# Patient Record
Sex: Male | Born: 1955
Health system: Southern US, Community
[De-identification: ages and names within clinical notes are randomized; demographics above are authoritative.]

## PROBLEM LIST (undated history)

## (undated) DIAGNOSIS — I219 Acute myocardial infarction, unspecified: Secondary | ICD-10-CM

## (undated) DIAGNOSIS — K766 Portal hypertension: Secondary | ICD-10-CM

## (undated) DIAGNOSIS — Z955 Presence of coronary angioplasty implant and graft: Secondary | ICD-10-CM

## (undated) DIAGNOSIS — E119 Type 2 diabetes mellitus without complications: Secondary | ICD-10-CM

## (undated) DIAGNOSIS — E785 Hyperlipidemia, unspecified: Secondary | ICD-10-CM

## (undated) DIAGNOSIS — Q211 Atrial septal defect: Secondary | ICD-10-CM

## (undated) DIAGNOSIS — M1990 Unspecified osteoarthritis, unspecified site: Secondary | ICD-10-CM

## (undated) DIAGNOSIS — K649 Unspecified hemorrhoids: Secondary | ICD-10-CM

## (undated) DIAGNOSIS — K7290 Hepatic failure, unspecified without coma: Secondary | ICD-10-CM

## (undated) DIAGNOSIS — K7460 Unspecified cirrhosis of liver: Secondary | ICD-10-CM

## (undated) DIAGNOSIS — K8020 Calculus of gallbladder without cholecystitis without obstruction: Secondary | ICD-10-CM

## (undated) DIAGNOSIS — I8500 Esophageal varices without bleeding: Secondary | ICD-10-CM

## (undated) DIAGNOSIS — K7682 Hepatic encephalopathy: Secondary | ICD-10-CM

## (undated) DIAGNOSIS — I5031 Acute diastolic (congestive) heart failure: Secondary | ICD-10-CM

## (undated) DIAGNOSIS — I471 Supraventricular tachycardia, unspecified: Secondary | ICD-10-CM

## (undated) DIAGNOSIS — Z973 Presence of spectacles and contact lenses: Secondary | ICD-10-CM

## (undated) DIAGNOSIS — I2510 Atherosclerotic heart disease of native coronary artery without angina pectoris: Secondary | ICD-10-CM

## (undated) DIAGNOSIS — S52502A Unspecified fracture of the lower end of left radius, initial encounter for closed fracture: Secondary | ICD-10-CM

## (undated) HISTORY — PX: FRACTURE SURGERY: SHX138

## (undated) HISTORY — DX: Acute diastolic (congestive) heart failure: I50.31

## (undated) HISTORY — DX: Hyperlipidemia, unspecified: E78.5

## (undated) HISTORY — PX: OTHER SURGICAL HISTORY: SHX169

## (undated) HISTORY — DX: Esophageal varices without bleeding: I85.00

## (undated) HISTORY — DX: Calculus of gallbladder without cholecystitis without obstruction: K80.20

## (undated) HISTORY — DX: Atherosclerotic heart disease of native coronary artery without angina pectoris: I25.10

## (undated) HISTORY — DX: Supraventricular tachycardia, unspecified: I47.10

## (undated) HISTORY — DX: Type 2 diabetes mellitus without complications: E11.9

## (undated) HISTORY — DX: Supraventricular tachycardia: I47.1

## (undated) HISTORY — PX: CORONARY ANGIOPLASTY: SHX604

## (undated) HISTORY — PX: APPENDECTOMY: SHX54

## (undated) HISTORY — DX: Unspecified cirrhosis of liver: K74.60

## (undated) HISTORY — DX: Portal hypertension: K76.6

## (undated) HISTORY — DX: Unspecified hemorrhoids: K64.9

---

## 2000-01-21 ENCOUNTER — Encounter: Admission: RE | Admit: 2000-01-21 | Discharge: 2000-03-17 | Payer: Self-pay | Admitting: Specialist

## 2004-12-25 ENCOUNTER — Emergency Department (HOSPITAL_COMMUNITY): Admission: EM | Admit: 2004-12-25 | Discharge: 2004-12-25 | Payer: Self-pay | Admitting: Emergency Medicine

## 2009-07-11 DIAGNOSIS — E119 Type 2 diabetes mellitus without complications: Secondary | ICD-10-CM

## 2009-07-11 DIAGNOSIS — K7460 Unspecified cirrhosis of liver: Secondary | ICD-10-CM

## 2009-07-11 HISTORY — DX: Type 2 diabetes mellitus without complications: E11.9

## 2009-07-11 HISTORY — DX: Unspecified cirrhosis of liver: K74.60

## 2010-06-10 DIAGNOSIS — Z955 Presence of coronary angioplasty implant and graft: Secondary | ICD-10-CM

## 2010-06-10 HISTORY — DX: Presence of coronary angioplasty implant and graft: Z95.5

## 2010-06-30 ENCOUNTER — Inpatient Hospital Stay (HOSPITAL_COMMUNITY)
Admission: EM | Admit: 2010-06-30 | Discharge: 2010-07-19 | Payer: Self-pay | Source: Home / Self Care | Attending: Cardiovascular Disease | Admitting: Cardiovascular Disease

## 2010-06-30 ENCOUNTER — Encounter: Payer: Self-pay | Admitting: Cardiology

## 2010-06-30 HISTORY — PX: CORONARY STENT PLACEMENT: SHX1402

## 2010-07-01 ENCOUNTER — Encounter: Payer: Self-pay | Admitting: Internal Medicine

## 2010-07-01 HISTORY — PX: CARDIAC CATHETERIZATION: SHX172

## 2010-07-03 ENCOUNTER — Encounter: Payer: Self-pay | Admitting: Internal Medicine

## 2010-07-04 ENCOUNTER — Encounter: Payer: Self-pay | Admitting: Internal Medicine

## 2010-07-05 ENCOUNTER — Encounter: Payer: Self-pay | Admitting: Internal Medicine

## 2010-07-06 ENCOUNTER — Encounter: Payer: Self-pay | Admitting: Internal Medicine

## 2010-07-06 ENCOUNTER — Encounter: Payer: Self-pay | Admitting: Gastroenterology

## 2010-07-07 ENCOUNTER — Encounter: Payer: Self-pay | Admitting: Internal Medicine

## 2010-07-09 ENCOUNTER — Encounter: Payer: Self-pay | Admitting: Pulmonary Disease

## 2010-07-10 ENCOUNTER — Encounter: Payer: Self-pay | Admitting: Internal Medicine

## 2010-07-11 ENCOUNTER — Encounter: Payer: Self-pay | Admitting: Internal Medicine

## 2010-07-12 ENCOUNTER — Encounter: Payer: Self-pay | Admitting: Internal Medicine

## 2010-07-12 ENCOUNTER — Encounter (INDEPENDENT_AMBULATORY_CARE_PROVIDER_SITE_OTHER): Payer: Self-pay | Admitting: *Deleted

## 2010-07-13 ENCOUNTER — Encounter: Payer: Self-pay | Admitting: Internal Medicine

## 2010-07-14 LAB — CBC
HCT: 34 % — ABNORMAL LOW (ref 39.0–52.0)
Hemoglobin: 10.4 g/dL — ABNORMAL LOW (ref 13.0–17.0)
MCH: 30.3 pg (ref 26.0–34.0)
MCHC: 30.6 g/dL (ref 30.0–36.0)
MCV: 99.1 fL (ref 78.0–100.0)
Platelets: 143 10*3/uL — ABNORMAL LOW (ref 150–400)
RBC: 3.43 MIL/uL — ABNORMAL LOW (ref 4.22–5.81)
RDW: 16.3 % — ABNORMAL HIGH (ref 11.5–15.5)
WBC: 12.6 10*3/uL — ABNORMAL HIGH (ref 4.0–10.5)

## 2010-07-14 LAB — GLUCOSE, CAPILLARY
Glucose-Capillary: 174 mg/dL — ABNORMAL HIGH (ref 70–99)
Glucose-Capillary: 223 mg/dL — ABNORMAL HIGH (ref 70–99)
Glucose-Capillary: 185 mg/dL — ABNORMAL HIGH (ref 70–99)
Glucose-Capillary: 171 mg/dL — ABNORMAL HIGH (ref 70–99)

## 2010-07-15 LAB — CBC
HCT: 29.2 % — ABNORMAL LOW (ref 39.0–52.0)
Hemoglobin: 9.2 g/dL — ABNORMAL LOW (ref 13.0–17.0)
MCH: 30.9 pg (ref 26.0–34.0)
MCHC: 31.5 g/dL (ref 30.0–36.0)
MCV: 98 fL (ref 78.0–100.0)
Platelets: 95 10*3/uL — ABNORMAL LOW (ref 150–400)
RBC: 2.98 MIL/uL — ABNORMAL LOW (ref 4.22–5.81)
RDW: 16.3 % — ABNORMAL HIGH (ref 11.5–15.5)
WBC: 7 10*3/uL (ref 4.0–10.5)

## 2010-07-15 LAB — BASIC METABOLIC PANEL
BUN: 12 mg/dL (ref 6–23)
BUN: 10 mg/dL (ref 6–23)
CO2: 24 mEq/L (ref 19–32)
CO2: 25 mEq/L (ref 19–32)
Calcium: 8.1 mg/dL — ABNORMAL LOW (ref 8.4–10.5)
Calcium: 7.8 mg/dL — ABNORMAL LOW (ref 8.4–10.5)
Chloride: 117 mEq/L — ABNORMAL HIGH (ref 96–112)
Chloride: 119 mEq/L — ABNORMAL HIGH (ref 96–112)
Creatinine, Ser: 0.69 mg/dL (ref 0.4–1.5)
Creatinine, Ser: 0.75 mg/dL (ref 0.4–1.5)
GFR calc Af Amer: >60 mL/min (ref >60)
GFR calc Af Amer: >60 mL/min (ref >60)
GFR calc non Af Amer: >60 mL/min (ref >60)
GFR calc non Af Amer: >60 mL/min (ref >60)
Glucose, Bld: 161 mg/dL — ABNORMAL HIGH (ref 70–99)
Glucose, Bld: 164 mg/dL — ABNORMAL HIGH (ref 70–99)
Potassium: 3.9 mEq/L (ref 3.5–5.1)
Potassium: 3.3 mEq/L — ABNORMAL LOW (ref 3.5–5.1)
Sodium: 147 mEq/L — ABNORMAL HIGH (ref 135–145)
Sodium: 147 mEq/L — ABNORMAL HIGH (ref 135–145)

## 2010-07-15 LAB — DIFFERENTIAL
Basophils Absolute: 0.1 10*3/uL (ref 0.0–0.1)
Basophils Relative: 1 % (ref 0–1)
Eosinophils Absolute: 0.2 10*3/uL (ref 0.0–0.7)
Eosinophils Relative: 3 % (ref 0–5)
Lymphocytes Relative: 22 % (ref 12–46)
Lymphs Abs: 1.6 10*3/uL (ref 0.7–4.0)
Monocytes Absolute: 0.8 10*3/uL (ref 0.1–1.0)
Monocytes Relative: 11 % (ref 3–12)
Neutro Abs: 4.4 10*3/uL (ref 1.7–7.7)
Neutrophils Relative %: 63 % (ref 43–77)

## 2010-07-15 LAB — GLUCOSE, CAPILLARY
Glucose-Capillary: 155 mg/dL — ABNORMAL HIGH (ref 70–99)
Glucose-Capillary: 147 mg/dL — ABNORMAL HIGH (ref 70–99)
Glucose-Capillary: 152 mg/dL — ABNORMAL HIGH (ref 70–99)

## 2010-07-16 LAB — BASIC METABOLIC PANEL
BUN: 9 mg/dL (ref 6–23)
CO2: 24 mEq/L (ref 19–32)
Calcium: 8 mg/dL — ABNORMAL LOW (ref 8.4–10.5)
Chloride: 112 mEq/L (ref 96–112)
Creatinine, Ser: 0.84 mg/dL (ref 0.4–1.5)
GFR calc Af Amer: >60 mL/min (ref >60)
GFR calc non Af Amer: >60 mL/min (ref >60)
Glucose, Bld: 153 mg/dL — ABNORMAL HIGH (ref 70–99)
Potassium: 3.9 mEq/L (ref 3.5–5.1)
Sodium: 143 mEq/L (ref 135–145)

## 2010-07-16 LAB — GLUCOSE, CAPILLARY
Glucose-Capillary: 147 mg/dL — ABNORMAL HIGH (ref 70–99)
Glucose-Capillary: 150 mg/dL — ABNORMAL HIGH (ref 70–99)

## 2010-07-16 LAB — CBC
HCT: 31.3 % — ABNORMAL LOW (ref 39.0–52.0)
Hemoglobin: 9.7 g/dL — ABNORMAL LOW (ref 13.0–17.0)
MCH: 30.5 pg (ref 26.0–34.0)
MCHC: 31 g/dL (ref 30.0–36.0)
MCV: 98.4 fL (ref 78.0–100.0)
Platelets: 104 10*3/uL — ABNORMAL LOW (ref 150–400)
RBC: 3.18 MIL/uL — ABNORMAL LOW (ref 4.22–5.81)
RDW: 16.2 % — ABNORMAL HIGH (ref 11.5–15.5)
WBC: 8.1 10*3/uL (ref 4.0–10.5)

## 2010-07-16 LAB — AMMONIA: Ammonia: 31 umol/L (ref 11–35)

## 2010-07-19 ENCOUNTER — Encounter (INDEPENDENT_AMBULATORY_CARE_PROVIDER_SITE_OTHER): Payer: Self-pay

## 2010-07-19 ENCOUNTER — Encounter (INDEPENDENT_AMBULATORY_CARE_PROVIDER_SITE_OTHER): Payer: Self-pay | Admitting: *Deleted

## 2010-07-26 LAB — GLUCOSE, CAPILLARY
Glucose-Capillary: 169 mg/dL — ABNORMAL HIGH (ref 70–99)
Glucose-Capillary: 133 mg/dL — ABNORMAL HIGH (ref 70–99)
Glucose-Capillary: 124 mg/dL — ABNORMAL HIGH (ref 70–99)
Glucose-Capillary: 125 mg/dL — ABNORMAL HIGH (ref 70–99)
Glucose-Capillary: 178 mg/dL — ABNORMAL HIGH (ref 70–99)
Glucose-Capillary: 204 mg/dL — ABNORMAL HIGH (ref 70–99)
Glucose-Capillary: 165 mg/dL — ABNORMAL HIGH (ref 70–99)
Glucose-Capillary: 149 mg/dL — ABNORMAL HIGH (ref 70–99)
Glucose-Capillary: 160 mg/dL — ABNORMAL HIGH (ref 70–99)
Glucose-Capillary: 184 mg/dL — ABNORMAL HIGH (ref 70–99)
Glucose-Capillary: 152 mg/dL — ABNORMAL HIGH (ref 70–99)
Glucose-Capillary: 162 mg/dL — ABNORMAL HIGH (ref 70–99)
Glucose-Capillary: 152 mg/dL — ABNORMAL HIGH (ref 70–99)

## 2010-07-26 LAB — CBC
HCT: 30.6 % — ABNORMAL LOW (ref 39.0–52.0)
HCT: 28.1 % — ABNORMAL LOW (ref 39.0–52.0)
Hemoglobin: 9.5 g/dL — ABNORMAL LOW (ref 13.0–17.0)
Hemoglobin: 9 g/dL — ABNORMAL LOW (ref 13.0–17.0)
MCH: 30.2 pg (ref 26.0–34.0)
MCH: 30.6 pg (ref 26.0–34.0)
MCHC: 31 g/dL (ref 30.0–36.0)
MCHC: 32 g/dL (ref 30.0–36.0)
MCV: 97.1 fL (ref 78.0–100.0)
MCV: 95.6 fL (ref 78.0–100.0)
Platelets: 111 10*3/uL — ABNORMAL LOW (ref 150–400)
Platelets: 100 10*3/uL — ABNORMAL LOW (ref 150–400)
RBC: 3.15 MIL/uL — ABNORMAL LOW (ref 4.22–5.81)
RBC: 2.94 MIL/uL — ABNORMAL LOW (ref 4.22–5.81)
RDW: 15.9 % — ABNORMAL HIGH (ref 11.5–15.5)
RDW: 15.1 % (ref 11.5–15.5)
WBC: 7.7 10*3/uL (ref 4.0–10.5)
WBC: 4.2 10*3/uL (ref 4.0–10.5)

## 2010-07-26 LAB — BASIC METABOLIC PANEL
BUN: 9 mg/dL (ref 6–23)
BUN: 7 mg/dL (ref 6–23)
BUN: 4 mg/dL — ABNORMAL LOW (ref 6–23)
CO2: 23 mEq/L (ref 19–32)
CO2: 22 mEq/L (ref 19–32)
CO2: 25 mEq/L (ref 19–32)
Calcium: 7.7 mg/dL — ABNORMAL LOW (ref 8.4–10.5)
Calcium: 8 mg/dL — ABNORMAL LOW (ref 8.4–10.5)
Calcium: 7.9 mg/dL — ABNORMAL LOW (ref 8.4–10.5)
Chloride: 107 mEq/L (ref 96–112)
Chloride: 111 mEq/L (ref 96–112)
Chloride: 107 mEq/L (ref 96–112)
Creatinine, Ser: 0.8 mg/dL (ref 0.4–1.5)
Creatinine, Ser: 0.69 mg/dL (ref 0.4–1.5)
Creatinine, Ser: 0.75 mg/dL (ref 0.4–1.5)
GFR calc Af Amer: >60 mL/min (ref >60)
GFR calc Af Amer: >60 mL/min (ref >60)
GFR calc Af Amer: >60 mL/min (ref >60)
GFR calc non Af Amer: >60 mL/min (ref >60)
GFR calc non Af Amer: >60 mL/min (ref >60)
GFR calc non Af Amer: >60 mL/min (ref >60)
Glucose, Bld: 135 mg/dL — ABNORMAL HIGH (ref 70–99)
Glucose, Bld: 169 mg/dL — ABNORMAL HIGH (ref 70–99)
Glucose, Bld: 153 mg/dL — ABNORMAL HIGH (ref 70–99)
Potassium: 3.9 mEq/L (ref 3.5–5.1)
Potassium: 3.8 mEq/L (ref 3.5–5.1)
Potassium: 3.9 mEq/L (ref 3.5–5.1)
Sodium: 136 mEq/L (ref 135–145)
Sodium: 141 mEq/L (ref 135–145)
Sodium: 138 mEq/L (ref 135–145)

## 2010-08-02 ENCOUNTER — Ambulatory Visit
Admission: RE | Admit: 2010-08-02 | Discharge: 2010-08-02 | Payer: Self-pay | Source: Home / Self Care | Attending: Cardiology | Admitting: Cardiology

## 2010-08-02 ENCOUNTER — Encounter: Payer: Self-pay | Admitting: Cardiology

## 2010-08-02 DIAGNOSIS — E785 Hyperlipidemia, unspecified: Secondary | ICD-10-CM | POA: Insufficient documentation

## 2010-08-02 DIAGNOSIS — I2510 Atherosclerotic heart disease of native coronary artery without angina pectoris: Secondary | ICD-10-CM | POA: Insufficient documentation

## 2010-08-02 DIAGNOSIS — I5032 Chronic diastolic (congestive) heart failure: Secondary | ICD-10-CM | POA: Insufficient documentation

## 2010-08-03 ENCOUNTER — Encounter: Payer: Self-pay | Admitting: Cardiology

## 2010-08-04 ENCOUNTER — Encounter: Admission: RE | Admit: 2010-08-04 | Payer: Self-pay | Source: Home / Self Care | Admitting: Dermatopathology

## 2010-08-04 LAB — CONVERTED CEMR LAB
AST: 33 units/L (ref 0–37)
Albumin: 3.5 g/dL (ref 3.5–5.2)
Alkaline Phosphatase: 129 units/L — ABNORMAL HIGH (ref 39–117)
Bilirubin, Direct: 0.1 mg/dL (ref 0.0–0.3)
CO2: 22 meq/L (ref 19–32)
Calcium: 9.3 mg/dL (ref 8.4–10.5)
Chloride: 97 meq/L (ref 96–112)
Creatinine, Ser: 0.83 mg/dL (ref 0.40–1.50)
Indirect Bilirubin: 0.5 mg/dL (ref 0.0–0.9)
Sodium: 130 meq/L — ABNORMAL LOW (ref 135–145)
Total Bilirubin: 0.6 mg/dL (ref 0.3–1.2)

## 2010-08-06 NOTE — H&P (Signed)
Adam Butler, Adam Butler             ACCOUNT NO.:  [id number]  MEDICAL RECORD NO.:  [id number]          PATIENT TYPE:  INP  LOCATION:  2912                         FACILITY:  [facility]  PHYSICIAN:  Kathlyn Sacramento, MD     DATE OF BIRTH:  1956/03/13  DATE OF ADMISSION:  06/30/2010 DATE OF DISCHARGE:                             HISTORY & PHYSICAL   PRIMARY CARDIOLOGIST:  New patient to [facility].  PRIMARY CARE PHYSICIAN:  None.  CHIEF COMPLAINT:  Chest pain.  HISTORY OF PRESENT ILLNESS:  This is a 55 year old Caucasian gentleman with no known medical history or no primary care physician who states he was in his usual state of health until last evening when he began to have fatigue, chills, and cough.  He also felt weakness.  This morning around 8:00 a.m., the patient had an additional symptom of left-sided chest pain.  He presented to the emergency department with his wife where he was found to be tachycardic with heart rates in the 150s.  An EKG was obtained that showed inferior ST elevations.  A code STEMI was initiated and the patient was brought emergently to the cath lab.  PAST MEDICAL HISTORY:  None.  PAST SURGICAL HISTORY: 1. Right leg with titanium rod after a fall. 2. Status post appendectomy.  SOCIAL HISTORY:  The patient lives with his wife.  He is currently unemployed.  He has greater than 45-pack-year smoking history and continues to smoke 1 pack per day.  He has an occasional alcohol use. He denies any illicit drug use.  He has no regular exercise program.  FAMILY HISTORY:  Pertinent for early coronary artery disease in his two brothers, both in their 2s.  His father also has coronary artery disease and status post coronary artery bypass grafting but this is later in life.  ALLERGIES:  No known drug allergies.  HOME MEDICATIONS: 1. Aspirin. 2. Multivitamin. 3. Potassium for leg cramps.  REVIEW OF SYSTEMS:  All pertinent positives as stated in HPI.  The patient  denies any nausea or vomiting.  All other systems have been reviewed and are negative.  CODE STATUS:  Full.  PHYSICAL EXAMINATION:  VITAL SIGNS:  Temperature 97.3, pulse 92, respirations 20, and blood pressure 132/121. GENERAL:  This is an anxious-appearing gentleman.  He does appear older than his stated age.  He is mildly tachypneic with conversation. HEENT:  Normal. NECK:  Supple. HEART:  Regular rate and rhythm with S1-S2. LUNGS:  Clear to auscultation anteriorly. ABDOMEN:  Soft, nontender, positive bowel sounds x4. EXTREMITIES:  No clubbing, cyanosis, or edema. MUSCULOSKELETAL:  No joint deformities. NEURO:  Alert and oriented x3, cranial nerves II through XII grossly intact.  IMAGING:  Chest x-ray is pending.  EKG showing normal sinus rhythm at a rate of 88 beats per minute.  There are inferior ST elevations as well as V4-V6.  AVL, V1 and V2 with ST depressions.  WBC 25.5, hemoglobin 13.3, hematocrit 39, and platelet 216.  Sodium 135, potassium 5.1, chloride 103, bicarb 71, BUN 20, creatinine 0.85, and glucose 401.  Total bilirubin 1.6, alkaline phosphatase 105, AST 46, ALT 229, total protein 6.5,  and albumin 2.8. Point-of-care markers negative x1.  ASSESSMENT AND PLAN:  This is a 55 year old gentleman with no known medical history who presents with chest pain and was found to have ST elevations on his EKG.  A code STEMI has been initiated and the patient has been taken emergently to the cath lab by Dr. Fletcher Anon.  Informed consent was obtained.  The patient has also been involved in the [facility] [place] trial.  Upon catheterization, the patient became hypotensive and he has received fluids.  Therefore, antihypertensive medicines will be held until this resolves.  The patient has been found to have elevated glucose, greater than 400.  Therefore, the patient will be placed on an insulin drip after catheterization.  He is to be taken to the ICU for routine monitoring.  With  the patient's elevated white blood cell count, we will also obtain urine cultures and a chest x-ray.  The patient will be placed on a statin and fasting lipids will be drawn in the morning.     Adam Amsterdam, PA-C   ______________________________ Kathlyn Sacramento, MD    NB/MEDQ  D:  06/30/2010  T:  07/01/2010  Job:  [id number]  Electronically Signed by Pennie Rushing P.A. on 07/23/2010 10:19:09 AM Electronically Signed by Kathlyn Sacramento MD on 08/04/2010 10:41:33 AM

## 2010-08-09 ENCOUNTER — Telehealth: Payer: Self-pay | Admitting: Cardiology

## 2010-08-12 NOTE — Miscellaneous (Signed)
Summary: Appointment  Clinical Lists Changes Appointment made for patient for hospital f/u with Sarah PA for 08/16/10@2 :15pm.  Appended Document: Appointment Appt with Dr. Deatra Ina, appointment scheduled with Judson Roch PA.

## 2010-08-12 NOTE — Assessment & Plan Note (Signed)
Summary: F/U FROM HOSPITAL/SAB   Visit Type:  Initial Consult Primary Provider:  Sami Hassan,M.D.  [facility]  CC:  s/p stent at [facility].  "Doing well since the discharge"..  History of Present Illness: 55 yo with recent complicated admission to [facility].  He entered the hospital with an inferior MI and had a bare metal stent to an occluded circumflex.  His hospitalization was then complicated by GI bleeding requiring transfusion and altered mental status thought to be due to hepatic encephalopathy.  He was intubated due to altered mental status to protect his airway.  He developed ventilator-associated PNA and CHF, requiring antibiotics and diuresis.  He was noted to have findings consistent with cirrhosis on imaging, but family/patient denied ETOH and viral hepatitis workup was unrevealing.  He was finally discharged home after a prolonged course.    At home, patient seems to be doing quite well.  He has had no recurrent chest pain. No exertional dyspnea.  He is able to climb a flight of steps with no problem.  No melena or bright red blood per rectum.  His lower extremity edema has resolved and he has no abdominal distention.  He has quit smoking.  He is going to see Dr. Deatra Ina for GI followup in early February.  Per his wife, mental status has been quite clear since discharge.  He is still taking lactulose.   ECG: NSR, LAD, old inferior MI.   Labs (12/11): HBsAg negative, HAV ab negative, HCV ab negative, HBcAb negative, HVC RNA negative, ceruloplasmin level normal, ANA negative, anti-mitochondrial ab level elevated.  Labs (1/12): K 3.9, creatinine 0.75, HCT 28.1, AST 40, ALT 29 B  Preventive Screening-Counseling & Management  Alcohol-Tobacco     Smoking Status: quit  Caffeine-Diet-Exercise     Does Patient Exercise: yes  Current Medications (verified): 1)  Aspir-Low 81 Mg Tbec (Aspirin) .Marland Kitchen.. 1 Tablet Daily 2)  Plavix 75 Mg Tabs (Clopidogrel Bisulfate) .Marland Kitchen.. 1  Tablet Daily 3)  Furosemide 40 Mg Tabs (Furosemide) .Marland Kitchen.. 1 Tablet Daily 4)  Insulin .... As Directed 5)  Ipratropium-Albuterol 0.5-2.5 (3) Mg/72m Soln (Ipratropium-Albuterol) ..Marland Kitchen. 1 Puff Inhaled Every 2 Hours As Needed 6)  Lactulose 10 Gm/167mSoln (Lactulose) .... By Mouth Daily 7)  Nitrostat 0.4 Mg Subl (Nitroglycerin) .... As Directed 8)  Nadolol 20 Mg Tabs (Nadolol) .... 1/2 Tablet By Mouth Daily 9)  Protonix 40 Mg Tbec (Pantoprazole Sodium) ...Marland Kitchen 1 Tablet By Mouth Daily 10)  Spironolactone 50 Mg Tabs (Spironolactone) ...Marland Kitchen 1 Tablet By Mouth Two Times A Day 11)  Multivitamins  Tabs (Multiple Vitamin) ...Marland Kitchen 1 Tablet Daily 12)  Metformin Hcl 500 Mg Tabs (Metformin Hcl) .... One Tablet  Two Times A Day  Allergies (verified): No Known Drug Allergies  Past History:  Past Surgical History: Last updated: 01Feb 08, 2012eart Catheterization-07/01/2010  Family History: Last updated: 01Feb 08, 2012ather:Deceased age 6374CABG age 2536Mother:Living. Siblings: 3 brothers & 1 sister living. 1 brother MI age 55 1rother MI age 3326Social History: Last updated: 012012-02-08nemployed, worked in maintenance prior.  Married  Tobacco Use - Former. Quit Dec. 21, 2011. Smoked x 36 years. Alcohol Use - no Regular Exercise - yes--walk  Risk Factors: Exercise: yes (101-02-19 Risk Factors: Smoking Status: quit (101/02/19 Past Medical History: 1. CAD: Inferior MI 12/11.  LHC with occluded mid CFX and 80% proximal RCA.  EF 55%.  He had 3.0 x 28 VIsion BMS to CFX.  2. Diastolic CHF: Echo (  12/11) with EF 50-55% and mild LVH.  EF 55% by LV-gram in 12/11.  3. Hypertension 4. Type II diabetes 5. Cirrhosis: Cryptogenic, ? NASH.  Family and patient deny ETOH.  HCV, HBV, and HAV workup negative.  ANA negative.  AMA positive.  He had grade II esophageal varices on EGD with no evidence for bleeding in 12/11.  He had hepatic encephalopaty and ascites in 12/11.  6. SVT (1/12): appeared to be an ectopic  atrial tachycardia.  Required DCCV with hemodynamic instability.  7. GI bleed (12/11): Etiology not clearly defined.  EGD showed nonbleeding esophageal varices.   Family History: Reviewed history and no changes required. Father:Deceased age 20. CABG age 48. Mother:Living. Siblings: 3 brothers & 1 sister living. 1 brother MI age 7 1 brother MI age 24  Social History: Unemployed, worked in Theatre manager prior.  Married  Tobacco Use - Former. Quit Dec. 21, 2011. Smoked x 36 years. Alcohol Use - no Regular Exercise - yes--walk Smoking Status:  quit Does Patient Exercise:  yes  Review of Systems       All systems reviewed and negative except as per HPI.   Vital Signs:  Patient profile:   55 year old male Height:      67 inches Weight:      211 pounds BMI:     33.17 Pulse rate:   69 / minute BP sitting:   128 / 80  (left arm) Cuff size:   regular  Vitals Entered By: Dolores Lory, CMA (August 02, 2010 2:55 PM)  Physical Exam  General:  Well developed, well nourished, in no acute distress. Head:  normocephalic and atraumatic Nose:  no deformity, discharge, inflammation, or lesions Mouth:  Teeth, gums and palate normal. Oral mucosa normal. Neck:  Neck supple, no JVD. No masses, thyromegaly or abnormal cervical nodes. Lungs:  Breath sounds are slightly distant.  Heart:  Non-displaced PMI, chest non-tender; regular rate and rhythm, S1, S2 without murmurs, rubs or gallops. Carotid upstroke normal, no bruit.  Pedals normal pulses. No edema, no varicosities. Abdomen:  Bowel sounds positive; abdomen soft and non-tender without masses, organomegaly, or hernias noted. No hepatosplenomegaly. No distention.  Extremities:  No clubbing or cyanosis. Neurologic:  Alert and oriented x 3. Skin:  Intact without lesions or rashes. Psych:  Normal affect.   Impression & Recommendations:  Problem # 1:  CAD, NATIVE VESSEL (ICD-414.01) Stable with no exertional chest pain or dyspnea.  EF was  preserved on echo.  No overt bleeding since hospital discharge.  He needs to remain on Plavix for at least a month post-BMS.  Ideally, he would continue on it for a year.  I will have to see how he does on it.  He had a residual 80% RCA stenosis that was untreated.  I will arrange for an ETT-myoview in about 2 weeks to assess for RCA-territory ischemia.  He is uninsured, so I will attempt to get him coverage for this through the [facility] care provision.  He will also continue on ASA 81 mg daily, nadolol (nonspecific beta blocker given varices).  I am going to start him on a low dose of statin (pravastatin 20 mg daily) and follow LFTs closely. I am also going to start him on a low dose of ACEI for secondary prevention. I am going to set him up for cardiac rehab, he wishes to do this at [facility].   Problem # 2:  DIASTOLIC HEART FAILURE, CHRONIC (ICD-428.32) Preserved LV systolic  function post-MI.  He had significant volume overload with some ascites while in the hospital.  He is now on Lasix and spironolactone.  He does not have any pedal edema or significant abdominal distention.  Neck veins are not elevated.  Continue current Lasix and spironolactone for now.  Will get BMET.    Problem # 3:  HYPERLIPIDEMIA-MIXED (ICD-272.4) I will start a low dose of statin for secondary prevention of MI (pravastatin 20 mg daily).  I will follow LFTs closely. Check LFTs today and in 1 month.  Check lipids/LFTs in 2 months.  If stable with this dose of pravastatin, will increase dose.    Problem # 4:  CIRRHOSIS Cryptogenic.  ? significance of positive anti-mitochondrial antibody.  He is to followup with GI soon.   Will need to follow him closely, will see in 1 month.   Other Orders: T-Basic Metabolic Panel ([id number]) T-Hepatic Function [phone])  Patient Instructions: 1)  Your physician recommends that you schedule a follow-up appointment in: 1 month  2)  TO BE SCHEDULED IN 2 MONTHS:  Your  physician recommends that you return for a FASTING lipid profile: (LIVER, LIPID) 3)  Your physician has recommended you make the following change in your medication: START Lisinopril 2.53m once daily. START Pravachol 247monce daily. 4)  Your physician has requested that you have an exercise stress myoview.  For further information please visit [email or web address] Please follow instruction sheet, as given. We will call you to schedule this. 5)  TO BE SCHEDULED IN 1 MONTH:  Your physician recommends that you return for lab work in: (LFT only) Prescriptions: PRAVACHOL 20 MG TABS (PRAVASTATIN SODIUM) Take one tablet by mouth once daily.  #30 x 6   Entered by:   MeDarlyne RussianN   Authorized by:   DaLoralie ChampagneMD   Signed by:   MeDarlyne RussianN on 08/02/2010   Method used:   [facility]        [phone]       [place]  [id number]     Ph: [phone]     Fax: [phone] RxID: :   [id number] 2.5 MG TABS (LISINOPRIL) Take one tablet by mouth daily  #30 x 6   Entered by:   MeDarlyne RussianN   Authorized by:   DaLoralie ChampagneMD   Signed by:   MeDarlyne RussianN on 08/02/2010   Method used:   [facility]        [phone]([facility]ail)       [address]     [place]  [id number]     Ph: [phone]     Fax: [phone] RxID:   [id number]

## 2010-08-12 NOTE — Letter (Signed)
Summary: Cardiac Rehab  Program  Cardiac Rehab  Program   Imported By: Marilynne Drivers 08/03/2010 09:17:03  _____________________________________________________________________  External Attachment:    Type:   Image     Comment:   External Document

## 2010-08-16 ENCOUNTER — Other Ambulatory Visit: Payer: Self-pay

## 2010-08-16 ENCOUNTER — Encounter (INDEPENDENT_AMBULATORY_CARE_PROVIDER_SITE_OTHER): Payer: Self-pay | Admitting: *Deleted

## 2010-08-16 ENCOUNTER — Ambulatory Visit (INDEPENDENT_AMBULATORY_CARE_PROVIDER_SITE_OTHER): Payer: Self-pay | Admitting: Gastroenterology

## 2010-08-16 ENCOUNTER — Other Ambulatory Visit: Payer: Self-pay | Admitting: Gastroenterology

## 2010-08-16 ENCOUNTER — Encounter: Payer: Self-pay | Admitting: Gastroenterology

## 2010-08-16 DIAGNOSIS — I8500 Esophageal varices without bleeding: Secondary | ICD-10-CM | POA: Insufficient documentation

## 2010-08-16 DIAGNOSIS — K7460 Unspecified cirrhosis of liver: Secondary | ICD-10-CM

## 2010-08-16 LAB — COMPREHENSIVE METABOLIC PANEL
ALT: 34 U/L (ref 0–53)
AST: 41 U/L — ABNORMAL HIGH (ref 0–37)
Albumin: 3.4 g/dL — ABNORMAL LOW (ref 3.5–5.2)
Creatinine, Ser: 0.9 mg/dL (ref 0.4–1.5)
Potassium: 4.6 mEq/L (ref 3.5–5.1)

## 2010-08-16 LAB — CBC WITH DIFFERENTIAL/PLATELET
Basophils Absolute: 0 10*3/uL (ref 0.0–0.1)
Basophils Relative: 0.8 % (ref 0.0–3.0)
Eosinophils Absolute: 0.2 10*3/uL (ref 0.0–0.7)
Eosinophils Relative: 2.7 % (ref 0.0–5.0)
Lymphocytes Relative: 30.1 % (ref 12.0–46.0)
Lymphs Abs: 1.7 10*3/uL (ref 0.7–4.0)
Monocytes Absolute: 0.6 10*3/uL (ref 0.1–1.0)
Monocytes Relative: 10.7 % (ref 3.0–12.0)
Neutro Abs: 3.2 10*3/uL (ref 1.4–7.7)
Neutrophils Relative %: 55.7 % (ref 43.0–77.0)
RDW: 16.5 % — ABNORMAL HIGH (ref 11.5–14.6)

## 2010-08-16 LAB — FOLATE: Folate: >24.8 ng/mL (ref >5.9)

## 2010-08-16 LAB — IRON: Iron: 74 ug/dL (ref 42–165)

## 2010-08-16 LAB — IBC PANEL
Saturation Ratios: 16.8 % — ABNORMAL LOW (ref 20.0–50.0)
Transferrin: 314.5 mg/dL (ref 212.0–360.0)

## 2010-08-16 LAB — PROTIME-INR
INR: 1.1 ratio — ABNORMAL HIGH (ref 0.8–1.0)
Prothrombin Time: 12.2 s (ref 10.2–12.4)

## 2010-08-16 LAB — FERRITIN: Ferritin: 18.7 ng/mL — ABNORMAL LOW (ref 22.0–322.0)

## 2010-08-18 ENCOUNTER — Telehealth: Payer: Self-pay | Admitting: Cardiology

## 2010-08-18 NOTE — Progress Notes (Signed)
Summary: Plavix  Phone Note Call from Patient Call back at [phone]   Caller: Patient Call For: Nurse/Plavix Summary of Call: Pt called stating he has 1 tablet left of Plavix from our samples. Pt has sent in pt assistance form for Plavix and they will be shipped to our office in 10 - 14 business days. Called rep to see if she can bring in samples of Plavix and she will be in tomorrow at 10. Initial call taken by: Rodman Comp CMA,  August 09, 2010 8:40 AM  Follow-up for Phone Call        pt's wife notified per [facility]. Follow-up by: Clarise Cruz Burress CMA,  August 09, 2010 8:40 AM

## 2010-08-26 ENCOUNTER — Encounter: Payer: Self-pay | Admitting: Gastroenterology

## 2010-08-26 LAB — CONVERTED CEMR LAB
Hep B S Ab: NEGATIVE
Hepatitis B Surface Ag: NEGATIVE

## 2010-08-26 NOTE — Progress Notes (Signed)
Summary: Pt assistance   ---- Converted from flag ---- ---- 08/03/2010 10:01 AM, Darlyne Russian RN wrote: Mailed pt assistance forms to pt 08/03/10. Pt will fill out and mail to [facility], when I hear results will schedule Treadmill Myoview ordered by Dr. Aundra Dubin. Follow up on status. ------------------------------  Spoke to pt, they have received pt assistance forms for [facility] and will notify us when they have been sent in and will f/u on status. Once approved, will schedule pt for exercise myoview per Dr. Aundra Dubin. Pt has Plavix assistance forms and will send in.

## 2010-08-26 NOTE — Op Note (Signed)
Summary: EGD (Dr Olevia Perches)  [address] [facility] [place], [place] [address] ENDOSCOPY PROCEDURE REPORT PATIENT: Adam Butler, Adam Butler MR#: [id number] BIRTHDATE: 17-Jan-1956 GENDER: male ENDOSCOPIST: Lowella Bandy. Olevia Perches, MD Referred by: Dola Argyle, M.D. PROCEDURE DATE: 07/06/2010 PROCEDURE: EGD, diagnostic 43235 ASA CLASS: Class III INDICATIONS: anemia, melenic bleeding dark liquid stools today, Hgb 8.3, pt on Plavix, no prior GI history, s/p small bowl resection as a child MEDICATIONS: None mg IV TOPICAL ANESTHETIC: none DESCRIPTION OF PROCEDURE: After the risks benefits and alternatives of the procedure were thoroughly explained, informed consent was obtained. The Pentax Gastroscope [id number] endoscope was introduced through the mouth and advanced to the second portion of the duodenum, without limitations. The instrument was slowly withdrawn as the mucosa was fully examined. [place] [address] [place], [place] [address] Grade II varices were found in the distal esophagus (see image1, image2, image8, image7, image9, image10, image11, and image12). large varices, 3 strands, no stigmata of recent bleeding Gastropathy was found (see image6 and image3). portal hypertensive gastropathy, no bleeding no gastric varices Otherwise the examination was normal (see image4 and image5). normal duodenum, no varices, no blood in the UGI tract Retroflexed views revealed no abnormalities. The scope was then withdrawn from the patient and the procedure completed. COMPLICATIONS: None Page 2 of 3 [place], [place], [phone] [address] [facility] [place], [place] [address] ENDOSCOPIC IMPRESSION: 1) Grade II varices in the distal esophagus 2) Gastropathy 3) Otherwise normal examination no active or recent bleeding from UGI tract RECOMMENDATIONS: assume upper GI bleed, consider tagged RBC pool scan REPEAT EXAM: In 0 year(s) for. may need varuiceal  banding in case of an UGI bleed _______________________________ Lowella Bandy. Olevia Perches, MD CC: n. eSIGNED: Lowella Bandy. Brodie at 07/06/2010 05:44 PM Page 3 of [place], [phone]

## 2010-08-26 NOTE — Assessment & Plan Note (Addendum)
Summary: HOSPITAL F/U CIRRHOSIS/LRH   NO SHOW/CPAY    SCHED W SARAH-PA   History of Present Illness Primary GI MD: Erskine Emery MD [facility] Primary Provider: Sami Hassan,M.D.  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 Pt denies any abd pain but has intermittant nausea and acid reflux. History of Present Illness:   [name] is a 55 year old white male with cirrhosis here for followup after hospital discharge in December, 2011 for delirium, acute MI and respiratory failure.  In this setting he developed delirium felt perhaps   due to hepatic encephalopathy.  It was later determined that this was more likely a metabolic encephalopathy.  He developed ascites and ultrasound demonstrated changes suggestive of cirrhosis.  Upper endoscopy demonstrated grade 2 varices.  He was placed on nadolol.  There is no history of alcohol abuse, blood transfusions or hepatitis.  Since discharge he has done fairly well.  He has no specific GI complaints.   GI Review of Systems    Reports nausea.      Denies abdominal pain, acid reflux, belching, bloating, chest pain, dysphagia with liquids, dysphagia with solids, heartburn, loss of appetite, vomiting, vomiting blood, weight loss, and  weight gain.        Denies anal fissure, black tarry stools, change in bowel habit, constipation, diarrhea, diverticulosis, fecal incontinence, heme positive stool, hemorrhoids, irritable bowel syndrome, jaundice, light color stool, liver problems, rectal bleeding, and  rectal pain.    Current Medications (verified): 1)  Aspir-Low 81 Mg Tbec (Aspirin) .Marland Kitchen.. 1 Tablet Daily 2)  Plavix 75 Mg Tabs (Clopidogrel Bisulfate) .Marland Kitchen.. 1 Tablet Daily 3)  Furosemide 40 Mg Tabs (Furosemide) .Marland Kitchen.. 1 Tablet Daily 4)  Insulin .... As Directed 5)  Ipratropium-Albuterol 0.5-2.5 (3) Mg/102m Soln (Ipratropium-Albuterol) ..Marland Kitchen. 1 Puff Inhaled Every 2 Hours As Needed 6)  Lactulose 10 Gm/118mSoln (Lactulose) .... By Mouth  Daily 7)  Nitrostat 0.4 Mg Subl (Nitroglycerin) .... As Directed 8)  Nadolol 20 Mg Tabs (Nadolol) .... 1/2 Tablet By Mouth Daily 9)  Protonix 40 Mg Tbec (Pantoprazole Sodium) ...Marland Kitchen 1 Tablet By Mouth Daily 10)  Spironolactone 50 Mg Tabs (Spironolactone) ...Marland Kitchen 1 Tablet By Mouth Two Times A Day 11)  Multivitamins  Tabs (Multiple Vitamin) ...Marland Kitchen 1 Tablet Daily 12)  Metformin Hcl 500 Mg Tabs (Metformin Hcl) .... One Tablet  Two Times A Day 13)  Lisinopril 2.5 Mg Tabs (Lisinopril) .... Take One Tablet By Mouth Daily 14)  Pravachol 20 Mg Tabs (Pravastatin Sodium) .... Take One Tablet By Mouth Once Daily.  Allergies (verified): No Known Drug Allergies  Past History:  Past Medical History: Reviewed history from 08/02/2010 and no changes required. 1. CAD: Inferior MI 12/11.  LHC with occluded mid CFX and 80% proximal RCA.  EF 55%.  He had 3.0 x 28 VIsion BMS to CFX.  2. Diastolic CHF: Echo (1232/44with EF 50-55% and mild LVH.  EF 55% by LV-gram in 12/11.  3. Hypertension 4. Type II diabetes 5. Cirrhosis: Cryptogenic, ? NASH.  Family and patient deny ETOH.  HCV, HBV, and HAV workup negative.  ANA negative.  AMA positive.  He had grade II esophageal varices on EGD with no evidence for bleeding in 12/11.  He had hepatic encephalopaty and ascites in 12/11.  6. SVT (1/12): appeared to be an ectopic atrial tachycardia.  Required DCCV with hemodynamic instability.  7. GI bleed (12/11): Etiology not clearly defined.  EGD showed nonbleeding esophageal varices.   Past Surgical History: Reviewed history from  08/02/2010 and no changes required. Heart Catheterization-07/01/2010  Family History: Reviewed history from 08/02/2010 and no changes required. Father:Deceased age 50. CABG age 76. Mother:Living. Siblings: 3 brothers & 1 sister living. 1 brother MI age 26 1 brother MI age 82  Social History: Reviewed history from 08/02/2010 and no changes required. Unemployed, worked in maintenance prior.  Married   Tobacco Use - Former. Quit Dec. 21, 2011. Smoked x 36 years. Alcohol Use - no Regular Exercise - yes--walk  Review of Systems       All  systems were reviewed and were negative   Vital Signs:  Patient profile:   55 year old male Height:      67 inches Weight:      211 pounds BMI:     33.17 Pulse rate:   78 / minute Pulse rhythm:   regular BP sitting:   104 / 52  (left arm) Cuff size:   regular  Vitals Entered By: Marlon Pel CMA Deborra Medina) (August 16, 2010 2:24 PM)  Physical Exam  Additional Exam:  On physical exam he is a slightly chronically ill-appearing male  skin: anicteric HEENT: normocephalic; PEERLA; no nasal or pharyngeal abnormalities neck: supple nodes: no cervical lymphadenopathy chest: clear to ausculatation and percussion heart: no murmurs, gallops, or rubs abd: soft, nontender; BS normoactive; no abdominal masses, tenderness, organomegaly; abdomen is protuberant.  There may be a small amount of ascites though this is difficult to determine.  It is no obvious fluid wave rectal: deferred ext: no cynanosis, clubbing, edema skeletal: no deformities neuro: oriented x 3; no focal abnormalities    Impression & Recommendations:  Problem # 1:  CIRRHOSIS OF LIVER WITHOUT MENTION OF ALCOHOL (ICD-571.5) The patient appears to have cryptogenic cirrhosis.  This could be on the basis of [place].  Chronic causes of hepatitis including viral hepatitis need to be ruled out.  Recommendations #1 check serologies for hepatitis B and C., iron studies, ceruloplasmin level, a and a an anti-nuclear antibodies #2 continue Aldactone #3 DC lactulose Orders: TLB-CBC Platelet - w/Differential (85025-CBCD) TLB-CMP (Comprehensive Metabolic Pnl) (99833-ASNK) TLB-Ferritin (53976-BHA) TLB-Folic Acid (Folate) (19379-KWI) TLB-Iron, (Fe) Total (83540-FE) TLB-IBC Pnl (Iron/FE;Transferrin) (83550-IBC) T-Alpha-1-Antitrypsin Tot [phone]) T-AMA (42683) T-ANA [phone]) T-Anti  SMA (89211-94174) T-Hepatitis B Surface Antibody (08144-81856) T-Hepatitis B Surface Antigen (31497-02637) T-Hepatitis C Anti HCV (85885) TLB-PT (Protime) (85610-PTP) TLB-PTT (85730-PTTL)  Problem # 2:  ESOPHAGEAL VARICES WITHOUT MENTION OF BLEEDING (ICD-456.1) Plan to continue nadolol.   Followup endoscopy in one year  Problem # 3:  CAD, NATIVE VESSEL (ICD-414.01) Assessment: Comment Only  Patient Instructions: 1)  Copy sent to : Sami Hassan,M.D.  [place] 2)  You will go to thye basement today for labs 3)  The medication list was reviewed and reconciled.  All changed / newly prescribed medications were explained.  A complete medication list was provided to the patient / caregiver.

## 2010-09-01 NOTE — Miscellaneous (Addendum)
  Clinical Lists Changes AMA is positive - pt probably has PBC  Appended Document:  Dr Deatra Ina, do we need to schedule for a f/u with pos. PBC? Thanks, please respond to [place].  Appended Document:  f/u OV 2 months  Appended Document: Follow-up appointment Appointment will be made when April schedule is out.  Appended Document:  Appt made with Dr. Deatra Ina for 10/28/10 @8 :45am

## 2010-09-03 ENCOUNTER — Ambulatory Visit (INDEPENDENT_AMBULATORY_CARE_PROVIDER_SITE_OTHER): Payer: Self-pay | Admitting: Cardiology

## 2010-09-03 ENCOUNTER — Encounter: Payer: Self-pay | Admitting: Cardiology

## 2010-09-03 DIAGNOSIS — E7800 Pure hypercholesterolemia, unspecified: Secondary | ICD-10-CM

## 2010-09-03 DIAGNOSIS — I2510 Atherosclerotic heart disease of native coronary artery without angina pectoris: Secondary | ICD-10-CM

## 2010-09-06 ENCOUNTER — Encounter: Payer: Self-pay | Admitting: Gastroenterology

## 2010-09-08 ENCOUNTER — Encounter: Payer: Self-pay | Admitting: Cardiology

## 2010-09-13 ENCOUNTER — Telehealth: Payer: Self-pay | Admitting: Cardiology

## 2010-09-16 NOTE — Letter (Signed)
Summary: Appt Reminder [facility]  [address]   [place], [place] [address]   Phone: [phone]  Fax: [phone]        September 06, 2010 MRN: [id number]    PRYNCE JACOBER [address] [place], [place]  [address]    Dear Mr. Christopherson,   You have a return appointment with Dr. Deatra Ina on 10/28/10 at 8:45am.  Please remember to bring a complete list of the medicines you are taking, your insurance card and your co-pay.  If you have to cancel or reschedule this appointment, please call before 5:00 pm the evening before to avoid a cancellation fee.  If you have any questions or concerns, please call [phone].    Sincerely,    Rosanne Sack RN  Appended Document: Appt Reminder 2 Letter is mailed to the patient's home address

## 2010-09-16 NOTE — Assessment & Plan Note (Signed)
Summary: F1M/AMD   Visit Type:  Follow-up Primary Provider:  Sami Hassan,M.D.  [place]  CC:  c/o chest pain with anxiety. Has a pain that shoots down the back of right leg.Adam Butler  History of Present Illness: 55 yo with recent complicated admission to [facility] returns for followup.  He entered the hospital in 12/11 with an inferior MI and had a bare metal stent to an occluded circumflex.  His hospitalization was then complicated by GI bleeding requiring transfusion and altered mental status thought to be due to hepatic encephalopathy.  He was intubated due to altered mental status to protect his airway.  He developed ventilator-associated PNA and CHF, requiring antibiotics and diuresis.  He was noted to have findings consistent with cirrhosis on imaging, but family/patient denied ETOH and viral hepatitis workup was unrevealing.  He was finally discharged home after a prolonged course.    At home, patient seems to be doing quite well.  He has had no recurrent exertional chest pain.  He has had some chest tightness with stressful events.  No exertional dyspnea.  He is able to climb a flight of steps with no problem.  No melena or bright red blood per rectum.  His lower extremity edema has resolved and he has no abdominal distention.  He has quit smoking.  He saw Dr. Deatra Ina for workup of cirrhosis.  His AMA titer was elevated, raising the question of primary biliary cirrhosis.   ECG: NSR, old inferoposterior MI.   Labs (12/11): HBsAg negative, HAV ab negative, HCV ab negative, HBcAb negative, HVC RNA negative, ceruloplasmin level normal, ANA negative, anti-mitochondrial ab level elevated.  Labs (1/12): K 3.9, creatinine 0.75, HCT 28.1, AST 40, ALT 29 Labs (2/12): K 4.6, creatinine 0.9, ALT 23, AST 33  Current Medications (verified): 1)  Aspir-Low 81 Mg Tbec (Aspirin) .Adam Butler.. 1 Tablet Daily 2)  Plavix 75 Mg Tabs (Clopidogrel Bisulfate) .Adam Butler.. 1 Tablet Daily 3)  Furosemide 40 Mg Tabs  (Furosemide) .Adam Butler.. 1 Tablet Daily 4)  Insulin .... As Directed 5)  Ipratropium-Albuterol 0.5-2.5 (3) Mg/77m Soln (Ipratropium-Albuterol) ..Adam Butler. 1 Puff Inhaled Every 2 Hours As Needed 6)  Nitrostat 0.4 Mg Subl (Nitroglycerin) .... As Directed 7)  Nadolol 20 Mg Tabs (Nadolol) .... 1/2 Tablet By Mouth Daily 8)  Protonix 40 Mg Tbec (Pantoprazole Sodium) ..Adam Butler. 1 Tablet By Mouth Daily 9)  Spironolactone 50 Mg Tabs (Spironolactone) ..Adam Butler. 1 Tablet By Mouth Two Times A Day 10)  Multivitamins  Tabs (Multiple Vitamin) ..Adam Butler. 1 Tablet Daily 11)  Metformin Hcl 500 Mg Tabs (Metformin Hcl) .... One Tablet  Two Times A Day 12)  Lisinopril 2.5 Mg Tabs (Lisinopril) .... Take One Tablet By Mouth Daily 13)  Pravachol 20 Mg Tabs (Pravastatin Sodium) .... Take One Tablet By Mouth Once Daily.  Allergies (verified): No Known Drug Allergies  Past History:  Past Surgical History: Last updated: 002-12-2012Heart Catheterization-07/01/2010  Family History: Last updated: 02012/02/12Father:Deceased age 85146 CABG age 85935 Mother:Living. Siblings: 3 brothers & 1 sister living. 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  Married  Tobacco Use - Former. Quit Dec. 21, 2011. Smoked x 36 years. Alcohol Use - no Regular Exercise - yes--walk  Risk Factors: Exercise: yes (002-06-2011  Risk Factors: Smoking Status: quit (02012-02-12  Past Medical History: Reviewed history from 0Feb 12, 2012and no changes required. 1. CAD: Inferior MI 12/11.  LHC with occluded mid CFX and 80%  proximal RCA.  EF 55%.  He had 3.0 x 28 VIsion BMS to CFX.  2. Diastolic CHF: Echo (48/18) with EF 50-55% and mild LVH.  EF 55% by LV-gram in 12/11.  3. Hypertension 4. Type II diabetes 5. Cirrhosis: Cryptogenic, ? NASH.  Family and patient deny ETOH.  HCV, HBV, and HAV workup negative.  ANA negative.  AMA positive.  He had grade II esophageal varices on EGD with no  evidence for bleeding in 12/11.  He had hepatic encephalopaty and ascites in 12/11.  6. SVT (1/12): appeared to be an ectopic atrial tachycardia.  Required DCCV with hemodynamic instability.  7. GI bleed (12/11): Etiology not clearly defined.  EGD showed nonbleeding esophageal varices.   Family History: Reviewed history from 08/02/2010 and no changes required. Father:Deceased age 85. CABG age 30. Mother:Living. Siblings: 3 brothers & 1 sister living. 1 brother MI age 25 1 brother MI age 71  Social History: Reviewed history from 08/02/2010 and no changes required. Unemployed, worked in maintenance prior.  Married  Tobacco Use - Former. Quit Dec. 21, 2011. Smoked x 36 years. Alcohol Use - no Regular Exercise - yes--walk  Review of Systems       All systems reviewed and negative except as per HPI.   Vital Signs:  Patient profile:   55 year old male Height:      67 inches Weight:      213 pounds BMI:     33.48 Pulse rate:   67 / minute BP sitting:   101 / 63  (left arm) Cuff size:   regular  Vitals Entered By: Dolores Lory, CMA (September 03, 2010 3:24 PM)  Physical Exam  General:  Well developed, well nourished, in no acute distress.  Obese.  Neck:  Neck supple, no JVD. No masses, thyromegaly or abnormal cervical nodes. Lungs:  Clear bilaterally to auscultation and percussion. Heart:  Non-displaced PMI, chest non-tender; regular rate and rhythm, S1, S2 without murmurs, rubs or gallops. Carotid upstroke normal, no bruit.  Pedals normal pulses. No edema, no varicosities. Abdomen:  Bowel sounds positive; abdomen soft and non-tender without masses, organomegaly, or hernias noted. No hepatosplenomegaly. Extremities:  No clubbing or cyanosis. Neurologic:  Alert and oriented x 3. Psych:  Normal affect.   Impression & Recommendations:  Problem # 1:  CAD, NATIVE VESSEL (ICD-414.01) Stable with no exertional chest pain or dyspnea.  EF was preserved on echo.  No overt bleeding  since hospital discharge. Given lack of bleeding, I am going to leave him on Plavix (ideally, will stay on it for a year).  I will have to see how he does on it.  He had a residual 80% RCA stenosis that was untreated.  I will try to get him an ETT-myoview to assess for RCA-territory ischemia.  He is uninsured, so I will attempt to get him coverage for this through the [facility] care provision.  He will also continue on ASA 81 mg daily, nadolol (nonspecific beta blocker given varices), ACEI, and statin.  I am going to increase his pravastatin to 40 mg daily as his LFTs have been stable.  He needs to start cardiac rehab.   Problem # 2:  DIASTOLIC HEART FAILURE, CHRONIC (ICD-428.32) Preserved LV systolic function post-MI.  He had significant volume overload with some ascites while in the hospital.  He is now on Lasix and spironolactone.  He does not have any pedal edema or significant abdominal distention.  Neck veins are not elevated.  Continue current Lasix and spironolactone for now.    Problem # 3:  HYPERLIPIDEMIA-MIXED (PUL-249.4) As above, increasing pravastatin given normal transaminases.  Will get lipids/LFTs in 2 months.   Problem # 4:  CIRRHOSIS OF LIVER WITHOUT MENTION OF ALCOHOL (ICD-571.5) Following with Dr. Deatra Ina.  ? primary biliary cirrhosis based on elevated AMA titer.   Patient Instructions: 1)  Your physician recommends that you schedule a follow-up appointment in: 4 months 2)  TO BE SCHEDULED 09/2010:  Your physician recommends that you return for a FASTING lipid profile: (LIPID/LFT) 3)  Your physician has recommended you make the following change in your medication: INCREASE Pravastatin to 26m once daily. 4)  Your physician has requested that you have an exercise stress myoview.  For further information please visit [email or web address]  5)  We will call you with this information for an appt. Prescriptions: PRAVACHOL 40 MG TABS (PRAVASTATIN SODIUM) Take one tablet once  daily  #30 x 6   Entered by:   MDarlyne RussianRN   Authorized by:   DLoralie Champagne MD   Signed by:   MDarlyne RussianRN on 09/03/2010   Method used:   Electronically to        [phone] (retail)       [address]      [place]  [address]      Ph: [phone]      Fax: [phone]  RxID:   [id number]

## 2010-09-20 ENCOUNTER — Telehealth: Payer: Self-pay | Admitting: Cardiology

## 2010-09-20 ENCOUNTER — Telehealth: Payer: Self-pay | Admitting: Cardiovascular Disease

## 2010-09-20 LAB — CBC
HCT: 23.8 % — ABNORMAL LOW (ref 39.0–52.0)
HCT: 24.1 % — ABNORMAL LOW (ref 39.0–52.0)
HCT: 26.9 % — ABNORMAL LOW (ref 39.0–52.0)
HCT: 27 % — ABNORMAL LOW (ref 39.0–52.0)
HCT: 29 % — ABNORMAL LOW (ref 39.0–52.0)
HCT: 36.9 % — ABNORMAL LOW (ref 39.0–52.0)
Hemoglobin: 12.4 g/dL — ABNORMAL LOW (ref 13.0–17.0)
Hemoglobin: 7.7 g/dL — ABNORMAL LOW (ref 13.0–17.0)
Hemoglobin: 8 g/dL — ABNORMAL LOW (ref 13.0–17.0)
Hemoglobin: 8.1 g/dL — ABNORMAL LOW (ref 13.0–17.0)
Hemoglobin: 8.1 g/dL — ABNORMAL LOW (ref 13.0–17.0)
Hemoglobin: 8.3 g/dL — ABNORMAL LOW (ref 13.0–17.0)
Hemoglobin: 9 g/dL — ABNORMAL LOW (ref 13.0–17.0)
MCH: 30.8 pg (ref 26.0–34.0)
MCH: 30.9 pg (ref 26.0–34.0)
MCH: 31 pg (ref 26.0–34.0)
MCH: 31.2 pg (ref 26.0–34.0)
MCH: 31.2 pg (ref 26.0–34.0)
MCH: 31.4 pg (ref 26.0–34.0)
MCH: 31.5 pg (ref 26.0–34.0)
MCH: 32 pg (ref 26.0–34.0)
MCH: 32.1 pg (ref 26.0–34.0)
MCHC: 30.9 g/dL (ref 30.0–36.0)
MCHC: 31 g/dL (ref 30.0–36.0)
MCHC: 31.4 g/dL (ref 30.0–36.0)
MCHC: 32.4 g/dL (ref 30.0–36.0)
MCHC: 33 g/dL (ref 30.0–36.0)
MCHC: 33.6 g/dL (ref 30.0–36.0)
MCHC: 33.6 g/dL (ref 30.0–36.0)
MCHC: 34 g/dL (ref 30.0–36.0)
MCHC: 34.2 g/dL (ref 30.0–36.0)
MCV: 100.4 fL — ABNORMAL HIGH (ref 78.0–100.0)
MCV: 101.5 fL — ABNORMAL HIGH (ref 78.0–100.0)
MCV: 94.9 fL (ref 78.0–100.0)
MCV: 96.3 fL (ref 78.0–100.0)
MCV: 96.9 fL (ref 78.0–100.0)
Platelets: 101 10*3/uL — ABNORMAL LOW (ref 150–400)
Platelets: 107 10*3/uL — ABNORMAL LOW (ref 150–400)
Platelets: 117 10*3/uL — ABNORMAL LOW (ref 150–400)
Platelets: 129 10*3/uL — ABNORMAL LOW (ref 150–400)
Platelets: 175 10*3/uL (ref 150–400)
Platelets: 80 10*3/uL — ABNORMAL LOW (ref 150–400)
Platelets: 82 10*3/uL — ABNORMAL LOW (ref 150–400)
Platelets: 91 10*3/uL — ABNORMAL LOW (ref 150–400)
Platelets: 94 10*3/uL — ABNORMAL LOW (ref 150–400)
RBC: 2.28 MIL/uL — ABNORMAL LOW (ref 4.22–5.81)
RBC: 2.46 MIL/uL — ABNORMAL LOW (ref 4.22–5.81)
RBC: 2.52 MIL/uL — ABNORMAL LOW (ref 4.22–5.81)
RBC: 2.65 MIL/uL — ABNORMAL LOW (ref 4.22–5.81)
RBC: 2.71 MIL/uL — ABNORMAL LOW (ref 4.22–5.81)
RBC: 2.77 MIL/uL — ABNORMAL LOW (ref 4.22–5.81)
RBC: 2.88 MIL/uL — ABNORMAL LOW (ref 4.22–5.81)
RBC: 3.03 MIL/uL — ABNORMAL LOW (ref 4.22–5.81)
RDW: 13.6 % (ref 11.5–15.5)
RDW: 13.8 % (ref 11.5–15.5)
RDW: 16.2 % — ABNORMAL HIGH (ref 11.5–15.5)
RDW: 16.4 % — ABNORMAL HIGH (ref 11.5–15.5)
RDW: 16.5 % — ABNORMAL HIGH (ref 11.5–15.5)
RDW: 16.7 % — ABNORMAL HIGH (ref 11.5–15.5)
RDW: 16.9 % — ABNORMAL HIGH (ref 11.5–15.5)
RDW: 17.2 % — ABNORMAL HIGH (ref 11.5–15.5)
WBC: 10 10*3/uL (ref 4.0–10.5)
WBC: 11.6 10*3/uL — ABNORMAL HIGH (ref 4.0–10.5)
WBC: 12.4 10*3/uL — ABNORMAL HIGH (ref 4.0–10.5)
WBC: 17.5 10*3/uL — ABNORMAL HIGH (ref 4.0–10.5)
WBC: 25.5 10*3/uL — ABNORMAL HIGH (ref 4.0–10.5)
WBC: 26.4 10*3/uL — ABNORMAL HIGH (ref 4.0–10.5)
WBC: 8 10*3/uL (ref 4.0–10.5)
WBC: 8.9 10*3/uL (ref 4.0–10.5)

## 2010-09-20 LAB — BLOOD GAS, ARTERIAL
Acid-Base Excess: 1.2 mmol/L (ref 0.0–2.0)
Acid-base deficit: 1.4 mmol/L (ref 0.0–2.0)
Acid-base deficit: 2.6 mmol/L — ABNORMAL HIGH (ref 0.0–2.0)
Bicarbonate: 20 mEq/L (ref 20.0–24.0)
Bicarbonate: 20.1 mEq/L (ref 20.0–24.0)
Bicarbonate: 21.4 mEq/L (ref 20.0–24.0)
Bicarbonate: 22.9 mEq/L (ref 20.0–24.0)
Bicarbonate: 24.1 mEq/L — ABNORMAL HIGH (ref 20.0–24.0)
Bicarbonate: 24.5 mEq/L — ABNORMAL HIGH (ref 20.0–24.0)
Drawn by: 29757
Drawn by: 311371
Drawn by: 32526
Drawn by: 33247
FIO2: 0.3 %
FIO2: 0.35 %
FIO2: 0.4 %
FIO2: 0.4 %
Mode: POSITIVE
Mode: POSITIVE
O2 Content: 3 L/min
O2 Saturation: 95.8 %
O2 Saturation: 98.4 %
O2 Saturation: 99.6 %
O2 Saturation: 99.8 %
O2 Saturation: 99.9 %
PEEP: 5 cmH2O
PEEP: 5 cmH2O
PEEP: 5 cmH2O
PEEP: 5 cmH2O
PEEP: 5 cmH2O
Patient temperature: 101.2
Patient temperature: 101.5
Patient temperature: 98.6
Patient temperature: 98.6
Patient temperature: 98.6
Patient temperature: 99.2
Pressure control: 10 cmH2O
Pressure control: 14 cmH2O
Pressure support: 10 cmH2O
RATE: 15 resp/min
TCO2: 21 mmol/L (ref 0–100)
TCO2: 21.8 mmol/L (ref 0–100)
TCO2: 24 mmol/L (ref 0–100)
TCO2: 25.7 mmol/L (ref 0–100)
pCO2 arterial: 27.7 mmHg — ABNORMAL LOW (ref 35.0–45.0)
pCO2 arterial: 29.3 mmHg — ABNORMAL LOW (ref 35.0–45.0)
pCO2 arterial: 31.4 mmHg — ABNORMAL LOW (ref 35.0–45.0)
pCO2 arterial: 37 mmHg (ref 35.0–45.0)
pH, Arterial: 7.393 (ref 7.350–7.450)
pH, Arterial: 7.404 (ref 7.350–7.450)
pH, Arterial: 7.414 (ref 7.350–7.450)
pH, Arterial: 7.415 (ref 7.350–7.450)
pH, Arterial: 7.489 — ABNORMAL HIGH (ref 7.350–7.450)
pO2, Arterial: 105 mmHg — ABNORMAL HIGH (ref 80.0–100.0)
pO2, Arterial: 124 mmHg — ABNORMAL HIGH (ref 80.0–100.0)
pO2, Arterial: 173 mmHg — ABNORMAL HIGH (ref 80.0–100.0)
pO2, Arterial: 75.1 mmHg — ABNORMAL LOW (ref 80.0–100.0)
pO2, Arterial: 81.2 mmHg (ref 80.0–100.0)
pO2, Arterial: 98 mmHg (ref 80.0–100.0)

## 2010-09-20 LAB — URINE CULTURE
Colony Count: NO GROWTH
Culture  Setup Time: 201112222153

## 2010-09-20 LAB — COMPREHENSIVE METABOLIC PANEL
ALT: 29 U/L (ref 0–53)
AST: 272 U/L — ABNORMAL HIGH (ref 0–37)
AST: 277 U/L — ABNORMAL HIGH (ref 0–37)
AST: 55 U/L — ABNORMAL HIGH (ref 0–37)
AST: 99 U/L — ABNORMAL HIGH (ref 0–37)
Albumin: 2 g/dL — ABNORMAL LOW (ref 3.5–5.2)
Albumin: 2.1 g/dL — ABNORMAL LOW (ref 3.5–5.2)
Albumin: 2.2 g/dL — ABNORMAL LOW (ref 3.5–5.2)
Albumin: 2.4 g/dL — ABNORMAL LOW (ref 3.5–5.2)
Alkaline Phosphatase: 105 U/L (ref 39–117)
Alkaline Phosphatase: 65 U/L (ref 39–117)
Alkaline Phosphatase: 93 U/L (ref 39–117)
BUN: 18 mg/dL (ref 6–23)
BUN: 20 mg/dL (ref 6–23)
BUN: 22 mg/dL (ref 6–23)
BUN: 24 mg/dL — ABNORMAL HIGH (ref 6–23)
BUN: 37 mg/dL — ABNORMAL HIGH (ref 6–23)
CO2: 17 mEq/L — ABNORMAL LOW (ref 19–32)
CO2: 19 mEq/L (ref 19–32)
CO2: 22 mEq/L (ref 19–32)
Calcium: 7.4 mg/dL — ABNORMAL LOW (ref 8.4–10.5)
Calcium: 7.5 mg/dL — ABNORMAL LOW (ref 8.4–10.5)
Chloride: 111 mEq/L (ref 96–112)
Chloride: 117 mEq/L — ABNORMAL HIGH (ref 96–112)
Chloride: 117 mEq/L — ABNORMAL HIGH (ref 96–112)
Chloride: 119 mEq/L — ABNORMAL HIGH (ref 96–112)
Creatinine, Ser: 0.79 mg/dL (ref 0.4–1.5)
Creatinine, Ser: 0.79 mg/dL (ref 0.4–1.5)
Creatinine, Ser: 0.82 mg/dL (ref 0.4–1.5)
Creatinine, Ser: 0.91 mg/dL (ref 0.4–1.5)
Creatinine, Ser: 1.07 mg/dL (ref 0.4–1.5)
GFR calc Af Amer: >60 mL/min (ref >60)
GFR calc Af Amer: >60 mL/min (ref >60)
GFR calc Af Amer: >60 mL/min (ref >60)
GFR calc Af Amer: >60 mL/min (ref >60)
GFR calc non Af Amer: >60 mL/min (ref >60)
GFR calc non Af Amer: >60 mL/min (ref >60)
GFR calc non Af Amer: >60 mL/min (ref >60)
GFR calc non Af Amer: >60 mL/min (ref >60)
Glucose, Bld: 401 mg/dL — ABNORMAL HIGH (ref 70–99)
Potassium: 3.5 mEq/L (ref 3.5–5.1)
Potassium: 3.6 mEq/L (ref 3.5–5.1)
Potassium: 5.1 mEq/L (ref 3.5–5.1)
Total Bilirubin: 0.8 mg/dL (ref 0.3–1.2)
Total Bilirubin: 0.9 mg/dL (ref 0.3–1.2)
Total Bilirubin: 1 mg/dL (ref 0.3–1.2)
Total Bilirubin: 1 mg/dL (ref 0.3–1.2)
Total Bilirubin: 1.1 mg/dL (ref 0.3–1.2)
Total Protein: 5.5 g/dL — ABNORMAL LOW (ref 6.0–8.3)
Total Protein: 6.5 g/dL (ref 6.0–8.3)

## 2010-09-20 LAB — CARDIAC PANEL(CRET KIN+CKTOT+MB+TROPI)
CK, MB: 3.8 ng/mL (ref 0.3–4.0)
CK, MB: 588.1 ng/mL (ref 0.3–4.0)
Relative Index: 13.2 — ABNORMAL HIGH (ref 0.0–2.5)
Relative Index: INVALID (ref 0.0–2.5)
Total CK: 87 U/L (ref 7–232)
Total CK: 91 U/L (ref 7–232)
Troponin I: >100 ng/mL (ref 0.00–0.06)

## 2010-09-20 LAB — HEMOCCULT GUIAC POC 1CARD (OFFICE)
Fecal Occult Bld: POSITIVE
Fecal Occult Bld: POSITIVE

## 2010-09-20 LAB — URINALYSIS, MICROSCOPIC ONLY
Bilirubin Urine: NEGATIVE
Glucose, UA: 250 mg/dL — AB
Glucose, UA: >1000 mg/dL — AB
Hgb urine dipstick: NEGATIVE
Ketones, ur: 15 mg/dL — AB
Nitrite: NEGATIVE
Specific Gravity, Urine: 1.026 (ref 1.005–1.030)
Specific Gravity, Urine: 1.04 — ABNORMAL HIGH (ref 1.005–1.030)
pH: 6 (ref 5.0–8.0)
pH: 6.5 (ref 5.0–8.0)

## 2010-09-20 LAB — HEPATIC FUNCTION PANEL
ALT: 42 U/L (ref 0–53)
ALT: 84 U/L — ABNORMAL HIGH (ref 0–53)
Albumin: 1.8 g/dL — ABNORMAL LOW (ref 3.5–5.2)
Alkaline Phosphatase: 65 U/L (ref 39–117)
Alkaline Phosphatase: 72 U/L (ref 39–117)
Alkaline Phosphatase: 75 U/L (ref 39–117)
Alkaline Phosphatase: 85 U/L (ref 39–117)
Indirect Bilirubin: 0.5 mg/dL (ref 0.3–0.9)
Indirect Bilirubin: 0.5 mg/dL (ref 0.3–0.9)
Indirect Bilirubin: 0.6 mg/dL (ref 0.3–0.9)
Total Bilirubin: 0.7 mg/dL (ref 0.3–1.2)
Total Bilirubin: 0.8 mg/dL (ref 0.3–1.2)
Total Bilirubin: 1 mg/dL (ref 0.3–1.2)
Total Protein: 4.9 g/dL — ABNORMAL LOW (ref 6.0–8.3)
Total Protein: 5.1 g/dL — ABNORMAL LOW (ref 6.0–8.3)
Total Protein: 5.3 g/dL — ABNORMAL LOW (ref 6.0–8.3)
Total Protein: 5.3 g/dL — ABNORMAL LOW (ref 6.0–8.3)

## 2010-09-20 LAB — GLUCOSE, CAPILLARY
Glucose-Capillary: 112 mg/dL — ABNORMAL HIGH (ref 70–99)
Glucose-Capillary: 115 mg/dL — ABNORMAL HIGH (ref 70–99)
Glucose-Capillary: 117 mg/dL — ABNORMAL HIGH (ref 70–99)
Glucose-Capillary: 120 mg/dL — ABNORMAL HIGH (ref 70–99)
Glucose-Capillary: 124 mg/dL — ABNORMAL HIGH (ref 70–99)
Glucose-Capillary: 128 mg/dL — ABNORMAL HIGH (ref 70–99)
Glucose-Capillary: 128 mg/dL — ABNORMAL HIGH (ref 70–99)
Glucose-Capillary: 130 mg/dL — ABNORMAL HIGH (ref 70–99)
Glucose-Capillary: 131 mg/dL — ABNORMAL HIGH (ref 70–99)
Glucose-Capillary: 132 mg/dL — ABNORMAL HIGH (ref 70–99)
Glucose-Capillary: 133 mg/dL — ABNORMAL HIGH (ref 70–99)
Glucose-Capillary: 137 mg/dL — ABNORMAL HIGH (ref 70–99)
Glucose-Capillary: 138 mg/dL — ABNORMAL HIGH (ref 70–99)
Glucose-Capillary: 138 mg/dL — ABNORMAL HIGH (ref 70–99)
Glucose-Capillary: 139 mg/dL — ABNORMAL HIGH (ref 70–99)
Glucose-Capillary: 141 mg/dL — ABNORMAL HIGH (ref 70–99)
Glucose-Capillary: 141 mg/dL — ABNORMAL HIGH (ref 70–99)
Glucose-Capillary: 142 mg/dL — ABNORMAL HIGH (ref 70–99)
Glucose-Capillary: 143 mg/dL — ABNORMAL HIGH (ref 70–99)
Glucose-Capillary: 145 mg/dL — ABNORMAL HIGH (ref 70–99)
Glucose-Capillary: 145 mg/dL — ABNORMAL HIGH (ref 70–99)
Glucose-Capillary: 146 mg/dL — ABNORMAL HIGH (ref 70–99)
Glucose-Capillary: 147 mg/dL — ABNORMAL HIGH (ref 70–99)
Glucose-Capillary: 147 mg/dL — ABNORMAL HIGH (ref 70–99)
Glucose-Capillary: 147 mg/dL — ABNORMAL HIGH (ref 70–99)
Glucose-Capillary: 148 mg/dL — ABNORMAL HIGH (ref 70–99)
Glucose-Capillary: 149 mg/dL — ABNORMAL HIGH (ref 70–99)
Glucose-Capillary: 151 mg/dL — ABNORMAL HIGH (ref 70–99)
Glucose-Capillary: 151 mg/dL — ABNORMAL HIGH (ref 70–99)
Glucose-Capillary: 151 mg/dL — ABNORMAL HIGH (ref 70–99)
Glucose-Capillary: 152 mg/dL — ABNORMAL HIGH (ref 70–99)
Glucose-Capillary: 152 mg/dL — ABNORMAL HIGH (ref 70–99)
Glucose-Capillary: 152 mg/dL — ABNORMAL HIGH (ref 70–99)
Glucose-Capillary: 156 mg/dL — ABNORMAL HIGH (ref 70–99)
Glucose-Capillary: 156 mg/dL — ABNORMAL HIGH (ref 70–99)
Glucose-Capillary: 157 mg/dL — ABNORMAL HIGH (ref 70–99)
Glucose-Capillary: 157 mg/dL — ABNORMAL HIGH (ref 70–99)
Glucose-Capillary: 158 mg/dL — ABNORMAL HIGH (ref 70–99)
Glucose-Capillary: 159 mg/dL — ABNORMAL HIGH (ref 70–99)
Glucose-Capillary: 159 mg/dL — ABNORMAL HIGH (ref 70–99)
Glucose-Capillary: 160 mg/dL — ABNORMAL HIGH (ref 70–99)
Glucose-Capillary: 160 mg/dL — ABNORMAL HIGH (ref 70–99)
Glucose-Capillary: 160 mg/dL — ABNORMAL HIGH (ref 70–99)
Glucose-Capillary: 161 mg/dL — ABNORMAL HIGH (ref 70–99)
Glucose-Capillary: 162 mg/dL — ABNORMAL HIGH (ref 70–99)
Glucose-Capillary: 163 mg/dL — ABNORMAL HIGH (ref 70–99)
Glucose-Capillary: 163 mg/dL — ABNORMAL HIGH (ref 70–99)
Glucose-Capillary: 163 mg/dL — ABNORMAL HIGH (ref 70–99)
Glucose-Capillary: 164 mg/dL — ABNORMAL HIGH (ref 70–99)
Glucose-Capillary: 164 mg/dL — ABNORMAL HIGH (ref 70–99)
Glucose-Capillary: 164 mg/dL — ABNORMAL HIGH (ref 70–99)
Glucose-Capillary: 165 mg/dL — ABNORMAL HIGH (ref 70–99)
Glucose-Capillary: 166 mg/dL — ABNORMAL HIGH (ref 70–99)
Glucose-Capillary: 166 mg/dL — ABNORMAL HIGH (ref 70–99)
Glucose-Capillary: 166 mg/dL — ABNORMAL HIGH (ref 70–99)
Glucose-Capillary: 166 mg/dL — ABNORMAL HIGH (ref 70–99)
Glucose-Capillary: 167 mg/dL — ABNORMAL HIGH (ref 70–99)
Glucose-Capillary: 167 mg/dL — ABNORMAL HIGH (ref 70–99)
Glucose-Capillary: 168 mg/dL — ABNORMAL HIGH (ref 70–99)
Glucose-Capillary: 168 mg/dL — ABNORMAL HIGH (ref 70–99)
Glucose-Capillary: 169 mg/dL — ABNORMAL HIGH (ref 70–99)
Glucose-Capillary: 169 mg/dL — ABNORMAL HIGH (ref 70–99)
Glucose-Capillary: 169 mg/dL — ABNORMAL HIGH (ref 70–99)
Glucose-Capillary: 170 mg/dL — ABNORMAL HIGH (ref 70–99)
Glucose-Capillary: 172 mg/dL — ABNORMAL HIGH (ref 70–99)
Glucose-Capillary: 172 mg/dL — ABNORMAL HIGH (ref 70–99)
Glucose-Capillary: 172 mg/dL — ABNORMAL HIGH (ref 70–99)
Glucose-Capillary: 172 mg/dL — ABNORMAL HIGH (ref 70–99)
Glucose-Capillary: 172 mg/dL — ABNORMAL HIGH (ref 70–99)
Glucose-Capillary: 173 mg/dL — ABNORMAL HIGH (ref 70–99)
Glucose-Capillary: 173 mg/dL — ABNORMAL HIGH (ref 70–99)
Glucose-Capillary: 173 mg/dL — ABNORMAL HIGH (ref 70–99)
Glucose-Capillary: 174 mg/dL — ABNORMAL HIGH (ref 70–99)
Glucose-Capillary: 180 mg/dL — ABNORMAL HIGH (ref 70–99)
Glucose-Capillary: 180 mg/dL — ABNORMAL HIGH (ref 70–99)
Glucose-Capillary: 181 mg/dL — ABNORMAL HIGH (ref 70–99)
Glucose-Capillary: 182 mg/dL — ABNORMAL HIGH (ref 70–99)
Glucose-Capillary: 185 mg/dL — ABNORMAL HIGH (ref 70–99)
Glucose-Capillary: 194 mg/dL — ABNORMAL HIGH (ref 70–99)
Glucose-Capillary: 196 mg/dL — ABNORMAL HIGH (ref 70–99)
Glucose-Capillary: 201 mg/dL — ABNORMAL HIGH (ref 70–99)
Glucose-Capillary: 203 mg/dL — ABNORMAL HIGH (ref 70–99)
Glucose-Capillary: 204 mg/dL — ABNORMAL HIGH (ref 70–99)
Glucose-Capillary: 206 mg/dL — ABNORMAL HIGH (ref 70–99)
Glucose-Capillary: 211 mg/dL — ABNORMAL HIGH (ref 70–99)
Glucose-Capillary: 224 mg/dL — ABNORMAL HIGH (ref 70–99)
Glucose-Capillary: 260 mg/dL — ABNORMAL HIGH (ref 70–99)
Glucose-Capillary: 279 mg/dL — ABNORMAL HIGH (ref 70–99)
Glucose-Capillary: 280 mg/dL — ABNORMAL HIGH (ref 70–99)
Glucose-Capillary: 299 mg/dL — ABNORMAL HIGH (ref 70–99)
Glucose-Capillary: 310 mg/dL — ABNORMAL HIGH (ref 70–99)
Glucose-Capillary: 328 mg/dL — ABNORMAL HIGH (ref 70–99)
Glucose-Capillary: 340 mg/dL — ABNORMAL HIGH (ref 70–99)
Glucose-Capillary: 351 mg/dL — ABNORMAL HIGH (ref 70–99)
Glucose-Capillary: 361 mg/dL — ABNORMAL HIGH (ref 70–99)
Glucose-Capillary: 372 mg/dL — ABNORMAL HIGH (ref 70–99)

## 2010-09-20 LAB — PROTIME-INR
INR: 1.12 (ref 0.00–1.49)
INR: 1.17 (ref 0.00–1.49)
INR: 1.2 (ref 0.00–1.49)
INR: 1.26 (ref 0.00–1.49)
Prothrombin Time: 14.6 seconds (ref 11.6–15.2)
Prothrombin Time: 15.1 seconds (ref 11.6–15.2)
Prothrombin Time: 16.4 seconds — ABNORMAL HIGH (ref 11.6–15.2)
Prothrombin Time: 17.1 seconds — ABNORMAL HIGH (ref 11.6–15.2)

## 2010-09-20 LAB — HCV RNA QUANT

## 2010-09-20 LAB — CROSSMATCH
Antibody Screen: NEGATIVE
Unit division: 0
Unit division: 0
Unit division: 0

## 2010-09-20 LAB — BASIC METABOLIC PANEL
BUN: 15 mg/dL (ref 6–23)
BUN: 16 mg/dL (ref 6–23)
BUN: 17 mg/dL (ref 6–23)
BUN: 17 mg/dL (ref 6–23)
BUN: 19 mg/dL (ref 6–23)
BUN: 25 mg/dL — ABNORMAL HIGH (ref 6–23)
CO2: 24 mEq/L (ref 19–32)
CO2: 25 mEq/L (ref 19–32)
CO2: 26 mEq/L (ref 19–32)
CO2: 26 mEq/L (ref 19–32)
Calcium: 7.4 mg/dL — ABNORMAL LOW (ref 8.4–10.5)
Calcium: 7.6 mg/dL — ABNORMAL LOW (ref 8.4–10.5)
Calcium: 7.6 mg/dL — ABNORMAL LOW (ref 8.4–10.5)
Calcium: 7.7 mg/dL — ABNORMAL LOW (ref 8.4–10.5)
Calcium: 7.8 mg/dL — ABNORMAL LOW (ref 8.4–10.5)
Calcium: 7.9 mg/dL — ABNORMAL LOW (ref 8.4–10.5)
Calcium: 8 mg/dL — ABNORMAL LOW (ref 8.4–10.5)
Chloride: 123 mEq/L — ABNORMAL HIGH (ref 96–112)
Chloride: 123 mEq/L — ABNORMAL HIGH (ref 96–112)
Chloride: 125 mEq/L — ABNORMAL HIGH (ref 96–112)
Creatinine, Ser: 0.72 mg/dL (ref 0.4–1.5)
Creatinine, Ser: 0.84 mg/dL (ref 0.4–1.5)
Creatinine, Ser: 0.86 mg/dL (ref 0.4–1.5)
Creatinine, Ser: 0.88 mg/dL (ref 0.4–1.5)
Creatinine, Ser: 0.92 mg/dL (ref 0.4–1.5)
GFR calc Af Amer: >60 mL/min (ref >60)
GFR calc Af Amer: >60 mL/min (ref >60)
GFR calc Af Amer: >60 mL/min (ref >60)
GFR calc Af Amer: >60 mL/min (ref >60)
GFR calc Af Amer: >60 mL/min (ref >60)
GFR calc Af Amer: >60 mL/min (ref >60)
GFR calc non Af Amer: >60 mL/min (ref >60)
GFR calc non Af Amer: >60 mL/min (ref >60)
GFR calc non Af Amer: >60 mL/min (ref >60)
GFR calc non Af Amer: >60 mL/min (ref >60)
GFR calc non Af Amer: >60 mL/min (ref >60)
GFR calc non Af Amer: >60 mL/min (ref >60)
GFR calc non Af Amer: >60 mL/min (ref >60)
GFR calc non Af Amer: >60 mL/min (ref >60)
Glucose, Bld: 119 mg/dL — ABNORMAL HIGH (ref 70–99)
Glucose, Bld: 120 mg/dL — ABNORMAL HIGH (ref 70–99)
Glucose, Bld: 176 mg/dL — ABNORMAL HIGH (ref 70–99)
Potassium: 3.6 mEq/L (ref 3.5–5.1)
Potassium: 3.6 mEq/L (ref 3.5–5.1)
Potassium: 3.6 mEq/L (ref 3.5–5.1)
Potassium: 3.9 mEq/L (ref 3.5–5.1)
Sodium: 146 mEq/L — ABNORMAL HIGH (ref 135–145)
Sodium: 151 mEq/L — ABNORMAL HIGH (ref 135–145)
Sodium: 151 mEq/L — ABNORMAL HIGH (ref 135–145)
Sodium: 152 mEq/L — ABNORMAL HIGH (ref 135–145)
Sodium: 153 mEq/L — ABNORMAL HIGH (ref 135–145)
Sodium: 153 mEq/L — ABNORMAL HIGH (ref 135–145)
Sodium: 154 mEq/L — ABNORMAL HIGH (ref 135–145)

## 2010-09-20 LAB — AMMONIA
Ammonia: 193 umol/L — ABNORMAL HIGH (ref 11–35)
Ammonia: 28 umol/L (ref 11–35)
Ammonia: 71 umol/L — ABNORMAL HIGH (ref 11–35)
Ammonia: 77 umol/L — ABNORMAL HIGH (ref 11–35)

## 2010-09-20 LAB — URINE MICROSCOPIC-ADD ON

## 2010-09-20 LAB — DIFFERENTIAL
Basophils Absolute: 0.1 10*3/uL (ref 0.0–0.1)
Basophils Relative: 0 % (ref 0–1)
Eosinophils Relative: 1 % (ref 0–5)
Lymphs Abs: 1.7 10*3/uL (ref 0.7–4.0)
Monocytes Absolute: 1.7 10*3/uL — ABNORMAL HIGH (ref 0.1–1.0)
Neutro Abs: 8 10*3/uL — ABNORMAL HIGH (ref 1.7–7.7)
Neutrophils Relative %: 69 % (ref 43–77)

## 2010-09-20 LAB — HEMOGLOBIN A1C: Hgb A1c MFr Bld: 10.9 % — ABNORMAL HIGH (ref <5.7)

## 2010-09-20 LAB — CULTURE, BLOOD (ROUTINE X 2)
Culture  Setup Time: 201112220540
Culture  Setup Time: 201112220541
Culture  Setup Time: 201112230217
Culture  Setup Time: 201112311801
Culture  Setup Time: 201112311801
Culture: NO GROWTH
Culture: NO GROWTH
Culture: NO GROWTH
Culture: NO GROWTH

## 2010-09-20 LAB — URINALYSIS, ROUTINE W REFLEX MICROSCOPIC
Glucose, UA: NEGATIVE mg/dL
Specific Gravity, Urine: 1.032 — ABNORMAL HIGH (ref 1.005–1.030)
pH: 6 (ref 5.0–8.0)

## 2010-09-20 LAB — PREPARE RBC (CROSSMATCH)

## 2010-09-20 LAB — MRSA PCR SCREENING: MRSA by PCR: NEGATIVE

## 2010-09-20 LAB — CULTURE, RESPIRATORY W GRAM STAIN

## 2010-09-20 LAB — POCT I-STAT 3, ART BLOOD GAS (G3+)
Acid-base deficit: 8 mmol/L — ABNORMAL HIGH (ref 0.0–2.0)
Bicarbonate: 16.7 mEq/L — ABNORMAL LOW (ref 20.0–24.0)
O2 Saturation: 97 %
TCO2: 18 mmol/L (ref 0–100)
pCO2 arterial: 32.3 mmHg — ABNORMAL LOW (ref 35.0–45.0)
pO2, Arterial: 91 mmHg (ref 80.0–100.0)

## 2010-09-20 LAB — POCT I-STAT, CHEM 8
Calcium, Ion: 1.11 mmol/L — ABNORMAL LOW (ref 1.12–1.32)
Chloride: 106 mEq/L (ref 96–112)
Creatinine, Ser: <0.2 mg/dL — ABNORMAL LOW (ref 0.4–1.5)
Glucose, Bld: 417 mg/dL — ABNORMAL HIGH (ref 70–99)
HCT: 39 % (ref 39.0–52.0)

## 2010-09-20 LAB — HEMOGLOBIN AND HEMATOCRIT, BLOOD
HCT: 26.9 % — ABNORMAL LOW (ref 39.0–52.0)
HCT: 26.9 % — ABNORMAL LOW (ref 39.0–52.0)
HCT: 27.7 % — ABNORMAL LOW (ref 39.0–52.0)
Hemoglobin: 8.3 g/dL — ABNORMAL LOW (ref 13.0–17.0)
Hemoglobin: 8.4 g/dL — ABNORMAL LOW (ref 13.0–17.0)
Hemoglobin: 8.7 g/dL — ABNORMAL LOW (ref 13.0–17.0)

## 2010-09-20 LAB — IRON: Iron: 10 ug/dL — ABNORMAL LOW (ref 42–135)

## 2010-09-20 LAB — ABO/RH: ABO/RH(D): O POS

## 2010-09-20 LAB — PHOSPHORUS
Phosphorus: 2.4 mg/dL (ref 2.3–4.6)
Phosphorus: 2.8 mg/dL (ref 2.3–4.6)
Phosphorus: 3.3 mg/dL (ref 2.3–4.6)

## 2010-09-20 LAB — HEPATITIS PANEL, ACUTE
HCV Ab: NEGATIVE
Hep B C IgM: NEGATIVE
Hepatitis B Surface Ag: NEGATIVE

## 2010-09-20 LAB — FERRITIN: Ferritin: 52 ng/mL (ref 22–322)

## 2010-09-20 LAB — MITOCHONDRIAL ANTIBODIES: Mitochondrial M2 Ab, IgG: 1.15 — ABNORMAL HIGH (ref <0.91)

## 2010-09-20 LAB — POCT CARDIAC MARKERS
CKMB, poc: 2.2 ng/mL (ref 1.0–8.0)
Troponin i, poc: <0.05 ng/mL (ref 0.00–0.09)

## 2010-09-20 LAB — ANTI-SMOOTH MUSCLE ANTIBODY, IGG: F-Actin IgG: <20

## 2010-09-20 LAB — CULTURE, BAL-QUANTITATIVE W GRAM STAIN

## 2010-09-20 LAB — CK: Total CK: 662 U/L — ABNORMAL HIGH (ref 7–232)

## 2010-09-20 LAB — CORTISOL: Cortisol, Plasma: 26 ug/dL

## 2010-09-20 LAB — CARBOXYHEMOGLOBIN
Methemoglobin: 0.7 % (ref 0.0–1.5)
O2 Saturation: 76 %
Total hemoglobin: 8.1 g/dL — ABNORMAL LOW (ref 13.5–18.0)

## 2010-09-20 LAB — MAGNESIUM
Magnesium: 1.9 mg/dL (ref 1.5–2.5)
Magnesium: 2.4 mg/dL (ref 1.5–2.5)

## 2010-09-20 LAB — LACTIC ACID, PLASMA: Lactic Acid, Venous: 10.2 mmol/L — ABNORMAL HIGH (ref 0.5–2.2)

## 2010-09-21 NOTE — Progress Notes (Signed)
Summary: Myoview  ---- Converted from flag ---- ---- 09/08/2010 8:39 PM, Loralie Champagne, MD wrote: Adam Butler.  If he will not receive coverage, will not be able to do it. Would have him continue to work on [employer].  ---- 09/08/2010 4:00 PM, Darlyne Russian RN wrote: Pt was denied of financial assistance with [facility], verified today @ [phone]. Pt aware. Pt does not want Myoview or rehab since denied. Please advise. ------------------------------

## 2010-09-27 ENCOUNTER — Encounter: Payer: Self-pay | Admitting: Cardiology

## 2010-09-28 NOTE — Progress Notes (Signed)
Summary: samples of plavix  Phone Note Call from Patient Call back at Home Phone [phone]   Caller: Spouse Reason for Call: Talk to Nurse Summary of Call: samples of plavix 75 mg.  Initial call taken by: Neil Crouch,  September 20, 2010 10:44 AM  Follow-up for Phone Call        gave pt 12 days of Plavix 48m Samples, pt stated he waiting on Rx to be mailed here for him to pick up, will ask ALelon Frohlichif she know about Rx. NHansel FeinsteinCMA  September 20, 2010 2:34 PM  Follow-up by: NHansel FeinsteinCMA,  September 20, 2010 2:34 PM    Prescriptions: PLAVIX 75 MG TABS (CLOPIDOGREL BISULFATE) 1 tablet daily  #12 x 0   Entered by:   NHansel FeinsteinCMA   Authorized by:   DLoralie Champagne MD   Signed by:   NHansel FeinsteinCMA on 09/20/2010   Method used:   Samples Given   RxID:   [id number]

## 2010-09-28 NOTE — Progress Notes (Addendum)
Summary: Plavix  Phone Note Call from Patient Call back at Home Phone [phone]   Caller: Patient Call For: Plavix Reason for Call: Refill Medication Summary of Call: left samples of Plavix up at front desk for pt, he stated he has received pt assist. for this medication and that it was going to be mailed to [place]. Hansel Feinstein CMA  September 20, 2010 3:04 PM  Initial call taken by: Hansel Feinstein CMA,  September 20, 2010 3:04 PM  Follow-up for Phone Call        Lelon Frohlich stated that pt was a [employer] pt. called and spoke with Darlyne Russian RN at [facility] she is to look into Assisitance for pt. Hansel Feinstein CMA  September 20, 2010 3:22 PM  Follow-up by: Hansel Feinstein CMA,  September 20, 2010 3:22 PM  Additional Follow-up for Phone Call Additional follow up Details #1::        Spoke to [facility], pt was approved for Plavix and these have been sent out since January 2012. The representative states the name on the application was Kerby Nora, so I'm assuming this was sent from [facility] office and the address on application did not have a suite number (#[id number]) but was addressed to [facility] office [employer]. So this has been shipped but has not been received by [facility]. She stated that someone signed the package by name of Jani Gravel. We have asked other offices in our building but cannot find anyone by this name.   We will send letter to [facility] (per their request) that this occurred. The correct address has been updated, and they will send [place] pt's Plavix. We will make sure pt has samples to cover him until this arrives. Pt notified of this. Additional Follow-up by: Darlyne Russian RN,  September 21, 2010 4:54 PM

## 2010-10-01 ENCOUNTER — Other Ambulatory Visit (INDEPENDENT_AMBULATORY_CARE_PROVIDER_SITE_OTHER): Payer: Self-pay | Admitting: *Deleted

## 2010-10-01 DIAGNOSIS — E785 Hyperlipidemia, unspecified: Secondary | ICD-10-CM

## 2010-10-01 NOTE — Progress Notes (Signed)
Addended by: Dolores Lory on: 10/01/2010 09:21 AM   Modules accepted: Orders

## 2010-10-01 NOTE — Progress Notes (Signed)
Addended by: Dolores Lory on: 10/01/2010 08:53 AM   Modules accepted: Orders

## 2010-10-01 NOTE — Progress Notes (Signed)
Addended by: Dolores Lory on: 10/01/2010 09:47 AM   Modules accepted: Orders

## 2010-10-07 NOTE — Miscellaneous (Signed)
Summary: Rehab Report  Rehab Report   Imported By: Zenovia Jarred 09/20/2010 14:56:43  _____________________________________________________________________  External Attachment:    Type:   Image     Comment:   External Document

## 2010-10-07 NOTE — Letter (Signed)
Summary: Generic Doctor, general practice at [place], [place] [address]   Phone: [phone]  Fax: [phone]        September 27, 2010 MRN: [id number]    Adam Butler [name], [name]  [address] DOB 09-02-2055 ID # [id number] Plavix 36m    To whom it may concern,  Mr. BGonyerwas set up with patient assistance for Plavix in January of 2012. We had never received medication and patient states he has not received either. I spoke with [employer] to verify correct information was given and I found out that the address was incorrect. The road name was misspelled and the suite number was left off. [place]. I verified that [facility]. I confirmed that no other office in our building had recieved the Plavix. We will need this to be re-sent, I have updated correct information with representative from [employer]. Please call with any questions or concerns [phone]         Sincerely,        MDarlyne RussianRN

## 2010-10-12 ENCOUNTER — Encounter: Payer: Self-pay | Admitting: *Deleted

## 2010-10-28 ENCOUNTER — Ambulatory Visit: Payer: Self-pay | Admitting: Gastroenterology

## 2010-12-23 ENCOUNTER — Encounter: Payer: Self-pay | Admitting: Internal Medicine

## 2011-01-04 ENCOUNTER — Ambulatory Visit (INDEPENDENT_AMBULATORY_CARE_PROVIDER_SITE_OTHER): Payer: Medicaid Other | Admitting: Cardiology

## 2011-01-04 ENCOUNTER — Encounter: Payer: Self-pay | Admitting: *Deleted

## 2011-01-04 ENCOUNTER — Telehealth: Payer: Self-pay | Admitting: *Deleted

## 2011-01-04 ENCOUNTER — Encounter: Payer: Self-pay | Admitting: Cardiology

## 2011-01-04 DIAGNOSIS — I2510 Atherosclerotic heart disease of native coronary artery without angina pectoris: Secondary | ICD-10-CM

## 2011-01-04 DIAGNOSIS — R079 Chest pain, unspecified: Secondary | ICD-10-CM

## 2011-01-04 DIAGNOSIS — I5032 Chronic diastolic (congestive) heart failure: Secondary | ICD-10-CM

## 2011-01-04 DIAGNOSIS — K7460 Unspecified cirrhosis of liver: Secondary | ICD-10-CM

## 2011-01-04 DIAGNOSIS — E785 Hyperlipidemia, unspecified: Secondary | ICD-10-CM

## 2011-01-04 DIAGNOSIS — I509 Heart failure, unspecified: Secondary | ICD-10-CM

## 2011-01-04 DIAGNOSIS — I219 Acute myocardial infarction, unspecified: Secondary | ICD-10-CM

## 2011-01-04 DIAGNOSIS — I240 Acute coronary thrombosis not resulting in myocardial infarction: Secondary | ICD-10-CM

## 2011-01-04 DIAGNOSIS — R0602 Shortness of breath: Secondary | ICD-10-CM

## 2011-01-04 MED ORDER — PRAVASTATIN SODIUM 40 MG PO TABS: 40.0000 mg | ORAL_TABLET | Freq: Every day | ORAL | Status: DC

## 2011-01-04 MED ORDER — LISINOPRIL 2.5 MG PO TABS: 2.5000 mg | ORAL_TABLET | Freq: Every day | ORAL | Status: DC

## 2011-01-04 NOTE — Telephone Encounter (Signed)
Called pt to also notify him to hold Natolol prior to his Myoview scheduled 01/13/11. Attempted to contact pt, LMOM TCB.

## 2011-01-04 NOTE — Patient Instructions (Signed)
Continue current medications.   Your physician recommends that you return for a FASTING lipid profile: (Lipid/LFT/BMP/BNP)  You have been scheduled for Treadmill Myoview Stress Test in [place]. Please refer to instructions given today.  Your physician recommends that you schedule a follow-up appointment in: 2 months

## 2011-01-05 NOTE — Telephone Encounter (Signed)
Attempted to contact pt, LMOM TCB.  

## 2011-01-05 NOTE — Progress Notes (Signed)
PCP: Dr. Sheryle Hail, [place]   55 yo with complicated admission to [facility] in 12/11 returns for followup.  He entered the hospital in 12/11 with an inferior MI and had a bare metal stent to an occluded circumflex.  His hospitalization was then complicated by GI bleeding requiring transfusion and altered mental status thought to be due to hepatic encephalopathy.  He was intubated due to altered mental status to protect his airway.  He developed ventilator-associated PNA and CHF, requiring antibiotics and diuresis.  He was noted to have findings consistent with cirrhosis on imaging, but family/patient denied ETOH and viral hepatitis workup was unrevealing.  He was finally discharged home after a prolonged course.  He has had trouble getting medical care due to lack of insurance but has recently gotten Medicaid and disability.  He has seen Dr. Deatra Ina for workup of cirrhosis.  His AMA titer was elevated, raising the question of primary biliary cirrhosis.   Since I last saw him, patient has unfortunately restarted smoking (5 cigs/day).  He has had significant dietary indiscretion and weight is up about 20 lbs.  He has been trying to do some walking.  He is short of breath after walking about 3/4 mile or with walking up a flight of steps.  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  He is now on disability.  He does report chest pain with heavy exertion such as mowing his grass or working with a floor sander that resolves with rest.  I had wanted him to get an ETT-myoview given residual RCA stenosis that was not intervened upon but he was not able to do this due to finances.  ECG: NSR, old inferoposterior MI, old anterolateral MI.   Labs (12/11): HBsAg negative, HAV ab negative, HCV ab negative, HBcAb negative, HVC RNA negative, ceruloplasmin level normal, ANA negative, anti-mitochondrial ab level elevated.  Labs (1/12): K 3.9, creatinine 0.75, HCT 28.1, AST  40, ALT 29 Labs (2/12): K 4.6, creatinine 0.9, ALT 23, AST 33  Allergies (verified):  No Known Drug Allergies  Past Medical History: 1. CAD: Inferior MI 12/11.  LHC with occluded mid CFX and 80% proximal RCA.  EF 55%.  He had 3.0 x 28 VIsion BMS to CFX.  2. Diastolic CHF: Echo (56/31) with EF 50-55% and mild LVH.  EF 55% by LV-gram in 12/11.  3. Hypertension 4. Type II diabetes 5. Cirrhosis: Cryptogenic, ? NASH.  Family and patient deny ETOH.  HCV, HBV, and HAV workup negative.  ANA negative.  AMA positive.  He had grade II esophageal varices on EGD with no evidence for bleeding in 12/11.  He had hepatic encephalopaty and ascites in 12/11.  6. SVT (1/12): appeared to be an ectopic atrial tachycardia.  Required DCCV with hemodynamic instability.  7. GI bleed (12/11): Etiology not clearly defined.  EGD showed nonbleeding esophageal varices.   Family History: Father:Deceased age 99. CABG age 58. Mother:Living. Siblings: 3 brothers & 1 sister living. 1 brother MI age 42 1 brother MI age 29  Social History: Unemployed, worked in Theatre manager prior.  Married  Tobacco Use - Former. Quit Dec. 21, 2011. Smoked x 36 years.  Has restarted about 5 cigs/day recently. Alcohol Use - no Regular Exercise - yes--walk  Review of Systems        All systems reviewed and negative except as per HPI.   Current Outpatient Prescriptions  Medication Sig Dispense Refill  . aspirin 81  MG tablet Take 81 mg by mouth daily.        . clopidogrel (PLAVIX) 75 MG tablet Take 75 mg by mouth daily.        . furosemide (LASIX) 40 MG tablet Take 40 mg by mouth daily.        Marland Kitchen ipratropium-albuterol (DUONEB) 0.5-2.5 (3) MG/3ML SOLN Take 3 mLs by nebulization every 2 (two) hours as needed.        Marland Kitchen lisinopril (PRINIVIL,ZESTRIL) 2.5 MG tablet Take 1 tablet (2.5 mg total) by mouth daily.  30 tablet  6  . metFORMIN (GLUCOPHAGE) 500 MG tablet Take 500 mg by mouth 3 (three) times daily with meals.       . Multiple Vitamin  (MULTIVITAMIN) capsule Take 1 capsule by mouth daily.        . nadolol (CORGARD) 20 MG tablet Take 20 mg by mouth daily.       . nitroGLYCERIN (NITROSTAT) 0.4 MG SL tablet Place 0.4 mg under the tongue every 5 (five) minutes as needed.        . pantoprazole (PROTONIX) 40 MG tablet Take 40 mg by mouth daily.        . pravastatin (PRAVACHOL) 40 MG tablet Take 1 tablet (40 mg total) by mouth daily.  30 tablet  6  . spironolactone (ALDACTONE) 50 MG tablet Take 50 mg by mouth daily.          BP 124/78  Pulse 79  Ht 5' 7"  (1.702 m)  Wt 233 lb (105.688 kg)  BMI 36.49 kg/m2 General:  Well developed, well nourished, in no acute distress.  Obese.  Neck:  Neck supple, no JVD. No masses, thyromegaly or abnormal cervical nodes. Lungs:  Distant breath sounds bilaterally.  Heart:  Non-displaced PMI, chest non-tender; regular rate and rhythm, S1, S2 without murmurs, rubs or gallops. Carotid upstroke normal, no bruit.  Pedals normal pulses. Trace ankle edema. Abdomen:  Bowel sounds positive; abdomen soft and non-tender without masses, organomegaly, or hernias noted. No hepatosplenomegaly. Extremities:  No clubbing or cyanosis. Neurologic:  Alert and oriented x 3. Psych:  Normal affect.

## 2011-01-05 NOTE — Assessment & Plan Note (Signed)
Needs to followup with Dr. Deatra Ina.  ? primary biliary cirrhosis based on elevated AMA titer.

## 2011-01-05 NOTE — Assessment & Plan Note (Signed)
Check lipids/LFTs with goal LDL < 70.

## 2011-01-05 NOTE — Telephone Encounter (Signed)
Spoke to pt, notified him of msg below.

## 2011-01-05 NOTE — Assessment & Plan Note (Signed)
Preserved LV systolic function post-MI.  He had significant volume overload with some ascites while in the hospital.  He is now on Lasix and spironolactone.  He does not appear volume overloaded today though he has NYHA class II-III symptoms.  Continue current Lasix and spironolactone for now.  Will get BNP.

## 2011-01-05 NOTE — Assessment & Plan Note (Signed)
Status post BMS to CFX with inferoposterior MI in 12/11.  He had an undefined GI bleed while in the hospital but has had no overt bleeding since getting home.  He had an 80% RCA stenosis that was not intervened upon.   He does get chest pain with heavy exertion and short of breath with moderate exertion.  - ETT-myoview (now has insurance coverage) to look for residual ischemia.  - Continue Plavix, ASA, lisinopril, and nadolol.

## 2011-01-10 ENCOUNTER — Other Ambulatory Visit (INDEPENDENT_AMBULATORY_CARE_PROVIDER_SITE_OTHER): Payer: Medicaid Other | Admitting: *Deleted

## 2011-01-10 DIAGNOSIS — Z79899 Other long term (current) drug therapy: Secondary | ICD-10-CM

## 2011-01-10 DIAGNOSIS — I2510 Atherosclerotic heart disease of native coronary artery without angina pectoris: Secondary | ICD-10-CM

## 2011-01-10 DIAGNOSIS — R0602 Shortness of breath: Secondary | ICD-10-CM

## 2011-01-10 DIAGNOSIS — E785 Hyperlipidemia, unspecified: Secondary | ICD-10-CM

## 2011-01-10 MED ORDER — NADOLOL 20 MG PO TABS: 20.0000 mg | ORAL_TABLET | Freq: Every day | ORAL | Status: DC

## 2011-01-11 LAB — LIPID PANEL
LDL Cholesterol: 89 mg/dL (ref 0–99)
VLDL: 14 mg/dL (ref 0–40)

## 2011-01-11 LAB — BASIC METABOLIC PANEL
BUN: 24 mg/dL — ABNORMAL HIGH (ref 6–23)
Chloride: 102 mEq/L (ref 96–112)
Glucose, Bld: 157 mg/dL — ABNORMAL HIGH (ref 70–99)
Potassium: 4.1 mEq/L (ref 3.5–5.3)

## 2011-01-11 LAB — HEPATIC FUNCTION PANEL
Bilirubin, Direct: 0.2 mg/dL (ref 0.0–0.3)
Indirect Bilirubin: 0.4 mg/dL (ref 0.0–0.9)
Total Bilirubin: 0.6 mg/dL (ref 0.3–1.2)
Total Protein: 7.8 g/dL (ref 6.0–8.3)

## 2011-01-13 ENCOUNTER — Ambulatory Visit (HOSPITAL_COMMUNITY): Payer: Medicaid Other | Attending: Cardiology | Admitting: Radiology

## 2011-01-13 ENCOUNTER — Other Ambulatory Visit: Payer: Self-pay | Admitting: Cardiology

## 2011-01-13 DIAGNOSIS — R61 Generalized hyperhidrosis: Secondary | ICD-10-CM | POA: Insufficient documentation

## 2011-01-13 DIAGNOSIS — F17200 Nicotine dependence, unspecified, uncomplicated: Secondary | ICD-10-CM | POA: Insufficient documentation

## 2011-01-13 DIAGNOSIS — I2510 Atherosclerotic heart disease of native coronary artery without angina pectoris: Secondary | ICD-10-CM

## 2011-01-13 DIAGNOSIS — I4949 Other premature depolarization: Secondary | ICD-10-CM

## 2011-01-13 DIAGNOSIS — E119 Type 2 diabetes mellitus without complications: Secondary | ICD-10-CM | POA: Insufficient documentation

## 2011-01-13 DIAGNOSIS — I240 Acute coronary thrombosis not resulting in myocardial infarction: Secondary | ICD-10-CM

## 2011-01-13 DIAGNOSIS — R0789 Other chest pain: Secondary | ICD-10-CM | POA: Insufficient documentation

## 2011-01-13 DIAGNOSIS — I10 Essential (primary) hypertension: Secondary | ICD-10-CM | POA: Insufficient documentation

## 2011-01-13 DIAGNOSIS — E669 Obesity, unspecified: Secondary | ICD-10-CM | POA: Insufficient documentation

## 2011-01-13 DIAGNOSIS — R000 Tachycardia, unspecified: Secondary | ICD-10-CM | POA: Insufficient documentation

## 2011-01-13 DIAGNOSIS — Z8249 Family history of ischemic heart disease and other diseases of the circulatory system: Secondary | ICD-10-CM | POA: Insufficient documentation

## 2011-01-13 DIAGNOSIS — R0609 Other forms of dyspnea: Secondary | ICD-10-CM | POA: Insufficient documentation

## 2011-01-13 DIAGNOSIS — R0989 Other specified symptoms and signs involving the circulatory and respiratory systems: Secondary | ICD-10-CM | POA: Insufficient documentation

## 2011-01-13 MED ORDER — TECHNETIUM TC 99M TETROFOSMIN IV KIT
11.0000 | PACK | Freq: Once | INTRAVENOUS | Status: AC | PRN
  Administered 2011-01-13: 11 via INTRAVENOUS

## 2011-01-13 MED ORDER — TECHNETIUM TC 99M TETROFOSMIN IV KIT
33.0000 | PACK | Freq: Once | INTRAVENOUS | Status: AC | PRN
  Administered 2011-01-13: 33 via INTRAVENOUS

## 2011-01-13 MED ORDER — REGADENOSON 0.4 MG/5ML IV SOLN
0.4000 mg | Freq: Once | INTRAVENOUS | Status: AC
  Administered 2011-01-13: 0.4 mg via INTRAVENOUS

## 2011-01-13 MED ORDER — PRAVASTATIN SODIUM 80 MG PO TABS: 80.0000 mg | ORAL_TABLET | Freq: Every day | ORAL | Status: DC

## 2011-01-13 NOTE — Progress Notes (Addendum)
[place] [address] [phone]  Cardiology Nuclear Med Study  RASHAN ROUNSAVILLE is a 55 y.o. male [id number] 07-Jan-1956   Nuclear Med Background Indication for Stress Test:  Evaluation for Ischemia and Stent Patency History:  12/11 Inferior STEMI>Stent-Cfx, EF=55%; 12/11 Echo:EF=50-55%, mild LVH, TR, H/O SVT. Cardiac Risk Factors: Family History - CAD, Hypertension, Lipids, NIDDM, Obesity and Smoker  Symptoms:  Chest Tightness with and without Exertion (last episode of chest discomfort was in the lobby this morning and while under the camera today), Diaphoresis, DOE and Rapid HR   Nuclear Pre-Procedure Caffeine/Decaff Intake:  None NPO After: 7:30pm   Lungs:  Clear.  O2 sat 96% on RA. IV 0.9% NS with Angio Cath:  20g  IV Site: R Hand  IV Started by:  Eliezer Lofts, EMT-P  Chest Size (in):  50 Cup Size: n/a  Height: 5' 7"  (1.702 m)  Weight:  235 lb (106.595 kg)  BMI:  Body mass index is 36.81 kg/(m^2). Tech Comments:  Patient advised meds held/taken as advised. (Unable to confirm by memory)    Nuclear Med Study 1 or 2 day study: 1 day  Stress Test Type:  Treadmill/Lexiscan  Reading MD: Dola Argyle, MD  Order Authorizing Provider:  Loralie Champagne, MD  Resting Radionuclide: Technetium 41mTetrofosmin  Resting Radionuclide Dose: 10.9 mCi   Stress Radionuclide:  Technetium 962metrofosmin  Stress Radionuclide Dose: 33 mCi           Stress Protocol Rest HR: 65 Stress HR: 105  Rest BP: 105/57 Stress BP: 159/58  Exercise Time (min): 6:31 METS: 5.3   Predicted Max HR: 166 bpm % Max HR: 63.25 bpm Rate Pressure Product: 16695   Dose of Adenosine (mg):  n/a Dose of Lexiscan: 0.4 mg  Dose of Atropine (mg): n/a Dose of Dobutamine: n/a mcg/kg/min (at max HR)  Stress Test Technologist: ShLetta MoynahanCMA-N  Nuclear Technologist:  StCharlton AmorCNMT     Rest Procedure:  Myocardial perfusion imaging was performed  at rest 45 minutes following the intravenous administration of Technetium 9919mtrofosmin.   Rest ECG: Prior ILWMI and occasional PVC's.  Stress Procedure:  The patient attempted to walk the treadmill utilizing the Bruce protocol for 4:30, but was unable to obtain his target heart rate.  He was then given IV Lexiscan 0.4 mg over 15-seconds with concurrent low level exercise and then Technetium 74m19mrofosmin was injected at 30-seconds while the patient continued walking one more minute.  There were no diagnostic ST-T wave changes with Lexiscan, only occasional PVC's.  He did c/o chest tightness, 9/10, with infusion; relieved with rest.  Quantitative spect images were obtained after a 45-minute delay.  Stress ECG: No significant change from baseline ECG  QPS Raw Data Images:  Patient motion noted; appropriate software correction applied. Stress Images:  Moderately severe decrease in uptake in the inferolateral wall (base,mid, apical) . Rest Images:  Same as stress. Subtraction (SDS):  No evidence of ischemia. Transient Ischemic Dilatation (Normal <1.22):  1.09 Lung/Heart Ratio (Normal <0.45):  .21  Quantitative Gated Spect Images QGS EDV:  145 ml QGS ESV:  67 ml QGS cine images:  Mild hypokinesis of the inferior wall. QGS EF: 54%  Impression Exercise Capacity:  Lexiscan with low level exercise. BP Response:  Normal blood pressure response. Clinical Symptoms:  Chest pain. ECG Impression:  No significant ST segment change suggestive of ischemia. Comparison with Prior Nuclear Study: No previous  nuclear study performed  Overall Impression:  There was chest pain with low level exercise, but no EKG change. There is old moderately large inferolateral scar. There is no significant ischemia.  Dola Argyle   Inferolateral scar with no ischemia.  Continue medical management.  [place] [employer]

## 2011-01-14 NOTE — Progress Notes (Signed)
ROUTED TO DR. Aundra Dubin.Baxter Flattery

## 2011-01-18 NOTE — Progress Notes (Signed)
Pt notified of results

## 2011-02-21 ENCOUNTER — Other Ambulatory Visit: Payer: Self-pay | Admitting: Cardiology

## 2011-02-21 MED ORDER — FUROSEMIDE 40 MG PO TABS: 40.0000 mg | ORAL_TABLET | Freq: Every day | ORAL | Status: DC

## 2011-02-21 NOTE — Telephone Encounter (Signed)
Requested a refill for fuorsemide.

## 2011-03-08 ENCOUNTER — Encounter: Payer: Self-pay | Admitting: Cardiology

## 2011-03-10 ENCOUNTER — Telehealth: Payer: Self-pay | Admitting: *Deleted

## 2011-03-10 ENCOUNTER — Encounter: Payer: Self-pay | Admitting: Cardiology

## 2011-03-10 ENCOUNTER — Ambulatory Visit (INDEPENDENT_AMBULATORY_CARE_PROVIDER_SITE_OTHER): Payer: Medicaid Other | Admitting: Cardiology

## 2011-03-10 DIAGNOSIS — I2510 Atherosclerotic heart disease of native coronary artery without angina pectoris: Secondary | ICD-10-CM

## 2011-03-10 DIAGNOSIS — I509 Heart failure, unspecified: Secondary | ICD-10-CM

## 2011-03-10 DIAGNOSIS — I5032 Chronic diastolic (congestive) heart failure: Secondary | ICD-10-CM

## 2011-03-10 DIAGNOSIS — E785 Hyperlipidemia, unspecified: Secondary | ICD-10-CM

## 2011-03-10 DIAGNOSIS — K7460 Unspecified cirrhosis of liver: Secondary | ICD-10-CM

## 2011-03-10 MED ORDER — LISINOPRIL 5 MG PO TABS: 5.0000 mg | ORAL_TABLET | Freq: Every day | ORAL | Status: DC

## 2011-03-10 MED ORDER — PANTOPRAZOLE SODIUM 40 MG PO TBEC: 40.0000 mg | DELAYED_RELEASE_TABLET | Freq: Every day | ORAL | Status: DC

## 2011-03-10 NOTE — Assessment & Plan Note (Signed)
Repeat lipids/LFTs now that he is on higher dose of pravastatin.  Goal LDL < 70.

## 2011-03-10 NOTE — Progress Notes (Signed)
PCP: Dr. Sheryle Hail, [place]   55 yo with complicated admission to [facility] in 12/11 returns for followup.  He entered the hospital in 12/11 with an inferior MI and had a bare metal stent to an occluded circumflex.  His hospitalization was then complicated by GI bleeding requiring transfusion and altered mental status thought to be due to hepatic encephalopathy.  He was intubated due to altered mental status to protect his airway.  He developed ventilator-associated PNA and CHF, requiring antibiotics and diuresis.  He was noted to have findings consistent with cirrhosis on imaging, but family/patient denied ETOH and viral hepatitis workup was unrevealing.  He was finally discharged home after a prolonged course.  He has had trouble getting medical care due to lack of insurance but has recently gotten Medicaid and disability.  He has seen Dr. Deatra Ina for workup of cirrhosis.  His AMA titer was elevated, raising the question of primary biliary cirrhosis.   Patient continues to smoke around 5 cigarettes/day.  Weight is up by another 11 lbs.  He is not very active and does not really watch what he eats.  He is short of breath after walking 1/4 - 3/4 mile depending on the day or with walking up a flight of steps (though he is able to make it to the top).  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  He is now on disability.  He is having chest pain only with emotional stress (when he gets angry with someone).  Given the residual RCA stenosis on catheterization last winter, I had him do a Lexiscan myoview to look for ischemia.  This showed moderate inferolateral scar consistent with prior MI and no ischemia.   He has not had followup with GI for cirrhosis evaluation (needs referral he says).  No melena, no BRBPR.   ECG: NSR, old inferoposterior MI, old anterolateral MI.   Labs (12/11): HBsAg negative, HAV ab negative, HCV ab negative, HBcAb negative, HVC RNA  negative, ceruloplasmin level normal, ANA negative, anti-mitochondrial ab level elevated.  Labs (1/12): K 3.9, creatinine 0.75, HCT 28.1, AST 40, ALT 29 Labs (2/12): K 4.6, creatinine 0.9, ALT 23, AST 33 Labs (7/12): K 4.1, creatinine 0.8, BNP 28, AST/ALT normal, LDL 89, HDL 62  Allergies (verified):  No Known Drug Allergies  Past Medical History: 1. CAD: Inferior MI 12/11.  LHC with occluded mid CFX and 80% proximal RCA.  EF 55%.  He had 3.0 x 28 VIsion BMS to CFX.  Lexiscan myoview (7/12): EF 54%, moderate inferolateral scar with no ischemia.  2. Diastolic CHF: Echo (81/27) with EF 50-55% and mild LVH.  EF 55% by LV-gram in 12/11.  3. Hypertension 4. Type II diabetes 5. Cirrhosis: Cryptogenic, ? NASH.  Family and patient deny ETOH.  HCV, HBV, and HAV workup negative.  ANA negative.  AMA positive.  He had grade II esophageal varices on EGD with no evidence for bleeding in 12/11.  He had hepatic encephalopaty and ascites in 12/11.  6. SVT (1/12): appeared to be an ectopic atrial tachycardia.  Required DCCV with hemodynamic instability.  7. GI bleed (12/11): Etiology not clearly defined.  EGD showed nonbleeding esophageal varices.   Family History: Father:Deceased age 85. CABG age 4. Mother:Living. Siblings: 3 brothers & 1 sister living. 1 brother MI age 25 1 brother MI age 66  Social History: Unemployed, worked in Theatre manager prior.  Married  Tobacco Use - Former. Quit Dec. 21,  2011. Smoked x 36 years.  Has restarted about 5 cigs/day recently. Alcohol Use - no Regular Exercise - yes--walk  Review of Systems        All systems reviewed and negative except as per HPI.   Current Outpatient Prescriptions  Medication Sig Dispense Refill  . aspirin 81 MG tablet Take 81 mg by mouth daily.        . clopidogrel (PLAVIX) 75 MG tablet Take 75 mg by mouth daily.        . furosemide (LASIX) 40 MG tablet Take 1 tablet (40 mg total) by mouth daily.  30 tablet  6  . glipiZIDE (GLUCOTROL XL)  10 MG 24 hr tablet Take 10 mg by mouth 2 (two) times daily.        Marland Kitchen lisinopril (PRINIVIL,ZESTRIL) 5 MG tablet Take 1 tablet (5 mg total) by mouth daily.  30 tablet  6  . metFORMIN (GLUCOPHAGE) 500 MG tablet Take 500 mg by mouth 3 (three) times daily with meals.       . Multiple Vitamin (MULTIVITAMIN) capsule Take 1 capsule by mouth daily.        . nadolol (CORGARD) 20 MG tablet Take by mouth daily. Take 1/2 tablet daily.      . nitroGLYCERIN (NITROSTAT) 0.4 MG SL tablet Place 0.4 mg under the tongue every 5 (five) minutes as needed.        . pantoprazole (PROTONIX) 40 MG tablet Take 1 tablet (40 mg total) by mouth daily.  30 tablet  6  . pravastatin (PRAVACHOL) 80 MG tablet Take 1 tablet (80 mg total) by mouth daily.  30 tablet  6  . spironolactone (ALDACTONE) 50 MG tablet Take 25 mg by mouth 2 (two) times daily.         BP 119/74  Pulse 72  Ht 5' 7"  (1.702 m)  Wt 244 lb (110.678 kg)  BMI 38.22 kg/m2 General:  Well developed, well nourished, in no acute distress.  Obese.  Neck:  Neck supple, no JVD. No masses, thyromegaly or abnormal cervical nodes. Lungs:  Distant breath sounds bilaterally.  Heart:  Non-displaced PMI, chest non-tender; regular rate and rhythm, S1, S2 without murmurs, rubs or gallops. Carotid upstroke normal, no bruit.  Pedals normal pulses. Trace ankle edema. Abdomen:  Bowel sounds positive; abdomen soft and non-tender without masses, organomegaly, or hernias noted. No hepatosplenomegaly. Extremities:  No clubbing or cyanosis. Neurologic:  Alert and oriented x 3. Psych:  Normal affect.

## 2011-03-10 NOTE — Assessment & Plan Note (Addendum)
Status post PCI to CFX in setting of MI.  Myoview was done to assess for residual ischemia given 80% RCA stenosis on cath that was not treated.  No ischemia on myoview in 7/12 (inferolateral infarction), so will continue medical management.  Continue ASA 81 mg daily.  Will keep patient on Plavix until at least the end of 12/12.  He has had no further evidence of GI bleeding. He is also on ACEI, beta blocker, and statin.  He will increase the lisinopril from 2.5 mg daily to 5 mg daily.  Now that he has insurance, I am going to have him start cardiac rehab at [facility].

## 2011-03-10 NOTE — Telephone Encounter (Signed)
Notified pt's wife of his referral to Dr. Deatra Ina, appt is for 04/14/11 @ 9:45 AM. Pt instructed to call back if he needs to reschedule.

## 2011-03-10 NOTE — Patient Instructions (Signed)
You have been referred to Dr. Deatra Ina with [facility]. We will call you with an appt.  We will refer you to cardiac rehab at [facility], you will be contacted with this information.  Your physician has recommended you make the following change in your medication: INCREASE Lisinopril to 40m daily.  Please return for FASTING labs 03/15/11: (BMP/CBC/Lipid/Lft)  Your physician recommends that you schedule a follow-up appointment in: 2 months with Dr. [place]undra DubinIN [place]:

## 2011-03-10 NOTE — Assessment & Plan Note (Signed)
Needs to followup with Dr. Deatra Ina.  ? primary biliary cirrhosis based on elevated AMA titer.  I am going to make a referral to GI so he can get followup.

## 2011-03-10 NOTE — Assessment & Plan Note (Signed)
Preserved LV systolic function post-MI.  He had significant volume overload with some ascites while in the hospital.  He is now on Lasix and spironolactone.  He does not appear volume overloaded today though he has NYHA class II-III symptoms.  Continue current Lasix and spironolactone for now.

## 2011-03-15 ENCOUNTER — Ambulatory Visit (INDEPENDENT_AMBULATORY_CARE_PROVIDER_SITE_OTHER): Payer: Self-pay | Admitting: *Deleted

## 2011-03-15 DIAGNOSIS — I2510 Atherosclerotic heart disease of native coronary artery without angina pectoris: Secondary | ICD-10-CM

## 2011-03-15 DIAGNOSIS — E785 Hyperlipidemia, unspecified: Secondary | ICD-10-CM

## 2011-03-15 DIAGNOSIS — I5032 Chronic diastolic (congestive) heart failure: Secondary | ICD-10-CM

## 2011-03-15 LAB — LIPID PANEL
Cholesterol: 132 mg/dL (ref 0–200)
HDL: 54 mg/dL (ref >39)
Total CHOL/HDL Ratio: 2.4 Ratio
Triglycerides: 41 mg/dL (ref <150)

## 2011-03-15 LAB — HEPATIC FUNCTION PANEL
ALT: 32 U/L (ref 0–53)
AST: 35 U/L (ref 0–37)
Bilirubin, Direct: 0.2 mg/dL (ref 0.0–0.3)

## 2011-03-15 LAB — BASIC METABOLIC PANEL
Potassium: 4.6 mEq/L (ref 3.5–5.3)
Sodium: 139 mEq/L (ref 135–145)

## 2011-03-16 LAB — CBC
MCH: 32.5 pg (ref 26.0–34.0)
Platelets: 93 10*3/uL — ABNORMAL LOW (ref 150–400)
RBC: 4.28 MIL/uL (ref 4.22–5.81)
WBC: 5.5 10*3/uL (ref 4.0–10.5)

## 2011-03-25 ENCOUNTER — Other Ambulatory Visit: Payer: Self-pay

## 2011-03-25 DIAGNOSIS — K7460 Unspecified cirrhosis of liver: Secondary | ICD-10-CM

## 2011-03-25 DIAGNOSIS — I2510 Atherosclerotic heart disease of native coronary artery without angina pectoris: Secondary | ICD-10-CM

## 2011-03-25 DIAGNOSIS — E785 Hyperlipidemia, unspecified: Secondary | ICD-10-CM

## 2011-03-25 DIAGNOSIS — I5032 Chronic diastolic (congestive) heart failure: Secondary | ICD-10-CM

## 2011-04-14 ENCOUNTER — Ambulatory Visit: Payer: Medicaid Other | Admitting: Gastroenterology

## 2011-05-10 ENCOUNTER — Ambulatory Visit: Payer: Medicaid Other | Admitting: Cardiology

## 2011-05-10 ENCOUNTER — Telehealth: Payer: Self-pay | Admitting: *Deleted

## 2011-05-10 MED ORDER — FUROSEMIDE 40 MG PO TABS: 40.0000 mg | ORAL_TABLET | Freq: Every day | ORAL | Status: DC

## 2011-05-10 MED ORDER — CLOPIDOGREL BISULFATE 75 MG PO TABS: 75.0000 mg | ORAL_TABLET | Freq: Every day | ORAL | Status: DC

## 2011-05-10 NOTE — Telephone Encounter (Signed)
Pt's wife requesting refill for Plavix and Furosemide. Pt ran out today of Plavix. Samples left at front of office for pick up so he does not miss any doses.

## 2011-05-11 ENCOUNTER — Telehealth: Payer: Self-pay | Admitting: *Deleted

## 2011-05-11 NOTE — Telephone Encounter (Signed)
Spoke to pt, he is scheduled first of January with Dr. Sheryle Hail at [facility] and they will check CMet, CBC, Lipid. I will get these results at that time per pt request. Pt will call for f/u at later time.

## 2011-05-11 NOTE — Telephone Encounter (Signed)
Message copied by Solmon Ice on Wed May 11, 2011 11:29 AM ------      Message from: Larey Dresser      Created: Wed May 11, 2011 12:06 AM       Needs BMET in December with use of spironolactone.  Followup when he can.             ----- Message -----         From: Elio Forget, RN         Sent: 05/10/2011  11:09 AM           To: Loralie Champagne, MD            FYI, pt's wife called to let me know he cancelled appt today with you. (Does not have medicaid anymore and cannot afford appt right now). I just wanted to see if there's anything you wanted me to do since he's not coming in, labs...etc? Thanks.

## 2011-12-19 ENCOUNTER — Telehealth (HOSPITAL_COMMUNITY): Payer: Self-pay

## 2011-12-19 ENCOUNTER — Telehealth: Payer: Self-pay | Admitting: Cardiology

## 2011-12-19 ENCOUNTER — Other Ambulatory Visit (HOSPITAL_COMMUNITY): Payer: Self-pay

## 2011-12-19 MED ORDER — CLOPIDOGREL BISULFATE 75 MG PO TABS: 75.0000 mg | ORAL_TABLET | Freq: Every day | ORAL | Status: DC

## 2011-12-19 NOTE — Telephone Encounter (Signed)
New Problem:    Patient called in needing a refill of clopidogrel (PLAVIX) 75 MG tablet filled at his new permanent pharmacy [facility] (phone [phone]).  Please call once the order has been placed.

## 2011-12-19 NOTE — Telephone Encounter (Signed)
..   Requested Prescriptions   Signed Prescriptions Disp Refills  . clopidogrel (PLAVIX) 75 MG tablet 30 tablet 0    Sig: Take 1 tablet (75 mg total) by mouth daily.    Authorizing Provider: Larey Dresser    Ordering User: Ardis Hughs, Kendra Grissett M  Patient has appointment 01/09/2012

## 2011-12-19 NOTE — Telephone Encounter (Signed)
  let patient know that his refill for plavix 75 mg was filled for 30 day supply no refills , patient has appointment on 01/09/2072

## 2012-01-09 ENCOUNTER — Ambulatory Visit (INDEPENDENT_AMBULATORY_CARE_PROVIDER_SITE_OTHER): Payer: Self-pay | Admitting: Cardiology

## 2012-01-09 ENCOUNTER — Encounter: Payer: Self-pay | Admitting: Cardiology

## 2012-01-09 VITALS — BP 136/80 | HR 84 | Ht 67.0 in | Wt 275.0 lb

## 2012-01-09 DIAGNOSIS — I2510 Atherosclerotic heart disease of native coronary artery without angina pectoris: Secondary | ICD-10-CM

## 2012-01-09 DIAGNOSIS — F17200 Nicotine dependence, unspecified, uncomplicated: Secondary | ICD-10-CM

## 2012-01-09 DIAGNOSIS — E785 Hyperlipidemia, unspecified: Secondary | ICD-10-CM

## 2012-01-09 DIAGNOSIS — I5032 Chronic diastolic (congestive) heart failure: Secondary | ICD-10-CM

## 2012-01-09 DIAGNOSIS — E669 Obesity, unspecified: Secondary | ICD-10-CM

## 2012-01-09 MED ORDER — LISINOPRIL 5 MG PO TABS: 5.0000 mg | ORAL_TABLET | Freq: Every day | ORAL | Status: DC

## 2012-01-09 MED ORDER — FUROSEMIDE 40 MG PO TABS: 40.0000 mg | ORAL_TABLET | Freq: Every day | ORAL | Status: DC

## 2012-01-09 MED ORDER — NADOLOL 20 MG PO TABS: 20.0000 mg | ORAL_TABLET | Freq: Every day | ORAL | Status: DC

## 2012-01-09 MED ORDER — NITROGLYCERIN 0.4 MG SL SUBL: 0.4000 mg | SUBLINGUAL_TABLET | SUBLINGUAL | Status: DC | PRN

## 2012-01-09 NOTE — Patient Instructions (Addendum)
Stop plavix.  Stop spironolactone.  Ask Dr Sheryle Hail to fax copies of your lab to Dr Aundra Dubin when you have them done in August.  Your physician wants you to follow-up in: 6 months with Dr Aundra Dubin. (January 2014).You will receive a reminder letter in the mail two months in advance. If you don't receive a letter, please call our office to schedule the follow-up appointment.

## 2012-01-10 DIAGNOSIS — E669 Obesity, unspecified: Secondary | ICD-10-CM | POA: Insufficient documentation

## 2012-01-10 DIAGNOSIS — F17200 Nicotine dependence, unspecified, uncomplicated: Secondary | ICD-10-CM | POA: Insufficient documentation

## 2012-01-10 NOTE — Assessment & Plan Note (Signed)
Goal LDL < 70 with CAD.  He will have lipids at the [facility] in 8/13.  He will have them fax the results over to this office.

## 2012-01-10 NOTE — Assessment & Plan Note (Signed)
I again encouraged him to stop.  I don't think he is ready.

## 2012-01-10 NOTE — Assessment & Plan Note (Signed)
Stable with no ischemic symptoms.  He will continue ASA 81, lisinopril 5, nadolol 10, and pravastatin.  At this point, he can stop Plavix.

## 2012-01-10 NOTE — Progress Notes (Signed)
Patient ID: Adam Butler, male   DOB: Oct 11, 1955, 56 y.o.   MRN: [id number] PCP: Dr. Pattricia Boss, [place]   56 yo with complicated admission to [facility] in 12/11 returns for followup.  He entered the hospital in 12/11 with an inferior MI and had a bare metal stent to an occluded circumflex.  His hospitalization was then complicated by GI bleeding requiring transfusion and altered mental status thought to be due to hepatic encephalopathy.  He was intubated due to altered mental status to protect his airway.  He developed ventilator-associated PNA and CHF, requiring antibiotics and diuresis.  He was noted to have findings consistent with cirrhosis on imaging, but family/patient denied ETOH and viral hepatitis workup was unrevealing.  He was finally discharged home after a prolonged course.  He has had trouble getting medical care due to lack of insurance.  His AMA titer was elevated, raising the question of primary biliary cirrhosis. Given the residual RCA stenosis on catheterization in 12/11, I had him do a Lexiscan myoview in 7/12 to look for ischemia.  This showed moderate inferolateral scar consistent with prior MI and no ischemia.     Patient continues to smoke around 5 cigarettes/day.  Weight is up by 31 lbs since I last saw him around a year ago.  He is not very active and does not really watch what he eats.  He is short of breath after walking 1/4 - 3/4 mile depending on the day or with walking up a flight of steps (though he is able to make it to the top).  He is now on disability but does not have Medicaid.  No chest pain or NTG use.  He has not had followup with GI for cirrhosis evaluation.  No melena, no BRBPR.    Labs (12/11): HBsAg negative, HAV ab negative, HCV ab negative, HBcAb negative, HVC RNA negative, ceruloplasmin level normal, ANA negative, anti-mitochondrial ab level elevated.  Labs (1/12): K 3.9, creatinine 0.75, HCT 28.1, AST 40, ALT 29 Labs (2/12): K 4.6, creatinine 0.9, ALT  23, AST 33 Labs (7/12): K 4.1, creatinine 0.8, BNP 28, AST/ALT normal, LDL 89, HDL 62 Labs (9/12): K 4.6, creatinine 0.74  Allergies (verified):  No Known Drug Allergies  Past Medical History: 1. CAD: Inferior MI 12/11.  LHC with occluded mid CFX and 80% proximal RCA.  EF 55%.  He had 3.0 x 28 VIsion BMS to CFX.  Lexiscan myoview (7/12): EF 54%, moderate inferolateral scar with no ischemia.  2. Diastolic CHF: Echo (50/09) with EF 50-55% and mild LVH.  EF 55% by LV-gram in 12/11.  3. Hypertension 4. Type II diabetes 5. Cirrhosis: Cryptogenic, ? NASH.  Family and patient deny ETOH.  HCV, HBV, and HAV workup negative.  ANA negative.  AMA positive.  He had grade II esophageal varices on EGD with no evidence for bleeding in 12/11.  He had hepatic encephalopaty and ascites in 12/11.  6. SVT (1/12): appeared to be an ectopic atrial tachycardia.  Required DCCV with hemodynamic instability.  7. GI bleed (12/11): Etiology not clearly defined.  EGD showed nonbleeding esophageal varices.  8. Obesity  Family History: Father:Deceased age 27. CABG age 30. Mother:Living. Siblings: 3 brothers & 1 sister living. 1 brother MI age 90 1 brother MI age 37  Social History: Unemployed, worked in Theatre manager prior.  Married  Tobacco Use - Former. Quit Dec. 21, 2011. Smoked x 36 years.  Has restarted about 5 cigs/day recently. Alcohol Use - no Regular Exercise -  yes--walk   Current Outpatient Prescriptions  Medication Sig Dispense Refill  . aspirin 81 MG tablet Take 81 mg by mouth daily.        . furosemide (LASIX) 40 MG tablet Take 1 tablet (40 mg total) by mouth daily.  30 tablet  6  . glipiZIDE (GLUCOTROL XL) 10 MG 24 hr tablet Take 10 mg by mouth 2 (two) times daily.        Marland Kitchen lisinopril (PRINIVIL,ZESTRIL) 5 MG tablet Take 1 tablet (5 mg total) by mouth daily.  30 tablet  6  . metFORMIN (GLUCOPHAGE) 500 MG tablet Take 500 mg by mouth 2 (two) times daily.       . Multiple Vitamin (MULTIVITAMIN)  capsule Take 1 capsule by mouth daily.        . nadolol (CORGARD) 20 MG tablet Take 1 tablet (20 mg total) by mouth daily. Take 1/2 tablet daily.  30 tablet  6  . nitroGLYCERIN (NITROSTAT) 0.4 MG SL tablet Place 1 tablet (0.4 mg total) under the tongue every 5 (five) minutes as needed.  30 tablet  6  . pravastatin (PRAVACHOL) 80 MG tablet Take 1 tablet (80 mg total) by mouth daily.  30 tablet  6    BP 136/80  Pulse 84  Ht 5' 7"  (1.702 m)  Wt 124.739 kg (275 lb)  BMI 43.07 kg/m2 General:  Well developed, well nourished, in no acute distress.  Obese.  Neck:  Neck supple, no JVD. No masses, thyromegaly or abnormal cervical nodes. Lungs:  Distant breath sounds bilaterally.  Heart:  Non-displaced PMI, chest non-tender; regular rate and rhythm, S1, S2 without murmurs, rubs or gallops. Carotid upstroke normal, no bruit.  Pedals normal pulses. Trace ankle edema. Abdomen:  Bowel sounds positive; abdomen soft and non-tender without masses, organomegaly, or hernias noted. No hepatosplenomegaly. Extremities:  No clubbing or cyanosis. Neurologic:  Alert and oriented x 3. Psych:  Normal affect.

## 2012-01-10 NOTE — Assessment & Plan Note (Signed)
Weight continues to go up.  He continues to be inactive with a very poor diet.  I talked to him extensively today about doing some walking and changing up his diet.

## 2012-04-10 ENCOUNTER — Ambulatory Visit (INDEPENDENT_AMBULATORY_CARE_PROVIDER_SITE_OTHER): Payer: Self-pay | Admitting: Gastroenterology

## 2012-04-10 ENCOUNTER — Encounter: Payer: Self-pay | Admitting: Gastroenterology

## 2012-04-10 VITALS — BP 112/66 | HR 76 | Ht 66.5 in | Wt 272.5 lb

## 2012-04-10 DIAGNOSIS — R195 Other fecal abnormalities: Secondary | ICD-10-CM

## 2012-04-10 DIAGNOSIS — K7460 Unspecified cirrhosis of liver: Secondary | ICD-10-CM

## 2012-04-10 DIAGNOSIS — I8500 Esophageal varices without bleeding: Secondary | ICD-10-CM

## 2012-04-10 MED ORDER — NA SULFATE-K SULFATE-MG SULF 17.5-3.13-1.6 GM/177ML PO SOLN: ORAL | Status: DC

## 2012-04-10 NOTE — Assessment & Plan Note (Signed)
Patient has stable cirrhosis, presumably due to  [place].  Recommendation #1 vaccinations for hepatitis A and B. #2 surveillance of esophageal varices

## 2012-04-10 NOTE — Assessment & Plan Note (Signed)
Plan followup endoscopy

## 2012-04-10 NOTE — Patient Instructions (Addendum)
You have been given a separate informational sheet regarding your tobacco use, the importance of quitting and local resources to help you quit.  You have been scheduled for an endoscopy and colonoscopy with propofol. Please follow the written instructions given to you at your visit today. Please pick up your prep at the pharmacy within the next 1-3 days. If you use inhalers (even only as needed), please bring them with you on the day of your procedure.  We have sent the following medications to your pharmacy for you to pick up at your convenience:Suprep   You have been given the Hepatitis A&B vaccination today. You will need to come back in seven days from today to get another Hepatitis A&B injection. Then 21-30 days from the first date to get another Hepatitis A&B injection. Then one year from the first injection to get another Hepatitis A&B injection.

## 2012-04-10 NOTE — Assessment & Plan Note (Signed)
Occult GI bleeding could be do to upper GI sources including portal hypertensive gastropathy, peptic ulcer disease, AVMs, or colonic sources including polyps, AVMs, neoplasm and hemorrhoids.  Recommendations #1 upper and lower endoscopy-to be done at the same setting

## 2012-04-10 NOTE — Progress Notes (Signed)
History of Present Illness: 56 year old white male with history of cirrhosis, nonbleeding esophageal varices, diabetes, coronary artery disease, referred for hemoccult-positive stools. This was noted on routine testing. There is no history of melena, hematochezia, change of bowel habits or abdominal pain.    Past Medical History  Diagnosis Date  . Esophageal varices   . Cirrhosis     Cryptogenic, ? NASH. Family and patient deny EtOH. HCV, HBV, and HAV workup negative. ANA negative. AMA positive. Grade II esophageal varices on EGD with no evidence for bleedig in 12/11. He had hepatic encephalopaty and ascites in 12/11  . Hyperlipemia   . Chronic heart failure   . Coronary artery disease     Inferior MI 12/11; LHC with occluded mid CFX and 80% proximal RCA. EF 55%. He had 3.0 x 28 vision BMS to CFX  . Diastolic CHF, acute     Echo 12/11 with ef 50-55% and mild LVH. EF 55% by LV0gram in 12/11  . Hypertension   . Type II diabetes mellitus   . SVT (supraventricular tachycardia)     1/12: appeared to be an ectopic atrial tachycardia. Required DCCV with hemodynamic instability  . GI bleed     12/11: Etiology not clearly defined. EGD showed nonbleeding esophageal varices.   Past Surgical History  Procedure Date  . Cardiac catheterization 07/01/2010    with stent placement  . Appendectomy    family history includes COPD in his mother; Diabetes in his brother and father; Heart attack in his father; Heart attack (age of onset:36) in his brother; and Heart attack (age of onset:50) in his brother. Current Outpatient Prescriptions  Medication Sig Dispense Refill  . aspirin 81 MG tablet Take 81 mg by mouth daily.        . furosemide (LASIX) 40 MG tablet Take 1 tablet (40 mg total) by mouth daily.  30 tablet  6  . glipiZIDE (GLUCOTROL XL) 10 MG 24 hr tablet Take 10 mg by mouth 2 (two) times daily.        Marland Kitchen lisinopril (PRINIVIL,ZESTRIL) 5 MG tablet Take 1 tablet (5 mg total) by mouth daily.  30  tablet  6  . metFORMIN (GLUCOPHAGE) 500 MG tablet Take 500 mg by mouth 2 (two) times daily.       . Multiple Vitamin (MULTIVITAMIN) capsule Take 1 capsule by mouth daily.        . nadolol (CORGARD) 20 MG tablet Take 1 tablet (20 mg total) by mouth daily. Take 1/2 tablet daily.  30 tablet  6  . nitroGLYCERIN (NITROSTAT) 0.4 MG SL tablet Place 1 tablet (0.4 mg total) under the tongue every 5 (five) minutes as needed.  30 tablet  6  . pravastatin (PRAVACHOL) 80 MG tablet Take 1 tablet (80 mg total) by mouth daily.  30 tablet  6   Allergies as of 04/10/2012  . (No Known Allergies)    reports that he has been smoking Cigarettes.  He has a 36 pack-year smoking history. He has never used smokeless tobacco. He reports that he does not drink alcohol or use illicit drugs.     Review of Systems: He complains of chronic fatigue Pertinent positive and negative review of systems were noted in the above HPI section. All other review of systems were otherwise negative.  Vital signs were reviewed in today's medical record Physical Exam: General: He is an obese male no acute distress Head: Normocephalic and atraumatic Eyes:  sclerae anicteric, EOMI Ears: Normal auditory acuity Mouth:  No deformity or lesions Neck: Supple, no masses or thyromegaly Lungs: Clear throughout to auscultation Heart: Regular rate and rhythm; no murmurs, rubs or bruits Abdomen: Soft, non tender and non distended. No masses, hepatosplenomegaly or hernias noted. Normal Bowel sounds Rectal:deferred Musculoskeletal: Symmetrical with no gross deformities  Skin: No lesions on visible extremities Pulses:  Normal pulses noted Extremities: No clubbing, cyanosis,  or deformities noted. There is trace ankle edema Neurological: Alert oriented x 4, grossly nonfocal Cervical Nodes:  No significant cervical adenopathy Inguinal Nodes: No significant inguinal adenopathy Psychological:  Alert and cooperative. Normal mood and  affect

## 2012-04-13 ENCOUNTER — Ambulatory Visit (HOSPITAL_COMMUNITY)
Admission: RE | Admit: 2012-04-13 | Discharge: 2012-04-13 | Disposition: A | Discharge status: Home / Self Care | Payer: Self-pay | Source: Ambulatory Visit | Attending: Gastroenterology | Admitting: Gastroenterology

## 2012-04-13 ENCOUNTER — Encounter (HOSPITAL_COMMUNITY): Payer: Self-pay

## 2012-04-13 ENCOUNTER — Encounter (HOSPITAL_COMMUNITY)
Admission: RE | Disposition: A | Discharge status: Home / Self Care | Payer: Self-pay | Source: Ambulatory Visit | Attending: Gastroenterology

## 2012-04-13 DIAGNOSIS — R195 Other fecal abnormalities: Secondary | ICD-10-CM

## 2012-04-13 DIAGNOSIS — F17200 Nicotine dependence, unspecified, uncomplicated: Secondary | ICD-10-CM | POA: Insufficient documentation

## 2012-04-13 DIAGNOSIS — I8510 Secondary esophageal varices without bleeding: Secondary | ICD-10-CM | POA: Insufficient documentation

## 2012-04-13 DIAGNOSIS — K7460 Unspecified cirrhosis of liver: Secondary | ICD-10-CM

## 2012-04-13 DIAGNOSIS — I2510 Atherosclerotic heart disease of native coronary artery without angina pectoris: Secondary | ICD-10-CM

## 2012-04-13 DIAGNOSIS — K269 Duodenal ulcer, unspecified as acute or chronic, without hemorrhage or perforation: Secondary | ICD-10-CM

## 2012-04-13 DIAGNOSIS — Z7982 Long term (current) use of aspirin: Secondary | ICD-10-CM | POA: Insufficient documentation

## 2012-04-13 DIAGNOSIS — K319 Disease of stomach and duodenum, unspecified: Secondary | ICD-10-CM | POA: Insufficient documentation

## 2012-04-13 DIAGNOSIS — K766 Portal hypertension: Secondary | ICD-10-CM | POA: Insufficient documentation

## 2012-04-13 DIAGNOSIS — K649 Unspecified hemorrhoids: Secondary | ICD-10-CM | POA: Insufficient documentation

## 2012-04-13 DIAGNOSIS — K2981 Duodenitis with bleeding: Secondary | ICD-10-CM

## 2012-04-13 DIAGNOSIS — I5032 Chronic diastolic (congestive) heart failure: Secondary | ICD-10-CM

## 2012-04-13 DIAGNOSIS — Z79899 Other long term (current) drug therapy: Secondary | ICD-10-CM | POA: Insufficient documentation

## 2012-04-13 DIAGNOSIS — I8500 Esophageal varices without bleeding: Secondary | ICD-10-CM

## 2012-04-13 DIAGNOSIS — K639 Disease of intestine, unspecified: Secondary | ICD-10-CM | POA: Insufficient documentation

## 2012-04-13 DIAGNOSIS — I10 Essential (primary) hypertension: Secondary | ICD-10-CM | POA: Insufficient documentation

## 2012-04-13 DIAGNOSIS — E119 Type 2 diabetes mellitus without complications: Secondary | ICD-10-CM | POA: Insufficient documentation

## 2012-04-13 DIAGNOSIS — E785 Hyperlipidemia, unspecified: Secondary | ICD-10-CM | POA: Insufficient documentation

## 2012-04-13 HISTORY — PX: COLONOSCOPY: SHX5424

## 2012-04-13 HISTORY — PX: ESOPHAGOGASTRODUODENOSCOPY: SHX5428

## 2012-04-13 SURGERY — EGD (ESOPHAGOGASTRODUODENOSCOPY)
Anesthesia: Moderate Sedation

## 2012-04-13 MED ORDER — ACETAMINOPHEN-CODEINE #3 300-30 MG PO TABS
2.0000 | ORAL_TABLET | Freq: Once | ORAL | Status: AC
  Administered 2012-04-13: 2 via ORAL

## 2012-04-13 MED ORDER — NADOLOL 20 MG PO TABS: ORAL_TABLET | ORAL | Status: DC

## 2012-04-13 MED ORDER — BUTAMBEN-TETRACAINE-BENZOCAINE 2-2-14 % EX AERO
INHALATION_SPRAY | CUTANEOUS | Status: DC | PRN
  Administered 2012-04-13: 2 via TOPICAL

## 2012-04-13 MED ORDER — MIDAZOLAM HCL 10 MG/2ML IJ SOLN
INTRAMUSCULAR | Status: DC | PRN
  Administered 2012-04-13 (×3): 2 mg via INTRAVENOUS
  Administered 2012-04-13: 1 mg via INTRAVENOUS

## 2012-04-13 MED ORDER — FENTANYL CITRATE 0.05 MG/ML IJ SOLN
INTRAMUSCULAR | Status: DC | PRN
  Administered 2012-04-13 (×3): 25 ug via INTRAVENOUS

## 2012-04-13 MED ORDER — DIPHENHYDRAMINE HCL 50 MG/ML IJ SOLN
INTRAMUSCULAR | Status: AC
  Filled 2012-04-13: qty 1

## 2012-04-13 MED ORDER — SODIUM CHLORIDE 0.9 % IV SOLN: INTRAVENOUS | Status: DC

## 2012-04-13 MED ORDER — ACETAMINOPHEN-CODEINE #3 300-30 MG PO TABS
ORAL_TABLET | ORAL | Status: AC
  Filled 2012-04-13: qty 2

## 2012-04-13 MED ORDER — FENTANYL CITRATE 0.05 MG/ML IJ SOLN
INTRAMUSCULAR | Status: AC
  Filled 2012-04-13: qty 2

## 2012-04-13 MED ORDER — MIDAZOLAM HCL 10 MG/2ML IJ SOLN
INTRAMUSCULAR | Status: AC
  Filled 2012-04-13: qty 2

## 2012-04-13 MED ORDER — DIPHENHYDRAMINE HCL 50 MG/ML IJ SOLN
INTRAMUSCULAR | Status: DC | PRN
  Administered 2012-04-13: 25 mg via INTRAVENOUS

## 2012-04-13 NOTE — Op Note (Signed)
[facility] [place], [address]   ENDOSCOPY PROCEDURE REPORT  PATIENT: Adam Butler, Adam Butler  MR#: [id number] BIRTHDATE: Sep 07, 1955 , 11  yrs. old GENDER: Male ENDOSCOPIST: Inda Castle, MD REFERRED BY:  Antonietta Jewel, M.D. PROCEDURE DATE:  04/13/2012 PROCEDURE:  EGD w/ biopsy and EGD w/ band ligation of varices ASA CLASS:     Class III INDICATIONS:  surveillance of esophageal varices ; Hemoccult-positive stool. MEDICATIONS: There was residual sedation effect present from prior procedure, Versed 64m IV, and Fentanyl 25 mcg IV TOPICAL ANESTHETIC: Lidocaine Spray  DESCRIPTION OF PROCEDURE: After the risks benefits and alternatives of the procedure were thoroughly explained, informed consent was obtained.  The    endoscope was introduced through the mouth and advanced to the third portion of the duodenum. Without limitations. The instrument was slowly withdrawn as the mucosa was fully examined.    Grade 4 varices , some with red wheal signs, were present throughout the entire length of the esophagus.  7 bands were placed in the distal and middle esophagus.   Grade 4 varices , some with red wheal signs, were present throughout the entire length of the esophagus.  7 bands were placed in the distal and middle esophagus. There were multiple superficial ulcers and erosions in the second portion of the duodenum.  Biopsies were taken.   There were multiple superficial ulcers and erosions in the second portion of the duodenum.  Biopsies were taken.   Changes consistent with portal hypertensive gastropathy were seen throughout the stomach. Changes consistent with portal hypertensive gastropathy were seen throughout the stomach.  Retroflexed views revealed no abnormalities.     The scope was then withdrawn from the patient and the procedure completed.  COMPLICATIONS: There were no complications. ENDOSCOPIC IMPRESSION: 1.   Grade 4 varices -  status post  band ligation 2.   duodenal ulcers and erosions 3.  portal hypertensive gastropathy  RECOMMENDATIONS: increase nadolol to 40 mg daily followup endoscopy in 2-3 weeks  REPEAT EXAM:  eSigned:  RInda Castle MD 04/13/2012 9:13 AM   CC:

## 2012-04-13 NOTE — Op Note (Signed)
[facility] [place], [address]   COLONOSCOPY PROCEDURE REPORT  PATIENT: Adam Butler, Adam Butler  MR#: [id number] BIRTHDATE: 1956/06/13 , 35  yrs. old GENDER: Male ENDOSCOPIST: Inda Castle, MD REFERRED [id number] Sheryle Hail, M.D. PROCEDURE DATE:  04/13/2012 PROCEDURE:   Colonoscopy, diagnostic ASA CLASS:   Class III INDICATIONS:heme-positive stool. MEDICATIONS: These medications were titrated to patient response per physician's verbal order, Versed 7 mg IV, Fentanyl 75 mcg IV, and Benadryl 25 mg IV  DESCRIPTION OF PROCEDURE:   After the risks benefits and alternatives of the procedure were thoroughly explained, informed consent was obtained.  A digital rectal exam revealed no abnormalities of the rectum.   The     endoscope was introduced through the anus and advanced to the cecum, which was identified by the ileocecal valve. No adverse events experienced.   The quality of the prep was Suprep good  The instrument was then slowly withdrawn as the colon was fully examined.      COLON FINDINGS: There were several areas of deeply injected hemorrhagic appearing submucosa in the right colon and cecum. There is no spontaneous bleeding.  These areas measured up to 1 cm in diameter.  Also noted was friable mucosa with mild suctioning. There were several areas of deeply injected hemorrhagic appearing submucosa in the right colon and cecum.  There is no spontaneous bleeding.  These areas measured up to 1 cm in diameter.  Also noted was friable mucosa with mild suctioning.   Odetta Pink prominent panic appearing vessels in the rectal vault consistent with rectal varices.   Odetta Pink prominent panic appearing vessels in the rectal vault consistent with rectal varices.   The colon mucosa was otherwise normal.  Retroflexed views revealed no abnormalities. The time to cecum=8 minutes 25 seconds.  Withdrawal time=8 minutes 27 seconds.  The scope was withdrawn and the  procedure completed. COMPLICATIONS: There were no complications.  ENDOSCOPIC IMPRESSION: 1.    portal hypertensive colopathy 2.   rectal varices 3.   friable colonic mucosa  Findings may explain Hemoccult-positive stool.  RECOMMENDATIONS: continue nadolol   eSigned:  Inda Castle, MD 04/13/2012 9:04 AM   cc:

## 2012-04-13 NOTE — Interval H&P Note (Signed)
History and Physical Interval Note:  04/13/2012 8:04 AM  Adam Butler  has presented today for surgery, with the diagnosis of blood in stool  The various methods of treatment have been discussed with the patient and family. After consideration of risks, benefits and other options for treatment, the patient has consented to  Procedure(s) (LRB) with comments: ESOPHAGOGASTRODUODENOSCOPY (EGD) (N/A) COLONOSCOPY (N/A) as a surgical intervention .  The patient's history has been reviewed, patient examined, no change in status, stable for surgery.  I have reviewed the patient's chart and labs.  Questions were answered to the patient's satisfaction.     The recent H&P (dated *04/10/12**) was reviewed, the patient was examined and there is no change in the patients condition since that H&P was completed.   Erskine Emery  04/13/2012, 8:04 AM   Erskine Emery

## 2012-04-13 NOTE — H&P (View-Only) (Signed)
History of Present Illness: 56 year old white male with history of cirrhosis, nonbleeding esophageal varices, diabetes, coronary artery disease, referred for hemoccult-positive stools. This was noted on routine testing. There is no history of melena, hematochezia, change of bowel habits or abdominal pain.    Past Medical History  Diagnosis Date  . Esophageal varices   . Cirrhosis     Cryptogenic, ? NASH. Family and patient deny EtOH. HCV, HBV, and HAV workup negative. ANA negative. AMA positive. Grade II esophageal varices on EGD with no evidence for bleedig in 12/11. He had hepatic encephalopaty and ascites in 12/11  . Hyperlipemia   . Chronic heart failure   . Coronary artery disease     Inferior MI 12/11; LHC with occluded mid CFX and 80% proximal RCA. EF 55%. He had 3.0 x 28 vision BMS to CFX  . Diastolic CHF, acute     Echo 12/11 with ef 50-55% and mild LVH. EF 55% by LV0gram in 12/11  . Hypertension   . Type II diabetes mellitus   . SVT (supraventricular tachycardia)     1/12: appeared to be an ectopic atrial tachycardia. Required DCCV with hemodynamic instability  . GI bleed     12/11: Etiology not clearly defined. EGD showed nonbleeding esophageal varices.   Past Surgical History  Procedure Date  . Cardiac catheterization 07/01/2010    with stent placement  . Appendectomy    family history includes COPD in his mother; Diabetes in his brother and father; Heart attack in his father; Heart attack (age of onset:36) in his brother; and Heart attack (age of onset:50) in his brother. Current Outpatient Prescriptions  Medication Sig Dispense Refill  . aspirin 81 MG tablet Take 81 mg by mouth daily.        . furosemide (LASIX) 40 MG tablet Take 1 tablet (40 mg total) by mouth daily.  30 tablet  6  . glipiZIDE (GLUCOTROL XL) 10 MG 24 hr tablet Take 10 mg by mouth 2 (two) times daily.        Marland Kitchen lisinopril (PRINIVIL,ZESTRIL) 5 MG tablet Take 1 tablet (5 mg total) by mouth daily.  30  tablet  6  . metFORMIN (GLUCOPHAGE) 500 MG tablet Take 500 mg by mouth 2 (two) times daily.       . Multiple Vitamin (MULTIVITAMIN) capsule Take 1 capsule by mouth daily.        . nadolol (CORGARD) 20 MG tablet Take 1 tablet (20 mg total) by mouth daily. Take 1/2 tablet daily.  30 tablet  6  . nitroGLYCERIN (NITROSTAT) 0.4 MG SL tablet Place 1 tablet (0.4 mg total) under the tongue every 5 (five) minutes as needed.  30 tablet  6  . pravastatin (PRAVACHOL) 80 MG tablet Take 1 tablet (80 mg total) by mouth daily.  30 tablet  6   Allergies as of 04/10/2012  . (No Known Allergies)    reports that he has been smoking Cigarettes.  He has a 36 pack-year smoking history. He has never used smokeless tobacco. He reports that he does not drink alcohol or use illicit drugs.     Review of Systems: He complains of chronic fatigue Pertinent positive and negative review of systems were noted in the above HPI section. All other review of systems were otherwise negative.  Vital signs were reviewed in today's medical record Physical Exam: General: He is an obese male no acute distress Head: Normocephalic and atraumatic Eyes:  sclerae anicteric, EOMI Ears: Normal auditory acuity Mouth:  No deformity or lesions Neck: Supple, no masses or thyromegaly Lungs: Clear throughout to auscultation Heart: Regular rate and rhythm; no murmurs, rubs or bruits Abdomen: Soft, non tender and non distended. No masses, hepatosplenomegaly or hernias noted. Normal Bowel sounds Rectal:deferred Musculoskeletal: Symmetrical with no gross deformities  Skin: No lesions on visible extremities Pulses:  Normal pulses noted Extremities: No clubbing, cyanosis,  or deformities noted. There is trace ankle edema Neurological: Alert oriented x 4, grossly nonfocal Cervical Nodes:  No significant cervical adenopathy Inguinal Nodes: No significant inguinal adenopathy Psychological:  Alert and cooperative. Normal mood and  affect

## 2012-04-14 ENCOUNTER — Telehealth: Payer: Self-pay | Admitting: Internal Medicine

## 2012-04-14 NOTE — Telephone Encounter (Signed)
The patient is having some mild chest pain that occurs with eating and after and also worse when upright. Not severe and not like angina.  I advised this is seen after variceal ligation in esophagus and expect it to continue to improve - ok to use acetaminophen up to 2000 mg total/day.

## 2012-04-16 ENCOUNTER — Encounter (HOSPITAL_COMMUNITY): Payer: Self-pay

## 2012-04-16 ENCOUNTER — Encounter (HOSPITAL_COMMUNITY): Payer: Self-pay | Admitting: Gastroenterology

## 2012-04-17 ENCOUNTER — Other Ambulatory Visit: Payer: Self-pay | Admitting: Gastroenterology

## 2012-04-17 ENCOUNTER — Ambulatory Visit (INDEPENDENT_AMBULATORY_CARE_PROVIDER_SITE_OTHER): Payer: Self-pay | Admitting: Gastroenterology

## 2012-04-17 DIAGNOSIS — I8500 Esophageal varices without bleeding: Secondary | ICD-10-CM

## 2012-04-17 DIAGNOSIS — Z23 Encounter for immunization: Secondary | ICD-10-CM

## 2012-05-01 ENCOUNTER — Telehealth: Payer: Self-pay | Admitting: Gastroenterology

## 2012-05-01 NOTE — Telephone Encounter (Signed)
Pt states he wants to cancel his procedure at the hospital, pt states he cannot afford it. Appt cancelled with Sharee Pimple in Endo.

## 2012-05-04 ENCOUNTER — Ambulatory Visit (HOSPITAL_COMMUNITY): Admission: RE | Admit: 2012-05-04 | Payer: Self-pay | Source: Ambulatory Visit | Admitting: Gastroenterology

## 2012-05-04 ENCOUNTER — Encounter (HOSPITAL_COMMUNITY): Admission: RE | Payer: Self-pay | Source: Ambulatory Visit

## 2012-05-04 SURGERY — EGD (ESOPHAGOGASTRODUODENOSCOPY)
Anesthesia: Moderate Sedation

## 2012-05-08 ENCOUNTER — Ambulatory Visit (INDEPENDENT_AMBULATORY_CARE_PROVIDER_SITE_OTHER): Payer: Self-pay | Admitting: Gastroenterology

## 2012-05-08 DIAGNOSIS — Z23 Encounter for immunization: Secondary | ICD-10-CM

## 2012-05-08 DIAGNOSIS — K7460 Unspecified cirrhosis of liver: Secondary | ICD-10-CM

## 2013-04-19 ENCOUNTER — Other Ambulatory Visit (HOSPITAL_COMMUNITY): Payer: Self-pay | Admitting: Internal Medicine

## 2013-04-19 ENCOUNTER — Ambulatory Visit (HOSPITAL_COMMUNITY)
Admission: RE | Admit: 2013-04-19 | Discharge: 2013-04-19 | Disposition: A | Discharge status: Home / Self Care | Payer: Medicare Other | Source: Ambulatory Visit | Attending: Internal Medicine | Admitting: Internal Medicine

## 2013-04-19 DIAGNOSIS — R52 Pain, unspecified: Secondary | ICD-10-CM

## 2013-04-19 DIAGNOSIS — R9431 Abnormal electrocardiogram [ECG] [EKG]: Secondary | ICD-10-CM | POA: Insufficient documentation

## 2013-04-19 DIAGNOSIS — R0602 Shortness of breath: Secondary | ICD-10-CM | POA: Insufficient documentation

## 2013-04-19 DIAGNOSIS — R079 Chest pain, unspecified: Secondary | ICD-10-CM | POA: Insufficient documentation

## 2013-05-24 ENCOUNTER — Ambulatory Visit (INDEPENDENT_AMBULATORY_CARE_PROVIDER_SITE_OTHER): Payer: Medicare Other | Admitting: Gastroenterology

## 2013-05-24 DIAGNOSIS — K7460 Unspecified cirrhosis of liver: Secondary | ICD-10-CM

## 2013-05-24 DIAGNOSIS — Z23 Encounter for immunization: Secondary | ICD-10-CM

## 2013-05-27 ENCOUNTER — Ambulatory Visit: Payer: Medicare Other | Admitting: Cardiology

## 2013-12-28 ENCOUNTER — Encounter (HOSPITAL_COMMUNITY): Payer: Self-pay | Admitting: Emergency Medicine

## 2013-12-28 ENCOUNTER — Inpatient Hospital Stay (HOSPITAL_COMMUNITY)
Admission: EM | Admit: 2013-12-28 | Discharge: 2014-01-01 | DRG: 871 | Disposition: A | Discharge status: Home / Self Care | Payer: Medicare Other | Attending: Internal Medicine | Admitting: Internal Medicine

## 2013-12-28 ENCOUNTER — Emergency Department (HOSPITAL_COMMUNITY): Payer: Medicare Other

## 2013-12-28 DIAGNOSIS — IMO0002 Reserved for concepts with insufficient information to code with codable children: Secondary | ICD-10-CM | POA: Diagnosis present

## 2013-12-28 DIAGNOSIS — K2981 Duodenitis with bleeding: Secondary | ICD-10-CM

## 2013-12-28 DIAGNOSIS — R195 Other fecal abnormalities: Secondary | ICD-10-CM

## 2013-12-28 DIAGNOSIS — I8510 Secondary esophageal varices without bleeding: Secondary | ICD-10-CM | POA: Diagnosis present

## 2013-12-28 DIAGNOSIS — I509 Heart failure, unspecified: Secondary | ICD-10-CM | POA: Diagnosis present

## 2013-12-28 DIAGNOSIS — Z66 Do not resuscitate: Secondary | ICD-10-CM | POA: Diagnosis present

## 2013-12-28 DIAGNOSIS — Z6841 Body Mass Index (BMI) 40.0 and over, adult: Secondary | ICD-10-CM

## 2013-12-28 DIAGNOSIS — IMO0001 Reserved for inherently not codable concepts without codable children: Secondary | ICD-10-CM

## 2013-12-28 DIAGNOSIS — I5032 Chronic diastolic (congestive) heart failure: Secondary | ICD-10-CM

## 2013-12-28 DIAGNOSIS — I10 Essential (primary) hypertension: Secondary | ICD-10-CM | POA: Diagnosis present

## 2013-12-28 DIAGNOSIS — E119 Type 2 diabetes mellitus without complications: Secondary | ICD-10-CM | POA: Diagnosis present

## 2013-12-28 DIAGNOSIS — E1165 Type 2 diabetes mellitus with hyperglycemia: Secondary | ICD-10-CM | POA: Diagnosis present

## 2013-12-28 DIAGNOSIS — Z794 Long term (current) use of insulin: Secondary | ICD-10-CM

## 2013-12-28 DIAGNOSIS — K7460 Unspecified cirrhosis of liver: Secondary | ICD-10-CM

## 2013-12-28 DIAGNOSIS — E669 Obesity, unspecified: Secondary | ICD-10-CM

## 2013-12-28 DIAGNOSIS — Z7982 Long term (current) use of aspirin: Secondary | ICD-10-CM

## 2013-12-28 DIAGNOSIS — D6959 Other secondary thrombocytopenia: Secondary | ICD-10-CM | POA: Diagnosis present

## 2013-12-28 DIAGNOSIS — F17200 Nicotine dependence, unspecified, uncomplicated: Secondary | ICD-10-CM | POA: Diagnosis present

## 2013-12-28 DIAGNOSIS — Z9861 Coronary angioplasty status: Secondary | ICD-10-CM

## 2013-12-28 DIAGNOSIS — K269 Duodenal ulcer, unspecified as acute or chronic, without hemorrhage or perforation: Secondary | ICD-10-CM

## 2013-12-28 DIAGNOSIS — A419 Sepsis, unspecified organism: Secondary | ICD-10-CM

## 2013-12-28 DIAGNOSIS — Z79899 Other long term (current) drug therapy: Secondary | ICD-10-CM

## 2013-12-28 DIAGNOSIS — K766 Portal hypertension: Secondary | ICD-10-CM | POA: Diagnosis present

## 2013-12-28 DIAGNOSIS — K7689 Other specified diseases of liver: Secondary | ICD-10-CM | POA: Diagnosis present

## 2013-12-28 DIAGNOSIS — A09 Infectious gastroenteritis and colitis, unspecified: Secondary | ICD-10-CM | POA: Diagnosis present

## 2013-12-28 DIAGNOSIS — I2510 Atherosclerotic heart disease of native coronary artery without angina pectoris: Secondary | ICD-10-CM | POA: Diagnosis present

## 2013-12-28 DIAGNOSIS — I8500 Esophageal varices without bleeding: Secondary | ICD-10-CM

## 2013-12-28 DIAGNOSIS — E785 Hyperlipidemia, unspecified: Secondary | ICD-10-CM

## 2013-12-28 DIAGNOSIS — K7290 Hepatic failure, unspecified without coma: Secondary | ICD-10-CM | POA: Diagnosis present

## 2013-12-28 DIAGNOSIS — I252 Old myocardial infarction: Secondary | ICD-10-CM

## 2013-12-28 DIAGNOSIS — R6520 Severe sepsis without septic shock: Secondary | ICD-10-CM

## 2013-12-28 DIAGNOSIS — K529 Noninfective gastroenteritis and colitis, unspecified: Secondary | ICD-10-CM

## 2013-12-28 DIAGNOSIS — K7682 Hepatic encephalopathy: Secondary | ICD-10-CM

## 2013-12-28 HISTORY — DX: Hepatic failure, unspecified without coma: K72.90

## 2013-12-28 HISTORY — DX: Presence of coronary angioplasty implant and graft: Z95.5

## 2013-12-28 HISTORY — DX: Hepatic encephalopathy: K76.82

## 2013-12-28 LAB — BASIC METABOLIC PANEL
BUN: 13 mg/dL (ref 6–23)
CALCIUM: 9.2 mg/dL (ref 8.4–10.5)
CO2: 20 meq/L (ref 19–32)
CREATININE: 0.72 mg/dL (ref 0.50–1.35)
Chloride: 100 mEq/L (ref 96–112)
GFR calc Af Amer: >90 mL/min (ref >90)
Glucose, Bld: 174 mg/dL — ABNORMAL HIGH (ref 70–99)
Potassium: 4 mEq/L (ref 3.7–5.3)
SODIUM: 137 meq/L (ref 137–147)

## 2013-12-28 LAB — CBC
HCT: 39.8 % (ref 39.0–52.0)
Hemoglobin: 14.1 g/dL (ref 13.0–17.0)
MCH: 34.5 pg — AB (ref 26.0–34.0)
MCHC: 35.4 g/dL (ref 30.0–36.0)
MCV: 97.3 fL (ref 78.0–100.0)
Platelets: 69 10*3/uL — ABNORMAL LOW (ref 150–400)
RBC: 4.09 MIL/uL — AB (ref 4.22–5.81)
RDW: 13.4 % (ref 11.5–15.5)
WBC: 9.7 10*3/uL (ref 4.0–10.5)

## 2013-12-28 LAB — I-STAT CG4 LACTIC ACID, ED: Lactic Acid, Venous: 3.27 mmol/L — ABNORMAL HIGH (ref 0.5–2.2)

## 2013-12-28 MED ORDER — SODIUM CHLORIDE 0.9 % IV BOLUS (SEPSIS)
1000.0000 mL | Freq: Once | INTRAVENOUS | Status: AC
  Administered 2013-12-29: 1000 mL via INTRAVENOUS

## 2013-12-28 MED ORDER — PIPERACILLIN-TAZOBACTAM 3.375 G IVPB 30 MIN
3.3750 g | Freq: Once | INTRAVENOUS | Status: AC
  Administered 2013-12-29: 3.375 g via INTRAVENOUS
  Filled 2013-12-28: qty 50

## 2013-12-28 NOTE — ED Provider Notes (Signed)
CSN: [id number]     Arrival date & time 12/28/13  2211 History   First MD Initiated Contact with Patient 12/28/13 2256     Chief Complaint  Patient presents with  . Altered Mental Status     (Consider location/radiation/quality/duration/timing/severity/associated sxs/prior Treatment) HPI Comments: 58 year old male with history of obesity, smoking, cirrhosis without alcohol, diastolic heart failure, CAD, lipids presents with altered mental status. Wife provides majority of history discusses that patient was okay this morning in the afternoon started complaining of nonspecific backache and abdominal discomfort. Patient was tired went for a nap as he usually does and then wife checked on him later and he was standing with minimal verbal response and generally weak. No history of similar. Patient cannot give details except he just feels generally unwell. He'll not describe abdominal discomfort of the back pain. Fever just noticed this evening. No current antibiotics. Patient has been on lactulose in the past is currently not on. Patient denies significant cough or urinary symptoms. No neck stiffness or sick contacts.  Patient is a 58 y.o. male presenting with altered mental status. The history is provided by the patient and the spouse.  Altered Mental Status Associated symptoms: abdominal pain, fever, headaches (mild gradual onset frontal) and weakness (Gen.)   Associated symptoms: no light-headedness, no rash and no vomiting     Past Medical History  Diagnosis Date  . Esophageal varices   . Cirrhosis     Cryptogenic, ? NASH. Family and patient deny EtOH. HCV, HBV, and HAV workup negative. ANA negative. AMA positive. Grade II esophageal varices on EGD with no evidence for bleedig in 12/11. He had hepatic encephalopaty and ascites in 12/11  . Hyperlipemia   . Chronic heart failure   . Coronary artery disease     Inferior MI 12/11; LHC with occluded mid CFX and 80% proximal RCA. EF 55%. He had  3.0 x 28 vision BMS to CFX  . Diastolic CHF, acute     Echo 12/11 with ef 50-55% and mild LVH. EF 55% by LV0gram in 12/11  . Hypertension   . Type II diabetes mellitus   . SVT (supraventricular tachycardia)     1/12: appeared to be an ectopic atrial tachycardia. Required DCCV with hemodynamic instability  . GI bleed     12/11: Etiology not clearly defined. EGD showed nonbleeding esophageal varices.  . Shortness of breath   . S/P coronary artery stent placement    Past Surgical History  Procedure Laterality Date  . Cardiac catheterization  07/01/2010    with stent placement  . Appendectomy    . Orif r leg    . Esophagogastroduodenoscopy  04/13/2012    Procedure: ESOPHAGOGASTRODUODENOSCOPY (EGD);  Surgeon: Inda Castle, MD;  Location: [facility];  Service: Endoscopy;  Laterality: N/A;  . Colonoscopy  04/13/2012    Procedure: COLONOSCOPY;  Surgeon: Inda Castle, MD;  Location: [facility];  Service: Endoscopy;  Laterality: N/A;   Family History  Problem Relation Age of Onset  . Heart attack Brother 39    MI  . Heart attack Brother 14    MI  . Heart attack Father   . Diabetes Father   . Diabetes Brother   . COPD Mother    History  Substance Use Topics  . Smoking status: Current Some Day Smoker -- 1.00 packs/day for 36 years    Types: Cigarettes  . Smokeless tobacco: Never Used  . Alcohol Use: No    Review of Systems  Constitutional: Positive for fever and appetite change. Negative for chills.  HENT: Negative for congestion.   Eyes: Negative for visual disturbance.  Respiratory: Negative for shortness of breath.   Cardiovascular: Negative for chest pain.  Gastrointestinal: Positive for abdominal pain. Negative for vomiting.  Genitourinary: Negative for dysuria and flank pain.  Musculoskeletal: Positive for back pain. Negative for neck pain and neck stiffness.  Skin: Negative for rash.  Neurological: Positive for weakness (Gen.) and headaches (mild gradual onset  frontal). Negative for light-headedness.      Allergies  Sulfa antibiotics  Home Medications   Prior to Admission medications   Medication Sig Start Date End Date Taking? Authorizing Perel Hauschild  aspirin 81 MG tablet Take 81 mg by mouth daily.    Yes Historical Mirren Gest, MD  furosemide (LASIX) 40 MG tablet Take 1 tablet (40 mg total) by mouth daily. 01/09/12  Yes Larey Dresser, MD  glipiZIDE (GLUCOTROL XL) 10 MG 24 hr tablet Take 10 mg by mouth 2 (two) times daily.     Yes Historical Jaleen Grupp, MD  insulin regular 1 Units/mL in sodium chloride 0.9 % 100 mL Inject into the vein 2 (two) times daily.   Yes Historical Allan Bacigalupi, MD  lisinopril (PRINIVIL,ZESTRIL) 2.5 MG tablet Take 2.5 mg by mouth daily.   Yes Historical Shavonn Convey, MD  metFORMIN (GLUCOPHAGE) 500 MG tablet Take 500 mg by mouth 2 (two) times daily.    Yes Historical Kowen Kluth, MD  Multiple Vitamin (MULTIVITAMIN) capsule Take 1 capsule by mouth daily.     Yes Historical Nickisha Hum, MD  nadolol (CORGARD) 20 MG tablet Take 10 mg by mouth daily.   Yes Historical Khari Lett, MD  nitroGLYCERIN (NITROSTAT) 0.4 MG SL tablet Place 1 tablet (0.4 mg total) under the tongue every 5 (five) minutes as needed. 01/09/12  Yes Larey Dresser, MD  pravastatin (PRAVACHOL) 80 MG tablet Take 1 tablet (80 mg total) by mouth daily. 01/13/11  Yes Larey Dresser, MD   BP 133/60  Pulse 96  Temp(Src) 100.3 F (37.9 C) (Oral)  Resp 28  Ht 5' 3"  (1.6 m)  Wt 280 lb (127.007 kg)  BMI 49.61 kg/m2  SpO2 95% Physical Exam  Nursing note and vitals reviewed. Constitutional: He is oriented to person, place, and time. He appears well-developed and well-nourished.  HENT:  Head: Normocephalic and atraumatic.  Dry mucous membranes  Eyes: Conjunctivae are normal. Right eye exhibits no discharge. Left eye exhibits no discharge.  Neck: Normal range of motion. Neck supple. No tracheal deviation present.  Cardiovascular: Regular rhythm.  Tachycardia present.    Pulmonary/Chest: Effort normal. He has rales.  Abdominal: Soft. He exhibits no distension. There is tenderness (obese nonfocal upper especially right upper quadrant discomfort mild). There is no guarding.  Musculoskeletal: He exhibits no edema.  Neurological: He is alert and oriented to person, place, and time.  With loud verbal patient alert and oriented. General mild lethargy/fatigue appearance. Finger nose intact bilateral, pupils equal bilateral, eczema the muscle function intact, no obvious facial droop or drift. Patient is mild general weakness.  Skin: Skin is warm. No rash noted.    ED Course  Procedures (including critical care time) Labs Review Labs Reviewed  CBC - Abnormal; Notable for the following:    RBC 4.09 (*)    MCH 34.5 (*)    Platelets 69 (*)    All other components within normal limits  BASIC METABOLIC PANEL - Abnormal; Notable for the following:    Glucose, Bld 174 (*)  All other components within normal limits  URINALYSIS, ROUTINE W REFLEX MICROSCOPIC - Abnormal; Notable for the following:    Color, Urine AMBER (*)    Bilirubin Urine SMALL (*)    Ketones, ur 15 (*)    All other components within normal limits  AMMONIA - Abnormal; Notable for the following:    Ammonia 139 (*)    All other components within normal limits  HEPATIC FUNCTION PANEL - Abnormal; Notable for the following:    Albumin 2.9 (*)    AST 69 (*)    Alkaline Phosphatase 142 (*)    Total Bilirubin 1.4 (*)    Bilirubin, Direct 0.4 (*)    Indirect Bilirubin 1.0 (*)    All other components within normal limits  LACTIC ACID, PLASMA - Abnormal; Notable for the following:    Lactic Acid, Venous 3.2 (*)    All other components within normal limits  I-STAT CG4 LACTIC ACID, ED - Abnormal; Notable for the following:    Lactic Acid, Venous 3.27 (*)    All other components within normal limits  CULTURE, BLOOD (ROUTINE X 2)  CULTURE, BLOOD (ROUTINE X 2)  CBC WITH DIFFERENTIAL    Imaging  Review Dg Chest 2 View  12/28/2013   CLINICAL DATA:  Altered mental status. Shortness of breath and back pain.  EXAM: CHEST  2 VIEW  COMPARISON:  Chest radiograph performed 04/19/2013  FINDINGS: The lungs are hypoexpanded. Vascular crowding is noted. Minimal bilateral atelectasis is seen. There is no evidence of pleural effusion or pneumothorax.  The heart is borderline enlarged. No acute osseous abnormalities are seen.  IMPRESSION: Lungs hypoexpanded and difficult to fully assess. Minimal bilateral atelectasis noted. Borderline cardiomegaly.   Electronically Signed   By: Garald Balding M.D.   On: 12/28/2013 23:28   Ct Head Wo Contrast  12/29/2013   CLINICAL DATA:  Altered level of consciousness.  Frontal headache.  EXAM: CT HEAD WITHOUT CONTRAST  TECHNIQUE: Contiguous axial images were obtained from the base of the skull through the vertex without intravenous contrast.  COMPARISON:  CT of the head performed 07/03/2010  FINDINGS: There is no evidence of acute infarction, mass lesion, or intra- or extra-axial hemorrhage on CT.  Scattered subcortical white matter change likely reflects small vessel ischemic microangiopathy. A small chronic infarct is noted at the right occipital lobe.  The posterior fossa, including the cerebellum, brainstem and fourth ventricle, is within normal limits. The third and lateral ventricles, and basal ganglia are unremarkable in appearance. No mass effect or midline shift is seen.  There is no evidence of fracture; visualized osseous structures are unremarkable in appearance. The orbits are within normal limits. Mild mucosal thickening is noted at the right side of the sphenoid sinus. The remaining paranasal sinuses and mastoid air cells are well-aerated. No significant soft tissue abnormalities are seen. Apparent focal soft tissue thickening along the right posterior side of the neck is thought to reflect volume averaging.  IMPRESSION: 1. No acute intracranial pathology seen on CT.  2. Scattered small vessel ischemic microangiopathy. 3. Small chronic infarct at the right occipital lobe. 4. Mild mucosal thickening at the right side of the sphenoid sinus.   Electronically Signed   By: Garald Balding M.D.   On: 12/29/2013 01:36   Ct Abdomen Pelvis W Contrast  12/29/2013   CLINICAL DATA:  Frontal headache, left lower quadrant abdominal pain  EXAM: CT ABDOMEN AND PELVIS WITH CONTRAST  TECHNIQUE: Multidetector CT imaging of the abdomen and pelvis  was performed using the standard protocol following bolus administration of intravenous contrast.  COMPARISON:  none  CONTRAST:  140m OMNIPAQUE IOHEXOL 300 MG/ML  SOLN  FINDINGS: Lung bases are clear.  No pericardial fluid.  The liver has a lobular contour consistent with cirrhosis. There are gallstones layering dependently in the gallbladder. No evidence of pancreatic inflammation. The spleen is enlarged. This extensive venous collaterals extending along the splenic hilum and along the esophagus. The left portal vein is patent. Right portal vein is diminutive. There is haziness the central mesentery.  Adrenal glands and kidneys are normal.  The stomach, small bowel cecum are normal.  There is thickening of the ascending colon with pericolonic inflammation. This extends into the transverse colon. The rectosigmoid colon appears normal.  Abdominal or is normal caliber. intimal calcification of the aorta.  There is a partially occlusive thrombus within the superior mesenteric vein. There partially occlusive thrombus within the splenic vein which is partially occlusive. There is recanalization of the umbilical vein.  No free fluid the pelvis. The prostate gland and bladder normal. No pelvic lymphadenopathy. No aggressive osseous lesion.  IMPRESSION: 1. Haziness within the central mesentery which could relate to portal hypertension. 2. There is bowel wall thickening involving the ascending colon and mid transverse colon. This segmental bowel wall  thickening/edema could represent ischemic change. Secondary differentials would include inflammatory bowel disease and infectious colitis or drug-induced colitis. The edema could represent passive congestion from the partial venous occlusion. 3. Cirrhosis and portal hypertension with multiple venous collaterals. 4. Partial thrombosis of the splenic vein and superior mesenteric vein.   Electronically Signed   By: SSuzy BouchardM.D.   On: 12/29/2013 01:45     EKG Interpretation None      MDM   Final diagnoses:  Encephalopathy, hepatic  Cirrhosis of liver without mention of alcohol  Colitis  Sepsis, due to unspecified organism   Patient presents with confusion, fever with cirrhosis history. Clinical differential concerning for infectious etiology likely from abdominal source either urine infection/colitis/biliary/SBP. Fluid bolus, antipyretics given. On recheck no acute change. Ammonia level significantly elevated and patient has not been taking lactulose recently. Lactose ordered in ER. On recheck patient improved significantly and more alert. CT scan showed inflammation of the colon with partial SMV clot, results reviewed.  Patient received Zosyn for sepsis workup on arrival. I have updated the patient and family with plan for admission. Discussed with [facility] for admission who evaluated the patient in ER.  The patients results and plan were reviewed and discussed.   Any x-rays performed were personally reviewed by myself.   Differential diagnosis were considered with the presenting HPI.  Medications  acetaminophen (TYLENOL) tablet 650 mg (650 mg Oral Not Given 12/29/13 0329)  0.9 %  sodium chloride infusion (not administered)  sodium chloride 0.9 % bolus 1,000 mL (0 mLs Intravenous Stopped 12/29/13 0130)  piperacillin-tazobactam (ZOSYN) IVPB 3.375 g (0 g Intravenous Stopped 12/29/13 0115)  iohexol (OMNIPAQUE) 300 MG/ML solution 100 mL (100 mLs Intravenous Contrast Given  12/29/13 0051)  lactulose (CHRONULAC) 10 GM/15ML solution 20 g (20 g Oral Given 12/29/13 0126)     Filed Vitals:   12/29/13 0200 12/29/13 0245 12/29/13 0300 12/29/13 0315  BP: 111/57 107/60 99/65   Pulse: 96 91 88   Temp:    98.3 F (36.8 C)  TempSrc:    Oral  Resp: 26 29 24    Height:      Weight:      SpO2: 94%  95% 95%     Admission/ observation were discussed with the admitting physician, patient and/or family and they are comfortable with the plan.      Mariea Clonts, MD 12/29/13 [phone]

## 2013-12-28 NOTE — ED Notes (Signed)
Patient at x-ray. Dr. Mingo Amber made aware of patients lactic acid.

## 2013-12-28 NOTE — ED Notes (Signed)
Cg-4 reported to Dr. Mingo Amber

## 2013-12-28 NOTE — ED Notes (Signed)
Pt wife called EMS when she noticed he was not acting like himself.  MAE.  EMS reports stroke scale negative.  Pt does not know why he is here. Appears to be out of breath.

## 2013-12-28 NOTE — ED Notes (Signed)
Wife reports family was to go out to dinner but pt did not feel like driving.  After riding in car, he did not feel well and they went home.  Pt ate a frosty and laid down.  Pt is a very poor historian, states "I don't know" when asked questions.  Wife states that is usual for him and that he does not tell her anything either.  Pt finally admitted to abdominal pain and low back pain.

## 2013-12-29 ENCOUNTER — Emergency Department (HOSPITAL_COMMUNITY): Payer: Medicare Other

## 2013-12-29 ENCOUNTER — Encounter (HOSPITAL_COMMUNITY): Payer: Self-pay | Admitting: Radiology

## 2013-12-29 DIAGNOSIS — IMO0002 Reserved for concepts with insufficient information to code with codable children: Secondary | ICD-10-CM | POA: Diagnosis present

## 2013-12-29 DIAGNOSIS — K7682 Hepatic encephalopathy: Secondary | ICD-10-CM

## 2013-12-29 DIAGNOSIS — A419 Sepsis, unspecified organism: Principal | ICD-10-CM

## 2013-12-29 DIAGNOSIS — E1165 Type 2 diabetes mellitus with hyperglycemia: Secondary | ICD-10-CM | POA: Diagnosis present

## 2013-12-29 DIAGNOSIS — K529 Noninfective gastroenteritis and colitis, unspecified: Secondary | ICD-10-CM | POA: Diagnosis present

## 2013-12-29 DIAGNOSIS — I517 Cardiomegaly: Secondary | ICD-10-CM

## 2013-12-29 DIAGNOSIS — K7290 Hepatic failure, unspecified without coma: Secondary | ICD-10-CM | POA: Diagnosis present

## 2013-12-29 DIAGNOSIS — K5289 Other specified noninfective gastroenteritis and colitis: Secondary | ICD-10-CM

## 2013-12-29 DIAGNOSIS — R6520 Severe sepsis without septic shock: Secondary | ICD-10-CM

## 2013-12-29 DIAGNOSIS — K7460 Unspecified cirrhosis of liver: Secondary | ICD-10-CM

## 2013-12-29 LAB — PROTIME-INR
INR: 1.2 (ref 0.00–1.49)
Prothrombin Time: 14.9 seconds (ref 11.6–15.2)

## 2013-12-29 LAB — GLUCOSE, CAPILLARY
GLUCOSE-CAPILLARY: 105 mg/dL — AB (ref 70–99)
GLUCOSE-CAPILLARY: 192 mg/dL — AB (ref 70–99)
GLUCOSE-CAPILLARY: 280 mg/dL — AB (ref 70–99)
Glucose-Capillary: 118 mg/dL — ABNORMAL HIGH (ref 70–99)
Glucose-Capillary: 229 mg/dL — ABNORMAL HIGH (ref 70–99)

## 2013-12-29 LAB — CBC WITH DIFFERENTIAL/PLATELET
BASOS PCT: 0 % (ref 0–1)
Basophils Absolute: 0 10*3/uL (ref 0.0–0.1)
EOS PCT: 0 % (ref 0–5)
Eosinophils Absolute: 0 10*3/uL (ref 0.0–0.7)
HEMATOCRIT: 38.7 % — AB (ref 39.0–52.0)
Hemoglobin: 13.1 g/dL (ref 13.0–17.0)
LYMPHS ABS: 1.3 10*3/uL (ref 0.7–4.0)
Lymphocytes Relative: 10 % — ABNORMAL LOW (ref 12–46)
MCH: 33.5 pg (ref 26.0–34.0)
MCHC: 33.9 g/dL (ref 30.0–36.0)
MCV: 99 fL (ref 78.0–100.0)
MONO ABS: 1.4 10*3/uL — AB (ref 0.1–1.0)
MONOS PCT: 11 % (ref 3–12)
NEUTROS ABS: 10.2 10*3/uL — AB (ref 1.7–7.7)
Neutrophils Relative %: 79 % — ABNORMAL HIGH (ref 43–77)
PLATELETS: 59 10*3/uL — AB (ref 150–400)
RBC: 3.91 MIL/uL — AB (ref 4.22–5.81)
RDW: 13.8 % (ref 11.5–15.5)
WBC: 12.9 10*3/uL — ABNORMAL HIGH (ref 4.0–10.5)

## 2013-12-29 LAB — URINALYSIS, ROUTINE W REFLEX MICROSCOPIC
GLUCOSE, UA: NEGATIVE mg/dL
HGB URINE DIPSTICK: NEGATIVE
Ketones, ur: 15 mg/dL — AB
Leukocytes, UA: NEGATIVE
Nitrite: NEGATIVE
PH: 6 (ref 5.0–8.0)
Protein, ur: NEGATIVE mg/dL
Specific Gravity, Urine: 1.027 (ref 1.005–1.030)
Urobilinogen, UA: 1 mg/dL (ref 0.0–1.0)

## 2013-12-29 LAB — COMPREHENSIVE METABOLIC PANEL
ALBUMIN: 2.7 g/dL — AB (ref 3.5–5.2)
ALT: 50 U/L (ref 0–53)
AST: 63 U/L — AB (ref 0–37)
Alkaline Phosphatase: 95 U/L (ref 39–117)
BILIRUBIN TOTAL: 2.4 mg/dL — AB (ref 0.3–1.2)
BUN: 14 mg/dL (ref 6–23)
CALCIUM: 8.8 mg/dL (ref 8.4–10.5)
CHLORIDE: 105 meq/L (ref 96–112)
CO2: 21 meq/L (ref 19–32)
Creatinine, Ser: 0.85 mg/dL (ref 0.50–1.35)
GFR calc Af Amer: >90 mL/min (ref >90)
GFR calc non Af Amer: >90 mL/min (ref >90)
Glucose, Bld: 108 mg/dL — ABNORMAL HIGH (ref 70–99)
Potassium: 4.9 mEq/L (ref 3.7–5.3)
SODIUM: 141 meq/L (ref 137–147)
Total Protein: 6.1 g/dL (ref 6.0–8.3)

## 2013-12-29 LAB — HEPATIC FUNCTION PANEL
ALBUMIN: 2.9 g/dL — AB (ref 3.5–5.2)
ALK PHOS: 142 U/L — AB (ref 39–117)
ALT: 51 U/L (ref 0–53)
AST: 69 U/L — ABNORMAL HIGH (ref 0–37)
Bilirubin, Direct: 0.4 mg/dL — ABNORMAL HIGH (ref 0.0–0.3)
Indirect Bilirubin: 1 mg/dL — ABNORMAL HIGH (ref 0.3–0.9)
TOTAL PROTEIN: 6.6 g/dL (ref 6.0–8.3)
Total Bilirubin: 1.4 mg/dL — ABNORMAL HIGH (ref 0.3–1.2)

## 2013-12-29 LAB — LACTIC ACID, PLASMA
Lactic Acid, Venous: 3.2 mmol/L — ABNORMAL HIGH (ref 0.5–2.2)
Lactic Acid, Venous: 2.9 mmol/L — ABNORMAL HIGH (ref 0.5–2.2)

## 2013-12-29 LAB — TSH: TSH: 1.63 u[IU]/mL (ref 0.350–4.500)

## 2013-12-29 LAB — AMMONIA
Ammonia: 125 umol/L — ABNORMAL HIGH (ref 11–60)
Ammonia: 139 umol/L — ABNORMAL HIGH (ref 11–60)

## 2013-12-29 LAB — CLOSTRIDIUM DIFFICILE BY PCR: CDIFFPCR: NEGATIVE

## 2013-12-29 LAB — HEMOGLOBIN A1C
Hgb A1c MFr Bld: 8.4 % — ABNORMAL HIGH (ref <5.7)
Mean Plasma Glucose: 194 mg/dL — ABNORMAL HIGH (ref <117)

## 2013-12-29 LAB — PHOSPHORUS: PHOSPHORUS: 4.3 mg/dL (ref 2.3–4.6)

## 2013-12-29 LAB — MAGNESIUM: Magnesium: 1.3 mg/dL — ABNORMAL LOW (ref 1.5–2.5)

## 2013-12-29 LAB — PROCALCITONIN: Procalcitonin: 7.26 ng/mL

## 2013-12-29 LAB — TROPONIN I

## 2013-12-29 MED ORDER — ACETAMINOPHEN 325 MG PO TABS
650.0000 mg | ORAL_TABLET | Freq: Once | ORAL | Status: DC
  Filled 2013-12-29: qty 2

## 2013-12-29 MED ORDER — ALBUTEROL SULFATE (2.5 MG/3ML) 0.083% IN NEBU: 2.5000 mg | INHALATION_SOLUTION | RESPIRATORY_TRACT | Status: DC | PRN

## 2013-12-29 MED ORDER — IOHEXOL 300 MG/ML  SOLN
100.0000 mL | Freq: Once | INTRAMUSCULAR | Status: AC | PRN
  Administered 2013-12-29: 100 mL via INTRAVENOUS

## 2013-12-29 MED ORDER — ONDANSETRON HCL 4 MG PO TABS: 4.0000 mg | ORAL_TABLET | Freq: Four times a day (QID) | ORAL | Status: DC | PRN

## 2013-12-29 MED ORDER — INSULIN ASPART 100 UNIT/ML ~~LOC~~ SOLN
0.0000 [IU] | SUBCUTANEOUS | Status: DC
  Administered 2013-12-29: 5 [IU] via SUBCUTANEOUS
  Administered 2013-12-29: 3 [IU] via SUBCUTANEOUS
  Administered 2013-12-29: 2 [IU] via SUBCUTANEOUS
  Administered 2013-12-30: 3 [IU] via SUBCUTANEOUS
  Administered 2013-12-30: 1 [IU] via SUBCUTANEOUS

## 2013-12-29 MED ORDER — LACTULOSE 10 GM/15ML PO SOLN
30.0000 g | Freq: Three times a day (TID) | ORAL | Status: DC
  Administered 2013-12-29: 30 g via ORAL
  Filled 2013-12-29 (×6): qty 45

## 2013-12-29 MED ORDER — PIPERACILLIN-TAZOBACTAM 3.375 G IVPB 30 MIN: 3.3750 g | Freq: Three times a day (TID) | INTRAVENOUS | Status: DC

## 2013-12-29 MED ORDER — ACETAMINOPHEN 325 MG PO TABS: 650.0000 mg | ORAL_TABLET | Freq: Four times a day (QID) | ORAL | Status: DC | PRN

## 2013-12-29 MED ORDER — HYDROMORPHONE HCL PF 1 MG/ML IJ SOLN: 0.5000 mg | INTRAMUSCULAR | Status: DC | PRN

## 2013-12-29 MED ORDER — SODIUM CHLORIDE 0.9 % IJ SOLN
3.0000 mL | Freq: Two times a day (BID) | INTRAMUSCULAR | Status: DC
  Administered 2013-12-29 – 2013-12-31 (×5): 3 mL via INTRAVENOUS

## 2013-12-29 MED ORDER — ACETAMINOPHEN 650 MG RE SUPP: 650.0000 mg | Freq: Four times a day (QID) | RECTAL | Status: DC | PRN

## 2013-12-29 MED ORDER — ONDANSETRON HCL 4 MG/2ML IJ SOLN: 4.0000 mg | Freq: Four times a day (QID) | INTRAMUSCULAR | Status: DC | PRN

## 2013-12-29 MED ORDER — SODIUM CHLORIDE 0.9 % IV SOLN: Freq: Once | INTRAVENOUS | Status: DC

## 2013-12-29 MED ORDER — LACTULOSE 10 GM/15ML PO SOLN
20.0000 g | Freq: Once | ORAL | Status: AC
  Administered 2013-12-29: 20 g via ORAL
  Filled 2013-12-29: qty 30

## 2013-12-29 MED ORDER — SODIUM CHLORIDE 0.9 % IV SOLN
Freq: Once | INTRAVENOUS | Status: AC
  Administered 2013-12-29: 06:00:00 via INTRAVENOUS

## 2013-12-29 MED ORDER — INSULIN GLARGINE 100 UNIT/ML ~~LOC~~ SOLN
25.0000 [IU] | Freq: Every day | SUBCUTANEOUS | Status: DC
Start: 2013-12-29 — End: 2014-01-01
  Administered 2013-12-29 – 2013-12-31 (×3): 25 [IU] via SUBCUTANEOUS
  Filled 2013-12-29 (×4): qty 0.25

## 2013-12-29 MED ORDER — PIPERACILLIN-TAZOBACTAM 3.375 G IVPB
3.3750 g | Freq: Three times a day (TID) | INTRAVENOUS | Status: DC
  Administered 2013-12-29 – 2013-12-30 (×4): 3.375 g via INTRAVENOUS
  Filled 2013-12-29 (×7): qty 50

## 2013-12-29 MED ORDER — PANTOPRAZOLE SODIUM 40 MG PO TBEC
40.0000 mg | DELAYED_RELEASE_TABLET | Freq: Every day | ORAL | Status: DC
  Administered 2013-12-29 – 2014-01-01 (×4): 40 mg via ORAL
  Filled 2013-12-29 (×4): qty 1

## 2013-12-29 MED ORDER — IPRATROPIUM BROMIDE 0.02 % IN SOLN
0.5000 mg | Freq: Four times a day (QID) | RESPIRATORY_TRACT | Status: DC
  Administered 2013-12-29 – 2013-12-30 (×4): 0.5 mg via RESPIRATORY_TRACT
  Filled 2013-12-29 (×4): qty 2.5

## 2013-12-29 NOTE — ED Notes (Signed)
Patient transported to CT 

## 2013-12-29 NOTE — Progress Notes (Signed)
[facility] PROGRESS NOTE  Adam Butler CHE:[id number] DOB: 12-07-1955 DOA: 12/28/2013 PCP: Antonietta Jewel, MD  Assessment/Plan: 58 yo M with hx of cryptogenic cirrhosis with poor follow up here with colitis and possible sepsis in the setting of liver failure and Hepatic encephalopathy.   . Sepsis  -due to colitis, continue zosyn  -blood cultures pending,  procalcitonin up -lactic acid improving -colitis-ischemic vs inflammatory with partial thrombosis of Splenic and SMV -will ask GI to eval  . Cirrhosis of liver  -likely due to NASH -workup for other causes of cirrhosis negative in 2012  . Diastolic CHF, chronic compensated, hold lasix   . Encephalopathy, hepatic  - lactulose PO since able to tolerate and titrate as needed.  -mentation improving  Thrombocytopenia -due to cirrhosis  DM -stable,  -continue SSI and lantus 25 units qhs   Prophylaxis: SCD, Protonix   CODE STATUS: DNR/DNI  Family Communication: d/w wife at bedside Disposition Plan: keep in SDU   Consultants:   HPI/Subjective: Still with some abd pain,  But improved  Objective: Filed Vitals:   12/29/13 0751  BP: 133/50  Pulse: 81  Temp: 98.2 F (36.8 C)  Resp: 25   No intake or output data in the 24 hours ending 12/29/13 1044 Filed Weights   12/28/13 2223 12/29/13 0700  Weight: 127.007 kg (280 lb) 132.6 kg (292 lb 5.3 oz)    Exam:   General:  AAOx to self, place, no distress  Cardiovascular: S1S2/RRR  Respiratory: diminished BS at bases  Abdomen: soft, Tender in RLQ and LLQ, BS present but diminished  Musculoskeletal: trace edema c/c  Neuro: no asterixes, non focal  Data Reviewed: Basic Metabolic Panel:  Recent Labs Lab 12/28/13 2236 12/29/13 0600  NA 137 141  K 4.0 4.9  CL 100 105  CO2 20 21  GLUCOSE 174* 108*  BUN 13 14  CREATININE 0.72 0.85  CALCIUM 9.2 8.8  MG  --  1.3*  PHOS  --  4.3   Liver Function Tests:  Recent Labs Lab 12/28/13 2236  12/29/13 0600  AST 69* 63*  ALT 51 50  ALKPHOS 142* 95  BILITOT 1.4* 2.4*  PROT 6.6 6.1  ALBUMIN 2.9* 2.7*   No results found for this basename: LIPASE, AMYLASE,  in the last 168 hours  Recent Labs Lab 12/29/13 0015 12/29/13 0600  AMMONIA 139* 125*   CBC:  Recent Labs Lab 12/28/13 2236 12/29/13 0600  WBC 9.7 12.9*  NEUTROABS  --  10.2*  HGB 14.1 13.1  HCT 39.8 38.7*  MCV 97.3 99.0  PLT 69* 59*   Cardiac Enzymes:  Recent Labs Lab 12/29/13 0538  TROPONINI <0.30   BNP (last 3 results) No results found for this basename: PROBNP,  in the last 8760 hours CBG:  Recent Labs Lab 12/29/13 0537 12/29/13 0737  GLUCAP 105* 118*    No results found for this or any previous visit (from the past 240 hour(s)).   Studies: Dg Chest 2 View  12/28/2013   CLINICAL DATA:  Altered mental status. Shortness of breath and back pain.  EXAM: CHEST  2 VIEW  COMPARISON:  Chest radiograph performed 04/19/2013  FINDINGS: The lungs are hypoexpanded. Vascular crowding is noted. Minimal bilateral atelectasis is seen. There is no evidence of pleural effusion or pneumothorax.  The heart is borderline enlarged. No acute osseous abnormalities are seen.  IMPRESSION: Lungs hypoexpanded and difficult to fully assess. Minimal bilateral atelectasis noted. Borderline cardiomegaly.   Electronically Signed   By: [name]  Chang M.D.   On: 12/28/2013 23:28   Ct Head Wo Contrast  12/29/2013   CLINICAL DATA:  Altered level of consciousness.  Frontal headache.  EXAM: CT HEAD WITHOUT CONTRAST  TECHNIQUE: Contiguous axial images were obtained from the base of the skull through the vertex without intravenous contrast.  COMPARISON:  CT of the head performed 07/03/2010  FINDINGS: There is no evidence of acute infarction, mass lesion, or intra- or extra-axial hemorrhage on CT.  Scattered subcortical white matter change likely reflects small vessel ischemic microangiopathy. A small chronic infarct is noted at the right  occipital lobe.  The posterior fossa, including the cerebellum, brainstem and fourth ventricle, is within normal limits. The third and lateral ventricles, and basal ganglia are unremarkable in appearance. No mass effect or midline shift is seen.  There is no evidence of fracture; visualized osseous structures are unremarkable in appearance. The orbits are within normal limits. Mild mucosal thickening is noted at the right side of the sphenoid sinus. The remaining paranasal sinuses and mastoid air cells are well-aerated. No significant soft tissue abnormalities are seen. Apparent focal soft tissue thickening along the right posterior side of the neck is thought to reflect volume averaging.  IMPRESSION: 1. No acute intracranial pathology seen on CT. 2. Scattered small vessel ischemic microangiopathy. 3. Small chronic infarct at the right occipital lobe. 4. Mild mucosal thickening at the right side of the sphenoid sinus.   Electronically Signed   By: Garald Balding M.D.   On: 12/29/2013 01:36   Ct Abdomen Pelvis W Contrast  12/29/2013   CLINICAL DATA:  Frontal headache, left lower quadrant abdominal pain  EXAM: CT ABDOMEN AND PELVIS WITH CONTRAST  TECHNIQUE: Multidetector CT imaging of the abdomen and pelvis was performed using the standard protocol following bolus administration of intravenous contrast.  COMPARISON:  none  CONTRAST:  123m OMNIPAQUE IOHEXOL 300 MG/ML  SOLN  FINDINGS: Lung bases are clear.  No pericardial fluid.  The liver has a lobular contour consistent with cirrhosis. There are gallstones layering dependently in the gallbladder. No evidence of pancreatic inflammation. The spleen is enlarged. This extensive venous collaterals extending along the splenic hilum and along the esophagus. The left portal vein is patent. Right portal vein is diminutive. There is haziness the central mesentery.  Adrenal glands and kidneys are normal.  The stomach, small bowel cecum are normal.  There is thickening of the  ascending colon with pericolonic inflammation. This extends into the transverse colon. The rectosigmoid colon appears normal.  Abdominal or is normal caliber. intimal calcification of the aorta.  There is a partially occlusive thrombus within the superior mesenteric vein. There partially occlusive thrombus within the splenic vein which is partially occlusive. There is recanalization of the umbilical vein.  No free fluid the pelvis. The prostate gland and bladder normal. No pelvic lymphadenopathy. No aggressive osseous lesion.  IMPRESSION: 1. Haziness within the central mesentery which could relate to portal hypertension. 2. There is bowel wall thickening involving the ascending colon and mid transverse colon. This segmental bowel wall thickening/edema could represent ischemic change. Secondary differentials would include inflammatory bowel disease and infectious colitis or drug-induced colitis. The edema could represent passive congestion from the partial venous occlusion. 3. Cirrhosis and portal hypertension with multiple venous collaterals. 4. Partial thrombosis of the splenic vein and superior mesenteric vein.   Electronically Signed   By: SSuzy BouchardM.D.   On: 12/29/2013 01:45    Scheduled Meds: . insulin aspart  0-9 Units Subcutaneous  6 times per day  . insulin glargine  25 Units Subcutaneous QHS  . ipratropium  0.5 mg Nebulization Q6H  . lactulose  30 g Oral TID  . pantoprazole  40 mg Oral Q1200  . piperacillin-tazobactam  3.375 g Intravenous Q8H  . sodium chloride  3 mL Intravenous Q12H   Continuous Infusions:  Antibiotics Given (last 72 hours)   Date/Time Action Medication Dose Rate   12/29/13 0633 Given   piperacillin-tazobactam (ZOSYN) IVPB 3.375 g 3.375 g 12.5 mL/hr      Active Problems:   DIASTOLIC HEART FAILURE, CHRONIC   Cirrhosis of liver without mention of alcohol   Encephalopathy, hepatic   Colitis   Hepatic encephalopathy   Sepsis   Diabetes mellitus    Time  spent: 27mn    Annelie Boak  [facility] Pager [phone] If 7PM-7AM, please contact night-coverage at [email or web address], password [facility]6/21/2015, 10:44 AM  LOS: 1 day

## 2013-12-29 NOTE — ED Notes (Signed)
Report to [facility] on 3S.  Pt to floor with RN accompanying.

## 2013-12-29 NOTE — H&P (Signed)
PCP:  Antonietta Jewel, MD    Chief Complaint:  confusion  HPI: Adam Butler is a 58 y.o. male   has a past medical history of Esophageal varices; Cirrhosis; Hyperlipemia; Chronic heart failure; Coronary artery disease; Diastolic CHF, acute; Hypertension; Type II diabetes mellitus; SVT (supraventricular tachycardia); GI bleed; Shortness of breath; and S/P coronary artery stent placement.   Presented with  Patient went on a camping trip last week. When patient returned he had increased work of breathing and increased somnolence. He have had back pain and abdominal pain. Some diarrhea for the past few days and some nausea. Patient has trouble remembering what happened. He is not taking lactulose. He has hx of cryptogenic cirrhosis. Anomia level was 139. CT scan showed partial portal and splenic vein thrombosis likely chronic and evidence of colitis. Lactic acid noted elevated to  3.2. Wife states he stopped taking his medications and stopped seeing liver doctor. Patient is confused and unsure about his care at this point.   Hospitalist was called for admission for colitis  Review of Systems:    Pertinent positives include:   Fevers, chills, somnolence confusion,  abdominal pain, nausea, shortness of breath at rest.   dyspnea on exertion, Bilateral lower extremity swelling  diarrhea,  Constitutional:  No weight loss, night sweats,fatigue, weight loss  HEENT:  No headaches, Difficulty swallowing,Tooth/dental problems,Sore throat,  No sneezing, itching, ear ache, nasal congestion, post nasal drip,  Cardio-vascular:  No chest pain, Orthopnea, PND, anasarca, dizziness, palpitations.no  GI:  No heartburn, indigestion,vomiting, change in bowel habits, loss of appetite, melena, blood in stool, hematemesis Resp:  no  No excess mucus, no productive cough, No non-productive cough, No coughing up of blood.No change in color of mucus.No wheezing. Skin:  no rash or lesions. No jaundice GU:  no  dysuria, change in color of urine, no urgency or frequency. No straining to urinate.  No flank pain.  Musculoskeletal:  No joint pain or no joint swelling. No decreased range of motion. No back pain.  Psych:  No change in mood or affect. No depression or anxiety. No memory loss.  Neuro: no localizing neurological complaints, no tingling, no weakness, no double vision, no gait abnormality, no slurred speech, no confusion  Otherwise ROS are negative except for above, 10 systems were reviewed  Past Medical History: Past Medical History  Diagnosis Date  . Esophageal varices   . Cirrhosis     Cryptogenic, ? NASH. Family and patient deny EtOH. HCV, HBV, and HAV workup negative. ANA negative. AMA positive. Grade II esophageal varices on EGD with no evidence for bleedig in 12/11. He had hepatic encephalopaty and ascites in 12/11  . Hyperlipemia   . Chronic heart failure   . Coronary artery disease     Inferior MI 12/11; LHC with occluded mid CFX and 80% proximal RCA. EF 55%. He had 3.0 x 28 vision BMS to CFX  . Diastolic CHF, acute     Echo 12/11 with ef 50-55% and mild LVH. EF 55% by LV0gram in 12/11  . Hypertension   . Type II diabetes mellitus   . SVT (supraventricular tachycardia)     1/12: appeared to be an ectopic atrial tachycardia. Required DCCV with hemodynamic instability  . GI bleed     12/11: Etiology not clearly defined. EGD showed nonbleeding esophageal varices.  . Shortness of breath   . S/P coronary artery stent placement    Past Surgical History  Procedure Laterality Date  . Cardiac catheterization  07/01/2010    with stent placement  . Appendectomy    . Orif r leg    . Esophagogastroduodenoscopy  04/13/2012    Procedure: ESOPHAGOGASTRODUODENOSCOPY (EGD);  Surgeon: Inda Castle, MD;  Location: [facility];  Service: Endoscopy;  Laterality: N/A;  . Colonoscopy  04/13/2012    Procedure: COLONOSCOPY;  Surgeon: Inda Castle, MD;  Location: [facility];  Service:  Endoscopy;  Laterality: N/A;     Medications: Prior to Admission medications   Medication Sig Start Date End Date Taking? Authorizing Provider  aspirin 81 MG tablet Take 81 mg by mouth daily.    Yes Historical Provider, MD  furosemide (LASIX) 40 MG tablet Take 1 tablet (40 mg total) by mouth daily. 01/09/12  Yes Larey Dresser, MD  insulin NPH Human (HUMULIN N,NOVOLIN N) 100 UNIT/ML injection Inject 20-80 Units into the skin See admin instructions. Take 80 units in the morning and 50 units in the evening.   Yes Historical Provider, MD  lisinopril (PRINIVIL,ZESTRIL) 2.5 MG tablet Take 2.5 mg by mouth daily.   Yes Historical Provider, MD  metFORMIN (GLUCOPHAGE) 500 MG tablet Take 1,000 mg by mouth 2 (two) times daily.    Yes Historical Provider, MD  Multiple Vitamin (MULTIVITAMIN) capsule Take 1 capsule by mouth daily.     Yes Historical Provider, MD  nitroGLYCERIN (NITROSTAT) 0.4 MG SL tablet Place 1 tablet (0.4 mg total) under the tongue every 5 (five) minutes as needed. 01/09/12  Yes Larey Dresser, MD    Allergies:   Allergies  Allergen Reactions  . Sulfa Antibiotics     Social History:  Ambulatory  independently   Lives at home With family    reports that he has been smoking Cigarettes.  He has a 36 pack-year smoking history. He has never used smokeless tobacco. He reports that he does not drink alcohol or use illicit drugs.    Family History: family history includes COPD in his mother; Diabetes in his brother and father; Heart attack in his father; Heart attack (age of onset: 32) in his brother; Heart attack (age of onset: 45) in his brother.    Physical Exam: Patient Vitals for the past 24 hrs:  BP Temp Temp src Pulse Resp SpO2 Height Weight  12/29/13 0115 109/50 mmHg - - 97 20 97 % - -  12/29/13 0030 125/62 mmHg - - 95 30 94 % - -  12/29/13 0015 117/66 mmHg - - 94 25 96 % - -  12/29/13 0000 - 101.4 F (38.6 C) Rectal - - - - -  12/29/13 0000 119/64 mmHg - - 95 17 95 %  - -  12/28/13 2345 121/54 mmHg - - 103 28 95 % - -  12/28/13 2330 135/67 mmHg - - 95 30 94 % - -  12/28/13 2315 138/59 mmHg - - 100 27 94 % - -  12/28/13 2304 133/60 mmHg - - 96 28 95 % - -  12/28/13 2230 124/56 mmHg - - 97 34 96 % - -  12/28/13 2226 - - - - - 96 % - -  12/28/13 2223 126/64 mmHg 100.3 F (37.9 C) Oral 98 30 96 % 5' 3"  (1.6 m) 127.007 kg (280 lb)    1. General:  in No Acute distress 2. Psychological: Alert but not Oriented 3. Head/ENT:    Dry Mucous Membranes  Head Non traumatic, neck supple                          Normal   Dentition 4. SKIN: normal   Skin turgor,  Skin clean Dry and intact no rash 5. Heart: Regular rate and rhythm no Murmur, Rub or gallop 6. Lungs: occasional wheezes no crackles, distant breath sounds   7. Abdomen: Soft, generalized tenderness,  distended 8. Lower extremities: no clubbing, cyanosis, trace edema 9. Neurologically Grossly intact, moving all 4 extremities equally, asterixis presetn 10. MSK: Normal range of motion  body mass index is 49.61 kg/(m^2).   Labs on Admission:   Recent Labs  12/28/13 2236  NA 137  K 4.0  CL 100  CO2 20  GLUCOSE 174*  BUN 13  CREATININE 0.72  CALCIUM 9.2    Recent Labs  12/28/13 2236  AST 69*  ALT 51  ALKPHOS 142*  BILITOT 1.4*  PROT 6.6  ALBUMIN 2.9*   No results found for this basename: LIPASE, AMYLASE,  in the last 72 hours  Recent Labs  12/28/13 2236  WBC 9.7  HGB 14.1  HCT 39.8  MCV 97.3  PLT 69*   No results found for this basename: CKTOTAL, CKMB, CKMBINDEX, TROPONINI,  in the last 72 hours No results found for this basename: TSH, T4TOTAL, FREET3, T3FREE, THYROIDAB,  in the last 72 hours No results found for this basename: VITAMINB12, FOLATE, FERRITIN, TIBC, IRON, RETICCTPCT,  in the last 72 hours Lab Results  Component Value Date   HGBA1C  Value: 10.9 (NOTE)                                                                       According to the  ADA Clinical Practice Recommendations for 2011, when HbA1c is used as a screening test:   >=6.5%   Diagnostic of Diabetes Mellitus           (if abnormal result  is confirmed)  5.7-6.4%   Increased risk of developing Diabetes Mellitus  References:Diagnosis and Classification of Diabetes Mellitus,Diabetes OJJK,0938,18(EXHBZ 1):S62-S69 and Standards of Medical Care in         Diabetes - 2011,Diabetes Care,2011,34  (Suppl 1):S11-S61.* 07/01/2010    Estimated Creatinine Clearance: 122.3 ml/min (by C-G formula based on Cr of 0.72). ABG    Component Value Date/Time   PHART 7.429 07/12/2010 0352   HCO3 25.1* 07/12/2010 0352   TCO2 26.3 07/12/2010 0352   ACIDBASEDEF 0.9 07/05/2010 0446   O2SAT 98.4 07/12/2010 0352     No results found for this basename: DDIMER     Other results:  I have pearsonaly reviewed this: ECG REPORT  Rate: 102  Rhythm: sinus tachycardia ST&T Change: no acute ischemia  BNP (last 3 results) No results found for this basename: PROBNP,  in the last 8760 hours  Filed Weights   12/28/13 2223  Weight: 127.007 kg (280 lb)  Cultures:    Component Value Date/Time   SDES BLOOD LEFT HAND 07/10/2010 1130   SDES TRACHEAL ASPIRATE 07/10/2010 1130   SPECREQUEST BOTTLES DRAWN AEROBIC ONLY 10CC 07/10/2010 1130   SPECREQUEST NONE 07/10/2010 1130   CULT NO GROWTH 5 DAYS 07/10/2010 1130  CULT FEW YEAST CONSISTENT WITH CANDIDA SPECIES 07/10/2010 1130   REPTSTATUS 07/16/2010 FINAL 07/10/2010 1130   REPTSTATUS 07/12/2010 FINAL 07/10/2010 1130   Radiological Exams on Admission: Dg Chest 2 View  12/28/2013   CLINICAL DATA:  Altered mental status. Shortness of breath and back pain.  EXAM: CHEST  2 VIEW  COMPARISON:  Chest radiograph performed 04/19/2013  FINDINGS: The lungs are hypoexpanded. Vascular crowding is noted. Minimal bilateral atelectasis is seen. There is no evidence of pleural effusion or pneumothorax.  The heart is borderline enlarged. No acute osseous abnormalities are seen.   IMPRESSION: Lungs hypoexpanded and difficult to fully assess. Minimal bilateral atelectasis noted. Borderline cardiomegaly.   Electronically Signed   By: Garald Balding M.D.   On: 12/28/2013 23:28   Ct Head Wo Contrast  12/29/2013   CLINICAL DATA:  Altered level of consciousness.  Frontal headache.  EXAM: CT HEAD WITHOUT CONTRAST  TECHNIQUE: Contiguous axial images were obtained from the base of the skull through the vertex without intravenous contrast.  COMPARISON:  CT of the head performed 07/03/2010  FINDINGS: There is no evidence of acute infarction, mass lesion, or intra- or extra-axial hemorrhage on CT.  Scattered subcortical white matter change likely reflects small vessel ischemic microangiopathy. A small chronic infarct is noted at the right occipital lobe.  The posterior fossa, including the cerebellum, brainstem and fourth ventricle, is within normal limits. The third and lateral ventricles, and basal ganglia are unremarkable in appearance. No mass effect or midline shift is seen.  There is no evidence of fracture; visualized osseous structures are unremarkable in appearance. The orbits are within normal limits. Mild mucosal thickening is noted at the right side of the sphenoid sinus. The remaining paranasal sinuses and mastoid air cells are well-aerated. No significant soft tissue abnormalities are seen. Apparent focal soft tissue thickening along the right posterior side of the neck is thought to reflect volume averaging.  IMPRESSION: 1. No acute intracranial pathology seen on CT. 2. Scattered small vessel ischemic microangiopathy. 3. Small chronic infarct at the right occipital lobe. 4. Mild mucosal thickening at the right side of the sphenoid sinus.   Electronically Signed   By: Garald Balding M.D.   On: 12/29/2013 01:36   Ct Abdomen Pelvis W Contrast  12/29/2013   CLINICAL DATA:  Frontal headache, left lower quadrant abdominal pain  EXAM: CT ABDOMEN AND PELVIS WITH CONTRAST  TECHNIQUE:  Multidetector CT imaging of the abdomen and pelvis was performed using the standard protocol following bolus administration of intravenous contrast.  COMPARISON:  none  CONTRAST:  131m OMNIPAQUE IOHEXOL 300 MG/ML  SOLN  FINDINGS: Lung bases are clear.  No pericardial fluid.  The liver has a lobular contour consistent with cirrhosis. There are gallstones layering dependently in the gallbladder. No evidence of pancreatic inflammation. The spleen is enlarged. This extensive venous collaterals extending along the splenic hilum and along the esophagus. The left portal vein is patent. Right portal vein is diminutive. There is haziness the central mesentery.  Adrenal glands and kidneys are normal.  The stomach, small bowel cecum are normal.  There is thickening of the ascending colon with pericolonic inflammation. This extends into the transverse colon. The rectosigmoid colon appears normal.  Abdominal or is normal caliber. intimal calcification of the aorta.  There is a partially occlusive thrombus within the superior mesenteric vein. There partially occlusive thrombus within the splenic vein which is partially occlusive. There is recanalization of the umbilical vein.  No free fluid the pelvis.  The prostate gland and bladder normal. No pelvic lymphadenopathy. No aggressive osseous lesion.  IMPRESSION: 1. Haziness within the central mesentery which could relate to portal hypertension. 2. There is bowel wall thickening involving the ascending colon and mid transverse colon. This segmental bowel wall thickening/edema could represent ischemic change. Secondary differentials would include inflammatory bowel disease and infectious colitis or drug-induced colitis. The edema could represent passive congestion from the partial venous occlusion. 3. Cirrhosis and portal hypertension with multiple venous collaterals. 4. Partial thrombosis of the splenic vein and superior mesenteric vein.   Electronically Signed   By: Suzy Bouchard M.D.   On: 12/29/2013 01:45    Chart has been reviewed  Assessment/Plan  58 yo M with hx of cryptogenic cirrhosis with poor follow up here with colitis and possible sepsis in the setting of liver failure and  Hepatic encephalopathy.   Present on Admission:  . Sepsis - likely due to colitis, zosyn blood cultures admit to stepdown, check procalcitonin . Cirrhosis of liver without mention of alcohol - check INR, evidence of liver failure . DIASTOLIC HEART FAILURE, CHRONIC - while possible sepsis and hypotension hold lasix. Will restart once stable . Encephalopathy, hepatic - lactulose PO since able to tolerate and titrate as needed.  . Colitis - cover with zosyn if no improvement will benefit from GI consult,  Thrombocytopenia in the setting of cirrhosis will obtain blood smear Dm hold PO meds will order SSI and lantus 25 units qhs   Prophylaxis: SCD, Protonix  CODE STATUS:   DNR/DNI  Other plan as per orders.  I have spent a total of 65 min on this admission case was discussed with PCCM who will monitor patient through e-link  Ryllie Nieland 12/29/2013, 3:10 AM  [facility]  Pager [phone]   If 7AM-7PM, please contact the day team taking care of the patient  [email or web address]  Password [email or web address]

## 2013-12-29 NOTE — Progress Notes (Signed)
  Echocardiogram 2D Echocardiogram has been performed.  Darlina Sicilian M 12/29/2013, 12:54 PM

## 2013-12-30 ENCOUNTER — Encounter (HOSPITAL_COMMUNITY): Payer: Self-pay | Admitting: Physician Assistant

## 2013-12-30 DIAGNOSIS — K766 Portal hypertension: Secondary | ICD-10-CM

## 2013-12-30 DIAGNOSIS — I8510 Secondary esophageal varices without bleeding: Secondary | ICD-10-CM

## 2013-12-30 DIAGNOSIS — I5032 Chronic diastolic (congestive) heart failure: Secondary | ICD-10-CM

## 2013-12-30 DIAGNOSIS — I8500 Esophageal varices without bleeding: Secondary | ICD-10-CM

## 2013-12-30 LAB — COMPREHENSIVE METABOLIC PANEL
ALBUMIN: 2.5 g/dL — AB (ref 3.5–5.2)
ALK PHOS: 63 U/L (ref 39–117)
ALT: 45 U/L (ref 0–53)
AST: 54 U/L — ABNORMAL HIGH (ref 0–37)
BUN: 13 mg/dL (ref 6–23)
CALCIUM: 8.7 mg/dL (ref 8.4–10.5)
CO2: 23 mEq/L (ref 19–32)
CREATININE: 0.78 mg/dL (ref 0.50–1.35)
Chloride: 102 mEq/L (ref 96–112)
GFR calc non Af Amer: >90 mL/min (ref >90)
GLUCOSE: 126 mg/dL — AB (ref 70–99)
Potassium: 3.6 mEq/L — ABNORMAL LOW (ref 3.7–5.3)
Sodium: 135 mEq/L — ABNORMAL LOW (ref 137–147)
TOTAL PROTEIN: 5.8 g/dL — AB (ref 6.0–8.3)
Total Bilirubin: 1.8 mg/dL — ABNORMAL HIGH (ref 0.3–1.2)

## 2013-12-30 LAB — CBC
HEMATOCRIT: 35.3 % — AB (ref 39.0–52.0)
HEMOGLOBIN: 12 g/dL — AB (ref 13.0–17.0)
MCH: 33.5 pg (ref 26.0–34.0)
MCHC: 34 g/dL (ref 30.0–36.0)
MCV: 98.6 fL (ref 78.0–100.0)
Platelets: 51 10*3/uL — ABNORMAL LOW (ref 150–400)
RBC: 3.58 MIL/uL — ABNORMAL LOW (ref 4.22–5.81)
RDW: 13.8 % (ref 11.5–15.5)
WBC: 7.2 10*3/uL (ref 4.0–10.5)

## 2013-12-30 LAB — GLUCOSE, CAPILLARY
GLUCOSE-CAPILLARY: 120 mg/dL — AB (ref 70–99)
GLUCOSE-CAPILLARY: 141 mg/dL — AB (ref 70–99)
Glucose-Capillary: 126 mg/dL — ABNORMAL HIGH (ref 70–99)
Glucose-Capillary: 225 mg/dL — ABNORMAL HIGH (ref 70–99)
Glucose-Capillary: 131 mg/dL — ABNORMAL HIGH (ref 70–99)
Glucose-Capillary: 192 mg/dL — ABNORMAL HIGH (ref 70–99)

## 2013-12-30 LAB — PATHOLOGIST SMEAR REVIEW

## 2013-12-30 MED ORDER — INSULIN ASPART 100 UNIT/ML ~~LOC~~ SOLN
0.0000 [IU] | Freq: Three times a day (TID) | SUBCUTANEOUS | Status: DC
  Administered 2013-12-30: 1 [IU] via SUBCUTANEOUS
  Administered 2013-12-31 (×3): 3 [IU] via SUBCUTANEOUS
  Administered 2014-01-01: 1 [IU] via SUBCUTANEOUS

## 2013-12-30 MED ORDER — LACTULOSE 10 GM/15ML PO SOLN
20.0000 g | Freq: Every day | ORAL | Status: DC
  Administered 2013-12-30 – 2013-12-31 (×2): 20 g via ORAL
  Filled 2013-12-30 (×2): qty 30

## 2013-12-30 MED ORDER — CIPROFLOXACIN HCL 500 MG PO TABS
500.0000 mg | ORAL_TABLET | Freq: Two times a day (BID) | ORAL | Status: DC
  Administered 2013-12-30 – 2013-12-31 (×4): 500 mg via ORAL
  Filled 2013-12-30 (×7): qty 1

## 2013-12-30 MED ORDER — RIFAXIMIN 550 MG PO TABS
550.0000 mg | ORAL_TABLET | Freq: Two times a day (BID) | ORAL | Status: DC
  Administered 2013-12-30 – 2014-01-01 (×4): 550 mg via ORAL
  Filled 2013-12-30 (×6): qty 1

## 2013-12-30 MED ORDER — METRONIDAZOLE 500 MG PO TABS
500.0000 mg | ORAL_TABLET | Freq: Three times a day (TID) | ORAL | Status: DC
  Administered 2013-12-30 – 2014-01-01 (×7): 500 mg via ORAL
  Filled 2013-12-30 (×9): qty 1

## 2013-12-30 NOTE — Progress Notes (Signed)
[facility] PROGRESS NOTE  Adam Butler XNT:[id number] DOB: Jul 13, 1955 DOA: 12/28/2013 PCP: Antonietta Jewel, MD  Assessment/Plan: 58 yo M with hx of cryptogenic cirrhosis with poor follow up here with colitis and possible sepsis in the setting of liver failure and Hepatic encephalopathy.   . Sepsis  -due to colitis,  -improving, change to PO Cipro/Flagyl -advance diet to full liq -blood cultures negative so far, Cdiff negative, Stool Cx pending -lactic acid improving -colitis-ischemic vs inflammatory with partial thrombosis of Splenic and SMV -will ask GI to eval  . Cirrhosis of liver  -likely due to NASH -workup for other causes of cirrhosis negative in 2012  . Diastolic CHF, chronic compensated, hold lasix   . Encephalopathy, hepatic  -improved, had 6-7BMs yesterday will cut down lactulose dose  Thrombocytopenia -due to cirrhosis  DM -stable,  -continue SSI and lantus 25 units qhs   Prophylaxis: SCD, Protonix   CODE STATUS: DNR/DNI  Family Communication: d/w wife at bedside 6/21 Disposition Plan: Tx to floor Ambulate   Consultants:   HPI/Subjective: Feels better, 6-7Bms with lactulose yest, abd pain much better  Objective: Filed Vitals:   12/30/13 0724  BP: 105/44  Pulse: 74  Temp: 98.5 F (36.9 C)  Resp: 25    Intake/Output Summary (Last 24 hours) at 12/30/13 0913 Last data filed at 12/30/13 0736  Gross per 24 hour  Intake    150 ml  Output   1175 ml  Net  -1025 ml   Filed Weights   12/28/13 2223 12/29/13 0700  Weight: 127.007 kg (280 lb) 132.6 kg (292 lb 5.3 oz)    Exam:   General:  AAOx to self, place, no distress, mentation much imporved  Cardiovascular: S1S2/RRR  Respiratory: diminished BS at bases  Abdomen: soft, much less Tender in RLQ and LLQ, BS present but diminished  Musculoskeletal: trace edema c/c  Neuro: no asterixes, non focal  Data Reviewed: Basic Metabolic Panel:  Recent Labs Lab 12/28/13 2236  12/29/13 0600 12/30/13 0248  NA 137 141 135*  K 4.0 4.9 3.6*  CL 100 105 102  CO2 20 21 23   GLUCOSE 174* 108* 126*  BUN 13 14 13   CREATININE 0.72 0.85 0.78  CALCIUM 9.2 8.8 8.7  MG  --  1.3*  --   PHOS  --  4.3  --    Liver Function Tests:  Recent Labs Lab 12/28/13 2236 12/29/13 0600 12/30/13 0248  AST 69* 63* 54*  ALT 51 50 45  ALKPHOS 142* 95 63  BILITOT 1.4* 2.4* 1.8*  PROT 6.6 6.1 5.8*  ALBUMIN 2.9* 2.7* 2.5*   No results found for this basename: LIPASE, AMYLASE,  in the last 168 hours  Recent Labs Lab 12/29/13 0015 12/29/13 0600  AMMONIA 139* 125*   CBC:  Recent Labs Lab 12/28/13 2236 12/29/13 0600 12/30/13 0248  WBC 9.7 12.9* 7.2  NEUTROABS  --  10.2*  --   HGB 14.1 13.1 12.0*  HCT 39.8 38.7* 35.3*  MCV 97.3 99.0 98.6  PLT 69* 59* 51*   Cardiac Enzymes:  Recent Labs Lab 12/29/13 0538  TROPONINI <0.30   BNP (last 3 results) No results found for this basename: PROBNP,  in the last 8760 hours CBG:  Recent Labs Lab 12/29/13 1531 12/29/13 1933 12/29/13 2335 12/30/13 0406 12/30/13 0748  GLUCAP 229* 280* 141* 120* 126*    Recent Results (from the past 240 hour(s))  CULTURE, BLOOD (ROUTINE X 2)     Status: None   Collection  Time    12/29/13  4:33 AM      Result Value Ref Range Status   Specimen Description BLOOD RIGHT HAND   Final   Special Requests BOTTLES DRAWN AEROBIC AND ANAEROBIC [facility] EACH   Final   Culture  Setup Time     Final   Value: 12/29/2013 14:37     Performed at [facility]   Culture     Final   Value:        BLOOD CULTURE RECEIVED NO GROWTH TO DATE CULTURE WILL BE HELD FOR 5 DAYS BEFORE ISSUING A FINAL NEGATIVE REPORT     Performed at [facility]   Report Status PENDING   Incomplete  CULTURE, BLOOD (ROUTINE X 2)     Status: None   Collection Time    12/29/13  4:41 AM      Result Value Ref Range Status   Specimen Description BLOOD RIGHT WRIST   Final   Special Requests BOTTLES DRAWN AEROBIC AND  ANAEROBIC [facility] EACH   Final   Culture  Setup Time     Final   Value: 12/29/2013 14:37     Performed at [facility]   Culture     Final   Value:        BLOOD CULTURE RECEIVED NO GROWTH TO DATE CULTURE WILL BE HELD FOR 5 DAYS BEFORE ISSUING A FINAL NEGATIVE REPORT     Performed at [facility]   Report Status PENDING   Incomplete  CLOSTRIDIUM DIFFICILE BY PCR     Status: None   Collection Time    12/29/13 10:40 AM      Result Value Ref Range Status   C difficile by pcr NEGATIVE  NEGATIVE Final     Studies: Dg Chest 2 View  12/28/2013   CLINICAL DATA:  Altered mental status. Shortness of breath and back pain.  EXAM: CHEST  2 VIEW  COMPARISON:  Chest radiograph performed 04/19/2013  FINDINGS: The lungs are hypoexpanded. Vascular crowding is noted. Minimal bilateral atelectasis is seen. There is no evidence of pleural effusion or pneumothorax.  The heart is borderline enlarged. No acute osseous abnormalities are seen.  IMPRESSION: Lungs hypoexpanded and difficult to fully assess. Minimal bilateral atelectasis noted. Borderline cardiomegaly.   Electronically Signed   By: Garald Balding M.D.   On: 12/28/2013 23:28   Ct Head Wo Contrast  12/29/2013   CLINICAL DATA:  Altered level of consciousness.  Frontal headache.  EXAM: CT HEAD WITHOUT CONTRAST  TECHNIQUE: Contiguous axial images were obtained from the base of the skull through the vertex without intravenous contrast.  COMPARISON:  CT of the head performed 07/03/2010  FINDINGS: There is no evidence of acute infarction, mass lesion, or intra- or extra-axial hemorrhage on CT.  Scattered subcortical white matter change likely reflects small vessel ischemic microangiopathy. A small chronic infarct is noted at the right occipital lobe.  The posterior fossa, including the cerebellum, brainstem and fourth ventricle, is within normal limits. The third and lateral ventricles, and basal ganglia are unremarkable in appearance. No mass effect or  midline shift is seen.  There is no evidence of fracture; visualized osseous structures are unremarkable in appearance. The orbits are within normal limits. Mild mucosal thickening is noted at the right side of the sphenoid sinus. The remaining paranasal sinuses and mastoid air cells are well-aerated. No significant soft tissue abnormalities are seen. Apparent focal soft tissue thickening along the right posterior side of the neck  is thought to reflect volume averaging.  IMPRESSION: 1. No acute intracranial pathology seen on CT. 2. Scattered small vessel ischemic microangiopathy. 3. Small chronic infarct at the right occipital lobe. 4. Mild mucosal thickening at the right side of the sphenoid sinus.   Electronically Signed   By: Garald Balding M.D.   On: 12/29/2013 01:36   Ct Abdomen Pelvis W Contrast  12/29/2013   CLINICAL DATA:  Frontal headache, left lower quadrant abdominal pain  EXAM: CT ABDOMEN AND PELVIS WITH CONTRAST  TECHNIQUE: Multidetector CT imaging of the abdomen and pelvis was performed using the standard protocol following bolus administration of intravenous contrast.  COMPARISON:  none  CONTRAST:  175m OMNIPAQUE IOHEXOL 300 MG/ML  SOLN  FINDINGS: Lung bases are clear.  No pericardial fluid.  The liver has a lobular contour consistent with cirrhosis. There are gallstones layering dependently in the gallbladder. No evidence of pancreatic inflammation. The spleen is enlarged. This extensive venous collaterals extending along the splenic hilum and along the esophagus. The left portal vein is patent. Right portal vein is diminutive. There is haziness the central mesentery.  Adrenal glands and kidneys are normal.  The stomach, small bowel cecum are normal.  There is thickening of the ascending colon with pericolonic inflammation. This extends into the transverse colon. The rectosigmoid colon appears normal.  Abdominal or is normal caliber. intimal calcification of the aorta.  There is a partially  occlusive thrombus within the superior mesenteric vein. There partially occlusive thrombus within the splenic vein which is partially occlusive. There is recanalization of the umbilical vein.  No free fluid the pelvis. The prostate gland and bladder normal. No pelvic lymphadenopathy. No aggressive osseous lesion.  IMPRESSION: 1. Haziness within the central mesentery which could relate to portal hypertension. 2. There is bowel wall thickening involving the ascending colon and mid transverse colon. This segmental bowel wall thickening/edema could represent ischemic change. Secondary differentials would include inflammatory bowel disease and infectious colitis or drug-induced colitis. The edema could represent passive congestion from the partial venous occlusion. 3. Cirrhosis and portal hypertension with multiple venous collaterals. 4. Partial thrombosis of the splenic vein and superior mesenteric vein.   Electronically Signed   By: SSuzy BouchardM.D.   On: 12/29/2013 01:45    Scheduled Meds: . ciprofloxacin  500 mg Oral BID  . insulin aspart  0-9 Units Subcutaneous 6 times per day  . insulin glargine  25 Units Subcutaneous QHS  . ipratropium  0.5 mg Nebulization Q6H  . lactulose  30 g Oral TID  . metroNIDAZOLE  500 mg Oral 3 times per day  . pantoprazole  40 mg Oral Q1200  . sodium chloride  3 mL Intravenous Q12H   Continuous Infusions:  Antibiotics Given (last 72 hours)   Date/Time Action Medication Dose Rate   12/29/13 0633 Given   piperacillin-tazobactam (ZOSYN) IVPB 3.375 g 3.375 g 12.5 mL/hr   12/29/13 1506 Given   piperacillin-tazobactam (ZOSYN) IVPB 3.375 g 3.375 g 12.5 mL/hr   12/29/13 2159 Given   piperacillin-tazobactam (ZOSYN) IVPB 3.375 g 3.375 g 12.5 mL/hr   12/30/13 0736 Given   piperacillin-tazobactam (ZOSYN) IVPB 3.375 g 3.375 g 12.5 mL/hr      Active Problems:   DIASTOLIC HEART FAILURE, CHRONIC   Cirrhosis of liver without mention of alcohol   Encephalopathy, hepatic    Colitis   Hepatic encephalopathy   Sepsis   Diabetes mellitus    Time spent: 332m    [facility] Pager  [phone]. If 7PM-7AM, please contact night-coverage at [email or web address], password [facility] 12/30/2013, 9:13 AM  LOS: 2 days

## 2013-12-30 NOTE — Consult Note (Signed)
[facility] Consult: 9:19 AM 12/30/2013  LOS: 2 days    Referring Provider: Dr Domenic Polite Primary Care Physician:  Antonietta Jewel, MD Primary Gastroenterologist:  Dr. Thomasenia Sales    Reason for Consultation:  Assist mgt of pt with cirrhosis.   HPI: Adam Butler is a 58 y.o. male.  Diastolic CHF (NYHA class II-III symptoms and volume overload a/w poor diet in 02/2011), type 2 DM, CAD with 2011 MI and BMS placement to CFX.  Plavix discontinued by cardiologist 01/2012.  Transfusion requiring anemia 06/2010.  Hx of cryptogennic (likely NASH) cirrhosis, non-bleeding esophageal varices and colonic portal hypertensive changes.  Hx hepatic vs metabollic encephalopathy and ascites in 2011.   EGD/colonoscopy in 04/2012 showed grade 4 esoph varices, duodenal ulcers, portal gastropathy; right sided portal hypertensive colopathy and rectal varices.  Has not returned for intended one month GI follow up.  Labetolol dose of 40 mg Rx'd at the time of procedures.  Pt has not been taking Lactulose, nor BB or PPI.  He never completed his Hep B/A vaccination series.   Admitted 6/20 with progressing somnolence, confusion, dyspnea, back and abdominal pain, diarrhea, nausea. Ammonia 139, down to 125 with Lactulose. T bili 1.4. Transaminases minimally elevated.  Initial Alk phos of 142 has normalized.  NA 135. No BUN/creat derangement.  CT abdomen with partial portal and splenic vein thrombosis (likely chronic), portal htn with collateralization, ascending/transverse colitis (?ischemic).  No mention of ascites.  CT head with microangiopathy, stable right infarct, sphenoid sinus thickening.  No evidence of lung infiltrates or volume overload.    Currently on Zosyn, Lactulose 30 ml TID,  Having 8 to 10 loose stools daily with this.   Norm is 2 formed BMs daily. Had right abdominal pain initially at presentation, this has improved.  No fever. No blood per rectum.    No dysphagia.  Weight generally in upward trend.  Does not follow heart or diabetic diet.  Sugars at home from 100 to 300.   Past nose bleeds have resolved.  No free bleeding.  No tatoos.  No blood in urine, no frequency.      Past Medical History  Diagnosis Date  . Esophageal varices 04/2012  . Cirrhosis 2011    Cryptogenic, Likely NASH. Family/pt deny EtOH. HCV, HBV, HAV negative. ANA negative. AMA positive. Ascites 12/11  . Hyperlipemia   . Coronary artery disease     Inferior MI 12/11; LHC with occluded mid CFX and 80% proximal RCA. EF 55%. He had 3.0 x 28 vision BMS to CFX  . Diastolic CHF, acute     Echo 12/11 with ef 50-55% and mild LVH. EF 55% by LV0gram in 12/11  . Hypertension   . Type II diabetes mellitus 2011  . SVT (supraventricular tachycardia)     1/12: appeared to be an ectopic atrial tachycardia. Required DCCV with hemodynamic instability  . GI bleed     12/11: Etiology not clearly defined. EGD: nonbleeding esophageal varices.  . S/P coronary artery stent placement 06/2010  . Esophageal varices 2011, 2013  no hx acute variceal bleed  . Hepatic encephalopathy 2011, 12/2013    Past Surgical History  Procedure Laterality Date  . Cardiac catheterization  07/01/2010    BMS to CFX.  Marland Kitchen Appendectomy    . Orif r leg    . Esophagogastroduodenoscopy  04/13/2012    Procedure: ESOPHAGOGASTRODUODENOSCOPY (EGD);  Surgeon: Inda Castle, MD;  Location: [facility];  Service: Endoscopy;  Laterality: N/A;  . Colonoscopy  04/13/2012    Procedure: COLONOSCOPY;  Surgeon: Inda Castle, MD;  Location: [facility];  Service: Endoscopy;  Laterality: N/A;    Prior to Admission medications   Medication Sig Start Date End Date Taking? Authorizing Provider  aspirin 81 MG tablet Take 81 mg by mouth daily.    Yes Historical Provider, MD  atenolol  (TENORMIN) 50 MG tablet Take 50 mg by mouth daily.   Yes Historical Provider, MD  enalapril (VASOTEC) 20 MG tablet Take 20 mg by mouth daily.   Yes Historical Provider, MD  furosemide (LASIX) 40 MG tablet Take 20 mg by mouth daily. 01/09/12  Yes Larey Dresser, MD  insulin NPH Human (HUMULIN N,NOVOLIN N) 100 UNIT/ML injection Inject 20-80 Units into the skin See admin instructions. Take 80 units in the morning and 50 units in the evening.   Yes Historical Provider, MD  metFORMIN (GLUCOPHAGE) 500 MG tablet Take 1,000 mg by mouth 2 (two) times daily.    Yes Historical Provider, MD  Multiple Vitamin (MULTIVITAMIN) capsule Take 1 capsule by mouth daily.     Yes Historical Provider, MD  nitroGLYCERIN (NITROSTAT) 0.4 MG SL tablet Place 1 tablet (0.4 mg total) under the tongue every 5 (five) minutes as needed. 01/09/12  Yes Larey Dresser, MD    Scheduled Meds: . ciprofloxacin  500 mg Oral BID  . insulin aspart  0-9 Units Subcutaneous 6 times per day  . insulin glargine  25 Units Subcutaneous QHS  . ipratropium  0.5 mg Nebulization Q6H  . lactulose  20 g Oral Daily  . metroNIDAZOLE  500 mg Oral 3 times per day  . pantoprazole  40 mg Oral Q1200  . sodium chloride  3 mL Intravenous Q12H   Infusions:   PRN Meds: acetaminophen, acetaminophen, albuterol, albuterol, HYDROmorphone (DILAUDID) injection, ondansetron (ZOFRAN) IV, ondansetron   Allergies as of 12/28/2013 - Review Complete 12/28/2013  Allergen Reaction Noted  . Sulfa antibiotics  04/13/2012    Family History  Problem Relation Age of Onset  . Heart attack Brother 75    MI  . Heart attack Brother 39    MI  . Heart attack Father   . Diabetes Father   . Diabetes Brother   . COPD Mother     History   Social History  . Marital Status: Married    Spouse Name: N/A    Number of Children: 3  . Years of Education: N/A   Occupational History  . Unemployed Other    Worked in maintenance prior   Social History Main Topics  .  Smoking status: Current Some Day Smoker -- 1.00 packs/day for 36 years    Types: Cigarettes  . Smokeless tobacco: Never Used  . Alcohol Use: No  . Drug Use: No  . Sexual Activity: Not on file   Other Topics Concern  . Not on file   Social History Narrative   Married   Gets regular exercise: walking    REVIEW OF SYSTEMS: See HPI for results of 12 system review.  PHYSICAL EXAM: Vital signs in last 24 hours: Filed Vitals:   12/30/13 0724  BP: 105/44  Pulse: 74  Temp: 98.5 F (36.9 C)  Resp: 25   Wt Readings from Last 3 Encounters:  12/29/13 132.6 kg (292 lb 5.3 oz)  04/10/12 123.605 kg (272 lb 8 oz)  01/09/12 124.739 kg (275 lb)    General: morbidly obese wm, appears sluggish/tired but comfortable. Looks generally unwell Head:  No asymmetry  Eyes:  No icterus or pallor Ears:  Not HOH  Nose:  No congestion Mouth:  Clear,  Moist oral MM.  Poor dentition. Neck:  No mass, no TMG, no bruit Lungs:  Clear bil.  No cough or dyspnea Heart: RRR.  No MRG Abdomen:  Soft, obese, minor right side tenderness, active BS.  No orgnaomegaly appreciated, no bruits.   Rectal: deferred   Musc/Skeltl: no joint deformity or erythema Extremities:  + pedal, LE edema: not pitting  Neurologic:  Oriented x 3.  No tremor.  No limb weakness Endocrine:  Gynecomastia.  Skin:  Some  Telangectasia on upper trunk.  Tattoos:  none Nodes:  No cervical adenopathy.    Psych:  Cooperative, oriented x 3.  Maintains arousal easily.  Fluid speech.   Intake/Output from previous day: 06/21 0701 - 06/22 0700 In: 100 [IV Piggyback:100] Out: 1025 [Urine:1025] Intake/Output this shift: Total I/O In: 50 [IV Piggyback:50] Out: 150 [Urine:150]  LAB RESULTS:  Recent Labs  12/28/13 2236 12/29/13 0600 12/30/13 0248  WBC 9.7 12.9* 7.2  HGB 14.1 13.1 12.0*  HCT 39.8 38.7* 35.3*  PLT 69* 59* 51*   BMET Lab Results  Component Value Date   NA 135* 12/30/2013   NA 141 12/29/2013   NA 137 12/28/2013   K  3.6* 12/30/2013   K 4.9 12/29/2013   K 4.0 12/28/2013   CL 102 12/30/2013   CL 105 12/29/2013   CL 100 12/28/2013   CO2 23 12/30/2013   CO2 21 12/29/2013   CO2 20 12/28/2013   GLUCOSE 126* 12/30/2013   GLUCOSE 108* 12/29/2013   GLUCOSE 174* 12/28/2013   BUN 13 12/30/2013   BUN 14 12/29/2013   BUN 13 12/28/2013   CREATININE 0.78 12/30/2013   CREATININE 0.85 12/29/2013   CREATININE 0.72 12/28/2013   CALCIUM 8.7 12/30/2013   CALCIUM 8.8 12/29/2013   CALCIUM 9.2 12/28/2013   LFT  Recent Labs  12/28/13 2236 12/29/13 0600 12/30/13 0248  PROT 6.6 6.1 5.8*  ALBUMIN 2.9* 2.7* 2.5*  AST 69* 63* 54*  ALT 51 50 45  ALKPHOS 142* 95 63  BILITOT 1.4* 2.4* 1.8*  BILIDIR 0.4*  --   --   IBILI 1.0*  --   --    PT/INR Lab Results  Component Value Date   INR 1.20 12/29/2013        PT                         14.9 Lipase     Component Value Date/Time   LIPASE 29 07/01/2010 1000      RADIOLOGY STUDIES: Dg Chest 2 View  12/28/2013   CLINICAL DATA:  Altered mental status. Shortness of breath and back pain.  EXAM: CHEST  2 VIEW  COMPARISON:  Chest radiograph performed 04/19/2013  FINDINGS: The lungs are hypoexpanded. Vascular crowding is noted. Minimal bilateral atelectasis is seen. There is no evidence of pleural effusion or pneumothorax.  The heart is borderline enlarged. No acute osseous abnormalities  are seen.  IMPRESSION: Lungs hypoexpanded and difficult to fully assess. Minimal bilateral atelectasis noted. Borderline cardiomegaly.   Electronically Signed   By: Garald Balding M.D.   On: 12/28/2013 23:28   Ct Head Wo Contrast 12/29/2013     COMPARISON:  CT of the head performed 07/03/2010  FINDINGS: There is no evidence of acute infarction, mass lesion, or intra- or extra-axial hemorrhage on CT.  Scattered subcortical white matter change likely reflects small vessel ischemic microangiopathy. A small chronic infarct is noted at the right occipital lobe.  The posterior fossa, including the cerebellum,  brainstem and fourth ventricle, is within normal limits. The third and lateral ventricles, and basal ganglia are unremarkable in appearance. No mass effect or midline shift is seen.  There is no evidence of fracture; visualized osseous structures are unremarkable in appearance. The orbits are within normal limits. Mild mucosal thickening is noted at the right side of the sphenoid sinus. The remaining paranasal sinuses and mastoid air cells are well-aerated. No significant soft tissue abnormalities are seen. Apparent focal soft tissue thickening along the right posterior side of the neck is thought to reflect volume averaging.  IMPRESSION: 1. No acute intracranial pathology seen on CT. 2. Scattered small vessel ischemic microangiopathy. 3. Small chronic infarct at the right occipital lobe. 4. Mild mucosal thickening at the right side of the sphenoid sinus.   Electronically Signed   By: Garald Balding M.D.   On: 12/29/2013 01:36   Ct Abdomen Pelvis W Contrast 12/29/2013   COMPARISON:  none  CONTRAST:  111m OMNIPAQUE IOHEXOL 300 MG/ML  SOLN  FINDINGS: Lung bases are clear.  No pericardial fluid.  The liver has a lobular contour consistent with cirrhosis. There are gallstones layering dependently in the gallbladder. No evidence of pancreatic inflammation. The spleen is enlarged. This extensive venous collaterals extending along the splenic hilum and along the esophagus. The left portal vein is patent. Right portal vein is diminutive. There is haziness the central mesentery.  Adrenal glands and kidneys are normal.  The stomach, small bowel cecum are normal.  There is thickening of the ascending colon with pericolonic inflammation. This extends into the transverse colon. The rectosigmoid colon appears normal.  Abdominal or is normal caliber. intimal calcification of the aorta.  There is a partially occlusive thrombus within the superior mesenteric vein. There partially occlusive thrombus within the splenic vein which  is partially occlusive. There is recanalization of the umbilical vein.  No free fluid the pelvis. The prostate gland and bladder normal. No pelvic lymphadenopathy. No aggressive osseous lesion.  IMPRESSION: 1. Haziness within the central mesentery which could relate to portal hypertension. 2. There is bowel wall thickening involving the ascending colon and mid transverse colon. This segmental bowel wall thickening/edema could represent ischemic change. Secondary differentials would include inflammatory bowel disease and infectious colitis or drug-induced colitis. The edema could represent passive congestion from the partial venous occlusion. 3. Cirrhosis and portal hypertension with multiple venous collaterals. 4. Partial thrombosis of the splenic vein and superior mesenteric vein.   Electronically Signed   By: SSuzy BouchardM.D.   On: 12/29/2013 01:45    ENDOSCOPIC STUDIES: 04/2012  Colonoscopy For FOBT + stool.  1. portal hypertensive colopathy  2. rectal varices  3. friable colonic mucosa  04/2012  EGD For variceal surveillance.  1. Grade 4 varices - status post band ligation  2. duodenal ulcers and erosions  3. portal hypertensive gastropathy Plan: increase nadolol to 40 mg daily followup endoscopy  in 2-3 weeks  06/2010  EGD  Dr Olevia Perches.  For anemia, melenic stools 1) Grade II varices in the distal esophagus  2) Gastropathy  3) Otherwise normal examination  no active or recent bleeding from UGI tract     IMPRESSION:   *  Cryptogennic cirrhosis. Hx portal htn affecting stomach and colon.  Grade 4 esoph varices in 2013. Multiple venous collaterals and recannalized umbilical vein noted.   *  Partial splenic vein and SMV thrombosis.  Chronic.   *  Ascending/transverse colonic thickening.  Portal hypertensive colitis noted in 2013. On empiric  Cipro and Flagyl.   *  Hepatic encephalopathy, acute.  Hx same in 2011.  Ammonia and AMS improved with Lactulose.   *   Thrombocytopenia. Dates to at least 12/2011but now worse than in past.   *  2013 duodenal ulcers.  PPI discontinued by PMD.  Suspect he needs this chronically and it has been initiated.   *  CAD, MI with BMS placement 2011.   *  Type 2 DM, oral agent and Insulin in use at home. Not compliant with diet, requiring larger doses of insulin at home.   *  Morbid obesity.     PLAN:     *  Titrate lactulose to 3 or 4 BMs daily.  *  Not sure he needs anticoagulation for the thrombosis as these are chronic.   *  ? Need for colonoscopy.  Overdue for variceal surveillance. Dr Hilarie Fredrickson to assess.   *  Not convinced he needs the abx as suspect colon changes are not infectious in nature.      Azucena Freed  12/30/2013, 9:19 AM Pager: [phone]

## 2013-12-30 NOTE — Consult Note (Signed)
Patient seen, examined, and I agree with the above documentation, including the assessment and plan. Patient with history of cryptogenic, probably NASH, cirrhosis with portal hypertension manifested by history of esophageal varices, portal hypertensive gastropathy, rectal varices, thrombocytopenia admitted with concern for sepsis, imaging revealing right-sided colitis with hepatic encephalopathy Previously lost to GI/liver follow-up Encephalopathy has improved significantly, he is having diarrhea now in the setting of lactulose. Rifaximin twice daily would likely be a better option, question if he can afford?  If not, then would titrate lactulose to 3-4 bowel movements daily Right-sided colitis, unclear possibly infectious. Dr. Deatra Ina felt that he had portal hypertensive colopathy in the past, but one would not expect this to cause colitis that would be seen by CT. Certainly bowel thickening can be seen in the setting of portal hypertension. 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

## 2013-12-30 NOTE — Evaluation (Signed)
Physical Therapy Evaluation and Discharge Patient Details Name: RADWAN COWLEY MRN: [id number] DOB: 1956-03-18 Today's Date: 12/30/2013   History of Present Illness  Pt is a 58 y/o male with a hx of cryptogenic cirrhosis with poor follow up, admitted with colitis and possible sepsis in the setting of liver failure and Hepatic encephalopathy  Clinical Impression  Patient evaluated by Physical Therapy with no further acute PT needs identified. All education has been completed and the patient has no further questions. At the time of PT eval pt was able to ambulate 500 feet independently. See below for any follow-up Physial Therapy or equipment needs. PT is signing off. Thank you for this referral.     Follow Up Recommendations No PT follow up    Equipment Recommendations  None recommended by PT    Recommendations for Other Services       Precautions / Restrictions Precautions Precautions: Fall Restrictions Weight Bearing Restrictions: No      Mobility  Bed Mobility               General bed mobility comments: Pt received OOB ambulating around room  Transfers Overall transfer level: Independent Equipment used: None             General transfer comment: No physical assist required.   Ambulation/Gait Ambulation/Gait assistance: Independent Ambulation Distance (Feet): 500 Feet Assistive device: None Gait Pattern/deviations: WFL(Within Functional Limits)   Gait velocity interpretation: at or above normal speed for age/gender    Stairs            Wheelchair Mobility    Modified Rankin (Stroke Patients Only)       Balance Overall balance assessment: Modified Independent                                           Pertinent Vitals/Pain After 500' ambulation pt somewhat SOB. O2 sats at 92% on RA.     Home Living Family/patient expects to be discharged to:: Private residence Living Arrangements: Spouse/significant  other Available Help at Discharge: Family;Available PRN/intermittently Type of Home: House Home Access: Stairs to enter Entrance Stairs-Rails: Can reach both;Right;Left Entrance Stairs-Number of Steps: 3 Home Layout: One level Home Equipment: Walker - 2 wheels;Cane - single point      Prior Function Level of Independence: Independent with assistive device(s)         Comments: Used cane for long distances or going to the store     Hand Dominance   Dominant Hand: Right    Extremity/Trunk Assessment   Upper Extremity Assessment: Overall WFL for tasks assessed           Lower Extremity Assessment: Overall WFL for tasks assessed      Cervical / Trunk Assessment: Normal  Communication   Communication: No difficulties  Cognition Arousal/Alertness: Awake/alert Behavior During Therapy: WFL for tasks assessed/performed Overall Cognitive Status: Within Functional Limits for tasks assessed                      General Comments      Exercises        Assessment/Plan    PT Assessment Patent does not need any further PT services  PT Diagnosis     PT Problem List    PT Treatment Interventions     PT Goals (Current goals can be found in the Care Plan section)  Acute Rehab PT Goals PT Goal Formulation: No goals set, d/c therapy    Frequency     Barriers to discharge        Co-evaluation               End of Session Equipment Utilized During Treatment: Gait belt Activity Tolerance: Patient tolerated treatment well Patient left: in chair;with call bell/phone within reach Nurse Communication: Mobility status         Time: 3299-2426 PT Time Calculation (min): 11 min   Charges:   PT Evaluation $Initial PT Evaluation Tier I: 1 Procedure     PT G CodesJolyn Lent 12/30/2013, 5:09 PM  Jolyn Lent, PT, DPT Acute Rehabilitation Services Pager: [phone]

## 2013-12-31 LAB — CBC
HEMATOCRIT: 34.9 % — AB (ref 39.0–52.0)
HEMOGLOBIN: 11.7 g/dL — AB (ref 13.0–17.0)
MCH: 33.1 pg (ref 26.0–34.0)
MCHC: 33.5 g/dL (ref 30.0–36.0)
MCV: 98.6 fL (ref 78.0–100.0)
Platelets: 53 10*3/uL — ABNORMAL LOW (ref 150–400)
RBC: 3.54 MIL/uL — AB (ref 4.22–5.81)
RDW: 13.5 % (ref 11.5–15.5)
WBC: 4.3 10*3/uL (ref 4.0–10.5)

## 2013-12-31 LAB — GLUCOSE, CAPILLARY
GLUCOSE-CAPILLARY: 207 mg/dL — AB (ref 70–99)
GLUCOSE-CAPILLARY: 203 mg/dL — AB (ref 70–99)
GLUCOSE-CAPILLARY: 236 mg/dL — AB (ref 70–99)
Glucose-Capillary: 214 mg/dL — ABNORMAL HIGH (ref 70–99)

## 2013-12-31 LAB — COMPREHENSIVE METABOLIC PANEL
ALK PHOS: 66 U/L (ref 39–117)
ALT: 43 U/L (ref 0–53)
AST: 58 U/L — ABNORMAL HIGH (ref 0–37)
Albumin: 2.4 g/dL — ABNORMAL LOW (ref 3.5–5.2)
BILIRUBIN TOTAL: 1.1 mg/dL (ref 0.3–1.2)
BUN: 9 mg/dL (ref 6–23)
CHLORIDE: 102 meq/L (ref 96–112)
CO2: 22 mEq/L (ref 19–32)
Calcium: 8.7 mg/dL (ref 8.4–10.5)
Creatinine, Ser: 0.77 mg/dL (ref 0.50–1.35)
GFR calc non Af Amer: >90 mL/min (ref >90)
Glucose, Bld: 156 mg/dL — ABNORMAL HIGH (ref 70–99)
POTASSIUM: 3.9 meq/L (ref 3.7–5.3)
Sodium: 138 mEq/L (ref 137–147)
Total Protein: 6 g/dL (ref 6.0–8.3)

## 2013-12-31 LAB — AMMONIA: AMMONIA: 53 umol/L (ref 11–60)

## 2013-12-31 LAB — PROCALCITONIN: Procalcitonin: 4.01 ng/mL

## 2013-12-31 MED ORDER — ASPIRIN 81 MG PO CHEW
81.0000 mg | CHEWABLE_TABLET | Freq: Every day | ORAL | Status: DC
  Administered 2013-12-31 – 2014-01-01 (×2): 81 mg via ORAL
  Filled 2013-12-31 (×3): qty 1

## 2013-12-31 NOTE — Care Management Note (Signed)
    Page 1 of 1   01/01/2014     6:19:49 PM CARE MANAGEMENT NOTE 01/01/2014  Patient:  Adam Butler, Adam Butler   Account Number:  [id number]  Date Initiated:  12/31/2013  Documentation initiated by:  Tomi Bamberger  Subjective/Objective Assessment:   dx hepatic encephalopathy  admit- lives with spouse.     Action/Plan:   EGD 6/24  pt eval- no pt f/u.   Anticipated DC Date:  01/01/2014   Anticipated DC Plan:  [place]  CM consult      Choice offered to / List presented to:             Status of service:  Completed, signed off Medicare Important Message given?  YES (If response is "NO", the following Medicare IM given date fields will be blank) Date Medicare IM given:  12/31/2013 Date Additional Medicare IM given:    Discharge Disposition:  HOME/SELF CARE  Per UR Regulation:  Reviewed for med. necessity/level of care/duration of stay  If discussed at [place] of Stay Meetings, dates discussed:    Comments:  12/31/13 [place], BSN [phone] patient lives with spouse, per physical therapy no pt f/u needed. NO needs anticipated.

## 2013-12-31 NOTE — Progress Notes (Signed)
Patient seen, examined, and I agree with the above documentation, including the assessment and plan. Plan for EGD tomorrow with banding if necessary based on size of esophageal varices Agree with 7 days of oral abx for infecious colitis MS has cleared, continue rifaximin EGD - The nature of the procedure, as well as the risks, benefits, and alternatives were carefully and thoroughly reviewed with the patient. Ample time for discussion and questions allowed. The patient understood, was satisfied, and agreed to proceed.

## 2013-12-31 NOTE — Progress Notes (Addendum)
Pt express desire to talk to someone about his DNR code status either tonight or in the morning. Pt states his wife is concerned about the DNR, but the patient does not want to be "kept alive by machines." On call physician notified. Changed to full code per pt request - attending physician will speak with pt in depth about code status in the AM.

## 2013-12-31 NOTE — Progress Notes (Addendum)
[facility] PROGRESS NOTE  Adam Butler TSV:[id number] DOB: [birth year] DOA: 12/28/2013 PCP: Antonietta Jewel, MD 58 yo M with hx of CAD, Cirrhosis likely from NASH was lost to GI follow up,  Admitted with Abd pain/ colitis and possible sepsis in the setting of liver failure and Hepatic encephalopathy.  Improving on IVF, Abx, lactulose. GI consulting, plan for EGD 6/24  Assessment/Plan: . 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 Cirrhosis of liver  -likely due to NASH -workup for other causes of cirrhosis negative in 2012 -h/o Varices too, plan for EGD in am  . Diastolic CHF, chronic compensated, hold lasix   . Encephalopathy, hepatic  -improved,had 10-12BMs yesterday, -will hold lactulose today and keep on Rifaximin  Thrombocytopenia -due to cirrhosis  DM -stable,  -continue SSI and lantus 25 units qhs   CAD -h/o BMS to circumflex in 2011 -resume ASA, off statins per PCP due to liver ds  Prophylaxis: SCD, Protonix   CODE STATUS:Full Code now, apparently convinced by his wife Family Communication: d/w wife at bedside 6/21 Disposition Plan: home pending GI workup Ambulate   Consultants:   HPI/Subjective: Feels better, 10-12 Bms with lactulose yest, abd pain much better, eating well  Objective: Filed Vitals:   12/31/13 0533  BP: 120/69  Pulse: 73  Temp: 97.7 F (36.5 C)  Resp: 18    Intake/Output Summary (Last 24 hours) at 12/31/13 1017 Last data filed at 12/31/13 0527  Gross per 24 hour  Intake    444 ml  Output    200 ml  Net    244 ml   Filed Weights   12/28/13 2223 12/29/13 0700  Weight: 127.007 kg (280 lb) 132.6 kg (292 lb 5.3 oz)     Exam:   General:  AAOx to self, place, no distress, mentation much imporved  Cardiovascular: S1S2/RRR  Respiratory: diminished BS at bases  Abdomen: soft, much less Tender in RLQ and LLQ, BS present but diminished  Musculoskeletal: trace edema c/c  Neuro: no asterixes, non focal  Data Reviewed: Basic Metabolic Panel:  Recent Labs Lab 12/28/13 2236 12/29/13 0600 12/30/13 0248 12/31/13 0343  NA 137 141 135* 138  K 4.0 4.9 3.6* 3.9  CL 100 105 102 102  CO2 20 21 23 22   GLUCOSE 174* 108* 126* 156*  BUN 13 14 13 9   CREATININE 0.72 0.85 0.78 0.77  CALCIUM 9.2 8.8 8.7 8.7  MG  --  1.3*  --   --   PHOS  --  4.3  --   --    Liver Function Tests:  Recent Labs Lab 12/28/13 2236 12/29/13 0600 12/30/13 0248 12/31/13 0343  AST 69* 63* 54* 58*  ALT 51 50 45 43  ALKPHOS 142* 95 63 66  BILITOT 1.4* 2.4* 1.8* 1.1  PROT 6.6 6.1 5.8* 6.0  ALBUMIN 2.9* 2.7* 2.5* 2.4*   No results found for this basename: LIPASE, AMYLASE,  in the last 168 hours  Recent Labs Lab 12/29/13 0015 12/29/13 0600 12/31/13 0343  AMMONIA 139* 125* 53   CBC:  Recent Labs Lab 12/28/13 2236 12/29/13 0600 12/30/13 0248 12/31/13 0343  WBC 9.7 12.9* 7.2 4.3  NEUTROABS  --  10.2*  --   --  HGB 14.1 13.1 12.0* 11.7*  HCT 39.8 38.7* 35.3* 34.9*  MCV 97.3 99.0 98.6 98.6  PLT 69* 59* 51* 53*   Cardiac Enzymes:  Recent Labs Lab 12/29/13 0538  TROPONINI <0.30   BNP (last 3 results) No results found for this basename: PROBNP,  in the last 8760 hours CBG:  Recent Labs Lab 12/30/13 0748 12/30/13 1241 12/30/13 1754 12/30/13 2156 12/31/13 0802  GLUCAP 126* 225* 131* 192* 207*    Recent Results (from the past 240 hour(s))  CULTURE, BLOOD (ROUTINE X 2)     Status: None   Collection Time    12/29/13  4:33 AM      Result Value Ref Range Status   Specimen Description BLOOD RIGHT HAND   Final   Special Requests BOTTLES DRAWN AEROBIC AND ANAEROBIC [facility] EACH   Final   Culture  Setup  Time     Final   Value: 12/29/2013 14:37     Performed at [facility]   Culture     Final   Value:        BLOOD CULTURE RECEIVED NO GROWTH TO DATE CULTURE WILL BE HELD FOR 5 DAYS BEFORE ISSUING A FINAL NEGATIVE REPORT     Performed at [facility]   Report Status PENDING   Incomplete  CULTURE, BLOOD (ROUTINE X 2)     Status: None   Collection Time    12/29/13  4:41 AM      Result Value Ref Range Status   Specimen Description BLOOD RIGHT WRIST   Final   Special Requests BOTTLES DRAWN AEROBIC AND ANAEROBIC [facility] EACH   Final   Culture  Setup Time     Final   Value: 12/29/2013 14:37     Performed at [facility]   Culture     Final   Value:        BLOOD CULTURE RECEIVED NO GROWTH TO DATE CULTURE WILL BE HELD FOR 5 DAYS BEFORE ISSUING A FINAL NEGATIVE REPORT     Performed at [facility]   Report Status PENDING   Incomplete  CLOSTRIDIUM DIFFICILE BY PCR     Status: None   Collection Time    12/29/13 10:40 AM      Result Value Ref Range Status   C difficile by pcr NEGATIVE  NEGATIVE Final     Studies: No results found.  Scheduled Meds: . ciprofloxacin  500 mg Oral BID  . insulin aspart  0-9 Units Subcutaneous TID WC  . insulin glargine  25 Units Subcutaneous QHS  . lactulose  20 g Oral Daily  . metroNIDAZOLE  500 mg Oral 3 times per day  . pantoprazole  40 mg Oral Q1200  . rifaximin  550 mg Oral BID  . sodium chloride  3 mL Intravenous Q12H   Continuous Infusions:  Antibiotics Given (last 72 hours)   Date/Time Action Medication Dose Rate   12/29/13 0633 Given   piperacillin-tazobactam (ZOSYN) IVPB 3.375 g 3.375 g 12.5 mL/hr   12/29/13 1506 Given   piperacillin-tazobactam (ZOSYN) IVPB 3.375 g 3.375 g 12.5 mL/hr   12/29/13 2159 Given   piperacillin-tazobactam (ZOSYN) IVPB 3.375 g 3.375 g 12.5 mL/hr   12/30/13 0736 Given   piperacillin-tazobactam (ZOSYN) IVPB 3.375 g 3.375 g 12.5 mL/hr   12/30/13 1036 Given   ciprofloxacin (CIPRO) tablet 500  mg 500 mg    12/30/13 1036 Given   metroNIDAZOLE (FLAGYL) tablet 500 mg 500 mg    12/30/13 2019  Given   ciprofloxacin (CIPRO) tablet 500 mg 500 mg    12/30/13 2157 Given   metroNIDAZOLE (FLAGYL) tablet 500 mg 500 mg    12/30/13 2300 Given   rifaximin (XIFAXAN) tablet 550 mg 550 mg    12/31/13 0526 Given   metroNIDAZOLE (FLAGYL) tablet 500 mg 500 mg    12/31/13 1638 Given   ciprofloxacin (CIPRO) tablet 500 mg 500 mg    12/31/13 4665 Given   rifaximin (XIFAXAN) tablet 550 mg 550 mg       Active Problems:   DIASTOLIC HEART FAILURE, CHRONIC   Cirrhosis of liver without mention of alcohol   Encephalopathy, hepatic   Colitis   Hepatic encephalopathy   Sepsis   Diabetes mellitus    Time spent: 36mn    JOSEPH,PREETHA  [facility] Pager [phone] If 7PM-7AM, please contact night-coverage at [email or web address], password [email or web address]6/23/2015, 10:17 AM  LOS: 3 days

## 2013-12-31 NOTE — Progress Notes (Signed)
Inpatient Diabetes Program Recommendations  AACE/ADA: New Consensus Statement on Inpatient Glycemic Control (2013)  Target Ranges:  Prepandial:   less than 140 mg/dL      Peak postprandial:   less than 180 mg/dL (1-2 hours)      Critically ill patients:  140 - 180 mg/dL     Results for [name], [name] (MRN [id number]) as of 12/31/2013 13:25  Ref. Range 12/31/2013 08:02 12/31/2013 11:47  Glucose-Capillary Latest Range: 70-99 mg/dL 207 (H) 214 (H)     Home DM Meds:  NPH insulin 80 units AM/ 50 units PM Metformin 1000 mg bid   **Patient currently getting Lantus 25 units QHS + Novolog Sensitive SSI    MD- Please increase Lantus to 30 units QHS    Will follow Wyn Quaker RN, MSN, CDE Diabetes Coordinator Inpatient Diabetes Program Team Pager: [phone] (8a-10p)

## 2013-12-31 NOTE — Progress Notes (Signed)
Daily Rounding Note  12/31/2013, 9:15 AM  LOS: 3 days   SUBJECTIVE:       Small, loose stools. Significantly improved abdominal pain. Up walking.  Not confused.  Feels well.  Asking about target discharge date.   OBJECTIVE:         Vital signs in last 24 hours:    Temp:  [97.7 F (36.5 C)-97.8 F (36.6 C)] 97.7 F (36.5 C) (06/23 0533) Pulse Rate:  [73-87] 73 (06/23 0533) Resp:  [14-20] 18 (06/23 0533) BP: (120-144)/(67-73) 120/69 mmHg (06/23 0533) SpO2:  [95 %-96 %] 95 % (06/23 0533) Last BM Date: 12/30/13 General: obese, looks unwell but not acutely ill   Heart: RRR.  No MRG Chest: clear bil.  No dyspnea or cough Abdomen: protuberant, slightly tense, active BS.  Minimal right sided tenderness  Extremities: non-pitting pedal/LE edema Neuro/Psych:  Pleasant,  Relaxed.  Oriented x 3.  Fluid speech, no asterixis.   Intake/Output from previous day: 06/22 0701 - 06/23 0700 In: 494 [P.O.:444; IV Piggyback:50] Out: 350 [Urine:350]  Intake/Output this shift:    Lab Results:  Recent Labs  12/29/13 0600 12/30/13 0248 12/31/13 0343  WBC 12.9* 7.2 4.3  HGB 13.1 12.0* 11.7*  HCT 38.7* 35.3* 34.9*  PLT 59* 51* 53*   BMET  Recent Labs  12/29/13 0600 12/30/13 0248 12/31/13 0343  NA 141 135* 138  K 4.9 3.6* 3.9  CL 105 102 102  CO2 21 23 22   GLUCOSE 108* 126* 156*  BUN 14 13 9   CREATININE 0.85 0.78 0.77  CALCIUM 8.8 8.7 8.7   LFT  Recent Labs  12/28/13 2236 12/29/13 0600 12/30/13 0248 12/31/13 0343  PROT 6.6 6.1 5.8* 6.0  ALBUMIN 2.9* 2.7* 2.5* 2.4*  AST 69* 63* 54* 58*  ALT 51 50 45 43  ALKPHOS 142* 95 63 66  BILITOT 1.4* 2.4* 1.8* 1.1  BILIDIR 0.4*  --   --   --   IBILI 1.0*  --   --   --      ASSESMENT:   * Cryptogennic cirrhosis.  Hx portal htn affecting stomach and colon. Grade 4 esoph varices in 2013. On current CT multiple venous collaterals and recannalized umbilical vein noted.    * Partial splenic vein and SMV thrombosis. Discussed features with Dr Leonia Reeves (radiologist) , as the thrombus is wall adherent and shows calcification, this is likely chronic rather than acute issue.  * Ascending/transverse colonic thickening. Portal hypertensive colitis noted in 2013. ? Infectious: oral Cipro and Flagyl day 2 * Hepatic encephalopathy, acute. Now resolved. Hx same in 2011.  Ammonia and AMS improved with Lactulose; Rifaximin added 6/22.  * 04/2012 banding of grade 4 esoph varices, never returned for planned follow up.  * Thrombocytopenia. Dates to at least 12/2011but now worse than in past. Splenomegaly noted on CT scan.  PT/INR normal.  * 2013 duodenal ulcers. PPI discontinued by PMD. Suspect he needs this chronically and it has been initiated.  * CAD, MI with BMS placement 2011.  * Type 2 DM, oral agent and Insulin in use at home. Not compliant with diet, requiring larger doses of insulin at home.  * Morbid obesity.    PLAN   *  EGD +/- variceal banding tomorrow 11 AM with MAC sedation.  . Do not think he needs concurrent colonoscopy.  *  Finish 7 days abx.     Azucena Freed  12/31/2013, 9:15 AM Pager: [phone]

## 2014-01-01 ENCOUNTER — Encounter (HOSPITAL_COMMUNITY): Payer: Medicare Other | Admitting: Anesthesiology

## 2014-01-01 ENCOUNTER — Encounter (HOSPITAL_COMMUNITY)
Admission: EM | Disposition: A | Discharge status: Home / Self Care | Payer: Self-pay | Source: Home / Self Care | Attending: Internal Medicine

## 2014-01-01 ENCOUNTER — Encounter (HOSPITAL_COMMUNITY): Payer: Self-pay | Admitting: Anesthesiology

## 2014-01-01 ENCOUNTER — Inpatient Hospital Stay (HOSPITAL_COMMUNITY): Payer: Medicare Other | Admitting: Anesthesiology

## 2014-01-01 HISTORY — PX: ESOPHAGEAL BANDING: SHX5518

## 2014-01-01 HISTORY — PX: ESOPHAGOGASTRODUODENOSCOPY: SHX5428

## 2014-01-01 LAB — GLUCOSE, CAPILLARY
GLUCOSE-CAPILLARY: 131 mg/dL — AB (ref 70–99)
Glucose-Capillary: 135 mg/dL — ABNORMAL HIGH (ref 70–99)

## 2014-01-01 SURGERY — EGD (ESOPHAGOGASTRODUODENOSCOPY)
Anesthesia: Monitor Anesthesia Care

## 2014-01-01 MED ORDER — OMEPRAZOLE 40 MG PO CPDR: 40.0000 mg | DELAYED_RELEASE_CAPSULE | Freq: Every day | ORAL | Status: DC

## 2014-01-01 MED ORDER — RIFAXIMIN 550 MG PO TABS: 550.0000 mg | ORAL_TABLET | Freq: Two times a day (BID) | ORAL | Status: DC

## 2014-01-01 MED ORDER — BUTAMBEN-TETRACAINE-BENZOCAINE 2-2-14 % EX AERO
INHALATION_SPRAY | CUTANEOUS | Status: DC | PRN
  Administered 2014-01-01: 1 via TOPICAL

## 2014-01-01 MED ORDER — LACTATED RINGERS IV SOLN
INTRAVENOUS | Status: DC | PRN
  Administered 2014-01-01: 10:00:00 via INTRAVENOUS

## 2014-01-01 MED ORDER — METRONIDAZOLE 500 MG PO TABS: 500.0000 mg | ORAL_TABLET | Freq: Three times a day (TID) | ORAL | Status: DC

## 2014-01-01 MED ORDER — CIPROFLOXACIN HCL 500 MG PO TABS: 500.0000 mg | ORAL_TABLET | Freq: Two times a day (BID) | ORAL | Status: DC

## 2014-01-01 MED ORDER — DEXMEDETOMIDINE HCL IN NACL 200 MCG/50ML IV SOLN
INTRAVENOUS | Status: DC | PRN
  Administered 2014-01-01: 0.4 ug/kg/h via INTRAVENOUS

## 2014-01-01 MED ORDER — FENTANYL CITRATE 0.05 MG/ML IJ SOLN
INTRAMUSCULAR | Status: DC | PRN
  Administered 2014-01-01 (×2): 50 ug via INTRAVENOUS

## 2014-01-01 MED ORDER — MIDAZOLAM HCL 5 MG/5ML IJ SOLN
INTRAMUSCULAR | Status: DC | PRN
  Administered 2014-01-01: 1 mg via INTRAVENOUS

## 2014-01-01 MED ORDER — NADOLOL 20 MG PO TABS: 20.0000 mg | ORAL_TABLET | Freq: Every day | ORAL | Status: DC

## 2014-01-01 MED ORDER — SODIUM CHLORIDE 0.9 % IV SOLN
INTRAVENOUS | Status: DC
  Administered 2014-01-01: 20 mL/h via INTRAVENOUS

## 2014-01-01 NOTE — Progress Notes (Signed)
Inpatient Diabetes Program Recommendations  AACE/ADA: New Consensus Statement on Inpatient Glycemic Control (2013)  Target Ranges:  Prepandial:   less than 140 mg/dL      Peak postprandial:   less than 180 mg/dL (1-2 hours)      Critically ill patients:  140 - 180 mg/dL   Results for TAMOTSU, WIEDERHOLT (MRN [id number]) as of 01/01/2014 09:08  Ref. Range 12/31/2013 08:02 12/31/2013 11:47 12/31/2013 16:33 12/31/2013 21:51 01/01/2014 08:52  Glucose-Capillary Latest Range: 70-99 mg/dL 207 (H) 214 (H) 203 (H) 236 (H) 135 (H)   Diabetes history: DM2 Outpatient Diabetes medications: NPH 80 units QAM, NPH 50 units QPM, Metformin 1000 mg BID Current orders for Inpatient glycemic control: Lantus 25 units QHS, Novolog 0-9 units AC  Inpatient Diabetes Program Recommendations Insulin - Basal: Noted Lantus 25 untis QHS is ordered and fasting glucose 135 mg/dl this morning.  Therefore, do not recommend any changes in basal insulin at this time. Correction (SSI): Please consider increasing Novolog correction to moderate scale and add bedtime correction scale. Insulin - Meal Coverage: If patient is eating at least 50% of meals, please consider ordering Novolog 3 units TID with meals.  Thanks, Barnie Alderman, RN, MSN, CCRN Diabetes Coordinator Inpatient Diabetes Program [phone] (Team Pager) [phone] (AP office) [phone] [facility] office)

## 2014-01-01 NOTE — Transfer of Care (Signed)
Immediate Anesthesia Transfer of Care Note  Patient: AKAI DOLLARD  Procedure(s) Performed: Procedure(s): ESOPHAGOGASTRODUODENOSCOPY (EGD) (N/A) ESOPHAGEAL BANDING (N/A)  Patient Location: PACU and Endoscopy Unit  Anesthesia Type:MAC  Level of Consciousness: awake, alert , oriented and patient cooperative  Airway & Oxygen Therapy: Patient Spontanous Breathing and Patient connected to nasal cannula oxygen  Post-op Assessment: Report given to PACU RN, Post -op Vital signs reviewed and stable and Patient moving all extremities  Post vital signs: Reviewed and stable  Complications: No apparent anesthesia complications

## 2014-01-01 NOTE — Anesthesia Preprocedure Evaluation (Addendum)
Anesthesia Evaluation  Patient identified by MRN, date of birth, ID band Patient awake    Reviewed: Allergy & Precautions, H&P , NPO status , Patient's Chart, lab work & pertinent test results, reviewed documented beta blocker date and time   Airway Mallampati: II TM Distance: >3 FB Neck ROM: Full    Dental  (+) Poor Dentition, Dental Advisory Given, Missing   Pulmonary former smoker,  Quit times 8 mos   + decreased breath sounds      Cardiovascular hypertension, Pt. on medications + CAD, + Cardiac Stents and +CHF Rhythm:Regular Rate:Normal     Neuro/Psych    GI/Hepatic   Endo/Other  diabetes, Well Controlled, Type 2, Insulin DependentMorbid obesity  Renal/GU      Musculoskeletal   Abdominal (+) + obese,   Peds  Hematology   Anesthesia Other Findings Missing several teeth upper right and jagged tooth lower left  Reproductive/Obstetrics                          Anesthesia Physical Anesthesia Plan  ASA: III  Anesthesia Plan: MAC   Post-op Pain Management:    Induction: Intravenous  Airway Management Planned: Natural Airway and Simple Face Mask  Additional Equipment:   Intra-op Plan:   Post-operative Plan:   Informed Consent: I have reviewed the patients History and Physical, chart, labs and discussed the procedure including the risks, benefits and alternatives for the proposed anesthesia with the patient or authorized representative who has indicated his/her understanding and acceptance.   Dental advisory given  Plan Discussed with:   Anesthesia Plan Comments: (Cryptogenic cirrhosis with esophageal varices and h/o bleeding Chronic thrombocytopenia Platelet count 53,000 CAD S/P PTCA to L. Cx with BMS 06/2010, No ishemia on myoview 7/12 no angina, EF 55% by ECHO 12/20/13 Htn Type 2 DM glucose 156 htn  Plan MAC  Roberts Gaudy)      Anesthesia Quick Evaluation

## 2014-01-01 NOTE — Anesthesia Postprocedure Evaluation (Signed)
  Anesthesia Post-op Note  Patient: Adam Butler  Procedure(s) Performed: Procedure(s): ESOPHAGOGASTRODUODENOSCOPY (EGD) (N/A) ESOPHAGEAL BANDING (N/A)  Patient Location: PACU  Anesthesia Type: MAC  Level of Consciousness: awake and alert   Airway and Oxygen Therapy: Patient Spontanous Breathing  Post-op Pain: none  Post-op Assessment: Post-op Vital signs reviewed, Patient's Cardiovascular Status Stable and Respiratory Function Stable  Post-op Vital Signs: Reviewed  Filed Vitals:   01/01/14 1220  BP: 108/62  Pulse: 71  Temp:   Resp: 15    Complications: No apparent anesthesia complications

## 2014-01-01 NOTE — Progress Notes (Signed)
Verbally understood DC instructions with no questions asked. Home with wife, pt refused wc, ambulated out-gait steady.

## 2014-01-01 NOTE — Op Note (Signed)
[facility] [place], [address]   ENDOSCOPY PROCEDURE REPORT  PATIENT: Adam, Butler  MR#: [id number] BIRTHDATE: 22-Mar-1956 , 57  yrs. old GENDER: Male ENDOSCOPIST: Jerene Bears, MD REFERRED BY:  [facility] PROCEDURE DATE:  01/01/2014 PROCEDURE:  EGD, diagnostic ASA CLASS:     Class III INDICATIONS:  history of cirrhosis with portal hypertension, history of esophageal varices, repeat variceal screening/surveillance. MEDICATIONS: MAC sedation, administered by CRNA and See Anesthesia Report. TOPICAL ANESTHETIC: Cetacaine Spray  DESCRIPTION OF PROCEDURE: After the risks benefits and alternatives of the procedure were thoroughly explained, informed consent was obtained.  The standard Pentax adult upper endoscope was introduced through the mouth and advanced to the second portion of the duodenum. Without limitations.  The instrument was slowly withdrawn as the mucosa was fully examined.     ESOPHAGUS: There were 3 columns of small varices in the distal and middle third of the esophagus.  The varices flatten completely with insufflation and had no signs or stigmata of bleeding.  STOMACH: The mucosa of the stomach appeared normal.  There is no evidence of portal hypertensive gastropathy or gastric varices.  DUODENUM: The duodenal mucosa showed no abnormalities in the bulb and second portion of the duodenum.  Retroflexed views revealed no abnormalities.     The scope was then withdrawn from the patient and the procedure completed.  COMPLICATIONS: There were no complications.  ENDOSCOPIC IMPRESSION: 1.   There were 3 columns of small varices in the distal and middle third of the esophagus; no bleeding stigmata 2.   The mucosa of the stomach appeared normal; no gastric varices seen 3.   The duodenal mucosa showed no abnormalities in the bulb and second portion of the duodenum  RECOMMENDATIONS: 1.  Consider changing  atenolol to nadolol as a nonselective beta blocker given history of portal hypertension and small varices 2.  Office followup with GI 3.  Okay for hospital discharge from GI perspective (to complete 7 days of oral antibiotics for infectious colitis)  eSigned:  Jerene Bears, MD 01/01/2014 12:00 PM   CC:The Patient  PATIENT NAME:  Adam, Butler MR#: [id number]

## 2014-01-01 NOTE — Discharge Summary (Signed)
Physician Discharge Summary  CHAMAR BROUGHTON GNF:[id number] DOB: Nov 13, 1955 DOA: 12/28/2013  PCP: Antonietta Jewel, MD  Admit date: 12/28/2013 Discharge date: 01/01/2014  Time spent: >30 minutes  Recommendations for Outpatient Follow-up:  Follow-up Information   Follow up with [facility], MD. (in 1-2weeks, call for appt upon discharge)    Specialty:  Internal Medicine   Contact information:   [address] [place] [address] [phone]       Follow up with PY[facility], Lajuan Lines, MD. (in 1week, call for appt upon discharge)    Specialty:  Gastroenterology   Contact information:   [address] [place] [address] [phone]        Discharge Diagnoses:  Active Problems:   DIASTOLIC HEART FAILURE, CHRONIC   Cirrhosis of liver without mention of alcohol   Encephalopathy, hepatic   Colitis   Hepatic encephalopathy   Sepsis   Diabetes mellitus   Discharge Condition: improved/stable  Diet recommendation: Modified carbohydrate  Filed Weights   12/28/13 2223 12/29/13 0700  Weight: 127.007 kg (280 lb) 132.6 kg (292 lb 5.3 oz)    History of present illness:  Adam Butler is a 58 y.o. male with past medical history of Esophageal varices; Cirrhosis; Hyperlipemia; Chronic heart failure; Coronary artery disease; Diastolic CHF, acute; Hypertension; Type II diabetes mellitus; SVT (supraventricular tachycardia); GI bleed; Shortness of breath; and S/P coronary artery stent placement who presented with shortness of breath and increased somnolence following a camping trip last week. He also reported back pain and abdominal pain. Some diarrhea for the past few days and some nausea. Patient has trouble remembering what happened. He is not taking lactulose. He has hx of cryptogenic cirrhosis. Anomia level was 139. CT scan showed partial portal and splenic vein thrombosis likely chronic and evidence of colitis.  Lactic acid noted elevated to 3.2. Wife states he stopped taking his  medications and stopped seeing liver doctor. Patient is confused and unsure about his care at this point. He was admitted for further evaluation and management.   Hospital Course:  . Sepsis syndrome secondary to probable infectious colitis  As discussed above on admission patient had a CT scan of the abdomen and pelvis which showed bowel wall thickening involving the ascending and mid transverse colon>> infectious vs ischemic vs inflammatory  -Blood and stool cultures were ordered and he was started on empiric antibiotics with Zosyn -C. difficile was done and came back negative -He improved on empiric Abx, changed Zosyn to PO Cipro/Flagyl  -blood cultures negative so far -lactic acid was elevated on admission and trended down with treatment -also noted to have partial thrombosis of Splenic and SMV-likely chronic per GI and therefore anticoagulation was not initiated>> than EGD was are recommended>> done today 6/24 and the results as discussed below. -Patient is much clinically improved >> lucid, tolerating by mouth as well and GI recommended to complete 7 daycourse of antibiotics for probable infectious colitis. He is to follow up outpatient. . Cirrhosis of liver with  varices  -likely due to NASH  -workup for other causes of cirrhosis negative in 2012  -GI was consulted and Dr. Hilarie Fredrickson saw patient and EGD was done and it showed 3 columns of small varices in the distal and middle third of the esophagus, no bleeding stigmata. He recommended changing him to Nadolol (from atenolol). Patient is to followup in office for further monitoring and adjusting/titrating his Nadolol dose as clinically appropriate.  . Diastolic CHF, chronic  compensated, he is to  resume his Lasix on discharge and follow up outpatient   . Encephalopathy, hepatic  -upon admission his ammonia level was elevated at 125. He was treated with lactulose and he improved clinically>> His altered mental status resolved and also his  ammonia level on recheck normalized-53. The lactulose was then discontinued and patient was placed on rifaximin wishes to continue upon discharge. He is to followup with her GI upon discharge.       Thrombocytopenia  -due to cirrhosis   DM  -stable,  -continue SSI and lantus 25 units qhs  CAD  -h/o BMS to circumflex in 2011  -resume ASA, off statins per PCP due to liver ds   Procedures:  EGD  ENDOSCOPIC IMPRESSION:  1. There were 3 columns of small varices in the distal and middle  third of the esophagus; no bleeding stigmata  2. The mucosa of the stomach appeared normal; no gastric varices  seen  3. The duodenal mucosa showed no abnormalities in the bulb and  second portion of the duodenum  RECOMMENDATIONS:  1. Consider changing atenolol to nadolol as a nonselective beta  blocker given history of portal hypertension and small varices  2. Office followup with GI  3. Okay for hospital discharge from GI perspective (to complete 7  days of oral antibiotics for infectious colitis)   Consultations:  GI  Discharge Exam: Filed Vitals:   01/01/14 1251  BP: 124/65  Pulse: 71  Temp: 97.5 F (36.4 C)  Resp: 16   Exam:  General: AAOx 3, no distress, mentation much imporved  Cardiovascular: S1S2/RRR  Respiratory: diminished BS at bases  Abdomen: soft, much decreased RLQ and LLQ tenderness, BS present Musculoskeletal: trace edema c/c  Neuro: no asterixis, non focal   Discharge Instructions You were cared for by a hospitalist during your hospital stay. If you have any questions about your discharge medications or the care you received while you were in the hospital after you are discharged, you can call the unit and asked to speak with the hospitalist on call if the hospitalist that took care of you is not available. Once you are discharged, your primary care physician will handle any further medical issues. Please note that NO REFILLS for any discharge medications will be  authorized once you are discharged, as it is imperative that you return to your primary care physician (or establish a relationship with a primary care physician if you do not have one) for your aftercare needs so that they can reassess your need for medications and monitor your lab values.  Discharge Instructions   Diet Carb Modified    Complete by:  As directed      Increase activity slowly    Complete by:  As directed             Medication List    STOP taking these medications       atenolol 50 MG tablet  Commonly known as:  TENORMIN      TAKE these medications       aspirin 81 MG tablet  Take 81 mg by mouth daily.     ciprofloxacin 500 MG tablet  Commonly known as:  CIPRO  Take 1 tablet (500 mg total) by mouth 2 (two) times daily.     enalapril 20 MG tablet  Commonly known as:  VASOTEC  Take 20 mg by mouth daily.     furosemide 40 MG tablet  Commonly known as:  LASIX  Take 20 mg by mouth  daily.     insulin NPH Human 100 UNIT/ML injection  Commonly known as:  HUMULIN N,NOVOLIN N  Inject 20-80 Units into the skin See admin instructions. Take 80 units in the morning and 50 units in the evening.     metFORMIN 500 MG tablet  Commonly known as:  GLUCOPHAGE  Take 1,000 mg by mouth 2 (two) times daily.     metroNIDAZOLE 500 MG tablet  Commonly known as:  FLAGYL  Take 1 tablet (500 mg total) by mouth every 8 (eight) hours.     multivitamin capsule  Take 1 capsule by mouth daily.     nadolol 20 MG tablet  Commonly known as:  CORGARD  Take 1 tablet (20 mg total) by mouth daily.     nitroGLYCERIN 0.4 MG SL tablet  Commonly known as:  NITROSTAT  Place 1 tablet (0.4 mg total) under the tongue every 5 (five) minutes as needed.     omeprazole 40 MG capsule  Commonly known as:  PRILOSEC  Take 1 capsule (40 mg total) by mouth daily.     rifaximin 550 MG Tabs tablet  Commonly known as:  XIFAXAN  Take 1 tablet (550 mg total) by mouth 2 (two) times daily.        Allergies  Allergen Reactions  . Sulfa Antibiotics        Follow-up Information   Follow up with [facility], MD. (in 1-2weeks, call for appt upon discharge)    Specialty:  Internal Medicine   Contact information:   [address] [place] [address] [phone]       Follow up with [facility], Lajuan Lines, MD. (in 1week, call for appt upon discharge)    Specialty:  Gastroenterology   Contact information:   [address] [place] [address] [phone]        The results of significant diagnostics from this hospitalization (including imaging, microbiology, ancillary and laboratory) are listed below for reference.    Significant Diagnostic Studies: Dg Chest 2 View  12/28/2013   CLINICAL DATA:  Altered mental status. Shortness of breath and back pain.  EXAM: CHEST  2 VIEW  COMPARISON:  Chest radiograph performed 04/19/2013  FINDINGS: The lungs are hypoexpanded. Vascular crowding is noted. Minimal bilateral atelectasis is seen. There is no evidence of pleural effusion or pneumothorax.  The heart is borderline enlarged. No acute osseous abnormalities are seen.  IMPRESSION: Lungs hypoexpanded and difficult to fully assess. Minimal bilateral atelectasis noted. Borderline cardiomegaly.   Electronically Signed   By: Garald Balding M.D.   On: 12/28/2013 23:28   Ct Head Wo Contrast  12/29/2013   CLINICAL DATA:  Altered level of consciousness.  Frontal headache.  EXAM: CT HEAD WITHOUT CONTRAST  TECHNIQUE: Contiguous axial images were obtained from the base of the skull through the vertex without intravenous contrast.  COMPARISON:  CT of the head performed 07/03/2010  FINDINGS: There is no evidence of acute infarction, mass lesion, or intra- or extra-axial hemorrhage on CT.  Scattered subcortical white matter change likely reflects small vessel ischemic microangiopathy. A small chronic infarct is noted at the right occipital lobe.  The posterior fossa, including the cerebellum,  brainstem and fourth ventricle, is within normal limits. The third and lateral ventricles, and basal ganglia are unremarkable in appearance. No mass effect or midline shift is seen.  There is no evidence of fracture; visualized osseous structures are unremarkable in appearance. The orbits are within normal limits. Mild mucosal thickening is noted  at the right side of the sphenoid sinus. The remaining paranasal sinuses and mastoid air cells are well-aerated. No significant soft tissue abnormalities are seen. Apparent focal soft tissue thickening along the right posterior side of the neck is thought to reflect volume averaging.  IMPRESSION: 1. No acute intracranial pathology seen on CT. 2. Scattered small vessel ischemic microangiopathy. 3. Small chronic infarct at the right occipital lobe. 4. Mild mucosal thickening at the right side of the sphenoid sinus.   Electronically Signed   By: Garald Balding M.D.   On: 12/29/2013 01:36   Ct Abdomen Pelvis W Contrast  12/29/2013   CLINICAL DATA:  Frontal headache, left lower quadrant abdominal pain  EXAM: CT ABDOMEN AND PELVIS WITH CONTRAST  TECHNIQUE: Multidetector CT imaging of the abdomen and pelvis was performed using the standard protocol following bolus administration of intravenous contrast.  COMPARISON:  none  CONTRAST:  179m OMNIPAQUE IOHEXOL 300 MG/ML  SOLN  FINDINGS: Lung bases are clear.  No pericardial fluid.  The liver has a lobular contour consistent with cirrhosis. There are gallstones layering dependently in the gallbladder. No evidence of pancreatic inflammation. The spleen is enlarged. This extensive venous collaterals extending along the splenic hilum and along the esophagus. The left portal vein is patent. Right portal vein is diminutive. There is haziness the central mesentery.  Adrenal glands and kidneys are normal.  The stomach, small bowel cecum are normal.  There is thickening of the ascending colon with pericolonic inflammation. This extends  into the transverse colon. The rectosigmoid colon appears normal.  Abdominal or is normal caliber. intimal calcification of the aorta.  There is a partially occlusive thrombus within the superior mesenteric vein. There partially occlusive thrombus within the splenic vein which is partially occlusive. There is recanalization of the umbilical vein.  No free fluid the pelvis. The prostate gland and bladder normal. No pelvic lymphadenopathy. No aggressive osseous lesion.  IMPRESSION: 1. Haziness within the central mesentery which could relate to portal hypertension. 2. There is bowel wall thickening involving the ascending colon and mid transverse colon. This segmental bowel wall thickening/edema could represent ischemic change. Secondary differentials would include inflammatory bowel disease and infectious colitis or drug-induced colitis. The edema could represent passive congestion from the partial venous occlusion. 3. Cirrhosis and portal hypertension with multiple venous collaterals. 4. Partial thrombosis of the splenic vein and superior mesenteric vein.   Electronically Signed   By: SSuzy BouchardM.D.   On: 12/29/2013 01:45    Microbiology: Recent Results (from the past 240 hour(s))  CULTURE, BLOOD (ROUTINE X 2)     Status: None   Collection Time    12/29/13  4:33 AM      Result Value Ref Range Status   Specimen Description BLOOD RIGHT HAND   Final   Special Requests BOTTLES DRAWN AEROBIC [facility]OBIC [facility]   Final   Culture  Setup Time     Final   Value: 12/29/2013 14:37     Performed at [facility]  Culture     Final   Value:        BLOOD CULTURE RECEIVED NO GROWTH TO DATE CULTURE WILL BE HELD FOR 5 DAYS BEFORE ISSUING A FINAL NEGATIVE REPORT     Performed at [facility]  Report Status PENDING   Incomplete  CULTURE, BLOOD (ROUTINE X 2)     Status: None   Collection Time    12/29/13  4:41 AM  Result Value Ref Range Status   Specimen Description BLOOD RIGHT WRIST    Final   Special Requests BOTTLES DRAWN AEROBIC AND ANAEROBIC [facility] EACH   Final   Culture  Setup Time     Final   Value: 12/29/2013 14:37     Performed at [facility]   Culture     Final   Value:        BLOOD CULTURE RECEIVED NO GROWTH TO DATE CULTURE WILL BE HELD FOR 5 DAYS BEFORE ISSUING A FINAL NEGATIVE REPORT     Performed at [facility]   Report Status PENDING   Incomplete  STOOL CULTURE     Status: None   Collection Time    12/29/13 10:40 AM      Result Value Ref Range Status   Specimen Description PERIRECTAL   Final   Special Requests Normal   Final   Culture     Final   Value: NO SUSPICIOUS COLONIES, CONTINUING TO HOLD     Performed at [facility]   Report Status PENDING   Incomplete  CLOSTRIDIUM DIFFICILE BY PCR     Status: None   Collection Time    12/29/13 10:40 AM      Result Value Ref Range Status   C difficile by pcr NEGATIVE  NEGATIVE Final     Labs: Basic Metabolic Panel:  Recent Labs Lab 12/28/13 2236 12/29/13 0600 12/30/13 0248 12/31/13 0343  NA 137 141 135* 138  K 4.0 4.9 3.6* 3.9  CL 100 105 102 102  CO2 20 21 23 22   GLUCOSE 174* 108* 126* 156*  BUN 13 14 13 9   CREATININE 0.72 0.85 0.78 0.77  CALCIUM 9.2 8.8 8.7 8.7  MG  --  1.3*  --   --   PHOS  --  4.3  --   --    Liver Function Tests:  Recent Labs Lab 12/28/13 2236 12/29/13 0600 12/30/13 0248 12/31/13 0343  AST 69* 63* 54* 58*  ALT 51 50 45 43  ALKPHOS 142* 95 63 66  BILITOT 1.4* 2.4* 1.8* 1.1  PROT 6.6 6.1 5.8* 6.0  ALBUMIN 2.9* 2.7* 2.5* 2.4*   No results found for this basename: LIPASE, AMYLASE,  in the last 168 hours  Recent Labs Lab 12/29/13 0015 12/29/13 0600 12/31/13 0343  AMMONIA 139* 125* 53   CBC:  Recent Labs Lab 12/28/13 2236 12/29/13 0600 12/30/13 0248 12/31/13 0343  WBC 9.7 12.9* 7.2 4.3  NEUTROABS  --  10.2*  --   --   HGB 14.1 13.1 12.0* 11.7*  HCT 39.8 38.7* 35.3* 34.9*  MCV 97.3 99.0 98.6 98.6  PLT 69* 59* 51* 53*    Cardiac Enzymes:  Recent Labs Lab 12/29/13 0538  TROPONINI <0.30   BNP: BNP (last 3 results) No results found for this basename: PROBNP,  in the last 8760 hours CBG:  Recent Labs Lab 12/31/13 1147 12/31/13 1633 12/31/13 2151 01/01/14 0852 01/01/14 1248  GLUCAP 214* 203* 236* 135* 131*       Signed:  Arial Galligan C  [facility] 01/01/2014, 3:49 PM

## 2014-01-01 NOTE — Anesthesia Postprocedure Evaluation (Signed)
  Anesthesia Post-op Note  Patient: Adam Butler  Procedure(s) Performed: Procedure(s): ESOPHAGOGASTRODUODENOSCOPY (EGD) (N/A) ESOPHAGEAL BANDING (N/A)  Patient Location: PACU and Endoscopy Unit  Anesthesia Type:MAC  Level of Consciousness: awake, alert , oriented and patient cooperative  Airway and Oxygen Therapy: Patient Spontanous Breathing and Patient connected to nasal cannula oxygen  Post-op Pain: none  Post-op Assessment: Post-op Vital signs reviewed, Patient's Cardiovascular Status Stable, Respiratory Function Stable and Patent Airway  Post-op Vital Signs: Reviewed and stable  Last Vitals:  Filed Vitals:   01/01/14 1017  BP: 152/69  Pulse: 79  Temp: 36.4 C  Resp: 21    Complications: No apparent anesthesia complications

## 2014-01-02 ENCOUNTER — Encounter (HOSPITAL_COMMUNITY): Payer: Self-pay | Admitting: Internal Medicine

## 2014-01-02 LAB — STOOL CULTURE: Special Requests: NORMAL

## 2014-01-04 LAB — CULTURE, BLOOD (ROUTINE X 2)
Culture: NO GROWTH
Culture: NO GROWTH

## 2014-01-12 ENCOUNTER — Inpatient Hospital Stay (HOSPITAL_COMMUNITY)
Admission: EM | Admit: 2014-01-12 | Discharge: 2014-01-13 | DRG: 442 | Disposition: A | Discharge status: Home / Self Care | Payer: Medicare Other | Attending: Internal Medicine | Admitting: Internal Medicine

## 2014-01-12 ENCOUNTER — Emergency Department (HOSPITAL_COMMUNITY): Payer: Medicare Other

## 2014-01-12 ENCOUNTER — Encounter (HOSPITAL_COMMUNITY): Payer: Self-pay | Admitting: *Deleted

## 2014-01-12 DIAGNOSIS — Z8249 Family history of ischemic heart disease and other diseases of the circulatory system: Secondary | ICD-10-CM

## 2014-01-12 DIAGNOSIS — Z7982 Long term (current) use of aspirin: Secondary | ICD-10-CM

## 2014-01-12 DIAGNOSIS — Z794 Long term (current) use of insulin: Secondary | ICD-10-CM

## 2014-01-12 DIAGNOSIS — E119 Type 2 diabetes mellitus without complications: Secondary | ICD-10-CM | POA: Diagnosis present

## 2014-01-12 DIAGNOSIS — I8510 Secondary esophageal varices without bleeding: Secondary | ICD-10-CM | POA: Diagnosis present

## 2014-01-12 DIAGNOSIS — Z833 Family history of diabetes mellitus: Secondary | ICD-10-CM

## 2014-01-12 DIAGNOSIS — Z9089 Acquired absence of other organs: Secondary | ICD-10-CM

## 2014-01-12 DIAGNOSIS — K7290 Hepatic failure, unspecified without coma: Principal | ICD-10-CM | POA: Diagnosis present

## 2014-01-12 DIAGNOSIS — I10 Essential (primary) hypertension: Secondary | ICD-10-CM | POA: Diagnosis present

## 2014-01-12 DIAGNOSIS — K7460 Unspecified cirrhosis of liver: Secondary | ICD-10-CM | POA: Diagnosis present

## 2014-01-12 DIAGNOSIS — Z6841 Body Mass Index (BMI) 40.0 and over, adult: Secondary | ICD-10-CM

## 2014-01-12 DIAGNOSIS — Z9861 Coronary angioplasty status: Secondary | ICD-10-CM

## 2014-01-12 DIAGNOSIS — I8500 Esophageal varices without bleeding: Secondary | ICD-10-CM

## 2014-01-12 DIAGNOSIS — IMO0002 Reserved for concepts with insufficient information to code with codable children: Secondary | ICD-10-CM | POA: Diagnosis present

## 2014-01-12 DIAGNOSIS — Z882 Allergy status to sulfonamides status: Secondary | ICD-10-CM

## 2014-01-12 DIAGNOSIS — I509 Heart failure, unspecified: Secondary | ICD-10-CM | POA: Diagnosis present

## 2014-01-12 DIAGNOSIS — K529 Noninfective gastroenteritis and colitis, unspecified: Secondary | ICD-10-CM | POA: Diagnosis present

## 2014-01-12 DIAGNOSIS — Z836 Family history of other diseases of the respiratory system: Secondary | ICD-10-CM

## 2014-01-12 DIAGNOSIS — E1165 Type 2 diabetes mellitus with hyperglycemia: Secondary | ICD-10-CM | POA: Diagnosis present

## 2014-01-12 DIAGNOSIS — K5289 Other specified noninfective gastroenteritis and colitis: Secondary | ICD-10-CM | POA: Diagnosis present

## 2014-01-12 DIAGNOSIS — K7682 Hepatic encephalopathy: Principal | ICD-10-CM | POA: Diagnosis present

## 2014-01-12 DIAGNOSIS — Z87891 Personal history of nicotine dependence: Secondary | ICD-10-CM

## 2014-01-12 DIAGNOSIS — D6959 Other secondary thrombocytopenia: Secondary | ICD-10-CM | POA: Diagnosis present

## 2014-01-12 DIAGNOSIS — I5032 Chronic diastolic (congestive) heart failure: Secondary | ICD-10-CM

## 2014-01-12 DIAGNOSIS — E118 Type 2 diabetes mellitus with unspecified complications: Secondary | ICD-10-CM

## 2014-01-12 DIAGNOSIS — Z79899 Other long term (current) drug therapy: Secondary | ICD-10-CM

## 2014-01-12 DIAGNOSIS — E785 Hyperlipidemia, unspecified: Secondary | ICD-10-CM | POA: Diagnosis present

## 2014-01-12 DIAGNOSIS — E669 Obesity, unspecified: Secondary | ICD-10-CM | POA: Diagnosis present

## 2014-01-12 LAB — COMPREHENSIVE METABOLIC PANEL
ALK PHOS: 138 U/L — AB (ref 39–117)
ALT: 75 U/L — ABNORMAL HIGH (ref 0–53)
AST: 75 U/L — ABNORMAL HIGH (ref 0–37)
Albumin: 2.8 g/dL — ABNORMAL LOW (ref 3.5–5.2)
Anion gap: 12 (ref 5–15)
BILIRUBIN TOTAL: 1.6 mg/dL — AB (ref 0.3–1.2)
BUN: 13 mg/dL (ref 6–23)
CALCIUM: 8.8 mg/dL (ref 8.4–10.5)
CHLORIDE: 104 meq/L (ref 96–112)
CO2: 25 mEq/L (ref 19–32)
Creatinine, Ser: 0.93 mg/dL (ref 0.50–1.35)
GLUCOSE: 188 mg/dL — AB (ref 70–99)
POTASSIUM: 4.1 meq/L (ref 3.7–5.3)
SODIUM: 141 meq/L (ref 137–147)
Total Protein: 6.8 g/dL (ref 6.0–8.3)

## 2014-01-12 LAB — CBC WITH DIFFERENTIAL/PLATELET
BASOS ABS: 0 10*3/uL (ref 0.0–0.1)
Basophils Relative: 0 % (ref 0–1)
EOS PCT: 1 % (ref 0–5)
Eosinophils Absolute: 0.1 10*3/uL (ref 0.0–0.7)
HCT: 42 % (ref 39.0–52.0)
Hemoglobin: 14.2 g/dL (ref 13.0–17.0)
LYMPHS ABS: 1.1 10*3/uL (ref 0.7–4.0)
LYMPHS PCT: 13 % (ref 12–46)
MCH: 33.8 pg (ref 26.0–34.0)
MCHC: 33.8 g/dL (ref 30.0–36.0)
MCV: 100 fL (ref 78.0–100.0)
Monocytes Absolute: 1.1 10*3/uL — ABNORMAL HIGH (ref 0.1–1.0)
Monocytes Relative: 13 % — ABNORMAL HIGH (ref 3–12)
NEUTROS PCT: 73 % (ref 43–77)
Neutro Abs: 6.3 10*3/uL (ref 1.7–7.7)
Platelets: 75 10*3/uL — ABNORMAL LOW (ref 150–400)
RBC: 4.2 MIL/uL — AB (ref 4.22–5.81)
RDW: 13.7 % (ref 11.5–15.5)
WBC: 8.6 10*3/uL (ref 4.0–10.5)

## 2014-01-12 LAB — AMMONIA: AMMONIA: 146 umol/L — AB (ref 11–60)

## 2014-01-12 LAB — URINALYSIS, ROUTINE W REFLEX MICROSCOPIC
BILIRUBIN URINE: NEGATIVE
GLUCOSE, UA: 100 mg/dL — AB
Hgb urine dipstick: NEGATIVE
KETONES UR: NEGATIVE mg/dL
LEUKOCYTES UA: NEGATIVE
Nitrite: NEGATIVE
PH: 6.5 (ref 5.0–8.0)
Protein, ur: NEGATIVE mg/dL
SPECIFIC GRAVITY, URINE: 1.025 (ref 1.005–1.030)
Urobilinogen, UA: 1 mg/dL (ref 0.0–1.0)

## 2014-01-12 LAB — I-STAT CG4 LACTIC ACID, ED: Lactic Acid, Venous: 2.11 mmol/L (ref 0.5–2.2)

## 2014-01-12 LAB — GLUCOSE, CAPILLARY: Glucose-Capillary: 109 mg/dL — ABNORMAL HIGH (ref 70–99)

## 2014-01-12 MED ORDER — DEXTROSE 5 % IV SOLN
1.0000 g | INTRAVENOUS | Status: DC
  Administered 2014-01-12: 1 g via INTRAVENOUS
  Filled 2014-01-12 (×2): qty 10

## 2014-01-12 MED ORDER — ONDANSETRON HCL 4 MG/2ML IJ SOLN
4.0000 mg | Freq: Four times a day (QID) | INTRAMUSCULAR | Status: DC | PRN
Start: 2014-01-12 — End: 2014-01-13

## 2014-01-12 MED ORDER — INSULIN ASPART 100 UNIT/ML ~~LOC~~ SOLN: 0.0000 [IU] | Freq: Every day | SUBCUTANEOUS | Status: DC

## 2014-01-12 MED ORDER — ENALAPRIL MALEATE 20 MG PO TABS
20.0000 mg | ORAL_TABLET | Freq: Every day | ORAL | Status: DC
  Administered 2014-01-12 – 2014-01-13 (×2): 20 mg via ORAL
  Filled 2014-01-12 (×2): qty 1

## 2014-01-12 MED ORDER — NADOLOL 20 MG PO TABS
20.0000 mg | ORAL_TABLET | Freq: Every day | ORAL | Status: DC
  Administered 2014-01-12 – 2014-01-13 (×2): 20 mg via ORAL
  Filled 2014-01-12 (×3): qty 1

## 2014-01-12 MED ORDER — LACTULOSE ENEMA
300.0000 mL | Freq: Once | ORAL | Status: DC
  Filled 2014-01-12: qty 300

## 2014-01-12 MED ORDER — ASPIRIN 81 MG PO CHEW
81.0000 mg | CHEWABLE_TABLET | Freq: Every day | ORAL | Status: DC
  Administered 2014-01-12 – 2014-01-13 (×2): 81 mg via ORAL
  Filled 2014-01-12 (×2): qty 1

## 2014-01-12 MED ORDER — ENOXAPARIN SODIUM 40 MG/0.4ML ~~LOC~~ SOLN
40.0000 mg | SUBCUTANEOUS | Status: DC
  Filled 2014-01-12: qty 0.4

## 2014-01-12 MED ORDER — FUROSEMIDE 20 MG PO TABS
20.0000 mg | ORAL_TABLET | Freq: Every day | ORAL | Status: DC
  Administered 2014-01-12 – 2014-01-13 (×2): 20 mg via ORAL
  Filled 2014-01-12: qty 1

## 2014-01-12 MED ORDER — ONDANSETRON HCL 4 MG PO TABS: 4.0000 mg | ORAL_TABLET | Freq: Four times a day (QID) | ORAL | Status: DC | PRN

## 2014-01-12 MED ORDER — ASPIRIN 81 MG PO TABS: 81.0000 mg | ORAL_TABLET | Freq: Every day | ORAL | Status: DC

## 2014-01-12 MED ORDER — PANTOPRAZOLE SODIUM 40 MG PO TBEC
40.0000 mg | DELAYED_RELEASE_TABLET | Freq: Every day | ORAL | Status: DC
  Administered 2014-01-12 – 2014-01-13 (×2): 40 mg via ORAL
  Filled 2014-01-12 (×2): qty 1

## 2014-01-12 MED ORDER — NITROGLYCERIN 0.4 MG SL SUBL: 0.4000 mg | SUBLINGUAL_TABLET | SUBLINGUAL | Status: DC | PRN

## 2014-01-12 MED ORDER — INSULIN ASPART 100 UNIT/ML ~~LOC~~ SOLN
0.0000 [IU] | Freq: Three times a day (TID) | SUBCUTANEOUS | Status: DC
  Administered 2014-01-13: 3 [IU] via SUBCUTANEOUS

## 2014-01-12 MED ORDER — LACTULOSE 10 GM/15ML PO SOLN
30.0000 g | ORAL | Status: DC | PRN
Start: 2014-01-12 — End: 2014-01-13
  Administered 2014-01-12: 30 g via ORAL
  Filled 2014-01-12 (×2): qty 45

## 2014-01-12 MED ORDER — RIFAXIMIN 550 MG PO TABS
550.0000 mg | ORAL_TABLET | Freq: Two times a day (BID) | ORAL | Status: DC
  Administered 2014-01-12 – 2014-01-13 (×2): 550 mg via ORAL
  Filled 2014-01-12 (×3): qty 1

## 2014-01-12 NOTE — ED Notes (Signed)
Report given to Manuela Schwartz

## 2014-01-12 NOTE — ED Provider Notes (Signed)
CSN: [id number]     Arrival date & time 01/12/14  1731 History   First MD Initiated Contact with Patient 01/12/14 1731     Chief Complaint  Patient presents with  . Weakness     (Consider location/radiation/quality/duration/timing/severity/associated sxs/prior Treatment) HPI 58 year old male presents with weakness and fatigue. This is from EMS and nursing staff. When I asked the patient why is here he states "I don't know". He does notice in the hospital and knows his 1. Is unsure of the month and day. EMS notes that the patient was recently discharged for hepatic encephalopathy and was supposed to be starting on rifaximin. However this was very expensive and the patient never got it filled but has not taken it. Patient denies any fevers. He does endorse a cough. The history is difficult as the patient appears altered and there is no family at the bedside.  Past Medical History  Diagnosis Date  . Esophageal varices 04/2012  . Cirrhosis 2011    Cryptogenic, Likely NASH. Family/pt deny EtOH. HCV, HBV, HAV negative. ANA negative. AMA positive. Ascites 12/11  . Hyperlipemia   . Coronary artery disease     Inferior MI 12/11; LHC with occluded mid CFX and 80% proximal RCA. EF 55%. He had 3.0 x 28 vision BMS to CFX  . Diastolic CHF, acute     Echo 12/11 with ef 50-55% and mild LVH. EF 55% by LV0gram in 12/11  . Hypertension   . Type II diabetes mellitus 2011  . SVT (supraventricular tachycardia)     1/12: appeared to be an ectopic atrial tachycardia. Required DCCV with hemodynamic instability  . GI bleed     12/11: Etiology not clearly defined. EGD: nonbleeding esophageal varices.  . S/P coronary artery stent placement 06/2010  . Esophageal varices 2011, 2013    no hx acute variceal bleed  . Hepatic encephalopathy 2011, 12/2013   Past Surgical History  Procedure Laterality Date  . Cardiac catheterization  07/01/2010    BMS to CFX.  Marland Kitchen Appendectomy    . Orif r leg    .  Esophagogastroduodenoscopy  04/13/2012    Procedure: ESOPHAGOGASTRODUODENOSCOPY (EGD);  Surgeon: Inda Castle, MD;  Location: [facility];  Service: Endoscopy;  Laterality: N/A;  . Colonoscopy  04/13/2012    Procedure: COLONOSCOPY;  Surgeon: Inda Castle, MD;  Location: [facility]OSCOPY;  Service: Endoscopy;  Laterality: N/A;  . Esophagogastroduodenoscopy N/A 01/01/2014    Procedure: ESOPHAGOGASTRODUODENOSCOPY (EGD);  Surgeon: Jerene Bears, MD;  Location: [facility]COPY;  Service: Endoscopy;  Laterality: N/A;  . Esophageal banding N/A 01/01/2014    Procedure: ESOPHAGEAL BANDING;  Surgeon: Jerene Bears, MD;  Location: [facility] ENDOSCOPY;  Service: Endoscopy;  Laterality: N/A;   Family History  Problem Relation Age of Onset  . Heart attack Brother 35    MI  . Heart attack Brother 82    MI  . Heart attack Father   . Diabetes Father   . Diabetes Brother   . COPD Mother    History  Substance Use Topics  . Smoking status: Former Smoker -- 1.00 packs/day for 36 years    Types: Cigarettes    Quit date: 06/03/2013  . Smokeless tobacco: Former Systems developer  . Alcohol Use: No    Review of Systems  Unable to perform ROS: Mental status change      Allergies  Sulfa antibiotics  Home Medications   Prior to Admission medications   Medication Sig Start Date End Date Taking? Authorizing  Provider  aspirin 81 MG tablet Take 81 mg by mouth daily.     Historical Provider, MD  ciprofloxacin (CIPRO) 500 MG tablet Take 1 tablet (500 mg total) by mouth 2 (two) times daily. 01/01/14   Sheila Oats, MD  enalapril (VASOTEC) 20 MG tablet Take 20 mg by mouth daily.    Historical Provider, MD  furosemide (LASIX) 40 MG tablet Take 20 mg by mouth daily. 01/09/12   Larey Dresser, MD  insulin NPH Human (HUMULIN N,NOVOLIN N) 100 UNIT/ML injection Inject 20-80 Units into the skin See admin instructions. Take 80 units in the morning and 50 units in the evening.    Historical Provider, MD  metFORMIN (GLUCOPHAGE) 500 MG  tablet Take 1,000 mg by mouth 2 (two) times daily.     Historical Provider, MD  metroNIDAZOLE (FLAGYL) 500 MG tablet Take 1 tablet (500 mg total) by mouth every 8 (eight) hours. 01/01/14   Sheila Oats, MD  Multiple Vitamin (MULTIVITAMIN) capsule Take 1 capsule by mouth daily.      Historical Provider, MD  nadolol (CORGARD) 20 MG tablet Take 1 tablet (20 mg total) by mouth daily. 01/01/14   Sheila Oats, MD  nitroGLYCERIN (NITROSTAT) 0.4 MG SL tablet Place 1 tablet (0.4 mg total) under the tongue every 5 (five) minutes as needed. 01/09/12   Larey Dresser, MD  omeprazole (PRILOSEC) 40 MG capsule Take 1 capsule (40 mg total) by mouth daily. 01/01/14   Sheila Oats, MD  rifaximin (XIFAXAN) 550 MG TABS tablet Take 1 tablet (550 mg total) by mouth 2 (two) times daily. 01/01/14   Sheila Oats, MD   BP 131/65  Pulse 75  Temp(Src) 99.8 F (37.7 C) (Oral)  Resp 28  Ht 5' 8"  (1.727 m)  Wt 277 lb (125.646 kg)  BMI 42.13 kg/m2  SpO2 98% Physical Exam  Nursing note and vitals reviewed. Constitutional: He appears well-developed and well-nourished. He appears lethargic. No distress.  obese  HENT:  Head: Normocephalic and atraumatic.  Right Ear: External ear normal.  Left Ear: External ear normal.  Nose: Nose normal.  Eyes: Right eye exhibits no discharge. Left eye exhibits no discharge.  Neck: Neck supple.  Cardiovascular: Normal rate, regular rhythm, normal heart sounds and intact distal pulses.   Pulmonary/Chest: Effort normal. He has rales in the right lower field and the left lower field.  Abdominal: Soft. There is no tenderness.  Musculoskeletal: He exhibits no edema.  Neurological: He appears lethargic. He is disoriented. GCS eye subscore is 3. GCS verbal subscore is 4. GCS motor subscore is 6.  Skin: Skin is warm and dry.    ED Course  Procedures (including critical care time) Labs Review Labs Reviewed  CBC WITH DIFFERENTIAL - Abnormal; Notable for the following:    RBC  4.20 (*)    Platelets 75 (*)    Monocytes Relative 13 (*)    Monocytes Absolute 1.1 (*)    All other components within normal limits  COMPREHENSIVE METABOLIC PANEL - Abnormal; Notable for the following:    Glucose, Bld 188 (*)    Albumin 2.8 (*)    AST 75 (*)    ALT 75 (*)    Alkaline Phosphatase 138 (*)    Total Bilirubin 1.6 (*)    All other components within normal limits  AMMONIA - Abnormal; Notable for the following:    Ammonia 146 (*)    All other components within normal limits  URINALYSIS, ROUTINE W  REFLEX MICROSCOPIC - Abnormal; Notable for the following:    Color, Urine AMBER (*)    Glucose, UA 100 (*)    All other components within normal limits  GLUCOSE, CAPILLARY - Abnormal; Notable for the following:    Glucose-Capillary 109 (*)    All other components within normal limits  CBC  COMPREHENSIVE METABOLIC PANEL  PROTIME-INR  I-STAT CG4 LACTIC ACID, ED    Imaging Review Dg Chest Portable 1 View  01/12/2014   CLINICAL DATA:  WEAKNESS  EXAM: PORTABLE CHEST - 1 VIEW  COMPARISON:  Two-view chest 12/28/2013  FINDINGS: Stable mild cardiomegaly. Low lung volumes. With technique taken into consideration the lungs are clear. No acute osseus abnormalities. Mild degenerative changes within the shoulders.  IMPRESSION: Stable mild cardiomegaly.  No acute cardiopulmonary disease.   Electronically Signed   By: Margaree Mackintosh M.D.   On: 01/12/2014 18:33     EKG Interpretation None      MDM   Final diagnoses:  Encephalopathy, hepatic  Chronic diastolic heart failure  Cirrhosis of liver without mention of alcohol  Colitis  Type 2 diabetes mellitus with complication  Esophageal varices without mention of bleeding    Patient's lethargy and overall weakness. Be coming from hepatic encephalopathy. His likely because he did not take his rifaximin as prescribed. He was not placed on lactulose either. His abdominal exam is un-concerning there is no obvious source of any possible  infection. I feel is altered mental status is from the ammonia level. Due to this we'll give lactulose admit to the hospitalist. At this point he is lethargic but is arousable and communicates when awake and is protecting his airway.    Ephraim Hamburger, MD 01/13/14 [phone]

## 2014-01-12 NOTE — H&P (Signed)
[facility] History and Physical  Patient: Adam Butler  CBS:[id number]  DOB: January 17, 1956  DOS: the patient was seen and examined on 01/12/2014 PCP: Antonietta Jewel, MD  Chief Complaint: confusion and weakness  HPI: Adam Butler is a 58 y.o. male with Past medical history of cryptogenic cirrhosis, esophageal varices, urinary artery disease, diastolic dysfunction, diabetes mellitus type 2, recent infectious colitis. Patient presented with complaints of confusion. He was brought in by his wife. As per the wife the patient was recently discharged from the hospital but since last 2 days has not been acting himself. He also has been having generalized weakness and has been having difficulty holding himself up and doing his ADL. Patient was discharged on Cipro, Flagyl for colitis and rifaximin for hepatic encephalopathy, he was also asked to take nadolol instead of atenolol. Due to financial issue patient has not refilled rifaximin and is not taking any medication for encephalopathy at present. He is also not taking nadolol for unknown Adam. He had an allergic reaction with hives and has seen his PCP for the same and was prescribed azithromycin. Before that hives he has completed his course of antibiotics. He also has been taking Tylenol as and when needed.  The patient is coming from home. And at his baseline independent for most of his ADL.  Review of Systems: as mentioned in the history of present illness.  A Comprehensive review of the other systems is negative.  Past Medical History  Diagnosis Date  . Esophageal varices 04/2012  . Cirrhosis 2011    Cryptogenic, Likely NASH. Family/pt deny EtOH. HCV, HBV, HAV negative. ANA negative. AMA positive. Ascites 12/11  . Hyperlipemia   . Coronary artery disease     Inferior MI 12/11; LHC with occluded mid CFX and 80% proximal RCA. EF 55%. He had 3.0 x 28 vision BMS to CFX  . Diastolic CHF, acute     Echo 12/11 with ef 50-55% and mild  LVH. EF 55% by LV0gram in 12/11  . Hypertension   . Type II diabetes mellitus 2011  . SVT (supraventricular tachycardia)     1/12: appeared to be an ectopic atrial tachycardia. Required DCCV with hemodynamic instability  . GI bleed     12/11: Etiology not clearly defined. EGD: nonbleeding esophageal varices.  . S/P coronary artery stent placement 06/2010  . Esophageal varices 2011, 2013    no hx acute variceal bleed  . Hepatic encephalopathy 2011, 12/2013   Past Surgical History  Procedure Laterality Date  . Cardiac catheterization  07/01/2010    BMS to CFX.  Marland Kitchen Appendectomy    . Orif r leg    . Esophagogastroduodenoscopy  04/13/2012    Procedure: ESOPHAGOGASTRODUODENOSCOPY (EGD);  Surgeon: Inda Castle, MD;  Location: [facility];  Service: Endoscopy;  Laterality: N/A;  . Colonoscopy  04/13/2012    Procedure: COLONOSCOPY;  Surgeon: Inda Castle, MD;  Location: [facility]COPY;  Service: Endoscopy;  Laterality: N/A;  . Esophagogastroduodenoscopy N/A 01/01/2014    Procedure: ESOPHAGOGASTRODUODENOSCOPY (EGD);  Surgeon: Jerene Bears, MD;  Location[facility] END[facility]COPY;  Service: Endoscopy;  Laterality: N/A;  . Esophageal banding N/A 01/01/2014    Procedure: ESOPHAGEAL BANDING;  Surgeon: Jerene Bears, MD;  Location: [facility] ENDOSCOPY;  Service: Endoscopy;  Laterality: N/A;   Social History:  reports that he quit smoking about 7 months ago. His smoking use included Cigarettes. He has a 36 pack-year smoking history. He has quit using smokeless tobacco. He reports that he does not  drink alcohol or use illicit drugs.  Allergies  Allergen Reactions  . Sulfa Antibiotics Other (See Comments)    Family History  Problem Relation Age of Onset  . Heart attack Brother 28    MI  . Heart attack Brother 54    MI  . Heart attack Father   . Diabetes Father   . Diabetes Brother   . COPD Mother     Prior to Admission medications   Medication Sig Start Date End Date Taking? Authorizing Provider  aspirin 81  MG tablet Take 81 mg by mouth daily.     Historical Provider, MD  enalapril (VASOTEC) 20 MG tablet Take 20 mg by mouth daily.    Historical Provider, MD  furosemide (LASIX) 40 MG tablet Take 20 mg by mouth daily. 01/09/12   Larey Dresser, MD  insulin NPH Human (HUMULIN N,NOVOLIN N) 100 UNIT/ML injection Inject 20-80 Units into the skin See admin instructions. Take 80 units in the morning and 50 units in the evening.    Historical Provider, MD  metFORMIN (GLUCOPHAGE) 500 MG tablet Take 1,000 mg by mouth 2 (two) times daily.     Historical Provider, MD  Multiple Vitamin (MULTIVITAMIN) capsule Take 1 capsule by mouth daily.      Historical Provider, MD  nadolol (CORGARD) 20 MG tablet Take 1 tablet (20 mg total) by mouth daily. 01/01/14   Sheila Oats, MD  nitroGLYCERIN (NITROSTAT) 0.4 MG SL tablet Place 1 tablet (0.4 mg total) under the tongue every 5 (five) minutes as needed. 01/09/12   Larey Dresser, MD  omeprazole (PRILOSEC) 40 MG capsule Take 1 capsule (40 mg total) by mouth daily. 01/01/14   Sheila Oats, MD  rifaximin (XIFAXAN) 550 MG TABS tablet Take 1 tablet (550 mg total) by mouth 2 (two) times daily. 01/01/14   Sheila Oats, MD    Physical Exam: Filed Vitals:   01/12/14 1930 01/12/14 1945 01/12/14 2000 01/12/14 2015  BP: 128/64 129/60 122/70 117/97  Pulse: 70 70 70 70  Temp:      TempSrc:      Resp: 25 23 22 25   Height:      Weight:      SpO2: 93% 94% 94% 95%    General: Alert, Awake and Oriented to Time, Place and Person. Appear in mild distress Eyes: PERRL ENT: Oral Mucosa clear moist. Neck: no JVD Cardiovascular: S1 and S2 Present, no Murmur, Peripheral Pulses Present Respiratory: Bilateral Air entry equal and Decreased, Clear to Auscultation,  no Crackles,no wheezes Abdomen: Bowel Sound Present, Soft and distended Non tender Skin: no Rash Extremities: trace Pedal edema, no calf tenderness Neurologic: Grossly no focal neuro deficit.asterixis present  Labs on  Admission:  CBC:  Recent Labs Lab 01/12/14 1756  WBC 8.6  NEUTROABS 6.3  HGB 14.2  HCT 42.0  MCV 100.0  PLT 75*    CMP     Component Value Date/Time   NA 141 01/12/2014 1756   K 4.1 01/12/2014 1756   CL 104 01/12/2014 1756   CO2 25 01/12/2014 1756   GLUCOSE 188* 01/12/2014 1756   BUN 13 01/12/2014 1756   CREATININE 0.93 01/12/2014 1756   CREATININE 0.74 03/15/2011 0827   CALCIUM 8.8 01/12/2014 1756   PROT 6.8 01/12/2014 1756   ALBUMIN 2.8* 01/12/2014 1756   AST 75* 01/12/2014 1756   ALT 75* 01/12/2014 1756   ALKPHOS 138* 01/12/2014 1756   BILITOT 1.6* 01/12/2014 1756   GFRNONAA >90 01/12/2014  [place] >90 01/12/2014 1756    No results found for this basename: LIPASE, AMYLASE,  in the last 168 hours  Recent Labs Lab 01/12/14 1756  AMMONIA 146*    No results found for this basename: CKTOTAL, CKMB, CKMBINDEX, TROPONINI,  in the last 168 hours BNP (last 3 results) No results found for this basename: PROBNP,  in the last 8760 hours  Radiological Exams on Admission: Dg Chest Portable 1 View  01/12/2014   CLINICAL DATA:  WEAKNESS  EXAM: PORTABLE CHEST - 1 VIEW  COMPARISON:  Two-view chest 12/28/2013  FINDINGS: Stable mild cardiomegaly. Low lung volumes. With technique taken into consideration the lungs are clear. No acute osseus abnormalities. Mild degenerative changes within the shoulders.  IMPRESSION: Stable mild cardiomegaly.  No acute cardiopulmonary disease.   Electronically Signed   By: Margaree Mackintosh M.D.   On: 01/12/2014 18:33    Assessment/Plan Principal Problem:   Hepatic encephalopathy Active Problems:   DIASTOLIC HEART FAILURE, CHRONIC   Esophageal varices without mention of bleeding   Cirrhosis of liver without mention of alcohol   Colitis   Diabetes mellitus   1. Hepatic encephalopathy  cirrhosis of liver Esophageal varices   patient is presenting with confusion increased drowsiness and sleepiness. Is alert awake it appears oriented but drowsy He also has asterixis.  Ammonia level is more than 140. At present patient will be admitted to the hospital for hepatic encephalopathy we will give him lactulose orally as he can tolerate oral medications. At present and continue rifaximin and he will need a case manager consultation for financial assistance. SBP prophylaxis with ceftriaxone. Continue Protonix twice a day. Continue nadolol.  2. Thrombocytopenia Appears chronic, due to cirrhosis. Holding aspirin, no pharmacological DVT prophylaxis.  3. diabetes mellitus Holding oral hypoglycemic medications and placing him on sliding scale  4. diastolic heart failure  Continue Lasix  DVT Prophylaxis: mechanical comression Nutrition: low salt and diabetic diet  Code Status: full  Family Communication: wife was present at bedside, opportunity was given to ask question and all questions were answered satisfactorily at the time of interview. Disposition: Admitted to inpatient in med-surge unit.  Author: Berle Mull, MD [facility] Pager: [phone] 01/12/2014, 8:45 PM    If 7PM-7AM, please contact night-coverage [email or web address] Password [email or web address]  **Disclaimer: This note may have been dictated with voice recognition software. Similar sounding words can inadvertently be transcribed and this note may contain transcription errors which may not have been corrected upon publication of note.**

## 2014-01-12 NOTE — ED Notes (Signed)
21st-24 admitted here at [facility] for "Liver problems"  Pt did not get RX filled because med would have been 2000 dollars. Pt c/o weakness ongoing. No pain on arrival

## 2014-01-12 NOTE — ED Notes (Signed)
Report to [employer] on 3E.  Pt to go to floor on monitor with ED tech accompanying.

## 2014-01-12 NOTE — ED Notes (Signed)
Pt is alert, slow to answer, does not know the month, does know the year.  Pt states that he doesn't know why he is here

## 2014-01-13 LAB — GLUCOSE, CAPILLARY
Glucose-Capillary: 94 mg/dL (ref 70–99)
Glucose-Capillary: 191 mg/dL — ABNORMAL HIGH (ref 70–99)

## 2014-01-13 LAB — COMPREHENSIVE METABOLIC PANEL
ALBUMIN: 2.9 g/dL — AB (ref 3.5–5.2)
ALT: 72 U/L — AB (ref 0–53)
AST: 69 U/L — AB (ref 0–37)
Alkaline Phosphatase: 109 U/L (ref 39–117)
Anion gap: 13 (ref 5–15)
BUN: 14 mg/dL (ref 6–23)
CALCIUM: 8.9 mg/dL (ref 8.4–10.5)
CO2: 24 meq/L (ref 19–32)
Chloride: 104 mEq/L (ref 96–112)
Creatinine, Ser: 0.75 mg/dL (ref 0.50–1.35)
GFR calc Af Amer: >90 mL/min (ref >90)
Glucose, Bld: 113 mg/dL — ABNORMAL HIGH (ref 70–99)
Potassium: 3.7 mEq/L (ref 3.7–5.3)
SODIUM: 141 meq/L (ref 137–147)
Total Bilirubin: 2 mg/dL — ABNORMAL HIGH (ref 0.3–1.2)
Total Protein: 6.9 g/dL (ref 6.0–8.3)

## 2014-01-13 LAB — CBC
HCT: 40.6 % (ref 39.0–52.0)
Hemoglobin: 13.7 g/dL (ref 13.0–17.0)
MCH: 33.3 pg (ref 26.0–34.0)
MCHC: 33.7 g/dL (ref 30.0–36.0)
MCV: 98.8 fL (ref 78.0–100.0)
PLATELETS: 75 10*3/uL — AB (ref 150–400)
RBC: 4.11 MIL/uL — AB (ref 4.22–5.81)
RDW: 13.6 % (ref 11.5–15.5)
WBC: 8.9 10*3/uL (ref 4.0–10.5)

## 2014-01-13 LAB — PROTIME-INR
INR: 1.27 (ref 0.00–1.49)
Prothrombin Time: 15.9 seconds — ABNORMAL HIGH (ref 11.6–15.2)

## 2014-01-13 LAB — AMMONIA: Ammonia: 101 umol/L — ABNORMAL HIGH (ref 11–60)

## 2014-01-13 MED ORDER — LACTULOSE 10 GM/15ML PO SOLN: 20.0000 g | Freq: Three times a day (TID) | ORAL | Status: DC

## 2014-01-13 MED ORDER — LACTULOSE POWD: Status: DC

## 2014-01-13 NOTE — Discharge Summary (Signed)
Physician Discharge Summary  Adam Butler VEH:[id number] DOB: 25-Mar-1956 DOA: 01/12/2014  PCP: Antonietta Jewel, MD  Admit date: 01/12/2014 Discharge date: 01/13/2014  Time spent: 25 minutes  Recommendations for Outpatient Follow-up:  1. Medication change: Rifaximin being discontinued 2. Lactulose 30 cc by mouth 3 times a day, titrate up as needed up to 45 cc 4 times a day 3. Patient will followup with his primary care physician in the next 2 weeks  Discharge Diagnoses:  Principal Problem:   Hepatic encephalopathy Active Problems:   DIASTOLIC HEART FAILURE, CHRONIC   Esophageal varices without mention of bleeding   Cirrhosis of liver without mention of alcohol   Colitis   Diabetes mellitus   Discharge Condition: improved, being discharged home  Diet recommendation: low sodium, carb modified  Filed Weights   01/12/14 1739 01/13/14 0201  Weight: 125.646 kg (277 lb) 127.1 kg (280 lb 3.3 oz)    History of present illness:  58 year old white male past medical history of cryptogenic cirrhosis, diastolic dysfunction and diabetes mellitus who was discharged from the hospitalist service 2 weeks prior after being treated for hepatic encephalopathy. At that time he be discharged on rifaximin, but was unable to get it because of cost.  He did not call back to the hospital or speech was primary care physician about it. His wife had the paramedics bring him to the emergency room after several days of worsening confusion. An ammonia level was found to be elevated at 150.he was started on lactulose and admitted to the hospitalist service.  Hospital Course:  Principal Problem:   Hepatic encephalopathy: Patient admitted and started on lactulose. By the following day, he was fully alert and oriented and completely at baseline. Repeat ammonia level was found to be still elevated at 100, but given patient's orientation and lack of other symptoms or issues, he was felt to be stable for discharge.  Recommendation would be to discontinue his rifaximin and he was given a prescription for lactulose 30 cc 3 times a day which can be titrated up to 45 cc 4 times a day accordingly  Active Problems:   DIASTOLIC HEART FAILURE, CHRONIC: Stable medical issue. No signs of Lyme overload   Esophageal varices without mention of bleeding: No signs of bleeding   Cirrhosis of liver without mention of alcohol: Chronic issue   Colitis: Patient was treated for this last admission. Completed antibiotic course   Diabetes mellitus: Blood sugars remained stable  Procedures:  none  Consultations:  none  Discharge Exam: Filed Vitals:   01/13/14 1012  BP: 133/62  Pulse: 68  Temp:   Resp:     General: Alert and oriented x3, no signs of confusion Cardiovascular: regular rate and rhythm, E3-M6, soft 2/6 systolic ejection murmur Respiratory: clear auscultation bilaterally  Discharge Instructions You were cared for by a hospitalist during your hospital stay. If you have any questions about your discharge medications or the care you received while you were in the hospital after you are discharged, you can call the unit and asked to speak with the hospitalist on call if the hospitalist that took care of you is not available. Once you are discharged, your primary care physician will handle any further medical issues. Please note that NO REFILLS for any discharge medications will be authorized once you are discharged, as it is imperative that you return to your primary care physician (or establish a relationship with a primary care physician if you do not have one) for your aftercare needs so  that they can reassess your need for medications and monitor your lab values.  Discharge Instructions   Diet - low sodium heart healthy    Complete by:  As directed      Increase activity slowly    Complete by:  As directed             Medication List    STOP taking these medications       ciprofloxacin 500 MG  tablet  Commonly known as:  CIPRO     metroNIDAZOLE 500 MG tablet  Commonly known as:  FLAGYL      TAKE these medications       aspirin 81 MG tablet  Take 81 mg by mouth daily.     enalapril 20 MG tablet  Commonly known as:  VASOTEC  Take 20 mg by mouth daily.     furosemide 40 MG tablet  Commonly known as:  LASIX  Take 20 mg by mouth daily.     insulin NPH Human 100 UNIT/ML injection  Commonly known as:  HUMULIN N,NOVOLIN N  Inject 20-80 Units into the skin See admin instructions. Take 80 units in the morning and 50 units in the evening.     lactulose 10 GM/15ML solution  Commonly known as:  CHRONULAC  Take 30 mLs (20 g total) by mouth 3 (three) times daily.     Lactulose Powd  30 ml po TID.  Increase as needed if confusion persists.  (Up to 45 four times a day)     metFORMIN 500 MG tablet  Commonly known as:  GLUCOPHAGE  Take 1,000 mg by mouth 2 (two) times daily.     multivitamin capsule  Take 1 capsule by mouth daily.     nadolol 20 MG tablet  Commonly known as:  CORGARD  Take 1 tablet (20 mg total) by mouth daily.     nitroGLYCERIN 0.4 MG SL tablet  Commonly known as:  NITROSTAT  Place 1 tablet (0.4 mg total) under the tongue every 5 (five) minutes as needed.     omeprazole 40 MG capsule  Commonly known as:  PRILOSEC  Take 1 capsule (40 mg total) by mouth daily.     rifaximin 550 MG Tabs tablet  Commonly known as:  XIFAXAN  Take 1 tablet (550 mg total) by mouth 2 (two) times daily.       Allergies  Allergen Reactions  . Sulfa Antibiotics Other (See Comments)       Follow-up Information   Follow up with [facility], MD In 1 week. (Pt. will come on July 7 in am MD aware)    Specialty:  Internal Medicine   Contact information:   [address] [place] [address] [phone]        The results of significant diagnostics from this hospitalization (including imaging, microbiology, ancillary and laboratory) are listed below for  reference.    Significant Diagnostic Studies: Dg Chest 2 View  12/28/2013   IMPRESSION: Lungs hypoexpanded and difficult to fully assess. Minimal bilateral atelectasis noted. Borderline cardiomegaly.   Electronically Signed   By: Garald Balding M.D.   On: 12/28/2013 23:28   Ct Head Wo Contrast  12/29/2013  IMPRESSION: 1. No acute intracranial pathology seen on CT. 2. Scattered small vessel ischemic microangiopathy. 3. Small chronic infarct at the right occipital lobe. 4. Mild mucosal thickening at the right side of the sphenoid sinus.   Electronically Signed   By: Francoise Schaumann.D.  On: 12/29/2013 01:36   Ct Abdomen Pelvis W Contrast  12/29/2013     IMPRESSION: 1. Haziness within the central mesentery which could relate to portal hypertension. 2. There is bowel wall thickening involving the ascending colon and mid transverse colon. This segmental bowel wall thickening/edema could represent ischemic change. Secondary differentials would include inflammatory bowel disease and infectious colitis or drug-induced colitis. The edema could represent passive congestion from the partial venous occlusion. 3. Cirrhosis and portal hypertension with multiple venous collaterals. 4. Partial thrombosis of the splenic vein and superior mesenteric vein.   Electronically Signed   By: Suzy Bouchard M.D.   On: 12/29/2013 01:45   Dg Chest Portable 1 View  01/12/2014     IMPRESSION: Stable mild cardiomegaly.  No acute cardiopulmonary disease.   Electronically Signed   By: Margaree Mackintosh M.D.   On: 01/12/2014 18:33    Microbiology: No results found for this or any previous visit (from the past 240 hour(s)).   Labs: Basic Metabolic Panel:  Recent Labs Lab 01/12/14 1756 01/13/14 0240  NA 141 141  K 4.1 3.7  CL 104 104  CO2 25 24  GLUCOSE 188* 113*  BUN 13 14  CREATININE 0.93 0.75  CALCIUM 8.8 8.9   Liver Function Tests:  Recent Labs Lab 01/12/14 1756 01/13/14 0240  AST 75* 69*  ALT 75* 72*   ALKPHOS 138* 109  BILITOT 1.6* 2.0*  PROT 6.8 6.9  ALBUMIN 2.8* 2.9*   No results found for this basename: LIPASE, AMYLASE,  in the last 168 hours  Recent Labs Lab 01/12/14 1756 01/13/14 1100  AMMONIA 146* 101*   CBC:  Recent Labs Lab 01/12/14 1756 01/13/14 0240  WBC 8.6 8.9  NEUTROABS 6.3  --   HGB 14.2 13.7  HCT 42.0 40.6  MCV 100.0 98.8  PLT 75* 75*   Cardiac Enzymes: No results found for this basename: CKTOTAL, CKMB, CKMBINDEX, TROPONINI,  in the last 168 hours BNP: BNP (last 3 results) No results found for this basename: PROBNP,  in the last 8760 hours CBG:  Recent Labs Lab 01/12/14 2311 01/13/14 0646 01/13/14 1102  GLUCAP 109* 94 191*       Signed:  Corine Solorio K  [facility] 01/13/2014, 7:39 PM

## 2014-01-13 NOTE — Progress Notes (Signed)
Patient and family given discharge instructions and all questions answered.  Patient discharged via wheelchair with all belongings.

## 2014-01-13 NOTE — Progress Notes (Signed)
UR completed Ayden Apodaca K. Divine Imber, RN, BSN, [place], CCM  01/13/2014 4:08 PM

## 2014-01-13 NOTE — Discharge Instructions (Signed)
Hepatic Encephalopathy Hepatic encephalopathy is a syndrome. This is a set of symptoms that occur together. It is seen mostly in patients with damage to the liver known as cirrhosis. This is where normal liver tissue has been replaced by scar tissue.  Symptoms of the syndrome include:  Changes in personality.  Mental impairment.  A depressed level of consciousness. These changes occur because toxins build up in the bloodstream. The build up occurs because the scarred liver cannot rid toxins from the body. The most important of these toxins is ammonia. Toxins can cause abnormal behavior and confusion. Toxins in the blood stream can impair your ability to take care of yourself or others. Some people become very sleepy and cannot be woken easily. In severe cases, the patient lapses into a coma.  CAUSES  There are many things that can cause liver damage that can lead to buildup of toxins. These include:  Diseases that cause cirrhosis of the liver.  Long-term alcohol use with progressive liver damage.  Hepatitis B or C with ongoing infection and liver damage.  Patients without cirrhosis who have undergone shunt surgery.  Kidney failure.  Bleeding in the stomach or intestines.  Infection.  Constipation.  Medications that act upon the central nervous system.  Diuretic therapy.  Excessive dietary protein. SYMPTOMS  Symptoms of this syndrome are categorized or "staged" based on severity.   Stage 0. Minimal hepatic encephalopathy. No detectable changes in personality or behavior. Minimal changes in memory, concentration, mental function, and physical ability.  Stage 1. Some lack of awareness. Shortened attention span. Problems with addition or subtraction. Possible problems with sleeping or a reversal of the normal sleep pattern. Euphoria, depression, or irritability may be present. Mild confusion. Slowing of mental ability. Tremors may be detected.  Stage 2. Lethargy or apathy.  Disoriented. Strange behavior. Slurred speech. Obvious tremors. Drowsiness, unable to perform mental tasks. Personality changes, and confusion about time.  Stage 3. Very sleepy but can be aroused. Unable to perform mental tasks, cannot keep track of time and place, marked confusion, amnesia, occasional fits of rage, speech cannot be understood.  Stage 4. Coma with or without response to painful stimuli. DIAGNOSIS  In mild cases, a careful history and physical exam may lead your caregiver to consider possible mild hepatic encephalopathy as the cause of symptoms. The diagnosis is clearer in more severe cases. An elevated blood ammonia level is the classic blood test abnormality in patients with this syndrome. Other tests can be helpful to rule out other diseases.  TREATMENT   Medications are often used to lower the ammonia level in the blood. This usually leads to improvement.  Diets containing vegetable proteins are better than diets rich in animal protein, especially proteins derived from red meats. Eating well-cooked chicken and fish in addition to vegetable protein should be discussed with your caregiver. Malnourished patients are encouraged to add liquid nutritional supplements to their diet.  Antibiotics are sometimes used to try to lessen the volume of bacteria in the intestines that produce ammonia.  Moderate to severe cases of this syndrome usually require a hospital stay and medicine that is given directly into a vein (intravenously). HOME CARE INSTRUCTIONS  The goal at home is to avoid things that can make the condition worse and lead to a buildup of ammonia in the blood.  Eat a well balanced diet. Your caregiver can help you with suggestions on this.  Talk to your caregiver before taking vitamin supplements. Large doses of vitamins and minerals,  especially vitamin A, iron, or copper, can worsen liver damage.  A low salt diet, water restriction, or diuretic medicine may be needed to  reduce fluid retention.  Avoid alcohol and acetaminophen as well as any over-the-counter medications that contain acetaminophen (check labels). Only take over-the-counter or prescription medicines for pain, discomfort, or fever as directed by your caregiver.  Avoid drugs that are toxic to the liver. Review your medications (both prescription and non-prescription) with your caregiver to make sure those you are taking will not be harmful.  Blood tests may be needed. Follow your caregiver's advice regarding the timing of these.  With this condition you play a critical role in maintaining your own good health. The failure to follow your caregiver's advice and these instructions may result in permanent disability or death. SEEK MEDICAL CARE IF:   You have increasing fatigue or weakness.  You develop increasing swelling of the abdomen, hands, feet, legs or face.  You develop loss of appetite.  You are feeling sick to your stomach (nausea) and vomiting.  You develop jaundice. This is a yellow discoloration of the skin.  You develop worsening problems with concentration, confusion, and/or problems with sleep. SEEK IMMEDIATE MEDICAL CARE IF:   You vomit bright red blood or a coffee ground-looking material.  You have blood in your stools. Or the stools turn black and tarry.  You have a fever.  You develop easy bruising or bleeding.  You have a return of slurred speech, change in behavior, or confusion. MAKE SURE YOU:   Understand these instructions.  Will watch your condition.  Will get help right away if you are not doing well or get worse. Document Released: 09/06/2006 Document Revised: 09/19/2011 Document Reviewed: 06/13/2007 [facility] Patient Information 2015 [place], [place]. This information is not intended to replace advice given to you by your health care provider. Make sure you discuss any questions you have with your health care provider.

## 2014-01-13 NOTE — Care Management Note (Addendum)
  Page 1 of 1   01/14/2014     2:36:48 PM CARE MANAGEMENT NOTE 01/13/2014  Patient:  Adam Butler, Adam Butler   Account Number:  [id number]  Date Initiated:  01/13/2014  Documentation initiated by:  Mariann Laster  Subjective/Objective Assessment:   hepatic encephalopathy     Action/Plan:   CM to follow for dispositon needs   Anticipated DC Date:  01/14/2014   Anticipated DC Plan:  HOME/SELF CARE         Choice offered to / List presented to:             Status of service:  Completed, signed off Medicare Important Message given?   (If response is "NO", the following Medicare IM given date fields will be blank) Date Medicare IM given:   Medicare IM given by:   Date Additional Medicare IM given:   Additional Medicare IM given by:    Discharge Disposition:  HOME/SELF CARE  Per UR Regulation:  Reviewed for med. necessity/level of care/duration of stay  If discussed at [place] of Stay Meetings, dates discussed:    Comments:  Adam Hahne RN, BSN, MSHL, CCM  Nurse - Case Manager,  (Unit [place])  [phone]  01/13/2014 Initial IM by admissions on 01/12/2014 From home with wife Readmission hx d/t medication non-compliance, not taking meds, not getting refills or keeping MD appts. Dispositon Plan:  pending.

## 2014-02-13 ENCOUNTER — Telehealth: Payer: Self-pay | Admitting: Gastroenterology

## 2014-02-13 NOTE — Telephone Encounter (Signed)
Pt would like to switch care from Dr. Deatra Ina to Dr. Hilarie Fredrickson. Dr. Deatra Ina will you approve the switch? Please advise.

## 2014-02-13 NOTE — Telephone Encounter (Signed)
Pt is requesting to transfer care from Dr. Deatra Ina to Dr. Hilarie Fredrickson. He states he isn't satisfied with Dr. Kelby Fam care, and he recently saw Dr. Hilarie Fredrickson in the hospital, and would like to continue care with him.

## 2014-02-14 NOTE — Telephone Encounter (Signed)
OK 

## 2014-02-14 NOTE — Telephone Encounter (Signed)
Dr. Hilarie Fredrickson will you accept this pt? Please advise.

## 2014-02-14 NOTE — Telephone Encounter (Signed)
Yes, needs OV in 30 min slot

## 2014-02-14 NOTE — Telephone Encounter (Signed)
Please schedule this pt to see Dr. Hilarie Fredrickson, he states pt needs a 105mn slot.

## 2014-02-19 NOTE — Telephone Encounter (Signed)
Spoke to pt and scheduled with Dr. Hilarie Fredrickson.

## 2014-04-24 ENCOUNTER — Ambulatory Visit: Payer: Medicare Other | Admitting: Internal Medicine

## 2014-10-14 ENCOUNTER — Emergency Department (HOSPITAL_COMMUNITY): Payer: PPO

## 2014-10-14 ENCOUNTER — Encounter (HOSPITAL_COMMUNITY): Payer: Self-pay | Admitting: Emergency Medicine

## 2014-10-14 ENCOUNTER — Observation Stay (HOSPITAL_COMMUNITY)
Admission: EM | Admit: 2014-10-14 | Discharge: 2014-10-16 | Disposition: A | Discharge status: Home / Self Care | Payer: PPO | Attending: Internal Medicine | Admitting: Internal Medicine

## 2014-10-14 DIAGNOSIS — R1013 Epigastric pain: Secondary | ICD-10-CM

## 2014-10-14 DIAGNOSIS — I10 Essential (primary) hypertension: Secondary | ICD-10-CM | POA: Insufficient documentation

## 2014-10-14 DIAGNOSIS — D696 Thrombocytopenia, unspecified: Secondary | ICD-10-CM | POA: Diagnosis not present

## 2014-10-14 DIAGNOSIS — I509 Heart failure, unspecified: Secondary | ICD-10-CM | POA: Diagnosis not present

## 2014-10-14 DIAGNOSIS — K8020 Calculus of gallbladder without cholecystitis without obstruction: Secondary | ICD-10-CM | POA: Diagnosis not present

## 2014-10-14 DIAGNOSIS — I2582 Chronic total occlusion of coronary artery: Secondary | ICD-10-CM | POA: Diagnosis not present

## 2014-10-14 DIAGNOSIS — E785 Hyperlipidemia, unspecified: Secondary | ICD-10-CM | POA: Insufficient documentation

## 2014-10-14 DIAGNOSIS — E119 Type 2 diabetes mellitus without complications: Secondary | ICD-10-CM

## 2014-10-14 DIAGNOSIS — Z87891 Personal history of nicotine dependence: Secondary | ICD-10-CM | POA: Insufficient documentation

## 2014-10-14 DIAGNOSIS — E876 Hypokalemia: Secondary | ICD-10-CM | POA: Insufficient documentation

## 2014-10-14 DIAGNOSIS — Z8719 Personal history of other diseases of the digestive system: Secondary | ICD-10-CM | POA: Diagnosis not present

## 2014-10-14 DIAGNOSIS — Z794 Long term (current) use of insulin: Secondary | ICD-10-CM | POA: Insufficient documentation

## 2014-10-14 DIAGNOSIS — Z6841 Body Mass Index (BMI) 40.0 and over, adult: Secondary | ICD-10-CM | POA: Diagnosis not present

## 2014-10-14 DIAGNOSIS — K7460 Unspecified cirrhosis of liver: Secondary | ICD-10-CM | POA: Diagnosis not present

## 2014-10-14 DIAGNOSIS — Z7982 Long term (current) use of aspirin: Secondary | ICD-10-CM | POA: Insufficient documentation

## 2014-10-14 DIAGNOSIS — Z9861 Coronary angioplasty status: Secondary | ICD-10-CM | POA: Insufficient documentation

## 2014-10-14 DIAGNOSIS — R079 Chest pain, unspecified: Secondary | ICD-10-CM | POA: Diagnosis present

## 2014-10-14 DIAGNOSIS — J449 Chronic obstructive pulmonary disease, unspecified: Secondary | ICD-10-CM | POA: Diagnosis not present

## 2014-10-14 DIAGNOSIS — I2510 Atherosclerotic heart disease of native coronary artery without angina pectoris: Principal | ICD-10-CM | POA: Insufficient documentation

## 2014-10-14 DIAGNOSIS — I252 Old myocardial infarction: Secondary | ICD-10-CM | POA: Insufficient documentation

## 2014-10-14 HISTORY — DX: Unspecified osteoarthritis, unspecified site: M19.90

## 2014-10-14 HISTORY — DX: Acute myocardial infarction, unspecified: I21.9

## 2014-10-14 LAB — CBC WITH DIFFERENTIAL/PLATELET
BASOS PCT: 0 % (ref 0–1)
Basophils Absolute: 0 10*3/uL (ref 0.0–0.1)
EOS ABS: 0.1 10*3/uL (ref 0.0–0.7)
EOS PCT: 2 % (ref 0–5)
HCT: 40.6 % (ref 39.0–52.0)
HEMOGLOBIN: 13.7 g/dL (ref 13.0–17.0)
LYMPHS PCT: 15 % (ref 12–46)
Lymphs Abs: 1 10*3/uL (ref 0.7–4.0)
MCH: 33.8 pg (ref 26.0–34.0)
MCHC: 33.7 g/dL (ref 30.0–36.0)
MCV: 100.2 fL — ABNORMAL HIGH (ref 78.0–100.0)
MONO ABS: 0.6 10*3/uL (ref 0.1–1.0)
Monocytes Relative: 9 % (ref 3–12)
NEUTROS PCT: 74 % (ref 43–77)
Neutro Abs: 4.7 10*3/uL (ref 1.7–7.7)
Platelets: 54 10*3/uL — ABNORMAL LOW (ref 150–400)
RBC: 4.05 MIL/uL — AB (ref 4.22–5.81)
RDW: 13.8 % (ref 11.5–15.5)
WBC: 6.4 10*3/uL (ref 4.0–10.5)

## 2014-10-14 LAB — COMPREHENSIVE METABOLIC PANEL
ALT: 55 U/L — ABNORMAL HIGH (ref 0–53)
AST: 77 U/L — ABNORMAL HIGH (ref 0–37)
Albumin: 3 g/dL — ABNORMAL LOW (ref 3.5–5.2)
Alkaline Phosphatase: 133 U/L — ABNORMAL HIGH (ref 39–117)
Anion gap: 10 (ref 5–15)
BUN: 8 mg/dL (ref 6–23)
CALCIUM: 8.6 mg/dL (ref 8.4–10.5)
CO2: 21 mmol/L (ref 19–32)
CREATININE: 0.98 mg/dL (ref 0.50–1.35)
Chloride: 106 mmol/L (ref 96–112)
GFR calc non Af Amer: 89 mL/min — ABNORMAL LOW (ref >90)
GLUCOSE: 224 mg/dL — AB (ref 70–99)
Potassium: 3.9 mmol/L (ref 3.5–5.1)
Sodium: 137 mmol/L (ref 135–145)
Total Bilirubin: 1.9 mg/dL — ABNORMAL HIGH (ref 0.3–1.2)
Total Protein: 6.8 g/dL (ref 6.0–8.3)

## 2014-10-14 LAB — BRAIN NATRIURETIC PEPTIDE: B NATRIURETIC PEPTIDE 5: 56.1 pg/mL (ref 0.0–100.0)

## 2014-10-14 LAB — LIPASE, BLOOD: LIPASE: 49 U/L (ref 11–59)

## 2014-10-14 LAB — I-STAT TROPONIN, ED: Troponin i, poc: 0.01 ng/mL (ref 0.00–0.08)

## 2014-10-14 MED ORDER — MORPHINE SULFATE 4 MG/ML IJ SOLN
4.0000 mg | Freq: Once | INTRAMUSCULAR | Status: AC
  Administered 2014-10-14: 4 mg via INTRAVENOUS
  Filled 2014-10-14: qty 1

## 2014-10-14 MED ORDER — ONDANSETRON HCL 4 MG/2ML IJ SOLN
4.0000 mg | Freq: Once | INTRAMUSCULAR | Status: AC
  Administered 2014-10-14: 4 mg via INTRAVENOUS
  Filled 2014-10-14: qty 2

## 2014-10-14 NOTE — ED Provider Notes (Signed)
CSN: [id number]     Arrival date & time 10/14/14  2129 History   First MD Initiated Contact with Patient 10/14/14 2130     Chief Complaint  Patient presents with  . Chest Pain     (Consider location/radiation/quality/duration/timing/severity/associated sxs/prior Treatment) HPI Adam Butler is a 59 y.o. male with hx of liver cirossis, cad, CHF, DM, HTN, presents to ED with complaint of lower chest/upper abdominal pain. States pain started this evening just prior to calling EMS. Pt states pain associated with nausea, shortness of breath. He took SL nitro at home, which did not help. EMS gave him 372m asprin and another nitro which didn't help again. Pt states pain radiating into the back. Pt with tachypnea, VS normal otherwise per EMS. Pt states hx of prior MI, but multiple years ago. NO other complaints.   Past Medical History  Diagnosis Date  . Esophageal varices 04/2012  . Cirrhosis 2011    Cryptogenic, Likely NASH. Family/pt deny EtOH. HCV, HBV, HAV negative. ANA negative. AMA positive. Ascites 12/11  . Hyperlipemia   . Coronary artery disease     Inferior MI 12/11; LHC with occluded mid CFX and 80% proximal RCA. EF 55%. He had 3.0 x 28 vision BMS to CFX  . Diastolic CHF, acute     Echo 12/11 with ef 50-55% and mild LVH. EF 55% by LV0gram in 12/11  . Hypertension   . Type II diabetes mellitus 2011  . SVT (supraventricular tachycardia)     1/12: appeared to be an ectopic atrial tachycardia. Required DCCV with hemodynamic instability  . GI bleed     12/11: Etiology not clearly defined. EGD: nonbleeding esophageal varices.  . S/P coronary artery stent placement 06/2010  . Esophageal varices 2011, 2013    no hx acute variceal bleed  . Hepatic encephalopathy 2011, 12/2013   Past Surgical History  Procedure Laterality Date  . Cardiac catheterization  07/01/2010    BMS to CFX.  .Marland KitchenAppendectomy    . Orif r leg    . Esophagogastroduodenoscopy  04/13/2012    Procedure:  ESOPHAGOGASTRODUODENOSCOPY (EGD);  Surgeon: RInda Castle MD;  [facility];  Service: Endoscopy;  Laterality: N/A;  . Colonoscopy  04/13/2012    Procedure: COLONOSCOPY;  Surgeon: RInda Castle MD;  Location: [facility];  Service: Endoscopy;  Laterality: N/A;  . Esophagogastroduodenoscopy N/A 01/01/2014    Procedure: ESOPHAGOGASTRODUODENOSCOPY (EGD);  Surgeon: JJerene Bears MD;  [facility];  Service: Endoscopy;  Laterality: N/A;  . Esophageal banding N/A 01/01/2014    Procedure: ESOPHAGEAL BANDING;  Surgeon: JJerene Bears MD;  [facility];  Service: Endoscopy;  Laterality: N/A;   Family History  Problem Relation Age of Onset  . Heart attack Brother 570   MI  . Heart attack Brother 340   MI  . Heart attack Father   . Diabetes Father   . Diabetes Brother   . COPD Mother    History  Substance Use Topics  . Smoking status: Former Smoker -- 1.00 packs/day for 36 years    Types: Cigarettes    Quit date: 06/03/2013  . Smokeless tobacco: Former [employer] developer . Alcohol Use: No    Review of Systems  Constitutional: Negative for fever and chills.  Respiratory: Negative for cough, chest tightness and shortness of breath.   Cardiovascular: Positive for chest pain. Negative for palpitations and leg swelling.  Gastrointestinal: Positive for nausea and abdominal pain. Negative for vomiting, diarrhea and  abdominal distention.  Genitourinary: Negative for dysuria, urgency, frequency and hematuria.  Musculoskeletal: Negative for myalgias, arthralgias, neck pain and neck stiffness.  Skin: Negative for rash.  Allergic/Immunologic: Negative for immunocompromised state.  Neurological: Positive for dizziness and light-headedness. Negative for weakness, numbness and headaches.  All other systems reviewed and are negative.     Allergies  Sulfa antibiotics  Home Medications   Prior to Admission medications   Medication Sig Start Date End Date Taking? Authorizing  Provider  aspirin 81 MG tablet Take 81 mg by mouth daily.     Historical Provider, MD  enalapril (VASOTEC) 20 MG tablet Take 20 mg by mouth daily.    Historical Provider, MD  furosemide (LASIX) 40 MG tablet Take 20 mg by mouth daily. 01/09/12   Larey Dresser, MD  insulin NPH Human (HUMULIN N,NOVOLIN N) 100 UNIT/ML injection Inject 20-80 Units into the skin See admin instructions. Take 80 units in the morning and 50 units in the evening.    Historical Provider, MD  lactulose (CHRONULAC) 10 GM/15ML solution Take 30 mLs (20 g total) by mouth 3 (three) times daily. 01/13/14   Annita Brod, MD  Lactulose POWD 30 ml po TID.  Increase as needed if confusion persists.  (Up to 45 four times a day) 01/13/14   Annita Brod, MD  metFORMIN (GLUCOPHAGE) 500 MG tablet Take 1,000 mg by mouth 2 (two) times daily.     Historical Provider, MD  Multiple Vitamin (MULTIVITAMIN) capsule Take 1 capsule by mouth daily.      Historical Provider, MD  nadolol (CORGARD) 20 MG tablet Take 1 tablet (20 mg total) by mouth daily. 01/01/14   Sheila Oats, MD  nitroGLYCERIN (NITROSTAT) 0.4 MG SL tablet Place 1 tablet (0.4 mg total) under the tongue every 5 (five) minutes as needed. 01/09/12   Larey Dresser, MD  omeprazole (PRILOSEC) 40 MG capsule Take 1 capsule (40 mg total) by mouth daily. 01/01/14   Sheila Oats, MD  rifaximin (XIFAXAN) 550 MG TABS tablet Take 1 tablet (550 mg total) by mouth 2 (two) times daily. 01/01/14   Sheila Oats, MD   BP 119/63 mmHg  Pulse 75  Temp(Src) 97.8 F (36.6 C) (Oral)  Resp 24  Ht 5' 7"  (1.702 m)  Wt 289 lb (131.09 kg)  BMI 45.25 kg/m2  SpO2 99% Physical Exam  Constitutional: He appears well-developed and well-nourished. No distress.  Obese  HENT:  Head: Normocephalic and atraumatic.  Eyes: Conjunctivae are normal.  Neck: Neck supple.  Cardiovascular: Normal rate, regular rhythm and normal heart sounds.   Pulmonary/Chest: Effort normal and breath sounds normal. No  respiratory distress. He has no wheezes. He has no rales. He exhibits no tenderness.  Abdominal: Soft. Bowel sounds are normal. He exhibits no distension. There is tenderness. There is no rebound.  Left upper quadrant tenderness, epigastric tenderness  Musculoskeletal: He exhibits edema.  Neurological: He is alert.  Skin: Skin is warm and dry.  Nursing note and vitals reviewed.   ED Course  Procedures (including critical care time) Labs Review Labs Reviewed  CBC WITH DIFFERENTIAL/PLATELET - Abnormal; Notable for the following:    RBC 4.05 (*)    MCV 100.2 (*)    Platelets 54 (*)    All other components within normal limits  COMPREHENSIVE METABOLIC PANEL - Abnormal; Notable for the following:    Glucose, Bld 224 (*)    Albumin 3.0 (*)    AST 77 (*)  ALT 55 (*)    Alkaline Phosphatase 133 (*)    Total Bilirubin 1.9 (*)    GFR calc non Af Amer 89 (*)    All other components within normal limits  LIPASE, BLOOD  BRAIN NATRIURETIC PEPTIDE  I-STAT TROPOININ, ED    Imaging Review Dg Chest 2 View  10/14/2014   CLINICAL DATA:  Pain across the chest and shortness of breath tonight.  EXAM: CHEST  2 VIEW  COMPARISON:  01/12/2014  FINDINGS: The heart size and mediastinal contours are within normal limits. Both lungs are clear. The visualized skeletal structures are unremarkable.  IMPRESSION: No active cardiopulmonary disease.   Electronically Signed   By: Lucienne Capers M.D.   On: 10/14/2014 22:47   US Abdomen Complete  10/15/2014   CLINICAL DATA:  Chest pain. Diffuse abdominal pain in the right upper quadrant and left upper quadrant and midline. History of nonalcoholic cirrhosis. Diabetes and CHF.  EXAM: ULTRASOUND ABDOMEN COMPLETE  COMPARISON:  CT abdomen and pelvis 12/29/2013  FINDINGS: Gallbladder: Gallbladder is mildly distended with mild diffuse wall thickening. Small stones layering in the gallbladder neck. Murphy's sign is negative.  Common bile duct: Diameter: 5.9 mm, normal  Liver:  Heterogeneous parenchymal echotexture pattern with nodular contour consistent with history of cirrhosis. Limited color flow Doppler imaging of the main portal vein shows flow in the appropriate direction. Recanalized periumbilical vein is noted.  IVC: Not visualized.  Pancreas: Not visualized.  Spleen: Diffusely enlarged spleen. Volume calculates to 828 mL. Prominent vascular structures in the splenic hilum likely representing varices.  Right Kidney: Length: 11.9 cm. Echogenicity within normal limits. No mass or hydronephrosis visualized.  Left Kidney: Length: 12.5 cm. Limited visualization. Echogenicity within normal limits. No mass or hydronephrosis visualized.  Abdominal aorta: No aneurysm visualized.  Other findings: None.  IMPRESSION: Cholelithiasis with mild gallbladder distention and wall thickening. Murphy's sign is negative. This is nonspecific for acute cholecystitis. Changes of hepatic cirrhosis with portal venous hypertension including splenic enlargement, splenic vein varices, and recanalized periumbilical vein.   Electronically Signed   By: Lucienne Capers M.D.   On: 10/15/2014 00:47     EKG Interpretation   Date/Time:  Tuesday October 14 2014 21:42:00 EDT Ventricular Rate:  76 PR Interval:  148 QRS Duration: 99 QT Interval:  429 QTC Calculation: 482 R Axis:   -8 Text Interpretation:  Sinus rhythm Probable left atrial enlargement  Inferior infarct, old Confirmed by POLLINA  MD, CHRISTOPHER [phone]) on  10/14/2014 11:04:24 PM      MDM   Final diagnoses:  Chest pain, unspecified chest pain type  Epigastric pain    Patient with prior history of MI with stenting, prior history of liver cirrhosis and portal hypertension presents to emergency department with left-sided chest pain and left upper abdominal pain. He reports some nausea and shortness of breath. Symptoms occurred few hours prior to coming in. Will get labs including lipase and LFTs, chest x-ray, ultrasound abdomen for  evaluation of patient's aorta, gallbladder, ascites.  1:12 AM Patient's lab work is remarkable for elevated LFTs, history of the same. Glucose of 224. Chest x-ray unremarkable. Troponin negative. Ultrasound showing gallstones with possible gallbladder wall thickening and an distention. There was negative Murphy sign. At this time patient has no right upper quadrant tenderness, I do not think this is acute cholecystitis but will need reassessment. I discussed patient with tried hospitalist they will admit.  Filed Vitals:   10/14/14 2145 10/14/14 2153 10/14/14 2200 10/14/14 2230  BP: 119/63 119/63 127/60 140/52  Pulse: 78 75 73 73  Temp:  97.8 F (36.6 C)    TempSrc:  Oral    Resp: 16 24 17 17   Height:  5' 7"  (1.702 m)    Weight:  289 lb (131.09 kg)    SpO2: 99% 99% 99% 100%     Jeannett Senior, PA-C 10/15/14 0114  Orpah Greek, MD 10/17/14 1254

## 2014-10-14 NOTE — ED Notes (Signed)
Per EMS patient called EMS to home due to chest pain that nitro did not help. Complains of shortness of breath with nausea and pain from lower chest to back. Denies vomiting and diaphoresis.  BP 125/48 HR 78 97%  Given 324 mg asa 4 mg nitro

## 2014-10-14 NOTE — ED Notes (Signed)
Per PA, took pt BP in both left and right arms. Pt BP in right arm 129/64. Pt BP in left arm 135/57.

## 2014-10-15 ENCOUNTER — Observation Stay (HOSPITAL_COMMUNITY): Payer: PPO

## 2014-10-15 ENCOUNTER — Encounter (HOSPITAL_COMMUNITY): Payer: Self-pay | Admitting: Internal Medicine

## 2014-10-15 ENCOUNTER — Encounter (HOSPITAL_COMMUNITY)
Admission: EM | Disposition: A | Discharge status: Home / Self Care | Payer: Self-pay | Source: Home / Self Care | Attending: Internal Medicine

## 2014-10-15 DIAGNOSIS — R079 Chest pain, unspecified: Secondary | ICD-10-CM | POA: Diagnosis present

## 2014-10-15 DIAGNOSIS — K8020 Calculus of gallbladder without cholecystitis without obstruction: Secondary | ICD-10-CM

## 2014-10-15 DIAGNOSIS — K7460 Unspecified cirrhosis of liver: Secondary | ICD-10-CM | POA: Diagnosis not present

## 2014-10-15 DIAGNOSIS — D696 Thrombocytopenia, unspecified: Secondary | ICD-10-CM | POA: Diagnosis present

## 2014-10-15 DIAGNOSIS — I2510 Atherosclerotic heart disease of native coronary artery without angina pectoris: Principal | ICD-10-CM

## 2014-10-15 DIAGNOSIS — E119 Type 2 diabetes mellitus without complications: Secondary | ICD-10-CM

## 2014-10-15 HISTORY — PX: LEFT HEART CATHETERIZATION WITH CORONARY ANGIOGRAM: SHX5451

## 2014-10-15 LAB — HEPATIC FUNCTION PANEL
ALT: 52 U/L (ref 0–53)
AST: 69 U/L — ABNORMAL HIGH (ref 0–37)
Albumin: 2.9 g/dL — ABNORMAL LOW (ref 3.5–5.2)
Alkaline Phosphatase: 104 U/L (ref 39–117)
Bilirubin, Direct: 0.4 mg/dL (ref 0.0–0.5)
Indirect Bilirubin: 1.3 mg/dL — ABNORMAL HIGH (ref 0.3–0.9)
Total Bilirubin: 1.7 mg/dL — ABNORMAL HIGH (ref 0.3–1.2)
Total Protein: 6.3 g/dL (ref 6.0–8.3)

## 2014-10-15 LAB — PROTIME-INR
INR: 1.2 (ref 0.00–1.49)
Prothrombin Time: 15.3 seconds — ABNORMAL HIGH (ref 11.6–15.2)

## 2014-10-15 LAB — BASIC METABOLIC PANEL WITH GFR
Anion gap: 8 (ref 5–15)
BUN: 8 mg/dL (ref 6–23)
CO2: 23 mmol/L (ref 19–32)
Calcium: 8.6 mg/dL (ref 8.4–10.5)
Chloride: 106 mmol/L (ref 96–112)
Creatinine, Ser: 0.78 mg/dL (ref 0.50–1.35)
GFR calc Af Amer: >90 mL/min
GFR calc non Af Amer: >90 mL/min
Glucose, Bld: 78 mg/dL (ref 70–99)
Potassium: 3.3 mmol/L — ABNORMAL LOW (ref 3.5–5.1)
Sodium: 137 mmol/L (ref 135–145)

## 2014-10-15 LAB — CBC WITH DIFFERENTIAL/PLATELET
Basophils Absolute: 0 10*3/uL (ref 0.0–0.1)
Basophils Relative: 0 % (ref 0–1)
Eosinophils Absolute: 0.3 10*3/uL (ref 0.0–0.7)
Eosinophils Relative: 4 % (ref 0–5)
HEMATOCRIT: 38.9 % — AB (ref 39.0–52.0)
Hemoglobin: 13.3 g/dL (ref 13.0–17.0)
LYMPHS PCT: 28 % (ref 12–46)
Lymphs Abs: 1.6 10*3/uL (ref 0.7–4.0)
MCH: 33.8 pg (ref 26.0–34.0)
MCHC: 34.2 g/dL (ref 30.0–36.0)
MCV: 99 fL (ref 78.0–100.0)
MONO ABS: 0.7 10*3/uL (ref 0.1–1.0)
Monocytes Relative: 12 % (ref 3–12)
Neutro Abs: 3.2 10*3/uL (ref 1.7–7.7)
Neutrophils Relative %: 56 % (ref 43–77)
PLATELETS: 55 10*3/uL — AB (ref 150–400)
RBC: 3.93 MIL/uL — ABNORMAL LOW (ref 4.22–5.81)
RDW: 13.8 % (ref 11.5–15.5)
WBC: 5.7 10*3/uL (ref 4.0–10.5)

## 2014-10-15 LAB — GLUCOSE, CAPILLARY
GLUCOSE-CAPILLARY: 120 mg/dL — AB (ref 70–99)
GLUCOSE-CAPILLARY: 113 mg/dL — AB (ref 70–99)
GLUCOSE-CAPILLARY: 104 mg/dL — AB (ref 70–99)
GLUCOSE-CAPILLARY: 209 mg/dL — AB (ref 70–99)
Glucose-Capillary: 77 mg/dL (ref 70–99)
Glucose-Capillary: 102 mg/dL — ABNORMAL HIGH (ref 70–99)

## 2014-10-15 LAB — TROPONIN I
TROPONIN I: 0.04 ng/mL — AB (ref <0.031)
TROPONIN I: 0.04 ng/mL — AB (ref <0.031)

## 2014-10-15 LAB — LIPASE, BLOOD: Lipase: 254 U/L — ABNORMAL HIGH (ref 11–59)

## 2014-10-15 SURGERY — LEFT HEART CATHETERIZATION WITH CORONARY ANGIOGRAM

## 2014-10-15 MED ORDER — INSULIN NPH (HUMAN) (ISOPHANE) 100 UNIT/ML ~~LOC~~ SUSP
20.0000 [IU] | Freq: Two times a day (BID) | SUBCUTANEOUS | Status: DC
  Administered 2014-10-15: 20 [IU] via SUBCUTANEOUS
  Filled 2014-10-15: qty 10

## 2014-10-15 MED ORDER — ASPIRIN EC 81 MG PO TBEC
81.0000 mg | DELAYED_RELEASE_TABLET | Freq: Every day | ORAL | Status: DC
  Administered 2014-10-16: 81 mg via ORAL
  Filled 2014-10-15 (×2): qty 1

## 2014-10-15 MED ORDER — MORPHINE SULFATE 2 MG/ML IJ SOLN: 2.0000 mg | INTRAMUSCULAR | Status: DC | PRN

## 2014-10-15 MED ORDER — SODIUM CHLORIDE 0.9 % IJ SOLN
3.0000 mL | Freq: Two times a day (BID) | INTRAMUSCULAR | Status: DC
  Administered 2014-10-15: 3 mL via INTRAVENOUS

## 2014-10-15 MED ORDER — ATENOLOL 50 MG PO TABS
50.0000 mg | ORAL_TABLET | Freq: Every day | ORAL | Status: DC
  Administered 2014-10-15 – 2014-10-16 (×2): 50 mg via ORAL
  Filled 2014-10-15 (×2): qty 1

## 2014-10-15 MED ORDER — ADULT MULTIVITAMIN W/MINERALS CH
1.0000 | ORAL_TABLET | Freq: Every day | ORAL | Status: DC
  Administered 2014-10-15 – 2014-10-16 (×2): 1 via ORAL
  Filled 2014-10-15 (×2): qty 1

## 2014-10-15 MED ORDER — HEPARIN SODIUM (PORCINE) 5000 UNIT/ML IJ SOLN
5000.0000 [IU] | Freq: Three times a day (TID) | INTRAMUSCULAR | Status: DC
  Administered 2014-10-16 (×2): 5000 [IU] via SUBCUTANEOUS
  Filled 2014-10-15 (×3): qty 1

## 2014-10-15 MED ORDER — LACTULOSE 10 GM/15ML PO SOLN
20.0000 g | Freq: Two times a day (BID) | ORAL | Status: DC
  Administered 2014-10-16: 20 g via ORAL
  Filled 2014-10-15 (×4): qty 30

## 2014-10-15 MED ORDER — VERAPAMIL HCL 2.5 MG/ML IV SOLN
INTRAVENOUS | Status: AC
  Filled 2014-10-15: qty 2

## 2014-10-15 MED ORDER — INSULIN ASPART 100 UNIT/ML ~~LOC~~ SOLN: 0.0000 [IU] | Freq: Three times a day (TID) | SUBCUTANEOUS | Status: DC

## 2014-10-15 MED ORDER — MIDAZOLAM HCL 2 MG/2ML IJ SOLN
INTRAMUSCULAR | Status: AC
Start: 2014-10-15 — End: 2014-10-15
  Filled 2014-10-15: qty 2

## 2014-10-15 MED ORDER — HEPARIN SODIUM (PORCINE) 1000 UNIT/ML IJ SOLN
INTRAMUSCULAR | Status: AC
Start: 2014-10-15 — End: 2014-10-15
  Filled 2014-10-15: qty 1

## 2014-10-15 MED ORDER — SODIUM CHLORIDE 0.9 % IV SOLN: 250.0000 mL | INTRAVENOUS | Status: DC | PRN

## 2014-10-15 MED ORDER — NITROGLYCERIN 1 MG/10 ML FOR IR/CATH LAB
INTRA_ARTERIAL | Status: AC
Start: 2014-10-15 — End: 2014-10-15
  Filled 2014-10-15: qty 10

## 2014-10-15 MED ORDER — INSULIN NPH (HUMAN) (ISOPHANE) 100 UNIT/ML ~~LOC~~ SUSP
40.0000 [IU] | Freq: Two times a day (BID) | SUBCUTANEOUS | Status: DC
  Filled 2014-10-15: qty 10

## 2014-10-15 MED ORDER — SODIUM CHLORIDE 0.9 % IV SOLN
1.0000 mL/kg/h | INTRAVENOUS | Status: AC
  Administered 2014-10-15 (×2): 1 mL/kg/h via INTRAVENOUS

## 2014-10-15 MED ORDER — DEXTROSE 50 % IV SOLN
1.0000 | Freq: Once | INTRAVENOUS | Status: AC
  Administered 2014-10-15: 50 mL via INTRAVENOUS
  Filled 2014-10-15: qty 50

## 2014-10-15 MED ORDER — FENTANYL CITRATE 0.05 MG/ML IJ SOLN
INTRAMUSCULAR | Status: AC
  Filled 2014-10-15: qty 2

## 2014-10-15 MED ORDER — NITROGLYCERIN 0.4 MG SL SUBL: 0.4000 mg | SUBLINGUAL_TABLET | SUBLINGUAL | Status: DC | PRN

## 2014-10-15 MED ORDER — ACETAMINOPHEN 325 MG PO TABS: 650.0000 mg | ORAL_TABLET | ORAL | Status: DC | PRN

## 2014-10-15 MED ORDER — ENALAPRIL MALEATE 20 MG PO TABS
20.0000 mg | ORAL_TABLET | Freq: Every day | ORAL | Status: DC
  Administered 2014-10-15 – 2014-10-16 (×2): 20 mg via ORAL
  Filled 2014-10-15 (×2): qty 1

## 2014-10-15 MED ORDER — SODIUM CHLORIDE 0.9 % IJ SOLN: 3.0000 mL | INTRAMUSCULAR | Status: DC | PRN

## 2014-10-15 MED ORDER — ONDANSETRON HCL 4 MG/2ML IJ SOLN: 4.0000 mg | Freq: Four times a day (QID) | INTRAMUSCULAR | Status: DC | PRN

## 2014-10-15 MED ORDER — HEPARIN (PORCINE) IN NACL 2-0.9 UNIT/ML-% IJ SOLN
INTRAMUSCULAR | Status: AC
  Filled 2014-10-15: qty 1000

## 2014-10-15 MED ORDER — LIDOCAINE HCL (PF) 1 % IJ SOLN
INTRAMUSCULAR | Status: AC
  Filled 2014-10-15: qty 30

## 2014-10-15 MED ORDER — ASPIRIN 81 MG PO CHEW
81.0000 mg | CHEWABLE_TABLET | ORAL | Status: AC
  Administered 2014-10-15: 81 mg via ORAL
  Filled 2014-10-15: qty 1

## 2014-10-15 MED ORDER — DEXTROSE 50 % IV SOLN
INTRAVENOUS | Status: AC
  Administered 2014-10-15: 50 mL
  Filled 2014-10-15: qty 50

## 2014-10-15 MED ORDER — SODIUM CHLORIDE 0.9 % IV SOLN
INTRAVENOUS | Status: DC
  Administered 2014-10-15: 11:00:00 via INTRAVENOUS

## 2014-10-15 MED ORDER — FUROSEMIDE 40 MG PO TABS
40.0000 mg | ORAL_TABLET | Freq: Every day | ORAL | Status: DC
  Administered 2014-10-15 – 2014-10-16 (×2): 40 mg via ORAL
  Filled 2014-10-15 (×2): qty 1

## 2014-10-15 NOTE — Interval H&P Note (Signed)
History and Physical Interval Note:  10/15/2014 2:52 PM  Adam Butler  has presented today for surgery, with the diagnosis of cp  The various methods of treatment have been discussed with the patient and family. After consideration of risks, benefits and other options for treatment, the patient has consented to  Procedure(s): LEFT HEART CATHETERIZATION WITH CORONARY ANGIOGRAM (N/A) as a surgical intervention .  The patient's history has been reviewed, patient examined, no change in status, stable for surgery.  I have reviewed the patient's chart and labs.  Questions were answered to the patient's satisfaction.   Cath Lab Visit (complete for each Cath Lab visit)  Clinical Evaluation Leading to the Procedure:   ACS: Yes.    Non-ACS:    Anginal Classification: CCS IV  Anti-ischemic medical therapy: Minimal Therapy (1 class of medications)  Non-Invasive Test Results: No non-invasive testing performed  Prior CABG: No previous CABG        Adam Butler [facility] 10/15/2014 2:52 PM

## 2014-10-15 NOTE — Progress Notes (Addendum)
Post cath, after all 13cc air had been deflated from TR Band, pt's right radial began to bleed at Level 1.  Per protocol, I have re-inflated the TR Band with 3cc air.  Paged Dr. Neita Garnet to notify.  Am waiting for response.  Pt's VSS, and pt is resting. Also, paged Dr. Mare Ferrari, as he responded and I informed him of the same; therefore, MD is aware.  No further orders at this time.  I will report to PM nurse and ask to monitor per protocol.

## 2014-10-15 NOTE — Progress Notes (Signed)
Patient ID: Adam Butler, male   DOB: March 29, 1956, 59 y.o.   MRN: [id number]  [facility] PROGRESS NOTE  KAYSAN PEIXOTO WFU:[id number] DOB: [birth year] DOA: 10/14/2014 PCP: Antonietta Jewel, MD   Brief narrative:    Pt admitted after midnight, this is the addendum to the admission note, please see full H&P by Dr. Delford Field.   Pt is 59 yo male with known history of CAD status post stenting, cryptogenic cirrhosis of liver, diabetes mellitus type 2, tobacco abuse, HTN, who presented to [facility] ED with chest pain in the mid sternal area and radiating to the shoulders, back and upper abd area. This has been associated with exertional dyspnea and even some dyspnea at rest over the past 24 hours.   In ED, troponin mildly elevated 0.04, abd [place] notable for cholelithiasis with no sings of cholecystitis. Cariology team has been consulted and [facility] asked to admit for further evaluation.   Assessment/Plan:    Principal Problem:   Chest pain in the setting of ischemic hear disease and s/p inferior MI in 06/2010 - appreciate cariology team following, cath planned for today    Cholelithiasis - hepatobiliary scan to be done after cath and possibly in the AM    Transaminitis with elevated lipase level  - ? Acute pancreatitis - unclear etiology - HIDA scan to be done after cath - repeat lipase and CMET in AM   Nonalcoholic cirrhosis with portal venous hypertension - with splenomegaly and splenic vein varices, a/p esophageal banding by Dr. Hilarie Fredrickson in 2015. Active Problems:   Thrombocytopenia - this is chronic problem, plt at baseline 50 - 60 K - no sings of active bleeding - repeat CBC in AM   Diabetes mellitus type 2, with complications of CAD - last A1C 12/2013 was > 8 - will repeat A1c with next blood work - place on home regimen at 50% dose as pt is NPO for now and add SSI if needed - pt had hypoglycemic event earlier but was on higher dose of insulin    HTN - SBP in 10's - currently on  atenolol, Lisinopril, Lasix - close monitoring of BP as may need ot hold some antihypertensive regimen    Hypokalemia - mild, will supplement and repeat BMP in AM   Morbid obesity  - Body mass index is 43.74 kg/(m^2).  DVT prophylaxis - Heparin SQ  Code Status: Full.  Family Communication:  plan of care discussed with the patient Disposition Plan: Home when stable.   IV access:  Peripheral IV  Procedures and diagnostic studies:    Dg Chest 2 View  10/14/2014  No active cardiopulmonary disease.   US Abdomen Complete  10/15/2014 Cholelithiasis with mild gallbladder distention and wall thickening. Murphy's sign is negative. This is nonspecific for acute cholecystitis. Changes of hepatic cirrhosis with portal venous hypertension including splenic enlargement, splenic vein varices, and recanalized periumbilical vein.     Medical Consultants:  Cardiology   Other Consultants:  None   IAnti-Infectives:   None   Faye Ramsay, MD  [facility] Pager [phone]  If 7PM-7AM, please contact night-coverage [email or web address] Password [email or web address] 10/15/2014, 4:11 PM   LOS: 0 days   HPI/Subjective: No events overnight.   Objective: Filed Vitals:   10/15/14 0757 10/15/14 1145 10/15/14 1430 10/15/14 1452  BP: 122/68 109/59 119/49   Pulse: 76 72 61 63  Temp: 97.8 F (36.6 C)  98 F (36.7 C)   TempSrc: Oral  Oral   Resp: 20  20   Height:  5' 7.5" (1.715 m)     Weight: 128.64 kg (283 lb 9.6 oz)     SpO2: 97%  100%     Intake/Output Summary (Last 24 hours) at 10/15/14 1611 Last data filed at 10/15/14 1142  Gross per 24 hour  Intake     30 ml  Output      0 ml  Net     30 ml    Exam:   General:  Pt is alert, follows commands appropriately, not in acute distress  Cardiovascular: Regular rate and rhythm, S1/S2, no murmurs, no rubs, no gallops  Respiratory: Clear to auscultation bilaterally, no wheezing, no crackles, no rhonchi  Abdomen: Soft, non tender, non distended, bowel sounds  present, no guarding  Extremities: pulses DP and PT palpable bilaterally  Data Reviewed: Basic Metabolic Panel:  Recent Labs Lab 10/14/14 2248 10/15/14 0735  NA 137 137  K 3.9 3.3*  CL 106 106  CO2 21 23  GLUCOSE 224* 78  BUN 8 8  CREATININE 0.98 0.78  CALCIUM 8.6 8.6   Liver Function Tests:  Recent Labs Lab 10/14/14 2248 10/15/14 0735  AST 77* 69*  ALT 55* 52  ALKPHOS 133* 104  BILITOT 1.9* 1.7*  PROT 6.8 6.3  ALBUMIN 3.0* 2.9*    Recent Labs Lab 10/14/14 2248 10/15/14 0735  LIPASE 49 254*   CBC:  Recent Labs Lab 10/14/14 2248 10/15/14 0735  WBC 6.4 5.7  NEUTROABS 4.7 3.2  HGB 13.7 13.3  HCT 40.6 38.9*  MCV 100.2* 99.0  PLT 54* 55*   Cardiac Enzymes:  Recent Labs Lab 10/15/14 0630 10/15/14 0755  TROPONINI 0.04* 0.04*   BNP: Invalid input(s): POCBNP CBG:  Recent Labs Lab 10/15/14 0811 10/15/14 1009 10/15/14 1204 10/15/14 1433  GLUCAP 77 120* 113* 104*     Scheduled Meds: . aspirin EC  81 mg Oral Daily  . atenolol  50 mg Oral Daily  . enalapril  20 mg Oral Daily  . furosemide  40 mg Oral Daily  . [START ON 10/16/2014] heparin  5,000 Units Subcutaneous 3 times per day  . insulin aspart  0-9 Units Subcutaneous TID WC  . insulin NPH Human  40 Units Subcutaneous BID AC & HS  . lactulose  20 g Oral BID  . multivitamin with minerals  1 tablet Oral Daily   Continuous Infusions: . sodium chloride 1 mL/kg/hr (10/15/14 1603)

## 2014-10-15 NOTE — H&P (View-Only) (Signed)
CARDIOLOGY CONSULT NOTE   Patient ID: Adam Butler MRN: [id number], DOB/AGE: 59/18/1957   Admit date: 10/14/2014 Date of Consult: 10/15/2014   Primary Physician: Antonietta Jewel, MD Primary Cardiologist: Dr. Aundra Dubin  Pt. Profile  59 year old gentleman admitted with chest pain.  History of prior inferior wall myocardial infarction in 2011.  Problem List  Past Medical History  Diagnosis Date  . Esophageal varices 04/2012  . Cirrhosis 2011    Cryptogenic, Likely NASH. Family/pt deny EtOH. HCV, HBV, HAV negative. ANA negative. AMA positive. Ascites 12/11  . Hyperlipemia   . Coronary artery disease     Inferior MI 12/11; LHC with occluded mid CFX and 80% proximal RCA. EF 55%. He had 3.0 x 28 vision BMS to CFX  . Diastolic CHF, acute     Echo 12/11 with ef 50-55% and mild LVH. EF 55% by LV0gram in 12/11  . Hypertension   . Type II diabetes mellitus 2011  . SVT (supraventricular tachycardia)     1/12: appeared to be an ectopic atrial tachycardia. Required DCCV with hemodynamic instability  . GI bleed     12/11: Etiology not clearly defined. EGD: nonbleeding esophageal varices.  . S/P coronary artery stent placement 06/2010  . Esophageal varices 2011, 2013    no hx acute variceal bleed  . Hepatic encephalopathy 2011, 12/2013  . Myocardial infarction ? 2012  . Shortness of breath dyspnea   . Arthritis     Past Surgical History  Procedure Laterality Date  . Cardiac catheterization  07/01/2010    BMS to CFX.  Marland Kitchen Appendectomy    . Orif r leg    . Esophagogastroduodenoscopy  04/13/2012    Procedure: ESOPHAGOGASTRODUODENOSCOPY (EGD);  Surgeon: Inda Castle, MD;  Location: [facility];  Service: Endoscopy;  Laterality: N/A;  . Colonoscopy  04/13/2012    Procedure: COLONOSCOPY;  Surgeon: Inda Castle, MD;  Location: [facility] ENDOSCOPY;  Service: Endoscopy;  Laterality: N/A;  . Esophagogastroduodenoscopy N/A 01/01/2014    Procedure: ESOPHAGOGASTRODUODENOSCOPY (EGD);  Surgeon: Jerene Bears, MD;  Location: [facility] ENDOSCOPY;  Service: Endoscopy;  Laterality: N/A;  . Esophageal banding N/A 01/01/2014    Procedure: ESOPHAGEAL BANDING;  Surgeon: Jerene Bears, MD;  Location: [facility] ENDOSCOPY;  Service: Endoscopy;  Laterality: N/A;  . Coronary stent placement  06/30/2010    CFX   Distal           Allergies  No Active Allergies  HPI   This 59 year old gentleman with a known history of coronary artery disease was admitted yesterday evening for chest discomfort.  The patient has a history of diabetes mellitus type 2, hypertension, and cryptogenic nonalcoholic cirrhosis of liver.  In 2011 he had an inferior wall myocardial infarction treated emergently with a bare-metal stent to the mid circumflex coronary artery.  The patient was noted to have an 80% lesion in his right coronary artery which was not intervened upon.  He had a subsequent Myoview stress test on 01/13/11 which did not show any significant reversible ischemia and demonstrated his old stable inferolateral scar. For the past several months the patient has noted decreased exercise tolerance associated with very minimal left-sided chest pressure, relieved by rest.  He has not had to take any sublingual nitroglycerin for this.  Last evening he developed chest discomfort at rest which was a burning sensation across the lower portion of his chest bilaterally with radiation to his back.  His chest discomfort improved after sublingual nitroglycerin given by EMS. His EKG shows  a pattern of an old inferior wall myocardial infarction no acute changes.  Troponins are slightly elevated at 0.04 twice. Patient has a history of morbid obesity.  He is diabetic.  He has a history of nonalcoholic cirrhosis and a remote history of GI bleed in 2011 with endoscopy showing nonbleeding esophageal varices related to his cirrhosis.  Abdominal ultrasound this admission shows cholelithiasis without signs of definite cholecystitis and he also has evidence of portal  hypertension, splenic enlargement, and splenic vein varices.    Inpatient Medications  . aspirin EC  81 mg Oral Daily  . atenolol  50 mg Oral Daily  . dextrose      . enalapril  20 mg Oral Daily  . furosemide  40 mg Oral Daily  . insulin aspart  0-9 Units Subcutaneous TID WC  . insulin NPH Human  40 Units Subcutaneous BID AC & HS  . lactulose  20 g Oral BID  . multivitamin with minerals  1 tablet Oral Daily    Family History Family History  Problem Relation Age of Onset  . Heart attack Brother 47    MI  . Heart attack Brother 83    MI  . Heart attack Father   . Diabetes Father   . Diabetes Brother   . COPD Mother      Social History History   Social History  . Marital Status: Married    Spouse Name: N/A  . Number of Children: 3  . Years of Education: N/A   Occupational History  . Unemployed Other    Worked in maintenance prior   Social History Main Topics  . Smoking status: Former Smoker -- 1.00 packs/day for 36 years    Types: Cigarettes    Quit date: 06/03/2013  . Smokeless tobacco: Current User     Comment: uses vapor cigarettes (2016 )  . Alcohol Use: No  . Drug Use: No  . Sexual Activity: Not on file   Other Topics Concern  . Not on file   Social History Narrative   Married   Gets regular exercise: walking     Review of Systems  General:  No chills, fever, night sweats or weight changes.  Cardiovascular:  No chest pain, dyspnea on exertion, edema, orthopnea, palpitations, paroxysmal nocturnal dyspnea. Dermatological: No rash, lesions/masses Respiratory: No cough, dyspnea Urologic: No hematuria, dysuria Abdominal:   Denies any postprandial right upper quadrant discomfort to suggest cholecystitis. Neurologic:  No visual changes, wkns, changes in mental status. All other systems reviewed and are otherwise negative except as noted above.  Physical Exam  Blood pressure 122/68, pulse 76, temperature 97.8 F (36.6 C), temperature source Oral,  resp. rate 20, height 5' 7.5" (1.715 m), weight 283 lb 9.6 oz (128.64 kg), SpO2 97 %.  General: Pleasant, NAD Psych: Normal affect. Neuro: Alert and oriented X 3. Moves all extremities spontaneously. HEENT: Normal  Neck: Supple without bruits or JVD. Lungs:  Resp regular and unlabored, CTA. Heart: RRR no s3, s4, or murmurs. Abdomen: Soft, non-tender, non-distended, BS + x 4.  Extremities: No clubbing, cyanosis or edema. DP/PT/Radials 2+ and equal bilaterally.  Labs   Recent Labs  10/15/14 0630 10/15/14 0755  TROPONINI 0.04* 0.04*   Lab Results  Component Value Date   WBC 5.7 10/15/2014   HGB 13.3 10/15/2014   HCT 38.9* 10/15/2014   MCV 99.0 10/15/2014   PLT 55* 10/15/2014     Recent Labs Lab 10/15/14 0735  NA 137  K 3.3*  CL 106  CO2 23  BUN 8  CREATININE 0.78  CALCIUM 8.6  PROT 6.3  BILITOT 1.7*  ALKPHOS 104  ALT 52  AST 69*  GLUCOSE 78   Lab Results  Component Value Date   CHOL 132 03/15/2011   HDL 54 03/15/2011   LDLCALC 70 03/15/2011   TRIG 41 03/15/2011   No results found for: DDIMER  Radiology/Studies  Dg Chest 2 View  10/14/2014   CLINICAL DATA:  Pain across the chest and shortness of breath tonight.  EXAM: CHEST  2 VIEW  COMPARISON:  01/12/2014  FINDINGS: The heart size and mediastinal contours are within normal limits. Both lungs are clear. The visualized skeletal structures are unremarkable.  IMPRESSION: No active cardiopulmonary disease.   Electronically Signed   By: Lucienne Capers M.D.   On: 10/14/2014 22:47   US Abdomen Complete  10/15/2014   CLINICAL DATA:  Chest pain. Diffuse abdominal pain in the right upper quadrant and left upper quadrant and midline. History of nonalcoholic cirrhosis. Diabetes and CHF.  EXAM: ULTRASOUND ABDOMEN COMPLETE  COMPARISON:  CT abdomen and pelvis 12/29/2013  FINDINGS: Gallbladder: Gallbladder is mildly distended with mild diffuse wall thickening. Small stones layering in the gallbladder neck. Murphy's sign is  negative.  Common bile duct: Diameter: 5.9 mm, normal  Liver: Heterogeneous parenchymal echotexture pattern with nodular contour consistent with history of cirrhosis. Limited color flow Doppler imaging of the main portal vein shows flow in the appropriate direction. Recanalized periumbilical vein is noted.  IVC: Not visualized.  Pancreas: Not visualized.  Spleen: Diffusely enlarged spleen. Volume calculates to 828 mL. Prominent vascular structures in the splenic hilum likely representing varices.  Right Kidney: Length: 11.9 cm. Echogenicity within normal limits. No mass or hydronephrosis visualized.  Left Kidney: Length: 12.5 cm. Limited visualization. Echogenicity within normal limits. No mass or hydronephrosis visualized.  Abdominal aorta: No aneurysm visualized.  Other findings: None.  IMPRESSION: Cholelithiasis with mild gallbladder distention and wall thickening. Murphy's sign is negative. This is nonspecific for acute cholecystitis. Changes of hepatic cirrhosis with portal venous hypertension including splenic enlargement, splenic vein varices, and recanalized periumbilical vein.   Electronically Signed   By: Lucienne Capers M.D.   On: 10/15/2014 00:47    ECG  14-Oct-2014 21:42:00 [facility]/ED ROUTINE RECORD Sinus rhythm Probable left atrial enlargement Inferior infarct, old Confirmed by [facility] MD, CHRISTOPHER [phone]) on 10/14/2014 11:04:24 PM Personally reviewed. ASSESSMENT AND PLAN  1.  Ischemic heart disease status post inferior wall myocardial infarction secondary to occluded mid circumflex coronary artery in December 2011 treated with bare-metal stent.  Known 80% stenosis of right coronary artery noted at that time, not intervened upon.  Subsequent Myoview July 2012 showed no reversible ischemia.  Patient presents now with history of increasing exertional dyspnea and questionable left-sided mild chest tightness for the past several months, relieved by rest.  Since last night  with more severe chest discomfort and slight elevation of troponins. 2.  Cholelithiasis 3.  Nonalcoholic cirrhosis with portal venous hypertension, splenic enlargement, and splenic vein varices.  Remote history of GI bleed 2011.  Esophageal banding by Dr. Hilarie Fredrickson in 2015. 4.  Diabetes mellitus 5.  Hyperlipidemia  Recommendation: We will proceed with left heart catheterization today.  In view of his GI issues, consider bare metal stent if he requires stenting. Signed, Darlin Coco, MD  10/15/2014, 10:04 AM

## 2014-10-15 NOTE — Consult Note (Signed)
CARDIOLOGY CONSULT NOTE   Patient ID: Adam Butler MRN: [id number], DOB/AGE: [birth year]   Admit date: 10/14/2014 Date of Consult: 10/15/2014   Primary Physician: Antonietta Jewel, MD Primary Cardiologist: Dr. Aundra Dubin  Pt. Profile  59 year old gentleman admitted with chest pain.  History of prior inferior wall myocardial infarction in 2011.  Problem List  Past Medical History  Diagnosis Date  . Esophageal varices 04/2012  . Cirrhosis 2011    Cryptogenic, Likely NASH. Family/pt deny EtOH. HCV, HBV, HAV negative. ANA negative. AMA positive. Ascites 12/11  . Hyperlipemia   . Coronary artery disease     Inferior MI 12/11; LHC with occluded mid CFX and 80% proximal RCA. EF 55%. He had 3.0 x 28 vision BMS to CFX  . Diastolic CHF, acute     Echo 12/11 with ef 50-55% and mild LVH. EF 55% by LV0gram in 12/11  . Hypertension   . Type II diabetes mellitus 2011  . SVT (supraventricular tachycardia)     1/12: appeared to be an ectopic atrial tachycardia. Required DCCV with hemodynamic instability  . GI bleed     12/11: Etiology not clearly defined. EGD: nonbleeding esophageal varices.  . S/P coronary artery stent placement 06/2010  . Esophageal varices 2011, 2013    no hx acute variceal bleed  . Hepatic encephalopathy 2011, 12/2013  . Myocardial infarction ? 2012  . Shortness of breath dyspnea   . Arthritis     Past Surgical History  Procedure Laterality Date  . Cardiac catheterization  07/01/2010    BMS to CFX.  Marland Kitchen Appendectomy    . Orif r leg    . Esophagogastroduodenoscopy  04/13/2012    Procedure: ESOPHAGOGASTRODUODENOSCOPY (EGD);  Surgeon: Inda Castle, MD;  Location: [facility];  Service: Endoscopy;  Laterality: N/A;  . Colonoscopy  04/13/2012    Procedure: COLONOSCOPY;  Surgeon: Inda Castle, MD;  Location: [facility] ENDOSCOPY;  Service: Endoscopy;  Laterality: N/A;  . Esophagogastroduodenoscopy N/A 01/01/2014    Procedure: ESOPHAGOGASTRODUODENOSCOPY (EGD);  Surgeon: Jerene Bears, MD;  Location: [facility] ENDOSCOPY;  Service: Endoscopy;  Laterality: N/A;  . Esophageal banding N/A 01/01/2014    Procedure: ESOPHAGEAL BANDING;  Surgeon: Jerene Bears, MD;  Location: [facility] ENDOSCOPY;  Service: Endoscopy;  Laterality: N/A;  . Coronary stent placement  06/30/2010    CFX   Distal           Allergies  No Active Allergies  HPI   This 59 year old gentleman with a known history of coronary artery disease was admitted yesterday evening for chest discomfort.  The patient has a history of diabetes mellitus type 2, hypertension, and cryptogenic nonalcoholic cirrhosis of liver.  In 2011 he had an inferior wall myocardial infarction treated emergently with a bare-metal stent to the mid circumflex coronary artery.  The patient was noted to have an 80% lesion in his right coronary artery which was not intervened upon.  He had a subsequent Myoview stress test on 01/13/11 which did not show any significant reversible ischemia and demonstrated his old stable inferolateral scar. For the past several months the patient has noted decreased exercise tolerance associated with very minimal left-sided chest pressure, relieved by rest.  He has not had to take any sublingual nitroglycerin for this.  Last evening he developed chest discomfort at rest which was a burning sensation across the lower portion of his chest bilaterally with radiation to his back.  His chest discomfort improved after sublingual nitroglycerin given by EMS. His EKG shows  a pattern of an old inferior wall myocardial infarction no acute changes.  Troponins are slightly elevated at 0.04 twice. Patient has a history of morbid obesity.  He is diabetic.  He has a history of nonalcoholic cirrhosis and a remote history of GI bleed in 2011 with endoscopy showing nonbleeding esophageal varices related to his cirrhosis.  Abdominal ultrasound this admission shows cholelithiasis without signs of definite cholecystitis and he also has evidence of portal  hypertension, splenic enlargement, and splenic vein varices.    Inpatient Medications  . aspirin EC  81 mg Oral Daily  . atenolol  50 mg Oral Daily  . dextrose      . enalapril  20 mg Oral Daily  . furosemide  40 mg Oral Daily  . insulin aspart  0-9 Units Subcutaneous TID WC  . insulin NPH Human  40 Units Subcutaneous BID AC & HS  . lactulose  20 g Oral BID  . multivitamin with minerals  1 tablet Oral Daily    Family History Family History  Problem Relation Age of Onset  . Heart attack Brother 21    MI  . Heart attack Brother 44    MI  . Heart attack Father   . Diabetes Father   . Diabetes Brother   . COPD Mother      Social History History   Social History  . Marital Status: Married    Spouse Name: N/A  . Number of Children: 3  . Years of Education: N/A   Occupational History  . Unemployed Other    Worked in maintenance prior   Social History Main Topics  . Smoking status: Former Smoker -- 1.00 packs/day for 36 years    Types: Cigarettes    Quit date: 06/03/2013  . Smokeless tobacco: Current User     Comment: uses vapor cigarettes (2016 )  . Alcohol Use: No  . Drug Use: No  . Sexual Activity: Not on file   Other Topics Concern  . Not on file   Social History Narrative   Married   Gets regular exercise: walking     Review of Systems  General:  No chills, fever, night sweats or weight changes.  Cardiovascular:  No chest pain, dyspnea on exertion, edema, orthopnea, palpitations, paroxysmal nocturnal dyspnea. Dermatological: No rash, lesions/masses Respiratory: No cough, dyspnea Urologic: No hematuria, dysuria Abdominal:   Denies any postprandial right upper quadrant discomfort to suggest cholecystitis. Neurologic:  No visual changes, wkns, changes in mental status. All other systems reviewed and are otherwise negative except as noted above.  Physical Exam  Blood pressure 122/68, pulse 76, temperature 97.8 F (36.6 C), temperature source Oral,  resp. rate 20, height 5' 7.5" (1.715 m), weight 283 lb 9.6 oz (128.64 kg), SpO2 97 %.  General: Pleasant, NAD Psych: Normal affect. Neuro: Alert and oriented X 3. Moves all extremities spontaneously. HEENT: Normal  Neck: Supple without bruits or JVD. Lungs:  Resp regular and unlabored, CTA. Heart: RRR no s3, s4, or murmurs. Abdomen: Soft, non-tender, non-distended, BS + x 4.  Extremities: No clubbing, cyanosis or edema. DP/PT/Radials 2+ and equal bilaterally.  Labs   Recent Labs  10/15/14 0630 10/15/14 0755  TROPONINI 0.04* 0.04*   Lab Results  Component Value Date   WBC 5.7 10/15/2014   HGB 13.3 10/15/2014   HCT 38.9* 10/15/2014   MCV 99.0 10/15/2014   PLT 55* 10/15/2014     Recent Labs Lab 10/15/14 0735  NA 137  K 3.3*  CL 106  CO2 23  BUN 8  CREATININE 0.78  CALCIUM 8.6  PROT 6.3  BILITOT 1.7*  ALKPHOS 104  ALT 52  AST 69*  GLUCOSE 78   Lab Results  Component Value Date   CHOL 132 03/15/2011   HDL 54 03/15/2011   LDLCALC 70 03/15/2011   TRIG 41 03/15/2011   No results found for: DDIMER  Radiology/Studies  Dg Chest 2 View  10/14/2014   CLINICAL DATA:  Pain across the chest and shortness of breath tonight.  EXAM: CHEST  2 VIEW  COMPARISON:  01/12/2014  FINDINGS: The heart size and mediastinal contours are within normal limits. Both lungs are clear. The visualized skeletal structures are unremarkable.  IMPRESSION: No active cardiopulmonary disease.   Electronically Signed   By: Lucienne Capers M.D.   On: 10/14/2014 22:47   US Abdomen Complete  10/15/2014   CLINICAL DATA:  Chest pain. Diffuse abdominal pain in the right upper quadrant and left upper quadrant and midline. History of nonalcoholic cirrhosis. Diabetes and CHF.  EXAM: ULTRASOUND ABDOMEN COMPLETE  COMPARISON:  CT abdomen and pelvis 12/29/2013  FINDINGS: Gallbladder: Gallbladder is mildly distended with mild diffuse wall thickening. Small stones layering in the gallbladder neck. Murphy's sign is  negative.  Common bile duct: Diameter: 5.9 mm, normal  Liver: Heterogeneous parenchymal echotexture pattern with nodular contour consistent with history of cirrhosis. Limited color flow Doppler imaging of the main portal vein shows flow in the appropriate direction. Recanalized periumbilical vein is noted.  IVC: Not visualized.  Pancreas: Not visualized.  Spleen: Diffusely enlarged spleen. Volume calculates to 828 mL. Prominent vascular structures in the splenic hilum likely representing varices.  Right Kidney: Length: 11.9 cm. Echogenicity within normal limits. No mass or hydronephrosis visualized.  Left Kidney: Length: 12.5 cm. Limited visualization. Echogenicity within normal limits. No mass or hydronephrosis visualized.  Abdominal aorta: No aneurysm visualized.  Other findings: None.  IMPRESSION: Cholelithiasis with mild gallbladder distention and wall thickening. Murphy's sign is negative. This is nonspecific for acute cholecystitis. Changes of hepatic cirrhosis with portal venous hypertension including splenic enlargement, splenic vein varices, and recanalized periumbilical vein.   Electronically Signed   By: Lucienne Capers M.D.   On: 10/15/2014 00:47    ECG  14-Oct-2014 21:42:00 [facility]/ED ROUTINE RECORD Sinus rhythm Probable left atrial enlargement Inferior infarct, old Confirmed by [facility] MD, CHRISTOPHER [phone]) on 10/14/2014 11:04:24 PM Personally reviewed. ASSESSMENT AND PLAN  1.  Ischemic heart disease status post inferior wall myocardial infarction secondary to occluded mid circumflex coronary artery in December 2011 treated with bare-metal stent.  Known 80% stenosis of right coronary artery noted at that time, not intervened upon.  Subsequent Myoview July 2012 showed no reversible ischemia.  Patient presents now with history of increasing exertional dyspnea and questionable left-sided mild chest tightness for the past several months, relieved by rest.  Since last night  with more severe chest discomfort and slight elevation of troponins. 2.  Cholelithiasis 3.  Nonalcoholic cirrhosis with portal venous hypertension, splenic enlargement, and splenic vein varices.  Remote history of GI bleed 2011.  Esophageal banding by Dr. Hilarie Fredrickson in 2015. 4.  Diabetes mellitus 5.  Hyperlipidemia  Recommendation: We will proceed with left heart catheterization today.  In view of his GI issues, consider bare metal stent if he requires stenting. Signed, Darlin Coco, MD  10/15/2014, 10:04 AM

## 2014-10-15 NOTE — H&P (Signed)
[facility] History and Physical  Adam Butler YBO:[id number] DOB: [birth year] DOA: 10/14/2014  Referring physician: ER physician. PCP: Antonietta Jewel, MD   Chief Complaint: Chest pain.  HPI: Adam Butler is a 59 y.o. male with known history of CAD status post stenting, cryptogenic cirrhosis of liver, diabetes mellitus type 2, tobacco abuse, hypertension presents to the ER because of chest pain. Patient states he was having mild chest pain fairly poor of the evening and later on 7:30 PM it became more intense. Pain was mostly in the lower part of his chest burning across his both upper quadrants of his abdomen and radiating to the back. Had mild nausea denies any vomiting. Denies any abdominal pain. Denies any productive cough fever or chills. Has shortness of breath which is been ongoing for some as few weeks. In the ER cardiac markers were negative. Sonogram of abdomen shows cholelithiasis with no definite signs of cholecystitis. Patient's chest pain improved after patient got sublingual nitroglycerin by the EMS and taking aspirin. [name] chest pain-free. Patient has been admitted for further management.   Review of Systems: As presented in the history of presenting illness, rest negative.  Past Medical History  Diagnosis Date  . Esophageal varices 04/2012  . Cirrhosis 2011    Cryptogenic, Likely NASH. Family/pt deny EtOH. HCV, HBV, HAV negative. ANA negative. AMA positive. Ascites 12/11  . Hyperlipemia   . Coronary artery disease     Inferior MI 12/11; LHC with occluded mid CFX and 80% proximal RCA. EF 55%. He had 3.0 x 28 vision BMS to CFX  . Diastolic CHF, acute     Echo 12/11 with ef 50-55% and mild LVH. EF 55% by LV0gram in 12/11  . Hypertension   . Type II diabetes mellitus 2011  . SVT (supraventricular tachycardia)     1/12: appeared to be an ectopic atrial tachycardia. Required DCCV with hemodynamic instability  . GI bleed     12/11: Etiology not clearly defined.  EGD: nonbleeding esophageal varices.  . S/P coronary artery stent placement 06/2010  . Esophageal varices 2011, 2013    no hx acute variceal bleed  . Hepatic encephalopathy 2011, 12/2013   Past Surgical History  Procedure Laterality Date  . Cardiac catheterization  07/01/2010    BMS to CFX.  Marland Kitchen Appendectomy    . Orif r leg    . Esophagogastroduodenoscopy  04/13/2012    Procedure: ESOPHAGOGASTRODUODENOSCOPY (EGD);  Surgeon: Inda Castle, MD;  Location: [facility];  Service: Endoscopy;  Laterality: N/A;  . Colonoscopy  04/13/2012    Procedure: COLONOSCOPY;  Surgeon: Inda Castle, MD;  Location: [facility] ENDOSCOPY;  Service: Endoscopy;  Laterality: N/A;  . Esophagogastroduodenoscopy N/A 01/01/2014    Procedure: ESOPHAGOGASTRODUODENOSCOPY (EGD);  Surgeon: Jerene Bears, MD;  Location: [facility] ENDOSCOPY;  Service: Endoscopy;  Laterality: N/A;  . Esophageal banding N/A 01/01/2014    Procedure: ESOPHAGEAL BANDING;  Surgeon: Jerene Bears, MD;  Location: [facility] ENDOSCOPY;  Service: Endoscopy;  Laterality: N/A;   Social History:  reports that he quit smoking about 16 months ago. His smoking use included Cigarettes. He has a 36 pack-year smoking history. He has quit using smokeless tobacco. He reports that he does not drink alcohol or use illicit drugs. Where does patient live at home. Can patient participate in ADLs? Yes.  No Active Allergies  Family History:  Family History  Problem Relation Age of Onset  . Heart attack Brother 65    MI  . Heart attack Brother  36    MI  . Heart attack Father   . Diabetes Father   . Diabetes Brother   . COPD Mother       Prior to Admission medications   Medication Sig Start Date End Date Taking? Authorizing Provider  aspirin 81 MG tablet Take 81 mg by mouth daily.    Yes Historical Provider, MD  atenolol (TENORMIN) 50 MG tablet Take 50 mg by mouth daily.   Yes Historical Provider, MD  enalapril (VASOTEC) 20 MG tablet Take 20 mg by mouth daily.   Yes Historical  Provider, MD  furosemide (LASIX) 40 MG tablet Take 40 mg by mouth daily.  01/09/12  Yes Larey Dresser, MD  insulin NPH Human (HUMULIN N,NOVOLIN N) 100 UNIT/ML injection Inject 50-80 Units into the skin See admin instructions. Take 80 units in the morning and 50 units in the evening.   Yes Historical Provider, MD  lactulose (CHRONULAC) 10 GM/15ML solution Take 30 mLs (20 g total) by mouth 3 (three) times daily. Patient taking differently: Take 20 g by mouth 2 (two) times daily.  01/13/14  Yes Annita Brod, MD  metFORMIN (GLUCOPHAGE) 500 MG tablet Take 1,000 mg by mouth 2 (two) times daily.    Yes Historical Provider, MD  Multiple Vitamin (MULTIVITAMIN) capsule Take 1 capsule by mouth daily.     Yes Historical Provider, MD  nitroGLYCERIN (NITROSTAT) 0.4 MG SL tablet Place 1 tablet (0.4 mg total) under the tongue every 5 (five) minutes as needed. 01/09/12  Yes Larey Dresser, MD  sulfamethoxazole-trimethoprim (BACTRIM DS,SEPTRA DS) 800-160 MG per tablet Take 1 tablet by mouth daily.   Yes Historical Provider, MD  nadolol (CORGARD) 20 MG tablet Take 1 tablet (20 mg total) by mouth daily. 01/01/14   Sheila Oats, MD  omeprazole (PRILOSEC) 40 MG capsule Take 1 capsule (40 mg total) by mouth daily. 01/01/14   Sheila Oats, MD  rifaximin (XIFAXAN) 550 MG TABS tablet Take 1 tablet (550 mg total) by mouth 2 (two) times daily. 01/01/14   Sheila Oats, MD    Physical Exam: Filed Vitals:   10/14/14 2145 10/14/14 2153 10/14/14 2200 10/14/14 2230  BP: 119/63 119/63 127/60 140/52  Pulse: 78 75 73 73  Temp:  97.8 F (36.6 C)    TempSrc:  Oral    Resp: 16 24 17 17   Height:  5' 7"  (1.702 m)    Weight:  131.09 kg (289 lb)    SpO2: 99% 99% 99% 100%     General:  Obese not in distress.  Eyes: Anicteric no pallor.  ENT: No discharge from the ears eyes nose and mouth.  Neck: No mass felt.  Cardiovascular: S1 and S2 heard.  Respiratory: No rhonchi or crepitations.  Abdomen: Soft nontender  bowel sounds present.  Skin: No rash.  Musculoskeletal: No edema.  Psychiatric: Appears normal.  Neurologic: Alert awake oriented to time place and person. Moves all extremities.  Labs on Admission:  Basic Metabolic Panel:  Recent Labs Lab 10/14/14 2248  NA 137  K 3.9  CL 106  CO2 21  GLUCOSE 224*  BUN 8  CREATININE 0.98  CALCIUM 8.6   Liver Function Tests:  Recent Labs Lab 10/14/14 2248  AST 77*  ALT 55*  ALKPHOS 133*  BILITOT 1.9*  PROT 6.8  ALBUMIN 3.0*    Recent Labs Lab 10/14/14 2248  LIPASE 49   No results for input(s): AMMONIA in the last 168 hours. CBC:  Recent  Labs Lab 10/14/14 2248  WBC 6.4  NEUTROABS 4.7  HGB 13.7  HCT 40.6  MCV 100.2*  PLT 54*   Cardiac Enzymes: No results for input(s): CKTOTAL, CKMB, CKMBINDEX, TROPONINI in the last 168 hours.  BNP (last 3 results)  Recent Labs  10/14/14 2248  BNP 56.1    ProBNP (last 3 results) No results for input(s): PROBNP in the last 8760 hours.  CBG: No results for input(s): GLUCAP in the last 168 hours.  Radiological Exams on Admission: Dg Chest 2 View  10/14/2014   CLINICAL DATA:  Pain across the chest and shortness of breath tonight.  EXAM: CHEST  2 VIEW  COMPARISON:  01/12/2014  FINDINGS: The heart size and mediastinal contours are within normal limits. Both lungs are clear. The visualized skeletal structures are unremarkable.  IMPRESSION: No active cardiopulmonary disease.   Electronically Signed   By: Lucienne Capers M.D.   On: 10/14/2014 22:47   US Abdomen Complete  10/15/2014   CLINICAL DATA:  Chest pain. Diffuse abdominal pain in the right upper quadrant and left upper quadrant and midline. History of nonalcoholic cirrhosis. Diabetes and CHF.  EXAM: ULTRASOUND ABDOMEN COMPLETE  COMPARISON:  CT abdomen and pelvis 12/29/2013  FINDINGS: Gallbladder: Gallbladder is mildly distended with mild diffuse wall thickening. Small stones layering in the gallbladder neck. Murphy's sign is  negative.  Common bile duct: Diameter: 5.9 mm, normal  Liver: Heterogeneous parenchymal echotexture pattern with nodular contour consistent with history of cirrhosis. Limited color flow Doppler imaging of the main portal vein shows flow in the appropriate direction. Recanalized periumbilical vein is noted.  IVC: Not visualized.  Pancreas: Not visualized.  Spleen: Diffusely enlarged spleen. Volume calculates to 828 mL. Prominent vascular structures in the splenic hilum likely representing varices.  Right Kidney: Length: 11.9 cm. Echogenicity within normal limits. No mass or hydronephrosis visualized.  Left Kidney: Length: 12.5 cm. Limited visualization. Echogenicity within normal limits. No mass or hydronephrosis visualized.  Abdominal aorta: No aneurysm visualized.  Other findings: None.  IMPRESSION: Cholelithiasis with mild gallbladder distention and wall thickening. Murphy's sign is negative. This is nonspecific for acute cholecystitis. Changes of hepatic cirrhosis with portal venous hypertension including splenic enlargement, splenic vein varices, and recanalized periumbilical vein.   Electronically Signed   By: Lucienne Capers M.D.   On: 10/15/2014 00:47    EKG: Independently reviewed. Normal sinus rhythm with old inferior wall infarct.  Assessment/Plan Principal Problem:   Chest pain Active Problems:   Hepatic cirrhosis   Thrombocytopenia   Diabetes mellitus type 2, controlled   Cholelithiasis   1. Chest pain - given the history of CAD status post a day at this time we will cycle cardiac markers. Check 2-D echo and patient is on aspirin. When necessary nitroglycerin. Patient will be kept nothing by mouth past 4 AM in anticipation of possible procedures. 2. Gallstones - check HIDA scan to rule out cholecystitis. 3. Cirrhosis of the liver - continue Lasix and lactulose. 4. Chronic thrombocytopenia - probably from cirrhosis follow CBC. 5. Diabetes mellitus type 2 - since patient will be nothing  by mouth past 4 AM I have decrease patient's NPH dose by half in the morning. Closely follow CBGs. 6. Hypertension - continue home medications.  DVT PROHYLAXIS - SCDs as patient has severe thrombocytopenia.  Code Status:  Full code.  Family Communication:  Family at the bedside.  Disposition Plan:  Admit for observation.    Adeoluwa Silvers N. [facility] Pager [phone].  If 7PM-7AM, please  contact night-coverage [email or web address] Password [email or web address] 10/15/2014, 3:03 AM

## 2014-10-15 NOTE — ED Notes (Signed)
Dr. Hal Hope advised patient can have clear liquids, needs to be NPO after 0400

## 2014-10-15 NOTE — ED Notes (Signed)
Patient's spouse wanted to leave her cell number in case she was needed, cell number is [phone]

## 2014-10-15 NOTE — CV Procedure (Signed)
    Cardiac Catheterization Procedure Note  Name: Adam Butler MRN: [id number] DOB: 05/05/1956  Procedure: Left Heart Cath, Selective Coronary Angiography, LV angiography  Indication: 59 yo WM with history of inferolateral MI in 2011 s/p stenting of the LCx presents with chest pain.    Procedural Details: The right wrist was prepped, draped, and anesthetized with 1% lidocaine. Using the modified Seldinger technique, a 6 French slender sheath was introduced into the right radial artery. 3 mg of verapamil was administered through the sheath, weight-based unfractionated heparin was administered intravenously. Standard Judkins catheters were used for selective coronary angiography and left ventriculography. The coronaries were difficult to engage from the radial approach due to his morbid obesity and leftward takeoff of the innominate artery. The left coronary was viewed with a left Judkins 3.0. Although images were not ideal they were adequate.  Catheter exchanges were performed over an exchange length guidewire. There were no immediate procedural complications. A TR band was used for radial hemostasis at the completion of the procedure.  The patient was transferred to the post catheterization recovery area for further monitoring.  Procedural Findings: Hemodynamics: AO 88/53 mean 70 mm Hg LV 96/16 mm Hg  Coronary angiography: Coronary dominance: right  Left mainstem: Normal.   Left anterior descending (LAD): Normal  Left circumflex (LCx): The stent in the mid LCx is widely patent. There is no significant disease in this vessel.   Right coronary artery (RCA): The RCA is occluded 100% proximally. There are good left to right collaterals to the entire vessel.   Left ventriculography: Left ventricular systolic function is abnormal. There is inferior hypokinesis.  LVEF is estimated at 45-50%, there is no significant mitral regurgitation   Final Conclusions:   1. Single vessel occlusive  CAD with chronic total occlusion of the RCA. Good left to right collaterals. Patent stent in the mid LCx. 2. Mild LV dysfunction with inferior wall motion abnormality.  Recommendations: Medical management. He would be a poor candidate for CTO PCI due to his morbid obesity and bleeding risk.   Kennedy Brines [place], [place]  10/15/2014, 3:44 PM

## 2014-10-15 NOTE — Progress Notes (Signed)
UR completed 

## 2014-10-16 ENCOUNTER — Observation Stay (HOSPITAL_COMMUNITY): Payer: PPO

## 2014-10-16 ENCOUNTER — Encounter (HOSPITAL_COMMUNITY): Payer: Self-pay | Admitting: Cardiology

## 2014-10-16 DIAGNOSIS — K8020 Calculus of gallbladder without cholecystitis without obstruction: Secondary | ICD-10-CM | POA: Diagnosis not present

## 2014-10-16 DIAGNOSIS — R079 Chest pain, unspecified: Secondary | ICD-10-CM | POA: Diagnosis not present

## 2014-10-16 LAB — COMPREHENSIVE METABOLIC PANEL
ALBUMIN: 2.7 g/dL — AB (ref 3.5–5.2)
ALK PHOS: 86 U/L (ref 39–117)
ALT: 47 U/L (ref 0–53)
ANION GAP: 7 (ref 5–15)
AST: 66 U/L — ABNORMAL HIGH (ref 0–37)
BUN: 8 mg/dL (ref 6–23)
CHLORIDE: 107 mmol/L (ref 96–112)
CO2: 23 mmol/L (ref 19–32)
Calcium: 8.2 mg/dL — ABNORMAL LOW (ref 8.4–10.5)
Creatinine, Ser: 0.8 mg/dL (ref 0.50–1.35)
GFR calc non Af Amer: >90 mL/min (ref >90)
Glucose, Bld: 160 mg/dL — ABNORMAL HIGH (ref 70–99)
POTASSIUM: 3.8 mmol/L (ref 3.5–5.1)
SODIUM: 137 mmol/L (ref 135–145)
TOTAL PROTEIN: 6.1 g/dL (ref 6.0–8.3)
Total Bilirubin: 1.8 mg/dL — ABNORMAL HIGH (ref 0.3–1.2)

## 2014-10-16 LAB — CBC
HEMATOCRIT: 38.4 % — AB (ref 39.0–52.0)
HEMOGLOBIN: 12.9 g/dL — AB (ref 13.0–17.0)
MCH: 33.7 pg (ref 26.0–34.0)
MCHC: 33.6 g/dL (ref 30.0–36.0)
MCV: 100.3 fL — ABNORMAL HIGH (ref 78.0–100.0)
Platelets: 49 10*3/uL — ABNORMAL LOW (ref 150–400)
RBC: 3.83 MIL/uL — ABNORMAL LOW (ref 4.22–5.81)
RDW: 14.1 % (ref 11.5–15.5)
WBC: 3.9 10*3/uL — ABNORMAL LOW (ref 4.0–10.5)

## 2014-10-16 LAB — GLUCOSE, CAPILLARY
GLUCOSE-CAPILLARY: 128 mg/dL — AB (ref 70–99)
Glucose-Capillary: 155 mg/dL — ABNORMAL HIGH (ref 70–99)

## 2014-10-16 LAB — LIPASE, BLOOD: LIPASE: 51 U/L (ref 11–59)

## 2014-10-16 MED ORDER — SINCALIDE 5 MCG IJ SOLR
INTRAMUSCULAR | Status: AC
  Administered 2014-10-16: 2.6 ug
  Filled 2014-10-16: qty 5

## 2014-10-16 MED ORDER — TECHNETIUM TC 99M MEBROFENIN IV KIT
5.0000 | PACK | Freq: Once | INTRAVENOUS | Status: AC | PRN
  Administered 2014-10-16: 5 via INTRAVENOUS

## 2014-10-16 MED ORDER — SINCALIDE 5 MCG IJ SOLR: 0.0200 ug/kg | Freq: Once | INTRAMUSCULAR | Status: DC

## 2014-10-16 MED ORDER — STERILE WATER FOR INJECTION IJ SOLN
INTRAMUSCULAR | Status: AC
  Administered 2014-10-16: 10 mL
  Filled 2014-10-16: qty 10

## 2014-10-16 MED ORDER — SINCALIDE 5 MCG IJ SOLR
INTRAMUSCULAR | Status: AC
  Administered 2014-10-16: 2.6 ug via INTRAVENOUS
  Filled 2014-10-16: qty 5

## 2014-10-16 MED ORDER — SINCALIDE 5 MCG IJ SOLR
0.0200 ug/kg | Freq: Once | INTRAMUSCULAR | Status: AC
  Administered 2014-10-16: 2.6 ug via INTRAVENOUS

## 2014-10-16 NOTE — Progress Notes (Signed)
Patient Name: Adam Butler Date of Encounter: 10/16/2014  Principal Problem:   Chest pain Active Problems:   Hepatic cirrhosis   Thrombocytopenia   Diabetes mellitus type 2, controlled   Cholelithiasis   Pain in the chest   Length of Stay: 1  SUBJECTIVE  No targets for revascularization or culprits for chest pain identified at cardiac cath. 100% chronic RCA occlusion with downstream hypokinesis and mild LV dysfunction. LVEDP was not significantly elevated (doubt his dyspnea is cardiac related, more likely COPD and obesity).  CURRENT MEDS . aspirin EC  81 mg Oral Daily  . atenolol  50 mg Oral Daily  . enalapril  20 mg Oral Daily  . furosemide  40 mg Oral Daily  . heparin  5,000 Units Subcutaneous 3 times per day  . insulin aspart  0-9 Units Subcutaneous TID WC  . insulin NPH Human  20 Units Subcutaneous BID AC & HS  . lactulose  20 g Oral BID  . multivitamin with minerals  1 tablet Oral Daily    OBJECTIVE   Intake/Output Summary (Last 24 hours) at 10/16/14 1218 Last data filed at 10/16/14 0160  Gross per 24 hour  Intake 1421.18 ml  Output    851 ml  Net 570.18 ml   Filed Weights   10/14/14 2153 10/15/14 0757 10/16/14 0530  Weight: 289 lb (131.09 kg) 283 lb 9.6 oz (128.64 kg) 284 lb 6.4 oz (129.003 kg)    PHYSICAL EXAM Filed Vitals:   10/15/14 2122 10/16/14 0158 10/16/14 0530 10/16/14 0900  BP: 106/58 104/48 112/51 112/54  Pulse: 67 67 68 64  Temp: 97.7 F (36.5 C) 98.3 F (36.8 C) 98 F (36.7 C) 97.6 F (36.4 C)  TempSrc: Oral Oral Oral Oral  Resp: 20 18 18 18   Height:      Weight:   284 lb 6.4 oz (129.003 kg)   SpO2: 97% 95% 95% 96%   General: Alert, oriented x3, no distress Head: no evidence of trauma, PERRL, EOMI, no exophtalmos or lid lag, no myxedema, no xanthelasma; normal ears, nose and oropharynx Neck: normal jugular venous pulsations and no hepatojugular reflux; brisk carotid pulses without delay and no carotid bruits Chest: clear to  auscultation, no signs of consolidation by percussion or palpation, normal fremitus, symmetrical and full respiratory excursions Cardiovascular: normal position and quality of the apical impulse, regular rhythm, normal first and second heart sounds, no rubs or gallops, no murmur Abdomen: no tenderness or distention, no masses by palpation, no abnormal pulsatility or arterial bruits, normal bowel sounds, no hepatosplenomegaly Extremities: healthy right radial access site; no clubbing, cyanosis or edema; 2+ radial, ulnar and brachial pulses bilaterally; 2+ right femoral, posterior tibial and dorsalis pedis pulses; 2+ left femoral, posterior tibial and dorsalis pedis pulses; no subclavian or femoral bruits Neurological: grossly nonfocal  LABS  CBC  Recent Labs  10/14/14 2248 10/15/14 0735 10/16/14 0523  WBC 6.4 5.7 3.9*  NEUTROABS 4.7 3.2  --   HGB 13.7 13.3 12.9*  HCT 40.6 38.9* 38.4*  MCV 100.2* 99.0 100.3*  PLT 54* 55* 49*   Basic Metabolic Panel  Recent Labs  10/15/14 0735 10/16/14 0523  NA 137 137  K 3.3* 3.8  CL 106 107  CO2 23 23  GLUCOSE 78 160*  BUN 8 8  CREATININE 0.78 0.80  CALCIUM 8.6 8.2*   Liver Function Tests  Recent Labs  10/15/14 0735 10/16/14 0523  AST 69* 66*  ALT 52 47  ALKPHOS 104 86  BILITOT 1.7* 1.8*  PROT 6.3 6.1  ALBUMIN 2.9* 2.7*    Recent Labs  10/15/14 0735 10/16/14 0523  LIPASE 254* 51   Cardiac Enzymes  Recent Labs  10/15/14 0630 10/15/14 0755  TROPONINI 0.04* 0.04*   Radiology Studies Imaging results have been reviewed and Dg Chest 2 View  10/14/2014   CLINICAL DATA:  Pain across the chest and shortness of breath tonight.  EXAM: CHEST  2 VIEW  COMPARISON:  01/12/2014  FINDINGS: The heart size and mediastinal contours are within normal limits. Both lungs are clear. The visualized skeletal structures are unremarkable.  IMPRESSION: No active cardiopulmonary disease.   Electronically Signed   By: Lucienne Capers M.D.   On:  10/14/2014 22:47   Nm Hepatobiliary Liver Func  10/16/2014   CLINICAL DATA:  Pain.  Cirrhosis.  EXAM: NUCLEAR MEDICINE HEPATOBILIARY IMAGING WITH GALLBLADDER EF  TECHNIQUE: Sequential images of the abdomen were obtained out to 60 minutes following intravenous administration of radiopharmaceutical. After slow intravenous infusion of 2.6 micrograms Cholecystokinin, gallbladder ejection fraction was determined.  RADIOPHARMACEUTICALS:  5.0 [employer] CE-83D Choletec  COMPARISON:  Ultrasound 10/15/2014.  FINDINGS: Liver, gallbladder, biliary system, bowel visualize normally. Gallbladder ejection fraction 45 minutes is 45.2%. This is normal. At 45 min, normal ejection fraction is greater than 40%. No reported symptoms following CCK administration  IMPRESSION: Normal exam.   Electronically Signed   By: Marcello Moores  Register   On: 10/16/2014 12:08   US Abdomen Complete  10/15/2014   CLINICAL DATA:  Chest pain. Diffuse abdominal pain in the right upper quadrant and left upper quadrant and midline. History of nonalcoholic cirrhosis. Diabetes and CHF.  EXAM: ULTRASOUND ABDOMEN COMPLETE  COMPARISON:  CT abdomen and pelvis 12/29/2013  FINDINGS: Gallbladder: Gallbladder is mildly distended with mild diffuse wall thickening. Small stones layering in the gallbladder neck. Murphy's sign is negative.  Common bile duct: Diameter: 5.9 mm, normal  Liver: Heterogeneous parenchymal echotexture pattern with nodular contour consistent with history of cirrhosis. Limited color flow Doppler imaging of the main portal vein shows flow in the appropriate direction. Recanalized periumbilical vein is noted.  IVC: Not visualized.  Pancreas: Not visualized.  Spleen: Diffusely enlarged spleen. Volume calculates to 828 mL. Prominent vascular structures in the splenic hilum likely representing varices.  Right Kidney: Length: 11.9 cm. Echogenicity within normal limits. No mass or hydronephrosis visualized.  Left Kidney: Length: 12.5 cm. Limited  visualization. Echogenicity within normal limits. No mass or hydronephrosis visualized.  Abdominal aorta: No aneurysm visualized.  Other findings: None.  IMPRESSION: Cholelithiasis with mild gallbladder distention and wall thickening. Murphy's sign is negative. This is nonspecific for acute cholecystitis. Changes of hepatic cirrhosis with portal venous hypertension including splenic enlargement, splenic vein varices, and recanalized periumbilical vein.   Electronically Signed   By: Lucienne Capers M.D.   On: 10/15/2014 00:47    TELE NSR   ASSESSMENT AND PLAN  OK for DC from Cardiology standpoint. Needs an updated lipid profile and f/u with Dr. Aundra Dubin. Chest pain may be gallstone related (happened after scrambled egg meal), but no sign of cholecystitis clinically or by nuclear scan.   Sanda Klein, MD, [facility] [phone] office [phone] pager 10/16/2014 12:18 PM

## 2014-10-16 NOTE — Progress Notes (Signed)
Nutrition Brief Note  RD consulted for assessment of nutrition status/requirements  Wt Readings from Last 15 Encounters:  10/16/14 284 lb 6.4 oz (129.003 kg)  01/13/14 280 lb 3.3 oz (127.1 kg)  12/29/13 292 lb 5.3 oz (132.6 kg)  04/10/12 272 lb 8 oz (123.605 kg)  01/09/12 275 lb (124.739 kg)  03/10/11 244 lb (110.678 kg)  01/13/11 235 lb (106.595 kg)  01/04/11 233 lb (105.688 kg)  09/03/10 213 lb (96.616 kg)  08/16/10 211 lb (95.709 kg)  08/02/10 211 lb (95.709 kg)    Body mass index is 43.86 kg/(m^2). Patient meets criteria for Morbid Obesity based on current BMI.   Current diet order is Heart healthy, patient is consuming approximately 75-100% of meals at this time. Labs and medications reviewed. Pt reports eating mostly toast with butter, eggs/bacon, sandwiches, crackers, potatoes, and meat PTA. RD emphasized the importance of nutrition. RD encouraged a general healthful diet with a restriction of fat and salt. Provided "Fat-Restricted Nutrition Therapy" and "5 Fat-Restricted sample menus" from the Academy of Nutrition and Dietetics. Discussed healthful snack ideas. Encouraged low fat dairy and leans meats; encouraged avoiding fried foods; encouraged intake of fruits and vegetables. Discouraged intake of salt and processed foods. Wife reports using Mrs. Dash at home to season food. RD contact information provided  No additional nutrition interventions warranted at this time. If nutrition issues arise, please consult RD.   Pryor Ochoa RD, LDN Inpatient Clinical Dietitian Pager: [phone] After Hours Pager: [phone]

## 2014-10-16 NOTE — Progress Notes (Signed)
PT Cancellation Note  Patient Details Name: MONTAVIOUS WIERZBA MRN: [id number] DOB: 04-Aug-1955   Cancelled Treatment:    Reason Eval/Treat Not Completed: Patient at procedure or test/unavailable Patient off unit at this time. Will follow up for PT evaluation as time/schedule allows.  Ellouise Newer 10/16/2014, 9:35 AM Elayne Snare, [place]

## 2014-10-16 NOTE — Progress Notes (Signed)
D/C'd IV; D/c'd telemetry; D/C'd pt.  Explained discharge instructions to patient and family, and they had no further questions at this time.  Pt in no acute distress.

## 2014-10-17 LAB — HEMOGLOBIN A1C
Hgb A1c MFr Bld: 8.3 % — ABNORMAL HIGH (ref 4.8–5.6)
Mean Plasma Glucose: 192 mg/dL

## 2014-10-20 NOTE — Discharge Summary (Signed)
Physician Discharge Summary  JANE BROUGHTON BMW:[id number] DOB: Feb 10, 1956 DOA: 10/14/2014  PCP: Antonietta Jewel, MD  Admit date: 10/14/2014 Discharge date: 10/16/2014  Time spent: 30 minutes  Recommendations for Outpatient Follow-up:  1. Follow up with GI, and surgery as needed 2. Follow up with cardiology as recommended. )  Discharge Diagnoses:  Principal Problem:   Chest pain Active Problems:   Hepatic cirrhosis   Thrombocytopenia   Diabetes mellitus type 2, controlled   Cholelithiasis   Pain in the chest   Discharge Condition: improved  Diet recommendation: low sodium carb modified diet  Filed Weights   10/14/14 2153 10/15/14 0757 10/16/14 0530  Weight: 131.09 kg (289 lb) 128.64 kg (283 lb 9.6 oz) 129.003 kg (284 lb 6.4 oz)    History of present illness:  Pt is 59 yo male with known history of CAD status post stenting, cryptogenic cirrhosis of liver, diabetes mellitus type 2, tobacco abuse, HTN, who presented to [facility] ED with chest pain in the mid sternal area . He was evaluate for ACS and cardiology consulted.   Hospital Course:   Chest pain in the setting of ischemic hear disease and s/p inferior MI in 06/2010 Resolved. Cardiac cath on 4/6 showed single vessel occlusive CAD with chronic total occlusion of the RCA with good collaterals.   Cholelithiasis - hepatobiliary scan is negative.   Transaminitis with elevated lipase level  Repeat levels improved. Recheck levels in 4 to 6 weeks.   Nonalcoholic cirrhosis with portal venous hypertension - with splenomegaly and splenic vein varices, a/p esophageal banding by Dr. Hilarie Fredrickson in 2015.follow up with GI in 4 weeks.    Thrombocytopenia - this is chronic problem, plt at baseline 50 - 60 K - no sings of active bleeding. Outpatient follow up.    Diabetes mellitus type 2, with complications of CAD - last A1C 12/2013 was > 8 - continue with long acting and SSI.  -  HTN CONTROLLED.  Hypokalemia - repleted   Morbid  obesity  - Body mass index is 43.74 kg/(m^2).  Procedures:  Cardiac cath.  Consultations:  cardiology  Discharge Exam: Filed Vitals:   10/16/14 1430  BP: 101/46  Pulse: 60  Temp: 97.7 F (36.5 C)  Resp: 18    General: alert afebrile comfortable Cardiovascular: s1s2 Respiratory: ctab  Discharge Instructions   Discharge Instructions    Diet - low sodium heart healthy    Complete by:  As directed      Discharge instructions    Complete by:  As directed   Follow up with PCP and cardiology as recommended.          Discharge Medication List as of 10/16/2014  3:36 PM    CONTINUE these medications which have NOT CHANGED   Details  aspirin 81 MG tablet Take 81 mg by mouth daily. , Until Discontinued, Historical Med    atenolol (TENORMIN) 50 MG tablet Take 50 mg by mouth daily., Until Discontinued, Historical Med    enalapril (VASOTEC) 20 MG tablet Take 20 mg by mouth daily., Until Discontinued, Historical Med    furosemide (LASIX) 40 MG tablet Take 40 mg by mouth daily. , Starting 01/09/2012, Until Discontinued, Historical Med    insulin NPH Human (HUMULIN N,NOVOLIN N) 100 UNIT/ML injection Inject 50-80 Units into the skin See admin instructions. Take 80 units in the morning and 50 units in the evening., Until Discontinued, Historical Med    lactulose (CHRONULAC) 10 GM/15ML solution Take 30 mLs (20 g total) by mouth  3 (three) times daily., Starting 01/13/2014, Until Discontinued, Normal    metFORMIN (GLUCOPHAGE) 500 MG tablet Take 1,000 mg by mouth 2 (two) times daily. , Until Discontinued, Historical Med    Multiple Vitamin (MULTIVITAMIN) capsule Take 1 capsule by mouth daily.  , Until Discontinued, Historical Med    nitroGLYCERIN (NITROSTAT) 0.4 MG SL tablet Place 1 tablet (0.4 mg total) under the tongue every 5 (five) minutes as needed., Starting 01/09/2012, Until Discontinued, Normal    sulfamethoxazole-trimethoprim (BACTRIM DS,SEPTRA DS) 800-160 MG per tablet Take 1  tablet by mouth daily., Until Discontinued, Historical Med    omeprazole (PRILOSEC) 40 MG capsule Take 1 capsule (40 mg total) by mouth daily., Starting 01/01/2014, Until Discontinued, Print    rifaximin (XIFAXAN) 550 MG TABS tablet Take 1 tablet (550 mg total) by mouth 2 (two) times daily., Starting 01/01/2014, Until Discontinued, Print      STOP taking these medications     nadolol (CORGARD) 20 MG tablet        No Known Allergies Follow-up Information    Follow up with [facility], MD. Schedule an appointment as soon as possible for a visit in 1 week.   Specialty:  Internal Medicine   Contact information:   [address] [place] [address] [phone]       Follow up with [facility].   Specialty:  General Surgery   Why:  As needed   Contact information:   [place] [address] [phone]       Follow up with Loralie Champagne, MD. Schedule an appointment as soon as possible for a visit in 4 weeks.   Specialty:  Cardiology   Contact information:   [address] [place] [address] [phone]        The results of significant diagnostics from this hospitalization (including imaging, microbiology, ancillary and laboratory) are listed below for reference.    Significant Diagnostic Studies: Dg Chest 2 View  10/14/2014   CLINICAL DATA:  Pain across the chest and shortness of breath tonight.  EXAM: CHEST  2 VIEW  COMPARISON:  01/12/2014  FINDINGS: The heart size and mediastinal contours are within normal limits. Both lungs are clear. The visualized skeletal structures are unremarkable.  IMPRESSION: No active cardiopulmonary disease.   Electronically Signed   By: Lucienne Capers M.D.   On: 10/14/2014 22:47   Nm Hepatobiliary Liver Func  10/16/2014   CLINICAL DATA:  Pain.  Cirrhosis.  EXAM: NUCLEAR MEDICINE HEPATOBILIARY IMAGING WITH GALLBLADDER EF  TECHNIQUE: Sequential images of the abdomen were obtained out  to 60 minutes following intravenous administration of radiopharmaceutical. After slow intravenous infusion of 2.6 micrograms Cholecystokinin, gallbladder ejection fraction was determined.  RADIOPHARMACEUTICALS:  5.0 Millicurie AC-16S Choletec  COMPARISON:  Ultrasound 10/15/2014.  FINDINGS: Liver, gallbladder, biliary system, bowel visualize normally. Gallbladder ejection fraction 45 minutes is 45.2%. This is normal. At 45 min, normal ejection fraction is greater than 40%. No reported symptoms following CCK administration  IMPRESSION: Normal exam.   Electronically Signed   By: Marcello Moores  Register   On: 10/16/2014 12:08   US Abdomen Complete  10/15/2014   CLINICAL DATA:  Chest pain. Diffuse abdominal pain in the right upper quadrant and left upper quadrant and midline. History of nonalcoholic cirrhosis. Diabetes and CHF.  EXAM: ULTRASOUND ABDOMEN COMPLETE  COMPARISON:  CT abdomen and pelvis 12/29/2013  FINDINGS: Gallbladder: Gallbladder is mildly distended with mild diffuse wall thickening. Small stones layering in  the gallbladder neck. Murphy's sign is negative.  Common bile duct: Diameter: 5.9 mm, normal  Liver: Heterogeneous parenchymal echotexture pattern with nodular contour consistent with history of cirrhosis. Limited color flow Doppler imaging of the main portal vein shows flow in the appropriate direction. Recanalized periumbilical vein is noted.  IVC: Not visualized.  Pancreas: Not visualized.  Spleen: Diffusely enlarged spleen. Volume calculates to 828 mL. Prominent vascular structures in the splenic hilum likely representing varices.  Right Kidney: Length: 11.9 cm. Echogenicity within normal limits. No mass or hydronephrosis visualized.  Left Kidney: Length: 12.5 cm. Limited visualization. Echogenicity within normal limits. No mass or hydronephrosis visualized.  Abdominal aorta: No aneurysm visualized.  Other findings: None.  IMPRESSION: Cholelithiasis with mild gallbladder distention and wall thickening.  Murphy's sign is negative. This is nonspecific for acute cholecystitis. Changes of hepatic cirrhosis with portal venous hypertension including splenic enlargement, splenic vein varices, and recanalized periumbilical vein.   Electronically Signed   By: Lucienne Capers M.D.   On: 10/15/2014 00:47    Microbiology: No results found for this or any previous visit (from the past 240 hour(s)).   Labs: Basic Metabolic Panel:  Recent Labs Lab 10/14/14 2248 10/15/14 0735 10/16/14 0523  NA 137 137 137  K 3.9 3.3* 3.8  CL 106 106 107  CO2 21 23 23   GLUCOSE 224* 78 160*  BUN 8 8 8   CREATININE 0.98 0.78 0.80  CALCIUM 8.6 8.6 8.2*   Liver Function Tests:  Recent Labs Lab 10/14/14 2248 10/15/14 0735 10/16/14 0523  AST 77* 69* 66*  ALT 55* 52 47  ALKPHOS 133* 104 86  BILITOT 1.9* 1.7* 1.8*  PROT 6.8 6.3 6.1  ALBUMIN 3.0* 2.9* 2.7*    Recent Labs Lab 10/14/14 2248 10/15/14 0735 10/16/14 0523  LIPASE 49 254* 51   No results for input(s): AMMONIA in the last 168 hours. CBC:  Recent Labs Lab 10/14/14 2248 10/15/14 0735 10/16/14 0523  WBC 6.4 5.7 3.9*  NEUTROABS 4.7 3.2  --   HGB 13.7 13.3 12.9*  HCT 40.6 38.9* 38.4*  MCV 100.2* 99.0 100.3*  PLT 54* 55* 49*   Cardiac Enzymes:  Recent Labs Lab 10/15/14 0630 10/15/14 0755  TROPONINI 0.04* 0.04*   BNP: BNP (last 3 results)  Recent Labs  10/14/14 2248  BNP 56.1    ProBNP (last 3 results) No results for input(s): PROBNP in the last 8760 hours.  CBG:  Recent Labs Lab 10/15/14 1433 10/15/14 1612 10/15/14 2105 10/16/14 0610 10/16/14 1243  GLUCAP 104* 102* 209* 155* 128*       Signed:  Parveen Freehling  [facility] 10/20/2014, 8:09 AM

## 2014-11-12 NOTE — Progress Notes (Signed)
Cardiology Office Note   Date:  11/13/2014   ID:  IKENNA OHMS, DOB 06-Aug-1955, MRN [id number]  PCP:  Antonietta Jewel, MD  Cardiologist:  Dr. Loralie Champagne     Chief Complaint  Patient presents with  . Hospitalization Follow-up    admitted 4/5-4/7 with chest pain  . Coronary Artery Disease     History of Present Illness: BRECKEN DEWOODY is a 59 y.o. male with a hx of history of diabetes mellitus type 2, hypertension, tobacco abuse, cyptogenic nonalcoholic cirrhosis of liver and hx of GI bleed. In 2011 he had an inferior wall myocardial infarction treated emergently with a bare-metal stent to the mid circumflex coronary artery. The patient was noted to have an 80% lesion in his right coronary artery which was not intervened upon. Hospitalization was c/b GI bleeding, hepatic encephalopathy, ventilator associated pneumonia and CHF.  He had a subsequent Myoview stress test on 01/13/11 which did not show any significant reversible ischemia and demonstrated an old stable inferolateral scar.  Last seen by Dr. Loralie Champagne 01/2012.  Admitted 4/5-4/7 with exertional chest pain.  Troponins were minimally elevated (0.04).  He was seen by cardiology and underwent cardiac cath.  This demonstrated a patent stent in the LCx and a chronically occluded proximal RCA, EF 45-50% with inferior HK.  Medical Rx was recommended.  LFTs were elevated.  US demonstrated cholelithiasis.  HIDA scan was negative.    He returns for FU.  Since discharge, he has been doing well. He still gets chest discomfort and shortness of breath with moderate activity. He denies syncope. He denies orthopnea or PND. He denies edema.   Studies/Reports Reviewed Today:  LHC 10/15/14 Coronary angiography: Coronary dominance: right Left mainstem: Normal.   Left anterior descending (LAD): Normal Left circumflex (LCx): The stent in the mid LCx is widely patent. There is no significant disease in this vessel.   Right coronary artery  (RCA): The RCA is occluded 100% proximally. There are good left to right collaterals to the entire vessel.   Left ventriculography: Left ventricular systolic function is abnormal. There is inferior hypokinesis.  LVEF is estimated at 45-50%, there is no significant mitral regurgitation   Echo 12/29/13 - Left ventricle: The cavity size was normal. Wall thickness was   increased in a pattern of mild LVH. There was mild focal basal   hypertrophy of the septum. Systolic function was normal. EF 50% to 55%. There   is hypokinesis of the inferolateral and inferior myocardium.   Doppler parameters are consistent with abnormal left ventricular   relaxation (grade 1 diastolic dysfunction). - Mitral valve: Calcified annulus. - Left atrium: The atrium was mildly dilated.  Impressions: Extremely limited due to poor sound wave transmission; there appears to be inferior, inferior lateral hypokinesis with overall preserved LV function but endocardium not well visualized; suggest cardiac MRI or MUGA if clinically indicated.   Past Medical History  Diagnosis Date  . Esophageal varices 04/2012  . Cirrhosis 2011    Cryptogenic, Likely NASH. Family/pt deny EtOH. HCV, HBV, HAV negative. ANA negative. AMA positive. Ascites 12/11  . Hyperlipemia   . Coronary artery disease     Inferior MI 12/11; LHC with occluded mid CFX and 80% proximal RCA. EF 55%. He had 3.0 x 28 vision BMS to CFX  . Diastolic CHF, acute     Echo 12/11 with ef 50-55% and mild LVH. EF 55% by LV0gram in 12/11  . Hypertension   . Type II diabetes mellitus 2011  .  SVT (supraventricular tachycardia)     1/12: appeared to be an ectopic atrial tachycardia. Required DCCV with hemodynamic instability  . GI bleed     12/11: Etiology not clearly defined. EGD: nonbleeding esophageal varices.  . S/P coronary artery stent placement 06/2010  . Esophageal varices 2011, 2013    no hx acute variceal bleed  . Hepatic encephalopathy 2011, 12/2013  .  Myocardial infarction ? 2012  . Shortness of breath dyspnea   . Arthritis     Past Surgical History  Procedure Laterality Date  . Cardiac catheterization  07/01/2010    BMS to CFX.  Marland Kitchen Appendectomy    . Orif r leg    . Esophagogastroduodenoscopy  04/13/2012    Procedure: ESOPHAGOGASTRODUODENOSCOPY (EGD);  Surgeon: Inda Castle, MD;  Location: [facility] ENDOSCOPY;  Service: Endoscopy;  Laterality: N/A;  . Colonoscopy  04/13/2012    Procedure: COLONOSCOPY;  Surgeon: Inda Castle, MD;  Location: [facility] ENDOSCOPY;  Service: Endoscopy;  Laterality: N/A;  . Esophagogastroduodenoscopy N/A 01/01/2014    Procedure: ESOPHAGOGASTRODUODENOSCOPY (EGD);  Surgeon: Jerene Bears, MD;  Location: [facility] ENDOSCOPY;  Service: Endoscopy;  Laterality: N/A;  . Esophageal banding N/A 01/01/2014    Procedure: ESOPHAGEAL BANDING;  Surgeon: Jerene Bears, MD;  Location: [facility] ENDOSCOPY;  Service: Endoscopy;  Laterality: N/A;  . Coronary stent placement  06/30/2010    CFX   Distal        . Left heart catheterization with coronary angiogram N/A 10/15/2014    Procedure: LEFT HEART CATHETERIZATION WITH CORONARY ANGIOGRAM;  Surgeon: Peter M Martinique, MD;  Location: [facility] CATH LAB;  Service: Cardiovascular;  Laterality: N/A;     Current Outpatient Prescriptions  Medication Sig Dispense Refill  . aspirin 81 MG tablet Take 81 mg by mouth daily.     Marland Kitchen atenolol (TENORMIN) 50 MG tablet Take 50 mg by mouth daily.    . enalapril (VASOTEC) 20 MG tablet Take 20 mg by mouth daily.    . furosemide (LASIX) 40 MG tablet Take 40 mg by mouth daily.     . insulin NPH Human (HUMULIN N,NOVOLIN N) 100 UNIT/ML injection Inject 50-80 Units into the skin See admin instructions. Take 80 units in the morning and 50 units in the evening.    . lactulose (CHRONULAC) 10 GM/15ML solution Take 30 mLs (20 g total) by mouth 3 (three) times daily. (Patient taking differently: Take 20 g by mouth 2 (two) times daily. ) 240 mL 1  . metFORMIN (GLUCOPHAGE) 500 MG tablet Take  1,000 mg by mouth 2 (two) times daily.     . Multiple Vitamin (MULTIVITAMIN) capsule Take 1 capsule by mouth daily.      . nitroGLYCERIN (NITROSTAT) 0.4 MG SL tablet Place 1 tablet (0.4 mg total) under the tongue every 5 (five) minutes as needed. 30 tablet 6  . omeprazole (PRILOSEC) 40 MG capsule Take 1 capsule (40 mg total) by mouth daily. 60 capsule All0  . sulfamethoxazole-trimethoprim (BACTRIM DS,SEPTRA DS) 800-160 MG per tablet Take 1 tablet by mouth daily.     No current facility-administered medications for this visit.    Allergies:   Review of patient's allergies indicates no known allergies.    Social History:  The patient  reports that he quit smoking about 17 months ago. His smoking use included Cigarettes. He has a 36 pack-year smoking history. He uses smokeless tobacco. He reports that he does not drink alcohol or use illicit drugs.   Family History:  The patient's family  history includes COPD in his mother; Diabetes in his brother and father; Heart attack in his father; Heart attack (age of onset: 66) in his brother; Heart attack (age of onset: 65) in his brother. There is no history of Stroke.    ROS:   Please see the history of present illness.   Review of Systems  Constitution: Positive for malaise/fatigue.  HENT: Positive for headaches.   Cardiovascular: Positive for chest pain and dyspnea on exertion.  Musculoskeletal: Positive for back pain.  Neurological: Positive for loss of balance.  Psychiatric/Behavioral: The patient is nervous/anxious.   All other systems reviewed and are negative.    PHYSICAL EXAM: VS:  BP 110/70 mmHg  Pulse 67  Ht 5' 7.5" (1.715 m)  Wt 288 lb (130.636 kg)  BMI 44.42 kg/m2    Wt Readings from Last 3 Encounters:  11/13/14 288 lb (130.636 kg)  10/16/14 284 lb 6.4 oz (129.003 kg)  01/13/14 280 lb 3.3 oz (127.1 kg)     GEN: Well nourished, well developed, in no acute distress HEENT: normal Neck: I cannot assess JVD,  no  masses Cardiac:  Normal S1/S2, RRR; no murmur ,  no rubs or gallops, no edema ; right wrist without hematoma or mass  Respiratory:  clear to auscultation bilaterally, no wheezing, rhonchi or rales. GI: soft, nontender, nondistended, + BS MS: no deformity or atrophy Skin: warm and dry  Neuro:  CNs II-XII intact, Strength and sensation are intact Psych: Normal affect   EKG:  EKG is ordered today.  It demonstrates:   NSR, HR 67, inferior Q waves, no change from prior tracing   Recent Labs: 12/29/2013: Magnesium 1.3*; TSH 1.630 10/14/2014: B Natriuretic Peptide 56.1 10/16/2014: ALT 47; BUN 8; Creatinine 0.80; Hemoglobin 12.9*; Platelets 49*; Potassium 3.8; Sodium 137    Lipid Panel    Component Value Date/Time   CHOL 132 03/15/2011 0827   TRIG 41 03/15/2011 0827   HDL 54 03/15/2011 0827   CHOLHDL 2.4 03/15/2011 0827   VLDL 8 03/15/2011 0827   LDLCALC 70 03/15/2011 0827      ASSESSMENT AND PLAN:  Coronary artery disease involving native coronary artery of native heart with angina pectoris He continues to have some exertional chest discomfort and shortness of breath. I suspect that this is related to the chronically occluded RCA. Dr. Martinique did not think he was an ideal candidate for CTO PCI given his elevated bleeding risk. Continue aspirin, beta blocker. I will place him on isosorbide 15 mg daily. He does not take PDE-5 inhibitors. We discussed the dangers of taking these medications with long-acting nitrates.  Essential hypertension Controlled.  Chronic diastolic CHF (congestive heart failure) Volume appears to be controlled. Continue current therapy.  HLD (hyperlipidemia) He is not on statin therapy. I presume that this is due to his cirrhosis. I have encouraged him to follow-up with gastroenterology. If he is felt to be an ideal candidate from gastroenterology's standpoint for statin therapy, I would try low-dose pravastatin.  Hepatic cirrhosis, unspecified hepatic cirrhosis  type Refer back to gastroenterology.  Ischemic cardiomyopathy EF 45-50% by left ventriculogram. I will obtain a 2-D echocardiogram to confirm his ejection fraction. Continue beta blocker, ACE inhibitor.  Current medicines are reviewed at length with the patient today.  Concerns regarding medicines are as outlined above.  The following changes have been made:    Imdur 15 mg daily   Labs/ tests ordered today include:  Orders Placed This Encounter  Procedures  . EKG 12-Lead  .  Echocardiogram    Disposition:   FU with me 3-4 weeks.    Signed, Versie Starks, MHS 11/13/2014 9:22 AM    [facility] [place], [place], [place]  [address] Phone: [phone]; Fax: [phone]

## 2014-11-13 ENCOUNTER — Encounter: Payer: Self-pay | Admitting: Physician Assistant

## 2014-11-13 ENCOUNTER — Ambulatory Visit (INDEPENDENT_AMBULATORY_CARE_PROVIDER_SITE_OTHER): Payer: PPO | Admitting: Physician Assistant

## 2014-11-13 VITALS — BP 110/70 | HR 67 | Ht 67.5 in | Wt 288.0 lb

## 2014-11-13 DIAGNOSIS — E785 Hyperlipidemia, unspecified: Secondary | ICD-10-CM | POA: Diagnosis not present

## 2014-11-13 DIAGNOSIS — I2510 Atherosclerotic heart disease of native coronary artery without angina pectoris: Secondary | ICD-10-CM

## 2014-11-13 DIAGNOSIS — I5032 Chronic diastolic (congestive) heart failure: Secondary | ICD-10-CM | POA: Diagnosis not present

## 2014-11-13 DIAGNOSIS — I25119 Atherosclerotic heart disease of native coronary artery with unspecified angina pectoris: Secondary | ICD-10-CM | POA: Diagnosis not present

## 2014-11-13 DIAGNOSIS — I255 Ischemic cardiomyopathy: Secondary | ICD-10-CM

## 2014-11-13 DIAGNOSIS — I10 Essential (primary) hypertension: Secondary | ICD-10-CM | POA: Diagnosis not present

## 2014-11-13 DIAGNOSIS — K7460 Unspecified cirrhosis of liver: Secondary | ICD-10-CM

## 2014-11-13 MED ORDER — ISOSORBIDE MONONITRATE ER 30 MG PO TB24: 15.0000 mg | ORAL_TABLET | Freq: Every day | ORAL | Status: DC

## 2014-11-13 NOTE — Patient Instructions (Signed)
Medication Instructions:  1. START IMDUR 30 MG TABLET WITH THE DIRECTIONS TO TAKE 1/2 TAB DAILY = 15 MG DAILY; RX SENT IN  Labwork: NONE  Testing/Procedures: Your physician has requested that you have an echocardiogram WITH CONTRAST. Echocardiography is a painless test that uses sound waves to create images of your heart. It provides your doctor with information about the size and shape of your heart and how well your heart's chambers and valves are working. This procedure takes approximately one hour. There are no restrictions for this procedure.  Follow-Up: 1. YOU HAVE A FOLLOW UP WITH SCOTT WEAVER, [facility] 6/2 @ 8:30 AM   2. YOU HAVE A FOLLOW UP WITH DR. PYRTLE 01/01/15 @ 2 PM  Any Other Special Instructions Will Be Listed Below (If Applicable).

## 2014-11-18 ENCOUNTER — Ambulatory Visit (HOSPITAL_COMMUNITY): Payer: PPO | Attending: Cardiovascular Disease

## 2014-11-18 ENCOUNTER — Encounter: Payer: Self-pay | Admitting: Physician Assistant

## 2014-11-18 ENCOUNTER — Other Ambulatory Visit: Payer: Self-pay

## 2014-11-18 DIAGNOSIS — E785 Hyperlipidemia, unspecified: Secondary | ICD-10-CM | POA: Insufficient documentation

## 2014-11-18 DIAGNOSIS — Z87891 Personal history of nicotine dependence: Secondary | ICD-10-CM | POA: Insufficient documentation

## 2014-11-18 DIAGNOSIS — I25119 Atherosclerotic heart disease of native coronary artery with unspecified angina pectoris: Secondary | ICD-10-CM

## 2014-11-18 DIAGNOSIS — E119 Type 2 diabetes mellitus without complications: Secondary | ICD-10-CM | POA: Diagnosis not present

## 2014-11-18 DIAGNOSIS — I2510 Atherosclerotic heart disease of native coronary artery without angina pectoris: Secondary | ICD-10-CM | POA: Insufficient documentation

## 2014-11-18 DIAGNOSIS — I255 Ischemic cardiomyopathy: Secondary | ICD-10-CM | POA: Diagnosis not present

## 2014-11-19 ENCOUNTER — Telehealth: Payer: Self-pay | Admitting: *Deleted

## 2014-11-19 NOTE — Telephone Encounter (Signed)
Pt notified of echo results with verbal understanding by phone today.

## 2014-12-02 ENCOUNTER — Encounter: Payer: Self-pay | Admitting: *Deleted

## 2014-12-10 NOTE — Progress Notes (Signed)
Cardiology Office Note   Date:  12/11/2014   ID:  Adam Butler, DOB [birth year], MRN [id number]  Patient Care Team: Antonietta Jewel, MD as PCP - General (Internal Medicine) Larey Dresser, MD as Consulting Physician (Cardiology) Jerene Bears, MD as Consulting Physician (Gastroenterology)     Chief Complaint  Patient presents with  . Coronary Artery Disease  . Follow-up    chest pain     History of Present Illness: Adam Butler is a 59 y.o. male with a hx of history of diabetes mellitus type 2, hypertension, tobacco abuse, cyptogenic nonalcoholic cirrhosis of liver and hx of GI bleed. In 2011 he had an inferior wall myocardial infarction treated emergently with a bare-metal stent to the mid circumflex coronary artery. The patient was noted to have an 80% lesion in his right coronary artery which was not intervened upon. Hospitalization was c/b GI bleeding, hepatic encephalopathy, ventilator associated pneumonia and CHF.  He had a subsequent Myoview stress test on 01/13/11 which did not show any significant reversible ischemia and demonstrated an old stable inferolateral scar.    Admitted 10/2014 with exertional chest pain with minimally elevated Troponins (0.04).  LHC demonstrated a patent stent in the LCx and a chronically occluded proximal RCA, EF 45-50% with inferior HK.  Medical Rx was recommended.  LFTs were elevated.  US demonstrated cholelithiasis.  HIDA scan was negative.    I saw him 11/13/14 in FU.  He continued to have exertional angina and I placed him on low dose Imdur 15 QD.  He underwent echo which demonstrated normal LVF.  He returns for FU.  He had significant headaches with the isosorbide and stopped the medication. He also had significant epistaxis. Interestingly, the epistaxis stopped after stopping the isosorbide.  He continues to have chest discomfort and shortness of breath with minimal activities. He is NYHA 2b-3. He describes CCS class 2-3 symptoms. He denies  orthopnea, PND or significant LE edema. He denies increasing abdominal girth. He denies syncope.    Studies/Reports Reviewed Today:  Echo 11/18/14 - EF 55%to 60%. Wall motion was normal;  - Left atrium: The atrium was moderately dilated. - Atrial septum: No defect or patent foramen ovale was identified.  LHC 10/15/14 Coronary angiography: Coronary dominance: right Left mainstem: Normal.   Left anterior descending (LAD): Normal Left circumflex (LCx): The stent in the mid LCx is widely patent. There is no significant disease in this vessel.   Right coronary artery (RCA): The RCA is occluded 100% proximally. There are good left to right collaterals to the entire vessel.   Left ventriculography: Left ventricular systolic function is abnormal. There is inferior hypokinesis.  LVEF is estimated at 45-50%, there is no significant mitral regurgitation   Echo 12/29/13 - Left ventricle: The cavity size was normal. Wall thickness was   increased in a pattern of mild LVH. There was mild focal basal   hypertrophy of the septum. Systolic function was normal. EF 50% to 55%. There   is hypokinesis of the inferolateral and inferior myocardium.   Doppler parameters are consistent with abnormal left ventricular   relaxation (grade 1 diastolic dysfunction). - Mitral valve: Calcified annulus. - Left atrium: The atrium was mildly dilated. Impressions: Extremely limited due to poor sound wave transmission; there appears to be inferior, inferior lateral hypokinesis with overall preserved LV function but endocardium not well visualized; suggest cardiac MRI or MUGA if clinically indicated.   Past Medical History  Diagnosis Date  . Cirrhosis 2011  Cryptogenic, Likely NASH. Family/pt deny EtOH. HCV, HBV, HAV negative. ANA negative. AMA positive. Ascites 12/11  . Hyperlipemia   . Coronary artery disease     Inferior MI 12/11; LHC with occluded mid CFX and 80% proximal RCA. EF 55%. He had 3.0 x 28 vision BMS to  CFX  . Diastolic CHF, acute     Echo 12/11 with ef 50-55% and mild LVH. EF 55% by LV0gram in 12/11  . Hypertension   . Type II diabetes mellitus 2011  . SVT (supraventricular tachycardia)     1/12: appeared to be an ectopic atrial tachycardia. Required DCCV with hemodynamic instability  . GI bleed     12/11: Etiology not clearly defined. EGD: nonbleeding esophageal varices.  . S/P coronary artery stent placement 06/2010  . Esophageal varices 2011, 2013    no hx acute variceal bleed  . Hepatic encephalopathy 2011, 12/2013  . Myocardial infarction ? 2012  . Shortness of breath dyspnea   . Arthritis   . Hx of echocardiogram     Echo 5/16:  EF 55-60%, no RWMA, mod LAE  . Thrombocytopenia   . Cholelithiasis   . Rectal varices     Past Surgical History  Procedure Laterality Date  . Cardiac catheterization  07/01/2010    BMS to CFX.  Marland Kitchen Appendectomy    . Orif r leg    . Esophagogastroduodenoscopy  04/13/2012    Procedure: ESOPHAGOGASTRODUODENOSCOPY (EGD);  Surgeon: Inda Castle, MD;  Location: [facility] END[facility];  Service: Endoscopy;  Laterality: N/A;  . Colonoscopy  04/13/2012    Procedure: COLONOSCOPY;  Surgeon: Inda Castle, MD;  Location: [facility] ENDOSCOPY;  Service: Endoscopy;  Laterality: N/A;  . Esophagogastroduodenoscopy N/A 01/01/2014    Procedure: ESOPHAGOGASTRODUODENOSCOPY (EGD);  Surgeon: Jerene Bears, MD;  Location: [facility] ENDOSCOPY;  Service: Endoscopy;  Laterality: N/A;  . Esophageal banding N/A 01/01/2014    Procedure: ESOPHAGEAL BANDING;  Surgeon: Jerene Bears, MD;  Location: [facility] ENDOSCOPY;  Service: Endoscopy;  Laterality: N/A;  . Coronary stent placement  06/30/2010    CFX   Distal        . Left heart catheterization with coronary angiogram N/A 10/15/2014    Procedure: LEFT HEART CATHETERIZATION WITH CORONARY ANGIOGRAM;  Surgeon: Peter M Martinique, MD;  Location: [facility] CATH LAB;  Service: Cardiovascular;  Laterality: N/A;     Current Outpatient Prescriptions  Medication Sig Dispense  Refill  . aspirin 81 MG tablet Take 81 mg by mouth daily.     Marland Kitchen atenolol (TENORMIN) 50 MG tablet Take 50 mg by mouth daily.    . enalapril (VASOTEC) 20 MG tablet Take 20 mg by mouth daily.    . furosemide (LASIX) 40 MG tablet Take 40 mg by mouth daily.     . insulin NPH Human (HUMULIN N,NOVOLIN N) 100 UNIT/ML injection Inject 50-80 Units into the skin See admin instructions. Take 80 units in the morning and 50 units in the evening.    . lactulose (CHRONULAC) 10 GM/15ML solution Take 30 mLs (20 g total) by mouth 3 (three) times daily. (Patient taking differently: Take 20 g by mouth 2 (two) times daily. ) 240 mL 1  . metFORMIN (GLUCOPHAGE) 1000 MG tablet Take 1,000 mg by mouth 2 (two) times daily.  11  . Multiple Vitamin (MULTIVITAMIN) capsule Take 1 capsule by mouth daily.      . nitroGLYCERIN (NITROSTAT) 0.4 MG SL tablet Place 1 tablet (0.4 mg total) under the tongue every 5 (five) minutes as  needed. 30 tablet 6  . omeprazole (PRILOSEC) 40 MG capsule Take 1 capsule (40 mg total) by mouth daily. 60 capsule All0  . sulfamethoxazole-trimethoprim (BACTRIM DS,SEPTRA DS) 800-160 MG per tablet Take 1 tablet by mouth daily.    . ranolazine (RANEXA) 500 MG 12 hr tablet Take 1 tablet (500 mg total) by mouth 2 (two) times daily. 60 tablet 11   No current facility-administered medications for this visit.    Allergies:   Review of patient's allergies indicates no known allergies.    Social History:  The patient  reports that he quit smoking about 18 months ago. His smoking use included Cigarettes. He has a 36 pack-year smoking history. He uses smokeless tobacco. He reports that he does not drink alcohol or use illicit drugs.   Family History:  The patient's family history includes COPD in his mother; Diabetes in his brother and father; Heart attack in his father; Heart attack (age of onset: 89) in his brother; Heart attack (age of onset: 69) in his brother. There is no history of Stroke.    ROS:     Please see the history of present illness.   Review of Systems  HENT: Positive for headaches and nosebleeds.   Cardiovascular: Positive for chest pain, dyspnea on exertion and orthopnea.  Respiratory: Positive for cough and wheezing.   Hematologic/Lymphatic: Positive for bleeding problem. Bruises/bleeds easily.  All other systems reviewed and are negative.    PHYSICAL EXAM: VS:  BP 100/58 mmHg  Pulse 70  Ht 5' 7.5" (1.715 m)  Wt 288 lb (130.636 kg)  BMI 44.42 kg/m2    Wt Readings from Last 3 Encounters:  12/11/14 288 lb (130.636 kg)  11/13/14 288 lb (130.636 kg)  10/16/14 284 lb 6.4 oz (129.003 kg)     GEN: Well nourished, well developed, in no acute distress HEENT: normal Neck: I cannot assess JVD,  no masses Cardiac:  Normal S1/S2, RRR; no murmur ,  no rubs or gallops, no edema   Respiratory:  clear to auscultation bilaterally, no wheezing, rhonchi or rales. GI: soft, nontender, nondistended, + BS MS: no deformity or atrophy Skin: warm and dry  Neuro:  CNs II-XII intact, Strength and sensation are intact Psych: Normal affect   EKG:  EKG is ordered today.  It demonstrates:   NSR, HR 70, inferior Q waves, no change from prior tracing   Recent Labs: 12/29/2013: Magnesium 1.3*; TSH 1.630 10/14/2014: B Natriuretic Peptide 56.1 10/16/2014: ALT 47; BUN 8; Creatinine 0.80; Hemoglobin 12.9*; Platelets 49*; Potassium 3.8; Sodium 137    Lipid Panel    Component Value Date/Time   CHOL 132 03/15/2011 0827   TRIG 41 03/15/2011 0827   HDL 54 03/15/2011 0827   CHOLHDL 2.4 03/15/2011 0827   VLDL 8 03/15/2011 0827   LDLCALC 70 03/15/2011 0827      ASSESSMENT AND PLAN:  Coronary artery disease involving native coronary artery of native heart with angina pectoris:  He continues to have exertional chest discomfort and shortness of breath. I suspect that this is related to the chronically occluded RCA. Dr. Martinique did not think he was an ideal candidate for CTO PCI given his elevated  bleeding risk. Continue aspirin, beta blocker. I will place him on Ranexa 500 mg twice a day. If he does not have symptomatic improvement with this, he may need to be seen in follow-up by Dr. Martinique or Dr. Irish Lack to see if CTO PCI could be considered.   Essential hypertension:  Controlled.  Chronic diastolic CHF (congestive heart failure):  He did have a 4 pound weight gain recently. I will give him 1 extra dose of Lasix today. Check a BMET, BNP, CBC.  HLD (hyperlipidemia):  He is not on statin therapy. I presume that this is due to his cirrhosis. I have encouraged him to follow-up with gastroenterology. If he is felt to be an ideal candidate from gastroenterology's standpoint for statin therapy, I would try low-dose pravastatin. He sees Dr.Pyrtle later thi Epistaxiss month.  Hepatic cirrhosis, unspecified hepatic cirrhosis type:  FU with GI as noted.    Ischemic cardiomyopathy:  EF 45-50% by left ventriculogram. Echo demonstrated normal LV function with normal wall motion.  Continue beta blocker, ACE inhibitor.  Epistaxis:  Resolved.  Check CBC.  I advised him to seek earlier FU if this should happen again.  It sounds like he had a significant bleed.   Tobacco Abuse:  I advised him to quit.   Current medicines are reviewed at length with the patient today.  Concerns regarding medicines are as outlined above.  The following changes have been made:    Stop Imdur  Start Ranexa 500 mg Twice daily    Labs/ tests ordered today include:  Orders Placed This Encounter  Procedures  . Basic Metabolic Panel (BMET)  . CBC w/Diff  . B Nat Peptide  . EKG 12-Lead    Disposition:   FU Dr. Loralie Champagne 4-5 weeks.    Signed, Versie Starks, MHS 12/11/2014 9:08 AM    [facility] [address], [place], [place]  [address] Phone: [phone]; Fax: [phone]

## 2014-12-11 ENCOUNTER — Ambulatory Visit (INDEPENDENT_AMBULATORY_CARE_PROVIDER_SITE_OTHER): Payer: PPO | Admitting: Physician Assistant

## 2014-12-11 ENCOUNTER — Encounter: Payer: Self-pay | Admitting: Physician Assistant

## 2014-12-11 VITALS — BP 100/58 | HR 70 | Ht 67.5 in | Wt 288.0 lb

## 2014-12-11 DIAGNOSIS — I25119 Atherosclerotic heart disease of native coronary artery with unspecified angina pectoris: Secondary | ICD-10-CM

## 2014-12-11 DIAGNOSIS — K7460 Unspecified cirrhosis of liver: Secondary | ICD-10-CM | POA: Diagnosis not present

## 2014-12-11 DIAGNOSIS — R040 Epistaxis: Secondary | ICD-10-CM

## 2014-12-11 DIAGNOSIS — Z720 Tobacco use: Secondary | ICD-10-CM

## 2014-12-11 DIAGNOSIS — E785 Hyperlipidemia, unspecified: Secondary | ICD-10-CM

## 2014-12-11 DIAGNOSIS — I5032 Chronic diastolic (congestive) heart failure: Secondary | ICD-10-CM | POA: Diagnosis not present

## 2014-12-11 DIAGNOSIS — I10 Essential (primary) hypertension: Secondary | ICD-10-CM

## 2014-12-11 LAB — CBC WITH DIFFERENTIAL/PLATELET
BASOS PCT: 0.4 % (ref 0.0–3.0)
Basophils Absolute: 0 10*3/uL (ref 0.0–0.1)
EOS ABS: 0.3 10*3/uL (ref 0.0–0.7)
Eosinophils Relative: 5.3 % — ABNORMAL HIGH (ref 0.0–5.0)
HCT: 38.8 % — ABNORMAL LOW (ref 39.0–52.0)
Hemoglobin: 13.3 g/dL (ref 13.0–17.0)
LYMPHS PCT: 19.2 % (ref 12.0–46.0)
Lymphs Abs: 1.1 10*3/uL (ref 0.7–4.0)
MCHC: 34.2 g/dL (ref 30.0–36.0)
MCV: 99.7 fl (ref 78.0–100.0)
MONO ABS: 0.5 10*3/uL (ref 0.1–1.0)
MONOS PCT: 9.3 % (ref 3.0–12.0)
NEUTROS PCT: 65.8 % (ref 43.0–77.0)
Neutro Abs: 3.8 10*3/uL (ref 1.4–7.7)
PLATELETS: 65 10*3/uL — AB (ref 150.0–400.0)
RBC: 3.89 Mil/uL — ABNORMAL LOW (ref 4.22–5.81)
RDW: 14.6 % (ref 11.5–15.5)
WBC: 5.7 10*3/uL (ref 4.0–10.5)

## 2014-12-11 LAB — BASIC METABOLIC PANEL
BUN: 10 mg/dL (ref 6–23)
CALCIUM: 8.8 mg/dL (ref 8.4–10.5)
CHLORIDE: 106 meq/L (ref 96–112)
CO2: 22 mEq/L (ref 19–32)
CREATININE: 0.72 mg/dL (ref 0.40–1.50)
GFR: 118.86 mL/min (ref >60.00)
GLUCOSE: 200 mg/dL — AB (ref 70–99)
POTASSIUM: 4.1 meq/L (ref 3.5–5.1)
SODIUM: 133 meq/L — AB (ref 135–145)

## 2014-12-11 LAB — BRAIN NATRIURETIC PEPTIDE: Pro B Natriuretic peptide (BNP): 38 pg/mL (ref 0.0–100.0)

## 2014-12-11 MED ORDER — RANOLAZINE ER 500 MG PO TB12: 500.0000 mg | ORAL_TABLET | Freq: Two times a day (BID) | ORAL | Status: DC

## 2014-12-11 NOTE — Patient Instructions (Signed)
Medication Instructions:  1. STOP IMDUR  2. START RANEXA 500 MG 1 TABLET EVERY 12 HOURS ; RX SENT IN TODAY  3. TODAY ONLY TAKE EXTRA LASIX 40 MG  Labwork: TODAY BMET, CBC W/DIFF, BNP  Testing/Procedures: NONE  Follow-Up: YOU WILL NEED TO FOLLOW UP WITH DR. Aundra Dubin IN 4-5 WEEKS; I WILL HAVE DR. MCLEAN'S NURSE CALL YOU WITH AN APPT  Any Other Special Instructions Will Be Listed Below (If Applicable).

## 2014-12-12 ENCOUNTER — Telehealth: Payer: Self-pay | Admitting: *Deleted

## 2014-12-12 NOTE — Telephone Encounter (Signed)
Pt notified of lab results with verbal understanding by phone to results given. Pt advised to keep his f/u w/Dr. Hilarie Fredrickson as planned.

## 2015-01-01 ENCOUNTER — Ambulatory Visit (INDEPENDENT_AMBULATORY_CARE_PROVIDER_SITE_OTHER): Payer: PPO | Admitting: Internal Medicine

## 2015-01-01 ENCOUNTER — Encounter: Payer: Self-pay | Admitting: Internal Medicine

## 2015-01-01 VITALS — BP 100/60 | HR 64 | Ht 66.5 in | Wt 284.4 lb

## 2015-01-01 DIAGNOSIS — K7290 Hepatic failure, unspecified without coma: Secondary | ICD-10-CM

## 2015-01-01 DIAGNOSIS — I8500 Esophageal varices without bleeding: Secondary | ICD-10-CM

## 2015-01-01 DIAGNOSIS — K7581 Nonalcoholic steatohepatitis (NASH): Secondary | ICD-10-CM | POA: Diagnosis not present

## 2015-01-01 DIAGNOSIS — K766 Portal hypertension: Secondary | ICD-10-CM

## 2015-01-01 DIAGNOSIS — K7682 Hepatic encephalopathy: Secondary | ICD-10-CM

## 2015-01-01 DIAGNOSIS — K7460 Unspecified cirrhosis of liver: Secondary | ICD-10-CM

## 2015-01-01 DIAGNOSIS — K8020 Calculus of gallbladder without cholecystitis without obstruction: Secondary | ICD-10-CM

## 2015-01-01 MED ORDER — RIFAXIMIN 550 MG PO TABS: 550.0000 mg | ORAL_TABLET | Freq: Two times a day (BID) | ORAL | Status: DC

## 2015-01-01 MED ORDER — NADOLOL 40 MG PO TABS: 40.0000 mg | ORAL_TABLET | Freq: Every day | ORAL | Status: DC

## 2015-01-01 NOTE — Patient Instructions (Addendum)
We have sent the following medications to your pharmacy for you to pick up at your convenience: Stop Atenalol and start Nadolol 19m daily. Xiafaxin 5563mTwice daily.  If this is to expensive or insurance does not cover this stay on Lactulose.  Do not drink Alcohol.  Follow up in 6 months.  Low-Sodium Eating Plan Sodium raises blood pressure and causes water to be held in the body. Getting less sodium from food will help lower your blood pressure, reduce any swelling, and protect your heart, liver, and kidneys. We get sodium by adding salt (sodium chloride) to food. Most of our sodium comes from canned, boxed, and frozen foods. Restaurant foods, fast foods, and pizza are also very high in sodium. Even if you take medicine to lower your blood pressure or to reduce fluid in your body, getting less sodium from your food is important. WHAT IS MY PLAN? Most people should limit their sodium intake to 2,300 mg a day.  WHAT DO I NEED TO KNOW ABOUT THIS EATING PLAN? For the low-sodium eating plan, you will follow these general guidelines:  Choose foods with a % Daily Value for sodium of less than 5% (as listed on the food label).   Use salt-free seasonings or herbs instead of table salt or sea salt.   Check with your health care provider or pharmacist before using salt substitutes.   Eat fresh foods.  Eat more vegetables and fruits.  Limit canned vegetables. If you do use them, rinse them well to decrease the sodium.   Limit cheese to 1 oz (28 g) per day.   Eat lower-sodium products, often labeled as "lower sodium" or "no salt added."  Avoid foods that contain monosodium glutamate (MSG). MSG is sometimes added to [place] and some canned foods.  Check food labels (Nutrition Facts labels) on foods to learn how much sodium is in one serving.  Eat more home-cooked food and less restaurant, buffet, and fast food.  When eating at a restaurant, ask that your food be prepared with  less salt or none, if possible.  HOW DO I READ FOOD LABELS FOR SODIUM INFORMATION? The Nutrition Facts label lists the amount of sodium in one serving of the food. If you eat more than one serving, you must multiply the listed amount of sodium by the number of servings. Food labels may also identify foods as:  Sodium free--Less than 5 mg in a serving.  Very low sodium--35 mg or less in a serving.  Low sodium--140 mg or less in a serving.  Light in sodium--50% less sodium in a serving. For example, if a food that usually has 300 mg of sodium is changed to become light in sodium, it will have 150 mg of sodium.  Reduced sodium--25% less sodium in a serving. For example, if a food that usually has 400 mg of sodium is changed to reduced sodium, it will have 300 mg of sodium. WHAT FOODS CAN I EAT? Grains Low-sodium cereals, including oats, puffed wheat and rice, and shredded wheat cereals. Low-sodium crackers. Unsalted rice and pasta. Lower-sodium bread.  Vegetables Frozen or fresh vegetables. Low-sodium or reduced-sodium canned vegetables. Low-sodium or reduced-sodium tomato sauce and paste. Low-sodium or reduced-sodium tomato and vegetable juices.  Fruits Fresh, frozen, and canned fruit. Fruit juice.  Meat and Other Protein Products Low-sodium canned tuna and salmon. Fresh or frozen meat, poultry, seafood, and fish. Lamb. Unsalted nuts. Dried beans, peas, and lentils without added salt. Unsalted canned beans. Homemade soups without  salt. Eggs.  Dairy Milk. Soy milk. Ricotta cheese. Low-sodium or reduced-sodium cheeses. Yogurt.  Condiments Fresh and dried herbs and spices. Salt-free seasonings. Onion and garlic powders. Low-sodium varieties of mustard and ketchup. Lemon juice.  Fats and Oils Reduced-sodium salad dressings. Unsalted butter.  Other Unsalted popcorn and pretzels.  The items listed above may not be a complete list of recommended foods or beverages. Contact your  dietitian for more options. WHAT FOODS ARE NOT RECOMMENDED? Grains Instant hot cereals. Bread stuffing, pancake, and biscuit mixes. Croutons. Seasoned rice or pasta mixes. Noodle soup cups. Boxed or frozen macaroni and cheese. Self-rising flour. Regular salted crackers. Vegetables Regular canned vegetables. Regular canned tomato sauce and paste. Regular tomato and vegetable juices. Frozen vegetables in sauces. Salted french fries. Olives. Angie Fava. Relishes. Sauerkraut. Salsa. Meat and Other Protein Products Salted, canned, smoked, spiced, or pickled meats, seafood, or fish. Bacon, ham, sausage, hot dogs, corned beef, chipped beef, and packaged luncheon meats. Salt pork. Jerky. Pickled herring. Anchovies, regular canned tuna, and sardines. Salted nuts. Dairy Processed cheese and cheese spreads. Cheese curds. Blue cheese and cottage cheese. Buttermilk.  Condiments Onion and garlic salt, seasoned salt, table salt, and sea salt. Canned and packaged gravies. Worcestershire sauce. Tartar sauce. Barbecue sauce. Teriyaki sauce. Soy sauce, including reduced sodium. Steak sauce. Fish sauce. Oyster sauce. Cocktail sauce. Horseradish. Regular ketchup and mustard. Meat flavorings and tenderizers. Bouillon cubes. Hot sauce. Tabasco sauce. Marinades. Taco seasonings. Relishes. Fats and Oils Regular salad dressings. Salted butter. Margarine. Ghee. Bacon fat.  Other Potato and tortilla chips. Corn chips and puffs. Salted popcorn and pretzels. Canned or dried soups. Pizza. Frozen entrees and pot pies.  The items listed above may not be a complete list of foods and beverages to avoid. Contact your dietitian for more information. Document Released: 12/17/2001 Document Revised: 07/02/2013 Document Reviewed: 05/01/2013 [facility] Patient Information 2015 [place], [place]. This information is not intended to replace advice given to you by your health care provider. Make sure you discuss any questions you have with  your health care provider.

## 2015-01-01 NOTE — Progress Notes (Signed)
Subjective:    Patient ID: Adam Butler, male    DOB: 04-11-1956, 59 y.o.   MRN: [id number]  HPI Adam Butler is a 59 yo male with a past medical history of cirrhosis felt secondary to NASH, complicated by portal hypertension with history of small esophageal varices, rectal varices, ascites, thrombocytopenia, history of encephalopathy who is seen in follow-up. I initially met him during hospitalization one year ago but he was previous followed by Dr. Deatra Ina. He also has a history of coronary artery disease, hyperlipidemia, diastolic heart failure, hypertension, diabetes, and gallstones. He is here alone today. He is also followed by Dr. Aundra Dubin with cardiology and sees Dr. Sheryle Hail for primary care.    He reports overall he is feeling well today. He denies abdominal pain. He denies increasing abdominal distention or lower extremity edema. He uses Lasix 40 mg daily. He denies jaundice. No rectal bleeding or melena. No chest pain or dyspnea today. He does continue lactulose 30 mL's twice daily which causes diarrhea. Some days he has more diarrhea than others. He reports a good appetite with no nausea or vomiting. Denies dysphagia  He was seen in the hospital in April 2016 with upper abdominal or lower chest pain. Cardiac cause was ruled out. Of note lipase was acutely elevated to 250. Ultrasound showed gallstones. HIDA scan was negative.  EGD -- 01/01/2014 -- small esophageal varices in the middle and distal third of the esophagus which completely flatten with insufflation. Normal stomach. Normal duodenum. Colonoscopy 04/13/2012 -- portable hypertensive colopathy, rectal varices and some friable mucosa. Most noticeable in the right colon. No polyps were seen.    Review of Systems As per HPI, otherwise negative  Current Medications, Allergies, Past Medical History, Past Surgical History, Family History and Social History were reviewed in [employer] record.       Objective:   Physical Exam BP 100/60 mmHg  Pulse 64  Ht 5' 6.5" (1.689 m)  Wt 284 lb 6 oz (128.992 kg)  BMI 45.22 kg/m2 Constitutional: Well-developed and well-nourished. No distress. HEENT: Normocephalic and atraumatic. Oropharynx is clear and moist. No oropharyngeal exudate. Conjunctivae are normal.  No scleral icterus. Neck: Neck supple. Trachea midline. Cardiovascular: Normal rate, regular rhythm and intact distal pulses. No M/R/G Pulmonary/chest: Effort normal and breath sounds normal. No wheezing, rales or rhonchi. Abdominal: Soft, obese, nontender, nondistended. Bowel sounds active throughout.  Extremities: no clubbing, cyanosis, very slight/trace pretibial edema Lymphadenopathy: No cervical adenopathy noted. Neurological: Alert and oriented to person place and time. No asterixis Skin: Skin is warm and dry. No rashes noted. Psychiatric: Normal mood and affect. Behavior is normal.  CBC    Component Value Date/Time   WBC 5.7 12/11/2014 0913   RBC 3.89* 12/11/2014 0913   HGB 13.3 12/11/2014 0913   HCT 38.8* 12/11/2014 0913   PLT 65.0* 12/11/2014 0913   MCV 99.7 12/11/2014 0913   MCH 33.7 10/16/2014 0523   MCHC 34.2 12/11/2014 0913   RDW 14.6 12/11/2014 0913   LYMPHSABS 1.1 12/11/2014 0913   MONOABS 0.5 12/11/2014 0913   EOSABS 0.3 12/11/2014 0913   BASOSABS 0.0 12/11/2014 0913    CMP     Component Value Date/Time   NA 133* 12/11/2014 0913   K 4.1 12/11/2014 0913   CL 106 12/11/2014 0913   CO2 22 12/11/2014 0913   GLUCOSE 200* 12/11/2014 0913   BUN 10 12/11/2014 0913   CREATININE 0.72 12/11/2014 0913   CREATININE 0.74 03/15/2011 0827   CALCIUM  8.8 12/11/2014 0913   PROT 6.1 10/16/2014 0523   ALBUMIN 2.7* 10/16/2014 0523   AST 66* 10/16/2014 0523   ALT 47 10/16/2014 0523   ALKPHOS 86 10/16/2014 0523   BILITOT 1.8* 10/16/2014 0523   GFRNONAA >90 10/16/2014 0523   GFRAA >90 10/16/2014 0523    Lab Results  Component Value Date   INR 1.20 10/15/2014   INR  1.27 01/13/2014   INR 1.20 12/29/2013   NUCLEAR MEDICINE HEPATOBILIARY IMAGING WITH GALLBLADDER EF   TECHNIQUE: Sequential images of the abdomen were obtained out to 60 minutes following intravenous administration of radiopharmaceutical. After slow intravenous infusion of 2.6 micrograms Cholecystokinin, gallbladder ejection fraction was determined.   RADIOPHARMACEUTICALS:  5.0 Millicurie [id number] Choletec   COMPARISON:  Ultrasound 10/15/2014.   FINDINGS: Liver, gallbladder, biliary system, bowel visualize normally. Gallbladder ejection fraction 45 minutes is 45.2%. This is normal. At 45 min, normal ejection fraction is greater than 40%. No reported symptoms following CCK administration   IMPRESSION: Normal exam.     Electronically Signed   By: Marcello Moores  Register   On: 10/16/2014 12:08   ____________________________________________________________________________ EXAM: ULTRASOUND ABDOMEN COMPLETE   COMPARISON:  CT abdomen and pelvis 12/29/2013   FINDINGS: Gallbladder: Gallbladder is mildly distended with mild diffuse wall thickening. Small stones layering in the gallbladder neck. Murphy's sign is negative.   Common bile duct: Diameter: 5.9 mm, normal   Liver: Heterogeneous parenchymal echotexture pattern with nodular contour consistent with history of cirrhosis. Limited color flow Doppler imaging of the main portal vein shows flow in the appropriate direction. Recanalized periumbilical vein is noted.   IVC: Not visualized.   Pancreas: Not visualized.   Spleen: Diffusely enlarged spleen. Volume calculates to 828 mL. Prominent vascular structures in the splenic hilum likely representing varices.   Right Kidney: Length: 11.9 cm. Echogenicity within normal limits. No mass or hydronephrosis visualized.   Left Kidney: Length: 12.5 cm. Limited visualization. Echogenicity within normal limits. No mass or hydronephrosis visualized.   Abdominal aorta: No aneurysm  visualized.   Other findings: None.   IMPRESSION: Cholelithiasis with mild gallbladder distention and wall thickening. Murphy's sign is negative. This is nonspecific for acute cholecystitis. Changes of hepatic cirrhosis with portal venous hypertension including splenic enlargement, splenic vein varices, and recanalized periumbilical vein.     Electronically Signed   By: Lucienne Capers M.D.   On: 10/15/2014 00:47      Assessment & Plan:   59 yo male with a past medical history of cirrhosis felt secondary to NASH, complicated by portal hypertension with history of small esophageal varices, rectal varices, ascites, thrombocytopenia, history of encephalopathy who is seen in follow-up.   1. NASH cirrhosis with portal HTN -- fairly well compensated at this time. No evidence for ascites or lower extremity edema. --HCC screening -- ultrasound from April 2016 no liver lesions, repeat October 2016 --Variceal screening -- up-to-date, last endoscopy 2015 -- small varices. At that time I recommend nadolol rather than atenolol for prophylaxis. He states he was on both for some time and so nadolol was discontinued. Am switching him back to nadolol 40 mg daily. Discontinue atenolol. --Hepatic encephalopathy -- rifaximin 550 mg twice a day in place of lactulose given diarrhea. If he cannot afford rifaximin, switch back to lactulose. No evidence for encephalopathy today --Low sodium diet --No alcohol --Follow-up in 6 months  2. Cholelithiasis -- pain from April 2016 was likely related to CBD stone which subsequently passed as evidenced by elevated lipase and symptoms. No  evidence for ongoing issues. I asked that he notify me if he develops recurrent upper abdominal pain.  3. CRC screening -- no polyps as of October 2013, repeat 2023  Follow-up in 6 months, sooner if necessary

## 2015-01-05 ENCOUNTER — Other Ambulatory Visit: Payer: Self-pay

## 2015-01-05 ENCOUNTER — Telehealth: Payer: Self-pay | Admitting: Internal Medicine

## 2015-01-05 NOTE — Telephone Encounter (Signed)
Per [facility] the pt will be contacted about financial assistance.

## 2015-01-06 ENCOUNTER — Telehealth: Payer: Self-pay | Admitting: Internal Medicine

## 2015-01-07 NOTE — Telephone Encounter (Signed)
[facility] working on pt assistance on Xifaxan for pt.

## 2015-01-15 ENCOUNTER — Ambulatory Visit (INDEPENDENT_AMBULATORY_CARE_PROVIDER_SITE_OTHER): Payer: PPO | Admitting: Cardiology

## 2015-01-15 ENCOUNTER — Encounter: Payer: Self-pay | Admitting: Cardiology

## 2015-01-15 VITALS — BP 100/50 | HR 60 | Ht 66.5 in | Wt 282.0 lb

## 2015-01-15 DIAGNOSIS — E785 Hyperlipidemia, unspecified: Secondary | ICD-10-CM

## 2015-01-15 DIAGNOSIS — I25119 Atherosclerotic heart disease of native coronary artery with unspecified angina pectoris: Secondary | ICD-10-CM

## 2015-01-15 DIAGNOSIS — I2510 Atherosclerotic heart disease of native coronary artery without angina pectoris: Secondary | ICD-10-CM | POA: Diagnosis not present

## 2015-01-15 DIAGNOSIS — K7460 Unspecified cirrhosis of liver: Secondary | ICD-10-CM | POA: Diagnosis not present

## 2015-01-15 DIAGNOSIS — I209 Angina pectoris, unspecified: Secondary | ICD-10-CM

## 2015-01-15 MED ORDER — ATORVASTATIN CALCIUM 10 MG PO TABS: 10.0000 mg | ORAL_TABLET | Freq: Every day | ORAL | Status: DC

## 2015-01-15 MED ORDER — RANOLAZINE ER 500 MG PO TB12: 1000.0000 mg | ORAL_TABLET | Freq: Two times a day (BID) | ORAL | Status: DC

## 2015-01-15 MED ORDER — OMEPRAZOLE 20 MG PO CPDR: 20.0000 mg | DELAYED_RELEASE_CAPSULE | Freq: Every day | ORAL | Status: DC

## 2015-01-15 NOTE — Patient Instructions (Addendum)
Medication Instructions:   START TAKING RENEXA 1000 MG TWICE A DAY   START TAKING OMEPRAZOLE 20 MG ONCE A DAY   START TAKING ATORVASTATIN 10 MG ONCE A DAY   Labwork:  LFT IN ONE MONTH   LFT AND  LIPIDS IN 2 MONTHS    Testing/Procedures:    Follow-Up:  WITH DR [facility] IN 3 MONTHS PER DR [facility]     Any Other Special Instructions Will Be Listed Below (If Applicable).

## 2015-01-15 NOTE — Progress Notes (Signed)
Patient ID: Adam Butler, male   DOB: 1955-09-04, 59 y.o.   MRN: [id number] PCP: Dr. Pattricia Butler, Adam Butler   59 yo with CAD and cirrhosis from NASH returns for followup.  He entered the hospital in 12/11 with an inferior MI and had a bare metal stent to an occluded circumflex.  His hospitalization was then complicated by GI bleeding requiring transfusion and altered mental status thought to be due to hepatic encephalopathy.  He was intubated due to altered mental status to protect his airway.  He developed ventilator-associated PNA and CHF, requiring antibiotics and diuresis.  He was noted to have findings consistent with cirrhosis on imaging, but family/patient denied ETOH and viral hepatitis workup was unrevealing.  He was finally discharged home after a prolonged course.  He has had trouble getting medical care due to lack of insurance.  His AMA titer was elevated, raising the question of primary biliary cirrhosis. Given the residual RCA stenosis on catheterization in 12/11, I had him do a Lexiscan myoview in 7/12 to look for ischemia.  This showed moderate inferolateral scar consistent with prior MI and no ischemia.   He had unstable angina in 4/16.  LHC showed total occlusion of the RCA.  He was thought not to be a good candidate for CTO opening due to bleeding risk.    Adam Butler has quit smoking.  He had been having some exertional chest pain ever since 4/16.  However, this has been improved with use of ranolazine.  He was unable to tolerate Imdur.  He will get mild tightness in his central chest walking up a steep hill.  He is short of breath after walking about 200 feet on flat ground.  He has some chest burning on occasion when he lies down in bed at night.    Labs (12/11): HBsAg negative, HAV ab negative, HCV ab negative, HBcAb negative, HVC RNA negative, ceruloplasmin level normal, ANA negative, anti-mitochondrial ab level elevated.  Labs (1/12): K 3.9, creatinine 0.75, HCT 28.1, AST 40, ALT  29 Labs (2/12): K 4.6, creatinine 0.9, ALT 23, AST 33 Labs (7/12): K 4.1, creatinine 0.8, BNP 28, AST/ALT normal, LDL 89, HDL 62 Labs (9/12): K 4.6, creatinine 0.74 Labs (4/16): AST 66, ALT 47 Labs (6/16): K 4.1, creatinine 0.72, BNP 38, HCT 38.8  Allergies (verified):  No Known Drug Allergies  Past Medical History: 1. CAD: Inferior MI 12/11.  LHC with occluded mid CFX and 80% proximal RCA.  EF 55%.  He had 3.0 x 28 VIsion BMS to CFX.  Lexiscan myoview (7/12): EF 54%, moderate inferolateral scar with no ischemia. Unstable angina 4/16 with chronic total occlusion of the RCA, EF 45-50% by LV-gram.  Chronic angina, does not tolerate Imdur.  2. Diastolic CHF: Echo (25/95) with EF 50-55% and mild LVH.  EF 55% by LV-gram in 12/11. EF 45-50% by LV-gram in 4/16.  Echo (5/16) with EF 55-60%.   3. Hypertension 4. Type II diabetes 5. Cirrhosis: Suspect NASH.  Family and patient deny ETOH.  HCV, HBV, and HAV workup negative.  ANA negative.  AMA positive.  He had grade II esophageal varices on EGD with no evidence for bleeding in 12/11. Small varices seen again on EGD in 2015.  He had hepatic encephalopaty and ascites in 12/11.  He is now on rifaximin.  6. SVT (1/12): appeared to be an ectopic atrial tachycardia.  Required DCCV with hemodynamic instability.  7. GI bleed (12/11): Etiology not clearly defined.  EGD showed nonbleeding esophageal  varices.  8. Obesity  Family History: Adam Butler:Deceased age 42. Adam Butler age 78. Mother:Living. Adam Butler: 3 brothers & 1 sister living. 1 brother MI age 72 1 brother MI age 21  Social History: Unemployed, worked in [employer] prior.  Married  Tobacco Use - Former. Quit 2016.  Alcohol Use - no Regular Exercise - yes--walk  ROS: All systems reviewed and negative except as per HPI.    Current Outpatient Prescriptions  Medication Sig Dispense Refill  . aspirin 81 MG tablet Take 81 mg by mouth daily.     . enalapril (VASOTEC) 20 MG tablet Take 20 mg by mouth  daily.    . furosemide (LASIX) 40 MG tablet Take 40 mg by mouth daily.     . insulin NPH Human (HUMULIN N,NOVOLIN N) 100 UNIT/ML injection Inject 50-80 Units into the skin See admin instructions. Take 80 units in the morning and 50 units in the evening.    . lactulose (CHRONULAC) 10 GM/15ML solution Take 30 g by mouth 2 (two) times daily.    . metFORMIN (GLUCOPHAGE) 1000 MG tablet Take 1,000 mg by mouth 2 (two) times daily.  11  . Multiple Vitamin (MULTIVITAMIN) capsule Take 1 capsule by mouth daily.      . nadolol (CORGARD) 40 MG tablet Take 1 tablet (40 mg total) by mouth daily. 30 tablet 5  . nitroGLYCERIN (NITROSTAT) 0.4 MG SL tablet Place 1 tablet (0.4 mg total) under the tongue every 5 (five) minutes as needed. 30 tablet 6  . ranolazine (RANEXA) 500 MG 12 hr tablet Take 2 tablets (1,000 mg total) by mouth 2 (two) times daily. 60 tablet 11  . rifaximin (XIFAXAN) 550 MG TABS tablet Take 1 tablet (550 mg total) by mouth 2 (two) times daily. 60 tablet 0  . sulfamethoxazole-trimethoprim (BACTRIM DS,SEPTRA DS) 800-160 MG per tablet Take 1 tablet by mouth daily.    Marland Kitchen atorvastatin (LIPITOR) 10 MG tablet Take 1 tablet (10 mg total) by mouth daily. 30 tablet 6  . omeprazole (PRILOSEC) 20 MG capsule Take 1 capsule (20 mg total) by mouth daily. 30 capsule 6   No current facility-administered medications for this visit.    BP 100/50 mmHg  Pulse 60  Ht 5' 6.5" (1.689 m)  Wt 282 lb (127.914 kg)  BMI 44.84 kg/m2  SpO2 95% General:  Well developed, well nourished, in no acute distress.  Obese.  Neck:  Neck supple, no JVD. No masses, thyromegaly or abnormal cervical nodes. Lungs:  Distant breath sounds bilaterally.  Heart:  Non-displaced PMI, chest non-tender; regular rate and rhythm, S1, S2 without murmurs, rubs or gallops. Carotid upstroke normal, no bruit.  Pedals normal pulses. Trace ankle edema. Abdomen:  Bowel sounds positive; abdomen soft and non-tender without masses, organomegaly, or hernias  noted. No hepatosplenomegaly. Extremities:  No clubbing or cyanosis. Neurologic:  Alert and oriented x 3. Psych:  Normal affect.  Assessment/Plan: 1. CAD: Known CTO of RCA.  He does have chronic stable angina, improved since starting ranolazine.  No worrisome episodes.  He would not be a good candidate for CTO opening given higher bleeding risk in setting of cirrhosis with portal hypertension.  - Continue ASA 81 - Increase ranolazine to 1000 mg bid.  2. NASH: Mild elevation in transaminases.  Small varices by EGD in 2015.  He is on nadolol.  3. Hyperlipidemia: Given CAD, would like to have him on statin.  Think we can safely start low dose as long as LFTs are followed carefully.  - Atorvastatin  10 mg daily with LFT check in 1 month then lipids/LFTs in 2 months.  4. Chronic diastolic CHF: EF 84-16% on 5/16 echo.  He appears euvolemic.  Continue Lasix 40 mg daily.  5. ?GERD: Chest pain in bed at night may be GERD.  Will have him try over the counter omeprazole.  Loralie Champagne 01/16/2015

## 2015-01-19 ENCOUNTER — Encounter (HOSPITAL_COMMUNITY): Payer: Self-pay

## 2015-01-19 ENCOUNTER — Emergency Department (HOSPITAL_COMMUNITY): Payer: PPO

## 2015-01-19 ENCOUNTER — Observation Stay (HOSPITAL_COMMUNITY)
Admission: EM | Admit: 2015-01-19 | Discharge: 2015-01-20 | Disposition: A | Discharge status: Home / Self Care | Payer: PPO | Attending: Internal Medicine | Admitting: Internal Medicine

## 2015-01-19 ENCOUNTER — Telehealth: Payer: Self-pay | Admitting: Cardiology

## 2015-01-19 DIAGNOSIS — Z794 Long term (current) use of insulin: Secondary | ICD-10-CM | POA: Insufficient documentation

## 2015-01-19 DIAGNOSIS — R4182 Altered mental status, unspecified: Principal | ICD-10-CM | POA: Diagnosis present

## 2015-01-19 DIAGNOSIS — I2510 Atherosclerotic heart disease of native coronary artery without angina pectoris: Secondary | ICD-10-CM | POA: Insufficient documentation

## 2015-01-19 DIAGNOSIS — F1020 Alcohol dependence, uncomplicated: Secondary | ICD-10-CM | POA: Diagnosis not present

## 2015-01-19 DIAGNOSIS — Z7982 Long term (current) use of aspirin: Secondary | ICD-10-CM | POA: Insufficient documentation

## 2015-01-19 DIAGNOSIS — I10 Essential (primary) hypertension: Secondary | ICD-10-CM | POA: Diagnosis not present

## 2015-01-19 DIAGNOSIS — I5032 Chronic diastolic (congestive) heart failure: Secondary | ICD-10-CM | POA: Diagnosis not present

## 2015-01-19 DIAGNOSIS — G9340 Encephalopathy, unspecified: Secondary | ICD-10-CM | POA: Diagnosis not present

## 2015-01-19 DIAGNOSIS — Z955 Presence of coronary angioplasty implant and graft: Secondary | ICD-10-CM | POA: Insufficient documentation

## 2015-01-19 DIAGNOSIS — K7290 Hepatic failure, unspecified without coma: Secondary | ICD-10-CM | POA: Insufficient documentation

## 2015-01-19 DIAGNOSIS — K7469 Other cirrhosis of liver: Secondary | ICD-10-CM | POA: Insufficient documentation

## 2015-01-19 DIAGNOSIS — F17210 Nicotine dependence, cigarettes, uncomplicated: Secondary | ICD-10-CM | POA: Diagnosis not present

## 2015-01-19 DIAGNOSIS — I252 Old myocardial infarction: Secondary | ICD-10-CM | POA: Insufficient documentation

## 2015-01-19 DIAGNOSIS — IMO0002 Reserved for concepts with insufficient information to code with codable children: Secondary | ICD-10-CM | POA: Diagnosis present

## 2015-01-19 DIAGNOSIS — Z9114 Patient's other noncompliance with medication regimen: Secondary | ICD-10-CM | POA: Insufficient documentation

## 2015-01-19 DIAGNOSIS — E785 Hyperlipidemia, unspecified: Secondary | ICD-10-CM | POA: Diagnosis not present

## 2015-01-19 DIAGNOSIS — Z79899 Other long term (current) drug therapy: Secondary | ICD-10-CM | POA: Insufficient documentation

## 2015-01-19 DIAGNOSIS — Z86718 Personal history of other venous thrombosis and embolism: Secondary | ICD-10-CM | POA: Diagnosis not present

## 2015-01-19 DIAGNOSIS — E119 Type 2 diabetes mellitus without complications: Secondary | ICD-10-CM | POA: Diagnosis not present

## 2015-01-19 DIAGNOSIS — K7682 Hepatic encephalopathy: Secondary | ICD-10-CM | POA: Diagnosis present

## 2015-01-19 DIAGNOSIS — D696 Thrombocytopenia, unspecified: Secondary | ICD-10-CM | POA: Diagnosis present

## 2015-01-19 DIAGNOSIS — E1165 Type 2 diabetes mellitus with hyperglycemia: Secondary | ICD-10-CM

## 2015-01-19 LAB — PROTIME-INR
INR: 1.31 (ref 0.00–1.49)
PROTHROMBIN TIME: 16.4 s — AB (ref 11.6–15.2)

## 2015-01-19 LAB — COMPREHENSIVE METABOLIC PANEL
ALT: 50 U/L (ref 17–63)
AST: 68 U/L — ABNORMAL HIGH (ref 15–41)
Albumin: 2.9 g/dL — ABNORMAL LOW (ref 3.5–5.0)
Alkaline Phosphatase: 117 U/L (ref 38–126)
Anion gap: 7 (ref 5–15)
BILIRUBIN TOTAL: 1.7 mg/dL — AB (ref 0.3–1.2)
BUN: 9 mg/dL (ref 6–20)
CALCIUM: 9.7 mg/dL (ref 8.9–10.3)
CO2: 24 mmol/L (ref 22–32)
Chloride: 108 mmol/L (ref 101–111)
Creatinine, Ser: 0.89 mg/dL (ref 0.61–1.24)
GFR calc Af Amer: >60 mL/min (ref >60)
GFR calc non Af Amer: >60 mL/min (ref >60)
Glucose, Bld: 195 mg/dL — ABNORMAL HIGH (ref 65–99)
Potassium: 4 mmol/L (ref 3.5–5.1)
SODIUM: 139 mmol/L (ref 135–145)
Total Protein: 6.9 g/dL (ref 6.5–8.1)

## 2015-01-19 LAB — URINALYSIS, ROUTINE W REFLEX MICROSCOPIC
BILIRUBIN URINE: NEGATIVE
Glucose, UA: 250 mg/dL — AB
Hgb urine dipstick: NEGATIVE
KETONES UR: NEGATIVE mg/dL
Leukocytes, UA: NEGATIVE
Nitrite: NEGATIVE
Protein, ur: NEGATIVE mg/dL
Specific Gravity, Urine: 1.013 (ref 1.005–1.030)
Urobilinogen, UA: 2 mg/dL — ABNORMAL HIGH (ref 0.0–1.0)
pH: 6.5 (ref 5.0–8.0)

## 2015-01-19 LAB — GLUCOSE, CAPILLARY
GLUCOSE-CAPILLARY: 162 mg/dL — AB (ref 65–99)
Glucose-Capillary: 176 mg/dL — ABNORMAL HIGH (ref 65–99)

## 2015-01-19 LAB — CBC
HCT: 42 % (ref 39.0–52.0)
Hemoglobin: 14.6 g/dL (ref 13.0–17.0)
MCH: 34.4 pg — ABNORMAL HIGH (ref 26.0–34.0)
MCHC: 34.8 g/dL (ref 30.0–36.0)
MCV: 99.1 fL (ref 78.0–100.0)
PLATELETS: 57 10*3/uL — AB (ref 150–400)
RBC: 4.24 MIL/uL (ref 4.22–5.81)
RDW: 14.2 % (ref 11.5–15.5)
WBC: 5.3 10*3/uL (ref 4.0–10.5)

## 2015-01-19 LAB — AMMONIA: Ammonia: 98 umol/L — ABNORMAL HIGH (ref 9–35)

## 2015-01-19 LAB — APTT: aPTT: 33 seconds (ref 24–37)

## 2015-01-19 LAB — RAPID URINE DRUG SCREEN, HOSP PERFORMED
AMPHETAMINES: NOT DETECTED
BENZODIAZEPINES: NOT DETECTED
Barbiturates: NOT DETECTED
Cocaine: NOT DETECTED
OPIATES: NOT DETECTED
TETRAHYDROCANNABINOL: NOT DETECTED

## 2015-01-19 LAB — ETHANOL: Alcohol, Ethyl (B): <5 mg/dL (ref <5)

## 2015-01-19 LAB — I-STAT TROPONIN, ED: TROPONIN I, POC: 0 ng/mL (ref 0.00–0.08)

## 2015-01-19 LAB — I-STAT CG4 LACTIC ACID, ED: Lactic Acid, Venous: 1.87 mmol/L (ref 0.5–2.0)

## 2015-01-19 MED ORDER — ASPIRIN 81 MG PO TABS: 81.0000 mg | ORAL_TABLET | Freq: Every day | ORAL | Status: DC

## 2015-01-19 MED ORDER — SODIUM CHLORIDE 0.9 % IV SOLN
1000.0000 mL | INTRAVENOUS | Status: DC
  Administered 2015-01-19: 1000 mL via INTRAVENOUS

## 2015-01-19 MED ORDER — NADOLOL 40 MG PO TABS
40.0000 mg | ORAL_TABLET | Freq: Every day | ORAL | Status: DC
  Filled 2015-01-19 (×3): qty 1

## 2015-01-19 MED ORDER — RANOLAZINE ER 500 MG PO TB12
1000.0000 mg | ORAL_TABLET | Freq: Two times a day (BID) | ORAL | Status: DC
  Administered 2015-01-19 – 2015-01-20 (×3): 1000 mg via ORAL
  Filled 2015-01-19 (×5): qty 2

## 2015-01-19 MED ORDER — INSULIN ASPART 100 UNIT/ML ~~LOC~~ SOLN: 0.0000 [IU] | Freq: Every day | SUBCUTANEOUS | Status: DC

## 2015-01-19 MED ORDER — LACTULOSE 10 GM/15ML PO SOLN
30.0000 g | Freq: Two times a day (BID) | ORAL | Status: DC
  Administered 2015-01-19 – 2015-01-20 (×2): 30 g via ORAL
  Filled 2015-01-19 (×3): qty 45

## 2015-01-19 MED ORDER — SODIUM CHLORIDE 0.9 % IV SOLN: 250.0000 mL | INTRAVENOUS | Status: DC | PRN

## 2015-01-19 MED ORDER — ATORVASTATIN CALCIUM 10 MG PO TABS
10.0000 mg | ORAL_TABLET | Freq: Every day | ORAL | Status: DC
  Administered 2015-01-19 – 2015-01-20 (×2): 10 mg via ORAL
  Filled 2015-01-19 (×3): qty 1

## 2015-01-19 MED ORDER — ADULT MULTIVITAMIN W/MINERALS CH
1.0000 | ORAL_TABLET | Freq: Every day | ORAL | Status: DC
  Administered 2015-01-19 – 2015-01-20 (×2): 1 via ORAL
  Filled 2015-01-19 (×3): qty 1

## 2015-01-19 MED ORDER — SODIUM CHLORIDE 0.9 % IV SOLN
1000.0000 mL | Freq: Once | INTRAVENOUS | Status: AC
  Administered 2015-01-19: 1000 mL via INTRAVENOUS

## 2015-01-19 MED ORDER — INSULIN ASPART 100 UNIT/ML ~~LOC~~ SOLN
0.0000 [IU] | Freq: Three times a day (TID) | SUBCUTANEOUS | Status: DC
  Administered 2015-01-19: 3 [IU] via SUBCUTANEOUS
  Administered 2015-01-20: 2 [IU] via SUBCUTANEOUS
  Administered 2015-01-20: 5 [IU] via SUBCUTANEOUS

## 2015-01-19 MED ORDER — SODIUM CHLORIDE 0.9 % IJ SOLN
3.0000 mL | Freq: Two times a day (BID) | INTRAMUSCULAR | Status: DC
  Administered 2015-01-19 – 2015-01-20 (×2): 3 mL via INTRAVENOUS

## 2015-01-19 MED ORDER — LACTULOSE 10 GM/15ML PO SOLN
20.0000 g | Freq: Once | ORAL | Status: AC
  Administered 2015-01-19: 20 g via ORAL
  Filled 2015-01-19: qty 30

## 2015-01-19 MED ORDER — INSULIN NPH (HUMAN) (ISOPHANE) 100 UNIT/ML ~~LOC~~ SUSP
55.0000 [IU] | Freq: Every day | SUBCUTANEOUS | Status: DC
  Administered 2015-01-20: 55 [IU] via SUBCUTANEOUS

## 2015-01-19 MED ORDER — SODIUM CHLORIDE 0.9 % IJ SOLN: 3.0000 mL | INTRAMUSCULAR | Status: DC | PRN

## 2015-01-19 MED ORDER — MULTIVITAMINS PO CAPS: 1.0000 | ORAL_CAPSULE | Freq: Every day | ORAL | Status: DC

## 2015-01-19 MED ORDER — INSULIN NPH (HUMAN) (ISOPHANE) 100 UNIT/ML ~~LOC~~ SUSP
35.0000 [IU] | Freq: Every day | SUBCUTANEOUS | Status: DC
  Administered 2015-01-19: 35 [IU] via SUBCUTANEOUS
  Filled 2015-01-19: qty 10

## 2015-01-19 MED ORDER — ASPIRIN EC 81 MG PO TBEC
81.0000 mg | DELAYED_RELEASE_TABLET | Freq: Every day | ORAL | Status: DC
  Administered 2015-01-19 – 2015-01-20 (×2): 81 mg via ORAL
  Filled 2015-01-19 (×3): qty 1

## 2015-01-19 MED ORDER — PANTOPRAZOLE SODIUM 40 MG PO TBEC
40.0000 mg | DELAYED_RELEASE_TABLET | Freq: Every day | ORAL | Status: DC
  Administered 2015-01-19 – 2015-01-20 (×2): 40 mg via ORAL
  Filled 2015-01-19 (×2): qty 1

## 2015-01-19 MED ORDER — OXYCODONE HCL 5 MG PO TABS: 5.0000 mg | ORAL_TABLET | Freq: Four times a day (QID) | ORAL | Status: DC | PRN

## 2015-01-19 NOTE — H&P (Signed)
Date: 01/19/2015               Patient Name:  Adam Butler MRN: [id number]  DOB: [birth year] Age / Sex: 59 y.o., male   PCP: Antonietta Jewel, MD         Medical Service: Internal Medicine Teaching Service         Attending Physician: Dr. Aldine Contes, MD    First Contact: Dr. Marlowe Sax Pager: [phone]  Second Contact: Dr. Ronnald Ramp Pager: [phone]       After Hours (After 5p/  First Contact Pager: [phone]  weekends / holidays): Second Contact Pager: [phone]   Chief Complaint:confusion  History of Present Illness: Patient is a 59 yo M with a PMHx of alcoholism, cryptogenic cirrhosis, HLD, CAD s/p stent, CHF, HTN, DM2, DVT, GI bleed from esophageal varices, MI, hepatic encephalopathy, thrombocytopenia, cholelithiasis, and rectal varices presenting with a 2 day history of confusion which worsened today. Pt wife reports he has hepatic encephalopathy for which he is supposed to take lactulose twice a day but she has noticed him taking the medication only once a day, did not take it last night. Reports the pt does not take Rifaximin because it is too expensive. Wife states the patient has been eating well and had a pizza for dinner last night. However, he did take Zantac yesterday for heartburn. She states the pt has been confused - unable to distinguish between forks and knifes in the kitchen drawer. In addition, he urinated all over the toilet this morning. Pt denies using any new medications. Denies fever, chills, CP, SOB, abdominal pain, nausea, vomiting, diarrhea, or constipation. Denies any dysuria, urinary frequency or urgency.   Note from gastroenterologist Dr. Hilarie Fredrickson from 01/01/15 states patient is supposed to take Rifaximin 577m BID in place of lactulose given diarrhea. If he cannot afford Rifaximin, switch back to lactulose.   Meds: Current Facility-Administered Medications  Medication Dose Route Frequency Provider Last Rate Last Dose  . 0.9 %  sodium chloride infusion  250 mL  Intravenous PRN ELucious Groves DO      . aspirin EC tablet 81 mg  81 mg Oral Daily JPat Patrick RPH   81 mg at 01/19/15 1611  . atorvastatin (LIPITOR) tablet 10 mg  10 mg Oral Daily ELucious Groves DO   10 mg at 01/19/15 1612  . insulin aspart (novoLOG) injection 0-15 Units  0-15 Units Subcutaneous TID WC ELucious Groves DO      . insulin aspart (novoLOG) injection 0-5 Units  0-5 Units Subcutaneous QHS ELucious Groves DO      . insulin NPH Human (HUMULIN N,NOVOLIN N) injection 35 Units  35 Units Subcutaneous QHS ELucious Groves DO      . [START ON 01/20/2015] insulin NPH Human (HUMULIN N,NOVOLIN N) injection 55 Units  55 Units Subcutaneous QAC breakfast ELucious Groves DO      . lactulose (CHRONULAC) 10 GM/15ML solution 30 g  30 g Oral BID ELucious Groves DO      . multivitamin with minerals tablet 1 tablet  1 tablet Oral Daily Nischal Narendra, MD   1 tablet at 01/19/15 1612  . nadolol (CORGARD) tablet 40 mg  40 mg Oral Daily ELucious Groves DO   Stopped at 01/19/15 1700  . oxyCODONE (Oxy IR/ROXICODONE) immediate release tablet 5 mg  5 mg Oral Q6H PRN ELucious Groves DO      . pantoprazole (PROTONIX) EC tablet 40 mg  40  mg Oral Daily Lucious Groves, DO   40 mg at 01/19/15 1611  . ranolazine (RANEXA) 12 hr tablet 1,000 mg  1,000 mg Oral BID Lucious Groves, DO   1,000 mg at 01/19/15 1611  . sodium chloride 0.9 % injection 3 mL  3 mL Intravenous Q12H Lucious Groves, DO   3 mL at 01/19/15 1545  . sodium chloride 0.9 % injection 3 mL  3 mL Intravenous PRN Lucious Groves, DO        Allergies: Allergies as of 01/19/2015  . (No Known Allergies)   Past Medical History  Diagnosis Date  . Cirrhosis 2011    Cryptogenic, Likely NASH. Family/pt deny EtOH. HCV, HBV, HAV negative. ANA negative. AMA positive. Ascites 12/11  . Hyperlipemia   . Coronary artery disease     Inferior MI 12/11; LHC with occluded mid CFX and 80% proximal RCA. EF 55%. He had 3.0 x 28 vision BMS to CFX  . Diastolic CHF,  acute     Echo 12/11 with ef 50-55% and mild LVH. EF 55% by LV0gram in 12/11  . Hypertension   . Type II diabetes mellitus 2011  . SVT (supraventricular tachycardia)     1/12: appeared to be an ectopic atrial tachycardia. Required DCCV with hemodynamic instability  . GI bleed     12/11: Etiology not clearly defined. EGD: nonbleeding esophageal varices.  . S/P coronary artery stent placement 06/2010  . Esophageal varices 2011, 2013    no hx acute variceal bleed  . Hepatic encephalopathy 2011, 12/2013  . Myocardial infarction ? 2012  . Shortness of breath dyspnea   . Arthritis   . Hx of echocardiogram     Echo 5/16:  EF 55-60%, no RWMA, mod LAE  . Thrombocytopenia   . Cholelithiasis   . Rectal varices    Past Surgical History  Procedure Laterality Date  . Cardiac catheterization  07/01/2010    BMS to CFX.  Marland Kitchen Appendectomy    . Orif r leg    . Esophagogastroduodenoscopy  04/13/2012    Procedure: ESOPHAGOGASTRODUODENOSCOPY (EGD);  Surgeon: Inda Castle, MD;  Location: [facility];  Service: Endoscopy;  Laterality: N/A;  . Colonoscopy  04/13/2012    Procedure: COLONOSCOPY;  Surgeon: Inda Castle, MD;  Location: [facility] ENDOSCOPY;  Service: Endoscopy;  Laterality: N/A;  . Esophagogastroduodenoscopy N/A 01/01/2014    Procedure: ESOPHAGOGASTRODUODENOSCOPY (EGD);  Surgeon: Jerene Bears, MD;  Location: [facility] ENDOSCOPY;  Service: Endoscopy;  Laterality: N/A;  . Esophageal banding N/A 01/01/2014    Procedure: ESOPHAGEAL BANDING;  Surgeon: Jerene Bears, MD;  Location: [facility] ENDOSCOPY;  Service: Endoscopy;  Laterality: N/A;  . Coronary stent placement  06/30/2010    CFX   Distal        . Left heart catheterization with coronary angiogram N/A 10/15/2014    Procedure: LEFT HEART CATHETERIZATION WITH CORONARY ANGIOGRAM;  Surgeon: Peter M Martinique, MD;  Location: [facility] CATH LAB;  Service: Cardiovascular;  Laterality: N/A;   Family History  Problem Relation Age of Onset  . Heart attack Brother 100    MI  .  Heart attack Brother 56    MI  . Heart attack Father   . Diabetes Father   . Diabetes Brother   . COPD Mother   . Stroke Neg Hx    History   Social History  . Marital Status: Married    Spouse Name: N/A  . Number of Children: 3  . Years of  Education: N/A   Occupational History  . Unemployed Other    Worked in maintenance prior   Social History Main Topics  . Smoking status: Current Every Day Smoker -- 1.00 packs/day for 36 years    Types: Cigarettes  . Smokeless tobacco: Current User     Comment: uses vapor cigarettes (2016 )  . Alcohol Use: No  . Drug Use: No  . Sexual Activity: Not on file   Other Topics Concern  . Not on file   Social History Narrative   Married   Gets regular exercise: walking    Review of Systems: Negative except above. ROS difficult to obtain because patient was confused.   Physical Exam: Blood pressure 121/49, pulse 49, temperature 97.9 F (36.6 C), temperature source Oral, resp. rate 18, height 5' 6.5" (1.689 m), weight 283 lb 4.7 oz (128.5 kg), SpO2 98 %.  Constitutional: Vital signs reviewed.  Patient is a well-developed and well-nourished. Pt is in no acute distress and cooperative with exam.  Head: Normocephalic and atraumatic Nose: No erythema or drainage noted Mouth: moist mucous membranes  Eyes: PERRL, EOMI, conjunctivae normal  Neck: Supple, Trachea midline normal ROM Cardiovascular: RRR, S1 normal, S2 normal, no MRG, pulses symmetric and intact bilaterally Pulmonary/Chest: normal respiratory effort, CTAB, no wheezes, rales, or rhonchi Abdominal: Soft. Non-tender, non-distended, bowel sounds are normal, no masses, organomegaly, or guarding present.  GU: no CVA tenderness Musculoskeletal: No joint deformities, erythema, or stiffness, ROM full and no nontender Neurological: A&O x2 (does not know today's date or what year this is), Strength is normal and symmetric bilaterally, cranial nerve II-XII are grossly intact, no focal motor  deficit, sensory intact to light touch bilaterally.  Skin: Warm, dry and intact   Lab results: Basic Metabolic Panel:  Recent Labs  01/19/15 1323  NA 139  K 4.0  CL 108  CO2 24  GLUCOSE 195*  BUN 9  CREATININE 0.89  CALCIUM 9.7   Liver Function Tests:  Recent Labs  01/19/15 1323  AST 68*  ALT 50  ALKPHOS 117  BILITOT 1.7*  PROT 6.9  ALBUMIN 2.9*    Recent Labs  01/19/15 1323  AMMONIA 98*   CBC:  Recent Labs  01/19/15 1323  WBC 5.3  HGB 14.6  HCT 42.0  MCV 99.1  PLT 57*   Coagulation:  Recent Labs  01/19/15 1323  LABPROT 16.4*  INR 1.31   Urine Drug Screen: Drugs of Abuse     Component Value Date/Time   LABOPIA NONE DETECTED 01/19/2015 1318   COCAINSCRNUR NONE DETECTED 01/19/2015 1318   LABBENZ NONE DETECTED 01/19/2015 1318   AMPHETMU NONE DETECTED 01/19/2015 1318   THCU NONE DETECTED 01/19/2015 1318   LABBARB NONE DETECTED 01/19/2015 1318    Alcohol Level:  Recent Labs  01/19/15 1323  ETH <5   Urinalysis:  Recent Labs  01/19/15 1318  COLORURINE YELLOW  LABSPEC 1.013  PHURINE 6.5  GLUCOSEU 250*  HGBUR NEGATIVE  BILIRUBINUR NEGATIVE  KETONESUR NEGATIVE  PROTEINUR NEGATIVE  UROBILINOGEN 2.0*  NITRITE NEGATIVE  LEUKOCYTESUR NEGATIVE   Imaging results:  Dg Chest 2 View  01/19/2015   CLINICAL DATA:  Altered mental status.  EXAM: CHEST  2 VIEW  COMPARISON:  10/14/2014  FINDINGS: The heart size and pulmonary vascularity are normal. No infiltrates or effusions. Markings are slightly accentuated due to a shallow inspiration. No acute osseous abnormality.  IMPRESSION: No acute abnormalities.   Electronically Signed   By: Lorriane Shire M.D.  On: 01/19/2015 13:40   Ct Head Wo Contrast  01/19/2015   CLINICAL DATA:  Confused  EXAM: CT HEAD WITHOUT CONTRAST  TECHNIQUE: Contiguous axial images were obtained from the base of the skull through the vertex without intravenous contrast.  COMPARISON:  12/29/2013  FINDINGS: There is no evidence  of mass effect, midline shift, or extra-axial fluid collections. There is no evidence of a space-occupying lesion or intracranial hemorrhage. There is no evidence of a cortical-based area of acute infarction. There is generalized cerebral atrophy. There is periventricular white matter low attenuation likely secondary to microangiopathy.  The ventricles and sulci are appropriate for the patient's age. The basal cisterns are patent.  Visualized portions of the orbits are unremarkable. The visualized portions of the paranasal sinuses and mastoid air cells are unremarkable. Cerebrovascular atherosclerotic calcifications are noted.  The osseous structures are unremarkable.  IMPRESSION: 1. No acute intracranial pathology. 2. Chronic microvascular disease and cerebral atrophy.   Electronically Signed   By: Kathreen Devoid   On: 01/19/2015 13:30    Other results: EKG: T wave inversions in leads II, III, aVF suggesting an old inferior wall infarct. No significant change from previous EKG (10/15/14).   Assessment & Plan by Problem: Principal Problem:   Encephalopathy, hepatic Active Problems:   HLD (hyperlipidemia)   DIASTOLIC HEART FAILURE, CHRONIC   Hepatic cirrhosis   Hepatic encephalopathy   Type 2 diabetes mellitus, uncontrolled   Altered mental state  Confusion likely 2/2 hepatic encephalopathy  Pt is AAOx3, oriented to person and place but not to time. He has cryptogenic hepatic encephalopathy for which he should be taking lactulose BID, however, pts wife confirmed that he is only taking the medication once a day. In addition, he did not take the medicine last night. Confusion most likely 2/2 buildup of ammonia, 98 upon admission. Head CT does not show any acute findings. It suggests chronic microvascular disease and cerebral atrophy.  -Given Lactulose 20g initially. Continue giving lactulose 30g BID.  -Neuro checks q4 -PT eval  Thrombocytopenia Platelets 57. Most likely 2/2 liver cirrhosis. Pt does  not have any signs of abnormal or excessive bleeding.  -F/u AM CBC  HLD -cont home med Lipitor   dCHF -cont home med nadolol  CAD -cont ASA and Ranolazine  HTN -Held home meds Enalapril and Lasix at the time because pts BP was soft, 104/43.  DMII -reduced home regiment to Novolin 55 units qam and 35 units qhs -SSI  GI ppx: Protonix  DVT ppx: SCDs  Diet: carb modified  Dispo: Disposition is deferred at this time, awaiting improvement of current medical problems. Anticipated discharge in approximately 1-2 day(s).   The patient does have a current PCP (Sami Sheryle Hail, MD) and does not need an [facility] hospital follow-up appointment after discharge.  The patient does not have transportation limitations that hinder transportation to clinic appointments.  Signed: Shela Leff, MD 01/19/2015, 5:08 PM

## 2015-01-19 NOTE — Progress Notes (Signed)
Report called to ED

## 2015-01-19 NOTE — ED Notes (Signed)
PA Camprubi-Soms at bedside.

## 2015-01-19 NOTE — ED Notes (Signed)
Pt. Presents with complaint of altered mental status. Pt. Alert to person, place, situation. Unable to state what year it is correctly, but does know month. Pt. Wife stating he has been slightly altered x 2 days. Pt. With hx of cirrhosis and encephalopathy. Pt. Taking lactulose.

## 2015-01-19 NOTE — Telephone Encounter (Signed)
LMTCB--I do not find DPR on file for this pt.

## 2015-01-19 NOTE — ED Notes (Signed)
Wife at bedside states that patient has been nauseated since Saturday and woke up confused this AM.  Wife states that patient woke up this am around 09:00 this morning.

## 2015-01-19 NOTE — ED Provider Notes (Signed)
CSN: [id number]     Arrival date & time 01/19/15  1151 History   First MD Initiated Contact with Patient 01/19/15 1218     Chief Complaint  Patient presents with  . Altered Mental Status     (Consider location/radiation/quality/duration/timing/severity/associated sxs/prior Treatment) HPI Comments: Adam Butler is a 59 y.o. male with a PMHx of alcoholism, cirrhosis, HLD, CAD s/p stent, CHF, HTN, DM2, DVT, GI bleed from esophageal varices, MI, hepatic encephalopathy, thrombocytopenia, cholelithiasis, and rectal varices, who presents to the ED via EMS with his wife accompanying him, who reports 2 days of confusion which worsened today. LEVEL 5 CAVEAT DUE TO ALTERED MENTAL STATUS. She states that the pt complained of nausea yesterday and took Zantac, and that today he seemed more altered than his normal self. She reports that he may not have taken his medicines this morning, and that he stopped taking rifaximin due to financial difficulties with affording this medication. The patient is able to [facility] where he has although he is unable to tell me what city and state he is in, he is unsure of the month or the year, but he is able to deny any fevers, chills, chest pain, shortness of breath, abdominal pain, nausea, vomiting, diarrhea, constipation, dysuria, hematuria, cough, numbness, tingling, or weakness.  Patient is a 59 y.o. male presenting with altered mental status. The history is provided by the patient, the spouse and the EMS personnel. The history is limited by the condition of the patient. No language interpreter was used.  Altered Mental Status Presenting symptoms: confusion and disorientation   Severity:  Moderate Most recent episode:  2 days ago Episode history:  Continuous Duration:  2 days Timing:  Constant Progression:  Worsening Chronicity:  Recurrent Context: not taking medications as prescribed     Past Medical History  Diagnosis Date  . Cirrhosis 2011    Cryptogenic,  Likely NASH. Family/pt deny EtOH. HCV, HBV, HAV negative. ANA negative. AMA positive. Ascites 12/11  . Hyperlipemia   . Coronary artery disease     Inferior MI 12/11; LHC with occluded mid CFX and 80% proximal RCA. EF 55%. He had 3.0 x 28 vision BMS to CFX  . Diastolic CHF, acute     Echo 12/11 with ef 50-55% and mild LVH. EF 55% by LV0gram in 12/11  . Hypertension   . Type II diabetes mellitus 2011  . SVT (supraventricular tachycardia)     1/12: appeared to be an ectopic atrial tachycardia. Required DCCV with hemodynamic instability  . GI bleed     12/11: Etiology not clearly defined. EGD: nonbleeding esophageal varices.  . S/P coronary artery stent placement 06/2010  . Esophageal varices 2011, 2013    no hx acute variceal bleed  . Hepatic encephalopathy 2011, 12/2013  . Myocardial infarction ? 2012  . Shortness of breath dyspnea   . Arthritis   . Hx of echocardiogram     Echo 5/16:  EF 55-60%, no RWMA, mod LAE  . Thrombocytopenia   . Cholelithiasis   . Rectal varices    Past Surgical History  Procedure Laterality Date  . Cardiac catheterization  07/01/2010    BMS to CFX.  Marland Kitchen Appendectomy    . Orif r leg    . Esophagogastroduodenoscopy  04/13/2012    Procedure: ESOPHAGOGASTRODUODENOSCOPY (EGD);  Surgeon: Inda Castle, MD;  Location: [facility];  Service: Endoscopy;  Laterality: N/A;  . Colonoscopy  04/13/2012    Procedure: COLONOSCOPY;  Surgeon: Inda Castle, MD;  Location: [facility] ENDOSCOPY;  Service: Endoscopy;  Laterality: N/A;  . Esophagogastroduodenoscopy N/A 01/01/2014    Procedure: ESOPHAGOGASTRODUODENOSCOPY (EGD);  Surgeon: Jerene Bears, MD;  Location: [facility] ENDOSCOPY;  Service: Endoscopy;  Laterality: N/A;  . Esophageal banding N/A 01/01/2014    Procedure: ESOPHAGEAL BANDING;  Surgeon: Jerene Bears, MD;  Location: [facility] ENDOSCOPY;  Service: Endoscopy;  Laterality: N/A;  . Coronary stent placement  06/30/2010    CFX   Distal        . Left heart catheterization with coronary  angiogram N/A 10/15/2014    Procedure: LEFT HEART CATHETERIZATION WITH CORONARY ANGIOGRAM;  Surgeon: Peter M Martinique, MD;  Location: [facility] CATH LAB;  Service: Cardiovascular;  Laterality: N/A;   Family History  Problem Relation Age of Onset  . Heart attack Brother 6    MI  . Heart attack Brother 34    MI  . Heart attack Father   . Diabetes Father   . Diabetes Brother   . COPD Mother   . Stroke Neg Hx    History  Substance Use Topics  . Smoking status: Current Every Day Smoker -- 1.00 packs/day for 36 years    Types: Cigarettes  . Smokeless tobacco: Current User     Comment: uses vapor cigarettes (2016 )  . Alcohol Use: No    Review of Systems  Unable to perform ROS: Mental status change  Psychiatric/Behavioral: Positive for confusion.     Allergies  Review of patient's allergies indicates no known allergies.  Home Medications   Prior to Admission medications   Medication Sig Start Date End Date Taking? Authorizing Provider  aspirin 81 MG tablet Take 81 mg by mouth daily.     Historical Provider, MD  atorvastatin (LIPITOR) 10 MG tablet Take 1 tablet (10 mg total) by mouth daily. 01/15/15   Larey Dresser, MD  enalapril (VASOTEC) 20 MG tablet Take 20 mg by mouth daily.    Historical Provider, MD  furosemide (LASIX) 40 MG tablet Take 40 mg by mouth daily.  01/09/12   Larey Dresser, MD  insulin NPH Human (HUMULIN N,NOVOLIN N) 100 UNIT/ML injection Inject 50-80 Units into the skin See admin instructions. Take 80 units in the morning and 50 units in the evening.    Historical Provider, MD  lactulose (CHRONULAC) 10 GM/15ML solution Take 30 g by mouth 2 (two) times daily.    Historical Provider, MD  metFORMIN (GLUCOPHAGE) 1000 MG tablet Take 1,000 mg by mouth 2 (two) times daily. 11/27/14   Historical Provider, MD  Multiple Vitamin (MULTIVITAMIN) capsule Take 1 capsule by mouth daily.      Historical Provider, MD  nadolol (CORGARD) 40 MG tablet Take 1 tablet (40 mg total) by mouth  daily. 01/01/15   Jerene Bears, MD  nitroGLYCERIN (NITROSTAT) 0.4 MG SL tablet Place 1 tablet (0.4 mg total) under the tongue every 5 (five) minutes as needed. 01/09/12   Larey Dresser, MD  omeprazole (PRILOSEC) 20 MG capsule Take 1 capsule (20 mg total) by mouth daily. 01/15/15   Larey Dresser, MD  ranolazine (RANEXA) 500 MG 12 hr tablet Take 2 tablets (1,000 mg total) by mouth 2 (two) times daily. 01/15/15   Larey Dresser, MD  rifaximin (XIFAXAN) 550 MG TABS tablet Take 1 tablet (550 mg total) by mouth 2 (two) times daily. 01/01/15   Jerene Bears, MD  sulfamethoxazole-trimethoprim (BACTRIM DS,SEPTRA DS) 800-160 MG per tablet Take 1 tablet by mouth daily.  Historical Provider, MD   BP 110/45 mmHg  Pulse 56  Temp(Src) 97.5 F (36.4 C) (Oral)  Resp 22  SpO2 97% Physical Exam  Constitutional: Vital signs are normal. He appears well-developed and well-nourished.  Non-toxic appearance. No distress.  Afebrile, nontoxic, NAD  HENT:  Head: Normocephalic and atraumatic.  Mouth/Throat: Oropharynx is clear and moist and mucous membranes are normal.  Eyes: Conjunctivae and EOM are normal. Pupils are equal, round, and reactive to light. Right eye exhibits no discharge. Left eye exhibits no discharge.  PERRL, EOMI, no nystagmus, no visual field deficits   Neck: Normal range of motion. Neck supple.  Cardiovascular: Regular rhythm, normal heart sounds and intact distal pulses.  Bradycardia present.  Exam reveals no gallop and no friction rub.   No murmur heard. Sinus brady with reg rhythm, nl s1/s2, no m/r/g, distal pulses intact, no pedal edema   Pulmonary/Chest: Effort normal and breath sounds normal. No respiratory distress. He has no decreased breath sounds. He has no wheezes. He has no rhonchi. He has no rales.  CTAB in all lung fields, no w/r/r, no hypoxia or increased WOB, speaking in full sentences, SpO2 97% on RA   Abdominal: Soft. Normal appearance and bowel sounds are normal. He exhibits no  distension. There is no tenderness. There is no rigidity, no rebound, no guarding, no CVA tenderness, no tenderness at McBurney's point and negative Murphy's sign.  Musculoskeletal: Normal range of motion.  MAE x4 Strength and sensation grossly intact Distal pulses intact  Neurological: He is alert. He has normal strength. No cranial nerve deficit or sensory deficit. Coordination normal. GCS eye subscore is 4. GCS verbal subscore is 4. GCS motor subscore is 6.  CN 2-12 grossly intact A&O x3 to person, place (hospital, but not city/state), and situation but disoriented to time GCS 14 due to disoriented response to time Sensation and strength intact Coordination WNL + L arm pronator drift  Skin: Skin is warm, dry and intact. No rash noted.  Psychiatric: He has a normal mood and affect. Cognition and memory are impaired.  Nursing note and vitals reviewed.   ED Course  Procedures (including critical care time) Labs Review Labs Reviewed  COMPREHENSIVE METABOLIC PANEL - Abnormal; Notable for the following:    Glucose, Bld 195 (*)    Albumin 2.9 (*)    AST 68 (*)    Total Bilirubin 1.7 (*)    All other components within normal limits  CBC - Abnormal; Notable for the following:    MCH 34.4 (*)    Platelets 57 (*)    All other components within normal limits  AMMONIA - Abnormal; Notable for the following:    Ammonia 98 (*)    All other components within normal limits  PROTIME-INR - Abnormal; Notable for the following:    Prothrombin Time 16.4 (*)    All other components within normal limits  URINALYSIS, ROUTINE W REFLEX MICROSCOPIC (NOT AT [facility]) - Abnormal; Notable for the following:    Glucose, UA 250 (*)    Urobilinogen, UA 2.0 (*)    All other components within normal limits  APTT  ETHANOL  URINE RAPID DRUG SCREEN, HOSP PERFORMED  I-STAT TROPOININ, ED  I-STAT CG4 LACTIC ACID, ED    Imaging Review Dg Chest 2 View  01/19/2015   CLINICAL DATA:  Altered mental status.  EXAM:  CHEST  2 VIEW  COMPARISON:  10/14/2014  FINDINGS: The heart size and pulmonary vascularity are normal. No infiltrates or effusions.  Markings are slightly accentuated due to a shallow inspiration. No acute osseous abnormality.  IMPRESSION: No acute abnormalities.   Electronically Signed   By: Lorriane Shire M.D.   On: 01/19/2015 13:40   Ct Head Wo Contrast  01/19/2015   CLINICAL DATA:  Confused  EXAM: CT HEAD WITHOUT CONTRAST  TECHNIQUE: Contiguous axial images were obtained from the base of the skull through the vertex without intravenous contrast.  COMPARISON:  12/29/2013  FINDINGS: There is no evidence of mass effect, midline shift, or extra-axial fluid collections. There is no evidence of a space-occupying lesion or intracranial hemorrhage. There is no evidence of a cortical-based area of acute infarction. There is generalized cerebral atrophy. There is periventricular white matter low attenuation likely secondary to microangiopathy.  The ventricles and sulci are appropriate for the patient's age. The basal cisterns are patent.  Visualized portions of the orbits are unremarkable. The visualized portions of the paranasal sinuses and mastoid air cells are unremarkable. Cerebrovascular atherosclerotic calcifications are noted.  The osseous structures are unremarkable.  IMPRESSION: 1. No acute intracranial pathology. 2. Chronic microvascular disease and cerebral atrophy.   Electronically Signed   By: Kathreen Devoid   On: 01/19/2015 13:30     EKG Interpretation   Date/Time:  Monday January 19 2015 14:05:34 EDT Ventricular Rate:  50 PR Interval:  149 QRS Duration: 98 QT Interval:  487 QTC Calculation: 444 R Axis:   31 Text Interpretation:  Sinus rhythm Inferior infarct, old since last  tracing no significant change Confirmed by BELFI  MD, MELANIE ([id number]) on  01/19/2015 2:14:04 PM      MDM   Final diagnoses:  Altered mental status, unspecified altered mental status type  Hepatic encephalopathy   Thrombocytopenia  Hyperglycemia due to type 2 diabetes mellitus    59 y.o. male here with altered mental status. Wife states that he's been more confused the last two days but worse today. Isn't taking xifaxan, but taking lactulose although not sure if he took it today. Oriented to person and place, although can't say what city/state it is, but disoriented to time/month. On neuro exam, slight pronator drift in L arm. Will get labs, ammonia, trop, PTT/INR, U/A, EKG, CXR, UDS, Ethanol level, lactic acid, and U/A. Will get CXR and CT head. Will reassess shortly.   2:12 PM Lactic WNL. Trop neg. CMP with gluc 195, AST 68 which is baseline, Bili 1.7 which is baseline. CBC WNL (Plt pending). APTT WNL. Ammonia level 98, will give lactulose. EtOH WNL. UDS neg. INR WNL. U/A with glucose but otherwise WNL. CXR and CT head unremarkable. EKG unchanged. Will admit for AMS likely secondary to hepatic encephalopathy.  2:18 PM Dr. Heber Hollenberg of IM teaching service calling back, will admit pt. Please see his notes for further documentation of care.  BP 114/63 mmHg  Pulse 51  Temp(Src) 97.5 F (36.4 C) (Oral)  Resp 14  SpO2 96%  Meds ordered this encounter  Medications  . 0.9 %  sodium chloride infusion    Sig:    Followed by  . 0.9 %  sodium chloride infusion    Sig:   . lactulose (CHRONULAC) 10 GM/15ML solution 20 g    Sig:      Lucien Budney Camprubi-Soms, PA-C 01/19/15 1419  Malvin Johns, MD 01/19/15 1530

## 2015-01-19 NOTE — Telephone Encounter (Signed)
New message     Pt family called stating pt is being taken to the hospital.  Family is concerned about medication that pt is on, did not have exact name of medication.  EMT stated that this medication that pt is on is for swelling of the brain and family is very concerned about this medication.  Family did not have name of medication, because EMT took medication with the patient.   Please call to advise

## 2015-01-20 DIAGNOSIS — K7290 Hepatic failure, unspecified without coma: Secondary | ICD-10-CM

## 2015-01-20 DIAGNOSIS — R4182 Altered mental status, unspecified: Secondary | ICD-10-CM | POA: Diagnosis present

## 2015-01-20 LAB — BASIC METABOLIC PANEL
ANION GAP: 9 (ref 5–15)
BUN: 8 mg/dL (ref 6–20)
CHLORIDE: 105 mmol/L (ref 101–111)
CO2: 22 mmol/L (ref 22–32)
CREATININE: 0.85 mg/dL (ref 0.61–1.24)
Calcium: 9.1 mg/dL (ref 8.9–10.3)
GFR calc Af Amer: >60 mL/min (ref >60)
Glucose, Bld: 117 mg/dL — ABNORMAL HIGH (ref 65–99)
Potassium: 3.7 mmol/L (ref 3.5–5.1)
Sodium: 136 mmol/L (ref 135–145)

## 2015-01-20 LAB — GLUCOSE, CAPILLARY
GLUCOSE-CAPILLARY: 202 mg/dL — AB (ref 65–99)
Glucose-Capillary: 136 mg/dL — ABNORMAL HIGH (ref 65–99)

## 2015-01-20 MED ORDER — LACTULOSE 10 GM/15ML PO SOLN: 30.0000 g | Freq: Two times a day (BID) | ORAL | Status: DC

## 2015-01-20 NOTE — Telephone Encounter (Signed)
Pt currently an inpatient

## 2015-01-20 NOTE — Care Management Note (Signed)
Case Management Note  Patient Details  Name: MATHIUS BIRKELAND MRN: [id number] Date of Birth: December 31, 1955  Subjective/Objective:   Patient for dc today, per physical therapy no pt f/u needed.                   Action/Plan:   Expected Discharge Date:                  Expected Discharge Plan:  Home/Self Care  In-House Referral:     Discharge planning Services  CM Consult  Post Acute Care Choice:    Choice offered to:     DME Arranged:    DME Agency:     HH Arranged:    [place] Agency:     Status of Service:  Completed, signed off  Medicare Important Message Given:    Date Medicare IM Given:    Medicare IM give by:    Date Additional Medicare IM Given:    Additional Medicare Important Message give by:     If discussed at [place] of Stay Meetings, dates discussed:    Additional Comments:  Zenon Mayo, RN 01/20/2015, 1:59 PM

## 2015-01-20 NOTE — Discharge Instructions (Signed)
1. You have a follow up appointment as follows:  Adam Butler, Adam Butler  On 01/27/2015 9-11 AM  [address] [place] [address] [phone]  2. Please take all medications as previously prescribed with the following changes: Please take Lactulose 30 g twice daily; Increase to three or four times daily to assure at least 2-3 bowel movements daily. Can decrease the frequency if having more than 4 bowel movements daily.    3. If you have worsening of your symptoms or new symptoms arise, please call the clinic ([phone]), or go to the ER immediately if symptoms are severe.   Hepatic Encephalopathy Hepatic encephalopathy is a syndrome. This is a set of symptoms that occur together. It is seen mostly in patients with damage to the liver known as cirrhosis. This is where normal liver tissue has been replaced by scar tissue.  Symptoms of the syndrome include:  Changes in personality.  Mental impairment.  A depressed level of consciousness. These changes occur because toxins build up in the bloodstream. The build up occurs because the scarred liver cannot rid toxins from the body. The most important of these toxins is ammonia. Toxins can cause abnormal behavior and confusion. Toxins in the blood stream can impair your ability to take care of yourself or others. Some people become very sleepy and cannot be woken easily. In severe cases, the patient lapses into a coma.  CAUSES  There are many things that can cause liver damage that can lead to buildup of toxins. These include:  Diseases that cause cirrhosis of the liver.  Long-term alcohol use with progressive liver damage.  Hepatitis B or C with ongoing infection and liver damage.  Patients without cirrhosis who have undergone shunt surgery.  Kidney failure.  Bleeding in the stomach or intestines.  Infection.  Constipation.  Medications that act upon the central nervous system.  Diuretic therapy.  Excessive dietary  protein. SYMPTOMS  Symptoms of this syndrome are categorized or "staged" based on severity.   Stage 0. Minimal hepatic encephalopathy. No detectable changes in personality or behavior. Minimal changes in memory, concentration, mental function, and physical ability.  Stage 1. Some lack of awareness. Shortened attention span. Problems with addition or subtraction. Possible problems with sleeping or a reversal of the normal sleep pattern. Euphoria, depression, or irritability may be present. Mild confusion. Slowing of mental ability. Tremors may be detected.  Stage 2. Lethargy or apathy. Disoriented. Strange behavior. Slurred speech. Obvious tremors. Drowsiness, unable to perform mental tasks. Personality changes, and confusion about time.  Stage 3. Very sleepy but can be aroused. Unable to perform mental tasks, cannot keep track of time and place, marked confusion, amnesia, occasional fits of rage, speech cannot be understood.  Stage 4. Coma with or without response to painful stimuli. DIAGNOSIS  In mild cases, a careful history and physical exam may lead your caregiver to consider possible mild hepatic encephalopathy as the cause of symptoms. The diagnosis is clearer in more severe cases. An elevated blood ammonia level is the classic blood test abnormality in patients with this syndrome. Other tests can be helpful to rule out other diseases.  TREATMENT   Medications are often used to lower the ammonia level in the blood. This usually leads to improvement.  Diets containing vegetable proteins are better than diets rich in animal protein, especially proteins derived from red meats. Eating well-cooked chicken and fish in addition to vegetable protein should be discussed with your caregiver. Malnourished patients are encouraged to add  liquid nutritional supplements to their diet.  Antibiotics are sometimes used to try to lessen the volume of bacteria in the intestines that produce  ammonia.  Moderate to severe cases of this syndrome usually require a hospital stay and medicine that is given directly into a vein (intravenously). HOME CARE INSTRUCTIONS  The goal at home is to avoid things that can make the condition worse and lead to a buildup of ammonia in the blood.  Eat a well balanced diet. Your caregiver can help you with suggestions on this.  Talk to your caregiver before taking vitamin supplements. Large doses of vitamins and minerals, especially vitamin A, iron, or copper, can worsen liver damage.  A low salt diet, water restriction, or diuretic medicine may be needed to reduce fluid retention.  Avoid alcohol and acetaminophen as well as any over-the-counter medications that contain acetaminophen (check labels). Only take over-the-counter or prescription medicines for pain, discomfort, or fever as directed by your caregiver.  Avoid drugs that are toxic to the liver. Review your medications (both prescription and non-prescription) with your caregiver to make sure those you are taking will not be harmful.  Blood tests may be needed. Follow your caregiver's advice regarding the timing of these.  With this condition you play a critical role in maintaining your own good health. The failure to follow your caregiver's advice and these instructions may result in permanent disability or death. SEEK MEDICAL CARE IF:   You have increasing fatigue or weakness.  You develop increasing swelling of the abdomen, hands, feet, legs or face.  You develop loss of appetite.  You are feeling sick to your stomach (nausea) and vomiting.  You develop jaundice. This is a yellow discoloration of the skin.  You develop worsening problems with concentration, confusion, and/or problems with sleep. SEEK IMMEDIATE MEDICAL CARE IF:   You vomit bright red blood or a coffee ground-looking material.  You have blood in your stools. Or the stools turn black and tarry.  You have a  fever.  You develop easy bruising or bleeding.  You have a return of slurred speech, change in behavior, or confusion. MAKE SURE YOU:   Understand these instructions.  Will watch your condition.  Will get help right away if you are not doing well or get worse. Document Released: 09/06/2006 Document Revised: 09/19/2011 Document Reviewed: 06/13/2007 [facility] Patient Information 2015 [place], [place]. This information is not intended to replace advice given to you by your health care provider. Make sure you discuss any questions you have with your health care provider.

## 2015-01-20 NOTE — Discharge Summary (Signed)
Name: Adam Butler MRN: [id number] DOB: 02-12-1956 59 y.o. PCP: Antonietta Jewel, MD  Date of Admission: 01/19/2015 11:51 AM Date of Discharge: 01/21/2015 Attending Physician: No att. providers found  Discharge Diagnosis:  Principal Problem:   Encephalopathy, hepatic Active Problems:   HLD (hyperlipidemia)   DIASTOLIC HEART FAILURE, CHRONIC   Hepatic cirrhosis   Hepatic encephalopathy   Type 2 diabetes mellitus, uncontrolled   Altered mental state   Altered mental status  Discharge Medications:   Medication List    STOP taking these medications        lactulose (encephalopathy) 10 GM/15ML Soln  Commonly known as:  CHRONULAC     rifaximin 550 MG Tabs tablet  Commonly known as:  XIFAXAN      TAKE these medications        aspirin 81 MG tablet  Take 81 mg by mouth daily.     atorvastatin 10 MG tablet  Commonly known as:  LIPITOR  Take 1 tablet (10 mg total) by mouth daily.     enalapril 20 MG tablet  Commonly known as:  VASOTEC  Take 20 mg by mouth daily.     furosemide 40 MG tablet  Commonly known as:  LASIX  Take 40 mg by mouth daily.     insulin NPH Human 100 UNIT/ML injection  Commonly known as:  HUMULIN N,NOVOLIN N  Inject 50-80 Units into the skin See admin instructions. Take 80 units in the morning and 50 units in the evening.     isosorbide mononitrate 30 MG 24 hr tablet  Commonly known as:  IMDUR  Take 15 mg by mouth daily.     lactulose 10 GM/15ML solution  Commonly known as:  CHRONULAC  Take 45 mLs (30 g total) by mouth 2 (two) times daily. Titrate to 2-4 bowel movements daily     metFORMIN 1000 MG tablet  Commonly known as:  GLUCOPHAGE  Take 1,000 mg by mouth 2 (two) times daily.     multivitamin capsule  Take 1 capsule by mouth daily.     nadolol 40 MG tablet  Commonly known as:  CORGARD  Take 1 tablet (40 mg total) by mouth daily.     nitroGLYCERIN 0.4 MG SL tablet  Commonly known as:  NITROSTAT  Place 1 tablet (0.4 mg total)  under the tongue every 5 (five) minutes as needed.     omeprazole 20 MG capsule  Commonly known as:  PRILOSEC  Take 1 capsule (20 mg total) by mouth daily.     ranolazine 500 MG 12 hr tablet  Commonly known as:  RANEXA  Take 2 tablets (1,000 mg total) by mouth 2 (two) times daily.     sulfamethoxazole-trimethoprim 800-160 MG per tablet  Commonly known as:  BACTRIM DS,SEPTRA DS  Take 1 tablet by mouth daily.        Disposition and follow-up:   [name] was discharged from [facility] in Stable condition.  At the hospital follow up visit please address:  1.  Confusion 2/2 hepatic encephalopathy Has the patient had any more episodes of confusion? Is he titrating Lactulose based on the number of BMs per day?   2.  Labs / imaging needed at time of follow-up: CBC (f/u low platelets)  3.  Pending labs/ test needing follow-up: None   Follow-up Appointments: Follow-up Information    Follow up with [facility], MD On 01/27/2015.   Specialty:  Internal Medicine   Why:  9-11 AM  Contact information:   [address] [place] [address] [phone]       Follow up with Antonietta Jewel, MD.   Specialty:  Internal Medicine   Contact information:   [address], [address] [place] [address] [phone]       Consultations:    Physical Therapy Evaluation   Procedures Performed:  Dg Chest 2 View  01/19/2015   CLINICAL DATA:  Altered mental status.  EXAM: CHEST  2 VIEW  COMPARISON:  10/14/2014  FINDINGS: The heart size and pulmonary vascularity are normal. No infiltrates or effusions. Markings are slightly accentuated due to a shallow inspiration. No acute osseous abnormality.  IMPRESSION: No acute abnormalities.   Electronically Signed   By: Lorriane Shire M.D.   On: 01/19/2015 13:40   Ct Head Wo Contrast  01/19/2015   CLINICAL DATA:  Confused  EXAM: CT HEAD WITHOUT CONTRAST  TECHNIQUE: Contiguous axial images were obtained from the base of the  skull through the vertex without intravenous contrast.  COMPARISON:  12/29/2013  FINDINGS: There is no evidence of mass effect, midline shift, or extra-axial fluid collections. There is no evidence of a space-occupying lesion or intracranial hemorrhage. There is no evidence of a cortical-based area of acute infarction. There is generalized cerebral atrophy. There is periventricular white matter low attenuation likely secondary to microangiopathy.  The ventricles and sulci are appropriate for the patient's age. The basal cisterns are patent.  Visualized portions of the orbits are unremarkable. The visualized portions of the paranasal sinuses and mastoid air cells are unremarkable. Cerebrovascular atherosclerotic calcifications are noted.  The osseous structures are unremarkable.  IMPRESSION: 1. No acute intracranial pathology. 2. Chronic microvascular disease and cerebral atrophy.   Electronically Signed   By: Kathreen Devoid   On: 01/19/2015 13:30    Admission HPI: Patient is a 59 yo M with a PMHx of alcoholism, cryptogenic cirrhosis, HLD, CAD s/p stent, CHF, HTN, DM2, DVT, GI bleed from esophageal varices, MI, hepatic encephalopathy, thrombocytopenia, cholelithiasis, and rectal varices presenting with a 2 day history of confusion which worsened today. Pt wife reports he has hepatic encephalopathy for which he is supposed to take lactulose twice a day but she has noticed him taking the medication only once a day, did not take it last night. Reports the pt does not take Rifaximin because it is too expensive. Wife states the patient has been eating well and had a pizza for dinner last night. However, he did take Zantac yesterday for heartburn. She states the pt has been confused - unable to distinguish between forks and knifes in the kitchen drawer. In addition, he urinated all over the toilet this morning. Pt denies using any new medications. Denies fever, chills, CP, SOB, abdominal pain, nausea, vomiting, diarrhea,  or constipation. Denies any dysuria, urinary frequency or urgency.   Note from gastroenterologist Dr. Hilarie Fredrickson from 01/01/15 states patient is supposed to take Rifaximin 57m BID in place of lactulose given diarrhea. If he cannot afford Rifaximin, switch back to lactulose.   Hospital Course by problem list: Principal Problem:   Encephalopathy, hepatic Active Problems:   HLD (hyperlipidemia)   DIASTOLIC HEART FAILURE, CHRONIC   Hepatic cirrhosis   Hepatic encephalopathy   Type 2 diabetes mellitus, uncontrolled   Altered mental state   Altered mental status   1. Confusion  Confusion likely 2/2 hepatic encephalopathy resulting from non-compliance to his Lactulose and stopping his Rifaximin. Pt AAOx2 and ammonia 98 upon admission. Head CT did not  show any acute findings. Glucose was not low upon admission. Pt was supposed to take Lactulose BID but only took it QD. In addition, he did not take lactulose the day prior to his admission. He was given Lactulose 20g initially and then continued on Lactulose 30g BID. Pt was AAOx3 on 7/12 and did not appear to be confused anymore. Discussed with the pt how he can titrate the dose based on the number of bowel movements he is having per day. Pt to f/u with PCP and Gastroenterologist as outpatient.   2. Thrombocytopenia - stable Platelets 57. Most likely 2/2 liver cirrhosis. Pt did not have any signs of abnormal or excessive bleeding.   3. HLD - stable  -cont home med Lipitor   4. dCHF - stable -cont home med nadolol  5. CAD - stable -cont ASA and Ranolazine  6. HTN - stable Held home meds Enalapril and Lasix at the time because pts BP was soft, 104/43 upon admission. BP was 136/61 on 7/12 and meds restarted upon discharge.   7. DMII - stable Reduced home regiment to Novolin 55 units qam and 35 units qhs +ssi. Pt to resume his home regimen upon discharge.   Discharge Vitals:   BP 136/61 mmHg  Pulse 61  Temp(Src) 98 F (36.7 C) (Oral)  Resp  18  Ht 5' 6.5" (1.689 m)  Wt 283 lb 4.7 oz (128.5 kg)  BMI 45.04 kg/m2  SpO2 99%  Discharge Labs:  Results for orders placed or performed during the hospital encounter of 01/19/15 (from the past 24 hour(s))  Glucose, capillary     Status: Abnormal   Collection Time: 01/20/15 11:47 AM  Result Value Ref Range   Glucose-Capillary 202 (H) 65 - 99 mg/dL    Signed: Shela Leff, MD 01/21/2015, 10:59 AM    Services Ordered on Discharge: None Equipment Ordered on Discharge: None

## 2015-01-20 NOTE — Progress Notes (Signed)
Utilization review completed.  

## 2015-01-20 NOTE — Progress Notes (Signed)
[name] to be D/C'd Home per MD order.  Discussed with the patient and all questions fully answered.  VSS, Skin clean, dry and intact without evidence of skin break down, no evidence of skin tears noted. IV catheter discontinued intact. Site without signs and symptoms of complications. Dressing and pressure applied.  An After Visit Summary was printed and given to the patient. Patient received prescription.  D/c education completed with patient/family including follow up instructions, medication list, d/c activities limitations if indicated, with other d/c instructions as indicated by MD - patient able to verbalize understanding, all questions fully answered.   Patient instructed to return to ED, call 911, or call MD for any changes in condition.   Patient escorted via [place], and D/C home via private auto.  Adam Butler F 01/20/2015 4:00 PM

## 2015-01-20 NOTE — Evaluation (Addendum)
Physical Therapy Evaluation/Discharge Patient Details Name: ACESON LABELL MRN: [id number] DOB: 01-Jun-1956 Today's Date: 01/20/2015   History of Present Illness  Pt adm with hepatic encephalopathy. PMHx of alcoholism, cryptogenic cirrhosis, HLD, CAD s/p stent, CHF, HTN, DM2, DVT, GI bleed, hepatic encephalopathy  Clinical Impression  Pt doing well with mobility and no further PT needed.  Ready for dc from PT standpoint.      Follow Up Recommendations No PT follow up    Equipment Recommendations  None recommended by PT    Recommendations for Other Services       Precautions / Restrictions Precautions Precautions: None Restrictions Weight Bearing Restrictions: No      Mobility  Bed Mobility Overal bed mobility: Independent                Transfers Overall transfer level: Independent                  Ambulation/Gait Ambulation/Gait assistance: Independent Ambulation Distance (Feet): 500 Feet Assistive device: None Gait Pattern/deviations: WFL(Within Functional Limits)   Gait velocity interpretation: at or above normal speed for age/gender General Gait Details: Pt with steady gait. Performs head turns, speed changes, quick stops, and quick turns without difficulty.  Stairs            Wheelchair Mobility    Modified Rankin (Stroke Patients Only)       Balance Overall balance assessment: Independent                                           Pertinent Vitals/Pain Pain Assessment: No/denies pain    Home Living Family/patient expects to be discharged to:: Private residence Living Arrangements: Spouse/significant other;Other relatives Available Help at Discharge: Family;Available PRN/intermittently Type of Home: House Home Access: Stairs to enter Entrance Stairs-Rails: Right;Left;Can reach both Entrance Stairs-Number of Steps: 3 Home Layout: One level Home Equipment: Walker - 2 wheels;Cane - single point       Prior Function Level of Independence: Independent with assistive device(s)         Comments: Uses cane at times if walking long distance.     Hand Dominance   Dominant Hand: Right    Extremity/Trunk Assessment   Upper Extremity Assessment: Overall WFL for tasks assessed           Lower Extremity Assessment: Overall WFL for tasks assessed         Communication   Communication: No difficulties  Cognition Arousal/Alertness: Awake/alert Behavior During Therapy: WFL for tasks assessed/performed Overall Cognitive Status: Within Functional Limits for tasks assessed                      General Comments      Exercises        Assessment/Plan    PT Assessment Patent does not need any further PT services  PT Diagnosis Difficulty walking   PT Problem List    PT Treatment Interventions     PT Goals (Current goals can be found in the Care Plan section) Acute Rehab PT Goals PT Goal Formulation: All assessment and education complete, DC therapy    Frequency     Barriers to discharge        Co-evaluation               End of Session   Activity Tolerance: Patient tolerated treatment well Patient left:  Other (comment) (in bathroom) Nurse Communication: Mobility status         Time: 3200-3794 PT Time Calculation (min) (ACUTE ONLY): 18 min   Charges:   PT Evaluation $Initial PT Evaluation Tier I: 1 Procedure     PT G Codes:        Forever Arechiga 01/31/15, 10:50 AM  Jael Kostick PT [phone]

## 2015-01-20 NOTE — Progress Notes (Signed)
Subjective: Patient was seen and examined at bedside today. He was AAOx3 and appeared to be in no acute distress. Denied any headaches, seizures, weakness, numbness, or tingling. Denied fever, chills, CP, or SOB. No other complaints.   Objective: Vital signs in last 24 hours: Filed Vitals:   01/19/15 2126 01/20/15 0505 01/20/15 0800 01/20/15 1344  BP: 118/57 117/56 121/67 136/61  Pulse: 51 53 57 61  Temp: 98.1 F (36.7 C) 98 F (36.7 C) 97.9 F (36.6 C) 98 F (36.7 C)  TempSrc: Oral  Oral Oral  Resp: 18 18 18    Height:      Weight:      SpO2: 100% 97% 100% 99%   Weight change:   Intake/Output Summary (Last 24 hours) at 01/20/15 1636 Last data filed at 01/20/15 1300  Gross per 24 hour  Intake    583 ml  Output      0 ml  Net    583 ml   Physical Exam  Constitutional: Vital signs reviewed. Patient is a well-developed and well-nourished. Pt is in no acute distress and cooperative with exam.  Head: Normocephalic and atraumatic Nose: No erythema or drainage noted Mouth: moist mucous membranes  Eyes: PERRL, EOMI, conjunctivae normal  Neck: Supple, Trachea midline normal ROM Cardiovascular: RRR, S1 normal, S2 normal, no MRG, pulses symmetric and intact bilaterally Pulmonary/Chest: normal respiratory effort, CTAB, no wheezes, rales, or rhonchi Abdominal: obese, soft. Non-tender, non-distended, bowel sounds are normal, no masses, organomegaly, or guarding present.  Musculoskeletal: No joint deformities, erythema, or stiffness, ROM full and no nontender Neurological: AAOx3. Strength is normal and symmetric bilaterally, cranial nerve II-XII are grossly intact, no focal motor deficit, sensory intact to light touch bilaterally.  Skin: Warm, dry and intact   Lab Results: Basic Metabolic Panel:  Recent Labs Lab 01/19/15 1323 01/20/15 0535  NA 139 136  K 4.0 3.7  CL 108 105  CO2 24 22  GLUCOSE 195* 117*  BUN 9 8  CREATININE 0.89 0.85  CALCIUM 9.7 9.1   Liver Function  Tests:  Recent Labs Lab 01/19/15 1323  AST 68*  ALT 50  ALKPHOS 117  BILITOT 1.7*  PROT 6.9  ALBUMIN 2.9*    Recent Labs Lab 01/19/15 1323  AMMONIA 98*   CBC:  Recent Labs Lab 01/19/15 1323  WBC 5.3  HGB 14.6  HCT 42.0  MCV 99.1  PLT 57*   CBG:  Recent Labs Lab 01/19/15 1704 01/19/15 2124 01/20/15 0804 01/20/15 1147  GLUCAP 176* 162* 136* 202*   Coagulation:  Recent Labs Lab 01/19/15 1323  LABPROT 16.4*  INR 1.31   Urine Drug Screen: Drugs of Abuse     Component Value Date/Time   LABOPIA NONE DETECTED 01/19/2015 1318   COCAINSCRNUR NONE DETECTED 01/19/2015 1318   LABBENZ NONE DETECTED 01/19/2015 1318   AMPHETMU NONE DETECTED 01/19/2015 1318   THCU NONE DETECTED 01/19/2015 1318   LABBARB NONE DETECTED 01/19/2015 1318    Alcohol Level:  Recent Labs Lab 01/19/15 1323  ETH <5   Urinalysis:  Recent Labs Lab 01/19/15 1318  COLORURINE YELLOW  LABSPEC 1.013  PHURINE 6.5  GLUCOSEU 250*  HGBUR NEGATIVE  BILIRUBINUR NEGATIVE  KETONESUR NEGATIVE  PROTEINUR NEGATIVE  UROBILINOGEN 2.0*  NITRITE NEGATIVE  LEUKOCYTESUR NEGATIVE    Micro Results: No results found for this or any previous visit (from the past 240 hour(s)). Studies/Results: Dg Chest 2 View  01/19/2015   CLINICAL DATA:  Altered mental status.  EXAM: CHEST  2 VIEW  COMPARISON:  10/14/2014  FINDINGS: The heart size and pulmonary vascularity are normal. No infiltrates or effusions. Markings are slightly accentuated due to a shallow inspiration. No acute osseous abnormality.  IMPRESSION: No acute abnormalities.   Electronically Signed   By: Lorriane Shire M.D.   On: 01/19/2015 13:40   Ct Head Wo Contrast  01/19/2015   CLINICAL DATA:  Confused  EXAM: CT HEAD WITHOUT CONTRAST  TECHNIQUE: Contiguous axial images were obtained from the base of the skull through the vertex without intravenous contrast.  COMPARISON:  12/29/2013  FINDINGS: There is no evidence of mass effect, midline shift,  or extra-axial fluid collections. There is no evidence of a space-occupying lesion or intracranial hemorrhage. There is no evidence of a cortical-based area of acute infarction. There is generalized cerebral atrophy. There is periventricular white matter low attenuation likely secondary to microangiopathy.  The ventricles and sulci are appropriate for the patient's age. The basal cisterns are patent.  Visualized portions of the orbits are unremarkable. The visualized portions of the paranasal sinuses and mastoid air cells are unremarkable. Cerebrovascular atherosclerotic calcifications are noted.  The osseous structures are unremarkable.  IMPRESSION: 1. No acute intracranial pathology. 2. Chronic microvascular disease and cerebral atrophy.   Electronically Signed   By: Kathreen Devoid   On: 01/19/2015 13:30   Medications: I have reviewed the patient's current medications. Scheduled Meds: . aspirin EC  81 mg Oral Daily  . atorvastatin  10 mg Oral Daily  . insulin aspart  0-15 Units Subcutaneous TID WC  . insulin aspart  0-5 Units Subcutaneous QHS  . insulin NPH Human  35 Units Subcutaneous QHS  . insulin NPH Human  55 Units Subcutaneous QAC breakfast  . lactulose  30 g Oral BID  . multivitamin with minerals  1 tablet Oral Daily  . nadolol  40 mg Oral Daily  . pantoprazole  40 mg Oral Daily  . ranolazine  1,000 mg Oral BID  . sodium chloride  3 mL Intravenous Q12H   Continuous Infusions:  PRN Meds:.sodium chloride, oxyCODONE, sodium chloride Assessment/Plan: Principal Problem:   Encephalopathy, hepatic Active Problems:   HLD (hyperlipidemia)   DIASTOLIC HEART FAILURE, CHRONIC   Hepatic cirrhosis   Hepatic encephalopathy   Type 2 diabetes mellitus, uncontrolled   Altered mental state   Altered mental status  59 yo M with a PMHx of alcoholism, cryptogenic cirrhosis, HLD, CAD s/p stent, CHF, HTN, DM2, DVT, GI bleed from esophageal varices, MI, hepatic encephalopathy, thrombocytopenia,  cholelithiasis, and rectal varices admitted for confusion which resulted from patient not taking Lactulose as prescribed.   Confusion likely 2/2 hepatic encephalopathy  Confusion most likely 2/2 buildup of ammonia, 98 upon admission. Head CT does not show any acute findings. Glucose was not low upon admission. Pt was supposed to take Lactulose BID but only took it QD. In addition, he did not take lactulose the day prior to his admission. He was given Lactulose 20g initially. Pt is AAOx3 today and does not appear to be confused anymore. He will be discharged to home. - Continue giving Lactulose 30g BID. Discussed with the pt how he can titrate the dose based on the number of bowel movements he is having per day. -Neuro checks q4 -Evaluated by PT today  Thrombocytopenia - stable Platelets 57. Most likely 2/2 liver cirrhosis. Pt does not have any signs of abnormal or excessive bleeding.   HLD -cont home med Lipitor   dCHF  -cont home med nadolol  CAD -cont ASA and Ranolazine  HTN -Held home meds Enalapril and Lasix at the time because pts BP was soft, 104/43.  DMII -reduced home regiment to Novolin 55 units qam and 35 units qhs +ssi -pt will resume his home regimen upon discharge   GI ppx: Protonix  Diet: carb modified   DVT ppx: SCDs  Diet: carb modified  Dispo: Disposition is deferred at this time, awaiting improvement of current medical problems.  Anticipated discharge in approximately 0 day(s).   The patient does have a current PCP (Sami Sheryle Hail, MD) and does need an [facility] hospital follow-up appointment after discharge.  The patient does not have transportation limitations that hinder transportation to clinic appointments.  .Services Needed at time of discharge: Y = Yes, Blank = No PT:   OT:   RN:   Equipment:   Other:     LOS: 1 day   Shela Leff, MD 01/20/2015, 4:36 PM

## 2015-01-22 NOTE — Progress Notes (Signed)
PT Note Late G Code Entry    01/20/15 1054  PT G-Codes **NOT FOR INPATIENT CLASS**  Functional Assessment Tool Used clinical judgement  Functional Limitation Mobility: Walking and moving around  Mobility: Walking and Moving Around Current Status [phone]) [facility]  Mobility: Walking and Moving Around Goal Status [phone]) CH  Mobility: Walking and Moving Around Discharge Status [phone]) [place] PT [phone]

## 2015-01-31 ENCOUNTER — Observation Stay (HOSPITAL_COMMUNITY)
Admission: EM | Admit: 2015-01-31 | Discharge: 2015-02-01 | Disposition: A | Discharge status: Home / Self Care | Payer: PPO | Attending: Oncology | Admitting: Oncology

## 2015-01-31 ENCOUNTER — Observation Stay (HOSPITAL_COMMUNITY): Payer: PPO

## 2015-01-31 ENCOUNTER — Encounter (HOSPITAL_COMMUNITY): Payer: Self-pay | Admitting: General Practice

## 2015-01-31 ENCOUNTER — Emergency Department (HOSPITAL_COMMUNITY): Payer: PPO

## 2015-01-31 DIAGNOSIS — K766 Portal hypertension: Secondary | ICD-10-CM

## 2015-01-31 DIAGNOSIS — E119 Type 2 diabetes mellitus without complications: Secondary | ICD-10-CM | POA: Insufficient documentation

## 2015-01-31 DIAGNOSIS — Z7982 Long term (current) use of aspirin: Secondary | ICD-10-CM | POA: Insufficient documentation

## 2015-01-31 DIAGNOSIS — Z9119 Patient's noncompliance with other medical treatment and regimen: Secondary | ICD-10-CM | POA: Insufficient documentation

## 2015-01-31 DIAGNOSIS — E785 Hyperlipidemia, unspecified: Secondary | ICD-10-CM | POA: Diagnosis not present

## 2015-01-31 DIAGNOSIS — K7290 Hepatic failure, unspecified without coma: Principal | ICD-10-CM | POA: Insufficient documentation

## 2015-01-31 DIAGNOSIS — D732 Chronic congestive splenomegaly: Secondary | ICD-10-CM | POA: Insufficient documentation

## 2015-01-31 DIAGNOSIS — I10 Essential (primary) hypertension: Secondary | ICD-10-CM | POA: Insufficient documentation

## 2015-01-31 DIAGNOSIS — F17210 Nicotine dependence, cigarettes, uncomplicated: Secondary | ICD-10-CM | POA: Diagnosis not present

## 2015-01-31 DIAGNOSIS — K7460 Unspecified cirrhosis of liver: Secondary | ICD-10-CM | POA: Insufficient documentation

## 2015-01-31 DIAGNOSIS — I8500 Esophageal varices without bleeding: Secondary | ICD-10-CM | POA: Insufficient documentation

## 2015-01-31 DIAGNOSIS — I2510 Atherosclerotic heart disease of native coronary artery without angina pectoris: Secondary | ICD-10-CM | POA: Insufficient documentation

## 2015-01-31 DIAGNOSIS — D61818 Other pancytopenia: Secondary | ICD-10-CM | POA: Insufficient documentation

## 2015-01-31 DIAGNOSIS — K7682 Hepatic encephalopathy: Secondary | ICD-10-CM

## 2015-01-31 DIAGNOSIS — R1011 Right upper quadrant pain: Secondary | ICD-10-CM

## 2015-01-31 DIAGNOSIS — I252 Old myocardial infarction: Secondary | ICD-10-CM | POA: Diagnosis not present

## 2015-01-31 DIAGNOSIS — D696 Thrombocytopenia, unspecified: Secondary | ICD-10-CM | POA: Diagnosis not present

## 2015-01-31 DIAGNOSIS — K7469 Other cirrhosis of liver: Secondary | ICD-10-CM | POA: Insufficient documentation

## 2015-01-31 LAB — URINALYSIS, ROUTINE W REFLEX MICROSCOPIC
Bilirubin Urine: NEGATIVE
Glucose, UA: 100 mg/dL — AB
Hgb urine dipstick: NEGATIVE
KETONES UR: NEGATIVE mg/dL
Leukocytes, UA: NEGATIVE
NITRITE: NEGATIVE
Protein, ur: NEGATIVE mg/dL
SPECIFIC GRAVITY, URINE: 1.013 (ref 1.005–1.030)
UROBILINOGEN UA: 1 mg/dL (ref 0.0–1.0)
pH: 7 (ref 5.0–8.0)

## 2015-01-31 LAB — DIFFERENTIAL
BASOS ABS: 0 10*3/uL (ref 0.0–0.1)
Basophils Relative: 1 % (ref 0–1)
EOS ABS: 0.2 10*3/uL (ref 0.0–0.7)
Eosinophils Relative: 5 % (ref 0–5)
Lymphocytes Relative: 22 % (ref 12–46)
Lymphs Abs: 1 10*3/uL (ref 0.7–4.0)
MONO ABS: 0.5 10*3/uL (ref 0.1–1.0)
Monocytes Relative: 10 % (ref 3–12)
NEUTROS PCT: 62 % (ref 43–77)
Neutro Abs: 2.9 10*3/uL (ref 1.7–7.7)

## 2015-01-31 LAB — COMPREHENSIVE METABOLIC PANEL
ALT: 60 U/L (ref 17–63)
AST: 82 U/L — AB (ref 15–41)
Albumin: 2.9 g/dL — ABNORMAL LOW (ref 3.5–5.0)
Alkaline Phosphatase: 156 U/L — ABNORMAL HIGH (ref 38–126)
Anion gap: 8 (ref 5–15)
BUN: 6 mg/dL (ref 6–20)
CALCIUM: 9.1 mg/dL (ref 8.9–10.3)
CO2: 23 mmol/L (ref 22–32)
CREATININE: 0.86 mg/dL (ref 0.61–1.24)
Chloride: 106 mmol/L (ref 101–111)
GFR calc Af Amer: >60 mL/min (ref >60)
GFR calc non Af Amer: >60 mL/min (ref >60)
Glucose, Bld: 167 mg/dL — ABNORMAL HIGH (ref 65–99)
Potassium: 3.8 mmol/L (ref 3.5–5.1)
Sodium: 137 mmol/L (ref 135–145)
Total Bilirubin: 2 mg/dL — ABNORMAL HIGH (ref 0.3–1.2)
Total Protein: 6.8 g/dL (ref 6.5–8.1)

## 2015-01-31 LAB — CBC
HEMATOCRIT: 40.4 % (ref 39.0–52.0)
HEMOGLOBIN: 14.5 g/dL (ref 13.0–17.0)
MCH: 34.5 pg — AB (ref 26.0–34.0)
MCHC: 35.9 g/dL (ref 30.0–36.0)
MCV: 96.2 fL (ref 78.0–100.0)
Platelets: 51 10*3/uL — ABNORMAL LOW (ref 150–400)
RBC: 4.2 MIL/uL — AB (ref 4.22–5.81)
RDW: 13.7 % (ref 11.5–15.5)
WBC: 4.7 10*3/uL (ref 4.0–10.5)

## 2015-01-31 LAB — I-STAT TROPONIN, ED: Troponin i, poc: 0.01 ng/mL (ref 0.00–0.08)

## 2015-01-31 LAB — I-STAT CG4 LACTIC ACID, ED: LACTIC ACID, VENOUS: 2.26 mmol/L — AB (ref 0.5–2.0)

## 2015-01-31 LAB — CBG MONITORING, ED: GLUCOSE-CAPILLARY: 149 mg/dL — AB (ref 65–99)

## 2015-01-31 LAB — GLUCOSE, CAPILLARY
Glucose-Capillary: 198 mg/dL — ABNORMAL HIGH (ref 65–99)
Glucose-Capillary: 276 mg/dL — ABNORMAL HIGH (ref 65–99)

## 2015-01-31 LAB — AMMONIA: AMMONIA: 99 umol/L — AB (ref 9–35)

## 2015-01-31 MED ORDER — SODIUM CHLORIDE 0.9 % IV SOLN
1000.0000 mL | Freq: Once | INTRAVENOUS | Status: AC
  Administered 2015-01-31: 1000 mL via INTRAVENOUS

## 2015-01-31 MED ORDER — LACTULOSE 10 GM/15ML PO SOLN
30.0000 g | Freq: Once | ORAL | Status: AC
  Administered 2015-01-31: 30 g via ORAL
  Filled 2015-01-31 (×2): qty 45

## 2015-01-31 MED ORDER — INSULIN ASPART 100 UNIT/ML ~~LOC~~ SOLN
0.0000 [IU] | Freq: Every day | SUBCUTANEOUS | Status: DC
Start: 2015-01-31 — End: 2015-02-01
  Administered 2015-01-31: 3 [IU] via SUBCUTANEOUS

## 2015-01-31 MED ORDER — FUROSEMIDE 40 MG PO TABS
40.0000 mg | ORAL_TABLET | Freq: Every day | ORAL | Status: DC
  Administered 2015-02-01: 40 mg via ORAL
  Filled 2015-01-31: qty 1

## 2015-01-31 MED ORDER — RANOLAZINE ER 500 MG PO TB12
1000.0000 mg | ORAL_TABLET | Freq: Two times a day (BID) | ORAL | Status: DC
  Administered 2015-01-31 – 2015-02-01 (×2): 1000 mg via ORAL
  Filled 2015-01-31 (×3): qty 2

## 2015-01-31 MED ORDER — ISOSORBIDE MONONITRATE 15 MG HALF TABLET
15.0000 mg | ORAL_TABLET | Freq: Every day | ORAL | Status: DC
  Administered 2015-01-31 – 2015-02-01 (×2): 15 mg via ORAL
  Filled 2015-01-31 (×2): qty 1

## 2015-01-31 MED ORDER — SODIUM CHLORIDE 0.9 % IV BOLUS (SEPSIS)
1000.0000 mL | Freq: Once | INTRAVENOUS | Status: AC
  Administered 2015-01-31: 1000 mL via INTRAVENOUS

## 2015-01-31 MED ORDER — ASPIRIN EC 81 MG PO TBEC
81.0000 mg | DELAYED_RELEASE_TABLET | Freq: Every day | ORAL | Status: DC
  Administered 2015-01-31 – 2015-02-01 (×2): 81 mg via ORAL
  Filled 2015-01-31 (×2): qty 1

## 2015-01-31 MED ORDER — LACTULOSE 10 GM/15ML PO SOLN
30.0000 g | Freq: Three times a day (TID) | ORAL | Status: DC
  Administered 2015-01-31 – 2015-02-01 (×2): 30 g via ORAL
  Filled 2015-01-31 (×5): qty 45

## 2015-01-31 MED ORDER — PANTOPRAZOLE SODIUM 40 MG PO TBEC: 40.0000 mg | DELAYED_RELEASE_TABLET | Freq: Every day | ORAL | Status: DC

## 2015-01-31 MED ORDER — INSULIN ASPART 100 UNIT/ML ~~LOC~~ SOLN
0.0000 [IU] | Freq: Three times a day (TID) | SUBCUTANEOUS | Status: DC
  Administered 2015-01-31: 2 [IU] via SUBCUTANEOUS
  Administered 2015-02-01: 3 [IU] via SUBCUTANEOUS

## 2015-01-31 MED ORDER — ENALAPRIL MALEATE 20 MG PO TABS
20.0000 mg | ORAL_TABLET | Freq: Every day | ORAL | Status: DC
  Administered 2015-02-01: 20 mg via ORAL
  Filled 2015-01-31: qty 1

## 2015-01-31 MED ORDER — ATORVASTATIN CALCIUM 10 MG PO TABS
10.0000 mg | ORAL_TABLET | Freq: Every day | ORAL | Status: DC
  Administered 2015-01-31: 10 mg via ORAL
  Filled 2015-01-31 (×2): qty 1

## 2015-01-31 MED ORDER — NADOLOL 40 MG PO TABS
40.0000 mg | ORAL_TABLET | Freq: Every day | ORAL | Status: DC
Start: 2015-02-01 — End: 2015-02-01
  Filled 2015-01-31: qty 1

## 2015-01-31 NOTE — ED Notes (Signed)
Pt brought in via [facility]. Pt presents from home with family, pt was last seen normal last night at 2200 right before bed. When pt woke up this morning he was confused. Pt is A/O to self, location, and situation. Pt has no neurological deficits. Pt presented with the same symptoms 2 weeks ago, and had an elevated ammonia level secondary to his cirrhosis history.

## 2015-01-31 NOTE — ED Provider Notes (Signed)
CSN: [id number]     Arrival date & time 01/31/15  6270 History   First MD Initiated Contact with Patient 01/31/15 1001     Chief Complaint  Patient presents with  . Altered Mental Status     (Consider location/radiation/quality/duration/timing/severity/associated sxs/prior Treatment) HPI Patient is a 59 year old male with past medical history of liver cirrhosis, coronary artery disease with stent placement, diastolic CHF, last EF 3500%, hypertension, diabetes, esophageal varices who presents the ER by EMS for altered mental status. Patient's wife called 911 this morning due to noticing patient "not acting right". Patient's wife in the room with him states that she noticed patient acting identical to when he was using altered mental status several weeks ago when his ammonia level was high secondary to his liver cirrhosis. She states patient was confused, and could not answer questions appropriately. During my interview, patient is fully alert, can respond to verbal commands well. Patient can answer questions appropriately to person and place, however not time and event. Patient voices no complaints to me.  Past Medical History  Diagnosis Date  . Cirrhosis 2011    Cryptogenic, Likely NASH. Family/pt deny EtOH. HCV, HBV, HAV negative. ANA negative. AMA positive. Ascites 12/11  . Hyperlipemia   . Coronary artery disease     Inferior MI 12/11; LHC with occluded mid CFX and 80% proximal RCA. EF 55%. He had 3.0 x 28 vision BMS to CFX  . Diastolic CHF, acute     Echo 12/11 with ef 50-55% and mild LVH. EF 55% by LV0gram in 12/11  . Hypertension   . Type II diabetes mellitus 2011  . SVT (supraventricular tachycardia)     1/12: appeared to be an ectopic atrial tachycardia. Required DCCV with hemodynamic instability  . GI bleed     12/11: Etiology not clearly defined. EGD: nonbleeding esophageal varices.  . S/P coronary artery stent placement 06/2010  . Esophageal varices 2011, 2013    no hx acute  variceal bleed  . Hepatic encephalopathy 2011, 12/2013  . Myocardial infarction ? 2012  . Shortness of breath dyspnea   . Arthritis   . Hx of echocardiogram     Echo 5/16:  EF 55-60%, no RWMA, mod LAE  . Thrombocytopenia   . Cholelithiasis   . Rectal varices    Past Surgical History  Procedure Laterality Date  . Cardiac catheterization  07/01/2010    BMS to CFX.  Marland Kitchen Appendectomy    . Orif r leg    . Esophagogastroduodenoscopy  04/13/2012    Procedure: ESOPHAGOGASTRODUODENOSCOPY (EGD);  Surgeon: Inda Castle, MD;  Location: [facility];  Service: Endoscopy;  Laterality: N/A;  . Colonoscopy  04/13/2012    Procedure: COLONOSCOPY;  Surgeon: Inda Castle, MD;  Location: [facility] ENDOSCOPY;  Service: Endoscopy;  Laterality: N/A;  . Esophagogastroduodenoscopy N/A 01/01/2014    Procedure: ESOPHAGOGASTRODUODENOSCOPY (EGD);  Surgeon: Jerene Bears, MD;  Location: [facility] ENDOSCOPY;  Service: Endoscopy;  Laterality: N/A;  . Esophageal banding N/A 01/01/2014    Procedure: ESOPHAGEAL BANDING;  Surgeon: Jerene Bears, MD;  Location: [facility] ENDOSCOPY;  Service: Endoscopy;  Laterality: N/A;  . Coronary stent placement  06/30/2010    CFX   Distal        . Left heart catheterization with coronary angiogram N/A 10/15/2014    Procedure: LEFT HEART CATHETERIZATION WITH CORONARY ANGIOGRAM;  Surgeon: Peter M Martinique, MD;  Location: [facility] CATH LAB;  Service: Cardiovascular;  Laterality: N/A;   Family History  Problem Relation Age  of Onset  . Heart attack Brother 26    MI  . Heart attack Brother 64    MI  . Heart attack Father   . Diabetes Father   . Diabetes Brother   . COPD Mother   . Stroke Neg Hx    History  Substance Use Topics  . Smoking status: Current Every Day Smoker -- 1.00 packs/day for 36 years    Types: Cigarettes, E-cigarettes  . Smokeless tobacco: Current User     Comment: uses vapor cigarettes (2016 )  . Alcohol Use: No    Review of Systems  Unable to perform ROS: Mental status change       Allergies  Review of patient's allergies indicates no known allergies.  Home Medications   Prior to Admission medications   Medication Sig Start Date End Date Taking? Authorizing Provider  aspirin 81 MG tablet Take 81 mg by mouth daily.    Yes Historical Provider, MD  atorvastatin (LIPITOR) 10 MG tablet Take 1 tablet (10 mg total) by mouth daily. 01/15/15  Yes Larey Dresser, MD  enalapril (VASOTEC) 20 MG tablet Take 20 mg by mouth daily.   Yes Historical Provider, MD  furosemide (LASIX) 40 MG tablet Take 40 mg by mouth daily.  01/09/12  Yes Larey Dresser, MD  insulin NPH Human (HUMULIN N,NOVOLIN N) 100 UNIT/ML injection Inject 50-80 Units into the skin See admin instructions. Take 80 units in the morning and 50 units in the evening.   Yes Historical Provider, MD  isosorbide mononitrate (IMDUR) 30 MG 24 hr tablet Take 15 mg by mouth daily. 11/14/14  Yes Historical Provider, MD  lactulose (CHRONULAC) 10 GM/15ML solution Take 45 mLs (30 g total) by mouth 2 (two) times daily. Titrate to 2-4 bowel movements daily 01/20/15  Yes Corky Sox, MD  metFORMIN (GLUCOPHAGE) 1000 MG tablet Take 1,000 mg by mouth 2 (two) times daily. 11/27/14  Yes Historical Provider, MD  Multiple Vitamin (MULTIVITAMIN) capsule Take 1 capsule by mouth daily.     Yes Historical Provider, MD  nadolol (CORGARD) 40 MG tablet Take 1 tablet (40 mg total) by mouth daily. 01/01/15  Yes Jerene Bears, MD  omeprazole (PRILOSEC) 20 MG capsule Take 1 capsule (20 mg total) by mouth daily. 01/15/15  Yes Larey Dresser, MD  ranolazine (RANEXA) 500 MG 12 hr tablet Take 2 tablets (1,000 mg total) by mouth 2 (two) times daily. 01/15/15  Yes Larey Dresser, MD  sulfamethoxazole-trimethoprim (BACTRIM DS,SEPTRA DS) 800-160 MG per tablet Take 1 tablet by mouth daily.   Yes Historical Provider, MD  nitroGLYCERIN (NITROSTAT) 0.4 MG SL tablet Place 1 tablet (0.4 mg total) under the tongue every 5 (five) minutes as needed. 01/09/12   Larey Dresser,  MD   BP 118/60 mmHg  Pulse 57  Temp(Src) 97.8 F (36.6 C) (Oral)  Resp 17  Ht 5' 6"  (1.676 m)  Wt 276 lb 8 oz (125.42 kg)  BMI 44.65 kg/m2  SpO2 99% Physical Exam  Constitutional: He is oriented to person, place, and time. He appears well-developed and well-nourished. No distress.  HENT:  Head: Normocephalic and atraumatic.  Mouth/Throat: Oropharynx is clear and moist. No oropharyngeal exudate.  Eyes: Right eye exhibits no discharge. Left eye exhibits no discharge. No scleral icterus.  Neck: Normal range of motion.  Cardiovascular: Normal rate, regular rhythm and normal heart sounds.   No murmur heard. Pulmonary/Chest: Effort normal and breath sounds normal. No respiratory distress.  Abdominal: Soft.  There is no tenderness.  Musculoskeletal: Normal range of motion. He exhibits no edema or tenderness.  Neurological: He is alert and oriented to person, place, and time. He has normal strength. No cranial nerve deficit or sensory deficit. Coordination normal. GCS eye subscore is 4. GCS verbal subscore is 4. GCS motor subscore is 6.  Patient fully alert, answering questions appropriately to person and place only in full, clear sentences. Cranial nerves II through XII grossly intact. Motor strength 5 out of 5 in all major muscle groups of upper and lower extremities. Distal sensation intact.   Skin: Skin is warm and dry. No rash noted. He is not diaphoretic.  Psychiatric: He has a normal mood and affect.  Nursing note and vitals reviewed.   ED Course  Procedures (including critical care time) Labs Review Labs Reviewed  COMPREHENSIVE METABOLIC PANEL - Abnormal; Notable for the following:    Glucose, Bld 167 (*)    Albumin 2.9 (*)    AST 82 (*)    Alkaline Phosphatase 156 (*)    Total Bilirubin 2.0 (*)    All other components within normal limits  CBC - Abnormal; Notable for the following:    RBC 4.20 (*)    MCH 34.5 (*)    Platelets 51 (*)    All other components within normal  limits  AMMONIA - Abnormal; Notable for the following:    Ammonia 99 (*)    All other components within normal limits  URINALYSIS, ROUTINE W REFLEX MICROSCOPIC (NOT AT [facility]) - Abnormal; Notable for the following:    Glucose, UA 100 (*)    All other components within normal limits  CBG MONITORING, ED - Abnormal; Notable for the following:    Glucose-Capillary 149 (*)    All other components within normal limits  I-STAT CG4 LACTIC ACID, ED - Abnormal; Notable for the following:    Lactic Acid, Venous 2.26 (*)    All other components within normal limits  DIFFERENTIAL  CBG MONITORING, ED  Randolm Idol, ED    Imaging Review Dg Chest 2 View  01/31/2015   CLINICAL DATA:  Altered mental status this morning. Initial encounter.  EXAM: CHEST  2 VIEW  COMPARISON:  PA and lateral chest 01/19/2015.  FINDINGS: Lung volumes are low but the lungs are clear. Heart size is upper normal. No pneumothorax or pleural effusion.  IMPRESSION: No acute disease.   Electronically Signed   By: Inge Rise M.D.   On: 01/31/2015 12:48     EKG Interpretation   Date/Time:  Saturday January 31 2015 10:09:49 EDT Ventricular Rate:  61 PR Interval:  143 QRS Duration: 103 QT Interval:  463 QTC Calculation: 466 R Axis:   26 Text Interpretation:  Sinus rhythm Ventricular premature complex  Inferolateral infarct, old Baseline wander in lead(s) V4 No significant  change since last tracing Confirmed by [facility]  MD, Loree Fee ([id number]) on  01/31/2015 10:15:53 AM      MDM   Final diagnoses:  Hepatic encephalopathy    Patient here with signs and symptoms consistent with hepatic encephalopathy secondary to liver cirrhosis. This is thought to be metabolic versus neurologic due to the fact the patient had identical signs and symptoms several weeks ago, was recently admitted and discharged for these same issues. Patient's exam is benign overall, there is some confusion, however this appears to be encephalopathic  versus CVA. Neuro exam is benign otherwise. Ammonia level is 99, lactic acid is mildly elevated 2.26. Patient is without abdominal  pain or discomfort. No concern for acute abdomen. Patient admitted to medicine for further evaluation and treatment. The patient appears reasonably stabilized for admission considering the current resources, flow, and capabilities available in the ED at this time, and I doubt any other [facility] requiring further screening and/or treatment in the ED prior to admission.  BP 118/60 mmHg  Pulse 57  Temp(Src) 97.8 F (36.6 C) (Oral)  Resp 17  Ht 5' 6"  (1.676 m)  Wt 276 lb 8 oz (125.42 kg)  BMI 44.65 kg/m2  SpO2 99%  Signed,  Dahlia Bailiff, PA-C 4:47 PM  Patient seen and discussed with Dr. Blanchie Dessert, MD   Dahlia Bailiff, PA-C 01/31/15 1647  Blanchie Dessert, MD 02/01/15 1428

## 2015-01-31 NOTE — H&P (Signed)
Date: 01/31/2015               Patient Name:  Adam Butler MRN: [id number]  DOB: [birth year] Age / Sex: 59 y.o., male   PCP: Antonietta Jewel, MD         Medical Service: Internal Medicine Teaching Service         Attending Physician: Dr. Annia Belt, MD    First Contact: Dr. Marlowe Sax Pager: [phone]  Second Contact: Dr. Ronnald Ramp Pager: [phone]       After Hours (After 5p/  First Contact Pager: [phone]  weekends / holidays): Second Contact Pager: [phone]   Chief Complaint: confusion  History of Present Illness: 59 y/o male with PMH of cryptogenic cirrhosis. HLD, CAD s/p stent, HTN, DM, DVT, GI bleed secondary to esophageal varices, hepatic encephalopathy, thrombocytopenia who p/w confusion since this morning. Pts wife states he is taking his lactulose only once a day and pt himself admitted to being non-compliant with the medication. He is only taking it once a day despite having 2-3 BMs per day. Wife states he went to the bathroom this morning and made a "big mess" and looked confused. She evaluated him by asking questions and according to her he was not oriented to person, place, or time. Denies fever or chills. States that he vomited about a week ago for just one day after eating pizza. Denies any CP or SOB. Denies having hematemesis, hematochezia, or melena. Patient was AAOx3 when evaluated by [place].   Of note: Patient was recently admitted to our service 7/11 for worsening confusion 2/2 hepatic encephalopathy and non-compliance with lactulose. He was supposed to f/u with PCP on 7/29.     Meds: Current Facility-Administered Medications  Medication Dose Route Frequency Provider Last Rate Last Dose  . aspirin EC tablet 81 mg  81 mg Oral Daily Corky Sox, MD   81 mg at 01/31/15 1801  . atorvastatin (LIPITOR) tablet 10 mg  10 mg Oral q1800 Corky Sox, MD   10 mg at 01/31/15 1802  . [START ON 02/01/2015] enalapril (VASOTEC) tablet 20 mg  20 mg Oral Daily Corky Sox, MD      .  Derrill Memo ON 02/01/2015] furosemide (LASIX) tablet 40 mg  40 mg Oral Daily Corky Sox, MD      . insulin aspart (novoLOG) injection 0-15 Units  0-15 Units Subcutaneous TID WC Corky Sox, MD   2 Units at 01/31/15 1801  . insulin aspart (novoLOG) injection 0-5 Units  0-5 Units Subcutaneous QHS Corky Sox, MD      . isosorbide mononitrate (IMDUR) 24 hr tablet 15 mg  15 mg Oral Daily Corky Sox, MD   15 mg at 01/31/15 1801  . lactulose (CHRONULAC) 10 GM/15ML solution 30 g  30 g Oral TID Corky Sox, MD      . Derrill Memo ON 02/01/2015] nadolol (CORGARD) tablet 40 mg  40 mg Oral Daily Corky Sox, MD      . Derrill Memo ON 02/01/2015] pantoprazole (PROTONIX) EC tablet 40 mg  40 mg Oral Q1200 Corky Sox, MD      . ranolazine (RANEXA) 12 hr tablet 1,000 mg  1,000 mg Oral BID Corky Sox, MD        Allergies: Allergies as of 01/31/2015  . (No Known Allergies)   Past Medical History  Diagnosis Date  . Cirrhosis 2011    Cryptogenic, Likely NASH. Family/pt deny EtOH. HCV, HBV, HAV negative.  ANA negative. AMA positive. Ascites 12/11  . Hyperlipemia   . Coronary artery disease     Inferior MI 12/11; LHC with occluded mid CFX and 80% proximal RCA. EF 55%. He had 3.0 x 28 vision BMS to CFX  . Diastolic CHF, acute     Echo 12/11 with ef 50-55% and mild LVH. EF 55% by LV0gram in 12/11  . Hypertension   . Type II diabetes mellitus 2011  . SVT (supraventricular tachycardia)     1/12: appeared to be an ectopic atrial tachycardia. Required DCCV with hemodynamic instability  . GI bleed     12/11: Etiology not clearly defined. EGD: nonbleeding esophageal varices.  . S/P coronary artery stent placement 06/2010  . Esophageal varices 2011, 2013    no hx acute variceal bleed  . Hepatic encephalopathy 2011, 12/2013  . Myocardial infarction ? 2012  . Shortness of breath dyspnea   . Arthritis   . Hx of echocardiogram     Echo 5/16:  EF 55-60%, no RWMA, mod LAE  . Thrombocytopenia   . Cholelithiasis   . Rectal  varices    Past Surgical History  Procedure Laterality Date  . Cardiac catheterization  07/01/2010    BMS to CFX.  Marland Kitchen Appendectomy    . Orif r leg    . Esophagogastroduodenoscopy  04/13/2012    Procedure: ESOPHAGOGASTRODUODENOSCOPY (EGD);  Surgeon: Inda Castle, MD;  Location: [facility];  Service: Endoscopy;  Laterality: N/A;  . Colonoscopy  04/13/2012    Procedure: COLONOSCOPY;  Surgeon: Inda Castle, MD;  Location: [facility] ENDOSCOPY;  Service: Endoscopy;  Laterality: N/A;  . Esophagogastroduodenoscopy N/A 01/01/2014    Procedure: ESOPHAGOGASTRODUODENOSCOPY (EGD);  Surgeon: Jerene Bears, MD;  Location: [facility] ENDOSCOPY;  Service: Endoscopy;  Laterality: N/A;  . Esophageal banding N/A 01/01/2014    Procedure: ESOPHAGEAL BANDING;  Surgeon: Jerene Bears, MD;  Location: [facility] ENDOSCOPY;  Service: Endoscopy;  Laterality: N/A;  . Coronary stent placement  06/30/2010    CFX   Distal        . Left heart catheterization with coronary angiogram N/A 10/15/2014    Procedure: LEFT HEART CATHETERIZATION WITH CORONARY ANGIOGRAM;  Surgeon: Peter M Martinique, MD;  Location: [facility] CATH LAB;  Service: Cardiovascular;  Laterality: N/A;   Family History  Problem Relation Age of Onset  . Heart attack Brother 8    MI  . Heart attack Brother 20    MI  . Heart attack Father   . Diabetes Father   . Diabetes Brother   . COPD Mother   . Stroke Neg Hx    History   Social History  . Marital Status: Married    Spouse Name: N/A  . Number of Children: 3  . Years of Education: N/A   Occupational History  . Unemployed Other    Worked in maintenance prior   Social History Main Topics  . Smoking status: Current Every Day Smoker -- 1.00 packs/day for 36 years    Types: Cigarettes, E-cigarettes  . Smokeless tobacco: Current User     Comment: uses vapor cigarettes (2016 )  . Alcohol Use: No  . Drug Use: No  . Sexual Activity: Not on file   Other Topics Concern  . Not on file   Social History Narrative   Married     Gets regular exercise: walking    Review of Systems: Review of Systems  Constitutional: Negative for fever and chills.  HENT: Negative for ear pain.  Eyes: Negative for double vision and pain.  Respiratory: Negative for cough, sputum production and shortness of breath.   Cardiovascular: Negative for chest pain, palpitations and leg swelling.  Gastrointestinal: Positive for abdominal pain. Negative for blood in stool and melena.  Genitourinary: Negative.   Musculoskeletal: Negative.   Skin: Negative.   Neurological: Negative.  Negative for headaches.    Physical Exam: Blood pressure 118/60, pulse 57, temperature 97.8 F (36.6 C), temperature source Oral, resp. rate 17, height 5' 6"  (1.676 m), weight 276 lb 8 oz (125.42 kg), SpO2 99 %. Physical Exam  Constitutional: He is oriented to person, place, and time and well-developed, well-nourished, and in no distress. No distress.  obese  HENT:  Head: Normocephalic and atraumatic.  Eyes: EOM are normal. Pupils are equal, round, and reactive to light.  Neck: Normal range of motion. Neck supple. No tracheal deviation present.  Cardiovascular: Normal rate, regular rhythm, normal heart sounds and intact distal pulses.   Pulmonary/Chest: Effort normal and breath sounds normal. No respiratory distress. He has no wheezes. He has no rales.  Abdominal: Soft. Bowel sounds are normal. He exhibits distension. There is tenderness. There is no rebound and no guarding.  Tenderness on palpation of RUQ  Musculoskeletal: Normal range of motion.  Neurological: He is alert and oriented to person, place, and time. No cranial nerve deficit.  Skin: Skin is warm and dry. He is not diaphoretic.  Psychiatric: Mood and affect normal.    Lab results: Basic Metabolic Panel:  Recent Labs  01/31/15 1017  NA 137  K 3.8  CL 106  CO2 23  GLUCOSE 167*  BUN 6  CREATININE 0.86  CALCIUM 9.1   Liver Function Tests:  Recent Labs  01/31/15 1017  AST 82*   ALT 60  ALKPHOS 156*  BILITOT 2.0*  PROT 6.8  ALBUMIN 2.9*   No results for input(s): LIPASE, AMYLASE in the last 72 hours.  Recent Labs  01/31/15 1055  AMMONIA 99*   CBC:  Recent Labs  01/31/15 1017 01/31/15 1055  WBC 4.7  --   NEUTROABS  --  2.9  HGB 14.5  --   HCT 40.4  --   MCV 96.2  --   PLT 51*  --    CBG:  Recent Labs  01/31/15 1013 01/31/15 1708  GLUCAP 149* 198*   Urine Drug Screen: Drugs of Abuse     Component Value Date/Time   LABOPIA NONE DETECTED 01/19/2015 1318   COCAINSCRNUR NONE DETECTED 01/19/2015 1318   LABBENZ NONE DETECTED 01/19/2015 1318   AMPHETMU NONE DETECTED 01/19/2015 1318   THCU NONE DETECTED 01/19/2015 1318   LABBARB NONE DETECTED 01/19/2015 1318    Alcohol Level: No results for input(s): ETH in the last 72 hours. Urinalysis:  Recent Labs  01/31/15 1200  COLORURINE YELLOW  LABSPEC 1.013  PHURINE 7.0  GLUCOSEU 100*  HGBUR NEGATIVE  BILIRUBINUR NEGATIVE  KETONESUR NEGATIVE  PROTEINUR NEGATIVE  UROBILINOGEN 1.0  NITRITE NEGATIVE  LEUKOCYTESUR NEGATIVE    Imaging results:  Dg Chest 2 View  01/31/2015   CLINICAL DATA:  Altered mental status this morning. Initial encounter.  EXAM: CHEST  2 VIEW  COMPARISON:  PA and lateral chest 01/19/2015.  FINDINGS: Lung volumes are low but the lungs are clear. Heart size is upper normal. No pneumothorax or pleural effusion.  IMPRESSION: No acute disease.   Electronically Signed   By: Inge Rise M.D.   On: 01/31/2015 12:48    Other results: EKG:  Bradycardic HR 61 Sinus rhythm Ventricular premature complex Inferolateral infarct, old Baseline wander in lead(s) V4 No significant change since last tracing  Assessment & Plan by Problem: Active Problems:   Hepatic encephalopathy  Confusion likely 2/2 hepatic encephalopathy  Pt is AAOx3, oriented to person, place, and time. He has cryptogenic hepatic encephalopathy for which he should be taking lactulose BID, however, pts wife  confirmed that he is only taking the medication once a day. He did not take the medication this morning. Confusion most likely 2/2 buildup of ammonia, ammonia 99 today.  -lactulose 30g TID -Neuro checks q4 -F/u CBC and CMP  Abdominal Pain He has RUQ abdominal pain upon palpation. Likely 2/2 to liver cirrhosis. -F/u RUQ [place] r/o rule out SBP  Thrombocytopenia Platelets 51. Most likely 2/2 liver cirrhosis. Pt does not have any signs of abnormal or excessive bleeding.  -F/u AM CBC  Cryptogenic Liver cirrhosis -cont nadolol 55m QD   HLD -cont home med Lipitor 10 mg QD  dCHF -cont home med Enalapril 229mQD and Lasix 40 mg QD  CAD -cont ASA and Ranolazine 1000 mg BID -cont Imdur 15 mg QD  HTN BP 118/60 today.  - Cont home med Enalapril 20 mg QD  DMII Blood glucose 149 upon admission -SSI  GI ppx: Protonix  DVT ppx: SCDs  Diet: carb modified  Dispo: Disposition is deferred at this time, awaiting improvement of current medical problems. Anticipated discharge in approximately 1-2 day(s).   The patient does have a current PCP (Sami HaSheryle HailMD) and [facility].  The patient does not have transportation limitations that hinder transportation to clinic appointments.  Signed: VaShela LeffMD 01/31/2015, 6:18 PM

## 2015-02-01 ENCOUNTER — Observation Stay (HOSPITAL_COMMUNITY): Payer: PPO

## 2015-02-01 DIAGNOSIS — Z7982 Long term (current) use of aspirin: Secondary | ICD-10-CM

## 2015-02-01 DIAGNOSIS — R410 Disorientation, unspecified: Secondary | ICD-10-CM

## 2015-02-01 DIAGNOSIS — K7469 Other cirrhosis of liver: Secondary | ICD-10-CM | POA: Diagnosis not present

## 2015-02-01 DIAGNOSIS — I10 Essential (primary) hypertension: Secondary | ICD-10-CM

## 2015-02-01 DIAGNOSIS — R1011 Right upper quadrant pain: Secondary | ICD-10-CM | POA: Diagnosis not present

## 2015-02-01 DIAGNOSIS — I5032 Chronic diastolic (congestive) heart failure: Secondary | ICD-10-CM

## 2015-02-01 DIAGNOSIS — I8500 Esophageal varices without bleeding: Secondary | ICD-10-CM | POA: Insufficient documentation

## 2015-02-01 DIAGNOSIS — I252 Old myocardial infarction: Secondary | ICD-10-CM

## 2015-02-01 DIAGNOSIS — E119 Type 2 diabetes mellitus without complications: Secondary | ICD-10-CM

## 2015-02-01 DIAGNOSIS — Z9119 Patient's noncompliance with other medical treatment and regimen: Secondary | ICD-10-CM

## 2015-02-01 DIAGNOSIS — D61818 Other pancytopenia: Secondary | ICD-10-CM | POA: Insufficient documentation

## 2015-02-01 DIAGNOSIS — K766 Portal hypertension: Secondary | ICD-10-CM

## 2015-02-01 DIAGNOSIS — K7290 Hepatic failure, unspecified without coma: Principal | ICD-10-CM

## 2015-02-01 DIAGNOSIS — I2510 Atherosclerotic heart disease of native coronary artery without angina pectoris: Secondary | ICD-10-CM

## 2015-02-01 DIAGNOSIS — D732 Chronic congestive splenomegaly: Secondary | ICD-10-CM | POA: Insufficient documentation

## 2015-02-01 DIAGNOSIS — D696 Thrombocytopenia, unspecified: Secondary | ICD-10-CM

## 2015-02-01 DIAGNOSIS — E785 Hyperlipidemia, unspecified: Secondary | ICD-10-CM

## 2015-02-01 LAB — COMPREHENSIVE METABOLIC PANEL
ALBUMIN: 2.6 g/dL — AB (ref 3.5–5.0)
ALK PHOS: 128 U/L — AB (ref 38–126)
ALT: 57 U/L (ref 17–63)
ANION GAP: 8 (ref 5–15)
AST: 72 U/L — ABNORMAL HIGH (ref 15–41)
BUN: 6 mg/dL (ref 6–20)
CALCIUM: 8.4 mg/dL — AB (ref 8.9–10.3)
CO2: 22 mmol/L (ref 22–32)
CREATININE: 0.73 mg/dL (ref 0.61–1.24)
Chloride: 106 mmol/L (ref 101–111)
GFR calc Af Amer: >60 mL/min (ref >60)
GFR calc non Af Amer: >60 mL/min (ref >60)
Glucose, Bld: 213 mg/dL — ABNORMAL HIGH (ref 65–99)
Potassium: 3.7 mmol/L (ref 3.5–5.1)
SODIUM: 136 mmol/L (ref 135–145)
TOTAL PROTEIN: 6.2 g/dL — AB (ref 6.5–8.1)
Total Bilirubin: 1.4 mg/dL — ABNORMAL HIGH (ref 0.3–1.2)

## 2015-02-01 LAB — CBC
HEMATOCRIT: 37.5 % — AB (ref 39.0–52.0)
Hemoglobin: 13.1 g/dL (ref 13.0–17.0)
MCH: 34.2 pg — AB (ref 26.0–34.0)
MCHC: 34.9 g/dL (ref 30.0–36.0)
MCV: 97.9 fL (ref 78.0–100.0)
Platelets: 51 10*3/uL — ABNORMAL LOW (ref 150–400)
RBC: 3.83 MIL/uL — ABNORMAL LOW (ref 4.22–5.81)
RDW: 13.9 % (ref 11.5–15.5)
WBC: 3.9 10*3/uL — ABNORMAL LOW (ref 4.0–10.5)

## 2015-02-01 LAB — GLUCOSE, CAPILLARY: Glucose-Capillary: 153 mg/dL — ABNORMAL HIGH (ref 65–99)

## 2015-02-01 MED ORDER — LACTULOSE 10 GM/15ML PO SOLN: 30.0000 g | Freq: Two times a day (BID) | ORAL | Status: DC

## 2015-02-01 NOTE — Discharge Summary (Signed)
Name: Adam Butler MRN: [id number] DOB: Nov 09, 1955 59 y.o. PCP: Adam Jewel, MD  Date of Admission: 01/31/2015  9:56 AM Date of Discharge: 02/05/2015 Attending Physician: No att. providers found  Discharge Diagnosis:  Active Problems:   Hepatic encephalopathy   Cryptogenic cirrhosis   Portal hypertension with esophageal varices   Splenomegaly, congestive, chronic   Other pancytopenia  Discharge Medications:   Medication List    TAKE these medications        aspirin 81 MG tablet  Take 81 mg by mouth daily.     atorvastatin 10 MG tablet  Commonly known as:  LIPITOR  Take 1 tablet (10 mg total) by mouth daily.     enalapril 20 MG tablet  Commonly known as:  VASOTEC  Take 20 mg by mouth daily.     furosemide 40 MG tablet  Commonly known as:  LASIX  Take 40 mg by mouth daily.     insulin NPH Human 100 UNIT/ML injection  Commonly known as:  HUMULIN N,NOVOLIN N  Inject 50-80 Units into the skin See admin instructions. Take 80 units in the morning and 50 units in the evening.     isosorbide mononitrate 30 MG 24 hr tablet  Commonly known as:  IMDUR  Take 15 mg by mouth daily.     lactulose 10 GM/15ML solution  Commonly known as:  CHRONULAC  Take 45 mLs (30 g total) by mouth 2 (two) times daily. Titrate to 3-4 bowel movements daily     metFORMIN 1000 MG tablet  Commonly known as:  GLUCOPHAGE  Take 1,000 mg by mouth 2 (two) times daily.     multivitamin capsule  Take 1 capsule by mouth daily.     nadolol 40 MG tablet  Commonly known as:  CORGARD  Take 1 tablet (40 mg total) by mouth daily.     nitroGLYCERIN 0.4 MG SL tablet  Commonly known as:  NITROSTAT  Place 1 tablet (0.4 mg total) under the tongue every 5 (five) minutes as needed.     omeprazole 20 MG capsule  Commonly known as:  PRILOSEC  Take 1 capsule (20 mg total) by mouth daily.     ranolazine 500 MG 12 hr tablet  Commonly known as:  RANEXA  Take 2 tablets (1,000 mg total) by mouth 2 (two)  times daily.     sulfamethoxazole-trimethoprim 800-160 MG per tablet  Commonly known as:  BACTRIM DS,SEPTRA DS  Take 1 tablet by mouth daily.        Disposition and follow-up:   Adam Butler was discharged from [facility] in Stable condition.  At the hospital follow up visit please address:  1.  Confusion 2/2 hepatic encephalopathy Is he compliant with Lactulose and taking it as directed? Any episodes of confusion since discharge?  RUQ abdominal pain and ultrasound showing gallstones  Has pain gotten worse over time? Any fevers, chills, nausea, or vomiting? If so, consider referral to surgery.   2.  Labs / imaging needed at time of follow-up: CMP  3.  Pending labs/ test needing follow-up: None  Follow-up Appointments: Follow-up Information    Follow up with [facility], [place]. Schedule an appointment as soon as possible for a visit in 1 week.   Specialty:  Internal Medicine   Why:  Follow up   Contact information:   [address] [place]  [address] [phone]       Consultations:   None  Procedures Performed:  Dg Chest 2 View  01/31/2015   CLINICAL DATA:  Altered mental status this morning. Initial encounter.  EXAM: CHEST  2 VIEW  COMPARISON:  PA and lateral chest 01/19/2015.  FINDINGS: Lung volumes are low but the lungs are clear. Heart size is upper normal. No pneumothorax or pleural effusion.  IMPRESSION: No acute disease.   Electronically Signed   By: Inge Rise M.D.   On: 01/31/2015 12:48   Dg Chest 2 View  01/19/2015   CLINICAL DATA:  Altered mental status.  EXAM: CHEST  2 VIEW  COMPARISON:  10/14/2014  FINDINGS: The heart size and pulmonary vascularity are normal. No infiltrates or effusions. Markings are slightly accentuated due to a shallow inspiration. No acute osseous abnormality.  IMPRESSION: No acute abnormalities.   Electronically Signed   By: Lorriane Shire M.D.   On: 01/19/2015 13:40   Ct Head Wo  Contrast  01/19/2015   CLINICAL DATA:  Confused  EXAM: CT HEAD WITHOUT CONTRAST  TECHNIQUE: Contiguous axial images were obtained from the base of the skull through the vertex without intravenous contrast.  COMPARISON:  12/29/2013  FINDINGS: There is no evidence of mass effect, midline shift, or extra-axial fluid collections. There is no evidence of a space-occupying lesion or intracranial hemorrhage. There is no evidence of a cortical-based area of acute infarction. There is generalized cerebral atrophy. There is periventricular white matter low attenuation likely secondary to microangiopathy.  The ventricles and sulci are appropriate for the patient's age. The basal cisterns are patent.  Visualized portions of the orbits are unremarkable. The visualized portions of the paranasal sinuses and mastoid air cells are unremarkable. Cerebrovascular atherosclerotic calcifications are noted.  The osseous structures are unremarkable.  IMPRESSION: 1. No acute intracranial pathology. 2. Chronic microvascular disease and cerebral atrophy.   Electronically Signed   By: Kathreen Devoid   On: 01/19/2015 13:30   US Abdomen Limited Ruq  02/01/2015   CLINICAL DATA:  Right upper quadrant abdominal pain  EXAM: US ABDOMEN LIMITED - RIGHT UPPER QUADRANT  COMPARISON:  CT 12/29/2013  FINDINGS: Gallbladder:  Tiny dependent gallstones are noted, largest 6 mm. No gallbladder wall thickening, pericholecystic fluid, or sonographic Murphy sign identified.  Common bile duct:  Diameter: 5 mm  Liver:  Nodular hepatic contour with internal inhomogeneous echogenicity but no measurable mass or intrahepatic ductal dilatation. Trace perihepatic ascites.  IMPRESSION: Nodular hepatic contour suggesting cirrhosis, although other infiltrative disorders could appear similar.  Trace perihepatic ascites.  Gallstones without other sonographic evidence for acute cholecystitis.   Electronically Signed   By: Conchita Paris M.D.   On: 02/01/2015 10:13    Admission HPI: 59 y/o male with PMH of cryptogenic cirrhosis. HLD, CAD s/p stent, HTN, DM, DVT, GI bleed secondary to esophageal varices, hepatic encephalopathy, thrombocytopenia who p/w confusion since this morning. Pts wife states he is taking his lactulose only once a day and pt himself admitted to being non-compliant with the medication. He is only taking it once a day despite having 2-3 BMs per day. Wife states he went to the bathroom this morning and made a "big mess" and looked confused. She evaluated him by asking questions and according to her he was not oriented to person, place, or time. Denies fever or chills. States that he vomited about a week ago for just one day after eating pizza. Denies any CP or SOB. Denies having hematemesis, hematochezia, or melena. Patient was AAOx3 when evaluated by [place].   Of note: Patient was recently  admitted to our service 7/11 for worsening confusion 2/2 hepatic encephalopathy and non-compliance with lactulose. He was supposed to f/u with PCP on 7/29.   Hospital Course by problem list: Active Problems:   Hepatic encephalopathy   Cryptogenic cirrhosis   Portal hypertension with esophageal varices   Splenomegaly, congestive, chronic   Other pancytopenia   1. Confusion likely 2/2 hepatic encephalopathy Pt has cryptogenic hepatic encephalopathy for which he should be taking lactulose BID, however, pts wife confirmed that he was only taking the medication once a day. He did not take the medication the morning of admisssion, became confused, and then presented to the ED. Confusion most likely 2/2 buildup of ammonia, ammonia. He was given  Lactulose 30 g TID. On 7/24, pt was doing well today and did not appear to be confused. AAOx3, oriented to person, place, and time. He was discharged home with Lactulose 30g BID titrated to 3-4 BMs daily. He is to f/u with PCP Dr. Sheryle Hail within 1 week.   Abdominal Pain He had RUQ abdominal pain upon palpation. No  leukocytosis. No fevers, chills, nausea, vomiting, or diarrhea. RUQ US showed liver cirrhosis, trace perihepatic ascites, and gallstones. Findings do not support the diagnosis of SBP. Pain likely 2/2 to gallstones. He is to f/u with PCP within 1 week.   Thrombocytopenia Platelets 51. Most likely 2/2 liver cirrhosis. Pt did not have any signs of abnormal or excessive bleeding.   Cryptogenic Liver cirrhosis -cont nadolol 46m QD   HLD -cont home med Lipitor 10 mg QD  dCHF -cont home med Enalapril 242mQD and Lasix 40 mg QD  CAD -cont ASA and Ranolazine 1000 mg BID -cont Imdur 15 mg QD  HTN BP stable.  - Cont home med Enalapril 20 mg QD  DMII Blood glucose 149 upon admission -SSI  Discharge Vitals:   BP 122/48 mmHg  Pulse 65  Temp(Src) 98.4 F (36.9 C) (Oral)  Resp 18  Ht 5' 6"  (1.676 m)  Wt 276 lb 8 oz (125.42 kg)  BMI 44.65 kg/m2  SpO2 95%  Discharge Labs:  No results found for this or any previous visit (from the past 24 hour(s)).  Signed: VaShela LeffMD 02/05/2015, 8:56 PM    Services Ordered on Discharge: None Equipment Ordered on Discharge: None

## 2015-02-01 NOTE — Discharge Instructions (Signed)
Please make appointment with Dr. Sheryle Hail within 1 week for a follow up.   Lactulose 10 GM/15ML solution Please take 45 mLs (30 g total) by mouth 2 (two) times daily. Titrate to 3-4 bowel movements daily.   Hepatic Encephalopathy Hepatic encephalopathy is a syndrome. This is a set of symptoms that occur together. It is seen mostly in patients with damage to the liver known as cirrhosis. This is where normal liver tissue has been replaced by scar tissue.  Symptoms of the syndrome include:  Changes in personality.  Mental impairment.  A depressed level of consciousness. These changes occur because toxins build up in the bloodstream. The build up occurs because the scarred liver cannot rid toxins from the body. The most important of these toxins is ammonia. Toxins can cause abnormal behavior and confusion. Toxins in the blood stream can impair your ability to take care of yourself or others. Some people become very sleepy and cannot be woken easily. In severe cases, the patient lapses into a coma.  CAUSES  There are many things that can cause liver damage that can lead to buildup of toxins. These include:  Diseases that cause cirrhosis of the liver.  Long-term alcohol use with progressive liver damage.  Hepatitis B or C with ongoing infection and liver damage.  Patients without cirrhosis who have undergone shunt surgery.  Kidney failure.  Bleeding in the stomach or intestines.  Infection.  Constipation.  Medications that act upon the central nervous system.  Diuretic therapy.  Excessive dietary protein. SYMPTOMS  Symptoms of this syndrome are categorized or "staged" based on severity.   Stage 0. Minimal hepatic encephalopathy. No detectable changes in personality or behavior. Minimal changes in memory, concentration, mental function, and physical ability.  Stage 1. Some lack of awareness. Shortened attention span. Problems with addition or subtraction. Possible problems with  sleeping or a reversal of the normal sleep pattern. Euphoria, depression, or irritability may be present. Mild confusion. Slowing of mental ability. Tremors may be detected.  Stage 2. Lethargy or apathy. Disoriented. Strange behavior. Slurred speech. Obvious tremors. Drowsiness, unable to perform mental tasks. Personality changes, and confusion about time.  Stage 3. Very sleepy but can be aroused. Unable to perform mental tasks, cannot keep track of time and place, marked confusion, amnesia, occasional fits of rage, speech cannot be understood.  Stage 4. Coma with or without response to painful stimuli. DIAGNOSIS  In mild cases, a careful history and physical exam may lead your caregiver to consider possible mild hepatic encephalopathy as the cause of symptoms. The diagnosis is clearer in more severe cases. An elevated blood ammonia level is the classic blood test abnormality in patients with this syndrome. Other tests can be helpful to rule out other diseases.  TREATMENT   Medications are often used to lower the ammonia level in the blood. This usually leads to improvement.  Diets containing vegetable proteins are better than diets rich in animal protein, especially proteins derived from red meats. Eating well-cooked chicken and fish in addition to vegetable protein should be discussed with your caregiver. Malnourished patients are encouraged to add liquid nutritional supplements to their diet.  Antibiotics are sometimes used to try to lessen the volume of bacteria in the intestines that produce ammonia.  Moderate to severe cases of this syndrome usually require a hospital stay and medicine that is given directly into a vein (intravenously). HOME CARE INSTRUCTIONS  The goal at home is to avoid things that can make the condition worse  and lead to a buildup of ammonia in the blood.  Eat a well balanced diet. Your caregiver can help you with suggestions on this.  Talk to your caregiver before  taking vitamin supplements. Large doses of vitamins and minerals, especially vitamin A, iron, or copper, can worsen liver damage.  A low salt diet, water restriction, or diuretic medicine may be needed to reduce fluid retention.  Avoid alcohol and acetaminophen as well as any over-the-counter medications that contain acetaminophen (check labels). Only take over-the-counter or prescription medicines for pain, discomfort, or fever as directed by your caregiver.  Avoid drugs that are toxic to the liver. Review your medications (both prescription and non-prescription) with your caregiver to make sure those you are taking will not be harmful.  Blood tests may be needed. Follow your caregiver's advice regarding the timing of these.  With this condition you play a critical role in maintaining your own good health. The failure to follow your caregiver's advice and these instructions may result in permanent disability or death. SEEK MEDICAL CARE IF:   You have increasing fatigue or weakness.  You develop increasing swelling of the abdomen, hands, feet, legs or face.  You develop loss of appetite.  You are feeling sick to your stomach (nausea) and vomiting.  You develop jaundice. This is a yellow discoloration of the skin.  You develop worsening problems with concentration, confusion, and/or problems with sleep. SEEK IMMEDIATE MEDICAL CARE IF:   You vomit bright red blood or a coffee ground-looking material.  You have blood in your stools. Or the stools turn black and tarry.  You have a fever.  You develop easy bruising or bleeding.  You have a return of slurred speech, change in behavior, or confusion. MAKE SURE YOU:   Understand these instructions.  Will watch your condition.  Will get help right away if you are not doing well or get worse. Document Released: 09/06/2006 Document Revised: 09/19/2011 Document Reviewed: 06/13/2007 [facility] Patient Information 2015 [place], [place].  This information is not intended to replace advice given to you by your health care provider. Make sure you discuss any questions you have with your health care provider.

## 2015-02-01 NOTE — Progress Notes (Signed)
Subjective: Patient seen and examined at bedside today. He is AAOx3 and not confused. Denies having any fevers, chills, HA, CP, SOB, cough, nausea, vomiting, diarrhea, or abdominal pain. No other complaints.   Objective: Vital signs in last 24 hours: Filed Vitals:   01/31/15 1500 01/31/15 1531 01/31/15 2124 02/01/15 0607  BP: 118/57 118/60 121/52 122/48  Pulse: 54 57 58 65  Temp:  97.8 F (36.6 C) 98.4 F (36.9 C) 98.4 F (36.9 C)  TempSrc:  Oral Oral Oral  Resp:  17 17 18   Height:  5' 6"  (1.676 m)    Weight:  276 lb 8 oz (125.42 kg)    SpO2: 98% 99% 100% 95%   Weight change:   Intake/Output Summary (Last 24 hours) at 02/01/15 1443 Last data filed at 02/01/15 1040  Gross per 24 hour  Intake    200 ml  Output    200 ml  Net      0 ml   Physical Exam  Constitutional: He is oriented to person, place, and time and well-developed, well-nourished, and in no distress. obese  HENT:  Head: Normocephalic and atraumatic.  Eyes: EOM are normal. Pupils are equal, round, and reactive to light.  Neck: Normal range of motion. Neck supple. No tracheal deviation present.  Cardiovascular: Normal rate, regular rhythm, normal heart sounds and intact distal pulses.  Pulmonary/Chest: Effort normal and breath sounds normal. No respiratory distress. He has no wheezes. He has no rales.  Abdominal: Soft. Bowel sounds are normal. He exhibits distension. No tenderness. There is no rebound and no guarding.  Musculoskeletal: Normal range of motion.  Neurological: He is alert and oriented to person, place, and time. No cranial nerve deficit.  Skin: Skin is warm and dry. He is not diaphoretic.  Psychiatric: Mood and affect normal.   Lab Results: Basic Metabolic Panel:  Recent Labs Lab 01/31/15 1017 02/01/15 0450  NA 137 136  K 3.8 3.7  CL 106 106  CO2 23 22  GLUCOSE 167* 213*  BUN 6 6  CREATININE 0.86 0.73  CALCIUM 9.1 8.4*   Liver Function Tests:  Recent Labs Lab 01/31/15 1017  02/01/15 0450  AST 82* 72*  ALT 60 57  ALKPHOS 156* 128*  BILITOT 2.0* 1.4*  PROT 6.8 6.2*  ALBUMIN 2.9* 2.6*   No results for input(s): LIPASE, AMYLASE in the last 168 hours.  Recent Labs Lab 01/31/15 1055  AMMONIA 99*   CBC:  Recent Labs Lab 01/31/15 1017 01/31/15 1055 02/01/15 0450  WBC 4.7  --  3.9*  NEUTROABS  --  2.9  --   HGB 14.5  --  13.1  HCT 40.4  --  37.5*  MCV 96.2  --  97.9  PLT 51*  --  51*   CBG:  Recent Labs Lab 01/31/15 1013 01/31/15 1708 01/31/15 2121 02/01/15 0749  GLUCAP 149* 198* 276* 153*    Drugs of Abuse     Component Value Date/Time   LABOPIA NONE DETECTED 01/19/2015 1318   COCAINSCRNUR NONE DETECTED 01/19/2015 1318   LABBENZ NONE DETECTED 01/19/2015 1318   AMPHETMU NONE DETECTED 01/19/2015 1318   THCU NONE DETECTED 01/19/2015 1318   LABBARB NONE DETECTED 01/19/2015 1318    Urinalysis:  Recent Labs Lab 01/31/15 1200  COLORURINE YELLOW  LABSPEC 1.013  PHURINE 7.0  GLUCOSEU 100*  HGBUR NEGATIVE  BILIRUBINUR NEGATIVE  KETONESUR NEGATIVE  PROTEINUR NEGATIVE  UROBILINOGEN 1.0  NITRITE NEGATIVE  LEUKOCYTESUR NEGATIVE    Micro Results: No  results found for this or any previous visit (from the past 240 hour(s)). Studies/Results: Dg Chest 2 View  01/31/2015   CLINICAL DATA:  Altered mental status this morning. Initial encounter.  EXAM: CHEST  2 VIEW  COMPARISON:  PA and lateral chest 01/19/2015.  FINDINGS: Lung volumes are low but the lungs are clear. Heart size is upper normal. No pneumothorax or pleural effusion.  IMPRESSION: No acute disease.   Electronically Signed   By: Inge Rise M.D.   On: 01/31/2015 12:48   US Abdomen Limited Ruq  02/01/2015   CLINICAL DATA:  Right upper quadrant abdominal pain  EXAM: US ABDOMEN LIMITED - RIGHT UPPER QUADRANT  COMPARISON:  CT 12/29/2013  FINDINGS: Gallbladder:  Tiny dependent gallstones are noted, largest 6 mm. No gallbladder wall thickening, pericholecystic fluid, or  sonographic Murphy sign identified.  Common bile duct:  Diameter: 5 mm  Liver:  Nodular hepatic contour with internal inhomogeneous echogenicity but no measurable mass or intrahepatic ductal dilatation. Trace perihepatic ascites.  IMPRESSION: Nodular hepatic contour suggesting cirrhosis, although other infiltrative disorders could appear similar.  Trace perihepatic ascites.  Gallstones without other sonographic evidence for acute cholecystitis.   Electronically Signed   By: Conchita Paris M.D.   On: 02/01/2015 10:13   Medications: I have reviewed the patient's current medications. Scheduled Meds: . aspirin EC  81 mg Oral Daily  . atorvastatin  10 mg Oral q1800  . enalapril  20 mg Oral Daily  . furosemide  40 mg Oral Daily  . insulin aspart  0-15 Units Subcutaneous TID WC  . insulin aspart  0-5 Units Subcutaneous QHS  . isosorbide mononitrate  15 mg Oral Daily  . lactulose  30 g Oral TID  . nadolol  40 mg Oral Daily  . pantoprazole  40 mg Oral Q1200  . ranolazine  1,000 mg Oral BID   Continuous Infusions:  PRN Meds:. Assessment/Plan: Active Problems:   Hepatic encephalopathy  59 y/o male with PMH of cryptogenic cirrhosis. HLD, CAD s/p stent, HTN, DM, DVT, GI bleed secondary to esophageal varices, hepatic encephalopathy, thrombocytopenia who p/w confusion since this morning  Confusion likely 2/2 hepatic encephalopathy  He has cryptogenic hepatic encephalopathy for which he should be taking lactulose BID, however, pts wife confirmed that he is only taking the medication once a day. He did not take the medication yesterday morning, became confused, and then presented to the ED. Confusion most likely 2/2 buildup of ammonia, ammonia 99 yesterday. Pt doing well today and does not appear to be confused. AAOx3, oriented to person, place, and time. Likely discharge to home today. -given lactulose 30g TID here -pt to continued Lactulose 30g BID at home. Titrated to 3-4 BMs daily.  -he is to f/u  with PCP Dr. Sheryle Hail within 1 week.   Abdominal Pain Pt not complaining of abdominal pain anymore. No leukocytosis. No fevers, chills, nausea, vomiting, or diarrhea. RUQ US shows liver cirrhosis, trace perihepatic ascites, and gallstones. Findings do not support the diagnosis of SBP. Pain likely 2/2 to gallstones.   Thrombocytopenia Platelets 51. Most likely 2/2 liver cirrhosis. Pt does not have any signs of abnormal or excessive bleeding.  -F/u AM CBC  Cryptogenic Liver cirrhosis -cont nadolol 14m QD   HLD -cont home med Lipitor 10 mg QD  dCHF -cont home med Enalapril 216mQD and Lasix 40 mg QD  CAD -cont ASA and Ranolazine 1000 mg BID -cont Imdur 15 mg QD  HTN BP 118/60 today.  - Cont  home med Enalapril 20 mg QD  DMII Blood glucose 149 upon admission -SSI  GI ppx: Protonix  DVT ppx: SCDs  Diet: carb modified   Dispo: Disposition is deferred at this time, awaiting improvement of current medical problems.  Anticipated discharge in approximately 0 day(s).   The patient does have a current PCP (Sami Sheryle Hail, MD) and does need an [facility] hospital follow-up appointment after discharge.  The patient does not have transportation limitations that hinder transportation to clinic appointments.  .Services Needed at time of discharge: Y = Yes, Blank = No PT:   OT:   RN:   Equipment:   Other:     LOS: 1 day   Shela Leff, MD 02/01/2015, 2:43 PM

## 2015-02-01 NOTE — Progress Notes (Signed)
Pt verbalized understanding of discharge instruction. Pt IV dc with no complications. Pt education given Pt instructed when to follow up with physician and when their follow up appointments are. Pt dc with spouse and all belongings. Franchot Erichsen, RN

## 2015-02-10 ENCOUNTER — Telehealth: Payer: Self-pay | Admitting: Cardiology

## 2015-02-10 NOTE — Telephone Encounter (Signed)
New message      Pt c/o medication issue:  1. Name of Medication: ranexa 2. How are you currently taking this medication (dosage and times per day)?553m--2 tabs daily  3. Are you having a reaction (difficulty breathing--STAT)?no  4. What is your medication issue?pt is having frequent nosebleeds.  Could it be the medication?

## 2015-02-10 NOTE — Telephone Encounter (Signed)
Ranexa is not causing the nose bleeds.

## 2015-02-10 NOTE — Telephone Encounter (Signed)
Called patient back. Informed him of Dr. Claris Gladden message. Patient verbalized understanding.

## 2015-02-10 NOTE — Telephone Encounter (Signed)
Patient complaining of several nose bleeds a day for 3 days. Patient is going to see his PCP on 8/9. Patient thinks Ranexa is part of the nose bleed problem. Patient states that the bleeding stops but takes a while. Patient stated that once a day there is always one nose bleed that is larger then others, with a gush of blood coming out his nose. Informed patient that he has other factors that might be causing nose bleeds. Patient had some emesis night before last, but no signs or symptoms of bleeding at that time. Advised patient to call PCP. Informed patient that message would be sent to Dr. Aundra Dubin for further instructions about his ranexa. Marland Kitchen

## 2015-02-11 ENCOUNTER — Telehealth: Payer: Self-pay | Admitting: Internal Medicine

## 2015-02-11 NOTE — Telephone Encounter (Signed)
Pt and pts wife states that if he does not get up early in the morning and start taking his lactulose he is quite confused and does not remember to take his lactulose. Wife states that his ammonia has been going up when he forgets his meds. Pt has also thrown up after taking the med. Wife states they cannot afford xifaxan. Wife also states the pt has had several nose bleeds the past several days, states he can just be sitting there and the bleeding starts. Pt has notified Dr. Aundra Dubin regarding the nosebleeds. Pt reports he is having 4-5 loose stools/day. Pt wants to know what they can do to help with this. Please advise.

## 2015-02-11 NOTE — Telephone Encounter (Signed)
Attempt to obtain from specialty pharmacy which may help with insurance coverage, if unhelpful please contact the drug rep to see if there is assistance available Indication: HE Rifaximin 550 mg BID  I can't increase lactulose if he is already having 4-5 BMs per day

## 2015-02-12 NOTE — Telephone Encounter (Signed)
Paperwork sent to [employer]. Pt aware that office is trying to get xifaxan for him.

## 2015-02-13 ENCOUNTER — Other Ambulatory Visit: Payer: Self-pay

## 2015-02-13 MED ORDER — RIFAXIMIN 550 MG PO TABS: 550.0000 mg | ORAL_TABLET | Freq: Two times a day (BID) | ORAL | Status: DC

## 2015-02-13 NOTE — Telephone Encounter (Signed)
Noted  

## 2015-02-13 NOTE — Telephone Encounter (Signed)
Received call from [employer] that the pt has a copay of almost 600.00 for the xifaxan. Will contact drug rep for possible assistance.

## 2015-02-13 NOTE — Telephone Encounter (Signed)
Todd from [employer] in again on this. States that we can call him anytime if we have any additional questions regarding this patient.

## 2015-02-16 ENCOUNTER — Other Ambulatory Visit (INDEPENDENT_AMBULATORY_CARE_PROVIDER_SITE_OTHER): Payer: PPO | Admitting: *Deleted

## 2015-02-16 DIAGNOSIS — E785 Hyperlipidemia, unspecified: Secondary | ICD-10-CM | POA: Diagnosis not present

## 2015-02-16 LAB — LIPID PANEL
Cholesterol: 160 mg/dL (ref 0–200)
HDL: 47 mg/dL (ref >39.00)
LDL Cholesterol: 94 mg/dL (ref 0–99)
NonHDL: 112.98
Total CHOL/HDL Ratio: 3
Triglycerides: 95 mg/dL (ref 0.0–149.0)
VLDL: 19 mg/dL (ref 0.0–40.0)

## 2015-02-16 LAB — HEPATIC FUNCTION PANEL
ALBUMIN: 3 g/dL — AB (ref 3.5–5.2)
ALT: 46 U/L (ref 0–53)
AST: 53 U/L — AB (ref 0–37)
Alkaline Phosphatase: 121 U/L — ABNORMAL HIGH (ref 39–117)
BILIRUBIN TOTAL: 1.6 mg/dL — AB (ref 0.2–1.2)
Bilirubin, Direct: 0.4 mg/dL — ABNORMAL HIGH (ref 0.0–0.3)
Total Protein: 6.6 g/dL (ref 6.0–8.3)

## 2015-02-16 NOTE — Addendum Note (Signed)
Addended by: Eulis Foster on: 02/16/2015 09:02 AM   Modules accepted: Orders

## 2015-02-21 ENCOUNTER — Observation Stay (HOSPITAL_COMMUNITY)
Admission: EM | Admit: 2015-02-21 | Discharge: 2015-02-22 | Disposition: A | Discharge status: Home / Self Care | Payer: PPO | Attending: Internal Medicine | Admitting: Internal Medicine

## 2015-02-21 ENCOUNTER — Encounter (HOSPITAL_COMMUNITY): Payer: Self-pay | Admitting: Emergency Medicine

## 2015-02-21 DIAGNOSIS — D696 Thrombocytopenia, unspecified: Secondary | ICD-10-CM | POA: Diagnosis present

## 2015-02-21 DIAGNOSIS — F17210 Nicotine dependence, cigarettes, uncomplicated: Secondary | ICD-10-CM | POA: Insufficient documentation

## 2015-02-21 DIAGNOSIS — K7460 Unspecified cirrhosis of liver: Secondary | ICD-10-CM | POA: Insufficient documentation

## 2015-02-21 DIAGNOSIS — E7220 Disorder of urea cycle metabolism, unspecified: Secondary | ICD-10-CM

## 2015-02-21 DIAGNOSIS — K7200 Acute and subacute hepatic failure without coma: Secondary | ICD-10-CM

## 2015-02-21 DIAGNOSIS — Z79899 Other long term (current) drug therapy: Secondary | ICD-10-CM | POA: Diagnosis not present

## 2015-02-21 DIAGNOSIS — E785 Hyperlipidemia, unspecified: Secondary | ICD-10-CM | POA: Diagnosis not present

## 2015-02-21 DIAGNOSIS — Z23 Encounter for immunization: Secondary | ICD-10-CM | POA: Insufficient documentation

## 2015-02-21 DIAGNOSIS — I252 Old myocardial infarction: Secondary | ICD-10-CM | POA: Insufficient documentation

## 2015-02-21 DIAGNOSIS — K7581 Nonalcoholic steatohepatitis (NASH): Secondary | ICD-10-CM | POA: Insufficient documentation

## 2015-02-21 DIAGNOSIS — E119 Type 2 diabetes mellitus without complications: Secondary | ICD-10-CM | POA: Diagnosis not present

## 2015-02-21 DIAGNOSIS — I5032 Chronic diastolic (congestive) heart failure: Secondary | ICD-10-CM | POA: Diagnosis not present

## 2015-02-21 DIAGNOSIS — Z7982 Long term (current) use of aspirin: Secondary | ICD-10-CM | POA: Insufficient documentation

## 2015-02-21 DIAGNOSIS — IMO0002 Reserved for concepts with insufficient information to code with codable children: Secondary | ICD-10-CM | POA: Diagnosis present

## 2015-02-21 DIAGNOSIS — I8510 Secondary esophageal varices without bleeding: Secondary | ICD-10-CM | POA: Insufficient documentation

## 2015-02-21 DIAGNOSIS — I10 Essential (primary) hypertension: Secondary | ICD-10-CM | POA: Diagnosis not present

## 2015-02-21 DIAGNOSIS — Z794 Long term (current) use of insulin: Secondary | ICD-10-CM | POA: Diagnosis not present

## 2015-02-21 DIAGNOSIS — I2510 Atherosclerotic heart disease of native coronary artery without angina pectoris: Secondary | ICD-10-CM | POA: Insufficient documentation

## 2015-02-21 DIAGNOSIS — K7682 Hepatic encephalopathy: Secondary | ICD-10-CM

## 2015-02-21 DIAGNOSIS — Z955 Presence of coronary angioplasty implant and graft: Secondary | ICD-10-CM | POA: Insufficient documentation

## 2015-02-21 DIAGNOSIS — E1165 Type 2 diabetes mellitus with hyperglycemia: Secondary | ICD-10-CM | POA: Diagnosis present

## 2015-02-21 DIAGNOSIS — R4182 Altered mental status, unspecified: Secondary | ICD-10-CM | POA: Diagnosis present

## 2015-02-21 DIAGNOSIS — K7290 Hepatic failure, unspecified without coma: Principal | ICD-10-CM | POA: Insufficient documentation

## 2015-02-21 DIAGNOSIS — K7469 Other cirrhosis of liver: Secondary | ICD-10-CM | POA: Diagnosis not present

## 2015-02-21 LAB — URINALYSIS, ROUTINE W REFLEX MICROSCOPIC
BILIRUBIN URINE: NEGATIVE
Glucose, UA: 500 mg/dL — AB
Hgb urine dipstick: NEGATIVE
KETONES UR: NEGATIVE mg/dL
LEUKOCYTES UA: NEGATIVE
NITRITE: NEGATIVE
PH: 7.5 (ref 5.0–8.0)
PROTEIN: NEGATIVE mg/dL
Specific Gravity, Urine: 1.02 (ref 1.005–1.030)
UROBILINOGEN UA: 4 mg/dL — AB (ref 0.0–1.0)

## 2015-02-21 LAB — PROTIME-INR
INR: 1.25 (ref 0.00–1.49)
PROTHROMBIN TIME: 15.8 s — AB (ref 11.6–15.2)

## 2015-02-21 LAB — CBC
HCT: 40.6 % (ref 39.0–52.0)
Hemoglobin: 14 g/dL (ref 13.0–17.0)
MCH: 34.3 pg — AB (ref 26.0–34.0)
MCHC: 34.5 g/dL (ref 30.0–36.0)
MCV: 99.5 fL (ref 78.0–100.0)
Platelets: 53 10*3/uL — ABNORMAL LOW (ref 150–400)
RBC: 4.08 MIL/uL — ABNORMAL LOW (ref 4.22–5.81)
RDW: 13.9 % (ref 11.5–15.5)
WBC: 4.1 10*3/uL (ref 4.0–10.5)

## 2015-02-21 LAB — COMPREHENSIVE METABOLIC PANEL
ALK PHOS: 180 U/L — AB (ref 38–126)
ALT: 46 U/L (ref 17–63)
ANION GAP: 9 (ref 5–15)
AST: 64 U/L — ABNORMAL HIGH (ref 15–41)
Albumin: 2.8 g/dL — ABNORMAL LOW (ref 3.5–5.0)
BUN: 9 mg/dL (ref 6–20)
CALCIUM: 8.6 mg/dL — AB (ref 8.9–10.3)
CHLORIDE: 107 mmol/L (ref 101–111)
CO2: 25 mmol/L (ref 22–32)
CREATININE: 0.85 mg/dL (ref 0.61–1.24)
GLUCOSE: 142 mg/dL — AB (ref 65–99)
Potassium: 4.9 mmol/L (ref 3.5–5.1)
Sodium: 141 mmol/L (ref 135–145)
TOTAL PROTEIN: 6 g/dL — AB (ref 6.5–8.1)
Total Bilirubin: 1.7 mg/dL — ABNORMAL HIGH (ref 0.3–1.2)

## 2015-02-21 LAB — RAPID URINE DRUG SCREEN, HOSP PERFORMED
Amphetamines: NOT DETECTED
Barbiturates: NOT DETECTED
Benzodiazepines: NOT DETECTED
COCAINE: NOT DETECTED
Opiates: NOT DETECTED
TETRAHYDROCANNABINOL: NOT DETECTED

## 2015-02-21 LAB — AMMONIA: AMMONIA: 187 umol/L — AB (ref 9–35)

## 2015-02-21 LAB — GLUCOSE, CAPILLARY: Glucose-Capillary: 101 mg/dL — ABNORMAL HIGH (ref 65–99)

## 2015-02-21 LAB — ETHANOL: Alcohol, Ethyl (B): <5 mg/dL (ref <5)

## 2015-02-21 MED ORDER — ASPIRIN 81 MG PO TABS: 81.0000 mg | ORAL_TABLET | Freq: Every day | ORAL | Status: DC

## 2015-02-21 MED ORDER — SODIUM CHLORIDE 0.9 % IV SOLN
INTRAVENOUS | Status: DC
  Administered 2015-02-21: 20 mL/h via INTRAVENOUS

## 2015-02-21 MED ORDER — SODIUM CHLORIDE 0.9 % IJ SOLN
3.0000 mL | Freq: Two times a day (BID) | INTRAMUSCULAR | Status: DC
  Administered 2015-02-22: 3 mL via INTRAVENOUS

## 2015-02-21 MED ORDER — LACTULOSE 10 GM/15ML PO SOLN
30.0000 g | Freq: Two times a day (BID) | ORAL | Status: DC
  Administered 2015-02-21 – 2015-02-22 (×2): 30 g via ORAL
  Filled 2015-02-21 (×3): qty 45

## 2015-02-21 MED ORDER — NITROGLYCERIN 0.4 MG SL SUBL: 0.4000 mg | SUBLINGUAL_TABLET | SUBLINGUAL | Status: DC | PRN

## 2015-02-21 MED ORDER — LACTULOSE 10 GM/15ML PO SOLN
30.0000 g | Freq: Once | ORAL | Status: AC
  Administered 2015-02-21: 30 g via ORAL
  Filled 2015-02-21: qty 45

## 2015-02-21 MED ORDER — SODIUM CHLORIDE 0.9 % IV SOLN
INTRAVENOUS | Status: AC
  Administered 2015-02-21: 22:00:00 via INTRAVENOUS

## 2015-02-21 MED ORDER — ASPIRIN 81 MG PO CHEW
81.0000 mg | CHEWABLE_TABLET | Freq: Every day | ORAL | Status: DC
  Administered 2015-02-22: 81 mg via ORAL
  Filled 2015-02-21: qty 1

## 2015-02-21 MED ORDER — RIFAXIMIN 550 MG PO TABS
550.0000 mg | ORAL_TABLET | Freq: Two times a day (BID) | ORAL | Status: DC
  Administered 2015-02-21 – 2015-02-22 (×2): 550 mg via ORAL
  Filled 2015-02-21 (×3): qty 1

## 2015-02-21 MED ORDER — PNEUMOCOCCAL VAC POLYVALENT 25 MCG/0.5ML IJ INJ
0.5000 mL | INJECTION | INTRAMUSCULAR | Status: AC
  Administered 2015-02-22: 0.5 mL via INTRAMUSCULAR
  Filled 2015-02-21: qty 0.5

## 2015-02-21 MED ORDER — SULFAMETHOXAZOLE-TRIMETHOPRIM 800-160 MG PO TABS
1.0000 | ORAL_TABLET | Freq: Every day | ORAL | Status: DC
  Administered 2015-02-22: 1 via ORAL
  Filled 2015-02-21: qty 1

## 2015-02-21 MED ORDER — INSULIN NPH (HUMAN) (ISOPHANE) 100 UNIT/ML ~~LOC~~ SUSP
25.0000 [IU] | Freq: Every day | SUBCUTANEOUS | Status: DC
  Filled 2015-02-21: qty 10

## 2015-02-21 MED ORDER — INSULIN NPH (HUMAN) (ISOPHANE) 100 UNIT/ML ~~LOC~~ SUSP
40.0000 [IU] | Freq: Every day | SUBCUTANEOUS | Status: DC
  Filled 2015-02-21: qty 10

## 2015-02-21 MED ORDER — ENALAPRIL MALEATE 10 MG PO TABS
10.0000 mg | ORAL_TABLET | Freq: Every day | ORAL | Status: DC
  Administered 2015-02-22: 10 mg via ORAL
  Filled 2015-02-21: qty 1

## 2015-02-21 MED ORDER — FUROSEMIDE 40 MG PO TABS
40.0000 mg | ORAL_TABLET | Freq: Every day | ORAL | Status: DC
  Administered 2015-02-22: 40 mg via ORAL
  Filled 2015-02-21: qty 1

## 2015-02-21 MED ORDER — NADOLOL 40 MG PO TABS
40.0000 mg | ORAL_TABLET | Freq: Every day | ORAL | Status: DC
  Administered 2015-02-22: 40 mg via ORAL
  Filled 2015-02-21: qty 1

## 2015-02-21 MED ORDER — RANOLAZINE ER 500 MG PO TB12
1000.0000 mg | ORAL_TABLET | Freq: Two times a day (BID) | ORAL | Status: DC
  Administered 2015-02-22: 1000 mg via ORAL
  Filled 2015-02-21 (×2): qty 2

## 2015-02-21 NOTE — ED Notes (Signed)
Attempted to call report

## 2015-02-21 NOTE — ED Notes (Signed)
Pt waking up, confused, asking to "go please" states "If you move I will move" -- drinking lactulose with encouragement

## 2015-02-21 NOTE — H&P (Signed)
Date: 02/21/2015               Patient Name:  Adam Butler MRN: [id number]  DOB: [birth year] Age / Sex: 59 y.o., male   PCP: Adam Jewel, MD         Medical Service: Internal Medicine Teaching Service         Attending Physician: Dr. Oval Linsey, MD    First Contact: Dr. Liberty Handy Pager: [phone]  Second Contact: Dr. Duwaine Maxin Pager: [phone]       After Hours (After 5p/  First Contact Pager: [phone]  weekends / holidays): Second Contact Pager: [phone]   Chief Complaint: Altered Mental Status  History of Present Illness: Adam Butler is a 59 year old with a PMH of nonalcoholic cirrhosis with recurrent hepatic encephalopathy, HLD, CAD s/p stent (MI 2011), HTN, T2DM, DVT, GI bleed secondary to esophageal varices who presents with altered mental status since this morning. His wife Adam Butler, was present to relay this history. When speaking to the patient, he only says his name and "[phone]," regardless of the question. Prior to this morning, Adam Butler had gone out to eat with his family and appeared to be his normal self, oriented and interactive. However, his wife Adam Butler noticed that he had been sleeping longer than usual this morning (8/13). She noticed his mental status was acutely changed, which she had seen many times before, and knew it was time to go to the hospital. Notably, the was a medication change last week with the addition of rifaximin by his gastroenterologist, Adam Butler. This was intended to be an additional therapy to his regimen of lactulose 30 g twice a day, but Adam Butler interpreted the rifaximin as being a replacement for lactulose. Therefore, his last dose of lactulose was last Friday (8/5), and his BMs went from 4-5 per day to one. This summer, he has had multiple admissions for similar symptoms that were followed by dose escalations for lactulose. Otherwise, she says he has been vomiting meals every other day for about two weeks. She says he has not been  vomiting any blood, and she has noticed any worsening abdominal distension. She also says he has had some dyspnea on exertion without chest pain. She reports that he has been smoking, has not been using street drugs, and does not drink alcohol.   Meds: Current Facility-Administered Medications  Medication Dose Route Frequency Provider Last Rate Last Dose  . [START ON 02/22/2015] aspirin tablet 81 mg  81 mg Oral Daily Adam Oman, DO      . [START ON 02/22/2015] enalapril (VASOTEC) tablet 10 mg  10 mg Oral Daily Adam Oman, DO      . [START ON 02/22/2015] furosemide (LASIX) tablet 40 mg  40 mg Oral Daily Adam Oman, DO      . insulin NPH Human (HUMULIN N,NOVOLIN N) injection 25 Units  25 Units Subcutaneous QPM Adam Oman, DO      . [START ON 02/22/2015] insulin NPH Human (HUMULIN N,NOVOLIN N) injection 40 Units  40 Units Subcutaneous QAC breakfast Adam Oman, DO      . lactulose (CHRONULAC) 10 GM/15ML solution 30 g  30 g Oral BID Adam Oman, DO      . rifaximin Doreene Nest) tablet 550 mg  550 mg Oral BID Adam Oman, DO      . sodium chloride 0.9 % injection 3 mL  3 mL Intravenous Q12H Adam Oman, DO  Current Outpatient Prescriptions  Medication Sig Dispense Refill  . aspirin 81 MG tablet Take 81 mg by mouth daily.     Marland Kitchen atorvastatin (LIPITOR) 10 MG tablet Take 1 tablet (10 mg total) by mouth daily. 30 tablet 6  . enalapril (VASOTEC) 20 MG tablet Take 10 mg by mouth daily.     . furosemide (LASIX) 40 MG tablet Take 40 mg by mouth daily.     . insulin NPH Human (HUMULIN N,NOVOLIN N) 100 UNIT/ML injection Inject 50-80 Units into the skin See admin instructions. Take 80 units in the morning and 50 units in the evening.    . metFORMIN (GLUCOPHAGE) 1000 MG tablet Take 1,000 mg by mouth 2 (two) times daily.  11  . Multiple Vitamin (MULTIVITAMIN) capsule Take 1 capsule by mouth daily.      . nadolol (CORGARD) 40 MG tablet Take 1 tablet (40 mg total) by mouth daily. 30 tablet 5  .  nitroGLYCERIN (NITROSTAT) 0.4 MG SL tablet Place 1 tablet (0.4 mg total) under the tongue every 5 (five) minutes as needed. 30 tablet 6  . omeprazole (PRILOSEC) 20 MG capsule Take 1 capsule (20 mg total) by mouth daily. 30 capsule 6  . ranolazine (RANEXA) 500 MG 12 hr tablet Take 2 tablets (1,000 mg total) by mouth 2 (two) times daily. 60 tablet 11  . rifaximin (XIFAXAN) 550 MG TABS tablet Take 1 tablet (550 mg total) by mouth 2 (two) times daily. 24 tablet 0  . sulfamethoxazole-trimethoprim (BACTRIM DS,SEPTRA DS) 800-160 MG per tablet Take 1 tablet by mouth daily.    Marland Kitchen lactulose (CHRONULAC) 10 GM/15ML solution Take 45 mLs (30 g total) by mouth 2 (two) times daily. Titrate to 3-4 bowel movements daily (Patient not taking: Reported on 02/21/2015) 1892 mL 5    Allergies: Allergies as of 02/21/2015 - Review Complete 02/21/2015  Allergen Reaction Noted  . Isosorbide  02/21/2015   Past Medical History  Diagnosis Date  . Cirrhosis 2011    Cryptogenic, Likely NASH. Family/pt deny EtOH. HCV, HBV, HAV negative. ANA negative. AMA positive. Ascites 12/11  . Hyperlipemia   . Coronary artery disease     Inferior MI 12/11; LHC with occluded mid CFX and 80% proximal RCA. EF 55%. He had 3.0 x 28 vision BMS to CFX  . Diastolic CHF, acute     Echo 12/11 with ef 50-55% and mild LVH. EF 55% by LV0gram in 12/11  . Hypertension   . Type II diabetes mellitus 2011  . SVT (supraventricular tachycardia)     1/12: appeared to be an ectopic atrial tachycardia. Required DCCV with hemodynamic instability  . GI bleed     12/11: Etiology not clearly defined. EGD: nonbleeding esophageal varices.  . S/P coronary artery stent placement 06/2010  . Esophageal varices 2011, 2013    no hx acute variceal bleed  . Hepatic encephalopathy 2011, 12/2013  . Myocardial infarction ? 2012  . Shortness of breath dyspnea   . Arthritis   . Hx of echocardiogram     Echo 5/16:  EF 55-60%, no RWMA, mod LAE  . Thrombocytopenia   .  Cholelithiasis   . Rectal varices    Past Surgical History  Procedure Laterality Date  . Cardiac catheterization  07/01/2010    BMS to CFX.  Marland Kitchen Appendectomy    . Orif r leg    . Esophagogastroduodenoscopy  04/13/2012    Procedure: ESOPHAGOGASTRODUODENOSCOPY (EGD);  Surgeon: Inda Castle, MD;  Location: [facility];  Service:  Endoscopy;  Laterality: N/A;  . Colonoscopy  04/13/2012    Procedure: COLONOSCOPY;  Surgeon: Inda Castle, MD;  Location: [facility] ENDOSCOPY;  Service: Endoscopy;  Laterality: N/A;  . Esophagogastroduodenoscopy N/A 01/01/2014    Procedure: ESOPHAGOGASTRODUODENOSCOPY (EGD);  Surgeon: Jerene Bears, MD;  Location: [facility] ENDOSCOPY;  Service: Endoscopy;  Laterality: N/A;  . Esophageal banding N/A 01/01/2014    Procedure: ESOPHAGEAL BANDING;  Surgeon: Jerene Bears, MD;  Location: [facility] ENDOSCOPY;  Service: Endoscopy;  Laterality: N/A;  . Coronary stent placement  06/30/2010    CFX   Distal        . Left heart catheterization with coronary angiogram N/A 10/15/2014    Procedure: LEFT HEART CATHETERIZATION WITH CORONARY ANGIOGRAM;  Surgeon: Peter M Martinique, MD;  Location: [facility] CATH LAB;  Service: Cardiovascular;  Laterality: N/A;   Family History  Problem Relation Age of Onset  . Heart attack Brother 40    MI  . Heart attack Brother 17    MI  . Heart attack Father   . Diabetes Father   . Diabetes Brother   . COPD Mother   . Stroke Neg Hx    Social History   Social History  . Marital Status: Married    Spouse Name: N/A  . Number of Children: 3  . Years of Education: N/A   Occupational History  . Unemployed Other    Worked in maintenance prior   Social History Main Topics  . Smoking status: Current Every Day Smoker -- 1.00 packs/day for 36 years    Types: Cigarettes, E-cigarettes  . Smokeless tobacco: Current User     Comment: uses vapor cigarettes (2016 )  . Alcohol Use: No  . Drug Use: No  . Sexual Activity: Not on file   Other Topics Concern  . Not on file    Social History Narrative   Married   Gets regular exercise: walking    Review of Systems: Negative except per HPI  Physical Exam: Blood pressure 122/62, pulse 61, temperature 97.4 F (36.3 C), temperature source Oral, resp. rate 17, SpO2 99 %.  General: Morbidly obese, lying on back in no acute distress HEENT: No scleral icterus, not following request to open mouth Cardiovascular: Normal rate, regular rhythm, normal heart sounds and intact distal pulses.  Pulmonary/Chest: Effort normal and breath sounds normal. No respiratory distress. He has no wheezes. Cannot respond to request to breathe deeply. Abdominal: Soft. Bowel sounds are normal. Negative fluid wave. Grimaces to palpation of RUQ without guarding.  Neurological: Somnolent, opens eyes and tracks. Only says his name, and to every question says "[phone]." Unable to test for asterixis, but twitching noted in right hand  Lab results: Basic Metabolic Panel:  Recent Labs  02/21/15 1230  NA 141  K 4.9  CL 107  CO2 25  GLUCOSE 142*  BUN 9  CREATININE 0.85  CALCIUM 8.6*   Liver Function Tests:  Recent Labs  02/21/15 1230  AST 64*  ALT 46  ALKPHOS 180*  BILITOT 1.7*  PROT 6.0*  ALBUMIN 2.8*   No results for input(s): LIPASE, AMYLASE in the last 72 hours.  Recent Labs  02/21/15 1230  AMMONIA 187*   CBC:  Recent Labs  02/21/15 1230  WBC 4.1  HGB 14.0  HCT 40.6  MCV 99.5  PLT 53*    Recent Labs  02/21/15 1230  LABPROT 15.8*  INR 1.25   Urine Drug Screen: Drugs of Abuse     Component Value Date/Time  LABOPIA NONE DETECTED 02/21/2015 1602   COCAINSCRNUR NONE DETECTED 02/21/2015 1602   LABBENZ NONE DETECTED 02/21/2015 1602   AMPHETMU NONE DETECTED 02/21/2015 1602   THCU NONE DETECTED 02/21/2015 1602   LABBARB NONE DETECTED 02/21/2015 1602    Alcohol Level:  Recent Labs  02/21/15 1230  ETH <5   Urinalysis:  Recent Labs  02/21/15 1603  COLORURINE AMBER*  LABSPEC 1.020  PHURINE  7.5  GLUCOSEU 500*  HGBUR NEGATIVE  BILIRUBINUR NEGATIVE  KETONESUR NEGATIVE  PROTEINUR NEGATIVE  UROBILINOGEN 4.0*  NITRITE NEGATIVE  LEUKOCYTESUR NEGATIVE   Ammonia: 187 (12:30 pm 8/13)  Assessment & Plan by Problem: Active Problems:   Hepatic cirrhosis   Hepatic encephalopathy   Type 2 diabetes mellitus, uncontrolled   Thrombocytopenia  Hepatic Encephalopathy 2/2 Non-Alcoholic Cirrhosis:  Likely due to week long abstinence from lactulose. We explained to Mr. Luppino wife that the rifaximin was intended to be an add-on therapy to the lactulose, not a replacement. No hematemesis, worsening ascites, or signs of SBP at this time. - Restart lactulose 30 g twice a day and assess for bowel movements - Continue Rifaximin 550 mg BID - Restart Nadolol 40 mg daily tomorrow - Discuss with pharmacy options for covering rifaximin as an outpatient - Patient education - NPO  T2DM:  Will use 50% home dose of Humulin - 40U in morning, 25U in evening  Thrombocytopenia: Likely secondary to liver cirrhosis. Near baseline of in the 67s. - CBC in AM  HLD -Hold home med Lipitor 10 mg QD, in setting of AMS. Restart when improving  dCHF -cont home med Enalapril 27m QD and Lasix 40 mg po QD  CAD - ASA - Hold Ranolazine 1000 mg BID in setting of AMS, restart when improving - Nitroglycerin 0.4 mg prn chest pain  HTN - Cont home med Enalapril 10 mg QD  DVT Prophylaxis:  SCDs only in setting thrombocytopenia  Dispo: Disposition is deferred at this time, awaiting improvement of current medical problems. Anticipated discharge in approximately 3-4 day(s).   The patient does have a current PCP (Sami HSheryle Hail MD) and [facility].  The patient does not have transportation limitations that hinder transportation to clinic appointments.  Signed: JLiberty Handy MD 02/21/2015, 6:59 PM

## 2015-02-21 NOTE — ED Notes (Signed)
Pt from home via [facility] with c/o altered mental status starting yesterday.  Pt's wife reports this happens every Saturday and is related in increase ammonia levels.  Pt not following commands, combative, and yelling.  Pt in NAD.  Pt wife denies ETOH.

## 2015-02-21 NOTE — ED Provider Notes (Signed)
CSN: [id number]     Arrival date & time 02/21/15  1019 History   First MD Initiated Contact with Patient 02/21/15 1029     Chief Complaint  Patient presents with  . Altered Mental Status     (Consider location/radiation/quality/duration/timing/severity/associated sxs/prior Treatment) Patient is a 59 y.o. male presenting with altered mental status. The history is provided by the patient, a relative and the EMS personnel. The history is limited by the condition of the patient.  Altered Mental Status Patient w hx NASH cirrhosis, noted to have increased confusion onset yesterday.  Pt confused, very limited historian - level 5 caveat. Spouse indicates pt typically does get more confused w elevated ammonia levels 'on Saturdays'.  Per report, compliant w normal meds. No reported fevers.       Past Medical History  Diagnosis Date  . Cirrhosis 2011    Cryptogenic, Likely NASH. Family/pt deny EtOH. HCV, HBV, HAV negative. ANA negative. AMA positive. Ascites 12/11  . Hyperlipemia   . Coronary artery disease     Inferior MI 12/11; LHC with occluded mid CFX and 80% proximal RCA. EF 55%. He had 3.0 x 28 vision BMS to CFX  . Diastolic CHF, acute     Echo 12/11 with ef 50-55% and mild LVH. EF 55% by LV0gram in 12/11  . Hypertension   . Type II diabetes mellitus 2011  . SVT (supraventricular tachycardia)     1/12: appeared to be an ectopic atrial tachycardia. Required DCCV with hemodynamic instability  . GI bleed     12/11: Etiology not clearly defined. EGD: nonbleeding esophageal varices.  . S/P coronary artery stent placement 06/2010  . Esophageal varices 2011, 2013    no hx acute variceal bleed  . Hepatic encephalopathy 2011, 12/2013  . Myocardial infarction ? 2012  . Shortness of breath dyspnea   . Arthritis   . Hx of echocardiogram     Echo 5/16:  EF 55-60%, no RWMA, mod LAE  . Thrombocytopenia   . Cholelithiasis   . Rectal varices    Past Surgical History  Procedure Laterality Date   . Cardiac catheterization  07/01/2010    BMS to CFX.  Marland Kitchen Appendectomy    . Orif r leg    . Esophagogastroduodenoscopy  04/13/2012    Procedure: ESOPHAGOGASTRODUODENOSCOPY (EGD);  Surgeon: Inda Castle, MD;  Location: [facility] ENDOS[facility];  Service: Endoscopy;  Laterality: N/A;  . Colonoscopy  04/13/2012    Procedure: COLONOSCOPY;  Surgeon: Inda Castle, MD;  Location: [facility] ENDOSCOPY;  Service: Endoscopy;  Laterality: N/A;  . Esophagogastroduodenoscopy N/A 01/01/2014    Procedure: ESOPHAGOGASTRODUODENOSCOPY (EGD);  Surgeon: Jerene Bears, MD;  Location: [facility] ENDOSCOPY;  Service: Endoscopy;  Laterality: N/A;  . Esophageal banding N/A 01/01/2014    Procedure: ESOPHAGEAL BANDING;  Surgeon: Jerene Bears, MD;  Location: [facility] ENDOSCOPY;  Service: Endoscopy;  Laterality: N/A;  . Coronary stent placement  06/30/2010    CFX   Distal        . Left heart catheterization with coronary angiogram N/A 10/15/2014    Procedure: LEFT HEART CATHETERIZATION WITH CORONARY ANGIOGRAM;  Surgeon: Peter M Martinique, MD;  Location: [facility] CATH LAB;  Service: Cardiovascular;  Laterality: N/A;   Family History  Problem Relation Age of Onset  . Heart attack Brother 59    MI  . Heart attack Brother 71    MI  . Heart attack Father   . Diabetes Father   . Diabetes Brother   . COPD Mother   .  Stroke Neg Hx    Social History  Substance Use Topics  . Smoking status: Current Every Day Smoker -- 1.00 packs/day for 36 years    Types: Cigarettes, E-cigarettes  . Smokeless tobacco: Current User     Comment: uses vapor cigarettes (2016 )  . Alcohol Use: No      Review of Systems  Unable to perform ROS: Mental status change  pt confused, poorly responsive - level 5 caveat.    Allergies  Review of patient's allergies indicates no known allergies.  Home Medications   Prior to Admission medications   Medication Sig Start Date End Date Taking? Authorizing Provider  aspirin 81 MG tablet Take 81 mg by mouth daily.     Historical  Provider, MD  atorvastatin (LIPITOR) 10 MG tablet Take 1 tablet (10 mg total) by mouth daily. 01/15/15   Larey Dresser, MD  enalapril (VASOTEC) 20 MG tablet Take 20 mg by mouth daily.    Historical Provider, MD  furosemide (LASIX) 40 MG tablet Take 40 mg by mouth daily.  01/09/12   Larey Dresser, MD  insulin NPH Human (HUMULIN N,NOVOLIN N) 100 UNIT/ML injection Inject 50-80 Units into the skin See admin instructions. Take 80 units in the morning and 50 units in the evening.    Historical Provider, MD  isosorbide mononitrate (IMDUR) 30 MG 24 hr tablet Take 15 mg by mouth daily. 11/14/14   Historical Provider, MD  lactulose (CHRONULAC) 10 GM/15ML solution Take 45 mLs (30 g total) by mouth 2 (two) times daily. Titrate to 3-4 bowel movements daily 02/01/15   Corky Sox, MD  metFORMIN (GLUCOPHAGE) 1000 MG tablet Take 1,000 mg by mouth 2 (two) times daily. 11/27/14   Historical Provider, MD  Multiple Vitamin (MULTIVITAMIN) capsule Take 1 capsule by mouth daily.      Historical Provider, MD  nadolol (CORGARD) 40 MG tablet Take 1 tablet (40 mg total) by mouth daily. 01/01/15   Jerene Bears, MD  nitroGLYCERIN (NITROSTAT) 0.4 MG SL tablet Place 1 tablet (0.4 mg total) under the tongue every 5 (five) minutes as needed. 01/09/12   Larey Dresser, MD  omeprazole (PRILOSEC) 20 MG capsule Take 1 capsule (20 mg total) by mouth daily. 01/15/15   Larey Dresser, MD  ranolazine (RANEXA) 500 MG 12 hr tablet Take 2 tablets (1,000 mg total) by mouth 2 (two) times daily. 01/15/15   Larey Dresser, MD  rifaximin (XIFAXAN) 550 MG TABS tablet Take 1 tablet (550 mg total) by mouth 2 (two) times daily. 02/13/15   Jerene Bears, MD  sulfamethoxazole-trimethoprim (BACTRIM DS,SEPTRA DS) 800-160 MG per tablet Take 1 tablet by mouth daily.    Historical Provider, MD   BP 166/95 mmHg  Pulse 63  Temp(Src) 97.4 F (36.3 C) (Oral)  Resp 17  SpO2 97% Physical Exam  Constitutional: He appears well-developed and well-nourished. No distress.   HENT:  Head: Atraumatic.  Mouth/Throat: Oropharynx is clear and moist.  Eyes: Conjunctivae are normal. Pupils are equal, round, and reactive to light. No scleral icterus.  Neck: Neck supple. No tracheal deviation present. No thyromegaly present.  No stiffness or rigidity  Cardiovascular: Normal rate, regular rhythm, normal heart sounds and intact distal pulses.   Pulmonary/Chest: Effort normal and breath sounds normal. No accessory muscle usage. No respiratory distress.  Abdominal: Soft. Bowel sounds are normal. He exhibits no distension and no mass. There is no tenderness. There is no rebound and no guarding.  obese  Genitourinary:  No cva tenderness  Musculoskeletal: Normal range of motion. He exhibits no tenderness.  Neurological: He is alert.  Pt drowsy, arousable, very confused. Moves bil ext purposefully w good strength.    Skin: Skin is warm and dry. He is not diaphoretic.  Nursing note and vitals reviewed.   ED Course  Procedures (including critical care time) Labs Review  Results for orders placed or performed during the hospital encounter of 02/21/15  CBC  Result Value Ref Range   WBC 4.1 4.0 - 10.5 K/uL   RBC 4.08 (L) 4.22 - 5.81 MIL/uL   Hemoglobin 14.0 13.0 - 17.0 g/dL   HCT 40.6 39.0 - 52.0 %   MCV 99.5 78.0 - 100.0 fL   MCH 34.3 (H) 26.0 - 34.0 pg   MCHC 34.5 30.0 - 36.0 g/dL   RDW 13.9 11.5 - 15.5 %   Platelets 53 (L) 150 - 400 K/uL  Comprehensive metabolic panel  Result Value Ref Range   Sodium 141 135 - 145 mmol/L   Potassium 4.9 3.5 - 5.1 mmol/L   Chloride 107 101 - 111 mmol/L   CO2 25 22 - 32 mmol/L   Glucose, Bld 142 (H) 65 - 99 mg/dL   BUN 9 6 - 20 mg/dL   Creatinine, Ser 0.85 0.61 - 1.24 mg/dL   Calcium 8.6 (L) 8.9 - 10.3 mg/dL   Total Protein 6.0 (L) 6.5 - 8.1 g/dL   Albumin 2.8 (L) 3.5 - 5.0 g/dL   AST 64 (H) 15 - 41 U/L   ALT 46 17 - 63 U/L   Alkaline Phosphatase 180 (H) 38 - 126 U/L   Total Bilirubin 1.7 (H) 0.3 - 1.2 mg/dL   GFR calc  non Af Amer >60 >60 mL/min   GFR calc Af Amer >60 >60 mL/min   Anion gap 9 5 - 15  Ethanol  Result Value Ref Range   Alcohol, Ethyl (B) <5 <5 mg/dL  Ammonia  Result Value Ref Range   Ammonia 187 (H) 9 - 35 umol/L  Protime-INR  Result Value Ref Range   Prothrombin Time 15.8 (H) 11.6 - 15.2 seconds   INR 1.25 0.00 - 1.49   Dg Chest 2 View  01/31/2015   CLINICAL DATA:  Altered mental status this morning. Initial encounter.  EXAM: CHEST  2 VIEW  COMPARISON:  PA and lateral chest 01/19/2015.  FINDINGS: Lung volumes are low but the lungs are clear. Heart size is upper normal. No pneumothorax or pleural effusion.  IMPRESSION: No acute disease.   Electronically Signed   By: Inge Rise M.D.   On: 01/31/2015 12:48   US Abdomen Limited Ruq  02/01/2015   CLINICAL DATA:  Right upper quadrant abdominal pain  EXAM: US ABDOMEN LIMITED - RIGHT UPPER QUADRANT  COMPARISON:  CT 12/29/2013  FINDINGS: Gallbladder:  Tiny dependent gallstones are noted, largest 6 mm. No gallbladder wall thickening, pericholecystic fluid, or sonographic Murphy sign identified.  Common bile duct:  Diameter: 5 mm  Liver:  Nodular hepatic contour with internal inhomogeneous echogenicity but no measurable mass or intrahepatic ductal dilatation. Trace perihepatic ascites.  IMPRESSION: Nodular hepatic contour suggesting cirrhosis, although other infiltrative disorders could appear similar.  Trace perihepatic ascites.  Gallstones without other sonographic evidence for acute cholecystitis.   Electronically Signed   By: Conchita Paris M.D.   On: 02/01/2015 10:13       MDM   Iv ns. Labs.   Reviewed nursing notes and prior charts for additional  history.   Ammonia level v high. Lactulose po.  Given marked alteration mental status, and very high ammonia level, will admit to med service.  Pt admitted tsb service within the past 3 weeks - will contact for admission.   Recheck, no change in mental state from initial. Drowsy,  arousable, confused.     Lajean Saver, MD 02/21/15 [id number]

## 2015-02-21 NOTE — ED Notes (Signed)
Stopped taking Lactulose 1 week ago-- started on another liver medicine., wife at bedside. States has been to the dr this past week and was told that the ammonia level was rising.

## 2015-02-22 DIAGNOSIS — K7200 Acute and subacute hepatic failure without coma: Secondary | ICD-10-CM | POA: Diagnosis not present

## 2015-02-22 DIAGNOSIS — K7469 Other cirrhosis of liver: Secondary | ICD-10-CM | POA: Diagnosis not present

## 2015-02-22 LAB — COMPREHENSIVE METABOLIC PANEL
ALT: 51 U/L (ref 17–63)
AST: 68 U/L — ABNORMAL HIGH (ref 15–41)
Albumin: 2.7 g/dL — ABNORMAL LOW (ref 3.5–5.0)
Alkaline Phosphatase: 98 U/L (ref 38–126)
Anion gap: 9 (ref 5–15)
BILIRUBIN TOTAL: 2.9 mg/dL — AB (ref 0.3–1.2)
BUN: 7 mg/dL (ref 6–20)
CO2: 24 mmol/L (ref 22–32)
CREATININE: 0.79 mg/dL (ref 0.61–1.24)
Calcium: 9 mg/dL (ref 8.9–10.3)
Chloride: 107 mmol/L (ref 101–111)
GFR calc Af Amer: >60 mL/min (ref >60)
GFR calc non Af Amer: >60 mL/min (ref >60)
Glucose, Bld: 125 mg/dL — ABNORMAL HIGH (ref 65–99)
Potassium: 3.8 mmol/L (ref 3.5–5.1)
SODIUM: 140 mmol/L (ref 135–145)
TOTAL PROTEIN: 6.5 g/dL (ref 6.5–8.1)

## 2015-02-22 LAB — GLUCOSE, CAPILLARY
GLUCOSE-CAPILLARY: 115 mg/dL — AB (ref 65–99)
Glucose-Capillary: 204 mg/dL — ABNORMAL HIGH (ref 65–99)

## 2015-02-22 LAB — CBC
HCT: 40.2 % (ref 39.0–52.0)
HEMOGLOBIN: 13.7 g/dL (ref 13.0–17.0)
MCH: 34.1 pg — AB (ref 26.0–34.0)
MCHC: 34.1 g/dL (ref 30.0–36.0)
MCV: 100 fL (ref 78.0–100.0)
Platelets: 53 10*3/uL — ABNORMAL LOW (ref 150–400)
RBC: 4.02 MIL/uL — AB (ref 4.22–5.81)
RDW: 14.3 % (ref 11.5–15.5)
WBC: 4.6 10*3/uL (ref 4.0–10.5)

## 2015-02-22 NOTE — Progress Notes (Signed)
Subjective:  Mental status was markedly improved today. He was cooperative, interactive, and appropriate with the team. He believe is was not taking the lactulose that led him to the hospital, and the team agreed. He did say that the rifaximin is expensive, but he said he would definitely take his lactulose as an outpatient. He had no complaints today.   Objective: Vital signs in last 24 hours: Filed Vitals:   02/21/15 2222 02/22/15 0042 02/22/15 0045 02/22/15 0559  BP: 136/86 103/52 121/60 99/47  Pulse: 60 60  63  Temp: 97.6 F (36.4 C) 99.1 F (37.3 C)  98.1 F (36.7 C)  TempSrc: Oral Oral  Oral  Resp: 20 20  20   Height: 5' 7"  (1.702 m)     Weight: 276 lb 1.6 oz (125.238 kg)   274 lb 1.6 oz (124.331 kg)  SpO2: 98% 98%  99%   Weight change:   Intake/Output Summary (Last 24 hours) at 02/22/15 1554 Last data filed at 02/22/15 0900  Gross per 24 hour  Intake 273.75 ml  Output   1300 ml  Net -1026.25 ml   Physical Exam:  General: Obese, able to sit up in bed, in no acute distress  HEENT: No scleral icterus Cardiovascular: Normal rate, regular rhythm, normal heart sounds and intact distal pulses.  Pulmonary/Chest: Effort normal and breath sounds normal. No respiratory distress. He has no wheezes. Abdominal: Soft. Bowel sounds are normal. Negative fluid wave. No abdominal tenderness Neurological: AAOx4, interactive and able to discuss medical plan. Minimal to no asterixis  Lab Results: Basic Metabolic Panel:  Recent Labs Lab 02/21/15 1230 02/22/15 0535  NA 141 140  K 4.9 3.8  CL 107 107  CO2 25 24  GLUCOSE 142* 125*  BUN 9 7  CREATININE 0.85 0.79  CALCIUM 8.6* 9.0   Liver Function Tests:  Recent Labs Lab 02/21/15 1230 02/22/15 0535  AST 64* 68*  ALT 46 51  ALKPHOS 180* 98  BILITOT 1.7* 2.9*  PROT 6.0* 6.5  ALBUMIN 2.8* 2.7*    CBC:  Recent Labs Lab 02/21/15 1230 02/22/15 0535  WBC 4.1 4.6  HGB 14.0 13.7  HCT 40.6 40.2  MCV 99.5 100.0    PLT 53* 53*   CBG:  Recent Labs Lab 02/21/15 2219 02/22/15 0556 02/22/15 1152  GLUCAP 101* 115* 204*   Medications: I have reviewed the patient's current medications. Scheduled Meds: . aspirin  81 mg Oral Daily  . enalapril  10 mg Oral Daily  . furosemide  40 mg Oral Daily  . insulin NPH Human  25 Units Subcutaneous QHS  . insulin NPH Human  40 Units Subcutaneous QAC breakfast  . lactulose  30 g Oral BID  . nadolol  40 mg Oral Daily  . ranolazine  1,000 mg Oral BID  . rifaximin  550 mg Oral BID  . sodium chloride  3 mL Intravenous Q12H  . sulfamethoxazole-trimethoprim  1 tablet Oral Daily   Continuous Infusions:  PRN Meds:.nitroGLYCERIN Assessment/Plan:  Hepatic Encephalopathy Secondary to Non-Alcoholic Cirrhosis: Mental status is dramatically improved today and he appears to be at his baseline. He had two large bowel movements overnight. This problem appears resolved for now. - Lactulose 30 g twice a day and assess for bowel movements - Rifaximin 550 mg BID - Nadolol 40 mg daily  - Restart normal diet    T2DM: CBGs remained in the 100s-low 200s. Used 40U in morning, 25U in evening of humulin as an inpatient, 50% his home dose.  Metformin was held as an inpatient.  - He will be discharged on his home regimen.  Thrombocytopenia: Due to cirrhosis. Platelets stable in the 50s  HLD: Home Lipitor 10 mg QD, in setting of AMS. - Discharge on home dose  dCHF: Discharge on home med Enalapril 75m QD and Lasix 40 mg po QD  CAD - Discharge on home ASA, Ranolazine, Nitroglycerin prn  HTN: Discharge on home Enalapril 10 mg QD  DVT Prophylaxis: SCDs only. The patient has thrombocytopenia  Dispo: Disposition is deferred at this time, awaiting improvement of current medical problems.  Anticipated discharge today.  The patient does have a current PCP (Sami HSheryle Hail MD) and [facility].  The patient does not have  transportation limitations that hinder transportation to clinic appointments.  .Services Needed at time of discharge: Y = Yes, Blank = No PT:   OT:   RN:   Equipment:   Other:       JLiberty Handy MD 02/22/2015, 3:54 PM

## 2015-02-22 NOTE — Discharge Summary (Signed)
Name: Adam Butler MRN: [id number] DOB: 1956-05-24 59 y.o. PCP: Antonietta Jewel, MD  Date of Admission: 02/21/2015 10:19 AM Date of Discharge: 02/22/2015 Attending Physician: Oval Linsey, MD  Discharge Diagnosis: Hepatic encephalopathy secondary to non-alcoholic liver cirrhosis  Discharge Medications:   Medication List    TAKE these medications        aspirin 81 MG tablet  Take 81 mg by mouth daily.     atorvastatin 10 MG tablet  Commonly known as:  LIPITOR  Take 1 tablet (10 mg total) by mouth daily.     enalapril 20 MG tablet  Commonly known as:  VASOTEC  Take 10 mg by mouth daily.     furosemide 40 MG tablet  Commonly known as:  LASIX  Take 40 mg by mouth daily.     insulin NPH Human 100 UNIT/ML injection  Commonly known as:  HUMULIN N,NOVOLIN N  Inject 50-80 Units into the skin See admin instructions. Take 80 units in the morning and 50 units in the evening.     lactulose 10 GM/15ML solution  Commonly known as:  CHRONULAC  Take 45 mLs (30 g total) by mouth 2 (two) times daily. Titrate to 3-4 bowel movements daily     metFORMIN 1000 MG tablet  Commonly known as:  GLUCOPHAGE  Take 1,000 mg by mouth 2 (two) times daily.     multivitamin capsule  Take 1 capsule by mouth daily.     nadolol 40 MG tablet  Commonly known as:  CORGARD  Take 1 tablet (40 mg total) by mouth daily.     nitroGLYCERIN 0.4 MG SL tablet  Commonly known as:  NITROSTAT  Place 1 tablet (0.4 mg total) under the tongue every 5 (five) minutes as needed.     omeprazole 20 MG capsule  Commonly known as:  PRILOSEC  Take 1 capsule (20 mg total) by mouth daily.     ranolazine 500 MG 12 hr tablet  Commonly known as:  RANEXA  Take 2 tablets (1,000 mg total) by mouth 2 (two) times daily.     rifaximin 550 MG Tabs tablet  Commonly known as:  XIFAXAN  Take 1 tablet (550 mg total) by mouth 2 (two) times daily.     sulfamethoxazole-trimethoprim 800-160 MG per tablet  Commonly known as:   BACTRIM DS,SEPTRA DS  Take 1 tablet by mouth daily.        Disposition and follow-up:   Mr.Davonta K Arteaga was discharged from [facility] in Good condition.  At the hospital follow up visit please address:  1.  Reiterate that lactulose is essential for management of hepatic encephalopathy.  2.  Address cost concerns for rifaximin.  3.  Labs / imaging needed at time of follow-up: None  4.  Pending labs/ test needing follow-up: None  Follow-up Appointments:     Follow-up Information    Follow up with [facility], MD. Schedule an appointment as soon as possible for a visit in 2 weeks.   Specialty:  Internal Medicine   Contact information:   [address] [place] [address] [phone]       Follow up with Jerene Bears, MD. Schedule an appointment as soon as possible for a visit in 1 week.   Specialty:  Gastroenterology   Contact information:   [address] [place] [address] [phone]       Discharge Instructions: Discharge Instructions    (HEART FAILURE PATIENTS) Call MD:  Anytime you have any of the following symptoms: 1) 3 pound weight gain in 24 hours or 5 pounds in 1 week 2) shortness of breath, with or without a dry hacking cough 3) swelling in the hands, feet or stomach 4) if you have to sleep on extra pillows at night in order to breathe.    Complete by:  As directed      Call MD for:  difficulty breathing, headache or visual disturbances    Complete by:  As directed      Call MD for:  extreme fatigue    Complete by:  As directed      Call MD for:  hives    Complete by:  As directed      Call MD for:  persistant dizziness or light-headedness    Complete by:  As directed      Call MD for:  persistant nausea and vomiting    Complete by:  As directed      Call MD for:  redness, tenderness, or signs of infection (pain, swelling, redness, odor or green/yellow discharge around incision site)    Complete by:  As directed       Call MD for:  severe uncontrolled pain    Complete by:  As directed      Call MD for:  temperature >100.4    Complete by:  As directed      Diet - low sodium heart healthy    Complete by:  As directed      Increase activity slowly    Complete by:  As directed            Consultations: None  Procedures Performed:  Dg Chest 2 View  01/31/2015   CLINICAL DATA:  Altered mental status this morning. Initial encounter.  EXAM: CHEST  2 VIEW  COMPARISON:  PA and lateral chest 01/19/2015.  FINDINGS: Lung volumes are low but the lungs are clear. Heart size is upper normal. No pneumothorax or pleural effusion.  IMPRESSION: No acute disease.   Electronically Signed   By: Inge Rise M.D.   On: 01/31/2015 12:48   US Abdomen Limited Ruq  02/01/2015   CLINICAL DATA:  Right upper quadrant abdominal pain  EXAM: US ABDOMEN LIMITED - RIGHT UPPER QUADRANT  COMPARISON:  CT 12/29/2013  FINDINGS: Gallbladder:  Tiny dependent gallstones are noted, largest 6 mm. No gallbladder wall thickening, pericholecystic fluid, or sonographic Murphy sign identified.  Common bile duct:  Diameter: 5 mm  Liver:  Nodular hepatic contour with internal inhomogeneous echogenicity but no measurable mass or intrahepatic ductal dilatation. Trace perihepatic ascites.  IMPRESSION: Nodular hepatic contour suggesting cirrhosis, although other infiltrative disorders could appear similar.  Trace perihepatic ascites.  Gallstones without other sonographic evidence for acute cholecystitis.   Electronically Signed   By: Conchita Paris M.D.   On: 02/01/2015 10:13    Admission HPI: Mr. Calixto is a 59 year old with a PMH of nonalcoholic cirrhosis with recurrent hepatic encephalopathy, HLD, CAD s/p stent (MI 2011), HTN, T2DM, DVT, GI bleed secondary to esophageal varices who presents with altered mental status since this morning. His wife Ladanian Kelter, was present to relay this history. When speaking to the patient, he only says his name and  "[phone]," regardless of the question. Prior to this morning, Mr. Lubitz had gone out to eat with his family and appeared to be his normal self, oriented and interactive. However, his wife Starla Link noticed that he had been sleeping longer  than usual this morning (8/13). She noticed his mental status was acutely changed, which she had seen many times before, and knew it was time to go to the hospital. Notably, the was a medication change last week with the addition of rifaximin by his gastroenterologist, Zenovia Jarred. This was intended to be an additional therapy to his regimen of lactulose 30 g twice a day, but Jeri interpreted the rifaximin as being a replacement for lactulose. Therefore, his last dose of lactulose was last Friday (8/5), and his BMs went from 4-5 per day to one. This summer, he has had multiple admissions for similar symptoms that were followed by dose escalations for lactulose. Otherwise, she says he has been vomiting meals every other day for about two weeks. She says he has not been vomiting any blood, and she has noticed any worsening abdominal distension. She also says he has had some dyspnea on exertion without chest pain. She reports that he has been smoking, has not been using street drugs, and does not drink alcohol.  Hospital Course by problem list:  Hepatic Encephalopathy Secondary to Non-Alcoholic Cirrhosis: On 6/41, the morning after his admission, there was a dramatic improvement in Mr. Molner mental status. His responses were appropriate, he was appropriately humorous, and he was interactive with the team. This was in stark contrast to the night prior, when he was only responding to questions with his name and birthday without following commands. He also reported two large bowel movements the night prior. He said he felt find to go home the day after admission. Mr. Tulloch appeared to fully understand it was was the cessation of lactulose that led to his current  hospitalization, offering the assessment himself without prompting. He said he planned on taking lactulose on discharge, but was unsure about taking the rifaximin due to the cost. He was continued on his regimen on lactulose, rifaximin, nadolol, and bactrim. No was no hematemesis, new or worsening ascites, or signs of SBP during his hospitalization  T2DM: CBGs remained in the 100s. Used 40U in morning, 25U in evening of humulin as an inpatient, 50% his home dose. Metformin was held as an inpatient. He was discharged on his home regimen.  Thrombocytopenia: Due to cirrhosis. Remained near baseline in the 50s during hospitalization  HLD:  Home Lipitor 10 mg QD, in setting of AMS. Discharged on home dose  dCHF:  Continued home med Enalapril 10m QD and Lasix 40 mg po QD  CAD - ASA maintained. Initially heldRanolazine 1000 mg BID in setting of AMS, restarted on discharge. Maintained Nitroglycerin 0.4 mg prn chest pain  HTN: Continued med Enalapril 10 mg QD  DVT Prophylaxis: SCDs only in setting thrombocytopenia  Discharge Vitals:   BP 99/47 mmHg  Pulse 63  Temp(Src) 98.1 F (36.7 C) (Oral)  Resp 20  Ht 5' 7"  (1.702 m)  Wt 274 lb 1.6 oz (124.331 kg)  BMI 42.92 kg/m2  SpO2 99%  Discharge Labs:  Results for orders placed or performed during the hospital encounter of 02/21/15 (from the past 24 hour(s))  CBC     Status: Abnormal   Collection Time: 02/21/15 12:30 PM  Result Value Ref Range   WBC 4.1 4.0 - 10.5 K/uL   RBC 4.08 (L) 4.22 - 5.81 MIL/uL   Hemoglobin 14.0 13.0 - 17.0 g/dL   HCT 40.6 39.0 - 52.0 %   MCV 99.5 78.0 - 100.0 fL   MCH 34.3 (H) 26.0 - 34.0 pg   MCHC 34.5 30.0 -  36.0 g/dL   RDW 13.9 11.5 - 15.5 %   Platelets 53 (L) 150 - 400 K/uL  Comprehensive metabolic panel     Status: Abnormal   Collection Time: 02/21/15 12:30 PM  Result Value Ref Range   Sodium 141 135 - 145 mmol/L   Potassium 4.9 3.5 - 5.1 mmol/L   Chloride 107 101 - 111 mmol/L   CO2 25 22 - 32 mmol/L     Glucose, Bld 142 (H) 65 - 99 mg/dL   BUN 9 6 - 20 mg/dL   Creatinine, Ser 0.85 0.61 - 1.24 mg/dL   Calcium 8.6 (L) 8.9 - 10.3 mg/dL   Total Protein 6.0 (L) 6.5 - 8.1 g/dL   Albumin 2.8 (L) 3.5 - 5.0 g/dL   AST 64 (H) 15 - 41 U/L   ALT 46 17 - 63 U/L   Alkaline Phosphatase 180 (H) 38 - 126 U/L   Total Bilirubin 1.7 (H) 0.3 - 1.2 mg/dL   GFR calc non Af Amer >60 >60 mL/min   GFR calc Af Amer >60 >60 mL/min   Anion gap 9 5 - 15  Ethanol     Status: None   Collection Time: 02/21/15 12:30 PM  Result Value Ref Range   Alcohol, Ethyl (B) <5 <5 mg/dL  Ammonia     Status: Abnormal   Collection Time: 02/21/15 12:30 PM  Result Value Ref Range   Ammonia 187 (H) 9 - 35 umol/L  Protime-INR     Status: Abnormal   Collection Time: 02/21/15 12:30 PM  Result Value Ref Range   Prothrombin Time 15.8 (H) 11.6 - 15.2 seconds   INR 1.25 0.00 - 1.49  Urine rapid drug screen (hosp performed)     Status: None   Collection Time: 02/21/15  4:02 PM  Result Value Ref Range   Opiates NONE DETECTED NONE DETECTED   Cocaine NONE DETECTED NONE DETECTED   Benzodiazepines NONE DETECTED NONE DETECTED   Amphetamines NONE DETECTED NONE DETECTED   Tetrahydrocannabinol NONE DETECTED NONE DETECTED   Barbiturates NONE DETECTED NONE DETECTED  Urinalysis, Routine w reflex microscopic (not at [facility])     Status: Abnormal   Collection Time: 02/21/15  4:03 PM  Result Value Ref Range   Color, Urine AMBER (A) YELLOW   APPearance CLEAR CLEAR   Specific Gravity, Urine 1.020 1.005 - 1.030   pH 7.5 5.0 - 8.0   Glucose, UA 500 (A) NEGATIVE mg/dL   Hgb urine dipstick NEGATIVE NEGATIVE   Bilirubin Urine NEGATIVE NEGATIVE   Ketones, ur NEGATIVE NEGATIVE mg/dL   Protein, ur NEGATIVE NEGATIVE mg/dL   Urobilinogen, UA 4.0 (H) 0.0 - 1.0 mg/dL   Nitrite NEGATIVE NEGATIVE   Leukocytes, UA NEGATIVE NEGATIVE  Glucose, capillary     Status: Abnormal   Collection Time: 02/21/15 10:19 PM  Result Value Ref Range   Glucose-Capillary  101 (H) 65 - 99 mg/dL  Comprehensive metabolic panel     Status: Abnormal   Collection Time: 02/22/15  5:35 AM  Result Value Ref Range   Sodium 140 135 - 145 mmol/L   Potassium 3.8 3.5 - 5.1 mmol/L   Chloride 107 101 - 111 mmol/L   CO2 24 22 - 32 mmol/L   Glucose, Bld 125 (H) 65 - 99 mg/dL   BUN 7 6 - 20 mg/dL   Creatinine, Ser 0.79 0.61 - 1.24 mg/dL   Calcium 9.0 8.9 - 10.3 mg/dL   Total Protein 6.5 6.5 - 8.1 g/dL  Albumin 2.7 (L) 3.5 - 5.0 g/dL   AST 68 (H) 15 - 41 U/L   ALT 51 17 - 63 U/L   Alkaline Phosphatase 98 38 - 126 U/L   Total Bilirubin 2.9 (H) 0.3 - 1.2 mg/dL   GFR calc non Af Amer >60 >60 mL/min   GFR calc Af Amer >60 >60 mL/min   Anion gap 9 5 - 15  CBC     Status: Abnormal   Collection Time: 02/22/15  5:35 AM  Result Value Ref Range   WBC 4.6 4.0 - 10.5 K/uL   RBC 4.02 (L) 4.22 - 5.81 MIL/uL   Hemoglobin 13.7 13.0 - 17.0 g/dL   HCT 40.2 39.0 - 52.0 %   MCV 100.0 78.0 - 100.0 fL   MCH 34.1 (H) 26.0 - 34.0 pg   MCHC 34.1 30.0 - 36.0 g/dL   RDW 14.3 11.5 - 15.5 %   Platelets 53 (L) 150 - 400 K/uL  Glucose, capillary     Status: Abnormal   Collection Time: 02/22/15  5:56 AM  Result Value Ref Range   Glucose-Capillary 115 (H) 65 - 99 mg/dL    Signed: Liberty Handy, MD 02/22/2015, 11:51 AM

## 2015-02-22 NOTE — Progress Notes (Signed)
Pt. discharge home, instructions given to wife and husband. Questions answered. Taken out via wheelchair.

## 2015-02-22 NOTE — H&P (Signed)
Internal Medicine Attending Admission Note Date: 02/22/2015  Patient name: Adam Butler Medical record number: [id number] Date of birth: 1955/08/29 Age: 60 y.o. Gender: male  I saw and evaluated the patient. I reviewed the resident's note and I agree with the resident's findings and plan as documented in the resident's note.  Chief Complaint(s): Increased confusion 1 day.  History - key components related to admission:  Adam Butler is a 59 year old man with history of cirrhosis secondary to nonalcoholic steatohepatitis complicated by hepatic encephalopathy and portal hypertension, coronary artery disease, diabetes, hypertension, and hyperlipidemia who presents with the acute onset of increased confusion. He was in his usual state of health on the day prior to admission when he went out with his wife for dinner. He subsequently developed increased confusion. As she is well aware of the signs and symptoms of hepatic encephalopathy in her husband and brought him to the emergency room for evaluation. It should be noted that last week rifaximin was started and intended to be additional therapy to his lactulose. Unfortunately, this was misinterpreted as being replacement therapy for the lactulose and he has not been on any lactulose for the last week. He has had some nausea and vomiting over this time but his stool output had dropped from 4-5 stools a day to just 1 stool a day. There have been no fevers, shakes, chills, chest pain, or shortness of breath. He was admitted to the internal medicine teaching service for further evaluation and care.  When seen on rounds this morning his mental status was back to baseline and he was able to tell us the exact reason why he developed the episode of hepatic encephalopathy.  Physical Exam - key components related to admission:  Filed Vitals:   02/21/15 2222 02/22/15 0042 02/22/15 0045 02/22/15 0559  BP: 136/86 103/52 121/60 99/47  Pulse: 60 60  63  Temp:  97.6 F (36.4 C) 99.1 F (37.3 C)  98.1 F (36.7 C)  TempSrc: Oral Oral  Oral  Resp: 20 20  20   Height: 5' 7"  (1.702 m)     Weight: 276 lb 1.6 oz (125.238 kg)   274 lb 1.6 oz (124.331 kg)  SpO2: 98% 98%  99%   Gen.: Well-developed, well nourished, obese man lying comfortably in bed in no acute distress. He is alert and oriented 3 and able to carry on detailed conversation. Abdomen: Soft, nontender, active bowel sounds. Neuro: No asterixis.  Lab results:  Basic Metabolic Panel:  Recent Labs  02/21/15 1230 02/22/15 0535  NA 141 140  K 4.9 3.8  CL 107 107  CO2 25 24  GLUCOSE 142* 125*  BUN 9 7  CREATININE 0.85 0.79  CALCIUM 8.6* 9.0   Liver Function Tests:  Recent Labs  02/21/15 1230 02/22/15 0535  AST 64* 68*  ALT 46 51  ALKPHOS 180* 98  BILITOT 1.7* 2.9*  PROT 6.0* 6.5  ALBUMIN 2.8* 2.7*    Recent Labs  02/21/15 1230  AMMONIA 187*   CBC:  Recent Labs  02/21/15 1230 02/22/15 0535  WBC 4.1 4.6  HGB 14.0 13.7  HCT 40.6 40.2  MCV 99.5 100.0  PLT 53* 53*   CBG:  Recent Labs  02/21/15 2219 02/22/15 0556 02/22/15 1152  GLUCAP 101* 115* 204*   Coagulation:  Recent Labs  02/21/15 1230  INR 1.25   Urine Drug Screen:  Unremarkable  Alcohol Level:  Recent Labs  02/21/15 [place] <5   Urinalysis:  Clear, specific gravity  1.020, pH 7.5, glucose 500, urobilinogen 4, negative nitrite, negative leukocytes.  Assessment & Plan by Problem:  Adam Butler is a 59 year old man with a history of cirrhosis secondary to nonalcoholic steatohepatitis, coronary artery disease, type 2 diabetes, hypertension, and hyperlipidemia who presented with acute hepatic encephalopathy. The cause of this was confusion over his medical regimen. He and his wife were under the impression that the rifaximin was to replace the lactulose. Therefore he has not received lactulose for just over one week resulting in a stool output from 4-5 stools a day to once a day.  This resulted in his acute hepatic encephalopathy. With the reinstitution of lactulose therapy and 2 large bowel movements his mental status has returned to baseline and he has no asterixis on exam this morning.  1) Hepatic encephalopathy: Adam Butler was educated on the need to continue the lactulose and take the rifaximin if it can be afforded.  2) Disposition: Now that he is at baseline from a mental status standpoint, and has a better understanding of the need to take both the lactulose and rifaximin, he is stable for discharge home with follow-up in his primary care provider's office as well as his gastroenterologist's office.

## 2015-02-22 NOTE — Discharge Instructions (Signed)
Adam Butler, it was a pleasure to meet you although I am sorry about the circumstances. You were admitted for hepatic encephalopathy due to liver cirrhosis. As we discussed, this was likely caused by not taking the lactulose for the past week. The lactulose is very, very important for preventing these episodes We encourage you to take both the lactulose and rifaximin, but we understand that cost is a concern for rifaximin. You should be having 3-5 bowel movements a day to help clear the toxins that your liver cannot break down (see below).  If you or your family notices these episodes again, where you become confused and tired, please seek medication attention.   Hepatic Encephalopathy Hepatic encephalopathy is a syndrome. This is a set of symptoms that occur together. It is seen mostly in patients with damage to the liver known as cirrhosis. This is where normal liver tissue has been replaced by scar tissue.  Symptoms of the syndrome include:  Changes in personality.  Mental impairment.  A depressed level of consciousness. These changes occur because toxins build up in the bloodstream. The build up occurs because the scarred liver cannot rid toxins from the body. The most important of these toxins is ammonia. Toxins can cause abnormal behavior and confusion. Toxins in the blood stream can impair your ability to take care of yourself or others. Some people become very sleepy and cannot be woken easily. In severe cases, the patient lapses into a coma.  CAUSES  There are many things that can cause liver damage that can lead to buildup of toxins. These include:  Diseases that cause cirrhosis of the liver.  Long-term alcohol use with progressive liver damage.  Hepatitis B or C with ongoing infection and liver damage.  Patients without cirrhosis who have undergone shunt surgery.  Kidney failure.  Bleeding in the stomach or intestines.  Infection.  Constipation.  Medications that act  upon the central nervous system.  Diuretic therapy.  Excessive dietary protein. SYMPTOMS  Symptoms of this syndrome are categorized or "staged" based on severity.   Stage 0. Minimal hepatic encephalopathy. No detectable changes in personality or behavior. Minimal changes in memory, concentration, mental function, and physical ability.  Stage 1. Some lack of awareness. Shortened attention span. Problems with addition or subtraction. Possible problems with sleeping or a reversal of the normal sleep pattern. Euphoria, depression, or irritability may be present. Mild confusion. Slowing of mental ability. Tremors may be detected.  Stage 2. Lethargy or apathy. Disoriented. Strange behavior. Slurred speech. Obvious tremors. Drowsiness, unable to perform mental tasks. Personality changes, and confusion about time.  Stage 3. Very sleepy but can be aroused. Unable to perform mental tasks, cannot keep track of time and place, marked confusion, amnesia, occasional fits of rage, speech cannot be understood.  Stage 4. Coma with or without response to painful stimuli. DIAGNOSIS  In mild cases, a careful history and physical exam may lead your caregiver to consider possible mild hepatic encephalopathy as the cause of symptoms. The diagnosis is clearer in more severe cases. An elevated blood ammonia level is the classic blood test abnormality in patients with this syndrome. Other tests can be helpful to rule out other diseases.  TREATMENT   Medications are often used to lower the ammonia level in the blood. This usually leads to improvement.  Diets containing vegetable proteins are better than diets rich in animal protein, especially proteins derived from red meats. Eating well-cooked chicken and fish in addition to vegetable protein should be  discussed with your caregiver. Malnourished patients are encouraged to add liquid nutritional supplements to their diet.  Antibiotics are sometimes used to try to  lessen the volume of bacteria in the intestines that produce ammonia.  Moderate to severe cases of this syndrome usually require a hospital stay and medicine that is given directly into a vein (intravenously). HOME CARE INSTRUCTIONS  The goal at home is to avoid things that can make the condition worse and lead to a buildup of ammonia in the blood.  Eat a well balanced diet. Your caregiver can help you with suggestions on this.  Talk to your caregiver before taking vitamin supplements. Large doses of vitamins and minerals, especially vitamin A, iron, or copper, can worsen liver damage.  A low salt diet, water restriction, or diuretic medicine may be needed to reduce fluid retention.  Avoid alcohol and acetaminophen as well as any over-the-counter medications that contain acetaminophen (check labels). Only take over-the-counter or prescription medicines for pain, discomfort, or fever as directed by your caregiver.  Avoid drugs that are toxic to the liver. Review your medications (both prescription and non-prescription) with your caregiver to make sure those you are taking will not be harmful.  Blood tests may be needed. Follow your caregiver's advice regarding the timing of these.  With this condition you play a critical role in maintaining your own good health. The failure to follow your caregiver's advice and these instructions may result in permanent disability or death. SEEK MEDICAL CARE IF:   You have increasing fatigue or weakness.  You develop increasing swelling of the abdomen, hands, feet, legs or face.  You develop loss of appetite.  You are feeling sick to your stomach (nausea) and vomiting.  You develop jaundice. This is a yellow discoloration of the skin.  You develop worsening problems with concentration, confusion, and/or problems with sleep. SEEK IMMEDIATE MEDICAL CARE IF:   You vomit bright red blood or a coffee ground-looking material.  You have blood in your  stools. Or the stools turn black and tarry.  You have a fever.  You develop easy bruising or bleeding.  You have a return of slurred speech, change in behavior, or confusion. MAKE SURE YOU:   Understand these instructions.  Will watch your condition.  Will get help right away if you are not doing well or get worse. Document Released: 09/06/2006 Document Revised: 09/19/2011 Document Reviewed: 06/13/2007 [facility] Patient Information 2015 [place], [place]. This information is not intended to replace advice given to you by your health care provider. Make sure you discuss any questions you have with your health care provider.

## 2015-03-02 ENCOUNTER — Telehealth: Payer: Self-pay | Admitting: Internal Medicine

## 2015-03-02 MED ORDER — RIFAXIMIN 550 MG PO TABS: 550.0000 mg | ORAL_TABLET | Freq: Two times a day (BID) | ORAL | Status: DC

## 2015-03-02 NOTE — Telephone Encounter (Signed)
Wife states pt is out of xifaxan samples. More samples left up front for pt to pick up. Pts wife also given information regarding pt assistance information on xifaxan she states she will fill this out.

## 2015-03-12 ENCOUNTER — Ambulatory Visit (INDEPENDENT_AMBULATORY_CARE_PROVIDER_SITE_OTHER): Payer: PPO | Admitting: Gastroenterology

## 2015-03-12 ENCOUNTER — Encounter: Payer: Self-pay | Admitting: Gastroenterology

## 2015-03-12 VITALS — BP 100/66 | HR 84 | Ht 67.0 in | Wt 282.0 lb

## 2015-03-12 DIAGNOSIS — K7469 Other cirrhosis of liver: Secondary | ICD-10-CM | POA: Diagnosis not present

## 2015-03-12 DIAGNOSIS — K7290 Hepatic failure, unspecified without coma: Secondary | ICD-10-CM | POA: Diagnosis not present

## 2015-03-12 DIAGNOSIS — K7682 Hepatic encephalopathy: Secondary | ICD-10-CM

## 2015-03-12 DIAGNOSIS — K7030 Alcoholic cirrhosis of liver without ascites: Secondary | ICD-10-CM | POA: Insufficient documentation

## 2015-03-12 NOTE — Progress Notes (Addendum)
03/12/2015 SAVON BORDONARO [id number] March 09, 1956   History of Present Illness:  This is a 59 year old male known to Dr. Hilarie Fredrickson with a past medical history of cirrhosis felt secondary to NASH, complicated by portal hypertension with history of small esophageal varices, rectal varices, ascites, thrombocytopenia, encephalopathy.  He also has a history of coronary artery disease, hyperlipidemia, diastolic heart failure, hypertension, diabetes, and gallstones.  He is being seen here today for hospital follow-up after a one day stay in mid-August for confusion and elevated ammonia level obviously related to hepatic encephalopathy. At his last visit Dr. Hilarie Fredrickson had placed him on Xifaxan 550 mg twice daily and he was told that he could discontinue his lactulose. They have been having issues trying to get Xifaxan approved and are not able to afford it due to cost, but we have been supplying him with samples. While he was on the Xifaxan twice daily and no lactulose he developed acute lethargy and confusion. Was taken by EMS to the hospital and was found have elevated ammonia level at 187. He was discharged the next day after receiving lactulose and had recovery of his baseline mental status. He is here today for follow-up of that. Currently he is taking the Xifaxan 550 mg twice daily and taking lactulose once daily as well. They're working on trying to get assistance for [employer].  He has been doing well since that hospital discharge.  CBC, CMP, and PT/INR were stable at the time of his hospital stay.  [place] screening--Ultrasound from April 2016 no liver lesions, repeat was due October 2016, but ultrasound was actually ordered by different MD in 01/2015, also with no sign of [place].   EGD -- 01/01/2014 -- small esophageal varices in the middle and distal third of the esophagus which completely flatten with insufflation. Normal stomach. Normal duodenum. Colonoscopy 04/13/2012 -- portable hypertensive colopathy,  rectal varices and some friable mucosa. Most noticeable in the right colon. No polyps were seen.   Current Medications, Allergies, Past Medical History, Past Surgical History, Family History and Social History were reviewed in [employer] record.   Physical Exam: BP 100/66 mmHg  Pulse 84  Ht 5' 7"  (1.702 m)  Wt 282 lb (127.914 kg)  BMI 44.16 kg/m2 General: Well developed white male in no acute distress Head: Normocephalic and atraumatic Eyes:  sclerae anicteric, conjunctiva pink  Ears: Normal auditory acuity Lungs: Clear throughout to auscultation Heart: Regular rate and rhythm Abdomen: Soft, obese, non-distended.  BS present.  Non-tender. Musculoskeletal: Symmetrical with no gross deformities  Extremities: Trace B/L LE edema noted. Neurological: Alert oriented x 4, grossly non-focal.  No asterixis. Psychological:  Alert and cooperative. Normal mood and affect  Assessment and Recommendations: 59 yo male with a past medical history of cirrhosis felt secondary to NASH, complicated by portal hypertension with history of small esophageal varices, rectal varices, ascites, thrombocytopenia, history of encephalopathy who is seen for hospital follow-up.   1. NASH cirrhosis with portal HTN -- recently hospitalized for HE and elevated ammonia while on Xifaxan alone.  Lactulose has been added back to his regimen and he will continue that titrated to 3-4 soft stools per day.  They are unsure if he will be able to continue Xifaxan anyway due to cost (waiting to hear if they qualify for assistance). --[place] screening -- ultrasound from April 2016 no liver lesions, repeat was due October 2016, but ultrasound was actually ordered by different MD in 01/2015, also with no  sign of [place].  Will be due again in 07/2015. --Variceal screening -- up-to-date, last endoscopy 2015 -- small varices. Continue Nadolol 40 mg daily.  2. CRC screening -- no polyps as of October 2013, repeat  2023  *Follow-up with Dr. Hilarie Fredrickson in approximately 8 weeks.   Addendum: Reviewed and agree with management. Jerene Bears, MD

## 2015-03-12 NOTE — Patient Instructions (Signed)
Your follow up appointment with Dr Hilarie Fredrickson is on 05/19/2015 at 11am We have cancelled your Ultrasound for October

## 2015-03-18 ENCOUNTER — Other Ambulatory Visit: Payer: PPO

## 2015-03-19 ENCOUNTER — Other Ambulatory Visit (INDEPENDENT_AMBULATORY_CARE_PROVIDER_SITE_OTHER): Payer: PPO

## 2015-03-19 DIAGNOSIS — E785 Hyperlipidemia, unspecified: Secondary | ICD-10-CM | POA: Diagnosis not present

## 2015-03-19 LAB — HEPATIC FUNCTION PANEL
ALT: 42 U/L (ref 0–53)
AST: 58 U/L — ABNORMAL HIGH (ref 0–37)
Albumin: 2.9 g/dL — ABNORMAL LOW (ref 3.5–5.2)
Alkaline Phosphatase: 148 U/L — ABNORMAL HIGH (ref 39–117)
BILIRUBIN DIRECT: 0.3 mg/dL (ref 0.0–0.3)
BILIRUBIN TOTAL: 1.5 mg/dL — AB (ref 0.2–1.2)
Total Protein: 6.4 g/dL (ref 6.0–8.3)

## 2015-03-20 ENCOUNTER — Telehealth: Payer: Self-pay

## 2015-03-20 ENCOUNTER — Telehealth: Payer: Self-pay | Admitting: Cardiology

## 2015-03-20 NOTE — Telephone Encounter (Signed)
New message ° ° ° ° °Returning a nurses call to get lab results °

## 2015-03-20 NOTE — Telephone Encounter (Signed)
-----   Message from Larey Dresser, MD sent at 03/19/2015  9:50 PM EDT ----- LFTs lower

## 2015-03-20 NOTE — Telephone Encounter (Signed)
Pt given lab results done 03/19/15

## 2015-03-20 NOTE — Telephone Encounter (Signed)
SPOKE WITH PT ABOUT RECENT LAB RESULTS. PT VERBALIZED UNDERSTANDING.

## 2015-04-13 ENCOUNTER — Ambulatory Visit (HOSPITAL_COMMUNITY): Payer: PPO

## 2015-04-13 ENCOUNTER — Encounter (HOSPITAL_COMMUNITY): Payer: Self-pay

## 2015-04-13 ENCOUNTER — Inpatient Hospital Stay (HOSPITAL_COMMUNITY)
Admission: EM | Admit: 2015-04-13 | Discharge: 2015-04-20 | DRG: 871 | Disposition: A | Discharge status: Home Health | Payer: PPO | Attending: Internal Medicine | Admitting: Internal Medicine

## 2015-04-13 DIAGNOSIS — E872 Acidosis: Secondary | ICD-10-CM | POA: Diagnosis present

## 2015-04-13 DIAGNOSIS — N200 Calculus of kidney: Secondary | ICD-10-CM | POA: Diagnosis present

## 2015-04-13 DIAGNOSIS — D72819 Decreased white blood cell count, unspecified: Secondary | ICD-10-CM | POA: Diagnosis present

## 2015-04-13 DIAGNOSIS — R0602 Shortness of breath: Secondary | ICD-10-CM

## 2015-04-13 DIAGNOSIS — Z888 Allergy status to other drugs, medicaments and biological substances status: Secondary | ICD-10-CM

## 2015-04-13 DIAGNOSIS — E1165 Type 2 diabetes mellitus with hyperglycemia: Secondary | ICD-10-CM | POA: Diagnosis present

## 2015-04-13 DIAGNOSIS — A409 Streptococcal sepsis, unspecified: Secondary | ICD-10-CM | POA: Diagnosis not present

## 2015-04-13 DIAGNOSIS — R109 Unspecified abdominal pain: Secondary | ICD-10-CM

## 2015-04-13 DIAGNOSIS — K766 Portal hypertension: Secondary | ICD-10-CM | POA: Diagnosis present

## 2015-04-13 DIAGNOSIS — R7881 Bacteremia: Secondary | ICD-10-CM

## 2015-04-13 DIAGNOSIS — Q2112 Patent foramen ovale: Secondary | ICD-10-CM

## 2015-04-13 DIAGNOSIS — N179 Acute kidney failure, unspecified: Secondary | ICD-10-CM | POA: Diagnosis present

## 2015-04-13 DIAGNOSIS — A419 Sepsis, unspecified organism: Secondary | ICD-10-CM | POA: Diagnosis present

## 2015-04-13 DIAGNOSIS — Q211 Atrial septal defect: Secondary | ICD-10-CM

## 2015-04-13 DIAGNOSIS — Z79899 Other long term (current) drug therapy: Secondary | ICD-10-CM

## 2015-04-13 DIAGNOSIS — K089 Disorder of teeth and supporting structures, unspecified: Secondary | ICD-10-CM | POA: Insufficient documentation

## 2015-04-13 DIAGNOSIS — R6520 Severe sepsis without septic shock: Secondary | ICD-10-CM

## 2015-04-13 DIAGNOSIS — Z792 Long term (current) use of antibiotics: Secondary | ICD-10-CM

## 2015-04-13 DIAGNOSIS — K7200 Acute and subacute hepatic failure without coma: Secondary | ICD-10-CM

## 2015-04-13 DIAGNOSIS — G9340 Encephalopathy, unspecified: Secondary | ICD-10-CM | POA: Diagnosis present

## 2015-04-13 DIAGNOSIS — E871 Hypo-osmolality and hyponatremia: Secondary | ICD-10-CM | POA: Diagnosis present

## 2015-04-13 DIAGNOSIS — Z7984 Long term (current) use of oral hypoglycemic drugs: Secondary | ICD-10-CM

## 2015-04-13 DIAGNOSIS — I25119 Atherosclerotic heart disease of native coronary artery with unspecified angina pectoris: Secondary | ICD-10-CM | POA: Diagnosis present

## 2015-04-13 DIAGNOSIS — I5032 Chronic diastolic (congestive) heart failure: Secondary | ICD-10-CM | POA: Diagnosis present

## 2015-04-13 DIAGNOSIS — Z7982 Long term (current) use of aspirin: Secondary | ICD-10-CM

## 2015-04-13 DIAGNOSIS — Z794 Long term (current) use of insulin: Secondary | ICD-10-CM

## 2015-04-13 DIAGNOSIS — J189 Pneumonia, unspecified organism: Secondary | ICD-10-CM | POA: Diagnosis present

## 2015-04-13 DIAGNOSIS — N390 Urinary tract infection, site not specified: Secondary | ICD-10-CM | POA: Diagnosis present

## 2015-04-13 DIAGNOSIS — D696 Thrombocytopenia, unspecified: Secondary | ICD-10-CM | POA: Diagnosis present

## 2015-04-13 DIAGNOSIS — K7290 Hepatic failure, unspecified without coma: Secondary | ICD-10-CM | POA: Diagnosis present

## 2015-04-13 DIAGNOSIS — F17210 Nicotine dependence, cigarettes, uncomplicated: Secondary | ICD-10-CM | POA: Diagnosis present

## 2015-04-13 DIAGNOSIS — I8500 Esophageal varices without bleeding: Secondary | ICD-10-CM | POA: Diagnosis present

## 2015-04-13 DIAGNOSIS — Z8249 Family history of ischemic heart disease and other diseases of the circulatory system: Secondary | ICD-10-CM

## 2015-04-13 DIAGNOSIS — I110 Hypertensive heart disease with heart failure: Secondary | ICD-10-CM | POA: Diagnosis present

## 2015-04-13 DIAGNOSIS — R188 Other ascites: Secondary | ICD-10-CM

## 2015-04-13 DIAGNOSIS — D649 Anemia, unspecified: Secondary | ICD-10-CM | POA: Diagnosis present

## 2015-04-13 DIAGNOSIS — E119 Type 2 diabetes mellitus without complications: Secondary | ICD-10-CM | POA: Insufficient documentation

## 2015-04-13 DIAGNOSIS — IMO0002 Reserved for concepts with insufficient information to code with codable children: Secondary | ICD-10-CM | POA: Diagnosis present

## 2015-04-13 DIAGNOSIS — E785 Hyperlipidemia, unspecified: Secondary | ICD-10-CM | POA: Diagnosis present

## 2015-04-13 DIAGNOSIS — K7682 Hepatic encephalopathy: Secondary | ICD-10-CM

## 2015-04-13 DIAGNOSIS — K7581 Nonalcoholic steatohepatitis (NASH): Secondary | ICD-10-CM | POA: Diagnosis present

## 2015-04-13 DIAGNOSIS — Z955 Presence of coronary angioplasty implant and graft: Secondary | ICD-10-CM

## 2015-04-13 DIAGNOSIS — K029 Dental caries, unspecified: Secondary | ICD-10-CM | POA: Diagnosis present

## 2015-04-13 DIAGNOSIS — Y95 Nosocomial condition: Secondary | ICD-10-CM | POA: Diagnosis present

## 2015-04-13 DIAGNOSIS — K7460 Unspecified cirrhosis of liver: Secondary | ICD-10-CM | POA: Diagnosis present

## 2015-04-13 DIAGNOSIS — I252 Old myocardial infarction: Secondary | ICD-10-CM

## 2015-04-13 DIAGNOSIS — R6521 Severe sepsis with septic shock: Secondary | ICD-10-CM | POA: Diagnosis present

## 2015-04-13 HISTORY — DX: Atrial septal defect: Q21.1

## 2015-04-13 NOTE — ED Notes (Signed)
Pt has AMS, started around dinner time per family, which was around 80, pt has hx of high ammonia levels and diabetes, CBG with EMS was 361, last time pt was altered ammonia levels were high. Pt has fruity odor to breath.

## 2015-04-14 ENCOUNTER — Encounter (HOSPITAL_COMMUNITY): Payer: Self-pay | Admitting: Radiology

## 2015-04-14 ENCOUNTER — Emergency Department (HOSPITAL_COMMUNITY): Payer: PPO

## 2015-04-14 DIAGNOSIS — K7682 Hepatic encephalopathy: Secondary | ICD-10-CM | POA: Insufficient documentation

## 2015-04-14 DIAGNOSIS — K7200 Acute and subacute hepatic failure without coma: Secondary | ICD-10-CM

## 2015-04-14 DIAGNOSIS — K7469 Other cirrhosis of liver: Secondary | ICD-10-CM | POA: Diagnosis not present

## 2015-04-14 DIAGNOSIS — I8500 Esophageal varices without bleeding: Secondary | ICD-10-CM | POA: Diagnosis present

## 2015-04-14 DIAGNOSIS — K766 Portal hypertension: Secondary | ICD-10-CM | POA: Diagnosis present

## 2015-04-14 DIAGNOSIS — Z7984 Long term (current) use of oral hypoglycemic drugs: Secondary | ICD-10-CM | POA: Diagnosis not present

## 2015-04-14 DIAGNOSIS — J189 Pneumonia, unspecified organism: Secondary | ICD-10-CM

## 2015-04-14 DIAGNOSIS — I5032 Chronic diastolic (congestive) heart failure: Secondary | ICD-10-CM | POA: Diagnosis present

## 2015-04-14 DIAGNOSIS — E872 Acidosis: Secondary | ICD-10-CM | POA: Diagnosis present

## 2015-04-14 DIAGNOSIS — K0889 Other specified disorders of teeth and supporting structures: Secondary | ICD-10-CM | POA: Diagnosis not present

## 2015-04-14 DIAGNOSIS — K7290 Hepatic failure, unspecified without coma: Secondary | ICD-10-CM | POA: Diagnosis not present

## 2015-04-14 DIAGNOSIS — R188 Other ascites: Secondary | ICD-10-CM | POA: Insufficient documentation

## 2015-04-14 DIAGNOSIS — Z792 Long term (current) use of antibiotics: Secondary | ICD-10-CM | POA: Diagnosis not present

## 2015-04-14 DIAGNOSIS — R6521 Severe sepsis with septic shock: Secondary | ICD-10-CM

## 2015-04-14 DIAGNOSIS — E785 Hyperlipidemia, unspecified: Secondary | ICD-10-CM | POA: Diagnosis present

## 2015-04-14 DIAGNOSIS — Z955 Presence of coronary angioplasty implant and graft: Secondary | ICD-10-CM | POA: Diagnosis not present

## 2015-04-14 DIAGNOSIS — N179 Acute kidney failure, unspecified: Secondary | ICD-10-CM | POA: Diagnosis present

## 2015-04-14 DIAGNOSIS — D696 Thrombocytopenia, unspecified: Secondary | ICD-10-CM | POA: Diagnosis present

## 2015-04-14 DIAGNOSIS — I110 Hypertensive heart disease with heart failure: Secondary | ICD-10-CM | POA: Diagnosis present

## 2015-04-14 DIAGNOSIS — Q211 Atrial septal defect: Secondary | ICD-10-CM | POA: Diagnosis not present

## 2015-04-14 DIAGNOSIS — Y95 Nosocomial condition: Secondary | ICD-10-CM | POA: Diagnosis present

## 2015-04-14 DIAGNOSIS — Z888 Allergy status to other drugs, medicaments and biological substances status: Secondary | ICD-10-CM | POA: Diagnosis not present

## 2015-04-14 DIAGNOSIS — E1165 Type 2 diabetes mellitus with hyperglycemia: Secondary | ICD-10-CM | POA: Diagnosis present

## 2015-04-14 DIAGNOSIS — A419 Sepsis, unspecified organism: Secondary | ICD-10-CM | POA: Diagnosis not present

## 2015-04-14 DIAGNOSIS — Z8249 Family history of ischemic heart disease and other diseases of the circulatory system: Secondary | ICD-10-CM | POA: Diagnosis not present

## 2015-04-14 DIAGNOSIS — K7460 Unspecified cirrhosis of liver: Secondary | ICD-10-CM | POA: Diagnosis present

## 2015-04-14 DIAGNOSIS — D72819 Decreased white blood cell count, unspecified: Secondary | ICD-10-CM | POA: Diagnosis present

## 2015-04-14 DIAGNOSIS — N200 Calculus of kidney: Secondary | ICD-10-CM | POA: Diagnosis present

## 2015-04-14 DIAGNOSIS — K029 Dental caries, unspecified: Secondary | ICD-10-CM | POA: Diagnosis present

## 2015-04-14 DIAGNOSIS — R7881 Bacteremia: Secondary | ICD-10-CM | POA: Diagnosis not present

## 2015-04-14 DIAGNOSIS — B954 Other streptococcus as the cause of diseases classified elsewhere: Secondary | ICD-10-CM | POA: Diagnosis not present

## 2015-04-14 DIAGNOSIS — Z794 Long term (current) use of insulin: Secondary | ICD-10-CM | POA: Diagnosis not present

## 2015-04-14 DIAGNOSIS — A409 Streptococcal sepsis, unspecified: Secondary | ICD-10-CM | POA: Diagnosis present

## 2015-04-14 DIAGNOSIS — N390 Urinary tract infection, site not specified: Secondary | ICD-10-CM | POA: Diagnosis present

## 2015-04-14 DIAGNOSIS — I25119 Atherosclerotic heart disease of native coronary artery with unspecified angina pectoris: Secondary | ICD-10-CM | POA: Diagnosis present

## 2015-04-14 DIAGNOSIS — D649 Anemia, unspecified: Secondary | ICD-10-CM | POA: Diagnosis present

## 2015-04-14 DIAGNOSIS — I252 Old myocardial infarction: Secondary | ICD-10-CM | POA: Diagnosis not present

## 2015-04-14 DIAGNOSIS — F17210 Nicotine dependence, cigarettes, uncomplicated: Secondary | ICD-10-CM | POA: Diagnosis present

## 2015-04-14 DIAGNOSIS — R0602 Shortness of breath: Secondary | ICD-10-CM | POA: Diagnosis present

## 2015-04-14 DIAGNOSIS — I288 Other diseases of pulmonary vessels: Secondary | ICD-10-CM | POA: Diagnosis not present

## 2015-04-14 DIAGNOSIS — Z7982 Long term (current) use of aspirin: Secondary | ICD-10-CM | POA: Diagnosis not present

## 2015-04-14 DIAGNOSIS — Z79899 Other long term (current) drug therapy: Secondary | ICD-10-CM | POA: Diagnosis not present

## 2015-04-14 DIAGNOSIS — G9340 Encephalopathy, unspecified: Secondary | ICD-10-CM | POA: Diagnosis present

## 2015-04-14 DIAGNOSIS — I339 Acute and subacute endocarditis, unspecified: Secondary | ICD-10-CM | POA: Diagnosis not present

## 2015-04-14 DIAGNOSIS — E871 Hypo-osmolality and hyponatremia: Secondary | ICD-10-CM | POA: Diagnosis present

## 2015-04-14 DIAGNOSIS — K7581 Nonalcoholic steatohepatitis (NASH): Secondary | ICD-10-CM | POA: Diagnosis present

## 2015-04-14 LAB — COMPREHENSIVE METABOLIC PANEL
ALT: 44 U/L (ref 17–63)
AST: 67 U/L — ABNORMAL HIGH (ref 15–41)
Albumin: 3 g/dL — ABNORMAL LOW (ref 3.5–5.0)
Alkaline Phosphatase: 178 U/L — ABNORMAL HIGH (ref 38–126)
Anion gap: 10 (ref 5–15)
BUN: 9 mg/dL (ref 6–20)
CHLORIDE: 100 mmol/L — AB (ref 101–111)
CO2: 22 mmol/L (ref 22–32)
CREATININE: 1.05 mg/dL (ref 0.61–1.24)
Calcium: 9.2 mg/dL (ref 8.9–10.3)
GFR calc non Af Amer: >60 mL/min (ref >60)
Glucose, Bld: 295 mg/dL — ABNORMAL HIGH (ref 65–99)
Potassium: 4.2 mmol/L (ref 3.5–5.1)
SODIUM: 132 mmol/L — AB (ref 135–145)
Total Bilirubin: 2.3 mg/dL — ABNORMAL HIGH (ref 0.3–1.2)
Total Protein: 6.8 g/dL (ref 6.5–8.1)

## 2015-04-14 LAB — CBC
HCT: 40.1 % (ref 39.0–52.0)
HEMOGLOBIN: 13.8 g/dL (ref 13.0–17.0)
MCH: 34.4 pg — AB (ref 26.0–34.0)
MCHC: 34.4 g/dL (ref 30.0–36.0)
MCV: 100 fL (ref 78.0–100.0)
PLATELETS: 61 10*3/uL — AB (ref 150–400)
RBC: 4.01 MIL/uL — AB (ref 4.22–5.81)
RDW: 13.6 % (ref 11.5–15.5)
WBC: 9.6 10*3/uL (ref 4.0–10.5)

## 2015-04-14 LAB — URINALYSIS, ROUTINE W REFLEX MICROSCOPIC
BILIRUBIN URINE: NEGATIVE
Glucose, UA: >1000 mg/dL — AB
Ketones, ur: 15 mg/dL — AB
Leukocytes, UA: NEGATIVE
NITRITE: NEGATIVE
PH: 6 (ref 5.0–8.0)
Protein, ur: NEGATIVE mg/dL
SPECIFIC GRAVITY, URINE: 1.044 — AB (ref 1.005–1.030)
Urobilinogen, UA: 1 mg/dL (ref 0.0–1.0)

## 2015-04-14 LAB — URINE MICROSCOPIC-ADD ON

## 2015-04-14 LAB — LACTIC ACID, PLASMA
LACTIC ACID, VENOUS: 1.5 mmol/L (ref 0.5–2.0)
Lactic Acid, Venous: 2.3 mmol/L (ref 0.5–2.0)
Lactic Acid, Venous: 2.5 mmol/L (ref 0.5–2.0)

## 2015-04-14 LAB — I-STAT CG4 LACTIC ACID, ED
Lactic Acid, Venous: 4.12 mmol/L (ref 0.5–2.0)
Lactic Acid, Venous: 3.7 mmol/L (ref 0.5–2.0)

## 2015-04-14 LAB — PROTIME-INR
INR: 1.39 (ref 0.00–1.49)
PROTHROMBIN TIME: 17.2 s — AB (ref 11.6–15.2)

## 2015-04-14 LAB — PROCALCITONIN: PROCALCITONIN: 0.14 ng/mL

## 2015-04-14 LAB — GLUCOSE, CAPILLARY
GLUCOSE-CAPILLARY: 197 mg/dL — AB (ref 65–99)
GLUCOSE-CAPILLARY: 232 mg/dL — AB (ref 65–99)
GLUCOSE-CAPILLARY: 230 mg/dL — AB (ref 65–99)
Glucose-Capillary: 227 mg/dL — ABNORMAL HIGH (ref 65–99)

## 2015-04-14 LAB — TROPONIN I
TROPONIN I: 0.03 ng/mL (ref <0.031)
Troponin I: 0.06 ng/mL — ABNORMAL HIGH (ref <0.031)
Troponin I: 0.04 ng/mL — ABNORMAL HIGH (ref <0.031)

## 2015-04-14 LAB — AMMONIA: Ammonia: 180 umol/L — ABNORMAL HIGH (ref 9–35)

## 2015-04-14 LAB — CBG MONITORING, ED: Glucose-Capillary: 278 mg/dL — ABNORMAL HIGH (ref 65–99)

## 2015-04-14 LAB — APTT: APTT: 33 s (ref 24–37)

## 2015-04-14 LAB — MRSA PCR SCREENING: MRSA by PCR: NEGATIVE

## 2015-04-14 MED ORDER — RANOLAZINE ER 500 MG PO TB12
1000.0000 mg | ORAL_TABLET | Freq: Two times a day (BID) | ORAL | Status: DC
  Administered 2015-04-14 – 2015-04-20 (×13): 1000 mg via ORAL
  Filled 2015-04-14 (×18): qty 2

## 2015-04-14 MED ORDER — VANCOMYCIN HCL IN DEXTROSE 1-5 GM/200ML-% IV SOLN: 1000.0000 mg | Freq: Once | INTRAVENOUS | Status: DC

## 2015-04-14 MED ORDER — ONDANSETRON HCL 4 MG/2ML IJ SOLN: 4.0000 mg | Freq: Four times a day (QID) | INTRAMUSCULAR | Status: DC | PRN

## 2015-04-14 MED ORDER — ACETAMINOPHEN 650 MG RE SUPP
650.0000 mg | Freq: Once | RECTAL | Status: AC
  Administered 2015-04-14: 650 mg via RECTAL
  Filled 2015-04-14: qty 1

## 2015-04-14 MED ORDER — INSULIN NPH (HUMAN) (ISOPHANE) 100 UNIT/ML ~~LOC~~ SUSP
80.0000 [IU] | Freq: Every day | SUBCUTANEOUS | Status: DC
  Administered 2015-04-14 – 2015-04-17 (×4): 80 [IU] via SUBCUTANEOUS
  Filled 2015-04-14 (×2): qty 10

## 2015-04-14 MED ORDER — DEXTROSE 5 % IV SOLN
500.0000 mg | INTRAVENOUS | Status: DC
  Administered 2015-04-15 – 2015-04-16 (×2): 500 mg via INTRAVENOUS
  Filled 2015-04-14 (×5): qty 500

## 2015-04-14 MED ORDER — DEXTROSE 5 % IV SOLN
2.0000 g | Freq: Three times a day (TID) | INTRAVENOUS | Status: DC
  Administered 2015-04-14 – 2015-04-16 (×6): 2 g via INTRAVENOUS
  Filled 2015-04-14 (×11): qty 2

## 2015-04-14 MED ORDER — RIFAXIMIN 550 MG PO TABS
550.0000 mg | ORAL_TABLET | Freq: Two times a day (BID) | ORAL | Status: DC
  Administered 2015-04-14 – 2015-04-20 (×13): 550 mg via ORAL
  Filled 2015-04-14 (×14): qty 1

## 2015-04-14 MED ORDER — LACTULOSE 10 GM/15ML PO SOLN
20.0000 g | Freq: Once | ORAL | Status: AC
  Administered 2015-04-14: 20 g via ORAL
  Filled 2015-04-14: qty 30

## 2015-04-14 MED ORDER — INSULIN ASPART 100 UNIT/ML ~~LOC~~ SOLN
0.0000 [IU] | SUBCUTANEOUS | Status: DC
  Administered 2015-04-14 (×2): 3 [IU] via SUBCUTANEOUS
  Administered 2015-04-14: 2 [IU] via SUBCUTANEOUS
  Administered 2015-04-14 – 2015-04-15 (×2): 3 [IU] via SUBCUTANEOUS
  Administered 2015-04-15: 1 [IU] via SUBCUTANEOUS
  Administered 2015-04-15: 5 [IU] via SUBCUTANEOUS

## 2015-04-14 MED ORDER — SODIUM CHLORIDE 0.9 % IV SOLN
INTRAVENOUS | Status: DC
  Administered 2015-04-14: 100 mL/h via INTRAVENOUS

## 2015-04-14 MED ORDER — INFLUENZA VAC SPLIT QUAD 0.5 ML IM SUSY
0.5000 mL | PREFILLED_SYRINGE | INTRAMUSCULAR | Status: AC
  Administered 2015-04-15: 0.5 mL via INTRAMUSCULAR
  Filled 2015-04-14: qty 0.5

## 2015-04-14 MED ORDER — VANCOMYCIN HCL 10 G IV SOLR
2000.0000 mg | Freq: Once | INTRAVENOUS | Status: AC
  Administered 2015-04-14: 2000 mg via INTRAVENOUS
  Filled 2015-04-14: qty 2000

## 2015-04-14 MED ORDER — ACETAMINOPHEN 325 MG PO TABS: 650.0000 mg | ORAL_TABLET | Freq: Four times a day (QID) | ORAL | Status: DC | PRN

## 2015-04-14 MED ORDER — INSULIN NPH (HUMAN) (ISOPHANE) 100 UNIT/ML ~~LOC~~ SUSP: 50.0000 [IU] | SUBCUTANEOUS | Status: DC

## 2015-04-14 MED ORDER — HEPARIN SODIUM (PORCINE) 5000 UNIT/ML IJ SOLN
5000.0000 [IU] | Freq: Three times a day (TID) | INTRAMUSCULAR | Status: DC
  Administered 2015-04-14 – 2015-04-15 (×3): 5000 [IU] via SUBCUTANEOUS
  Filled 2015-04-14 (×3): qty 1

## 2015-04-14 MED ORDER — INSULIN NPH (HUMAN) (ISOPHANE) 100 UNIT/ML ~~LOC~~ SUSP
50.0000 [IU] | Freq: Every day | SUBCUTANEOUS | Status: DC
  Administered 2015-04-14 – 2015-04-16 (×3): 50 [IU] via SUBCUTANEOUS

## 2015-04-14 MED ORDER — VANCOMYCIN HCL 10 G IV SOLR
1250.0000 mg | Freq: Two times a day (BID) | INTRAVENOUS | Status: DC
  Administered 2015-04-14 – 2015-04-16 (×5): 1250 mg via INTRAVENOUS
  Filled 2015-04-14 (×6): qty 1250

## 2015-04-14 MED ORDER — LACTULOSE 10 GM/15ML PO SOLN
20.0000 g | Freq: Three times a day (TID) | ORAL | Status: DC
  Administered 2015-04-14 – 2015-04-20 (×15): 20 g via ORAL
  Filled 2015-04-14 (×18): qty 30

## 2015-04-14 MED ORDER — PIPERACILLIN-TAZOBACTAM 3.375 G IVPB 30 MIN
3.3750 g | Freq: Once | INTRAVENOUS | Status: AC
  Administered 2015-04-14: 3.375 g via INTRAVENOUS
  Filled 2015-04-14: qty 50

## 2015-04-14 MED ORDER — ATORVASTATIN CALCIUM 10 MG PO TABS
10.0000 mg | ORAL_TABLET | Freq: Every day | ORAL | Status: DC
  Administered 2015-04-14 – 2015-04-15 (×2): 10 mg via ORAL
  Filled 2015-04-14 (×2): qty 1

## 2015-04-14 NOTE — Progress Notes (Signed)
ANTIBIOTIC CONSULT NOTE - INITIAL  Pharmacy Consult for Vancomycin and Fortaz Indication: rule out sepsis  Allergies  Allergen Reactions  . Isosorbide     Nose bleeds    Patient Measurements: Height: 5' 10"  (177.8 cm) Weight: 282 lb (127.914 kg) IBW/kg (Calculated) : 73 Adjusted Body Weight: 95 kg  Vital Signs: Temp: 98.8 F (37.1 C) (10/04 0430) Temp Source: Oral (10/04 0430) BP: 87/51 mmHg (10/04 0615) Pulse Rate: 71 (10/04 0615) Intake/Output from previous day: 10/03 0701 - 10/04 0700 In: 3050 [I.V.:3000; IV Piggyback:50] Out: -  Intake/Output from this shift: Total I/O In: 3050 [I.V.:3000; IV Piggyback:50] Out: -   Labs:  Recent Labs  04/14/15 2347  WBC 9.6  HGB 13.8  PLT 61*  CREATININE 1.05   Estimated Creatinine Clearance: 101.8 mL/min (by C-G formula based on Cr of 1.05). No results for input(s): VANCOTROUGH, VANCOPEAK, VANCORANDOM, GENTTROUGH, GENTPEAK, GENTRANDOM, TOBRATROUGH, TOBRAPEAK, TOBRARND, AMIKACINPEAK, AMIKACINTROU, AMIKACIN in the last 72 hours.   Microbiology: No results found for this or any previous visit (from the past 720 hour(s)).  Medical History: Past Medical History  Diagnosis Date  . Cirrhosis ([place]) 2011    Cryptogenic, Likely NASH. Family/pt deny EtOH. HCV, HBV, HAV negative. ANA negative. AMA positive. Ascites 12/11  . Hyperlipemia   . Coronary artery disease     Inferior MI 12/11; LHC with occluded mid CFX and 80% proximal RCA. EF 55%. He had 3.0 x 28 vision BMS to CFX  . Diastolic CHF, acute ([place])     Echo 12/11 with ef 50-55% and mild LVH. EF 55% by LV0gram in 12/11  . Hypertension   . Type II diabetes mellitus ([place]) 2011  . SVT (supraventricular tachycardia) ([place])     1/12: appeared to be an ectopic atrial tachycardia. Required DCCV with hemodynamic instability  . GI bleed     12/11: Etiology not clearly defined. EGD: nonbleeding esophageal varices.  . S/P coronary artery stent placement 06/2010  . Esophageal varices  ([place]) 2011, 2013    no hx acute variceal bleed  . Hepatic encephalopathy ([place]) 2011, 12/2013  . Myocardial infarction [facility]) ? 2012  . Shortness of breath dyspnea   . Arthritis   . Hx of echocardiogram     Echo 5/16:  EF 55-60%, no RWMA, mod LAE  . Thrombocytopenia ([place])   . Cholelithiasis   . Rectal varices     Medications:  Prescriptions prior to admission  Medication Sig Dispense Refill Last Dose  . aspirin 81 MG tablet Take 81 mg by mouth daily.    Taking  . atorvastatin (LIPITOR) 10 MG tablet Take 1 tablet (10 mg total) by mouth daily. 30 tablet 6 Taking  . enalapril (VASOTEC) 20 MG tablet Take 10 mg by mouth daily.    Taking  . furosemide (LASIX) 40 MG tablet Take 40 mg by mouth daily.    Taking  . insulin NPH Human (HUMULIN N,NOVOLIN N) 100 UNIT/ML injection Inject 50-80 Units into the skin See admin instructions. Take 80 units in the morning and 50 units in the evening.   Taking  . lactulose (CHRONULAC) 10 GM/15ML solution Take 45 mLs (30 g total) by mouth 2 (two) times daily. Titrate to 3-4 bowel movements daily 1892 mL 5 Taking  . metFORMIN (GLUCOPHAGE) 1000 MG tablet Take 1,000 mg by mouth 2 (two) times daily.  11 Taking  . Multiple Vitamin (MULTIVITAMIN) capsule Take 1 capsule by mouth daily.     Taking  . nadolol (CORGARD) 40  MG tablet Take 1 tablet (40 mg total) by mouth daily. 30 tablet 5 Taking  . nitroGLYCERIN (NITROSTAT) 0.4 MG SL tablet Place 1 tablet (0.4 mg total) under the tongue every 5 (five) minutes as needed. 30 tablet 6 Taking  . omeprazole (PRILOSEC) 20 MG capsule Take 1 capsule (20 mg total) by mouth daily. 30 capsule 6 Taking  . ranolazine (RANEXA) 500 MG 12 hr tablet Take 2 tablets (1,000 mg total) by mouth 2 (two) times daily. 60 tablet 11 Taking  . rifaximin (XIFAXAN) 550 MG TABS tablet Take 1 tablet (550 mg total) by mouth 2 (two) times daily. 24 tablet 0 Taking  . sulfamethoxazole-trimethoprim (BACTRIM DS,SEPTRA DS) 800-160 MG per tablet Take 1 tablet by  mouth daily.   Taking   Assessment: 59 y.o. male with AMS/fever, possible PNA/sepsis, for empiric antibiotics.  Vancomycin 2000 mg IV given in ED at 0115  Goal of Therapy:  Vancomycin trough level 15-20 mcg/ml  Plan:  Vancomycin 1250 mg IV q12h Fortaz 2 g IV q8h F/U renal function  Sims Laday, Bronson Curb 04/14/2015,6:45 AM

## 2015-04-14 NOTE — ED Provider Notes (Signed)
CSN: [id number]   Arrival date & time 04/13/15 2313  History  By signing my name below, I, Altamease Oiler, attest that this documentation has been prepared under the direction and in the presence of Varney Biles, MD. Electronically Signed: Altamease Oiler, ED Scribe. 04/14/2015. 2:08 AM.  Chief Complaint  Patient presents with  . Altered Mental Status    HPI The history is provided by the spouse. No language interpreter was used.   Adam Butler is a 59 y.o. male with PMHx of cirrhosis, CAD s/p stent placement, MI, CHF, HLD, HTN, DM who presents to the Emergency Department complaining of altered mental status with sudden onset tonight around 8:30 PM. Per wife, the pt has liver cirrhosis with no known cause. Over the last several days the pt has not been feeling well and sleeping more but he was eating normally and able to take care of himself. Tonight he became increasingly confused after eating dinner and going to the grocery store. He has history of hepatic encephalopathy and last had this kind of confusion on 02/21/15 when his ammonia levels were elevated. Associated symptoms include complaint of intermittent headache, cough, and gait difficulty (pt shuffling per wife). His wife was not aware of any fever until they brought the pt here. She is also not aware of any history of fluid removal from the abdomen. No recent travels or sick contacts.   Pt typically makes his own medical decisions and has discussed with his wife that he does not want to be on a ventilator.   Past Medical History  Diagnosis Date  . Cirrhosis ([place]) 2011    Cryptogenic, Likely NASH. Family/pt deny EtOH. HCV, HBV, HAV negative. ANA negative. AMA positive. Ascites 12/11  . Hyperlipemia   . Coronary artery disease     Inferior MI 12/11; LHC with occluded mid CFX and 80% proximal RCA. EF 55%. He had 3.0 x 28 vision BMS to CFX  . Diastolic CHF, acute ([place])     Echo 12/11 with ef 50-55% and mild LVH. EF 55% by  LV0gram in 12/11  . Hypertension   . Type II diabetes mellitus ([place]) 2011  . SVT (supraventricular tachycardia) ([place])     1/12: appeared to be an ectopic atrial tachycardia. Required DCCV with hemodynamic instability  . GI bleed     12/11: Etiology not clearly defined. EGD: nonbleeding esophageal varices.  . S/P coronary artery stent placement 06/2010  . Esophageal varices ([place]) 2011, 2013    no hx acute variceal bleed  . Hepatic encephalopathy ([place]) 2011, 12/2013  . Myocardial infarction [facility]) ? 2012  . Shortness of breath dyspnea   . Arthritis   . Hx of echocardiogram     Echo 5/16:  EF 55-60%, no RWMA, mod LAE  . Thrombocytopenia ([place])   . Cholelithiasis   . Rectal varices     Past Surgical History  Procedure Laterality Date  . Cardiac catheterization  07/01/2010    BMS to CFX.  Marland Kitchen Appendectomy    . Orif r leg    . Esophagogastroduodenoscopy  04/13/2012    Procedure: ESOPHAGOGASTRODUODENOSCOPY (EGD);  Surgeon: Inda Castle, MD;  Location: [facility];  Service: Endoscopy;  Laterality: N/A;  . Colonoscopy  04/13/2012    Procedure: COLONOSCOPY;  Surgeon: Inda Castle, MD;  Location: [facility] ENDOSCOPY;  Service: Endoscopy;  Laterality: N/A;  . Esophagogastroduodenoscopy N/A 01/01/2014    Procedure: ESOPHAGOGASTRODUODENOSCOPY (EGD);  Surgeon: Jerene Bears, MD;  Location: [facility] ENDOSCOPY;  Service: Endoscopy;  Laterality: N/A;  . Esophageal banding N/A 01/01/2014    Procedure: ESOPHAGEAL BANDING;  Surgeon: Jerene Bears, MD;  Location: [facility] ENDOSCOPY;  Service: Endoscopy;  Laterality: N/A;  . Coronary stent placement  06/30/2010    CFX   Distal        . Left heart catheterization with coronary angiogram N/A 10/15/2014    Procedure: LEFT HEART CATHETERIZATION WITH CORONARY ANGIOGRAM;  Surgeon: Peter M Martinique, MD;  Location: [facility] CATH LAB;  Service: Cardiovascular;  Laterality: N/A;    Family History  Problem Relation Age of Onset  . Heart attack Brother 45    MI  . Heart attack Brother 31     MI  . Heart attack Father   . Diabetes Father   . Diabetes Brother   . COPD Mother   . Stroke Neg Hx     Social History  Substance Use Topics  . Smoking status: Current Every Day Smoker -- 1.00 packs/day for 36 years    Types: Cigarettes, E-cigarettes  . Smokeless tobacco: Current User     Comment: uses vapor cigarettes (2016 )  . Alcohol Use: No     Review of Systems  Unable to perform ROS: Mental status change   Home Medications   Prior to Admission medications   Medication Sig Start Date End Date Taking? Authorizing Provider  aspirin 81 MG tablet Take 81 mg by mouth daily.     Historical Provider, MD  atorvastatin (LIPITOR) 10 MG tablet Take 1 tablet (10 mg total) by mouth daily. 01/15/15   Larey Dresser, MD  enalapril (VASOTEC) 20 MG tablet Take 10 mg by mouth daily.     Historical Provider, MD  furosemide (LASIX) 40 MG tablet Take 40 mg by mouth daily.  01/09/12   Larey Dresser, MD  insulin NPH Human (HUMULIN N,NOVOLIN N) 100 UNIT/ML injection Inject 50-80 Units into the skin See admin instructions. Take 80 units in the morning and 50 units in the evening.    Historical Provider, MD  lactulose (CHRONULAC) 10 GM/15ML solution Take 45 mLs (30 g total) by mouth 2 (two) times daily. Titrate to 3-4 bowel movements daily 02/01/15   Corky Sox, MD  metFORMIN (GLUCOPHAGE) 1000 MG tablet Take 1,000 mg by mouth 2 (two) times daily. 11/27/14   Historical Provider, MD  Multiple Vitamin (MULTIVITAMIN) capsule Take 1 capsule by mouth daily.      Historical Provider, MD  nadolol (CORGARD) 40 MG tablet Take 1 tablet (40 mg total) by mouth daily. 01/01/15   Jerene Bears, MD  nitroGLYCERIN (NITROSTAT) 0.4 MG SL tablet Place 1 tablet (0.4 mg total) under the tongue every 5 (five) minutes as needed. 01/09/12   Larey Dresser, MD  omeprazole (PRILOSEC) 20 MG capsule Take 1 capsule (20 mg total) by mouth daily. 01/15/15   Larey Dresser, MD  ranolazine (RANEXA) 500 MG 12 hr tablet Take 2 tablets  (1,000 mg total) by mouth 2 (two) times daily. 01/15/15   Larey Dresser, MD  rifaximin (XIFAXAN) 550 MG TABS tablet Take 1 tablet (550 mg total) by mouth 2 (two) times daily. 02/13/15   Jerene Bears, MD  sulfamethoxazole-trimethoprim (BACTRIM DS,SEPTRA DS) 800-160 MG per tablet Take 1 tablet by mouth daily.    Historical Provider, MD    Allergies  Isosorbide  Triage Vitals: BP 131/57 mmHg  Pulse 94  Temp(Src) 104.5 F (40.3 C) (Rectal)  Resp 20  Ht 5' 10"  (1.778 m)  Wt  282 lb (127.914 kg)  BMI 40.46 kg/m2  SpO2 95%  Physical Exam  Constitutional: He appears well-developed and well-nourished. No distress.  HENT:  Head: Normocephalic and atraumatic.  Dry mucous membranes  Eyes: Conjunctivae and EOM are normal.  Pupils 3 mm and equal Sclera clear  Neck: Neck supple. No tracheal deviation present.  Cardiovascular: Normal rate and regular rhythm.   2+ and equal radial pulse  Pulmonary/Chest: Effort normal. No respiratory distress.  Abdominal: Soft. Bowel sounds are normal. There is tenderness (diffuse).  No peritoneal signs  Musculoskeletal: Normal range of motion.  1+ pitting edema BLE  Neurological: He is alert.  Oriented to self and place Following simple commands Appears to be confused  Skin: Skin is warm and dry.  Warm to touch  Psychiatric: He has a normal mood and affect. His behavior is normal.  Nursing note and vitals reviewed.   ED Course  Procedures   DIAGNOSTIC STUDIES: Oxygen Saturation is 95% on RA, normal by my interpretation.    COORDINATION OF CARE: 12:43 AM Discussed treatment plan which includes lab work, CXR, EKG, Tylenol, and abx with patient's wife at bedside and she agreed to plan.  Labs Reviewed  COMPREHENSIVE METABOLIC PANEL - Abnormal; Notable for the following:    Sodium 132 (*)    Chloride 100 (*)    Glucose, Bld 295 (*)    Albumin 3.0 (*)    AST 67 (*)    Alkaline Phosphatase 178 (*)    Total Bilirubin 2.3 (*)    All other  components within normal limits  CBC - Abnormal; Notable for the following:    RBC 4.01 (*)    MCH 34.4 (*)    Platelets 61 (*)    All other components within normal limits  AMMONIA - Abnormal; Notable for the following:    Ammonia 180 (*)    All other components within normal limits  URINALYSIS, ROUTINE W REFLEX MICROSCOPIC (NOT AT [facility]) - Abnormal; Notable for the following:    Color, Urine AMBER (*)    APPearance CLOUDY (*)    Specific Gravity, Urine 1.044 (*)    Glucose, UA >1000 (*)    Hgb urine dipstick SMALL (*)    Ketones, ur 15 (*)    All other components within normal limits  PROTIME-INR - Abnormal; Notable for the following:    Prothrombin Time 17.2 (*)    All other components within normal limits  URINE MICROSCOPIC-ADD ON - Abnormal; Notable for the following:    Squamous Epithelial / LPF FEW (*)    Bacteria, UA FEW (*)    All other components within normal limits  CBG MONITORING, ED - Abnormal; Notable for the following:    Glucose-Capillary 278 (*)    All other components within normal limits  I-STAT CG4 LACTIC ACID, ED - Abnormal; Notable for the following:    Lactic Acid, Venous 4.12 (*)    All other components within normal limits  CULTURE, BLOOD (ROUTINE X 2)  CULTURE, BLOOD (ROUTINE X 2)  URINE CULTURE  APTT  I-STAT CG4 LACTIC ACID, ED  I-STAT CG4 LACTIC ACID, ED    Imaging Review Ct Head Wo Contrast  04/14/2015   CLINICAL DATA:  Acute onset of altered mental status. Fruity odor to breath. Personal history of elevated ammonia. Initial encounter.  EXAM: CT HEAD WITHOUT CONTRAST  TECHNIQUE: Contiguous axial images were obtained from the base of the skull through the vertex without intravenous contrast.  COMPARISON:  CT of  the head performed 01/19/2015  FINDINGS: There is no evidence of acute infarction, mass lesion, or intra- or extra-axial hemorrhage on CT.  Scattered periventricular and subcortical white matter change likely reflects small vessel ischemic  microangiopathy. A small chronic infarct is noted at the right occipital lobe.  The posterior fossa, including the cerebellum, brainstem and fourth ventricle, is within normal limits. The third and lateral ventricles, and basal ganglia are unremarkable in appearance. No mass effect or midline shift is seen.  There is no evidence of fracture; visualized osseous structures are unremarkable in appearance. The orbits are within normal limits. The paranasal sinuses and mastoid air cells are well-aerated. No significant soft tissue abnormalities are seen.  IMPRESSION: 1. No acute intracranial pathology seen on CT. 2. Scattered small vessel ischemic microangiopathy. Small chronic infarct at the right occipital lobe.   Electronically Signed   By: Garald Balding M.D.   On: 04/14/2015 04:09   Dg Chest Port 1 View  04/14/2015   CLINICAL DATA:  Acute onset of shortness of breath. Initial encounter.  EXAM: PORTABLE CHEST 1 VIEW  COMPARISON:  Chest radiograph performed 01/31/2015  FINDINGS: The lungs are hypoexpanded. Vascular congestion is noted, with increased interstitial markings, concerning for mild pulmonary edema. There is no evidence of pleural effusion or pneumothorax.  The cardiomediastinal silhouette is borderline normal in size. No acute osseous abnormalities are seen.  IMPRESSION: Lungs hypoexpanded. Vascular congestion, with increased interstitial markings, concerning for mild pulmonary edema.   Electronically Signed   By: Garald Balding M.D.   On: 04/14/2015 01:26   Ct Renal Stone Study  04/14/2015   CLINICAL DATA:  Altered mental status, first noticed around 20:30.  EXAM: CT ABDOMEN AND PELVIS WITHOUT CONTRAST  TECHNIQUE: Multidetector CT imaging of the abdomen and pelvis was performed following the standard protocol without IV contrast.  COMPARISON:  12/29/2013  FINDINGS: No acute finding is evident in the lower chest.  The liver is cirrhotic. No focal liver lesion is evident on this unenhanced scan. There  is no bile duct dilatation. There are numerous calculi within the gallbladder lumen.  There are numerous very large varices, including very prominent varices around the distal esophagus and stomach. There is recanalization of the umbilical vein with caput medusa. There are numerous varices elsewhere in the abdomen and pelvis. The portal vein is enlarged and partially calcified. The calcification corresponds to the area of partial thrombosis observed on 12/29/2013. Patency of the portal vein cannot be established on this study. There is mild splenomegaly, probably slightly worsened. There is a very small volume of ascites collected dependently in the pelvis.  There is a 2 x 3 mm lower pole left collecting system calculus. There is no hydronephrosis or ureteral dilatation. There is no bowel obstruction. There is no extraluminal air. There is hazy high attenuation in the mesentery which is worsened from 12/29/2013. This mesenteric edema could be related to portal hypertension.  No significant skeletal lesion is evident.  IMPRESSION: 1. Cirrhosis with portal hypertension. Very large varices. Patency of the portal vein is indeterminate. 2. Very small volume of ascites collected dependently in the pelvis, new. Worsened mesenteric edema. 3. Mild splenomegaly, probably mildly worsened. 4. Left nephrolithiasis. 5. No evidence of bowel obstruction or perforation. No focal inflammatory changes are evident. 6. Cholelithiasis   Electronically Signed   By: Andreas Newport M.D.   On: 04/14/2015 04:19    EKG Interpretation  Date/Time:    Ventricular Rate:    PR Interval:  QRS Duration:   QT Interval:    QTC Calculation:   R Axis:     Text Interpretation:      MDM   Final diagnoses:  Abdominal pain  Septic shock (HCC)  Cirrhosis of liver with ascites, unspecified hepatic cirrhosis type ([place])  Acute hepatic encephalopathy ([place])      I personally performed the services described in this documentation,  which was scribed in my presence. The recorded information has been reviewed and is accurate.    Ultrasound limited abdominal  Indication: EVALUATE FOR ASCITES 2 views were obtained using the low frequency transducer: Hepatorenal and right lower quadrant Interpretation: No free fluid visualized surrounding the kidneys. There is a small collection in the pelvic region on the right side. Images archived electronically Dr. Kathrynn Humble personally performed and interpreted the images   CRITICAL CARE Performed by: Varney Biles   Total critical care time: 40 min  Critical care time was exclusive of separately billable procedures and treating other patients.  Critical care was necessary to treat or prevent imminent or life-threatening deterioration.  Critical care was time spent personally by me on the following activities: development of treatment plan with patient and/or surrogate as well as nursing, discussions with consultants, evaluation of patient's response to treatment, examination of patient, obtaining history from patient or surrogate, ordering and performing treatments and interventions, ordering and review of laboratory studies, ordering and review of radiographic studies, pulse oximetry and re-evaluation of patient's condition.   Pt comes in with cc of AMS. PT has hx of liver cirrhosis, CAD. He has fevers and borderline low BP. Concerns for sepsis as the underlying etiology, driving hepatic encephalopathy. Wife adamant that pt was doing well until after dinner - in fact she states that they all went for dinner together. Based on that information, and no meningismus- i think meningitis is unlikely. CT head ordered - and there is no space occupying lesion appreciated. Additionally - CXR shows no infiltrates, lungs are clear and the urine is not showing infection. Pt has some diffuse upper quadrant tenderness. No peritoneal signs. Since the source of infection is not clear - CT non  contrast ordered.  Finally- no rash, and neg CXR noted and bedside US reveals no decent window for diagnostic tap. Antibiotics and fluid hydration ordered. Lactate > 4. Not in DKA.   Sepsis - Repeat Assessment  Performed at:    4:15 am - more alert and oriented x 2 from oriented x 1.  Vitals     Blood pressure 100/36, pulse 73, temperature 99.1 F (37.3 C), temperature source Oral, resp. rate 22, height 5' 10"  (1.778 m), weight 282 lb (127.914 kg), SpO2 96 %.  Heart:     Regular rate and rhythm  Lungs:    CTA  Capillary Refill:   <2 sec  Peripheral Pulse:   Radial pulse palpable  Skin:     Normal Color     Varney Biles, MD 04/14/15 [phone]

## 2015-04-14 NOTE — Progress Notes (Signed)
Called [facility] regarding patient's low bp.  Spoke with [place] and asked to convey message to MD.  Will wait for response/orders if needed. [place], [place] 04/14/2015

## 2015-04-14 NOTE — Progress Notes (Signed)
CRITICAL VALUE ALERT  Critical value received:  2.5 lactic acid  Date of notification:  04/14/2015  Time of notification:  0180  Critical value read back:Yes.    Nurse who received alert:  Caro Hight, RN  MD notified (1st page):  Dr Nadara Mode  Time of first page:  1720  MD notified (2nd page):  Time of second page:  Responding MD:  Dr Nadara Mode  Time MD responded:  [phone]

## 2015-04-14 NOTE — Evaluation (Signed)
Physical Therapy Evaluation Patient Details Name: Adam Butler MRN: [id number] DOB: [birth year] Today's Date: 04/14/2015   History of Present Illness  59 year old male with PMH of cirrhosis secondary to NASH presented [facility] ED 10/4 with c/o altered mental status. In ED he was hypotensive with elevated lactic acid. Also with fever. Concern for sepsis, cholelithiasis, CT head (-), (+) blood cultures  Clinical Impression  Pt pleasant with decreased STM. Pt asking 2x during session for lunch and if he is transferring. Overall good mobility with pt with noted wheezing with gait and feeling SOB 2/4 with end of gait but sats maintained 98% on RA throughout. Pt with balance deficits noted and report of several falls this year who would benefit from OPPT to maximize balance and endurance. Pt will benefit from acute therapy to increase gait and balance to decrease fall risk.     Follow Up Recommendations Outpatient PT    Equipment Recommendations  None recommended by PT    Recommendations for Other Services       Precautions / Restrictions Precautions Precautions: Fall      Mobility  Bed Mobility               General bed mobility comments: in chair on arrival  Transfers Overall transfer level: Modified independent                  Ambulation/Gait Ambulation/Gait assistance: Min guard Ambulation Distance (Feet): 300 Feet Assistive device: None Gait Pattern/deviations: Step-through pattern;Decreased stride length   Gait velocity interpretation: Below normal speed for age/gender General Gait Details: Pt with steady gait with hall ambulation with forward head but with head turns pt with LOB or need to stop to turn to prevent fall. Pt with cues for LOB as he was not fully aware of LOB. Encouraged to use cane at all times for gait  Stairs            Wheelchair Mobility    Modified Rankin (Stroke Patients Only)       Balance Overall balance assessment: Needs  assistance   Sitting balance-Leahy Scale: Good       Standing balance-Leahy Scale: Good                 High Level Balance Comments: Pt reports at least 3 falls in the last year             Pertinent Vitals/Pain Pain Assessment: No/denies pain  Sat 98% on RA HR 81    Home Living Family/patient expects to be discharged to:: Private residence Living Arrangements: Spouse/significant other Available Help at Discharge: Family;Available PRN/intermittently Type of Home: House Home Access: Stairs to enter Entrance Stairs-Rails: Right;Left;Can reach both Entrance Stairs-Number of Steps: 2 Home Layout: One level Home Equipment: Walker - 2 wheels;Cane - single point      Prior Function Level of Independence: Independent         Comments: Uses cane at times if walking long distance.     Hand Dominance        Extremity/Trunk Assessment   Upper Extremity Assessment: Overall WFL for tasks assessed           Lower Extremity Assessment: Overall WFL for tasks assessed      Cervical / Trunk Assessment: Normal  Communication   Communication: No difficulties  Cognition Arousal/Alertness: Awake/alert Behavior During Therapy: WFL for tasks assessed/performed Overall Cognitive Status: Within Functional Limits for tasks assessed       Memory: Decreased  short-term memory              General Comments      Exercises        Assessment/Plan    PT Assessment Patient needs continued PT services  PT Diagnosis Difficulty walking   PT Problem List Decreased activity tolerance;Decreased balance;Decreased knowledge of use of DME  PT Treatment Interventions Gait training;Stair training;DME instruction;Functional mobility training;Balance training;Patient/family education   PT Goals (Current goals can be found in the Care Plan section) Acute Rehab PT Goals Patient Stated Goal: be able to walk to the mailbox and go camping PT Goal Formulation: With  patient Time For Goal Achievement: 04/28/15 Potential to Achieve Goals: Good    Frequency Min 3X/week   Barriers to discharge        Co-evaluation               End of Session   Activity Tolerance: Patient tolerated treatment well Patient left: in chair;with call bell/phone within reach;with nursing/sitter in room Nurse Communication: Mobility status         Time: 7902-4097 PT Time Calculation (min) (ACUTE ONLY): 15 min   Charges:   PT Evaluation $Initial PT Evaluation Tier I: 1 Procedure     PT G CodesMelford Aase 04/14/2015, 2:05 PM Elwyn Reach, [place]

## 2015-04-14 NOTE — Progress Notes (Signed)
Blood culture #1 and # 2 = gram positive ,+ cocci pairs and chains

## 2015-04-14 NOTE — Progress Notes (Signed)
PULMONARY / CRITICAL CARE MEDICINE   Name: Adam Butler MRN: [id number] DOB: 09/21/1955    ADMISSION DATE:  04/13/2015 CONSULTATION DATE:  04/14/2015  REFERRING MD :  EDP  CHIEF COMPLAINT:  Altered mental status  INITIAL PRESENTATION: 59 year old male with PMH of cirrhosis secondary to NASH presented to [facility] ED 10/4 with c/o altered mental status. In ED he was hypotensive with elevated lactic acid. Also with fever. Concern for sepsis. PCCM to admit.   STUDIES:  10/4 CT abd > Cirrhosis with portal hypertension. Very large varices. Patency of the portal vein is indeterminate. Very small volume of ascites collected dependently in the pelvis, new. Worsened mesenteric edema. Left nephrolithiasis. Cholelithiasis. 10/4 CT head > No acute intracranial pathology seen on CT. Scattered small vessel ischemic microangiopathy. Small chronic infarct at the right occipital lobe.  SIGNIFICANT EVENTS: 10/4 admit  HISTORY OF PRESENT ILLNESS:  59 year old male with PMH as below, which is significant for Cirrhosis secondary to NASH with several admissions already this year for recurrent hepatic encephalopathy. He takes lactulose and rifaximin at home for this. Also significant for DM, GI bleed, CAD s/p PCI 7867, and diastolic CHF.He was most recently admitted in August 2016 with hepatic encephalopathy which responded well to dose escalation of lactulose. He had been doing well since that time, up until about a week PTA when he developed some fatigue and sleeping more heavily during the day time. He takes his lactulose daily and has not missed any doses. He has 4 bowel movements per day. He started rifaximin just about a week ago. 10/3 PM He went out do dinner with his family, and seemed to act normal to his wife. After dinner they went grocery shopping and he states "he couldn't do this" because he was feeling badly. He then went home and showered, but was markedly confused. She thought his ammonia was probably  high, because this has happened several times in the recent past. In ED he was noted to be febrile with fever 104F and was hypotensive. Lactic acid was also noted to be elevated. CXR was obtained that demonstrated possible pneumonia and there was also concern for UTI. PCCM asked to see for admission.   SUBJECTIVE: No events since admission, never started on pressors.  VITAL SIGNS: Temp:  [98.1 F (36.7 C)-104.5 F (40.3 C)] 98.1 F (36.7 C) (10/04 0803) Pulse Rate:  [65-95] 65 (10/04 0803) Resp:  [12-33] 25 (10/04 0803) BP: (87-131)/(25-57) 103/49 mmHg (10/04 0803) SpO2:  [93 %-100 %] 99 % (10/04 0803) Weight:  [126.1 kg (278 lb)-127.914 kg (282 lb)] 126.1 kg (278 lb) (10/04 0644) HEMODYNAMICS:   VENTILATOR SETTINGS:   INTAKE / OUTPUT:  Intake/Output Summary (Last 24 hours) at 04/14/15 0856 Last data filed at 04/14/15 0800  Gross per 24 hour  Intake   3100 ml  Output    225 ml  Net   2875 ml    PHYSICAL EXAMINATION: General:  Morbidly obese male in mild distress Neuro:  Alert, oriented, non-focal HEENT:  [place]/AT, PERR, JVD difficult to discern due to neck girth Cardiovascular:  RRR, no MRG Lungs:  Clear bilateral breath sounds Abdomen:  Soft, non-tender, non-distended Musculoskeletal:  No acute deformity or ROM limitations Skin:  Grossly intact  LABS:  CBC  Recent Labs Lab 04/14/15 2347  WBC 9.6  HGB 13.8  HCT 40.1  PLT 61*   Coag's  Recent Labs Lab 04/14/15 0105  APTT 33  INR 1.39   BMET  Recent Labs Lab 04/14/15 2347  NA 132*  K 4.2  CL 100*  CO2 22  BUN 9  CREATININE 1.05  GLUCOSE 295*   Electrolytes  Recent Labs Lab 04/14/15 2347  CALCIUM 9.2   Sepsis Markers  Recent Labs Lab 04/14/15 04/14/15 0432 04/14/15 0543  LATICACIDVEN 4.12* 3.70*  --   PROCALCITON  --   --  0.14   ABG No results for input(s): PHART, PCO2ART, PO2ART in the last 168 hours. Liver Enzymes  Recent Labs Lab 04/14/15 2347  AST 67*  ALT 44  ALKPHOS 178*   BILITOT 2.3*  ALBUMIN 3.0*   Cardiac Enzymes  Recent Labs Lab 04/14/15 0543  TROPONINI 0.06*   Glucose  Recent Labs Lab 04/13/15 2335 04/14/15 0738  GLUCAP 278* 227*   Imaging Ct Head Wo Contrast  04/14/2015   CLINICAL DATA:  Acute onset of altered mental status. Fruity odor to breath. Personal history of elevated ammonia. Initial encounter.  EXAM: CT HEAD WITHOUT CONTRAST  TECHNIQUE: Contiguous axial images were obtained from the base of the skull through the vertex without intravenous contrast.  COMPARISON:  CT of the head performed 01/19/2015  FINDINGS: There is no evidence of acute infarction, mass lesion, or intra- or extra-axial hemorrhage on CT.  Scattered periventricular and subcortical white matter change likely reflects small vessel ischemic microangiopathy. A small chronic infarct is noted at the right occipital lobe.  The posterior fossa, including the cerebellum, brainstem and fourth ventricle, is within normal limits. The third and lateral ventricles, and basal ganglia are unremarkable in appearance. No mass effect or midline shift is seen.  There is no evidence of fracture; visualized osseous structures are unremarkable in appearance. The orbits are within normal limits. The paranasal sinuses and mastoid air cells are well-aerated. No significant soft tissue abnormalities are seen.  IMPRESSION: 1. No acute intracranial pathology seen on CT. 2. Scattered small vessel ischemic microangiopathy. Small chronic infarct at the right occipital lobe.   Electronically Signed   By: Garald Balding M.D.   On: 04/14/2015 04:09   Dg Chest Port 1 View  04/14/2015   CLINICAL DATA:  Acute onset of shortness of breath. Initial encounter.  EXAM: PORTABLE CHEST 1 VIEW  COMPARISON:  Chest radiograph performed 01/31/2015  FINDINGS: The lungs are hypoexpanded. Vascular congestion is noted, with increased interstitial markings, concerning for mild pulmonary edema. There is no evidence of pleural  effusion or pneumothorax.  The cardiomediastinal silhouette is borderline normal in size. No acute osseous abnormalities are seen.  IMPRESSION: Lungs hypoexpanded. Vascular congestion, with increased interstitial markings, concerning for mild pulmonary edema.   Electronically Signed   By: Garald Balding M.D.   On: 04/14/2015 01:26   Ct Renal Stone Study  04/14/2015   CLINICAL DATA:  Altered mental status, first noticed around 20:30.  EXAM: CT ABDOMEN AND PELVIS WITHOUT CONTRAST  TECHNIQUE: Multidetector CT imaging of the abdomen and pelvis was performed following the standard protocol without IV contrast.  COMPARISON:  12/29/2013  FINDINGS: No acute finding is evident in the lower chest.  The liver is cirrhotic. No focal liver lesion is evident on this unenhanced scan. There is no bile duct dilatation. There are numerous calculi within the gallbladder lumen.  There are numerous very large varices, including very prominent varices around the distal esophagus and stomach. There is recanalization of the umbilical vein with caput medusa. There are numerous varices elsewhere in the abdomen and pelvis. The portal vein is enlarged and partially calcified. The  calcification corresponds to the area of partial thrombosis observed on 12/29/2013. Patency of the portal vein cannot be established on this study. There is mild splenomegaly, probably slightly worsened. There is a very small volume of ascites collected dependently in the pelvis.  There is a 2 x 3 mm lower pole left collecting system calculus. There is no hydronephrosis or ureteral dilatation. There is no bowel obstruction. There is no extraluminal air. There is hazy high attenuation in the mesentery which is worsened from 12/29/2013. This mesenteric edema could be related to portal hypertension.  No significant skeletal lesion is evident.  IMPRESSION: 1. Cirrhosis with portal hypertension. Very large varices. Patency of the portal vein is indeterminate. 2. Very  small volume of ascites collected dependently in the pelvis, new. Worsened mesenteric edema. 3. Mild splenomegaly, probably mildly worsened. 4. Left nephrolithiasis. 5. No evidence of bowel obstruction or perforation. No focal inflammatory changes are evident. 6. Cholelithiasis   Electronically Signed   By: Andreas Newport M.D.   On: 04/14/2015 04:19   I reviewed CXR myself, mild pulmonary edema.  Discussed with TR[facility]-MD.  ASSESSMENT / PLAN:  PULMONARY A: Healthcare Associated Pneumonia - Chronically on Bactrim.  P:   Supplemental O2 as needed to keep SpO2 > 92% See ID section Incentive spirometry Mobilize as able.  CARDIOVASCULAR A:  Hypotensive, suspect severe sepsis H/O CAD H/O HLD H/O HTN  P:  Telemetry Holding home lasix, asa, enalapril, and nadolol while hypotensive Continue home atorvastatin  MAP goal > 65 mm/Hg Trend troponin KVO IVF  RENAL A:   Acute Renal Failure Mild hyponatremia Lactic Acidosis - improving Nephrolithiasis without obstruction  P:   Trending renal function with daily BUN/Creatinine Monitoring UOP KVO IVF Replace electrolytes as indicated Trending lactic acid  GASTROINTESTINAL A:   Cirrhosis of liver secondary to NASH Ascites H/O hepatic encepahlopathy Varices  P:   Lactulose and rifaximin as below Protonix for SUP Carb modified diet  HEMATOLOGIC A:  Thrombocytopenia - baseline  P:  Monitoring platelet count with daily CBC SCDs Heparing [place] q8hr  INFECTIOUS A:   Severe sepsis - possible sources include HCAP, SBP, UTI  P:   BCx2 10/4 >>> UC 10/4 >>> Sputum 10/4 >>> RVP 10/4 >>> Urine Strep 10/4 >>> Urine legionella 10/4 >>>  Abx: Zosyn, start date 10/4 >>> Abx: Vancomycin, start date 10/4 >>> Abx: Azithromycin, start date 10/4 >>>  ENDOCRINE A:   Hyperglycemia with history of DM Early DKA vs Early HHNK  P:   CBG monitoring q4hr SSI low dose algorithm NPH per home regimen since taking PO  now.  NEUROLOGIC A:   Acute encephalopathy - Improving. likely multifactorial. Hepatic, metabolic  P:   RASS goal: 0 Monitor Continue lactulose, rifaximin  Limiting sedating medications  Will transfer to SDU and to [facility] service with PCCM off 10/5.  Rush Farmer, M.D. [facility] Pulmonary/Critical Care Medicine. Pager: [phone]. After hours pager: [phone].

## 2015-04-14 NOTE — Plan of Care (Signed)
Problem: Phase I Progression Outcomes Goal: OOB as tolerated unless otherwise ordered Outcome: Completed/Met Date Met:  04/14/15 Ambulated in hall 300 ft (2 times around unit) with no complications.

## 2015-04-14 NOTE — Care Management Note (Signed)
Case Management Note  Patient Details  Name: Adam Butler MRN: [id number] Date of Birth: 1956-05-31  Subjective/Objective:    Pt admitted on 04/13/15 with HCAP, sepsis.  PTA, pt independent, lives with spouse.              Action/Plan: Will follow for discharge planning as pt progresses.  PT recommending no OP follow up.    Expected Discharge Date:                  Expected Discharge Plan:  Home/Self Care  In-House Referral:     Discharge planning Services  CM Consult  Post Acute Care Choice:    Choice offered to:     DME Arranged:    DME Agency:     HH Arranged:    HH Agency:     Status of Service:  In process, will continue to follow  Medicare Important Message Given:    Date Medicare IM Given:    Medicare IM give by:    Date Additional Medicare IM Given:    Additional Medicare Important Message give by:     If discussed at [place] of Stay Meetings, dates discussed:    Additional Comments:  Reinaldo Raddle, RN, BSN  Trauma/Neuro ICU Case Manager [phone]

## 2015-04-14 NOTE — H&P (Signed)
PULMONARY / CRITICAL CARE MEDICINE   Name: Adam Butler MRN: [id number] DOB: 1956-07-06    ADMISSION DATE:  04/13/2015 CONSULTATION DATE:  04/14/2015  REFERRING MD :  EDP  CHIEF COMPLAINT:  Altered mental status  INITIAL PRESENTATION: 59 year old male with PMH of cirrhosis secondary to NASH presented to [facility] ED 10/4 with c/o altered mental status. In ED he was hypotensive with elevated lactic acid. Also with fever. Concern for sepsis. PCCM to admit.   STUDIES:  10/4 CT abd > Cirrhosis with portal hypertension. Very large varices. Patency of the portal vein is indeterminate. Very small volume of ascites collected dependently in the pelvis, new. Worsened mesenteric edema. Left nephrolithiasis. Cholelithiasis. 10/4 CT head > No acute intracranial pathology seen on CT. Scattered small vessel ischemic microangiopathy. Small chronic infarct at the right occipital lobe.  SIGNIFICANT EVENTS: 10/4 admit  HISTORY OF PRESENT ILLNESS:  59 year old male with PMH as below, which is significant for Cirrhosis secondary to NASH with several admissions already this year for recurrent hepatic encephalopathy. He takes lactulose and rifaximin at home for this. Also significant for DM, GI bleed, CAD s/p PCI 1638, and diastolic CHF.He was most recently admitted in August 2016 with hepatic encephalopathy which responded well to dose escalation of lactulose. He had been doing well since that time, up until about a week PTA when he developed some fatigue and sleeping more heavily during the day time. He takes his lactulose daily and has not missed any doses. He has 4 bowel movements per day. He started rifaximin just about a week ago. 10/3 PM He went out do dinner with his family, and seemed to act normal to his wife. After dinner they went grocery shopping and he states "he couldn't do this" because he was feeling badly. He then went home and showered, but was markedly confused. She thought his ammonia was probably  high, because this has happened several times in the recent past. In ED he was noted to be febrile with fever 104F and was hypotensive. Lactic acid was also noted to be elevated. CXR was obtained that demonstrated possible pneumonia and there was also concern for UTI. PCCM asked to see for admission.   PAST MEDICAL HISTORY :   has a past medical history of Cirrhosis ([place]) (2011); Hyperlipemia; Coronary artery disease; Diastolic CHF, acute ([place]); Hypertension; Type II diabetes mellitus ([place]) (2011); SVT (supraventricular tachycardia) ([place]); GI bleed; S/P coronary artery stent placement (06/2010); Esophageal varices ([place]) (2011, 2013); Hepatic encephalopathy ([place]) (2011, 12/2013); Myocardial infarction ([place]) (? 2012); Shortness of breath dyspnea; Arthritis; echocardiogram; Thrombocytopenia ([place]); Cholelithiasis; and Rectal varices.  has past surgical history that includes Cardiac catheterization (07/01/2010); Appendectomy; orif r leg; Esophagogastroduodenoscopy (04/13/2012); Colonoscopy (04/13/2012); Esophagogastroduodenoscopy (N/A, 01/01/2014); esophageal banding (N/A, 01/01/2014); Coronary stent placement (06/30/2010); and left heart catheterization with coronary angiogram (N/A, 10/15/2014). Prior to Admission medications   Medication Sig Start Date End Date Taking? Authorizing Provider  aspirin 81 MG tablet Take 81 mg by mouth daily.     Historical Provider, MD  atorvastatin (LIPITOR) 10 MG tablet Take 1 tablet (10 mg total) by mouth daily. 01/15/15   Larey Dresser, MD  enalapril (VASOTEC) 20 MG tablet Take 10 mg by mouth daily.     Historical Provider, MD  furosemide (LASIX) 40 MG tablet Take 40 mg by mouth daily.  01/09/12   Larey Dresser, MD  insulin NPH Human (HUMULIN N,NOVOLIN N) 100 UNIT/ML injection Inject 50-80 Units into the skin See  admin instructions. Take 80 units in the morning and 50 units in the evening.    Historical Provider, MD  lactulose (CHRONULAC) 10 GM/15ML solution Take 45 mLs (30 g  total) by mouth 2 (two) times daily. Titrate to 3-4 bowel movements daily 02/01/15   Corky Sox, MD  metFORMIN (GLUCOPHAGE) 1000 MG tablet Take 1,000 mg by mouth 2 (two) times daily. 11/27/14   Historical Provider, MD  Multiple Vitamin (MULTIVITAMIN) capsule Take 1 capsule by mouth daily.      Historical Provider, MD  nadolol (CORGARD) 40 MG tablet Take 1 tablet (40 mg total) by mouth daily. 01/01/15   Jerene Bears, MD  nitroGLYCERIN (NITROSTAT) 0.4 MG SL tablet Place 1 tablet (0.4 mg total) under the tongue every 5 (five) minutes as needed. 01/09/12   Larey Dresser, MD  omeprazole (PRILOSEC) 20 MG capsule Take 1 capsule (20 mg total) by mouth daily. 01/15/15   Larey Dresser, MD  ranolazine (RANEXA) 500 MG 12 hr tablet Take 2 tablets (1,000 mg total) by mouth 2 (two) times daily. 01/15/15   Larey Dresser, MD  rifaximin (XIFAXAN) 550 MG TABS tablet Take 1 tablet (550 mg total) by mouth 2 (two) times daily. 02/13/15   Jerene Bears, MD  sulfamethoxazole-trimethoprim (BACTRIM DS,SEPTRA DS) 800-160 MG per tablet Take 1 tablet by mouth daily.    Historical Provider, MD   Allergies  Allergen Reactions  . Isosorbide     Nose bleeds    FAMILY HISTORY:  indicated that his mother is alive. He indicated that his father is deceased. He indicated that his sister is alive. He indicated that all of his three brothers are alive.  SOCIAL HISTORY:  reports that he has been smoking Cigarettes and E-cigarettes.  He has a 36 pack-year smoking history. He uses smokeless tobacco. He reports that he does not drink alcohol or use illicit drugs.  REVIEW OF SYSTEMS:   Bolds are positive  Constitutional: weight loss, gain, night sweats, Fevers, chills, fatigue .  HEENT: headaches, Sore throat, sneezing, nasal congestion, post nasal drip, Difficulty swallowing, Tooth/dental problems, visual complaints visual changes, ear ache CV:  chest pain, radiates: ,Orthopnea, PND, swelling in lower extremities, dizziness, palpitations,  syncope.  GI  heartburn, indigestion, abdominal pain, nausea, vomiting, diarrhea, change in bowel habits, loss of appetite, bloody stools.  Resp: cough, productive: , hemoptysis, dyspnea, chest pain, pleuritic.  Skin: rash or itching or icterus GU: dysuria, change in color of urine, urgency or frequency. flank pain, hematuria  MS: joint pain or swelling. decreased range of motion  Psych: change in mood or affect. depression or anxiety.  Neuro: difficulty with speech, weakness, numbness, ataxia    SUBJECTIVE:   VITAL SIGNS: Temp:  [99.1 F (37.3 C)-104.5 F (40.3 C)] 99.1 F (37.3 C) (10/04 0356) Pulse Rate:  [73-95] 73 (10/04 0430) Resp:  [18-33] 22 (10/04 0230) BP: (92-131)/(25-57) 100/36 mmHg (10/04 0430) SpO2:  [94 %-100 %] 96 % (10/04 0430) Weight:  [127.914 kg (282 lb)] 127.914 kg (282 lb) (10/03 2325) HEMODYNAMICS:   VENTILATOR SETTINGS:   INTAKE / OUTPUT:  Intake/Output Summary (Last 24 hours) at 04/14/15 0511 Last data filed at 04/14/15 0334  Gross per 24 hour  Intake   3050 ml  Output      0 ml  Net   3050 ml    PHYSICAL EXAMINATION: General:  Morbidly obese male in mild distress Neuro:  Alert, oriented, non-focal HEENT:  [place]/AT, PERR, JVD difficult to discern  due to neck girth Cardiovascular:  RRR, no MRG Lungs:  Clear bilateral breath sounds Abdomen:  Soft, non-tender, non-distended Musculoskeletal:  No acute deformity or ROM limitations Skin:  Grossly intact  LABS:  CBC  Recent Labs Lab 04/14/15 2347  WBC 9.6  HGB 13.8  HCT 40.1  PLT 61*   Coag's  Recent Labs Lab 04/14/15 0105  APTT 33  INR 1.39   BMET  Recent Labs Lab 04/14/15 2347  NA 132*  K 4.2  CL 100*  CO2 22  BUN 9  CREATININE 1.05  GLUCOSE 295*   Electrolytes  Recent Labs Lab 04/14/15 2347  CALCIUM 9.2   Sepsis Markers  Recent Labs Lab 04/14/15 04/14/15 0432  LATICACIDVEN 4.12* 3.70*   ABG No results for input(s): PHART, PCO2ART, PO2ART in the last 168  hours. Liver Enzymes  Recent Labs Lab 04/14/15 2347  AST 67*  ALT 44  ALKPHOS 178*  BILITOT 2.3*  ALBUMIN 3.0*   Cardiac Enzymes No results for input(s): TROPONINI, PROBNP in the last 168 hours. Glucose  Recent Labs Lab 04/13/15 2335  GLUCAP 278*    Imaging Ct Head Wo Contrast  04/14/2015   CLINICAL DATA:  Acute onset of altered mental status. Fruity odor to breath. Personal history of elevated ammonia. Initial encounter.  EXAM: CT HEAD WITHOUT CONTRAST  TECHNIQUE: Contiguous axial images were obtained from the base of the skull through the vertex without intravenous contrast.  COMPARISON:  CT of the head performed 01/19/2015  FINDINGS: There is no evidence of acute infarction, mass lesion, or intra- or extra-axial hemorrhage on CT.  Scattered periventricular and subcortical white matter change likely reflects small vessel ischemic microangiopathy. A small chronic infarct is noted at the right occipital lobe.  The posterior fossa, including the cerebellum, brainstem and fourth ventricle, is within normal limits. The third and lateral ventricles, and basal ganglia are unremarkable in appearance. No mass effect or midline shift is seen.  There is no evidence of fracture; visualized osseous structures are unremarkable in appearance. The orbits are within normal limits. The paranasal sinuses and mastoid air cells are well-aerated. No significant soft tissue abnormalities are seen.  IMPRESSION: 1. No acute intracranial pathology seen on CT. 2. Scattered small vessel ischemic microangiopathy. Small chronic infarct at the right occipital lobe.   Electronically Signed   By: Garald Balding M.D.   On: 04/14/2015 04:09   Dg Chest Port 1 View  04/14/2015   CLINICAL DATA:  Acute onset of shortness of breath. Initial encounter.  EXAM: PORTABLE CHEST 1 VIEW  COMPARISON:  Chest radiograph performed 01/31/2015  FINDINGS: The lungs are hypoexpanded. Vascular congestion is noted, with increased interstitial  markings, concerning for mild pulmonary edema. There is no evidence of pleural effusion or pneumothorax.  The cardiomediastinal silhouette is borderline normal in size. No acute osseous abnormalities are seen.  IMPRESSION: Lungs hypoexpanded. Vascular congestion, with increased interstitial markings, concerning for mild pulmonary edema.   Electronically Signed   By: Garald Balding M.D.   On: 04/14/2015 01:26   Ct Renal Stone Study  04/14/2015   CLINICAL DATA:  Altered mental status, first noticed around 20:30.  EXAM: CT ABDOMEN AND PELVIS WITHOUT CONTRAST  TECHNIQUE: Multidetector CT imaging of the abdomen and pelvis was performed following the standard protocol without IV contrast.  COMPARISON:  12/29/2013  FINDINGS: No acute finding is evident in the lower chest.  The liver is cirrhotic. No focal liver lesion is evident on this unenhanced scan. There is no  bile duct dilatation. There are numerous calculi within the gallbladder lumen.  There are numerous very large varices, including very prominent varices around the distal esophagus and stomach. There is recanalization of the umbilical vein with caput medusa. There are numerous varices elsewhere in the abdomen and pelvis. The portal vein is enlarged and partially calcified. The calcification corresponds to the area of partial thrombosis observed on 12/29/2013. Patency of the portal vein cannot be established on this study. There is mild splenomegaly, probably slightly worsened. There is a very small volume of ascites collected dependently in the pelvis.  There is a 2 x 3 mm lower pole left collecting system calculus. There is no hydronephrosis or ureteral dilatation. There is no bowel obstruction. There is no extraluminal air. There is hazy high attenuation in the mesentery which is worsened from 12/29/2013. This mesenteric edema could be related to portal hypertension.  No significant skeletal lesion is evident.  IMPRESSION: 1. Cirrhosis with portal  hypertension. Very large varices. Patency of the portal vein is indeterminate. 2. Very small volume of ascites collected dependently in the pelvis, new. Worsened mesenteric edema. 3. Mild splenomegaly, probably mildly worsened. 4. Left nephrolithiasis. 5. No evidence of bowel obstruction or perforation. No focal inflammatory changes are evident. 6. Cholelithiasis   Electronically Signed   By: Andreas Newport M.D.   On: 04/14/2015 04:19     ASSESSMENT / PLAN:  PULMONARY A: Healthcare Associated Pneumonia - Chronically on Bactrim.  P:   Supplemental O2 as needed to keep SpO2 > 92% See ID section Incentive spirometry  CARDIOVASCULAR A:  Hypotensive, suspect severe sepsis H/O CAD H/O HLD H/O HTN  P:  Telemetry Holding home lasix, asa, enalapril, and nadolol while hypotensive Continue home atorvastatin  MAP goal > 65 mm/Hg Trend troponin Gentle hydration  RENAL A:   Acute Renal Failure Mild hyponatremia Lactic Acidosis - improving Nephrolithiasis without obstruction  P:   Trending renal function with daily BUN/Creatinine Monitoring UOP Gentle hydration with NS for a total of 500cc Trending lactic acid  GASTROINTESTINAL A:   Cirrhosis of liver secondary to NASH Ascites H/O hepatic encepahlopathy Varices  P:   Lactulose and rifaximin as below Protonix for SUP NPO except for meds  HEMATOLOGIC A:  Thrombocytopenia - baseline  P:  Monitoring platelet count with daily CBC SCDs Hep[place] q8hr  INFECTIOUS A:   Severe sepsis - possible sources include HCAP, SBP, UTI  P:   BCx2 10/4 >>> UC 10/4 >>> Sputum 10/4 >>> RVP 10/4 >>> Urine Strep 10/4 >>> Urine legionella 10/4 >>>  Abx: Zosyn, start date 10/4 >>> Abx: Vancomycin, start date 10/4 >>> Abx: Azithromycin, start date 10/4 >>>  ENDOCRINE A:   Hyperglycemia with history of DM Early DKA vs Early HHNK  P:   CBG monitoring q4hr SSI low dose algorithm NPH per home regimen  NEUROLOGIC A:    Acute encephalopathy - Improving. likely multifactorial. Hepatic, metabolic  P:   RASS goal: 0 Monitor Continue lactulose, rifaximin  Limiting sedating medications   FAMILY  - Updates: patient and wife at bedside in ED  - Inter-disciplinary family meet or Palliative Care meeting due by:  10/11   Georgann Housekeeper, AGACNP-BC [facility] Pager [phone] or [phone] 04/14/2015 6:04 AM  PCCM Attending Note: Patient seen and examined with nurse practitioner. Please refer to his admission H&P above. Patient admitted with sepsis and mild acute renal failure likely secondary to prerenal hypovolemia in the setting of hyperglycemia with early DKA versus  HHNK. Limiting sedating medications. Continuing broad-spectrum antibiotics with vancomycin, ceftaz edema, & azithromycin given chronic use of Bactrim for SBP. Checking urine and serum studies as well as cultures to determine infectious etiology. Patient is full CODE STATUS per my discussion with he and his wife this morning. Currently has minimal asterixis from hepatic encephalopathy. Oriented to person, place, time, & situation.  I have spent a total of 33 minutes of critical care time today caring for the patient, discussing the plan of care with his wife at bedside, & reviewing the patient's electronic medical record.  Sonia Baller Ashok Cordia, M.D. [facility] Pager:  [phone] After 3pm or if no response, call [phone]

## 2015-04-14 NOTE — Progress Notes (Signed)
eLink Physician-Brief Progress Note Patient Name: Adam Butler DOB: 1955-08-04 MRN: [id number]   Date of Service  04/14/2015  HPI/Events of Note  Pt walking icu Appears good sys 100-105 La unchanged 2.5 in setting cirhsosis  eICU Interventions       Intervention Category Intermediate Interventions: Diagnostic test evaluation  Raylene Miyamoto. 04/14/2015, 5:22 PM

## 2015-04-15 DIAGNOSIS — K7469 Other cirrhosis of liver: Secondary | ICD-10-CM

## 2015-04-15 DIAGNOSIS — A409 Streptococcal sepsis, unspecified: Secondary | ICD-10-CM | POA: Diagnosis not present

## 2015-04-15 LAB — BASIC METABOLIC PANEL
Anion gap: 7 (ref 5–15)
BUN: 6 mg/dL (ref 6–20)
CALCIUM: 7.9 mg/dL — AB (ref 8.9–10.3)
CO2: 24 mmol/L (ref 22–32)
Chloride: 103 mmol/L (ref 101–111)
Creatinine, Ser: 0.72 mg/dL (ref 0.61–1.24)
GFR calc Af Amer: >60 mL/min (ref >60)
GLUCOSE: 231 mg/dL — AB (ref 65–99)
POTASSIUM: 3.5 mmol/L (ref 3.5–5.1)
Sodium: 134 mmol/L — ABNORMAL LOW (ref 135–145)

## 2015-04-15 LAB — URINE CULTURE

## 2015-04-15 LAB — CBC WITH DIFFERENTIAL/PLATELET
Basophils Absolute: 0 10*3/uL (ref 0.0–0.1)
Basophils Relative: 0 %
EOS PCT: 3 %
Eosinophils Absolute: 0.1 10*3/uL (ref 0.0–0.7)
HEMATOCRIT: 34.2 % — AB (ref 39.0–52.0)
Hemoglobin: 11.6 g/dL — ABNORMAL LOW (ref 13.0–17.0)
LYMPHS ABS: 0.8 10*3/uL (ref 0.7–4.0)
LYMPHS PCT: 27 %
MCH: 34.2 pg — AB (ref 26.0–34.0)
MCHC: 33.9 g/dL (ref 30.0–36.0)
MCV: 100.9 fL — AB (ref 78.0–100.0)
MONO ABS: 0.6 10*3/uL (ref 0.1–1.0)
Monocytes Relative: 20 %
Neutro Abs: 1.5 10*3/uL — ABNORMAL LOW (ref 1.7–7.7)
Neutrophils Relative %: 49 %
PLATELETS: 32 10*3/uL — AB (ref 150–400)
RBC: 3.39 MIL/uL — ABNORMAL LOW (ref 4.22–5.81)
RDW: 14.1 % (ref 11.5–15.5)
WBC: 3 10*3/uL — ABNORMAL LOW (ref 4.0–10.5)

## 2015-04-15 LAB — GLUCOSE, CAPILLARY
GLUCOSE-CAPILLARY: 208 mg/dL — AB (ref 65–99)
Glucose-Capillary: 122 mg/dL — ABNORMAL HIGH (ref 65–99)
Glucose-Capillary: 183 mg/dL — ABNORMAL HIGH (ref 65–99)
Glucose-Capillary: 186 mg/dL — ABNORMAL HIGH (ref 65–99)
Glucose-Capillary: 240 mg/dL — ABNORMAL HIGH (ref 65–99)
Glucose-Capillary: 275 mg/dL — ABNORMAL HIGH (ref 65–99)

## 2015-04-15 LAB — PROCALCITONIN: Procalcitonin: 0.15 ng/mL

## 2015-04-15 LAB — ALBUMIN: Albumin: 2.3 g/dL — ABNORMAL LOW (ref 3.5–5.0)

## 2015-04-15 LAB — PHOSPHORUS: Phosphorus: 2.8 mg/dL (ref 2.5–4.6)

## 2015-04-15 LAB — HEMOGLOBIN A1C
HEMOGLOBIN A1C: 8.2 % — AB (ref 4.8–5.6)
MEAN PLASMA GLUCOSE: 189 mg/dL

## 2015-04-15 LAB — STREP PNEUMONIAE URINARY ANTIGEN: STREP PNEUMO URINARY ANTIGEN: NEGATIVE

## 2015-04-15 LAB — MAGNESIUM: Magnesium: 1.4 mg/dL — ABNORMAL LOW (ref 1.7–2.4)

## 2015-04-15 MED ORDER — MAGNESIUM SULFATE 2 GM/50ML IV SOLN
2.0000 g | Freq: Once | INTRAVENOUS | Status: AC
  Administered 2015-04-15: 2 g via INTRAVENOUS
  Filled 2015-04-15: qty 50

## 2015-04-15 MED ORDER — INSULIN ASPART 100 UNIT/ML ~~LOC~~ SOLN
0.0000 [IU] | Freq: Three times a day (TID) | SUBCUTANEOUS | Status: DC
  Administered 2015-04-15: 3 [IU] via SUBCUTANEOUS
  Administered 2015-04-16: 1 [IU] via SUBCUTANEOUS
  Administered 2015-04-16 (×2): 2 [IU] via SUBCUTANEOUS
  Administered 2015-04-17 – 2015-04-19 (×3): 1 [IU] via SUBCUTANEOUS

## 2015-04-15 MED ORDER — ACETAMINOPHEN 325 MG PO TABS: 325.0000 mg | ORAL_TABLET | Freq: Four times a day (QID) | ORAL | Status: DC | PRN

## 2015-04-15 NOTE — Progress Notes (Signed)
[facility] TEAM 1 - Stepdown/ICU TEAM PROGRESS NOTE  Adam Butler OVZ:[id number] DOB: Dec 04, 1955 DOA: 04/13/2015 PCP: Antonietta Jewel, MD  Admit HPI / Brief Narrative: 59 year old male with PMH of cirrhosis secondary to NASH presented to [facility] ED 10/4 with c/o altered mental status. In ED he was hypotensive with elevated lactic acid. Also with fever. Concern for sepsis.  Significant Events: 10/4 admit  HPI/Subjective: Pt is awake and alert, sitting up in a bedside chair.  He denies cp, sob, n/v, or abdom pain.    Assessment/Plan:  Severe sepsis due to Healthcare Associated Pneumonia  Has defervesced - remains on empiric abx - no increased O2 requirement   Gram+ cocci bacteremia  Awaiting speciation and sensitivities - on Vanc presently - may require more extensive investigation for source depending upon species   Cirrhosis secondary to NASH - recurrent hepatic encephalopathy Ammonia 180 yesterday - recheck in AM - mental status much improved at this time    Acute Renal Failure crt presently normal   Mild hyponatremia Due to cirrhosis - stable   Hypomagnesemia  Replace and follow  Thrombocytopenia Declining - felt to be due to cirrhosis and splenic sequestration (splenomegally on CT abdom) - no evidence of gross blood loss - follow trend - avoid heparin products or ASA  Lactic Acidosis Resolved as of last check 10/4  DM - Early DKA  CBG now much better controlled - follow   Nephrolithiasis without obstruction CT renal stone study 10/4 unremarkable   CAD Quiescent   HTN Not an active issue at this time  Code Status: FULL Family Communication: no family present at time of exam Disposition Plan: transfer to med bed   Consultants: PCCM  Antibiotics: Zosyn 10/4 Tressie Ellis 10/4 > Azithro 10/4 > Vanc 10/4 >  DVT prophylaxis: SCDs  Objective: Blood pressure 96/45, pulse 66, temperature 97.7 F (36.5 C), temperature source Oral, resp. rate 18, height 5' 7"  (1.702  m), weight 126.1 kg (278 lb), SpO2 96 %.  Intake/Output Summary (Last 24 hours) at 04/15/15 4128 Last data filed at 04/15/15 0500  Gross per 24 hour  Intake   1830 ml  Output      0 ml  Net   1830 ml   Exam: General: No acute respiratory distress Lungs: Clear to auscultation bilaterally without wheezes or crackles Cardiovascular: Regular rate and rhythm without murmur gallop or rub normal S1 and S2 Abdomen: Nontender, protuberent, soft, bowel sounds positive, no rebound, no ascites, no appreciable mass Extremities: No significant cyanosis, or clubbing;  1+ edema bilateral lower extremities  Data Reviewed: Basic Metabolic Panel:  Recent Labs Lab 04/14/15 2347 04/15/15 0300  NA 132* 134*  K 4.2 3.5  CL 100* 103  CO2 22 24  GLUCOSE 295* 231*  BUN 9 6  CREATININE 1.05 0.72  CALCIUM 9.2 7.9*  MG  --  1.4*  PHOS  --  2.8    CBC:  Recent Labs Lab 04/14/15 2347 04/15/15 0300  WBC 9.6 3.0*  NEUTROABS  --  1.5*  HGB 13.8 11.6*  HCT 40.1 34.2*  MCV 100.0 100.9*  PLT 61* 32*    Liver Function Tests:  Recent Labs Lab 04/14/15 2347 04/15/15 0300  AST 67*  --   ALT 44  --   ALKPHOS 178*  --   BILITOT 2.3*  --   PROT 6.8  --   ALBUMIN 3.0* 2.3*    Recent Labs Lab 04/14/15 2347  AMMONIA 180*    Coags:  Recent Labs Lab 04/14/15 0105  INR 1.39    Recent Labs Lab 04/14/15 0105  APTT 33    Cardiac Enzymes:  Recent Labs Lab 04/14/15 0543 04/14/15 1112 04/14/15 1630  TROPONINI 0.06* 0.04* 0.03    CBG:  Recent Labs Lab 04/14/15 1541 04/14/15 1944 04/15/15 0016 04/15/15 0345 04/15/15 0802  GLUCAP 232* 230* 275* 208* 122*    Recent Results (from the past 240 hour(s))  Blood Culture (routine x 2)     Status: None (Preliminary result)   Collection Time: 04/13/15 11:45 PM  Result Value Ref Range Status   Specimen Description BLOOD RIGHT WRIST  Final   Special Requests BOTTLES DRAWN AEROBIC AND ANAEROBIC 10CC EA  Final   Culture  Setup  Time   Final    GRAM POSITIVE COCCI IN PAIRS IN BOTH AEROBIC AND ANAEROBIC BOTTLES CRITICAL RESULT CALLED TO, READ BACK BY AND VERIFIED WITH: D GREGORY,RN AT 1127 04/14/15 BY L BENFIELD    Culture GRAM POSITIVE COCCI  Final   Report Status PENDING  Incomplete  Blood Culture (routine x 2)     Status: None (Preliminary result)   Collection Time: 04/13/15 11:50 PM  Result Value Ref Range Status   Specimen Description BLOOD RIGHT HAND  Final   Special Requests BOTTLES DRAWN AEROBIC AND ANAEROBIC 5CC  Final   Culture  Setup Time   Final    GRAM POSITIVE COCCI IN PAIRS IN CHAINS IN BOTH AEROBIC AND ANAEROBIC BOTTLES CRITICAL RESULT CALLED TO, READ BACK BY AND VERIFIED WITH: Belenda Cruise RN 11:00 04/14/15 ([email or web address])    Culture GRAM POSITIVE COCCI  Final   Report Status PENDING  Incomplete  MRSA PCR Screening     Status: None   Collection Time: 04/14/15  6:47 AM  Result Value Ref Range Status   MRSA by PCR NEGATIVE NEGATIVE Final    Comment:        The GeneXpert MRSA Assay (FDA approved for NASAL specimens only), is one component of a comprehensive MRSA colonization surveillance program. It is not intended to diagnose MRSA infection nor to guide or monitor treatment for MRSA infections.      Studies:   Recent x-ray studies have been reviewed in detail by the Attending Physician  Scheduled Meds:  Scheduled Meds: . atorvastatin  10 mg Oral Daily  . azithromycin  500 mg Intravenous Q24H  . cefTAZidime (FORTAZ)  IV  2 g Intravenous 3 times per day  . heparin  5,000 Units Subcutaneous 3 times per day  . Influenza vac split quadrivalent PF  0.5 mL Intramuscular Tomorrow-1000  . insulin aspart  0-9 Units Subcutaneous 6 times per day  . insulin NPH Human  80 Units Subcutaneous QAC breakfast   And  . insulin NPH Human  50 Units Subcutaneous QHS  . lactulose  20 g Oral TID  . ranolazine  1,000 mg Oral BID  . rifaximin  550 mg Oral BID  . vancomycin  1,250 mg Intravenous Q12H    Time  spent on care of this patient: 35 mins   Marielis Samara T , MD   [facility] Office  [phone] Pager - Text Page per Shea Evans as per below:  On-Call/Text Page:      [email or web address]      password [email or web address]  If 7PM-7AM, please contact night-coverage [email or web address] Password [email or web address] 04/15/2015, 8:22 AM   LOS: 1 day

## 2015-04-16 ENCOUNTER — Inpatient Hospital Stay (HOSPITAL_COMMUNITY): Payer: PPO

## 2015-04-16 DIAGNOSIS — K7290 Hepatic failure, unspecified without coma: Secondary | ICD-10-CM

## 2015-04-16 DIAGNOSIS — R7881 Bacteremia: Secondary | ICD-10-CM

## 2015-04-16 DIAGNOSIS — N179 Acute kidney failure, unspecified: Secondary | ICD-10-CM

## 2015-04-16 DIAGNOSIS — B954 Other streptococcus as the cause of diseases classified elsewhere: Secondary | ICD-10-CM

## 2015-04-16 DIAGNOSIS — K0889 Other specified disorders of teeth and supporting structures: Secondary | ICD-10-CM

## 2015-04-16 DIAGNOSIS — K7581 Nonalcoholic steatohepatitis (NASH): Secondary | ICD-10-CM

## 2015-04-16 LAB — CULTURE, BLOOD (ROUTINE X 2)

## 2015-04-16 LAB — COMPREHENSIVE METABOLIC PANEL
ALBUMIN: 2.4 g/dL — AB (ref 3.5–5.0)
ALT: 45 U/L (ref 17–63)
AST: 65 U/L — AB (ref 15–41)
Alkaline Phosphatase: 126 U/L (ref 38–126)
Anion gap: 6 (ref 5–15)
BUN: 5 mg/dL — AB (ref 6–20)
CHLORIDE: 105 mmol/L (ref 101–111)
CO2: 23 mmol/L (ref 22–32)
CREATININE: 0.68 mg/dL (ref 0.61–1.24)
Calcium: 8.1 mg/dL — ABNORMAL LOW (ref 8.9–10.3)
GFR calc non Af Amer: >60 mL/min (ref >60)
Glucose, Bld: 136 mg/dL — ABNORMAL HIGH (ref 65–99)
Potassium: 3.7 mmol/L (ref 3.5–5.1)
SODIUM: 134 mmol/L — AB (ref 135–145)
Total Bilirubin: 1.4 mg/dL — ABNORMAL HIGH (ref 0.3–1.2)
Total Protein: 5.8 g/dL — ABNORMAL LOW (ref 6.5–8.1)

## 2015-04-16 LAB — GLUCOSE, CAPILLARY
GLUCOSE-CAPILLARY: 140 mg/dL — AB (ref 65–99)
GLUCOSE-CAPILLARY: 166 mg/dL — AB (ref 65–99)
Glucose-Capillary: 185 mg/dL — ABNORMAL HIGH (ref 65–99)
Glucose-Capillary: 159 mg/dL — ABNORMAL HIGH (ref 65–99)

## 2015-04-16 LAB — PROCALCITONIN: PROCALCITONIN: 0.11 ng/mL

## 2015-04-16 LAB — LEGIONELLA PNEUMOPHILA SEROGP 1 UR AG: L. pneumophila Serogp 1 Ur Ag: NEGATIVE

## 2015-04-16 LAB — MAGNESIUM: Magnesium: 1.7 mg/dL (ref 1.7–2.4)

## 2015-04-16 LAB — RESPIRATORY VIRUS PANEL
Adenovirus: NEGATIVE
INFLUENZA A: NEGATIVE
INFLUENZA B 1: NEGATIVE
Metapneumovirus: NEGATIVE
PARAINFLUENZA 1 A: NEGATIVE
PARAINFLUENZA 2 A: NEGATIVE
PARAINFLUENZA 3 A: NEGATIVE
Respiratory Syncytial Virus A: NEGATIVE
Respiratory Syncytial Virus B: NEGATIVE
Rhinovirus: NEGATIVE

## 2015-04-16 MED ORDER — POTASSIUM CHLORIDE CRYS ER 20 MEQ PO TBCR
40.0000 meq | EXTENDED_RELEASE_TABLET | Freq: Once | ORAL | Status: AC
  Administered 2015-04-16: 40 meq via ORAL
  Filled 2015-04-16: qty 2

## 2015-04-16 MED ORDER — MAGNESIUM SULFATE 4 GM/100ML IV SOLN
4.0000 g | Freq: Once | INTRAVENOUS | Status: AC
  Administered 2015-04-16: 4 g via INTRAVENOUS
  Filled 2015-04-16: qty 100

## 2015-04-16 MED ORDER — CEFAZOLIN SODIUM-DEXTROSE 2-3 GM-% IV SOLR
2.0000 g | Freq: Three times a day (TID) | INTRAVENOUS | Status: DC
  Administered 2015-04-16 – 2015-04-20 (×12): 2 g via INTRAVENOUS
  Filled 2015-04-16 (×13): qty 50

## 2015-04-16 NOTE — Progress Notes (Signed)
Physical Therapy Treatment Patient Details Name: Adam Butler MRN: [id number] DOB: [birth year] Today's Date: 04/16/2015    History of Present Illness 59 year old male with PMH of cirrhosis secondary to NASH presented to [facility] ED 10/4 with c/o altered mental status. In ED he was hypotensive with elevated lactic acid. Also with fever. Concern for sepsis, cholelithiasis, CT head (-), (+) blood cultures    PT Comments    Patient moving better today. Does very well pushing his IV pole. Recommend nursing assist if he ambulates without a device (although no loss of balance today, pt reports h/o several falls this year). Walks quickly and becomes SOB. Could benefit from OT consult (see below)  Follow Up Recommendations  Outpatient PT     Equipment Recommendations  None recommended by PT    Recommendations for Other Services OT consult (for energy conservation techniques with ADLs)     Precautions / Restrictions Precautions Precautions: Fall Precaution Comments: h/o several falls this year    Mobility  Bed Mobility               General bed mobility comments: in chair on arrival  Transfers Overall transfer level: Modified independent Equipment used: None                Ambulation/Gait Ambulation/Gait assistance: Min guard;Supervision;Modified independent (Device/Increase time) Ambulation Distance (Feet): 400 Feet Assistive device: None (holding IV pole vs none) Gait Pattern/deviations: WFL(Within Functional Limits) Gait velocity: less than normal, however limited by respiratory status   General Gait Details: progressed from minguard pt holding IV pole to modified independent; also supervision for safety with no device; no loss of balance with head turns this date   Stairs            Wheelchair Mobility    Modified Rankin (Stroke Patients Only)       Balance Overall balance assessment: Needs assistance;History of Falls         Standing balance  support: No upper extremity supported;During functional activity Standing balance-Leahy Scale: Good                      Cognition Arousal/Alertness: Awake/alert Behavior During Therapy: WFL for tasks assessed/performed Overall Cognitive Status: Within Functional Limits for tasks assessed                      Exercises      General Comments        Pertinent Vitals/Pain Pain Assessment: No/denies pain    Home Living                      Prior Function            PT Goals (current goals can now be found in the care plan section) Acute Rehab PT Goals Patient Stated Goal: be able to walk to the mailbox and go camping Time For Goal Achievement: 04/28/15 Progress towards PT goals: Progressing toward goals    Frequency  Min 3X/week    PT Plan Current plan remains appropriate    Co-evaluation             End of Session Equipment Utilized During Treatment: Gait belt Activity Tolerance: Patient tolerated treatment well Patient left: in chair;with call bell/phone within reach (up in chair on arrival; no chair alarm )     Time: 7793-9030 PT Time Calculation (min) (ACUTE ONLY): 14 min  Charges:  $Gait Training: 8-22 mins  G Codes:      Tuana Hoheisel 04/16/2015, 2:06 PM Pager [phone]

## 2015-04-16 NOTE — Consult Note (Signed)
[facility] for Infectious Disease  Total days of antibiotics 3        Day 10/4-10/6 Vancomycin, azithromycin, ceftazidime       Reason for Consult: GPC bacteremia    Referring Physician: Irine Seal  Principal Problem:   Sepsis [facility]) Active Problems:   Hepatic cirrhosis ([place])   Hepatic encephalopathy ([place])   Type 2 diabetes mellitus, uncontrolled ([place])   Thrombocytopenia ([place])   HCAP (healthcare-associated pneumonia)   Bacteremia   ARF (acute renal failure) ([place])    HPI: Adam Butler is a 59 y.o. male with NASH cirrhosis admitted 10/4 with AMS due to hepatic encephalopathy and sepsis, with fever and lactic acidosis on admission. He was on maintenance Bactrim for SBP ppx. Initial infectious source concerned for HCAP, UTI, SBP, large varices. Accompanied with rapid drop in WBCs, platelets. Vital signs and mental status rapidly improving with antibiotic therapy and supportive care. He is now on floor with alert and oriented mental status, vital signs stabilized, no additional fever. Blood cultures returned positive for gram positive cocci.  Past Medical History  Diagnosis Date  . Cirrhosis ([place]) 2011    Cryptogenic, Likely NASH. Family/pt deny EtOH. HCV, HBV, HAV negative. ANA negative. AMA positive. Ascites 12/11  . Hyperlipemia   . Coronary artery disease     Inferior MI 12/11; LHC with occluded mid CFX and 80% proximal RCA. EF 55%. He had 3.0 x 28 vision BMS to CFX  . Diastolic CHF, acute ([place])     Echo 12/11 with ef 50-55% and mild LVH. EF 55% by LV0gram in 12/11  . Hypertension   . Type II diabetes mellitus ([place]) 2011  . SVT (supraventricular tachycardia) ([place])     1/12: appeared to be an ectopic atrial tachycardia. Required DCCV with hemodynamic instability  . GI bleed     12/11: Etiology not clearly defined. EGD: nonbleeding esophageal varices.  . S/P coronary artery stent placement 06/2010  . Esophageal varices ([place]) 2011, 2013    no hx acute variceal  bleed  . Hepatic encephalopathy ([place]) 2011, 12/2013  . Myocardial infarction [facility]) ? 2012  . Shortness of breath dyspnea   . Arthritis   . Hx of echocardiogram     Echo 5/16:  EF 55-60%, no RWMA, mod LAE  . Thrombocytopenia ([place])   . Cholelithiasis   . Rectal varices     Allergies:  Allergies  Allergen Reactions  . Isosorbide     Nose bleeds    Current antibiotics:   MEDICATIONS: .  ceFAZolin (ANCEF) IV  2 g Intravenous 3 times per day  . insulin aspart  0-9 Units Subcutaneous TID WC  . insulin NPH Human  80 Units Subcutaneous QAC breakfast   And  . insulin NPH Human  50 Units Subcutaneous QHS  . lactulose  20 g Oral TID  . ranolazine  1,000 mg Oral BID  . rifaximin  550 mg Oral BID    Social History  Substance Use Topics  . Smoking status: Current Every Day Smoker -- 1.00 packs/day for 36 years    Types: Cigarettes, E-cigarettes  . Smokeless tobacco: Current User     Comment: uses vapor cigarettes (2016 )  . Alcohol Use: No    Family History  Problem Relation Age of Onset  . Heart attack Brother 68    MI  . Heart attack Brother 51    MI  . Heart attack Father   . Diabetes Father   . Diabetes  Brother   . COPD Mother   . Stroke Neg Hx     Review of Systems Review of Systems  Constitutional: Negative for fever.  Eyes: Negative for blurred vision.  Respiratory: Positive for cough and shortness of breath.   Cardiovascular: Negative for chest pain.  Gastrointestinal: Negative for nausea.  Genitourinary: Negative for dysuria.  Musculoskeletal: Negative for falls.  Skin: Negative for rash.  Neurological: Negative for dizziness and headaches.  Endo/Heme/Allergies: Negative for polydipsia.  Psychiatric/Behavioral: The patient does not have insomnia.     OBJECTIVE: Temp:  [97.8 F (36.6 C)-98.2 F (36.8 C)] 98.2 F (36.8 C) (10/06 1500) Pulse Rate:  [67-114] 69 (10/06 1500) Resp:  [13-18] 18 (10/06 1500) BP: (116-141)/(49-68) 126/64 mmHg (10/06  1500) SpO2:  [90 %-100 %] 98 % (10/06 1500) Weight:  [134.3 kg (296 lb 1.2 oz)] 134.3 kg (296 lb 1.2 oz) (10/06 0136)   GENERAL- obese, alert, co-operative, NAD HEENT- Atraumatic, PERRL, oral mucosa appears moist, extremely poor dentition with multiple partially eroded teeth CARDIAC- RRR, no murmurs, rubs or gallops. RESP- CTAB, no wheezes or crackles. ABDOMEN- Soft, nontender, no guarding or rebound, ecchymoses present around left lower edge of abdomen BACK- Normal curvature, no paraspinal tenderness, no CVA tenderness. EXTREMITIES- symmetric, 2+ pedal edema. SKIN- Warm, dry   LABS: Results for orders placed or performed during the hospital encounter of 04/13/15 (from the past 48 hour(s))  Glucose, capillary     Status: Abnormal   Collection Time: 04/14/15  7:44 PM  Result Value Ref Range   Glucose-Capillary 230 (H) 65 - 99 mg/dL  Lactic acid, plasma     Status: None   Collection Time: 04/14/15 10:15 PM  Result Value Ref Range   Lactic Acid, Venous 1.5 0.5 - 2.0 mmol/L  Comprehensive metabolic panel     Status: Abnormal   Collection Time: 04/14/15 11:47 PM  Result Value Ref Range   Sodium 132 (L) 135 - 145 mmol/L   Potassium 4.2 3.5 - 5.1 mmol/L   Chloride 100 (L) 101 - 111 mmol/L   CO2 22 22 - 32 mmol/L   Glucose, Bld 295 (H) 65 - 99 mg/dL   BUN 9 6 - 20 mg/dL   Creatinine, Ser 1.05 0.61 - 1.24 mg/dL   Calcium 9.2 8.9 - 10.3 mg/dL   Total Protein 6.8 6.5 - 8.1 g/dL   Albumin 3.0 (L) 3.5 - 5.0 g/dL   AST 67 (H) 15 - 41 U/L   ALT 44 17 - 63 U/L   Alkaline Phosphatase 178 (H) 38 - 126 U/L   Total Bilirubin 2.3 (H) 0.3 - 1.2 mg/dL   GFR calc non Af Amer >60 >60 mL/min   GFR calc Af Amer >60 >60 mL/min    Comment: (NOTE) The eGFR has been calculated using the CKD EPI equation. This calculation has not been validated in all clinical situations. eGFR's persistently <60 mL/min signify possible Chronic Kidney Disease.    Anion gap 10 5 - 15  CBC     Status: Abnormal    Collection Time: 04/14/15 11:47 PM  Result Value Ref Range   WBC 9.6 4.0 - 10.5 K/uL   RBC 4.01 (L) 4.22 - 5.81 MIL/uL   Hemoglobin 13.8 13.0 - 17.0 g/dL   HCT 40.1 39.0 - 52.0 %   MCV 100.0 78.0 - 100.0 fL   MCH 34.4 (H) 26.0 - 34.0 pg   MCHC 34.4 30.0 - 36.0 g/dL   RDW 13.6 11.5 - 15.5 %  Platelets 61 (L) 150 - 400 K/uL    Comment: CONSISTENT WITH PREVIOUS RESULT  Ammonia     Status: Abnormal   Collection Time: 04/14/15 11:47 PM  Result Value Ref Range   Ammonia 180 (H) 9 - 35 umol/L  Glucose, capillary     Status: Abnormal   Collection Time: 04/15/15 12:16 AM  Result Value Ref Range   Glucose-Capillary 275 (H) 65 - 99 mg/dL  Procalcitonin     Status: None   Collection Time: 04/15/15  3:00 AM  Result Value Ref Range   Procalcitonin 0.15 ng/mL    Comment:        Interpretation: PCT (Procalcitonin) <= 0.5 ng/mL: Systemic infection (sepsis) is not likely. Local bacterial infection is possible. (NOTE)         ICU PCT Algorithm               Non ICU PCT Algorithm    ----------------------------     ------------------------------         PCT < 0.25 ng/mL                 PCT < 0.1 ng/mL     Stopping of antibiotics            Stopping of antibiotics       strongly encouraged.               strongly encouraged.    ----------------------------     ------------------------------       PCT level decrease by               PCT < 0.25 ng/mL       >= 80% from peak PCT       OR PCT 0.25 - 0.5 ng/mL          Stopping of antibiotics                                             encouraged.     Stopping of antibiotics           encouraged.    ----------------------------     ------------------------------       PCT level decrease by              PCT >= 0.25 ng/mL       < 80% from peak PCT        AND PCT >= 0.5 ng/mL            Continuin g antibiotics                                              encouraged.       Continuing antibiotics            encouraged.     ----------------------------     ------------------------------     PCT level increase compared          PCT > 0.5 ng/mL         with peak PCT AND          PCT >= 0.5 ng/mL             Escalation of antibiotics  strongly encouraged.      Escalation of antibiotics        strongly encouraged.   Magnesium     Status: Abnormal   Collection Time: 04/15/15  3:00 AM  Result Value Ref Range   Magnesium 1.4 (L) 1.7 - 2.4 mg/dL  CBC with Differential/Platelet     Status: Abnormal   Collection Time: 04/15/15  3:00 AM  Result Value Ref Range   WBC 3.0 (L) 4.0 - 10.5 K/uL   RBC 3.39 (L) 4.22 - 5.81 MIL/uL   Hemoglobin 11.6 (L) 13.0 - 17.0 g/dL   HCT 34.2 (L) 39.0 - 52.0 %   MCV 100.9 (H) 78.0 - 100.0 fL   MCH 34.2 (H) 26.0 - 34.0 pg   MCHC 33.9 30.0 - 36.0 g/dL   RDW 14.1 11.5 - 15.5 %   Platelets 32 (L) 150 - 400 K/uL    Comment: REPEATED TO VERIFY CONSISTENT WITH PREVIOUS RESULT    Neutrophils Relative % 49 %   Neutro Abs 1.5 (L) 1.7 - 7.7 K/uL   Lymphocytes Relative 27 %   Lymphs Abs 0.8 0.7 - 4.0 K/uL   Monocytes Relative 20 %   Monocytes Absolute 0.6 0.1 - 1.0 K/uL   Eosinophils Relative 3 %   Eosinophils Absolute 0.1 0.0 - 0.7 K/uL   Basophils Relative 0 %   Basophils Absolute 0.0 0.0 - 0.1 K/uL  Basic metabolic panel     Status: Abnormal   Collection Time: 04/15/15  3:00 AM  Result Value Ref Range   Sodium 134 (L) 135 - 145 mmol/L   Potassium 3.5 3.5 - 5.1 mmol/L   Chloride 103 101 - 111 mmol/L   CO2 24 22 - 32 mmol/L   Glucose, Bld 231 (H) 65 - 99 mg/dL   BUN 6 6 - 20 mg/dL   Creatinine, Ser 0.72 0.61 - 1.24 mg/dL   Calcium 7.9 (L) 8.9 - 10.3 mg/dL   GFR calc non Af Amer >60 >60 mL/min   GFR calc Af Amer >60 >60 mL/min    Comment: (NOTE) The eGFR has been calculated using the CKD EPI equation. This calculation has not been validated in all clinical situations. eGFR's persistently <60 mL/min signify possible Chronic  Kidney Disease.    Anion gap 7 5 - 15  Phosphorus     Status: None   Collection Time: 04/15/15  3:00 AM  Result Value Ref Range   Phosphorus 2.8 2.5 - 4.6 mg/dL  Albumin     Status: Abnormal   Collection Time: 04/15/15  3:00 AM  Result Value Ref Range   Albumin 2.3 (L) 3.5 - 5.0 g/dL  Glucose, capillary     Status: Abnormal   Collection Time: 04/15/15  3:45 AM  Result Value Ref Range   Glucose-Capillary 208 (H) 65 - 99 mg/dL  Glucose, capillary     Status: Abnormal   Collection Time: 04/15/15  8:02 AM  Result Value Ref Range   Glucose-Capillary 122 (H) 65 - 99 mg/dL  Glucose, capillary     Status: Abnormal   Collection Time: 04/15/15 12:10 PM  Result Value Ref Range   Glucose-Capillary 186 (H) 65 - 99 mg/dL  Strep pneumoniae urinary antigen     Status: None   Collection Time: 04/15/15  2:49 PM  Result Value Ref Range   Strep Pneumo Urinary Antigen NEGATIVE NEGATIVE    Comment:        Infection due to S. pneumoniae cannot be absolutely ruled out  since the antigen present may be below the detection limit of the test.   Legionella Pneumophila Serogp 1 Ur Ag     Status: None   Collection Time: 04/15/15  2:50 PM  Result Value Ref Range   L. pneumophila Serogp 1 Ur Ag Negative Negative    Comment: (NOTE) Performed At: [facility] [address] [place], [place] [id number] Lindon Romp MD QV:[id number]    Source of Sample URINE, RANDOM   Glucose, capillary     Status: Abnormal   Collection Time: 04/15/15  4:48 PM  Result Value Ref Range   Glucose-Capillary 240 (H) 65 - 99 mg/dL  Glucose, capillary     Status: Abnormal   Collection Time: 04/15/15  9:28 PM  Result Value Ref Range   Glucose-Capillary 183 (H) 65 - 99 mg/dL  Glucose, capillary     Status: Abnormal   Collection Time: 04/16/15  6:37 AM  Result Value Ref Range   Glucose-Capillary 140 (H) 65 - 99 mg/dL  Procalcitonin     Status: None   Collection Time: 04/16/15  7:10 AM  Result Value Ref Range    Procalcitonin 0.11 ng/mL    Comment:        Interpretation: PCT (Procalcitonin) <= 0.5 ng/mL: Systemic infection (sepsis) is not likely. Local bacterial infection is possible. (NOTE)         ICU PCT Algorithm               Non ICU PCT Algorithm    ----------------------------     ------------------------------         PCT < 0.25 ng/mL                 PCT < 0.1 ng/mL     Stopping of antibiotics            Stopping of antibiotics       strongly encouraged.               strongly encouraged.    ----------------------------     ------------------------------       PCT level decrease by               PCT < 0.25 ng/mL       >= 80% from peak PCT       OR PCT 0.25 - 0.5 ng/mL          Stopping of antibiotics                                             encouraged.     Stopping of antibiotics           encouraged.    ----------------------------     ------------------------------       PCT level decrease by              PCT >= 0.25 ng/mL       < 80% from peak PCT        AND PCT >= 0.5 ng/mL            Continuin g antibiotics                                              encouraged.  Continuing antibiotics            encouraged.    ----------------------------     ------------------------------     PCT level increase compared          PCT > 0.5 ng/mL         with peak PCT AND          PCT >= 0.5 ng/mL             Escalation of antibiotics                                          strongly encouraged.      Escalation of antibiotics        strongly encouraged.   Comprehensive metabolic panel     Status: Abnormal   Collection Time: 04/16/15  7:10 AM  Result Value Ref Range   Sodium 134 (L) 135 - 145 mmol/L   Potassium 3.7 3.5 - 5.1 mmol/L   Chloride 105 101 - 111 mmol/L   CO2 23 22 - 32 mmol/L   Glucose, Bld 136 (H) 65 - 99 mg/dL   BUN 5 (L) 6 - 20 mg/dL   Creatinine, Ser 0.68 0.61 - 1.24 mg/dL   Calcium 8.1 (L) 8.9 - 10.3 mg/dL   Total Protein 5.8 (L) 6.5 - 8.1 g/dL   Albumin  2.4 (L) 3.5 - 5.0 g/dL   AST 65 (H) 15 - 41 U/L   ALT 45 17 - 63 U/L   Alkaline Phosphatase 126 38 - 126 U/L   Total Bilirubin 1.4 (H) 0.3 - 1.2 mg/dL   GFR calc non Af Amer >60 >60 mL/min   GFR calc Af Amer >60 >60 mL/min    Comment: (NOTE) The eGFR has been calculated using the CKD EPI equation. This calculation has not been validated in all clinical situations. eGFR's persistently <60 mL/min signify possible Chronic Kidney Disease.    Anion gap 6 5 - 15  Magnesium     Status: None   Collection Time: 04/16/15  7:10 AM  Result Value Ref Range   Magnesium 1.7 1.7 - 2.4 mg/dL  Glucose, capillary     Status: Abnormal   Collection Time: 04/16/15 11:51 AM  Result Value Ref Range   Glucose-Capillary 185 (H) 65 - 99 mg/dL  Glucose, capillary     Status: Abnormal   Collection Time: 04/16/15  4:56 PM  Result Value Ref Range   Glucose-Capillary 159 (H) 65 - 99 mg/dL    MICRO:  IMAGING: Dg Orthopantogram  04/16/2015   CLINICAL DATA:  Poor dentition.  Bacteremia.  EXAM: ORTHOPANTOGRAM/PANORAMIC  COMPARISON:  None.  FINDINGS: Patient is missing multiple teeth including the upper central incisors. There is concern for large caries involving the right upper lateral incisor. There may also be large caries involving the left upper bicuspid. The lower central incisors may be broken but the roots appear to be present. Concern for caries involving the left lower lateral incisor. The molars have been removed. There may be a small molar remnant on the upper left. Mandible is intact. There may be mild periapical lucency involving the remnants of the lower central incisors.  IMPRESSION: Concern for multiple caries.  Subtle periapical lucency involving the lower central incisors.   Electronically Signed   By: Markus Daft M.D.   On: 04/16/2015 17:21    HISTORICAL MICRO/IMAGING  Assessment/Plan:  GPC Bacteremia Now speciated to Strep Viridans. Most likely source for this is through large dental caries.  On discussion with the patient, these have progressed recently with loose teeth and portions of enamel broken away during regular eating and brushing. He does not appear to have any localized cellulitis around the mouth and no cervical lymphadenopathy. Panorax obtained consistent with initial exam impression. Entire other workup fairly unimpressive as infectious source with not the most clear portable CXR, head CT grossly normal for acute change. Left nephrolithiasis and cholelithiasis noted but minimal abdominal exam tenderness.  -Change abtx to cefazolin -Droplet precautions not strongly indicated by current findings -Needs rule out endocarditis, TEE maybe inappropriate due to current dental status but certainly TTE appropriate    Hinton Lovely Internal Medicine Resident PGY-I Pager: [phone]

## 2015-04-16 NOTE — Progress Notes (Signed)
[facility] PROGRESS NOTE  Adam Butler YJE:[id number] DOB: 03/14/1956 DOA: 04/13/2015 PCP: Antonietta Jewel, MD  Admit HPI / Brief Narrative: 59 year old male with PMH of cirrhosis secondary to NASH presented to [facility] ED 10/4 with c/o altered mental status. In ED he was hypotensive with elevated lactic acid. Also with fever. Concern for sepsis.  Assessment/Plan: Severe sepsis due to Healthcare Associated Pneumonia  Has defervesced - remains on empiric abx - no increased O2 requirement. WBC at 3.0. IV antibiotics have been narrowed down to IV Ancef per ID. ID to be consulted.  Gram+ cocci bacteremia  Awaiting speciation and sensitivities - on Vanc presently - may require more extensive investigation for source depending upon species.  Will consult with ID for further evaluation and management. IV antibiotics have been narrowed down to IV Ancef per ID.  Cirrhosis secondary to NASH - recurrent hepatic encephalopathy Ammonia 180 - recheck in AM - mental status much improved at this time   Acute Renal Failure crt presently normal   Mild hyponatremia Due to cirrhosis - stable   Hypomagnesemia  Replace and follow  Thrombocytopenia Declining - felt to be due to cirrhosis and splenic sequestration (splenomegally on CT abdom) - no evidence of gross blood loss - follow trend - avoid heparin products or ASA  Lactic Acidosis Resolved as of last check 10/4  DM - Early DKA  Hemoglobin A1c is 8.2. CBGs have improved. CBG ranging from 140-185. Continue NPH 80 units every morning 50 units daily at bedtime. Sliding scale insulin.  Nephrolithiasis without obstruction CT renal stone study 10/4 unremarkable   CAD Stable  HTN Stable.  Code Status: Full Family Communication: Updated patient. No family at bedside. Disposition Plan:Once medically stable over a 1-2 days.   Consultants:  PCCM admitted  Procedures:  CT head 04/14/2015   Chest x-ray  04/14/2015    Antibiotics:  IV Fortaz 04/14/2015>>>> 04/16/2015  IV vancomycin 04/14/2015>>>> 04/16/2015  IV Ancef 04/16/2015  Azithromycin 04/14/2015  HPI/Subjective: Patient states he's feeling better. Patient denies any shortness of breath. No chest pain. Patient asking when he can go home. Patient denies any active bleeding.  Objective: Filed Vitals:   04/16/15 0456  BP: 141/55  Pulse: 73  Temp: 98 F (36.7 C)  Resp: 18    Intake/Output Summary (Last 24 hours) at 04/16/15 1617 Last data filed at 04/16/15 0641  Gross per 24 hour  Intake    540 ml  Output    325 ml  Net    215 ml   Filed Weights   04/13/15 2325 04/14/15 0644 04/16/15 0136  Weight: 127.914 kg (282 lb) 126.1 kg (278 lb) 134.3 kg (296 lb 1.2 oz)    Exam:   General:  NAD  Cardiovascular: RRR  Respiratory: CTAB  Abdomen: Soft/NT/ND/+BS  Musculoskeletal: No c/c/e  Data Reviewed: Basic Metabolic Panel:  Recent Labs Lab 04/14/15 2347 04/15/15 0300 04/16/15 0710  NA 132* 134* 134*  K 4.2 3.5 3.7  CL 100* 103 105  CO2 22 24 23   GLUCOSE 295* 231* 136*  BUN 9 6 5*  CREATININE 1.05 0.72 0.68  CALCIUM 9.2 7.9* 8.1*  MG  --  1.4* 1.7  PHOS  --  2.8  --    Liver Function Tests:  Recent Labs Lab 04/14/15 2347 04/15/15 0300 04/16/15 0710  AST 67*  --  65*  ALT 44  --  45  ALKPHOS 178*  --  126  BILITOT 2.3*  --  1.4*  PROT  6.8  --  5.8*  ALBUMIN 3.0* 2.3* 2.4*   No results for input(s): LIPASE, AMYLASE in the last 168 hours.  Recent Labs Lab 04/14/15 2347  AMMONIA 180*   CBC:  Recent Labs Lab 04/14/15 2347 04/15/15 0300  WBC 9.6 3.0*  NEUTROABS  --  1.5*  HGB 13.8 11.6*  HCT 40.1 34.2*  MCV 100.0 100.9*  PLT 61* 32*   Cardiac Enzymes:  Recent Labs Lab 04/14/15 0543 04/14/15 1112 04/14/15 1630  TROPONINI 0.06* 0.04* 0.03   BNP (last 3 results)  Recent Labs  10/14/14 2248  BNP 56.1    ProBNP (last 3 results)  Recent Labs  12/11/14 0913  PROBNP  38.0    CBG:  Recent Labs Lab 04/15/15 1210 04/15/15 1648 04/15/15 2128 04/16/15 0637 04/16/15 1151  GLUCAP 186* 240* 183* 140* 185*    Recent Results (from the past 240 hour(s))  Blood Culture (routine x 2)     Status: None (Preliminary result)   Collection Time: 04/13/15 11:45 PM  Result Value Ref Range Status   Specimen Description BLOOD RIGHT WRIST  Final   Special Requests BOTTLES DRAWN AEROBIC AND ANAEROBIC 10CC EA  Final   Culture  Setup Time   Final    GRAM POSITIVE COCCI IN PAIRS IN BOTH AEROBIC AND ANAEROBIC BOTTLES CRITICAL RESULT CALLED TO, READ BACK BY AND VERIFIED WITH: D GREGORY,RN AT 1127 04/14/15 BY L BENFIELD    Culture GRAM POSITIVE COCCI  Final   Report Status PENDING  Incomplete  Blood Culture (routine x 2)     Status: None   Collection Time: 04/13/15 11:50 PM  Result Value Ref Range Status   Specimen Description BLOOD RIGHT HAND  Final   Special Requests BOTTLES DRAWN AEROBIC AND ANAEROBIC 5CC  Final   Culture  Setup Time   Final    GRAM POSITIVE COCCI IN PAIRS IN CHAINS IN BOTH AEROBIC AND ANAEROBIC BOTTLES CRITICAL RESULT CALLED TO, READ BACK BY AND VERIFIED WITH: Belenda Cruise RN 11:00 04/14/15 ([email or web address])    Culture   Final    VIRIDANS STREPTOCOCCUS SUSCEPTIBILITIES PERFORMED ON PREVIOUS CULTURE WITHIN THE LAST 5 DAYS.    Report Status 04/16/2015 FINAL  Final  Urine culture     Status: None   Collection Time: 04/14/15 12:30 AM  Result Value Ref Range Status   Specimen Description URINE, RANDOM  Final   Special Requests NONE  Final   Culture MULTIPLE SPECIES PRESENT, SUGGEST RECOLLECTION  Final   Report Status 04/15/2015 FINAL  Final  MRSA PCR Screening     Status: None   Collection Time: 04/14/15  6:47 AM  Result Value Ref Range Status   MRSA by PCR NEGATIVE NEGATIVE Final    Comment:        The GeneXpert MRSA Assay (FDA approved for NASAL specimens only), is one component of a comprehensive MRSA colonization surveillance program. It is  not intended to diagnose MRSA infection nor to guide or monitor treatment for MRSA infections.      Studies: No results found.  Scheduled Meds: .  ceFAZolin (ANCEF) IV  2 g Intravenous 3 times per day  . insulin aspart  0-9 Units Subcutaneous TID WC  . insulin NPH Human  80 Units Subcutaneous QAC breakfast   And  . insulin NPH Human  50 Units Subcutaneous QHS  . lactulose  20 g Oral TID  . ranolazine  1,000 mg Oral BID  . rifaximin  550 mg Oral BID   Continuous  Infusions:   Principal Problem:   Sepsis ([place]) Active Problems:   Bacteremia   Hepatic cirrhosis (HCC)   Hepatic encephalopathy (HCC)   Type 2 diabetes mellitus, uncontrolled ([place])   Thrombocytopenia ([place])   HCAP (healthcare-associated pneumonia)   ARF (acute renal failure) ([place])    Time spent: 58 minutes    Nachman Sundt M.D. [facility] Pager [phone]. If 7PM-7AM, please contact night-coverage at [email or web address], password [facility] 04/16/2015, 4:17 PM  LOS: 2 days

## 2015-04-17 ENCOUNTER — Inpatient Hospital Stay (HOSPITAL_COMMUNITY): Payer: PPO

## 2015-04-17 DIAGNOSIS — I5032 Chronic diastolic (congestive) heart failure: Secondary | ICD-10-CM

## 2015-04-17 DIAGNOSIS — I339 Acute and subacute endocarditis, unspecified: Secondary | ICD-10-CM

## 2015-04-17 LAB — GLUCOSE, CAPILLARY
GLUCOSE-CAPILLARY: 63 mg/dL — AB (ref 65–99)
GLUCOSE-CAPILLARY: 105 mg/dL — AB (ref 65–99)
GLUCOSE-CAPILLARY: 129 mg/dL — AB (ref 65–99)
Glucose-Capillary: 118 mg/dL — ABNORMAL HIGH (ref 65–99)
Glucose-Capillary: 129 mg/dL — ABNORMAL HIGH (ref 65–99)

## 2015-04-17 LAB — CBC WITH DIFFERENTIAL/PLATELET
Basophils Absolute: 0 10*3/uL (ref 0.0–0.1)
Basophils Relative: 0 %
EOS PCT: 6 %
Eosinophils Absolute: 0.2 10*3/uL (ref 0.0–0.7)
HEMATOCRIT: 34.9 % — AB (ref 39.0–52.0)
HEMOGLOBIN: 12.1 g/dL — AB (ref 13.0–17.0)
LYMPHS ABS: 1 10*3/uL (ref 0.7–4.0)
LYMPHS PCT: 27 %
MCH: 35 pg — AB (ref 26.0–34.0)
MCHC: 34.7 g/dL (ref 30.0–36.0)
MCV: 100.9 fL — AB (ref 78.0–100.0)
Monocytes Absolute: 0.6 10*3/uL (ref 0.1–1.0)
Monocytes Relative: 16 %
NEUTROS ABS: 1.8 10*3/uL (ref 1.7–7.7)
Neutrophils Relative %: 51 %
PLATELETS: 43 10*3/uL — AB (ref 150–400)
RBC: 3.46 MIL/uL — AB (ref 4.22–5.81)
RDW: 14 % (ref 11.5–15.5)
WBC: 3.6 10*3/uL — AB (ref 4.0–10.5)

## 2015-04-17 LAB — BASIC METABOLIC PANEL
Anion gap: 10 (ref 5–15)
CHLORIDE: 104 mmol/L (ref 101–111)
CO2: 24 mmol/L (ref 22–32)
Calcium: 8.3 mg/dL — ABNORMAL LOW (ref 8.9–10.3)
Creatinine, Ser: 0.71 mg/dL (ref 0.61–1.24)
GFR calc Af Amer: >60 mL/min (ref >60)
GFR calc non Af Amer: >60 mL/min (ref >60)
GLUCOSE: 82 mg/dL (ref 65–99)
POTASSIUM: 3.6 mmol/L (ref 3.5–5.1)
Sodium: 138 mmol/L (ref 135–145)

## 2015-04-17 LAB — MAGNESIUM: Magnesium: 1.9 mg/dL (ref 1.7–2.4)

## 2015-04-17 MED ORDER — INSULIN NPH (HUMAN) (ISOPHANE) 100 UNIT/ML ~~LOC~~ SUSP
40.0000 [IU] | Freq: Every day | SUBCUTANEOUS | Status: DC
  Administered 2015-04-18: 40 [IU] via SUBCUTANEOUS
  Filled 2015-04-17: qty 10

## 2015-04-17 MED ORDER — INSULIN NPH (HUMAN) (ISOPHANE) 100 UNIT/ML ~~LOC~~ SUSP
80.0000 [IU] | Freq: Every day | SUBCUTANEOUS | Status: DC
  Administered 2015-04-18: 80 [IU] via SUBCUTANEOUS
  Filled 2015-04-17: qty 10

## 2015-04-17 MED ORDER — FUROSEMIDE 40 MG PO TABS
40.0000 mg | ORAL_TABLET | Freq: Every day | ORAL | Status: DC
  Administered 2015-04-17 – 2015-04-20 (×4): 40 mg via ORAL
  Filled 2015-04-17 (×4): qty 1

## 2015-04-17 NOTE — Care Management Note (Signed)
Case Management Note CM note started by Ellan Lambert RNCM  Patient Details  Name: Adam Butler MRN: [id number] Date of Birth: May 05, 1956  Subjective/Objective:    Pt admitted on 04/13/15 with HCAP, sepsis.  PTA, pt independent, lives with spouse.              Action/Plan: Will follow for discharge planning as pt progresses.  PT recommending no OP follow up.    Expected Discharge Date:                  Expected Discharge Plan:  [place]  In-House Referral:     Discharge planning Services  CM Consult  Post Acute Care Choice:    Choice offered to:     DME Arranged:    DME Agency:     HH Arranged:    [place] Agency:     Status of Service:  In process, will continue to follow  Medicare Important Message Given:  Yes-second notification given Date Medicare IM Given:    Medicare IM give by:    Date Additional Medicare IM Given:    Additional Medicare Important Message give by:     If discussed at [place] of Stay Meetings, dates discussed:    Additional Comments:  04/17/15- pt with positive blood cultures- will need PICC line placed and course of IV abx- NCM to follow for [facility] needs and IV abx arrangements once orders placed

## 2015-04-17 NOTE — Progress Notes (Signed)
[place] for Infectious Disease    Date of Admission:  04/13/2015   Total days of antibiotics 5        Day 5 rifaximin        Day 2 cefazolin           ID: Adam Butler is a 59 y.o. male with cirrhosis admitted for sepsis found to have viridans strep bacteremia Principal Problem:   Sepsis ([place]) Active Problems:   Hepatic cirrhosis ([place])   Hepatic encephalopathy ([place])   Type 2 diabetes mellitus, uncontrolled ([place])   Thrombocytopenia ([place])   HCAP (healthcare-associated pneumonia)   Bacteremia   ARF (acute renal failure) (HCC)    Subjective: Afebrile, feeling better today. Ambulating without difficulty. Patient had TTE but poor visualization of his valve  Medications:  .  ceFAZolin (ANCEF) IV  2 g Intravenous 3 times per day  . furosemide  40 mg Oral Daily  . insulin aspart  0-9 Units Subcutaneous TID WC  . insulin NPH Human  40 Units Subcutaneous QHS   And  . [START ON 04/18/2015] insulin NPH Human  80 Units Subcutaneous QAC breakfast  . lactulose  20 g Oral TID  . ranolazine  1,000 mg Oral BID  . rifaximin  550 mg Oral BID    Objective: Vital signs in last 24 hours: Temp:  [97.4 F (36.3 C)-98.7 F (37.1 C)] 98.7 F (37.1 C) (10/07 1500) Pulse Rate:  [68-86] 86 (10/07 1500) Resp:  [16-18] 18 (10/07 1500) BP: (135-153)/(51-70) 153/70 mmHg (10/07 1500) SpO2:  [99 %-100 %] 99 % (10/07 1500) Weight:  [295 lb 3.1 oz (133.9 kg)] 295 lb 3.1 oz (133.9 kg) (10/07 0353) Physical Exam  Constitutional: He is oriented to person, place, and time. He appears well-developed and well-nourished. No distress.  HENT:  Mouth/Throat: Oropharynx is clear and moist. No oropharyngeal exudate. Poor dentition Cardiovascular: Normal rate, regular rhythm and normal heart sounds. Exam reveals no gallop and no friction rub.  No murmur heard.  Pulmonary/Chest: Effort normal and breath sounds normal. No respiratory distress. He has no wheezes.  Abdominal: Soft. Bowel sounds are  normal. He exhibits no distension. There is no tenderness. Protuberant. No fluid wave Lymphadenopathy:  He has no cervical adenopathy.  Neurological: He is alert and oriented to person, place, and time.  Skin: Skin is warm and dry. No rash noted. No erythema.  Psychiatric: He has a normal mood and affect. His behavior is normal.     Lab Results  Recent Labs  04/15/15 0300 04/16/15 0710 04/17/15 0410  WBC 3.0*  --  3.6*  HGB 11.6*  --  12.1*  HCT 34.2*  --  34.9*  NA 134* 134* 138  K 3.5 3.7 3.6  CL 103 105 104  CO2 24 23 24   BUN 6 5* <5*  CREATININE 0.72 0.68 0.71   Liver Panel  Recent Labs  04/14/15 2347 04/15/15 0300 04/16/15 0710  PROT 6.8  --  5.8*  ALBUMIN 3.0* 2.3* 2.4*  AST 67*  --  65*  ALT 44  --  45  ALKPHOS 178*  --  126  BILITOT 2.3*  --  1.4*    Microbiology: 10/7 blood cx ngtd 10/3 blood cx viridans strep group 2 of 2 + Studies/Results: Dg Orthopantogram  04/16/2015   CLINICAL DATA:  Poor dentition.  Bacteremia.  EXAM: ORTHOPANTOGRAM/PANORAMIC  COMPARISON:  None.  FINDINGS: Patient is missing multiple teeth including the upper central incisors. There is concern for  large caries involving the right upper lateral incisor. There may also be large caries involving the left upper bicuspid. The lower central incisors may be broken but the roots appear to be present. Concern for caries involving the left lower lateral incisor. The molars have been removed. There may be a small molar remnant on the upper left. Mandible is intact. There may be mild periapical lucency involving the remnants of the lower central incisors.  IMPRESSION: Concern for multiple caries.  Subtle periapical lucency involving the lower central incisors.   Electronically Signed   By: Markus Daft M.D.   On: 04/16/2015 17:21     Assessment/Plan:  viridans strep bacteremia = likely due to poor dentition. Will treat as complicated bacteremia due to sepsis on admit. Currently on cefazolin while  as inpatient. Can switch to ceftriaxone 2gm iv daily as outpatient to finish out course. Use 10/7 as day 1 due to documentation of clearance of bacteremia. Can likely place picc line tomorrow or Sunday.TTE did not show vegetations, but limited study. Not sure if he can do TEE. If repeat blood cx are positive, then extend tx for 6 wk.  Will follow up with patient as outpatient  Dr Linus Salmons available for questions over the weekend. Will see patient back on monday  Pama Roskos, [facility] for Infectious Diseases Cell: [phone] Pager: [phone]  04/17/2015, 6:49 PM

## 2015-04-17 NOTE — Progress Notes (Signed)
PT Discharge Note  Patient Details Name: Adam Butler MRN: [id number] DOB: October 06, 1955   Cancelled Treatment:    Reason Treat Not Completed: PT screened, no needs identified, will sign off  Noted patient ambulating independently on nursing unit throughout the day. No device, no imbalance even with head turns, pivots, etc.    Patient is being discharged from PT services secondary to:  Goals met and no further therapy needs identified.    Please see latest Therapy Progress Note for current level of functioning and progress toward goals.  Progress and discharge plan and discussed with patient/caregiver and they  Agree  04/17/2015    Schneider Warchol 04/17/2015, 4:14 PM  Pager [phone]

## 2015-04-17 NOTE — Consult Note (Addendum)
   [facility] CM Inpatient Consult   04/17/2015  ALPHA MYSLIWIEC 07/20/1955 [id number]   [facility] Care Management referral received. Went to speak with patient to confirm his Primary Care MD. He states his current Primary is Dr. Sheryle Hail who is not a [facility] Provider. Mr. Giovanetti states he is looking to change to [facility] as it is closer to his home. Assisted Mr. Huebert with making an appointment with Virgel Manifold to establish Primary Care and for hospital follow up. Appointment was made for Oct. 13th at 315pm with [name], NP. Mr. Cruces appreciative of assistance. Appointment time and date written down and given to patient. Also placed follow up appointment in [facility] discharge section.   Discussed [facility] Care Management services. Mr. Geno agreeable and consents signed. Explained that he will receive post hospital discharge calls and will be evaluated for monthly home visits. Made him aware that [facility] will not interfere or replace services that may be provided by home health if he has home health arranged at discharge. Patient lives with his wife. Mr. Montefusco denies having issues with transportation, or affording medications. However, he states, "I am tired of coming in and out of the hospital'. He has multiple co-morbidities such as CHF, DM, and the like. Will request for [facility] RNCM to be assigned for disease and symptom management and education for the above.   Confirmed best contact number as [phone]. Inpatient RNCM aware that [facility] can follow patient.  Marthenia Rolling, MSN-Ed, RN,BSN [facility] Liaison [phone]

## 2015-04-17 NOTE — Progress Notes (Signed)
  Echocardiogram 2D Echocardiogram has been performed.  Jennette Dubin 04/17/2015, 3:09 PM

## 2015-04-17 NOTE — Progress Notes (Signed)
[facility] PROGRESS NOTE  ARYAV WIMBERLY XLK:[id number] DOB: Jul 24, 1955 DOA: 04/13/2015 PCP: Antonietta Jewel, MD  Admit HPI / Brief Narrative: 59 year old male with PMH of cirrhosis secondary to NASH presented to [facility] ED 10/4 with c/o altered mental status. In ED he was hypotensive with elevated lactic acid. Also with fever. Concern for sepsis.  Assessment/Plan: Severe sepsis due to Healthcare Associated Pneumonia  Has defervesced - remains on empiric abx - no increased O2 requirement. WBC at 3.6. IV antibiotics have been narrowed down to IV Ancef per ID. ID ff.  Gram+ cocci bacteremia/viridans  strep group bacteremia Blood cultures with viridans strep group bacteremia. Patient has been seen in consultation by ID and antibiotics have been narrowed to IV Ancef. 2-D echo has been ordered to rule out endocarditis. Abdominal ultrasound also pending to rule out any evidence of ascites. Ascites is present will likely need a diagnostic paracentesis. Repeat blood cultures today. If repeat blood cultures as still negative after 2-3 days will likely need placement of a PICC line for home IV antibiotics. ID following and appreciate input and recommendations.   Cirrhosis secondary to NASH - recurrent hepatic encephalopathy Ammonia 180 - recheck in AM - mental status much improved at this time and at baseline. We'll resume home dose Lasix. Check an abdominal ultrasound to rule out ascites.   Acute Renal Failure crt presently normal   Mild hyponatremia Due to cirrhosis - stable   Hypomagnesemia  Replace and follow  Thrombocytopenia Declining - felt to be due to cirrhosis and splenic sequestration (splenomegally on CT abdom) - no evidence of gross blood loss - follow trend - avoid heparin products or ASA  Lactic Acidosis Resolved as of last check 10/4  DM - Early DKA  Hemoglobin A1c is 8.2. CBGs have improved. CBG ranging from 63-159. Continue NPH 80 units every morning, decrease nighttime  dose NPH to 40 units daily at bedtime. Sliding scale insulin.  Nephrolithiasis without obstruction CT renal stone study 10/4 unremarkable   CAD Stable  HTN Stable.  Code Status: Full Family Communication: Updated patient. No family at bedside. Disposition Plan: Hopefully in the next 2-3 days.   Consultants:  PCCM admitted  Infectious diseases: Dr. Baxter Flattery 04/16/2015  Procedures:  CT head 04/14/2015   Chest x-ray 04/14/2015    Antibiotics:  IV Fortaz 04/14/2015>>>> 04/16/2015  IV vancomycin 04/14/2015>>>> 04/16/2015  IV Ancef 04/16/2015  Azithromycin 04/14/2015>>>>> 04/16/2015  HPI/Subjective: Patient states he's feeling better. Patient denies any shortness of breath. No chest pain. Patient denies any active bleeding.  Objective: Filed Vitals:   04/17/15 0353  BP: 137/51  Pulse: 73  Temp: 97.8 F (36.6 C)  Resp: 18    Intake/Output Summary (Last 24 hours) at 04/17/15 1237 Last data filed at 04/17/15 0900  Gross per 24 hour  Intake    780 ml  Output      0 ml  Net    780 ml   Filed Weights   04/14/15 0644 04/16/15 0136 04/17/15 0353  Weight: 126.1 kg (278 lb) 134.3 kg (296 lb 1.2 oz) 133.9 kg (295 lb 3.1 oz)    Exam:   General:  NAD  Cardiovascular: RRR  Respiratory: CTAB  Abdomen: Soft/NT/ND/+BS  Musculoskeletal: No c/c/e  Data Reviewed: Basic Metabolic Panel:  Recent Labs Lab 04/14/15 2347 04/15/15 0300 04/16/15 0710 04/17/15 0410  NA 132* 134* 134* 138  K 4.2 3.5 3.7 3.6  CL 100* 103 105 104  CO2 22 24 23 24   GLUCOSE 295*  231* 136* 82  BUN 9 6 5* <5*  CREATININE 1.05 0.72 0.68 0.71  CALCIUM 9.2 7.9* 8.1* 8.3*  MG  --  1.4* 1.7 1.9  PHOS  --  2.8  --   --    Liver Function Tests:  Recent Labs Lab 04/14/15 2347 04/15/15 0300 04/16/15 0710  AST 67*  --  65*  ALT 44  --  45  ALKPHOS 178*  --  126  BILITOT 2.3*  --  1.4*  PROT 6.8  --  5.8*  ALBUMIN 3.0* 2.3* 2.4*   No results for input(s): LIPASE, AMYLASE in  the last 168 hours.  Recent Labs Lab 04/14/15 2347  AMMONIA 180*   CBC:  Recent Labs Lab 04/14/15 2347 04/15/15 0300 04/17/15 0410  WBC 9.6 3.0* 3.6*  NEUTROABS  --  1.5* 1.8  HGB 13.8 11.6* 12.1*  HCT 40.1 34.2* 34.9*  MCV 100.0 100.9* 100.9*  PLT 61* 32* 43*   Cardiac Enzymes:  Recent Labs Lab 04/14/15 0543 04/14/15 1112 04/14/15 1630  TROPONINI 0.06* 0.04* 0.03   BNP (last 3 results)  Recent Labs  10/14/14 2248  BNP 56.1    ProBNP (last 3 results)  Recent Labs  12/11/14 0913  PROBNP 38.0    CBG:  Recent Labs Lab 04/16/15 1656 04/16/15 2132 04/17/15 0626 04/17/15 0725 04/17/15 1134  GLUCAP 159* 166* 63* 105* 118*    Recent Results (from the past 240 hour(s))  Blood Culture (routine x 2)     Status: None   Collection Time: 04/13/15 11:45 PM  Result Value Ref Range Status   Specimen Description BLOOD RIGHT WRIST  Final   Special Requests BOTTLES DRAWN AEROBIC AND ANAEROBIC 10CC   Final   Culture  Setup Time   Final    GRAM POSITIVE COCCI IN PAIRS IN BOTH AEROBIC AND ANAEROBIC BOTTLES CRITICAL RESULT CALLED TO, READ BACK BY AND VERIFIED WITH: D GREGORY,RN AT 1127 04/14/15 BY L BENFIELD    Culture VIRIDANS STREPTOCOCCUS  Final   Report Status 04/16/2015 FINAL  Final   Organism ID, Bacteria VIRIDANS STREPTOCOCCUS  Final      Susceptibility   Viridans streptococcus - MIC*    ERYTHROMYCIN >=8 RESISTANT Resistant     TETRACYCLINE >=16 RESISTANT Resistant     VANCOMYCIN 0.5 SENSITIVE Sensitive     CLINDAMYCIN 0.5 INTERMEDIATE Intermediate     * VIRIDANS STREPTOCOCCUS  Blood Culture (routine x 2)     Status: None   Collection Time: 04/13/15 11:50 PM  Result Value Ref Range Status   Specimen Description BLOOD RIGHT HAND  Final   Special Requests BOTTLES DRAWN AEROBIC AND ANAEROBIC 5CC  Final   Culture  Setup Time   Final    GRAM POSITIVE COCCI IN PAIRS IN CHAINS IN BOTH AEROBIC AND ANAEROBIC BOTTLES CRITICAL RESULT CALLED TO, READ BACK BY  AND VERIFIED WITH: Belenda Cruise RN 11:00 04/14/15 ([email or web address])    Culture   Final    VIRIDANS STREPTOCOCCUS SUSCEPTIBILITIES PERFORMED ON PREVIOUS CULTURE WITHIN THE LAST 5 DAYS.    Report Status 04/16/2015 FINAL  Final  Urine culture     Status: None   Collection Time: 04/14/15 12:30 AM  Result Value Ref Range Status   Specimen Description URINE, RANDOM  Final   Special Requests NONE  Final   Culture MULTIPLE SPECIES PRESENT, SUGGEST RECOLLECTION  Final   Report Status 04/15/2015 FINAL  Final  MRSA PCR Screening     Status: None   Collection Time: 04/14/15  6:47 AM  Result Value Ref Range Status   MRSA by PCR NEGATIVE NEGATIVE Final    Comment:        The GeneXpert MRSA Assay (FDA approved for NASAL specimens only), is one component of a comprehensive MRSA colonization surveillance program. It is not intended to diagnose MRSA infection nor to guide or monitor treatment for MRSA infections.   Respiratory virus panel     Status: None   Collection Time: 04/15/15 12:54 PM  Result Value Ref Range Status   Respiratory Syncytial Virus A Negative Negative Final   Respiratory Syncytial Virus B Negative Negative Final   Influenza A Negative Negative Final   Influenza B Negative Negative Final   Parainfluenza 1 Negative Negative Final   Parainfluenza 2 Negative Negative Final   Parainfluenza 3 Negative Negative Final   Metapneumovirus Negative Negative Final   Rhinovirus Negative Negative Final   Adenovirus Negative Negative Final    Comment: (NOTE) Performed At: [facility] [address] [place], [place] [id number] Lindon Romp MD DT:[id number]      Studies: Dg Orthopantogram  04/16/2015   CLINICAL DATA:  Poor dentition.  Bacteremia.  EXAM: ORTHOPANTOGRAM/PANORAMIC  COMPARISON:  None.  FINDINGS: Patient is missing multiple teeth including the upper central incisors. There is concern for large caries involving the right upper lateral incisor. There may also be large  caries involving the left upper bicuspid. The lower central incisors may be broken but the roots appear to be present. Concern for caries involving the left lower lateral incisor. The molars have been removed. There may be a small molar remnant on the upper left. Mandible is intact. There may be mild periapical lucency involving the remnants of the lower central incisors.  IMPRESSION: Concern for multiple caries.  Subtle periapical lucency involving the lower central incisors.   Electronically Signed   By: Markus Daft M.D.   On: 04/16/2015 17:21    Scheduled Meds: .  ceFAZolin (ANCEF) IV  2 g Intravenous 3 times per day  . furosemide  40 mg Oral Daily  . insulin aspart  0-9 Units Subcutaneous TID WC  . insulin NPH Human  80 Units Subcutaneous QAC breakfast   And  . insulin NPH Human  50 Units Subcutaneous QHS  . lactulose  20 g Oral TID  . ranolazine  1,000 mg Oral BID  . rifaximin  550 mg Oral BID   Continuous Infusions:   Principal Problem:   Sepsis ([place]) Active Problems:   Bacteremia   Hepatic cirrhosis (HCC)   Hepatic encephalopathy (HCC)   Type 2 diabetes mellitus, uncontrolled ([place])   Thrombocytopenia ([place])   HCAP (healthcare-associated pneumonia)   ARF (acute renal failure) ([place])    Time spent: 33 minutes    THOMPSON,DANIEL M.D. [facility] Pager [phone]. If 7PM-7AM, please contact night-coverage at [email or web address], password [facility] 04/17/2015, 12:37 PM  LOS: 3 days

## 2015-04-17 NOTE — Care Management Important Message (Signed)
Important Message  Patient Details  Name: Adam Butler MRN: [id number] Date of Birth: Apr 18, 1956   Medicare Important Message Given:  Yes-second notification given    Nathen May 04/17/2015, 12:04 PM

## 2015-04-17 NOTE — Progress Notes (Signed)
Utilization review completed.  

## 2015-04-17 NOTE — Patient Outreach (Signed)
[facility]) Care Management  04/17/2015  DRAVEN NATTER May 16, 1956 [id number]   Referral from Marthenia Rolling, RN to assign Community RN, assigned [name], NP (for Kathie Rhodes, RN)  Thanks, Ronnell Freshwater. [place], [place] Assistant Phone: [phone] Fax: [phone]

## 2015-04-17 NOTE — Progress Notes (Signed)
Inpatient Diabetes Program Recommendations  AACE/ADA: New Consensus Statement on Inpatient Glycemic Control (2015)  Target Ranges:  Prepandial:   less than 140 mg/dL      Peak postprandial:   less than 180 mg/dL (1-2 hours)      Critically ill patients:  140 - 180 mg/dL   Review of Glycemic Control:  Results for CLERANCE, UMLAND (MRN [id number]) as of 04/17/2015 12:08  Ref. Range 04/16/2015 16:56 04/16/2015 21:32 04/17/2015 06:26 04/17/2015 07:25 04/17/2015 11:34  Glucose-Capillary Latest Ref Range: 65-99 mg/dL 159 (H) 166 (H) 63 (L) 105 (H) 118 (H)    Inpatient Diabetes Program Recommendations:     Please consider reducing bedtime NPH to 40 units q HS.  Thanks, Adah Perl, RN, BC-ADM Inpatient Diabetes Coordinator Pager [phone] (8a-5p)

## 2015-04-18 DIAGNOSIS — E119 Type 2 diabetes mellitus without complications: Secondary | ICD-10-CM

## 2015-04-18 DIAGNOSIS — D696 Thrombocytopenia, unspecified: Secondary | ICD-10-CM

## 2015-04-18 DIAGNOSIS — K089 Disorder of teeth and supporting structures, unspecified: Secondary | ICD-10-CM | POA: Insufficient documentation

## 2015-04-18 LAB — GLUCOSE, CAPILLARY
GLUCOSE-CAPILLARY: 146 mg/dL — AB (ref 65–99)
Glucose-Capillary: 67 mg/dL (ref 65–99)
Glucose-Capillary: 114 mg/dL — ABNORMAL HIGH (ref 65–99)
Glucose-Capillary: 129 mg/dL — ABNORMAL HIGH (ref 65–99)

## 2015-04-18 LAB — CBC WITH DIFFERENTIAL/PLATELET
BASOS ABS: 0 10*3/uL (ref 0.0–0.1)
Basophils Relative: 1 %
Eosinophils Absolute: 0.1 10*3/uL (ref 0.0–0.7)
Eosinophils Relative: 4 %
HEMATOCRIT: 34.7 % — AB (ref 39.0–52.0)
Hemoglobin: 11.9 g/dL — ABNORMAL LOW (ref 13.0–17.0)
LYMPHS PCT: 33 %
Lymphs Abs: 0.9 10*3/uL (ref 0.7–4.0)
MCH: 34.8 pg — ABNORMAL HIGH (ref 26.0–34.0)
MCHC: 34.3 g/dL (ref 30.0–36.0)
MCV: 101.5 fL — AB (ref 78.0–100.0)
Monocytes Absolute: 0.4 10*3/uL (ref 0.1–1.0)
Monocytes Relative: 15 %
NEUTROS ABS: 1.2 10*3/uL — AB (ref 1.7–7.7)
NEUTROS PCT: 47 %
Platelets: 38 10*3/uL — ABNORMAL LOW (ref 150–400)
RBC: 3.42 MIL/uL — AB (ref 4.22–5.81)
RDW: 14.1 % (ref 11.5–15.5)
WBC: 2.6 10*3/uL — AB (ref 4.0–10.5)

## 2015-04-18 LAB — BASIC METABOLIC PANEL
ANION GAP: 7 (ref 5–15)
BUN: <5 mg/dL — ABNORMAL LOW (ref 6–20)
CO2: 25 mmol/L (ref 22–32)
Calcium: 8.3 mg/dL — ABNORMAL LOW (ref 8.9–10.3)
Chloride: 104 mmol/L (ref 101–111)
Creatinine, Ser: 0.67 mg/dL (ref 0.61–1.24)
GFR calc Af Amer: >60 mL/min (ref >60)
GLUCOSE: 124 mg/dL — AB (ref 65–99)
POTASSIUM: 3.9 mmol/L (ref 3.5–5.1)
Sodium: 136 mmol/L (ref 135–145)

## 2015-04-18 MED ORDER — INSULIN NPH (HUMAN) (ISOPHANE) 100 UNIT/ML ~~LOC~~ SUSP
80.0000 [IU] | Freq: Every day | SUBCUTANEOUS | Status: DC
  Administered 2015-04-19: 80 [IU] via SUBCUTANEOUS
  Filled 2015-04-18: qty 10

## 2015-04-18 MED ORDER — SPIRONOLACTONE 25 MG PO TABS
25.0000 mg | ORAL_TABLET | Freq: Every day | ORAL | Status: DC
  Administered 2015-04-18 – 2015-04-20 (×3): 25 mg via ORAL
  Filled 2015-04-18 (×3): qty 1

## 2015-04-18 MED ORDER — INSULIN NPH (HUMAN) (ISOPHANE) 100 UNIT/ML ~~LOC~~ SUSP
35.0000 [IU] | Freq: Every day | SUBCUTANEOUS | Status: DC
  Administered 2015-04-18 – 2015-04-19 (×2): 35 [IU] via SUBCUTANEOUS

## 2015-04-18 NOTE — Progress Notes (Signed)
[facility] PROGRESS NOTE  Adam Butler [id number]:[id number] DOB: 1955/10/10 DOA: 04/13/2015 PCP: [name], NP  Admit HPI / Brief Narrative: 59 year old male with PMH of cirrhosis secondary to NASH presented to [facility] ED 10/4 with c/o altered mental status. In ED he was hypotensive with elevated lactic acid. Also with fever. Concern for sepsis.  Assessment/Plan: Severe sepsis due to Healthcare Associated Pneumonia  Has defervesced - remains on empiric abx - no increased O2 requirement. WBC at 2.6. IV antibiotics have been narrowed down to IV Ancef per ID. ID ff.  Gram+ cocci bacteremia/viridans  strep group bacteremia Blood cultures with viridans strep group bacteremia. Patient has been seen in consultation by ID and antibiotics have been narrowed to IV Ancef. 2-D echo with EF of 55-60%, trivial aortic valvular regurgitation, mildly dilated left atrium, mildly dilated right atrium, poor acoustic limits unable to adequately evaluate for endocarditis. Abdominal ultrasound negative for any acute abnormalities including ascites. Blood cultures have been repeated and are pending. A blood cultures are still negative tomorrow will place PICC line. Cardiology has been consulted for possible TEE to rule out endocarditis. If TEE is unable to be done will place patient on Rocephin 2 g IV daily 4 weeks. If repeat blood cultures are positive that will treat for 6 weeks. ID following and appreciate input and recommendations.   Cirrhosis secondary to NASH - recurrent hepatic encephalopathy Ammonia 180 - mental status much improved at this time and at baseline. Abdominal ultrasound negative for ascites. Continue home dose Lasix. Will add spironolactone.  Acute Renal Failure crt presently normal   Mild hyponatremia Due to cirrhosis - improving.   Hypomagnesemia  Replace and follow  Thrombocytopenia Declining - felt to be due to cirrhosis and splenic sequestration (splenomegally on CT abdom) - no  evidence of gross blood loss - follow trend - avoid heparin products or ASA  Lactic Acidosis Resolved as of last check 10/4  DM - Early DKA  Hemoglobin A1c is 8.2. CBGs have improved. CBG ranging from 67-159. Continue NPH 80 units every morning, decrease nighttime dose NPH to 35 units daily at bedtime. Sliding scale insulin.  Nephrolithiasis without obstruction CT renal stone study 10/4 unremarkable   CAD Stable  HTN Stable.  Leukopenia/thrombocytopenia/anemia Likely secondary to cirrhosis and alcohol abuse. Patient on empiric IV antibiotics. Follow.  Code Status: Full Family Communication: Updated patient. No family at bedside. Disposition Plan: Hopefully in the next 2-3 days.   Consultants:  PCCM admitted  Infectious diseases: Dr. Baxter Flattery 04/16/2015  Procedures:  CT head 04/14/2015   Chest x-ray 04/14/2015  2-D echo 04/17/2015  Abdominal ultrasound 04/17/2015  Antibiotics:  IV Fortaz 04/14/2015>>>> 04/16/2015  IV vancomycin 04/14/2015>>>> 04/16/2015  IV Ancef 04/16/2015  Azithromycin 04/14/2015>>>>> 04/16/2015  HPI/Subjective: Patient states he's feeling better. Patient denies any shortness of breath. No chest pain. Patient denies any active bleeding.  Objective: Filed Vitals:   04/18/15 0407  BP: 116/48  Pulse: 67  Temp: 97.9 F (36.6 C)  Resp: 18    Intake/Output Summary (Last 24 hours) at 04/18/15 1239 Last data filed at 04/17/15 1700  Gross per 24 hour  Intake    840 ml  Output      0 ml  Net    840 ml   Filed Weights   04/16/15 0136 04/17/15 0353 04/18/15 0407  Weight: 134.3 kg (296 lb 1.2 oz) 133.9 kg (295 lb 3.1 oz) 133.6 kg (294 lb 8.6 oz)    Exam:   General:  NAD  Cardiovascular: RRR  Respiratory: CTAB  Abdomen: Soft/NT/ND/+BS  Musculoskeletal: No c/c/ 1-2+ BLE edema  Data Reviewed: Basic Metabolic Panel:  Recent Labs Lab 04/14/15 2347 04/15/15 0300 04/16/15 0710 04/17/15 0410 04/18/15 0351  NA 132* 134* 134*  138 136  K 4.2 3.5 3.7 3.6 3.9  CL 100* 103 105 104 104  CO2 22 24 23 24 25   GLUCOSE 295* 231* 136* 82 124*  BUN 9 6 5* <5* <5*  CREATININE 1.05 0.72 0.68 0.71 0.67  CALCIUM 9.2 7.9* 8.1* 8.3* 8.3*  MG  --  1.4* 1.7 1.9  --   PHOS  --  2.8  --   --   --    Liver Function Tests:  Recent Labs Lab 04/14/15 2347 04/15/15 0300 04/16/15 0710  AST 67*  --  65*  ALT 44  --  45  ALKPHOS 178*  --  126  BILITOT 2.3*  --  1.4*  PROT 6.8  --  5.8*  ALBUMIN 3.0* 2.3* 2.4*   No results for input(s): LIPASE, AMYLASE in the last 168 hours.  Recent Labs Lab 04/14/15 2347  AMMONIA 180*   CBC:  Recent Labs Lab 04/14/15 2347 04/15/15 0300 04/17/15 0410 04/18/15 0351  WBC 9.6 3.0* 3.6* 2.6*  NEUTROABS  --  1.5* 1.8 1.2*  HGB 13.8 11.6* 12.1* 11.9*  HCT 40.1 34.2* 34.9* 34.7*  MCV 100.0 100.9* 100.9* 101.5*  PLT 61* 32* 43* 38*   Cardiac Enzymes:  Recent Labs Lab 04/14/15 0543 04/14/15 1112 04/14/15 1630  TROPONINI 0.06* 0.04* 0.03   BNP (last 3 results)  Recent Labs  10/14/14 2248  BNP 56.1    ProBNP (last 3 results)  Recent Labs  12/11/14 0913  PROBNP 38.0    CBG:  Recent Labs Lab 04/17/15 1134 04/17/15 1624 04/17/15 2100 04/18/15 0625 04/18/15 1152  GLUCAP 118* 129* 129* 67 114*    Recent Results (from the past 240 hour(s))  Blood Culture (routine x 2)     Status: None   Collection Time: 04/13/15 11:45 PM  Result Value Ref Range Status   Specimen Description BLOOD RIGHT WRIST  Final   Special Requests BOTTLES DRAWN AEROBIC AND ANAEROBIC 10CC   Final   Culture  Setup Time   Final    GRAM POSITIVE COCCI IN PAIRS IN BOTH AEROBIC AND ANAEROBIC BOTTLES CRITICAL RESULT CALLED TO, READ BACK BY AND VERIFIED WITH: D GREGORY,RN AT 1127 04/14/15 BY L BENFIELD    Culture VIRIDANS STREPTOCOCCUS  Final   Report Status 04/16/2015 FINAL  Final   Organism ID, Bacteria VIRIDANS STREPTOCOCCUS  Final      Susceptibility   Viridans streptococcus - MIC*     ERYTHROMYCIN >=8 RESISTANT Resistant     TETRACYCLINE >=16 RESISTANT Resistant     VANCOMYCIN 0.5 SENSITIVE Sensitive     CLINDAMYCIN 0.5 INTERMEDIATE Intermediate     * VIRIDANS STREPTOCOCCUS  Blood Culture (routine x 2)     Status: None   Collection Time: 04/13/15 11:50 PM  Result Value Ref Range Status   Specimen Description BLOOD RIGHT HAND  Final   Special Requests BOTTLES DRAWN AEROBIC AND ANAEROBIC 5CC  Final   Culture  Setup Time   Final    GRAM POSITIVE COCCI IN PAIRS IN CHAINS IN BOTH AEROBIC AND ANAEROBIC BOTTLES CRITICAL RESULT CALLED TO, READ BACK BY AND VERIFIED WITH: Belenda Cruise RN 11:00 04/14/15 ([email or web address])    Culture   Final    VIRIDANS STREPTOCOCCUS SUSCEPTIBILITIES PERFORMED ON  PREVIOUS CULTURE WITHIN THE LAST 5 DAYS.    Report Status 04/16/2015 FINAL  Final  Urine culture     Status: None   Collection Time: 04/14/15 12:30 AM  Result Value Ref Range Status   Specimen Description URINE, RANDOM  Final   Special Requests NONE  Final   Culture MULTIPLE SPECIES PRESENT, SUGGEST RECOLLECTION  Final   Report Status 04/15/2015 FINAL  Final  MRSA PCR Screening     Status: None   Collection Time: 04/14/15  6:47 AM  Result Value Ref Range Status   MRSA by PCR NEGATIVE NEGATIVE Final    Comment:        The GeneXpert MRSA Assay (FDA approved for NASAL specimens only), is one component of a comprehensive MRSA colonization surveillance program. It is not intended to diagnose MRSA infection nor to guide or monitor treatment for MRSA infections.   Respiratory virus panel     Status: None   Collection Time: 04/15/15 12:54 PM  Result Value Ref Range Status   Respiratory Syncytial Virus A Negative Negative Final   Respiratory Syncytial Virus B Negative Negative Final   Influenza A Negative Negative Final   Influenza B Negative Negative Final   Parainfluenza 1 Negative Negative Final   Parainfluenza 2 Negative Negative Final   Parainfluenza 3 Negative Negative Final    Metapneumovirus Negative Negative Final   Rhinovirus Negative Negative Final   Adenovirus Negative Negative Final    Comment: (NOTE) Performed At: [facility] [address] [place], [place] [id number] Lindon Romp MD [id number]   Culture, blood (routine x 2)     Status: None (Preliminary result)   Collection Time: 04/17/15 12:25 PM  Result Value Ref Range Status   Specimen Description BLOOD BLOOD LEFT HAND  Final   Special Requests BOTTLES DRAWN AEROBIC AND ANAEROBIC 5CC  Final   Culture NO GROWTH < 24 HOURS  Final   Report Status PENDING  Incomplete  Culture, blood (routine x 2)     Status: None (Preliminary result)   Collection Time: 04/17/15 12:30 PM  Result Value Ref Range Status   Specimen Description BLOOD BLOOD RIGHT HAND  Final   Special Requests IN PEDIATRIC BOTTLE 4CC  Final   Culture NO GROWTH < 24 HOURS  Final   Report Status PENDING  Incomplete     Studies: Dg Orthopantogram  04/16/2015   CLINICAL DATA:  Poor dentition.  Bacteremia.  EXAM: ORTHOPANTOGRAM/PANORAMIC  COMPARISON:  None.  FINDINGS: Patient is missing multiple teeth including the upper central incisors. There is concern for large caries involving the right upper lateral incisor. There may also be large caries involving the left upper bicuspid. The lower central incisors may be broken but the roots appear to be present. Concern for caries involving the left lower lateral incisor. The molars have been removed. There may be a small molar remnant on the upper left. Mandible is intact. There may be mild periapical lucency involving the remnants of the lower central incisors.  IMPRESSION: Concern for multiple caries.  Subtle periapical lucency involving the lower central incisors.   Electronically Signed   By: Markus Daft M.D.   On: 04/16/2015 17:21   US Abdomen Complete  04/18/2015   CLINICAL DATA:  Acute onset of bacteremia. Ascites. Initial encounter.  EXAM: ULTRASOUND ABDOMEN COMPLETE  COMPARISON:   CT of the abdomen and pelvis performed 04/14/2015  FINDINGS: Gallbladder: A few stones are seen within the gallbladder. The gallbladder is otherwise unremarkable. No significant  gallbladder wall thickening or pericholecystic fluid is seen. No ultrasonographic Murphy's sign is elicited.  Common bile duct: Diameter: 0.7 cm, borderline prominent.  Liver: No focal lesion identified. Within normal limits in parenchymal echogenicity. Difficult to fully characterize due to the patient's habitus.  IVC: No abnormality visualized.  Pancreas: Not visualized.  Spleen: Size and appearance within normal limits.  Right Kidney: Length: 12.2 cm. Echogenicity within normal limits. No mass or hydronephrosis visualized.  Left Kidney: Length: 11.6 cm. Echogenicity within normal limits. No mass or hydronephrosis visualized.  Abdominal aorta: No aneurysm visualized.  Other findings: None.  IMPRESSION: 1. No acute abnormality seen within the abdomen. Evaluation suboptimal due to the patient's habitus. 2. Cholelithiasis. Gallbladder otherwise unremarkable. Slight prominence of the common bile duct likely remains within normal limits.   Electronically Signed   By: Garald Balding M.D.   On: 04/18/2015 00:44    Scheduled Meds: .  ceFAZolin (ANCEF) IV  2 g Intravenous 3 times per day  . furosemide  40 mg Oral Daily  . insulin aspart  0-9 Units Subcutaneous TID WC  . insulin NPH Human  40 Units Subcutaneous QHS   And  . insulin NPH Human  80 Units Subcutaneous QAC breakfast  . lactulose  20 g Oral TID  . ranolazine  1,000 mg Oral BID  . rifaximin  550 mg Oral BID   Continuous Infusions:   Principal Problem:   Sepsis ([place]) Active Problems:   Bacteremia   Hepatic cirrhosis (HCC)   Hepatic encephalopathy (HCC)   Type 2 diabetes mellitus, uncontrolled ([employer])   Thrombocytopenia ([place])   HCAP (healthcare-associated pneumonia)   ARF (acute renal failure) ([place])    Time spent: 72 minutes    THOMPSON,DANIEL M.D. [facility] Pager [phone]. If 7PM-7AM, please contact night-coverage at [email or web address], password [facility] 04/18/2015, 12:39 PM  LOS: 4 days

## 2015-04-19 LAB — CBC WITH DIFFERENTIAL/PLATELET
BASOS PCT: 1 %
Basophils Absolute: 0 10*3/uL (ref 0.0–0.1)
EOS ABS: 0.2 10*3/uL (ref 0.0–0.7)
Eosinophils Relative: 5 %
HEMATOCRIT: 34.5 % — AB (ref 39.0–52.0)
HEMOGLOBIN: 11.9 g/dL — AB (ref 13.0–17.0)
Lymphocytes Relative: 29 %
Lymphs Abs: 0.9 10*3/uL (ref 0.7–4.0)
MCH: 35.1 pg — ABNORMAL HIGH (ref 26.0–34.0)
MCHC: 34.5 g/dL (ref 30.0–36.0)
MCV: 101.8 fL — ABNORMAL HIGH (ref 78.0–100.0)
Monocytes Absolute: 0.4 10*3/uL (ref 0.1–1.0)
Monocytes Relative: 14 %
NEUTROS ABS: 1.5 10*3/uL — AB (ref 1.7–7.7)
NEUTROS PCT: 51 %
Platelets: 37 10*3/uL — ABNORMAL LOW (ref 150–400)
RBC: 3.39 MIL/uL — AB (ref 4.22–5.81)
RDW: 13.9 % (ref 11.5–15.5)
WBC: 3 10*3/uL — AB (ref 4.0–10.5)

## 2015-04-19 LAB — BASIC METABOLIC PANEL
ANION GAP: 8 (ref 5–15)
BUN: <5 mg/dL — ABNORMAL LOW (ref 6–20)
CHLORIDE: 102 mmol/L (ref 101–111)
CO2: 26 mmol/L (ref 22–32)
Calcium: 8.5 mg/dL — ABNORMAL LOW (ref 8.9–10.3)
Creatinine, Ser: 0.68 mg/dL (ref 0.61–1.24)
GFR calc non Af Amer: >60 mL/min (ref >60)
Glucose, Bld: 99 mg/dL (ref 65–99)
POTASSIUM: 3.6 mmol/L (ref 3.5–5.1)
SODIUM: 136 mmol/L (ref 135–145)

## 2015-04-19 LAB — GLUCOSE, CAPILLARY
GLUCOSE-CAPILLARY: 65 mg/dL (ref 65–99)
Glucose-Capillary: 143 mg/dL — ABNORMAL HIGH (ref 65–99)
Glucose-Capillary: 111 mg/dL — ABNORMAL HIGH (ref 65–99)
Glucose-Capillary: 157 mg/dL — ABNORMAL HIGH (ref 65–99)

## 2015-04-19 MED ORDER — SODIUM CHLORIDE 0.9 % IJ SOLN
10.0000 mL | INTRAMUSCULAR | Status: DC | PRN
  Administered 2015-04-20 (×2): 10 mL
  Filled 2015-04-19 (×2): qty 40

## 2015-04-19 MED ORDER — SODIUM CHLORIDE 0.9 % IJ SOLN
10.0000 mL | Freq: Two times a day (BID) | INTRAMUSCULAR | Status: DC
  Administered 2015-04-19: 10 mL

## 2015-04-19 MED ORDER — NICOTINE 14 MG/24HR TD PT24
14.0000 mg | MEDICATED_PATCH | Freq: Every day | TRANSDERMAL | Status: DC
  Administered 2015-04-19 – 2015-04-20 (×2): 14 mg via TRANSDERMAL
  Filled 2015-04-19 (×2): qty 1

## 2015-04-19 NOTE — Progress Notes (Signed)
Peripherally Inserted Central Catheter/Midline Placement  The IV Nurse has discussed with the patient and/or persons authorized to consent for the patient, the purpose of this procedure and the potential benefits and risks involved with this procedure.  The benefits include less needle sticks, lab draws from the catheter and patient may be discharged home with the catheter.  Risks include, but not limited to, infection, bleeding, blood clot (thrombus formation), and puncture of an artery; nerve damage and irregular heat beat.  Alternatives to this procedure were also discussed.  PICC/Midline Placement Documentation  PICC / Midline Single Lumen 47/42/59 PICC Right Basilic 39 cm 0 cm (Active)  Indication for Insertion or Continuance of Line Home intravenous therapies (PICC only) 04/19/2015  2:54 PM  Exposed Catheter (cm) 0 cm 04/19/2015  2:54 PM  Site Assessment Clean;Dry;Intact 04/19/2015  2:54 PM  Line Status Flushed;Saline locked;Blood return noted 04/19/2015  2:54 PM  Dressing Type Transparent 04/19/2015  2:54 PM  Dressing Change Due 04/26/15 04/19/2015  2:54 PM       [place]ordan Payment 04/19/2015, 2:55 PM

## 2015-04-19 NOTE — Progress Notes (Signed)
[facility] PROGRESS NOTE  Adam Butler OAC:[id number] DOB: 1956/05/24 DOA: 04/13/2015 PCP: [name], NP  Admit HPI / Brief Narrative: 59 year old male with PMH of cirrhosis secondary to NASH presented to [facility] ED 10/4 with c/o altered mental status. In ED he was hypotensive with elevated lactic acid. Also with fever. Concern for sepsis.  Assessment/Plan: Severe sepsis due to Healthcare Associated Pneumonia  Has defervesced - remains on empiric abx - no increased O2 requirement. WBC at 3.0. IV antibiotics have been narrowed down to IV Ancef per ID. ID ff.  Gram+ cocci bacteremia/viridans  strep group bacteremia Blood cultures with viridans strep group bacteremia. Patient has been seen in consultation by ID and antibiotics have been narrowed to IV Ancef. 2-D echo with EF of 55-60%, trivial aortic valvular regurgitation, mildly dilated left atrium, mildly dilated right atrium, poor acoustic limits unable to adequately evaluate for endocarditis. Abdominal ultrasound negative for any acute abnormalities including ascites. Blood cultures have been repeated and are negative to date. Will place PICC line. Cardiology has been consulted for possible TEE to rule out endocarditis. If TEE is unable to be done will place patient on Rocephin 2 g IV daily 4 weeks. If repeat blood cultures are positive that will treat for 6 weeks. ID following and appreciate input and recommendations.   Cirrhosis secondary to NASH - recurrent hepatic encephalopathy Ammonia 180 - mental status much improved at this time and at baseline. Abdominal ultrasound negative for ascites. Continue home dose Lasix, spironolactone, xifaxin  Acute Renal Failure crt presently normal   Mild hyponatremia Due to cirrhosis - improving.   Hypomagnesemia  Replace and follow  Thrombocytopenia Declining - felt to be due to cirrhosis and splenic sequestration (splenomegally on CT abdom) - no evidence of gross blood loss - follow  trend - avoid heparin products or ASA  Lactic Acidosis Resolved as of last check 10/4  DM - Early DKA  Hemoglobin A1c is 8.2. CBGs have improved. CBG ranging from 65-159. Continue NPH 80 units every morning, decrease nighttime dose NPH to 30 units daily at bedtime. Sliding scale insulin.  Nephrolithiasis without obstruction CT renal stone study 10/4 unremarkable   CAD Stable  HTN Stable.  Leukopenia/thrombocytopenia/anemia Likely secondary to cirrhosis and alcohol abuse. Patient on empiric IV antibiotics. Follow.  Code Status: Full Family Communication: Updated patient. No family at bedside. Disposition Plan: Hopefully in the next 1-2 days.   Consultants:  PCCM admitted  Infectious diseases: Dr. Baxter Flattery 04/16/2015  Procedures:  CT head 04/14/2015   Chest x-ray 04/14/2015  2-D echo 04/17/2015  Abdominal ultrasound 04/17/2015  Antibiotics:  IV Fortaz 04/14/2015>>>> 04/16/2015  IV vancomycin 04/14/2015>>>> 04/16/2015  IV Ancef 04/16/2015  Azithromycin 04/14/2015>>>>> 04/16/2015  HPI/Subjective: Patient states he's feeling better. Patient denies any shortness of breath. No chest pain. Patient denies any active bleeding.  Objective: Filed Vitals:   04/19/15 1314  BP: 119/53  Pulse: 73  Temp: 97.8 F (36.6 C)  Resp: 18    Intake/Output Summary (Last 24 hours) at 04/19/15 1429 Last data filed at 04/19/15 1400  Gross per 24 hour  Intake   1440 ml  Output    200 ml  Net   1240 ml   Filed Weights   04/17/15 0353 04/18/15 0407 04/19/15 0355  Weight: 133.9 kg (295 lb 3.1 oz) 133.6 kg (294 lb 8.6 oz) 132.1 kg (291 lb 3.6 oz)    Exam:   General:  NAD  Cardiovascular: RRR  Respiratory: CTAB  Abdomen: Soft/NT/ND/+BS  Musculoskeletal: No c/c/ 1+ BLE edema  Data Reviewed: Basic Metabolic Panel:  Recent Labs Lab 04/15/15 0300 04/16/15 0710 04/17/15 0410 04/18/15 0351 04/19/15 0351  NA 134* 134* 138 136 136  K 3.5 3.7 3.6 3.9 3.6  CL 103  105 104 104 102  CO2 24 23 24 25 26   GLUCOSE 231* 136* 82 124* 99  BUN 6 5* <5* <5* <5*  CREATININE 0.72 0.68 0.71 0.67 0.68  CALCIUM 7.9* 8.1* 8.3* 8.3* 8.5*  MG 1.4* 1.7 1.9  --   --   PHOS 2.8  --   --   --   --    Liver Function Tests:  Recent Labs Lab 04/14/15 2347 04/15/15 0300 04/16/15 0710  AST 67*  --  65*  ALT 44  --  45  ALKPHOS 178*  --  126  BILITOT 2.3*  --  1.4*  PROT 6.8  --  5.8*  ALBUMIN 3.0* 2.3* 2.4*   No results for input(s): LIPASE, AMYLASE in the last 168 hours.  Recent Labs Lab 04/14/15 2347  AMMONIA 180*   CBC:  Recent Labs Lab 04/14/15 2347 04/15/15 0300 04/17/15 0410 04/18/15 0351 04/19/15 0351  WBC 9.6 3.0* 3.6* 2.6* 3.0*  NEUTROABS  --  1.5* 1.8 1.2* 1.5*  HGB 13.8 11.6* 12.1* 11.9* 11.9*  HCT 40.1 34.2* 34.9* 34.7* 34.5*  MCV 100.0 100.9* 100.9* 101.5* 101.8*  PLT 61* 32* 43* 38* 37*   Cardiac Enzymes:  Recent Labs Lab 04/14/15 0543 04/14/15 1112 04/14/15 1630  TROPONINI 0.06* 0.04* 0.03   BNP (last 3 results)  Recent Labs  10/14/14 2248  BNP 56.1    ProBNP (last 3 results)  Recent Labs  12/11/14 0913  PROBNP 38.0    CBG:  Recent Labs Lab 04/18/15 1152 04/18/15 1610 04/18/15 2114 04/19/15 0615 04/19/15 1128  GLUCAP 114* 146* 129* 65 143*    Recent Results (from the past 240 hour(s))  Blood Culture (routine x 2)     Status: None   Collection Time: 04/13/15 11:45 PM  Result Value Ref Range Status   Specimen Description BLOOD RIGHT WRIST  Final   Special Requests BOTTLES DRAWN AEROBIC AND ANAEROBIC 10CC   Final   Culture  Setup Time   Final    GRAM POSITIVE COCCI IN PAIRS IN BOTH AEROBIC AND ANAEROBIC BOTTLES CRITICAL RESULT CALLED TO, READ BACK BY AND VERIFIED WITH: D GREGORY,RN AT 1127 04/14/15 BY L BENFIELD    Culture VIRIDANS STREPTOCOCCUS  Final   Report Status 04/16/2015 FINAL  Final   Organism ID, Bacteria VIRIDANS STREPTOCOCCUS  Final      Susceptibility   Viridans streptococcus - MIC*     ERYTHROMYCIN >=8 RESISTANT Resistant     TETRACYCLINE >=16 RESISTANT Resistant     VANCOMYCIN 0.5 SENSITIVE Sensitive     CLINDAMYCIN 0.5 INTERMEDIATE Intermediate     * VIRIDANS STREPTOCOCCUS  Blood Culture (routine x 2)     Status: None   Collection Time: 04/13/15 11:50 PM  Result Value Ref Range Status   Specimen Description BLOOD RIGHT HAND  Final   Special Requests BOTTLES DRAWN AEROBIC AND ANAEROBIC 5CC  Final   Culture  Setup Time   Final    GRAM POSITIVE COCCI IN PAIRS IN CHAINS IN BOTH AEROBIC AND ANAEROBIC BOTTLES CRITICAL RESULT CALLED TO, READ BACK BY AND VERIFIED WITH: Belenda Cruise RN 11:00 04/14/15 ([email or web address])    Culture   Final    VIRIDANS STREPTOCOCCUS SUSCEPTIBILITIES PERFORMED ON PREVIOUS CULTURE WITHIN  THE LAST 5 DAYS.    Report Status 04/16/2015 FINAL  Final  Urine culture     Status: None   Collection Time: 04/14/15 12:30 AM  Result Value Ref Range Status   Specimen Description URINE, RANDOM  Final   Special Requests NONE  Final   Culture MULTIPLE SPECIES PRESENT, SUGGEST RECOLLECTION  Final   Report Status 04/15/2015 FINAL  Final  MRSA PCR Screening     Status: None   Collection Time: 04/14/15  6:47 AM  Result Value Ref Range Status   MRSA by PCR NEGATIVE NEGATIVE Final    Comment:        The GeneXpert MRSA Assay (FDA approved for NASAL specimens only), is one component of a comprehensive MRSA colonization surveillance program. It is not intended to diagnose MRSA infection nor to guide or monitor treatment for MRSA infections.   Respiratory virus panel     Status: None   Collection Time: 04/15/15 12:54 PM  Result Value Ref Range Status   Respiratory Syncytial Virus A Negative Negative Final   Respiratory Syncytial Virus B Negative Negative Final   Influenza A Negative Negative Final   Influenza B Negative Negative Final   Parainfluenza 1 Negative Negative Final   Parainfluenza 2 Negative Negative Final   Parainfluenza 3 Negative Negative Final    Metapneumovirus Negative Negative Final   Rhinovirus Negative Negative Final   Adenovirus Negative Negative Final    Comment: (NOTE) Performed At: [facility] [address] [place], [place] [id number] Lindon Romp MD AJ:[id number]   Culture, blood (routine x 2)     Status: None (Preliminary result)   Collection Time: 04/17/15 12:25 PM  Result Value Ref Range Status   Specimen Description BLOOD BLOOD LEFT HAND  Final   Special Requests BOTTLES DRAWN AEROBIC AND ANAEROBIC 5CC  Final   Culture NO GROWTH 2 DAYS  Final   Report Status PENDING  Incomplete  Culture, blood (routine x 2)     Status: None (Preliminary result)   Collection Time: 04/17/15 12:30 PM  Result Value Ref Range Status   Specimen Description BLOOD BLOOD RIGHT HAND  Final   Special Requests IN PEDIATRIC BOTTLE 4CC  Final   Culture NO GROWTH 2 DAYS  Final   Report Status PENDING  Incomplete     Studies: US Abdomen Complete  04/18/2015   CLINICAL DATA:  Acute onset of bacteremia. Ascites. Initial encounter.  EXAM: ULTRASOUND ABDOMEN COMPLETE  COMPARISON:  CT of the abdomen and pelvis performed 04/14/2015  FINDINGS: Gallbladder: A few stones are seen within the gallbladder. The gallbladder is otherwise unremarkable. No significant gallbladder wall thickening or pericholecystic fluid is seen. No ultrasonographic Murphy's sign is elicited.  Common bile duct: Diameter: 0.7 cm, borderline prominent.  Liver: No focal lesion identified. Within normal limits in parenchymal echogenicity. Difficult to fully characterize due to the patient's habitus.  IVC: No abnormality visualized.  Pancreas: Not visualized.  Spleen: Size and appearance within normal limits.  Right Kidney: Length: 12.2 cm. Echogenicity within normal limits. No mass or hydronephrosis visualized.  Left Kidney: Length: 11.6 cm. Echogenicity within normal limits. No mass or hydronephrosis visualized.  Abdominal aorta: No aneurysm visualized.  Other findings:  None.  IMPRESSION: 1. No acute abnormality seen within the abdomen. Evaluation suboptimal due to the patient's habitus. 2. Cholelithiasis. Gallbladder otherwise unremarkable. Slight prominence of the common bile duct likely remains within normal limits.   Electronically Signed   By: Garald Balding M.D.   On:  04/18/2015 00:44    Scheduled Meds: .  ceFAZolin (ANCEF) IV  2 g Intravenous 3 times per day  . furosemide  40 mg Oral Daily  . insulin aspart  0-9 Units Subcutaneous TID WC  . insulin NPH Human  35 Units Subcutaneous QHS   And  . insulin NPH Human  80 Units Subcutaneous QAC breakfast  . lactulose  20 g Oral TID  . ranolazine  1,000 mg Oral BID  . rifaximin  550 mg Oral BID  . spironolactone  25 mg Oral Daily   Continuous Infusions:   Principal Problem:   Sepsis ([place]) Active Problems:   Bacteremia   Hepatic cirrhosis (HCC)   Hepatic encephalopathy (HCC)   Type 2 diabetes mellitus, uncontrolled ([place])   Thrombocytopenia ([place])   HCAP (healthcare-associated pneumonia)   ARF (acute renal failure) (HCC)   Poor dentition    Time spent: 53 minutes    THOMPSON,DANIEL M.D. [facility] Pager [phone]. If 7PM-7AM, please contact night-coverage at [email or web address], password [facility] 04/19/2015, 2:29 PM  LOS: 5 days

## 2015-04-20 ENCOUNTER — Encounter (HOSPITAL_COMMUNITY)
Admission: EM | Disposition: A | Discharge status: Home Health | Payer: Self-pay | Source: Home / Self Care | Attending: Internal Medicine

## 2015-04-20 ENCOUNTER — Encounter (HOSPITAL_COMMUNITY): Payer: Self-pay | Admitting: *Deleted

## 2015-04-20 ENCOUNTER — Inpatient Hospital Stay (HOSPITAL_COMMUNITY): Payer: PPO

## 2015-04-20 DIAGNOSIS — K089 Disorder of teeth and supporting structures, unspecified: Secondary | ICD-10-CM

## 2015-04-20 DIAGNOSIS — E119 Type 2 diabetes mellitus without complications: Secondary | ICD-10-CM | POA: Insufficient documentation

## 2015-04-20 DIAGNOSIS — Q2112 Patent foramen ovale: Secondary | ICD-10-CM

## 2015-04-20 DIAGNOSIS — Q211 Atrial septal defect: Secondary | ICD-10-CM

## 2015-04-20 DIAGNOSIS — I288 Other diseases of pulmonary vessels: Secondary | ICD-10-CM

## 2015-04-20 HISTORY — PX: TEE WITHOUT CARDIOVERSION: SHX5443

## 2015-04-20 HISTORY — DX: Atrial septal defect: Q21.1

## 2015-04-20 HISTORY — DX: Patent foramen ovale: Q21.12

## 2015-04-20 LAB — GLUCOSE, CAPILLARY
GLUCOSE-CAPILLARY: 55 mg/dL — AB (ref 65–99)
GLUCOSE-CAPILLARY: 69 mg/dL (ref 65–99)
GLUCOSE-CAPILLARY: 114 mg/dL — AB (ref 65–99)
Glucose-Capillary: 74 mg/dL (ref 65–99)

## 2015-04-20 LAB — CBC
HCT: 33.5 % — ABNORMAL LOW (ref 39.0–52.0)
Hemoglobin: 11.3 g/dL — ABNORMAL LOW (ref 13.0–17.0)
MCH: 34.1 pg — AB (ref 26.0–34.0)
MCHC: 33.7 g/dL (ref 30.0–36.0)
MCV: 101.2 fL — ABNORMAL HIGH (ref 78.0–100.0)
PLATELETS: 45 10*3/uL — AB (ref 150–400)
RBC: 3.31 MIL/uL — AB (ref 4.22–5.81)
RDW: 13.9 % (ref 11.5–15.5)
WBC: 3.4 10*3/uL — ABNORMAL LOW (ref 4.0–10.5)

## 2015-04-20 LAB — BASIC METABOLIC PANEL
Anion gap: 7 (ref 5–15)
BUN: 8 mg/dL (ref 6–20)
CO2: 27 mmol/L (ref 22–32)
CREATININE: 0.79 mg/dL (ref 0.61–1.24)
Calcium: 8.5 mg/dL — ABNORMAL LOW (ref 8.9–10.3)
Chloride: 104 mmol/L (ref 101–111)
Glucose, Bld: 81 mg/dL (ref 65–99)
Potassium: 4 mmol/L (ref 3.5–5.1)
SODIUM: 138 mmol/L (ref 135–145)

## 2015-04-20 SURGERY — ECHOCARDIOGRAM, TRANSESOPHAGEAL
Anesthesia: Moderate Sedation

## 2015-04-20 MED ORDER — SODIUM CHLORIDE 0.9 % IV SOLN: INTRAVENOUS | Status: DC

## 2015-04-20 MED ORDER — LIDOCAINE VISCOUS 2 % MT SOLN
OROMUCOSAL | Status: DC | PRN
  Administered 2015-04-20: 1 via OROMUCOSAL

## 2015-04-20 MED ORDER — FENTANYL CITRATE (PF) 100 MCG/2ML IJ SOLN
INTRAMUSCULAR | Status: AC
  Filled 2015-04-20: qty 2

## 2015-04-20 MED ORDER — NICOTINE 14 MG/24HR TD PT24: 14.0000 mg | MEDICATED_PATCH | Freq: Every day | TRANSDERMAL | Status: DC

## 2015-04-20 MED ORDER — MIDAZOLAM HCL 5 MG/ML IJ SOLN
INTRAMUSCULAR | Status: AC
  Filled 2015-04-20: qty 2

## 2015-04-20 MED ORDER — DIPHENHYDRAMINE HCL 50 MG/ML IJ SOLN
INTRAMUSCULAR | Status: AC
  Filled 2015-04-20: qty 1

## 2015-04-20 MED ORDER — DEXTROSE 50 % IV SOLN
INTRAVENOUS | Status: AC
  Filled 2015-04-20: qty 50

## 2015-04-20 MED ORDER — MIDAZOLAM HCL 10 MG/2ML IJ SOLN
INTRAMUSCULAR | Status: DC | PRN
  Administered 2015-04-20 (×2): 2 mg via INTRAVENOUS

## 2015-04-20 MED ORDER — DEXTROSE 5 % IV SOLN: 2.0000 g | INTRAVENOUS | Status: AC

## 2015-04-20 MED ORDER — SPIRONOLACTONE 25 MG PO TABS: 25.0000 mg | ORAL_TABLET | Freq: Every day | ORAL | Status: DC

## 2015-04-20 MED ORDER — FENTANYL CITRATE (PF) 100 MCG/2ML IJ SOLN
INTRAMUSCULAR | Status: DC | PRN
  Administered 2015-04-20: 25 ug via INTRAVENOUS

## 2015-04-20 MED ORDER — INSULIN NPH (HUMAN) (ISOPHANE) 100 UNIT/ML ~~LOC~~ SUSP: 35.0000 [IU] | SUBCUTANEOUS | Status: DC

## 2015-04-20 MED ORDER — LIDOCAINE VISCOUS 2 % MT SOLN
OROMUCOSAL | Status: AC
  Filled 2015-04-20: qty 15

## 2015-04-20 MED ORDER — INSULIN NPH (HUMAN) (ISOPHANE) 100 UNIT/ML ~~LOC~~ SUSP: 35.0000 [IU] | Freq: Every day | SUBCUTANEOUS | Status: DC

## 2015-04-20 MED ORDER — HEPARIN SOD (PORK) LOCK FLUSH 100 UNIT/ML IV SOLN
250.0000 [IU] | INTRAVENOUS | Status: AC | PRN
Start: 2015-04-20 — End: 2015-04-20
  Administered 2015-04-20: 250 [IU]

## 2015-04-20 MED ORDER — INSULIN NPH (HUMAN) (ISOPHANE) 100 UNIT/ML ~~LOC~~ SUSP
20.0000 [IU] | Freq: Every day | SUBCUTANEOUS | Status: DC
  Filled 2015-04-20: qty 10

## 2015-04-20 MED ORDER — SULFAMETHOXAZOLE-TRIMETHOPRIM 800-160 MG PO TABS: 1.0000 | ORAL_TABLET | Freq: Every day | ORAL | Status: DC

## 2015-04-20 NOTE — Discharge Summary (Signed)
Physician Discharge Summary  Adam Butler WNU:[id number] DOB: 1955/08/22 DOA: 04/13/2015  PCP: [name], NP  Admit date: 04/13/2015 Discharge date: 04/20/2015  Time spent: 65 minutes  Recommendations for Outpatient Follow-up:  1. Follow-up with [name], NP it 1-2 weeks. On follow-up patient needs a comprehensive metabolic profile done to follow-up on electrolytes renal function and LFTs. Patient's bacteremia also need to be followed up upon. Patient is being discharged home on 2 weeks of IV Rocephin. 2. Follow-up with Dr. Aundra Dubin in 3 weeks for follow-up on large PFO. 3. Follow up with dentist as outpatient for poor dentition.  Discharge Diagnoses:  Principal Problem:   Sepsis ([place]) Active Problems:   Bacteremia   Hepatic cirrhosis (HCC)   Hepatic encephalopathy (HCC)   Type 2 diabetes mellitus, uncontrolled (HCC)   Thrombocytopenia (HCC)   HCAP (healthcare-associated pneumonia)   ARF (acute renal failure) (HCC)   Poor dentition   PFO (patent foramen ovale): Per TEE 04/20/2015   Type 2 diabetes, HbA1c goal < 7% ([place])   Discharge Condition: Stable and improved  Diet recommendation: Heart healthy  Filed Weights   04/18/15 0407 04/19/15 0355 04/20/15 0425  Weight: 133.6 kg (294 lb 8.6 oz) 132.1 kg (291 lb 3.6 oz) 131.2 kg (289 lb 3.9 oz)    History of present illness:  Per Dr Ashok Cordia  59 year old male with PMH as below, which is significant for Cirrhosis secondary to NASH with several admissions already this year for recurrent hepatic encephalopathy. He takes lactulose and rifaximin at home for this. Also significant for DM, GI bleed, CAD s/p PCI 0347, and diastolic CHF.He was most recently admitted in August 2016 with hepatic encephalopathy which responded well to dose escalation of lactulose. He had been doing well since that time, up until about a week PTA when he developed some fatigue and sleeping more heavily during the day time. He takes his lactulose daily  and has not missed any doses. He has 4 bowel movements per day. He started rifaximin just about a week ago. 10/3 PM He went out do dinner with his family, and seemed to act normal to his wife. After dinner they went grocery shopping and he states "he couldn't do this" because he was feeling badly. He then went home and showered, but was markedly confused. She thought his ammonia was probably high, because this has happened several times in the recent past. In ED he was noted to be febrile with fever 104F and was hypotensive. Lactic acid was also noted to be elevated. CXR was obtained that demonstrated possible pneumonia and there was also concern for UTI. PCCM asked to see for admission  Hospital Course:  Severe sepsis due to Healthcare Associated Pneumonia  Patient was initially admitted to the critical care service secondary to sepsis for UTI and possible pneumonia. Patient was placed empirically on IV antibiotics of IV Fortaz, IV vancomycin, IV azithromycin. Patient improved clinically had improved O2 requirements and was subsequently transitioned to the step down unit. Patient continued to improve remained afebrile blood pressure improved and he was transferred to the regular floor. Blood cultures which were drawn were positive for viridans strep group. ID was consulted and IV antibiotics were narrowed down to IV Ancef. TEE which was done was negative for any endocarditis. Was recommended per ID the patient be discharged home on 2 weeks of IV Rocephin. Patient off follow-up with PCP and cardiology as outpatient.  Gram+ cocci bacteremia/viridans strep group bacteremia Blood cultures with viridans strep  group bacteremia. Patient has been seen in consultation by ID and antibiotics have been narrowed to IV Ancef. 2-D echo with EF of 55-60%, trivial aortic valvular regurgitation, mildly dilated left atrium, mildly dilated right atrium, poor acoustic limits unable to adequately evaluate for endocarditis.  Abdominal ultrasound negative for any acute abnormalities including ascites. Blood cultures have been repeated and were negative to date. PICC line was placed. Patient underwent a TEE by cardiology on 04/20/2015 which was negative for vegetations however did show a large PFO. Patient will be discharged home on 2 weeks of IV Rocephin per ID recommendations don't need to follow-up with PCP as outpatient. Patient also need to follow-up with cardiology as outpatient.   Cirrhosis secondary to NASH - recurrent hepatic encephalopathy Ammonia 180 - mental status much improved at this time and at baseline. Abdominal ultrasound negative for ascites. Patient was placed back on his home dose Lasix and Xifaxan. Spironolactone was added to his regimen. Outpatient follow-up.   Acute Renal Failure Resolved by day of discharge.  Mild hyponatremia Due to cirrhosis - improved and resolved by day of discharge.   Hypomagnesemia  Repleted.  Large PFO Noted on TEE. Patient will need to follow-up with his cardiologist as outpatient.  Thrombocytopenia Declining - felt to be due to cirrhosis and splenic sequestration (splenomegally on CT abdom) - no evidence of gross blood loss - follow trend - avoid heparin products or ASA  Lactic Acidosis Resolved as of last check 10/4  DM - Early DKA  Hemoglobin A1c is 8.2. Patient had few hypoglycemic episodes in the mornings and a such patient's NPH was adjusted. Patient need to follow-up with PCP as outpatient. Patient was also maintained on sliding scale insulin.  Nephrolithiasis without obstruction CT renal stone study 10/4 unremarkable   CAD Stable  HTN Stable.  Leukopenia/thrombocytopenia/anemia Likely secondary to cirrhosis and alcohol abuse on possible infection. Patient was placed on empiric IV antibiotics and followed. Patient's leukopenia thrombocytopenia improved during the hospitalization. Patient did not have any overt signs of bleeding. Outpatient  follow-up.   Poor dentition Outpatient follow-up.   Procedures:  CT head 04/14/2015   Chest x-ray 04/14/2015  2-D echo 04/17/2015  Abdominal ultrasound 04/17/2015  TEE 04/20/2015 per cardiology, Dr. Harrington Challenger   Consultations:  PCCM admitted  Infectious diseases: Dr. Baxter Flattery 04/16/2015    Discharge Exam: Filed Vitals:   04/20/15 1500  BP: 119/60  Pulse: 66  Temp: 97.9 F (36.6 C)  Resp: 16    General: NAD Cardiovascular: RRR Respiratory: CTAB  Discharge Instructions   Discharge Instructions    AMB Referral to [facility]    Complete by:  As directed   Please assign to [facility] for symptom and disease management for DM. Has multiple co-morbidities. Has had x5 admits in past 6 months. To have new Primary Care MD on 04/23/15 at 315pm, [name], NP. Consents signed. Currently in hospital. May have home health at discharge. Please call with questions. Thanks. Marthenia Rolling, [place], RN,BSN-THN [facility] [phone]  Reason for consult:  Please assign to [facility]  Expected date of contact:  1-3 days (reserved for hospital discharges)     Diet - low sodium heart healthy    Complete by:  As directed      Diet Carb Modified    Complete by:  As directed      Discharge instructions    Complete by:  As directed   Follow up with BAITY, [name], NP in 2 weeks. Follow  up with cardiology, Dr Aundra Dubin in 2-3 weeks. Follow up with a dentist.     Increase activity slowly    Complete by:  As directed           Current Discharge Medication List    START taking these medications   Details  cefTRIAXone 2 g in dextrose 5 % 50 mL Inject 2 g into the vein daily. Take for 12 days then stop. Qty: 24 g, Refills: 0    nicotine (NICODERM CQ - DOSED IN MG/24 HOURS) 14 mg/24hr patch Place 1 patch (14 mg total) onto the skin daily. Qty: 28 patch, Refills: 0    spironolactone (ALDACTONE) 25 MG tablet Take 1 tablet (25 mg total) by mouth daily. Qty: 30  tablet, Refills: 0      CONTINUE these medications which have CHANGED   Details  insulin NPH Human (HUMULIN N,NOVOLIN N) 100 UNIT/ML injection Inject 0.35-0.8 mLs (35-80 Units total) into the skin See admin instructions. Take 80 units in the morning and 35 units in the evening. Qty: 10 mL, Refills: 0    sulfamethoxazole-trimethoprim (BACTRIM DS,SEPTRA DS) 800-160 MG tablet Take 1 tablet by mouth daily. Resume in 2 weeks after IV antibiotics have been completed.      CONTINUE these medications which have NOT CHANGED   Details  aspirin 81 MG tablet Take 81 mg by mouth daily.     atorvastatin (LIPITOR) 10 MG tablet Take 1 tablet (10 mg total) by mouth daily. Qty: 30 tablet, Refills: 6    furosemide (LASIX) 40 MG tablet Take 40 mg by mouth daily.     lactulose (CHRONULAC) 10 GM/15ML solution Take 45 mLs (30 g total) by mouth 2 (two) times daily. Titrate to 3-4 bowel movements daily Qty: 1892 mL, Refills: 5    metFORMIN (GLUCOPHAGE) 1000 MG tablet Take 1,000 mg by mouth 2 (two) times daily. Refills: 11    Multiple Vitamin (MULTIVITAMIN) capsule Take 1 capsule by mouth daily.      nadolol (CORGARD) 40 MG tablet Take 1 tablet (40 mg total) by mouth daily. Qty: 30 tablet, Refills: 5   Associated Diagnoses: Encephalopathy, hepatic ([place]); Liver cirrhosis secondary to nonalcoholic steatohepatitis ([facility])    nitroGLYCERIN (NITROSTAT) 0.4 MG SL tablet Place 1 tablet (0.4 mg total) under the tongue every 5 (five) minutes as needed. Qty: 30 tablet, Refills: 6   Associated Diagnoses: Chronic diastolic heart failure ([place]); Coronary atherosclerosis of native coronary artery    omeprazole (PRILOSEC) 20 MG capsule Take 1 capsule (20 mg total) by mouth daily. Qty: 30 capsule, Refills: 6    ranolazine (RANEXA) 500 MG 12 hr tablet Take 2 tablets (1,000 mg total) by mouth 2 (two) times daily. Qty: 60 tablet, Refills: 11   Associated Diagnoses: Coronary artery disease involving native coronary artery  of native heart with angina pectoris (HCC)    rifaximin (XIFAXAN) 550 MG TABS tablet Take 1 tablet (550 mg total) by mouth 2 (two) times daily. Qty: 24 tablet, Refills: 0      STOP taking these medications     enalapril (VASOTEC) 20 MG tablet        Allergies  Allergen Reactions  . Isosorbide     Nose bleeds   Follow-up Information    Follow up with [name], NP On 04/23/2015.   Specialty:  Internal Medicine   Why:  at  3:15 pm   Contact information:   [address] [place] [address] [phone]  Follow up with [facility].   Why:  HH-RN for IV abx   Contact information:   [address] [place] [address] [phone]       Follow up with Loralie Champagne, MD. Schedule an appointment as soon as possible for a visit in 3 weeks.   Specialty:  Cardiology   Contact information:   [address] [place] [address] [phone]       Please follow up.   Why:  F/U WITH DENTIST       The results of significant diagnostics from this hospitalization (including imaging, microbiology, ancillary and laboratory) are listed below for reference.    Significant Diagnostic Studies: Dg Orthopantogram  04/16/2015   CLINICAL DATA:  Poor dentition.  Bacteremia.  EXAM: ORTHOPANTOGRAM/PANORAMIC  COMPARISON:  None.  FINDINGS: Patient is missing multiple teeth including the upper central incisors. There is concern for large caries involving the right upper lateral incisor. There may also be large caries involving the left upper bicuspid. The lower central incisors may be broken but the roots appear to be present. Concern for caries involving the left lower lateral incisor. The molars have been removed. There may be a small molar remnant on the upper left. Mandible is intact. There may be mild periapical lucency involving the remnants of the lower central incisors.  IMPRESSION: Concern for multiple caries.  Subtle periapical  lucency involving the lower central incisors.   Electronically Signed   By: Markus Daft M.D.   On: 04/16/2015 17:21   Ct Head Wo Contrast  04/14/2015   CLINICAL DATA:  Acute onset of altered mental status. Fruity odor to breath. Personal history of elevated ammonia. Initial encounter.  EXAM: CT HEAD WITHOUT CONTRAST  TECHNIQUE: Contiguous axial images were obtained from the base of the skull through the vertex without intravenous contrast.  COMPARISON:  CT of the head performed 01/19/2015  FINDINGS: There is no evidence of acute infarction, mass lesion, or intra- or extra-axial hemorrhage on CT.  Scattered periventricular and subcortical white matter change likely reflects small vessel ischemic microangiopathy. A small chronic infarct is noted at the right occipital lobe.  The posterior fossa, including the cerebellum, brainstem and fourth ventricle, is within normal limits. The third and lateral ventricles, and basal ganglia are unremarkable in appearance. No mass effect or midline shift is seen.  There is no evidence of fracture; visualized osseous structures are unremarkable in appearance. The orbits are within normal limits. The paranasal sinuses and mastoid air cells are well-aerated. No significant soft tissue abnormalities are seen.  IMPRESSION: 1. No acute intracranial pathology seen on CT. 2. Scattered small vessel ischemic microangiopathy. Small chronic infarct at the right occipital lobe.   Electronically Signed   By: Garald Balding M.D.   On: 04/14/2015 04:09   US Abdomen Complete  04/18/2015   CLINICAL DATA:  Acute onset of bacteremia. Ascites. Initial encounter.  EXAM: ULTRASOUND ABDOMEN COMPLETE  COMPARISON:  CT of the abdomen and pelvis performed 04/14/2015  FINDINGS: Gallbladder: A few stones are seen within the gallbladder. The gallbladder is otherwise unremarkable. No significant gallbladder wall thickening or pericholecystic fluid is seen. No ultrasonographic Murphy's sign is elicited.   Common bile duct: Diameter: 0.7 cm, borderline prominent.  Liver: No focal lesion identified. Within normal limits in parenchymal echogenicity. Difficult to fully characterize due to the patient's habitus.  IVC: No abnormality visualized.  Pancreas: Not visualized.  Spleen: Size and appearance within normal limits.  Right  Kidney: Length: 12.2 cm. Echogenicity within normal limits. No mass or hydronephrosis visualized.  Left Kidney: Length: 11.6 cm. Echogenicity within normal limits. No mass or hydronephrosis visualized.  Abdominal aorta: No aneurysm visualized.  Other findings: None.  IMPRESSION: 1. No acute abnormality seen within the abdomen. Evaluation suboptimal due to the patient's habitus. 2. Cholelithiasis. Gallbladder otherwise unremarkable. Slight prominence of the common bile duct likely remains within normal limits.   Electronically Signed   By: Garald Balding M.D.   On: 04/18/2015 00:44   Dg Chest Port 1 View  04/14/2015   CLINICAL DATA:  Acute onset of shortness of breath. Initial encounter.  EXAM: PORTABLE CHEST 1 VIEW  COMPARISON:  Chest radiograph performed 01/31/2015  FINDINGS: The lungs are hypoexpanded. Vascular congestion is noted, with increased interstitial markings, concerning for mild pulmonary edema. There is no evidence of pleural effusion or pneumothorax.  The cardiomediastinal silhouette is borderline normal in size. No acute osseous abnormalities are seen.  IMPRESSION: Lungs hypoexpanded. Vascular congestion, with increased interstitial markings, concerning for mild pulmonary edema.   Electronically Signed   By: Garald Balding M.D.   On: 04/14/2015 01:26   Ct Renal Stone Study  04/14/2015   CLINICAL DATA:  Altered mental status, first noticed around 20:30.  EXAM: CT ABDOMEN AND PELVIS WITHOUT CONTRAST  TECHNIQUE: Multidetector CT imaging of the abdomen and pelvis was performed following the standard protocol without IV contrast.  COMPARISON:  12/29/2013  FINDINGS: No acute finding  is evident in the lower chest.  The liver is cirrhotic. No focal liver lesion is evident on this unenhanced scan. There is no bile duct dilatation. There are numerous calculi within the gallbladder lumen.  There are numerous very large varices, including very prominent varices around the distal esophagus and stomach. There is recanalization of the umbilical vein with caput medusa. There are numerous varices elsewhere in the abdomen and pelvis. The portal vein is enlarged and partially calcified. The calcification corresponds to the area of partial thrombosis observed on 12/29/2013. Patency of the portal vein cannot be established on this study. There is mild splenomegaly, probably slightly worsened. There is a very small volume of ascites collected dependently in the pelvis.  There is a 2 x 3 mm lower pole left collecting system calculus. There is no hydronephrosis or ureteral dilatation. There is no bowel obstruction. There is no extraluminal air. There is hazy high attenuation in the mesentery which is worsened from 12/29/2013. This mesenteric edema could be related to portal hypertension.  No significant skeletal lesion is evident.  IMPRESSION: 1. Cirrhosis with portal hypertension. Very large varices. Patency of the portal vein is indeterminate. 2. Very small volume of ascites collected dependently in the pelvis, new. Worsened mesenteric edema. 3. Mild splenomegaly, probably mildly worsened. 4. Left nephrolithiasis. 5. No evidence of bowel obstruction or perforation. No focal inflammatory changes are evident. 6. Cholelithiasis   Electronically Signed   By: Andreas Newport M.D.   On: 04/14/2015 04:19    Microbiology: Recent Results (from the past 240 hour(s))  Blood Culture (routine x 2)     Status: None   Collection Time: 04/13/15 11:45 PM  Result Value Ref Range Status   Specimen Description BLOOD RIGHT WRIST  Final   Special Requests BOTTLES DRAWN AEROBIC AND ANAEROBIC 10CC   Final   Culture  Setup  Time   Final    GRAM POSITIVE COCCI IN PAIRS IN BOTH AEROBIC AND ANAEROBIC BOTTLES CRITICAL RESULT CALLED TO, READ BACK BY AND  VERIFIED WITH: D GREGORY,RN AT 1127 04/14/15 BY L BENFIELD    Culture VIRIDANS STREPTOCOCCUS  Final   Report Status 04/16/2015 FINAL  Final   Organism ID, Bacteria VIRIDANS STREPTOCOCCUS  Final      Susceptibility   Viridans streptococcus - MIC*    ERYTHROMYCIN >=8 RESISTANT Resistant     TETRACYCLINE >=16 RESISTANT Resistant     VANCOMYCIN 0.5 SENSITIVE Sensitive     CLINDAMYCIN 0.5 INTERMEDIATE Intermediate     * VIRIDANS STREPTOCOCCUS  Blood Culture (routine x 2)     Status: None   Collection Time: 04/13/15 11:50 PM  Result Value Ref Range Status   Specimen Description BLOOD RIGHT HAND  Final   Special Requests BOTTLES DRAWN AEROBIC AND ANAEROBIC 5CC  Final   Culture  Setup Time   Final    GRAM POSITIVE COCCI IN PAIRS IN CHAINS IN BOTH AEROBIC AND ANAEROBIC BOTTLES CRITICAL RESULT CALLED TO, READ BACK BY AND VERIFIED WITH: Belenda Cruise RN 11:00 04/14/15 ([email or web address])    Culture   Final    VIRIDANS STREPTOCOCCUS SUSCEPTIBILITIES PERFORMED ON PREVIOUS CULTURE WITHIN THE LAST 5 DAYS.    Report Status 04/16/2015 FINAL  Final  Urine culture     Status: None   Collection Time: 04/14/15 12:30 AM  Result Value Ref Range Status   Specimen Description URINE, RANDOM  Final   Special Requests NONE  Final   Culture MULTIPLE SPECIES PRESENT, SUGGEST RECOLLECTION  Final   Report Status 04/15/2015 FINAL  Final  MRSA PCR Screening     Status: None   Collection Time: 04/14/15  6:47 AM  Result Value Ref Range Status   MRSA by PCR NEGATIVE NEGATIVE Final    Comment:        The GeneXpert MRSA Assay (FDA approved for NASAL specimens only), is one component of a comprehensive MRSA colonization surveillance program. It is not intended to diagnose MRSA infection nor to guide or monitor treatment for MRSA infections.   Respiratory virus panel     Status: None   Collection  Time: 04/15/15 12:54 PM  Result Value Ref Range Status   Respiratory Syncytial Virus A Negative Negative Final   Respiratory Syncytial Virus B Negative Negative Final   Influenza A Negative Negative Final   Influenza B Negative Negative Final   Parainfluenza 1 Negative Negative Final   Parainfluenza 2 Negative Negative Final   Parainfluenza 3 Negative Negative Final   Metapneumovirus Negative Negative Final   Rhinovirus Negative Negative Final   Adenovirus Negative Negative Final    Comment: (NOTE) Performed At: [facility] [address] [place], [place] [id number] Lindon Romp MD KC:[id number]   Culture, blood (routine x 2)     Status: None (Preliminary result)   Collection Time: 04/17/15 12:25 PM  Result Value Ref Range Status   Specimen Description BLOOD BLOOD LEFT HAND  Final   Special Requests BOTTLES DRAWN AEROBIC AND ANAEROBIC 5CC  Final   Culture NO GROWTH 3 DAYS  Final   Report Status PENDING  Incomplete  Culture, blood (routine x 2)     Status: None (Preliminary result)   Collection Time: 04/17/15 12:30 PM  Result Value Ref Range Status   Specimen Description BLOOD BLOOD RIGHT HAND  Final   Special Requests IN PEDIATRIC BOTTLE 4CC  Final   Culture NO GROWTH 3 DAYS  Final   Report Status PENDING  Incomplete     Labs: Basic Metabolic Panel:  Recent Labs Lab 04/15/15 0300 04/16/15 0710 04/17/15  0410 04/18/15 0351 04/19/15 0351 04/20/15 0413  NA 134* 134* 138 136 136 138  K 3.5 3.7 3.6 3.9 3.6 4.0  CL 103 105 104 104 102 104  CO2 24 23 24 25 26 27   GLUCOSE 231* 136* 82 124* 99 81  BUN 6 5* <5* <5* <5* 8  CREATININE 0.72 0.68 0.71 0.67 0.68 0.79  CALCIUM 7.9* 8.1* 8.3* 8.3* 8.5* 8.5*  MG 1.4* 1.7 1.9  --   --   --   PHOS 2.8  --   --   --   --   --    Liver Function Tests:  Recent Labs Lab 04/14/15 2347 04/15/15 0300 04/16/15 0710  AST 67*  --  65*  ALT 44  --  45  ALKPHOS 178*  --  126  BILITOT 2.3*  --  1.4*  PROT 6.8  --  5.8*   ALBUMIN 3.0* 2.3* 2.4*   No results for input(s): LIPASE, AMYLASE in the last 168 hours.  Recent Labs Lab 04/14/15 2347  AMMONIA 180*   CBC:  Recent Labs Lab 04/15/15 0300 04/17/15 0410 04/18/15 0351 04/19/15 0351 04/20/15 0413  WBC 3.0* 3.6* 2.6* 3.0* 3.4*  NEUTROABS 1.5* 1.8 1.2* 1.5*  --   HGB 11.6* 12.1* 11.9* 11.9* 11.3*  HCT 34.2* 34.9* 34.7* 34.5* 33.5*  MCV 100.9* 100.9* 101.5* 101.8* 101.2*  PLT 32* 43* 38* 37* 45*   Cardiac Enzymes:  Recent Labs Lab 04/14/15 0543 04/14/15 1112 04/14/15 1630  TROPONINI 0.06* 0.04* 0.03   BNP: BNP (last 3 results)  Recent Labs  10/14/14 2248  BNP 56.1    ProBNP (last 3 results)  Recent Labs  12/11/14 0913  PROBNP 38.0    CBG:  Recent Labs Lab 04/19/15 1128 04/19/15 1605 04/19/15 2106 04/20/15 0604 04/20/15 0709  GLUCAP 143* 111* 157* 55* 74       Signed:  Etty Isaac  MD [facility] 04/20/2015, 3:37 PM

## 2015-04-20 NOTE — Care Management Note (Addendum)
Case Management Note CM note started by Ellan Lambert RNCM  Patient Details  Name: Adam Butler MRN: [id number] Date of Birth: 12-Dec-1955  Subjective/Objective:    Pt admitted on 04/13/15 with HCAP, sepsis.  PTA, pt independent, lives with spouse.              Action/Plan: Will follow for discharge planning as pt progresses.  PT recommending no OP follow up.    Expected Discharge Date:       04/21/15           Expected Discharge Plan:  [place]  In-House Referral:     Discharge planning Services  CM Consult  Post Acute Care Choice:  Home Health Choice offered to:  Patient  DME Arranged:    DME Agency:     HH Arranged:  RN, IV Antibiotics HH Agency:  [facility]  Status of Service:  Completed, signed off  Medicare Important Message Given:  Yes-second notification given Date Medicare IM Given:    Medicare IM give by:    Date Additional Medicare IM Given:    Additional Medicare Important Message give by:     If discussed at [place] of Stay Meetings, dates discussed:    Additional Comments:  04/20/15- updated- pt for d/c home today have updated Pam with [facility] regarding d/c- orders have been placed for [facility]- and Pam will pick up paper prescription for IV Rocephin.   04/20/15- Marvetta Gibbons RN, BSN - pt has PICC line for home IV abx, per MD for TEE today and then ID to give plan for IV abx for home- spoke with pt and brother at bedside- per pt he is ready to go home- understands that [facility] will teach him how to infuse iv abx and care for PICC- choice for [facility] services in [facility] offered- pt to use [facility] per choice for HH iv abx- referral for HH-RN for IV abx called to [facility] with [facility]- awaiting orders following TEE today. NCM to follow for further d/c needs.   04/17/15- pt with positive blood cultures- will need PICC line placed and course of IV abx- NCM to follow for [facility] needs and IV abx arrangements once orders placed     Dawayne Patricia, RN 04/20/2015, 9:53 AM

## 2015-04-20 NOTE — Progress Notes (Signed)
    [facility] has been requested to perform a transesophageal echocardiogram on Adam Butler for gram + bacteremia and endocarditis evaluation.  After careful review of history and examination, the risks and benefits of transesophageal echocardiogram have been explained including risks of esophageal damage, perforation (1:10,000 risk), bleeding, pharyngeal hematoma as well as other potential complications associated with conscious sedation including aspiration, arrhythmia, respiratory failure and death. Alternatives to treatment were discussed, questions were answered. Patient is willing to proceed.   Tarri Fuller, [facility] 04/20/2015 9:36 AM

## 2015-04-20 NOTE — Progress Notes (Signed)
Utilization review completed.  

## 2015-04-20 NOTE — H&P (View-Only) (Signed)
[facility] PROGRESS NOTE  Adam Butler CBU:[id number] DOB: 1955-07-22 DOA: 04/13/2015 PCP: [name], NP  Admit HPI / Brief Narrative: 59 year old male with PMH of cirrhosis secondary to NASH presented to [facility] ED 10/4 with c/o altered mental status. In ED he was hypotensive with elevated lactic acid. Also with fever. Concern for sepsis.  Assessment/Plan: Severe sepsis due to Healthcare Associated Pneumonia  Has defervesced - remains on empiric abx - no increased O2 requirement. WBC at 3.0. IV antibiotics have been narrowed down to IV Ancef per ID. ID ff.  Gram+ cocci bacteremia/viridans  strep group bacteremia Blood cultures with viridans strep group bacteremia. Patient has been seen in consultation by ID and antibiotics have been narrowed to IV Ancef. 2-D echo with EF of 55-60%, trivial aortic valvular regurgitation, mildly dilated left atrium, mildly dilated right atrium, poor acoustic limits unable to adequately evaluate for endocarditis. Abdominal ultrasound negative for any acute abnormalities including ascites. Blood cultures have been repeated and are negative to date. Will place PICC line. Cardiology has been consulted for possible TEE to rule out endocarditis. If TEE is unable to be done will place patient on Rocephin 2 g IV daily 4 weeks. If repeat blood cultures are positive that will treat for 6 weeks. ID following and appreciate input and recommendations.   Cirrhosis secondary to NASH - recurrent hepatic encephalopathy Ammonia 180 - mental status much improved at this time and at baseline. Abdominal ultrasound negative for ascites. Continue home dose Lasix, spironolactone, xifaxin  Acute Renal Failure crt presently normal   Mild hyponatremia Due to cirrhosis - improving.   Hypomagnesemia  Replace and follow  Thrombocytopenia Declining - felt to be due to cirrhosis and splenic sequestration (splenomegally on CT abdom) - no evidence of gross blood loss - follow  trend - avoid heparin products or ASA  Lactic Acidosis Resolved as of last check 10/4  DM - Early DKA  Hemoglobin A1c is 8.2. CBGs have improved. CBG ranging from 65-159. Continue NPH 80 units every morning, decrease nighttime dose NPH to 30 units daily at bedtime. Sliding scale insulin.  Nephrolithiasis without obstruction CT renal stone study 10/4 unremarkable   CAD Stable  HTN Stable.  Leukopenia/thrombocytopenia/anemia Likely secondary to cirrhosis and alcohol abuse. Patient on empiric IV antibiotics. Follow.  Code Status: Full Family Communication: Updated patient. No family at bedside. Disposition Plan: Hopefully in the next 1-2 days.   Consultants:  PCCM admitted  Infectious diseases: Dr. Baxter Flattery 04/16/2015  Procedures:  CT head 04/14/2015   Chest x-ray 04/14/2015  2-D echo 04/17/2015  Abdominal ultrasound 04/17/2015  Antibiotics:  IV Fortaz 04/14/2015>>>> 04/16/2015  IV vancomycin 04/14/2015>>>> 04/16/2015  IV Ancef 04/16/2015  Azithromycin 04/14/2015>>>>> 04/16/2015  HPI/Subjective: Patient states he's feeling better. Patient denies any shortness of breath. No chest pain. Patient denies any active bleeding.  Objective: Filed Vitals:   04/19/15 1314  BP: 119/53  Pulse: 73  Temp: 97.8 F (36.6 C)  Resp: 18    Intake/Output Summary (Last 24 hours) at 04/19/15 1429 Last data filed at 04/19/15 1400  Gross per 24 hour  Intake   1440 ml  Output    200 ml  Net   1240 ml   Filed Weights   04/17/15 0353 04/18/15 0407 04/19/15 0355  Weight: 133.9 kg (295 lb 3.1 oz) 133.6 kg (294 lb 8.6 oz) 132.1 kg (291 lb 3.6 oz)    Exam:   General:  NAD  Cardiovascular: RRR  Respiratory: CTAB  Abdomen: Soft/NT/ND/+BS  Musculoskeletal: No c/c/ 1+ BLE edema  Data Reviewed: Basic Metabolic Panel:  Recent Labs Lab 04/15/15 0300 04/16/15 0710 04/17/15 0410 04/18/15 0351 04/19/15 0351  NA 134* 134* 138 136 136  K 3.5 3.7 3.6 3.9 3.6  CL 103  105 104 104 102  CO2 24 23 24 25 26   GLUCOSE 231* 136* 82 124* 99  BUN 6 5* <5* <5* <5*  CREATININE 0.72 0.68 0.71 0.67 0.68  CALCIUM 7.9* 8.1* 8.3* 8.3* 8.5*  MG 1.4* 1.7 1.9  --   --   PHOS 2.8  --   --   --   --    Liver Function Tests:  Recent Labs Lab 04/14/15 2347 04/15/15 0300 04/16/15 0710  AST 67*  --  65*  ALT 44  --  45  ALKPHOS 178*  --  126  BILITOT 2.3*  --  1.4*  PROT 6.8  --  5.8*  ALBUMIN 3.0* 2.3* 2.4*   No results for input(s): LIPASE, AMYLASE in the last 168 hours.  Recent Labs Lab 04/14/15 2347  AMMONIA 180*   CBC:  Recent Labs Lab 04/14/15 2347 04/15/15 0300 04/17/15 0410 04/18/15 0351 04/19/15 0351  WBC 9.6 3.0* 3.6* 2.6* 3.0*  NEUTROABS  --  1.5* 1.8 1.2* 1.5*  HGB 13.8 11.6* 12.1* 11.9* 11.9*  HCT 40.1 34.2* 34.9* 34.7* 34.5*  MCV 100.0 100.9* 100.9* 101.5* 101.8*  PLT 61* 32* 43* 38* 37*   Cardiac Enzymes:  Recent Labs Lab 04/14/15 0543 04/14/15 1112 04/14/15 1630  TROPONINI 0.06* 0.04* 0.03   BNP (last 3 results)  Recent Labs  10/14/14 2248  BNP 56.1    ProBNP (last 3 results)  Recent Labs  12/11/14 0913  PROBNP 38.0    CBG:  Recent Labs Lab 04/18/15 1152 04/18/15 1610 04/18/15 2114 04/19/15 0615 04/19/15 1128  GLUCAP 114* 146* 129* 65 143*    Recent Results (from the past 240 hour(s))  Blood Culture (routine x 2)     Status: None   Collection Time: 04/13/15 11:45 PM  Result Value Ref Range Status   Specimen Description BLOOD RIGHT WRIST  Final   Special Requests BOTTLES DRAWN AEROBIC AND ANAEROBIC 10CC   Final   Culture  Setup Time   Final    GRAM POSITIVE COCCI IN PAIRS IN BOTH AEROBIC AND ANAEROBIC BOTTLES CRITICAL RESULT CALLED TO, READ BACK BY AND VERIFIED WITH: D GREGORY,RN AT 1127 04/14/15 BY L BENFIELD    Culture VIRIDANS STREPTOCOCCUS  Final   Report Status 04/16/2015 FINAL  Final   Organism ID, Bacteria VIRIDANS STREPTOCOCCUS  Final      Susceptibility   Viridans streptococcus - MIC*     ERYTHROMYCIN >=8 RESISTANT Resistant     TETRACYCLINE >=16 RESISTANT Resistant     VANCOMYCIN 0.5 SENSITIVE Sensitive     CLINDAMYCIN 0.5 INTERMEDIATE Intermediate     * VIRIDANS STREPTOCOCCUS  Blood Culture (routine x 2)     Status: None   Collection Time: 04/13/15 11:50 PM  Result Value Ref Range Status   Specimen Description BLOOD RIGHT HAND  Final   Special Requests BOTTLES DRAWN AEROBIC AND ANAEROBIC 5CC  Final   Culture  Setup Time   Final    GRAM POSITIVE COCCI IN PAIRS IN CHAINS IN BOTH AEROBIC AND ANAEROBIC BOTTLES CRITICAL RESULT CALLED TO, READ BACK BY AND VERIFIED WITH: Belenda Cruise RN 11:00 04/14/15 ([email or web address])    Culture   Final    VIRIDANS STREPTOCOCCUS SUSCEPTIBILITIES PERFORMED ON PREVIOUS CULTURE WITHIN  THE LAST 5 DAYS.    Report Status 04/16/2015 FINAL  Final  Urine culture     Status: None   Collection Time: 04/14/15 12:30 AM  Result Value Ref Range Status   Specimen Description URINE, RANDOM  Final   Special Requests NONE  Final   Culture MULTIPLE SPECIES PRESENT, SUGGEST RECOLLECTION  Final   Report Status 04/15/2015 FINAL  Final  MRSA PCR Screening     Status: None   Collection Time: 04/14/15  6:47 AM  Result Value Ref Range Status   MRSA by PCR NEGATIVE NEGATIVE Final    Comment:        The GeneXpert MRSA Assay (FDA approved for NASAL specimens only), is one component of a comprehensive MRSA colonization surveillance program. It is not intended to diagnose MRSA infection nor to guide or monitor treatment for MRSA infections.   Respiratory virus panel     Status: None   Collection Time: 04/15/15 12:54 PM  Result Value Ref Range Status   Respiratory Syncytial Virus A Negative Negative Final   Respiratory Syncytial Virus B Negative Negative Final   Influenza A Negative Negative Final   Influenza B Negative Negative Final   Parainfluenza 1 Negative Negative Final   Parainfluenza 2 Negative Negative Final   Parainfluenza 3 Negative Negative Final    Metapneumovirus Negative Negative Final   Rhinovirus Negative Negative Final   Adenovirus Negative Negative Final    Comment: (NOTE) Performed At: [facility] [address] [place], [place] [id number] Lindon Romp MD EB:[id number]   Culture, blood (routine x 2)     Status: None (Preliminary result)   Collection Time: 04/17/15 12:25 PM  Result Value Ref Range Status   Specimen Description BLOOD BLOOD LEFT HAND  Final   Special Requests BOTTLES DRAWN AEROBIC AND ANAEROBIC 5CC  Final   Culture NO GROWTH 2 DAYS  Final   Report Status PENDING  Incomplete  Culture, blood (routine x 2)     Status: None (Preliminary result)   Collection Time: 04/17/15 12:30 PM  Result Value Ref Range Status   Specimen Description BLOOD BLOOD RIGHT HAND  Final   Special Requests IN PEDIATRIC BOTTLE 4CC  Final   Culture NO GROWTH 2 DAYS  Final   Report Status PENDING  Incomplete     Studies: US Abdomen Complete  04/18/2015   CLINICAL DATA:  Acute onset of bacteremia. Ascites. Initial encounter.  EXAM: ULTRASOUND ABDOMEN COMPLETE  COMPARISON:  CT of the abdomen and pelvis performed 04/14/2015  FINDINGS: Gallbladder: A few stones are seen within the gallbladder. The gallbladder is otherwise unremarkable. No significant gallbladder wall thickening or pericholecystic fluid is seen. No ultrasonographic Murphy's sign is elicited.  Common bile duct: Diameter: 0.7 cm, borderline prominent.  Liver: No focal lesion identified. Within normal limits in parenchymal echogenicity. Difficult to fully characterize due to the patient's habitus.  IVC: No abnormality visualized.  Pancreas: Not visualized.  Spleen: Size and appearance within normal limits.  Right Kidney: Length: 12.2 cm. Echogenicity within normal limits. No mass or hydronephrosis visualized.  Left Kidney: Length: 11.6 cm. Echogenicity within normal limits. No mass or hydronephrosis visualized.  Abdominal aorta: No aneurysm visualized.  Other findings:  None.  IMPRESSION: 1. No acute abnormality seen within the abdomen. Evaluation suboptimal due to the patient's habitus. 2. Cholelithiasis. Gallbladder otherwise unremarkable. Slight prominence of the common bile duct likely remains within normal limits.   Electronically Signed   By: Garald Balding M.D.   On:  04/18/2015 00:44    Scheduled Meds: .  ceFAZolin (ANCEF) IV  2 g Intravenous 3 times per day  . furosemide  40 mg Oral Daily  . insulin aspart  0-9 Units Subcutaneous TID WC  . insulin NPH Human  35 Units Subcutaneous QHS   And  . insulin NPH Human  80 Units Subcutaneous QAC breakfast  . lactulose  20 g Oral TID  . ranolazine  1,000 mg Oral BID  . rifaximin  550 mg Oral BID  . spironolactone  25 mg Oral Daily   Continuous Infusions:   Principal Problem:   Sepsis ([place]) Active Problems:   Bacteremia   Hepatic cirrhosis (HCC)   Hepatic encephalopathy (HCC)   Type 2 diabetes mellitus, uncontrolled ([place])   Thrombocytopenia ([place])   HCAP (healthcare-associated pneumonia)   ARF (acute renal failure) (HCC)   Poor dentition    Time spent: 59 minutes    THOMPSON,DANIEL M.D. [facility] Pager [phone]. If 7PM-7AM, please contact night-coverage at [email or web address], password [facility] 04/19/2015, 2:29 PM  LOS: 5 days

## 2015-04-20 NOTE — Progress Notes (Addendum)
NURSING PROGRESS NOTE  Adam Butler [id number] Discharge Data: 04/20/2015 5:42 PM Attending Provider: Eugenie Filler, MD DPT:ELMRA, Rollene Fare, NP   [name] to be D/C'd Home per MD order with PICC line for IV ABX, with home health set up to administer them. [place] did patient education teaching and confirmed she will be seeing the patient tomorrow at his home between 10-12pm. Patient was given hard copies of all prescriptions, Pam RN was personally given hard copy of IV abx prescription.    All IV's will be discontinued and monitored for bleeding.  All belongings will be returned to patient for patient to take home.  Last Documented Vital Signs:  Blood pressure 119/60, pulse 66, temperature 97.9 F (36.6 C), temperature source Oral, resp. rate 16, height 5' 7"  (1.702 m), weight 131.2 kg (289 lb 3.9 oz), SpO2 99 %.  Hendricks Limes RN, BS, BSN

## 2015-04-20 NOTE — Op Note (Signed)
TV normal  Mild to mod TR MV normal  Trace MR AV normal  No AI PV normal Large PFO present as tested with injection of agitated salline  Mild fixed plaquing of the thoracic aorta

## 2015-04-20 NOTE — Progress Notes (Signed)
Spoke with Colletta [place] from Endoscopy, made her aware there were no orders for patient TEE procedure, therefore unable to obtain consent. Orders to be placed for procedure including obtaining consent in Endoscopy per [facility].

## 2015-04-20 NOTE — Progress Notes (Signed)
Spoke with CM Kristi, patient ready for discharge pending completion of patient education from home health nurse, whom will do patient teaching at bedside.

## 2015-04-20 NOTE — Interval H&P Note (Signed)
History and Physical Interval Note:  04/20/2015 12:19 PM  Adam Butler  has presented today for surgery, with the diagnosis of positive blood cultures  The various methods of treatment have been discussed with the patient and family. After consideration of risks, benefits and other options for treatment, the patient has consented to  Procedure(s): TRANSESOPHAGEAL ECHOCARDIOGRAM (TEE) (N/A) as a surgical intervention .  The patient's history has been reviewed, patient examined, no change in status, stable for surgery.  I have reviewed the patient's chart and labs.  Questions were answered to the patient's satisfaction.     I have reviewed with GI Pt with very mild varices on EGD in June 2015  GI feels low risk  Will proceed with caution.  Dorris Carnes

## 2015-04-20 NOTE — Progress Notes (Signed)
*  PRELIMINARY RESULTS* Echocardiogram Echocardiogram Transesophageal has been performed.  Adam Butler 04/20/2015, 1:17 PM

## 2015-04-21 ENCOUNTER — Encounter (HOSPITAL_COMMUNITY): Payer: Self-pay | Admitting: Internal Medicine

## 2015-04-22 ENCOUNTER — Other Ambulatory Visit: Payer: Self-pay | Admitting: *Deleted

## 2015-04-22 LAB — CULTURE, BLOOD (ROUTINE X 2)
CULTURE: NO GROWTH
Culture: NO GROWTH

## 2015-04-22 NOTE — Patient Outreach (Signed)
Pt agrees to a home visit tomorrow am. We need to get a dentist for him to have his teeth pulled.  [name] [facility] [phone]

## 2015-04-23 ENCOUNTER — Encounter: Payer: Self-pay | Admitting: *Deleted

## 2015-04-23 ENCOUNTER — Ambulatory Visit (INDEPENDENT_AMBULATORY_CARE_PROVIDER_SITE_OTHER): Payer: PPO | Admitting: Internal Medicine

## 2015-04-23 ENCOUNTER — Other Ambulatory Visit: Payer: Self-pay | Admitting: *Deleted

## 2015-04-23 ENCOUNTER — Encounter: Payer: Self-pay | Admitting: Internal Medicine

## 2015-04-23 VITALS — BP 108/66 | HR 76 | Temp 97.7°F | Ht 67.0 in | Wt 284.0 lb

## 2015-04-23 DIAGNOSIS — Q211 Atrial septal defect: Secondary | ICD-10-CM | POA: Diagnosis not present

## 2015-04-23 DIAGNOSIS — I5032 Chronic diastolic (congestive) heart failure: Secondary | ICD-10-CM | POA: Diagnosis not present

## 2015-04-23 DIAGNOSIS — Q2112 Patent foramen ovale: Secondary | ICD-10-CM

## 2015-04-23 DIAGNOSIS — I2510 Atherosclerotic heart disease of native coronary artery without angina pectoris: Secondary | ICD-10-CM

## 2015-04-23 DIAGNOSIS — Z794 Long term (current) use of insulin: Secondary | ICD-10-CM

## 2015-04-23 DIAGNOSIS — E1165 Type 2 diabetes mellitus with hyperglycemia: Secondary | ICD-10-CM

## 2015-04-23 DIAGNOSIS — R7881 Bacteremia: Secondary | ICD-10-CM

## 2015-04-23 DIAGNOSIS — E785 Hyperlipidemia, unspecified: Secondary | ICD-10-CM

## 2015-04-23 DIAGNOSIS — I8500 Esophageal varices without bleeding: Secondary | ICD-10-CM

## 2015-04-23 DIAGNOSIS — K7460 Unspecified cirrhosis of liver: Secondary | ICD-10-CM

## 2015-04-23 NOTE — Progress Notes (Signed)
HPI  Pt presents to the clinic today to establish care and for management of the conditions listed below.  Flu:04/15/2015 Tetanus: unsure Prevnar: never Pneumovax: 02/2015 PSA screening: unsure Colon Screening: 04/2012 Vision Screening: as needed Dentist: as needed  Cirrhosis secondary to NAFLD: Hx of esophageal varices, s/p banding. He gets encephaloptahy frequently (4 admissions in the last 6 months) and takes Lactulose daily. He has been having 8-10 BM's per day. He is on Xifaxan. He follows with Dr. Hilarie Fredrickson.  HLD: He denies myalgias on Lipitor. He does try to consume a low fat diet.  CAD s/p MI: He has had stents. He takes ASA daily. He takes Nadalol and Ranexa as prescribed. He has an appt with Dr. Aundra Dubin on 10/24.  CHF: BP well controlled on Lasix. He does have some lower extremity edema.  DM 2: His last A1C was 8.2 %. His sugars range 65- 155. He takes NPH, 80 units in the morning and 50 units at night. He takes Metformin as prescribed. He has already had a flu and pneumovax this year. He has never had Prevnar. He does not get yearly eye exams.  Arthritis: He denies any joint issues at this time.  GERD: He takes Prilosec daily. He denies breakthrough symtoms.  PFO: Recently found during his recent hospital admission for sepsis. Noted on TEE. He has a follow up appt with Dr. Aundra Dubin 10/24.  Past Medical History  Diagnosis Date  . Cirrhosis ([place]) 2011    Cryptogenic, Likely NASH. Family/pt deny EtOH. HCV, HBV, HAV negative. ANA negative. AMA positive. Ascites 12/11  . Hyperlipemia   . Coronary artery disease     Inferior MI 12/11; LHC with occluded mid CFX and 80% proximal RCA. EF 55%. He had 3.0 x 28 vision BMS to CFX  . Diastolic CHF, acute ([place])     Echo 12/11 with ef 50-55% and mild LVH. EF 55% by LV0gram in 12/11  . Hypertension   . Type II diabetes mellitus ([place]) 2011  . SVT (supraventricular tachycardia) ([place])     1/12: appeared to be an ectopic atrial tachycardia.  Required DCCV with hemodynamic instability  . GI bleed     12/11: Etiology not clearly defined. EGD: nonbleeding esophageal varices.  . S/P coronary artery stent placement 06/2010  . Esophageal varices ([place]) 2011, 2013    no hx acute variceal bleed  . Hepatic encephalopathy ([place]) 2011, 12/2013  . Myocardial infarction [facility]) ? 2012  . Shortness of breath dyspnea   . Arthritis   . Hx of echocardiogram     Echo 5/16:  EF 55-60%, no RWMA, mod LAE  . Thrombocytopenia ([place])   . Cholelithiasis   . Rectal varices   . PFO (patent foramen ovale): Per TEE 04/20/2015 04/20/2015    Current Outpatient Prescriptions  Medication Sig Dispense Refill  . aspirin 81 MG tablet Take 81 mg by mouth daily.     Marland Kitchen atorvastatin (LIPITOR) 10 MG tablet Take 1 tablet (10 mg total) by mouth daily. 30 tablet 6  . cefTRIAXone 2 g in dextrose 5 % 50 mL Inject 2 g into the vein daily. Take for 12 days then stop. 24 g 0  . furosemide (LASIX) 40 MG tablet Take 40 mg by mouth daily.     . insulin NPH Human (HUMULIN N,NOVOLIN N) 100 UNIT/ML injection Inject 0.35-0.8 mLs (35-80 Units total) into the skin See admin instructions. Take 80 units in the morning and 35 units in the evening. 10 mL 0  .  lactulose (CHRONULAC) 10 GM/15ML solution Take 45 mLs (30 g total) by mouth 2 (two) times daily. Titrate to 3-4 bowel movements daily 1892 mL 5  . metFORMIN (GLUCOPHAGE) 1000 MG tablet Take 1,000 mg by mouth 2 (two) times daily.  11  . Multiple Vitamin (MULTIVITAMIN) capsule Take 1 capsule by mouth daily.      . nadolol (CORGARD) 40 MG tablet Take 1 tablet (40 mg total) by mouth daily. 30 tablet 5  . nicotine (NICODERM CQ - DOSED IN MG/24 HOURS) 14 mg/24hr patch Place 1 patch (14 mg total) onto the skin daily. 28 patch 0  . nitroGLYCERIN (NITROSTAT) 0.4 MG SL tablet Place 1 tablet (0.4 mg total) under the tongue every 5 (five) minutes as needed. 30 tablet 6  . omeprazole (PRILOSEC) 20 MG capsule Take 1 capsule (20 mg total) by mouth  daily. 30 capsule 6  . ranolazine (RANEXA) 500 MG 12 hr tablet Take 2 tablets (1,000 mg total) by mouth 2 (two) times daily. 60 tablet 11  . rifaximin (XIFAXAN) 550 MG TABS tablet Take 1 tablet (550 mg total) by mouth 2 (two) times daily. 24 tablet 0  . spironolactone (ALDACTONE) 25 MG tablet Take 1 tablet (25 mg total) by mouth daily. 30 tablet 0  . [START ON 05/04/2015] sulfamethoxazole-trimethoprim (BACTRIM DS,SEPTRA DS) 800-160 MG tablet Take 1 tablet by mouth daily. Resume in 2 weeks after IV antibiotics have been completed.     No current facility-administered medications for this visit.    Allergies  Allergen Reactions  . Isosorbide     Nose bleeds    Family History  Problem Relation Age of Onset  . Heart attack Brother 9    MI  . Diabetes Brother   . Heart attack Father   . Diabetes Father   . COPD Father   . COPD Mother   . Stroke Neg Hx   . Heart attack Brother     Social History   Social History  . Marital Status: Married    Spouse Name: N/A  . Number of Children: 3  . Years of Education: N/A   Occupational History  . Unemployed Other    Worked in maintenance prior   Social History Main Topics  . Smoking status: Current Every Day Smoker -- 1.00 packs/day for 36 years    Types: Cigarettes, E-cigarettes  . Smokeless tobacco: Current User     Comment: uses vapor cigarettes (2016 )  . Alcohol Use: Not on file  . Drug Use: Not on file  . Sexual Activity: No   Other Topics Concern  . Not on file   Social History Narrative   Married   Gets regular exercise: walking    ROS:  Constitutional: Pt reports fatigue. Denies fever, malaise, headache or abrupt weight changes.  HEENT: Denies eye pain, eye redness, ear pain, ringing in the ears, wax buildup, runny nose, nasal congestion, bloody nose, or sore throat. Respiratory: Denies difficulty breathing, shortness of breath, cough or sputum production.   Cardiovascular: Denies chest pain, chest tightness,  palpitations or swelling in the hands or feet.  Gastrointestinal: Pt reports loose stools. Denies abdominal pain, bloating, constipation, or blood in the stool.  GU: Denies frequency, urgency, pain with urination, blood in urine, odor or discharge. Musculoskeletal: Denies decrease in range of motion, difficulty with gait, muscle pain or joint pain and swelling.  Skin: Denies redness, rashes, lesions or ulcercations.  Neurological: Denies dizziness, difficulty with memory, difficulty with speech or problems with  balance and coordination.  Psych: Denies anxiety, depression, SI/HI.  No other specific complaints in a complete review of systems (except as listed in HPI above).  PE:  BP 108/66 mmHg  Pulse 76  Temp(Src) 97.7 F (36.5 C) (Oral)  Ht 5' 7"  (1.702 m)  Wt 284 lb (128.822 kg)  BMI 44.47 kg/m2  SpO2 98% Wt Readings from Last 3 Encounters:  04/23/15 284 lb (128.822 kg)  04/23/15 279 lb (126.554 kg)  04/20/15 289 lb 3.9 oz (131.2 kg)    General: Appears his stated age, obese, chronically ill appearing in NAD. Skin: Warm, dry and intact. PICC line site CDI. Cardiovascular: Normal rate and rhythm. S1,S2 noted.  No murmur, rubs or gallops noted. 2+ BLE edema noted. Pulmonary/Chest: Normal effort and diminished vesicular breath sounds. No respiratory distress. No wheezes, rales or ronchi noted.  Abdomen: Soft, nontender, active bowel sounds. MSK: No difficulty with gait. Neurological: Alert and oriented.  Psychiatric: He does engage. He does make sexually explicit comments.  BMET    Component Value Date/Time   NA 138 04/20/2015 0413   K 4.0 04/20/2015 0413   CL 104 04/20/2015 0413   CO2 27 04/20/2015 0413   GLUCOSE 81 04/20/2015 0413   BUN 8 04/20/2015 0413   CREATININE 0.79 04/20/2015 0413   CREATININE 0.74 03/15/2011 0827   CALCIUM 8.5* 04/20/2015 0413   GFRNONAA >60 04/20/2015 0413   GFRAA >60 04/20/2015 0413    Lipid Panel     Component Value Date/Time   CHOL  160 02/16/2015 0902   TRIG 95.0 02/16/2015 0902   HDL 47.00 02/16/2015 0902   CHOLHDL 3 02/16/2015 0902   VLDL 19.0 02/16/2015 0902   LDLCALC 94 02/16/2015 0902    CBC    Component Value Date/Time   WBC 3.4* 04/20/2015 0413   RBC 3.31* 04/20/2015 0413   HGB 11.3* 04/20/2015 0413   HCT 33.5* 04/20/2015 0413   PLT 45* 04/20/2015 0413   MCV 101.2* 04/20/2015 0413   MCH 34.1* 04/20/2015 0413   MCHC 33.7 04/20/2015 0413   RDW 13.9 04/20/2015 0413   LYMPHSABS 0.9 04/19/2015 0351   MONOABS 0.4 04/19/2015 0351   EOSABS 0.2 04/19/2015 0351   BASOSABS 0.0 04/19/2015 0351    Hgb A1C Lab Results  Component Value Date   HGBA1C 8.2* 04/14/2015     Assessment and Plan:

## 2015-04-23 NOTE — Assessment & Plan Note (Signed)
S/p MI with stents He will continue Ranexa and Nadaolol Continue ASA

## 2015-04-23 NOTE — Assessment & Plan Note (Signed)
Mild fluid overload on exam Encouraged him to wear TED hose Continue Lasix Will check CBC and CMET today

## 2015-04-23 NOTE — Patient Outreach (Signed)
[facility]) Care Management   04/23/2015  ESSA MALACHI 09-26-1955 [id number]  BRADFORD CAZIER is an 59 y.o. male  Subjective: Pt s/p hospitalization for septicemia. Also has hx of HF, CAD, HTN, DM. Pt states the infection came from his teeth. He states he feels better now than he has in 6 months. Pt has PICC line in place.  Objective:   Review of Systems  Constitutional: Negative.   HENT: Negative.   Eyes: Positive for blurred vision.  Respiratory: Positive for cough and sputum production.   Cardiovascular: Positive for leg swelling.  Gastrointestinal: Positive for diarrhea.  Genitourinary: Negative.   Musculoskeletal: Positive for myalgias.  Skin: Negative.   Neurological: Negative.   Endo/Heme/Allergies: Bruises/bleeds easily.  Psychiatric/Behavioral: Negative.    BP 90/60 mmHg  Pulse 62  Resp 16  Ht 1.702 m (5' 7" )  Wt 279 lb (126.554 kg)  BMI 43.69 kg/m2  SpO2 98% FBS 84  Physical Exam  Constitutional: He is oriented to person, place, and time. He appears well-developed and well-nourished.  HENT:  Head: Normocephalic.  Neck: Normal range of motion.  Cardiovascular: Normal rate and regular rhythm.   Respiratory: Effort normal and breath sounds normal.  GI: Soft. Bowel sounds are normal.  Musculoskeletal: Normal range of motion.  Neurological: He is alert and oriented to person, place, and time.  Skin: Skin is warm and dry.  Psychiatric: He has a normal mood and affect.    Current Medications:   Current Outpatient Prescriptions  Medication Sig Dispense Refill  . aspirin 81 MG tablet Take 81 mg by mouth daily.     Marland Kitchen atorvastatin (LIPITOR) 10 MG tablet Take 1 tablet (10 mg total) by mouth daily. 30 tablet 6  . cefTRIAXone 2 g in dextrose 5 % 50 mL Inject 2 g into the vein daily. Take for 12 days then stop. 24 g 0  . furosemide (LASIX) 40 MG tablet Take 40 mg by mouth daily.     . insulin NPH Human (HUMULIN N,NOVOLIN N) 100 UNIT/ML  injection Inject 0.35-0.8 mLs (35-80 Units total) into the skin See admin instructions. Take 80 units in the morning and 35 units in the evening. 10 mL 0  . lactulose (CHRONULAC) 10 GM/15ML solution Take 45 mLs (30 g total) by mouth 2 (two) times daily. Titrate to 3-4 bowel movements daily 1892 mL 5  . metFORMIN (GLUCOPHAGE) 1000 MG tablet Take 1,000 mg by mouth 2 (two) times daily.  11  . Multiple Vitamin (MULTIVITAMIN) capsule Take 1 capsule by mouth daily.      . nadolol (CORGARD) 40 MG tablet Take 1 tablet (40 mg total) by mouth daily. 30 tablet 5  . nitroGLYCERIN (NITROSTAT) 0.4 MG SL tablet Place 1 tablet (0.4 mg total) under the tongue every 5 (five) minutes as needed. 30 tablet 6  . omeprazole (PRILOSEC) 20 MG capsule Take 1 capsule (20 mg total) by mouth daily. 30 capsule 6  . ranolazine (RANEXA) 500 MG 12 hr tablet Take 2 tablets (1,000 mg total) by mouth 2 (two) times daily. 60 tablet 11  . rifaximin (XIFAXAN) 550 MG TABS tablet Take 1 tablet (550 mg total) by mouth 2 (two) times daily. 24 tablet 0  . spironolactone (ALDACTONE) 25 MG tablet Take 1 tablet (25 mg total) by mouth daily. 30 tablet 0  . nicotine (NICODERM CQ - DOSED IN MG/24 HOURS) 14 mg/24hr patch Place 1 patch (14 mg total) onto the skin daily. (Patient not taking: Reported on  04/23/2015) 28 patch 0  . [START ON 05/04/2015] sulfamethoxazole-trimethoprim (BACTRIM DS,SEPTRA DS) 800-160 MG tablet Take 1 tablet by mouth daily. Resume in 2 weeks after IV antibiotics have been completed. (Patient not taking: Reported on 04/23/2015)     No current facility-administered medications for this visit.    Functional Status:   In your present state of health, do you have any difficulty performing the following activities: 04/14/2015 02/21/2015  Hearing? N N  Vision? N N  Difficulty concentrating or making decisions? N N  Walking or climbing stairs? Y N  Dressing or bathing? N N  Doing errands, shopping? N N    Fall/Depression  Screening:    PHQ 2/9 Scores 04/23/2015  PHQ - 2 Score 4  PHQ- 9 Score 8   Fall Risk  04/23/2015  Risk for fall due to : Impaired balance/gait;Other (Comment)    Assessment:  Resolving septicemia   HF   DM  Plan: Provided Advanced Directives and MOST form.  Provided HF and Diabetes education.  I will call pt weekly over the next month and visit when home health closes his case                   unless pt has a problem, he can call me. [facility] CM Care Plan Problem One        Most Recent Value   Care Plan Problem One  Pt has poor dentition and needs teeth extracted.   Role Documenting the Problem One  Care Management [place] for Problem One  Active   [facility] CM Short Term Goal #1 (0-30 days)  Pt to set up appt with dentist in the next 30 days.   [facility] CM Short Term Goal #1 Start Date  04/23/15   Interventions for Short Term Goal #1  Gave phone # for [facility]    [facility] CM Care Plan Problem Two        Most Recent Value   Care Plan Problem Two  Heart Failure   Role Documenting the Problem Two  Care Management Coordinator   Care Plan for Problem Two  Active   Interventions for Problem Two Long Term Goal   Gave heart failure pt education, and discussed HF zones.    [facility] Long Term Goal (31-90) days  Pt will not be hospitalized in the next 90 days.   [facility] Long Term Goal Start Date  04/23/15    [facility] CM Care Plan Problem Three        Most Recent Value   Care Plan Problem Three  Diabetes   Role Documenting the Problem Three  Care Management Coordinator   Care Plan for Problem Three  Active   [facility] CM Short Term Goal #1 (0-30 days)  Pt and wife to review "All about Carbs." in the next 30 days.   [facility] CM Short Term Goal #1 Start Date  04/23/15     [name] [facility] [place] [phone]

## 2015-04-23 NOTE — Assessment & Plan Note (Signed)
LDL at goal on Lipitor Encouraged him to consume a low fat diet

## 2015-04-23 NOTE — Assessment & Plan Note (Signed)
Secondary to NAFLD With hepatic encephalopathy Continue Lactulose and Xifaxin Continue to follow with GI

## 2015-04-23 NOTE — Assessment & Plan Note (Signed)
Reinforced low carb diet Encouraged him to see an eye doctor annually Will get Prevnar in 1 year No change in treatment regimen at this time

## 2015-04-23 NOTE — Progress Notes (Signed)
Pre visit review using our clinic review tool, if applicable. No additional management support is needed unless otherwise documented below in the visit note. 

## 2015-04-23 NOTE — Assessment & Plan Note (Signed)
Hospital notes reviewed He will continue Rocephin via picc line CBC today

## 2015-04-23 NOTE — Assessment & Plan Note (Signed)
Asymptomatic He will follow up with Cardiology 10/24

## 2015-04-23 NOTE — Assessment & Plan Note (Signed)
No reoccurrence Will continue to monitor

## 2015-04-23 NOTE — Patient Instructions (Signed)
Bacteremia °Bacteremia is the presence of bacteria in the blood. A small amount of bacteria may not cause any symptoms. °Sometimes, the bacteria spread and cause infection in other parts of the body, such as the heart, joints, bones, or brain. Having a great amount of bacteria can cause a serious, sometimes life-threatening infection called sepsis. °CAUSES °This condition is caused by bacteria that get into the blood. Bacteria can enter the blood: °· During a dental or medical procedure. °· After you brush your teeth so hard that the gums bleed. °· Through a scrape or cut on your skin. °More severe types of bacteremia can be caused by: °· A bacterial infection, such as pneumonia, that spreads to the blood. °· Using a dirty needle. °RISK FACTORS °This condition is more likely to develop in: °· Children and elderly adults. °· People who have a long-lasting (chronic) disease or medical condition. °· People who have an artificial joint or heart valve. °· People who have heart valve disease. °· People who have a tube, such as a catheter or IV tube, that has been inserted for a medical treatment. °· People who have a weak body defense system (immune system). °· People who use IV drugs. °SYMPTOMS °Usually, this condition does not cause symptoms when it is mild. When it is more serious, it may cause: °· Fever. °· Chills. °· Racing heart. °· Shortness of breath. °· Dizziness. °· Weakness. °· Confusion. °· Nausea or vomiting. °· Diarrhea. °Bacteremia that has spread to other parts of the body may cause symptoms in those areas. °DIAGNOSIS °This condition may be diagnosed with a physical exam and tests, such as: °· A complete blood count (CBC). This test looks for signs of infection. °· Blood cultures. These look for bacteria in your blood. °· Tests of any IV tubes. These look for a source of infection. °· Urine tests. °· Imaging tests, such as an X-ray, CT scan, MRI, or heart ultrasound. °TREATMENT °If the condition is mild,  treatment is usually not needed. Usually, the body's immune system will remove the bacteria. If the condition is more serious, it may be treated with: °· Antibiotic medicines through an IV tube. These may be given for about 2 weeks. At first, the antibiotic that is given may kill most types of blood bacteria. If your test results show that a certain kind of bacteria is causing problems, the antibiotic may be changed to kill only the bacteria that are causing problems. °· Antibiotics taken by mouth. °· Removing any catheter or IV tube that is a source of infection. °· Blood pressure and breathing support, if needed. °· Surgery to control the source or spread of infection, if needed. °HOME CARE INSTRUCTIONS °· Take over-the-counter and prescription medicines only as told by your health care provider. °· If you were prescribed an antibiotic, take it as told by your health care provider. Do not stop taking the antibiotic even if you start to feel better. °· Rest at home until your condition is under control. °· Drink enough fluid to keep your urine clear or pale yellow. °· Keep all follow-up visits as told by your health care provider. This is important. °PREVENTION °Take these actions to help prevent future episodes of bacteremia: °· Get all vaccinations as recommended by your health care provider. °· Clean and cover scrapes or cuts. °· Bathe regularly. °· Wash your hands often. °· Before any dental or surgical procedure, ask your health care provider if you should take an antibiotic. °SEEK MEDICAL   CARE IF: °· Your symptoms get worse. °· You continue to have symptoms after treatment. °· You develop new symptoms after treatment. °SEEK IMMEDIATE MEDICAL CARE IF: °· You have chest pain or trouble breathing. °· You develop confusion, dizziness, or weakness. °· You develop pale skin. °  °This information is not intended to replace advice given to you by your health care provider. Make sure you discuss any questions you have  with your health care provider. °  °Document Released: 04/10/2006 Document Revised: 03/18/2015 Document Reviewed: 08/30/2014 °[employer]vier Interactive Patient Education ©2016 [employer] ° °

## 2015-04-24 LAB — COMPREHENSIVE METABOLIC PANEL
ALK PHOS: 141 U/L — AB (ref 39–117)
ALT: 28 U/L (ref 0–53)
AST: 67 U/L — ABNORMAL HIGH (ref 0–37)
Albumin: 3 g/dL — ABNORMAL LOW (ref 3.5–5.2)
BILIRUBIN TOTAL: 1.1 mg/dL (ref 0.2–1.2)
BUN: 12 mg/dL (ref 6–23)
CALCIUM: 8.5 mg/dL (ref 8.4–10.5)
CO2: 26 meq/L (ref 19–32)
Chloride: 104 mEq/L (ref 96–112)
Creatinine, Ser: 0.85 mg/dL (ref 0.40–1.50)
GFR: 98.01 mL/min (ref >60.00)
Glucose, Bld: 153 mg/dL — ABNORMAL HIGH (ref 70–99)
POTASSIUM: 4 meq/L (ref 3.5–5.1)
SODIUM: 137 meq/L (ref 135–145)
TOTAL PROTEIN: 6.6 g/dL (ref 6.0–8.3)

## 2015-04-24 LAB — CBC
HCT: 38 % — ABNORMAL LOW (ref 39.0–52.0)
Hemoglobin: 12.9 g/dL — ABNORMAL LOW (ref 13.0–17.0)
MCHC: 34 g/dL (ref 30.0–36.0)
MCV: 103 fl — ABNORMAL HIGH (ref 78.0–100.0)
Platelets: 62 10*3/uL — ABNORMAL LOW (ref 150.0–400.0)
RBC: 3.69 Mil/uL — ABNORMAL LOW (ref 4.22–5.81)
RDW: 15.3 % (ref 11.5–15.5)
WBC: 4.1 10*3/uL (ref 4.0–10.5)

## 2015-04-28 ENCOUNTER — Encounter: Payer: Self-pay | Admitting: *Deleted

## 2015-04-30 ENCOUNTER — Ambulatory Visit: Payer: PPO | Admitting: *Deleted

## 2015-05-02 ENCOUNTER — Encounter (HOSPITAL_COMMUNITY): Payer: Self-pay | Admitting: *Deleted

## 2015-05-02 ENCOUNTER — Emergency Department (HOSPITAL_COMMUNITY)
Admission: EM | Admit: 2015-05-02 | Discharge: 2015-05-03 | Disposition: A | Discharge status: Home / Self Care | Payer: PPO | Attending: Emergency Medicine | Admitting: Emergency Medicine

## 2015-05-02 ENCOUNTER — Emergency Department (HOSPITAL_COMMUNITY): Payer: PPO

## 2015-05-02 DIAGNOSIS — M1990 Unspecified osteoarthritis, unspecified site: Secondary | ICD-10-CM | POA: Insufficient documentation

## 2015-05-02 DIAGNOSIS — I5031 Acute diastolic (congestive) heart failure: Secondary | ICD-10-CM | POA: Insufficient documentation

## 2015-05-02 DIAGNOSIS — Z792 Long term (current) use of antibiotics: Secondary | ICD-10-CM | POA: Diagnosis not present

## 2015-05-02 DIAGNOSIS — Z720 Tobacco use: Secondary | ICD-10-CM | POA: Insufficient documentation

## 2015-05-02 DIAGNOSIS — Z9861 Coronary angioplasty status: Secondary | ICD-10-CM | POA: Insufficient documentation

## 2015-05-02 DIAGNOSIS — Z862 Personal history of diseases of the blood and blood-forming organs and certain disorders involving the immune mechanism: Secondary | ICD-10-CM | POA: Diagnosis not present

## 2015-05-02 DIAGNOSIS — E119 Type 2 diabetes mellitus without complications: Secondary | ICD-10-CM | POA: Insufficient documentation

## 2015-05-02 DIAGNOSIS — Z7982 Long term (current) use of aspirin: Secondary | ICD-10-CM | POA: Insufficient documentation

## 2015-05-02 DIAGNOSIS — G4751 Confusional arousals: Secondary | ICD-10-CM | POA: Diagnosis present

## 2015-05-02 DIAGNOSIS — I10 Essential (primary) hypertension: Secondary | ICD-10-CM | POA: Diagnosis not present

## 2015-05-02 DIAGNOSIS — Q211 Atrial septal defect: Secondary | ICD-10-CM | POA: Insufficient documentation

## 2015-05-02 DIAGNOSIS — K7290 Hepatic failure, unspecified without coma: Secondary | ICD-10-CM | POA: Diagnosis not present

## 2015-05-02 DIAGNOSIS — Z79899 Other long term (current) drug therapy: Secondary | ICD-10-CM | POA: Insufficient documentation

## 2015-05-02 DIAGNOSIS — E785 Hyperlipidemia, unspecified: Secondary | ICD-10-CM | POA: Insufficient documentation

## 2015-05-02 DIAGNOSIS — I2510 Atherosclerotic heart disease of native coronary artery without angina pectoris: Secondary | ICD-10-CM | POA: Insufficient documentation

## 2015-05-02 DIAGNOSIS — I471 Supraventricular tachycardia: Secondary | ICD-10-CM | POA: Insufficient documentation

## 2015-05-02 DIAGNOSIS — Z794 Long term (current) use of insulin: Secondary | ICD-10-CM | POA: Insufficient documentation

## 2015-05-02 DIAGNOSIS — K7682 Hepatic encephalopathy: Secondary | ICD-10-CM

## 2015-05-02 DIAGNOSIS — K7460 Unspecified cirrhosis of liver: Secondary | ICD-10-CM | POA: Insufficient documentation

## 2015-05-02 DIAGNOSIS — I252 Old myocardial infarction: Secondary | ICD-10-CM | POA: Diagnosis not present

## 2015-05-02 DIAGNOSIS — Z9889 Other specified postprocedural states: Secondary | ICD-10-CM | POA: Diagnosis not present

## 2015-05-02 LAB — URINALYSIS, ROUTINE W REFLEX MICROSCOPIC
Bilirubin Urine: NEGATIVE
GLUCOSE, UA: 100 mg/dL — AB
Hgb urine dipstick: NEGATIVE
KETONES UR: 15 mg/dL — AB
Leukocytes, UA: NEGATIVE
NITRITE: NEGATIVE
PH: 7 (ref 5.0–8.0)
Protein, ur: NEGATIVE mg/dL
Specific Gravity, Urine: 1.026 (ref 1.005–1.030)
Urobilinogen, UA: 0.2 mg/dL (ref 0.0–1.0)

## 2015-05-02 LAB — COMPREHENSIVE METABOLIC PANEL
ALBUMIN: 2.6 g/dL — AB (ref 3.5–5.0)
ALK PHOS: 111 U/L (ref 38–126)
ALT: 35 U/L (ref 17–63)
AST: 55 U/L — AB (ref 15–41)
Anion gap: 8 (ref 5–15)
BILIRUBIN TOTAL: 1.6 mg/dL — AB (ref 0.3–1.2)
BUN: 9 mg/dL (ref 6–20)
CALCIUM: 9.1 mg/dL (ref 8.9–10.3)
CO2: 25 mmol/L (ref 22–32)
CREATININE: 0.7 mg/dL (ref 0.61–1.24)
Chloride: 106 mmol/L (ref 101–111)
GFR calc Af Amer: >60 mL/min (ref >60)
GFR calc non Af Amer: >60 mL/min (ref >60)
GLUCOSE: 150 mg/dL — AB (ref 65–99)
Potassium: 4 mmol/L (ref 3.5–5.1)
Sodium: 139 mmol/L (ref 135–145)
TOTAL PROTEIN: 6.5 g/dL (ref 6.5–8.1)

## 2015-05-02 LAB — CBC WITH DIFFERENTIAL/PLATELET
BASOS ABS: 0 10*3/uL (ref 0.0–0.1)
BASOS PCT: 1 %
EOS PCT: 4 %
Eosinophils Absolute: 0.2 10*3/uL (ref 0.0–0.7)
HCT: 35.7 % — ABNORMAL LOW (ref 39.0–52.0)
Hemoglobin: 12.1 g/dL — ABNORMAL LOW (ref 13.0–17.0)
LYMPHS PCT: 28 %
Lymphs Abs: 1.3 10*3/uL (ref 0.7–4.0)
MCH: 34.2 pg — ABNORMAL HIGH (ref 26.0–34.0)
MCHC: 33.9 g/dL (ref 30.0–36.0)
MCV: 100.8 fL — AB (ref 78.0–100.0)
MONO ABS: 0.5 10*3/uL (ref 0.1–1.0)
Monocytes Relative: 10 %
Neutro Abs: 2.6 10*3/uL (ref 1.7–7.7)
Neutrophils Relative %: 57 %
PLATELETS: 66 10*3/uL — AB (ref 150–400)
RBC: 3.54 MIL/uL — ABNORMAL LOW (ref 4.22–5.81)
RDW: 13.7 % (ref 11.5–15.5)
WBC: 4.6 10*3/uL (ref 4.0–10.5)

## 2015-05-02 LAB — AMMONIA: Ammonia: 99 umol/L — ABNORMAL HIGH (ref 9–35)

## 2015-05-02 LAB — I-STAT CG4 LACTIC ACID, ED
Lactic Acid, Venous: 1.1 mmol/L (ref 0.5–2.0)
Lactic Acid, Venous: 1.61 mmol/L (ref 0.5–2.0)

## 2015-05-02 MED ORDER — LACTULOSE 10 GM/15ML PO SOLN
10.0000 g | Freq: Once | ORAL | Status: AC
  Administered 2015-05-02: 10 g via ORAL
  Filled 2015-05-02: qty 15

## 2015-05-02 MED ORDER — SODIUM CHLORIDE 0.9 % IV BOLUS (SEPSIS)
1000.0000 mL | Freq: Once | INTRAVENOUS | Status: AC
  Administered 2015-05-02: 1000 mL via INTRAVENOUS

## 2015-05-02 NOTE — ED Provider Notes (Signed)
CSN: [id number]     Arrival date & time 05/02/15  1750 History   First MD Initiated Contact with Patient 05/02/15 1811     No chief complaint on file.    (Consider location/radiation/quality/duration/timing/severity/associated sxs/prior Treatment) HPI Comments: 59 yo M with medical history significant for cirrhosis 2/2 NASH with multiple admissions for hepatic encephalopathy, DM, CAD s/p PCI 2992 and diastolic CHF who presents to the ED with confusion.  Of note, patient was recently admitted 04/13/2015 for hepatic encephalopathy and sepsis 2/2 strep vidrians bacteremia and was discharged on 04/20/2015.  Per patient's wife, when Adam Butler woke up this morning, he was somewhat confused and lethargic. She mentioned that he was behaving in a manner similar to when he had hepatic encephalopathy in the past.  The patient slept for most of the morning and when his wife attempted to wake him this afternoon his confusion has worsened and he was not oriented to place or time.  His wife then called EMS and gave him an additional dose of Lactulose.  She noted that his mentation began to improve and he was far more alert when EMS arrived.  I  n the ED, patient was alert and oriented x3.  He denies any infectious symptoms including fever, chills, cough, SOB, HA, neck pain, dysuria, N/V and only reports recent dark, tarry loose bowel movements. Today, Adam Butler completed his last day of IV Rocephin and still has a PICC line in his right arm.    ROS 10 Systems reviewed and are negative for acute change except as noted in the HPI.     The history is provided by the patient.    Past Medical History  Diagnosis Date  . Cirrhosis ([place]) 2011    Cryptogenic, Likely NASH. Family/pt deny EtOH. HCV, HBV, HAV negative. ANA negative. AMA positive. Ascites 12/11  . Hyperlipemia   . Coronary artery disease     Inferior MI 12/11; LHC with occluded mid CFX and 80% proximal RCA. EF 55%. He had 3.0 x 28 vision BMS  to CFX  . Diastolic CHF, acute ([place])     Echo 12/11 with ef 50-55% and mild LVH. EF 55% by LV0gram in 12/11  . Hypertension   . Type II diabetes mellitus ([place]) 2011  . SVT (supraventricular tachycardia) ([place])     1/12: appeared to be an ectopic atrial tachycardia. Required DCCV with hemodynamic instability  . GI bleed     12/11: Etiology not clearly defined. EGD: nonbleeding esophageal varices.  . S/P coronary artery stent placement 06/2010  . Esophageal varices ([place]) 2011, 2013    no hx acute variceal bleed  . Hepatic encephalopathy ([place]) 2011, 12/2013  . Myocardial infarction [facility]) ? 2012  . Shortness of breath dyspnea   . Arthritis   . Hx of echocardiogram     Echo 5/16:  EF 55-60%, no RWMA, mod LAE  . Thrombocytopenia ([place])   . Cholelithiasis   . Rectal varices   . PFO (patent foramen ovale): Per TEE 04/20/2015 04/20/2015  . Bacteremia    Past Surgical History  Procedure Laterality Date  . Cardiac catheterization  07/01/2010    BMS to CFX.  Marland Kitchen Appendectomy    . Orif r leg    . Esophagogastroduodenoscopy  04/13/2012    Procedure: ESOPHAGOGASTRODUODENOSCOPY (EGD);  Surgeon: Inda Castle, MD;  Location: [facility];  Service: Endoscopy;  Laterality: N/A;  . Colonoscopy  04/13/2012    Procedure: COLONOSCOPY;  Surgeon: Inda Castle, MD;  Location: [facility] ENDOSCOPY;  Service: Endoscopy;  Laterality: N/A;  . Esophagogastroduodenoscopy N/A 01/01/2014    Procedure: ESOPHAGOGASTRODUODENOSCOPY (EGD);  Surgeon: Jerene Bears, MD;  Location: [facility] ENDOSCOPY;  Service: Endoscopy;  Laterality: N/A;  . Esophageal banding N/A 01/01/2014    Procedure: ESOPHAGEAL BANDING;  Surgeon: Jerene Bears, MD;  Location: [facility] ENDOSCOPY;  Service: Endoscopy;  Laterality: N/A;  . Coronary stent placement  06/30/2010    CFX   Distal        . Left heart catheterization with coronary angiogram N/A 10/15/2014    Procedure: LEFT HEART CATHETERIZATION WITH CORONARY ANGIOGRAM;  Surgeon: Peter M Martinique, MD;  Location[facility]  CATH LAB;  Service: Cardiovascular;  Laterality: N/A;  . Tee without cardioversion N/A 04/20/2015    Procedure: TRANSESOPHAGEAL ECHOCARDIOGRAM (TEE);  Surgeon: Fay Records, MD;  Location: [facility] ENDOSCOPY;  Service: Cardiovascular;  Laterality: N/A;   Family History  Problem Relation Age of Onset  . Heart attack Brother 55    MI  . Diabetes Brother   . Heart attack Father   . Diabetes Father   . COPD Father   . COPD Mother   . Stroke Neg Hx   . Heart attack Brother    Social History  Substance Use Topics  . Smoking status: Current Every Day Smoker -- 1.00 packs/day for 36 years    Types: E-cigarettes  . Smokeless tobacco: Current User     Comment: uses vapor cigarettes (2016 )  . Alcohol Use: No    Review of Systems    Allergies  Isosorbide and Lactose intolerance (gi)  Home Medications   Prior to Admission medications   Medication Sig Start Date End Date Taking? Authorizing Provider  aspirin 81 MG tablet Take 81 mg by mouth daily.    Yes Historical Provider, MD  atorvastatin (LIPITOR) 10 MG tablet Take 1 tablet (10 mg total) by mouth daily. Patient taking differently: Take 10 mg by mouth daily at 6 PM.  01/15/15  Yes Larey Dresser, MD  cefTRIAXone 2 g in dextrose 5 % 50 mL Inject 2 g into the vein daily. Take for 12 days then stop. 04/20/15 05/02/15 Yes Eugenie Filler, MD  furosemide (LASIX) 40 MG tablet Take 40 mg by mouth daily.  01/09/12  Yes Larey Dresser, MD  insulin NPH Human (HUMULIN N,NOVOLIN N) 100 UNIT/ML injection Inject 0.35-0.8 mLs (35-80 Units total) into the skin See admin instructions. Take 80 units in the morning and 35 units in the evening. 04/20/15  Yes Eugenie Filler, MD  lactulose (CHRONULAC) 10 GM/15ML solution Take 45 mLs (30 g total) by mouth 2 (two) times daily. Titrate to 3-4 bowel movements daily Patient taking differently: Take 30 g by mouth daily. Titrate to 3-4 bowel movements daily 02/01/15  Yes Corky Sox, MD  metFORMIN (GLUCOPHAGE)  1000 MG tablet Take 1,000 mg by mouth 2 (two) times daily. 11/27/14  Yes Historical Provider, MD  Multiple Vitamin (MULTIVITAMIN) capsule Take 1 capsule by mouth daily.     Yes Historical Provider, MD  nadolol (CORGARD) 40 MG tablet Take 1 tablet (40 mg total) by mouth daily. 01/01/15  Yes Jerene Bears, MD  nitroGLYCERIN (NITROSTAT) 0.4 MG SL tablet Place 1 tablet (0.4 mg total) under the tongue every 5 (five) minutes as needed. 01/09/12  Yes Larey Dresser, MD  omeprazole (PRILOSEC) 20 MG capsule Take 1 capsule (20 mg total) by mouth daily. 01/15/15  Yes Larey Dresser, MD  ranolazine (  RANEXA) 500 MG 12 hr tablet Take 2 tablets (1,000 mg total) by mouth 2 (two) times daily. 01/15/15  Yes Larey Dresser, MD  rifaximin (XIFAXAN) 550 MG TABS tablet Take 1 tablet (550 mg total) by mouth 2 (two) times daily. 02/13/15  Yes Jerene Bears, MD  spironolactone (ALDACTONE) 25 MG tablet Take 1 tablet (25 mg total) by mouth daily. 04/20/15  Yes Eugenie Filler, MD  nicotine (NICODERM CQ - DOSED IN MG/24 HOURS) 14 mg/24hr patch Place 1 patch (14 mg total) onto the skin daily. 04/20/15   Eugenie Filler, MD  sulfamethoxazole-trimethoprim (BACTRIM DS,SEPTRA DS) 800-160 MG tablet Take 1 tablet by mouth daily. Resume in 2 weeks after IV antibiotics have been completed. 05/04/15   Eugenie Filler, MD   BP 125/57 mmHg  Pulse 55  Temp(Src) 98.8 F (37.1 C) (Oral)  Resp 16  Ht 5' 7"  (1.702 m)  Wt 284 lb (128.822 kg)  BMI 44.47 kg/m2  SpO2 100% Physical Exam  Constitutional: He is oriented to person, place, and time. He appears well-developed.  HENT:  Head: Normocephalic and atraumatic.  Eyes: Conjunctivae and EOM are normal. Pupils are equal, round, and reactive to light.  Neck: Normal range of motion. Neck supple.  Cardiovascular: Normal rate, regular rhythm and normal heart sounds.   Pulmonary/Chest: Effort normal and breath sounds normal. No respiratory distress. He has no wheezes.  Abdominal: Soft. Bowel  sounds are normal. He exhibits no distension. There is no tenderness. There is no rebound and no guarding.  Neurological: He is alert and oriented to person, place, and time. No cranial nerve deficit. Coordination normal.  Skin: Skin is warm.  Nursing note and vitals reviewed.   ED Course  Procedures (including critical care time) Labs Review Labs Reviewed  COMPREHENSIVE METABOLIC PANEL - Abnormal; Notable for the following:    Glucose, Bld 150 (*)    Albumin 2.6 (*)    AST 55 (*)    Total Bilirubin 1.6 (*)    All other components within normal limits  CBC WITH DIFFERENTIAL/PLATELET - Abnormal; Notable for the following:    RBC 3.54 (*)    Hemoglobin 12.1 (*)    HCT 35.7 (*)    MCV 100.8 (*)    MCH 34.2 (*)    Platelets 66 (*)    All other components within normal limits  URINALYSIS, ROUTINE W REFLEX MICROSCOPIC (NOT AT [facility]) - Abnormal; Notable for the following:    Color, Urine AMBER (*)    Glucose, UA 100 (*)    Ketones, ur 15 (*)    All other components within normal limits  AMMONIA - Abnormal; Notable for the following:    Ammonia 99 (*)    All other components within normal limits  CULTURE, BLOOD (ROUTINE X 2)  CULTURE, BLOOD (ROUTINE X 2)  URINE CULTURE  I-STAT CG4 LACTIC ACID, ED  I-STAT CG4 LACTIC ACID, ED    Imaging Review Dg Chest 2 View  05/02/2015  CLINICAL DATA:  Altered mental status. History of healthcare acquired pneumonia. EXAM: CHEST  2 VIEW COMPARISON:  04/14/2015 FINDINGS: Tip of the right upper extremity PICC in the mid proximal SVC cardiomediastinal contours are unchanged, heart at the upper limits of normal in size. Improving pulmonary edema with persistent vascular congestion. No confluent airspace disease, pleural effusion or pneumothorax. Degenerative change in the thoracic spine. IMPRESSION: 1. No acute process. 2. Tip of the right upper extremity PICC in the SVC. Electronically Signed  By: Jeb Levering M.D.   On: 05/02/2015 20:08   I have  personally reviewed and evaluated these images and lab results as part of my medical decision-making.   EKG Interpretation None      MDM   Final diagnoses:  Cirrhosis of liver without ascites, unspecified hepatic cirrhosis type ([place])  Hepatic encephalopathy ([facility])    Pt comes in with cc of confusion. He is however alert and oriented now, and very sharp. Clinically, he has no hepatic encephalopathy anymore, he was given lactulose this evening. PT has no signs of dehydration, and he has no signs of infection. Hx not suggestive of infections either.  Will monitor. Anticipate d/c.     Varney Biles, MD 05/02/15 2350

## 2015-05-02 NOTE — Discharge Instructions (Signed)
Hepatic Encephalopathy Hepatic encephalopathy is a loss of brain function from advanced liver disease. The effects of the condition depend on the type of liver damage and how severe it is. In some cases, hepatic encephalopathy can be reversed. CAUSES The exact cause of hepatic encephalopathy is not known. RISK FACTORS You have a higher risk of getting this condition if your liver is damaged. When the liver is damaged harmful substances called toxins can build up in the body. Certain toxins, such as ammonia, can harm your brain. Conditions that can cause liver damage include:  An infection.  Dehydration.  Intestinal bleeding.  Drinking too much alcohol.  Taking certain medicines, including tranquilizers, water pills (diuretics), antidepressants, or sleeping pills. SIGNS AND SYMPTOMS Signs and symptoms may develop suddenly. Or, they may develop slowly and get worse gradually. Symptoms can range from mild to severe. Mild Hepatic Encephalopathy  Mild confusion.  Personality and mood changes.  Anxiety and agitation.  Drowsiness.  Loss of mental abilities.  Musty or sweet-smelling breath. Worsening or Severe Hepatic Encephalopathy  Slowed movement.  Slurred speech.  Extreme personality changes.  Disorientation.  Abnormal shaking or flapping of the hands.  Coma. DIAGNOSIS To make a diagnosis, your health care provider will do a physical exam. To rule out other causes of your signs and symptoms, he or she may order tests. You may have:  Blood tests. These may be done to check your ammonia level, measure how long it takes your blood to clot, and check for infection.  Liver function tests. These may be done to check how well your liver is working.  MRI and CT scans. These may be done to check for a brain disorder.  Electroencephalogram (EEG). This may be done to measure the electrical activity in your brain. TREATMENT The first step in treatment is identifying and  treating possible triggers. The next step is involves taking medicine to lower the level of toxins in the body and to prevent ammonia from building up. You may need to take:  Antibiotics to reduce the ammonia-producing bacteria in your gut.  Lactulose to help flush ammonia from the gut. HOME CARE INSTRUCTIONS Eating and Drinking  Follow a low-protein diet that includes plenty of fruits, vegetables, and whole grains, as directed by your health care provider. Ammonia is produced when you digest high-protein foods.  Work with a Microbiologist or with your health care provider to make sure you are getting the right balance of protein and minerals.  Drink enough fluids to keep your urine clear or pale yellow. Drinking plenty of water helps prevent constipation.  Do not drink alcohol or use illegal drugs. Medicines  Only take medicine as directed by your health care provider.  If you were prescribed an antibiotic medicine, finish it all even if you start to feel better.  Do not start any new medicines, including over-the-counter medicines, without first checking with your health care provider. SEEK MEDICAL CARE IF:  You have new symptoms.  Your symptoms change.  Your symptoms get worse.  You have a fever.  You are constipated.  You have persistent nausea, vomiting, or diarrhea. SEEK IMMEDIATE MEDICAL CARE IF:  You become very confused or drowsy.  You vomit blood or material that looks like coffee grounds.  Your stool is bloody or black or looks like tar.   This information is not intended to replace advice given to you by your health care provider. Make sure you discuss any questions you have with your health care provider.  Document Released: 09/06/2006 Document Revised: 07/18/2014 Document Reviewed: 02/12/2014 Elsevier Interactive Patient Education [employer].

## 2015-05-02 NOTE — ED Notes (Signed)
Pt presents via [facility] for confusion beginning today, pt states he gets combative when his ammonia levels are high, Hx: liver cirrhosis.  Pt answers questions appropriately and a x 4.  Pt has PICC line and just finished abx, pt unsure what type of abx of what for.  BP-120/64 P-74 R-16 CHG-209.

## 2015-05-02 NOTE — ED Notes (Signed)
Pt ambulated successfully to restroom and back without assistance.

## 2015-05-02 NOTE — ED Notes (Signed)
MD at bedside. 

## 2015-05-02 NOTE — ED Notes (Signed)
Pt AxO X 4, with periods of mild confusion per wife. Pt joking with staff denies fevers, denies pain, denies dysuria or cough.

## 2015-05-04 ENCOUNTER — Ambulatory Visit (INDEPENDENT_AMBULATORY_CARE_PROVIDER_SITE_OTHER): Payer: PPO | Admitting: Physician Assistant

## 2015-05-04 ENCOUNTER — Encounter: Payer: Self-pay | Admitting: Physician Assistant

## 2015-05-04 VITALS — BP 128/74 | HR 63 | Ht 67.0 in | Wt 279.6 lb

## 2015-05-04 DIAGNOSIS — R7881 Bacteremia: Secondary | ICD-10-CM | POA: Diagnosis not present

## 2015-05-04 DIAGNOSIS — I2510 Atherosclerotic heart disease of native coronary artery without angina pectoris: Secondary | ICD-10-CM | POA: Diagnosis not present

## 2015-05-04 DIAGNOSIS — E785 Hyperlipidemia, unspecified: Secondary | ICD-10-CM

## 2015-05-04 DIAGNOSIS — I5032 Chronic diastolic (congestive) heart failure: Secondary | ICD-10-CM | POA: Diagnosis not present

## 2015-05-04 DIAGNOSIS — Q211 Atrial septal defect: Secondary | ICD-10-CM | POA: Diagnosis not present

## 2015-05-04 DIAGNOSIS — Q2112 Patent foramen ovale: Secondary | ICD-10-CM

## 2015-05-04 DIAGNOSIS — Z720 Tobacco use: Secondary | ICD-10-CM

## 2015-05-04 LAB — URINE CULTURE: Culture: 5000

## 2015-05-04 NOTE — Assessment & Plan Note (Signed)
Tolerating Lipitor

## 2015-05-04 NOTE — Addendum Note (Signed)
Addended by: Imogene Burn on: 05/04/2015 02:23 PM   Modules accepted: Level of Service

## 2015-05-04 NOTE — Assessment & Plan Note (Signed)
Patient has large PFO found on TEE 04/20/15. He is asymptomatic with this. It can be increased risk of stroke that he has had GI bleeds in the past requiring transfusions. I will send this visit as well as TEE for Dr. Aundra Dubin to review and give any further recommendations.

## 2015-05-04 NOTE — Patient Instructions (Signed)
Medication Instructions:  Your physician recommends that you continue on your current medications as directed. Please refer to the Current Medication list given to you today.   Labwork: None ordered  Testing/Procedures: None ordered  Follow-Up: Your physician wants you to follow-up in: 2 MONTHS WITH DR. Aundra Dubin.  You will receive a reminder letter in the mail two months in advance. If you don't receive a letter, please call our office to schedule the follow-up appointment.   Any Other Special Instructions Will Be Listed Below (If Applicable).     If you need a refill on your cardiac medications before your next appointment, please call your pharmacy.

## 2015-05-04 NOTE — Assessment & Plan Note (Signed)
Just finished IV Rocephin

## 2015-05-04 NOTE — Assessment & Plan Note (Signed)
Stable without angina

## 2015-05-04 NOTE — Assessment & Plan Note (Signed)
Patient continues to smoke. Smoking cessation discussed.

## 2015-05-04 NOTE — Assessment & Plan Note (Addendum)
Heart Failure currently compensated

## 2015-05-04 NOTE — Progress Notes (Signed)
Cardiology Office Note   Date:  05/04/2015   ID:  Adam Butler Dec 23, 1955, MRN [id number]  PCP:  Adam Silversmith, NP  Cardiologist: Dr. Aundra Dubin  Chief Complaint: Post hospital follow-up    History of Present Illness: Adam Butler is a 59 y.o. male who presents for post hospital follow-up. He was just discharged  04/20/15 after admission with severe sepsis due to healthcare associated pneumonia, gram-positive cocci bacteremia sent home on IV Rocephin. 2-D echo showed EF of 55-60% with trivial aortic regurgitation mildly dilated LA and RA. He had a TEE which was negative for vegetations however it did show a large PFO. Patient returned to the emergency room 05/02/15 with confusion. It cleared by the time he got there and all labs were stable.  He has a history of CAD status post inferior MI in 06/2010. L HC showed an occluded mid circumflex and 80% proximal RCA EF 55% had a bare-metal stent to the circumflex. Lexi scan Myoview 01/2011 EF 54% with moderate inferolateral scar and no ischemia. Unstable angina in 10/2014 with chronic total occlusion of the RCA EF 45-50%. He has chronic angina and doesn't tolerate them door. He also has diastolic heart failure EF 55-60% on echo on 11/2014. He has hypertension, diabetes mellitus, cirrhosis secondary to NASH. History of SVT in 2012 felt to be ectopic atrial tachycardia requiring DC CV, history of GI bleed etiology not clearly defined EGD showed nonbleeding esophageal varices.   Patient has poor dentition says he needs several teeth pulled. He denies any chest pain, palpitations, dyspnea, dyspnea on exertion, dizziness or presyncope. He is asking me to sign a paper for him to be relieved of jury duty. He says he takes lactulose and is running to the bathroom constantly. Unfortunately he is still smoking about 2 cigarettes daily.   Past Medical History  Diagnosis Date  . Cirrhosis ([place]) 2011    Cryptogenic, Likely NASH. Family/pt deny EtOH. HCV,  HBV, HAV negative. ANA negative. AMA positive. Ascites 12/11  . Hyperlipemia   . Coronary artery disease     Inferior MI 12/11; LHC with occluded mid CFX and 80% proximal RCA. EF 55%. He had 3.0 x 28 vision BMS to CFX  . Diastolic CHF, acute ([place])     Echo 12/11 with ef 50-55% and mild LVH. EF 55% by LV0gram in 12/11  . Hypertension   . Type II diabetes mellitus ([place]) 2011  . SVT (supraventricular tachycardia) ([place])     1/12: appeared to be an ectopic atrial tachycardia. Required DCCV with hemodynamic instability  . GI bleed     12/11: Etiology not clearly defined. EGD: nonbleeding esophageal varices.  . S/P coronary artery stent placement 06/2010  . Esophageal varices ([place]) 2011, 2013    no hx acute variceal bleed  . Hepatic encephalopathy ([place]) 2011, 12/2013  . Myocardial infarction [facility]) ? 2012  . Shortness of breath dyspnea   . Arthritis   . Hx of echocardiogram     Echo 5/16:  EF 55-60%, no RWMA, mod LAE  . Thrombocytopenia ([place])   . Cholelithiasis   . Rectal varices   . PFO (patent foramen ovale): Per TEE 04/20/2015 04/20/2015  . Bacteremia     Past Surgical History  Procedure Laterality Date  . Cardiac catheterization  07/01/2010    BMS to CFX.  Marland Kitchen Appendectomy    . Orif r leg    . Esophagogastroduodenoscopy  04/13/2012    Procedure: ESOPHAGOGASTRODUODENOSCOPY (EGD);  Surgeon: Inda Castle,  MD;  Location: [facility] ENDOSCOPY;  Service: Endoscopy;  Laterality: N/A;  . Colonoscopy  04/13/2012    Procedure: COLONOSCOPY;  Surgeon: Inda Castle, MD;  Location: [facility] ENDOSCOPY;  Service: Endoscopy;  Laterality: N/A;  . Esophagogastroduodenoscopy N/A 01/01/2014    Procedure: ESOPHAGOGASTRODUODENOSCOPY (EGD);  Surgeon: Jerene Bears, MD;  Location: [facility] ENDOSCOPY;  Service: Endoscopy;  Laterality: N/A;  . Esophageal banding N/A 01/01/2014    Procedure: ESOPHAGEAL BANDING;  Surgeon: Jerene Bears, MD;  Location: [facility] ENDOSCOPY;  Service: Endoscopy;  Laterality: N/A;  . Coronary stent  placement  06/30/2010    CFX   Distal        . Left heart catheterization with coronary angiogram N/A 10/15/2014    Procedure: LEFT HEART CATHETERIZATION WITH CORONARY ANGIOGRAM;  Surgeon: Peter M Martinique, MD;  Location: [facility] CATH LAB;  Service: Cardiovascular;  Laterality: N/A;  . Tee without cardioversion N/A 04/20/2015    Procedure: TRANSESOPHAGEAL ECHOCARDIOGRAM (TEE);  Surgeon: Fay Records, MD;  Location: [facility] ENDOSCOPY;  Service: Cardiovascular;  Laterality: N/A;     Current Outpatient Prescriptions  Medication Sig Dispense Refill  . aspirin 81 MG tablet Take 81 mg by mouth daily.     Marland Kitchen atorvastatin (LIPITOR) 10 MG tablet Take 10 mg by mouth daily at 6 PM.    . furosemide (LASIX) 40 MG tablet Take 40 mg by mouth daily.     . insulin NPH Human (HUMULIN N,NOVOLIN N) 100 UNIT/ML injection Inject 0.35-0.8 mLs (35-80 Units total) into the skin See admin instructions. Take 80 units in the morning and 35 units in the evening. 10 mL 0  . lactulose (CHRONULAC) 10 GM/15ML solution Take 30 g by mouth 3 (three) times daily. May take an additional dose as needed for bowel movement    . metFORMIN (GLUCOPHAGE) 1000 MG tablet Take 1,000 mg by mouth 2 (two) times daily.  11  . Multiple Vitamin (MULTIVITAMIN) capsule Take 1 capsule by mouth daily.      . nadolol (CORGARD) 40 MG tablet Take 1 tablet (40 mg total) by mouth daily. 30 tablet 5  . nicotine (NICODERM CQ - DOSED IN MG/24 HOURS) 14 mg/24hr patch Place 1 patch (14 mg total) onto the skin daily. 28 patch 0  . nitroGLYCERIN (NITROSTAT) 0.4 MG SL tablet Place 0.4 mg under the tongue every 5 (five) minutes as needed for chest pain (chest pain).    Marland Kitchen omeprazole (PRILOSEC) 20 MG capsule Take 1 capsule (20 mg total) by mouth daily. 30 capsule 6  . ranolazine (RANEXA) 500 MG 12 hr tablet Take 2 tablets (1,000 mg total) by mouth 2 (two) times daily. 60 tablet 11  . rifaximin (XIFAXAN) 550 MG TABS tablet Take 1 tablet (550 mg total) by mouth 2 (two) times daily.  24 tablet 0  . spironolactone (ALDACTONE) 25 MG tablet Take 1 tablet (25 mg total) by mouth daily. 30 tablet 0  . sulfamethoxazole-trimethoprim (BACTRIM DS,SEPTRA DS) 800-160 MG tablet Take 1 tablet by mouth daily. Resume in 2 weeks after IV antibiotics have been completed.     No current facility-administered medications for this visit.    Allergies:   Isosorbide and Lactose intolerance (gi)    Social History:  The patient  reports that he has been smoking E-cigarettes.  He has a 36 pack-year smoking history. He uses smokeless tobacco. He reports that he does not drink alcohol or use illicit drugs.   Family History:  The patient's    family history includes COPD  in his father and mother; Diabetes in his brother and father; Heart attack in his brother and father; Heart attack (age of onset: 40) in his brother. There is no history of Stroke.    ROS:  Please see the history of present illness.   Otherwise, review of systems are positive for occasional leg swelling, chronic cough dyspnea on exertion chronic back pain All other systems are reviewed and negative.    PHYSICAL EXAM: VS:  BP 128/74 mmHg  Pulse 63  Ht 5' 7"  (1.702 m)  Wt 279 lb 9.6 oz (126.826 kg)  BMI 43.78 kg/m2 , BMI Body mass index is 43.78 kg/(m^2). GEObese,in no acute distress Neck: no JVD, HJR, carotid bruits, or masses Cardiac: RRR;  distant heart sounds,no murmurs,gallop, rubs, thrill or heave,  Respiratory:  clear to auscultation bilaterally, normal work of breathing GI: soft, nontender, nondistended, + BS MS: no deformity or atrophy Extremities: without cyanosis, clubbing, edema, good distal pulses bilaterally.  Skin: warm and dry, no rash Neuro:  Strength and sensation are intact    EKG:  EKG is ordered today. The ekg ordered today denormal sinus rhythm inferior Q waves, no acute change   Recent Labs: 10/14/2014: B Natriuretic Peptide 56.1 12/11/2014: Pro B Natriuretic peptide (BNP) 38.0 04/17/2015: Magnesium  1.9 05/02/2015: ALT 35; BUN 9; Creatinine, Ser 0.70; Hemoglobin 12.1*; Platelets 66*; Potassium 4.0; Sodium 139    Lipid Panel    Component Value Date/Time   CHOL 160 02/16/2015 0902   TRIG 95.0 02/16/2015 0902   HDL 47.00 02/16/2015 0902   CHOLHDL 3 02/16/2015 0902   VLDL 19.0 02/16/2015 0902   LDLCALC 94 02/16/2015 0902      Wt Readings from Last 3 Encounters:  05/04/15 279 lb 9.6 oz (126.826 kg)  05/02/15 284 lb (128.822 kg)  04/23/15 284 lb (128.822 kg)      Other studies Reviewed: Additional studies/ records that were reviewed today include and review of the records demonstrates:  Study Conclusions  - Left ventricle: The cavity size was normal. Wall thickness was   increased in a pattern of mild LVH. Systolic function was normal.   The estimated ejection fraction was in the range of 55% to 60%. - Aortic valve: There was trivial regurgitation. - Mitral valve: Calcified annulus. Mildly thickened leaflets . - Left atrium: The atrium was mildly dilated. - Right atrium: The atrium was mildly dilated.  TEE 04/20/15  Impressions:  - Poor acoustiic limit study Not adequate to evalaute for   endocarditis. REcomm TEE if clinically indicated. TV normal  Mild to mod TR MV normal  Trace MR AV normal  No AI PV normal Large PFO present as tested with injection of agitated salline   Mild fixed plaquing of the thoracic aorta               ASSESSMENT AND PLAN: PFO (patent foramen ovale): Per TEE 04/20/2015 Patient has large PFO found on TEE 04/20/15. He is asymptomatic with this. It can be increased risk of stroke that he has had GI bleeds in the past requiring transfusions. I will send this visit as well as TEE for Dr. Aundra Dubin to review and give any further recommendations.  DIASTOLIC HEART FAILURE, CHRONIC Heart Failure currently compensated  CAD, NATIVE VESSEL Stable without angina  Bacteremia Just finished IV Rocephin  HLD (hyperlipidemia) Tolerating  Lipitor  Tobacco abuse Patient continues to smoke. Smoking cessation discussed.     Signed, Ermalinda Barrios, PA-C  05/04/2015 1:08 PM  [facility] [address], [place], [place]  [address] Phone: [phone]; Fax: [phone]

## 2015-05-05 ENCOUNTER — Telehealth: Payer: Self-pay | Admitting: *Deleted

## 2015-05-05 NOTE — Telephone Encounter (Signed)
He has hx of cirrhosis. His past labs suggests that the 70 is his baseline. i saw him in the hosp and recommended iv abtx. He should have appt

## 2015-05-05 NOTE — Telephone Encounter (Signed)
[place] regarding an alert platelet of 70 (no one had called here). Advised them that the patient has not established at [facility] and does not have a follow up. Asked them to notify the PCP regarding his low platelet. Does patient need a follow up at [facility].

## 2015-05-06 ENCOUNTER — Encounter: Payer: Self-pay | Admitting: Internal Medicine

## 2015-05-06 ENCOUNTER — Ambulatory Visit (INDEPENDENT_AMBULATORY_CARE_PROVIDER_SITE_OTHER): Payer: PPO | Admitting: Internal Medicine

## 2015-05-06 VITALS — BP 102/54 | HR 64 | Temp 97.5°F | Wt 279.0 lb

## 2015-05-06 DIAGNOSIS — K7290 Hepatic failure, unspecified without coma: Secondary | ICD-10-CM

## 2015-05-06 DIAGNOSIS — K7682 Hepatic encephalopathy: Secondary | ICD-10-CM

## 2015-05-06 DIAGNOSIS — K7581 Nonalcoholic steatohepatitis (NASH): Secondary | ICD-10-CM

## 2015-05-06 NOTE — Patient Instructions (Signed)
Hepatic Encephalopathy Hepatic encephalopathy is a loss of brain function from advanced liver disease. The effects of the condition depend on the type of liver damage and how severe it is. In some cases, hepatic encephalopathy can be reversed. CAUSES The exact cause of hepatic encephalopathy is not known. RISK FACTORS You have a higher risk of getting this condition if your liver is damaged. When the liver is damaged harmful substances called toxins can build up in the body. Certain toxins, such as ammonia, can harm your brain. Conditions that can cause liver damage include:  An infection.  Dehydration.  Intestinal bleeding.  Drinking too much alcohol.  Taking certain medicines, including tranquilizers, water pills (diuretics), antidepressants, or sleeping pills. SIGNS AND SYMPTOMS Signs and symptoms may develop suddenly. Or, they may develop slowly and get worse gradually. Symptoms can range from mild to severe. Mild Hepatic Encephalopathy  Mild confusion.  Personality and mood changes.  Anxiety and agitation.  Drowsiness.  Loss of mental abilities.  Musty or sweet-smelling breath. Worsening or Severe Hepatic Encephalopathy  Slowed movement.  Slurred speech.  Extreme personality changes.  Disorientation.  Abnormal shaking or flapping of the hands.  Coma. DIAGNOSIS To make a diagnosis, your health care provider will do a physical exam. To rule out other causes of your signs and symptoms, he or she may order tests. You may have:  Blood tests. These may be done to check your ammonia level, measure how long it takes your blood to clot, and check for infection.  Liver function tests. These may be done to check how well your liver is working.  MRI and CT scans. These may be done to check for a brain disorder.  Electroencephalogram (EEG). This may be done to measure the electrical activity in your brain. TREATMENT The first step in treatment is identifying and  treating possible triggers. The next step is involves taking medicine to lower the level of toxins in the body and to prevent ammonia from building up. You may need to take:  Antibiotics to reduce the ammonia-producing bacteria in your gut.  Lactulose to help flush ammonia from the gut. HOME CARE INSTRUCTIONS Eating and Drinking  Follow a low-protein diet that includes plenty of fruits, vegetables, and whole grains, as directed by your health care provider. Ammonia is produced when you digest high-protein foods.  Work with a Microbiologist or with your health care provider to make sure you are getting the right balance of protein and minerals.  Drink enough fluids to keep your urine clear or pale yellow. Drinking plenty of water helps prevent constipation.  Do not drink alcohol or use illegal drugs. Medicines  Only take medicine as directed by your health care provider.  If you were prescribed an antibiotic medicine, finish it all even if you start to feel better.  Do not start any new medicines, including over-the-counter medicines, without first checking with your health care provider. SEEK MEDICAL CARE IF:  You have new symptoms.  Your symptoms change.  Your symptoms get worse.  You have a fever.  You are constipated.  You have persistent nausea, vomiting, or diarrhea. SEEK IMMEDIATE MEDICAL CARE IF:  You become very confused or drowsy.  You vomit blood or material that looks like coffee grounds.  Your stool is bloody or black or looks like tar.   This information is not intended to replace advice given to you by your health care provider. Make sure you discuss any questions you have with your health care provider.  Document Released: 09/06/2006 Document Revised: 07/18/2014 Document Reviewed: 02/12/2014 Elsevier Interactive Patient Education [employer].

## 2015-05-06 NOTE — Progress Notes (Signed)
Subjective:    Patient ID: Adam Butler, male    DOB: 03-10-1956, 59 y.o.   MRN: [id number]  HPI  Pt presents to the clinic today for ER followup. He went to the ER 10/22 for AMS. His wife was concerned that he had hepatic encephalopathy (for which he has been hospitalized numerous times for). He does have NASH. His ammonia level was 100. All of his other labs were normal. His wife had given him an extra dose of Lactulose just prior to his ER visit. By the time to Dr evaluated him, his mental status had improved.  He was d/c'd and advised to follow up with his PCP. He is on Xifaxin in addition to his Lactulose. Since his ER visit, he reports he has been feeling well. His is having 4-5 BM's per day. They are now normal in color. He denies blood in his stool. He has not noticed any confusion.  He does follow with Dr. Hilarie Fredrickson. He has a follow up with Dr. Hilarie Fredrickson next month. He has also been called for jury duty. He request a letter be sent approving his exclusion from jury duty  Review of Systems      Past Medical History  Diagnosis Date  . Cirrhosis ([place]) 2011    Cryptogenic, Likely NASH. Family/pt deny EtOH. HCV, HBV, HAV negative. ANA negative. AMA positive. Ascites 12/11  . Hyperlipemia   . Coronary artery disease     Inferior MI 12/11; LHC with occluded mid CFX and 80% proximal RCA. EF 55%. He had 3.0 x 28 vision BMS to CFX  . Diastolic CHF, acute ([place])     Echo 12/11 with ef 50-55% and mild LVH. EF 55% by LV0gram in 12/11  . Hypertension   . Type II diabetes mellitus ([place]) 2011  . SVT (supraventricular tachycardia) ([place])     1/12: appeared to be an ectopic atrial tachycardia. Required DCCV with hemodynamic instability  . GI bleed     12/11: Etiology not clearly defined. EGD: nonbleeding esophageal varices.  . S/P coronary artery stent placement 06/2010  . Esophageal varices ([place]) 2011, 2013    no hx acute variceal bleed  . Hepatic encephalopathy ([place]) 2011, 12/2013  .  Myocardial infarction [facility] [place]) ? 2012  . Shortness of breath dyspnea   . Arthritis   . Hx of echocardiogram     Echo 5/16:  EF 55-60%, no RWMA, mod LAE  . Thrombocytopenia ([place])   . Cholelithiasis   . Rectal varices   . PFO (patent foramen ovale): Per TEE 04/20/2015 04/20/2015  . Bacteremia     Current Outpatient Prescriptions  Medication Sig Dispense Refill  . aspirin 81 MG tablet Take 81 mg by mouth daily.     Marland Kitchen atorvastatin (LIPITOR) 10 MG tablet Take 10 mg by mouth daily at 6 PM.    . furosemide (LASIX) 40 MG tablet Take 40 mg by mouth daily.     . insulin NPH Human (HUMULIN N,NOVOLIN N) 100 UNIT/ML injection Inject 0.35-0.8 mLs (35-80 Units total) into the skin See admin instructions. Take 80 units in the morning and 35 units in the evening. 10 mL 0  . lactulose (CHRONULAC) 10 GM/15ML solution Take 30 g by mouth 3 (three) times daily. May take an additional dose as needed for bowel movement    . metFORMIN (GLUCOPHAGE) 1000 MG tablet Take 1,000 mg by mouth 2 (two) times daily.  11  . Multiple Vitamin (MULTIVITAMIN) capsule Take 1 capsule by mouth daily.      Marland Kitchen  nadolol (CORGARD) 40 MG tablet Take 1 tablet (40 mg total) by mouth daily. 30 tablet 5  . nicotine (NICODERM CQ - DOSED IN MG/24 HOURS) 14 mg/24hr patch Place 1 patch (14 mg total) onto the skin daily. 28 patch 0  . nitroGLYCERIN (NITROSTAT) 0.4 MG SL tablet Place 0.4 mg under the tongue every 5 (five) minutes as needed for chest pain (chest pain).    Marland Kitchen omeprazole (PRILOSEC) 20 MG capsule Take 1 capsule (20 mg total) by mouth daily. 30 capsule 6  . ranolazine (RANEXA) 500 MG 12 hr tablet Take 2 tablets (1,000 mg total) by mouth 2 (two) times daily. 60 tablet 11  . rifaximin (XIFAXAN) 550 MG TABS tablet Take 1 tablet (550 mg total) by mouth 2 (two) times daily. 24 tablet 0  . spironolactone (ALDACTONE) 25 MG tablet Take 1 tablet (25 mg total) by mouth daily. 30 tablet 0  . sulfamethoxazole-trimethoprim (BACTRIM DS,SEPTRA DS) 800-160  MG tablet Take 1 tablet by mouth daily. Resume in 2 weeks after IV antibiotics have been completed.     No current facility-administered medications for this visit.    Allergies  Allergen Reactions  . Isosorbide Other (See Comments)    Nose bleeds  . Lactose Intolerance (Gi) Other (See Comments)    Family History  Problem Relation Age of Onset  . Heart attack Brother 44    MI  . Diabetes Brother   . Heart attack Father   . Diabetes Father   . COPD Father   . COPD Mother   . Stroke Neg Hx   . Heart attack Brother     Social History   Social History  . Marital Status: Married    Spouse Name: N/A  . Number of Children: 3  . Years of Education: N/A   Occupational History  . Unemployed Other    Worked in maintenance prior   Social History Main Topics  . Smoking status: Current Every Day Smoker -- 1.00 packs/day for 36 years    Types: E-cigarettes  . Smokeless tobacco: Current User     Comment: uses vapor cigarettes (2016 )  . Alcohol Use: No  . Drug Use: No  . Sexual Activity: No   Other Topics Concern  . Not on file   Social History Narrative   Married   Gets regular exercise: walking     Constitutional: Pt reports fatigue. Denies fever, malaise,, headache or abrupt weight changes.  Respiratory: Denies difficulty breathing, shortness of breath, cough or sputum production.   Cardiovascular: Denies chest pain, chest tightness, palpitations or swelling in the hands or feet.  Gastrointestinal: Pt reports loose stool. Denies abdominal pain, bloating, constipation, diarrhea or blood in the stool.  Neurological: Denies dizziness, difficulty with memory, difficulty with speech or problems with balance and coordination.    No other specific complaints in a complete review of systems (except as listed in HPI above).  Objective:   Physical Exam   BP 102/54 mmHg  Pulse 64  Temp(Src) 97.5 F (36.4 C) (Oral)  Wt 279 lb (126.554 kg)  SpO2 98% Wt Readings from  Last 3 Encounters:  05/06/15 279 lb (126.554 kg)  05/04/15 279 lb 9.6 oz (126.826 kg)  05/02/15 284 lb (128.822 kg)    General: Appears his stated age, chronically ill appearing in NAD. Cardiovascular: Normal rate and rhythm. S1,S2 noted.   Pulmonary/Chest: Normal effort and positive vesicular breath sounds. No respiratory distress. No wheezes, rales or ronchi noted.  Abdomen: Soft and  nontender. Normal bowel sounds. Neurological: Alert and oriented.   BMET    Component Value Date/Time   NA 139 05/02/2015 1945   K 4.0 05/02/2015 1945   CL 106 05/02/2015 1945   CO2 25 05/02/2015 1945   GLUCOSE 150* 05/02/2015 1945   BUN 9 05/02/2015 1945   CREATININE 0.70 05/02/2015 1945   CREATININE 0.74 03/15/2011 0827   CALCIUM 9.1 05/02/2015 1945   GFRNONAA >60 05/02/2015 1945   GFRAA >60 05/02/2015 1945    Lipid Panel     Component Value Date/Time   CHOL 160 02/16/2015 0902   TRIG 95.0 02/16/2015 0902   HDL 47.00 02/16/2015 0902   CHOLHDL 3 02/16/2015 0902   VLDL 19.0 02/16/2015 0902   LDLCALC 94 02/16/2015 0902    CBC    Component Value Date/Time   WBC 4.6 05/02/2015 1945   RBC 3.54* 05/02/2015 1945   HGB 12.1* 05/02/2015 1945   HCT 35.7* 05/02/2015 1945   PLT 66* 05/02/2015 1945   MCV 100.8* 05/02/2015 1945   MCH 34.2* 05/02/2015 1945   MCHC 33.9 05/02/2015 1945   RDW 13.7 05/02/2015 1945   LYMPHSABS 1.3 05/02/2015 1945   MONOABS 0.5 05/02/2015 1945   EOSABS 0.2 05/02/2015 1945   BASOSABS 0.0 05/02/2015 1945    Hgb A1C Lab Results  Component Value Date   HGBA1C 8.2* 04/14/2015        Assessment & Plan:   ER followup for hepatic encephalopathy:  ER notes and labs reviewed He appears to be back to baseline We will continue Xifaxin and Lactulose- he will adjust Lactulose so that he is having 4-5 stools per day He will follow up with Dr. Hilarie Fredrickson as scheduled  Jury duty letter will be written, we will call you when it is ready  RTC as needed or if symptoms  persist or worsen

## 2015-05-06 NOTE — Telephone Encounter (Signed)
Talked to Lyda Jester and she said she did not have him down for an appt. I will let her know.

## 2015-05-06 NOTE — Progress Notes (Signed)
Pre visit review using our clinic review tool, if applicable. No additional management support is needed unless otherwise documented below in the visit note. 

## 2015-05-07 ENCOUNTER — Emergency Department (HOSPITAL_COMMUNITY): Payer: PPO

## 2015-05-07 ENCOUNTER — Encounter (HOSPITAL_COMMUNITY): Payer: Self-pay | Admitting: *Deleted

## 2015-05-07 ENCOUNTER — Inpatient Hospital Stay: Payer: PPO | Admitting: Internal Medicine

## 2015-05-07 ENCOUNTER — Inpatient Hospital Stay (HOSPITAL_COMMUNITY)
Admission: EM | Admit: 2015-05-07 | Discharge: 2015-05-08 | DRG: 442 | Disposition: A | Discharge status: Home / Self Care | Payer: PPO | Attending: Family Medicine | Admitting: Family Medicine

## 2015-05-07 DIAGNOSIS — Z79899 Other long term (current) drug therapy: Secondary | ICD-10-CM

## 2015-05-07 DIAGNOSIS — K7682 Hepatic encephalopathy: Secondary | ICD-10-CM | POA: Diagnosis present

## 2015-05-07 DIAGNOSIS — Z7984 Long term (current) use of oral hypoglycemic drugs: Secondary | ICD-10-CM | POA: Diagnosis not present

## 2015-05-07 DIAGNOSIS — Z794 Long term (current) use of insulin: Secondary | ICD-10-CM | POA: Diagnosis not present

## 2015-05-07 DIAGNOSIS — Q211 Atrial septal defect: Secondary | ICD-10-CM | POA: Diagnosis not present

## 2015-05-07 DIAGNOSIS — R338 Other retention of urine: Secondary | ICD-10-CM | POA: Diagnosis not present

## 2015-05-07 DIAGNOSIS — R7881 Bacteremia: Secondary | ICD-10-CM | POA: Diagnosis present

## 2015-05-07 DIAGNOSIS — F17200 Nicotine dependence, unspecified, uncomplicated: Secondary | ICD-10-CM | POA: Diagnosis present

## 2015-05-07 DIAGNOSIS — I5032 Chronic diastolic (congestive) heart failure: Secondary | ICD-10-CM

## 2015-05-07 DIAGNOSIS — I252 Old myocardial infarction: Secondary | ICD-10-CM

## 2015-05-07 DIAGNOSIS — I2510 Atherosclerotic heart disease of native coronary artery without angina pectoris: Secondary | ICD-10-CM | POA: Diagnosis present

## 2015-05-07 DIAGNOSIS — K7469 Other cirrhosis of liver: Secondary | ICD-10-CM

## 2015-05-07 DIAGNOSIS — R339 Retention of urine, unspecified: Secondary | ICD-10-CM | POA: Diagnosis present

## 2015-05-07 DIAGNOSIS — M1990 Unspecified osteoarthritis, unspecified site: Secondary | ICD-10-CM | POA: Diagnosis present

## 2015-05-07 DIAGNOSIS — I110 Hypertensive heart disease with heart failure: Secondary | ICD-10-CM | POA: Diagnosis present

## 2015-05-07 DIAGNOSIS — Z959 Presence of cardiac and vascular implant and graft, unspecified: Secondary | ICD-10-CM | POA: Diagnosis not present

## 2015-05-07 DIAGNOSIS — B954 Other streptococcus as the cause of diseases classified elsewhere: Secondary | ICD-10-CM

## 2015-05-07 DIAGNOSIS — E785 Hyperlipidemia, unspecified: Secondary | ICD-10-CM | POA: Diagnosis present

## 2015-05-07 DIAGNOSIS — I8500 Esophageal varices without bleeding: Secondary | ICD-10-CM

## 2015-05-07 DIAGNOSIS — Z7982 Long term (current) use of aspirin: Secondary | ICD-10-CM | POA: Diagnosis not present

## 2015-05-07 DIAGNOSIS — K7290 Hepatic failure, unspecified without coma: Secondary | ICD-10-CM | POA: Diagnosis present

## 2015-05-07 DIAGNOSIS — K8020 Calculus of gallbladder without cholecystitis without obstruction: Secondary | ICD-10-CM | POA: Diagnosis present

## 2015-05-07 DIAGNOSIS — IMO0002 Reserved for concepts with insufficient information to code with codable children: Secondary | ICD-10-CM | POA: Diagnosis present

## 2015-05-07 DIAGNOSIS — Q2112 Patent foramen ovale: Secondary | ICD-10-CM

## 2015-05-07 DIAGNOSIS — R4182 Altered mental status, unspecified: Secondary | ICD-10-CM | POA: Diagnosis present

## 2015-05-07 DIAGNOSIS — K7200 Acute and subacute hepatic failure without coma: Secondary | ICD-10-CM | POA: Diagnosis present

## 2015-05-07 DIAGNOSIS — Z9889 Other specified postprocedural states: Secondary | ICD-10-CM | POA: Diagnosis not present

## 2015-05-07 DIAGNOSIS — E1165 Type 2 diabetes mellitus with hyperglycemia: Secondary | ICD-10-CM

## 2015-05-07 LAB — CBC WITH DIFFERENTIAL/PLATELET
BASOS ABS: 0 10*3/uL (ref 0.0–0.1)
BASOS PCT: 1 %
EOS ABS: 0.2 10*3/uL (ref 0.0–0.7)
EOS PCT: 6 %
HCT: 39.9 % (ref 39.0–52.0)
Hemoglobin: 13.8 g/dL (ref 13.0–17.0)
LYMPHS PCT: 24 %
Lymphs Abs: 0.9 10*3/uL (ref 0.7–4.0)
MCH: 34.6 pg — ABNORMAL HIGH (ref 26.0–34.0)
MCHC: 34.6 g/dL (ref 30.0–36.0)
MCV: 100 fL (ref 78.0–100.0)
Monocytes Absolute: 0.5 10*3/uL (ref 0.1–1.0)
Monocytes Relative: 13 %
Neutro Abs: 2.1 10*3/uL (ref 1.7–7.7)
Neutrophils Relative %: 57 %
PLATELETS: 52 10*3/uL — AB (ref 150–400)
RBC: 3.99 MIL/uL — AB (ref 4.22–5.81)
RDW: 13.7 % (ref 11.5–15.5)
WBC: 3.6 10*3/uL — AB (ref 4.0–10.5)

## 2015-05-07 LAB — RAPID URINE DRUG SCREEN, HOSP PERFORMED
AMPHETAMINES: NOT DETECTED
BENZODIAZEPINES: NOT DETECTED
Barbiturates: NOT DETECTED
Cocaine: NOT DETECTED
OPIATES: NOT DETECTED
TETRAHYDROCANNABINOL: NOT DETECTED

## 2015-05-07 LAB — CBG MONITORING, ED: GLUCOSE-CAPILLARY: 133 mg/dL — AB (ref 65–99)

## 2015-05-07 LAB — PROTIME-INR
INR: 1.34 (ref 0.00–1.49)
PROTHROMBIN TIME: 16.7 s — AB (ref 11.6–15.2)

## 2015-05-07 LAB — URINALYSIS, ROUTINE W REFLEX MICROSCOPIC
Bilirubin Urine: NEGATIVE
GLUCOSE, UA: NEGATIVE mg/dL
Hgb urine dipstick: NEGATIVE
Ketones, ur: NEGATIVE mg/dL
LEUKOCYTES UA: NEGATIVE
NITRITE: NEGATIVE
PROTEIN: NEGATIVE mg/dL
SPECIFIC GRAVITY, URINE: 1.012 (ref 1.005–1.030)
UROBILINOGEN UA: 0.2 mg/dL (ref 0.0–1.0)
pH: 7 (ref 5.0–8.0)

## 2015-05-07 LAB — COMPREHENSIVE METABOLIC PANEL
ALT: 40 U/L (ref 17–63)
AST: 77 U/L — AB (ref 15–41)
Albumin: 2.9 g/dL — ABNORMAL LOW (ref 3.5–5.0)
Alkaline Phosphatase: 139 U/L — ABNORMAL HIGH (ref 38–126)
Anion gap: 9 (ref 5–15)
BILIRUBIN TOTAL: 1.4 mg/dL — AB (ref 0.3–1.2)
BUN: 7 mg/dL (ref 6–20)
CALCIUM: 9.3 mg/dL (ref 8.9–10.3)
CO2: 25 mmol/L (ref 22–32)
Chloride: 105 mmol/L (ref 101–111)
Creatinine, Ser: 0.84 mg/dL (ref 0.61–1.24)
GFR calc Af Amer: >60 mL/min (ref >60)
Glucose, Bld: 152 mg/dL — ABNORMAL HIGH (ref 65–99)
POTASSIUM: 4.3 mmol/L (ref 3.5–5.1)
Sodium: 139 mmol/L (ref 135–145)
TOTAL PROTEIN: 6.3 g/dL — AB (ref 6.5–8.1)

## 2015-05-07 LAB — CULTURE, BLOOD (ROUTINE X 2)
Culture: NO GROWTH
Culture: NO GROWTH

## 2015-05-07 LAB — LIPASE, BLOOD: LIPASE: 57 U/L — AB (ref 11–51)

## 2015-05-07 LAB — AMMONIA: Ammonia: 229 umol/L — ABNORMAL HIGH (ref 9–35)

## 2015-05-07 LAB — GLUCOSE, CAPILLARY
GLUCOSE-CAPILLARY: 129 mg/dL — AB (ref 65–99)
GLUCOSE-CAPILLARY: 94 mg/dL (ref 65–99)

## 2015-05-07 LAB — MRSA PCR SCREENING: MRSA by PCR: NEGATIVE

## 2015-05-07 LAB — POC OCCULT BLOOD, ED: Fecal Occult Bld: NEGATIVE

## 2015-05-07 MED ORDER — INSULIN ASPART 100 UNIT/ML ~~LOC~~ SOLN
0.0000 [IU] | SUBCUTANEOUS | Status: DC
  Administered 2015-05-08: 3 [IU] via SUBCUTANEOUS
  Administered 2015-05-08: 2 [IU] via SUBCUTANEOUS

## 2015-05-07 MED ORDER — THIAMINE HCL 100 MG/ML IJ SOLN
200.0000 mg | Freq: Once | INTRAMUSCULAR | Status: AC
  Administered 2015-05-07: 200 mg via INTRAVENOUS
  Filled 2015-05-07: qty 2

## 2015-05-07 MED ORDER — SODIUM CHLORIDE 0.9 % IJ SOLN
3.0000 mL | Freq: Two times a day (BID) | INTRAMUSCULAR | Status: DC
  Administered 2015-05-07 – 2015-05-08 (×2): 3 mL via INTRAVENOUS

## 2015-05-07 MED ORDER — LACTULOSE 10 GM/15ML PO SOLN
30.0000 g | Freq: Once | ORAL | Status: AC
  Administered 2015-05-07: 30 g via ORAL
  Filled 2015-05-07: qty 45

## 2015-05-07 MED ORDER — SODIUM CHLORIDE 0.9 % IV SOLN
INTRAVENOUS | Status: AC
  Administered 2015-05-07: 15:00:00 via INTRAVENOUS

## 2015-05-07 MED ORDER — LACTULOSE 10 GM/15ML PO SOLN
30.0000 g | Freq: Once | ORAL | Status: DC
  Filled 2015-05-07: qty 45

## 2015-05-07 MED ORDER — SULFAMETHOXAZOLE-TRIMETHOPRIM 400-80 MG/5ML IV SOLN: 160.0000 mg | Freq: Three times a day (TID) | INTRAVENOUS | Status: DC

## 2015-05-07 MED ORDER — LACTULOSE ENEMA
300.0000 mL | Freq: Two times a day (BID) | ORAL | Status: DC
  Administered 2015-05-07: 300 mL via RECTAL
  Filled 2015-05-07 (×5): qty 300

## 2015-05-07 NOTE — ED Notes (Signed)
Lab will add-on lipase

## 2015-05-07 NOTE — Progress Notes (Signed)
[facility]  Patient Status:   Active pt with [facility] prior to this readmission  [facility] is providing the following services: HHRN and [facility] for home IV ABX. [facility] hospital team will follow pt while here to support transition home when ordered.  If patient discharges after hours, please call [phone].   Adam Butler 05/07/2015, 5:55 PM

## 2015-05-07 NOTE — ED Provider Notes (Addendum)
CSN: [id number]     Arrival date & time 05/07/15  0912 History   First MD Initiated Contact with Patient 05/07/15 0913     Chief Complaint  Patient presents with  . Altered Mental Status     (Consider location/radiation/quality/duration/timing/severity/associated sxs/prior Treatment) HPI Level V caveat altered mental status history is obtained from patient's wife Charlcie Cradle via telephone. Her cell number is [phone] Patient was well until possibly 7 PM yesterday when his wife noticed that he was acting abnormally at the supermarket. He did not recognize fruit. He went to bed last night and at 8 AM today family members had difficulty arousing him. He was also noted to be intermittently combative. EMS was called perform CBG was 160. Wife states he is had similar instances in the past when "his ammonia was elevated" no treatment prior to coming here Past Medical History  Diagnosis Date  . Cirrhosis ([place]) 2011    Cryptogenic, Likely NASH. Family/pt deny EtOH. HCV, HBV, HAV negative. ANA negative. AMA positive. Ascites 12/11  . Hyperlipemia   . Coronary artery disease     Inferior MI 12/11; LHC with occluded mid CFX and 80% proximal RCA. EF 55%. He had 3.0 x 28 vision BMS to CFX  . Diastolic CHF, acute ([place])     Echo 12/11 with ef 50-55% and mild LVH. EF 55% by LV0gram in 12/11  . Hypertension   . Type II diabetes mellitus ([place]) 2011  . SVT (supraventricular tachycardia) ([place])     1/12: appeared to be an ectopic atrial tachycardia. Required DCCV with hemodynamic instability  . GI bleed     12/11: Etiology not clearly defined. EGD: nonbleeding esophageal varices.  . S/P coronary artery stent placement 06/2010  . Esophageal varices ([place]) 2011, 2013    no hx acute variceal bleed  . Hepatic encephalopathy ([place]) 2011, 12/2013  . Myocardial infarction [facility]) ? 2012  . Shortness of breath dyspnea   . Arthritis   . Hx of echocardiogram     Echo 5/16:  EF 55-60%, no RWMA, mod LAE  .  Thrombocytopenia ([place])   . Cholelithiasis   . Rectal varices   . PFO (patent foramen ovale): Per TEE 04/20/2015 04/20/2015  . Bacteremia    Past Surgical History  Procedure Laterality Date  . Cardiac catheterization  07/01/2010    BMS to CFX.  Marland Kitchen Appendectomy    . Orif r leg    . Esophagogastroduodenoscopy  04/13/2012    Procedure: ESOPHAGOGASTRODUODENOSCOPY (EGD);  Surgeon: Inda Castle, MD;  Location: [facility] ENDOSCOPY;  Service: Endoscopy;  Laterality: N/A;  . Colonoscopy  04/13/2012    Procedure: COLONOSCOPY;  Surgeon: Inda Castle, MD;  Location: [facility] ENDOSCOPY;  Service: Endoscopy;  Laterality: N/A;  . Esophagogastroduodenoscopy N/A 01/01/2014    Procedure: ESOPHAGOGASTRODUODENOSCOPY (EGD);  Surgeon: Jerene Bears, MD;  Location: [facility] ENDOSCOPY;  Service: Endoscopy;  Laterality: N/A;  . Esophageal banding N/A 01/01/2014    Procedure: ESOPHAGEAL BANDING;  Surgeon: Jerene Bears, MD;  Location: [facility] ENDOSCOPY;  Service: Endoscopy;  Laterality: N/A;  . Coronary stent placement  06/30/2010    CFX   Distal        . Left heart catheterization with coronary angiogram N/A 10/15/2014    Procedure: LEFT HEART CATHETERIZATION WITH CORONARY ANGIOGRAM;  Surgeon: Peter M Martinique, MD;  Location: [facility] CATH LAB;  Service: Cardiovascular;  Laterality: N/A;  . Tee without cardioversion N/A 04/20/2015    Procedure: TRANSESOPHAGEAL ECHOCARDIOGRAM (TEE);  Surgeon: Dorris Carnes  V, MD;  Location: [facility] ENDOSCOPY;  Service: Cardiovascular;  Laterality: N/A;   Family History  Problem Relation Age of Onset  . Heart attack Brother 24    MI  . Diabetes Brother   . Heart attack Father   . Diabetes Father   . COPD Father   . COPD Mother   . Stroke Neg Hx   . Heart attack Brother    Social History  Substance Use Topics  . Smoking status: Current Every Day Smoker -- 1.00 packs/day for 36 years    Types: E-cigarettes  . Smokeless tobacco: Current User     Comment: uses vapor cigarettes (2016 )  . Alcohol Use: No     Review of Systems  Unable to perform ROS: Mental status change      Allergies  Isosorbide and Lactose intolerance (gi)  Home Medications   Prior to Admission medications   Medication Sig Start Date End Date Taking? Authorizing Provider  aspirin 81 MG tablet Take 81 mg by mouth daily.     Historical Provider, MD  atorvastatin (LIPITOR) 10 MG tablet Take 10 mg by mouth daily at 6 PM.    Historical Provider, MD  furosemide (LASIX) 40 MG tablet Take 40 mg by mouth daily.  01/09/12   Larey Dresser, MD  insulin NPH Human (HUMULIN N,NOVOLIN N) 100 UNIT/ML injection Inject 0.35-0.8 mLs (35-80 Units total) into the skin See admin instructions. Take 80 units in the morning and 35 units in the evening. 04/20/15   Eugenie Filler, MD  lactulose (CHRONULAC) 10 GM/15ML solution Take 30 g by mouth 3 (three) times daily. May take an additional dose as needed for bowel movement    Historical Provider, MD  metFORMIN (GLUCOPHAGE) 1000 MG tablet Take 1,000 mg by mouth 2 (two) times daily. 11/27/14   Historical Provider, MD  Multiple Vitamin (MULTIVITAMIN) capsule Take 1 capsule by mouth daily.      Historical Provider, MD  nadolol (CORGARD) 40 MG tablet Take 1 tablet (40 mg total) by mouth daily. 01/01/15   Jerene Bears, MD  nitroGLYCERIN (NITROSTAT) 0.4 MG SL tablet Place 0.4 mg under the tongue every 5 (five) minutes as needed for chest pain (chest pain).    Historical Provider, MD  omeprazole (PRILOSEC) 20 MG capsule Take 1 capsule (20 mg total) by mouth daily. 01/15/15   Larey Dresser, MD  ranolazine (RANEXA) 500 MG 12 hr tablet Take 2 tablets (1,000 mg total) by mouth 2 (two) times daily. 01/15/15   Larey Dresser, MD  rifaximin (XIFAXAN) 550 MG TABS tablet Take 1 tablet (550 mg total) by mouth 2 (two) times daily. 02/13/15   Jerene Bears, MD  spironolactone (ALDACTONE) 25 MG tablet Take 1 tablet (25 mg total) by mouth daily. 04/20/15   Eugenie Filler, MD  sulfamethoxazole-trimethoprim (BACTRIM  DS,SEPTRA DS) 800-160 MG tablet Take 1 tablet by mouth daily. Resume in 2 weeks after IV antibiotics have been completed. 05/04/15   Eugenie Filler, MD   BP 161/66 mmHg  Pulse 64  Temp(Src) 98.3 F (36.8 C) (Rectal)  Resp 16  SpO2 96% Physical Exam  Constitutional:  Acutely and chronically Ill-appearing. Does not answer questions. Opens eyes to noxious stimulus. Purposeful movement to noxious stimulus. Yells inappropriately. Glasgow Coma Score 13  HENT:  Head: Normocephalic and atraumatic.  Eyes: Conjunctivae are normal. Pupils are equal, round, and reactive to light.  Pupils 3 mm reactive to light bilaterally  Neck: Neck supple. No  tracheal deviation present. No thyromegaly present.  Cardiovascular: Normal rate and regular rhythm.   No murmur heard. Pulmonary/Chest: Effort normal and breath sounds normal.  Abdominal: Soft. Bowel sounds are normal. He exhibits no distension. There is no tenderness.  Ecchymotic anteriorly, obese  Genitourinary: Rectum normal and penis normal.  Brown stool no gross blood  Musculoskeletal: Normal range of motion. He exhibits no edema or tenderness.  Neurological: He is alert. Coordination normal.  Moves all extremities motor strength 5 over 5 overall does not follow simple commands. DTRs symmetric bilaterally knee jerk and ankle jerk and biceps toes downgoing bilaterally. Gag reflex intact  Skin: Skin is warm and dry. No rash noted.  Psychiatric: He has a normal mood and affect.  Nursing note and vitals reviewed.   ED Course  Procedures (including critical care time) Labs Review Labs Reviewed  COMPREHENSIVE METABOLIC PANEL  CBC WITH DIFFERENTIAL/PLATELET  URINALYSIS, ROUTINE W REFLEX MICROSCOPIC (NOT AT [facility])  URINE RAPID DRUG SCREEN, HOSP PERFORMED  PROTIME-INR  AMMONIA  POC OCCULT BLOOD, ED    Imaging Review No results found. I have personally reviewed and evaluated these images and lab results as part of my medical  decision-making.   EKG Interpretation   Date/Time:  Thursday May 07 2015 09:34:07 EDT Ventricular Rate:  68 PR Interval:  168 QRS Duration: 104 QT Interval:  537 QTC Calculation: 571 R Axis:   91 Text Interpretation:  Sinus rhythm Inferolateral infarct, old Prolonged QT  interval No significant change since last tracing Confirmed by Deliah Strehlow   MD, Clydell Alberts [phone]) on 05/07/2015 10:09:09 AM     12:20 PM patient is arousable to tactile stimulus with purposeful movement. Exam essentially unchanged Results for orders placed or performed during the hospital encounter of 05/07/15  Comprehensive metabolic panel  Result Value Ref Range   Sodium 139 135 - 145 mmol/L   Potassium 4.3 3.5 - 5.1 mmol/L   Chloride 105 101 - 111 mmol/L   CO2 25 22 - 32 mmol/L   Glucose, Bld 152 (H) 65 - 99 mg/dL   BUN 7 6 - 20 mg/dL   Creatinine, Ser 0.84 0.61 - 1.24 mg/dL   Calcium 9.3 8.9 - 10.3 mg/dL   Total Protein 6.3 (L) 6.5 - 8.1 g/dL   Albumin 2.9 (L) 3.5 - 5.0 g/dL   AST 77 (H) 15 - 41 U/L   ALT 40 17 - 63 U/L   Alkaline Phosphatase 139 (H) 38 - 126 U/L   Total Bilirubin 1.4 (H) 0.3 - 1.2 mg/dL   GFR calc non Af Amer >60 >60 mL/min   GFR calc Af Amer >60 >60 mL/min   Anion gap 9 5 - 15  CBC with Differential/Platelet  Result Value Ref Range   WBC 3.6 (L) 4.0 - 10.5 K/uL   RBC 3.99 (L) 4.22 - 5.81 MIL/uL   Hemoglobin 13.8 13.0 - 17.0 g/dL   HCT 39.9 39.0 - 52.0 %   MCV 100.0 78.0 - 100.0 fL   MCH 34.6 (H) 26.0 - 34.0 pg   MCHC 34.6 30.0 - 36.0 g/dL   RDW 13.7 11.5 - 15.5 %   Platelets 52 (L) 150 - 400 K/uL   Neutrophils Relative % 57 %   Neutro Abs 2.1 1.7 - 7.7 K/uL   Lymphocytes Relative 24 %   Lymphs Abs 0.9 0.7 - 4.0 K/uL   Monocytes Relative 13 %   Monocytes Absolute 0.5 0.1 - 1.0 K/uL   Eosinophils Relative 6 %  Eosinophils Absolute 0.2 0.0 - 0.7 K/uL   Basophils Relative 1 %   Basophils Absolute 0.0 0.0 - 0.1 K/uL  Urinalysis, Routine w reflex microscopic (not at [facility])   Result Value Ref Range   Color, Urine YELLOW YELLOW   APPearance CLEAR CLEAR   Specific Gravity, Urine 1.012 1.005 - 1.030   pH 7.0 5.0 - 8.0   Glucose, UA NEGATIVE NEGATIVE mg/dL   Hgb urine dipstick NEGATIVE NEGATIVE   Bilirubin Urine NEGATIVE NEGATIVE   Ketones, ur NEGATIVE NEGATIVE mg/dL   Protein, ur NEGATIVE NEGATIVE mg/dL   Urobilinogen, UA 0.2 0.0 - 1.0 mg/dL   Nitrite NEGATIVE NEGATIVE   Leukocytes, UA NEGATIVE NEGATIVE  Urine rapid drug screen (hosp performed)  Result Value Ref Range   Opiates NONE DETECTED NONE DETECTED   Cocaine NONE DETECTED NONE DETECTED   Benzodiazepines NONE DETECTED NONE DETECTED   Amphetamines NONE DETECTED NONE DETECTED   Tetrahydrocannabinol NONE DETECTED NONE DETECTED   Barbiturates NONE DETECTED NONE DETECTED  Protime-INR  Result Value Ref Range   Prothrombin Time 16.7 (H) 11.6 - 15.2 seconds   INR 1.34 0.00 - 1.49  Ammonia  Result Value Ref Range   Ammonia 229 (H) 9 - 35 umol/L  POC occult blood, ED Provider will collect  Result Value Ref Range   Fecal Occult Bld NEGATIVE NEGATIVE  CBG monitoring, ED  Result Value Ref Range   Glucose-Capillary 133 (H) 65 - 99 mg/dL   Dg Orthopantogram  04/16/2015  CLINICAL DATA:  Poor dentition.  Bacteremia. EXAM: ORTHOPANTOGRAM/PANORAMIC COMPARISON:  None. FINDINGS: Patient is missing multiple teeth including the upper central incisors. There is concern for large caries involving the right upper lateral incisor. There may also be large caries involving the left upper bicuspid. The lower central incisors may be broken but the roots appear to be present. Concern for caries involving the left lower lateral incisor. The molars have been removed. There may be a small molar remnant on the upper left. Mandible is intact. There may be mild periapical lucency involving the remnants of the lower central incisors. IMPRESSION: Concern for multiple caries. Subtle periapical lucency involving the lower central  incisors. Electronically Signed   By: Markus Daft M.D.   On: 04/16/2015 17:21   Dg Chest 2 View  05/02/2015  CLINICAL DATA:  Altered mental status. History of healthcare acquired pneumonia. EXAM: CHEST  2 VIEW COMPARISON:  04/14/2015 FINDINGS: Tip of the right upper extremity PICC in the mid proximal SVC cardiomediastinal contours are unchanged, heart at the upper limits of normal in size. Improving pulmonary edema with persistent vascular congestion. No confluent airspace disease, pleural effusion or pneumothorax. Degenerative change in the thoracic spine. IMPRESSION: 1. No acute process. 2. Tip of the right upper extremity PICC in the SVC. Electronically Signed   By: Jeb Levering M.D.   On: 05/02/2015 20:08   Ct Head Wo Contrast  05/07/2015  CLINICAL DATA:  Altered mental status. Found unresponsive this morning. History of nonalcoholic cirrhosis and hepatic encephalopathy. EXAM: CT HEAD WITHOUT CONTRAST TECHNIQUE: Contiguous axial images were obtained from the base of the skull through the vertex without intravenous contrast. COMPARISON:  04/14/2015 FINDINGS: There is no evidence of acute cortical infarct, intracranial hemorrhage, mass, midline shift, or extra-axial fluid collection. Ventricles and sulci are normal. Patchy hypodensities in the subcortical deep cerebral white matter bilaterally are similar to the prior study and nonspecific but compatible with mild chronic small vessel ischemic disease. Small, chronic right occipital lobe cortical  infarct is unchanged. Prior right cataract extraction is noted. Paranasal sinuses and mastoid air cells are clear. Right maxillary sinus is small. Calcified atherosclerosis is noted at the skullbase. No skull fracture is identified. IMPRESSION: 1. No evidence of acute intracranial abnormality. 2. Chronic ischemic change. Electronically Signed   By: Logan Bores M.D.   On: 05/07/2015 12:04   Ct Head Wo Contrast  04/14/2015  CLINICAL DATA:  Acute onset of  altered mental status. Fruity odor to breath. Personal history of elevated ammonia. Initial encounter. EXAM: CT HEAD WITHOUT CONTRAST TECHNIQUE: Contiguous axial images were obtained from the base of the skull through the vertex without intravenous contrast. COMPARISON:  CT of the head performed 01/19/2015 FINDINGS: There is no evidence of acute infarction, mass lesion, or intra- or extra-axial hemorrhage on CT. Scattered periventricular and subcortical white matter change likely reflects small vessel ischemic microangiopathy. A small chronic infarct is noted at the right occipital lobe. The posterior fossa, including the cerebellum, brainstem and fourth ventricle, is within normal limits. The third and lateral ventricles, and basal ganglia are unremarkable in appearance. No mass effect or midline shift is seen. There is no evidence of fracture; visualized osseous structures are unremarkable in appearance. The orbits are within normal limits. The paranasal sinuses and mastoid air cells are well-aerated. No significant soft tissue abnormalities are seen. IMPRESSION: 1. No acute intracranial pathology seen on CT. 2. Scattered small vessel ischemic microangiopathy. Small chronic infarct at the right occipital lobe. Electronically Signed   By: Garald Balding M.D.   On: 04/14/2015 04:09   US Abdomen Complete  04/18/2015  CLINICAL DATA:  Acute onset of bacteremia. Ascites. Initial encounter. EXAM: ULTRASOUND ABDOMEN COMPLETE COMPARISON:  CT of the abdomen and pelvis performed 04/14/2015 FINDINGS: Gallbladder: A few stones are seen within the gallbladder. The gallbladder is otherwise unremarkable. No significant gallbladder wall thickening or pericholecystic fluid is seen. No ultrasonographic Murphy's sign is elicited. Common bile duct: Diameter: 0.7 cm, borderline prominent. Liver: No focal lesion identified. Within normal limits in parenchymal echogenicity. Difficult to fully characterize due to the patient's habitus.  IVC: No abnormality visualized. Pancreas: Not visualized. Spleen: Size and appearance within normal limits. Right Kidney: Length: 12.2 cm. Echogenicity within normal limits. No mass or hydronephrosis visualized. Left Kidney: Length: 11.6 cm. Echogenicity within normal limits. No mass or hydronephrosis visualized. Abdominal aorta: No aneurysm visualized. Other findings: None. IMPRESSION: 1. No acute abnormality seen within the abdomen. Evaluation suboptimal due to the patient's habitus. 2. Cholelithiasis. Gallbladder otherwise unremarkable. Slight prominence of the common bile duct likely remains within normal limits. Electronically Signed   By: Garald Balding M.D.   On: 04/18/2015 00:44   Dg Chest Port 1 View  04/14/2015  CLINICAL DATA:  Acute onset of shortness of breath. Initial encounter. EXAM: PORTABLE CHEST 1 VIEW COMPARISON:  Chest radiograph performed 01/31/2015 FINDINGS: The lungs are hypoexpanded. Vascular congestion is noted, with increased interstitial markings, concerning for mild pulmonary edema. There is no evidence of pleural effusion or pneumothorax. The cardiomediastinal silhouette is borderline normal in size. No acute osseous abnormalities are seen. IMPRESSION: Lungs hypoexpanded. Vascular congestion, with increased interstitial markings, concerning for mild pulmonary edema. Electronically Signed   By: Garald Balding M.D.   On: 04/14/2015 01:26   Ct Renal Stone Study  04/14/2015  CLINICAL DATA:  Altered mental status, first noticed around 20:30. EXAM: CT ABDOMEN AND PELVIS WITHOUT CONTRAST TECHNIQUE: Multidetector CT imaging of the abdomen and pelvis was performed following the standard protocol without  IV contrast. COMPARISON:  12/29/2013 FINDINGS: No acute finding is evident in the lower chest. The liver is cirrhotic. No focal liver lesion is evident on this unenhanced scan. There is no bile duct dilatation. There are numerous calculi within the gallbladder lumen. There are numerous very  large varices, including very prominent varices around the distal esophagus and stomach. There is recanalization of the umbilical vein with caput medusa. There are numerous varices elsewhere in the abdomen and pelvis. The portal vein is enlarged and partially calcified. The calcification corresponds to the area of partial thrombosis observed on 12/29/2013. Patency of the portal vein cannot be established on this study. There is mild splenomegaly, probably slightly worsened. There is a very small volume of ascites collected dependently in the pelvis. There is a 2 x 3 mm lower pole left collecting system calculus. There is no hydronephrosis or ureteral dilatation. There is no bowel obstruction. There is no extraluminal air. There is hazy high attenuation in the mesentery which is worsened from 12/29/2013. This mesenteric edema could be related to portal hypertension. No significant skeletal lesion is evident. IMPRESSION: 1. Cirrhosis with portal hypertension. Very large varices. Patency of the portal vein is indeterminate. 2. Very small volume of ascites collected dependently in the pelvis, new. Worsened mesenteric edema. 3. Mild splenomegaly, probably mildly worsened. 4. Left nephrolithiasis. 5. No evidence of bowel obstruction or perforation. No focal inflammatory changes are evident. 6. Cholelithiasis Electronically Signed   By: Andreas Newport M.D.   On: 04/14/2015 04:19    MDM  I spoke with hospitalist service and he will be admitted to stepdown unit. Lactulose will be ordered rectally. I'm concern for aspiration. He is currently taking lactulose 3 times daily Final diagnoses:  None  Dx #1 hepatic encephalopathy #2 hyperglycermia CRITICAL CARE Performed by: Orlie Dakin Total critical care time: 30 minutes Critical care time was exclusive of separately billable procedures and treating other patients. Critical care was necessary to treat or prevent imminent or life-threatening  deterioration. Critical care was time spent personally by me on the following activities: development of treatment plan with patient and/or surrogate as well as nursing, discussions with consultants, evaluation of patient's response to treatment, examination of patient, obtaining history from patient or surrogate, ordering and performing treatments and interventions, ordering and review of laboratory studies, ordering and review of radiographic studies, pulse oximetry and re-evaluation of patient's condition.    Orlie Dakin, MD 05/07/15 [place], MD 05/07/15 [phone]

## 2015-05-07 NOTE — Care Management Note (Signed)
Case Management Note  Patient Details  Name: Adam Butler MRN: [id number] Date of Birth: June 24, 1956  Subjective/Objective:            Admitted with acute hepatic encephalopathy , history of cryptogenic/NASH cirrhosis esophageal varices and recurrent hepatic encephalopathy. Recently discharged on 04/20/15  for sepsis related to healthcare acquired pneumonia and streptococcal bacteremia.    Action/Plan: Return to home when medically stable.CM to f/u with d/c needs.  Expected Discharge Date:                  Expected Discharge Plan:  [place]  In-House Referral:     Discharge planning Services  CM Consult  Post Acute Care Choice:  Resumption of Svcs/PTA Provider Choice offered to:     DME Arranged:    DME Agency:     HH Arranged:  RN [place] Agency:  [facility]  Status of Service:  In process, will continue to follow  Medicare Important Message Given:    Date Medicare IM Given:    Medicare IM give by:    Date Additional Medicare IM Given:    Additional Medicare Important Message give by:     If discussed at [place] of Stay Meetings, dates discussed:    Additional Comments: Teigan Sahli (Spouse)  [phone]  [address] [place], [place] [phone] 05/07/2015, 8:11 PM

## 2015-05-07 NOTE — H&P (Signed)
[facility] History and Physical                                                                                    Adam Butler, is a 59 y.o. male  MRN: [id number]   DOB - 1955/08/08  Admit Date - 05/07/2015  Outpatient Primary MD for the patient is [name], NP  Referring MD: Winfred Leeds / ER  With History of -  Past Medical History  Diagnosis Date  . Cirrhosis ([place]) 2011    Cryptogenic, Likely NASH. Family/pt deny EtOH. HCV, HBV, HAV negative. ANA negative. AMA positive. Ascites 12/11  . Hyperlipemia   . Coronary artery disease     Inferior MI 12/11; LHC with occluded mid CFX and 80% proximal RCA. EF 55%. He had 3.0 x 28 vision BMS to CFX  . Diastolic CHF, acute ([place])     Echo 12/11 with ef 50-55% and mild LVH. EF 55% by LV0gram in 12/11  . Hypertension   . Type II diabetes mellitus ([place]) 2011  . SVT (supraventricular tachycardia) ([place])     1/12: appeared to be an ectopic atrial tachycardia. Required DCCV with hemodynamic instability  . GI bleed     12/11: Etiology not clearly defined. EGD: nonbleeding esophageal varices.  . S/P coronary artery stent placement 06/2010  . Esophageal varices ([place]) 2011, 2013    no hx acute variceal bleed  . Hepatic encephalopathy ([place]) 2011, 12/2013  . Myocardial infarction [facility]) ? 2012  . Shortness of breath dyspnea   . Arthritis   . Hx of echocardiogram     Echo 5/16:  EF 55-60%, no RWMA, mod LAE  . Thrombocytopenia ([place])   . Cholelithiasis   . Rectal varices   . PFO (patent foramen ovale): Per TEE 04/20/2015 04/20/2015  . Bacteremia       Past Surgical History  Procedure Laterality Date  . Cardiac catheterization  07/01/2010    BMS to CFX.  Marland Kitchen Appendectomy    . Orif r leg    . Esophagogastroduodenoscopy  04/13/2012    Procedure: ESOPHAGOGASTRODUODENOSCOPY (EGD);  Surgeon: Inda Castle, MD;  Location: [facility] END[facility];  Service: Endoscopy;  Laterality: N/A;  . Colonoscopy  04/13/2012    Procedure: COLONOSCOPY;   Surgeon: Inda Castle, MD;  Location: [facility] ENDOSCOPY;  Service: Endoscopy;  Laterality: N/A;  . Esophagogastroduodenoscopy N/A 01/01/2014    Procedure: ESOPHAGOGASTRODUODENOSCOPY (EGD);  Surgeon: Jerene Bears, MD;  Location: [facility] ENDOSCOPY;  Service: Endoscopy;  Laterality: N/A;  . Esophageal banding N/A 01/01/2014    Procedure: ESOPHAGEAL BANDING;  Surgeon: Jerene Bears, MD;  Location: [facility] ENDOSCOPY;  Service: Endoscopy;  Laterality: N/A;  . Coronary stent placement  06/30/2010    CFX   Distal        . Left heart catheterization with coronary angiogram N/A 10/15/2014    Procedure: LEFT HEART CATHETERIZATION WITH CORONARY ANGIOGRAM;  Surgeon: Peter M Martinique, MD;  Location: [facility] CATH LAB;  Service: Cardiovascular;  Laterality: N/A;  . Tee without cardioversion N/A 04/20/2015    Procedure: TRANSESOPHAGEAL ECHOCARDIOGRAM (TEE);  Surgeon: Fay Records, MD;  Location: [facility];  Service: Cardiovascular;  Laterality: N/A;  in for   Chief Complaint  Patient presents with  . Altered Mental Status     HPI This is a 59 year old male patient with known history of cryptogenic/NASH cirrhosis with known esophageal varices and recurrent hepatic encephalopathy. He also has underlying diabetes, dyslipidemia and cholelithiasis. He was most recently discharged on 04/20/15 after admission for sepsis related to healthcare acquired pneumonia and streptococcal bacteremia. Evaluated by the infectious disease service who felt the etiology to the bacteremia was secondary to poor dentition. He underwent a TEE that admission and was found to have a large PFO but was asymptomatic regarding neurological or pre-stroke symptoms. He's had GI bleeds in the past requiring transfusions and has a platelet count less than 100,000 (primarily in the 30-50,000 range) therefore anticoagulation was not initiated during last admission. He was discharged with a PICC line in place and completed a two-week course of IV Rocephin. He was sent to  the ER from home via EMS after family noticed altered mentation over the past 24 hours. Yesterday afternoon/evening patient was having difficulty recognizing familiar objects and by this morning his wife found him unresponsive. According to the wife this is a typical presentation for when his ammonia level gets too high. Wife was not at bedside while patient in ER; EDP did speak with wife (who was at work) who confirmed patient does take his lactulose and rifaximin as ordered.  After arrival to the ER CT of the head was obtained that showed no acute abnormal abnormalities and the patient's ammonia level was found to be markedly elevated for him at 229. In the past patient's ammonia level has been as high as 187 and on 10/22 ammonia level was 99. Patient's vital signs were stable noting he was afebrile and normotensive with normal O2 sat duration on room air. Electrolyte panel unremarkable with stable LFTs with total bilirubin 1.4, ALT 77 and alkaline phosphatase 139. Glucose stable at 152. White count slightly low at 3600 which is normal for this patient. Platelets were 52,000. Patient appears slightly hemoconcentrated when compared to baseline with current hemoglobin 13.8 with previous readings between 11.3 and 12.9. Urinalysis as well as urine drug screen was unremarkable. Fecal occult blood was negative.   Review of Systems   **Unable to obtain from patient secondary to altered mentation-history prior to arrival obtained from [place] based on his conversation with the patient's wife-the remainder of the past medical history obtained from the EMR  Social History Social History  Substance Use Topics  . Smoking status: Current Every Day Smoker -- 1.00 packs/day for 36 years    Types: E-cigarettes  . Smokeless tobacco: Current User     Comment: uses vapor cigarettes (2016 )  . Alcohol Use: No    Resides at: Private residence  Lives with: Spouse  Ambulatory status: Without assistive  devices   Family History Family History  Problem Relation Age of Onset  . Heart attack Brother 75    MI  . Diabetes Brother   . Heart attack Father   . Diabetes Father   . COPD Father   . COPD Mother   . Stroke Neg Hx   . Heart attack Brother      Prior to Admission medications   Medication Sig Start Date End Date Taking? Authorizing Provider  aspirin 81 MG tablet Take 81 mg by mouth daily.     Historical Provider, MD  atorvastatin (LIPITOR) 10 MG tablet Take 10 mg by mouth daily at 6 PM.  Historical Provider, MD  furosemide (LASIX) 40 MG tablet Take 40 mg by mouth daily.  01/09/12   Larey Dresser, MD  insulin NPH Human (HUMULIN N,NOVOLIN N) 100 UNIT/ML injection Inject 0.35-0.8 mLs (35-80 Units total) into the skin See admin instructions. Take 80 units in the morning and 35 units in the evening. 04/20/15   Eugenie Filler, MD  lactulose (CHRONULAC) 10 GM/15ML solution Take 30 g by mouth 3 (three) times daily. May take an additional dose as needed for bowel movement    Historical Provider, MD  metFORMIN (GLUCOPHAGE) 1000 MG tablet Take 1,000 mg by mouth 2 (two) times daily. 11/27/14   Historical Provider, MD  Multiple Vitamin (MULTIVITAMIN) capsule Take 1 capsule by mouth daily.      Historical Provider, MD  nadolol (CORGARD) 40 MG tablet Take 1 tablet (40 mg total) by mouth daily. 01/01/15   Jerene Bears, MD  nitroGLYCERIN (NITROSTAT) 0.4 MG SL tablet Place 0.4 mg under the tongue every 5 (five) minutes as needed for chest pain (chest pain).    Historical Provider, MD  omeprazole (PRILOSEC) 20 MG capsule Take 1 capsule (20 mg total) by mouth daily. 01/15/15   Larey Dresser, MD  ranolazine (RANEXA) 500 MG 12 hr tablet Take 2 tablets (1,000 mg total) by mouth 2 (two) times daily. 01/15/15   Larey Dresser, MD  rifaximin (XIFAXAN) 550 MG TABS tablet Take 1 tablet (550 mg total) by mouth 2 (two) times daily. 02/13/15   Jerene Bears, MD  spironolactone (ALDACTONE) 25 MG tablet Take 1  tablet (25 mg total) by mouth daily. 04/20/15   Eugenie Filler, MD  sulfamethoxazole-trimethoprim (BACTRIM DS,SEPTRA DS) 800-160 MG tablet Take 1 tablet by mouth daily. Resume in 2 weeks after IV antibiotics have been completed. 05/04/15   Eugenie Filler, MD    Allergies  Allergen Reactions  . Isosorbide Other (See Comments)    Nose bleeds  . Lactose Intolerance (Gi) Other (See Comments)    Physical Exam  Vitals  Blood pressure 124/59, pulse 55, temperature 98.3 F (36.8 C), temperature source Rectal, resp. rate 15, SpO2 97 %.   General:  In moderate distress as evidenced by ongoing encephalopathy with periods of lethargy alternating with agitation  Psych: Primarily lethargic although patient awakened quickly and got out of bed without assistance secondary to need to void; remains confused and at best responds to questions asked with one-word replies such as "yes" or "no"  Neuro:   No focal neurological deficits, CN II through XII grossly intact based on limited exam, Strength 5/5 all 4 extremities although patient noted to be unsteady on his feet, Sensation intact all 4 extremities.  ENT:  Ears and Eyes appear Normal, Conjunctivae clear, PER. Dry oral mucosa without erythema or exudates. Poor dentition  Neck:  Supple, No lymphadenopathy appreciated  Respiratory:  Symmetrical chest wall movement, Good air movement bilaterally, coarse to auscultation without definitive rhonchi wheezes or crackles. Room Air  Cardiac:  RRR, No Murmurs, trace bilateral LE edema noted, no JVD, No carotid bruits, peripheral pulses palpable at 2+  Abdomen:  Positive bowel sounds, Soft, may be somewhat tender upper quadrant but without guarding or rebounding, slightly distended without obvious ascites or fluid wave noted,  No masses appreciated  Skin:  No Cyanosis, Normal Skin Turgor, No Skin Rash or Bruise. Has a pale to yellowish cast to his skin tone  Extremities: Symmetrical without obvious  trauma or injury,  no effusions. PICC line  in place per right antecubital area-site unremarkable  Data Review  CBC  Recent Labs Lab 05/02/15 1945 05/07/15 0951  WBC 4.6 3.6*  HGB 12.1* 13.8  HCT 35.7* 39.9  PLT 66* 52*  MCV 100.8* 100.0  MCH 34.2* 34.6*  MCHC 33.9 34.6  RDW 13.7 13.7  LYMPHSABS 1.3 0.9  MONOABS 0.5 0.5  EOSABS 0.2 0.2  BASOSABS 0.0 0.0    Chemistries   Recent Labs Lab 05/02/15 1945 05/07/15 0951  NA 139 139  K 4.0 4.3  CL 106 105  CO2 25 25  GLUCOSE 150* 152*  BUN 9 7  CREATININE 0.70 0.84  CALCIUM 9.1 9.3  AST 55* 77*  ALT 35 40  ALKPHOS 111 139*  BILITOT 1.6* 1.4*    estimated creatinine clearance is 120.9 mL/min (by C-G formula based on Cr of 0.84).  No results for input(s): TSH, T4TOTAL, T3FREE, THYROIDAB in the last 72 hours.  Invalid input(s): FREET3  Coagulation profile  Recent Labs Lab 05/07/15 0951  INR 1.34    No results for input(s): DDIMER in the last 72 hours.  Cardiac Enzymes No results for input(s): CKMB, TROPONINI, MYOGLOBIN in the last 168 hours.  Invalid input(s): CK  Invalid input(s): POCBNP  Urinalysis    Component Value Date/Time   COLORURINE YELLOW 05/07/2015 1005   APPEARANCEUR CLEAR 05/07/2015 1005   LABSPEC 1.012 05/07/2015 1005   PHURINE 7.0 05/07/2015 1005   GLUCOSEU NEGATIVE 05/07/2015 1005   HGBUR NEGATIVE 05/07/2015 1005   BILIRUBINUR NEGATIVE 05/07/2015 1005   KETONESUR NEGATIVE 05/07/2015 1005   PROTEINUR NEGATIVE 05/07/2015 1005   UROBILINOGEN 0.2 05/07/2015 1005   NITRITE NEGATIVE 05/07/2015 1005   LEUKOCYTESUR NEGATIVE 05/07/2015 1005    Imaging results:   Dg Orthopantogram  04/16/2015  CLINICAL DATA:  Poor dentition.  Bacteremia. EXAM: ORTHOPANTOGRAM/PANORAMIC COMPARISON:  None. FINDINGS: Patient is missing multiple teeth including the upper central incisors. There is concern for large caries involving the right upper lateral incisor. There may also be large caries involving the  left upper bicuspid. The lower central incisors may be broken but the roots appear to be present. Concern for caries involving the left lower lateral incisor. The molars have been removed. There may be a small molar remnant on the upper left. Mandible is intact. There may be mild periapical lucency involving the remnants of the lower central incisors. IMPRESSION: Concern for multiple caries. Subtle periapical lucency involving the lower central incisors. Electronically Signed   By: Markus Daft M.D.   On: 04/16/2015 17:21   Dg Chest 2 View  05/02/2015  CLINICAL DATA:  Altered mental status. History of healthcare acquired pneumonia. EXAM: CHEST  2 VIEW COMPARISON:  04/14/2015 FINDINGS: Tip of the right upper extremity PICC in the mid proximal SVC cardiomediastinal contours are unchanged, heart at the upper limits of normal in size. Improving pulmonary edema with persistent vascular congestion. No confluent airspace disease, pleural effusion or pneumothorax. Degenerative change in the thoracic spine. IMPRESSION: 1. No acute process. 2. Tip of the right upper extremity PICC in the SVC. Electronically Signed   By: Jeb Levering M.D.   On: 05/02/2015 20:08   Ct Head Wo Contrast  05/07/2015  CLINICAL DATA:  Altered mental status. Found unresponsive this morning. History of nonalcoholic cirrhosis and hepatic encephalopathy. EXAM: CT HEAD WITHOUT CONTRAST TECHNIQUE: Contiguous axial images were obtained from the base of the skull through the vertex without intravenous contrast. COMPARISON:  04/14/2015 FINDINGS: There is no evidence of acute cortical infarct,  intracranial hemorrhage, mass, midline shift, or extra-axial fluid collection. Ventricles and sulci are normal. Patchy hypodensities in the subcortical deep cerebral white matter bilaterally are similar to the prior study and nonspecific but compatible with mild chronic small vessel ischemic disease. Small, chronic right occipital lobe cortical infarct is  unchanged. Prior right cataract extraction is noted. Paranasal sinuses and mastoid air cells are clear. Right maxillary sinus is small. Calcified atherosclerosis is noted at the skullbase. No skull fracture is identified. IMPRESSION: 1. No evidence of acute intracranial abnormality. 2. Chronic ischemic change. Electronically Signed   By: Logan Bores M.D.   On: 05/07/2015 12:04   Ct Head Wo Contrast  04/14/2015  CLINICAL DATA:  Acute onset of altered mental status. Fruity odor to breath. Personal history of elevated ammonia. Initial encounter. EXAM: CT HEAD WITHOUT CONTRAST TECHNIQUE: Contiguous axial images were obtained from the base of the skull through the vertex without intravenous contrast. COMPARISON:  CT of the head performed 01/19/2015 FINDINGS: There is no evidence of acute infarction, mass lesion, or intra- or extra-axial hemorrhage on CT. Scattered periventricular and subcortical white matter change likely reflects small vessel ischemic microangiopathy. A small chronic infarct is noted at the right occipital lobe. The posterior fossa, including the cerebellum, brainstem and fourth ventricle, is within normal limits. The third and lateral ventricles, and basal ganglia are unremarkable in appearance. No mass effect or midline shift is seen. There is no evidence of fracture; visualized osseous structures are unremarkable in appearance. The orbits are within normal limits. The paranasal sinuses and mastoid air cells are well-aerated. No significant soft tissue abnormalities are seen. IMPRESSION: 1. No acute intracranial pathology seen on CT. 2. Scattered small vessel ischemic microangiopathy. Small chronic infarct at the right occipital lobe. Electronically Signed   By: Garald Balding M.D.   On: 04/14/2015 04:09   US Abdomen Complete  04/18/2015  CLINICAL DATA:  Acute onset of bacteremia. Ascites. Initial encounter. EXAM: ULTRASOUND ABDOMEN COMPLETE COMPARISON:  CT of the abdomen and pelvis performed  04/14/2015 FINDINGS: Gallbladder: A few stones are seen within the gallbladder. The gallbladder is otherwise unremarkable. No significant gallbladder wall thickening or pericholecystic fluid is seen. No ultrasonographic Murphy's sign is elicited. Common bile duct: Diameter: 0.7 cm, borderline prominent. Liver: No focal lesion identified. Within normal limits in parenchymal echogenicity. Difficult to fully characterize due to the patient's habitus. IVC: No abnormality visualized. Pancreas: Not visualized. Spleen: Size and appearance within normal limits. Right Kidney: Length: 12.2 cm. Echogenicity within normal limits. No mass or hydronephrosis visualized. Left Kidney: Length: 11.6 cm. Echogenicity within normal limits. No mass or hydronephrosis visualized. Abdominal aorta: No aneurysm visualized. Other findings: None. IMPRESSION: 1. No acute abnormality seen within the abdomen. Evaluation suboptimal due to the patient's habitus. 2. Cholelithiasis. Gallbladder otherwise unremarkable. Slight prominence of the common bile duct likely remains within normal limits. Electronically Signed   By: Garald Balding M.D.   On: 04/18/2015 00:44   Dg Chest Port 1 View  04/14/2015  CLINICAL DATA:  Acute onset of shortness of breath. Initial encounter. EXAM: PORTABLE CHEST 1 VIEW COMPARISON:  Chest radiograph performed 01/31/2015 FINDINGS: The lungs are hypoexpanded. Vascular congestion is noted, with increased interstitial markings, concerning for mild pulmonary edema. There is no evidence of pleural effusion or pneumothorax. The cardiomediastinal silhouette is borderline normal in size. No acute osseous abnormalities are seen. IMPRESSION: Lungs hypoexpanded. Vascular congestion, with increased interstitial markings, concerning for mild pulmonary edema. Electronically Signed   By: Francoise Schaumann.D.  On: 04/14/2015 01:26   Ct Renal Stone Study  04/14/2015  CLINICAL DATA:  Altered mental status, first noticed around 20:30.  EXAM: CT ABDOMEN AND PELVIS WITHOUT CONTRAST TECHNIQUE: Multidetector CT imaging of the abdomen and pelvis was performed following the standard protocol without IV contrast. COMPARISON:  12/29/2013 FINDINGS: No acute finding is evident in the lower chest. The liver is cirrhotic. No focal liver lesion is evident on this unenhanced scan. There is no bile duct dilatation. There are numerous calculi within the gallbladder lumen. There are numerous very large varices, including very prominent varices around the distal esophagus and stomach. There is recanalization of the umbilical vein with caput medusa. There are numerous varices elsewhere in the abdomen and pelvis. The portal vein is enlarged and partially calcified. The calcification corresponds to the area of partial thrombosis observed on 12/29/2013. Patency of the portal vein cannot be established on this study. There is mild splenomegaly, probably slightly worsened. There is a very small volume of ascites collected dependently in the pelvis. There is a 2 x 3 mm lower pole left collecting system calculus. There is no hydronephrosis or ureteral dilatation. There is no bowel obstruction. There is no extraluminal air. There is hazy high attenuation in the mesentery which is worsened from 12/29/2013. This mesenteric edema could be related to portal hypertension. No significant skeletal lesion is evident. IMPRESSION: 1. Cirrhosis with portal hypertension. Very large varices. Patency of the portal vein is indeterminate. 2. Very small volume of ascites collected dependently in the pelvis, new. Worsened mesenteric edema. 3. Mild splenomegaly, probably mildly worsened. 4. Left nephrolithiasis. 5. No evidence of bowel obstruction or perforation. No focal inflammatory changes are evident. 6. Cholelithiasis Electronically Signed   By: Andreas Newport M.D.   On: 04/14/2015 04:19     Assessment & Plan  Principal Problem:   Acute hepatic encephalopathy (HCC) -Admit to  step down -Safety sitter -Mentation inappropriate for oral medications so we'll utilize lactulose enemas twice a day -Repeat ammonia level in a.m. -Based on the rest of his laboratory data, he does not appear to have acute decompensated cirrhosis and I am concerned that patient may not be taking his medications as prescribed -Resume oral lactulose and rifaximin once mentation improved -Check portable chest x-ray to rule out infectious etiology is exacerbating factor for current encephalopathy -Urinalysis unremarkable for infection  Active Problems:   Esophageal varices/Cryptogenic cirrhosis (HCC) -LFTs stable and at baseline for patient so this points against decompensated cirrhosis -On chronic Bactrim as an outpatient and suspect this is for SBP prophylaxis    Acute urinary retention -Foley catheter placed in the ER    Type 2 diabetes mellitus, uncontrolled ([place]) -Current CBGs in the 150 range -Hemoglobin A1c was 8.2 on 10/4 -Home metformin on hold -Provide SSI    Streptococcal bacteremia -Patient has completed 2 weeks of IV Rocephin -Follow-up blood cultures on 10/22 as an outpatient showed no growth -Consider discontinuation of right antecubital PICC line during this admission    PFO (patent foramen ovale): Per TEE 04/20/2015 -Followed by cardiology as an outpatient -Because of underlying history of GI bleed in setting of cirrhosis and thrombocytopenia currently not on anticoagulation   CAD -Preadmission medications on hold due to acute encephalopathy    HLD (hyperlipidemia) -On statin prior to admission    DIASTOLIC HEART FAILURE, CHRONIC -Currently compensated    Cholelithiasis -History of prior common bile duct stone -Patient seems to have some mild upper abdominal pain therefore we will check lipase  DVT Prophylaxis: SCDs  Family Communication:   No family at bedside-EDP spoke with wife by telephone  Code Status:  Full code  Condition:   Guarded  Discharge disposition: Anticipate discharge back to home environment hopefully in the next 48-72 hours pending resolution of encephalopathy in improvement in ammonia level  Time spent in minutes : 60      Brain Honeycutt L. ANP on 05/07/2015 at 1:03 PM  Between 7am to 7pm - Pager - [phone]  After 7pm go to [email or web address] - password [email or web address]  And look for the night coverage person covering me after hours  [facility]

## 2015-05-07 NOTE — ED Notes (Signed)
Pt arrives from home via [facility]. Pt has a hx of non-alcoholic cirrhosis and hepatic encephalopathy. Pt was found this morning by his wife to be unresponsive. Pt wife told EMS this is how the pt acts when his ammonia levels are high. Upon arrival pt is arousable to painful stimuli, but unable to follow commands and is combative.

## 2015-05-08 ENCOUNTER — Encounter: Payer: Self-pay | Admitting: *Deleted

## 2015-05-08 DIAGNOSIS — Z9889 Other specified postprocedural states: Secondary | ICD-10-CM

## 2015-05-08 DIAGNOSIS — Z959 Presence of cardiac and vascular implant and graft, unspecified: Secondary | ICD-10-CM

## 2015-05-08 DIAGNOSIS — K0889 Other specified disorders of teeth and supporting structures: Secondary | ICD-10-CM

## 2015-05-08 DIAGNOSIS — K7200 Acute and subacute hepatic failure without coma: Principal | ICD-10-CM

## 2015-05-08 LAB — GLUCOSE, CAPILLARY
GLUCOSE-CAPILLARY: 103 mg/dL — AB (ref 65–99)
GLUCOSE-CAPILLARY: 105 mg/dL — AB (ref 65–99)
GLUCOSE-CAPILLARY: 180 mg/dL — AB (ref 65–99)
Glucose-Capillary: 99 mg/dL (ref 65–99)
Glucose-Capillary: 148 mg/dL — ABNORMAL HIGH (ref 65–99)

## 2015-05-08 LAB — COMPREHENSIVE METABOLIC PANEL
ALBUMIN: 2.8 g/dL — AB (ref 3.5–5.0)
ALK PHOS: 85 U/L (ref 38–126)
ALT: 39 U/L (ref 17–63)
AST: 69 U/L — AB (ref 15–41)
Anion gap: 7 (ref 5–15)
BUN: 9 mg/dL (ref 6–20)
CALCIUM: 8.7 mg/dL — AB (ref 8.9–10.3)
CO2: 25 mmol/L (ref 22–32)
CREATININE: 0.86 mg/dL (ref 0.61–1.24)
Chloride: 103 mmol/L (ref 101–111)
GFR calc Af Amer: >60 mL/min (ref >60)
GFR calc non Af Amer: >60 mL/min (ref >60)
GLUCOSE: 112 mg/dL — AB (ref 65–99)
Potassium: 3.6 mmol/L (ref 3.5–5.1)
SODIUM: 135 mmol/L (ref 135–145)
TOTAL PROTEIN: 6.1 g/dL — AB (ref 6.5–8.1)
Total Bilirubin: 2.6 mg/dL — ABNORMAL HIGH (ref 0.3–1.2)

## 2015-05-08 LAB — CBC
HEMATOCRIT: 37.6 % — AB (ref 39.0–52.0)
HEMOGLOBIN: 12.9 g/dL — AB (ref 13.0–17.0)
MCH: 34.2 pg — AB (ref 26.0–34.0)
MCHC: 34.3 g/dL (ref 30.0–36.0)
MCV: 99.7 fL (ref 78.0–100.0)
Platelets: 60 10*3/uL — ABNORMAL LOW (ref 150–400)
RBC: 3.77 MIL/uL — AB (ref 4.22–5.81)
RDW: 14 % (ref 11.5–15.5)
WBC: 5.9 10*3/uL (ref 4.0–10.5)

## 2015-05-08 LAB — AMMONIA: AMMONIA: 65 umol/L — AB (ref 9–35)

## 2015-05-08 MED ORDER — LACTULOSE 10 GM/15ML PO SOLN
30.0000 g | Freq: Three times a day (TID) | ORAL | Status: DC
  Administered 2015-05-08 (×2): 30 g via ORAL
  Filled 2015-05-08 (×3): qty 45

## 2015-05-08 NOTE — Consult Note (Signed)
           [facility]    Date of Admission:  05/07/2015  Date of Consult:  05/08/2015  Reason for Consult: length of therapy for viridans group streptococcal bacteremia Referring Physician: Dr. Vega   HPI: Adam Butler is an 59 y.o. male with cryptogenic cirrhosis coronary artery disease who developed viridans group streptococcal bacteremia. He was seen by our group and plans were for 4 weeks of IV antibiotics. In the interim he actually underwent a transesophageal echocardiogram which failed to show any evidence of endocarditis. He has now been admitted due to encephalopathy related to his liver disease.  I was asked see the patient due to concerns about whether he needed further antimicrobial therapy at this point in time. Based on the fact that he does not have any endocarditis by TEE and that he has had now 3 weeks of IV antibiotics since clearance of his blood cultures I think is appropriate to stop his antibiotics and pull his PICC line.   Past Medical History  Diagnosis Date  . Cirrhosis (HCC) 2011    Cryptogenic, Likely NASH. Family/pt deny EtOH. HCV, HBV, HAV negative. ANA negative. AMA positive. Ascites 12/11  . Hyperlipemia   . Coronary artery disease     Inferior MI 12/11; LHC with occluded mid CFX and 80% proximal RCA. EF 55%. He had 3.0 x 28 vision BMS to CFX  . Diastolic CHF, acute (HCC)     Echo 12/11 with ef 50-55% and mild LVH. EF 55% by LV0gram in 12/11  . Hypertension   . Type II diabetes mellitus (HCC) 2011  . SVT (supraventricular tachycardia) (HCC)     1/12: appeared to be an ectopic atrial tachycardia. Required DCCV with hemodynamic instability  . GI bleed     12/11: Etiology not clearly defined. EGD: nonbleeding esophageal varices.  . S/P coronary artery stent placement 06/2010  . Esophageal varices (HCC) 2011, 2013    no hx acute variceal bleed  . Hepatic encephalopathy (HCC) 2011, 12/2013  . Myocardial infarction  (HCC) ? 2012  . Shortness of breath dyspnea   . Arthritis   . Hx of echocardiogram     Echo 5/16:  EF 55-60%, no RWMA, mod LAE  . Thrombocytopenia (HCC)   . Cholelithiasis   . Rectal varices   . PFO (patent foramen ovale): Per TEE 04/20/2015 04/20/2015  . Bacteremia     Past Surgical History  Procedure Laterality Date  . Cardiac catheterization  07/01/2010    BMS to CFX.  . Appendectomy    . Orif r leg    . Esophagogastroduodenoscopy  04/13/2012    Procedure: ESOPHAGOGASTRODUODENOSCOPY (EGD);  Surgeon: Robert D Kaplan, MD;  Location: [facility];  Service: Endoscopy;  Laterality: N/A;  . Colonoscopy  04/13/2012    Procedure: COLONOSCOPY;  Surgeon: Robert D Kaplan, MD;  Location: [facility];  Service: Endoscopy;  Laterality: N/A;  . Esophagogastroduodenoscopy N/A 01/01/2014    Procedure: ESOPHAGOGASTRODUODENOSCOPY (EGD);  Surgeon: Jay M Pyrtle, MD;  Location: [facility];  Service: Endoscopy;  Laterality: N/A;  . Esophageal banding N/A 01/01/2014    Procedure: ESOPHAGEAL BANDING;  Surgeon: Jay M Pyrtle, MD;  Location: [facility];  Service: Endoscopy;  Laterality: N/A;  . Coronary stent placement  06/30/2010    CFX   Distal        . Left heart catheterization with coronary angiogram N/A 10/15/2014    Procedure: LEFT HEART CATHETERIZATION WITH CORONARY ANGIOGRAM;    Surgeon: Peter M Jordan, MD;  Location: [facility];  Service: Cardiovascular;  Laterality: N/A;  . Tee without cardioversion N/A 04/20/2015    Procedure: TRANSESOPHAGEAL ECHOCARDIOGRAM (TEE);  Surgeon: Paula Ross V, MD;  Location: [facility];  Service: Cardiovascular;  Laterality: N/A;    Social History:  reports that he has been smoking E-cigarettes.  He has a 36 pack-year smoking history. He uses smokeless tobacco. He reports that he does not drink alcohol or use illicit drugs.   Family History  Problem Relation Age of Onset  . Heart attack Brother 50    MI  . Diabetes Brother   . Heart attack Father   .  Diabetes Father   . COPD Father   . COPD Mother   . Stroke Neg Hx   . Heart attack Brother     Allergies  Allergen Reactions  . Isosorbide Other (See Comments)    Nose bleeds  . Lactose Intolerance (Gi) Other (See Comments)     Medications: I have reviewed patients current medications as documented in Epic Anti-infectives    Start     Dose/Rate Route Frequency Ordered Stop   05/07/15 1430  sulfamethoxazole-trimethoprim (BACTRIM) 160 mg in dextrose 5 % 250 mL IVPB  Status:  Discontinued     160 mg 260 mL/hr over 60 Minutes Intravenous 3 times per day 05/07/15 1419 05/07/15 1428         ROS: As per history present was was 12 point review systems negative Blood pressure 117/47, pulse 69, temperature 97.9 F (36.6 C), temperature source Oral, resp. rate 17, height 6' (1.829 m), weight 275 lb 5.7 oz (124.9 kg), SpO2 98 %. General: Alert and awake, oriented x3, not in any acute distress. HEENT: anicteric sclera,  EOMI, oropharynx clear and poor dentition Cardiovascular: egular rate, normal r,  no murmur rubs or gallops Pulmonary: clear to auscultation bilaterally, no wheezing, rales or rhonchi Gastrointestinal: soft nontender, slightly distended, normal bowel sounds, Musculoskeletal: no  clubbing or edema noted bilaterally Skin, soft tissue: no rashes Neuro: nonfocal, strength and sensation intact   Results for orders placed or performed during the hospital encounter of 05/07/15 (from the past 48 hour(s))  Comprehensive metabolic panel     Status: Abnormal   Collection Time: 05/07/15  9:51 AM  Result Value Ref Range   Sodium 139 135 - 145 mmol/L   Potassium 4.3 3.5 - 5.1 mmol/L   Chloride 105 101 - 111 mmol/L   CO2 25 22 - 32 mmol/L   Glucose, Bld 152 (H) 65 - 99 mg/dL   BUN 7 6 - 20 mg/dL   Creatinine, Ser 0.84 0.61 - 1.24 mg/dL   Calcium 9.3 8.9 - 10.3 mg/dL   Total Protein 6.3 (L) 6.5 - 8.1 g/dL   Albumin 2.9 (L) 3.5 - 5.0 g/dL   AST 77 (H) 15 - 41 U/L   ALT 40 17  - 63 U/L   Alkaline Phosphatase 139 (H) 38 - 126 U/L   Total Bilirubin 1.4 (H) 0.3 - 1.2 mg/dL   GFR calc non Af Amer >60 >60 mL/min   GFR calc Af Amer >60 >60 mL/min    Comment: (NOTE) The eGFR has been calculated using the CKD EPI equation. This calculation has not been validated in all clinical situations. eGFR's persistently <60 mL/min signify possible Chronic Kidney Disease.    Anion gap 9 5 - 15  CBC with Differential/Platelet     Status: Abnormal   Collection Time: 05/07/15    9:51 AM  Result Value Ref Range   WBC 3.6 (L) 4.0 - 10.5 K/uL   RBC 3.99 (L) 4.22 - 5.81 MIL/uL   Hemoglobin 13.8 13.0 - 17.0 g/dL   HCT 39.9 39.0 - 52.0 %   MCV 100.0 78.0 - 100.0 fL   MCH 34.6 (H) 26.0 - 34.0 pg   MCHC 34.6 30.0 - 36.0 g/dL   RDW 13.7 11.5 - 15.5 %   Platelets 52 (L) 150 - 400 K/uL    Comment: SPECIMEN CHECKED FOR CLOTS REPEATED TO VERIFY PLATELET COUNT CONFIRMED BY SMEAR    Neutrophils Relative % 57 %   Neutro Abs 2.1 1.7 - 7.7 K/uL   Lymphocytes Relative 24 %   Lymphs Abs 0.9 0.7 - 4.0 K/uL   Monocytes Relative 13 %   Monocytes Absolute 0.5 0.1 - 1.0 K/uL   Eosinophils Relative 6 %   Eosinophils Absolute 0.2 0.0 - 0.7 K/uL   Basophils Relative 1 %   Basophils Absolute 0.0 0.0 - 0.1 K/uL  Protime-INR     Status: Abnormal   Collection Time: 05/07/15  9:51 AM  Result Value Ref Range   Prothrombin Time 16.7 (H) 11.6 - 15.2 seconds   INR 1.34 0.00 - 1.49  CBG monitoring, ED     Status: Abnormal   Collection Time: 05/07/15  9:58 AM  Result Value Ref Range   Glucose-Capillary 133 (H) 65 - 99 mg/dL  Urinalysis, Routine w reflex microscopic (not at [facility])     Status: None   Collection Time: 05/07/15 10:05 AM  Result Value Ref Range   Color, Urine YELLOW YELLOW   APPearance CLEAR CLEAR   Specific Gravity, Urine 1.012 1.005 - 1.030   pH 7.0 5.0 - 8.0   Glucose, UA NEGATIVE NEGATIVE mg/dL   Hgb urine dipstick NEGATIVE NEGATIVE   Bilirubin Urine NEGATIVE NEGATIVE   Ketones,  ur NEGATIVE NEGATIVE mg/dL   Protein, ur NEGATIVE NEGATIVE mg/dL   Urobilinogen, UA 0.2 0.0 - 1.0 mg/dL   Nitrite NEGATIVE NEGATIVE   Leukocytes, UA NEGATIVE NEGATIVE    Comment: MICROSCOPIC NOT DONE ON URINES WITH NEGATIVE PROTEIN, BLOOD, LEUKOCYTES, NITRITE, OR GLUCOSE <1000 mg/dL.  Urine rapid drug screen (hosp performed)     Status: None   Collection Time: 05/07/15 10:05 AM  Result Value Ref Range   Opiates NONE DETECTED NONE DETECTED   Cocaine NONE DETECTED NONE DETECTED   Benzodiazepines NONE DETECTED NONE DETECTED   Amphetamines NONE DETECTED NONE DETECTED   Tetrahydrocannabinol NONE DETECTED NONE DETECTED   Barbiturates NONE DETECTED NONE DETECTED    Comment:        DRUG SCREEN FOR MEDICAL PURPOSES ONLY.  IF CONFIRMATION IS NEEDED FOR ANY PURPOSE, NOTIFY LAB WITHIN 5 DAYS.        LOWEST DETECTABLE LIMITS FOR URINE DRUG SCREEN Drug Class       Cutoff (ng/mL) Amphetamine      1000 Barbiturate      200 Benzodiazepine   485 Tricyclics       462 Opiates          300 Cocaine          300 THC              50   POC occult blood, ED Provider will collect     Status: None   Collection Time: 05/07/15 10:13 AM  Result Value Ref Range   Fecal Occult Bld NEGATIVE NEGATIVE  Ammonia     Status: Abnormal  Collection Time: 05/07/15 10:23 AM  Result Value Ref Range   Ammonia 229 (H) 9 - 35 umol/L  Lipase, blood     Status: Abnormal   Collection Time: 05/07/15  1:33 PM  Result Value Ref Range   Lipase 57 (H) 11 - 51 U/L    Comment: Please note change in reference range.  MRSA PCR Screening     Status: None   Collection Time: 05/07/15  2:30 PM  Result Value Ref Range   MRSA by PCR NEGATIVE NEGATIVE    Comment:        The GeneXpert MRSA Assay (FDA approved for NASAL specimens only), is one component of a comprehensive MRSA colonization surveillance program. It is not intended to diagnose MRSA infection nor to guide or monitor treatment for MRSA infections.   Glucose,  capillary     Status: Abnormal   Collection Time: 05/07/15  3:19 PM  Result Value Ref Range   Glucose-Capillary 129 (H) 65 - 99 mg/dL   Comment 1 Notify RN    Comment 2 Document in Chart   Glucose, capillary     Status: None   Collection Time: 05/07/15  7:48 PM  Result Value Ref Range   Glucose-Capillary 94 65 - 99 mg/dL  Glucose, capillary     Status: Abnormal   Collection Time: 05/08/15 12:16 AM  Result Value Ref Range   Glucose-Capillary 105 (H) 65 - 99 mg/dL  Glucose, capillary     Status: None   Collection Time: 05/08/15  3:14 AM  Result Value Ref Range   Glucose-Capillary 99 65 - 99 mg/dL  Comprehensive metabolic panel     Status: Abnormal   Collection Time: 05/08/15  3:40 AM  Result Value Ref Range   Sodium 135 135 - 145 mmol/L   Potassium 3.6 3.5 - 5.1 mmol/L   Chloride 103 101 - 111 mmol/L   CO2 25 22 - 32 mmol/L   Glucose, Bld 112 (H) 65 - 99 mg/dL   BUN 9 6 - 20 mg/dL   Creatinine, Ser 0.86 0.61 - 1.24 mg/dL   Calcium 8.7 (L) 8.9 - 10.3 mg/dL   Total Protein 6.1 (L) 6.5 - 8.1 g/dL   Albumin 2.8 (L) 3.5 - 5.0 g/dL   AST 69 (H) 15 - 41 U/L   ALT 39 17 - 63 U/L   Alkaline Phosphatase 85 38 - 126 U/L   Total Bilirubin 2.6 (H) 0.3 - 1.2 mg/dL   GFR calc non Af Amer >60 >60 mL/min   GFR calc Af Amer >60 >60 mL/min    Comment: (NOTE) The eGFR has been calculated using the CKD EPI equation. This calculation has not been validated in all clinical situations. eGFR's persistently <60 mL/min signify possible Chronic Kidney Disease.    Anion gap 7 5 - 15  CBC     Status: Abnormal   Collection Time: 05/08/15  3:40 AM  Result Value Ref Range   WBC 5.9 4.0 - 10.5 K/uL   RBC 3.77 (L) 4.22 - 5.81 MIL/uL   Hemoglobin 12.9 (L) 13.0 - 17.0 g/dL   HCT 37.6 (L) 39.0 - 52.0 %   MCV 99.7 78.0 - 100.0 fL   MCH 34.2 (H) 26.0 - 34.0 pg   MCHC 34.3 30.0 - 36.0 g/dL   RDW 14.0 11.5 - 15.5 %   Platelets 60 (L) 150 - 400 K/uL    Comment: CONSISTENT WITH PREVIOUS RESULT  Ammonia      Status: Abnormal  Collection Time: 05/08/15  3:40 AM  Result Value Ref Range   Ammonia 65 (H) 9 - 35 umol/L  Glucose, capillary     Status: Abnormal   Collection Time: 05/08/15  7:34 AM  Result Value Ref Range   Glucose-Capillary 103 (H) 65 - 99 mg/dL   Comment 1 Notify RN    Comment 2 Document in Chart   Glucose, capillary     Status: Abnormal   Collection Time: 05/08/15 11:03 AM  Result Value Ref Range   Glucose-Capillary 180 (H) 65 - 99 mg/dL   Comment 1 Notify RN    Comment 2 Document in Chart    _0 (sdes,specrequest,cult,reptstatus)   ) Recent Results (from the past 720 hour(s))  Blood Culture (routine x 2)     Status: None   Collection Time: 04/13/15 11:45 PM  Result Value Ref Range Status   Specimen Description BLOOD RIGHT WRIST  Final   Special Requests BOTTLES DRAWN AEROBIC AND ANAEROBIC 10CC   Final   Culture  Setup Time   Final    GRAM POSITIVE COCCI IN PAIRS IN BOTH AEROBIC AND ANAEROBIC BOTTLES CRITICAL RESULT CALLED TO, READ BACK BY AND VERIFIED WITH: D GREGORY,RN AT 1127 04/14/15 BY L BENFIELD    Culture VIRIDANS STREPTOCOCCUS  Final   Report Status 04/16/2015 FINAL  Final   Organism ID, Bacteria VIRIDANS STREPTOCOCCUS  Final      Susceptibility   Viridans streptococcus - MIC*    ERYTHROMYCIN >=8 RESISTANT Resistant     TETRACYCLINE >=16 RESISTANT Resistant     VANCOMYCIN 0.5 SENSITIVE Sensitive     CLINDAMYCIN 0.5 INTERMEDIATE Intermediate     * VIRIDANS STREPTOCOCCUS  Blood Culture (routine x 2)     Status: None   Collection Time: 04/13/15 11:50 PM  Result Value Ref Range Status   Specimen Description BLOOD RIGHT HAND  Final   Special Requests BOTTLES DRAWN AEROBIC AND ANAEROBIC 5CC  Final   Culture  Setup Time   Final    GRAM POSITIVE COCCI IN PAIRS IN CHAINS IN BOTH AEROBIC AND ANAEROBIC BOTTLES CRITICAL RESULT CALLED TO, READ BACK BY AND VERIFIED WITH: Belenda Cruise RN 11:00 04/14/15 ([email or web address])    Culture   Final    VIRIDANS  STREPTOCOCCUS SUSCEPTIBILITIES PERFORMED ON PREVIOUS CULTURE WITHIN THE LAST 5 DAYS.    Report Status 04/16/2015 FINAL  Final  Urine culture     Status: None   Collection Time: 04/14/15 12:30 AM  Result Value Ref Range Status   Specimen Description URINE, RANDOM  Final   Special Requests NONE  Final   Culture MULTIPLE SPECIES PRESENT, SUGGEST RECOLLECTION  Final   Report Status 04/15/2015 FINAL  Final  MRSA PCR Screening     Status: None   Collection Time: 04/14/15  6:47 AM  Result Value Ref Range Status   MRSA by PCR NEGATIVE NEGATIVE Final    Comment:        The GeneXpert MRSA Assay (FDA approved for NASAL specimens only), is one component of a comprehensive MRSA colonization surveillance program. It is not intended to diagnose MRSA infection nor to guide or monitor treatment for MRSA infections.   Respiratory virus panel     Status: None   Collection Time: 04/15/15 12:54 PM  Result Value Ref Range Status   Respiratory Syncytial Virus A Negative Negative Final   Respiratory Syncytial Virus B Negative Negative Final   Influenza A Negative Negative Final   Influenza B Negative Negative Final   Parainfluenza 1  Negative Negative Final   Parainfluenza 2 Negative Negative Final   Parainfluenza 3 Negative Negative Final   Metapneumovirus Negative Negative Final   Rhinovirus Negative Negative Final   Adenovirus Negative Negative Final    Comment: (NOTE) Performed At: [facility] [address] [place],  [id number] [place] William F MD Ph:[phone]   Culture, blood (routine x 2)     Status: None   Collection Time: 04/17/15 12:25 PM  Result Value Ref Range Status   Specimen Description BLOOD BLOOD LEFT HAND  Final   Special Requests BOTTLES DRAWN AEROBIC AND ANAEROBIC 5CC  Final   Culture NO GROWTH 5 DAYS  Final   Report Status 04/22/2015 FINAL  Final  Culture, blood (routine x 2)     Status: None   Collection Time: 04/17/15 12:30 PM  Result Value Ref  Range Status   Specimen Description BLOOD BLOOD RIGHT HAND  Final   Special Requests IN PEDIATRIC BOTTLE 4CC  Final   Culture NO GROWTH 5 DAYS  Final   Report Status 04/22/2015 FINAL  Final  Blood Culture (routine x 2)     Status: None   Collection Time: 05/02/15  7:49 PM  Result Value Ref Range Status   Specimen Description BLOOD PICC LINE  Final   Special Requests BOTTLES DRAWN AEROBIC AND ANAEROBIC 4CC  Final   Culture NO GROWTH 5 DAYS  Final   Report Status 05/07/2015 FINAL  Final  Blood Culture (routine x 2)     Status: None   Collection Time: 05/02/15  7:49 PM  Result Value Ref Range Status   Specimen Description BLOOD LEFT HAND  Final   Special Requests BOTTLES DRAWN AEROBIC ONLY 5CC  Final   Culture NO GROWTH 5 DAYS  Final   Report Status 05/07/2015 FINAL  Final  Urine culture     Status: None   Collection Time: 05/02/15 11:01 PM  Result Value Ref Range Status   Specimen Description URINE, RANDOM  Final   Special Requests NONE  Final   Culture 5,000 COLONIES/mL INSIGNIFICANT GROWTH  Final   Report Status 05/04/2015 FINAL  Final  MRSA PCR Screening     Status: None   Collection Time: 05/07/15  2:30 PM  Result Value Ref Range Status   MRSA by PCR NEGATIVE NEGATIVE Final    Comment:        The GeneXpert MRSA Assay (FDA approved for NASAL specimens only), is one component of a comprehensive MRSA colonization surveillance program. It is not intended to diagnose MRSA infection nor to guide or monitor treatment for MRSA infections.      Impression/Recommendation  Principal Problem:   Acute hepatic encephalopathy (HCC) Active Problems:   HLD (hyperlipidemia)   CAD, NATIVE VESSEL   DIASTOLIC HEART FAILURE, CHRONIC   Esophageal varices (HCC)   Cryptogenic cirrhosis (HCC)   Type 2 diabetes mellitus, uncontrolled (HCC)   Streptococcal bacteremia   PFO (patent foramen ovale): Per TEE 04/20/2015   Cholelithiasis   Acute urinary retention   Hepatic encephalopathy  (HCC)   Adam Butler is a 59 y.o. male with  recent viridans group streptococcal bacteremia status post transesophageal echocardiogram that was negative and status post 3 weeks of IV antibiotics.  #1 Viridans group streptococcal bacteremia:  He has finished sufficient amount of antibiotics in my opinion we'll continue PICC line He should follow-up with Dr. Snyder as an outpatient  He does need to have his teeth worked on and several then extracted. He says   he has trouble paying for this I'll ask case management to look into getting him connected to appropriate resources for this.  #2 hepatic encephalopathy resolving   05/08/2015, 4:19 PM   Thank you so much for this interesting consult  [facility] [phone] (pager) [phone] (office) 05/08/2015, 4:19 PM  Trayce Caravello [place] 05/08/2015, 4:19 PM     

## 2015-05-08 NOTE — Discharge Summary (Signed)
Physician Discharge Summary  Adam Butler GXQ:[id number] DOB: 1956/06/15 DOA: 05/07/2015  PCP: [name], NP  Admit date: 05/07/2015 Discharge date: 05/08/2015  Time spent: > 35 minutes  Recommendations for Outpatient Follow-up:  1. Please ensure compliance with medication to prevent hepatic encephalopathy  Discharge Diagnoses:  Principal Problem:   Acute hepatic encephalopathy (HCC) Active Problems:   HLD (hyperlipidemia)   CAD, NATIVE VESSEL   DIASTOLIC HEART FAILURE, CHRONIC   Esophageal varices (HCC)   Cryptogenic cirrhosis (HCC)   Type 2 diabetes mellitus, uncontrolled (HCC)   Streptococcal bacteremia   PFO (patent foramen ovale): Per TEE 04/20/2015   Cholelithiasis   Acute urinary retention   Hepatic encephalopathy ([place])   Discharge Condition: stable  Diet recommendation: Carb modified diet  Filed Weights   05/07/15 1420  Weight: 124.9 kg (275 lb 5.7 oz)    History of present illness:  59 y.o. male with a Past Medical History of cryptogenic cirrhosis with known esophageal varices and recurrent hepatic encephalopathy, hyperlipidemia, hypertension, diastolic heart failure, and CAD; who presents with acutely altered mental status ammonia level markedly elevated at 229. Presented with hepatic encephalopathy   Hospital Course:  Hepatic encephalopathy  - resolved with administration of rifaximin and lactulose - patient taking lactulose orally. Requesting discharge  History of bacteremia - per office visit note on 05/04/15 patient has completed antibiotic regimen course. (2 weeks) therefore he may have picc line removed. Patient reports completing and he is currently, while in house, not receiving any antibiotics.  Procedures:  None  Consultations:  None  Discharge Exam: Filed Vitals:   05/08/15 1100  BP: 117/47  Pulse: 69  Temp:   Resp: 17    General: Pt in nad, alert and awake Cardiovascular: rrr, no mrg Respiratory: cta bl, no  wheezes  Discharge Instructions   Discharge Instructions    Call MD for:  redness, tenderness, or signs of infection (pain, swelling, redness, odor or green/yellow discharge around incision site)    Complete by:  As directed      Call MD for:  temperature >100.4    Complete by:  As directed      Diet - low sodium heart healthy    Complete by:  As directed      Discharge instructions    Complete by:  As directed   Please follow up with your primary care physician in 1-2 weeks or sooner should any new concerns arise.     Increase activity slowly    Complete by:  As directed           Current Discharge Medication List    CONTINUE these medications which have NOT CHANGED   Details  aspirin 81 MG tablet Take 81 mg by mouth daily.     atorvastatin (LIPITOR) 10 MG tablet Take 10 mg by mouth daily at 6 PM.    furosemide (LASIX) 40 MG tablet Take 40 mg by mouth daily.     insulin NPH Human (HUMULIN N,NOVOLIN N) 100 UNIT/ML injection Inject 0.35-0.8 mLs (35-80 Units total) into the skin See admin instructions. Take 80 units in the morning and 35 units in the evening. Qty: 10 mL, Refills: 0    lactulose (CHRONULAC) 10 GM/15ML solution Take 30 g by mouth 3 (three) times daily. May take an additional dose as needed for bowel movement    metFORMIN (GLUCOPHAGE) 1000 MG tablet Take 1,000 mg by mouth 2 (two) times daily. Refills: 11    Multiple Vitamin (MULTIVITAMIN) capsule Take  1 capsule by mouth daily.      nadolol (CORGARD) 40 MG tablet Take 1 tablet (40 mg total) by mouth daily. Qty: 30 tablet, Refills: 5   Associated Diagnoses: Encephalopathy, hepatic ([place]); Liver cirrhosis secondary to nonalcoholic steatohepatitis (NASH)    nitroGLYCERIN (NITROSTAT) 0.4 MG SL tablet Place 0.4 mg under the tongue every 5 (five) minutes as needed for chest pain (chest pain).    omeprazole (PRILOSEC) 20 MG capsule Take 1 capsule (20 mg total) by mouth daily. Qty: 30 capsule, Refills: 6     ranolazine (RANEXA) 500 MG 12 hr tablet Take 2 tablets (1,000 mg total) by mouth 2 (two) times daily. Qty: 60 tablet, Refills: 11   Associated Diagnoses: Coronary artery disease involving native coronary artery of native heart with angina pectoris (HCC)    rifaximin (XIFAXAN) 550 MG TABS tablet Take 1 tablet (550 mg total) by mouth 2 (two) times daily. Qty: 24 tablet, Refills: 0    spironolactone (ALDACTONE) 25 MG tablet Take 1 tablet (25 mg total) by mouth daily. Qty: 30 tablet, Refills: 0    sulfamethoxazole-trimethoprim (BACTRIM DS,SEPTRA DS) 800-160 MG tablet Take 1 tablet by mouth daily. Resume in 2 weeks after IV antibiotics have been completed.       Allergies  Allergen Reactions  . Isosorbide Other (See Comments)    Nose bleeds  . Lactose Intolerance (Gi) Other (See Comments)      The results of significant diagnostics from this hospitalization (including imaging, microbiology, ancillary and laboratory) are listed below for reference.    Significant Diagnostic Studies: Dg Orthopantogram  04/16/2015  CLINICAL DATA:  Poor dentition.  Bacteremia. EXAM: ORTHOPANTOGRAM/PANORAMIC COMPARISON:  None. FINDINGS: Patient is missing multiple teeth including the upper central incisors. There is concern for large caries involving the right upper lateral incisor. There may also be large caries involving the left upper bicuspid. The lower central incisors may be broken but the roots appear to be present. Concern for caries involving the left lower lateral incisor. The molars have been removed. There may be a small molar remnant on the upper left. Mandible is intact. There may be mild periapical lucency involving the remnants of the lower central incisors. IMPRESSION: Concern for multiple caries. Subtle periapical lucency involving the lower central incisors. Electronically Signed   By: Markus Daft M.D.   On: 04/16/2015 17:21   Dg Chest 2 View  05/02/2015  CLINICAL DATA:  Altered mental  status. History of healthcare acquired pneumonia. EXAM: CHEST  2 VIEW COMPARISON:  04/14/2015 FINDINGS: Tip of the right upper extremity PICC in the mid proximal SVC cardiomediastinal contours are unchanged, heart at the upper limits of normal in size. Improving pulmonary edema with persistent vascular congestion. No confluent airspace disease, pleural effusion or pneumothorax. Degenerative change in the thoracic spine. IMPRESSION: 1. No acute process. 2. Tip of the right upper extremity PICC in the SVC. Electronically Signed   By: Jeb Levering M.D.   On: 05/02/2015 20:08   Ct Head Wo Contrast  05/07/2015  CLINICAL DATA:  Altered mental status. Found unresponsive this morning. History of nonalcoholic cirrhosis and hepatic encephalopathy. EXAM: CT HEAD WITHOUT CONTRAST TECHNIQUE: Contiguous axial images were obtained from the base of the skull through the vertex without intravenous contrast. COMPARISON:  04/14/2015 FINDINGS: There is no evidence of acute cortical infarct, intracranial hemorrhage, mass, midline shift, or extra-axial fluid collection. Ventricles and sulci are normal. Patchy hypodensities in the subcortical deep cerebral white matter bilaterally are similar to the prior  study and nonspecific but compatible with mild chronic small vessel ischemic disease. Small, chronic right occipital lobe cortical infarct is unchanged. Prior right cataract extraction is noted. Paranasal sinuses and mastoid air cells are clear. Right maxillary sinus is small. Calcified atherosclerosis is noted at the skullbase. No skull fracture is identified. IMPRESSION: 1. No evidence of acute intracranial abnormality. 2. Chronic ischemic change. Electronically Signed   By: Logan Bores M.D.   On: 05/07/2015 12:04   Ct Head Wo Contrast  04/14/2015  CLINICAL DATA:  Acute onset of altered mental status. Fruity odor to breath. Personal history of elevated ammonia. Initial encounter. EXAM: CT HEAD WITHOUT CONTRAST TECHNIQUE:  Contiguous axial images were obtained from the base of the skull through the vertex without intravenous contrast. COMPARISON:  CT of the head performed 01/19/2015 FINDINGS: There is no evidence of acute infarction, mass lesion, or intra- or extra-axial hemorrhage on CT. Scattered periventricular and subcortical white matter change likely reflects small vessel ischemic microangiopathy. A small chronic infarct is noted at the right occipital lobe. The posterior fossa, including the cerebellum, brainstem and fourth ventricle, is within normal limits. The third and lateral ventricles, and basal ganglia are unremarkable in appearance. No mass effect or midline shift is seen. There is no evidence of fracture; visualized osseous structures are unremarkable in appearance. The orbits are within normal limits. The paranasal sinuses and mastoid air cells are well-aerated. No significant soft tissue abnormalities are seen. IMPRESSION: 1. No acute intracranial pathology seen on CT. 2. Scattered small vessel ischemic microangiopathy. Small chronic infarct at the right occipital lobe. Electronically Signed   By: Garald Balding M.D.   On: 04/14/2015 04:09   US Abdomen Complete  04/18/2015  CLINICAL DATA:  Acute onset of bacteremia. Ascites. Initial encounter. EXAM: ULTRASOUND ABDOMEN COMPLETE COMPARISON:  CT of the abdomen and pelvis performed 04/14/2015 FINDINGS: Gallbladder: A few stones are seen within the gallbladder. The gallbladder is otherwise unremarkable. No significant gallbladder wall thickening or pericholecystic fluid is seen. No ultrasonographic Murphy's sign is elicited. Common bile duct: Diameter: 0.7 cm, borderline prominent. Liver: No focal lesion identified. Within normal limits in parenchymal echogenicity. Difficult to fully characterize due to the patient's habitus. IVC: No abnormality visualized. Pancreas: Not visualized. Spleen: Size and appearance within normal limits. Right Kidney: Length: 12.2 cm.  Echogenicity within normal limits. No mass or hydronephrosis visualized. Left Kidney: Length: 11.6 cm. Echogenicity within normal limits. No mass or hydronephrosis visualized. Abdominal aorta: No aneurysm visualized. Other findings: None. IMPRESSION: 1. No acute abnormality seen within the abdomen. Evaluation suboptimal due to the patient's habitus. 2. Cholelithiasis. Gallbladder otherwise unremarkable. Slight prominence of the common bile duct likely remains within normal limits. Electronically Signed   By: Garald Balding M.D.   On: 04/18/2015 00:44   Dg Chest Port 1 View  05/07/2015  CLINICAL DATA:  Insert epic study EXAM: PORTABLE CHEST 1 VIEW COMPARISON:  05/02/2015 FINDINGS: Shallow lung inflation. Heart size is normal. There are no focal consolidations or pleural effusions. Right-sided PICC line tip overlies the level of the upper superior vena cava. IMPRESSION: Shallow lung inflation. No evidence for acute cardio pulmonary abnormality. Electronically Signed   By: Nolon Nations M.D.   On: 05/07/2015 13:24   Dg Chest Port 1 View  04/14/2015  CLINICAL DATA:  Acute onset of shortness of breath. Initial encounter. EXAM: PORTABLE CHEST 1 VIEW COMPARISON:  Chest radiograph performed 01/31/2015 FINDINGS: The lungs are hypoexpanded. Vascular congestion is noted, with increased interstitial markings, concerning for  mild pulmonary edema. There is no evidence of pleural effusion or pneumothorax. The cardiomediastinal silhouette is borderline normal in size. No acute osseous abnormalities are seen. IMPRESSION: Lungs hypoexpanded. Vascular congestion, with increased interstitial markings, concerning for mild pulmonary edema. Electronically Signed   By: Garald Balding M.D.   On: 04/14/2015 01:26   Ct Renal Stone Study  04/14/2015  CLINICAL DATA:  Altered mental status, first noticed around 20:30. EXAM: CT ABDOMEN AND PELVIS WITHOUT CONTRAST TECHNIQUE: Multidetector CT imaging of the abdomen and pelvis was  performed following the standard protocol without IV contrast. COMPARISON:  12/29/2013 FINDINGS: No acute finding is evident in the lower chest. The liver is cirrhotic. No focal liver lesion is evident on this unenhanced scan. There is no bile duct dilatation. There are numerous calculi within the gallbladder lumen. There are numerous very large varices, including very prominent varices around the distal esophagus and stomach. There is recanalization of the umbilical vein with caput medusa. There are numerous varices elsewhere in the abdomen and pelvis. The portal vein is enlarged and partially calcified. The calcification corresponds to the area of partial thrombosis observed on 12/29/2013. Patency of the portal vein cannot be established on this study. There is mild splenomegaly, probably slightly worsened. There is a very small volume of ascites collected dependently in the pelvis. There is a 2 x 3 mm lower pole left collecting system calculus. There is no hydronephrosis or ureteral dilatation. There is no bowel obstruction. There is no extraluminal air. There is hazy high attenuation in the mesentery which is worsened from 12/29/2013. This mesenteric edema could be related to portal hypertension. No significant skeletal lesion is evident. IMPRESSION: 1. Cirrhosis with portal hypertension. Very large varices. Patency of the portal vein is indeterminate. 2. Very small volume of ascites collected dependently in the pelvis, new. Worsened mesenteric edema. 3. Mild splenomegaly, probably mildly worsened. 4. Left nephrolithiasis. 5. No evidence of bowel obstruction or perforation. No focal inflammatory changes are evident. 6. Cholelithiasis Electronically Signed   By: Andreas Newport M.D.   On: 04/14/2015 04:19    Microbiology: Recent Results (from the past 240 hour(s))  Blood Culture (routine x 2)     Status: None   Collection Time: 05/02/15  7:49 PM  Result Value Ref Range Status   Specimen Description  BLOOD PICC LINE  Final   Special Requests BOTTLES DRAWN AEROBIC AND ANAEROBIC 4CC  Final   Culture NO GROWTH 5 DAYS  Final   Report Status 05/07/2015 FINAL  Final  Blood Culture (routine x 2)     Status: None   Collection Time: 05/02/15  7:49 PM  Result Value Ref Range Status   Specimen Description BLOOD LEFT HAND  Final   Special Requests BOTTLES DRAWN AEROBIC ONLY 5CC  Final   Culture NO GROWTH 5 DAYS  Final   Report Status 05/07/2015 FINAL  Final  Urine culture     Status: None   Collection Time: 05/02/15 11:01 PM  Result Value Ref Range Status   Specimen Description URINE, RANDOM  Final   Special Requests NONE  Final   Culture 5,000 COLONIES/mL INSIGNIFICANT GROWTH  Final   Report Status 05/04/2015 FINAL  Final  MRSA PCR Screening     Status: None   Collection Time: 05/07/15  2:30 PM  Result Value Ref Range Status   MRSA by PCR NEGATIVE NEGATIVE Final    Comment:        The GeneXpert MRSA Assay (FDA approved for NASAL specimens  only), is one component of a comprehensive MRSA colonization surveillance program. It is not intended to diagnose MRSA infection nor to guide or monitor treatment for MRSA infections.      Labs: Basic Metabolic Panel:  Recent Labs Lab 05/02/15 1945 05/07/15 0951 05/08/15 0340  NA 139 139 135  K 4.0 4.3 3.6  CL 106 105 103  CO2 25 25 25   GLUCOSE 150* 152* 112*  BUN 9 7 9   CREATININE 0.70 0.84 0.86  CALCIUM 9.1 9.3 8.7*   Liver Function Tests:  Recent Labs Lab 05/02/15 1945 05/07/15 0951 05/08/15 0340  AST 55* 77* 69*  ALT 35 40 39  ALKPHOS 111 139* 85  BILITOT 1.6* 1.4* 2.6*  PROT 6.5 6.3* 6.1*  ALBUMIN 2.6* 2.9* 2.8*    Recent Labs Lab 05/07/15 1333  LIPASE 57*    Recent Labs Lab 05/02/15 1945 05/07/15 1023 05/08/15 0340  AMMONIA 99* 229* 65*   CBC:  Recent Labs Lab 05/02/15 1945 05/07/15 0951 05/08/15 0340  WBC 4.6 3.6* 5.9  NEUTROABS 2.6 2.1  --   HGB 12.1* 13.8 12.9*  HCT 35.7* 39.9 37.6*  MCV  100.8* 100.0 99.7  PLT 66* 52* 60*   Cardiac Enzymes: No results for input(s): CKTOTAL, CKMB, CKMBINDEX, TROPONINI in the last 168 hours. BNP: BNP (last 3 results)  Recent Labs  10/14/14 2248  BNP 56.1    ProBNP (last 3 results)  Recent Labs  12/11/14 0913  PROBNP 38.0    CBG:  Recent Labs Lab 05/07/15 1948 05/08/15 0016 05/08/15 0314 05/08/15 0734 05/08/15 1103  GLUCAP 94 105* 99 103* 180*       Signed:  Velvet Bathe  [facility] 05/08/2015, 12:48 PM

## 2015-05-08 NOTE — Patient Outreach (Signed)
[facility]) Care Management  05/08/2015  Adam Butler 1955/11/18 [id number]   Late entry. Pt called me on Tuesday stating he had finished his IV antibiotic and wanted his PICC line discontinued. I asked him if he had his [place] number as that is who would be responsible for this action and he did not know or could not find the nurse's number. I also told him the prescribing MD would have to give the order to discontinue this. I told him I would reseach and find out the prescribing MD and send a message asking if the line can be discontinued since he is finished with his course of antibiotics. I did send that request to Dr. Irine Seal V and did note that the message was received.   [name] [facility] [place] [phone]

## 2015-05-08 NOTE — Evaluation (Signed)
Clinical/Bedside Swallow Evaluation Patient Details  Name: Adam Butler MRN: [id number] Date of Birth: Jan 28, 1956  Today's Date: 05/08/2015 Time: SLP Start Time (ACUTE ONLY): 0806 SLP Stop Time (ACUTE ONLY): 0815 SLP Time Calculation (min) (ACUTE ONLY): 9 min  Past Medical History:  Past Medical History  Diagnosis Date  . Cirrhosis ([place]) 2011    Cryptogenic, Likely NASH. Family/pt deny EtOH. HCV, HBV, HAV negative. ANA negative. AMA positive. Ascites 12/11  . Hyperlipemia   . Coronary artery disease     Inferior MI 12/11; LHC with occluded mid CFX and 80% proximal RCA. EF 55%. He had 3.0 x 28 vision BMS to CFX  . Diastolic CHF, acute ([place])     Echo 12/11 with ef 50-55% and mild LVH. EF 55% by LV0gram in 12/11  . Hypertension   . Type II diabetes mellitus ([place]) 2011  . SVT (supraventricular tachycardia) ([place])     1/12: appeared to be an ectopic atrial tachycardia. Required DCCV with hemodynamic instability  . GI bleed     12/11: Etiology not clearly defined. EGD: nonbleeding esophageal varices.  . S/P coronary artery stent placement 06/2010  . Esophageal varices ([place]) 2011, 2013    no hx acute variceal bleed  . Hepatic encephalopathy ([place]) 2011, 12/2013  . Myocardial infarction [facility]) ? 2012  . Shortness of breath dyspnea   . Arthritis   . Hx of echocardiogram     Echo 5/16:  EF 55-60%, no RWMA, mod LAE  . Thrombocytopenia ([place])   . Cholelithiasis   . Rectal varices   . PFO (patent foramen ovale): Per TEE 04/20/2015 04/20/2015  . Bacteremia    Past Surgical History:  Past Surgical History  Procedure Laterality Date  . Cardiac catheterization  07/01/2010    BMS to CFX.  Marland Kitchen Appendectomy    . Orif r leg    . Esophagogastroduodenoscopy  04/13/2012    Procedure: ESOPHAGOGASTRODUODENOSCOPY (EGD);  Surgeon: Inda Castle, MD;  Location: [facility] END[facility];  Service: Endoscopy;  Laterality: N/A;  . Colonoscopy  04/13/2012    Procedure: COLONOSCOPY;  Surgeon: Inda Castle, MD;   Location: [facility] ENDOSCOPY;  Service: Endoscopy;  Laterality: N/A;  . Esophagogastroduodenoscopy N/A 01/01/2014    Procedure: ESOPHAGOGASTRODUODENOSCOPY (EGD);  Surgeon: Jerene Bears, MD;  Location[facility] ENDOSCOPY;  Service: Endoscopy;  Laterality: N/A;  . Esophageal banding N/A 01/01/2014    Procedure: ESOPHAGEAL BANDING;  Surgeon: Jerene Bears, MD;  Location: [facility]COPY;  Service: Endoscopy;  Laterality: N/A;  . Coronary stent placement  06/30/2010    CFX   Distal        . Left heart catheterization with coronary angiogram N/A 10/15/2014    Procedure: LEFT HEART CATHETERIZATION WITH CORONARY ANGIOGRAM;  Surgeon: Peter M Martinique, MD;  Location: [facility] CATH LAB;  Service: Cardiovascular;  Laterality: N/A;  . Tee without cardioversion N/A 04/20/2015    Procedure: TRANSESOPHAGEAL ECHOCARDIOGRAM (TEE);  Surgeon: Fay Records, MD;  Location: [facility] ENDOSCOPY;  Service: Cardiovascular;  Laterality: N/A;   HPI:  Admitted with acute hepatic encephalopathy , history of cryptogenic/NASH cirrhosis esophageal varices and recurrent hepatic encephalopathy   Assessment / Plan / Recommendation Clinical Impression  Pt demonstrates normal swallow function. He is fully alert. Will initiate a regular consistency diet with thin liquids. No SLP f/u needed, will sign off.     Aspiration Risk  Mild    Diet Recommendation Age appropriate regular solids;Thin   Medication Administration: Whole meds with liquid    Other  Recommendations     Follow Up Recommendations       Frequency and Duration        Pertinent Vitals/Pain NA    SLP Swallow Goals     Swallow Study Prior Functional Status       General Other Pertinent Information: Admitted with acute hepatic encephalopathy , history of cryptogenic/NASH cirrhosis esophageal varices and recurrent hepatic encephalopathy Type of Study: Bedside swallow evaluation Previous Swallow Assessment: none Diet Prior to this Study: NPO Temperature Spikes Noted: No Respiratory  Status: Room air History of Recent Intubation: No Behavior/Cognition: Alert;Cooperative;Pleasant mood Oral Cavity - Dentition: Adequate natural dentition/normal for age Self-Feeding Abilities: Able to feed self Patient Positioning: Upright in chair/Tumbleform Baseline Vocal Quality: Normal Volitional Cough: Strong Volitional Swallow: Able to elicit    Oral/Motor/Sensory Function Overall Oral Motor/Sensory Function: Appears within functional limits for tasks assessed   Ice Chips     Thin Liquid Thin Liquid: Within functional limits    Nectar Thick     Honey Thick     Puree     Solid   GO    Solid: Within functional limits      Herbie [name], MA CCC-SLP [phone]  Megin Consalvo, Katherene Ponto 05/08/2015,8:22 AM

## 2015-05-08 NOTE — Progress Notes (Signed)
Patient discharge instructions given and explained to patient and patient's wife. All questions answered. Peggy, NT to discharge patient via wheelchair.

## 2015-05-09 NOTE — Care Management (Signed)
CM spoke to pt regarding resources for dental assistance however unable to provide pt with any immediate resources. Pt states doesn't have dental insurance and can't afford the expense of dental work. CM to f/u with pt. Whitman Hero RN,BSN,CM [phone]

## 2015-05-10 NOTE — Progress Notes (Signed)
Probably nothing further to do with that PFO for right now.

## 2015-05-11 ENCOUNTER — Ambulatory Visit (INDEPENDENT_AMBULATORY_CARE_PROVIDER_SITE_OTHER): Payer: PPO | Admitting: Primary Care

## 2015-05-11 ENCOUNTER — Telehealth: Payer: Self-pay | Admitting: Internal Medicine

## 2015-05-11 ENCOUNTER — Telehealth: Payer: Self-pay | Admitting: Primary Care

## 2015-05-11 ENCOUNTER — Telehealth: Payer: Self-pay | Admitting: *Deleted

## 2015-05-11 ENCOUNTER — Encounter: Payer: Self-pay | Admitting: Primary Care

## 2015-05-11 VITALS — BP 130/72 | HR 60 | Temp 97.4°F | Ht 67.0 in | Wt 276.8 lb

## 2015-05-11 DIAGNOSIS — K7290 Hepatic failure, unspecified without coma: Secondary | ICD-10-CM | POA: Diagnosis not present

## 2015-05-11 DIAGNOSIS — K7682 Hepatic encephalopathy: Secondary | ICD-10-CM

## 2015-05-11 NOTE — Telephone Encounter (Signed)
Pt is asking when his letter of dismissal for jury duty will be ready? Pt states he gave it to [place] at his last visit on 05/06/15.   [phone]

## 2015-05-11 NOTE — Progress Notes (Signed)
Subjective:    Patient ID: Adam Butler, male    DOB: 19-Feb-1956, 59 y.o.   MRN: [id number]  HPI  Adam Butler is a 59 year old male who presents today for hospital follow up. He was reecntly discharged on 04/20/15 with sepsis related to healthcare aquired pneumonia. He presented with his wife to the emergency department on 10/27 with altered mental status. He had difficulty recognizing familier objects earlier that day and was found by his wife unresponsive later. He has a history of altered mental status in the past when his amonia level becomes elevated. He underwent numerous testing, labs, and imaging during hospitalization. In the emergency department his head CT scan was normal and his amonia level was elevated at 229. He was diagnosed with hepatic encephalopathy, admitted, evaluated by infectious disease and cardiology, and treated with rifaximin and lactulose. He was discharged home on 10/28 with ammonia level at 65.  Since his discharge he's feeling well. He's scheduled to see his gasteroenterologist November 8th. He's continued to take his lactulose but not as prescribed. He's supposed to take 30 grams three times daily, but has been taking 30 grams twice daily. Denies abdominal pain, chest pain, constipation. He found out that he was lactose intolerant in the hospital and has been staying away from dairy. His normal ammonia levels typically run higher at baseline per patient. He has no complaints, questions, concerns.  Review of Systems  Constitutional: Negative for fever and chills.  Respiratory: Negative for shortness of breath.   Cardiovascular: Negative for chest pain.  Gastrointestinal: Negative for abdominal pain, constipation and blood in stool.  Neurological: Negative for dizziness and light-headedness.       Past Medical History  Diagnosis Date  . Cirrhosis ([place]) 2011    Cryptogenic, Likely NASH. Family/pt deny EtOH. HCV, HBV, HAV negative. ANA negative. AMA  positive. Ascites 12/11  . Hyperlipemia   . Coronary artery disease     Inferior MI 12/11; LHC with occluded mid CFX and 80% proximal RCA. EF 55%. He had 3.0 x 28 vision BMS to CFX  . Diastolic CHF, acute ([place])     Echo 12/11 with ef 50-55% and mild LVH. EF 55% by LV0gram in 12/11  . Hypertension   . Type II diabetes mellitus ([place]) 2011  . SVT (supraventricular tachycardia) ([place])     1/12: appeared to be an ectopic atrial tachycardia. Required DCCV with hemodynamic instability  . GI bleed     12/11: Etiology not clearly defined. EGD: nonbleeding esophageal varices.  . S/P coronary artery stent placement 06/2010  . Esophageal varices ([place]) 2011, 2013    no hx acute variceal bleed  . Hepatic encephalopathy ([place]) 2011, 12/2013  . Myocardial infarction [facility]) ? 2012  . Shortness of breath dyspnea   . Arthritis   . Hx of echocardiogram     Echo 5/16:  EF 55-60%, no RWMA, mod LAE  . Thrombocytopenia ([place])   . Cholelithiasis   . Rectal varices   . PFO (patent foramen ovale): Per TEE 04/20/2015 04/20/2015  . Bacteremia     Social History   Social History  . Marital Status: Married    Spouse Name: N/A  . Number of Children: 3  . Years of Education: N/A   Occupational History  . Unemployed Other    Worked in maintenance prior   Social History Main Topics  . Smoking status: Current Every Day Smoker -- 1.00 packs/day for 36 years    Types: E-cigarettes  .  Smokeless tobacco: Current User     Comment: uses vapor cigarettes (2016 )  . Alcohol Use: No  . Drug Use: No  . Sexual Activity: No   Other Topics Concern  . Not on file   Social History Narrative   Married   Gets regular exercise: walking    Past Surgical History  Procedure Laterality Date  . Cardiac catheterization  07/01/2010    BMS to CFX.  Marland Kitchen Appendectomy    . Orif r leg    . Esophagogastroduodenoscopy  04/13/2012    Procedure: ESOPHAGOGASTRODUODENOSCOPY (EGD);  Surgeon: Inda Castle, MD;  [facility];  Service: Endoscopy;  Laterality: N/A;  . Colonoscopy  04/13/2012    Procedure: COLONOSCOPY;  Surgeon: Inda Castle, MD;  Location: [facility] ENDOSCOPY;  Service: Endoscopy;  Laterality: N/A;  . Esophagogastroduodenoscopy N/A 01/01/2014    Procedure: ESOPHAGOGASTRODUODENOSCOPY (EGD);  Surgeon: Jerene Bears, MD;  Location: [facility] ENDOSCOPY;  Service: Endoscopy;  Laterality: N/A;  . Esophageal banding N/A 01/01/2014    Procedure: ESOPHAGEAL BANDING;  Surgeon: Jerene Bears, MD;  Location: [facility] ENDOSCOPY;  Service: Endoscopy;  Laterality: N/A;  . Coronary stent placement  06/30/2010    CFX   Distal        . Left heart catheterization with coronary angiogram N/A 10/15/2014    Procedure: LEFT HEART CATHETERIZATION WITH CORONARY ANGIOGRAM;  Surgeon: Peter M Martinique, MD;  Location: [facility] CATH LAB;  Service: Cardiovascular;  Laterality: N/A;  . Tee without cardioversion N/A 04/20/2015    Procedure: TRANSESOPHAGEAL ECHOCARDIOGRAM (TEE);  Surgeon: Fay Records, MD;  Location: [facility] ENDOSCOPY;  Service: Cardiovascular;  Laterality: N/A;    Family History  Problem Relation Age of Onset  . Heart attack Brother 13    MI  . Diabetes Brother   . Heart attack Father   . Diabetes Father   . COPD Father   . COPD Mother   . Stroke Neg Hx   . Heart attack Brother     Allergies  Allergen Reactions  . Isosorbide Other (See Comments)    Nose bleeds  . Lactose Intolerance (Gi) Other (See Comments)    Current Outpatient Prescriptions on File Prior to Visit  Medication Sig Dispense Refill  . aspirin 81 MG tablet Take 81 mg by mouth daily.     Marland Kitchen atorvastatin (LIPITOR) 10 MG tablet Take 10 mg by mouth daily at 6 PM.    . furosemide (LASIX) 40 MG tablet Take 40 mg by mouth daily.     . insulin NPH Human (HUMULIN N,NOVOLIN N) 100 UNIT/ML injection Inject 0.35-0.8 mLs (35-80 Units total) into the skin See admin instructions. Take 80 units in the morning and 35 units in the evening. 10 mL 0  . lactulose (CHRONULAC) 10  GM/15ML solution Take 30 g by mouth 3 (three) times daily. May take an additional dose as needed for bowel movement    . metFORMIN (GLUCOPHAGE) 1000 MG tablet Take 1,000 mg by mouth 2 (two) times daily.  11  . Multiple Vitamin (MULTIVITAMIN) capsule Take 1 capsule by mouth daily.      . nadolol (CORGARD) 40 MG tablet Take 1 tablet (40 mg total) by mouth daily. 30 tablet 5  . nitroGLYCERIN (NITROSTAT) 0.4 MG SL tablet Place 0.4 mg under the tongue every 5 (five) minutes as needed for chest pain (chest pain).    Marland Kitchen omeprazole (PRILOSEC) 20 MG capsule Take 1 capsule (20 mg total) by mouth daily. 30 capsule 6  .  ranolazine (RANEXA) 500 MG 12 hr tablet Take 2 tablets (1,000 mg total) by mouth 2 (two) times daily. 60 tablet 11  . rifaximin (XIFAXAN) 550 MG TABS tablet Take 1 tablet (550 mg total) by mouth 2 (two) times daily. 24 tablet 0  . spironolactone (ALDACTONE) 25 MG tablet Take 1 tablet (25 mg total) by mouth daily. 30 tablet 0  . sulfamethoxazole-trimethoprim (BACTRIM DS,SEPTRA DS) 800-160 MG tablet Take 1 tablet by mouth daily. Resume in 2 weeks after IV antibiotics have been completed.     No current facility-administered medications on file prior to visit.    BP 130/72 mmHg  Pulse 60  Temp(Src) 97.4 F (36.3 C) (Oral)  Ht 5' 7"  (1.702 m)  Wt 276 lb 12.8 oz (125.556 kg)  BMI 43.34 kg/m2  SpO2 98%    Objective:   Physical Exam  Constitutional: He is oriented to person, place, and time. He appears well-nourished.  Cardiovascular: Normal rate and regular rhythm.   Pulmonary/Chest: Effort normal and breath sounds normal.  Abdominal: Soft. Bowel sounds are normal. There is no tenderness.  Neurological: He is alert and oriented to person, place, and time.  Skin: Skin is warm and dry.  Psychiatric: He has a normal mood and affect.          Assessment & Plan:  Hospital Follow up:  Admitted 10/27 for altered mental status caused by elevation in Ammonia levels. Treated with  lactulose and rifaximin. Discharged home on 10/28 with ammonia level returning to baseline. Discussed the importance of taking his lactulose as prescribed. He has follow up with GI on 11/8. Exam unremarkable, alert and oriented x 4. Repeat ammonia level obtained in clinic and is pending. Discussed return precautions.

## 2015-05-11 NOTE — Patient Instructions (Signed)
Complete lab work prior to leaving today. I will notify you of your results.  Follow up with GI as scheduled on November 8th.  I recommend you take the Lactulose three times daily as directed.  Please call us if you develop abdominal pain, fevers, confusion.  It was a pleasure meeting you!

## 2015-05-11 NOTE — Telephone Encounter (Signed)
Transition Care Management Follow-up Telephone Call   Date discharged? 05/08/15   How have you been since you were released from the hospital? Doing very well   Do you understand why you were in the hospital? yes   Do you understand the discharge instructions? yes   Where were you discharged to? Home   Items Reviewed:  Medications reviewed: yes  Allergies reviewed: yes  Dietary changes reviewed: yes, lactose free  Referrals reviewed: yes   Functional Questionnaire:   Activities of Daily Living (ADLs):   He states they are independent in the following: ambulation, bathing and hygiene, feeding, continence, grooming, toileting and dressing States they require assistance with the following: None   Any transportation issues/concerns?: no   Any patient concerns? no   Confirmed importance and date/time of follow-up visits scheduled yes, 05/11/15 @ 1315  Provider Appointment booked with Allie Bossier, NP  Confirmed with patient if condition begins to worsen call PCP or go to the ER.  Patient was given the office number and encouraged to call back with question or concerns.  : yes

## 2015-05-11 NOTE — Progress Notes (Signed)
Pre visit review using our clinic review tool, if applicable. No additional management support is needed unless otherwise documented below in the visit note. 

## 2015-05-11 NOTE — Telephone Encounter (Signed)
Noted. Will discuss this afternoon.

## 2015-05-12 ENCOUNTER — Other Ambulatory Visit: Payer: Self-pay | Admitting: *Deleted

## 2015-05-12 ENCOUNTER — Other Ambulatory Visit: Payer: Self-pay | Admitting: Cardiology

## 2015-05-12 LAB — AMMONIA: AMMONIA: 157 umol/L — AB (ref 16–53)

## 2015-05-12 NOTE — Telephone Encounter (Signed)
Letter done and placed in front office for pt to pick up and he is aware

## 2015-05-12 NOTE — Patient Outreach (Signed)
Pt came home from the hospital on Friday. He saw his primary care MD yesterday. He is feeling pretty good. He is taking his lactulose 30 cc tid. He says it really makes his bowels loose. His PICC line was removed in the hosptial. He will not have home health now. He agrees to a home visit from me on Thursday.  [name] [facility] [place] [phone]

## 2015-05-14 ENCOUNTER — Inpatient Hospital Stay (HOSPITAL_COMMUNITY)
Admission: EM | Admit: 2015-05-14 | Discharge: 2015-05-17 | DRG: 442 | Disposition: A | Discharge status: Home / Self Care | Payer: PPO | Attending: Family Medicine | Admitting: Family Medicine

## 2015-05-14 ENCOUNTER — Encounter (HOSPITAL_COMMUNITY): Payer: Self-pay | Admitting: *Deleted

## 2015-05-14 ENCOUNTER — Other Ambulatory Visit: Payer: Self-pay | Admitting: *Deleted

## 2015-05-14 DIAGNOSIS — E876 Hypokalemia: Secondary | ICD-10-CM | POA: Diagnosis present

## 2015-05-14 DIAGNOSIS — K7682 Hepatic encephalopathy: Secondary | ICD-10-CM | POA: Diagnosis present

## 2015-05-14 DIAGNOSIS — Q2112 Patent foramen ovale: Secondary | ICD-10-CM

## 2015-05-14 DIAGNOSIS — Z888 Allergy status to other drugs, medicaments and biological substances status: Secondary | ICD-10-CM | POA: Diagnosis not present

## 2015-05-14 DIAGNOSIS — Z8249 Family history of ischemic heart disease and other diseases of the circulatory system: Secondary | ICD-10-CM

## 2015-05-14 DIAGNOSIS — Z7982 Long term (current) use of aspirin: Secondary | ICD-10-CM

## 2015-05-14 DIAGNOSIS — Z79899 Other long term (current) drug therapy: Secondary | ICD-10-CM | POA: Diagnosis not present

## 2015-05-14 DIAGNOSIS — Z955 Presence of coronary angioplasty implant and graft: Secondary | ICD-10-CM | POA: Diagnosis not present

## 2015-05-14 DIAGNOSIS — I252 Old myocardial infarction: Secondary | ICD-10-CM

## 2015-05-14 DIAGNOSIS — I110 Hypertensive heart disease with heart failure: Secondary | ICD-10-CM | POA: Diagnosis present

## 2015-05-14 DIAGNOSIS — D696 Thrombocytopenia, unspecified: Secondary | ICD-10-CM | POA: Diagnosis present

## 2015-05-14 DIAGNOSIS — I5032 Chronic diastolic (congestive) heart failure: Secondary | ICD-10-CM | POA: Diagnosis present

## 2015-05-14 DIAGNOSIS — E785 Hyperlipidemia, unspecified: Secondary | ICD-10-CM | POA: Diagnosis present

## 2015-05-14 DIAGNOSIS — F17210 Nicotine dependence, cigarettes, uncomplicated: Secondary | ICD-10-CM | POA: Diagnosis present

## 2015-05-14 DIAGNOSIS — Z7984 Long term (current) use of oral hypoglycemic drugs: Secondary | ICD-10-CM | POA: Diagnosis not present

## 2015-05-14 DIAGNOSIS — R5081 Fever presenting with conditions classified elsewhere: Secondary | ICD-10-CM | POA: Diagnosis not present

## 2015-05-14 DIAGNOSIS — E1165 Type 2 diabetes mellitus with hyperglycemia: Secondary | ICD-10-CM | POA: Diagnosis present

## 2015-05-14 DIAGNOSIS — Z825 Family history of asthma and other chronic lower respiratory diseases: Secondary | ICD-10-CM | POA: Diagnosis not present

## 2015-05-14 DIAGNOSIS — E739 Lactose intolerance, unspecified: Secondary | ICD-10-CM | POA: Diagnosis present

## 2015-05-14 DIAGNOSIS — Z833 Family history of diabetes mellitus: Secondary | ICD-10-CM | POA: Diagnosis not present

## 2015-05-14 DIAGNOSIS — Q211 Atrial septal defect: Secondary | ICD-10-CM | POA: Diagnosis not present

## 2015-05-14 DIAGNOSIS — M1990 Unspecified osteoarthritis, unspecified site: Secondary | ICD-10-CM | POA: Diagnosis present

## 2015-05-14 DIAGNOSIS — K7200 Acute and subacute hepatic failure without coma: Secondary | ICD-10-CM | POA: Diagnosis not present

## 2015-05-14 DIAGNOSIS — R4182 Altered mental status, unspecified: Secondary | ICD-10-CM

## 2015-05-14 DIAGNOSIS — R509 Fever, unspecified: Secondary | ICD-10-CM | POA: Insufficient documentation

## 2015-05-14 DIAGNOSIS — Z794 Long term (current) use of insulin: Secondary | ICD-10-CM | POA: Diagnosis not present

## 2015-05-14 DIAGNOSIS — I2510 Atherosclerotic heart disease of native coronary artery without angina pectoris: Secondary | ICD-10-CM | POA: Diagnosis present

## 2015-05-14 DIAGNOSIS — K7469 Other cirrhosis of liver: Secondary | ICD-10-CM | POA: Diagnosis present

## 2015-05-14 DIAGNOSIS — IMO0002 Reserved for concepts with insufficient information to code with codable children: Secondary | ICD-10-CM | POA: Diagnosis present

## 2015-05-14 DIAGNOSIS — K7290 Hepatic failure, unspecified without coma: Secondary | ICD-10-CM | POA: Diagnosis present

## 2015-05-14 DIAGNOSIS — K7581 Nonalcoholic steatohepatitis (NASH): Secondary | ICD-10-CM | POA: Diagnosis present

## 2015-05-14 LAB — CBC WITH DIFFERENTIAL/PLATELET
BASOS ABS: 0 10*3/uL (ref 0.0–0.1)
BASOS PCT: 1 %
EOS ABS: 0.1 10*3/uL (ref 0.0–0.7)
Eosinophils Relative: 1 %
HCT: 41 % (ref 39.0–52.0)
HEMOGLOBIN: 14.1 g/dL (ref 13.0–17.0)
LYMPHS ABS: 0.7 10*3/uL (ref 0.7–4.0)
Lymphocytes Relative: 12 %
MCH: 34.9 pg — AB (ref 26.0–34.0)
MCHC: 34.4 g/dL (ref 30.0–36.0)
MCV: 101.5 fL — ABNORMAL HIGH (ref 78.0–100.0)
Monocytes Absolute: 0.7 10*3/uL (ref 0.1–1.0)
Monocytes Relative: 12 %
NEUTROS PCT: 74 %
Neutro Abs: 4.2 10*3/uL (ref 1.7–7.7)
PLATELETS: 63 10*3/uL — AB (ref 150–400)
RBC: 4.04 MIL/uL — AB (ref 4.22–5.81)
RDW: 13.7 % (ref 11.5–15.5)
WBC: 5.6 10*3/uL (ref 4.0–10.5)

## 2015-05-14 LAB — COMPREHENSIVE METABOLIC PANEL
ALBUMIN: 3.2 g/dL — AB (ref 3.5–5.0)
ALK PHOS: 131 U/L — AB (ref 38–126)
ALT: 45 U/L (ref 17–63)
AST: 72 U/L — AB (ref 15–41)
Anion gap: 7 (ref 5–15)
BUN: 13 mg/dL (ref 6–20)
CALCIUM: 8.9 mg/dL (ref 8.9–10.3)
CHLORIDE: 107 mmol/L (ref 101–111)
CO2: 25 mmol/L (ref 22–32)
CREATININE: 0.89 mg/dL (ref 0.61–1.24)
GFR calc Af Amer: >60 mL/min (ref >60)
GFR calc non Af Amer: >60 mL/min (ref >60)
GLUCOSE: 134 mg/dL — AB (ref 65–99)
Potassium: 4.4 mmol/L (ref 3.5–5.1)
SODIUM: 139 mmol/L (ref 135–145)
Total Bilirubin: 1.8 mg/dL — ABNORMAL HIGH (ref 0.3–1.2)
Total Protein: 7 g/dL (ref 6.5–8.1)

## 2015-05-14 LAB — LACTIC ACID, PLASMA: Lactic Acid, Venous: 2.6 mmol/L (ref 0.5–2.0)

## 2015-05-14 LAB — URINALYSIS, ROUTINE W REFLEX MICROSCOPIC
Bilirubin Urine: NEGATIVE
GLUCOSE, UA: NEGATIVE mg/dL
HGB URINE DIPSTICK: NEGATIVE
Ketones, ur: NEGATIVE mg/dL
LEUKOCYTES UA: NEGATIVE
Nitrite: NEGATIVE
PROTEIN: NEGATIVE mg/dL
SPECIFIC GRAVITY, URINE: 1.027 (ref 1.005–1.030)
Urobilinogen, UA: 0.2 mg/dL (ref 0.0–1.0)
pH: 7.5 (ref 5.0–8.0)

## 2015-05-14 LAB — AMMONIA: AMMONIA: 122 umol/L — AB (ref 9–35)

## 2015-05-14 MED ORDER — LACTULOSE 10 GM/15ML PO SOLN
30.0000 g | Freq: Once | ORAL | Status: AC
  Administered 2015-05-14: 30 g via ORAL
  Filled 2015-05-14: qty 45

## 2015-05-14 MED ORDER — SODIUM CHLORIDE 0.9 % IV BOLUS (SEPSIS)
1000.0000 mL | Freq: Once | INTRAVENOUS | Status: AC
  Administered 2015-05-14: 1000 mL via INTRAVENOUS

## 2015-05-14 NOTE — H&P (Signed)
[facility] History and Physical  [name] IRS:[id number] DOB: [birth year] DOA: 05/14/2015  Referring physician: Merrily Pew, MD PCP: [name], NP   Chief Complaint: Altered Mental Status  HPI: Adam Butler is a 59 y.o. male with a past medical history of cryptogenic liver cirrhosis, hyperlipidemia, CAD, diastolic CHF, hypertension, type 2 diabetes, SVT, with multiple episodes of hepatic encephalopathy secondary to hyperammonemia who is brought to the emergency department after his wife has noticed a 1 day history of worsening mental status, decreased alertness and confusion, which she describes as usual symptoms of hepatic encephalopathy for this patient. She denies witnessing fever, dyspnea, nausea or emesis, diarrhea or urinary symptoms. She is states that recently his lactulose was increased to 3 times a day and the patient last bowel movement was yesterday. At this time, he responds to light heart and answers all questions when asked with the same response. He is otherwise in no acute distress.   Review of Systems:  Unable to evaluate due altered mental status.  Past Medical History  Diagnosis Date  . Cirrhosis ([place]) 2011    Cryptogenic, Likely NASH. Family/pt deny EtOH. HCV, HBV, HAV negative. ANA negative. AMA positive. Ascites 12/11  . Hyperlipemia   . Coronary artery disease     Inferior MI 12/11; LHC with occluded mid CFX and 80% proximal RCA. EF 55%. He had 3.0 x 28 vision BMS to CFX  . Diastolic CHF, acute ([place])     Echo 12/11 with ef 50-55% and mild LVH. EF 55% by LV0gram in 12/11  . Hypertension   . Type II diabetes mellitus ([place]) 2011  . SVT (supraventricular tachycardia) ([place])     1/12: appeared to be an ectopic atrial tachycardia. Required DCCV with hemodynamic instability  . GI bleed     12/11: Etiology not clearly defined. EGD: nonbleeding esophageal varices.  . S/P coronary artery stent placement 06/2010  . Esophageal varices ([place]) 2011,  2013    no hx acute variceal bleed  . Hepatic encephalopathy ([place]) 2011, 12/2013  . Myocardial infarction [facility]) ? 2012  . Shortness of breath dyspnea   . Arthritis   . Hx of echocardiogram     Echo 5/16:  EF 55-60%, no RWMA, mod LAE  . Thrombocytopenia ([place])   . Cholelithiasis   . Rectal varices   . PFO (patent foramen ovale): Per TEE 04/20/2015 04/20/2015  . Bacteremia    Past Surgical History  Procedure Laterality Date  . Cardiac catheterization  07/01/2010    BMS to CFX.  Marland Kitchen Appendectomy    . Orif r leg    . Esophagogastroduodenoscopy  04/13/2012    Procedure: ESOPHAGOGASTRODUODENOSCOPY (EGD);  Surgeon: Inda Castle, MD;  Location[facility];  Service: Endoscopy;  Laterality: N/A;  . Colonoscopy  04/13/2012    Procedure: COLONOSCOPY;  Surgeon: Inda Castle, MD;  Location: [facility] ENDOSCOPY;  Service: Endoscopy;  Laterality: N/A;  . Esophagogastroduodenoscopy N/A 01/01/2014    Procedure: ESOPHAGOGASTRODUODENOSCOPY (EGD);  Surgeon: Jerene Bears, MD;  Location: [facility] ENDOSCOPY;  Service: Endoscopy;  Laterality: N/A;  . Esophageal banding N/A 01/01/2014    Procedure: ESOPHAGEAL BANDING;  Surgeon: Jerene Bears, MD;  [facility] ENDOSCOPY;  Service: Endoscopy;  Laterality: N/A;  . Coronary stent placement  06/30/2010    CFX   Distal        . Left heart catheterization with coronary angiogram N/A 10/15/2014    Procedure: LEFT HEART CATHETERIZATION WITH CORONARY ANGIOGRAM;  Surgeon: Ander Slade  Martinique, MD;  Location: [facility] CATH LAB;  Service: Cardiovascular;  Laterality: N/A;  . Tee without cardioversion N/A 04/20/2015    Procedure: TRANSESOPHAGEAL ECHOCARDIOGRAM (TEE);  Surgeon: Fay Records, MD;  Location: [facility] ENDOSCOPY;  Service: Cardiovascular;  Laterality: N/A;   Social History:  reports that he has been smoking E-cigarettes.  He has a 36 pack-year smoking history. He uses smokeless tobacco. He reports that he does not drink alcohol or use illicit drugs.  Allergies  Allergen Reactions  .  Isosorbide Other (See Comments)    Nose bleeds  . Lactose Intolerance (Gi) Other (See Comments)    Family History  Problem Relation Age of Onset  . Heart attack Brother 47    MI  . Diabetes Brother   . Heart attack Father   . Diabetes Father   . COPD Father   . COPD Mother   . Stroke Neg Hx   . Heart attack Brother     Prior to Admission medications   Medication Sig Start Date End Date Taking? Authorizing Provider  aspirin 81 MG tablet Take 81 mg by mouth daily.    Yes Historical Provider, MD  atorvastatin (LIPITOR) 10 MG tablet Take 10 mg by mouth daily at 6 PM.   Yes Historical Provider, MD  furosemide (LASIX) 40 MG tablet Take 40 mg by mouth daily.  01/09/12  Yes Larey Dresser, MD  insulin NPH Human (HUMULIN N,NOVOLIN N) 100 UNIT/ML injection Inject 0.35-0.8 mLs (35-80 Units total) into the skin See admin instructions. Take 80 units in the morning and 35 units in the evening. 04/20/15  Yes Eugenie Filler, MD  lactulose (CHRONULAC) 10 GM/15ML solution Take 30 g by mouth 3 (three) times daily. May take an additional dose as needed for bowel movement   Yes Historical Provider, MD  metFORMIN (GLUCOPHAGE) 1000 MG tablet Take 1,000 mg by mouth 2 (two) times daily. 11/27/14  Yes Historical Provider, MD  Multiple Vitamin (MULTIVITAMIN) capsule Take 1 capsule by mouth daily.     Yes Historical Provider, MD  nadolol (CORGARD) 40 MG tablet Take 1 tablet (40 mg total) by mouth daily. 01/01/15  Yes Jerene Bears, MD  nitroGLYCERIN (NITROSTAT) 0.4 MG SL tablet Place 0.4 mg under the tongue every 5 (five) minutes as needed for chest pain (chest pain).   Yes Historical Provider, MD  omeprazole (PRILOSEC) 20 MG capsule Take 1 capsule (20 mg total) by mouth daily. 01/15/15  Yes Larey Dresser, MD  ranolazine (RANEXA) 500 MG 12 hr tablet Take 2 tablets (1,000 mg total) by mouth 2 (two) times daily. 01/15/15  Yes Larey Dresser, MD  rifaximin (XIFAXAN) 550 MG TABS tablet Take 1 tablet (550 mg total) by  mouth 2 (two) times daily. 02/13/15  Yes Jerene Bears, MD  spironolactone (ALDACTONE) 25 MG tablet TAKE 1 TABLET EVERY DAY 05/13/15   Larey Dresser, MD  sulfamethoxazole-trimethoprim (BACTRIM DS,SEPTRA DS) 800-160 MG tablet Take 1 tablet by mouth daily. Resume in 2 weeks after IV antibiotics have been completed. Patient taking differently: Take 1 tablet by mouth daily.  05/04/15   Eugenie Filler, MD   Physical Exam: Filed Vitals:   05/14/15 1930 05/14/15 2000 05/14/15 2030 05/14/15 2100  BP: 148/98 155/67 146/62 136/62  Pulse: 87 91 95 96  Temp:      TempSrc:      Resp: 26 27 26 26   SpO2: 97% 97% 96% 97%    Wt Readings from Last 3 Encounters:  05/11/15 125.556 kg (276 lb 12.8 oz)  05/07/15 124.9 kg (275 lb 5.7 oz)  05/06/15 126.554 kg (279 lb)    General:  Obtunded. Eyes: PERRL, normal lids, irises & conjunctiva ENT: grossly normal hearing, lips & oral mucosa are mildly dry Neck: no LAD, masses or thyromegaly Cardiovascular: RRR, no m/r/g. No LE edema. Telemetry: SR, no arrhythmias  Respiratory: CTA bilaterally anterior exam, no w/r/r. Normal respiratory effort. Abdomen: Mildly distended, bowel sounds positive, soft, ntnd Skin: Positive ecchymoses Musculoskeletal: grossly normal tone BUE/BLE Psychiatric: Obtunded Neurologic: Responds to light touch and answers simple questions with the same response each time.           Labs on Admission:  Basic Metabolic Panel:  Recent Labs Lab 05/08/15 0340 05/14/15 1603  NA 135 139  K 3.6 4.4  CL 103 107  CO2 25 25  GLUCOSE 112* 134*  BUN 9 13  CREATININE 0.86 0.89  CALCIUM 8.7* 8.9   Liver Function Tests:  Recent Labs Lab 05/08/15 0340 05/14/15 1603  AST 69* 72*  ALT 39 45  ALKPHOS 85 131*  BILITOT 2.6* 1.8*  PROT 6.1* 7.0  ALBUMIN 2.8* 3.2*    Recent Labs Lab 05/08/15 0340 05/14/15 1603  AMMONIA 65* 122*   CBC:  Recent Labs Lab 05/08/15 0340 05/14/15 1603  WBC 5.9 5.6  NEUTROABS  --  4.2  HGB  12.9* 14.1  HCT 37.6* 41.0  MCV 99.7 101.5*  PLT 60* 63*    BNP (last 3 results)  Recent Labs  10/14/14 2248  BNP 56.1    ProBNP (last 3 results)  Recent Labs  12/11/14 0913  PROBNP 38.0    CBG:  Recent Labs Lab 05/08/15 0016 05/08/15 0314 05/08/15 0734 05/08/15 1103 05/08/15 1701  GLUCAP 105* 99 103* 180* 148*     EKG: Independently reviewed. Vent. rate 81 BPM PR interval 147 ms QRS duration 94 ms QT/QTc 436/506 ms P-R-T axes 67 -23 10 Sinus rhythm Inferolateral infarct, old Prolonged QT interval  TEE 10/16 ------------------------------------------------------------------- Left ventricle: LVEF is normal.  ------------------------------------------------------------------- Aortic valve: AV is normal No AI.  ------------------------------------------------------------------- Aorta: Mild fixed plaquing of the thoracic aorta.  ------------------------------------------------------------------- Mitral valve: MV is normal Trace MR.  ------------------------------------------------------------------- Atrial septum: Large PFO present as tested by injection of agitated saline .  ------------------------------------------------------------------- Pulmonic valve:  PV is normal  ------------------------------------------------------------------- Tricuspid valve: TV is normal Mild to mod TR.  ------------------------------------------------------------------- Post procedure conclusions Ascending Aorta:  - Mild fixed plaquing of the thoracic aorta.  Assessment/Plan Principal Problem:   Hepatic encephalopathy (HCC)   Cryptogenic cirrhosis (HCC) Admit to a stepdown for closer monitoring. Continue lactulose and Xifaxan. Follow-up ammonia level in the morning.  Active Problems:   CAD, NATIVE VESSEL   DIASTOLIC HEART FAILURE, CHRONIC   PFO (patent foramen ovale): Per TEE 04/20/2015 The patient was recently treated for suspected  endocarditis, but had a negative for vegetations TEE and received 3 weeks of IV antibiotics per ID last consult note. As symptomatic at this time. Continue current management.    Type 2 diabetes mellitus, uncontrolled (HCC) Resume oral medications and insulin once the patient is back on carb modified diet. Monitor CBG.    Thrombocytopenia ([place]) No signs of bleeding at this time. Monitor platelets regularly.    Code Status: Full code. DVT Prophylaxis: Mechanical due to thrombocytopenia and risk of bleeding. Family Communication:  Disposition Plan: Admit to the stepdown for close monitoring. Continue lactulose and Xifaxan.  Time spent: Over 90 minutes  were spent during the process of this admission including, but not limited to chart review, direct patient contact, admission orders, documentation and coordination of care with medical and nursing staff.  Reubin Milan [facility] Pager [phone].

## 2015-05-14 NOTE — Progress Notes (Signed)
CRITICAL VALUE ALERT  Critical value received: lactic 2.6  Date of notification:  05/14/15  Time of notification:  2302  Critical value read back:Yes.    Nurse who received alert:  Darrin Nipper, RN  MD notified (1st page):  [facility] on call  Time of first page:  2304

## 2015-05-14 NOTE — Patient Outreach (Signed)
S: Here for a home visit and pt is very Designer, television/film set. He will arouse and say one word but doesn't seem to be really comprehending. It is as if he is in a deep sleep and cannoIt wake up.  BP 144/64 mmHg  Pulse 82  Resp 16  SpO2 97% NFBS 111  RRR, Lungs clear.  I have called pt's GI specialist Dr. Hilarie Fredrickson and got his nurse to give me his pager # as he is in the hospital. Dr. Hilarie Fredrickson took my report and he said he needs to be sent in to the hospital.  I have called 911 and am waiting for the Emergency Squad to arrive.  I gave the squad my report and advised that Dr. Hilarie Fredrickson will be awaiting his arrival at [facility].  I will advise our hospital liasion of this impending admission.  [name] [facility] [place] [phone]

## 2015-05-14 NOTE — ED Notes (Signed)
Bed: [facility] Expected date:  Expected time:  Means of arrival:  Comments: 

## 2015-05-14 NOTE — ED Notes (Signed)
Per EMS, patient is from home.  Patient is difficulty to arouse.  Responsive to verbal and painful stimulation.  Patient denies any falls or injuries.  No LOC.   BP: 130/70 R:16 P:72  CBG:111

## 2015-05-14 NOTE — ED Provider Notes (Signed)
CSN: [id number]     Arrival date & time 05/14/15  1510 History   First MD Initiated Contact with Patient 05/14/15 1543     Chief Complaint  Patient presents with  . Altered Mental Status     (Consider location/radiation/quality/duration/timing/severity/associated sxs/prior Treatment) HPI Comments: History obtained from wife and has had 24 hours of worsening confusion, decreased responsiveness. Similar to previous episodes of hepatic encephalopathy. She states he has been taking meds as prescribed and she actually gave it to him this morning. Patient not able to give any history and just answers 'yeah' to every question asked.   Patient is a 59 y.o. male presenting with altered mental status.  Altered Mental Status Presenting symptoms: partial responsiveness   Severity:  Moderate Most recent episode:  Today Episode history:  Single Duration:  24 hours Timing:  Constant Progression:  Worsening Chronicity:  Recurrent Context: not alcohol use, not drug use and not head injury   Associated symptoms: no abdominal pain, no agitation, no depression and no fever     Past Medical History  Diagnosis Date  . Cirrhosis ([place]) 2011    Cryptogenic, Likely NASH. Family/pt deny EtOH. HCV, HBV, HAV negative. ANA negative. AMA positive. Ascites 12/11  . Hyperlipemia   . Coronary artery disease     Inferior MI 12/11; LHC with occluded mid CFX and 80% proximal RCA. EF 55%. He had 3.0 x 28 vision BMS to CFX  . Diastolic CHF, acute ([place])     Echo 12/11 with ef 50-55% and mild LVH. EF 55% by LV0gram in 12/11  . Hypertension   . Type II diabetes mellitus ([place]) 2011  . SVT (supraventricular tachycardia) ([place])     1/12: appeared to be an ectopic atrial tachycardia. Required DCCV with hemodynamic instability  . GI bleed     12/11: Etiology not clearly defined. EGD: nonbleeding esophageal varices.  . S/P coronary artery stent placement 06/2010  . Esophageal varices ([place]) 2011, 2013    no hx acute  variceal bleed  . Hepatic encephalopathy ([place]) 2011, 12/2013  . Myocardial infarction [facility]) ? 2012  . Shortness of breath dyspnea   . Arthritis   . Hx of echocardiogram     Echo 5/16:  EF 55-60%, no RWMA, mod LAE  . Thrombocytopenia ([place])   . Cholelithiasis   . Rectal varices   . PFO (patent foramen ovale): Per TEE 04/20/2015 04/20/2015  . Bacteremia    Past Surgical History  Procedure Laterality Date  . Cardiac catheterization  07/01/2010    BMS to CFX.  Marland Kitchen Appendectomy    . Orif r leg    . Esophagogastroduodenoscopy  04/13/2012    Procedure: ESOPHAGOGASTRODUODENOSCOPY (EGD);  Surgeon: Inda Castle, MD;  Location: [facility] END[facility];  Service: Endoscopy;  Laterality: N/A;  . Colonoscopy  04/13/2012    Procedure: COLONOSCOPY;  Surgeon: Inda Castle, MD;  Location: [facility] ENDOSCOPY;  Service: Endoscopy;  Laterality: N/A;  . Esophagogastroduodenoscopy N/A 01/01/2014    Procedure: ESOPHAGOGASTRODUODENOSCOPY (EGD);  Surgeon: Jerene Bears, MD;  Location: [facility] ENDOSCOPY;  Service: Endoscopy;  Laterality: N/A;  . Esophageal banding N/A 01/01/2014    Procedure: ESOPHAGEAL BANDING;  Surgeon: Jerene Bears, MD;  Location: [facility] ENDOSCOPY;  Service: Endoscopy;  Laterality: N/A;  . Coronary stent placement  06/30/2010    CFX   Distal        . Left heart catheterization with coronary angiogram N/A 10/15/2014    Procedure: LEFT HEART CATHETERIZATION WITH CORONARY ANGIOGRAM;  Surgeon:  Peter M Martinique, MD;  Location: [facility] CATH LAB;  Service: Cardiovascular;  Laterality: N/A;  . Tee without cardioversion N/A 04/20/2015    Procedure: TRANSESOPHAGEAL ECHOCARDIOGRAM (TEE);  Surgeon: Fay Records, MD;  Location: [facility] ENDOSCOPY;  Service: Cardiovascular;  Laterality: N/A;   Family History  Problem Relation Age of Onset  . Heart attack Brother 37    MI  . Diabetes Brother   . Heart attack Father   . Diabetes Father   . COPD Father   . COPD Mother   . Stroke Neg Hx   . Heart attack Brother    Social History   Substance Use Topics  . Smoking status: Current Every Day Smoker -- 1.00 packs/day for 36 years    Types: E-cigarettes  . Smokeless tobacco: Current User     Comment: uses vapor cigarettes (2016 )  . Alcohol Use: No    Review of Systems  Unable to perform ROS: Mental status change  Constitutional: Positive for fatigue. Negative for fever and chills.  Gastrointestinal: Negative for abdominal pain.  Psychiatric/Behavioral: Negative for agitation.  All other systems reviewed and are negative.     Allergies  Isosorbide and Lactose intolerance (gi)  Home Medications   Prior to Admission medications   Medication Sig Start Date End Date Taking? Authorizing Provider  aspirin 81 MG tablet Take 81 mg by mouth daily.    Yes Historical Provider, MD  atorvastatin (LIPITOR) 10 MG tablet Take 10 mg by mouth daily at 6 PM.   Yes Historical Provider, MD  furosemide (LASIX) 40 MG tablet Take 40 mg by mouth daily.  01/09/12  Yes Larey Dresser, MD  insulin NPH Human (HUMULIN N,NOVOLIN N) 100 UNIT/ML injection Inject 0.35-0.8 mLs (35-80 Units total) into the skin See admin instructions. Take 80 units in the morning and 35 units in the evening. 04/20/15  Yes Eugenie Filler, MD  lactulose (CHRONULAC) 10 GM/15ML solution Take 30 g by mouth 3 (three) times daily. May take an additional dose as needed for bowel movement   Yes Historical Provider, MD  metFORMIN (GLUCOPHAGE) 1000 MG tablet Take 1,000 mg by mouth 2 (two) times daily. 11/27/14  Yes Historical Provider, MD  Multiple Vitamin (MULTIVITAMIN) capsule Take 1 capsule by mouth daily.     Yes Historical Provider, MD  nadolol (CORGARD) 40 MG tablet Take 1 tablet (40 mg total) by mouth daily. 01/01/15  Yes Jerene Bears, MD  nitroGLYCERIN (NITROSTAT) 0.4 MG SL tablet Place 0.4 mg under the tongue every 5 (five) minutes as needed for chest pain (chest pain).   Yes Historical Provider, MD  omeprazole (PRILOSEC) 20 MG capsule Take 1 capsule (20 mg total) by  mouth daily. 01/15/15  Yes Larey Dresser, MD  ranolazine (RANEXA) 500 MG 12 hr tablet Take 2 tablets (1,000 mg total) by mouth 2 (two) times daily. 01/15/15  Yes Larey Dresser, MD  rifaximin (XIFAXAN) 550 MG TABS tablet Take 1 tablet (550 mg total) by mouth 2 (two) times daily. 02/13/15  Yes Jerene Bears, MD  spironolactone (ALDACTONE) 25 MG tablet TAKE 1 TABLET EVERY DAY 05/13/15   Larey Dresser, MD  sulfamethoxazole-trimethoprim (BACTRIM DS,SEPTRA DS) 800-160 MG tablet Take 1 tablet by mouth daily. Resume in 2 weeks after IV antibiotics have been completed. Patient taking differently: Take 1 tablet by mouth daily.  05/04/15   Eugenie Filler, MD   BP 136/62 mmHg  Pulse 96  Temp(Src) 98.8 F (37.1 C) (Oral)  Resp 26  SpO2 97% Physical Exam  Constitutional: He appears well-developed and well-nourished.  HENT:  Head: Normocephalic and atraumatic.  Eyes: Conjunctivae are normal. Pupils are equal, round, and reactive to light.  Neck: Normal range of motion.  Cardiovascular: Normal rate.   Pulmonary/Chest: Effort normal. No respiratory distress.  Abdominal: He exhibits distension (slight). There is no tenderness. There is no rebound and no guarding.  Musculoskeletal: Normal range of motion.  Neurological: He is alert.  GCS:  8:  1,2,5 Localizes all extremities to pain PERRL  Skin: Skin is warm and dry.  Nursing note and vitals reviewed.   ED Course  Procedures (including critical care time) Labs Review Labs Reviewed  AMMONIA - Abnormal; Notable for the following:    Ammonia 122 (*)    All other components within normal limits  CBC WITH DIFFERENTIAL/PLATELET - Abnormal; Notable for the following:    RBC 4.04 (*)    MCV 101.5 (*)    MCH 34.9 (*)    Platelets 63 (*)    All other components within normal limits  COMPREHENSIVE METABOLIC PANEL - Abnormal; Notable for the following:    Glucose, Bld 134 (*)    Albumin 3.2 (*)    AST 72 (*)    Alkaline Phosphatase 131 (*)     Total Bilirubin 1.8 (*)    All other components within normal limits  LACTIC ACID, PLASMA - Abnormal; Notable for the following:    Lactic Acid, Venous 2.6 (*)    All other components within normal limits  URINALYSIS, ROUTINE W REFLEX MICROSCOPIC (NOT AT [facility]) - Abnormal; Notable for the following:    Color, Urine AMBER (*)    All other components within normal limits  MRSA PCR SCREENING    Imaging Review No results found. I have personally reviewed and evaluated these images and lab results as part of my medical decision-making.   EKG Interpretation   Date/Time:  Thursday May 14 2015 15:36:55 EDT Ventricular Rate:  81 PR Interval:  147 QRS Duration: 94 QT Interval:  436 QTC Calculation: 506 R Axis:   -23 Text Interpretation:  Sinus rhythm Inferolateral infarct, old Prolonged QT  interval Confirmed by [facility] MD, Corene Cornea [phone]) on 05/14/2015 5:52:29 PM      MDM   Final diagnoses:  Altered mental status, unspecified altered mental status type  Acute hepatic encephalopathy ([place])   Likely hepatic encephalopathy, will workup accordingly. Will also check UA, CXR. Fluid resusctiate.  Ammonia elevated, no other obvious causes, lactulose ordered, consulted hospitalist about stepdown admission.       Merrily Pew, MD 05/14/15 [phone]

## 2015-05-15 ENCOUNTER — Inpatient Hospital Stay (HOSPITAL_COMMUNITY): Payer: PPO

## 2015-05-15 DIAGNOSIS — R509 Fever, unspecified: Secondary | ICD-10-CM | POA: Insufficient documentation

## 2015-05-15 DIAGNOSIS — Z794 Long term (current) use of insulin: Secondary | ICD-10-CM

## 2015-05-15 DIAGNOSIS — E1165 Type 2 diabetes mellitus with hyperglycemia: Secondary | ICD-10-CM

## 2015-05-15 DIAGNOSIS — K7290 Hepatic failure, unspecified without coma: Secondary | ICD-10-CM

## 2015-05-15 DIAGNOSIS — D696 Thrombocytopenia, unspecified: Secondary | ICD-10-CM

## 2015-05-15 DIAGNOSIS — Q211 Atrial septal defect: Secondary | ICD-10-CM

## 2015-05-15 DIAGNOSIS — R5081 Fever presenting with conditions classified elsewhere: Secondary | ICD-10-CM

## 2015-05-15 LAB — BLOOD GAS, ARTERIAL
Acid-base deficit: 2 mmol/L (ref 0.0–2.0)
Bicarbonate: 19.4 mEq/L — ABNORMAL LOW (ref 20.0–24.0)
Drawn by: 331471
O2 SAT: 95.4 %
PCO2 ART: 24.9 mmHg — AB (ref 35.0–45.0)
PO2 ART: 73.8 mmHg — AB (ref 80.0–100.0)
Patient temperature: 98.6
TCO2: 16.9 mmol/L (ref 0–100)
pH, Arterial: 7.503 — ABNORMAL HIGH (ref 7.350–7.450)

## 2015-05-15 LAB — GLUCOSE, CAPILLARY
GLUCOSE-CAPILLARY: 163 mg/dL — AB (ref 65–99)
Glucose-Capillary: 156 mg/dL — ABNORMAL HIGH (ref 65–99)
Glucose-Capillary: 201 mg/dL — ABNORMAL HIGH (ref 65–99)
Glucose-Capillary: 158 mg/dL — ABNORMAL HIGH (ref 65–99)

## 2015-05-15 LAB — CBC
HEMATOCRIT: 40.2 % (ref 39.0–52.0)
HEMOGLOBIN: 13.8 g/dL (ref 13.0–17.0)
MCH: 34.3 pg — ABNORMAL HIGH (ref 26.0–34.0)
MCHC: 34.3 g/dL (ref 30.0–36.0)
MCV: 100 fL (ref 78.0–100.0)
Platelets: 62 10*3/uL — ABNORMAL LOW (ref 150–400)
RBC: 4.02 MIL/uL — ABNORMAL LOW (ref 4.22–5.81)
RDW: 13.6 % (ref 11.5–15.5)
WBC: 7 10*3/uL (ref 4.0–10.5)

## 2015-05-15 LAB — COMPREHENSIVE METABOLIC PANEL
ALK PHOS: 93 U/L (ref 38–126)
ALT: 44 U/L (ref 17–63)
AST: 85 U/L — AB (ref 15–41)
Albumin: 3.2 g/dL — ABNORMAL LOW (ref 3.5–5.0)
Anion gap: 9 (ref 5–15)
BILIRUBIN TOTAL: 2.4 mg/dL — AB (ref 0.3–1.2)
BUN: 16 mg/dL (ref 6–20)
CALCIUM: 8.8 mg/dL — AB (ref 8.9–10.3)
CO2: 21 mmol/L — ABNORMAL LOW (ref 22–32)
CREATININE: 0.83 mg/dL (ref 0.61–1.24)
Chloride: 109 mmol/L (ref 101–111)
GFR calc Af Amer: >60 mL/min (ref >60)
GLUCOSE: 175 mg/dL — AB (ref 65–99)
POTASSIUM: 4.8 mmol/L (ref 3.5–5.1)
Sodium: 139 mmol/L (ref 135–145)
TOTAL PROTEIN: 6.7 g/dL (ref 6.5–8.1)

## 2015-05-15 LAB — PROTIME-INR
INR: 1.42 (ref 0.00–1.49)
PROTHROMBIN TIME: 17.4 s — AB (ref 11.6–15.2)

## 2015-05-15 LAB — MAGNESIUM: Magnesium: 1.6 mg/dL — ABNORMAL LOW (ref 1.7–2.4)

## 2015-05-15 LAB — AMMONIA: Ammonia: 88 umol/L — ABNORMAL HIGH (ref 9–35)

## 2015-05-15 LAB — MRSA PCR SCREENING: MRSA by PCR: NEGATIVE

## 2015-05-15 LAB — LACTIC ACID, PLASMA: Lactic Acid, Venous: 2.5 mmol/L (ref 0.5–2.0)

## 2015-05-15 LAB — PHOSPHORUS: Phosphorus: 3.4 mg/dL (ref 2.5–4.6)

## 2015-05-15 MED ORDER — VITAMINS A & D EX OINT
TOPICAL_OINTMENT | CUTANEOUS | Status: AC
  Administered 2015-05-15: 1
  Filled 2015-05-15: qty 5

## 2015-05-15 MED ORDER — HYDRALAZINE HCL 20 MG/ML IJ SOLN: 10.0000 mg | Freq: Once | INTRAMUSCULAR | Status: DC

## 2015-05-15 MED ORDER — SODIUM CHLORIDE 0.45 % IV SOLN
INTRAVENOUS | Status: DC
  Administered 2015-05-15 – 2015-05-16 (×2): via INTRAVENOUS

## 2015-05-15 MED ORDER — RIFAXIMIN 550 MG PO TABS
550.0000 mg | ORAL_TABLET | Freq: Two times a day (BID) | ORAL | Status: DC
  Administered 2015-05-16 – 2015-05-17 (×3): 550 mg via ORAL
  Filled 2015-05-15 (×7): qty 1

## 2015-05-15 MED ORDER — HYDRALAZINE HCL 20 MG/ML IJ SOLN: 20.0000 mg | Freq: Four times a day (QID) | INTRAMUSCULAR | Status: DC | PRN

## 2015-05-15 MED ORDER — ONDANSETRON HCL 4 MG PO TABS: 4.0000 mg | ORAL_TABLET | Freq: Four times a day (QID) | ORAL | Status: DC | PRN

## 2015-05-15 MED ORDER — VANCOMYCIN HCL IN DEXTROSE 1-5 GM/200ML-% IV SOLN: 1000.0000 mg | INTRAVENOUS | Status: DC

## 2015-05-15 MED ORDER — LACTULOSE 10 GM/15ML PO SOLN
30.0000 g | Freq: Three times a day (TID) | ORAL | Status: DC
  Administered 2015-05-15 – 2015-05-17 (×7): 30 g via ORAL
  Filled 2015-05-15 (×7): qty 45

## 2015-05-15 MED ORDER — SODIUM CHLORIDE 0.9 % IV BOLUS (SEPSIS)
1000.0000 mL | Freq: Once | INTRAVENOUS | Status: AC
  Administered 2015-05-15: 1000 mL via INTRAVENOUS

## 2015-05-15 MED ORDER — CHLORHEXIDINE GLUCONATE 0.12 % MT SOLN
15.0000 mL | Freq: Two times a day (BID) | OROMUCOSAL | Status: DC
  Administered 2015-05-15 – 2015-05-17 (×5): 15 mL via OROMUCOSAL
  Filled 2015-05-15 (×3): qty 15

## 2015-05-15 MED ORDER — SODIUM CHLORIDE 0.9 % IJ SOLN
3.0000 mL | Freq: Two times a day (BID) | INTRAMUSCULAR | Status: DC
  Administered 2015-05-15 – 2015-05-17 (×5): 3 mL via INTRAVENOUS

## 2015-05-15 MED ORDER — MAGNESIUM SULFATE 2 GM/50ML IV SOLN
2.0000 g | Freq: Once | INTRAVENOUS | Status: AC
  Administered 2015-05-15: 2 g via INTRAVENOUS
  Filled 2015-05-15: qty 50

## 2015-05-15 MED ORDER — CETYLPYRIDINIUM CHLORIDE 0.05 % MT LIQD
7.0000 mL | Freq: Two times a day (BID) | OROMUCOSAL | Status: DC
  Administered 2015-05-15 – 2015-05-17 (×5): 7 mL via OROMUCOSAL

## 2015-05-15 MED ORDER — HYDRALAZINE HCL 20 MG/ML IJ SOLN
10.0000 mg | Freq: Four times a day (QID) | INTRAMUSCULAR | Status: DC | PRN
  Administered 2015-05-15: 10 mg via INTRAVENOUS
  Filled 2015-05-15: qty 1

## 2015-05-15 MED ORDER — ACETAMINOPHEN 650 MG RE SUPP
650.0000 mg | Freq: Four times a day (QID) | RECTAL | Status: DC | PRN
  Administered 2015-05-15 (×3): 650 mg via RECTAL
  Filled 2015-05-15 (×3): qty 1

## 2015-05-15 MED ORDER — ONDANSETRON HCL 4 MG/2ML IJ SOLN: 4.0000 mg | Freq: Four times a day (QID) | INTRAMUSCULAR | Status: DC | PRN

## 2015-05-15 MED ORDER — PIPERACILLIN-TAZOBACTAM 3.375 G IVPB: 3.3750 g | Freq: Three times a day (TID) | INTRAVENOUS | Status: DC

## 2015-05-15 MED ORDER — INSULIN ASPART 100 UNIT/ML ~~LOC~~ SOLN
0.0000 [IU] | Freq: Three times a day (TID) | SUBCUTANEOUS | Status: DC
  Administered 2015-05-15 – 2015-05-16 (×2): 2 [IU] via SUBCUTANEOUS
  Administered 2015-05-16: 1 [IU] via SUBCUTANEOUS
  Administered 2015-05-16: 3 [IU] via SUBCUTANEOUS
  Administered 2015-05-17: 1 [IU] via SUBCUTANEOUS

## 2015-05-15 NOTE — Progress Notes (Signed)
Patient ID: Adam Butler, male   DOB: 09-02-1955, 59 y.o.   MRN: [id number] Patient presented to ultrasound department today for paracentesis. On limited ultrasound of abdomen in all 4 quadrants there is no significant ascites present. Paracentesis was not performed. Patient's wife and nurse aware of above.

## 2015-05-15 NOTE — Progress Notes (Signed)
[facility] PROGRESS NOTE  HERALD VALLIN MVH:[id number] DOB: 1956/03/20 DOA: 05/14/2015 PCP: [name], NP  Assessment/Plan: 1. Encephalopathy- ammonia level has come down to 88, patient continues to have fever. Infectious disease consulted ultrasound-guided paracentesis ordered to rule out SBP. No antibiotics recommended at this time by ID. We'll follow the culture results. 2. Recent strep viridans bacteremia- patient was treated with IV antibiotics for 3 weeks per infectious disease.  3.  Diabetes mellitus- start sliding scale insulin with NovoLog. Continue to hold the oral hypoglycemic agents 4. Thrombocytopenia- likely from the cryptogenic liver cirrhosis 5. Hypomagnesemia- magnesium level I.6, replace magnesium and check  level in a.m.  Code 3  Status:  Full code Family Communication: no family at bedside Disposition Plan: Skilled nursing facility   Consultants:  Infectious disease  Procedures:  None  Antibiotics:  None  HPI/Subjective: 59 y.o. male with a past medical history of cryptogenic liver cirrhosis, hyperlipidemia, CAD, diastolic CHF, hypertension, type 2 diabetes, SVT, with multiple episodes of hepatic encephalopathy secondary to hyperammonemia who is brought to the emergency department after his wife has noticed a 1 day history of worsening mental status, decreased alertness and confusion, which she describes as usual symptoms of hepatic encephalopathy for this patient. She denies witnessing fever, dyspnea, nausea or emesis, diarrhea or urinary symptoms. She is states that recently his lactulose was increased to 3 times a day and the patient last bowel movement was yesterday.   This morning patient continues to be confused, Tmax 101.9  Objective: Filed Vitals:   05/15/15 1100  BP: 153/59  Pulse: 92  Temp: 100.9 F (38.3 C)  Resp: 27    Intake/Output Summary (Last 24 hours) at 05/15/15 1236 Last data filed at 05/15/15 1000  Gross per 24 hour   Intake   1550 ml  Output    300 ml  Net   1250 ml   Filed Weights   05/14/15 2200  Weight: 125.2 kg (276 lb 0.3 oz)    Exam:   General:  Confused   Cardiovascular: S1S2 RRR  Respiratory: Clear bilaterally  Abdomen: Soft, nontender, nondistended  Musculoskeletal: No cyanosis/clubbing/edema  Data Reviewed: Basic Metabolic Panel:  Recent Labs Lab 05/14/15 1603 05/15/15 0410 05/15/15 0415  NA 139  --  139  K 4.4  --  4.8  CL 107  --  109  CO2 25  --  21*  GLUCOSE 134*  --  175*  BUN 13  --  16  CREATININE 0.89  --  0.83  CALCIUM 8.9  --  8.8*  MG  --  1.6*  --   PHOS  --  3.4  --    Liver Function Tests:  Recent Labs Lab 05/14/15 1603 05/15/15 0415  AST 72* 85*  ALT 45 44  ALKPHOS 131* 93  BILITOT 1.8* 2.4*  PROT 7.0 6.7  ALBUMIN 3.2* 3.2*   No results for input(s): LIPASE, AMYLASE in the last 168 hours.  Recent Labs Lab 05/14/15 1603 05/15/15 0415  AMMONIA 122* 88*   CBC:  Recent Labs Lab 05/14/15 1603 05/15/15 0415  WBC 5.6 7.0  NEUTROABS 4.2  --   HGB 14.1 13.8  HCT 41.0 40.2  MCV 101.5* 100.0  PLT 63* 62*   Cardiac Enzymes: No results for input(s): CKTOTAL, CKMB, CKMBINDEX, TROPONINI in the last 168 hours. BNP (last 3 results)  Recent Labs  10/14/14 2248  BNP 56.1    ProBNP (last 3 results)  Recent Labs  12/11/14 0913  PROBNP  38.0    CBG:  Recent Labs Lab 05/08/15 1701 05/15/15 0800 05/15/15 1206  GLUCAP 148* 156* 201*    Recent Results (from the past 240 hour(s))  MRSA PCR Screening     Status: None   Collection Time: 05/07/15  2:30 PM  Result Value Ref Range Status   MRSA by PCR NEGATIVE NEGATIVE Final    Comment:        The GeneXpert MRSA Assay (FDA approved for NASAL specimens only), is one component of a comprehensive MRSA colonization surveillance program. It is not intended to diagnose MRSA infection nor to guide or monitor treatment for MRSA infections.   MRSA PCR Screening     Status:  None   Collection Time: 05/14/15 11:09 PM  Result Value Ref Range Status   MRSA by PCR NEGATIVE NEGATIVE Final    Comment:        The GeneXpert MRSA Assay (FDA approved for NASAL specimens only), is one component of a comprehensive MRSA colonization surveillance program. It is not intended to diagnose MRSA infection nor to guide or monitor treatment for MRSA infections.      Studies: Dg Chest Port 1 View  05/15/2015  CLINICAL DATA:  Hepatic encephalopathy.  Fever. EXAM: PORTABLE CHEST 1 VIEW COMPARISON:  05/07/2015 FINDINGS: A single AP portable view of the chest demonstrates no focal airspace consolidation or alveolar edema. The lungs are grossly clear. There is no large effusion or pneumothorax. Cardiac and mediastinal contours appear unremarkable. IMPRESSION: No active disease. Electronically Signed   By: Andreas Newport M.D.   On: 05/15/2015 06:34    Scheduled Meds: . antiseptic oral rinse  7 mL Mouth Rinse q12n4p  . chlorhexidine  15 mL Mouth Rinse BID  . hydrALAZINE  10 mg Intravenous Once  . lactulose  30 g Oral TID  . sodium chloride  3 mL Intravenous Q12H   Continuous Infusions: . sodium chloride 50 mL/hr at 05/15/15 0100    Principal Problem:   Hepatic encephalopathy (HCC) Active Problems:   CAD, NATIVE VESSEL   DIASTOLIC HEART FAILURE, CHRONIC   Cryptogenic cirrhosis (HCC)   Type 2 diabetes mellitus, uncontrolled (HCC)   PFO (patent foramen ovale): Per TEE 04/20/2015   Thrombocytopenia (HCC)   FUO (fever of unknown origin)    Time spent: 25 min    [facility] Pager [phone]*. If 7PM-7AM, please contact night-coverage at [email or web address], password [facility] 05/15/2015, 12:36 PM  LOS: 1 day

## 2015-05-15 NOTE — Progress Notes (Signed)
ANTIBIOTIC CONSULT NOTE - INITIAL  Pharmacy Consult for vancomyzin/zosyn Indication: rule out sepsis  Allergies  Allergen Reactions  . Isosorbide Other (See Comments)    Nose bleeds  . Lactose Intolerance (Gi) Other (See Comments)    Patient Measurements: Height: 5' 8"  (172.7 cm) Weight: 276 lb 0.3 oz (125.2 kg) IBW/kg (Calculated) : 68.4 Adjusted Body Weight:   Vital Signs: Temp: 101.8 F (38.8 C) (11/04 0700) Temp Source: Rectal (11/04 0600) BP: 166/74 mmHg (11/04 0700) Pulse Rate: 104 (11/04 0700) Intake/Output from previous day: 11/03 0701 - 11/04 0700 In: 350 [I.V.:300; IV Piggyback:50] Out: -  Intake/Output from this shift:    Labs:  Recent Labs  05/14/15 1603 05/15/15 0415  WBC 5.6 7.0  HGB 14.1 13.8  PLT 63* 62*  CREATININE 0.89 0.83   Estimated Creatinine Clearance: 123.5 mL/min (by C-G formula based on Cr of 0.83). No results for input(s): VANCOTROUGH, VANCOPEAK, VANCORANDOM, GENTTROUGH, GENTPEAK, GENTRANDOM, TOBRATROUGH, TOBRAPEAK, TOBRARND, AMIKACINPEAK, AMIKACINTROU, AMIKACIN in the last 72 hours.   Microbiology: Recent Results (from the past 720 hour(s))  Respiratory virus panel     Status: None   Collection Time: 04/15/15 12:54 PM  Result Value Ref Range Status   Respiratory Syncytial Virus A Negative Negative Final   Respiratory Syncytial Virus B Negative Negative Final   Influenza A Negative Negative Final   Influenza B Negative Negative Final   Parainfluenza 1 Negative Negative Final   Parainfluenza 2 Negative Negative Final   Parainfluenza 3 Negative Negative Final   Metapneumovirus Negative Negative Final   Rhinovirus Negative Negative Final   Adenovirus Negative Negative Final    Comment: (NOTE) Performed At: [facility] [address] [place], [place] [id number] Lindon Romp MD HY:[id number]   Culture, blood (routine x 2)     Status: None   Collection Time: 04/17/15 12:25 PM  Result Value Ref Range Status    Specimen Description BLOOD BLOOD LEFT HAND  Final   Special Requests BOTTLES DRAWN AEROBIC AND ANAEROBIC 5CC  Final   Culture NO GROWTH 5 DAYS  Final   Report Status 04/22/2015 FINAL  Final  Culture, blood (routine x 2)     Status: None   Collection Time: 04/17/15 12:30 PM  Result Value Ref Range Status   Specimen Description BLOOD BLOOD RIGHT HAND  Final   Special Requests IN PEDIATRIC BOTTLE 4CC  Final   Culture NO GROWTH 5 DAYS  Final   Report Status 04/22/2015 FINAL  Final  Blood Culture (routine x 2)     Status: None   Collection Time: 05/02/15  7:49 PM  Result Value Ref Range Status   Specimen Description BLOOD PICC LINE  Final   Special Requests BOTTLES DRAWN AEROBIC AND ANAEROBIC 4CC  Final   Culture NO GROWTH 5 DAYS  Final   Report Status 05/07/2015 FINAL  Final  Blood Culture (routine x 2)     Status: None   Collection Time: 05/02/15  7:49 PM  Result Value Ref Range Status   Specimen Description BLOOD LEFT HAND  Final   Special Requests BOTTLES DRAWN AEROBIC ONLY 5CC  Final   Culture NO GROWTH 5 DAYS  Final   Report Status 05/07/2015 FINAL  Final  Urine culture     Status: None   Collection Time: 05/02/15 11:01 PM  Result Value Ref Range Status   Specimen Description URINE, RANDOM  Final   Special Requests NONE  Final   Culture 5,000 COLONIES/mL INSIGNIFICANT GROWTH  Final  Report Status 05/04/2015 FINAL  Final  MRSA PCR Screening     Status: None   Collection Time: 05/07/15  2:30 PM  Result Value Ref Range Status   MRSA by PCR NEGATIVE NEGATIVE Final    Comment:        The GeneXpert MRSA Assay (FDA approved for NASAL specimens only), is one component of a comprehensive MRSA colonization surveillance program. It is not intended to diagnose MRSA infection nor to guide or monitor treatment for MRSA infections.   MRSA PCR Screening     Status: None   Collection Time: 05/14/15 11:09 PM  Result Value Ref Range Status   MRSA by PCR NEGATIVE NEGATIVE Final     Comment:        The GeneXpert MRSA Assay (FDA approved for NASAL specimens only), is one component of a comprehensive MRSA colonization surveillance program. It is not intended to diagnose MRSA infection nor to guide or monitor treatment for MRSA infections.     Medical History: Past Medical History  Diagnosis Date  . Cirrhosis ([place]) 2011    Cryptogenic, Likely NASH. Family/pt deny EtOH. HCV, HBV, HAV negative. ANA negative. AMA positive. Ascites 12/11  . Hyperlipemia   . Coronary artery disease     Inferior MI 12/11; LHC with occluded mid CFX and 80% proximal RCA. EF 55%. He had 3.0 x 28 vision BMS to CFX  . Diastolic CHF, acute ([place])     Echo 12/11 with ef 50-55% and mild LVH. EF 55% by LV0gram in 12/11  . Hypertension   . Type II diabetes mellitus ([place]) 2011  . SVT (supraventricular tachycardia) ([place])     1/12: appeared to be an ectopic atrial tachycardia. Required DCCV with hemodynamic instability  . GI bleed     12/11: Etiology not clearly defined. EGD: nonbleeding esophageal varices.  . S/P coronary artery stent placement 06/2010  . Esophageal varices ([place]) 2011, 2013    no hx acute variceal bleed  . Hepatic encephalopathy ([place]) 2011, 12/2013  . Myocardial infarction [facility]) ? 2012  . Shortness of breath dyspnea   . Arthritis   . Hx of echocardiogram     Echo 5/16:  EF 55-60%, no RWMA, mod LAE  . Thrombocytopenia ([place])   . Cholelithiasis   . Rectal varices   . PFO (patent foramen ovale): Per TEE 04/20/2015 04/20/2015  . Bacteremia    Assessment: 33 YOM presents with AMS.  PMH includes crytogenic liver cirrhosis and multiple episodes of hepatic encephalopathy.  No antibiotics ordered at admission, now orders for vancomycin/zosyn per pharmacy for possible sepsis.  Lactic acid = 2.6 at admission with repeat level = 2.5. Tachycardia and  Fever 101.9 noted 11/4 am.  11/4 >> vanco  >> 11/4 >> pip/tazo >>    11/4 blood: 11/4 urine:   Dose changes/levels:  WBC  WNL Renal: SCr WNL + fever  Goal of Therapy:  Vancomycin trough level 15-20 mcg/ml  Plan:   Vancomycin 1gm IV x 1 then 1234m IV q12h - give next dose < 12h from 1gm dose to complete staggered loading dose  Follow renal function and check levels as indicated  Zosyn 3.375gm IV q8h over 4h infusion  DDoreene Eland PharmD, BCPS.   Pager: [phone]/10/2014 8:43 AM

## 2015-05-15 NOTE — Care Management Note (Signed)
Case Management Note  Patient Details  Name: Adam Butler MRN: [id number] Date of Birth: 10-25-1955  Subjective/Objective:         Hepatic enceplaholpathy           Action/Plan:Date: May 15, 2015 Chart reviewed for concurrent status and case management needs. Will continue to follow patient for changes and needs: Velva Harman, RN, BSN, [place]   [phone]   Expected Discharge Date:   (unknown)               Expected Discharge Plan:  Skilled Nursing Facility  In-House Referral:  Clinical Social Work  Discharge planning Services  CM Consult  Post Acute Care Choice:  NA Choice offered to:  NA  DME Arranged:    DME Agency:     HH Arranged:    [place] Agency:     Status of Service:  In process, will continue to follow  Medicare Important Message Given:    Date Medicare IM Given:    Medicare IM give by:    Date Additional Medicare IM Given:    Additional Medicare Important Message give by:     If discussed at [place] of Stay Meetings, dates discussed:    Additional Comments:  Leeroy Cha, RN 05/15/2015, 11:38 AM

## 2015-05-15 NOTE — Progress Notes (Signed)
Blood cultures have been drawn by lab.  Spoke with lab tech, Tomasita Crumble, for verification.

## 2015-05-15 NOTE — Consult Note (Signed)
   [facility] CM Inpatient Consult   05/15/2015  EDSON DERIDDER 11/03/1955 [id number]   Notified by [facility] NP that patient of patient admission. Mr. Lafavor is active with [facility]. Please see chart review tab then notes in EPIC for further [facility] encounter notes. Patient could benefit from SNF placement if agreeable and if he qualifies per [facility] NP. Patient has had x 6 admits in the past 6 months. Will make inpatient RNCM and inpatient Licensed CSW aware that patient is active with [facility]. Will continue to follow.  Marthenia Rolling, MSN-Ed, RN,BSN [facility] Liaison [phone]

## 2015-05-15 NOTE — Consult Note (Signed)
[facility]    Date of Admission:  05/14/2015  Date of Consult:  05/15/2015  Reason for Consult: FUO Referring Physician: Dr. Darrick Meigs   HPI: Adam Butler is an 59 y.o. male. 71 with cryptogenic cirrhosis coronary artery disease who developed viridans group streptococcal bacteremia. He was seen by our group and plans were for 4 weeks of IV antibiotics. In the interim  underwent a transesophageal echocardiogram which failed to show any evidence of endocarditis  Subsequent to that he was admitted with hepatic encephalopathy related to his liver disease. I was asked see the patient  Then due to concerns about whether he needed further antimicrobial therapy at this point in time. Based on the fact that he did not have any endocarditis by TEE and that he  had 3 weeks of IV antibiotics since clearance of his blood cultures I stopped his antibiotics and had his PICC line pulled.   He is now admitted with what seems to be hepatic encephalopathy again with very him ammonia levels. He also has had a fever. Blood cultures have been drawn, CXR done which is clear, UA negative.   Past Medical History  Diagnosis Date  . Cirrhosis ([place]) 2011    Cryptogenic, Likely NASH. Family/pt deny EtOH. HCV, HBV, HAV negative. ANA negative. AMA positive. Ascites 12/11  . Hyperlipemia   . Coronary artery disease     Inferior MI 12/11; LHC with occluded mid CFX and 80% proximal RCA. EF 55%. He had 3.0 x 28 vision BMS to CFX  . Diastolic CHF, acute ([place])     Echo 12/11 with ef 50-55% and mild LVH. EF 55% by LV0gram in 12/11  . Hypertension   . Type II diabetes mellitus ([place]) 2011  . SVT (supraventricular tachycardia) ([place])     1/12: appeared to be an ectopic atrial tachycardia. Required DCCV with hemodynamic instability  . GI bleed     12/11: Etiology not clearly defined. EGD: nonbleeding esophageal varices.  . S/P coronary artery stent placement 06/2010  .  Esophageal varices ([place]) 2011, 2013    no hx acute variceal bleed  . Hepatic encephalopathy ([place]) 2011, 12/2013  . Myocardial infarction [facility]) ? 2012  . Shortness of breath dyspnea   . Arthritis   . Hx of echocardiogram     Echo 5/16:  EF 55-60%, no RWMA, mod LAE  . Thrombocytopenia ([place])   . Cholelithiasis   . Rectal varices   . PFO (patent foramen ovale): Per TEE 04/20/2015 04/20/2015  . Bacteremia     Past Surgical History  Procedure Laterality Date  . Cardiac catheterization  07/01/2010    BMS to CFX.  Marland Kitchen Appendectomy    . Orif r leg    . Esophagogastroduodenoscopy  04/13/2012    Procedure: ESOPHAGOGASTRODUODENOSCOPY (EGD);  Surgeon: Inda Castle, MD;  Location: [facility] ENDOSCOPY;  Service: Endoscopy;  Laterality: N/A;  . Colonoscopy  04/13/2012    Procedure: COLONOSCOPY;  Surgeon: Inda Castle, MD;  Location: [facility] ENDOSCOPY;  Service: Endoscopy;  Laterality: N/A;  . Esophagogastroduodenoscopy N/A 01/01/2014    Procedure: ESOPHAGOGASTRODUODENOSCOPY (EGD);  Surgeon: Jerene Bears, MD;  Location[facility] ENDOSCOPY;  Service: Endoscopy;  Laterality: N/A;  . Esophageal banding N/A 01/01/2014    Procedure: ESOPHAGEAL BANDING;  Surgeon: Jerene Bears, MD;  Location: [facility] ENDOSCOPY;  Service: Endoscopy;  Laterality: N/A;  . Coronary stent placement  06/30/2010    CFX  Distal        . Left heart catheterization with coronary angiogram N/A 10/15/2014    Procedure: LEFT HEART CATHETERIZATION WITH CORONARY ANGIOGRAM;  Surgeon: Peter M Martinique, MD;  Location: [facility] CATH LAB;  Service: Cardiovascular;  Laterality: N/A;  . Tee without cardioversion N/A 04/20/2015    Procedure: TRANSESOPHAGEAL ECHOCARDIOGRAM (TEE);  Surgeon: Fay Records, MD;  Location: [facility] ENDOSCOPY;  Service: Cardiovascular;  Laterality: N/A;    Social History:  reports that he has been smoking E-cigarettes.  He has a 36 pack-year smoking history. He uses smokeless tobacco. He reports that he does not drink alcohol or use illicit  drugs.   Family History  Problem Relation Age of Onset  . Heart attack Brother 26    MI  . Diabetes Brother   . Heart attack Father   . Diabetes Father   . COPD Father   . COPD Mother   . Stroke Neg Hx   . Heart attack Brother     Allergies  Allergen Reactions  . Isosorbide Other (See Comments)    Nose bleeds  . Lactose Intolerance (Gi) Other (See Comments)     Medications: I have reviewed patients current medications as documented in Epic Anti-infectives    Start     Dose/Rate Route Frequency Ordered Stop   05/15/15 0900  vancomycin (VANCOCIN) IVPB 1000 mg/200 mL premix  Status:  Discontinued     1,000 mg 200 mL/hr over 60 Minutes Intravenous NOW 05/15/15 0813 05/15/15 0854   05/15/15 0900  piperacillin-tazobactam (ZOSYN) IVPB 3.375 g  Status:  Discontinued     3.375 g 12.5 mL/hr over 240 Minutes Intravenous 3 times per day 05/15/15 0813 05/15/15 0854         ROS: not obtainable due to delirium   Blood pressure 117/52, pulse 98, temperature 100.8 F (38.2 C), temperature source Core (Comment), resp. rate 24, height _0  (1.727 m), weight 276 lb 0.3 oz (125.2 kg), SpO2 94 %. General:  Awake but confused, oriented to person but not situation HEENT: anicteric sclera,  EOMI, oropharynx with some exfoliation of dried lips Cardiovascular: regular rate, normal r,  no murmur rubs or gallops Pulmonary: clear to auscultation bilaterally, no wheezing, rales or rhonchi Gastrointestinal: soft nontender, not especially tender normal bowel sounds, I could not appreciate a clear cut fluid wave Musculoskeletal: no  clubbing or edema noted bilaterally Skin, soft tissue: no rashes Neuro: nonfocal, strength and sensation intact   Results for orders placed or performed during the hospital encounter of 05/14/15 (from the past 48 hour(s))  Ammonia     Status: Abnormal   Collection Time: 05/14/15  4:03 PM  Result Value Ref Range   Ammonia 122 (H) 9 - 35 umol/L  CBC with  Differential     Status: Abnormal   Collection Time: 05/14/15  4:03 PM  Result Value Ref Range   WBC 5.6 4.0 - 10.5 K/uL   RBC 4.04 (L) 4.22 - 5.81 MIL/uL   Hemoglobin 14.1 13.0 - 17.0 g/dL   HCT 41.0 39.0 - 52.0 %   MCV 101.5 (H) 78.0 - 100.0 fL   MCH 34.9 (H) 26.0 - 34.0 pg   MCHC 34.4 30.0 - 36.0 g/dL   RDW 13.7 11.5 - 15.5 %   Platelets 63 (L) 150 - 400 K/uL    Comment: REPEATED TO VERIFY SPECIMEN CHECKED FOR CLOTS PLATELET COUNT CONFIRMED BY SMEAR    Neutrophils Relative % 74 %   Neutro Abs 4.2 1.7 -  7.7 K/uL   Lymphocytes Relative 12 %   Lymphs Abs 0.7 0.7 - 4.0 K/uL   Monocytes Relative 12 %   Monocytes Absolute 0.7 0.1 - 1.0 K/uL   Eosinophils Relative 1 %   Eosinophils Absolute 0.1 0.0 - 0.7 K/uL   Basophils Relative 1 %   Basophils Absolute 0.0 0.0 - 0.1 K/uL  Comprehensive metabolic panel     Status: Abnormal   Collection Time: 05/14/15  4:03 PM  Result Value Ref Range   Sodium 139 135 - 145 mmol/L   Potassium 4.4 3.5 - 5.1 mmol/L   Chloride 107 101 - 111 mmol/L   CO2 25 22 - 32 mmol/L   Glucose, Bld 134 (H) 65 - 99 mg/dL   BUN 13 6 - 20 mg/dL   Creatinine, Ser 0.89 0.61 - 1.24 mg/dL   Calcium 8.9 8.9 - 10.3 mg/dL   Total Protein 7.0 6.5 - 8.1 g/dL   Albumin 3.2 (L) 3.5 - 5.0 g/dL   AST 72 (H) 15 - 41 U/L   ALT 45 17 - 63 U/L   Alkaline Phosphatase 131 (H) 38 - 126 U/L   Total Bilirubin 1.8 (H) 0.3 - 1.2 mg/dL   GFR calc non Af Amer >60 >60 mL/min   GFR calc Af Amer >60 >60 mL/min    Comment: (NOTE) The eGFR has been calculated using the CKD EPI equation. This calculation has not been validated in all clinical situations. eGFR's persistently <60 mL/min signify possible Chronic Kidney Disease.    Anion gap 7 5 - 15  Urinalysis, Routine w reflex microscopic (not at [facility])     Status: Abnormal   Collection Time: 05/14/15  6:13 PM  Result Value Ref Range   Color, Urine AMBER (A) YELLOW    Comment: BIOCHEMICALS MAY BE AFFECTED BY COLOR   APPearance CLEAR  CLEAR   Specific Gravity, Urine 1.027 1.005 - 1.030   pH 7.5 5.0 - 8.0   Glucose, UA NEGATIVE NEGATIVE mg/dL   Hgb urine dipstick NEGATIVE NEGATIVE   Bilirubin Urine NEGATIVE NEGATIVE   Ketones, ur NEGATIVE NEGATIVE mg/dL   Protein, ur NEGATIVE NEGATIVE mg/dL   Urobilinogen, UA 0.2 0.0 - 1.0 mg/dL   Nitrite NEGATIVE NEGATIVE   Leukocytes, UA NEGATIVE NEGATIVE    Comment: MICROSCOPIC NOT DONE ON URINES WITH NEGATIVE PROTEIN, BLOOD, LEUKOCYTES, NITRITE, OR GLUCOSE <1000 mg/dL.  Lactic acid, plasma     Status: Abnormal   Collection Time: 05/14/15 10:12 PM  Result Value Ref Range   Lactic Acid, Venous 2.6 (HH) 0.5 - 2.0 mmol/L    Comment: CRITICAL RESULT CALLED TO, READ BACK BY AND VERIFIED WITH: M.BARHAM,RN AT 2302 ON 05/14/15 BY W.SHEA.   MRSA PCR Screening     Status: None   Collection Time: 05/14/15 11:09 PM  Result Value Ref Range   MRSA by PCR NEGATIVE NEGATIVE    Comment:        The GeneXpert MRSA Assay (FDA approved for NASAL specimens only), is one component of a comprehensive MRSA colonization surveillance program. It is not intended to diagnose MRSA infection nor to guide or monitor treatment for MRSA infections.   Magnesium     Status: Abnormal   Collection Time: 05/15/15  4:10 AM  Result Value Ref Range   Magnesium 1.6 (L) 1.7 - 2.4 mg/dL  Phosphorus     Status: None   Collection Time: 05/15/15  4:10 AM  Result Value Ref Range   Phosphorus 3.4 2.5 - 4.6  mg/dL  CBC     Status: Abnormal   Collection Time: 05/15/15  4:15 AM  Result Value Ref Range   WBC 7.0 4.0 - 10.5 K/uL   RBC 4.02 (L) 4.22 - 5.81 MIL/uL   Hemoglobin 13.8 13.0 - 17.0 g/dL   HCT 40.2 39.0 - 52.0 %   MCV 100.0 78.0 - 100.0 fL   MCH 34.3 (H) 26.0 - 34.0 pg   MCHC 34.3 30.0 - 36.0 g/dL   RDW 13.6 11.5 - 15.5 %   Platelets 62 (L) 150 - 400 K/uL    Comment: CONSISTENT WITH PREVIOUS RESULT  Comprehensive metabolic panel     Status: Abnormal   Collection Time: 05/15/15  4:15 AM  Result Value  Ref Range   Sodium 139 135 - 145 mmol/L   Potassium 4.8 3.5 - 5.1 mmol/L   Chloride 109 101 - 111 mmol/L   CO2 21 (L) 22 - 32 mmol/L   Glucose, Bld 175 (H) 65 - 99 mg/dL   BUN 16 6 - 20 mg/dL   Creatinine, Ser 0.83 0.61 - 1.24 mg/dL   Calcium 8.8 (L) 8.9 - 10.3 mg/dL   Total Protein 6.7 6.5 - 8.1 g/dL   Albumin 3.2 (L) 3.5 - 5.0 g/dL   AST 85 (H) 15 - 41 U/L   ALT 44 17 - 63 U/L   Alkaline Phosphatase 93 38 - 126 U/L   Total Bilirubin 2.4 (H) 0.3 - 1.2 mg/dL   GFR calc non Af Amer >60 >60 mL/min   GFR calc Af Amer >60 >60 mL/min    Comment: (NOTE) The eGFR has been calculated using the CKD EPI equation. This calculation has not been validated in all clinical situations. eGFR's persistently <60 mL/min signify possible Chronic Kidney Disease.    Anion gap 9 5 - 15  Protime-INR     Status: Abnormal   Collection Time: 05/15/15  4:15 AM  Result Value Ref Range   Prothrombin Time 17.4 (H) 11.6 - 15.2 seconds   INR 1.42 0.00 - 1.49  Ammonia     Status: Abnormal   Collection Time: 05/15/15  4:15 AM  Result Value Ref Range   Ammonia 88 (H) 9 - 35 umol/L  Lactic acid, plasma     Status: Abnormal   Collection Time: 05/15/15  4:15 AM  Result Value Ref Range   Lactic Acid, Venous 2.5 (HH) 0.5 - 2.0 mmol/L    Comment: CRITICAL RESULT CALLED TO, READ BACK BY AND VERIFIED WITH: M.BARHAM,RN AT 0459 ON 05/15/15 BY W.SHEA   Glucose, capillary     Status: Abnormal   Collection Time: 05/15/15  8:00 AM  Result Value Ref Range   Glucose-Capillary 156 (H) 65 - 99 mg/dL   Comment 1 Notify RN    Comment 2 Document in Chart    _0 (sdes,specrequest,cult,reptstatus)   ) Recent Results (from the past 720 hour(s))  Respiratory virus panel     Status: None   Collection Time: 04/15/15 12:54 PM  Result Value Ref Range Status   Respiratory Syncytial Virus A Negative Negative Final   Respiratory Syncytial Virus B Negative Negative Final   Influenza A Negative Negative Final    Influenza B Negative Negative Final   Parainfluenza 1 Negative Negative Final   Parainfluenza 2 Negative Negative Final   Parainfluenza 3 Negative Negative Final   Metapneumovirus Negative Negative Final   Rhinovirus Negative Negative Final   Adenovirus Negative Negative Final    Comment: (NOTE) Performed At: [facility] [place], [place] [id number] Lindon Romp MD OQ:[id number]   Culture, blood (routine x 2)     Status: None   Collection Time: 04/17/15 12:25 PM  Result Value Ref Range Status   Specimen Description BLOOD BLOOD LEFT HAND  Final   Special Requests BOTTLES DRAWN AEROBIC AND ANAEROBIC 5CC  Final   Culture NO GROWTH 5 DAYS  Final   Report Status 04/22/2015 FINAL  Final  Culture, blood (routine x 2)     Status: None   Collection Time: 04/17/15 12:30 PM  Result Value Ref Range Status   Specimen Description BLOOD BLOOD RIGHT HAND  Final   Special Requests IN PEDIATRIC BOTTLE 4CC  Final   Culture NO GROWTH 5 DAYS  Final   Report Status 04/22/2015 FINAL  Final  Blood Culture (routine x 2)     Status: None   Collection Time: 05/02/15  7:49 PM  Result Value Ref Range Status   Specimen Description BLOOD PICC LINE  Final   Special Requests BOTTLES DRAWN AEROBIC AND ANAEROBIC 4CC  Final   Culture NO GROWTH 5 DAYS  Final   Report Status 05/07/2015 FINAL  Final  Blood Culture (routine x 2)     Status: None   Collection Time: 05/02/15  7:49 PM  Result Value Ref Range Status   Specimen Description BLOOD LEFT HAND  Final   Special Requests BOTTLES DRAWN AEROBIC ONLY 5CC  Final   Culture NO GROWTH 5 DAYS  Final   Report Status 05/07/2015 FINAL  Final  Urine culture     Status: None   Collection Time: 05/02/15 11:01 PM  Result Value Ref Range Status   Specimen Description URINE, RANDOM  Final   Special Requests NONE  Final   Culture 5,000 COLONIES/mL INSIGNIFICANT GROWTH  Final   Report Status 05/04/2015 FINAL  Final  MRSA PCR Screening     Status:  None   Collection Time: 05/07/15  2:30 PM  Result Value Ref Range Status   MRSA by PCR NEGATIVE NEGATIVE Final    Comment:        The GeneXpert MRSA Assay (FDA approved for NASAL specimens only), is one component of a comprehensive MRSA colonization surveillance program. It is not intended to diagnose MRSA infection nor to guide or monitor treatment for MRSA infections.   MRSA PCR Screening     Status: None   Collection Time: 05/14/15 11:09 PM  Result Value Ref Range Status   MRSA by PCR NEGATIVE NEGATIVE Final    Comment:        The GeneXpert MRSA Assay (FDA approved for NASAL specimens only), is one component of a comprehensive MRSA colonization surveillance program. It is not intended to diagnose MRSA infection nor to guide or monitor treatment for MRSA infections.      Impression/Recommendation  Principal Problem:   Hepatic encephalopathy (HCC) Active Problems:   CAD, NATIVE VESSEL   DIASTOLIC HEART FAILURE, CHRONIC   Cryptogenic cirrhosis (HCC)   Type 2 diabetes mellitus, uncontrolled (HCC)   PFO (patent foramen ovale): Per TEE 04/20/2015   Thrombocytopenia (HCC)   Adam Butler is a 59 y.o. male with Cryptogenic cirrhosis, type 2 MD uncontrolled, with recent Viridans streptococcal bacteremia with negative TEE sp 3 weeks of IV abx now with admission with encephalopathy and fever  #1 Fever with encephalopathy  --I put in order for US guided paracentesis to see if he is having episode of SBP and have fluid sent  for cell count and diff, protein, glucose, GS, cultures --followup blood cultures  --no need to start antibiotics at this point  #2 Encephalopathy: agree him made need placement as not able to control his encephalopathy at home, is he not being compliant?  Dr. Megan Salon is covering this weekend.   05/15/2015, 11:21 AM   Thank you so much for this interesting consult  [facility] [phone] (pager) [phone] (office) 05/15/2015, 11:21 AM  Rhina Brackett [place] 05/15/2015, 11:21 AM

## 2015-05-16 DIAGNOSIS — R509 Fever, unspecified: Secondary | ICD-10-CM

## 2015-05-16 DIAGNOSIS — K7200 Acute and subacute hepatic failure without coma: Principal | ICD-10-CM

## 2015-05-16 LAB — URINE CULTURE: Culture: NO GROWTH

## 2015-05-16 LAB — CBC
HCT: 38.6 % — ABNORMAL LOW (ref 39.0–52.0)
Hemoglobin: 13.2 g/dL (ref 13.0–17.0)
MCH: 34.5 pg — ABNORMAL HIGH (ref 26.0–34.0)
MCHC: 34.2 g/dL (ref 30.0–36.0)
MCV: 100.8 fL — AB (ref 78.0–100.0)
PLATELETS: 41 10*3/uL — AB (ref 150–400)
RBC: 3.83 MIL/uL — AB (ref 4.22–5.81)
RDW: 13.8 % (ref 11.5–15.5)
WBC: 5.2 10*3/uL (ref 4.0–10.5)

## 2015-05-16 LAB — COMPREHENSIVE METABOLIC PANEL
ALK PHOS: 75 U/L (ref 38–126)
ALT: 46 U/L (ref 17–63)
ANION GAP: 3 — AB (ref 5–15)
AST: 89 U/L — ABNORMAL HIGH (ref 15–41)
Albumin: 2.8 g/dL — ABNORMAL LOW (ref 3.5–5.0)
BUN: 15 mg/dL (ref 6–20)
CALCIUM: 8.3 mg/dL — AB (ref 8.9–10.3)
CO2: 24 mmol/L (ref 22–32)
Chloride: 110 mmol/L (ref 101–111)
Creatinine, Ser: 0.85 mg/dL (ref 0.61–1.24)
GFR calc non Af Amer: >60 mL/min (ref >60)
Glucose, Bld: 169 mg/dL — ABNORMAL HIGH (ref 65–99)
Potassium: 3.8 mmol/L (ref 3.5–5.1)
SODIUM: 137 mmol/L (ref 135–145)
Total Bilirubin: 1.6 mg/dL — ABNORMAL HIGH (ref 0.3–1.2)
Total Protein: 6.2 g/dL — ABNORMAL LOW (ref 6.5–8.1)

## 2015-05-16 LAB — MAGNESIUM: MAGNESIUM: 1.8 mg/dL (ref 1.7–2.4)

## 2015-05-16 LAB — GLUCOSE, CAPILLARY
GLUCOSE-CAPILLARY: 182 mg/dL — AB (ref 65–99)
Glucose-Capillary: 143 mg/dL — ABNORMAL HIGH (ref 65–99)
Glucose-Capillary: 205 mg/dL — ABNORMAL HIGH (ref 65–99)
Glucose-Capillary: 212 mg/dL — ABNORMAL HIGH (ref 65–99)

## 2015-05-16 LAB — HEMOGLOBIN A1C
Hgb A1c MFr Bld: 6.9 % — ABNORMAL HIGH (ref 4.8–5.6)
MEAN PLASMA GLUCOSE: 151 mg/dL

## 2015-05-16 NOTE — Progress Notes (Signed)
Patient ID: Adam Butler, male   DOB: 05-Feb-1956, 59 y.o.   MRN: [id number]         [facility] for Infectious Disease    Date of Admission:  05/14/2015     Principal Problem:   Hepatic encephalopathy (HCC) Active Problems:   CAD, NATIVE VESSEL   DIASTOLIC HEART FAILURE, CHRONIC   Cryptogenic cirrhosis (HCC)   Type 2 diabetes mellitus, uncontrolled (HCC)   PFO (patent foramen ovale): Per TEE 04/20/2015   Thrombocytopenia (HCC)   FUO (fever of unknown origin)   . antiseptic oral rinse  7 mL Mouth Rinse q12n4p  . chlorhexidine  15 mL Mouth Rinse BID  . insulin aspart  0-9 Units Subcutaneous TID WC  . lactulose  30 g Oral TID  . rifaximin  550 mg Oral BID  . sodium chloride  3 mL Intravenous Q12H    SUBJECTIVE: He states that he is feeling better. He denies having any pain. His wife notes dramatic improvement in his mental status overnight. She was not aware of him having any fever prior to admission. He was only having loose bowel movements after he took his lactulose. He finished his IV antibiotics for viridans streptococcal bacteremia around 05/04/2015. He did resume taking oral trimethoprim sulfamethoxazole as instructed on his previous discharge summary. His wife notes that he has been on that for about 6 months but does not know why it was started by his primary care physician. He has also been on rifaximin.  Review of Systems: Pertinent items are noted in HPI.  Past Medical History  Diagnosis Date  . Cirrhosis ([place]) 2011    Cryptogenic, Likely NASH. Family/pt deny EtOH. HCV, HBV, HAV negative. ANA negative. AMA positive. Ascites 12/11  . Hyperlipemia   . Coronary artery disease     Inferior MI 12/11; LHC with occluded mid CFX and 80% proximal RCA. EF 55%. He had 3.0 x 28 vision BMS to CFX  . Diastolic CHF, acute ([place])     Echo 12/11 with ef 50-55% and mild LVH. EF 55% by LV0gram in 12/11  . Hypertension   . Type II diabetes mellitus ([place]) 2011  . SVT  (supraventricular tachycardia) ([place])     1/12: appeared to be an ectopic atrial tachycardia. Required DCCV with hemodynamic instability  . GI bleed     12/11: Etiology not clearly defined. EGD: nonbleeding esophageal varices.  . S/P coronary artery stent placement 06/2010  . Esophageal varices ([place]) 2011, 2013    no hx acute variceal bleed  . Hepatic encephalopathy ([place]) 2011, 12/2013  . Myocardial infarction [facility]) ? 2012  . Shortness of breath dyspnea   . Arthritis   . Hx of echocardiogram     Echo 5/16:  EF 55-60%, no RWMA, mod LAE  . Thrombocytopenia ([place])   . Cholelithiasis   . Rectal varices   . PFO (patent foramen ovale): Per TEE 04/20/2015 04/20/2015  . Bacteremia     Social History  Substance Use Topics  . Smoking status: Current Every Day Smoker -- 1.00 packs/day for 36 years    Types: E-cigarettes  . Smokeless tobacco: Current User     Comment: uses vapor cigarettes (2016 )  . Alcohol Use: No    Family History  Problem Relation Age of Onset  . Heart attack Brother 46    MI  . Diabetes Brother   . Heart attack Father   . Diabetes Father   . COPD Father   . COPD Mother   .  Stroke Neg Hx   . Heart attack Brother    Allergies  Allergen Reactions  . Isosorbide Other (See Comments)    Nose bleeds  . Lactose Intolerance (Gi) Other (See Comments)    OBJECTIVE: Filed Vitals:   05/16/15 0600 05/16/15 0800 05/16/15 1000 05/16/15 1150  BP: 156/68 139/54 139/48   Pulse: 84 81 92   Temp: 100 F (37.8 C) 100.2 F (37.9 C) 100.2 F (37.9 C) 100.4 F (38 C)  TempSrc:  Core (Comment) Core (Comment) Core (Comment)  Resp: 25 20 16    Height:      Weight:      SpO2: 95% 95% 96%    Body mass index is 41.71 kg/(m^2).  General: Alert and pleasant but still mildly confused  Skin: No rash Lungs: Clear Cor: Regular S1 and S2 with a 2/6 systolic murmur heard best at the right upper sternal border Abdomen: Obese, soft and nontender Joints and extremities: No acute  abnormalities  Lab Results Lab Results  Component Value Date   WBC 5.2 05/16/2015   HGB 13.2 05/16/2015   HCT 38.6* 05/16/2015   MCV 100.8* 05/16/2015   PLT 41* 05/16/2015    Lab Results  Component Value Date   CREATININE 0.85 05/16/2015   BUN 15 05/16/2015   NA 137 05/16/2015   K 3.8 05/16/2015   CL 110 05/16/2015   CO2 24 05/16/2015    Lab Results  Component Value Date   ALT 46 05/16/2015   AST 89* 05/16/2015   ALKPHOS 75 05/16/2015   BILITOT 1.6* 05/16/2015     Microbiology: Recent Results (from the past 240 hour(s))  MRSA PCR Screening     Status: None   Collection Time: 05/07/15  2:30 PM  Result Value Ref Range Status   MRSA by PCR NEGATIVE NEGATIVE Final    Comment:        The GeneXpert MRSA Assay (FDA approved for NASAL specimens only), is one component of a comprehensive MRSA colonization surveillance program. It is not intended to diagnose MRSA infection nor to guide or monitor treatment for MRSA infections.   MRSA PCR Screening     Status: None   Collection Time: 05/14/15 11:09 PM  Result Value Ref Range Status   MRSA by PCR NEGATIVE NEGATIVE Final    Comment:        The GeneXpert MRSA Assay (FDA approved for NASAL specimens only), is one component of a comprehensive MRSA colonization surveillance program. It is not intended to diagnose MRSA infection nor to guide or monitor treatment for MRSA infections.   Culture, Urine     Status: None   Collection Time: 05/15/15  6:43 AM  Result Value Ref Range Status   Specimen Description URINE, CATHETERIZED  Final   Special Requests NONE  Final   Culture   Final    NO GROWTH 1 DAY Performed at [facility]    Report Status 05/16/2015 FINAL  Final  Culture, blood (routine x 2)     Status: None (Preliminary result)   Collection Time: 05/15/15  7:30 AM  Result Value Ref Range Status   Specimen Description BLOOD RIGHT HAND  Final   Special Requests BOTTLES DRAWN AEROBIC AND ANAEROBIC 6CC   Final   Culture   Final    NO GROWTH < 24 HOURS Performed at [facility]    Report Status PENDING  Incomplete  Culture, blood (routine x 2)     Status: None (Preliminary result)  Collection Time: 05/15/15  7:35 AM  Result Value Ref Range Status   Specimen Description BLOOD RIGHT HAND  Final   Special Requests IN PEDIATRIC BOTTLE 2CC  Final   Culture   Final    NO GROWTH < 24 HOURS Performed at [facility]    Report Status PENDING  Incomplete     ASSESSMENT: His fever is down so far today and he is much less confused than on admission. So far the source of his fever remains unclear. He has no pneumonia, urinary tract infection, new or worsening diarrhea, or ascites. I will continue observation off of antibiotics. It is possible that he could have had an allergic drug fever due to the trimethoprim sulfamethoxazole.  PLAN: 1. Observe off of antibiotics (other than his chronic rifaximin) 2. Await blood culture results  Michel Bickers, MD [facility] for [place] [phone] pager   [phone] cell 05/16/2015, 1:13 PM

## 2015-05-16 NOTE — Progress Notes (Signed)
[facility] PROGRESS NOTE  Adam Butler YDX:[id number] DOB: 1955-11-13 DOA: 05/14/2015 PCP: [name], NP  Assessment/Plan: 1. Encephalopathy- resolved , likely from hepatic encephalopathy. ammonia level has come down to 88, mentally patient is back to his baseline. He is alert and oriented 3. Infectious disease consulted ultrasound-guided paracentesis ordered to rule out SBP, no ascitic fluid could be obtained.. No antibiotics recommended at this time by ID. We'll follow the culture results. Continue lactulose and rifaximin. 2. Recent strep viridans bacteremia- patient was treated with IV antibiotics for 3 weeks per infectious disease. No further antibiotics as per ID. 3. Fever- patient continues to have fever last temp 100.4. No sign symptoms of infection, WBC is normal. Will check CBC in a.m. Follow the blood culture results. 4.  Diabetes mellitus- start sliding scale insulin with NovoLog. Continue to hold the oral hypoglycemic agents 5. Thrombocytopenia- likely from the cryptogenic liver cirrhosis, check CBC in a.m. 6. Hypomagnesemia- magnesium level was I.6, replaced magnesium and today the level is 1.8.  Code 3  Status:  Full code Family Communication: no family at bedside Disposition Plan: Skilled nursing facility   Consultants:  Infectious disease  Procedures:  None  Antibiotics:  None  HPI/Subjective: 59 y.o. male with a past medical history of cryptogenic liver cirrhosis, hyperlipidemia, CAD, diastolic CHF, hypertension, type 2 diabetes, SVT, with multiple episodes of hepatic encephalopathy secondary to hyperammonemia who is brought to the emergency department after his wife has noticed a 1 day history of worsening mental status, decreased alertness and confusion, which she describes as usual symptoms of hepatic encephalopathy for this patient. She denies witnessing fever, dyspnea, nausea or emesis, diarrhea or urinary symptoms. She is states that recently his  lactulose was increased to 3 times a day and the patient last bowel movement was yesterday.   This morning patient is back to his normal baseline. Alert and oriented 3.  Objective: Filed Vitals:   05/16/15 1150  BP:   Pulse:   Temp: 100.4 F (38 C)  Resp:     Intake/Output Summary (Last 24 hours) at 05/16/15 1337 Last data filed at 05/16/15 1225  Gross per 24 hour  Intake   2166 ml  Output    545 ml  Net   1621 ml   Filed Weights   05/14/15 2200 05/16/15 0514  Weight: 125.2 kg (276 lb 0.3 oz) 124.4 kg (274 lb 4 oz)    Exam:   General:  Appears in no acute distress  Cardiovascular: S1S2 RRR  Respiratory: Clear bilaterally  Abdomen: Soft, nontender, nondistended  Musculoskeletal: No cyanosis/clubbing/edema  Data Reviewed: Basic Metabolic Panel:  Recent Labs Lab 05/14/15 1603 05/15/15 0410 05/15/15 0415 05/16/15 0350  NA 139  --  139 137  K 4.4  --  4.8 3.8  CL 107  --  109 110  CO2 25  --  21* 24  GLUCOSE 134*  --  175* 169*  BUN 13  --  16 15  CREATININE 0.89  --  0.83 0.85  CALCIUM 8.9  --  8.8* 8.3*  MG  --  1.6*  --  1.8  PHOS  --  3.4  --   --    Liver Function Tests:  Recent Labs Lab 05/14/15 1603 05/15/15 0415 05/16/15 0350  AST 72* 85* 89*  ALT 45 44 46  ALKPHOS 131* 93 75  BILITOT 1.8* 2.4* 1.6*  PROT 7.0 6.7 6.2*  ALBUMIN 3.2* 3.2* 2.8*   No results for input(s): LIPASE, AMYLASE  in the last 168 hours.  Recent Labs Lab 05/14/15 1603 05/15/15 0415  AMMONIA 122* 88*   CBC:  Recent Labs Lab 05/14/15 1603 05/15/15 0415 05/16/15 0350  WBC 5.6 7.0 5.2  NEUTROABS 4.2  --   --   HGB 14.1 13.8 13.2  HCT 41.0 40.2 38.6*  MCV 101.5* 100.0 100.8*  PLT 63* 62* 41*   Cardiac Enzymes: No results for input(s): CKTOTAL, CKMB, CKMBINDEX, TROPONINI in the last 168 hours. BNP (last 3 results)  Recent Labs  10/14/14 2248  BNP 56.1    ProBNP (last 3 results)  Recent Labs  12/11/14 0913  PROBNP 38.0    CBG:  Recent  Labs Lab 05/15/15 1206 05/15/15 1647 05/15/15 2155 05/16/15 0701 05/16/15 1105  GLUCAP 201* 158* 163* 143* 205*    Recent Results (from the past 240 hour(s))  MRSA PCR Screening     Status: None   Collection Time: 05/07/15  2:30 PM  Result Value Ref Range Status   MRSA by PCR NEGATIVE NEGATIVE Final    Comment:        The GeneXpert MRSA Assay (FDA approved for NASAL specimens only), is one component of a comprehensive MRSA colonization surveillance program. It is not intended to diagnose MRSA infection nor to guide or monitor treatment for MRSA infections.   MRSA PCR Screening     Status: None   Collection Time: 05/14/15 11:09 PM  Result Value Ref Range Status   MRSA by PCR NEGATIVE NEGATIVE Final    Comment:        The GeneXpert MRSA Assay (FDA approved for NASAL specimens only), is one component of a comprehensive MRSA colonization surveillance program. It is not intended to diagnose MRSA infection nor to guide or monitor treatment for MRSA infections.   Culture, Urine     Status: None   Collection Time: 05/15/15  6:43 AM  Result Value Ref Range Status   Specimen Description URINE, CATHETERIZED  Final   Special Requests NONE  Final   Culture   Final    NO GROWTH 1 DAY Performed at [facility]    Report Status 05/16/2015 FINAL  Final  Culture, blood (routine x 2)     Status: None (Preliminary result)   Collection Time: 05/15/15  7:30 AM  Result Value Ref Range Status   Specimen Description BLOOD RIGHT HAND  Final   Special Requests BOTTLES DRAWN AEROBIC AND ANAEROBIC [place]  Final   Culture   Final    NO GROWTH < 24 HOURS Performed at [facility]    Report Status PENDING  Incomplete  Culture, blood (routine x 2)     Status: None (Preliminary result)   Collection Time: 05/15/15  7:35 AM  Result Value Ref Range Status   Specimen Description BLOOD RIGHT HAND  Final   Special Requests IN PEDIATRIC BOTTLE [place]  Final   Culture   Final    NO  GROWTH < 24 HOURS Performed at [facility]    Report Status PENDING  Incomplete     Studies: US Abdomen Limited  05/15/2015  CLINICAL DATA:  Cirrhosis, encephalopathy, fever. Request is made for paracentesis. EXAM: LIMITED ABDOMEN ULTRASOUND FOR ASCITES TECHNIQUE: Limited ultrasound survey for ascites was performed in all four abdominal quadrants.There is no significant ascites present on today's exam. COMPARISON:  None. FINDINGS: No significant ascites present on limited ultrasound of abdomen in all 4 quadrants IMPRESSION: No significant ascites.  Paracentesis not performed. Read by: Lennette Bihari  Allred, PA-C Electronically Signed   By: Markus Daft M.D.   On: 05/15/2015 15:04   Dg Chest Port 1 View  05/15/2015  CLINICAL DATA:  Hepatic encephalopathy.  Fever. EXAM: PORTABLE CHEST 1 VIEW COMPARISON:  05/07/2015 FINDINGS: A single AP portable view of the chest demonstrates no focal airspace consolidation or alveolar edema. The lungs are grossly clear. There is no large effusion or pneumothorax. Cardiac and mediastinal contours appear unremarkable. IMPRESSION: No active disease. Electronically Signed   By: Andreas Newport M.D.   On: 05/15/2015 06:34    Scheduled Meds: . antiseptic oral rinse  7 mL Mouth Rinse q12n4p  . chlorhexidine  15 mL Mouth Rinse BID  . insulin aspart  0-9 Units Subcutaneous TID WC  . lactulose  30 g Oral TID  . rifaximin  550 mg Oral BID  . sodium chloride  3 mL Intravenous Q12H   Continuous Infusions: . sodium chloride 50 mL/hr (05/15/15 1336)    Principal Problem:   Hepatic encephalopathy (HCC) Active Problems:   CAD, NATIVE VESSEL   DIASTOLIC HEART FAILURE, CHRONIC   Cryptogenic cirrhosis (HCC)   Type 2 diabetes mellitus, uncontrolled (HCC)   PFO (patent foramen ovale): Per [facility]EE 04/20/2015   Thrombocytopenia (HCC)   FUO (fever of unknown origin)    Time spent: 25 min    [facility] Pager [phone]*. If 7PM-7AM, please contact  night-coverage at [email or web address], password [facility] 05/16/2015, 1:37 PM  LOS: 2 days

## 2015-05-17 DIAGNOSIS — I5032 Chronic diastolic (congestive) heart failure: Secondary | ICD-10-CM

## 2015-05-17 DIAGNOSIS — K7469 Other cirrhosis of liver: Secondary | ICD-10-CM

## 2015-05-17 LAB — CBC
HEMATOCRIT: 37.3 % — AB (ref 39.0–52.0)
Hemoglobin: 12.5 g/dL — ABNORMAL LOW (ref 13.0–17.0)
MCH: 33.9 pg (ref 26.0–34.0)
MCHC: 33.5 g/dL (ref 30.0–36.0)
MCV: 101.1 fL — AB (ref 78.0–100.0)
PLATELETS: 40 10*3/uL — AB (ref 150–400)
RBC: 3.69 MIL/uL — ABNORMAL LOW (ref 4.22–5.81)
RDW: 13.7 % (ref 11.5–15.5)
WBC: 4.7 10*3/uL (ref 4.0–10.5)

## 2015-05-17 LAB — COMPREHENSIVE METABOLIC PANEL
ALT: 47 U/L (ref 17–63)
AST: 97 U/L — ABNORMAL HIGH (ref 15–41)
Albumin: 2.8 g/dL — ABNORMAL LOW (ref 3.5–5.0)
Alkaline Phosphatase: 91 U/L (ref 38–126)
Anion gap: 7 (ref 5–15)
BILIRUBIN TOTAL: 1.5 mg/dL — AB (ref 0.3–1.2)
BUN: 11 mg/dL (ref 6–20)
CHLORIDE: 106 mmol/L (ref 101–111)
CO2: 20 mmol/L — ABNORMAL LOW (ref 22–32)
CREATININE: 0.6 mg/dL — AB (ref 0.61–1.24)
Calcium: 8.3 mg/dL — ABNORMAL LOW (ref 8.9–10.3)
Glucose, Bld: 169 mg/dL — ABNORMAL HIGH (ref 65–99)
POTASSIUM: 3.4 mmol/L — AB (ref 3.5–5.1)
Sodium: 133 mmol/L — ABNORMAL LOW (ref 135–145)
TOTAL PROTEIN: 6.1 g/dL — AB (ref 6.5–8.1)

## 2015-05-17 LAB — GLUCOSE, CAPILLARY: GLUCOSE-CAPILLARY: 132 mg/dL — AB (ref 65–99)

## 2015-05-17 MED ORDER — POTASSIUM CHLORIDE CRYS ER 20 MEQ PO TBCR
40.0000 meq | EXTENDED_RELEASE_TABLET | Freq: Once | ORAL | Status: AC
  Administered 2015-05-17: 40 meq via ORAL
  Filled 2015-05-17: qty 2

## 2015-05-17 NOTE — Discharge Summary (Addendum)
Physician Discharge Summary  Adam Butler ZOX:[id number] DOB: Nov 19, 1955 DOA: 05/14/2015  PCP: [name], NP  Admit date: 05/14/2015 Discharge date: 05/17/2015  Time spent: 25* minutes  Recommendations for Outpatient Follow-up:  1. Follow up PCP in 2 weeks  Discharge Diagnoses:  Principal Problem:   Hepatic encephalopathy ([place]) Active Problems:   CAD, NATIVE VESSEL   DIASTOLIC HEART FAILURE, CHRONIC   Cryptogenic cirrhosis (HCC)   Type 2 diabetes mellitus, uncontrolled (HCC)   PFO (patent foramen ovale): Per [facility]EE 04/20/2015   Thrombocytopenia (HCC)   FUO (fever of unknown origin)   Discharge Condition: Stable  Diet recommendation: Low salt diet  Filed Weights   05/14/15 2200 05/16/15 0514 05/17/15 0000  Weight: 125.2 kg (276 lb 0.3 oz) 124.4 kg (274 lb 4 oz) 124.4 kg (274 lb 4 oz)    History of present illness:  59 y.o. male with a past medical history of cryptogenic liver cirrhosis, hyperlipidemia, CAD, diastolic CHF, hypertension, type 2 diabetes, SVT, with multiple episodes of hepatic encephalopathy secondary to hyperammonemia who is brought to the emergency department after his wife has noticed a 1 day history of worsening mental status, decreased alertness and confusion, which she describes as usual symptoms of hepatic encephalopathy for this patient. She denies witnessing fever, dyspnea, nausea or emesis, diarrhea or urinary symptoms. She is states that recently his lactulose was increased to 3 times a day and the patient last bowel movement was yesterday.   Hospital Course:  1. Encephalopathy- resolved , likely from hepatic encephalopathy. ammonia level has come down to 88, mentally patient is back to his baseline. He is alert and oriented 3. Infectious disease consulted ultrasound-guided paracentesis ordered to rule out SBP, no ascitic fluid could be obtained.. No antibiotics recommended at this time by ID. As the fever has resolved, ID recommends to discharge home,  no further evaluation. . Continue lactulose and rifaximin. 2. Recent strep viridans bacteremia- patient was treated with IV antibiotics for 3 weeks per infectious disease. No further antibiotics as per ID. 3. Fever- resolved, patient continued to have fever lin the hospital. No sign symptoms of infection, UA and CXR are clear, no antibiotics as per ID. Will stop Bactrim he was taking at home, which may have caused drug fever. 4. Diabetes mellitus-  Continue  the oral hypoglycemic agents 5. Thrombocytopenia- likely from the cryptogenic liver cirrhosis, 6. Hypomagnesemia- magnesium level was I.6, replaced magnesium and repeat level is  is 1.8. 7. Hypokalemia- replace potassium before discharge.  Procedures:  None   Consultations:  ID  Discharge Exam: Filed Vitals:   05/17/15 1000  BP: 140/83  Pulse: 104  Temp:   Resp: 19    General: Appears in no acute distress Cardiovascular: S1S2 RRR Respiratory: Clear bilaterally  Discharge Instructions   Discharge Instructions    Diet - low sodium heart healthy    Complete by:  As directed      Increase activity slowly    Complete by:  As directed           Current Discharge Medication List    CONTINUE these medications which have NOT CHANGED   Details  aspirin 81 MG tablet Take 81 mg by mouth daily.     atorvastatin (LIPITOR) 10 MG tablet Take 10 mg by mouth daily at 6 PM.    furosemide (LASIX) 40 MG tablet Take 40 mg by mouth daily.     insulin NPH Human (HUMULIN N,NOVOLIN N) 100 UNIT/ML injection Inject 0.35-0.8 mLs (35-80 Units  total) into the skin See admin instructions. Take 80 units in the morning and 35 units in the evening. Qty: 10 mL, Refills: 0    lactulose (CHRONULAC) 10 GM/15ML solution Take 30 g by mouth 3 (three) times daily. May take an additional dose as needed for bowel movement    metFORMIN (GLUCOPHAGE) 1000 MG tablet Take 1,000 mg by mouth 2 (two) times daily. Refills: 11    Multiple Vitamin  (MULTIVITAMIN) capsule Take 1 capsule by mouth daily.      nadolol (CORGARD) 40 MG tablet Take 1 tablet (40 mg total) by mouth daily. Qty: 30 tablet, Refills: 5   Associated Diagnoses: Encephalopathy, hepatic ([place]); Liver cirrhosis secondary to nonalcoholic steatohepatitis ([place])    nitroGLYCERIN (NITROSTAT) 0.4 MG SL tablet Place 0.4 mg under the tongue every 5 (five) minutes as needed for chest pain (chest pain).    omeprazole (PRILOSEC) 20 MG capsule Take 1 capsule (20 mg total) by mouth daily. Qty: 30 capsule, Refills: 6    ranolazine (RANEXA) 500 MG 12 hr tablet Take 2 tablets (1,000 mg total) by mouth 2 (two) times daily. Qty: 60 tablet, Refills: 11   Associated Diagnoses: Coronary artery disease involving native coronary artery of native heart with angina pectoris (HCC)    rifaximin (XIFAXAN) 550 MG TABS tablet Take 1 tablet (550 mg total) by mouth 2 (two) times daily. Qty: 24 tablet, Refills: 0    spironolactone (ALDACTONE) 25 MG tablet TAKE 1 TABLET EVERY DAY Qty: 30 tablet, Refills: 11      STOP taking these medications     sulfamethoxazole-trimethoprim (BACTRIM DS,SEPTRA DS) 800-160 MG tablet        Allergies  Allergen Reactions  . Isosorbide Other (See Comments)    Nose bleeds  . Lactose Intolerance (Gi) Other (See Comments)      The results of significant diagnostics from this hospitalization (including imaging, microbiology, ancillary and laboratory) are listed below for reference.    Significant Diagnostic Studies: Dg Chest 2 View  05/02/2015  CLINICAL DATA:  Altered mental status. History of healthcare acquired pneumonia. EXAM: CHEST  2 VIEW COMPARISON:  04/14/2015 FINDINGS: Tip of the right upper extremity PICC in the mid proximal SVC cardiomediastinal contours are unchanged, heart at the upper limits of normal in size. Improving pulmonary edema with persistent vascular congestion. No confluent airspace disease, pleural effusion or pneumothorax. Degenerative  change in the thoracic spine. IMPRESSION: 1. No acute process. 2. Tip of the right upper extremity PICC in the SVC. Electronically Signed   By: Jeb Levering M.D.   On: 05/02/2015 20:08   Ct Head Wo Contrast  05/07/2015  CLINICAL DATA:  Altered mental status. Found unresponsive this morning. History of nonalcoholic cirrhosis and hepatic encephalopathy. EXAM: CT HEAD WITHOUT CONTRAST TECHNIQUE: Contiguous axial images were obtained from the base of the skull through the vertex without intravenous contrast. COMPARISON:  04/14/2015 FINDINGS: There is no evidence of acute cortical infarct, intracranial hemorrhage, mass, midline shift, or extra-axial fluid collection. Ventricles and sulci are normal. Patchy hypodensities in the subcortical deep cerebral white matter bilaterally are similar to the prior study and nonspecific but compatible with mild chronic small vessel ischemic disease. Small, chronic right occipital lobe cortical infarct is unchanged. Prior right cataract extraction is noted. Paranasal sinuses and mastoid air cells are clear. Right maxillary sinus is small. Calcified atherosclerosis is noted at the skullbase. No skull fracture is identified. IMPRESSION: 1. No evidence of acute intracranial abnormality. 2. Chronic ischemic change. Electronically Signed  By: Logan Bores M.D.   On: 05/07/2015 12:04   US Abdomen Complete  04/18/2015  CLINICAL DATA:  Acute onset of bacteremia. Ascites. Initial encounter. EXAM: ULTRASOUND ABDOMEN COMPLETE COMPARISON:  CT of the abdomen and pelvis performed 04/14/2015 FINDINGS: Gallbladder: A few stones are seen within the gallbladder. The gallbladder is otherwise unremarkable. No significant gallbladder wall thickening or pericholecystic fluid is seen. No ultrasonographic Murphy's sign is elicited. Common bile duct: Diameter: 0.7 cm, borderline prominent. Liver: No focal lesion identified. Within normal limits in parenchymal echogenicity. Difficult to fully  characterize due to the patient's habitus. IVC: No abnormality visualized. Pancreas: Not visualized. Spleen: Size and appearance within normal limits. Right Kidney: Length: 12.2 cm. Echogenicity within normal limits. No mass or hydronephrosis visualized. Left Kidney: Length: 11.6 cm. Echogenicity within normal limits. No mass or hydronephrosis visualized. Abdominal aorta: No aneurysm visualized. Other findings: None. IMPRESSION: 1. No acute abnormality seen within the abdomen. Evaluation suboptimal due to the patient's habitus. 2. Cholelithiasis. Gallbladder otherwise unremarkable. Slight prominence of the common bile duct likely remains within normal limits. Electronically Signed   By: Garald Balding M.D.   On: 04/18/2015 00:44   US Abdomen Limited  05/15/2015  CLINICAL DATA:  Cirrhosis, encephalopathy, fever. Request is made for paracentesis. EXAM: LIMITED ABDOMEN ULTRASOUND FOR ASCITES TECHNIQUE: Limited ultrasound survey for ascites was performed in all four abdominal quadrants.There is no significant ascites present on today's exam. COMPARISON:  None. FINDINGS: No significant ascites present on limited ultrasound of abdomen in all 4 quadrants IMPRESSION: No significant ascites.  Paracentesis not performed. Read by: Rowe Robert, PA-C Electronically Signed   By: Markus Daft M.D.   On: 05/15/2015 15:04   Dg Chest Port 1 View  05/15/2015  CLINICAL DATA:  Hepatic encephalopathy.  Fever. EXAM: PORTABLE CHEST 1 VIEW COMPARISON:  05/07/2015 FINDINGS: A single AP portable view of the chest demonstrates no focal airspace consolidation or alveolar edema. The lungs are grossly clear. There is no large effusion or pneumothorax. Cardiac and mediastinal contours appear unremarkable. IMPRESSION: No active disease. Electronically Signed   By: Andreas Newport M.D.   On: 05/15/2015 06:34   Dg Chest Port 1 View  05/07/2015  CLINICAL DATA:  Insert epic study EXAM: PORTABLE CHEST 1 VIEW COMPARISON:  05/02/2015 FINDINGS:  Shallow lung inflation. Heart size is normal. There are no focal consolidations or pleural effusions. Right-sided PICC line tip overlies the level of the upper superior vena cava. IMPRESSION: Shallow lung inflation. No evidence for acute cardio pulmonary abnormality. Electronically Signed   By: Nolon Nations M.D.   On: 05/07/2015 13:24    Microbiology: Recent Results (from the past 240 hour(s))  MRSA PCR Screening     Status: None   Collection Time: 05/07/15  2:30 PM  Result Value Ref Range Status   MRSA by PCR NEGATIVE NEGATIVE Final    Comment:        The GeneXpert MRSA Assay (FDA approved for NASAL specimens only), is one component of a comprehensive MRSA colonization surveillance program. It is not intended to diagnose MRSA infection nor to guide or monitor treatment for MRSA infections.   MRSA PCR Screening     Status: None   Collection Time: 05/14/15 11:09 PM  Result Value Ref Range Status   MRSA by PCR NEGATIVE NEGATIVE Final    Comment:        The GeneXpert MRSA Assay (FDA approved for NASAL specimens only), is one component of a comprehensive MRSA colonization surveillance program.  It is not intended to diagnose MRSA infection nor to guide or monitor treatment for MRSA infections.   Culture, Urine     Status: None   Collection Time: 05/15/15  6:43 AM  Result Value Ref Range Status   Specimen Description URINE, CATHETERIZED  Final   Special Requests NONE  Final   Culture   Final    NO GROWTH 1 DAY Performed at [facility]    Report Status 05/16/2015 FINAL  Final  Culture, blood (routine x 2)     Status: None (Preliminary result)   Collection Time: 05/15/15  7:30 AM  Result Value Ref Range Status   Specimen Description BLOOD RIGHT HAND  Final   Special Requests BOTTLES DRAWN AEROBIC AND ANAEROBIC 6CC  Final   Culture   Final    NO GROWTH 2 DAYS Performed at [facility]    Report Status PENDING  Incomplete  Culture, blood (routine x 2)      Status: None (Preliminary result)   Collection Time: 05/15/15  7:35 AM  Result Value Ref Range Status   Specimen Description BLOOD RIGHT HAND  Final   Special Requests IN PEDIATRIC BOTTLE [place]  Final   Culture   Final    NO GROWTH 2 DAYS Performed at [facility]    Report Status PENDING  Incomplete     Labs: Basic Metabolic Panel:  Recent Labs Lab 05/14/15 1603 05/15/15 0410 05/15/15 0415 05/16/15 0350 05/17/15 0337  NA 139  --  139 137 133*  K 4.4  --  4.8 3.8 3.4*  CL 107  --  109 110 106  CO2 25  --  21* 24 20*  GLUCOSE 134*  --  175* 169* 169*  BUN 13  --  16 15 11   CREATININE 0.89  --  0.83 0.85 0.60*  CALCIUM 8.9  --  8.8* 8.3* 8.3*  MG  --  1.6*  --  1.8  --   PHOS  --  3.4  --   --   --    Liver Function Tests:  Recent Labs Lab 05/14/15 1603 05/15/15 0415 05/16/15 0350 05/17/15 0337  AST 72* 85* 89* 97*  ALT 45 44 46 47  ALKPHOS 131* 93 75 91  BILITOT 1.8* 2.4* 1.6* 1.5*  PROT 7.0 6.7 6.2* 6.1*  ALBUMIN 3.2* 3.2* 2.8* 2.8*   No results for input(s): LIPASE, AMYLASE in the last 168 hours.  Recent Labs Lab 05/14/15 1603 05/15/15 0415  AMMONIA 122* 88*   CBC:  Recent Labs Lab 05/14/15 1603 05/15/15 0415 05/16/15 0350 05/17/15 0337  WBC 5.6 7.0 5.2 4.7  NEUTROABS 4.2  --   --   --   HGB 14.1 13.8 13.2 12.5*  HCT 41.0 40.2 38.6* 37.3*  MCV 101.5* 100.0 100.8* 101.1*  PLT 63* 62* 41* 40*   Cardiac Enzymes: No results for input(s): CKTOTAL, CKMB, CKMBINDEX, TROPONINI in the last 168 hours. BNP: BNP (last 3 results)  Recent Labs  10/14/14 2248  BNP 56.1    ProBNP (last 3 results)  Recent Labs  12/11/14 0913  PROBNP 38.0    CBG:  Recent Labs Lab 05/16/15 0701 05/16/15 1105 05/16/15 1652 05/16/15 2125 05/17/15 0842  GLUCAP 143* 205* 182* 212* 132*       Signed:  Tynleigh Birt S  [facility] 05/17/2015, 10:58 AM

## 2015-05-17 NOTE — Progress Notes (Signed)
Patient ID: Adam Butler, male   DOB: 1955/08/19, 59 y.o.   MRN: [id number]         [facility]    Date of Admission:  05/14/2015     Principal Problem:   Hepatic encephalopathy (HCC) Active Problems:   CAD, NATIVE VESSEL   DIASTOLIC HEART FAILURE, CHRONIC   Cryptogenic cirrhosis (HCC)   Type 2 diabetes mellitus, uncontrolled (HCC)   PFO (patent foramen ovale): Per TEE 04/20/2015   Thrombocytopenia (HCC)   FUO (fever of unknown origin)   . antiseptic oral rinse  7 mL Mouth Rinse q12n4p  . chlorhexidine  15 mL Mouth Rinse BID  . insulin aspart  0-9 Units Subcutaneous TID WC  . lactulose  30 g Oral TID  . rifaximin  550 mg Oral BID  . sodium chloride  3 mL Intravenous Q12H    SUBJECTIVE: He is feeling much better and very eager to go home. He denies any current complaints.  Review of Systems: Pertinent items are noted in HPI.  Past Medical History  Diagnosis Date  . Cirrhosis ([place]) 2011    Cryptogenic, Likely NASH. Family/pt deny EtOH. HCV, HBV, HAV negative. ANA negative. AMA positive. Ascites 12/11  . Hyperlipemia   . Coronary artery disease     Inferior MI 12/11; LHC with occluded mid CFX and 80% proximal RCA. EF 55%. He had 3.0 x 28 vision BMS to CFX  . Diastolic CHF, acute ([place])     Echo 12/11 with ef 50-55% and mild LVH. EF 55% by LV0gram in 12/11  . Hypertension   . Type II diabetes mellitus ([place]) 2011  . SVT (supraventricular tachycardia) ([place])     1/12: appeared to be an ectopic atrial tachycardia. Required DCCV with hemodynamic instability  . GI bleed     12/11: Etiology not clearly defined. EGD: nonbleeding esophageal varices.  . S/P coronary artery stent placement 06/2010  . Esophageal varices ([place]) 2011, 2013    no hx acute variceal bleed  . Hepatic encephalopathy ([place]) 2011, 12/2013  . Myocardial infarction [facility]) ? 2012  . Shortness of breath dyspnea   . Arthritis   . Hx of echocardiogram     Echo 5/16:  EF 55-60%, no  RWMA, mod LAE  . Thrombocytopenia ([place])   . Cholelithiasis   . Rectal varices   . PFO (patent foramen ovale): Per TEE 04/20/2015 04/20/2015  . Bacteremia     Social History  Substance Use Topics  . Smoking status: Current Every Day Smoker -- 1.00 packs/day for 36 years    Types: E-cigarettes  . Smokeless tobacco: Current User     Comment: uses vapor cigarettes (2016 )  . Alcohol Use: No    Family History  Problem Relation Age of Onset  . Heart attack Brother 50    MI  . Diabetes Brother   . Heart attack Father   . Diabetes Father   . COPD Father   . COPD Mother   . Stroke Neg Hx   . Heart attack Brother    Allergies  Allergen Reactions  . Isosorbide Other (See Comments)    Nose bleeds  . Lactose Intolerance (Gi) Other (See Comments)    OBJECTIVE: Filed Vitals:   05/17/15 0200 05/17/15 0400 05/17/15 0600 05/17/15 0800  BP: 155/74 148/74 139/90 151/68  Pulse: 80 85 93 89  Temp:  97.6 F (36.4 C)  97.8 F (36.6 C)  TempSrc:  Oral  Oral  Resp: 22  22 21 20   Height:      Weight:      SpO2: 97% 98% 99% 100%   Body mass index is 41.71 kg/(m^2).  General: he is alert and in good spirits. He is sitting up in a chair. He does not appear confused Skin: no rash Lungs: clear Cor: regular S1 and S2 with no murmurs Abdomen: obese but soft and nontender  Lab Results Lab Results  Component Value Date   WBC 4.7 05/17/2015   HGB 12.5* 05/17/2015   HCT 37.3* 05/17/2015   MCV 101.1* 05/17/2015   PLT 40* 05/17/2015    Lab Results  Component Value Date   CREATININE 0.60* 05/17/2015   BUN 11 05/17/2015   NA 133* 05/17/2015   K 3.4* 05/17/2015   CL 106 05/17/2015   CO2 20* 05/17/2015    Lab Results  Component Value Date   ALT 47 05/17/2015   AST 97* 05/17/2015   ALKPHOS 91 05/17/2015   BILITOT 1.5* 05/17/2015     Microbiology: Recent Results (from the past 240 hour(s))  MRSA PCR Screening     Status: None   Collection Time: 05/07/15  2:30 PM  Result Value  Ref Range Status   MRSA by PCR NEGATIVE NEGATIVE Final    Comment:        The GeneXpert MRSA Assay (FDA approved for NASAL specimens only), is one component of a comprehensive MRSA colonization surveillance program. It is not intended to diagnose MRSA infection nor to guide or monitor treatment for MRSA infections.   MRSA PCR Screening     Status: None   Collection Time: 05/14/15 11:09 PM  Result Value Ref Range Status   MRSA by PCR NEGATIVE NEGATIVE Final    Comment:        The GeneXpert MRSA Assay (FDA approved for NASAL specimens only), is one component of a comprehensive MRSA colonization surveillance program. It is not intended to diagnose MRSA infection nor to guide or monitor treatment for MRSA infections.   Culture, Urine     Status: None   Collection Time: 05/15/15  6:43 AM  Result Value Ref Range Status   Specimen Description URINE, CATHETERIZED  Final   Special Requests NONE  Final   Culture   Final    NO GROWTH 1 DAY Performed at [facility]    Report Status 05/16/2015 FINAL  Final  Culture, blood (routine x 2)     Status: None (Preliminary result)   Collection Time: 05/15/15  7:30 AM  Result Value Ref Range Status   Specimen Description BLOOD RIGHT HAND  Final   Special Requests BOTTLES DRAWN AEROBIC AND ANAEROBIC 6CC  Final   Culture   Final    NO GROWTH < 24 HOURS Performed at [facility]    Report Status PENDING  Incomplete  Culture, blood (routine x 2)     Status: None (Preliminary result)   Collection Time: 05/15/15  7:35 AM  Result Value Ref Range Status   Specimen Description BLOOD RIGHT HAND  Final   Special Requests IN PEDIATRIC BOTTLE 2CC  Final   Culture   Final    NO GROWTH < 24 HOURS Performed at [facility]    Report Status PENDING  Incomplete     ASSESSMENT: His fever has resolved spontaneously. I do not see any obvious evidence of active infection.  PLAN: 1. Okay with me to discharge home  today 2. Recommend not restarting her trimethoprim-sulfamethoxazole  Michel Bickers,  [facility] [place] [phone] pager   [phone] cell 05/17/2015, 8:24 AM

## 2015-05-17 NOTE — Progress Notes (Signed)
Discharge instructions given to patient utilizing teach back method no questions at this time. Patient discharged to home

## 2015-05-17 NOTE — Care Management Important Message (Signed)
Important Message  Patient Details  Name: Adam Butler MRN: [id number] Date of Birth: [birth year]   Medicare Important Message Given:  Yes-second notification given    Apolonio Schneiders, RN 05/17/2015, 10:15 AM

## 2015-05-18 ENCOUNTER — Other Ambulatory Visit: Payer: Self-pay | Admitting: *Deleted

## 2015-05-18 ENCOUNTER — Telehealth: Payer: Self-pay | Admitting: *Deleted

## 2015-05-18 NOTE — Telephone Encounter (Signed)
Transition Care Management Follow-up Telephone Call   Date discharged? 05/16/16   How have you been since you were released from the hospital? Improving   Do you understand why you were in the hospital? yes   Do you understand the discharge instructions? yes   Where were you discharged to? Home   Items Reviewed:  Medications reviewed: yes  Allergies reviewed: yes  Dietary changes reviewed: yes  Referrals reviewed: yes, home health   Functional Questionnaire:   Activities of Daily Living (ADLs):   He states they are independent in the following: ambulation, bathing and hygiene, feeding, continence, grooming, toileting and dressing States they require assistance with the following: None   Any transportation issues/concerns?: no   Any patient concerns? yes, medication changes   Confirmed importance and date/time of follow-up visits scheduled yes, 05/25/15 @ 1300  Provider Appointment booked with [name], NP  Confirmed with patient if condition begins to worsen call PCP or go to the ER.  Patient was given the office number and encouraged to call back with question or concerns.  : yes

## 2015-05-18 NOTE — Patient Outreach (Signed)
Transition of care call completed and home visit scheduled for Thursday.  Adam Butler [facility] [place] [phone]

## 2015-05-19 ENCOUNTER — Ambulatory Visit: Payer: PPO | Admitting: Internal Medicine

## 2015-05-19 ENCOUNTER — Encounter: Payer: Self-pay | Admitting: Internal Medicine

## 2015-05-19 ENCOUNTER — Ambulatory Visit (INDEPENDENT_AMBULATORY_CARE_PROVIDER_SITE_OTHER): Payer: PPO | Admitting: Internal Medicine

## 2015-05-19 VITALS — BP 100/70 | HR 60 | Ht 67.0 in | Wt 276.8 lb

## 2015-05-19 DIAGNOSIS — K7290 Hepatic failure, unspecified without coma: Secondary | ICD-10-CM

## 2015-05-19 DIAGNOSIS — K766 Portal hypertension: Secondary | ICD-10-CM | POA: Diagnosis not present

## 2015-05-19 DIAGNOSIS — K7460 Unspecified cirrhosis of liver: Secondary | ICD-10-CM | POA: Diagnosis not present

## 2015-05-19 DIAGNOSIS — K7682 Hepatic encephalopathy: Secondary | ICD-10-CM

## 2015-05-19 MED ORDER — LACTULOSE 10 GM/15ML PO SOLN: 30.0000 g | Freq: Three times a day (TID) | ORAL | Status: DC

## 2015-05-19 NOTE — Patient Instructions (Signed)
We have sent the following medications to your pharmacy for you to pick up at your convenience: Lactulose 30 grams-Take 1-3 times daily (please titrate to where you have 3-5 bm daily)  Continue Nadolol.  Continue Xifaxan twice daily.  You will be due for ultrasound of the abdomen in April 2016.  Please follow up with Dr Hilarie Fredrickson on Friday, 08/06/14 @ 2:00 pm.

## 2015-05-19 NOTE — Progress Notes (Signed)
Subjective:    Patient ID: IDUS RATHKE, male    DOB: [birth year], 59 y.o.   MRN: [id number]  HPI Adam Butler is a 59 year old male with NASH cirrhosis and portal hypertension complicated by hepatic encephalopathy, small esophageal varices, rectal varices, history of ascites and thrombocytopenia who is here for follow-up. He was hospitalized last week for hepatic encephalopathy. Ultrasounds performed and he had no appreciable ascites and thus diagnostic paracentesis could not be performed. He had a low-grade fever without clear source. He had been taking lactulose 30 g 2-3 times daily and reported he had been having bowel movements but despite this had been mentally slow and lethargic. He was seen by primary care and sent to the ER. He is now taking rifaximin 550 twice a day. It is unclear whether he missed this medication for some days. It was approved recently for 1 year through his insurance. He reports that he is trying to follow a low-sodium diet. He feels that his lower extremity edema has improved. He denies abdominal pain. He is taking Lasix 40 mg daily and spironolactone 25 mg daily. He is on nadolol 40 mg daily. Omeprazole 20 mg once daily. He denies alcohol intake  Abdominal ultrasound was done in October which showed no evidence of liver lesions or [place]   Review of Systems  as per history of present illness, otherwise negative  Current Medications, Allergies, Past Medical History, Past Surgical History, Family History and Social History were reviewed in [employer] record.     Objective:   Physical Exam BP 100/70 mmHg  Pulse 60  Ht 5' 7"  (1.702 m)  Wt 276 lb 12.8 oz (125.556 kg)  BMI 43.34 kg/m2 Constitutional: Well-developed and well-nourished. No distress. HEENT: Normocephalic and atraumatic. Oropharynx is clear and moist. No oropharyngeal exudate. Poor dentition.  Conjunctivae are normal.  No scleral icterus. Neck: Neck supple. Trachea  midline. Cardiovascular: Normal rate, regular rhythm and intact distal pulses.  Pulmonary/chest: Effort normal and breath sounds normal. No wheezing, rales or rhonchi. Abdominal: Soft, obese, nontender, nondistended. Bowel sounds active throughout. Marland Kitchen Extremities: no clubbing, cyanosis, trace to 1+ pretibial edema Neurological: Alert and oriented to person place and time. No asterixis Skin: Skin is warm and dry. No rashes noted. Psychiatric: Normal mood and affect. Behavior is normal.  CBC    Component Value Date/Time   WBC 4.7 05/17/2015 0337   RBC 3.69* 05/17/2015 0337   HGB 12.5* 05/17/2015 0337   HCT 37.3* 05/17/2015 0337   PLT 40* 05/17/2015 0337   MCV 101.1* 05/17/2015 0337   MCH 33.9 05/17/2015 0337   MCHC 33.5 05/17/2015 0337   RDW 13.7 05/17/2015 0337   LYMPHSABS 0.7 05/14/2015 1603   MONOABS 0.7 05/14/2015 1603   EOSABS 0.1 05/14/2015 1603   BASOSABS 0.0 05/14/2015 1603    CMP     Component Value Date/Time   NA 133* 05/17/2015 0337   K 3.4* 05/17/2015 0337   CL 106 05/17/2015 0337   CO2 20* 05/17/2015 0337   GLUCOSE 169* 05/17/2015 0337   BUN 11 05/17/2015 0337   CREATININE 0.60* 05/17/2015 0337   CREATININE 0.74 03/15/2011 0827   CALCIUM 8.3* 05/17/2015 0337   PROT 6.1* 05/17/2015 0337   ALBUMIN 2.8* 05/17/2015 0337   AST 97* 05/17/2015 0337   ALT 47 05/17/2015 0337   ALKPHOS 91 05/17/2015 0337   BILITOT 1.5* 05/17/2015 0337   GFRNONAA >60 05/17/2015 0337   GFRAA >60 05/17/2015 7846   Lab Results  Component Value Date   INR 1.42 05/15/2015   INR 1.34 05/07/2015   INR 1.39 04/14/2015    EGD -- 01/01/2014 -- small esophageal varices in the middle and distal third of the esophagus which completely flatten with insufflation. Normal stomach. Normal duodenum. Colonoscopy 04/13/2012 -- portable hypertensive colopathy, rectal varices and some friable mucosa. Most noticeable in the right colon. No polyps were seen.     Assessment & Plan:  59 year old male  with NASH cirrhosis and portal hypertension complicated by hepatic encephalopathy, small esophageal varices, rectal varices, history of ascites and thrombocytopenia who is here for follow-up.  1. NASH cirrhosis with portal hypertension -- difficult time lately particularly with hepatic encephalopathy. I stressed the importance of uninterrupted rifaximin 550 mg twice a day. He will also continue lactulose 1-3 times daily titrated to ensure 3-5 soft but formed bowel movements daily. The importance of a low sodium diet is again stressed. --[place] screening -- up-to-date with ultrasound October 2016, repeat April 2017 --Varices -- history of small esophageal varices, no prior variceal bleeding. On nadolol continue 40 mg daily. Heart rate 60 bpm the day resting which is ideal --Low-salt diet --No alcohol --Flu shot he reports he has already had --Follow-up in 3 months  25 minutes spent with patient today

## 2015-05-20 LAB — CULTURE, BLOOD (ROUTINE X 2)
CULTURE: NO GROWTH
Culture: NO GROWTH

## 2015-05-21 ENCOUNTER — Other Ambulatory Visit: Payer: Self-pay | Admitting: *Deleted

## 2015-05-21 ENCOUNTER — Encounter: Payer: Self-pay | Admitting: *Deleted

## 2015-05-21 NOTE — Patient Outreach (Signed)
[facility]) Care Management  05/21/2015  AUGUSTO DECKMAN Feb 23, 1956 [id number]   S:  Pt admitted last week for 4 days with same problem: hepatic encephalopathy. He came home Sunday. He has all his meds. He has had his follow up GI visit and will see his primary care MD on Monday.   O:  BP 100/60 mmHg  Pulse 69  Resp 16  Wt 270 lb 1.6 oz (122.517 kg)  SpO2 98% FBS 92.           RRR, No edema        Lungs clear  A: Hepatic encephalopathy      HF       DM  P:  Completed MOST form and HCPOA and Living Will. Pt to get notorized.       Rechecked med box which is correct.       Reinforced importance of lactulose and rifaximin!        I will see him again in one week.  [facility] CM Care Plan Problem One        Most Recent Value   Interventions for Short Term Goal #1  Not met, pt has been readmited and must wait until stable for this procedure.    [facility] CM Care Plan Problem Two        Most Recent Value   Interventions for Problem Two Long Term Goal   Pt readmitted last Thursday, LOC change, reinforced med compliance and calling me or MD for any signs of problems.    [facility] CM Care Plan Problem Three        Most Recent Value   [facility] CM Short Term Goal #1 Met Date  05/21/15     [name] [facility] [place] [phone]

## 2015-05-25 ENCOUNTER — Ambulatory Visit (INDEPENDENT_AMBULATORY_CARE_PROVIDER_SITE_OTHER): Payer: PPO | Admitting: Internal Medicine

## 2015-05-25 ENCOUNTER — Encounter: Payer: Self-pay | Admitting: Internal Medicine

## 2015-05-25 VITALS — BP 106/58 | HR 60 | Temp 97.8°F | Wt 274.5 lb

## 2015-05-25 DIAGNOSIS — K7290 Hepatic failure, unspecified without coma: Secondary | ICD-10-CM | POA: Diagnosis not present

## 2015-05-25 DIAGNOSIS — K7581 Nonalcoholic steatohepatitis (NASH): Secondary | ICD-10-CM

## 2015-05-25 DIAGNOSIS — K7682 Hepatic encephalopathy: Secondary | ICD-10-CM

## 2015-05-25 NOTE — Progress Notes (Signed)
Pre visit review using our clinic review tool, if applicable. No additional management support is needed unless otherwise documented below in the visit note. 

## 2015-05-25 NOTE — Patient Instructions (Signed)
Hepatic Encephalopathy Hepatic encephalopathy is a loss of brain function from advanced liver disease. The effects of the condition depend on the type of liver damage and how severe it is. In some cases, hepatic encephalopathy can be reversed. CAUSES The exact cause of hepatic encephalopathy is not known. RISK FACTORS You have a higher risk of getting this condition if your liver is damaged. When the liver is damaged harmful substances called toxins can build up in the body. Certain toxins, such as ammonia, can harm your brain. Conditions that can cause liver damage include:  An infection.  Dehydration.  Intestinal bleeding.  Drinking too much alcohol.  Taking certain medicines, including tranquilizers, water pills (diuretics), antidepressants, or sleeping pills. SIGNS AND SYMPTOMS Signs and symptoms may develop suddenly. Or, they may develop slowly and get worse gradually. Symptoms can range from mild to severe. Mild Hepatic Encephalopathy  Mild confusion.  Personality and mood changes.  Anxiety and agitation.  Drowsiness.  Loss of mental abilities.  Musty or sweet-smelling breath. Worsening or Severe Hepatic Encephalopathy  Slowed movement.  Slurred speech.  Extreme personality changes.  Disorientation.  Abnormal shaking or flapping of the hands.  Coma. DIAGNOSIS To make a diagnosis, your health care provider will do a physical exam. To rule out other causes of your signs and symptoms, he or she may order tests. You may have:  Blood tests. These may be done to check your ammonia level, measure how long it takes your blood to clot, and check for infection.  Liver function tests. These may be done to check how well your liver is working.  MRI and CT scans. These may be done to check for a brain disorder.  Electroencephalogram (EEG). This may be done to measure the electrical activity in your brain. TREATMENT The first step in treatment is identifying and  treating possible triggers. The next step is involves taking medicine to lower the level of toxins in the body and to prevent ammonia from building up. You may need to take:  Antibiotics to reduce the ammonia-producing bacteria in your gut.  Lactulose to help flush ammonia from the gut. HOME CARE INSTRUCTIONS Eating and Drinking  Follow a low-protein diet that includes plenty of fruits, vegetables, and whole grains, as directed by your health care provider. Ammonia is produced when you digest high-protein foods.  Work with a Microbiologist or with your health care provider to make sure you are getting the right balance of protein and minerals.  Drink enough fluids to keep your urine clear or pale yellow. Drinking plenty of water helps prevent constipation.  Do not drink alcohol or use illegal drugs. Medicines  Only take medicine as directed by your health care provider.  If you were prescribed an antibiotic medicine, finish it all even if you start to feel better.  Do not start any new medicines, including over-the-counter medicines, without first checking with your health care provider. SEEK MEDICAL CARE IF:  You have new symptoms.  Your symptoms change.  Your symptoms get worse.  You have a fever.  You are constipated.  You have persistent nausea, vomiting, or diarrhea. SEEK IMMEDIATE MEDICAL CARE IF:  You become very confused or drowsy.  You vomit blood or material that looks like coffee grounds.  Your stool is bloody or black or looks like tar.   This information is not intended to replace advice given to you by your health care provider. Make sure you discuss any questions you have with your health care provider.  Document Released: 09/06/2006 Document Revised: 07/18/2014 Document Reviewed: 02/12/2014 Elsevier Interactive Patient Education [employer].

## 2015-05-25 NOTE — Progress Notes (Signed)
Subjective:    Patient ID: Adam Butler, male    DOB: Jan 06, 1956, 59 y.o.   MRN: [id number]  HPI  Pt presents to the clinic today for hospital followup. He went to the ER 11/3 for AMS. His wife was concerned that he had hepatic encephalopathy (for which he has been hospitalized numerous times for). He does have NASH. His ammonia level was 122. His magnesium was low but all of his other labs were normal. He is on Xifaxin in addition to his Lactulose. Since his ER visit, he reports he has been feeling well. His is having 6 BM's per day. They are now normal in color. He denies blood in his stool. He has not noticed any confusion.  He did follow up with Dr. Hilarie Fredrickson 11/8- no changes were made to his treatment regimen. He is also being followup by [facility] care management for multiple admissions.  Review of Systems      Past Medical History  Diagnosis Date  . Cirrhosis ([place]) 2011    Cryptogenic, Likely NASH. Family/pt deny EtOH. HCV, HBV, HAV negative. ANA negative. AMA positive. Ascites 12/11  . Hyperlipemia   . Coronary artery disease     Inferior MI 12/11; LHC with occluded mid CFX and 80% proximal RCA. EF 55%. He had 3.0 x 28 vision BMS to CFX  . Diastolic CHF, acute ([place])     Echo 12/11 with ef 50-55% and mild LVH. EF 55% by LV0gram in 12/11  . Hypertension   . Type II diabetes mellitus ([place]) 2011  . SVT (supraventricular tachycardia) ([place])     1/12: appeared to be an ectopic atrial tachycardia. Required DCCV with hemodynamic instability  . GI bleed     12/11: Etiology not clearly defined. EGD: nonbleeding esophageal varices.  . S/P coronary artery stent placement 06/2010  . Esophageal varices ([place]) 2011, 2013    no hx acute variceal bleed  . Hepatic encephalopathy ([place]) 2011, 12/2013  . Myocardial infarction [facility]) ? 2012  . Shortness of breath dyspnea   . Arthritis   . Hx of echocardiogram     Echo 5/16:  EF 55-60%, no RWMA, mod LAE  . Thrombocytopenia ([place])   . Cholelithiasis     . Rectal varices   . PFO (patent foramen ovale): Per TEE 04/20/2015 04/20/2015  . Bacteremia     Current Outpatient Prescriptions  Medication Sig Dispense Refill  . aspirin 81 MG tablet Take 81 mg by mouth daily.     Marland Kitchen atorvastatin (LIPITOR) 10 MG tablet Take 10 mg by mouth daily at 6 PM.    . insulin NPH Human (HUMULIN N,NOVOLIN N) 100 UNIT/ML injection Inject 0.35-0.8 mLs (35-80 Units total) into the skin See admin instructions. Take 80 units in the morning and 35 units in the evening. 10 mL 0  . lactulose (CHRONULAC) 10 GM/15ML solution Take 45 mLs (30 g total) by mouth 3 (three) times daily. May take an additional dose as needed for bowel movement 1350 mL 2  . metFORMIN (GLUCOPHAGE) 1000 MG tablet Take 1,000 mg by mouth 2 (two) times daily.  11  . Multiple Vitamin (MULTIVITAMIN) capsule Take 1 capsule by mouth daily.      . nadolol (CORGARD) 40 MG tablet Take 1 tablet (40 mg total) by mouth daily. 30 tablet 5  . nitroGLYCERIN (NITROSTAT) 0.4 MG SL tablet Place 0.4 mg under the tongue every 5 (five) minutes as needed for chest pain (chest pain).    Marland Kitchen omeprazole (PRILOSEC)  20 MG capsule Take 1 capsule (20 mg total) by mouth daily. 30 capsule 6  . ranolazine (RANEXA) 500 MG 12 hr tablet Take 2 tablets (1,000 mg total) by mouth 2 (two) times daily. 60 tablet 11  . rifaximin (XIFAXAN) 550 MG TABS tablet Take 1 tablet (550 mg total) by mouth 2 (two) times daily. 24 tablet 0  . spironolactone (ALDACTONE) 25 MG tablet TAKE 1 TABLET EVERY DAY 30 tablet 11  . furosemide (LASIX) 40 MG tablet Take 40 mg by mouth daily.      No current facility-administered medications for this visit.    Allergies  Allergen Reactions  . Isosorbide Other (See Comments)    Nose bleeds  . Lactose Intolerance (Gi) Other (See Comments)    Family History  Problem Relation Age of Onset  . Heart attack Brother 90    MI  . Diabetes Brother   . Heart attack Father   . Diabetes Father   . COPD Father   . COPD  Mother   . Stroke Neg Hx   . Heart attack Brother     Social History   Social History  . Marital Status: Married    Spouse Name: N/A  . Number of Children: 3  . Years of Education: N/A   Occupational History  . Unemployed Other    Worked in maintenance prior   Social History Main Topics  . Smoking status: Current Every Day Smoker -- 1.00 packs/day for 36 years    Types: E-cigarettes  . Smokeless tobacco: Never Used     Comment: uses vapor cigarettes (2016 )  . Alcohol Use: No  . Drug Use: No  . Sexual Activity: No   Other Topics Concern  . Not on file   Social History Narrative   Married   Gets regular exercise: walking     Constitutional: Pt reports fatigue. Denies fever, malaise,, headache or abrupt weight changes.  Respiratory: Denies difficulty breathing, shortness of breath, cough or sputum production.   Cardiovascular: Denies chest pain, chest tightness, palpitations or swelling in the hands or feet.  Gastrointestinal: Pt reports loose stool. Denies abdominal pain, bloating, constipation, diarrhea or blood in the stool.  Neurological: Pt reports difficulty with memory. Denies dizziness, difficulty with speech or problems with balance and coordination.    No other specific complaints in a complete review of systems (except as listed in HPI above).  Objective:   Physical Exam   BP 106/58 mmHg  Pulse 60  Temp(Src) 97.8 F (36.6 C) (Oral)  Wt 274 lb 8 oz (124.512 kg)  SpO2 98%  Wt Readings from Last 3 Encounters:  05/21/15 270 lb 1.6 oz (122.517 kg)  05/19/15 276 lb 12.8 oz (125.556 kg)  05/17/15 274 lb 4 oz (124.4 kg)    General: Appears his stated age, chronically ill appearing, obese in NAD. Cardiovascular: Normal rate and rhythm. S1,S2 noted.   Pulmonary/Chest: Normal effort and positive vesicular breath sounds. No respiratory distress. No wheezes, rales or ronchi noted.  Abdomen: Soft and nontender. Normal bowel sounds. Neurological: Alert and  oriented.   BMET    Component Value Date/Time   NA 133* 05/17/2015 0337   K 3.4* 05/17/2015 0337   CL 106 05/17/2015 0337   CO2 20* 05/17/2015 0337   GLUCOSE 169* 05/17/2015 0337   BUN 11 05/17/2015 0337   CREATININE 0.60* 05/17/2015 0337   CREATININE 0.74 03/15/2011 0827   CALCIUM 8.3* 05/17/2015 0337   GFRNONAA >60 05/17/2015 7035  GFRAA >60 05/17/2015 0337    Lipid Panel     Component Value Date/Time   CHOL 160 02/16/2015 0902   TRIG 95.0 02/16/2015 0902   HDL 47.00 02/16/2015 0902   CHOLHDL 3 02/16/2015 0902   VLDL 19.0 02/16/2015 0902   LDLCALC 94 02/16/2015 0902    CBC    Component Value Date/Time   WBC 4.7 05/17/2015 0337   RBC 3.69* 05/17/2015 0337   HGB 12.5* 05/17/2015 0337   HCT 37.3* 05/17/2015 0337   PLT 40* 05/17/2015 0337   MCV 101.1* 05/17/2015 0337   MCH 33.9 05/17/2015 0337   MCHC 33.5 05/17/2015 0337   RDW 13.7 05/17/2015 0337   LYMPHSABS 0.7 05/14/2015 1603   MONOABS 0.7 05/14/2015 1603   EOSABS 0.1 05/14/2015 1603   BASOSABS 0.0 05/14/2015 1603    Hgb A1C Lab Results  Component Value Date   HGBA1C 6.9* 05/15/2015        Assessment & Plan:   Hospital followup for hepatic encephalopathy:  Hospital  notes and labs reviewed He appears to be back to baseline We will continue Xifaxin and Lactulose- he will adjust Lactulose so that he is having 4-5 stools per day He will follow up with Dr. Hilarie Fredrickson as scheduled Discussed with him the importance of his wife given him an extra dose of Lactulose at any signs of confusion to avoid further hospitalizations.   RTC as needed or if symptoms persist or worsen

## 2015-05-28 ENCOUNTER — Other Ambulatory Visit: Payer: Self-pay | Admitting: *Deleted

## 2015-05-28 NOTE — Patient Outreach (Signed)
S: Had a health evaluation at [facility] in a mobil unit. They checked his ABIs, urine microalbumin, hgbA1C (5.?)      Pt has been able to take 1 - 2 doses of lactulose daily.  O: BP 108/70 mmHg  Pulse 63  Resp 16  Wt 269 lb (122.018 kg)  SpO2 98% FBS 98       RRR, lungs are clear, trace pedal edema.  A:  Stable NASH      Very good diabetes control      Stable HF  P:  I will call pt next Friday to check in on pt status.      Reinforced to call NP if he has any health problems.  [name] [facility] [place] [phone]

## 2015-06-05 ENCOUNTER — Observation Stay (HOSPITAL_COMMUNITY)
Admission: EM | Admit: 2015-06-05 | Discharge: 2015-06-07 | Disposition: A | Discharge status: Home / Self Care | Payer: PPO | Attending: Internal Medicine | Admitting: Internal Medicine

## 2015-06-05 ENCOUNTER — Ambulatory Visit: Payer: PPO | Admitting: *Deleted

## 2015-06-05 ENCOUNTER — Other Ambulatory Visit: Payer: Self-pay | Admitting: *Deleted

## 2015-06-05 ENCOUNTER — Emergency Department (HOSPITAL_COMMUNITY): Payer: PPO

## 2015-06-05 ENCOUNTER — Encounter (HOSPITAL_COMMUNITY): Payer: Self-pay | Admitting: *Deleted

## 2015-06-05 DIAGNOSIS — R4182 Altered mental status, unspecified: Secondary | ICD-10-CM | POA: Insufficient documentation

## 2015-06-05 DIAGNOSIS — I8510 Secondary esophageal varices without bleeding: Secondary | ICD-10-CM | POA: Insufficient documentation

## 2015-06-05 DIAGNOSIS — Z6841 Body Mass Index (BMI) 40.0 and over, adult: Secondary | ICD-10-CM | POA: Insufficient documentation

## 2015-06-05 DIAGNOSIS — K7469 Other cirrhosis of liver: Secondary | ICD-10-CM | POA: Diagnosis not present

## 2015-06-05 DIAGNOSIS — I252 Old myocardial infarction: Secondary | ICD-10-CM | POA: Insufficient documentation

## 2015-06-05 DIAGNOSIS — E875 Hyperkalemia: Secondary | ICD-10-CM | POA: Diagnosis not present

## 2015-06-05 DIAGNOSIS — R7989 Other specified abnormal findings of blood chemistry: Secondary | ICD-10-CM

## 2015-06-05 DIAGNOSIS — D696 Thrombocytopenia, unspecified: Secondary | ICD-10-CM | POA: Diagnosis not present

## 2015-06-05 DIAGNOSIS — E869 Volume depletion, unspecified: Secondary | ICD-10-CM | POA: Diagnosis not present

## 2015-06-05 DIAGNOSIS — Q211 Atrial septal defect: Secondary | ICD-10-CM | POA: Insufficient documentation

## 2015-06-05 DIAGNOSIS — E119 Type 2 diabetes mellitus without complications: Secondary | ICD-10-CM | POA: Insufficient documentation

## 2015-06-05 DIAGNOSIS — I2510 Atherosclerotic heart disease of native coronary artery without angina pectoris: Secondary | ICD-10-CM

## 2015-06-05 DIAGNOSIS — I5032 Chronic diastolic (congestive) heart failure: Secondary | ICD-10-CM | POA: Insufficient documentation

## 2015-06-05 DIAGNOSIS — Z955 Presence of coronary angioplasty implant and graft: Secondary | ICD-10-CM | POA: Diagnosis not present

## 2015-06-05 DIAGNOSIS — K7682 Hepatic encephalopathy: Secondary | ICD-10-CM | POA: Insufficient documentation

## 2015-06-05 DIAGNOSIS — Z7982 Long term (current) use of aspirin: Secondary | ICD-10-CM | POA: Diagnosis not present

## 2015-06-05 DIAGNOSIS — Z794 Long term (current) use of insulin: Secondary | ICD-10-CM

## 2015-06-05 DIAGNOSIS — Z79899 Other long term (current) drug therapy: Secondary | ICD-10-CM | POA: Insufficient documentation

## 2015-06-05 DIAGNOSIS — I8500 Esophageal varices without bleeding: Secondary | ICD-10-CM

## 2015-06-05 DIAGNOSIS — E785 Hyperlipidemia, unspecified: Secondary | ICD-10-CM | POA: Insufficient documentation

## 2015-06-05 DIAGNOSIS — K7581 Nonalcoholic steatohepatitis (NASH): Secondary | ICD-10-CM | POA: Diagnosis not present

## 2015-06-05 DIAGNOSIS — K7290 Hepatic failure, unspecified without coma: Principal | ICD-10-CM | POA: Insufficient documentation

## 2015-06-05 DIAGNOSIS — I959 Hypotension, unspecified: Secondary | ICD-10-CM | POA: Insufficient documentation

## 2015-06-05 DIAGNOSIS — E1165 Type 2 diabetes mellitus with hyperglycemia: Secondary | ICD-10-CM

## 2015-06-05 DIAGNOSIS — F17290 Nicotine dependence, other tobacco product, uncomplicated: Secondary | ICD-10-CM | POA: Diagnosis not present

## 2015-06-05 DIAGNOSIS — I110 Hypertensive heart disease with heart failure: Secondary | ICD-10-CM | POA: Diagnosis not present

## 2015-06-05 DIAGNOSIS — K7460 Unspecified cirrhosis of liver: Secondary | ICD-10-CM

## 2015-06-05 DIAGNOSIS — IMO0002 Reserved for concepts with insufficient information to code with codable children: Secondary | ICD-10-CM | POA: Diagnosis present

## 2015-06-05 DIAGNOSIS — G9340 Encephalopathy, unspecified: Secondary | ICD-10-CM

## 2015-06-05 LAB — GLUCOSE, CAPILLARY: GLUCOSE-CAPILLARY: 98 mg/dL (ref 65–99)

## 2015-06-05 LAB — LACTIC ACID, PLASMA: Lactic Acid, Venous: 4.1 mmol/L (ref 0.5–2.0)

## 2015-06-05 LAB — URINE MICROSCOPIC-ADD ON

## 2015-06-05 LAB — URINALYSIS, ROUTINE W REFLEX MICROSCOPIC
Glucose, UA: NEGATIVE mg/dL
Hgb urine dipstick: NEGATIVE
Ketones, ur: 15 mg/dL — AB
NITRITE: NEGATIVE
PH: 6 (ref 5.0–8.0)
Protein, ur: NEGATIVE mg/dL
SPECIFIC GRAVITY, URINE: 1.027 (ref 1.005–1.030)

## 2015-06-05 LAB — COMPREHENSIVE METABOLIC PANEL
ALT: 50 U/L (ref 17–63)
ANION GAP: 12 (ref 5–15)
AST: 79 U/L — AB (ref 15–41)
Albumin: 3.1 g/dL — ABNORMAL LOW (ref 3.5–5.0)
Alkaline Phosphatase: 126 U/L (ref 38–126)
BUN: 20 mg/dL (ref 6–20)
CHLORIDE: 102 mmol/L (ref 101–111)
CO2: 22 mmol/L (ref 22–32)
Calcium: 9.4 mg/dL (ref 8.9–10.3)
Creatinine, Ser: 1.04 mg/dL (ref 0.61–1.24)
Glucose, Bld: 123 mg/dL — ABNORMAL HIGH (ref 65–99)
POTASSIUM: 5.2 mmol/L — AB (ref 3.5–5.1)
Sodium: 136 mmol/L (ref 135–145)
TOTAL PROTEIN: 6.7 g/dL (ref 6.5–8.1)
Total Bilirubin: 2.6 mg/dL — ABNORMAL HIGH (ref 0.3–1.2)

## 2015-06-05 LAB — CBC
HEMATOCRIT: 39.2 % (ref 39.0–52.0)
HEMOGLOBIN: 13.2 g/dL (ref 13.0–17.0)
MCH: 33.9 pg (ref 26.0–34.0)
MCHC: 33.7 g/dL (ref 30.0–36.0)
MCV: 100.8 fL — AB (ref 78.0–100.0)
Platelets: 58 10*3/uL — ABNORMAL LOW (ref 150–400)
RBC: 3.89 MIL/uL — AB (ref 4.22–5.81)
RDW: 13.7 % (ref 11.5–15.5)
WBC: 5.1 10*3/uL (ref 4.0–10.5)

## 2015-06-05 LAB — I-STAT ARTERIAL BLOOD GAS, ED
Acid-base deficit: 1 mmol/L (ref 0.0–2.0)
BICARBONATE: 22.8 meq/L (ref 20.0–24.0)
O2 Saturation: 96 %
PH ART: 7.444 (ref 7.350–7.450)
PO2 ART: 80 mmHg (ref 80.0–100.0)
Patient temperature: 98.6
TCO2: 24 mmol/L (ref 0–100)
pCO2 arterial: 33.3 mmHg — ABNORMAL LOW (ref 35.0–45.0)

## 2015-06-05 LAB — CBG MONITORING, ED
GLUCOSE-CAPILLARY: 129 mg/dL — AB (ref 65–99)
Glucose-Capillary: 103 mg/dL — ABNORMAL HIGH (ref 65–99)

## 2015-06-05 LAB — AMMONIA: Ammonia: 49 umol/L — ABNORMAL HIGH (ref 9–35)

## 2015-06-05 MED ORDER — ASPIRIN EC 81 MG PO TBEC
81.0000 mg | DELAYED_RELEASE_TABLET | Freq: Every day | ORAL | Status: DC
  Administered 2015-06-06: 81 mg via ORAL
  Filled 2015-06-05 (×2): qty 1

## 2015-06-05 MED ORDER — PANTOPRAZOLE SODIUM 40 MG PO TBEC
40.0000 mg | DELAYED_RELEASE_TABLET | Freq: Every day | ORAL | Status: DC
  Administered 2015-06-06: 40 mg via ORAL
  Filled 2015-06-05 (×2): qty 1

## 2015-06-05 MED ORDER — RANOLAZINE ER 500 MG PO TB12
500.0000 mg | ORAL_TABLET | Freq: Two times a day (BID) | ORAL | Status: DC
  Administered 2015-06-06 (×3): 500 mg via ORAL
  Filled 2015-06-05 (×7): qty 1

## 2015-06-05 MED ORDER — METFORMIN HCL 500 MG PO TABS
1000.0000 mg | ORAL_TABLET | Freq: Two times a day (BID) | ORAL | Status: DC
  Administered 2015-06-06: 1000 mg via ORAL
  Filled 2015-06-05: qty 2

## 2015-06-05 MED ORDER — ATORVASTATIN CALCIUM 10 MG PO TABS
10.0000 mg | ORAL_TABLET | Freq: Every day | ORAL | Status: DC
  Administered 2015-06-06: 10 mg via ORAL
  Filled 2015-06-05 (×2): qty 1

## 2015-06-05 MED ORDER — LACTULOSE 10 GM/15ML PO SOLN
30.0000 g | Freq: Three times a day (TID) | ORAL | Status: DC
  Administered 2015-06-06 (×2): 30 g via ORAL
  Filled 2015-06-05 (×2): qty 45

## 2015-06-05 MED ORDER — SODIUM CHLORIDE 0.9 % IJ SOLN
3.0000 mL | Freq: Two times a day (BID) | INTRAMUSCULAR | Status: DC
  Administered 2015-06-06 (×2): 3 mL via INTRAVENOUS

## 2015-06-05 MED ORDER — RIFAXIMIN 550 MG PO TABS
550.0000 mg | ORAL_TABLET | Freq: Two times a day (BID) | ORAL | Status: DC
  Administered 2015-06-06 (×3): 550 mg via ORAL
  Filled 2015-06-05 (×5): qty 1

## 2015-06-05 MED ORDER — SODIUM CHLORIDE 0.9 % IV BOLUS (SEPSIS)
250.0000 mL | Freq: Once | INTRAVENOUS | Status: AC
  Administered 2015-06-05: 250 mL via INTRAVENOUS

## 2015-06-05 NOTE — ED Notes (Signed)
The pt has non-alcoholic cirrhosis  And takes laculose for it.  He has been confused in the am for the past 2 weeks.  His wife has been giving him extra laculose when he wakes up confused.   This am he woke up not confused.  So she is not sure what is going on now.  Pt alert no distress

## 2015-06-05 NOTE — ED Notes (Signed)
One unsuccessful attempt to start an  Iv  Veins limited

## 2015-06-05 NOTE — ED Provider Notes (Signed)
CSN: [id number]     Arrival date & time 06/05/15  1412 History   First MD Initiated Contact with Patient 06/05/15 1625     Chief Complaint  Patient presents with  . Altered Mental Status    Patient is a 59 y.o. male presenting with altered mental status. The history is provided by the patient and a relative.  Altered Mental Status Presenting symptoms: confusion   Severity:  Mild Most recent episode:  Today Timing:  Constant Chronicity:  New Associated symptoms: weakness   Associated symptoms: no abdominal pain, no fever, no headaches, no nausea, no rash and no vomiting     Past Medical History  Diagnosis Date  . Cirrhosis ([place]) 2011    Cryptogenic, Likely NASH. Family/pt deny EtOH. HCV, HBV, HAV negative. ANA negative. AMA positive. Ascites 12/11  . Hyperlipemia   . Coronary artery disease     Inferior MI 12/11; LHC with occluded mid CFX and 80% proximal RCA. EF 55%. He had 3.0 x 28 vision BMS to CFX  . Diastolic CHF, acute ([place])     Echo 12/11 with ef 50-55% and mild LVH. EF 55% by LV0gram in 12/11  . Hypertension   . Type II diabetes mellitus ([place]) 2011  . SVT (supraventricular tachycardia) ([place])     1/12: appeared to be an ectopic atrial tachycardia. Required DCCV with hemodynamic instability  . GI bleed     12/11: Etiology not clearly defined. EGD: nonbleeding esophageal varices.  . S/P coronary artery stent placement 06/2010  . Esophageal varices ([place]) 2011, 2013    no hx acute variceal bleed  . Hepatic encephalopathy ([place]) 2011, 12/2013  . Myocardial infarction [facility]) ? 2012  . Shortness of breath dyspnea   . Arthritis   . Hx of echocardiogram     Echo 5/16:  EF 55-60%, no RWMA, mod LAE  . Thrombocytopenia ([place])   . Cholelithiasis   . Rectal varices   . PFO (patent foramen ovale): Per TEE 04/20/2015 04/20/2015  . Bacteremia    Past Surgical History  Procedure Laterality Date  . Cardiac catheterization  07/01/2010    BMS to CFX.  Marland Kitchen Appendectomy    . Orif r leg     . Esophagogastroduodenoscopy  04/13/2012    Procedure: ESOPHAGOGASTRODUODENOSCOPY (EGD);  Surgeon: Inda Castle, MD;  Location: [facility] END[facility];  Service: Endoscopy;  Laterality: N/A;  . Colonoscopy  04/13/2012    Procedure: COLONOSCOPY;  Surgeon: Inda Castle, MD;  Location: [facility] ENDOSCOPY;  Service: Endoscopy;  Laterality: N/A;  . Esophagogastroduodenoscopy N/A 01/01/2014    Procedure: ESOPHAGOGASTRODUODENOSCOPY (EGD);  Surgeon: Jerene Bears, MD;  Location: [facility] ENDOSCOPY;  Service: Endoscopy;  Laterality: N/A;  . Esophageal banding N/A 01/01/2014    Procedure: ESOPHAGEAL BANDING;  Surgeon: Jerene Bears, MD;  Location: [facility] ENDOSCOPY;  Service: Endoscopy;  Laterality: N/A;  . Coronary stent placement  06/30/2010    CFX   Distal        . Left heart catheterization with coronary angiogram N/A 10/15/2014    Procedure: LEFT HEART CATHETERIZATION WITH CORONARY ANGIOGRAM;  Surgeon: Peter M Martinique, MD;  Location: [facility] CATH LAB;  Service: Cardiovascular;  Laterality: N/A;  . Tee without cardioversion N/A 04/20/2015    Procedure: TRANSESOPHAGEAL ECHOCARDIOGRAM (TEE);  Surgeon: Fay Records, MD;  Location: [facility] ENDOSCOPY;  Service: Cardiovascular;  Laterality: N/A;   Family History  Problem Relation Age of Onset  . Heart attack Brother 89    MI  . Diabetes Brother   .  Heart attack Father   . Diabetes Father   . COPD Father   . COPD Mother   . Stroke Neg Hx   . Heart attack Brother    Social History  Substance Use Topics  . Smoking status: Current Every Day Smoker -- 1.00 packs/day for 36 years    Types: E-cigarettes  . Smokeless tobacco: Never Used     Comment: uses vapor cigarettes (2016 )  . Alcohol Use: No    Review of Systems  Constitutional: Negative for fever and chills.  HENT: Negative for rhinorrhea and sore throat.   Eyes: Negative for visual disturbance.  Respiratory: Positive for cough. Negative for shortness of breath.   Cardiovascular: Negative for chest pain.  Gastrointestinal:  Negative for nausea, vomiting, abdominal pain, diarrhea and constipation.  Genitourinary: Negative for dysuria and hematuria.  Musculoskeletal: Negative for back pain and neck pain.  Skin: Negative for rash.  Neurological: Positive for weakness. Negative for syncope and headaches.  Psychiatric/Behavioral: Positive for confusion.  All other systems reviewed and are negative.  Allergies  Isosorbide and Lactose intolerance (gi)  Home Medications   Prior to Admission medications   Medication Sig Start Date End Date Taking? Authorizing Provider  aspirin 81 MG tablet Take 81 mg by mouth daily.    Yes Historical Provider, MD  atorvastatin (LIPITOR) 10 MG tablet Take 10 mg by mouth daily.    Yes Historical Provider, MD  furosemide (LASIX) 40 MG tablet Take 40 mg by mouth daily.  01/09/12  Yes Larey Dresser, MD  insulin NPH Human (HUMULIN N,NOVOLIN N) 100 UNIT/ML injection Inject 0.35-0.8 mLs (35-80 Units total) into the skin See admin instructions. Take 80 units in the morning and 35 units in the evening. 04/20/15  Yes Eugenie Filler, MD  lactulose (CHRONULAC) 10 GM/15ML solution Take 45 mLs (30 g total) by mouth 3 (three) times daily. May take an additional dose as needed for bowel movement 05/19/15  Yes Jerene Bears, MD  metFORMIN (GLUCOPHAGE) 1000 MG tablet Take 1,000 mg by mouth 2 (two) times daily. 11/27/14  Yes Historical Provider, MD  Multiple Vitamin (MULTIVITAMIN) capsule Take 1 capsule by mouth daily.     Yes Historical Provider, MD  nadolol (CORGARD) 40 MG tablet Take 1 tablet (40 mg total) by mouth daily. 01/01/15  Yes Jerene Bears, MD  nitroGLYCERIN (NITROSTAT) 0.4 MG SL tablet Place 0.4 mg under the tongue every 5 (five) minutes as needed for chest pain (chest pain).   Yes Historical Provider, MD  omeprazole (PRILOSEC) 20 MG capsule Take 1 capsule (20 mg total) by mouth daily. 01/15/15  Yes Larey Dresser, MD  ranolazine (RANEXA) 500 MG 12 hr tablet Take 2 tablets (1,000 mg total) by  mouth 2 (two) times daily. Patient taking differently: Take 500 mg by mouth 2 (two) times daily.  01/15/15  Yes Larey Dresser, MD  rifaximin (XIFAXAN) 550 MG TABS tablet Take 1 tablet (550 mg total) by mouth 2 (two) times daily. 02/13/15  Yes Jerene Bears, MD  spironolactone (ALDACTONE) 25 MG tablet TAKE 1 TABLET EVERY DAY 05/13/15  Yes Larey Dresser, MD   BP 124/57 mmHg  Pulse 54  Temp(Src) 97.9 F (36.6 C) (Oral)  Resp 18  Ht 5' 7"  (1.702 m)  Wt 121.1 kg  BMI 41.80 kg/m2  SpO2 100% Physical Exam  Constitutional: He is oriented to person, place, and time. He appears well-developed and well-nourished. No distress.  HENT:  Head: Normocephalic and  atraumatic.  Mouth/Throat: Oropharynx is clear and moist.  Eyes: EOM are normal. Scleral icterus is present.  Neck: Neck supple. No JVD present.  Cardiovascular: Regular rhythm, normal heart sounds and intact distal pulses.  Bradycardia present.   Pulmonary/Chest: Effort normal and breath sounds normal.  Abdominal: Soft. He exhibits no distension. There is no tenderness.  Musculoskeletal: Normal range of motion. He exhibits no edema.  Neurological: He is alert and oriented to person, place, and time. No cranial nerve deficit.  Skin: Skin is warm and dry.  jaundice  Psychiatric: His behavior is normal.    ED Course  Procedures  None   Labs Review Labs Reviewed  COMPREHENSIVE METABOLIC PANEL - Abnormal; Notable for the following:    Potassium 5.2 (*)    Glucose, Bld 123 (*)    Albumin 3.1 (*)    AST 79 (*)    Total Bilirubin 2.6 (*)    All other components within normal limits  LACTIC ACID, PLASMA - Abnormal; Notable for the following:    Lactic Acid, Venous 4.1 (*)    All other components within normal limits  CBC - Abnormal; Notable for the following:    RBC 3.89 (*)    MCV 100.8 (*)    Platelets 58 (*)    All other components within normal limits  AMMONIA - Abnormal; Notable for the following:    Ammonia 49 (*)    All  other components within normal limits  URINALYSIS, ROUTINE W REFLEX MICROSCOPIC (NOT AT [facility]) - Abnormal; Notable for the following:    Color, Urine ORANGE (*)    Bilirubin Urine SMALL (*)    Ketones, ur 15 (*)    Leukocytes, UA TRACE (*)    All other components within normal limits  URINE MICROSCOPIC-ADD ON - Abnormal; Notable for the following:    Squamous Epithelial / LPF 0-5 (*)    Bacteria, UA RARE (*)    All other components within normal limits  LACTIC ACID, PLASMA - Abnormal; Notable for the following:    Lactic Acid, Venous 3.3 (*)    All other components within normal limits  CBG MONITORING, ED - Abnormal; Notable for the following:    Glucose-Capillary 129 (*)    All other components within normal limits  CBG MONITORING, ED - Abnormal; Notable for the following:    Glucose-Capillary 103 (*)    All other components within normal limits  I-STAT ARTERIAL BLOOD GAS, ED - Abnormal; Notable for the following:    pCO2 arterial 33.3 (*)    All other components within normal limits  CULTURE, BLOOD (ROUTINE X 2)  CULTURE, BLOOD (ROUTINE X 2)  URINE CULTURE  BRAIN NATRIURETIC PEPTIDE  GLUCOSE, CAPILLARY  COMPREHENSIVE METABOLIC PANEL  CBC  LACTIC ACID, PLASMA    Imaging Review Dg Chest 2 View  06/05/2015  CLINICAL DATA:  Altered mental status and cough for 1 day. EXAM: CHEST  2 VIEW COMPARISON:  Radiograph 05/15/2015 FINDINGS: Normal cardiac silhouette. Small basilar pleural effusions. No pulmonary edema. No pneumothorax. No focal infiltrate. IMPRESSION: Small pleural effusions. Electronically Signed   By: Suzy Bouchard M.D.   On: 06/05/2015 17:24   I have personally reviewed and evaluated these images and lab results as part of my medical decision-making.   EKG Interpretation   Date/Time:  Friday June 05 2015 16:11:20 EST Ventricular Rate:  57 PR Interval:  148 QRS Duration: 92 QT Interval:  496 QTC Calculation: 482 R Axis:   2 Text Interpretation:  Sinus  bradycardia Lateral infarct , age undetermined  Inferior infarct , age undetermined Abnormal ECG Confirmed by [facility] MD,  Corene Cornea [phone]) on 06/05/2015 4:51:36 PM      MDM   Final diagnoses:  Encephalopathy  Elevated lactic acid level  Hypotension, unspecified hypotension type   Patient is a 59 year old male with a past medical history of cirrhosis, coronary artery disease, hyperlipidemia, CHF, hypertension, diabetes, hepatic encephalopathy on lactulose. Presents with altered mental status. The patient's wife believes that his ammonia levels are high. She has been giving him lactulose. Patient noted to be hypotensive with systolic pressures in the 80s to 90s. Patient is afebrile with a blood pressure. Diminished breath sounds at the bases with bilateral pleural effusions. Lactated noted to be greater than 4. Gentle fluid boluses were given in the setting of CHF. Neuro exam is nonfocal. She is alert oriented 3. He appears well clinically. He does appear a little jaundice. Annular level mildly elevated. Will admit for further hydration and trending of lactic acid.  Discussed with Dr. Dayna Barker.   Gustavus Bryant, MD 06/06/15 0130  Merrily Pew, MD 06/06/15 [phone]

## 2015-06-05 NOTE — ED Notes (Signed)
Report attempted to new bed assignment at 2210

## 2015-06-05 NOTE — ED Notes (Signed)
Needs CBC redrawn

## 2015-06-05 NOTE — ED Notes (Addendum)
PT is so sluggish, confusion, no energy.  Family concerned for high ammonia level or septic. First pressure in right arm at triage 82/55

## 2015-06-05 NOTE — Patient Outreach (Signed)
Pt's wife called me today and reported that Adam Butler is having some alteration in his LOC. She is worried that it could be either another infection from his poor dentition or due to his chronic hepatic encephalopathy. I have advised her to call and get an appt at the primary care office. They can draw blood to see if his WBC is elevated that would go along with an infection and they could check his ammonia level. She said that is what they would do. I advised I would be following his progress through the electronic medical record.  [name] [facility] [phone]

## 2015-06-05 NOTE — ED Provider Notes (Signed)
I saw and evaluated the patient, reviewed the resident's note and I agree with the findings and plan.  Has history of hepatic encephalopathy and also sepsis and deal with intermittent confusion for the last couple weeks however today it different. Did not respond to lactulose. On my examination patient is alert and oriented and in no acute complaints besides being thirsty. Signs are all normal. Rest of exam is also normal. Appropriate plan would be to check ammonia, urine, chest x-ray and screening labs to ensure no obvious abnormalities and likely discharge home.  Had elevated lactic acid of 4, persistent hypotension. Has a history of CHF so aggressive fluid resuscitation was not attempted just gentle hydration.  Secondary to his multiple abnormalities concerning for possible hypoperfusion causing his altered mental status so we'll admit to the hospital for further fluid resuscitation and management.   EKG Interpretation   Date/Time:  Friday June 05 2015 16:11:20 EST Ventricular Rate:  57 PR Interval:  148 QRS Duration: 92 QT Interval:  496 QTC Calculation: 482 R Axis:   2 Text Interpretation:  Sinus bradycardia Lateral infarct , age undetermined  Inferior infarct , age undetermined Abnormal ECG Confirmed by [facility] MD,  Cherika Jessie [phone]) on 06/05/2015 4:51:36 PM        Merrily Pew, MD 06/06/15 5247

## 2015-06-05 NOTE — Progress Notes (Addendum)
Pt. Admitted to room 522. Alert and orientiated x4 moex4 pt appears "tired'. vss no skin issues noted. No c/o pain wife at bedside. pts urine tee-colored.full code. Safety and fall risk went over with patient, he refused video at this time.

## 2015-06-05 NOTE — ED Notes (Signed)
The pt answers some question accurately but he does not know the day of the week

## 2015-06-05 NOTE — ED Notes (Signed)
Pt to xray

## 2015-06-06 DIAGNOSIS — K7460 Unspecified cirrhosis of liver: Secondary | ICD-10-CM

## 2015-06-06 DIAGNOSIS — E872 Acidosis: Secondary | ICD-10-CM

## 2015-06-06 DIAGNOSIS — K7290 Hepatic failure, unspecified without coma: Principal | ICD-10-CM

## 2015-06-06 DIAGNOSIS — I5032 Chronic diastolic (congestive) heart failure: Secondary | ICD-10-CM | POA: Diagnosis not present

## 2015-06-06 DIAGNOSIS — R7989 Other specified abnormal findings of blood chemistry: Secondary | ICD-10-CM | POA: Insufficient documentation

## 2015-06-06 DIAGNOSIS — K7581 Nonalcoholic steatohepatitis (NASH): Secondary | ICD-10-CM

## 2015-06-06 DIAGNOSIS — D696 Thrombocytopenia, unspecified: Secondary | ICD-10-CM | POA: Diagnosis not present

## 2015-06-06 LAB — COMPREHENSIVE METABOLIC PANEL
ALBUMIN: 2.7 g/dL — AB (ref 3.5–5.0)
ALT: 44 U/L (ref 17–63)
AST: 65 U/L — AB (ref 15–41)
Alkaline Phosphatase: 100 U/L (ref 38–126)
Anion gap: 8 (ref 5–15)
BUN: 18 mg/dL (ref 6–20)
CHLORIDE: 101 mmol/L (ref 101–111)
CO2: 23 mmol/L (ref 22–32)
CREATININE: 1 mg/dL (ref 0.61–1.24)
Calcium: 8.7 mg/dL — ABNORMAL LOW (ref 8.9–10.3)
GFR calc Af Amer: >60 mL/min (ref >60)
GLUCOSE: 258 mg/dL — AB (ref 65–99)
POTASSIUM: 3.7 mmol/L (ref 3.5–5.1)
Sodium: 132 mmol/L — ABNORMAL LOW (ref 135–145)
Total Bilirubin: 2.2 mg/dL — ABNORMAL HIGH (ref 0.3–1.2)
Total Protein: 6 g/dL — ABNORMAL LOW (ref 6.5–8.1)

## 2015-06-06 LAB — BRAIN NATRIURETIC PEPTIDE: B NATRIURETIC PEPTIDE 5: 37.5 pg/mL (ref 0.0–100.0)

## 2015-06-06 LAB — LACTIC ACID, PLASMA
LACTIC ACID, VENOUS: 2.1 mmol/L — AB (ref 0.5–2.0)
Lactic Acid, Venous: 3.3 mmol/L (ref 0.5–2.0)
Lactic Acid, Venous: 3 mmol/L (ref 0.5–2.0)
Lactic Acid, Venous: 3.7 mmol/L (ref 0.5–2.0)

## 2015-06-06 LAB — CBC
HEMATOCRIT: 35.7 % — AB (ref 39.0–52.0)
Hemoglobin: 12.4 g/dL — ABNORMAL LOW (ref 13.0–17.0)
MCH: 34.7 pg — AB (ref 26.0–34.0)
MCHC: 34.7 g/dL (ref 30.0–36.0)
MCV: 100 fL (ref 78.0–100.0)
PLATELETS: 75 10*3/uL — AB (ref 150–400)
RBC: 3.57 MIL/uL — ABNORMAL LOW (ref 4.22–5.81)
RDW: 13.6 % (ref 11.5–15.5)
WBC: 3.6 10*3/uL — ABNORMAL LOW (ref 4.0–10.5)

## 2015-06-06 LAB — GLUCOSE, CAPILLARY
GLUCOSE-CAPILLARY: 147 mg/dL — AB (ref 65–99)
GLUCOSE-CAPILLARY: 197 mg/dL — AB (ref 65–99)
GLUCOSE-CAPILLARY: 197 mg/dL — AB (ref 65–99)
Glucose-Capillary: 133 mg/dL — ABNORMAL HIGH (ref 65–99)

## 2015-06-06 MED ORDER — INSULIN ASPART 100 UNIT/ML ~~LOC~~ SOLN
0.0000 [IU] | Freq: Three times a day (TID) | SUBCUTANEOUS | Status: DC
  Administered 2015-06-06 (×2): 2 [IU] via SUBCUTANEOUS
  Administered 2015-06-06: 3 [IU] via SUBCUTANEOUS

## 2015-06-06 MED ORDER — INSULIN ASPART 100 UNIT/ML ~~LOC~~ SOLN: 0.0000 [IU] | Freq: Every day | SUBCUTANEOUS | Status: DC

## 2015-06-06 MED ORDER — SODIUM CHLORIDE 0.9 % IV SOLN
INTRAVENOUS | Status: DC
  Administered 2015-06-06 – 2015-06-07 (×2): via INTRAVENOUS
  Filled 2015-06-06 (×3): qty 1000

## 2015-06-06 MED ORDER — SODIUM CHLORIDE 0.9 % IV BOLUS (SEPSIS)
1000.0000 mL | Freq: Once | INTRAVENOUS | Status: AC
Start: 2015-06-06 — End: 2015-06-06
  Administered 2015-06-06: 1000 mL via INTRAVENOUS

## 2015-06-06 MED ORDER — SODIUM CHLORIDE 0.9 % IV BOLUS (SEPSIS)
500.0000 mL | Freq: Once | INTRAVENOUS | Status: AC
  Administered 2015-06-06: 500 mL via INTRAVENOUS

## 2015-06-06 MED ORDER — SODIUM CHLORIDE 0.9 % IV BOLUS (SEPSIS): 500.0000 mL | Freq: Once | INTRAVENOUS | Status: AC

## 2015-06-06 NOTE — Progress Notes (Signed)
CRITICAL VALUE ALERT  Critical value received:  Lactic acid 2.1  Date of notification:  06/06/2015  Time of notification:  7:49 AM  Critical value read back:Yes.    Nurse who received alert:  Merrily Pew  MD notified (1st page):  Dr. Carles Collet  Time of first page:  7:49 AM  MD notified (2nd page):  Time of second page:  Responding MD:  Dr. Carles Collet  Time MD responded:  7:52 AM  Lactic drawn at 0640 and 1000cc NS Bolus had started at [phone]

## 2015-06-06 NOTE — H&P (Signed)
History and Physical  Patient Name: Adam Butler     YHC:[id number]    DOB: 03/26/1956    DOA: 06/05/2015 Referring physician: Gustavus Bryant, MD PCP: [name], NP      Chief Complaint: Confusion sleepiness  HPI: Adam Butler is a 59 y.o. male with a past medical history significant for nonalcoholic cirrhosis, diastolic CHF, HTN, NIDDM, and morbid obesity who presents with 1 week drowsiness and confusion and elevated lactate.  The patient was in his usual state of health until the beginning of this week when his wife noted that he was becoming more somnolent during the day, shuffling his feet, being too tired to walk out to the mailbox, labile blood sugar, and waking up confused. She tried to titrate his lactulose, and they report about 5 BMs per day.  He didn't have worsening nausea, vomiting or asterixis.  They deny fever, chills, cough, dyspnea, dysuria, hematuria, or urinary urgency.  In the ED, the patient was intermittently hypotensive, which improved with fluid bolus.  Lactate was 4 mmol/L, but he had no fever, leukocytosis, and a chest x-ray was clear.  TRH were asked to admit for possible hepatic encephalopathy with hypotension/hypovolemia from diarrhea.     Review of Systems:  Pt complains of shuffling gait, fatigue, somnolence, intermittent confusion, mild jaundice. All other systems negative except as just noted or noted in the history of present illness.  Allergies  Allergen Reactions  . Isosorbide Other (See Comments)    Nose bleeds  . Lactose Intolerance (Gi) Other (See Comments)    Prior to Admission medications   Medication Sig Start Date End Date Taking? Authorizing Provider  aspirin 81 MG tablet Take 81 mg by mouth daily.    Yes Historical Provider, MD  atorvastatin (LIPITOR) 10 MG tablet Take 10 mg by mouth daily.    Yes Historical Provider, MD  furosemide (LASIX) 40 MG tablet Take 40 mg by mouth daily.  01/09/12  Yes Larey Dresser, MD  insulin  NPH Human (HUMULIN N,NOVOLIN N) 100 UNIT/ML injection Inject 0.35-0.8 mLs (35-80 Units total) into the skin See admin instructions. Take 80 units in the morning and 35 units in the evening. 04/20/15  Yes Eugenie Filler, MD  lactulose (CHRONULAC) 10 GM/15ML solution Take 45 mLs (30 g total) by mouth 3 (three) times daily. May take an additional dose as needed for bowel movement 05/19/15  Yes Jerene Bears, MD  metFORMIN (GLUCOPHAGE) 1000 MG tablet Take 1,000 mg by mouth 2 (two) times daily. 11/27/14  Yes Historical Provider, MD  Multiple Vitamin (MULTIVITAMIN) capsule Take 1 capsule by mouth daily.     Yes Historical Provider, MD  nadolol (CORGARD) 40 MG tablet Take 1 tablet (40 mg total) by mouth daily. 01/01/15  Yes Jerene Bears, MD  nitroGLYCERIN (NITROSTAT) 0.4 MG SL tablet Place 0.4 mg under the tongue every 5 (five) minutes as needed for chest pain (chest pain).   Yes Historical Provider, MD  omeprazole (PRILOSEC) 20 MG capsule Take 1 capsule (20 mg total) by mouth daily. 01/15/15  Yes Larey Dresser, MD  ranolazine (RANEXA) 500 MG 12 hr tablet Take 2 tablets (1,000 mg total) by mouth 2 (two) times daily. Patient taking differently: Take 500 mg by mouth 2 (two) times daily.  01/15/15  Yes Larey Dresser, MD  rifaximin (XIFAXAN) 550 MG TABS tablet Take 1 tablet (550 mg total) by mouth 2 (two) times daily. 02/13/15  Yes Jerene Bears, MD  spironolactone (  ALDACTONE) 25 MG tablet TAKE 1 TABLET EVERY DAY 05/13/15  Yes Larey Dresser, MD    Past Medical History  Diagnosis Date  . Cirrhosis ([place]) 2011    Cryptogenic, Likely NASH. Family/pt deny EtOH. HCV, HBV, HAV negative. ANA negative. AMA positive. Ascites 12/11  . Hyperlipemia   . Coronary artery disease     Inferior MI 12/11; LHC with occluded mid CFX and 80% proximal RCA. EF 55%. He had 3.0 x 28 vision BMS to CFX  . Diastolic CHF, acute ([place])     Echo 12/11 with ef 50-55% and mild LVH. EF 55% by LV0gram in 12/11  . Hypertension   . Type II  diabetes mellitus ([place]) 2011  . SVT (supraventricular tachycardia) ([place])     1/12: appeared to be an ectopic atrial tachycardia. Required DCCV with hemodynamic instability  . GI bleed     12/11: Etiology not clearly defined. EGD: nonbleeding esophageal varices.  . S/P coronary artery stent placement 06/2010  . Esophageal varices ([place]) 2011, 2013    no hx acute variceal bleed  . Hepatic encephalopathy ([place]) 2011, 12/2013  . Myocardial infarction [facility]) ? 2012  . Shortness of breath dyspnea   . Arthritis   . Hx of echocardiogram     Echo 5/16:  EF 55-60%, no RWMA, mod LAE  . Thrombocytopenia ([place])   . Cholelithiasis   . Rectal varices   . PFO (patent foramen ovale): Per TEE 04/20/2015 04/20/2015  . Bacteremia     Past Surgical History  Procedure Laterality Date  . Cardiac catheterization  07/01/2010    BMS to CFX.  Marland Kitchen Appendectomy    . Orif r leg    . Esophagogastroduodenoscopy  04/13/2012    Procedure: ESOPHAGOGASTRODUODENOSCOPY (EGD);  Surgeon: Inda Castle, MD;  Location: [facility] ENDOSCOPY;  Service: Endoscopy;  Laterality: N/A;  . Colonoscopy  04/13/2012    Procedure: COLONOSCOPY;  Surgeon: Inda Castle, MD;  Location: [facility] ENDOSCOPY;  Service: Endoscopy;  Laterality: N/A;  . Esophagogastroduodenoscopy N/A 01/01/2014    Procedure: ESOPHAGOGASTRODUODENOSCOPY (EGD);  Surgeon: Jerene Bears, MD;  Location: [facility] ENDOSCOPY;  Service: Endoscopy;  Laterality: N/A;  . Esophageal banding N/A 01/01/2014    Procedure: ESOPHAGEAL BANDING;  Surgeon: Jerene Bears, MD;  Location: [facility] ENDOSCOPY;  Service: Endoscopy;  Laterality: N/A;  . Coronary stent placement  06/30/2010    CFX   Distal        . Left heart catheterization with coronary angiogram N/A 10/15/2014    Procedure: LEFT HEART CATHETERIZATION WITH CORONARY ANGIOGRAM;  Surgeon: Peter M Martinique, MD;  Location: [facility] CATH LAB;  Service: Cardiovascular;  Laterality: N/A;  . Tee without cardioversion N/A 04/20/2015    Procedure: TRANSESOPHAGEAL  ECHOCARDIOGRAM (TEE);  Surgeon: Fay Records, MD;  Location: [facility] ENDOSCOPY;  Service: Cardiovascular;  Laterality: N/A;    Family history: family history includes COPD in his father and mother; Diabetes in his brother and father; Heart attack in his brother and father; Heart attack (age of onset: 73) in his brother. There is no history of Stroke.  Social History: Patient lives with his wife.  He does not need a cane or walker at baseline, and is able to get around and function well.  He smokes E-cigs.         Physical Exam: BP 124/57 mmHg  Pulse 54  Temp(Src) 97.9 F (36.6 C) (Oral)  Resp 18  Ht 5' 7"  (1.702 m)  Wt 121.1 kg (266 lb 15.6 oz)  BMI 41.80 kg/m2  SpO2 100% General appearance: Obese adult male, sleepy, but oriented and with normal attention and in no acute distress.   Eyes: Slight icterus, conjunctiva pink, lids and lashes normal.     ENT: No nasal deformity, discharge, or epistaxis.  OP moist without lesions.   Skin: Warm and dry.  No jaundice.  Many skin tags. Cardiac: Slow, regular, nl S1-S2, no murmurs appreciated.  Capillary refill is normal.  No LE edema.   Respiratory: Normal respiratory rate and rhythm.  CTAB without rales or wheezes. Abdomen: Abdomen large but soft without rigidity or tenseness.  No TTP. No ascites, distension.   Neuro: Sleepy, but attention normal.  Responding to questions. Pleasant, no distress. No asterixis. Speech is fluent.  Moves all extremities equally and with normal coordination.    Psych: Behavior appropriate.  Affect normal.  No evidence of aural or visual hallucinations or delusions.       Labs on Admission:  The metabolic panel shows normal sodium, high normal potassium, normal bicarbonate, normal renal function. The AST is stable at 79 and the total bilirubin is stable at 2.6 g/dL. The lactic acid level is 4.1 mL/L. The ammonia is 48 The complete blood count shows no leukocytosis, or anemia. There is macrocytosis, and a platelet  count of 60 K/ul.   Radiological Exams on Admission: Personally reviewed: Dg Chest 2 View  06/05/2015  CLINICAL DATA:  Altered mental status and cough for 1 day. EXAM: CHEST  2 VIEW COMPARISON:  Radiograph 05/15/2015 FINDINGS: Normal cardiac silhouette. Small basilar pleural effusions. No pulmonary edema. No pneumothorax. No focal infiltrate. IMPRESSION: Small pleural effusions. Electronically Signed   By: Suzy Bouchard M.D.   On: 06/05/2015 17:24    EKG: Independently reviewed. Sinus with a rate of 57. No ST changes or T-wave inversions.    Assessment/Plan 1. Encephalopathy:  The patient presents with sluggishness and sleepiness without asterixis or nausea and while on optimal therapy for hyperammonemia.  Other than hepatic encephalopathy, possible diagnostic considerations include infection, hyper or hypoglycemia (his symptoms wax and wane, per family), or hypovolemia and poor PO intake. There is no belly pain to suggest SBP, and the patient has no fever or focal findings of pneumonia or UTI.   -Continue lactulose and rifaximin, titrate to 3-5 BM per day -Follow culture data    2. Cirrhosis with varices: -Hold nadolol, spironolactone, and furosemide given hypotension for now -Continue PPI  2. Hypotension and elevated lactic acid:  Suspect this is from hypovolemia from diarrhea.  Lactic acid clearance slow due to liver disease. Rule out infection. -Follow blood culture, follow urine culture -Trend lactic acid after fluid resuscitation  3. IDDM:  -Continue home metformin -Hold home NPH -Sliding scale corrections  4. Coronary disease and diastolic heart failure:  Continue home aspirin, statin, and ranolazine Hold home furosemide and spironolactone for now  5. Thrombocytopenia:  Stable.   6. Hyperkalemia:  Fluid resuscitation and repeat K.       DVT PPx: SCDs Diet: Low sodium Consultants: None Code Status: Full Family Communication: Wife, present at bedside    Medical decision making: What exists of the patient's previous chart was reviewed in depth and the case was discussed with Dr. Henrene Pastor from GI and Dr. Harrietta Guardian. Patient seen 9:00 PM on 06/05/2015.  Disposition Plan:  Admit for observation and follow cultures.  Fluid resuscitation and trend lactic acid to normal.      Edwin Dada [facility] Pager [phone]

## 2015-06-06 NOTE — Progress Notes (Addendum)
PROGRESS NOTE  Adam Butler YDX:[id number] DOB: 1955/12/30 DOA: 06/05/2015 PCP: [name], NP  Brief History 59 year old male with a history of NASH cirrohsis, diastolic CHF, diabetes mellitus, hypertension, morbid obesity presented with one-week history of drowsiness, confusion, and overall lethargy. In addition, the patient's wife had noted that the patient had been too tired to walk to the mailbox and waking up with confusion. As a result, the patient's wife brought the patient to the emergency department.  In the emergency department, the patient was noted to be hypotensive with a lactic acid of 4.1. Notably, the patient was recently discharged from the hospital on 05/08/15 and11/01/2015 after treatment for hepatic encephalopathy. In addition, he was discharged from the hospital on 04/20/2015 after treatment for Viridans Streptococcus bacteremia and HCAP.   Assessment/Plan: Acute encephalopathy -Multifactorial including hepatic encephalopathy, volume depletion, and possible infectious process -Although his ammonia was only mildly elevated at 49, venous samples of ammonia very and may not always try to correlate directly with degree of encephalopathy -furthermore, hepatic encephalopathy is only directly related to ammonia levels up to about a twofold increase above normal -Patient endorses compliance with lactulose and rifaximin -Urinalysis is negative for pyuria -Chest x-ray without infiltrates -Improving with fluid hydration  NASH Cirrhosis -follows Dr. Hilarie Fredrickson -hold nadolol, lasix, aldactone for next 24 hours in setting of volume depletion and low BP Chronic diastolic CHF -Clinically compensated this time -Daily weights -Hold diuretics for the next 24 hours in the setting of low BP and volume depletion and reevaluate on 06/07/2015 Hypotension -Secondary to volume depletion -Improved with fluid resuscitation -follow blood cultures Diabetes mellitus type  2 -06/06/15-- hemoglobin A1c 6.9 -Discontinue metformin -NovoLog scale  -Hold NPH at this time in the setting of decreased oral intake Hyperkalemia -Partly due to the patient's spironolactone as well as renal insufficiency -Monitor BMP Coronary artery disease  -EKG without concerning ischemic changes  -No anginal symptoms  -Continue aspirin  -status post inferior MI in 06/2010. L HC showed an occluded mid circumflex and 80% proximal RCA EF 55% had a bare-metal stent to the circumflex. Lexi scan Myoview 01/2011 EF 54% with moderate inferolateral scar and no ischemia. Unstable angina in 10/2014 with chronic total occlusion of the RCA EF 45-50% -continue Ranexa, ASA, Statin Thrombocytopenia -secondary to cirrhosis -monitor for signs of bleeding   Family Communication:   Pt at beside Disposition Plan:   Home 06/07/2015 in stable Total time 35 min    Procedures/Studies: Dg Chest 2 View  06/05/2015  CLINICAL DATA:  Altered mental status and cough for 1 day. EXAM: CHEST  2 VIEW COMPARISON:  Radiograph 05/15/2015 FINDINGS: Normal cardiac silhouette. Small basilar pleural effusions. No pulmonary edema. No pneumothorax. No focal infiltrate. IMPRESSION: Small pleural effusions. Electronically Signed   By: Suzy Bouchard M.D.   On: 06/05/2015 17:24   Ct Head Wo Contrast  05/07/2015  CLINICAL DATA:  Altered mental status. Found unresponsive this morning. History of nonalcoholic cirrhosis and hepatic encephalopathy. EXAM: CT HEAD WITHOUT CONTRAST TECHNIQUE: Contiguous axial images were obtained from the base of the skull through the vertex without intravenous contrast. COMPARISON:  04/14/2015 FINDINGS: There is no evidence of acute cortical infarct, intracranial hemorrhage, mass, midline shift, or extra-axial fluid collection. Ventricles and sulci are normal. Patchy hypodensities in the subcortical deep cerebral white matter bilaterally are similar to the prior study and nonspecific but compatible  with mild chronic small vessel ischemic disease. Small, chronic right occipital lobe  cortical infarct is unchanged. Prior right cataract extraction is noted. Paranasal sinuses and mastoid air cells are clear. Right maxillary sinus is small. Calcified atherosclerosis is noted at the skullbase. No skull fracture is identified. IMPRESSION: 1. No evidence of acute intracranial abnormality. 2. Chronic ischemic change. Electronically Signed   By: Logan Bores M.D.   On: 05/07/2015 12:04   US Abdomen Limited  05/15/2015  CLINICAL DATA:  Cirrhosis, encephalopathy, fever. Request is made for paracentesis. EXAM: LIMITED ABDOMEN ULTRASOUND FOR ASCITES TECHNIQUE: Limited ultrasound survey for ascites was performed in all four abdominal quadrants.There is no significant ascites present on today's exam. COMPARISON:  None. FINDINGS: No significant ascites present on limited ultrasound of abdomen in all 4 quadrants IMPRESSION: No significant ascites.  Paracentesis not performed. Read by: Rowe Robert, PA-C Electronically Signed   By: Markus Daft M.D.   On: 05/15/2015 15:04   Dg Chest Port 1 View  05/15/2015  CLINICAL DATA:  Hepatic encephalopathy.  Fever. EXAM: PORTABLE CHEST 1 VIEW COMPARISON:  05/07/2015 FINDINGS: A single AP portable view of the chest demonstrates no focal airspace consolidation or alveolar edema. The lungs are grossly clear. There is no large effusion or pneumothorax. Cardiac and mediastinal contours appear unremarkable. IMPRESSION: No active disease. Electronically Signed   By: Andreas Newport M.D.   On: 05/15/2015 06:34   Dg Chest Port 1 View  05/07/2015  CLINICAL DATA:  Insert epic study EXAM: PORTABLE CHEST 1 VIEW COMPARISON:  05/02/2015 FINDINGS: Shallow lung inflation. Heart size is normal. There are no focal consolidations or pleural effusions. Right-sided PICC line tip overlies the level of the upper superior vena cava. IMPRESSION: Shallow lung inflation. No evidence for acute cardio  pulmonary abnormality. Electronically Signed   By: Nolon Nations M.D.   On: 05/07/2015 13:24        Subjective: Patient is more lucid today. Denies any fevers, chills, chest discomfort, short of breath, nausea, vomiting, diarrhea. There is no abdominal pain, dysuria. Denies headache or dizziness.  Objective: Filed Vitals:   06/05/15 2200 06/05/15 2245 06/06/15 0352 06/06/15 0530  BP: 103/47 124/57 114/73 135/84  Pulse: 55 54 61 61  Temp:  97.9 F (36.6 C) 98.1 F (36.7 C)   TempSrc:  Oral Oral   Resp: 17 18 18 18   Height: 5' 7"  (1.702 m)     Weight: 121.1 kg (266 lb 15.6 oz)     SpO2: 99% 100% 97% 95%    Intake/Output Summary (Last 24 hours) at 06/06/15 1103 Last data filed at 06/06/15 1000  Gross per 24 hour  Intake   2180 ml  Output    550 ml  Net   1630 ml   Weight change:  Exam:   General:  Pt is alert, follows commands appropriately, not in acute distress  HEENT: No icterus, No thrush, No neck mass, [place]/AT  Cardiovascular: RRR, S1/S2, no rubs, no gallops  Respiratory: Bibasilar crackles. No wheezing  Abdomen: Soft/+BS, non tender, non distended, no guarding  Extremities: 1+LE edema, No lymphangitis, No petechiae, No rashes, no synovitis  Data Reviewed: Basic Metabolic Panel:  Recent Labs Lab 06/05/15 1651 06/06/15 0153  NA 136 132*  K 5.2* 3.7  CL 102 101  CO2 22 23  GLUCOSE 123* 258*  BUN 20 18  CREATININE 1.04 1.00  CALCIUM 9.4 8.7*   Liver Function Tests:  Recent Labs Lab 06/05/15 1651 06/06/15 0153  AST 79* 65*  ALT 50 44  ALKPHOS 126 100  BILITOT 2.6* 2.2*  PROT 6.7 6.0*  ALBUMIN 3.1* 2.7*   No results for input(s): LIPASE, AMYLASE in the last 168 hours.  Recent Labs Lab 06/05/15 1903  AMMONIA 49*   CBC:  Recent Labs Lab 06/05/15 1724 06/06/15 0153  WBC 5.1 3.6*  HGB 13.2 12.4*  HCT 39.2 35.7*  MCV 100.8* 100.0  PLT 58* 75*   Cardiac Enzymes: No results for input(s): CKTOTAL, CKMB, CKMBINDEX, TROPONINI in the  last 168 hours. BNP: Invalid input(s): POCBNP CBG:  Recent Labs Lab 06/05/15 1615 06/05/15 1649 06/05/15 2312 06/06/15 0820  GLUCAP 129* 103* 98 133*    No results found for this or any previous visit (from the past 240 hour(s)).   Scheduled Meds: . aspirin EC  81 mg Oral Daily  . atorvastatin  10 mg Oral Daily  . insulin aspart  0-15 Units Subcutaneous TID WC  . insulin aspart  0-5 Units Subcutaneous QHS  . lactulose  30 g Oral TID  . pantoprazole  40 mg Oral Daily  . ranolazine  500 mg Oral BID  . rifaximin  550 mg Oral BID  . sodium chloride  3 mL Intravenous Q12H   Continuous Infusions:    Paizley Ramella, DO  [facility] Pager [phone]  If 7PM-7AM, please contact night-coverage [email or web address] Password [email or web address] 06/06/2015, 11:03 AM   LOS: 1 day

## 2015-06-06 NOTE — Progress Notes (Signed)
Lactic acid remains elevated normal saline bolus ordered and started .

## 2015-06-06 NOTE — Progress Notes (Signed)
CRITICAL VALUE ALERT  Critical value received:  Lactic acid 3.7  Date of notification: 06/06/2015   Time of notification:  1:25 PM   Critical value read back:Yes.    Nurse who received alert:  Girtha Rm  Dr. Carles Collet verbally made aware.

## 2015-06-07 DIAGNOSIS — D696 Thrombocytopenia, unspecified: Secondary | ICD-10-CM | POA: Diagnosis not present

## 2015-06-07 DIAGNOSIS — K7581 Nonalcoholic steatohepatitis (NASH): Secondary | ICD-10-CM | POA: Diagnosis not present

## 2015-06-07 DIAGNOSIS — I5032 Chronic diastolic (congestive) heart failure: Secondary | ICD-10-CM | POA: Diagnosis not present

## 2015-06-07 DIAGNOSIS — K7682 Hepatic encephalopathy: Secondary | ICD-10-CM | POA: Insufficient documentation

## 2015-06-07 DIAGNOSIS — K7290 Hepatic failure, unspecified without coma: Secondary | ICD-10-CM | POA: Diagnosis not present

## 2015-06-07 LAB — GLUCOSE, CAPILLARY: Glucose-Capillary: 131 mg/dL — ABNORMAL HIGH (ref 65–99)

## 2015-06-07 LAB — COMPREHENSIVE METABOLIC PANEL
ALT: 51 U/L (ref 17–63)
ANION GAP: 9 (ref 5–15)
AST: 84 U/L — ABNORMAL HIGH (ref 15–41)
Albumin: 2.7 g/dL — ABNORMAL LOW (ref 3.5–5.0)
Alkaline Phosphatase: 117 U/L (ref 38–126)
BUN: 8 mg/dL (ref 6–20)
CHLORIDE: 105 mmol/L (ref 101–111)
CO2: 22 mmol/L (ref 22–32)
Calcium: 8.7 mg/dL — ABNORMAL LOW (ref 8.9–10.3)
Creatinine, Ser: 0.78 mg/dL (ref 0.61–1.24)
GFR calc non Af Amer: >60 mL/min (ref >60)
GLUCOSE: 128 mg/dL — AB (ref 65–99)
POTASSIUM: 4.1 mmol/L (ref 3.5–5.1)
SODIUM: 136 mmol/L (ref 135–145)
TOTAL PROTEIN: 6.3 g/dL — AB (ref 6.5–8.1)
Total Bilirubin: 1.4 mg/dL — ABNORMAL HIGH (ref 0.3–1.2)

## 2015-06-07 NOTE — Evaluation (Signed)
Physical Therapy Evaluation Patient Details Name: Adam Butler MRN: [id number] DOB: 23-Sep-1955 Today's Date: 06/07/2015   History of Present Illness  Adam Butler is a 59 y.o. male with a past medical history significant for nonalcoholic cirrhosis, diastolic CHF, HTN, NIDDM, and morbid obesity who presents with 1 week drowsiness and confusion and elevated lactate.  Clinical Impression  Pt admitted with above. Pt functioning near baseline. Pt safe to ambulate short/household distances without AD but recommend RW for community or long distance ambulation to assist with energy conservation and minimize falls risk.    Follow Up Recommendations No PT follow up;Supervision - Intermittent    Equipment Recommendations  None recommended by PT (recommend RW for outdoor use, reports he has one)    Recommendations for Other Services       Precautions / Restrictions Precautions Precautions: None Restrictions Weight Bearing Restrictions: No      Mobility  Bed Mobility               General bed mobility comments: pt recieved sitting up in chair  Transfers Overall transfer level: Modified independent Equipment used: None             General transfer comment: used arm rests, steady  Ambulation/Gait Ambulation/Gait assistance: Supervision Ambulation Distance (Feet): 200 Feet Assistive device: Rolling walker (2 wheeled);None Gait Pattern/deviations: Step-through pattern Gait velocity: wfl Gait velocity interpretation: Below normal speed for age/gender General Gait Details: no episodes of LOB, ambulated 1/2 the distance with RW and 1/2 distance without. Pt with DOE during ambulation without AD.  Stairs Stairs: Yes Stairs assistance: Min guard Stair Management: One rail Right Number of Stairs: 3 General stair comments: step to pattern  Wheelchair Mobility    Modified Rankin (Stroke Patients Only)       Balance Overall balance assessment: History of  Falls                                           Pertinent Vitals/Pain Pain Assessment: No/denies pain    Home Living Family/patient expects to be discharged to:: Private residence Living Arrangements: Spouse/significant other Available Help at Discharge: Family;Available PRN/intermittently Type of Home: House Home Access: Stairs to enter Entrance Stairs-Rails: Right;Left;Can reach both Entrance Stairs-Number of Steps: 2 Home Layout: One level Home Equipment: Walker - 2 wheels;Cane - single point      Prior Function Level of Independence: Independent         Comments: Uses cane at times if walking long distance.     Hand Dominance   Dominant Hand: Right    Extremity/Trunk Assessment   Upper Extremity Assessment: Overall WFL for tasks assessed           Lower Extremity Assessment: Overall WFL for tasks assessed      Cervical / Trunk Assessment: Normal  Communication   Communication: No difficulties  Cognition Arousal/Alertness: Awake/alert Behavior During Therapy: WFL for tasks assessed/performed Overall Cognitive Status: Within Functional Limits for tasks assessed                      General Comments      Exercises        Assessment/Plan    PT Assessment Patient needs continued PT services  PT Diagnosis Generalized weakness   PT Problem List Decreased activity tolerance;Decreased balance;Decreased mobility  PT Treatment Interventions     PT Goals (  Current goals can be found in the Care Plan section) Acute Rehab PT Goals Patient Stated Goal: home today PT Goal Formulation: With patient Time For Goal Achievement: 06/14/15 Potential to Achieve Goals: Good Additional Goals Additional Goal #1: Pt to score >19 on DGI to indicate minimal falls risk.    Frequency Min 2X/week   Barriers to discharge        Co-evaluation               End of Session Equipment Utilized During Treatment: Gait belt Activity  Tolerance: Patient tolerated treatment well Patient left: in chair;with call bell/phone within reach Nurse Communication: Mobility status    Functional Assessment Tool Used: clinical judgement Functional Limitation: Mobility: Walking and moving around Mobility: Walking and Moving Around Current Status (D5520): At least 1 percent but less than 20 percent impaired, limited or restricted Mobility: Walking and Moving Around Goal Status [phone]): At least 1 percent but less than 20 percent impaired, limited or restricted    Time: 0829-0847 PT Time Calculation (min) (ACUTE ONLY): 18 min   Charges:   PT Evaluation $Initial PT Evaluation Tier I: 1 Procedure     PT G Codes:   PT G-Codes **NOT FOR INPATIENT CLASS** Functional Assessment Tool Used: clinical judgement Functional Limitation: Mobility: Walking and moving around Mobility: Walking and Moving Around Current Status (V6122): At least 1 percent but less than 20 percent impaired, limited or restricted Mobility: Walking and Moving Around Goal Status [phone]): At least 1 percent but less than 20 percent impaired, limited or restricted    Kingsley Callander 06/07/2015, 10:40 AM   Kittie Plater, PT, DPT Pager #: [phone] Office #: [phone]

## 2015-06-07 NOTE — Progress Notes (Signed)
NURSING PROGRESS NOTE  Adam Butler [id number] Discharge Data: 06/07/2015 11:04 AM Attending Provider: No att. providers found WUJ:[id number], Rollene Fare, NP   [name] to be D/C'd Home per MD order.    All IV's will be discontinued and monitored for bleeding.  All belongings will be returned to patient for patient to take home.  Last Documented Vital Signs:  Blood pressure 116/55, pulse 73, temperature 97.7 F (36.5 C), temperature source Oral, resp. rate 22, height 5' 7"  (1.702 m), weight 122.6 kg (270 lb 4.5 oz), SpO2 99 %.  Joslyn Hy, MSN, RN, [employer]

## 2015-06-07 NOTE — Discharge Summary (Signed)
Physician Discharge Summary  Adam Butler KKX:[id number] DOB: Nov 24, 1955 DOA: 06/05/2015  PCP: [name], NP  Admit date: 06/05/2015 Discharge date: 06/07/2015  Recommendations for Outpatient Follow-up:  1. Pt will need to follow up with PCP in 1-2 weeks post discharge 2. Please obtain BMP   Discharge Diagnoses:  Acute encephalopathy -Multifactorial including hepatic encephalopathy, volume depletion, and possible infectious process -Although his ammonia was only mildly elevated at 49, venous samples of ammonia very and may not always try to correlate directly with degree of encephalopathy -furthermore, hepatic encephalopathy is only directly related to ammonia levels up to about a twofold increase above normal -Patient endorses compliance with lactulose and rifaximin -Urinalysis is negative for pyuria -Chest x-ray without infiltrates -Improving with fluid hydration--> mental status returned back to baseline NASH Cirrhosis -follows Dr. Hilarie Fredrickson -hold nadolol, lasix, aldactone and low BP--discussed with patient and wife to hold off restarting his nadolol, furosemide, spironolactone 16/96/78 Chronic diastolic CHF -Clinically compensated this time -Daily weights -Hold diuretics for the next 24 hours in the setting of low BP and volume depletion and reevaluate on 06/07/2015 Hypotension -Secondary to volume depletion -Improved with fluid resuscitation--discussed with patient and wife to hold off restarting his nadolol, furosemide, spironolactone 06/08/15 -follow blood cultures--negative to date  Diabetes mellitus type 2 -06/06/15-- hemoglobin A1c 6.9 -Discontinue metformin--restart after discharge  -NovoLog scale  -Hold NPH at this time in the setting of decreased oral intake-Restart after discharge  Hyperkalemia -Partly due to the patient's spironolactone as well as renal insufficiency -Monitor BMP--resolved > Coronary artery disease  -EKG without concerning ischemic  changes  -No anginal symptoms  -Continue aspirin  -status post inferior MI in 06/2010. L HC showed an occluded mid circumflex and 80% proximal RCA EF 55% had a bare-metal stent to the circumflex. Lexi scan Myoview 01/2011 EF 54% with moderate inferolateral scar and no ischemia. Unstable angina in 10/2014 with chronic total occlusion of the RCA EF 45-50% -continue Ranexa, ASA, Statin Thrombocytopenia -secondary to cirrhosis -monitor for signs of bleeding    Discharge Condition: Stable   Disposition:  home   Diet:2 gram sodium Wt Readings from Last 3 Encounters:  06/07/15 122.6 kg (270 lb 4.5 oz)  05/28/15 122.018 kg (269 lb)  05/25/15 124.512 kg (274 lb 8 oz)    History of present illness:  59 year old male with a history of NASH cirrohsis, diastolic CHF, diabetes mellitus, hypertension, morbid obesity presented with one-week history of drowsiness, confusion, and overall lethargy. In addition, the patient's wife had noted that the patient had been too tired to walk to the mailbox and waking up with confusion. As a result, the patient's wife brought the patient to the emergency department. In the emergency department, the patient was noted to be hypotensive with a lactic acid of 4.1. Notably, the patient was recently discharged from the hospital on 05/08/15 and11/01/2015 after treatment for hepatic encephalopathy. In addition, he was discharged from the hospital on 04/20/2015 after treatment for Viridans Streptococcus bacteremia and HCAP. The patient's renal function did show a degree of fluid/volume depletion. The patient was fluid resuscitated. His mental status returned to baseline. Renal function improved back to baseline. Patient was instructed to hold off on his diuretics and nadolol until 06/08/2015. He was instructed follow up with his primary care provider within 1-2 weeks for BMP check.   Discharge Exam: Filed Vitals:   06/07/15 0444 06/07/15 0810  BP: 112/46 116/55  Pulse: 67  73  Temp: 97.7 F (36.5 C)   Resp: 18  [place]:   06/07/15 0015 06/07/15 0442 06/07/15 0444 06/07/15 0810  BP: 101/42  112/46 116/55  Pulse: 64  67 73  Temp: 98.3 F (36.8 C)  97.7 F (36.5 C)   TempSrc: Oral  Oral   Resp: 18  18 22   Height:      Weight:  122.6 kg (270 lb 4.5 oz)    SpO2: 100%  100% 99%   General: A&O x 3, NAD, pleasant, cooperative Cardiovascular: RRR, no rub, no gallop, no S3 Respiratory: CTAB, no wheeze, no rhonchi Abdomen:soft, nontender, nondistended, positive bowel sounds Extremities: 1+ LE edema, No lymphangitis, no petechiae  Discharge Instructions      Discharge Instructions    Diet - low sodium heart healthy    Complete by:  As directed      Increase activity slowly    Complete by:  As directed             Medication List    TAKE these medications        aspirin 81 MG tablet  Take 81 mg by mouth daily.     atorvastatin 10 MG tablet  Commonly known as:  LIPITOR  Take 10 mg by mouth daily.     furosemide 40 MG tablet  Commonly known as:  LASIX  Take 40 mg by mouth daily.     insulin NPH Human 100 UNIT/ML injection  Commonly known as:  HUMULIN N,NOVOLIN N  Inject 0.35-0.8 mLs (35-80 Units total) into the skin See admin instructions. Take 80 units in the morning and 35 units in the evening.     lactulose 10 GM/15ML solution  Commonly known as:  CHRONULAC  Take 45 mLs (30 g total) by mouth 3 (three) times daily. May take an additional dose as needed for bowel movement     metFORMIN 1000 MG tablet  Commonly known as:  GLUCOPHAGE  Take 1,000 mg by mouth 2 (two) times daily.     multivitamin capsule  Take 1 capsule by mouth daily.     nadolol 40 MG tablet  Commonly known as:  CORGARD  Take 1 tablet (40 mg total) by mouth daily.     nitroGLYCERIN 0.4 MG SL tablet  Commonly known as:  NITROSTAT  Place 0.4 mg under the tongue every 5 (five) minutes as needed for chest pain (chest pain).     omeprazole 20 MG capsule    Commonly known as:  PRILOSEC  Take 1 capsule (20 mg total) by mouth daily.     ranolazine 500 MG 12 hr tablet  Commonly known as:  RANEXA  Take 2 tablets (1,000 mg total) by mouth 2 (two) times daily.     rifaximin 550 MG Tabs tablet  Commonly known as:  XIFAXAN  Take 1 tablet (550 mg total) by mouth 2 (two) times daily.     spironolactone 25 MG tablet  Commonly known as:  ALDACTONE  TAKE 1 TABLET EVERY DAY         The results of significant diagnostics from this hospitalization (including imaging, microbiology, ancillary and laboratory) are listed below for reference.    Significant Diagnostic Studies: Dg Chest 2 View  06/05/2015  CLINICAL DATA:  Altered mental status and cough for 1 day. EXAM: CHEST  2 VIEW COMPARISON:  Radiograph 05/15/2015 FINDINGS: Normal cardiac silhouette. Small basilar pleural effusions. No pulmonary edema. No pneumothorax. No focal infiltrate. IMPRESSION: Small pleural effusions. Electronically Signed   By: Helane Gunther.D.  On: 06/05/2015 17:24   US Abdomen Limited  05/15/2015  CLINICAL DATA:  Cirrhosis, encephalopathy, fever. Request is made for paracentesis. EXAM: LIMITED ABDOMEN ULTRASOUND FOR ASCITES TECHNIQUE: Limited ultrasound survey for ascites was performed in all four abdominal quadrants.There is no significant ascites present on today's exam. COMPARISON:  None. FINDINGS: No significant ascites present on limited ultrasound of abdomen in all 4 quadrants IMPRESSION: No significant ascites.  Paracentesis not performed. Read by: Rowe Robert, PA-C Electronically Signed   By: Markus Daft M.D.   On: 05/15/2015 15:04   Dg Chest Port 1 View  05/15/2015  CLINICAL DATA:  Hepatic encephalopathy.  Fever. EXAM: PORTABLE CHEST 1 VIEW COMPARISON:  05/07/2015 FINDINGS: A single AP portable view of the chest demonstrates no focal airspace consolidation or alveolar edema. The lungs are grossly clear. There is no large effusion or pneumothorax. Cardiac and  mediastinal contours appear unremarkable. IMPRESSION: No active disease. Electronically Signed   By: Andreas Newport M.D.   On: 05/15/2015 06:34     Microbiology: Recent Results (from the past 240 hour(s))  Urine culture     Status: None (Preliminary result)   Collection Time: 06/05/15  8:18 PM  Result Value Ref Range Status   Specimen Description URINE, RANDOM  Final   Special Requests NONE  Final   Culture TOO YOUNG TO READ  Final   Report Status PENDING  Incomplete  Blood culture (routine x 2)     Status: None (Preliminary result)   Collection Time: 06/05/15  8:47 PM  Result Value Ref Range Status   Specimen Description BLOOD RIGHT ARM  Final   Special Requests IN PEDIATRIC BOTTLE 1CC  Final   Culture NO GROWTH < 24 HOURS  Final   Report Status PENDING  Incomplete  Blood culture (routine x 2)     Status: None (Preliminary result)   Collection Time: 06/05/15  8:54 PM  Result Value Ref Range Status   Specimen Description BLOOD RIGHT HAND  Final   Special Requests BOTTLES DRAWN AEROBIC AND ANAEROBIC 5CC  Final   Culture NO GROWTH < 24 HOURS  Final   Report Status PENDING  Incomplete     Labs: Basic Metabolic Panel:  Recent Labs Lab 06/05/15 1651 06/06/15 0153 06/07/15 0458  NA 136 132* 136  K 5.2* 3.7 4.1  CL 102 101 105  CO2 22 23 22   GLUCOSE 123* 258* 128*  BUN 20 18 8   CREATININE 1.04 1.00 0.78  CALCIUM 9.4 8.7* 8.7*   Liver Function Tests:  Recent Labs Lab 06/05/15 1651 06/06/15 0153 06/07/15 0458  AST 79* 65* 84*  ALT 50 44 51  ALKPHOS 126 100 117  BILITOT 2.6* 2.2* 1.4*  PROT 6.7 6.0* 6.3*  ALBUMIN 3.1* 2.7* 2.7*   No results for input(s): LIPASE, AMYLASE in the last 168 hours.  Recent Labs Lab 06/05/15 1903  AMMONIA 49*   CBC:  Recent Labs Lab 06/05/15 1724 06/06/15 0153  WBC 5.1 3.6*  HGB 13.2 12.4*  HCT 39.2 35.7*  MCV 100.8* 100.0  PLT 58* 75*   Cardiac Enzymes: No results for input(s): CKTOTAL, CKMB, CKMBINDEX, TROPONINI in  the last 168 hours. BNP: Invalid input(s): POCBNP CBG:  Recent Labs Lab 06/06/15 0820 06/06/15 1154 06/06/15 1656 06/06/15 2029 06/07/15 0809  GLUCAP 133* 147* 197* 197* 131*    Time coordinating discharge:  Greater than 30 minutes  Signed:  Zamariyah Furukawa, DO [facility] Pager: [phone] 06/07/2015, 8:42 AM

## 2015-06-08 ENCOUNTER — Ambulatory Visit (INDEPENDENT_AMBULATORY_CARE_PROVIDER_SITE_OTHER): Payer: PPO | Admitting: Internal Medicine

## 2015-06-08 ENCOUNTER — Encounter: Payer: Self-pay | Admitting: Internal Medicine

## 2015-06-08 ENCOUNTER — Telehealth: Payer: Self-pay | Admitting: Internal Medicine

## 2015-06-08 ENCOUNTER — Telehealth: Payer: Self-pay | Admitting: Cardiology

## 2015-06-08 VITALS — BP 112/60 | HR 65 | Temp 97.7°F | Wt 276.0 lb

## 2015-06-08 DIAGNOSIS — E875 Hyperkalemia: Secondary | ICD-10-CM | POA: Diagnosis not present

## 2015-06-08 DIAGNOSIS — I25119 Atherosclerotic heart disease of native coronary artery with unspecified angina pectoris: Secondary | ICD-10-CM

## 2015-06-08 DIAGNOSIS — K7581 Nonalcoholic steatohepatitis (NASH): Secondary | ICD-10-CM | POA: Diagnosis not present

## 2015-06-08 DIAGNOSIS — E860 Dehydration: Secondary | ICD-10-CM

## 2015-06-08 DIAGNOSIS — R4182 Altered mental status, unspecified: Secondary | ICD-10-CM | POA: Diagnosis not present

## 2015-06-08 LAB — URINE CULTURE

## 2015-06-08 NOTE — Progress Notes (Signed)
Subjective:    Patient ID: Adam Butler, male    DOB: 1955-12-18, 59 y.o.   MRN: [id number]  HPI  Pt presents to the clinic today for hospital follow up for confusion secondary to dehydration. His wife was concerned that his ammonia was elevated secondary to his NASH. At the ER, his labs revealed a slightly elevated creatinine, ammonia of 49, lactic acid of 4.1. He was hypotensive as well. He was admitted for resuscitation and further monitoring. He was given IV fluid, his BP meds were held. His mental status returned to baseline so he was discharged home. He was advised to follow up with his PCP in 1-2 weeks. His wife reports that since he has been home, he has been at his baseline. He did fall getting into the car this morning. He has been trying to make sure he drinks plenty of water.  He has had 5 BM's this morning.  Review of Systems      Past Medical History  Diagnosis Date  . Cirrhosis ([place]) 2011    Cryptogenic, Likely NASH. Family/pt deny EtOH. HCV, HBV, HAV negative. ANA negative. AMA positive. Ascites 12/11  . Hyperlipemia   . Coronary artery disease     Inferior MI 12/11; LHC with occluded mid CFX and 80% proximal RCA. EF 55%. He had 3.0 x 28 vision BMS to CFX  . Diastolic CHF, acute ([place])     Echo 12/11 with ef 50-55% and mild LVH. EF 55% by LV0gram in 12/11  . Hypertension   . Type II diabetes mellitus ([place]) 2011  . SVT (supraventricular tachycardia) ([place])     1/12: appeared to be an ectopic atrial tachycardia. Required DCCV with hemodynamic instability  . GI bleed     12/11: Etiology not clearly defined. EGD: nonbleeding esophageal varices.  . S/P coronary artery stent placement 06/2010  . Esophageal varices ([place]) 2011, 2013    no hx acute variceal bleed  . Hepatic encephalopathy ([place]) 2011, 12/2013  . Myocardial infarction [facility]) ? 2012  . Shortness of breath dyspnea   . Arthritis   . Hx of echocardiogram     Echo 5/16:  EF 55-60%, no RWMA, mod LAE  .  Thrombocytopenia ([place])   . Cholelithiasis   . Rectal varices   . PFO (patent foramen ovale): Per TEE 04/20/2015 04/20/2015  . Bacteremia     Current Outpatient Prescriptions  Medication Sig Dispense Refill  . aspirin 81 MG tablet Take 81 mg by mouth daily.     Marland Kitchen atorvastatin (LIPITOR) 10 MG tablet Take 10 mg by mouth daily.     . furosemide (LASIX) 40 MG tablet Take 40 mg by mouth daily.     . insulin NPH Human (HUMULIN N,NOVOLIN N) 100 UNIT/ML injection Inject 0.35-0.8 mLs (35-80 Units total) into the skin See admin instructions. Take 80 units in the morning and 35 units in the evening. 10 mL 0  . lactulose (CHRONULAC) 10 GM/15ML solution Take 45 mLs (30 g total) by mouth 3 (three) times daily. May take an additional dose as needed for bowel movement 1350 mL 2  . metFORMIN (GLUCOPHAGE) 1000 MG tablet Take 1,000 mg by mouth 2 (two) times daily.  11  . Multiple Vitamin (MULTIVITAMIN) capsule Take 1 capsule by mouth daily.      . nadolol (CORGARD) 40 MG tablet Take 1 tablet (40 mg total) by mouth daily. 30 tablet 5  . nitroGLYCERIN (NITROSTAT) 0.4 MG SL tablet Place 0.4 mg under the  tongue every 5 (five) minutes as needed for chest pain (chest pain).    Marland Kitchen omeprazole (PRILOSEC) 20 MG capsule Take 1 capsule (20 mg total) by mouth daily. 30 capsule 6  . ranolazine (RANEXA) 500 MG 12 hr tablet Take 2 tablets (1,000 mg total) by mouth 2 (two) times daily. (Patient taking differently: Take 500 mg by mouth 2 (two) times daily. ) 60 tablet 11  . rifaximin (XIFAXAN) 550 MG TABS tablet Take 1 tablet (550 mg total) by mouth 2 (two) times daily. 24 tablet 0  . spironolactone (ALDACTONE) 25 MG tablet TAKE 1 TABLET EVERY DAY 30 tablet 11   No current facility-administered medications for this visit.    Allergies  Allergen Reactions  . Isosorbide Other (See Comments)    Nose bleeds  . Lactose Intolerance (Gi) Other (See Comments)    Family History  Problem Relation Age of Onset  . Heart attack  Brother 35    MI  . Diabetes Brother   . Heart attack Father   . Diabetes Father   . COPD Father   . COPD Mother   . Stroke Neg Hx   . Heart attack Brother     Social History   Social History  . Marital Status: Married    Spouse Name: N/A  . Number of Children: 3  . Years of Education: N/A   Occupational History  . Unemployed Other    Worked in maintenance prior   Social History Main Topics  . Smoking status: Current Every Day Smoker -- 1.00 packs/day for 36 years    Types: E-cigarettes  . Smokeless tobacco: Never Used     Comment: uses vapor cigarettes (2016 )  . Alcohol Use: No  . Drug Use: No  . Sexual Activity: No   Other Topics Concern  . Not on file   Social History Narrative   Married   Gets regular exercise: walking     Constitutional: Pt reports fatigue. Denies fever, malaise, headache or abrupt weight changes.  HEENT: Denies eye pain, eye redness, ear pain, ringing in the ears, wax buildup, runny nose, nasal congestion, bloody nose, or sore throat. Respiratory: Denies difficulty breathing, shortness of breath, cough or sputum production.   Cardiovascular: Denies chest pain, chest tightness, palpitations or swelling in the hands or feet.  Gastrointestinal: Pt reports loose stool. Denies abdominal pain, bloating, constipation, or blood in the stool.  Skin: Denies redness, rashes, lesions or ulcercations.  Neurological: Denies dizziness, difficulty with memory, difficulty with speech or problems with balance and coordination.  Psych: Denies anxiety, depression, SI/HI.  No other specific complaints in a complete review of systems (except as listed in HPI above).  Objective:   Physical Exam  BP 112/60 mmHg  Pulse 65  Temp(Src) 97.7 F (36.5 C) (Oral)  Wt 276 lb (125.193 kg)  SpO2 99% Wt Readings from Last 3 Encounters:  06/08/15 276 lb (125.193 kg)  06/07/15 270 lb 4.5 oz (122.6 kg)  05/28/15 269 lb (122.018 kg)    General: Appears his stated  age, obese in NAD. Skin: Warm, dry and intact. No bruising or abrasions noted. Cardiovascular: Normal rate and rhythm. S1,S2 noted.  No murmur, rubs or gallops noted. 1+ pitting edema BLE. Pulmonary/Chest: Normal effort and positive vesicular breath sounds. No respiratory distress. No wheezes, rales or ronchi noted.  Abdomen: Soft and nontender.  Musculoskeletal: Gait slow but steady. He is walking with a cane. Neurological: Alert and oriented.     BMET  Component Value Date/Time   NA 136 06/07/2015 0458   K 4.1 06/07/2015 0458   CL 105 06/07/2015 0458   CO2 22 06/07/2015 0458   GLUCOSE 128* 06/07/2015 0458   BUN 8 06/07/2015 0458   CREATININE 0.78 06/07/2015 0458   CREATININE 0.74 03/15/2011 0827   CALCIUM 8.7* 06/07/2015 0458   GFRNONAA >60 06/07/2015 0458   GFRAA >60 06/07/2015 0458    Lipid Panel     Component Value Date/Time   CHOL 160 02/16/2015 0902   TRIG 95.0 02/16/2015 0902   HDL 47.00 02/16/2015 0902   CHOLHDL 3 02/16/2015 0902   VLDL 19.0 02/16/2015 0902   LDLCALC 94 02/16/2015 0902    CBC    Component Value Date/Time   WBC 3.6* 06/06/2015 0153   RBC 3.57* 06/06/2015 0153   HGB 12.4* 06/06/2015 0153   HCT 35.7* 06/06/2015 0153   PLT 75* 06/06/2015 0153   MCV 100.0 06/06/2015 0153   MCH 34.7* 06/06/2015 0153   MCHC 34.7 06/06/2015 0153   RDW 13.6 06/06/2015 0153   LYMPHSABS 0.7 05/14/2015 1603   MONOABS 0.7 05/14/2015 1603   EOSABS 0.1 05/14/2015 1603   BASOSABS 0.0 05/14/2015 1603    Hgb A1C Lab Results  Component Value Date   HGBA1C 6.9* 05/15/2015         Assessment & Plan:   Hospital follow up for dehydration, AMS, hyperkalemia:  Hospital notes, labs and imaging reviewed It is too soon to repeat BMET. He sees Dr. Aundra Dubin in 2 weeks, will see if he will draw it He has restarted all home meds Advised him to push fluids Advised wife to call me with any concerns before sending him to the ER  RTC as needed

## 2015-06-08 NOTE — Telephone Encounter (Signed)
Pt's wife stated they check BG and was normal per triage nurse--pt expressed understanding and will let us know if anything changes

## 2015-06-08 NOTE — Progress Notes (Signed)
Pre visit review using our clinic review tool, if applicable. No additional management support is needed unless otherwise documented below in the visit note. 

## 2015-06-08 NOTE — Telephone Encounter (Signed)
[facility]  Patient Name: Adam Butler  DOB: 1955/09/25    Initial Comment Spouse fell in parking lot coming from [place] office (did not hit his head). He is vomiting now.    Nurse Assessment  Nurse: Harlow Mares, RN, Suanne Marker Date/Time Eilene Ghazi Time): 06/08/2015 3:19:29 PM  Confirm and document reason for call. If symptomatic, describe symptoms. ---Spouse fell in parking lot coming from [place] office (did not hit his head). He is vomiting now. Caller reports that he fell on the dirt. Patient was in hospital on Friday for sepsis. Reports that she is worried about a change in his blood sugar. Caller reports that patient is alert but is not acting himself. Patient is more slower to respond than normal. Advised to check blood sugar at this time (pt is diabetic). Takes insulin. Reports that patient has cirrhosis of the liver and his ammonia levels were high last week, was hospitalized for this. Vomited x1 today.  Has the patient traveled out of the country within the last 30 days? ---No  Does the patient have any new or worsening symptoms? ---Yes  Will a triage be completed? ---Yes  Related visit to physician within the last 2 weeks? ---Yes  Does the PT have any chronic conditions? (i.e. diabetes, asthma, etc.) ---Yes  List chronic conditions. ---cirrhosis; CVD; diabetes; hypertension  Is this a behavioral health call? ---No     Guidelines    Guideline Title Affirmed Question Affirmed Notes  Vomiting MILD or MODERATE vomiting (e.g., 1 - 5 times / day) (all triage questions negative)    Final Disposition User   P[place], RN, Suanne Marker    Disagree/Comply: Comply

## 2015-06-08 NOTE — Telephone Encounter (Signed)
Pt's wife Starla Link advised Ranexa should not cause nosebleed. Pt's wife asking if it would be Ok with Dr Aundra Dubin to decrease Ranexa to 556m bid from 10082mbid. Pt trying to determine cause of nosebleed. Pt's wife states pt's primary care PCP suggested asking Dr McAundra Dubinf pt could decrease Ranexa dose.  Pt's wife advised I will forward to Dr McAundra Dubinor review.

## 2015-06-08 NOTE — Telephone Encounter (Signed)
He can decrease it to 500 mg bid but it did not cause the nosebleed.  If he has chest pain at 500 bid, he should increase it back to 1000 bid.

## 2015-06-08 NOTE — Telephone Encounter (Signed)
New message      Pt c/o medication issue:  1. Name of Medication:  ranexa 2. How are you currently taking this medication (dosage and times per day)? 575m 2tab in am and 2 tab in pm started yesterday 3. Are you having a reaction (difficulty breathing--STAT)? no 4. What is your medication issue? Prior to Sunday, pt was taking 1 tablet in am and 1 tablet in pm.  Pt had a bad nosebleed last night.  Could this have been from the double dosage of ranexa?

## 2015-06-08 NOTE — Telephone Encounter (Signed)
Call pt:  Check his sugar and make sure it is not low Have him drink 16 ounces of water If confusion continues, give him a dose of Lactulose

## 2015-06-08 NOTE — Telephone Encounter (Signed)
No Ranexa should not cause nosebleed.

## 2015-06-08 NOTE — Telephone Encounter (Signed)
I will forward to Dr McLean for review 

## 2015-06-08 NOTE — Telephone Encounter (Signed)
Pt was seen earlier today at [facility]; a fall from earlier this AM noted in office note.

## 2015-06-08 NOTE — Patient Instructions (Signed)

## 2015-06-09 ENCOUNTER — Other Ambulatory Visit: Payer: Self-pay | Admitting: *Deleted

## 2015-06-09 NOTE — Patient Outreach (Signed)
Transition of care call #1 after another ED visit and subsequent hospitalization for hepatic encephalopathy. Pt feels good today. He saw his primary care provider yesterday. He has everything he needs.   A: Hepatic encephalopahty  P:  I will visit on Thursday afternoon.      Called Dr. Hilarie Fredrickson and discussed pt plan of care. Asked if we can so some kind of treatment at home to avoid these mulitple ED visits, asked about disease trajectory, asked about palliative care. MD wants "Liver team" to assess from [facility] before we do anything else. Dr. Hilarie Fredrickson does not believe pt is a canditate for transplant but wants to bring in the team to make sure.      I am going to bring pt case to our difficult case discussion next week.  [name] [facility] [place] [phone]

## 2015-06-10 LAB — CULTURE, BLOOD (ROUTINE X 2)
CULTURE: NO GROWTH
CULTURE: NO GROWTH

## 2015-06-10 NOTE — Telephone Encounter (Signed)
Pt's wife, Starla Link notified.

## 2015-06-10 NOTE — Telephone Encounter (Signed)
LMTCB

## 2015-06-10 NOTE — Telephone Encounter (Signed)
Pt's wife Starla Link rtn your call -can only be reached at 4p since she is at work [phone]

## 2015-06-11 ENCOUNTER — Other Ambulatory Visit: Payer: Self-pay | Admitting: *Deleted

## 2015-06-11 NOTE — Patient Outreach (Signed)
[facility]) Care Management  06/11/2015  Adam Butler May 16, 1956 [id number]  S:  Pt states he is feeling better. He says he is eating better and drinking plenty of fluids. He walks to       the mailbox when he feel like it, usually daily. He is taking his meds appropriatetly. He has slept       good this week.        He thinks that what he ate before Thanksgiving sent him to the hospital: regular sodas, tacos and             Hamburger.        I let pt know I have talked to Dr. Hilarie Fredrickson about his condition and repeated admissions and what can             we do to stop this. He is going to refer pt to he consulting [facility] Liver[facility]Transplant team.            We also discussed palliative care to assist in treated the pt at home if he is determined to not             be a candidate for transplant.  O:  BP 100/54 mmHg  Pulse 68  Resp 16  Wt 269 lb (122.018 kg)  SpO2 98% FBS 89       RRR, Murmur, No edema       Lungs clear  A:  Hepatic Encephalopathy      HF      DM  P:  I will call pt next week and see him in 2 weeks.         THN CM Care Plan Problem One        Most Recent Value   Care Plan Problem One  Multiple ED visis and hospitalizations related to hepatic encephalopathy.   Role Documenting the Problem One  Care Management [place] for Problem One  Active   THN Long Term Goal (31-90 days)  Pt ED and hositalizations will be reduced.   THN Long Term Goal Start Date  06/11/15   Interventions for Problem One Long Term Goal  Pt reinforced to call me or MD with first sign of problems. I have discussed care with GI specialist and we are working on plan: refer to [facility] Liver Transplant team for consideration and consider palliative care if not candidate. Com up with plan to treat pt at home for sxs.   THN CM Short Term Goal #1 (0-30 days)  Pt will see [facility] team this month. [Pt will see [facility] team in the next month.]   THN CM Short Term Goal #1 Start  Date  06/11/15   Interventions for Short Term Goal #1  Confered with Dr. Hilarie Fredrickson, he is making arrangements for this to take place.   THN CM Short Term Goal #2 (0-30 days)  Pt to complete his HCPOA and Living will.    THN CM Short Term Goal #2 Start Date  06/11/15   Interventions for Short Term Goal #2  Encouraged pt to get his doucments notorized and make copies for his providers.     [name] [facility] [place] [phone]

## 2015-06-14 ENCOUNTER — Encounter (HOSPITAL_COMMUNITY): Payer: Self-pay

## 2015-06-14 ENCOUNTER — Emergency Department (HOSPITAL_COMMUNITY): Payer: PPO

## 2015-06-14 ENCOUNTER — Inpatient Hospital Stay (HOSPITAL_COMMUNITY)
Admission: EM | Admit: 2015-06-14 | Discharge: 2015-06-16 | DRG: 442 | Disposition: A | Discharge status: Home / Self Care | Payer: PPO | Attending: Internal Medicine | Admitting: Internal Medicine

## 2015-06-14 DIAGNOSIS — F17200 Nicotine dependence, unspecified, uncomplicated: Secondary | ICD-10-CM | POA: Diagnosis present

## 2015-06-14 DIAGNOSIS — Q211 Atrial septal defect: Secondary | ICD-10-CM | POA: Diagnosis not present

## 2015-06-14 DIAGNOSIS — Z794 Long term (current) use of insulin: Secondary | ICD-10-CM

## 2015-06-14 DIAGNOSIS — I2510 Atherosclerotic heart disease of native coronary artery without angina pectoris: Secondary | ICD-10-CM | POA: Diagnosis present

## 2015-06-14 DIAGNOSIS — I8500 Esophageal varices without bleeding: Secondary | ICD-10-CM | POA: Diagnosis present

## 2015-06-14 DIAGNOSIS — E785 Hyperlipidemia, unspecified: Secondary | ICD-10-CM | POA: Diagnosis present

## 2015-06-14 DIAGNOSIS — K7290 Hepatic failure, unspecified without coma: Secondary | ICD-10-CM | POA: Diagnosis not present

## 2015-06-14 DIAGNOSIS — I252 Old myocardial infarction: Secondary | ICD-10-CM

## 2015-06-14 DIAGNOSIS — M1990 Unspecified osteoarthritis, unspecified site: Secondary | ICD-10-CM | POA: Diagnosis present

## 2015-06-14 DIAGNOSIS — K7460 Unspecified cirrhosis of liver: Secondary | ICD-10-CM

## 2015-06-14 DIAGNOSIS — D696 Thrombocytopenia, unspecified: Secondary | ICD-10-CM | POA: Diagnosis present

## 2015-06-14 DIAGNOSIS — Z7982 Long term (current) use of aspirin: Secondary | ICD-10-CM

## 2015-06-14 DIAGNOSIS — K7291 Hepatic failure, unspecified with coma: Secondary | ICD-10-CM | POA: Diagnosis not present

## 2015-06-14 DIAGNOSIS — K766 Portal hypertension: Secondary | ICD-10-CM | POA: Diagnosis present

## 2015-06-14 DIAGNOSIS — E119 Type 2 diabetes mellitus without complications: Secondary | ICD-10-CM | POA: Diagnosis present

## 2015-06-14 DIAGNOSIS — K7682 Hepatic encephalopathy: Secondary | ICD-10-CM | POA: Diagnosis present

## 2015-06-14 DIAGNOSIS — R4182 Altered mental status, unspecified: Secondary | ICD-10-CM | POA: Diagnosis not present

## 2015-06-14 DIAGNOSIS — Z79899 Other long term (current) drug therapy: Secondary | ICD-10-CM | POA: Diagnosis not present

## 2015-06-14 DIAGNOSIS — I5032 Chronic diastolic (congestive) heart failure: Secondary | ICD-10-CM | POA: Diagnosis not present

## 2015-06-14 DIAGNOSIS — Z7984 Long term (current) use of oral hypoglycemic drugs: Secondary | ICD-10-CM

## 2015-06-14 DIAGNOSIS — Z955 Presence of coronary angioplasty implant and graft: Secondary | ICD-10-CM

## 2015-06-14 DIAGNOSIS — K7469 Other cirrhosis of liver: Secondary | ICD-10-CM | POA: Diagnosis present

## 2015-06-14 DIAGNOSIS — I110 Hypertensive heart disease with heart failure: Secondary | ICD-10-CM | POA: Diagnosis present

## 2015-06-14 LAB — COMPREHENSIVE METABOLIC PANEL
ALT: 52 U/L (ref 17–63)
ANION GAP: 8 (ref 5–15)
AST: 75 U/L — ABNORMAL HIGH (ref 15–41)
Albumin: 2.5 g/dL — ABNORMAL LOW (ref 3.5–5.0)
Alkaline Phosphatase: 162 U/L — ABNORMAL HIGH (ref 38–126)
BUN: 7 mg/dL (ref 6–20)
CHLORIDE: 102 mmol/L (ref 101–111)
CO2: 26 mmol/L (ref 22–32)
Calcium: 8.8 mg/dL — ABNORMAL LOW (ref 8.9–10.3)
Creatinine, Ser: 0.9 mg/dL (ref 0.61–1.24)
Glucose, Bld: 185 mg/dL — ABNORMAL HIGH (ref 65–99)
POTASSIUM: 4.5 mmol/L (ref 3.5–5.1)
SODIUM: 136 mmol/L (ref 135–145)
Total Bilirubin: 1.5 mg/dL — ABNORMAL HIGH (ref 0.3–1.2)
Total Protein: 6.4 g/dL — ABNORMAL LOW (ref 6.5–8.1)

## 2015-06-14 LAB — GLUCOSE, CAPILLARY
GLUCOSE-CAPILLARY: 253 mg/dL — AB (ref 65–99)
Glucose-Capillary: 116 mg/dL — ABNORMAL HIGH (ref 65–99)

## 2015-06-14 LAB — URINALYSIS, ROUTINE W REFLEX MICROSCOPIC
GLUCOSE, UA: 500 mg/dL — AB
Ketones, ur: 15 mg/dL — AB
Leukocytes, UA: NEGATIVE
Nitrite: NEGATIVE
PROTEIN: NEGATIVE mg/dL
Specific Gravity, Urine: 1.029 (ref 1.005–1.030)
pH: 6.5 (ref 5.0–8.0)

## 2015-06-14 LAB — I-STAT ARTERIAL BLOOD GAS, ED
ACID-BASE EXCESS: 2 mmol/L (ref 0.0–2.0)
BICARBONATE: 24.8 meq/L — AB (ref 20.0–24.0)
O2 Saturation: 95 %
PH ART: 7.482 — AB (ref 7.350–7.450)
PO2 ART: 67 mmHg — AB (ref 80.0–100.0)
TCO2: 26 mmol/L (ref 0–100)
pCO2 arterial: 33.2 mmHg — ABNORMAL LOW (ref 35.0–45.0)

## 2015-06-14 LAB — I-STAT CG4 LACTIC ACID, ED
LACTIC ACID, VENOUS: 2.1 mmol/L — AB (ref 0.5–2.0)
LACTIC ACID, VENOUS: 2.43 mmol/L — AB (ref 0.5–2.0)

## 2015-06-14 LAB — MRSA PCR SCREENING: MRSA by PCR: NEGATIVE

## 2015-06-14 LAB — CBC
HCT: 35.8 % — ABNORMAL LOW (ref 39.0–52.0)
HEMOGLOBIN: 12 g/dL — AB (ref 13.0–17.0)
MCH: 33.5 pg (ref 26.0–34.0)
MCHC: 33.5 g/dL (ref 30.0–36.0)
MCV: 100 fL (ref 78.0–100.0)
Platelets: 58 10*3/uL — ABNORMAL LOW (ref 150–400)
RBC: 3.58 MIL/uL — AB (ref 4.22–5.81)
RDW: 13.7 % (ref 11.5–15.5)
WBC: 5.1 10*3/uL (ref 4.0–10.5)

## 2015-06-14 LAB — URINE MICROSCOPIC-ADD ON
BACTERIA UA: NONE SEEN
SQUAMOUS EPITHELIAL / LPF: NONE SEEN
WBC, UA: NONE SEEN WBC/hpf (ref 0–5)

## 2015-06-14 LAB — PROTIME-INR
INR: 1.34 (ref 0.00–1.49)
PROTHROMBIN TIME: 16.7 s — AB (ref 11.6–15.2)

## 2015-06-14 LAB — AMMONIA: Ammonia: 176 umol/L — ABNORMAL HIGH (ref 9–35)

## 2015-06-14 LAB — CBG MONITORING, ED: Glucose-Capillary: 164 mg/dL — ABNORMAL HIGH (ref 65–99)

## 2015-06-14 MED ORDER — LACTULOSE 10 GM/15ML PO SOLN
30.0000 g | Freq: Three times a day (TID) | ORAL | Status: DC
  Administered 2015-06-14 – 2015-06-16 (×6): 30 g via ORAL
  Filled 2015-06-14 (×7): qty 45

## 2015-06-14 MED ORDER — SODIUM CHLORIDE 0.9 % IJ SOLN
3.0000 mL | Freq: Two times a day (BID) | INTRAMUSCULAR | Status: DC
  Administered 2015-06-14 – 2015-06-15 (×2): 3 mL via INTRAVENOUS

## 2015-06-14 MED ORDER — ONDANSETRON HCL 4 MG/2ML IJ SOLN: 4.0000 mg | Freq: Four times a day (QID) | INTRAMUSCULAR | Status: DC | PRN

## 2015-06-14 MED ORDER — ACETAMINOPHEN 650 MG RE SUPP: 650.0000 mg | Freq: Four times a day (QID) | RECTAL | Status: DC | PRN

## 2015-06-14 MED ORDER — LACTULOSE 10 GM/15ML PO SOLN
30.0000 g | Freq: Once | ORAL | Status: DC
  Filled 2015-06-14: qty 45

## 2015-06-14 MED ORDER — RIFAXIMIN 550 MG PO TABS
550.0000 mg | ORAL_TABLET | Freq: Two times a day (BID) | ORAL | Status: DC
  Administered 2015-06-14 – 2015-06-16 (×5): 550 mg via ORAL
  Filled 2015-06-14 (×6): qty 1

## 2015-06-14 MED ORDER — ACETAMINOPHEN 325 MG PO TABS: 650.0000 mg | ORAL_TABLET | Freq: Four times a day (QID) | ORAL | Status: DC | PRN

## 2015-06-14 MED ORDER — SODIUM CHLORIDE 0.9 % IV BOLUS (SEPSIS)
500.0000 mL | Freq: Once | INTRAVENOUS | Status: AC
  Administered 2015-06-14: 500 mL via INTRAVENOUS

## 2015-06-14 MED ORDER — LACTULOSE ENEMA
300.0000 mL | Freq: Four times a day (QID) | ORAL | Status: DC
  Filled 2015-06-14 (×3): qty 300

## 2015-06-14 MED ORDER — ONDANSETRON HCL 4 MG PO TABS: 4.0000 mg | ORAL_TABLET | Freq: Four times a day (QID) | ORAL | Status: DC | PRN

## 2015-06-14 MED ORDER — SODIUM CHLORIDE 0.9 % IV SOLN
INTRAVENOUS | Status: DC
  Administered 2015-06-14: 14:00:00 via INTRAVENOUS

## 2015-06-14 MED ORDER — LACTULOSE ENEMA
300.0000 mL | Freq: Once | ORAL | Status: DC
  Filled 2015-06-14: qty 300

## 2015-06-14 MED ORDER — INSULIN ASPART 100 UNIT/ML ~~LOC~~ SOLN
0.0000 [IU] | Freq: Three times a day (TID) | SUBCUTANEOUS | Status: DC
  Administered 2015-06-15: 5 [IU] via SUBCUTANEOUS
  Administered 2015-06-15 – 2015-06-16 (×3): 2 [IU] via SUBCUTANEOUS

## 2015-06-14 NOTE — ED Notes (Signed)
Pt brought in EMS for AMS that was identified this morning by wife.  Pt recently d/c from hospital for encephalopathy and wife reports it presents the same.  Pt responds to painful stimuli.  Airway is patent.

## 2015-06-14 NOTE — ED Notes (Addendum)
Spoke with Ray MD regarding pt taking oral medications.  V/O to hold at this time until pt becomes more alert.  500cc NS bolus initiated per v/o.

## 2015-06-14 NOTE — ED Notes (Signed)
Lactulose received from Pharmacy.  Sent down to materials for enema kit at this time.

## 2015-06-14 NOTE — ED Notes (Signed)
Pt is now alert and verbal.  Pt remains confused.  Vann DO made aware of change of pt mental status and recommends to provide pt with water.  Pt was able to swallow water with straw without difficulty, coughing or distress. Vann DO made aware.

## 2015-06-14 NOTE — H&P (Signed)
[facility] History and Physical  Adam Butler [facility]:[id number] DOB: 01/15/1956 DOA: 06/14/2015  Referring physician: Emergency Department PCP: [name], NP   CHIEF COMPLAINT: altered mental status   HPI: Adam Butler is a 59 y.o. male with multiple medical problems not limited to hypertension, coronary artery disease status post stent placement, PFO, and cryptogenic cirrhosis (likely [name]) disease. Patient has had several admissions this year for hepatic encephalopathy. Patient brought into the emergency department with altered mental status. He is nearly obtunded, wife gives history. Patient has been compliant with rifaximin, he takes lactulose as needed (if doesn't have daily bowel movements). Yesterday wife noticed patient was kind of slow in thoughts . Today she was unable to wake him and knew that ammonia must be high. Patient has not been ill recently. He has not started any new medications recently. Again, wife is sure patient is compliant with taking Xifaxan  ED COURSE:     Blood cultures drawn Lactulose enema times 1 500 cc saline bolus  Labs:   Na 136, K+ 4.5, glucose 185, alkaline phosphatase 162, AST 75,  total bilirubin 1.5, ammonia 176, INR 1.34  Urinalysis:   Glucose 500, ketones 15  CXR: Hypoinspiratory changes, no acute findings    EKG:    Normal sinus rhythm QT 389  Medications  lactulose (CHRONULAC) enema 200 gm (not administered)  sodium chloride 0.9 % bolus 500 mL (500 mLs Intravenous New Bag/Given 06/14/15 1134)    Review of Systems  Unable to perform ROS: acuity of condition    Past Medical History  Diagnosis Date  . Cirrhosis ([place]) 2011    Cryptogenic, Likely NASH. Family/pt deny EtOH. HCV, HBV, HAV negative. ANA negative. AMA positive. Ascites 12/11  . Hyperlipemia   . Coronary artery disease     Inferior MI 12/11; LHC with occluded mid CFX and 80% proximal RCA. EF 55%. He had 3.0 x 28 vision BMS to CFX  . Diastolic CHF, acute  ([place])     Echo 12/11 with ef 50-55% and mild LVH. EF 55% by LV0gram in 12/11  . Hypertension   . Type II diabetes mellitus ([place]) 2011  . SVT (supraventricular tachycardia) ([place])     1/12: appeared to be an ectopic atrial tachycardia. Required DCCV with hemodynamic instability  . GI bleed     12/11: Etiology not clearly defined. EGD: nonbleeding esophageal varices.  . S/P coronary artery stent placement 06/2010  . Esophageal varices ([place]) 2011, 2013    no hx acute variceal bleed  . Hepatic encephalopathy ([place]) 2011, 12/2013  . Myocardial infarction [facility]) ? 2012  . Shortness of breath dyspnea   . Arthritis   . Hx of echocardiogram     Echo 5/16:  EF 55-60%, no RWMA, mod LAE  . Thrombocytopenia ([place])   . Cholelithiasis   . Rectal varices   . PFO (patent foramen ovale): Per TEE 04/20/2015 04/20/2015  . Bacteremia    Past Surgical History  Procedure Laterality Date  . Cardiac catheterization  07/01/2010    BMS to CFX.  Marland Kitchen Appendectomy    . Orif r leg    . Esophagogastroduodenoscopy  04/13/2012    Procedure: ESOPHAGOGASTRODUODENOSCOPY (EGD);  Surgeon: Inda Castle, MD;  Location: [facility] ENDOS[facility];  Service: Endoscopy;  Laterality: N/A;  . Colonoscopy  04/13/2012    Procedure: COLONOSCOPY;  Surgeon: Inda Castle, MD;  Location: [facility] ENDOSCOPY;  Service: Endoscopy;  Laterality: N/A;  . Esophagogastroduodenoscopy N/A 01/01/2014    Procedure: ESOPHAGOGASTRODUODENOSCOPY (EGD);  Surgeon: Jerene Bears, MD;  Location: [facility] ENDOSCOPY;  Service: Endoscopy;  Laterality: N/A;  . Esophageal banding N/A 01/01/2014    Procedure: ESOPHAGEAL BANDING;  Surgeon: Jerene Bears, MD;  Location: [facility] ENDOSCOPY;  Service: Endoscopy;  Laterality: N/A;  . Coronary stent placement  06/30/2010    CFX   Distal        . Left heart catheterization with coronary angiogram N/A 10/15/2014    Procedure: LEFT HEART CATHETERIZATION WITH CORONARY ANGIOGRAM;  Surgeon: Peter M Martinique, MD;  Location: [facility] CATH LAB;  Service:  Cardiovascular;  Laterality: N/A;  . Tee without cardioversion N/A 04/20/2015    Procedure: TRANSESOPHAGEAL ECHOCARDIOGRAM (TEE);  Surgeon: Fay Records, MD;  Location: [facility];  Service: Cardiovascular;  Laterality: N/A;    SOCIAL HISTORY:  reports that he has been smoking E-cigarettes.  He has a 36 pack-year smoking history. He has never used smokeless tobacco. He reports that he does not drink alcohol or use illicit drugs. Lives:  At home with wife   Assistive devices:   None needed for ambulation.   Allergies  Allergen Reactions  . Isosorbide Other (See Comments)    Nose bleeds  . Lactose Intolerance (Gi) Other (See Comments)    Family History  Problem Relation Age of Onset  . Heart attack Brother 36    MI  . Diabetes Brother   . Heart attack Father   . Diabetes Father   . COPD Father   . COPD Mother   . Stroke Neg Hx   . Heart attack Brother     Prior to Admission medications   Medication Sig Start Date End Date Taking? Authorizing Provider  aspirin 81 MG tablet Take 81 mg by mouth daily.     Historical Provider, MD  atorvastatin (LIPITOR) 10 MG tablet Take 10 mg by mouth daily.     Historical Provider, MD  furosemide (LASIX) 40 MG tablet Take 40 mg by mouth daily.  01/09/12   Larey Dresser, MD  insulin NPH Human (HUMULIN N,NOVOLIN N) 100 UNIT/ML injection Inject 0.35-0.8 mLs (35-80 Units total) into the skin See admin instructions. Take 80 units in the morning and 35 units in the evening. 04/20/15   Eugenie Filler, MD  lactulose (CHRONULAC) 10 GM/15ML solution Take 45 mLs (30 g total) by mouth 3 (three) times daily. May take an additional dose as needed for bowel movement 05/19/15   Jerene Bears, MD  metFORMIN (GLUCOPHAGE) 1000 MG tablet Take 1,000 mg by mouth 2 (two) times daily. 11/27/14   Historical Provider, MD  Multiple Vitamin (MULTIVITAMIN) capsule Take 1 capsule by mouth daily.      Historical Provider, MD  nadolol (CORGARD) 40 MG tablet Take 1 tablet (40 mg  total) by mouth daily. 01/01/15   Jerene Bears, MD  nitroGLYCERIN (NITROSTAT) 0.4 MG SL tablet Place 0.4 mg under the tongue every 5 (five) minutes as needed for chest pain (chest pain).    Historical Provider, MD  omeprazole (PRILOSEC) 20 MG capsule Take 1 capsule (20 mg total) by mouth daily. 01/15/15   Larey Dresser, MD  ranolazine (RANEXA) 500 MG 12 hr tablet Take 1 tablet (500 mg total) by mouth 2 (two) times daily. 06/10/15   Larey Dresser, MD  rifaximin (XIFAXAN) 550 MG TABS tablet Take 1 tablet (550 mg total) by mouth 2 (two) times daily. 02/13/15   Jerene Bears, MD  spironolactone (ALDACTONE) 25 MG tablet TAKE 1 TABLET EVERY DAY  05/13/15   Larey Dresser, MD   PHYSICAL EXAMDanley Danker Vitals:   06/14/15 0916 06/14/15 0934 06/14/15 1000 06/14/15 1130  BP: 124/64  102/78 112/57  Pulse: 76  67 64  Temp: 98.2 F (36.8 C) 98.2 F (36.8 C)    TempSrc: Axillary Rectal    Resp: 19  15 16   Height: 5' 10"  (1.778 m)     Weight: 113.399 kg (250 lb)     SpO2: 96%  95% 95%    Wt Readings from Last 3 Encounters:  06/14/15 113.399 kg (250 lb)  06/11/15 122.018 kg (269 lb)  06/08/15 125.193 kg (276 lb)    General:  Obese white male,arousable .  Eyes: PER, normal conjunctiva ENT: grossly normal lips & tongue Neck: no LAD, no masses Cardiovascular: RRR, no LE edema.  Respiratory: Respirations even and unlabored. Normal respiratory effort. Suboptimal evaluation given obtunded state.  Abdomen: soft, obese,  active bowel sounds. No obvious masses.  Skin: no rash seen on limited exam. Generalized bruising to abdomen Psychiatric: grossly normal mood and affect, speech fluent and appropriate Neurologic:  arousable but doesn't follow commands or open eyes, + asterixis.          LABS ON ADMISSION:    Basic Metabolic Panel:  Recent Labs Lab 06/14/15 0922  NA 136  K 4.5  CL 102  CO2 26  GLUCOSE 185*  BUN 7  CREATININE 0.90  CALCIUM 8.8*   Liver Function Tests:  Recent Labs Lab  06/14/15 0922  AST 75*  ALT 52  ALKPHOS 162*  BILITOT 1.5*  PROT 6.4*  ALBUMIN 2.5*    Recent Labs Lab 06/14/15 0922  AMMONIA 176*   CBC:  Recent Labs Lab 06/14/15 0922  WBC 5.1  HGB 12.0*  HCT 35.8*  MCV 100.0  PLT 58*    BNP (last 3 results)  Recent Labs  10/14/14 2248 06/05/15 2325  BNP 56.1 37.5    ProBNP (last 3 results)  Recent Labs  12/11/14 0913  PROBNP 38.0    CBG:  Recent Labs Lab 06/14/15 0953  GLUCAP 164*    CREATININE: 0.9 (06/14/15 0922) Estimated creatinine clearance - 111.5 mL/min  Radiological Exams on Admission: Ct Head Wo Contrast  06/14/2015  CLINICAL DATA:  History of cirrhosis, unable to wake up this morning or ambulate for Mariel Kansky. EXAM: CT HEAD WITHOUT CONTRAST TECHNIQUE: Contiguous axial images were obtained from the base of the skull through the vertex without intravenous contrast. COMPARISON:  Head CTs dated 05/07/2015 and 04/14/2015. FINDINGS: Again noted is mild generalized brain atrophy with commensurate dilatation of the ventricles and sulci. Patchy areas of low density within the bilateral periventricular and subcortical white matter regions are stable and most compatible with sequela of chronic small vessel ischemia. There is no mass, hemorrhage, edema, or other evidence of acute parenchymal abnormality. No extra-axial hemorrhage. There are chronic calcified atherosclerotic changes of the large vessels at the skull base. Osseous structures are unremarkable. Mild mucosal thickening noted within the right sphenoid sinus. Paranasal sinuses otherwise clear. Mastoid air cells are clear. Superficial soft tissues are unremarkable. IMPRESSION: 1. Mild atrophy and chronic ischemic changes in the white matter. 2. NO evidence of acute intracranial abnormality. NO intracranial mass, hemorrhage, or edema. 3. Mild sphenoid sinus disease, new compared to the previous exam. Electronically Signed   By: Franki Cabot M.D.   On: 06/14/2015 10:42    Dg Chest Portable 1 View  06/14/2015  CLINICAL DATA:  Altered mental status this morning.  EXAM: PORTABLE CHEST 1 VIEW COMPARISON:  Chest x-rays dated 06/05/2015 and 05/07/2015. FINDINGS: Study is hypoinspiratory with crowding of the perihilar and bibasilar bronchovascular markings. Given the low lung volumes, lungs are likely clear. No confluent opacity to suggest developing pneumonia. No pleural effusion seen. Cardiomediastinal silhouette appears stable in size and configuration. Osseous and soft tissue structures about the chest are unremarkable. IMPRESSION: Hypoinspiratory changes as above. Given the low lung volumes, no evidence of acute cardiopulmonary abnormality. Electronically Signed   By: Franki Cabot M.D.   On: 06/14/2015 10:54    ASSESSMENT / PLAN   Decompensated cryptogenic cirrhosis with recurrent hepatic encephalopathy, several admissions this year for the same. Etiology of recurrent hepatic encephalopathy unclear though he was bacteremic in November. Wife certain patient is compliant with Xifaxan. He takes lactulose as well but only on a when necessary basis.   -admit to stepdown -Patient obtunded, give Lactulose enemas. When taking PO, restart Xifaxan -AFP tumor marker -no previous history of ascites but will obtain ultrasound -hold all home meds until able to take PO -Will call [facility] today to evaluate patient in am   Chronic diastolic heart failure. Echo October 2016 - LVEF 55-60%. No evidence for decompensation    Hypertension. Stable   Type II diabetes with glycosuria today.  -hold oral diabetic medications -Monitor CBGs and start SSI  PFO. Large PFO found on TEE in October. Not candidate for anticoagulation for stroke prevention  CAD/ MI s/p bare metal stent 2011.        CONSULTANTS:   Gastroenterology - Dr. Ardis Hughs.   Code Status:  full code DVT Prophylaxis: SCDs (thrombocytopenia)  Family Communication:  Patient alert, oriented and understands plan of  care.    Disposition Plan: Discharge to home in  3-4 days   Time spent: 60 minutes Tye Savoy  NP [facility] Pager [phone]

## 2015-06-14 NOTE — ED Notes (Signed)
MD at bedside. 

## 2015-06-14 NOTE — ED Notes (Signed)
Ray MD made aware of pt

## 2015-06-14 NOTE — ED Provider Notes (Addendum)
CSN: [id number]     Arrival date & time 06/14/15  0908 History   First MD Initiated Contact with Patient 06/14/15 0919     Chief Complaint  Patient presents with  . Altered Mental Status   Level V caveat secondary to decreased mental status  (Consider location/radiation/quality/duration/timing/severity/associated sxs/prior Treatment) HPI 59 year old man with known liver failure who presents today with increased confusion. Patient brought in by EMS and on my evaluation no family is at bedside. Report is being EMS-Wife reports that when she woke this morning he was only moaning. She identifies this as similar to previous episodes of hepatic encephalopathy.he was last discharged November 27 after admission for multifactorial encephalopathy with slightly elevated ammonia, and probable infectious etiology. He had been doing well at home and I had reviewed the notes from the [place] and home health nurse 12:39 PM Patient's wife present at bedside. She states patient has been doing well since last hospitalization. She states that he may been a little slower yesterday however they went out to dinner and he appeared to be well. She noted that he was obtunded this morning. She does not cite any definite precipitating factors. She states that he did drink a Sprite last night. Otherwise he has been watching his diet and has been hydrating well. She has not noted any definite infection symptoms although he has been coughing somewhat recently. Past Medical History  Diagnosis Date  . Cirrhosis ([place]) 2011    Cryptogenic, Likely NASH. Family/pt deny EtOH. HCV, HBV, HAV negative. ANA negative. AMA positive. Ascites 12/11  . Hyperlipemia   . Coronary artery disease     Inferior MI 12/11; LHC with occluded mid CFX and 80% proximal RCA. EF 55%. He had 3.0 x 28 vision BMS to CFX  . Diastolic CHF, acute ([place])     Echo 12/11 with ef 50-55% and mild LVH. EF 55% by LV0gram in 12/11  . Hypertension   . Type II diabetes  mellitus ([place]) 2011  . SVT (supraventricular tachycardia) ([place])     1/12: appeared to be an ectopic atrial tachycardia. Required DCCV with hemodynamic instability  . GI bleed     12/11: Etiology not clearly defined. EGD: nonbleeding esophageal varices.  . S/P coronary artery stent placement 06/2010  . Esophageal varices ([place]) 2011, 2013    no hx acute variceal bleed  . Hepatic encephalopathy ([place]) 2011, 12/2013  . Myocardial infarction [facility]) ? 2012  . Shortness of breath dyspnea   . Arthritis   . Hx of echocardiogram     Echo 5/16:  EF 55-60%, no RWMA, mod LAE  . Thrombocytopenia ([place])   . Cholelithiasis   . Rectal varices   . PFO (patent foramen ovale): Per TEE 04/20/2015 04/20/2015  . Bacteremia    Past Surgical History  Procedure Laterality Date  . Cardiac catheterization  07/01/2010    BMS to CFX.  Marland Kitchen Appendectomy    . Orif r leg    . Esophagogastroduodenoscopy  04/13/2012    Procedure: ESOPHAGOGASTRODUODENOSCOPY (EGD);  Surgeon: Inda Castle, MD;  Location: [facility] END[facility];  Service: Endoscopy;  Laterality: N/A;  . Colonoscopy  04/13/2012    Procedure: COLONOSCOPY;  Surgeon: Inda Castle, MD;  Location: [facility] ENDOSCOPY;  Service: Endoscopy;  Laterality: N/A;  . Esophagogastroduodenoscopy N/A 01/01/2014    Procedure: ESOPHAGOGASTRODUODENOSCOPY (EGD);  Surgeon: Jerene Bears, MD;  Location: [facility] ENDOSCOPY;  Service: Endoscopy;  Laterality: N/A;  . Esophageal banding N/A 01/01/2014    Procedure: ESOPHAGEAL BANDING;  Surgeon: Jerene Bears, MD;  Location: [facility] ENDOSCOPY;  Service: Endoscopy;  Laterality: N/A;  . Coronary stent placement  06/30/2010    CFX   Distal        . Left heart catheterization with coronary angiogram N/A 10/15/2014    Procedure: LEFT HEART CATHETERIZATION WITH CORONARY ANGIOGRAM;  Surgeon: Peter M Martinique, MD;  Location: [facility] CATH LAB;  Service: Cardiovascular;  Laterality: N/A;  . Tee without cardioversion N/A 04/20/2015    Procedure: TRANSESOPHAGEAL ECHOCARDIOGRAM  (TEE);  Surgeon: Fay Records, MD;  Location[facility] ENDOSCOPY;  Service: Cardiovascular;  Laterality: N/A;   Family History  Problem Relation Age of Onset  . Heart attack Brother 15    MI  . Diabetes Brother   . Heart attack Father   . Diabetes Father   . COPD Father   . COPD Mother   . Stroke Neg Hx   . Heart attack Brother    Social History  Substance Use Topics  . Smoking status: Current Every Day Smoker -- 1.00 packs/day for 36 years    Types: E-cigarettes  . Smokeless tobacco: Never Used     Comment: uses vapor cigarettes (2016 )  . Alcohol Use: No    Review of Systems  Unable to perform ROS: Acuity of condition  Endocrine: Negative for polydipsia.      Allergies  Isosorbide and Lactose intolerance (gi)  Home Medications   Prior to Admission medications   Medication Sig Start Date End Date Taking? Authorizing Provider  aspirin 81 MG tablet Take 81 mg by mouth daily.     Historical Provider, MD  atorvastatin (LIPITOR) 10 MG tablet Take 10 mg by mouth daily.     Historical Provider, MD  furosemide (LASIX) 40 MG tablet Take 40 mg by mouth daily.  01/09/12   Larey Dresser, MD  insulin NPH Human (HUMULIN N,NOVOLIN N) 100 UNIT/ML injection Inject 0.35-0.8 mLs (35-80 Units total) into the skin See admin instructions. Take 80 units in the morning and 35 units in the evening. 04/20/15   Eugenie Filler, MD  lactulose (CHRONULAC) 10 GM/15ML solution Take 45 mLs (30 g total) by mouth 3 (three) times daily. May take an additional dose as needed for bowel movement 05/19/15   Jerene Bears, MD  metFORMIN (GLUCOPHAGE) 1000 MG tablet Take 1,000 mg by mouth 2 (two) times daily. 11/27/14   Historical Provider, MD  Multiple Vitamin (MULTIVITAMIN) capsule Take 1 capsule by mouth daily.      Historical Provider, MD  nadolol (CORGARD) 40 MG tablet Take 1 tablet (40 mg total) by mouth daily. 01/01/15   Jerene Bears, MD  nitroGLYCERIN (NITROSTAT) 0.4 MG SL tablet Place 0.4 mg under the tongue  every 5 (five) minutes as needed for chest pain (chest pain).    Historical Provider, MD  omeprazole (PRILOSEC) 20 MG capsule Take 1 capsule (20 mg total) by mouth daily. 01/15/15   Larey Dresser, MD  ranolazine (RANEXA) 500 MG 12 hr tablet Take 1 tablet (500 mg total) by mouth 2 (two) times daily. 06/10/15   Larey Dresser, MD  rifaximin (XIFAXAN) 550 MG TABS tablet Take 1 tablet (550 mg total) by mouth 2 (two) times daily. 02/13/15   Jerene Bears, MD  spironolactone (ALDACTONE) 25 MG tablet TAKE 1 TABLET EVERY DAY 05/13/15   Larey Dresser, MD   BP 112/57 mmHg  Pulse 64  Temp(Src) 98.2 F (36.8 C) (Rectal)  Resp 16  Ht 5'  10" (1.778 m)  Wt 113.399 kg  BMI 35.87 kg/m2  SpO2 95% Physical Exam  Constitutional:  Chronically ill-appearing morbidly obese male with decreased mental status  HENT:  Head: Normocephalic and atraumatic.  Right Ear: External ear normal.  Mucous membranes are dry  Eyes: Conjunctivae are normal.  Neck: Normal range of motion.  Cardiovascular: Normal rate and regular rhythm.   Pulmonary/Chest:  Diffuse decreased  sounds  Abdominal: Soft. Bowel sounds are normal.  Musculoskeletal: Normal range of motion. He exhibits edema.  Neurological:  Patient has his medicines to tactile stimuli otherwise is unresponsive to verbal stimuli appears to move all 4 extremities well  Skin: Skin is warm and intact. There is pallor.  Nursing note and vitals reviewed.   ED Course  Procedures (including critical care time) Labs Review Labs Reviewed  COMPREHENSIVE METABOLIC PANEL - Abnormal; Notable for the following:    Glucose, Bld 185 (*)    Calcium 8.8 (*)    Total Protein 6.4 (*)    Albumin 2.5 (*)    AST 75 (*)    Alkaline Phosphatase 162 (*)    Total Bilirubin 1.5 (*)    All other components within normal limits  CBC - Abnormal; Notable for the following:    RBC 3.58 (*)    Hemoglobin 12.0 (*)    HCT 35.8 (*)    Platelets 58 (*)    All other components within  normal limits  AMMONIA - Abnormal; Notable for the following:    Ammonia 176 (*)    All other components within normal limits  PROTIME-INR - Abnormal; Notable for the following:    Prothrombin Time 16.7 (*)    All other components within normal limits  URINALYSIS, ROUTINE W REFLEX MICROSCOPIC (NOT AT [facility]) - Abnormal; Notable for the following:    Color, Urine AMBER (*)    APPearance CLOUDY (*)    Glucose, UA 500 (*)    Hgb urine dipstick LARGE (*)    Bilirubin Urine SMALL (*)    Ketones, ur 15 (*)    All other components within normal limits  CBG MONITORING, ED - Abnormal; Notable for the following:    Glucose-Capillary 164 (*)    All other components within normal limits  I-STAT ARTERIAL BLOOD GAS, ED - Abnormal; Notable for the following:    pH, Arterial 7.482 (*)    pCO2 arterial 33.2 (*)    pO2, Arterial 67.0 (*)    Bicarbonate 24.8 (*)    All other components within normal limits  CULTURE, BLOOD (ROUTINE X 2)  CULTURE, BLOOD (ROUTINE X 2)  URINE MICROSCOPIC-ADD ON  CBG MONITORING, ED    Imaging Review Ct Head Wo Contrast  06/14/2015  CLINICAL DATA:  History of cirrhosis, unable to wake up this morning or ambulate for Mariel Kansky. EXAM: CT HEAD WITHOUT CONTRAST TECHNIQUE: Contiguous axial images were obtained from the base of the skull through the vertex without intravenous contrast. COMPARISON:  Head CTs dated 05/07/2015 and 04/14/2015. FINDINGS: Again noted is mild generalized brain atrophy with commensurate dilatation of the ventricles and sulci. Patchy areas of low density within the bilateral periventricular and subcortical white matter regions are stable and most compatible with sequela of chronic small vessel ischemia. There is no mass, hemorrhage, edema, or other evidence of acute parenchymal abnormality. No extra-axial hemorrhage. There are chronic calcified atherosclerotic changes of the large vessels at the skull base. Osseous structures are unremarkable. Mild mucosal  thickening noted within the right sphenoid sinus. Paranasal  sinuses otherwise clear. Mastoid air cells are clear. Superficial soft tissues are unremarkable. IMPRESSION: 1. Mild atrophy and chronic ischemic changes in the white matter. 2. NO evidence of acute intracranial abnormality. NO intracranial mass, hemorrhage, or edema. 3. Mild sphenoid sinus disease, new compared to the previous exam. Electronically Signed   By: Franki Cabot M.D.   On: 06/14/2015 10:42   Dg Chest Portable 1 View  06/14/2015  CLINICAL DATA:  Altered mental status this morning. EXAM: PORTABLE CHEST 1 VIEW COMPARISON:  Chest x-rays dated 06/05/2015 and 05/07/2015. FINDINGS: Study is hypoinspiratory with crowding of the perihilar and bibasilar bronchovascular markings. Given the low lung volumes, lungs are likely clear. No confluent opacity to suggest developing pneumonia. No pleural effusion seen. Cardiomediastinal silhouette appears stable in size and configuration. Osseous and soft tissue structures about the chest are unremarkable. IMPRESSION: Hypoinspiratory changes as above. Given the low lung volumes, no evidence of acute cardiopulmonary abnormality. Electronically Signed   By: Franki Cabot M.D.   On: 06/14/2015 10:54   I have personally reviewed and evaluated these images and lab results as part of my medical decision-making.   EKG Interpretation   Date/Time:  Sunday June 14 2015 09:16:06 EST Ventricular Rate:  75 PR Interval:  152 QRS Duration: 93 QT Interval:  348 QTC Calculation: 389 R Axis:   -13 Text Interpretation:  Normal sinus rhythm Confirmed by Gerasimos Plotts MD, Andee Poles  ([id number]) on 06/14/2015 12:19:46 PM      MDM   Final diagnoses:  Liver failure with hepatic coma, unspecified chronicity (HCC)  thrombocytopenia Elevated INR   59 year old man with known liver failure presents today with altered mental status. Workup here reveals ammonia of 176, head CT with no acute changes, no evidence of fever or  obvious infection. Given the patient's significantly elevated ammonia level, I think likely the alteration of mental status is secondary to hepatic encephalopathy.  INR elevated at 1.3.  Patient receiving iv fluids and lactulose ordered pr. Given  Unprovoked hepatic encephalopathy plan to add blood cultures and lactic acid. I do not think that empiric antibiotics are indicated at this time as patient afebrile, urine, and chest x-Pecola Haxton without any obvious source of infection. Discussed with hospitalist service and will admit to step down bed. CRITICAL CARE Performed by: Shaune Pollack Total critical care time: 60 minutes Critical care time was exclusive of separately billable procedures and treating other patients. Critical care was necessary to treat or prevent imminent or life-threatening deterioration. Critical care was time spent personally by me on the following activities: development of treatment plan with patient and/or surrogate as well as nursing, discussions with consultants, evaluation of patient's response to treatment, examination of patient, obtaining history from patient or surrogate, ordering and performing treatments and interventions, ordering and review of laboratory studies, ordering and review of radiographic studies, pulse oximetry and re-evaluation of patient's condition.   Pattricia Boss, MD 06/14/15 1241  Pattricia Boss, MD 06/14/15 [phone]

## 2015-06-14 NOTE — ED Notes (Signed)
Inpatient RN unavailable for report at this time.

## 2015-06-15 LAB — COMPREHENSIVE METABOLIC PANEL
ALK PHOS: 112 U/L (ref 38–126)
ALT: 48 U/L (ref 17–63)
AST: 62 U/L — AB (ref 15–41)
Albumin: 2.4 g/dL — ABNORMAL LOW (ref 3.5–5.0)
Anion gap: 4 — ABNORMAL LOW (ref 5–15)
BUN: 6 mg/dL (ref 6–20)
CALCIUM: 8.3 mg/dL — AB (ref 8.9–10.3)
CHLORIDE: 105 mmol/L (ref 101–111)
CO2: 27 mmol/L (ref 22–32)
CREATININE: 0.73 mg/dL (ref 0.61–1.24)
Glucose, Bld: 152 mg/dL — ABNORMAL HIGH (ref 65–99)
Potassium: 3.9 mmol/L (ref 3.5–5.1)
SODIUM: 136 mmol/L (ref 135–145)
Total Bilirubin: 1.5 mg/dL — ABNORMAL HIGH (ref 0.3–1.2)
Total Protein: 5.7 g/dL — ABNORMAL LOW (ref 6.5–8.1)

## 2015-06-15 LAB — GLUCOSE, CAPILLARY
GLUCOSE-CAPILLARY: 141 mg/dL — AB (ref 65–99)
GLUCOSE-CAPILLARY: 205 mg/dL — AB (ref 65–99)
Glucose-Capillary: 142 mg/dL — ABNORMAL HIGH (ref 65–99)
Glucose-Capillary: 131 mg/dL — ABNORMAL HIGH (ref 65–99)

## 2015-06-15 LAB — AFP TUMOR MARKER: AFP-Tumor Marker: 3.4 ng/mL (ref 0.0–8.3)

## 2015-06-15 LAB — HEMOGLOBIN A1C
Hgb A1c MFr Bld: 6.8 % — ABNORMAL HIGH (ref 4.8–5.6)
Mean Plasma Glucose: 148 mg/dL

## 2015-06-15 LAB — CBC
HCT: 34.4 % — ABNORMAL LOW (ref 39.0–52.0)
Hemoglobin: 11.7 g/dL — ABNORMAL LOW (ref 13.0–17.0)
MCH: 34 pg (ref 26.0–34.0)
MCHC: 34 g/dL (ref 30.0–36.0)
MCV: 100 fL (ref 78.0–100.0)
PLATELETS: 53 10*3/uL — AB (ref 150–400)
RBC: 3.44 MIL/uL — AB (ref 4.22–5.81)
RDW: 13.8 % (ref 11.5–15.5)
WBC: 4.2 10*3/uL (ref 4.0–10.5)

## 2015-06-15 MED ORDER — RANOLAZINE ER 500 MG PO TB12
500.0000 mg | ORAL_TABLET | Freq: Two times a day (BID) | ORAL | Status: DC
  Administered 2015-06-15 – 2015-06-16 (×2): 500 mg via ORAL
  Filled 2015-06-15 (×3): qty 1

## 2015-06-15 MED ORDER — ACETAMINOPHEN 500 MG PO TABS: 500.0000 mg | ORAL_TABLET | Freq: Four times a day (QID) | ORAL | Status: DC | PRN

## 2015-06-15 MED ORDER — METFORMIN HCL 500 MG PO TABS: 1000.0000 mg | ORAL_TABLET | Freq: Two times a day (BID) | ORAL | Status: DC

## 2015-06-15 MED ORDER — METFORMIN HCL 500 MG PO TABS
1000.0000 mg | ORAL_TABLET | Freq: Two times a day (BID) | ORAL | Status: DC
  Administered 2015-06-15: 1000 mg via ORAL
  Filled 2015-06-15: qty 2

## 2015-06-15 MED ORDER — INSULIN NPH (HUMAN) (ISOPHANE) 100 UNIT/ML ~~LOC~~ SUSP: 35.0000 [IU] | SUBCUTANEOUS | Status: DC

## 2015-06-15 MED ORDER — ACETAMINOPHEN 650 MG RE SUPP: 325.0000 mg | Freq: Four times a day (QID) | RECTAL | Status: DC | PRN

## 2015-06-15 MED ORDER — NADOLOL 40 MG PO TABS
40.0000 mg | ORAL_TABLET | Freq: Every day | ORAL | Status: DC
  Administered 2015-06-15 – 2015-06-16 (×2): 40 mg via ORAL
  Filled 2015-06-15 (×2): qty 1

## 2015-06-15 MED ORDER — ASPIRIN EC 81 MG PO TBEC
81.0000 mg | DELAYED_RELEASE_TABLET | Freq: Every day | ORAL | Status: DC
  Administered 2015-06-15 – 2015-06-16 (×2): 81 mg via ORAL
  Filled 2015-06-15 (×3): qty 1

## 2015-06-15 NOTE — Progress Notes (Signed)
UR COMPLETED  

## 2015-06-15 NOTE — Progress Notes (Signed)
[place] TEAM 1 - Stepdown/ICU TEAM PROGRESS NOTE  Adam Butler:[id number] DOB: 1955/08/04 DOA: 06/14/2015 PCP: [name], NP  Admit HPI / Brief Narrative: 59 y.o. male with multiple medical problems to include hypertension, coronary artery disease status post stent placement, PFO, and cryptogenic cirrhosis (likely [name]) who has had several admissions this year for hepatic encephalopathy. Patient brought into the emergency department with altered mental status. He was nearly obtunded. Patient has been compliant with rifaximin, he takes lactulose as needed (if doesn't have daily bowel movements) per wife. Patient had not been ill recently. He had not started any new medications.  HPI/Subjective: The patient has dramatically improved since the time of his admission.  He is alert oriented and interactive.  He denies chest pain shortness breath fevers chills nausea vomiting or abdominal pain.  Assessment/Plan:  Recurrent hepatic encephalopathy in cryptogenic cirrhosis  No clear cause for recurring decompensation - GI following - cont scheduled xifaxin and lactulose - f/u mental status in AM   Thrombocytopenia Due to cirrhosis - stable at present  Chronic diastolic heart failure TTE October 2016 w/ EF 55-60% - no clinical signs of volume overload at this time   Hypertension Blood pressure well controlled at this time  DM2  CBGs are reasonably controlled at this time - follow without change today  PFO Large PFO found on TEE in October - not felt to be a candidate for anticoagulation for stroke prevention  CAD / MI  s/p bare metal stent 2011- denies chest pain or anginal equivalent  Code Status: FULL Family Communication: Spoke with patient and his brother at the bedside Disposition Plan: transfer to medical bed - follow mental status   Consultants: Financial controller GI  Procedures: none  Antibiotics: none  DVT prophylaxis: SCDs  Objective: Blood pressure 102/47,  pulse 65, temperature 97.4 F (36.3 C), temperature source Oral, resp. rate 24, height 5' 7"  (1.702 m), weight 126.7 kg (279 lb 5.2 oz), SpO2 96 %.  Intake/Output Summary (Last 24 hours) at 06/15/15 1440 Last data filed at 06/15/15 1048  Gross per 24 hour  Intake 1178.33 ml  Output    750 ml  Net 428.33 ml   Exam: General: No acute respiratory distress Lungs: Clear to auscultation bilaterally without wheezes or crackles Cardiovascular: Regular rate and rhythm without murmur gallop or rub normal S1 and S2 Abdomen: Obese, nontender, nondistended, soft, bowel sounds positive, no rebound, no ascites, no appreciable mass Extremities: No significant cyanosis, clubbing, or edema bilateral lower extremities  Data Reviewed: Basic Metabolic Panel:  Recent Labs Lab 06/14/15 0922 06/15/15 0510  NA 136 136  K 4.5 3.9  CL 102 105  CO2 26 27  GLUCOSE 185* 152*  BUN 7 6  CREATININE 0.90 0.73  CALCIUM 8.8* 8.3*    CBC:  Recent Labs Lab 06/14/15 0922 06/15/15 0510  WBC 5.1 4.2  HGB 12.0* 11.7*  HCT 35.8* 34.4*  MCV 100.0 100.0  PLT 58* 53*    Liver Function Tests:  Recent Labs Lab 06/14/15 0922 06/15/15 0510  AST 75* 62*  ALT 52 48  ALKPHOS 162* 112  BILITOT 1.5* 1.5*  PROT 6.4* 5.7*  ALBUMIN 2.5* 2.4*    Recent Labs Lab 06/14/15 0922  AMMONIA 176*   Coags:  Recent Labs Lab 06/14/15 0944  INR 1.34   CBG:  Recent Labs Lab 06/14/15 0953 06/14/15 1622 06/14/15 2123 06/15/15 0742  GLUCAP 164* 116* 253* 141*    Recent Results (from the past 240 hour(s))  Urine culture     Status: None   Collection Time: 06/05/15  8:18 PM  Result Value Ref Range Status   Specimen Description URINE, RANDOM  Final   Special Requests NONE  Final   Culture 20,000 COLONIES/mL ENTEROCOCCUS SPECIES  Final   Report Status 06/08/2015 FINAL  Final   Organism ID, Bacteria ENTEROCOCCUS SPECIES  Final      Susceptibility   Enterococcus species - MIC*    AMPICILLIN <=2  SENSITIVE Sensitive     LEVOFLOXACIN 1 SENSITIVE Sensitive     NITROFURANTOIN <=16 SENSITIVE Sensitive     VANCOMYCIN 1 SENSITIVE Sensitive     * 20,000 COLONIES/mL ENTEROCOCCUS SPECIES  Blood culture (routine x 2)     Status: None   Collection Time: 06/05/15  8:47 PM  Result Value Ref Range Status   Specimen Description BLOOD RIGHT ARM  Final   Special Requests IN PEDIATRIC BOTTLE [place]  Final   Culture NO GROWTH 5 DAYS  Final   Report Status 06/10/2015 FINAL  Final  Blood culture (routine x 2)     Status: None   Collection Time: 06/05/15  8:54 PM  Result Value Ref Range Status   Specimen Description BLOOD RIGHT HAND  Final   Special Requests BOTTLES DRAWN AEROBIC AND ANAEROBIC 5CC  Final   Culture NO GROWTH 5 DAYS  Final   Report Status 06/10/2015 FINAL  Final  Blood culture (routine x 2)     Status: None (Preliminary result)   Collection Time: 06/14/15  9:50 AM  Result Value Ref Range Status   Specimen Description BLOOD RIGHT ANTECUBITAL  Final   Special Requests BOTTLES DRAWN AEROBIC AND ANAEROBIC 10CC  Final   Culture NO GROWTH 1 DAY  Final   Report Status PENDING  Incomplete  Blood culture (routine x 2)     Status: None (Preliminary result)   Collection Time: 06/14/15 10:00 AM  Result Value Ref Range Status   Specimen Description BLOOD RIGHT HAND  Final   Special Requests BOTTLES DRAWN AEROBIC AND ANAEROBIC 10CC  Final   Culture NO GROWTH 1 DAY  Final   Report Status PENDING  Incomplete  MRSA PCR Screening     Status: None   Collection Time: 06/14/15  3:52 PM  Result Value Ref Range Status   MRSA by PCR NEGATIVE NEGATIVE Final    Comment:        The GeneXpert MRSA Assay (FDA approved for NASAL specimens only), is one component of a comprehensive MRSA colonization surveillance program. It is not intended to diagnose MRSA infection nor to guide or monitor treatment for MRSA infections.      Studies:   Recent x-ray studies have been reviewed in detail by the  Attending Physician  Scheduled Meds:  Scheduled Meds: . insulin aspart  0-15 Units Subcutaneous TID WC  . lactulose  30 g Oral TID  . rifaximin  550 mg Oral BID  . sodium chloride  3 mL Intravenous Q12H    Time spent on care of this patient: 35 mins   MCCLUNG,JEFFREY T , MD   [facility] Office  [phone] Pager - Text Page per Shea Evans as per below:  On-Call/Text Page:      [email or web address]      password [email or web address]  If 7PM-7AM, please contact night-coverage [email or web address] Password [email or web address] 06/15/2015, 2:40 PM   LOS: 1 day

## 2015-06-15 NOTE — Consult Note (Addendum)
[facility] Consult: 9:24 AM 06/15/2015  LOS: 1 day    Referring Provider: [facility] Primary Care Physician:  [name], NP Primary Gastroenterologist:  Dr.Pyrtle   Reason for Consultation:  Hepatic encephalopathy.    HPI: Adam Butler is an obese 59 y.o. male.  Diastolic CHF (NYHA class II-III symptoms and volume overload a/w poor diet in 02/2011), type 2 DM.  CAD with 2011 MI and BMS placement to CFX.PFO. Plavix discontinued by cardiologist 01/2012. Transfusion requiring anemia 06/2010. Thrombocytopenia.  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 Hx hepatic vs metabollic encephalopathy and ascites in 2011 on Xifaxan. Portal and splenic vein thrombosis 12/2013.  10/2014 elevated lipase to 250 with gallstones on ultrasound and negative HIDA, suspected passed CBD stone.    11/25 - 06/07/15 admit with volume depletion, HE (ammonia only 49 but confused), elevated lactic acid.  ? Infectious process.  Admissions for HE on 11/3-11/6/16 and 10/27-10/28/16 Has been compliant with Rifaximin at home 10/3-10/10/16 admit for Sepsis, HCAP, strep Viridans bacteremia, AKI, lactic acidosis.  Discharged on 2 weeks Rocephin. Aldactone added.   Several other admits for encephalopathy this year.    05/15/2015 ultrasound: no significant ascites, unable to tap.  12/2013 EGD.  3 columns of small varices, flattened with insufflation.  No gastritis or duodenititis.  No gastric varices.  04/2012 EGD/colonoscopy in 04/2012: grade 4 esoph varices, duodenal ulcers, portal gastropathy; right sided portal hypertensive colopathy and rectal varices  Developed mental slowing, difficulty arousing.  Wife brought him to ED.  Ammonia level 176.   AST 60s, T bil 1.5: within  his normal range.  Na normal.  WBCs 4.2.  Platelets 53.  Hgb 11.7.    Head CT with chronic atrophy and white matter changes, nothing acute.   Pt has been taking lactulose and Rifaximin regularly.  Has 4 to 5 BMs per day.  No abd pain, no anorexia.  No dental pain, no fevers, no dysuria.   Past Medical History  Diagnosis Date  . Cirrhosis ([place]) 2011    Cryptogenic, Likely NASH. Family/pt deny EtOH. HCV, HBV, HAV negative. ANA negative. AMA positive. Ascites 12/11  . Hyperlipemia   . Coronary artery disease     Inferior MI 12/11; LHC with occluded mid CFX and 80% proximal RCA. EF 55%. He had 3.0 x 28 vision BMS to CFX  . Diastolic CHF, acute ([place])     Echo 12/11 with ef 50-55% and mild LVH. EF 55% by LV0gram in 12/11  . Hypertension   . Type II diabetes mellitus ([place]) 2011  . SVT (supraventricular tachycardia) ([place])     1/12: appeared to be an ectopic atrial tachycardia. Required DCCV with hemodynamic instability  . GI bleed     12/11: Etiology not clearly defined. EGD: nonbleeding esophageal varices.  . S/P coronary artery stent placement 06/2010  . Esophageal varices ([place]) 2011, 2013    no hx acute variceal bleed  . Hepatic encephalopathy ([place]) 2011, 12/2013  . Myocardial infarction [facility]) ? 2012  . Shortness of breath dyspnea   .  Arthritis   . Hx of echocardiogram     Echo 5/16:  EF 55-60%, no RWMA, mod LAE  . Thrombocytopenia ([place])   . Cholelithiasis   . Rectal varices   . PFO (patent foramen ovale): Per TEE 04/20/2015 04/20/2015  . Bacteremia     Past Surgical History  Procedure Laterality Date  . Cardiac catheterization  07/01/2010    BMS to CFX.  Marland Kitchen Appendectomy    . Orif r leg    . Esophagogastroduodenoscopy  04/13/2012    Procedure: ESOPHAGOGASTRODUODENOSCOPY (EGD);  Surgeon: Inda Castle, MD;  Location: [facility] END[facility]COPY;  Service: Endoscopy;  Laterality: N/A;  . Colonoscopy  04/13/2012    Procedure: COLONOSCOPY;  Surgeon: Inda Castle, MD;  Location: [facility] ENDOSCOPY;   Service: Endoscopy;  Laterality: N/A;  . Esophagogastroduodenoscopy N/A 01/01/2014    Procedure: ESOPHAGOGASTRODUODENOSCOPY (EGD);  Surgeon: Jerene Bears, MD;  [facility] END[facility]COPY;  Service: Endoscopy;  Laterality: N/A;  . Esophageal banding N/A 01/01/2014    Procedure: ESOPHAGEAL BANDING;  Surgeon: Jerene Bears, MD;  Location: [facility] ENDOSCOPY;  Service: Endoscopy;  Laterality: N/A;  . Coronary stent placement  06/30/2010    CFX   Distal        . Left heart catheterization with coronary angiogram N/A 10/15/2014    Procedure: LEFT HEART CATHETERIZATION WITH CORONARY ANGIOGRAM;  Surgeon: Peter M Martinique, MD;  Location: [facility] CATH LAB;  Service: Cardiovascular;  Laterality: N/A;  . Tee without cardioversion N/A 04/20/2015    Procedure: TRANSESOPHAGEAL ECHOCARDIOGRAM (TEE);  Surgeon: Fay Records, MD;  Location: [facility] ENDOSCOPY;  Service: Cardiovascular;  Laterality: N/A;    Prior to Admission medications   Medication Sig Start Date End Date Taking? Authorizing Provider  nitroGLYCERIN (NITROSTAT) 0.4 MG SL tablet Place 0.4 mg under the tongue every 5 (five) minutes as needed for chest pain (chest pain).   Yes Historical Provider, MD  aspirin 81 MG tablet Take 81 mg by mouth daily.     Historical Provider, MD  atorvastatin (LIPITOR) 10 MG tablet Take 10 mg by mouth daily.     Historical Provider, MD  furosemide (LASIX) 40 MG tablet Take 40 mg by mouth daily.  01/09/12   Larey Dresser, MD  insulin NPH Human (HUMULIN N,NOVOLIN N) 100 UNIT/ML injection Inject 0.35-0.8 mLs (35-80 Units total) into the skin See admin instructions. Take 80 units in the morning and 35 units in the evening. 04/20/15   Eugenie Filler, MD  lactulose (CHRONULAC) 10 GM/15ML solution Take 45 mLs (30 g total) by mouth 3 (three) times daily. May take an additional dose as needed for bowel movement 05/19/15   Jerene Bears, MD  metFORMIN (GLUCOPHAGE) 1000 MG tablet Take 1,000 mg by mouth 2 (two) times daily. 11/27/14   Historical Provider, MD    Multiple Vitamin (MULTIVITAMIN) capsule Take 1 capsule by mouth daily.      Historical Provider, MD  nadolol (CORGARD) 40 MG tablet Take 1 tablet (40 mg total) by mouth daily. 01/01/15   Jerene Bears, MD  omeprazole (PRILOSEC) 20 MG capsule Take 1 capsule (20 mg total) by mouth daily. 01/15/15   Larey Dresser, MD  ranolazine (RANEXA) 500 MG 12 hr tablet Take 1 tablet (500 mg total) by mouth 2 (two) times daily. 06/10/15   Larey Dresser, MD  rifaximin (XIFAXAN) 550 MG TABS tablet Take 1 tablet (550 mg total) by mouth 2 (two) times daily. 02/13/15   Jerene Bears, MD  spironolactone (  ALDACTONE) 25 MG tablet TAKE 1 TABLET EVERY DAY 05/13/15   Larey Dresser, MD    Scheduled Meds: . insulin aspart  0-15 Units Subcutaneous TID WC  . lactulose  30 g Oral TID  . rifaximin  550 mg Oral BID  . sodium chloride  3 mL Intravenous Q12H   Infusions: . sodium chloride 50 mL/hr at 06/15/15 0600   PRN Meds: acetaminophen **OR** acetaminophen, ondansetron **OR** ondansetron (ZOFRAN) IV   Allergies as of 06/14/2015 - Review Complete 06/14/2015  Allergen Reaction Noted  . Isosorbide Other (See Comments) 02/21/2015  . Lactose intolerance (gi) Other (See Comments) 05/02/2015    Family History  Problem Relation Age of Onset  . Heart attack Brother 61    MI  . Diabetes Brother   . Heart attack Father   . Diabetes Father   . COPD Father   . COPD Mother   . Stroke Neg Hx   . Heart attack Brother     Social History   Social History  . Marital Status: Married    Spouse Name: N/A  . Number of Children: 3  . Years of Education: N/A   Occupational History  . Unemployed Other    Worked in maintenance prior   Social History Main Topics  . Smoking status: Current Every Day Smoker -- 1.00 packs/day for 36 years    Types: E-cigarettes  . Smokeless tobacco: Never Used     Comment: uses vapor cigarettes (2016 )  . Alcohol Use: No  . Drug Use: No  . Sexual Activity: No   Other Topics Concern   . Not on file   Social History Narrative   Married   Gets regular exercise: walking    REVIEW OF SYSTEMS: Constitutional:  Weight stable.  Energy level low but stable ENT:  No nose bleeds, no dental pain.  No dentist  Pulm:  No SOB or cough CV:  No palpitations, no LE edema.  GU:  No hematuria, no frequency GI:  Per HPI Heme:  No excessive bleeding or bruising.    Transfusions:  Last was in 2012.  Neuro:  No headaches, no peripheral tingling or numbness Derm:  No itching, no rash or sores.  Endocrine:  No sweats or chills.  No polyuria or dysuria Immunization:  Not queried.  Travel:  None beyond local counties in last few months.    PHYSICAL EXAM: Vital signs in last 24 hours: Filed Vitals:   06/15/15 0300 06/15/15 0745  BP: 130/68 102/47  Pulse: 65   Temp:  97.4 F (36.3 C)  Resp: 24    Wt Readings from Last 3 Encounters:  06/14/15 126.7 kg (279 lb 5.2 oz)  06/11/15 122.018 kg (269 lb)  06/08/15 125.193 kg (276 lb)   General: pleasant, alert, comfortable.  Morbidly obese and ill looking Head:  No asymmetry, no head trauma  Eyes:  No icterus,no pallor Ears:  Not HOH  Nose:  No congestion or discharge Mouth:  Clear and moist.  Very poor teeth, lots missing and remaining are eaten up with caries Neck:  No mass, no TMG.   Lungs:  Greatly diminished globally.  No cough or resting dyspnea Heart: RRR.  No mrg.  S1/S2 audible Abdomen:  Soft, NT, obese,  Active BS.  Marland Kitchen   Rectal: deferred   Musc/Skeltl: no joint erythema or deformity Extremities:  No CCE  Neurologic:  Oriented x 3.  Moves all  4s.  No asterixis.   Skin:  No  telangectasia or rash or sores Tattoos:  none  Intake/Output from previous day: 12/04 0701 - 12/05 0700 In: 1178.3 [P.O.:340; I.V.:838.3] Out: 500 [Urine:500] Intake/Output this shift:    LAB RESULTS:  Recent Labs  06/14/15 0922 06/15/15 0510  WBC 5.1 4.2  HGB 12.0* 11.7*  HCT 35.8* 34.4*  PLT 58* 53*   BMET Lab Results  Component  Value Date   NA 136 06/15/2015   NA 136 06/14/2015   NA 136 06/07/2015   K 3.9 06/15/2015   K 4.5 06/14/2015   K 4.1 06/07/2015   CL 105 06/15/2015   CL 102 06/14/2015   CL 105 06/07/2015   CO2 27 06/15/2015   CO2 26 06/14/2015   CO2 22 06/07/2015   GLUCOSE 152* 06/15/2015   GLUCOSE 185* 06/14/2015   GLUCOSE 128* 06/07/2015   BUN 6 06/15/2015   BUN 7 06/14/2015   BUN 8 06/07/2015   CREATININE 0.73 06/15/2015   CREATININE 0.90 06/14/2015   CREATININE 0.78 06/07/2015   CALCIUM 8.3* 06/15/2015   CALCIUM 8.8* 06/14/2015   CALCIUM 8.7* 06/07/2015   LFT  Recent Labs  06/14/15 0922 06/15/15 0510  PROT 6.4* 5.7*  ALBUMIN 2.5* 2.4*  AST 75* 62*  ALT 52 48  ALKPHOS 162* 112  BILITOT 1.5* 1.5*   PT/INR Lab Results  Component Value Date   INR 1.34 06/14/2015   INR 1.42 05/15/2015   INR 1.34 05/07/2015   Hepatitis Panel No results for input(s): HEPBSAG, HCVAB, HEPAIGM, HEPBIGM in the last 72 hours. C-Diff No components found for: CDIFF Lipase     Component Value Date/Time   LIPASE 57* 05/07/2015 1333    Drugs of Abuse     Component Value Date/Time   LABOPIA NONE DETECTED 05/07/2015 1005   COCAINSCRNUR NONE DETECTED 05/07/2015 1005   LABBENZ NONE DETECTED 05/07/2015 1005   AMPHETMU NONE DETECTED 05/07/2015 1005   THCU NONE DETECTED 05/07/2015 1005   LABBARB NONE DETECTED 05/07/2015 1005     RADIOLOGY STUDIES: Ct Head Wo Contrast  06/14/2015  CLINICAL DATA:  History of cirrhosis, unable to wake up this morning or ambulate for Mariel Kansky. EXAM: CT HEAD WITHOUT CONTRAST TECHNIQUE: Contiguous axial images were obtained from the base of the skull through the vertex without intravenous contrast. COMPARISON:  Head CTs dated 05/07/2015 and 04/14/2015. FINDINGS: Again noted is mild generalized brain atrophy with commensurate dilatation of the ventricles and sulci. Patchy areas of low density within the bilateral periventricular and subcortical white matter regions are stable  and most compatible with sequela of chronic small vessel ischemia. There is no mass, hemorrhage, edema, or other evidence of acute parenchymal abnormality. No extra-axial hemorrhage. There are chronic calcified atherosclerotic changes of the large vessels at the skull base. Osseous structures are unremarkable. Mild mucosal thickening noted within the right sphenoid sinus. Paranasal sinuses otherwise clear. Mastoid air cells are clear. Superficial soft tissues are unremarkable. IMPRESSION: 1. Mild atrophy and chronic ischemic changes in the white matter. 2. NO evidence of acute intracranial abnormality. NO intracranial mass, hemorrhage, or edema. 3. Mild sphenoid sinus disease, new compared to the previous exam. Electronically Signed   By: Franki Cabot M.D.   On: 06/14/2015 10:42   Dg Chest Portable 1 View  06/14/2015  CLINICAL DATA:  Altered mental status this morning. EXAM: PORTABLE CHEST 1 VIEW COMPARISON:  Chest x-rays dated 06/05/2015 and 05/07/2015. FINDINGS: Study is hypoinspiratory with crowding of the perihilar and bibasilar bronchovascular markings. Given the low lung volumes, lungs are likely  clear. No confluent opacity to suggest developing pneumonia. No pleural effusion seen. Cardiomediastinal silhouette appears stable in size and configuration. Osseous and soft tissue structures about the chest are unremarkable. IMPRESSION: Hypoinspiratory changes as above. Given the low lung volumes, no evidence of acute cardiopulmonary abnormality. Electronically Signed   By: Franki Cabot M.D.   On: 06/14/2015 10:54    ENDOSCOPIC STUDIES: Per HPI  IMPRESSION:   *  Recurrent hepatic encephalopathy.   05/15/15 ultrasound with no ascites found.  So lower liklihood of subsequent ascites and ABP   *  cryptogenic cirrhosis.    *  Chronic thrombocytopenia.     PLAN:     *  ? Repeat ultrasound to look for ascites and if present, tap for SBP?   *  Note Pharm D suggestion to stop Metformin permanently  due to the recurrent HE.     Azucena Freed  06/15/2015, 9:24 AM Pager: [phone]      Attending physician's note   I have taken a history, examined the patient and reviewed the chart. I agree with the Advanced Practitioner's note, impression and recommendations. Recurrent hepatic encephalopathy without a clear cause for decompensation. Abd [place] 1 month ago did not show ascites. No evidence of UTI, PNA, sinusitis, etc. SBP not possible without ascites.  Continue Xifaxan 550 mg bid and lactulose tid. Titrate lactulose dosing for about 3-5 loose stools per day.  Optimize DM managment as improved blood glucose control should reduce risk of HE. Metformin use has been associated with improved control of HE in diabetic patients.  If he fails to rapidly improve would repeat Abd [place] and, if ascites present, perform paracentesis.  Consider adding probiotic VSL#3 for long term HE mgmt.  Outpatient GI follow up with Dr. Hilarie Fredrickson.  Lucio Edward, MD [facility] [phone] Mon-Fri 8a-5p [phone] after 5p, weekends, holidays

## 2015-06-15 NOTE — Progress Notes (Signed)
Inpatient Diabetes Program Recommendations  AACE/ADA: New Consensus Statement on Inpatient Glycemic Control (2015)  Target Ranges:  Prepandial:   less than 140 mg/dL      Peak postprandial:   less than 180 mg/dL (1-2 hours)      Critically ill patients:  140 - 180 mg/dL   Review of Glycemic Control  Diabetes history: DM 2 Outpatient Diabetes medications: NPH 80 units am, 35 units pm, Metformin 1,000 BID Current orders for Inpatient glycemic control: Novolog moderate TID  Inpatient Diabetes Program Recommendations: Oral Agents: Due to the recurrent Hepatic Encephalopathy, may want to consider permanently discontinuing the Metformin on home med list.   Thanks,  Tama Headings RN, MSN, [facility] Inpatient Diabetes Coordinator Team Pager [phone] (8a-5p)

## 2015-06-15 NOTE — Progress Notes (Signed)
Pt to TX to 6N-27, VSS, called report. Family present & aware of [place].

## 2015-06-15 NOTE — Progress Notes (Signed)
Pt transferred from 3S16 to 6N27.  Pt AAOX4.  Pt on RA. Pt wearing eye glasses.  PT has 20G IV to Lt AC SL.  Pt sitting in the chair.  Pt has no complaints at the moment.  Family and belongings to bedside.  Will continue to monitor.

## 2015-06-15 NOTE — Consult Note (Signed)
   [facility] CM Inpatient Consult   06/15/2015  Adam Butler Feb 06, 1956 [id number] Patient is currently active [up to admission] with [facility] for chronic disease management services with his [employer] Medicare plan..  Patient has been engaged by a [employer] .  Our community based plan of care has focused on disease management and community resource support.  Will continue to follow for progress and disposition/needs.   Made Inpatient Case Manager aware that [facility] following. Of note, [facility] Care Management services does not replace or interfere with any services that are needed or arranged by inpatient case management or social work.  For additional questions or referrals please contact: Natividad Brood, RN BSN [facility] Liaison  [phone] business mobile phone

## 2015-06-16 ENCOUNTER — Telehealth: Payer: Self-pay

## 2015-06-16 ENCOUNTER — Other Ambulatory Visit: Payer: Self-pay | Admitting: *Deleted

## 2015-06-16 ENCOUNTER — Telehealth: Payer: Self-pay | Admitting: Internal Medicine

## 2015-06-16 LAB — COMPREHENSIVE METABOLIC PANEL
ALBUMIN: 3 g/dL — AB (ref 3.5–5.0)
ALT: 56 U/L (ref 17–63)
AST: 75 U/L — AB (ref 15–41)
Alkaline Phosphatase: 122 U/L (ref 38–126)
Anion gap: 6 (ref 5–15)
BUN: 6 mg/dL (ref 6–20)
CHLORIDE: 104 mmol/L (ref 101–111)
CO2: 25 mmol/L (ref 22–32)
CREATININE: 0.83 mg/dL (ref 0.61–1.24)
Calcium: 9.2 mg/dL (ref 8.9–10.3)
GFR calc Af Amer: >60 mL/min (ref >60)
GLUCOSE: 166 mg/dL — AB (ref 65–99)
Potassium: 4 mmol/L (ref 3.5–5.1)
Sodium: 135 mmol/L (ref 135–145)
Total Bilirubin: 2.3 mg/dL — ABNORMAL HIGH (ref 0.3–1.2)
Total Protein: 7.2 g/dL (ref 6.5–8.1)

## 2015-06-16 LAB — GLUCOSE, CAPILLARY: Glucose-Capillary: 130 mg/dL — ABNORMAL HIGH (ref 65–99)

## 2015-06-16 LAB — CBC
HEMATOCRIT: 39.6 % (ref 39.0–52.0)
HEMOGLOBIN: 13.2 g/dL (ref 13.0–17.0)
MCH: 33.7 pg (ref 26.0–34.0)
MCHC: 33.3 g/dL (ref 30.0–36.0)
MCV: 101 fL — AB (ref 78.0–100.0)
PLATELETS: 81 10*3/uL — AB (ref 150–400)
RBC: 3.92 MIL/uL — AB (ref 4.22–5.81)
RDW: 14 % (ref 11.5–15.5)
WBC: 5.9 10*3/uL (ref 4.0–10.5)

## 2015-06-16 LAB — FOLATE: Folate: 29.3 ng/mL (ref >5.9)

## 2015-06-16 LAB — AMMONIA: AMMONIA: 59 umol/L — AB (ref 9–35)

## 2015-06-16 LAB — VITAMIN B12: Vitamin B-12: 1467 pg/mL — ABNORMAL HIGH (ref 180–914)

## 2015-06-16 MED ORDER — LACTULOSE 10 GM/15ML PO SOLN: 30.0000 g | Freq: Four times a day (QID) | ORAL | Status: DC

## 2015-06-16 NOTE — Discharge Summary (Signed)
Adam Butler, is a 59 y.o. male  DOB 1956/05/17  MRN [id number].  Admission date:  06/14/2015  Admitting Physician  Mechele Claude, DO  Discharge Date:  06/16/2015   Primary MD  [name], NP  Recommendations for primary care physician for things to follow:   Monitor CBC BMP closely, close outpatient GI follow-up for recurrent hepatic encephalopathy.   Admission Diagnosis  Liver failure with hepatic coma, unspecified chronicity (HCC) [K72.91]   Discharge Diagnosis  Liver failure with hepatic coma, unspecified chronicity ([place]) [K72.91]     Active Problems:   DIASTOLIC HEART FAILURE, CHRONIC   Cryptogenic cirrhosis (HCC)   Hepatic encephalopathy (HCC)   Encephalopathy, hepatic (HCC)      Past Medical History  Diagnosis Date  . Cirrhosis ([place]) 2011    Cryptogenic, Likely NASH. Family/pt deny EtOH. HCV, HBV, HAV negative. ANA negative. AMA positive. Ascites 12/11  . Hyperlipemia   . Coronary artery disease     Inferior MI 12/11; LHC with occluded mid CFX and 80% proximal RCA. EF 55%. He had 3.0 x 28 vision BMS to CFX  . Diastolic CHF, acute ([place])     Echo 12/11 with ef 50-55% and mild LVH. EF 55% by LV0gram in 12/11  . Hypertension   . Type II diabetes mellitus ([place]) 2011  . SVT (supraventricular tachycardia) ([place])     1/12: appeared to be an ectopic atrial tachycardia. Required DCCV with hemodynamic instability  . GI bleed     12/11: Etiology not clearly defined. EGD: nonbleeding esophageal varices.  . S/P coronary artery stent placement 06/2010  . Esophageal varices ([place]) 2011, 2013    no hx acute variceal bleed  . Hepatic encephalopathy ([place]) 2011, 12/2013  . Myocardial infarction [facility]) ? 2012  . Shortness of breath dyspnea   . Arthritis   . Hx of echocardiogram     Echo 5/16:  EF  55-60%, no RWMA, mod LAE  . Thrombocytopenia ([place])   . Cholelithiasis   . Rectal varices   . PFO (patent foramen ovale): Per TEE 04/20/2015 04/20/2015  . Bacteremia     Past Surgical History  Procedure Laterality Date  . Cardiac catheterization  07/01/2010    BMS to CFX.  Marland Kitchen Appendectomy    . Orif r leg    . Esophagogastroduodenoscopy  04/13/2012    Procedure: ESOPHAGOGASTRODUODENOSCOPY (EGD);  Surgeon: Inda Castle, MD;  Location: [facility];  Service: Endoscopy;  Laterality: N/A;  . Colonoscopy  04/13/2012    Procedure: COLONOSCOPY;  Surgeon: Inda Castle, MD;  Location: [facility] ENDOSCOPY;  Service: Endoscopy;  Laterality: N/A;  . Esophagogastroduodenoscopy N/A 01/01/2014    Procedure: ESOPHAGOGASTRODUODENOSCOPY (EGD);  Surgeon: Jerene Bears, MD;  Location: [facility] ENDOSCOPY;  Service: Endoscopy;  Laterality: N/A;  . Esophageal banding N/A 01/01/2014    Procedure: ESOPHAGEAL BANDING;  Surgeon: Jerene Bears, MD;  Location: [facility] ENDOSCOPY;  Service: Endoscopy;  Laterality: N/A;  . Coronary stent placement  06/30/2010    CFX   Distal        .  Left heart catheterization with coronary angiogram N/A 10/15/2014    Procedure: LEFT HEART CATHETERIZATION WITH CORONARY ANGIOGRAM;  Surgeon: Peter M Martinique, MD;  Location: [facility] CATH LAB;  Service: Cardiovascular;  Laterality: N/A;  . Tee without cardioversion N/A 04/20/2015    Procedure: TRANSESOPHAGEAL ECHOCARDIOGRAM (TEE);  Surgeon: Fay Records, MD;  Location: [facility] ENDOSCOPY;  Service: Cardiovascular;  Laterality: N/A;       HPI   :    59 y.o. male with multiple medical problems to include hypertension, coronary artery disease status post stent placement, PFO, and cryptogenic cirrhosis (likely [name]) who has had several admissions this year for hepatic encephalopathy. Patient brought into the emergency department with altered mental status. He was nearly obtunded. Patient has been compliant with rifaximin, he takes lactulose as needed (if doesn't have daily  bowel movements) per wife. Patient had not been ill recently. He had not started any new medications.   Hospital Course:     Recurrent hepatic encephalopathy in cryptogenic cirrhosis  No clear cause for recurring decompensation - GI following - cont scheduled xifaxin and lactulose - mentation back to normal he is ambulating in the hallway eager to go home, discussed his case with GI physician Dr. Lucio Edward who recommends that he be discharged home on Xifaxan home dose and increase his lactulose 45 mg 3 times a day to 4 times a day with close outpatient follow-up with PCP and Dr. Hilarie Fredrickson.    Thrombocytopenia Due to cirrhosis - stable at present  Chronic diastolic heart failure TTE October 2016 w/ EF 55-60% - no clinical signs of volume overload at this time, clinically compensated.  Hypertension Blood pressure well controlled at this time  DM2  CBGs are reasonably controlled at this time - continue home regimen with outpatient follow-up with PCP for glycemic control and A1c check.  PFO Large PFO found on TEE in October - not felt to be a candidate for anticoagulation for stroke prevention  CAD / MI  s/p bare metal stent 2011- denies chest pain or anginal equivalent, no acute issues continue home regimen unchanged.        Discharge Condition: Fair  Follow UP  Follow-up Information    Follow up with [name], NP. Schedule an appointment as soon as possible for a visit in 2 days.   Specialty:  Internal Medicine   Contact information:   [place] [address] [phone]       Follow up with Jerene Bears, MD. Schedule an appointment as soon as possible for a visit in 1 week.   Specialty:  Gastroenterology   Contact information:   [address] [place] [address] [phone]        Consults obtained - GI  Diet and Activity recommendation: See Discharge Instructions below  Discharge Instructions       Discharge  Instructions    Discharge instructions    Complete by:  As directed   Follow with Primary MD [name], NP in 2-3 days   Get CBC, CMP, 2 view Chest X ray checked  by Primary MD next visit.    Activity: As tolerated with Full fall precautions use walker/cane & assistance as needed   Disposition Home    Diet: Heart Healthy Low Carb.  Accuchecks 4 times/day, Once in AM empty stomach and then before each meal. Log in all results and show them to your Prim.MD in 3 days. If any glucose reading is under 80 or  above 300 call your Prim MD immidiately. Follow Low glucose instructions for glucose under 80 as instructed.   For Heart failure patients - Check your Weight same time everyday, if you gain over 2 pounds, or you develop in leg swelling, experience more shortness of breath or chest pain, call your Primary MD immediately. Follow Cardiac Low Salt Diet and 1.5 lit/day fluid restriction.   On your next visit with your primary care physician please Get Medicines reviewed and adjusted.   Please request your Prim.MD to go over all Hospital Tests and Procedure/Radiological results at the follow up, please get all Hospital records sent to your Prim MD by signing hospital release before you go home.   If you experience worsening of your admission symptoms, develop shortness of breath, life threatening emergency, suicidal or homicidal thoughts you must seek medical attention immediately by calling 911 or calling your MD immediately  if symptoms less severe.  You Must read complete instructions/literature along with all the possible adverse reactions/side effects for all the Medicines you take and that have been prescribed to you. Take any new Medicines after you have completely understood and accpet all the possible adverse reactions/side effects.   Do not drive, operating heavy machinery, perform activities at heights, swimming or participation in water activities or provide baby sitting  services if your were admitted for syncope or siezures until you have seen by Primary MD or a Neurologist and advised to do so again.  Do not drive when taking Pain medications.    Do not take more than prescribed Pain, Sleep and Anxiety Medications  Special Instructions: If you have smoked or chewed Tobacco  in the last 2 yrs please stop smoking, stop any regular Alcohol  and or any Recreational drug use.  Wear Seat belts while driving.   Please note  You were cared for by a hospitalist during your hospital stay. If you have any questions about your discharge medications or the care you received while you were in the hospital after you are discharged, you can call the unit and asked to speak with the hospitalist on call if the hospitalist that took care of you is not available. Once you are discharged, your primary care physician will handle any further medical issues. Please note that NO REFILLS for any discharge medications will be authorized once you are discharged, as it is imperative that you return to your primary care physician (or establish a relationship with a primary care physician if you do not have one) for your aftercare needs so that they can reassess your need for medications and monitor your lab values.     Increase activity slowly    Complete by:  As directed              Discharge Medications       Medication List    TAKE these medications        aspirin 81 MG tablet  Take 81 mg by mouth daily.     atorvastatin 10 MG tablet  Commonly known as:  LIPITOR  Take 10 mg by mouth daily.     furosemide 40 MG tablet  Commonly known as:  LASIX  Take 40 mg by mouth daily.     insulin NPH Human 100 UNIT/ML injection  Commonly known as:  HUMULIN N,NOVOLIN N  Inject 0.35-0.8 mLs (35-80 Units total) into the skin See admin instructions. Take 80 units in the morning and 35 units in the evening.  lactulose 10 GM/15ML solution  Commonly known as:  CHRONULAC  Take  45 mLs (30 g total) by mouth every 6 (six) hours. May take an additional dose as needed for bowel movement     metFORMIN 1000 MG tablet  Commonly known as:  GLUCOPHAGE  Take 1,000 mg by mouth 2 (two) times daily.     multivitamin capsule  Take 1 capsule by mouth daily.     nadolol 40 MG tablet  Commonly known as:  CORGARD  Take 1 tablet (40 mg total) by mouth daily.     nitroGLYCERIN 0.4 MG SL tablet  Commonly known as:  NITROSTAT  Place 0.4 mg under the tongue every 5 (five) minutes as needed for chest pain (chest pain).     omeprazole 20 MG capsule  Commonly known as:  PRILOSEC  Take 1 capsule (20 mg total) by mouth daily.     ranolazine 500 MG 12 hr tablet  Commonly known as:  RANEXA  Take 1 tablet (500 mg total) by mouth 2 (two) times daily.     rifaximin 550 MG Tabs tablet  Commonly known as:  XIFAXAN  Take 1 tablet (550 mg total) by mouth 2 (two) times daily.     spironolactone 25 MG tablet  Commonly known as:  ALDACTONE  TAKE 1 TABLET EVERY DAY        Major procedures and Radiology Reports - PLEASE review detailed and final reports for all details, in brief -       Dg Chest 2 View  06/05/2015  CLINICAL DATA:  Altered mental status and cough for 1 day. EXAM: CHEST  2 VIEW COMPARISON:  Radiograph 05/15/2015 FINDINGS: Normal cardiac silhouette. Small basilar pleural effusions. No pulmonary edema. No pneumothorax. No focal infiltrate. IMPRESSION: Small pleural effusions. Electronically Signed   By: Suzy Bouchard M.D.   On: 06/05/2015 17:24   Ct Head Wo Contrast  06/14/2015  CLINICAL DATA:  History of cirrhosis, unable to wake up this morning or ambulate for Mariel Kansky. EXAM: CT HEAD WITHOUT CONTRAST TECHNIQUE: Contiguous axial images were obtained from the base of the skull through the vertex without intravenous contrast. COMPARISON:  Head CTs dated 05/07/2015 and 04/14/2015. FINDINGS: Again noted is mild generalized brain atrophy with commensurate dilatation of the  ventricles and sulci. Patchy areas of low density within the bilateral periventricular and subcortical white matter regions are stable and most compatible with sequela of chronic small vessel ischemia. There is no mass, hemorrhage, edema, or other evidence of acute parenchymal abnormality. No extra-axial hemorrhage. There are chronic calcified atherosclerotic changes of the large vessels at the skull base. Osseous structures are unremarkable. Mild mucosal thickening noted within the right sphenoid sinus. Paranasal sinuses otherwise clear. Mastoid air cells are clear. Superficial soft tissues are unremarkable. IMPRESSION: 1. Mild atrophy and chronic ischemic changes in the white matter. 2. NO evidence of acute intracranial abnormality. NO intracranial mass, hemorrhage, or edema. 3. Mild sphenoid sinus disease, new compared to the previous exam. Electronically Signed   By: Franki Cabot M.D.   On: 06/14/2015 10:42   Dg Chest Portable 1 View  06/14/2015  CLINICAL DATA:  Altered mental status this morning. EXAM: PORTABLE CHEST 1 VIEW COMPARISON:  Chest x-rays dated 06/05/2015 and 05/07/2015. FINDINGS: Study is hypoinspiratory with crowding of the perihilar and bibasilar bronchovascular markings. Given the low lung volumes, lungs are likely clear. No confluent opacity to suggest developing pneumonia. No pleural effusion seen. Cardiomediastinal silhouette appears stable in size and configuration. Osseous  and soft tissue structures about the chest are unremarkable. IMPRESSION: Hypoinspiratory changes as above. Given the low lung volumes, no evidence of acute cardiopulmonary abnormality. Electronically Signed   By: Franki Cabot M.D.   On: 06/14/2015 10:54    Micro Results      Recent Results (from the past 240 hour(s))  Blood culture (routine x 2)     Status: None (Preliminary result)   Collection Time: 06/14/15  9:50 AM  Result Value Ref Range Status   Specimen Description BLOOD RIGHT ANTECUBITAL  Final    Special Requests BOTTLES DRAWN AEROBIC AND ANAEROBIC 10CC  Final   Culture NO GROWTH 1 DAY  Final   Report Status PENDING  Incomplete  Blood culture (routine x 2)     Status: None (Preliminary result)   Collection Time: 06/14/15 10:00 AM  Result Value Ref Range Status   Specimen Description BLOOD RIGHT HAND  Final   Special Requests BOTTLES DRAWN AEROBIC AND ANAEROBIC 10CC  Final   Culture NO GROWTH 1 DAY  Final   Report Status PENDING  Incomplete  MRSA PCR Screening     Status: None   Collection Time: 06/14/15  3:52 PM  Result Value Ref Range Status   MRSA by PCR NEGATIVE NEGATIVE Final    Comment:        The GeneXpert MRSA Assay (FDA approved for NASAL specimens only), is one component of a comprehensive MRSA colonization surveillance program. It is not intended to diagnose MRSA infection nor to guide or monitor treatment for MRSA infections.        Today   Subjective    Adam Butler today has no headache,no chest abdominal pain,no new weakness tingling or numbness, feels much better wants to go home today.     Objective   Blood pressure 122/71, pulse 58, temperature 97.8 F (36.6 C), temperature source Oral, resp. rate 20, height 5' 7"  (1.702 m), weight 126.7 kg (279 lb 5.2 oz), SpO2 100 %.   Intake/Output Summary (Last 24 hours) at 06/16/15 0929 Last data filed at 06/16/15 0458  Gross per 24 hour  Intake   2290 ml  Output   1775 ml  Net    515 ml    Exam Awake Alert, Oriented x 3, No new F.N deficits, Normal affect [place].AT,PERRAL Supple Neck,No JVD, No cervical lymphadenopathy appriciated.  Symmetrical Chest wall movement, Good air movement bilaterally, CTAB RRR,No Gallops,Rubs or new Murmurs, No Parasternal Heave +ve B.Sounds, Abd Soft, Non tender, No organomegaly appriciated, No rebound -guarding or rigidity. No Cyanosis, Clubbing or edema, No new Rash or bruise   Data Review   CBC w Diff: Lab Results  Component Value Date   WBC 5.9 06/16/2015    HGB 13.2 06/16/2015   HCT 39.6 06/16/2015   PLT 81* 06/16/2015   LYMPHOPCT 12 05/14/2015   MONOPCT 12 05/14/2015   EOSPCT 1 05/14/2015   BASOPCT 1 05/14/2015    CMP: Lab Results  Component Value Date   NA 135 06/16/2015   K 4.0 06/16/2015   CL 104 06/16/2015   CO2 25 06/16/2015   BUN 6 06/16/2015   CREATININE 0.83 06/16/2015   CREATININE 0.74 03/15/2011   PROT 7.2 06/16/2015   ALBUMIN 3.0* 06/16/2015   BILITOT 2.3* 06/16/2015   ALKPHOS 122 06/16/2015   AST 75* 06/16/2015   ALT 56 06/16/2015  .   Total Time in preparing paper work, data evaluation and todays exam - 35 minutes  Thurnell Lose M.D on 06/16/2015  at 9:29 AM  [facility]   Office  [phone]

## 2015-06-16 NOTE — Progress Notes (Signed)
Brain Suchecki to be D/C'd  per MD order. Discussed with the patient and all questions fully answered.  VSS, Skin clean, dry and intact without evidence of skin break down, no evidence of skin tears noted.  IV catheter discontinued intact. Site without signs and symptoms of complications. Dressing and pressure applied.  An After Visit Summary was printed and given to the patient. Patient received prescription.  D/c education completed with patient/family including follow up instructions, medication list, d/c activities limitations if indicated, with other d/c instructions as indicated by MD - patient able to verbalize understanding, all questions fully answered.   Patient instructed to return to ED, call 911, or call MD for any changes in condition.   Patient to be escorted via [place], and D/C home via private auto.

## 2015-06-16 NOTE — Telephone Encounter (Signed)
Pt scheduled to see Dr. Hilarie Fredrickson 06/24/15@2 :45pm. Pt aware of appt.

## 2015-06-16 NOTE — Discharge Instructions (Signed)
Follow with Primary MD [name], NP in 2-3 days   Get CBC, CMP, 2 view Chest X ray checked  by Primary MD next visit.    Activity: As tolerated with Full fall precautions use walker/cane & assistance as needed   Disposition Home    Diet: Heart Healthy Low Carb.  Accuchecks 4 times/day, Once in AM empty stomach and then before each meal. Log in all results and show them to your Prim.MD in 3 days. If any glucose reading is under 80 or above 300 call your Prim MD immidiately. Follow Low glucose instructions for glucose under 80 as instructed.   For Heart failure patients - Check your Weight same time everyday, if you gain over 2 pounds, or you develop in leg swelling, experience more shortness of breath or chest pain, call your Primary MD immediately. Follow Cardiac Low Salt Diet and 1.5 lit/day fluid restriction.   On your next visit with your primary care physician please Get Medicines reviewed and adjusted.   Please request your Prim.MD to go over all Hospital Tests and Procedure/Radiological results at the follow up, please get all Hospital records sent to your Prim MD by signing hospital release before you go home.   If you experience worsening of your admission symptoms, develop shortness of breath, life threatening emergency, suicidal or homicidal thoughts you must seek medical attention immediately by calling 911 or calling your MD immediately  if symptoms less severe.  You Must read complete instructions/literature along with all the possible adverse reactions/side effects for all the Medicines you take and that have been prescribed to you. Take any new Medicines after you have completely understood and accpet all the possible adverse reactions/side effects.   Do not drive, operating heavy machinery, perform activities at heights, swimming or participation in water activities or provide baby sitting services if your were admitted for syncope or siezures until you have seen  by Primary MD or a Neurologist and advised to do so again.  Do not drive when taking Pain medications.    Do not take more than prescribed Pain, Sleep and Anxiety Medications  Special Instructions: If you have smoked or chewed Tobacco  in the last 2 yrs please stop smoking, stop any regular Alcohol  and or any Recreational drug use.  Wear Seat belts while driving.   Please note  You were cared for by a hospitalist during your hospital stay. If you have any questions about your discharge medications or the care you received while you were in the hospital after you are discharged, you can call the unit and asked to speak with the hospitalist on call if the hospitalist that took care of you is not available. Once you are discharged, your primary care physician will handle any further medical issues. Please note that NO REFILLS for any discharge medications will be authorized once you are discharged, as it is imperative that you return to your primary care physician (or establish a relationship with a primary care physician if you do not have one) for your aftercare needs so that they can reassess your need for medications and monitor your lab values.

## 2015-06-16 NOTE — Progress Notes (Signed)
apts make Baity.NP 12/7 1pm Dr. Hilarie Fredrickson Jun 23 1244

## 2015-06-16 NOTE — Telephone Encounter (Signed)
Received call from Bradd Canary NP with [facility]. Pt is back in the hospital, states he is having to be hospitalized more frequently for LOC changes. States she spoke with Dr. Hilarie Fredrickson last week and he mentioned a referral with [facility] regarding liver transplant. She wanted to know if this had been set up and she also asked about a palliative care referral so that he could be treated at home instead of having to come to the hospital so often. Please advise.

## 2015-06-16 NOTE — Telephone Encounter (Signed)
See notes below from Dr. Hilarie Fredrickson.

## 2015-06-16 NOTE — Evaluation (Addendum)
Physical Therapy Evaluation Patient Details Name: Adam Butler MRN: [id number] DOB: 08-03-1955 Today's Date: 06/16/2015   History of Present Illness  HPI: Adam Butler is a 59 y.o. male with multiple medical problems not limited to hypertension, coronary artery disease status post stent placement, PFO, and cryptogenic cirrhosis (likely [name]) disease. Patient has had several admissions this year for hepatic encephalopathy. Patient brought into the emergency department with altered mental status. He is nearly obtunded, wife gives history.   Clinical Impression   Patient evaluated by Physical Therapy with no further acute PT needs identified. All education has been completed and the patient has no further questions.  See below for any follow-up Physical Therapy or equipment needs. PT is signing off. Thank you for this referral.     Follow Up Recommendations No PT follow up;Supervision - Intermittent  Recommend continuing [place] for chronic disease management    Equipment Recommendations  None recommended by PT    Recommendations for Other Services       Precautions / Restrictions Precautions Precautions: None      Mobility  Bed Mobility                  Transfers Overall transfer level: Modified independent Equipment used: None             General transfer comment: used arm rests, steady  Ambulation/Gait Ambulation/Gait assistance: Modified independent (Device/Increase time) Ambulation Distance (Feet): 520 Feet Assistive device: Rolling walker (2 wheeled);None Gait Pattern/deviations: Step-through pattern;WFL(Within Functional Limits) Gait velocity: wfl   General Gait Details: Pt choosing to use RW, feels steadier in non-home environment with bil UE support  Stairs Stairs: Yes Stairs assistance: Supervision Stair Management: One rail Left;Forwards;Step to pattern Number of Stairs: 7 General stair comments: Managing well  Wheelchair Mobility     Modified Rankin (Stroke Patients Only)       Balance                                             Pertinent Vitals/Pain Pain Assessment: No/denies pain    Home Living Family/patient expects to be discharged to:: Private residence Living Arrangements: Spouse/significant other Available Help at Discharge: Family;Available PRN/intermittently Type of Home: House Home Access: Stairs to enter Entrance Stairs-Rails: Right;Left;Can reach both Entrance Stairs-Number of Steps: 2 Home Layout: One level Home Equipment: Walker - 2 wheels;Cane - single point      Prior Function Level of Independence: Independent         Comments: Uses cane at times if walking long distance.     Hand Dominance   Dominant Hand: Right    Extremity/Trunk Assessment   Upper Extremity Assessment: Overall WFL for tasks assessed           Lower Extremity Assessment: Overall WFL for tasks assessed      Cervical / Trunk Assessment: Normal  Communication   Communication: No difficulties  Cognition Arousal/Alertness: Awake/alert Behavior During Therapy: WFL for tasks assessed/performed Overall Cognitive Status: Within Functional Limits for tasks assessed                      General Comments      Exercises        Assessment/Plan    PT Assessment Patent does not need any further PT services  PT Diagnosis Altered mental status   PT Problem List  PT Treatment Interventions     PT Goals (Current goals can be found in the Care Plan section) Acute Rehab PT Goals Patient Stated Goal: home today PT Goal Formulation: All assessment and education complete, DC therapy    Frequency     Barriers to discharge        Co-evaluation               End of Session   Activity Tolerance: Patient tolerated treatment well Patient left: Other (comment) (standing n room, discussing dc with RN) Nurse Communication: Mobility status         Time:  4696-2952 PT Time Calculation (min) (ACUTE ONLY): 9 min   Charges:   PT Evaluation $Initial PT Evaluation Tier I: 1 Procedure     PT G CodesRoney Marion Hamff 06/16/2015, 10:19 AM  Roney Marion, PT  Acute Rehabilitation Services Pager [phone] Office [phone]

## 2015-06-16 NOTE — Telephone Encounter (Signed)
Referral was placed on 06/10/15 to [facility] liver clinic. I contacted Waldon Merl today at [facility]. She has patient scheduled for 06/23/15 @ 2:30 pm with a 2:00 pm arrival. She is trying to contact patient to advise of this. I have spoken to patient this afternoon to advise of appointment and he verbalizes understanding.

## 2015-06-16 NOTE — Telephone Encounter (Signed)
Yes, urgent referral to [facility] liver clinic for NASH, ESLD and freq PSE (Dottie had previously been working on this) Would do this before palliative care

## 2015-06-16 NOTE — Patient Outreach (Signed)
Case discussion for pt regarding frequent readmissions for hepatic encephalopathy. Update given by myself and input from [facility] team including Dr. Nehemiah Settle. Plan to follow up with Dr. Hilarie Fredrickson on status of consultation with [facility] transplant team and to seek [place] for palliative care referral.  I have called Dr. Quentin Mulling office and given pt information and requested call back with date consultation scheduled and [place] for Palliative Care referral now to possible avoid admissions and treat pt at home.  I will be contacting Natividad Brood, [facility] Liaison, to arrange inpt palliative care consult.  [name] [facility] [place] [phone]

## 2015-06-17 ENCOUNTER — Encounter: Payer: Self-pay | Admitting: *Deleted

## 2015-06-17 ENCOUNTER — Ambulatory Visit (INDEPENDENT_AMBULATORY_CARE_PROVIDER_SITE_OTHER): Payer: PPO | Admitting: Internal Medicine

## 2015-06-17 ENCOUNTER — Telehealth: Payer: Self-pay | Admitting: *Deleted

## 2015-06-17 VITALS — BP 104/60 | HR 66 | Temp 97.8°F | Wt 263.5 lb

## 2015-06-17 DIAGNOSIS — K7682 Hepatic encephalopathy: Secondary | ICD-10-CM

## 2015-06-17 DIAGNOSIS — K7290 Hepatic failure, unspecified without coma: Secondary | ICD-10-CM | POA: Diagnosis not present

## 2015-06-17 DIAGNOSIS — K7581 Nonalcoholic steatohepatitis (NASH): Secondary | ICD-10-CM | POA: Diagnosis not present

## 2015-06-17 DIAGNOSIS — K7460 Unspecified cirrhosis of liver: Secondary | ICD-10-CM | POA: Diagnosis not present

## 2015-06-17 NOTE — Addendum Note (Signed)
Addended by: Marchia Bond on: 06/17/2015 01:36 PM   Modules accepted: Orders

## 2015-06-17 NOTE — Patient Instructions (Signed)
Hepatic Encephalopathy Hepatic encephalopathy is a loss of brain function from advanced liver disease. The effects of the condition depend on the type of liver damage and how severe it is. In some cases, hepatic encephalopathy can be reversed. CAUSES The exact cause of hepatic encephalopathy is not known. RISK FACTORS You have a higher risk of getting this condition if your liver is damaged. When the liver is damaged harmful substances called toxins can build up in the body. Certain toxins, such as ammonia, can harm your brain. Conditions that can cause liver damage include:  An infection.  Dehydration.  Intestinal bleeding.  Drinking too much alcohol.  Taking certain medicines, including tranquilizers, water pills (diuretics), antidepressants, or sleeping pills. SIGNS AND SYMPTOMS Signs and symptoms may develop suddenly. Or, they may develop slowly and get worse gradually. Symptoms can range from mild to severe. Mild Hepatic Encephalopathy  Mild confusion.  Personality and mood changes.  Anxiety and agitation.  Drowsiness.  Loss of mental abilities.  Musty or sweet-smelling breath. Worsening or Severe Hepatic Encephalopathy  Slowed movement.  Slurred speech.  Extreme personality changes.  Disorientation.  Abnormal shaking or flapping of the hands.  Coma. DIAGNOSIS To make a diagnosis, your health care provider will do a physical exam. To rule out other causes of your signs and symptoms, he or she may order tests. You may have:  Blood tests. These may be done to check your ammonia level, measure how long it takes your blood to clot, and check for infection.  Liver function tests. These may be done to check how well your liver is working.  MRI and CT scans. These may be done to check for a brain disorder.  Electroencephalogram (EEG). This may be done to measure the electrical activity in your brain. TREATMENT The first step in treatment is identifying and  treating possible triggers. The next step is involves taking medicine to lower the level of toxins in the body and to prevent ammonia from building up. You may need to take:  Antibiotics to reduce the ammonia-producing bacteria in your gut.  Lactulose to help flush ammonia from the gut. HOME CARE INSTRUCTIONS Eating and Drinking  Follow a low-protein diet that includes plenty of fruits, vegetables, and whole grains, as directed by your health care provider. Ammonia is produced when you digest high-protein foods.  Work with a Microbiologist or with your health care provider to make sure you are getting the right balance of protein and minerals.  Drink enough fluids to keep your urine clear or pale yellow. Drinking plenty of water helps prevent constipation.  Do not drink alcohol or use illegal drugs. Medicines  Only take medicine as directed by your health care provider.  If you were prescribed an antibiotic medicine, finish it all even if you start to feel better.  Do not start any new medicines, including over-the-counter medicines, without first checking with your health care provider. SEEK MEDICAL CARE IF:  You have new symptoms.  Your symptoms change.  Your symptoms get worse.  You have a fever.  You are constipated.  You have persistent nausea, vomiting, or diarrhea. SEEK IMMEDIATE MEDICAL CARE IF:  You become very confused or drowsy.  You vomit blood or material that looks like coffee grounds.  Your stool is bloody or black or looks like tar.   This information is not intended to replace advice given to you by your health care provider. Make sure you discuss any questions you have with your health care provider.  Document Released: 09/06/2006 Document Revised: 07/18/2014 Document Reviewed: 02/12/2014 Elsevier Interactive Patient Education [employer].

## 2015-06-17 NOTE — Progress Notes (Signed)
Subjective:    Patient ID: Adam Butler, male    DOB: [birth year], 59 y.o.   MRN: [id number]  HPI  Pt presents to the clinic today for hospital followup. He was admitted 12/4 for AMS. His wife was concerned that he had hepatic encephalopathy (for which he has been hospitalized numerous times for). He does have NASH. His ammonia level was 176. He is on Xifaxin in addition to his Lactulose. He was discharged 12/6. Since his discharge, he reports he has been feeling well. His only had 1 BM today. He has already taken his Lactulose twice today. He has not noticed any confusion.  He has a follow up with Dr. Hilarie Fredrickson 12/14. He reports Dr. Hilarie Fredrickson will be bringing in a specialist to help him. He is also being followup by [facility] care management for multiple admissions.  Review of Systems      Past Medical History  Diagnosis Date  . Cirrhosis ([place]) 2011    Cryptogenic, Likely NASH. Family/pt deny EtOH. HCV, HBV, HAV negative. ANA negative. AMA positive. Ascites 12/11  . Hyperlipemia   . Coronary artery disease     Inferior MI 12/11; LHC with occluded mid CFX and 80% proximal RCA. EF 55%. He had 3.0 x 28 vision BMS to CFX  . Diastolic CHF, acute ([place])     Echo 12/11 with ef 50-55% and mild LVH. EF 55% by LV0gram in 12/11  . Hypertension   . Type II diabetes mellitus ([place]) 2011  . SVT (supraventricular tachycardia) ([place])     1/12: appeared to be an ectopic atrial tachycardia. Required DCCV with hemodynamic instability  . GI bleed     12/11: Etiology not clearly defined. EGD: nonbleeding esophageal varices.  . S/P coronary artery stent placement 06/2010  . Esophageal varices ([place]) 2011, 2013    no hx acute variceal bleed  . Hepatic encephalopathy ([place]) 2011, 12/2013  . Myocardial infarction [facility]) ? 2012  . Shortness of breath dyspnea   . Arthritis   . Hx of echocardiogram     Echo 5/16:  EF 55-60%, no RWMA, mod LAE  . Thrombocytopenia ([place])   . Cholelithiasis   . Rectal varices   . PFO  (patent foramen ovale): Per TEE 04/20/2015 04/20/2015  . Bacteremia     Current Outpatient Prescriptions  Medication Sig Dispense Refill  . aspirin 81 MG tablet Take 81 mg by mouth daily.     Marland Kitchen atorvastatin (LIPITOR) 10 MG tablet Take 10 mg by mouth daily.     . furosemide (LASIX) 40 MG tablet Take 40 mg by mouth daily.     . insulin NPH Human (HUMULIN N,NOVOLIN N) 100 UNIT/ML injection Inject 0.35-0.8 mLs (35-80 Units total) into the skin See admin instructions. Take 80 units in the morning and 35 units in the evening. 10 mL 0  . lactulose (CHRONULAC) 10 GM/15ML solution Take 45 mLs (30 g total) by mouth every 6 (six) hours. May take an additional dose as needed for bowel movement 1350 mL 2  . metFORMIN (GLUCOPHAGE) 1000 MG tablet Take 1,000 mg by mouth 2 (two) times daily.  11  . Multiple Vitamin (MULTIVITAMIN) capsule Take 1 capsule by mouth daily.      . nadolol (CORGARD) 40 MG tablet Take 1 tablet (40 mg total) by mouth daily. 30 tablet 5  . nitroGLYCERIN (NITROSTAT) 0.4 MG SL tablet Place 0.4 mg under the tongue every 5 (five) minutes as needed for chest pain (chest pain).    Marland Kitchen  omeprazole (PRILOSEC) 20 MG capsule Take 1 capsule (20 mg total) by mouth daily. 30 capsule 6  . ranolazine (RANEXA) 500 MG 12 hr tablet Take 1 tablet (500 mg total) by mouth 2 (two) times daily.    . rifaximin (XIFAXAN) 550 MG TABS tablet Take 1 tablet (550 mg total) by mouth 2 (two) times daily. 24 tablet 0  . spironolactone (ALDACTONE) 25 MG tablet TAKE 1 TABLET EVERY DAY 30 tablet 11   No current facility-administered medications for this visit.    Allergies  Allergen Reactions  . Isosorbide Other (See Comments)    Nose bleeds  . Lactose Intolerance (Gi) Other (See Comments)    Family History  Problem Relation Age of Onset  . Heart attack Brother 77    MI  . Diabetes Brother   . Heart attack Father   . Diabetes Father   . COPD Father   . COPD Mother   . Stroke Neg Hx   . Heart attack Brother       Social History   Social History  . Marital Status: Married    Spouse Name: N/A  . Number of Children: 3  . Years of Education: N/A   Occupational History  . Unemployed Other    Worked in maintenance prior   Social History Main Topics  . Smoking status: Current Every Day Smoker -- 1.00 packs/day for 36 years    Types: E-cigarettes  . Smokeless tobacco: Never Used     Comment: uses vapor cigarettes (2016 )  . Alcohol Use: No  . Drug Use: No  . Sexual Activity: No   Other Topics Concern  . Not on file   Social History Narrative   Married   Gets regular exercise: walking     Constitutional: Pt reports fatigue. Denies fever, malaise,, headache or abrupt weight changes.  Respiratory: Denies difficulty breathing, shortness of breath, cough or sputum production.   Cardiovascular: Denies chest pain, chest tightness, palpitations or swelling in the hands or feet.  Gastrointestinal: Pt reports loose stool. Denies abdominal pain, bloating, constipation, diarrhea or blood in the stool.  Neurological: Pt reports difficulty with memory. Denies dizziness, difficulty with speech or problems with balance and coordination.    No other specific complaints in a complete review of systems (except as listed in HPI above).  Objective:   Physical Exam   BP 104/60 mmHg  Pulse 66  Temp(Src) 97.8 F (36.6 C) (Oral)  Wt 263 lb 8 oz (119.523 kg)  SpO2 98%   Wt Readings from Last 3 Encounters:  06/14/15 279 lb 5.2 oz (126.7 kg)  06/11/15 269 lb (122.018 kg)  06/08/15 276 lb (125.193 kg)    General: Appears his stated age, chronically ill appearing, obese in NAD. Cardiovascular: Normal rate and rhythm. S1,S2 noted.   Pulmonary/Chest: Normal effort and positive vesicular breath sounds. No respiratory distress. No wheezes, rales or ronchi noted.  Abdomen: Soft and nontender. Normal bowel sounds. Neurological: Alert and oriented.   BMET    Component Value Date/Time   NA 135  06/16/2015 0502   K 4.0 06/16/2015 0502   CL 104 06/16/2015 0502   CO2 25 06/16/2015 0502   GLUCOSE 166* 06/16/2015 0502   BUN 6 06/16/2015 0502   CREATININE 0.83 06/16/2015 0502   CREATININE 0.74 03/15/2011 0827   CALCIUM 9.2 06/16/2015 0502   GFRNONAA >60 06/16/2015 0502   GFRAA >60 06/16/2015 0502    Lipid Panel     Component Value Date/Time  CHOL 160 02/16/2015 0902   TRIG 95.0 02/16/2015 0902   HDL 47.00 02/16/2015 0902   CHOLHDL 3 02/16/2015 0902   VLDL 19.0 02/16/2015 0902   LDLCALC 94 02/16/2015 0902    CBC    Component Value Date/Time   WBC 5.9 06/16/2015 0502   RBC 3.92* 06/16/2015 0502   HGB 13.2 06/16/2015 0502   HCT 39.6 06/16/2015 0502   PLT 81* 06/16/2015 0502   MCV 101.0* 06/16/2015 0502   MCH 33.7 06/16/2015 0502   MCHC 33.3 06/16/2015 0502   RDW 14.0 06/16/2015 0502   LYMPHSABS 0.7 05/14/2015 1603   MONOABS 0.7 05/14/2015 1603   EOSABS 0.1 05/14/2015 1603   BASOSABS 0.0 05/14/2015 1603    Hgb A1C Lab Results  Component Value Date   HGBA1C 6.8* 06/14/2015        Assessment & Plan:   Hospital followup for hepatic encephalopathy:  Hospital  notes and labs reviewed He feels like he is slightly confused. Will check Ammonia level today We will continue Xifaxin and Lactulose- he will adjust Lactulose so that he is having 4-5 stools per day He will follow up with Dr. Hilarie Fredrickson as scheduled Discussed with him the importance of his wife given him an extra dose of Lactulose at any signs of confusion to avoid further hospitalizations.   RTC as needed or if symptoms persist or worsen

## 2015-06-17 NOTE — Progress Notes (Signed)
Pre visit review using our clinic review tool, if applicable. No additional management support is needed unless otherwise documented below in the visit note. 

## 2015-06-17 NOTE — Telephone Encounter (Signed)
Transition Care Management Follow-up Telephone Call   Date discharged? 06/16/15   How have you been since you were released from the hospital? Much improved, feeling better   Do you understand why you were in the hospital? yes   Do you understand the discharge instructions? yes   Where were you discharged to? Home   Items Reviewed:  Medications reviewed: yes  Allergies reviewed: yes  Dietary changes reviewed: no  Referrals reviewed: yes, GI/home health   Functional Questionnaire:   Activities of Daily Living (ADLs):   He states they are independent in the following: ambulation, bathing and hygiene, feeding, continence, grooming, toileting and dressing States they require assistance with the following: None   Any transportation issues/concerns?: no   Any patient concerns? no   Confirmed importance and date/time of follow-up visits scheduled yes, 06/17/15 @ 1300  Provider Appointment booked with [name], NP  Confirmed with patient if condition begins to worsen call PCP or go to the ER.  Patient was given the office number and encouraged to call back with question or concerns.  : yes

## 2015-06-18 ENCOUNTER — Other Ambulatory Visit: Payer: Self-pay | Admitting: *Deleted

## 2015-06-18 LAB — AMMONIA: Ammonia: 109 umol/L — ABNORMAL HIGH (ref 16–53)

## 2015-06-18 NOTE — Patient Outreach (Signed)
Transition of care call #1 for recent admission 12/4-12/6/16. Pt is doing well. States he hopes to stay out of the hospital. He has received word that he will have his [facility] Liver Transplant consultation at [address], [address] at 2:00 pm next week on Dec 13th. I have requested permission to attend this visit with pt and he has agreed.  I have reinforced the need to call me or [name] or Dr. Vena Rua office for any problems.  I will see him next Tuesday at the consultation and again on Thursday at his home.  [name] [facility] [name] [phone]

## 2015-06-19 LAB — CULTURE, BLOOD (ROUTINE X 2)
Culture: NO GROWTH
Culture: NO GROWTH

## 2015-06-22 ENCOUNTER — Telehealth: Payer: Self-pay | Admitting: Internal Medicine

## 2015-06-22 NOTE — Telephone Encounter (Signed)
I first saw this note at ~4:19 or 4:20 PM.  If there is a chance of confusion on the part of the patient, then we are obligated to call 911 on his behalf.  Please call and give what information you can.  Thanks.

## 2015-06-22 NOTE — Telephone Encounter (Signed)
[place] Call Center  Patient Name: KAHLEB MCCLANE  DOB: 1956/07/10    Initial Comment Caller states father in law his blood sugar is 319. He had 80 units of insulin. She needs to know if she should give him more?   Nurse Assessment  Nurse: Harlow Mares, RN, Suanne Marker Date/Time Eilene Ghazi Time): 06/22/2015 3:14:13 PM  Confirm and document reason for call. If symptomatic, describe symptoms. ---Caller states father in law his blood sugar is 319. He had 80 units of Novolin N insulin. She needs to know if she should give him more? Reports that patient is not drinking well, or eating well. Reports that patient's ammonia levels are elevated, patient is acting confused. Was discharged from hospital last week with high ammonia levels due to non-alcoholic cirrhoisis.  Has the patient traveled out of the country within the last 30 days? ---No  Does the patient have any new or worsening symptoms? ---Yes  Will a triage be completed? ---Yes  Related visit to physician within the last 2 weeks? ---Yes  Does the PT have any chronic conditions? (i.e. diabetes, asthma, etc.) ---Yes  List chronic conditions. ---diabetes, non alcoholic cirrhosis; CHF  Is this a behavioral health or substance abuse call? ---No     Guidelines    Guideline Title Affirmed Question Affirmed Notes  Diabetes - High Blood Sugar [1] Blood glucose > 300 mg/dl (16.5 mmol/l) AND [2] two or more times in a row    Final Disposition User   Call PCP Now Harlow Mares, RN, Rhonda    Comments  Caller reports that patient also has no other insulin available.  Spoke with nurse at [place] number, Rennis Petty, who advised that Dr. Garnette Gunner is not in the office today, but to send the note via Epic and another MD will advise on outcome from here.   Referrals  REFERRED TO PCP OFFICE   Disagree/Comply: Comply

## 2015-06-22 NOTE — Telephone Encounter (Signed)
Rhonda with [facility] said that pt's sugar was 319; pt took 80 units of Novolin N at 3 PM; now BS is 300. Pt does not have short acting insulin; pt is not drinking and more confused; pt refuses to go to ED for eval. [name] NP not in office today.Please advise.

## 2015-06-22 NOTE — Telephone Encounter (Signed)
Spoke with Marzetta Board and advised to call 911 to take pt to ED for eval. BS now is 292 but pt did not ck sugar this morning and did not take insulin at 8 am as supposed to; pt waited until 3 pm to take sugar and insulin. Marzetta Board said she can call 911 but pt will refuse to go to ED. I spoke with pt and he sounded like he understands the instructions of Dr Damita Dunnings but pt said " no one can make him go to hospital". I spoke with Marzetta Board again and she said " pt is not confused now, he is being an ass". Marzetta Board has decided she will call 911 and let EMS do eval and see if pt will go to ED. Marzetta Board said "at least she will have done all she can do."

## 2015-06-23 ENCOUNTER — Ambulatory Visit: Payer: Self-pay | Admitting: *Deleted

## 2015-06-23 NOTE — Telephone Encounter (Signed)
Noted. Thanks.  Routed to PCP as FYI.

## 2015-06-24 ENCOUNTER — Other Ambulatory Visit: Payer: Self-pay | Admitting: *Deleted

## 2015-06-24 ENCOUNTER — Ambulatory Visit: Payer: PPO | Admitting: Internal Medicine

## 2015-06-24 NOTE — Patient Outreach (Addendum)
Accompanied pt to consultation with [facility]. Pt, his wife and myself met with Jola Schmidt, NP. On Tuesday, Dec 13th.  Ms. Rodrigo Ran, reviewed pt condition: End Stage Liver Disease, Grade 3-4. She explained the pathophysiology in lay terminology. She discussed potential for liver transplant. She told pt he would have to lose 60-70 pounds before he would even be considered. She then explained that his chronic illnesses would have to be considered and these would also reduce his chances of being a candidate. She told the patient he had 3 alternatives: 1) lose the weight and see if he is a candidate, 2) Palliative care to help manage the sxs of hepatic encephalopathy, and 3) no treatment, no hospitalization, death by coma would result. Given that he has been symptomatic to the point of being obtunded on multiple occasions over the last 2 months, the last 2 alternatives are most realistic.  She discussed diet to reduce ammonia level increases: limit beef and pork to only 2 times a week and not 2 days in a row, avoid if at all possible. Eat fresh fruit and vegetables (wife states he doesn't eat vegetables except potatoes). Control glucose, no salt, drink plenty of water. Greek yogurt a good source of protein. Special K drinks.  No NSAIDS, only ECASA 81 mg daily.  She asked if he had any vomiting of blood and he reported he did several months ago. She explained the relationship between his disease process and esophageal varices.  She explained the reason for his tremor or "flapping" which is from increased ammonia levels.  She advised him NOT TO DRIVE AT ALL. He was very upset about this and started to get up and leave. He stated, "No one is going to tell me I can't drive." He said, "I can drive fine, I wouldn't drive if I didn't think I could." Dawn explained that even though he feels fine, his response time is slowed due to his advanced disease process.   She advised he can come  back in a couple of months and he said he didn't want to come back. She will send Dr. Hilarie Fredrickson her report.  IMPRESSION: Patient and wife were not prepared to hear the severity and prognosis of his disease.  PLAN SUGGESTED: Mr. And Mrs. Nardozzi need to go home and discuss how they want to proceed and what care he wants. I will visit pt at home on Thursday.  [name] [facility] [place] [phone]

## 2015-06-25 ENCOUNTER — Ambulatory Visit: Payer: Self-pay | Admitting: *Deleted

## 2015-06-25 ENCOUNTER — Encounter: Payer: Self-pay | Admitting: Cardiology

## 2015-06-25 ENCOUNTER — Other Ambulatory Visit: Payer: Self-pay | Admitting: *Deleted

## 2015-06-25 ENCOUNTER — Ambulatory Visit: Payer: PPO | Admitting: *Deleted

## 2015-06-25 ENCOUNTER — Ambulatory Visit (INDEPENDENT_AMBULATORY_CARE_PROVIDER_SITE_OTHER): Payer: PPO | Admitting: Cardiology

## 2015-06-25 VITALS — BP 120/80 | HR 60 | Ht 67.0 in | Wt 271.0 lb

## 2015-06-25 DIAGNOSIS — E785 Hyperlipidemia, unspecified: Secondary | ICD-10-CM | POA: Diagnosis not present

## 2015-06-25 DIAGNOSIS — I2510 Atherosclerotic heart disease of native coronary artery without angina pectoris: Secondary | ICD-10-CM | POA: Diagnosis not present

## 2015-06-25 DIAGNOSIS — I5032 Chronic diastolic (congestive) heart failure: Secondary | ICD-10-CM | POA: Diagnosis not present

## 2015-06-25 LAB — LIPID PANEL
CHOL/HDL RATIO: 3.1 ratio (ref <=5.0)
CHOLESTEROL: 117 mg/dL — AB (ref 125–200)
HDL: 38 mg/dL — ABNORMAL LOW (ref >=40)
LDL Cholesterol: 52 mg/dL (ref <130)
Triglycerides: 134 mg/dL (ref <150)
VLDL: 27 mg/dL (ref <30)

## 2015-06-25 NOTE — Progress Notes (Signed)
Patient ID: Adam Butler, male   DOB: 06/13/1956, 59 y.o.   MRN: [id number] PCP: Dr. Pattricia Boss, [place]   59 yo with CAD and cirrhosis from NASH returns for followup.  He entered the hospital in 12/11 with an inferior MI and had a bare metal stent to an occluded circumflex.  His hospitalization was then complicated by GI bleeding requiring transfusion and altered mental status thought to be due to hepatic encephalopathy.  He was intubated due to altered mental status to protect his airway.  He developed ventilator-associated PNA and CHF, requiring antibiotics and diuresis.  He was noted to have findings consistent with cirrhosis on imaging, but family/patient denied ETOH and viral hepatitis workup was unrevealing.  He was finally discharged home after a prolonged course.  He has had trouble getting medical care due to lack of insurance.  His AMA titer was elevated, raising the question of primary biliary cirrhosis. Given the residual RCA stenosis on catheterization in 12/11, I had him do a Lexiscan myoview in 7/12 to look for ischemia.  This showed moderate inferolateral scar consistent with prior MI and no ischemia.   He had unstable angina in 4/16.  LHC showed total occlusion of the RCA.  He was thought not to be a good candidate for CTO opening due to bleeding risk.    He was admitted in 10/16 with HCAP and septic shock from S aureus.  TEE showed normal EF but large PFO.  He was again hospitalized in 11/16 with hepatic encephalopathy.  He was started on rifaximin and lactulose. He was admitted again in 12/16 with hepatic encephalopathy.    Today, he is clear with no confusion.  No chest pain.  Weight is down 11 lbs.  He is walking more and has less dyspnea.    ECG: NSR, old inferior MI, old anterolateral MI  Labs (12/11): HBsAg negative, HAV ab negative, HCV ab negative, HBcAb negative, HVC RNA negative, ceruloplasmin level normal, ANA negative, anti-mitochondrial ab level elevated.  Labs (1/12):  K 3.9, creatinine 0.75, HCT 28.1, AST 40, ALT 29 Labs (2/12): K 4.6, creatinine 0.9, ALT 23, AST 33 Labs (7/12): K 4.1, creatinine 0.8, BNP 28, AST/ALT normal, LDL 89, HDL 62 Labs (9/12): K 4.6, creatinine 0.74 Labs (4/16): AST 66, ALT 47 Labs (6/16): K 4.1, creatinine 0.72, BNP 38, HCT 38.8 Labs (12/16): K 4, creatinine 0.83  Allergies (verified):  No Known Drug Allergies  Past Medical History: 1. CAD: Inferior MI 12/11.  LHC with occluded mid CFX and 80% proximal RCA.  EF 55%.  He had 3.0 x 28 VIsion BMS to CFX.  Lexiscan myoview (7/12): EF 54%, moderate inferolateral scar with no ischemia. Unstable angina 4/16 with chronic total occlusion of the RCA, EF 45-50% by LV-gram.  Chronic angina, does not tolerate Imdur.  2. Diastolic CHF: Echo (40/98) with EF 50-55% and mild LVH.  EF 55% by LV-gram in 12/11. EF 45-50% by LV-gram in 4/16.  Echo (5/16) with EF 55-60%.  Echo (10/16) with EF 55-60%, mild biatrial enlargement.  3. Hypertension 4. Type II diabetes 5. Cirrhosis: Suspect NASH.  Family and patient deny ETOH.  HCV, HBV, and HAV workup negative.  ANA negative.  AMA positive.  He had grade II esophageal varices on EGD with no evidence for bleeding in 12/11. Small varices seen again on EGD in 2015.  He had hepatic encephalopathy and ascites in 12/11.  Admitted for HE in 11/16 and again in 12/16.  He is now on rifaximin  and lactulose.  6. SVT (1/12): appeared to be an ectopic atrial tachycardia.  Required DCCV with hemodynamic instability.  7. GI bleed (12/11): Etiology not clearly defined.  EGD showed nonbleeding esophageal varices.  8. Obesity 9. PFO: TEE in 10/16 in the setting of S aureus bacteremia showed large PFO.   Family History: Father:Deceased age 12. CABG age 52. Mother:Living. Siblings: 3 brothers & 1 sister living. 1 brother MI age 35 1 brother MI age 72  Social History: Unemployed, worked in Theatre manager prior.  Married  Tobacco Use - Former. Quit 2016.  Alcohol Use -  no Regular Exercise - yes--walk  ROS: All systems reviewed and negative except as per HPI.    Current Outpatient Prescriptions  Medication Sig Dispense Refill  . aspirin 81 MG tablet Take 81 mg by mouth daily.     Marland Kitchen atorvastatin (LIPITOR) 10 MG tablet Take 10 mg by mouth daily.     . furosemide (LASIX) 40 MG tablet Take 40 mg by mouth daily.     . insulin NPH Human (HUMULIN N,NOVOLIN N) 100 UNIT/ML injection Inject 0.35-0.8 mLs (35-80 Units total) into the skin See admin instructions. Take 80 units in the morning and 35 units in the evening. 10 mL 0  . lactulose (CHRONULAC) 10 GM/15ML solution Take 45 mLs (30 g total) by mouth every 6 (six) hours. May take an additional dose as needed for bowel movement 1350 mL 2  . metFORMIN (GLUCOPHAGE) 1000 MG tablet Take 1,000 mg by mouth 2 (two) times daily.  11  . Multiple Vitamin (MULTIVITAMIN) capsule Take 1 capsule by mouth daily.      . nadolol (CORGARD) 40 MG tablet Take 1 tablet (40 mg total) by mouth daily. 30 tablet 5  . nitroGLYCERIN (NITROSTAT) 0.4 MG SL tablet Place 0.4 mg under the tongue every 5 (five) minutes as needed for chest pain (chest pain).    Marland Kitchen omeprazole (PRILOSEC) 20 MG capsule Take 1 capsule (20 mg total) by mouth daily. 30 capsule 6  . ranolazine (RANEXA) 500 MG 12 hr tablet Take 1 tablet (500 mg total) by mouth 2 (two) times daily.    . rifaximin (XIFAXAN) 550 MG TABS tablet Take 1 tablet (550 mg total) by mouth 2 (two) times daily. 24 tablet 0  . spironolactone (ALDACTONE) 25 MG tablet TAKE 1 TABLET EVERY DAY 30 tablet 11   No current facility-administered medications for this visit.    BP 120/80 mmHg  Pulse 60  Ht 5' 7"  (1.702 m)  Wt 271 lb (122.925 kg)  BMI 42.43 kg/m2 General:  Well developed, well nourished, in no acute distress.  Obese.  Neck:  Neck thick, no JVD. No masses, thyromegaly or abnormal cervical nodes. Lungs:  Distant breath sounds bilaterally.  Heart:  Non-displaced PMI, chest non-tender; regular  rate and rhythm, S1, S2 without murmurs, rubs or gallops. Carotid upstroke normal, no bruit.  Pedals normal pulses. Trace ankle edema. Abdomen:  Bowel sounds positive; abdomen soft and non-tender without masses, organomegaly, or hernias noted. No hepatosplenomegaly. Extremities:  No clubbing or cyanosis. Neurologic:  Alert and oriented x 3. Psych:  Normal affect.  Assessment/Plan: 1. CAD: Known CTO of RCA.  He would not be a good candidate for CTO opening given higher bleeding risk in setting of cirrhosis with portal hypertension.  He has not had any recent angina.   - Continue ASA 81, statin, beta blocker, and ranolazine 500 mg bid.  2. Cirrhosis: Suspect NAFLD.  Followed by GI.  Recent  admissions with hepatic encephalopathy.  Now on rifaximin and lactulose.   3. Hyperlipidemia: Continue atorvastatin, check lipids today.   4. Chronic diastolic CHF: EF 32-44% on 10/16 echo.  He appears euvolemic.  Continue Lasix 40 mg daily.   Followup in 6 months.   Loralie Champagne 06/25/2015

## 2015-06-25 NOTE — Patient Instructions (Signed)
Medication Instructions: No changes today  Labwork: Your physician recommends that you have a lipid profile today.   Testing/Procedures: None   Follow-Up: Your physician wants you to follow-up in: 6 months with Dr Aundra Dubin. (June 2017).  You will receive a reminder letter in the mail two months in advance. If you don't receive a letter, please call our office to schedule the follow-up appointment.        If you need a refill on your cardiac medications before your next appointment, please call your pharmacy.

## 2015-06-25 NOTE — Patient Outreach (Signed)
S:  Pt is still reeling from the Liver Specialist consultation. Pt, wife and daughter-in-law present.  Pt clearly does not like the consultant. He said she, lies. He says he is not going to die. He says he has a new commitment to following his regimen.   We tried to establish a new plan of care: Pt to follow his recommended regimen.                                                                   Refer to Palliative care, pt is refusing Hospice.                                                                    Immediate goal to live through Christmas.  O:  BP 110/52 mmHg  Pulse 58  Resp 18  Wt 264 lb (119.75 kg)  SpO2 98%       RRR      Lungs clear      No edema.  A:  End Stage Liver Disease  P:  Call Dr. Hilarie Fredrickson to request Palliative Care/Hospice referral.       Discussed specific items he could eat.       I will call call pt next week.  [name] [facility] [phone]        I

## 2015-06-27 ENCOUNTER — Inpatient Hospital Stay (HOSPITAL_COMMUNITY): Payer: PPO

## 2015-06-27 ENCOUNTER — Emergency Department (HOSPITAL_COMMUNITY): Payer: PPO

## 2015-06-27 ENCOUNTER — Inpatient Hospital Stay (HOSPITAL_COMMUNITY)
Admission: EM | Admit: 2015-06-27 | Discharge: 2015-06-30 | DRG: 442 | Disposition: A | Discharge status: Home / Self Care | Payer: PPO | Attending: Internal Medicine | Admitting: Internal Medicine

## 2015-06-27 ENCOUNTER — Encounter (HOSPITAL_COMMUNITY): Payer: Self-pay | Admitting: Emergency Medicine

## 2015-06-27 DIAGNOSIS — E1165 Type 2 diabetes mellitus with hyperglycemia: Secondary | ICD-10-CM | POA: Diagnosis present

## 2015-06-27 DIAGNOSIS — K7290 Hepatic failure, unspecified without coma: Secondary | ICD-10-CM | POA: Diagnosis present

## 2015-06-27 DIAGNOSIS — R410 Disorientation, unspecified: Secondary | ICD-10-CM | POA: Diagnosis not present

## 2015-06-27 DIAGNOSIS — Q2112 Patent foramen ovale: Secondary | ICD-10-CM

## 2015-06-27 DIAGNOSIS — K7581 Nonalcoholic steatohepatitis (NASH): Secondary | ICD-10-CM | POA: Diagnosis present

## 2015-06-27 DIAGNOSIS — E1122 Type 2 diabetes mellitus with diabetic chronic kidney disease: Secondary | ICD-10-CM | POA: Diagnosis present

## 2015-06-27 DIAGNOSIS — E876 Hypokalemia: Secondary | ICD-10-CM | POA: Diagnosis present

## 2015-06-27 DIAGNOSIS — K7460 Unspecified cirrhosis of liver: Secondary | ICD-10-CM | POA: Diagnosis present

## 2015-06-27 DIAGNOSIS — Z7982 Long term (current) use of aspirin: Secondary | ICD-10-CM | POA: Diagnosis not present

## 2015-06-27 DIAGNOSIS — I130 Hypertensive heart and chronic kidney disease with heart failure and stage 1 through stage 4 chronic kidney disease, or unspecified chronic kidney disease: Secondary | ICD-10-CM | POA: Diagnosis present

## 2015-06-27 DIAGNOSIS — I2510 Atherosclerotic heart disease of native coronary artery without angina pectoris: Secondary | ICD-10-CM | POA: Diagnosis present

## 2015-06-27 DIAGNOSIS — N182 Chronic kidney disease, stage 2 (mild): Secondary | ICD-10-CM | POA: Diagnosis present

## 2015-06-27 DIAGNOSIS — K7682 Hepatic encephalopathy: Secondary | ICD-10-CM | POA: Diagnosis present

## 2015-06-27 DIAGNOSIS — I8500 Esophageal varices without bleeding: Secondary | ICD-10-CM | POA: Diagnosis present

## 2015-06-27 DIAGNOSIS — Z955 Presence of coronary angioplasty implant and graft: Secondary | ICD-10-CM | POA: Diagnosis not present

## 2015-06-27 DIAGNOSIS — E1121 Type 2 diabetes mellitus with diabetic nephropathy: Secondary | ICD-10-CM | POA: Diagnosis present

## 2015-06-27 DIAGNOSIS — R4182 Altered mental status, unspecified: Secondary | ICD-10-CM

## 2015-06-27 DIAGNOSIS — D696 Thrombocytopenia, unspecified: Secondary | ICD-10-CM | POA: Diagnosis present

## 2015-06-27 DIAGNOSIS — I5032 Chronic diastolic (congestive) heart failure: Secondary | ICD-10-CM | POA: Diagnosis present

## 2015-06-27 DIAGNOSIS — Z4659 Encounter for fitting and adjustment of other gastrointestinal appliance and device: Secondary | ICD-10-CM

## 2015-06-27 DIAGNOSIS — Z794 Long term (current) use of insulin: Secondary | ICD-10-CM

## 2015-06-27 DIAGNOSIS — E785 Hyperlipidemia, unspecified: Secondary | ICD-10-CM | POA: Diagnosis present

## 2015-06-27 DIAGNOSIS — I252 Old myocardial infarction: Secondary | ICD-10-CM

## 2015-06-27 DIAGNOSIS — F17210 Nicotine dependence, cigarettes, uncomplicated: Secondary | ICD-10-CM | POA: Diagnosis present

## 2015-06-27 DIAGNOSIS — Q211 Atrial septal defect: Secondary | ICD-10-CM | POA: Diagnosis not present

## 2015-06-27 DIAGNOSIS — K219 Gastro-esophageal reflux disease without esophagitis: Secondary | ICD-10-CM | POA: Diagnosis present

## 2015-06-27 DIAGNOSIS — IMO0002 Reserved for concepts with insufficient information to code with codable children: Secondary | ICD-10-CM | POA: Diagnosis present

## 2015-06-27 DIAGNOSIS — Z720 Tobacco use: Secondary | ICD-10-CM | POA: Diagnosis present

## 2015-06-27 LAB — COMPREHENSIVE METABOLIC PANEL
ALBUMIN: 2.6 g/dL — AB (ref 3.5–5.0)
ALT: 41 U/L (ref 17–63)
ALT: 45 U/L (ref 17–63)
AST: 68 U/L — AB (ref 15–41)
AST: 84 U/L — AB (ref 15–41)
Albumin: 4 g/dL (ref 3.5–5.0)
Alkaline Phosphatase: 61 U/L (ref 38–126)
Alkaline Phosphatase: 164 U/L — ABNORMAL HIGH (ref 38–126)
Anion gap: 10 (ref 5–15)
Anion gap: 9 (ref 5–15)
BUN: 8 mg/dL (ref 6–20)
CHLORIDE: 102 mmol/L (ref 101–111)
CHLORIDE: 106 mmol/L (ref 101–111)
CO2: 24 mmol/L (ref 22–32)
CO2: 26 mmol/L (ref 22–32)
CREATININE: 0.67 mg/dL (ref 0.61–1.24)
Calcium: 8.5 mg/dL — ABNORMAL LOW (ref 8.9–10.3)
Calcium: 8.9 mg/dL (ref 8.9–10.3)
Creatinine, Ser: 0.8 mg/dL (ref 0.61–1.24)
GFR calc Af Amer: >60 mL/min (ref >60)
GFR calc non Af Amer: >60 mL/min (ref >60)
Glucose, Bld: 114 mg/dL — ABNORMAL HIGH (ref 65–99)
Glucose, Bld: 131 mg/dL — ABNORMAL HIGH (ref 65–99)
POTASSIUM: 3.4 mmol/L — AB (ref 3.5–5.1)
POTASSIUM: 3.9 mmol/L (ref 3.5–5.1)
SODIUM: 138 mmol/L (ref 135–145)
Sodium: 139 mmol/L (ref 135–145)
Total Bilirubin: 1.3 mg/dL — ABNORMAL HIGH (ref 0.3–1.2)
Total Bilirubin: 1.1 mg/dL (ref 0.3–1.2)
Total Protein: 7.2 g/dL (ref 6.5–8.1)
Total Protein: 6.4 g/dL — ABNORMAL LOW (ref 6.5–8.1)

## 2015-06-27 LAB — CBC
HEMATOCRIT: 33.7 % — AB (ref 39.0–52.0)
HEMATOCRIT: 39.1 % (ref 39.0–52.0)
Hemoglobin: 13.2 g/dL (ref 13.0–17.0)
Hemoglobin: 11.5 g/dL — ABNORMAL LOW (ref 13.0–17.0)
MCH: 33.6 pg (ref 26.0–34.0)
MCH: 34.1 pg — ABNORMAL HIGH (ref 26.0–34.0)
MCHC: 33.8 g/dL (ref 30.0–36.0)
MCHC: 34.1 g/dL (ref 30.0–36.0)
MCV: 100 fL (ref 78.0–100.0)
MCV: 99.5 fL (ref 78.0–100.0)
Platelets: 96 10*3/uL — ABNORMAL LOW (ref 150–400)
Platelets: 53 10*3/uL — ABNORMAL LOW (ref 150–400)
RBC: 3.37 MIL/uL — ABNORMAL LOW (ref 4.22–5.81)
RBC: 3.93 MIL/uL — AB (ref 4.22–5.81)
RDW: 13.4 % (ref 11.5–15.5)
RDW: 14.2 % (ref 11.5–15.5)
WBC: 7.1 10*3/uL (ref 4.0–10.5)
WBC: 3.1 10*3/uL — AB (ref 4.0–10.5)

## 2015-06-27 LAB — URINALYSIS, ROUTINE W REFLEX MICROSCOPIC
BILIRUBIN URINE: NEGATIVE
GLUCOSE, UA: NEGATIVE mg/dL
HGB URINE DIPSTICK: NEGATIVE
KETONES UR: NEGATIVE mg/dL
Leukocytes, UA: NEGATIVE
Nitrite: NEGATIVE
PH: 6.5 (ref 5.0–8.0)
PROTEIN: NEGATIVE mg/dL
Specific Gravity, Urine: 1.023 (ref 1.005–1.030)

## 2015-06-27 LAB — GLUCOSE, CAPILLARY
GLUCOSE-CAPILLARY: 127 mg/dL — AB (ref 65–99)
GLUCOSE-CAPILLARY: 121 mg/dL — AB (ref 65–99)
GLUCOSE-CAPILLARY: 123 mg/dL — AB (ref 65–99)
Glucose-Capillary: 117 mg/dL — ABNORMAL HIGH (ref 65–99)

## 2015-06-27 LAB — PROTIME-INR
INR: 1.18 (ref 0.00–1.49)
PROTHROMBIN TIME: 15.2 s (ref 11.6–15.2)

## 2015-06-27 LAB — AMMONIA: AMMONIA: 297 umol/L — AB (ref 9–35)

## 2015-06-27 LAB — APTT: APTT: 34 s (ref 24–37)

## 2015-06-27 LAB — SALICYLATE LEVEL

## 2015-06-27 LAB — CBG MONITORING, ED: GLUCOSE-CAPILLARY: 115 mg/dL — AB (ref 65–99)

## 2015-06-27 LAB — BRAIN NATRIURETIC PEPTIDE: B NATRIURETIC PEPTIDE 5: 35.3 pg/mL (ref 0.0–100.0)

## 2015-06-27 LAB — ACETAMINOPHEN LEVEL

## 2015-06-27 MED ORDER — POTASSIUM CHLORIDE 10 MEQ/100ML IV SOLN
10.0000 meq | INTRAVENOUS | Status: AC
  Administered 2015-06-27: 10 meq via INTRAVENOUS
  Filled 2015-06-27: qty 100

## 2015-06-27 MED ORDER — HYDRALAZINE HCL 20 MG/ML IJ SOLN: 5.0000 mg | INTRAMUSCULAR | Status: DC | PRN

## 2015-06-27 MED ORDER — LACTULOSE ENEMA
300.0000 mL | Freq: Four times a day (QID) | ORAL | Status: DC
  Administered 2015-06-27: 300 mL via RECTAL
  Filled 2015-06-27 (×5): qty 300

## 2015-06-27 MED ORDER — PANTOPRAZOLE SODIUM 40 MG IV SOLR
40.0000 mg | INTRAVENOUS | Status: DC
  Administered 2015-06-27 – 2015-06-28 (×2): 40 mg via INTRAVENOUS
  Filled 2015-06-27 (×2): qty 40

## 2015-06-27 MED ORDER — LACTULOSE 10 GM/15ML PO SOLN
30.0000 g | Freq: Once | ORAL | Status: DC
  Filled 2015-06-27: qty 45

## 2015-06-27 MED ORDER — LACTULOSE 10 GM/15ML PO SOLN
45.0000 g | Freq: Three times a day (TID) | ORAL | Status: DC
  Administered 2015-06-27 – 2015-06-29 (×8): 45 g via ORAL
  Filled 2015-06-27 (×12): qty 90

## 2015-06-27 MED ORDER — METOPROLOL TARTRATE 1 MG/ML IV SOLN
2.5000 mg | Freq: Three times a day (TID) | INTRAVENOUS | Status: DC
  Filled 2015-06-27: qty 5

## 2015-06-27 MED ORDER — INSULIN ASPART 100 UNIT/ML ~~LOC~~ SOLN
0.0000 [IU] | Freq: Three times a day (TID) | SUBCUTANEOUS | Status: DC
  Administered 2015-06-27 (×2): 1 [IU] via SUBCUTANEOUS
  Administered 2015-06-28: 2 [IU] via SUBCUTANEOUS
  Administered 2015-06-28: 1 [IU] via SUBCUTANEOUS
  Administered 2015-06-28: 3 [IU] via SUBCUTANEOUS
  Administered 2015-06-29: 2 [IU] via SUBCUTANEOUS
  Administered 2015-06-29 (×2): 1 [IU] via SUBCUTANEOUS

## 2015-06-27 MED ORDER — SODIUM CHLORIDE 0.9 % IJ SOLN
3.0000 mL | Freq: Two times a day (BID) | INTRAMUSCULAR | Status: DC
  Administered 2015-06-27 – 2015-06-30 (×7): 3 mL via INTRAVENOUS

## 2015-06-27 MED ORDER — HYDROXYZINE HCL 50 MG/ML IM SOLN
25.0000 mg | Freq: Four times a day (QID) | INTRAMUSCULAR | Status: DC | PRN
  Filled 2015-06-27: qty 0.5

## 2015-06-27 MED ORDER — SODIUM CHLORIDE 0.9 % IV SOLN
INTRAVENOUS | Status: DC
  Administered 2015-06-27: 04:00:00 via INTRAVENOUS
  Administered 2015-06-27: 75 mL via INTRAVENOUS
  Administered 2015-06-28: 22:00:00 via INTRAVENOUS

## 2015-06-27 MED ORDER — NICOTINE 21 MG/24HR TD PT24
21.0000 mg | MEDICATED_PATCH | Freq: Every day | TRANSDERMAL | Status: DC
  Administered 2015-06-27: 21 mg via TRANSDERMAL
  Filled 2015-06-27 (×4): qty 1

## 2015-06-27 MED ORDER — ASPIRIN 300 MG RE SUPP
300.0000 mg | Freq: Every day | RECTAL | Status: DC
  Administered 2015-06-27: 300 mg via RECTAL
  Filled 2015-06-27 (×2): qty 1

## 2015-06-27 NOTE — ED Provider Notes (Signed)
CSN: [id number]     Arrival date & time 06/27/15  0014 History  By signing my name below, I, Adam Butler, attest that this documentation has been prepared under the direction and in the presence of Everlene Balls, MD . Electronically Signed: [name], Scribe. 06/27/2015. 1:54 AM.    Chief Complaint  Patient presents with  . Altered Mental Status   LEVEL 5 CAVEAT DUE TO MENTAL STATUS  The history is provided by the EMS personnel. No language interpreter was used.     HPI Comments:  Adam Butler is a 59 y.o. male with a history of esophageal varices,cirrhosis, DM, COPD, and HTN, who presents to the Emergency Department via EMS for altered mental status. Per EMS pt was last seen normal ~2200 on 06/26/15 by wife. EMS states pt is currently non-verbal but withdraws from pain. Pt's wife reported to EMS that pt has h/o same when ammonia level is elevated . He was given extra lactulose by wife PTA.  Past Medical History  Diagnosis Date  . Cirrhosis ([place]) 2011    Cryptogenic, Likely NASH. Family/pt deny EtOH. HCV, HBV, HAV negative. ANA negative. AMA positive. Ascites 12/11  . Hyperlipemia   . Coronary artery disease     Inferior MI 12/11; LHC with occluded mid CFX and 80% proximal RCA. EF 55%. He had 3.0 x 28 vision BMS to CFX  . Diastolic CHF, acute ([place])     Echo 12/11 with ef 50-55% and mild LVH. EF 55% by LV0gram in 12/11  . Hypertension   . Type II diabetes mellitus ([place]) 2011  . SVT (supraventricular tachycardia) ([place])     1/12: appeared to be an ectopic atrial tachycardia. Required DCCV with hemodynamic instability  . GI bleed     12/11: Etiology not clearly defined. EGD: nonbleeding esophageal varices.  . S/P coronary artery stent placement 06/2010  . Esophageal varices ([place]) 2011, 2013    no hx acute variceal bleed  . Hepatic encephalopathy ([place]) 2011, 12/2013  . Myocardial infarction [facility]) ? 2012  . Shortness of breath dyspnea   . Arthritis   . Hx of echocardiogram      Echo 5/16:  EF 55-60%, no RWMA, mod LAE  . Thrombocytopenia ([place])   . Cholelithiasis   . Rectal varices   . PFO (patent foramen ovale): Per TEE 04/20/2015 04/20/2015  . Bacteremia   . Portal hypertension ([place])   . Rectal varices    Past Surgical History  Procedure Laterality Date  . Cardiac catheterization  07/01/2010    BMS to CFX.  Marland Kitchen Appendectomy    . Orif r leg    . Esophagogastroduodenoscopy  04/13/2012    Procedure: ESOPHAGOGASTRODUODENOSCOPY (EGD);  Surgeon: Inda Castle, MD;  Location: [facility];  Service: Endoscopy;  Laterality: N/A;  . Colonoscopy  04/13/2012    Procedure: COLONOSCOPY;  Surgeon: Inda Castle, MD;  Location: [facility] ENDOSCOPY;  Service: Endoscopy;  Laterality: N/A;  . Esophagogastroduodenoscopy N/A 01/01/2014    Procedure: ESOPHAGOGASTRODUODENOSCOPY (EGD);  Surgeon: Jerene Bears, MD;  Location[facility] ENDOSCOPY;  Service: Endoscopy;  Laterality: N/A;  . Esophageal banding N/A 01/01/2014    Procedure: ESOPHAGEAL BANDING;  Surgeon: Jerene Bears, MD;  Location: [facility] ENDOSCOPY;  Service: Endoscopy;  Laterality: N/A;  . Coronary stent placement  06/30/2010    CFX   Distal        . Left heart catheterization with coronary angiogram N/A 10/15/2014    Procedure: LEFT HEART CATHETERIZATION WITH CORONARY ANGIOGRAM;  Surgeon: Peter M Martinique, MD;  Location: [facility] CATH LAB;  Service: Cardiovascular;  Laterality: N/A;  . Tee without cardioversion N/A 04/20/2015    Procedure: TRANSESOPHAGEAL ECHOCARDIOGRAM (TEE);  Surgeon: Fay Records, MD;  Location: [facility] ENDOSCOPY;  Service: Cardiovascular;  Laterality: N/A;   Family History  Problem Relation Age of Onset  . Heart attack Brother 24    MI  . Diabetes Brother   . Heart attack Father   . Diabetes Father   . COPD Father   . COPD Mother   . Stroke Neg Hx   . Heart attack Brother    Social History  Substance Use Topics  . Smoking status: Current Every Day Smoker -- 1.00 packs/day for 36 years    Types: E-cigarettes  .  Smokeless tobacco: Never Used     Comment: uses vapor cigarettes (2016 )  . Alcohol Use: No    Review of Systems  Unable to perform ROS: Mental status change    Allergies  Isosorbide and Lactose intolerance (gi)  Home Medications   Prior to Admission medications   Medication Sig Start Date End Date Taking? Authorizing Provider  aspirin 81 MG tablet Take 81 mg by mouth daily.     Historical Provider, MD  atorvastatin (LIPITOR) 10 MG tablet Take 10 mg by mouth daily.     Historical Provider, MD  furosemide (LASIX) 40 MG tablet Take 40 mg by mouth daily.  01/09/12   Larey Dresser, MD  insulin NPH Human (HUMULIN N,NOVOLIN N) 100 UNIT/ML injection Inject 0.35-0.8 mLs (35-80 Units total) into the skin See admin instructions. Take 80 units in the morning and 35 units in the evening. 04/20/15   Eugenie Filler, MD  lactulose (CHRONULAC) 10 GM/15ML solution Take 45 mLs (30 g total) by mouth every 6 (six) hours. May take an additional dose as needed for bowel movement 06/16/15   Thurnell Lose, MD  metFORMIN (GLUCOPHAGE) 1000 MG tablet Take 1,000 mg by mouth 2 (two) times daily. 11/27/14   Historical Provider, MD  Multiple Vitamin (MULTIVITAMIN) capsule Take 1 capsule by mouth daily.      Historical Provider, MD  nadolol (CORGARD) 40 MG tablet Take 1 tablet (40 mg total) by mouth daily. 01/01/15   Jerene Bears, MD  nitroGLYCERIN (NITROSTAT) 0.4 MG SL tablet Place 0.4 mg under the tongue every 5 (five) minutes as needed for chest pain (chest pain).    Historical Provider, MD  omeprazole (PRILOSEC) 20 MG capsule Take 1 capsule (20 mg total) by mouth daily. 01/15/15   Larey Dresser, MD  ranolazine (RANEXA) 500 MG 12 hr tablet Take 1 tablet (500 mg total) by mouth 2 (two) times daily. 06/10/15   Larey Dresser, MD  rifaximin (XIFAXAN) 550 MG TABS tablet Take 1 tablet (550 mg total) by mouth 2 (two) times daily. 02/13/15   Jerene Bears, MD  spironolactone (ALDACTONE) 25 MG tablet TAKE 1 TABLET EVERY DAY  05/13/15   Larey Dresser, MD   BP 114/52 mmHg  Pulse 58  Temp(Src) 98.1 F (36.7 C) (Oral)  Resp 15  SpO2 95% Physical Exam  Constitutional: Vital signs are normal. He appears well-developed and well-nourished.  Non-toxic appearance. He does not appear ill. No distress.  HENT:  Head: Normocephalic and atraumatic.  Nose: Nose normal.  Mouth/Throat: Oropharynx is clear and moist. No oropharyngeal exudate.  Eyes: Conjunctivae and EOM are normal. Pupils are equal, round, and reactive to light. No scleral icterus.  Neck: Normal range of motion. Neck supple. No tracheal deviation, no edema, no erythema and normal range of motion present. No thyroid mass and no thyromegaly present.  Cardiovascular: Normal rate, regular rhythm, S1 normal, S2 normal, normal heart sounds, intact distal pulses and normal pulses.  Exam reveals no gallop and no friction rub.   No murmur heard. Pulmonary/Chest: Effort normal and breath sounds normal. No respiratory distress. He has no wheezes. He has no rhonchi. He has no rales.  Abdominal: Soft. Normal appearance and bowel sounds are normal. He exhibits no distension, no ascites and no mass. There is no hepatosplenomegaly. There is no tenderness. There is no rebound, no guarding and no CVA tenderness.  Musculoskeletal: Normal range of motion. He exhibits edema. He exhibits no tenderness.  Lymphadenopathy:    He has no cervical adenopathy.  Neurological: He has normal strength. No cranial nerve deficit or sensory deficit.  arousable to physical stimuli only   Skin: Skin is warm, dry and intact. No petechiae and no rash noted. He is not diaphoretic. No erythema. No pallor.  Psychiatric: He has a normal mood and affect. His behavior is normal. Judgment normal.  Nursing note and vitals reviewed.   ED Course  Procedures   DIAGNOSTIC STUDIES:  Oxygen Saturation is 99% on RA, normal by my interpretation.      Labs Review Labs Reviewed  COMPREHENSIVE METABOLIC  PANEL - Abnormal; Notable for the following:    Potassium 3.4 (*)    Glucose, Bld 114 (*)    BUN <5 (*)    Calcium 8.5 (*)    AST 84 (*)    Total Bilirubin 1.3 (*)    All other components within normal limits  CBC - Abnormal; Notable for the following:    RBC 3.93 (*)    Platelets 96 (*)    All other components within normal limits  AMMONIA - Abnormal; Notable for the following:    Ammonia 297 (*)    All other components within normal limits  ACETAMINOPHEN LEVEL - Abnormal; Notable for the following:    Acetaminophen (Tylenol), Serum <10 (*)    All other components within normal limits  URINALYSIS, ROUTINE W REFLEX MICROSCOPIC (NOT AT [facility]) - Abnormal; Notable for the following:    Color, Urine AMBER (*)    All other components within normal limits  CBG MONITORING, ED - Abnormal; Notable for the following:    Glucose-Capillary 115 (*)    All other components within normal limits  CULTURE, BLOOD (ROUTINE X 2)  CULTURE, BLOOD (ROUTINE X 2)  URINE CULTURE  PROTIME-INR  SALICYLATE LEVEL    Imaging Review Ct Head Wo Contrast  06/27/2015  CLINICAL DATA:  Altered mental status. Patient is currently nonverbal but withdrawal is from pain. History of esophageal varices, cirrhosis, diabetes, COPD, hypertension. EXAM: CT HEAD WITHOUT CONTRAST TECHNIQUE: Contiguous axial images were obtained from the base of the skull through the vertex without intravenous contrast. COMPARISON:  06/14/2015 FINDINGS: Mild cerebral atrophy. No ventricular dilatation. Patchy low-attenuation changes in the deep white matter are nonspecific but likely represent small vessel ischemic change. Other white matter disease not excluded. No significant change since previous study. No mass effect or midline shift. No abnormal extra-axial fluid collections. Gray-white matter junctions are distinct. Basal cisterns are not effaced. No evidence of acute intracranial hemorrhage. No depressed skull fractures. Visualized paranasal  sinuses and mastoid air cells are not opacified. IMPRESSION: No acute intracranial abnormalities. Mild chronic atrophy and white matter disease. Electronically Signed  By: Lucienne Capers M.D.   On: 06/27/2015 01:26   I have personally reviewed and evaluated these images and lab results as part of my medical decision-making.   EKG Interpretation   Date/Time:  Saturday June 27 2015 00:43:20 EST Ventricular Rate:  56 PR Interval:  153 QRS Duration: 104 QT Interval:  524 QTC Calculation: 506 R Axis:   -15 Text Interpretation:  Sinus rhythm Inferolateral infarct, old Prolonged QT  interval No significant change since last tracing Confirmed by Glynn Octave [phone]) on 06/27/2015 1:09:31 AM      MDM   Final diagnoses:  None   Patient presents to the ED for AMS with h/o cirrhosis and elevated ammonia.  Today his level is 297.  His neurological exam shows decreased mentation.  He will require admission.  He was ordered more lactulose.  AMS work up was otherwise negative.  Patient admitted to hospitalist for further care.   I personally performed the services described in this documentation, which was scribed in my presence. The recorded information has been reviewed and is accurate.      Everlene Balls, MD 06/27/15 [phone]

## 2015-06-27 NOTE — H&P (Addendum)
[facility] History and Physical  Adam Butler GGY:[id number] DOB: 10-29-1955 DOA: 06/27/2015  Referring physician: ED physician PCP: [name], NP  Specialists:   Chief Complaint: Altered mental status  HPI: Adam Butler is a 59 y.o. male with PMH of liver cirrhosis due to NASH, CAD, S/P stent placement, GI bleeding, esophageal varices, rectal varices, hepatic encephalopathy, thrombocytopenia, PFO, hypertension, hyperlipidemia, diabetes mellitus, GERD, who presents with altered mental status.  Per patient's wife, patient has been in his normal baseline health until tonight when he become confused, which has been progressively getting worse and finally he becomes unresponsive. He did not complain abdominal pain, fever, chills, chest pain, shortness of breath, cough, unilateral weakness to his wife before he became confused. Wife states that the patient has been compliant to his home medications including lactulose 30 g 3 times a day. He usually has good bowel movements, but not today.   In ED, patient was found to have ammonia 297, WBC 7.1, temperature normal, slightly bradycardia, negative urinalysis, potassium 3.4, renal function okay, negative CT head for acute abnormalities. Pending chest x-ray. Patient is admitted to inpatient for further evaluation treatment.  Where does patient live?   At home   Can patient participate in ADLs?  Some   Review of Systems: Could not be reviewed to due to altered mental status  Allergy:  Allergies  Allergen Reactions  . Isosorbide Other (See Comments)    Nose bleeds  . Lactose Intolerance (Gi) Other (See Comments)    Past Medical History  Diagnosis Date  . Cirrhosis ([place]) 2011    Cryptogenic, Likely NASH. Family/pt deny EtOH. HCV, HBV, HAV negative. ANA negative. AMA positive. Ascites 12/11  . Hyperlipemia   . Coronary artery disease     Inferior MI 12/11; LHC with occluded mid CFX and 80% proximal RCA. EF 55%. He had 3.0 x  28 vision BMS to CFX  . Diastolic CHF, acute ([place])     Echo 12/11 with ef 50-55% and mild LVH. EF 55% by LV0gram in 12/11  . Hypertension   . Type II diabetes mellitus ([place]) 2011  . SVT (supraventricular tachycardia) ([place])     1/12: appeared to be an ectopic atrial tachycardia. Required DCCV with hemodynamic instability  . GI bleed     12/11: Etiology not clearly defined. EGD: nonbleeding esophageal varices.  . S/P coronary artery stent placement 06/2010  . Esophageal varices ([place]) 2011, 2013    no hx acute variceal bleed  . Hepatic encephalopathy ([place]) 2011, 12/2013  . Myocardial infarction [facility]) ? 2012  . Shortness of breath dyspnea   . Arthritis   . Hx of echocardiogram     Echo 5/16:  EF 55-60%, no RWMA, mod LAE  . Thrombocytopenia ([place])   . Cholelithiasis   . Rectal varices   . PFO (patent foramen ovale): Per TEE 04/20/2015 04/20/2015  . Bacteremia   . Portal hypertension ([place])   . Rectal varices     Past Surgical History  Procedure Laterality Date  . Cardiac catheterization  07/01/2010    BMS to CFX.  Marland Kitchen Appendectomy    . Orif r leg    . Esophagogastroduodenoscopy  04/13/2012    Procedure: ESOPHAGOGASTRODUODENOSCOPY (EGD);  Surgeon: Inda Castle, MD;  Location: [facility] ENDOSCOPY;  Service: Endoscopy;  Laterality: N/A;  . Colonoscopy  04/13/2012    Procedure: COLONOSCOPY;  Surgeon: Inda Castle, MD;  Location: [facility] ENDOSCOPY;  Service: Endoscopy;  Laterality: N/A;  . Esophagogastroduodenoscopy N/A 01/01/2014  Procedure: ESOPHAGOGASTRODUODENOSCOPY (EGD);  Surgeon: Jerene Bears, MD;  Location: [facility] ENDOSCOPY;  Service: Endoscopy;  Laterality: N/A;  . Esophageal banding N/A 01/01/2014    Procedure: ESOPHAGEAL BANDING;  Surgeon: Jerene Bears, MD;  Location: [facility] ENDOSCOPY;  Service: Endoscopy;  Laterality: N/A;  . Coronary stent placement  06/30/2010    CFX   Distal        . Left heart catheterization with coronary angiogram N/A 10/15/2014    Procedure: LEFT HEART CATHETERIZATION  WITH CORONARY ANGIOGRAM;  Surgeon: Peter M Martinique, MD;  Location: [facility] CATH LAB;  Service: Cardiovascular;  Laterality: N/A;  . Tee without cardioversion N/A 04/20/2015    Procedure: TRANSESOPHAGEAL ECHOCARDIOGRAM (TEE);  Surgeon: Fay Records, MD;  Location: [facility] ENDOSCOPY;  Service: Cardiovascular;  Laterality: N/A;    Social History:  reports that he has been smoking E-cigarettes.  He has a 36 pack-year smoking history. He has never used smokeless tobacco. He reports that he does not drink alcohol or use illicit drugs.  Family History:  Family History  Problem Relation Age of Onset  . Heart attack Brother 69    MI  . Diabetes Brother   . Heart attack Father   . Diabetes Father   . COPD Father   . COPD Mother   . Stroke Neg Hx   . Heart attack Brother      Prior to Admission medications   Medication Sig Start Date End Date Taking? Authorizing Provider  aspirin 81 MG tablet Take 81 mg by mouth daily.     Historical Provider, MD  atorvastatin (LIPITOR) 10 MG tablet Take 10 mg by mouth daily.     Historical Provider, MD  furosemide (LASIX) 40 MG tablet Take 40 mg by mouth daily.  01/09/12   Larey Dresser, MD  insulin NPH Human (HUMULIN N,NOVOLIN N) 100 UNIT/ML injection Inject 0.35-0.8 mLs (35-80 Units total) into the skin See admin instructions. Take 80 units in the morning and 35 units in the evening. 04/20/15   Eugenie Filler, MD  lactulose (CHRONULAC) 10 GM/15ML solution Take 45 mLs (30 g total) by mouth every 6 (six) hours. May take an additional dose as needed for bowel movement 06/16/15   Thurnell Lose, MD  metFORMIN (GLUCOPHAGE) 1000 MG tablet Take 1,000 mg by mouth 2 (two) times daily. 11/27/14   Historical Provider, MD  Multiple Vitamin (MULTIVITAMIN) capsule Take 1 capsule by mouth daily.      Historical Provider, MD  nadolol (CORGARD) 40 MG tablet Take 1 tablet (40 mg total) by mouth daily. 01/01/15   Jerene Bears, MD  nitroGLYCERIN (NITROSTAT) 0.4 MG SL tablet Place 0.4 mg  under the tongue every 5 (five) minutes as needed for chest pain (chest pain).    Historical Provider, MD  omeprazole (PRILOSEC) 20 MG capsule Take 1 capsule (20 mg total) by mouth daily. 01/15/15   Larey Dresser, MD  ranolazine (RANEXA) 500 MG 12 hr tablet Take 1 tablet (500 mg total) by mouth 2 (two) times daily. 06/10/15   Larey Dresser, MD  rifaximin (XIFAXAN) 550 MG TABS tablet Take 1 tablet (550 mg total) by mouth 2 (two) times daily. 02/13/15   Jerene Bears, MD  spironolactone (ALDACTONE) 25 MG tablet TAKE 1 TABLET EVERY DAY 05/13/15   Larey Dresser, MD    Physical Exam: Filed Vitals:   06/27/15 0130 06/27/15 0145 06/27/15 0215 06/27/15 0230  BP: 114/52 126/72 110/63 130/56  Pulse: 58 65 59  64  Temp:      TempSrc:      Resp: 15 24 15 15   SpO2: 95% 96% 95% 96%   General: Not in acute distress. Dry mucous and membrane HEENT:       Eyes: PERRL, EOMI, no scleral icterus.       ENT: No discharge from the ears and nose, no pharynx injection, no tonsillar enlargement.        Neck: No JVD, no bruit, no mass felt. Heme: No neck lymph node enlargement. Cardiac: S1/S2, RRR, No murmurs, No gallops or rubs. Pulm: No rales, wheezing, rhonchi or rubs. Abd: Soft, nondistended, no organomegaly, BS present. Ext: Trace pitting leg edema bilaterally. 2+DP/PT pulse bilaterally. Musculoskeletal: No joint deformities, No joint redness or warmth, no limitation of ROM in spin. Skin: No rashes.  Neuro: In coma,not oriented X3, cranial nerves II-XII grossly intact, moves all extremities upon painful stimulus. Psych: Patient is not psychotic, no suicidal or hemocidal ideation.  Labs on Admission:  Basic Metabolic Panel:  Recent Labs Lab 06/27/15 0047  NA 138  K 3.4*  CL 102  CO2 26  GLUCOSE 114*  BUN <5*  CREATININE 0.67  CALCIUM 8.5*   Liver Function Tests:  Recent Labs Lab 06/27/15 0047  AST 84*  ALT 41  ALKPHOS 61  BILITOT 1.3*  PROT 7.2  ALBUMIN 4.0   No results for  input(s): LIPASE, AMYLASE in the last 168 hours.  Recent Labs Lab 06/27/15 0057  AMMONIA 297*   CBC:  Recent Labs Lab 06/27/15 0047  WBC 7.1  HGB 13.2  HCT 39.1  MCV 99.5  PLT 96*   Cardiac Enzymes: No results for input(s): CKTOTAL, CKMB, CKMBINDEX, TROPONINI in the last 168 hours.  BNP (last 3 results)  Recent Labs  10/14/14 2248 06/05/15 2325  BNP 56.1 37.5    ProBNP (last 3 results)  Recent Labs  12/11/14 0913  PROBNP 38.0    CBG:  Recent Labs Lab 06/27/15 0042  GLUCAP 115*    Radiological Exams on Admission: Ct Head Wo Contrast  06/27/2015  CLINICAL DATA:  Altered mental status. Patient is currently nonverbal but withdrawal is from pain. History of esophageal varices, cirrhosis, diabetes, COPD, hypertension. EXAM: CT HEAD WITHOUT CONTRAST TECHNIQUE: Contiguous axial images were obtained from the base of the skull through the vertex without intravenous contrast. COMPARISON:  06/14/2015 FINDINGS: Mild cerebral atrophy. No ventricular dilatation. Patchy low-attenuation changes in the deep white matter are nonspecific but likely represent small vessel ischemic change. Other white matter disease not excluded. No significant change since previous study. No mass effect or midline shift. No abnormal extra-axial fluid collections. Gray-white matter junctions are distinct. Basal cisterns are not effaced. No evidence of acute intracranial hemorrhage. No depressed skull fractures. Visualized paranasal sinuses and mastoid air cells are not opacified. IMPRESSION: No acute intracranial abnormalities. Mild chronic atrophy and white matter disease. Electronically Signed   By: Lucienne Capers M.D.   On: 06/27/2015 01:26   Dg Chest Port 1 View  06/27/2015  CLINICAL DATA:  Altered mental status. EXAM: PORTABLE CHEST 1 VIEW COMPARISON:  06/14/2015 FINDINGS: Low lung volumes, similar to prior exam. Cardiomediastinal contours are unchanged. Mild left basilar atelectasis. No  pulmonary edema, confluent airspace disease, large pleural effusion or pneumothorax. No acute osseous abnormalities. Remote mid left clavicle fracture again seen. IMPRESSION: Hypoventilatory chest with mild left basilar atelectasis. Electronically Signed   By: Jeb Levering M.D.   On: 06/27/2015 03:20    EKG: Independently  reviewed. QTC 506, T flattening diffusely, Q wave in inferior leads which is old, early R-wave progression  Assessment/Plan Principal Problem:   Hepatic encephalopathy (HCC) Active Problems:   HLD (hyperlipidemia)   CAD, NATIVE VESSEL   DIASTOLIC HEART FAILURE, CHRONIC   Esophageal varices (HCC)   Type 2 diabetes mellitus, uncontrolled (HCC)   PFO (patent foramen ovale): Per TEE 04/20/2015   Tobacco abuse   Thrombocytopenia (HCC)   Liver cirrhosis secondary to NASH  Hepatic encephalopathy ([place]): Patient's altered mental status is most likely due to hepatic encephalopathy. Ammonia level is 297, though not diagnostic, but consistent with this diagnosis. No signs of infection or SBP. CT head is negative for acute abnormalities.   -will admit to SDU -Lactulose 30 g q6h per rectal -Frequent neuro check -blood culture -IVF: NS 75 cc/h  HLD: Last LDL was 52 on 06/25/15 -Hold oral home medications: Lipitor  CAD: s/p of stent. -Switch oral ASA to per rectal -Switch nadalol to IV metoprolol (put holding parameter) -Hold ranolazine  DIASTOLIC HEART FAILURE, CHRONIC: 2-D echo 04/17/15 showed EF 55-60%. Patient is on Lasix and spironolactone. CHF is compensated. -Hold diuretics -Check BNP  DM-II: Last A1c 6.8 on 06/14/15, well controled. Patient is taking metformin and Novolin at home -SSI  Tobacco abuse: -Nicotine patch  Thrombocytopenia ([place]): likely due to cirrhosis. Previously to 96, no bleeding tendency -Follow-up as CBC  Liver cirrhosis secondary to NASH: On Lasix, rifaximin and spironolactone at home. -Hold oral medications now -  INR/PTT  GERD: -Protonix IV  Hypokalemia: K= 3.4 on admission. - Repleted  DVT ppx: SCD  Code Status: Full code Family Communication: Yes, patient's wife at bed side Disposition Plan: Admit to inpatient   Date of Service 06/27/2015    Ivor Costa [facility] Pager [phone]  If 7PM-7AM, please contact night-coverage [email or web address] Password [email or web address] 06/27/2015, 3:41 AM

## 2015-06-27 NOTE — Progress Notes (Signed)
16 fr foley placed per MD order/protocol d/t acute urinary retention. Pt tolerated procedure well Consuelo Pandy, RN present as second person during insertion. Peri care care with soap and water provided prior to insertion and sterile procedure maintained throughout. Will continue to monitor.

## 2015-06-27 NOTE — ED Notes (Signed)
Per EMS, pt from home with altered mental status. Pt has hx liver cirrhosis. Per EMS pt only arousable to painful stimulation, with a GCS on scene of 9. BP- 131/77, O2- 98% RA, P-60s, CBG-179

## 2015-06-27 NOTE — Progress Notes (Signed)
PT Cancellation Note  Patient Details Name: Adam Butler MRN: [id number] DOB: Feb 21, 1956   Cancelled Treatment:    Reason Eval/Treat Not Completed: Medical issues which prohibited therapy (Per RN pt is minimally responsive.  ).  Pt is on strict bed rest.  Will need new activity orders as pt progresses so PT evaluation may be completed.  Thank you for this order.  Joslyn Hy PT, DPT [phone] Pager: [phone] 06/27/2015, 10:45 AM

## 2015-06-27 NOTE — ED Notes (Signed)
Patient transported to CT 

## 2015-06-27 NOTE — Progress Notes (Signed)
[facility] TEAM 1 - Stepdown/ICU TEAM Progress Note  Adam Butler [id number]:[id number] DOB: May 03, 1956 DOA: 06/27/2015 PCP: [name], NP  Admit HPI / Brief Narrative: 59 y.o. WM PMHx of liver cirrhosis due to NASH, CAD, S/P stent placement, GI bleeding, esophageal varices, rectal varices, hepatic encephalopathy, thrombocytopenia, PFO, hypertension, hyperlipidemia, diabetes mellitus, GERD, who presents with altered mental status.  Per patient's wife, patient has been in his normal baseline health until tonight when he become confused, which has been progressively getting worse and finally he becomes unresponsive. He did not complain abdominal pain, fever, chills, chest pain, shortness of breath, cough, unilateral weakness to his wife before he became confused. Wife states that the patient has been compliant to his home medications including lactulose 30 g 3 times a day. He usually has good bowel movements, but not today.   In ED, patient was found to have ammonia 297, WBC 7.1, temperature normal, slightly bradycardia, negative urinalysis, potassium 3.4, renal function okay, negative CT head for acute abnormalities. Pending chest x-ray. Patient is admitted to inpatient for further evaluation treatment.  HPI/Subjective: 12/17 A/O 1 (does not work all where, when, why), follows all commands.  Assessment/Plan: Hepatic encephalopathy ([place]): Patient's altered mental status is most likely due to hepatic encephalopathy. Ammonia level is 297, though not diagnostic, but consistent with this diagnosis. No signs of infection or SBP. CT head is negative for acute abnormalities.  -Lactulose 45 g  TID -Frequent neuro check -blood culture -IVF: NS 75 cc/h -Swallow study  HLD: Last LDL was 52 on 06/25/15 -Hold oral home medications: Lipitor  CAD:  -s/p of stent. -Switch oral ASA to per rectal -Switch nadalol to IV metoprolol (put holding parameter) -Hold ranolazine  DIASTOLIC HEART FAILURE,  CHRONIC: 2-D echo 04/17/15 showed EF 55-60%. Patient is on Lasix and spironolactone. CHF is compensated. -Hold diuretics -Check BNP  DM-II:  -Last A1c 6.8 on 06/14/15, well controled. Patient is taking metformin and Novolin at home -SSI  Tobacco abuse: -Nicotine patch  Thrombocytopenia ([place]): likely due to cirrhosis. Previously to 96, no bleeding tendency -Follow-up as CBC  Liver cirrhosis secondary to NASH: On Lasix, rifaximin and spironolactone at home. -Hold oral medications now - INR/PTT  GERD: -Protonix IV  Hypokalemia: K= 3.4 on admission. - Repleted   Code Status: FULL Family Communication: Wife present at time of exam Disposition Plan: Resolution hepatic encephalopathy    Consultants: NA  Procedure/Significant Events:    Culture NA  Antibiotics: NA  DVT prophylaxis: SCD   Devices    LINES / TUBES:      Continuous Infusions: . sodium chloride 75 mL/hr at 06/27/15 0349    Objective: VITAL SIGNS: Temp: 98.1 F (36.7 C) (12/17 1603) Temp Source: Axillary (12/17 1603) BP: 115/54 mmHg (12/17 1400) Pulse Rate: 73 (12/17 1400) SPO2; FIO2:   Intake/Output Summary (Last 24 hours) at 06/27/15 1637 Last data filed at 06/27/15 1604  Gross per 24 hour  Intake    153 ml  Output    900 ml  Net   -747 ml     Exam: General:  A/O 1 (does not know where, when, why), follows all commands.No acute respiratory distress Eyes: Negative headache,negative scleral hemorrhage ENT: Negative Runny nose, negative ear pain, negative gingival bleeding, Neck:  Negative scars, masses, torticollis, lymphadenopathy, JVD Lungs: Clear to auscultation bilaterally without wheezes or crackles Cardiovascular: Regular rate and rhythm without murmur gallop or rub normal S1 and S2 Abdomen:negative abdominal pain, morbidly obese, positive soft, bowel sounds, no  rebound, no ascites, no appreciable mass Extremities: No significant cyanosis, clubbing, mild bilateral lower  extremities edema (baseline per wife) Psychiatric:  Negative depression, negative anxiety, negative fatigue, negative mania  Neurologic:  Cranial nerves II through XII intact, tongue/uvula midline, all extremities muscle strength 5/5, sensation intact throughout, negative dysarthria, negative expressive aphasia, negative receptive aphasia.   Data Reviewed: Basic Metabolic Panel:  Recent Labs Lab 06/27/15 0047 06/27/15 0522  NA 138 139  K 3.4* 3.9  CL 102 106  CO2 26 24  GLUCOSE 114* 131*  BUN <5* 8  CREATININE 0.67 0.80  CALCIUM 8.5* 8.9   Liver Function Tests:  Recent Labs Lab 06/27/15 0047 06/27/15 0522  AST 84* 68*  ALT 41 45  ALKPHOS 61 164*  BILITOT 1.3* 1.1  PROT 7.2 6.4*  ALBUMIN 4.0 2.6*   No results for input(s): LIPASE, AMYLASE in the last 168 hours.  Recent Labs Lab 06/27/15 0057  AMMONIA 297*   CBC:  Recent Labs Lab 06/27/15 0047 06/27/15 0522  WBC 7.1 3.1*  HGB 13.2 11.5*  HCT 39.1 33.7*  MCV 99.5 100.0  PLT 96* 53*   Cardiac Enzymes: No results for input(s): CKTOTAL, CKMB, CKMBINDEX, TROPONINI in the last 168 hours. BNP (last 3 results)  Recent Labs  10/14/14 2248 06/05/15 2325 06/27/15 0520  BNP 56.1 37.5 35.3    ProBNP (last 3 results)  Recent Labs  12/11/14 0913  PROBNP 38.0    CBG:  Recent Labs Lab 06/27/15 0042 06/27/15 0803 06/27/15 1145 06/27/15 1602  GLUCAP 115* 117* 127* 121*    No results found for this or any previous visit (from the past 240 hour(s)).   Studies:  Recent x-ray studies have been reviewed in detail by the Attending Physician  Scheduled Meds:  Scheduled Meds: . aspirin  300 mg Rectal Daily  . insulin aspart  0-9 Units Subcutaneous TID WC  . lactulose  45 g Oral TID  . metoprolol  2.5 mg Intravenous 3 times per day  . nicotine  21 mg Transdermal Daily  . pantoprazole (PROTONIX) IV  40 mg Intravenous Q24H  . sodium chloride  3 mL Intravenous Q12H    Time spent on care of this  patient: 40 mins   WOODS, Geraldo Docker , MD  [facility] Office  [phone] Pager - [phone]  On-Call/Text Page:      [email or web address]      password [email or web address]  If 7PM-7AM, please contact night-coverage [email or web address] Password [email or web address] 06/27/2015, 4:37 PM   LOS: 0 days   Care during the described time interval was provided by me .  I have reviewed this patient's available data, including medical history, events of note, physical examination, and all test results as part of my evaluation. I have personally reviewed and interpreted all radiology studies.   Dia [place], MD [phone] Pager

## 2015-06-28 DIAGNOSIS — I2510 Atherosclerotic heart disease of native coronary artery without angina pectoris: Secondary | ICD-10-CM

## 2015-06-28 DIAGNOSIS — R4182 Altered mental status, unspecified: Secondary | ICD-10-CM | POA: Diagnosis present

## 2015-06-28 DIAGNOSIS — N182 Chronic kidney disease, stage 2 (mild): Secondary | ICD-10-CM

## 2015-06-28 DIAGNOSIS — E876 Hypokalemia: Secondary | ICD-10-CM

## 2015-06-28 DIAGNOSIS — E1122 Type 2 diabetes mellitus with diabetic chronic kidney disease: Secondary | ICD-10-CM | POA: Diagnosis present

## 2015-06-28 DIAGNOSIS — R410 Disorientation, unspecified: Secondary | ICD-10-CM

## 2015-06-28 LAB — URINE CULTURE: Culture: 1000

## 2015-06-28 LAB — GLUCOSE, CAPILLARY
GLUCOSE-CAPILLARY: 134 mg/dL — AB (ref 65–99)
GLUCOSE-CAPILLARY: 151 mg/dL — AB (ref 65–99)
GLUCOSE-CAPILLARY: 72 mg/dL (ref 65–99)
Glucose-Capillary: 139 mg/dL — ABNORMAL HIGH (ref 65–99)
Glucose-Capillary: 151 mg/dL — ABNORMAL HIGH (ref 65–99)
Glucose-Capillary: 189 mg/dL — ABNORMAL HIGH (ref 65–99)
Glucose-Capillary: 242 mg/dL — ABNORMAL HIGH (ref 65–99)

## 2015-06-28 LAB — AMMONIA: AMMONIA: 68 umol/L — AB (ref 9–35)

## 2015-06-28 MED ORDER — NADOLOL 20 MG PO TABS
20.0000 mg | ORAL_TABLET | Freq: Every day | ORAL | Status: DC
  Administered 2015-06-29: 20 mg via ORAL
  Filled 2015-06-28 (×3): qty 1

## 2015-06-28 MED ORDER — METFORMIN HCL 500 MG PO TABS
1000.0000 mg | ORAL_TABLET | Freq: Two times a day (BID) | ORAL | Status: DC
  Administered 2015-06-28 – 2015-06-30 (×4): 1000 mg via ORAL
  Filled 2015-06-28 (×4): qty 2

## 2015-06-28 MED ORDER — ASPIRIN 325 MG PO TABS
325.0000 mg | ORAL_TABLET | Freq: Every day | ORAL | Status: DC
  Administered 2015-06-28: 325 mg via NASOGASTRIC
  Filled 2015-06-28: qty 1

## 2015-06-28 MED ORDER — PANTOPRAZOLE SODIUM 40 MG PO PACK
40.0000 mg | PACK | Freq: Every day | ORAL | Status: DC
  Filled 2015-06-28: qty 20

## 2015-06-28 MED ORDER — RIFAXIMIN 550 MG PO TABS
550.0000 mg | ORAL_TABLET | Freq: Two times a day (BID) | ORAL | Status: DC
  Administered 2015-06-28 – 2015-06-30 (×4): 550 mg via ORAL
  Filled 2015-06-28 (×4): qty 1

## 2015-06-28 MED ORDER — PANTOPRAZOLE SODIUM 40 MG PO TBEC
40.0000 mg | DELAYED_RELEASE_TABLET | Freq: Every day | ORAL | Status: DC
  Administered 2015-06-28 – 2015-06-30 (×3): 40 mg via ORAL
  Filled 2015-06-28 (×3): qty 1

## 2015-06-28 MED ORDER — SPIRONOLACTONE 25 MG PO TABS
12.5000 mg | ORAL_TABLET | Freq: Every day | ORAL | Status: DC
  Administered 2015-06-28 – 2015-06-29 (×2): 12.5 mg via ORAL
  Filled 2015-06-28 (×2): qty 1

## 2015-06-28 MED ORDER — ASPIRIN 325 MG PO TABS
325.0000 mg | ORAL_TABLET | Freq: Every day | ORAL | Status: DC
  Administered 2015-06-29: 325 mg via ORAL
  Filled 2015-06-28: qty 1

## 2015-06-28 MED ORDER — FUROSEMIDE 40 MG PO TABS
40.0000 mg | ORAL_TABLET | Freq: Every day | ORAL | Status: DC
  Administered 2015-06-28 – 2015-06-30 (×3): 40 mg via ORAL
  Filled 2015-06-28 (×3): qty 1

## 2015-06-28 NOTE — Progress Notes (Signed)
[facility] TEAM 1 - Stepdown/ICU TEAM Progress Note  TANVEER BRAMMER BDZ:[id number] DOB: [birth year] DOA: 06/27/2015 PCP: [name], NP  Admit HPI / Brief Narrative: 59 y.o. WM PMHx of liver cirrhosis due to NASH, CAD, S/P stent placement, GI bleeding, esophageal varices, rectal varices, hepatic encephalopathy, thrombocytopenia, PFO, hypertension, hyperlipidemia, diabetes mellitus, GERD, who presents with altered mental status.  Per patient's wife, patient has been in his normal baseline health until tonight when he become confused, which has been progressively getting worse and finally he becomes unresponsive. He did not complain abdominal pain, fever, chills, chest pain, shortness of breath, cough, unilateral weakness to his wife before he became confused. Wife states that the patient has been compliant to his home medications including lactulose 30 g 3 times a day. He usually has good bowel movements, but not today.   In ED, patient was found to have ammonia 297, WBC 7.1, temperature normal, slightly bradycardia, negative urinalysis, potassium 3.4, renal function okay, negative CT head for acute abnormalities. Pending chest x-ray. Patient is admitted to inpatient for further evaluation treatment.  HPI/Subjective: 12/18 A/O 4, NAD. Patient sees Dr.Jay Everitt Amber, GI appears last appointment was on 05/19/2015 at which point he was instructed to continue Rifaximin 550 mg twice a day, Lactulose 1-3 times daily titrated to ensure 3-5 soft but formed bowel movements daily,Lasix 40 mg daily,  spironolactone 25 mg daily, Nadolol 40 mg daily, and Omeprazole 20 mg once daily   Assessment/Plan: Hepatic encephalopathy ([place]):  -Patient's altered mental status is most likely due to hepatic encephalopathy. Ammonia level is 297, on  -No signs of infection or SBP. CT head is negative for acute abnormalities.  -Continue Lactulose 45 g  TID -12/18 NH4=68 -blood culture -IVF: NS 50 ml/hr  -Swallow study;  passed  -Restart rifaximin 550 mg BID  HLD: Last LDL was 52 on 06/25/15 -Hold oral home medications: Lipitor  CAD native artery:  -s/p of stent. -Aspirin 325 mg daily  -Restart nadolol at 1/2 home dose 20 mg daily  -Restart spironolactone at 1/2 home dose 12.5 mg daily  DIASTOLIC HEART FAILURE, CHRONIC:  -2-D echo 04/17/15 showed EF 55-60%. Patient is on Lasix and  -See CAD and hepatic encephalopathy  DM-II: Controlled  -12/4 hemoglobin A1c =6.8 on 06/14/15, -Restart home Metformin BID -Hold patient's home NPH  -Continue sensitive SSI  Tobacco abuse: -Nicotine patch  Thrombocytopenia ([place]):  -Likely due to cirrhosis. Previously to 96, no bleeding tendency -Follow-up as CBC  Liver cirrhosis secondary to NASH: On Lasix, rifaximin and spironolactone at home. -Hold oral medications now - INR/PTT  GERD: -Protonix IV  Hypokalemia:  -K= 3.4 on admission. - Repleted   Code Status: FULL Family Communication: No family present at time of exam Disposition Plan: Discharge in a.m.?    Consultants: NA  Procedure/Significant Events:    Culture NA  Antibiotics: NA  DVT prophylaxis: SCD   Devices    LINES / TUBES:      Continuous Infusions: . sodium chloride 50 mL/hr at 06/28/15 1800    Objective: VITAL SIGNS: Temp: 97.8 F (36.6 C) (12/18 1918) Temp Source: Oral (12/18 1918) BP: 128/63 mmHg (12/18 1755) Pulse Rate: 71 (12/18 1755) SPO2; FIO2:   Intake/Output Summary (Last 24 hours) at 06/28/15 2007 Last data filed at 06/28/15 1600  Gross per 24 hour  Intake   1545 ml  Output   1070 ml  Net    475 ml     Exam: General:  A/O 4, NAD,  No acute respiratory distress Eyes: Negative headache,negative scleral hemorrhage ENT: Negative Runny nose, negative ear pain, negative gingival bleeding, Neck:  Negative scars, masses, torticollis, lymphadenopathy, JVD Lungs: Clear to auscultation bilaterally without wheezes or crackles Cardiovascular:  Regular rate and rhythm without murmur gallop or rub normal S1 and S2 Abdomen:negative abdominal pain, morbidly obese, positive soft, bowel sounds, no rebound, no ascites, no appreciable mass Extremities: No significant cyanosis, clubbing, mild bilateral lower extremities edema (baseline per wife) Psychiatric:  Negative depression, negative anxiety, negative fatigue, negative mania  Neurologic:  Cranial nerves II through XII intact, tongue/uvula midline, all extremities muscle strength 5/5, sensation intact throughout, negative dysarthria, negative expressive aphasia, negative receptive aphasia.   Data Reviewed: Basic Metabolic Panel:  Recent Labs Lab 06/27/15 0047 06/27/15 0522  NA 138 139  K 3.4* 3.9  CL 102 106  CO2 26 24  GLUCOSE 114* 131*  BUN <5* 8  CREATININE 0.67 0.80  CALCIUM 8.5* 8.9   Liver Function Tests:  Recent Labs Lab 06/27/15 0047 06/27/15 0522  AST 84* 68*  ALT 41 45  ALKPHOS 61 164*  BILITOT 1.3* 1.1  PROT 7.2 6.4*  ALBUMIN 4.0 2.6*   No results for input(s): LIPASE, AMYLASE in the last 168 hours.  Recent Labs Lab 06/27/15 0057 06/28/15 0402  AMMONIA 297* 68*   CBC:  Recent Labs Lab 06/27/15 0047 06/27/15 0522  WBC 7.1 3.1*  HGB 13.2 11.5*  HCT 39.1 33.7*  MCV 99.5 100.0  PLT 96* 53*   Cardiac Enzymes: No results for input(s): CKTOTAL, CKMB, CKMBINDEX, TROPONINI in the last 168 hours. BNP (last 3 results)  Recent Labs  10/14/14 2248 06/05/15 2325 06/27/15 0520  BNP 56.1 37.5 35.3    ProBNP (last 3 results)  Recent Labs  12/11/14 0913  PROBNP 38.0    CBG:  Recent Labs Lab 06/28/15 0022 06/28/15 0455 06/28/15 0811 06/28/15 1154 06/28/15 1550  GLUCAP 151* 139* 134* 151* 242*    Recent Results (from the past 240 hour(s))  Urine culture     Status: None   Collection Time: 06/27/15 12:56 AM  Result Value Ref Range Status   Specimen Description URINE, CATHETERIZED  Final   Special Requests NONE  Final   Culture  1,000 COLONIES/mL INSIGNIFICANT GROWTH  Final   Report Status 06/28/2015 FINAL  Final  Culture, blood (Routine X 2) w Reflex to ID Panel     Status: None (Preliminary result)   Collection Time: 06/27/15  1:34 AM  Result Value Ref Range Status   Specimen Description BLOOD RIGHT HAND  Final   Special Requests BOTTLES DRAWN AEROBIC AND ANAEROBIC 5ML  Final   Culture NO GROWTH 1 DAY  Final   Report Status PENDING  Incomplete  Culture, blood (Routine X 2) w Reflex to ID Panel     Status: None (Preliminary result)   Collection Time: 06/27/15  1:37 AM  Result Value Ref Range Status   Specimen Description BLOOD LEFT HAND  Final   Special Requests BOTTLES DRAWN AEROBIC AND ANAEROBIC 5ML  Final   Culture NO GROWTH 1 DAY  Final   Report Status PENDING  Incomplete     Studies:  Recent x-ray studies have been reviewed in detail by the Attending Physician  Scheduled Meds:  Scheduled Meds: . aspirin  325 mg Oral Daily  . furosemide  40 mg Oral Daily  . insulin aspart  0-9 Units Subcutaneous TID WC  . lactulose  45 g Oral TID  . metFORMIN  1,000 mg Oral BID WC  . nadolol  20 mg Oral Daily  . nicotine  21 mg Transdermal Daily  . pantoprazole  40 mg Oral Daily  . rifaximin  550 mg Oral BID  . sodium chloride  3 mL Intravenous Q12H  . spironolactone  12.5 mg Oral Daily    Time spent on care of this patient: 40 mins   Marsh Heckler, Geraldo Docker , MD  [facility] Office  [phone] Pager - [phone]  On-Call/Text Page:      [email or web address]      password [email or web address]  If 7PM-7AM, please contact night-coverage [email or web address] Password [email or web address] 06/28/2015, 8:07 PM   LOS: 1 day   Care during the described time interval was provided by me .  I have reviewed this patient's available data, including medical history, events of note, physical examination, and all test results as part of my evaluation. I have personally reviewed and interpreted all radiology studies.   Dia Crawford, MD [phone]  Pager

## 2015-06-28 NOTE — Evaluation (Signed)
Physical Therapy Evaluation Patient Details Name: Adam Butler MRN: [id number] DOB: Oct 26, 1955 Today's Date: 06/28/2015   History of Present Illness  Pt is a 59 y/o M admitted w/ with altered mental status due to hepatic encephalitis. Pt's PMH includes CAD, s/p stent placement.  Clinical Impression  Pt admitted with above diagnosis. Pt currently with functional limitations due to the deficits listed below (see PT Problem List). [name] perseverating and threatening to walk on his own w/o assist.  Chair alarm set at end of session and wife in room.  Wife and family available at d/c to assist pt 24/7.  Currently pt requires supervision for safety while ambulating. Pt will benefit from skilled PT to increase their independence and safety with mobility to allow discharge to the venue listed below.      Follow Up Recommendations No PT follow up;Supervision for mobility/OOB    Equipment Recommendations  None recommended by PT (wife reports that she will try to find RW at home)    Recommendations for Other Services       Precautions / Restrictions Precautions Precautions: Fall Restrictions Weight Bearing Restrictions: No      Mobility  Bed Mobility Overal bed mobility: Modified Independent             General bed mobility comments: HOB elevated and use of bed rail.  No physical assist or cues needed.  Transfers Overall transfer level: Needs assistance Equipment used: Rolling walker (2 wheeled) Transfers: Sit to/from Stand Sit to Stand: Min guard         General transfer comment: Cues for hand placement as pt initially attempting to pull on RW to stand.  Uncontrolled descent to chair.  Ambulation/Gait Ambulation/Gait assistance: Supervision Ambulation Distance (Feet): 200 Feet Assistive device: Rolling walker (2 wheeled) Gait Pattern/deviations: Step-through pattern   Gait velocity interpretation: at or above normal speed for age/gender General Gait Details:  Pt able to maintain stability using RW w/ head turns.  Supervision for pt's safety.  Stairs            Wheelchair Mobility    Modified Rankin (Stroke Patients Only)       Balance Overall balance assessment: Needs assistance Sitting-balance support: Feet supported Sitting balance-Leahy Scale: Good     Standing balance support: Bilateral upper extremity supported;During functional activity Standing balance-Leahy Scale: Fair                               Pertinent Vitals/Pain Pain Assessment: No/denies pain    Home Living Family/patient expects to be discharged to:: Private residence Living Arrangements: Spouse/significant other;Children (daughter, son in law and their child) Available Help at Discharge: Family;Available 24 hours/day   Home Access: Stairs to enter Entrance Stairs-Rails: Can reach both;Left;Right Entrance Stairs-Number of Steps: 3 Home Layout: One level Home Equipment: Cane - single point      Prior Function Level of Independence: Independent with assistive device(s)         Comments: Uses cane if walking long distances     Hand Dominance   Dominant Hand: Right    Extremity/Trunk Assessment   Upper Extremity Assessment: Overall WFL for tasks assessed           Lower Extremity Assessment: Overall WFL for tasks assessed         Communication   Communication: No difficulties  Cognition Arousal/Alertness: Awake/alert Behavior During Therapy: Impulsive;Agitated (continues demanding to see his doctor because he is  hungry) Overall Cognitive Status: Impaired/Different from baseline Area of Impairment: Attention;Safety/judgement   Current Attention Level: Sustained     Safety/Judgement: Decreased awareness of safety;Decreased awareness of deficits     General Comments: Perseveration w/ pt repeating "y'all aren't gonna get me down!"  w/o context.  Pt continues to say he is going to walk on his own despite multiple  attempts to educate pt on importance of having assist.  Chair alarm set at end of session and wife in room.    General Comments      Exercises General Exercises - Lower Extremity Ankle Circles/Pumps: AROM;Both;10 reps;Seated      Assessment/Plan    PT Assessment Patient needs continued PT services  PT Diagnosis Altered mental status;Difficulty walking   PT Problem List Decreased cognition;Decreased balance;Decreased safety awareness;Obesity  PT Treatment Interventions DME instruction;Gait training;Stair training;Functional mobility training;Therapeutic activities;Therapeutic exercise;Balance training;Neuromuscular re-education;Cognitive remediation;Patient/family education   PT Goals (Current goals can be found in the Care Plan section) Acute Rehab PT Goals Patient Stated Goal: determined to go home as soon as possible (specifically he says he will be home before Christmas) PT Goal Formulation: With patient/family Time For Goal Achievement: 07/12/15 Potential to Achieve Goals: Good    Frequency Min 2X/week   Barriers to discharge Inaccessible home environment steps to enter home    Co-evaluation               End of Session Equipment Utilized During Treatment: Gait belt Activity Tolerance: Patient tolerated treatment well Patient left: in chair;with call bell/phone within reach;with chair alarm set;with family/visitor present Nurse Communication: Mobility status         Time: 7622-6333 PT Time Calculation (min) (ACUTE ONLY): 28 min   Charges:   PT Evaluation $Initial PT Evaluation Tier I: 1 Procedure PT Treatments $Gait Training: 8-22 mins   PT G CodesJoslyn Hy PT, DPT [phone] Pager: [phone] 06/28/2015, 1:36 PM

## 2015-06-28 NOTE — Evaluation (Signed)
Clinical/Bedside Swallow Evaluation Patient Details  Name: Adam Butler MRN: [id number] Date of Birth: 1956/01/07  Today's Date: 06/28/2015 Time: SLP Start Time (ACUTE ONLY): 1200 SLP Stop Time (ACUTE ONLY): 1208 SLP Time Calculation (min) (ACUTE ONLY): 8 min  Past Medical History:  Past Medical History  Diagnosis Date  . Cirrhosis ([place]) 2011    Cryptogenic, Likely NASH. Family/pt deny EtOH. HCV, HBV, HAV negative. ANA negative. AMA positive. Ascites 12/11  . Hyperlipemia   . Coronary artery disease     Inferior MI 12/11; LHC with occluded mid CFX and 80% proximal RCA. EF 55%. He had 3.0 x 28 vision BMS to CFX  . Diastolic CHF, acute ([place])     Echo 12/11 with ef 50-55% and mild LVH. EF 55% by LV0gram in 12/11  . Hypertension   . Type II diabetes mellitus ([place]) 2011  . SVT (supraventricular tachycardia) ([place])     1/12: appeared to be an ectopic atrial tachycardia. Required DCCV with hemodynamic instability  . GI bleed     12/11: Etiology not clearly defined. EGD: nonbleeding esophageal varices.  . S/P coronary artery stent placement 06/2010  . Esophageal varices ([place]) 2011, 2013    no hx acute variceal bleed  . Hepatic encephalopathy ([place]) 2011, 12/2013  . Myocardial infarction [facility]) ? 2012  . Shortness of breath dyspnea   . Arthritis   . Hx of echocardiogram     Echo 5/16:  EF 55-60%, no RWMA, mod LAE  . Thrombocytopenia ([place])   . Cholelithiasis   . Rectal varices   . PFO (patent foramen ovale): Per TEE 04/20/2015 04/20/2015  . Bacteremia   . Portal hypertension ([place])   . Rectal varices    Past Surgical History:  Past Surgical History  Procedure Laterality Date  . Cardiac catheterization  07/01/2010    BMS to CFX.  Marland Kitchen Appendectomy    . Orif r leg    . Esophagogastroduodenoscopy  04/13/2012    Procedure: ESOPHAGOGASTRODUODENOSCOPY (EGD);  Surgeon: Inda Castle, MD;  Location: [facility] ENDOSCOPY;  Service: Endoscopy;  Laterality: N/A;  . Colonoscopy  04/13/2012   Procedure: COLONOSCOPY;  Surgeon: Inda Castle, MD;  Location: [facility] ENDOSCOPY;  Service: Endoscopy;  Laterality: N/A;  . Esophagogastroduodenoscopy N/A 01/01/2014    Procedure: ESOPHAGOGASTRODUODENOSCOPY (EGD);  Surgeon: Jerene Bears, MD;  Location: [facility] ENDOSCOPY;  Service: Endoscopy;  Laterality: N/A;  . Esophageal banding N/A 01/01/2014    Procedure: ESOPHAGEAL BANDING;  Surgeon: Jerene Bears, MD;  Location: [facility] ENDOSCOPY;  Service: Endoscopy;  Laterality: N/A;  . Coronary stent placement  06/30/2010    CFX   Distal        . Left heart catheterization with coronary angiogram N/A 10/15/2014    Procedure: LEFT HEART CATHETERIZATION WITH CORONARY ANGIOGRAM;  Surgeon: Peter M Martinique, MD;  Location[facility] CATH LAB;  Service: Cardiovascular;  Laterality: N/A;  . Tee without cardioversion N/A 04/20/2015    Procedure: TRANSESOPHAGEAL ECHOCARDIOGRAM (TEE);  Surgeon: Fay Records, MD;  Location: [facility] ENDOSCOPY;  Service: Cardiovascular;  Laterality: N/A;   HPI:  59 y.o. WM PMHx of liver cirrhosis due to NASH, CAD, S/P stent placement, GI bleeding, esophageal varices, rectal varices, hepatic encephalopathy, thrombocytopenia, PFO, hypertension, hyperlipidemia, diabetes mellitus, GERD, who presents with altered mental status due to hepatic encephalitis.    Assessment / Plan / Recommendation Clinical Impression  Pt demonstrates normal swallow function. Pt may initiate a regular diet and thin liquids. No SLP f/u needed, will sign off.  Aspiration Risk  Mild aspiration risk    Diet Recommendation Regular;Thin liquid   Liquid Administration via: Cup;Straw Medication Administration: Whole meds with liquid Supervision: Patient able to self feed Postural Changes: Seated upright at 90 degrees    Other  Recommendations     Follow up Recommendations  None    Frequency and Duration            Prognosis        Swallow Study   General HPI: 59 y.o. WM PMHx of liver cirrhosis due to NASH, CAD, S/P stent  placement, GI bleeding, esophageal varices, rectal varices, hepatic encephalopathy, thrombocytopenia, PFO, hypertension, hyperlipidemia, diabetes mellitus, GERD, who presents with altered mental status due to hepatic encephalitis.  Type of Study: Bedside Swallow Evaluation Previous Swallow Assessment: BSE 10/16 - regular thin Diet Prior to this Study: NPO;NG Tube Temperature Spikes Noted: No Respiratory Status: Room air History of Recent Intubation: No Behavior/Cognition: Alert;Cooperative;Pleasant mood;Confused Oral Cavity Assessment: Within Functional Limits Oral Care Completed by SLP: No Oral Cavity - Dentition: Adequate natural dentition Vision: Functional for self-feeding Self-Feeding Abilities: Able to feed self Patient Positioning: Upright in chair Baseline Vocal Quality: Normal Volitional Cough: Congested;Strong Volitional Swallow: Able to elicit    Oral/Motor/Sensory Function Overall Oral Motor/Sensory Function: Within functional limits   Ice Chips     Thin Liquid Thin Liquid: Within functional limits Presentation: Cup;Straw    Nectar Thick Nectar Thick Liquid: Not tested   Honey Thick Honey Thick Liquid: Not tested   Puree Puree: Within functional limits   Solid Solid: Within functional limits      Adam [name], MA CCC-SLP [phone]  Adam Butler, Adam Butler 06/28/2015,12:14 PM

## 2015-06-29 LAB — COMPREHENSIVE METABOLIC PANEL
ALBUMIN: 2.6 g/dL — AB (ref 3.5–5.0)
ALK PHOS: 85 U/L (ref 38–126)
ALT: 41 U/L (ref 17–63)
ANION GAP: 9 (ref 5–15)
AST: 63 U/L — AB (ref 15–41)
BILIRUBIN TOTAL: 1.7 mg/dL — AB (ref 0.3–1.2)
BUN: 8 mg/dL (ref 6–20)
CALCIUM: 8.2 mg/dL — AB (ref 8.9–10.3)
CO2: 21 mmol/L — AB (ref 22–32)
CREATININE: 0.86 mg/dL (ref 0.61–1.24)
Chloride: 107 mmol/L (ref 101–111)
GFR calc Af Amer: >60 mL/min (ref >60)
GFR calc non Af Amer: >60 mL/min (ref >60)
GLUCOSE: 126 mg/dL — AB (ref 65–99)
Potassium: 3.7 mmol/L (ref 3.5–5.1)
SODIUM: 137 mmol/L (ref 135–145)
TOTAL PROTEIN: 5.9 g/dL — AB (ref 6.5–8.1)

## 2015-06-29 LAB — GLUCOSE, CAPILLARY
GLUCOSE-CAPILLARY: 138 mg/dL — AB (ref 65–99)
GLUCOSE-CAPILLARY: 141 mg/dL — AB (ref 65–99)
GLUCOSE-CAPILLARY: 172 mg/dL — AB (ref 65–99)
GLUCOSE-CAPILLARY: 147 mg/dL — AB (ref 65–99)
Glucose-Capillary: 156 mg/dL — ABNORMAL HIGH (ref 65–99)

## 2015-06-29 LAB — CBC WITH DIFFERENTIAL/PLATELET
BASOS PCT: 0 %
Basophils Absolute: 0 10*3/uL (ref 0.0–0.1)
Eosinophils Absolute: 0.1 10*3/uL (ref 0.0–0.7)
Eosinophils Relative: 4 %
HEMATOCRIT: 35.7 % — AB (ref 39.0–52.0)
HEMOGLOBIN: 12.2 g/dL — AB (ref 13.0–17.0)
Lymphocytes Relative: 37 %
Lymphs Abs: 1.4 10*3/uL (ref 0.7–4.0)
MCH: 34.8 pg — ABNORMAL HIGH (ref 26.0–34.0)
MCHC: 34.2 g/dL (ref 30.0–36.0)
MCV: 101.7 fL — ABNORMAL HIGH (ref 78.0–100.0)
MONOS PCT: 14 %
Monocytes Absolute: 0.5 10*3/uL (ref 0.1–1.0)
NEUTROS ABS: 1.7 10*3/uL (ref 1.7–7.7)
Neutrophils Relative %: 45 %
Platelets: 51 10*3/uL — ABNORMAL LOW (ref 150–400)
RBC: 3.51 MIL/uL — ABNORMAL LOW (ref 4.22–5.81)
RDW: 14.4 % (ref 11.5–15.5)
WBC: 3.7 10*3/uL — ABNORMAL LOW (ref 4.0–10.5)

## 2015-06-29 LAB — AMMONIA: AMMONIA: 87 umol/L — AB (ref 9–35)

## 2015-06-29 LAB — MAGNESIUM: Magnesium: 1.5 mg/dL — ABNORMAL LOW (ref 1.7–2.4)

## 2015-06-29 MED ORDER — POTASSIUM CHLORIDE CRYS ER 20 MEQ PO TBCR
20.0000 meq | EXTENDED_RELEASE_TABLET | Freq: Once | ORAL | Status: AC
  Administered 2015-06-29: 20 meq via ORAL
  Filled 2015-06-29: qty 1

## 2015-06-29 MED ORDER — SPIRONOLACTONE 25 MG PO TABS
25.0000 mg | ORAL_TABLET | Freq: Every day | ORAL | Status: DC
  Administered 2015-06-30: 25 mg via ORAL
  Filled 2015-06-29: qty 1

## 2015-06-29 MED ORDER — NADOLOL 20 MG PO TABS
20.0000 mg | ORAL_TABLET | Freq: Every day | ORAL | Status: DC
  Filled 2015-06-29: qty 1

## 2015-06-29 MED ORDER — MAGNESIUM SULFATE 2 GM/50ML IV SOLN
2.0000 g | Freq: Once | INTRAVENOUS | Status: AC
  Administered 2015-06-29: 2 g via INTRAVENOUS
  Filled 2015-06-29: qty 50

## 2015-06-29 MED ORDER — ASPIRIN 81 MG PO CHEW
81.0000 mg | CHEWABLE_TABLET | Freq: Every day | ORAL | Status: DC
  Administered 2015-06-30: 81 mg via ORAL
  Filled 2015-06-29 (×2): qty 1

## 2015-06-29 MED ORDER — NADOLOL 40 MG PO TABS: 40.0000 mg | ORAL_TABLET | Freq: Every day | ORAL | Status: DC

## 2015-06-29 NOTE — Evaluation (Signed)
Occupational Therapy Evaluation and Discharge Patient Details Name: Adam Butler MRN: [id number] DOB: 1956-07-04 Today's Date: 06/29/2015    History of Present Illness Pt is a 59 y/o M admitted w/ with altered mental status due to hepatic encephalitis. Pt's PMH includes CAD, s/p stent placement.   Clinical Impression   Pt was independent in self care and mobility prior to admission.  He had 24 hour supervision of his family. Pt currently performing at a supervision level.  No family available to determine baseline cognition. Pt minimizes fall risk, perseverates on going home, and is disoriented to date. Recommended pt consider sitting for showering for safety, but declining.   Follow Up Recommendations  No OT follow up    Equipment Recommendations       Recommendations for Other Services       Precautions / Restrictions Precautions Precautions: Fall Restrictions Weight Bearing Restrictions: No      Mobility Bed Mobility               General bed mobility comments: pt in chair  Transfers Overall transfer level: Needs assistance Equipment used: None   Sit to Stand: Supervision         General transfer comment: pt with decreased awareness of lines    Balance     Sitting balance-Leahy Scale: Good       Standing balance-Leahy Scale: Good                              ADL Overall ADL's : Needs assistance/impaired Eating/Feeding: Independent;Sitting   Grooming: Wash/dry hands;Supervision/safety;Standing   Upper Body Bathing: Supervision/ safety;Standing   Lower Body Bathing: Minimal assistance;Sit to/from stand   Upper Body Dressing : Set up;Sitting   Lower Body Dressing: Sit to/from stand;Supervision/safety   Toilet Transfer: Supervision/safety;Ambulation   Toileting- Clothing Manipulation and Hygiene: Supervision/safety;Sit to/from stand       Functional mobility during ADLs: Supervision/safety       Vision      Perception     Praxis      Pertinent Vitals/Pain Pain Assessment: No/denies pain     Hand Dominance Right   Extremity/Trunk Assessment Upper Extremity Assessment Upper Extremity Assessment: Overall WFL for tasks assessed   Lower Extremity Assessment Lower Extremity Assessment: Overall WFL for tasks assessed   Cervical / Trunk Assessment Cervical / Trunk Assessment: Normal   Communication Communication Communication: No difficulties   Cognition Arousal/Alertness: Awake/alert Behavior During Therapy:  (eager to go home, perseverative) Overall Cognitive Status: No family/caregiver present to determine baseline cognitive functioning Area of Impairment: Safety/judgement         Safety/Judgement: Decreased awareness of safety;Decreased awareness of deficits     General Comments: pt resistant, but agreeable to chair alarm   General Comments       Exercises       Shoulder Instructions      Home Living Family/patient expects to be discharged to:: Private residence Living Arrangements: Spouse/significant other;Children Available Help at Discharge: Family;Available 24 hours/day Type of Home: House Home Access: Stairs to enter [employer] of Steps: 3 Entrance Stairs-Rails: Can reach both;Left;Right Home Layout: One level     Bathroom Shower/Tub: Occupational psychologist: Handicapped height     Home Equipment: Cane - single point          Prior Functioning/Environment Level of Independence: Independent with assistive device(s)        Comments: Uses cane if walking  long distances    OT Diagnosis: Generalized weakness;Cognitive deficits   OT Problem List:     OT Treatment/Interventions:      OT Goals(Current goals can be found in the care plan section) Acute Rehab OT Goals Patient Stated Goal: determined to go home as soon as possible (specifically he says he will be home before Christmas)  OT Frequency:     Barriers to D/C:             Co-evaluation              End of Session Equipment Utilized During Treatment: Gait belt Nurse Communication: Mobility status  Activity Tolerance: Patient tolerated treatment well Patient left: in chair;with call bell/phone within reach;with chair alarm set   Time: 1134-1155 OT Time Calculation (min): 21 min Charges:  OT General Charges $OT Visit: 1 Procedure OT Evaluation $Initial OT Evaluation Tier I: 1 Procedure G-Codes:    Malka So 06/29/2015, 11:59 AM [phone]

## 2015-06-29 NOTE — Progress Notes (Signed)
Pt transferred to 6E30 from 3S. Pt denies pain, walks with walker stand by assist.  Pleasant. Family at bedside.

## 2015-06-29 NOTE — Progress Notes (Signed)
[facility] TEAM 1 - Stepdown/ICU TEAM PROGRESS NOTE  Adam Butler ZOX:[id number] DOB: 03-19-1956 DOA: 06/27/2015 PCP: [name], NP  Admit HPI / Brief Narrative: 59 yo M Hx of liver cirrhosis due to NASH, CAD S/P stent placement, GI bleeding, esophageal varices, rectal varices, hepatic encephalopathy, thrombocytopenia, PFO, hypertension, hyperlipidemia, diabetes mellitus, and GERD, who presented with altered mental status.  He become confused acutely, then his mental status continued to get progressively worse and until finally he became unresponsive. Wife stated the patient had been compliant w/ his medications including lactulose 30 g 3 times a day.   In ED, patient was found to have an ammonia on 297, WBC 7.1, temperature normal, slightly bradycardia, negative urinalysis, potassium 3.4, renal function okay, negative CT head for acute abnormalities.   HPI/Subjective: Ammonia has trended upward over the last 24hrs.  Pt is alert and conversant, but somewhat slow to respond to questions.  He denies cp, n/v, abom pain, or sob.  He is motivated to be d/c asap.    Assessment/Plan:  Hepatic encephalopathy  -Ammonia 297 on admit- improved initially, but climbing again over last 24hrs  -no sign of infection / SBP - CT head negative for acute abnormalities -restarted rifaximin 550 mg BID and lactulose 32m TID -watch overnight - do not feel would be wise to d/c home w/ climbing lactulose and mild sx of encephalopathy - if mental status further improved, and ammonia trending back down, may be a candidate for d/c in AM   HLD -Last LDL was 52 on 06/25/15  CAD s/p stent  -Aspirin daily  -cont nadolol and aldactone   Chronic Diastolic CHF  -2-D echo 181/1/91showed EF 55-60% -clinically well compensated at present   DM2 -12/4 A1c 6.8  -CBG currently well controlled   Tobacco abuse -Nicotine patch  Thrombocytopenia  -due to cirrhosis - appears to have stabilized at ~50 - follow  trend - no obvious bleeding presently   Liver cirrhosis secondary to NASH -resuming usual home maintenance meds   GERD -Protonix   Hypokalemia  -corrected w/ replacement - follow w/ ongoing use of lactulose   Hypomagnesemia -replace and recheck in AM   Code Status: FULL Family Communication: no family present at time of exam Disposition Plan: transfer to medical bed - f/u ammonia in AM - possible d/c next 24-48hrs so will keep on Team 1  Consultants: none  Procedures: none  Antibiotics: none  DVT prophylaxis: SCDs  Objective: Blood pressure 96/48, pulse 64, temperature 97.8 F (36.6 C), temperature source Oral, resp. rate 15, SpO2 99 %.  Intake/Output Summary (Last 24 hours) at 06/29/15 1451 Last data filed at 06/29/15 1300  Gross per 24 hour  Intake   1005 ml  Output    695 ml  Net    310 ml     Exam: General: No acute respiratory distress - alert but somewhat slow in processing conversation  Lungs: Clear to auscultation bilaterally without wheezes or crackles Cardiovascular: Regular rate and rhythm without murmur gallop or rub normal S1 and S2 Abdomen: Nontender, obese, soft, bowel sounds positive, no rebound, no ascites, no appreciable mass Extremities: No significant cyanosis, or clubbing; trace edema bilateral lower extremities  Data Reviewed: Basic Metabolic Panel:  Recent Labs Lab 06/27/15 0047 06/27/15 0522 06/29/15 0448  NA 138 139 137  K 3.4* 3.9 3.7  CL 102 106 107  CO2 26 24 21*  GLUCOSE 114* 131* 126*  BUN <5* 8 8  CREATININE 0.67 0.80 0.86  CALCIUM 8.5* 8.9 8.2*  MG  --   --  1.5*    CBC:  Recent Labs Lab 06/27/15 0047 06/27/15 0522 06/29/15 0448  WBC 7.1 3.1* 3.7*  NEUTROABS  --   --  1.7  HGB 13.2 11.5* 12.2*  HCT 39.1 33.7* 35.7*  MCV 99.5 100.0 101.7*  PLT 96* 53* 51*    Liver Function Tests:  Recent Labs Lab 06/27/15 0047 06/27/15 0522 06/29/15 0448  AST 84* 68* 63*  ALT 41 45 41  ALKPHOS 61 164* 85    BILITOT 1.3* 1.1 1.7*  PROT 7.2 6.4* 5.9*  ALBUMIN 4.0 2.6* 2.6*    Recent Labs Lab 06/27/15 0057 06/28/15 0402 06/29/15 0448  AMMONIA 297* 68* 87*    Coags:  Recent Labs Lab 06/27/15 0047  INR 1.18    Recent Labs Lab 06/27/15 0522  APTT 34   CBG:  Recent Labs Lab 06/28/15 1550 06/28/15 1923 06/28/15 1928 06/29/15 0749 06/29/15 1110  GLUCAP 242* 72 189* 138* 141*    Recent Results (from the past 240 hour(s))  Urine culture     Status: None   Collection Time: 06/27/15 12:56 AM  Result Value Ref Range Status   Specimen Description URINE, CATHETERIZED  Final   Special Requests NONE  Final   Culture 1,000 COLONIES/mL INSIGNIFICANT GROWTH  Final   Report Status 06/28/2015 FINAL  Final  Culture, blood (Routine X 2) w Reflex to ID Panel     Status: None (Preliminary result)   Collection Time: 06/27/15  1:34 AM  Result Value Ref Range Status   Specimen Description BLOOD RIGHT HAND  Final   Special Requests BOTTLES DRAWN AEROBIC AND ANAEROBIC 5ML  Final   Culture NO GROWTH 2 DAYS  Final   Report Status PENDING  Incomplete  Culture, blood (Routine X 2) w Reflex to ID Panel     Status: None (Preliminary result)   Collection Time: 06/27/15  1:37 AM  Result Value Ref Range Status   Specimen Description BLOOD LEFT HAND  Final   Special Requests BOTTLES DRAWN AEROBIC AND ANAEROBIC 5ML  Final   Culture NO GROWTH 2 DAYS  Final   Report Status PENDING  Incomplete     Studies:   Recent x-ray studies have been reviewed in detail by the Attending Physician  Scheduled Meds:  Scheduled Meds: . aspirin  325 mg Oral Daily  . furosemide  40 mg Oral Daily  . insulin aspart  0-9 Units Subcutaneous TID WC  . lactulose  45 g Oral TID  . metFORMIN  1,000 mg Oral BID WC  . nadolol  20 mg Oral Daily  . nicotine  21 mg Transdermal Daily  . pantoprazole  40 mg Oral Daily  . rifaximin  550 mg Oral BID  . sodium chloride  3 mL Intravenous Q12H  . spironolactone  12.5 mg  Oral Daily    Time spent on care of this patient: 35 mins   Berania Peedin T , MD   [facility] Office  [phone] Pager - Text Page per Shea Evans as per below:  On-Call/Text Page:      [email or web address]      password [email or web address]  If 7PM-7AM, please contact night-coverage [email or web address] Password [email or web address] 06/29/2015, 2:51 PM   LOS: 2 days

## 2015-06-29 NOTE — Progress Notes (Signed)
Report called to Otila Kluver, Therapist, sports for [employer].

## 2015-06-29 NOTE — Consult Note (Signed)
   [facility] CM Inpatient Consult   06/29/2015  MYLIK PRO 1955/08/16 [id number] Patient is currently active with [facility] for chronic disease management services.  Patient has been engaged by a [employer].  Our community based plan of care has focused on disease management and community resource support.  Patient will receive a post discharge transition of care call and will be evaluated for monthly home visits for assessments and disease process education.  Made Inpatient Case Manager aware that [facility] will continue to follow, patient will likely discharge back home today or tomorrow per inpatient care manager. Of note, [facility] Care Management services does not replace or interfere with any services that are needed or arranged by inpatient case management or social work.  For additional questions or referrals please contact: Natividad Brood, RN BSN [facility] Liaison  [phone] business mobile phone

## 2015-06-29 NOTE — Care Management Note (Signed)
Case Management Note  Patient Details  Name: Adam Butler MRN: [id number] Date of Birth: 1955-09-21  Subjective/Objective:                 Admitted with AMS,  PMHx of liver cirrhosis due to NASH, CAD, S/P stent placement, GI bleeding, esophageal varices, rectal varices, hepatic encephalopathy, thrombocytopenia, PFO, hypertension, hyperlipidemia, diabetes mellitus, GERD. From home with wife/family. Independent with ADL's pta. DME; cane.   Action/Plan: Return to home when medically stable. CM to f/u with disposition needs.  Expected Discharge Date:                  Expected Discharge Plan:  Home/Self Care  In-House Referral:     Discharge planning Services  CM Consult  Post Acute Care Choice:    Choice offered to:     DME Arranged:    DME Agency:     HH Arranged:    HH Agency:     Status of Service:  In process, will continue to follow  Medicare Important Message Given:    Date Medicare IM Given:    Medicare IM give by:    Date Additional Medicare IM Given:    Additional Medicare Important Message give by:     If discussed at [place] of Stay Meetings, dates discussed:    Additional Comments: CM spoke with pt regarding discharge planning. Pt states he can take care of self once d/c and wife will assist him if needed.  Nigel Ericsson (Spouse)  [phone]  Whitman Hero [place], [place] [phone] 06/29/2015, 9:54 AM

## 2015-06-30 DIAGNOSIS — E1121 Type 2 diabetes mellitus with diabetic nephropathy: Secondary | ICD-10-CM

## 2015-06-30 LAB — COMPREHENSIVE METABOLIC PANEL
ALT: 47 U/L (ref 17–63)
ANION GAP: 10 (ref 5–15)
AST: 73 U/L — ABNORMAL HIGH (ref 15–41)
Albumin: 2.8 g/dL — ABNORMAL LOW (ref 3.5–5.0)
Alkaline Phosphatase: 102 U/L (ref 38–126)
BILIRUBIN TOTAL: 1.9 mg/dL — AB (ref 0.3–1.2)
BUN: 8 mg/dL (ref 6–20)
CO2: 22 mmol/L (ref 22–32)
Calcium: 8.9 mg/dL (ref 8.9–10.3)
Chloride: 103 mmol/L (ref 101–111)
Creatinine, Ser: 0.76 mg/dL (ref 0.61–1.24)
GFR calc Af Amer: >60 mL/min (ref >60)
Glucose, Bld: 119 mg/dL — ABNORMAL HIGH (ref 65–99)
POTASSIUM: 3.9 mmol/L (ref 3.5–5.1)
Sodium: 135 mmol/L (ref 135–145)
TOTAL PROTEIN: 6.4 g/dL — AB (ref 6.5–8.1)

## 2015-06-30 LAB — CBC
HEMATOCRIT: 35.5 % — AB (ref 39.0–52.0)
HEMOGLOBIN: 12.4 g/dL — AB (ref 13.0–17.0)
MCH: 35 pg — ABNORMAL HIGH (ref 26.0–34.0)
MCHC: 34.9 g/dL (ref 30.0–36.0)
MCV: 100.3 fL — ABNORMAL HIGH (ref 78.0–100.0)
Platelets: 64 10*3/uL — ABNORMAL LOW (ref 150–400)
RBC: 3.54 MIL/uL — AB (ref 4.22–5.81)
RDW: 14 % (ref 11.5–15.5)
WBC: 4.3 10*3/uL (ref 4.0–10.5)

## 2015-06-30 LAB — AMMONIA: Ammonia: 74 umol/L — ABNORMAL HIGH (ref 9–35)

## 2015-06-30 LAB — GLUCOSE, CAPILLARY
GLUCOSE-CAPILLARY: 109 mg/dL — AB (ref 65–99)
GLUCOSE-CAPILLARY: 118 mg/dL — AB (ref 65–99)

## 2015-06-30 LAB — MAGNESIUM: MAGNESIUM: 1.6 mg/dL — AB (ref 1.7–2.4)

## 2015-06-30 MED ORDER — NICOTINE 21 MG/24HR TD PT24: 21.0000 mg | MEDICATED_PATCH | Freq: Every day | TRANSDERMAL | Status: DC

## 2015-06-30 MED ORDER — SPIRONOLACTONE 25 MG PO TABS: 12.5000 mg | ORAL_TABLET | Freq: Every day | ORAL | Status: DC

## 2015-06-30 MED ORDER — NADOLOL 40 MG PO TABS: 20.0000 mg | ORAL_TABLET | Freq: Every day | ORAL | Status: DC

## 2015-06-30 MED ORDER — LACTULOSE 10 GM/15ML PO SOLN: 30.0000 g | Freq: Three times a day (TID) | ORAL | Status: DC

## 2015-06-30 NOTE — Consult Note (Signed)
   [facility] CM Inpatient Consult   06/30/2015  Adam Butler Jan 21, 1956 [id number] Received a voicemail message from Adam Butler RNCM, Adam Butler,  regarding patient's [facility] follow up.   Patient was ambulating in the halls with his walker and states, "I just walked 78 steps, who may I say, is looking for me."  This Probation officer introduced self with [facility]. Spoke with the patient regarding ongoing care management needs with [facility] community team.  Patient endorses that he would like to continue with the services.  Stating, "I have a better plan for myself when I get out of here.  I am planning to take my Lactulose at least 3 times a day, even if I have to wear my pull ups at night.  I am also going to speak with my primary care [facility], NP] about possibly changing the way I am taking my insulin.  I have my Novalin and Metformin, that I feel needs some adjustments."  Patient states he does not like admitting to the [facility]. "I know I need to do something to keep my ... out of here [excuse my [place], laughingly]."   Spoke with the patient regarding needs for symptom management for pain or disease management in regards to Palliative Care.  Patient states, "That nurse from the [facility] told me that I was dying and she made me and my wife mad and told me I shouldn't be driving. I felt like she was trying to 'make' me give up.   That really made me mad. I ended up back here in the [facility] and the other night my cousin, came and prayed for me, he has healing hands.  This is the second time he has prayed for me; and the first time his healing hands  was when they had called my family in, he prayed and I woke up the next morning alert and doing good.  I don't feel like it's my time and I don't want anybody telling me that when only God has the final say on that."  Patient said he was open to talk more about the Palliative care issue but not about Hospice. He  states, "My mother died at home with Hospice and not long after that my sister died in the [facility].  So, I really don't like coming to the [facility], I get scared, really and now I know I have to change some things I was doing to keep myself out of here."  Patient states again, "I am open to talking about that Palliative stuff later but, right now I have to get ready to go home, the doctor should have everything ready at 62 and my wife is on the way."  Thanked the patient for his time and he thanked this Probation officer for stopping by and gently closed the door endorses he will talk to the nurse [[facility]] and his primary about ongoing needs.  Contact information given. Inpatient RNCM made aware of [facility] Care Management involvement.  Of note, [facility] Care Management does not replace or interfere with any services needed or arranged by the inpatient care management staff.  For questions, please contact:  Natividad Brood, RN BSN [facility] Liaison  [phone] business mobile phone

## 2015-06-30 NOTE — Progress Notes (Signed)
Patient refused his Lactulose and Nicotine patch.Patient said " no point of taking lactulose if i'm going to pooh in my car.I have that medicines at homes and ill take it when i get myself home.[place] papers printed,explained and given to patient and his wife.Reminded of patient of his medications especially his lactulose.Questions were answered satisfactorily at the the time of discharged.

## 2015-06-30 NOTE — Discharge Summary (Signed)
Physician Discharge Summary  Adam Butler:[id number] DOB: 23-Oct-1955 DOA: 06/27/2015  PCP: [name], NP  Admit date: 06/27/2015 Discharge date: 06/30/2015  Time spent: 40 minutes  Recommendations for Outpatient Follow-up:   Hepatic encephalopathy [facility]):  -Patient's altered mental status is most likely due to hepatic encephalopathy. Ammonia on admission= 297  -No signs of infection or SBP. CT head is negative for acute abnormalities.  -Continue Lactulose 45 g TID -blood culture -Continue Rifaximin 550 mg BID -On discharge NH4= 74, (patient mentating WNL)  -Schedule Follow-up with Dr.Jay M Pyrtle GI within 2 weeks  HLD:  -Last LDL was 52 on 06/25/15 -Hold oral home medications: Lipitor  CAD native artery:  -s/p of stent. -Aspirin 325 mg daily  -Nadolol at 1/2 home dose 20 mg daily; GI/PCP to titrate  -Spironolactone at 1/2 home dose 12.5 mg daily GI/PCP to titrate -Schedule appointment for follow-up NP [name] hepatic encephalopathy, liver cirrhosis CAD native artery,  DIASTOLIC HEART FAILURE, CHRONIC:  -Lasix 40 mg daily -2-D echo 04/17/15 showed EF 55-60%. Patient is on Lasix and  -See CAD and hepatic encephalopathy  DM-II: Controlled  -12/4 hemoglobin A1c =6.8 on 06/14/15, -Metformin 1000 mg BID -Hold patient's home NPH; PCP to restart if required   Tobacco abuse: -Nicotine patch  Thrombocytopenia ([place]):  -Likely due to cirrhosis. Previously to 96, no bleeding tendency -Follow-up as CBC  Liver cirrhosis secondary to NASH:   GERD: -Protonix 40 mg daily  Hypokalemia:  -WNL. PCP to monitor     Discharge Diagnoses:  Principal Problem:   Hepatic encephalopathy ([place]) Active Problems:   HLD (hyperlipidemia)   CAD, NATIVE VESSEL   DIASTOLIC HEART FAILURE, CHRONIC   Esophageal varices (HCC)   Type 2 diabetes mellitus, uncontrolled (HCC)   PFO (patent foramen ovale): Per TEE 04/20/2015   Tobacco abuse   Thrombocytopenia (HCC)  Liver cirrhosis secondary to NASH   Altered mental status   CAD in native artery   Controlled type 2 diabetes mellitus with stage 2 chronic kidney disease, without long-term current use of insulin (HCC)   Hypokalemia   Controlled type 2 diabetes mellitus with diabetic nephropathy, without long-term current use of insulin ([place])   Discharge Condition: Stable  Diet recommendation: Heart healthy/liver failure  There were no vitals filed for this visit.  History of present illness:  59 y.o. WM PMHx of liver cirrhosis due to NASH, CAD, S/P stent placement, GI bleeding, esophageal varices, rectal varices, hepatic encephalopathy, thrombocytopenia, PFO, hypertension, hyperlipidemia, diabetes mellitus, GERD, who presents with altered mental status.  Per patient's wife, patient has been in his normal baseline health until tonight when he become confused, which has been progressively getting worse and finally he becomes unresponsive. He did not complain abdominal pain, fever, chills, chest pain, shortness of breath, cough, unilateral weakness to his wife before he became confused. Wife states that the patient has been compliant to his home medications including lactulose 30 g 3 times a day. He usually has good bowel movements, but not today.   In ED, patient was found to have ammonia 297, WBC 7.1, temperature normal, slightly bradycardia, negative urinalysis, potassium 3.4, renal function okay, negative CT head for acute abnormalities. Pending chest x-ray. Patient is admitted to inpatient for further evaluation treatment. During his hospitalization patient was treated for hepatic encephalopathy. This are brought on most likely secondary to patient underestimating amount of lactulose required to maintain ammonia level at adequate level to prevent encephalopathy. After patient's ammonia level decreased from its high  of 297 patient's mental status improved to baseline.    Procedures: 12/17 CT head without  contrast; negative acute abnormalities     Discharge Exam: Filed Vitals:   06/29/15 1118 06/29/15 1619 06/29/15 1955 06/30/15 0412  BP: 96/48 97/56 132/68 129/60  Pulse: 64 66 67 72  Temp: 97.8 F (36.6 C) 97.9 F (36.6 C) 98.6 F (37 C) 98.5 F (36.9 C)  TempSrc: Oral Oral Oral Oral  Resp:  18 19 20   SpO2:   100% 100%    General: A/O 4, NAD, No acute respiratory distress Eyes: Negative headache,negative scleral hemorrhage ENT: Negative Runny nose, negative ear pain, negative gingival bleeding, Neck: Negative scars, masses, torticollis, lymphadenopathy, JVD Lungs: Clear to auscultation bilaterally without wheezes or crackles Cardiovascular: Regular rate and rhythm without murmur gallop or rub normal S1 and S2 Abdomen:negative abdominal pain, morbidly obese, positive soft, bowel sounds, no rebound, no ascites, no appreciable mass  Discharge Instructions     Medication List    STOP taking these medications        atorvastatin 10 MG tablet  Commonly known as:  LIPITOR     insulin NPH Human 100 UNIT/ML injection  Commonly known as:  HUMULIN N,NOVOLIN N     ranolazine 500 MG 12 hr tablet  Commonly known as:  RANEXA      TAKE these medications        aspirin 81 MG tablet  Take 81 mg by mouth daily.     furosemide 40 MG tablet  Commonly known as:  LASIX  Take 40 mg by mouth daily.     lactulose 10 GM/15ML solution  Commonly known as:  CHRONULAC  Take 45 mLs (30 g total) by mouth 3 (three) times daily. May take an additional dose as needed for bowel movement     metFORMIN 1000 MG tablet  Commonly known as:  GLUCOPHAGE  Take 1,000 mg by mouth 2 (two) times daily.     multivitamin capsule  Take 1 capsule by mouth daily.     nadolol 40 MG tablet  Commonly known as:  CORGARD  Take 0.5 tablets (20 mg total) by mouth daily.     nicotine 21 mg/24hr patch  Commonly known as:  NICODERM CQ - dosed in mg/24 hours  Place 1 patch (21 mg total) onto the skin  daily.     nitroGLYCERIN 0.4 MG SL tablet  Commonly known as:  NITROSTAT  Place 0.4 mg under the tongue every 5 (five) minutes as needed for chest pain (chest pain).     omeprazole 20 MG capsule  Commonly known as:  PRILOSEC  Take 1 capsule (20 mg total) by mouth daily.     rifaximin 550 MG Tabs tablet  Commonly known as:  XIFAXAN  Take 1 tablet (550 mg total) by mouth 2 (two) times daily.     spironolactone 25 MG tablet  Commonly known as:  ALDACTONE  Take 0.5 tablets (12.5 mg total) by mouth daily.       Allergies  Allergen Reactions  . Isosorbide Other (See Comments)    Nose bleeds  . Lactose Intolerance (Gi) Other (See Comments)   Follow-up Information    Follow up with PYRTLE, Lajuan Lines, MD. Schedule an appointment as soon as possible for a visit in 1 week.   Specialty:  Gastroenterology   Why:  -Schedule Follow-up with Dr.Jay M Pyrtle GI within 2 weeks   Contact information:   [address] [place] [address]  [phone]       Follow up with [name], NP. Schedule an appointment as soon as possible for a visit in 2 weeks.   Specialty:  Internal Medicine   Why:  Schedule appointment for follow-up NP [name] hepatic encephalopathy, liver cirrhosis CAD native artery, diastolic CHF   Contact information:   [place] [address] [phone]        The results of significant diagnostics from this hospitalization (including imaging, microbiology, ancillary and laboratory) are listed below for reference.    Significant Diagnostic Studies: Dg Chest 2 View  06/05/2015  CLINICAL DATA:  Altered mental status and cough for 1 day. EXAM: CHEST  2 VIEW COMPARISON:  Radiograph 05/15/2015 FINDINGS: Normal cardiac silhouette. Small basilar pleural effusions. No pulmonary edema. No pneumothorax. No focal infiltrate. IMPRESSION: Small pleural effusions. Electronically Signed   By: Suzy Bouchard M.D.   On: 06/05/2015 17:24   Ct Head Wo  Contrast  06/27/2015  CLINICAL DATA:  Altered mental status. Patient is currently nonverbal but withdrawal is from pain. History of esophageal varices, cirrhosis, diabetes, COPD, hypertension. EXAM: CT HEAD WITHOUT CONTRAST TECHNIQUE: Contiguous axial images were obtained from the base of the skull through the vertex without intravenous contrast. COMPARISON:  06/14/2015 FINDINGS: Mild cerebral atrophy. No ventricular dilatation. Patchy low-attenuation changes in the deep white matter are nonspecific but likely represent small vessel ischemic change. Other white matter disease not excluded. No significant change since previous study. No mass effect or midline shift. No abnormal extra-axial fluid collections. Gray-white matter junctions are distinct. Basal cisterns are not effaced. No evidence of acute intracranial hemorrhage. No depressed skull fractures. Visualized paranasal sinuses and mastoid air cells are not opacified. IMPRESSION: No acute intracranial abnormalities. Mild chronic atrophy and white matter disease. Electronically Signed   By: Lucienne Capers M.D.   On: 06/27/2015 01:26   Ct Head Wo Contrast  06/14/2015  CLINICAL DATA:  History of cirrhosis, unable to wake up this morning or ambulate for Mariel Kansky. EXAM: CT HEAD WITHOUT CONTRAST TECHNIQUE: Contiguous axial images were obtained from the base of the skull through the vertex without intravenous contrast. COMPARISON:  Head CTs dated 05/07/2015 and 04/14/2015. FINDINGS: Again noted is mild generalized brain atrophy with commensurate dilatation of the ventricles and sulci. Patchy areas of low density within the bilateral periventricular and subcortical white matter regions are stable and most compatible with sequela of chronic small vessel ischemia. There is no mass, hemorrhage, edema, or other evidence of acute parenchymal abnormality. No extra-axial hemorrhage. There are chronic calcified atherosclerotic changes of the large vessels at the skull  base. Osseous structures are unremarkable. Mild mucosal thickening noted within the right sphenoid sinus. Paranasal sinuses otherwise clear. Mastoid air cells are clear. Superficial soft tissues are unremarkable. IMPRESSION: 1. Mild atrophy and chronic ischemic changes in the white matter. 2. NO evidence of acute intracranial abnormality. NO intracranial mass, hemorrhage, or edema. 3. Mild sphenoid sinus disease, new compared to the previous exam. Electronically Signed   By: Franki Cabot M.D.   On: 06/14/2015 10:42   Dg Chest Port 1 View  06/27/2015  CLINICAL DATA:  Altered mental status. EXAM: PORTABLE CHEST 1 VIEW COMPARISON:  06/14/2015 FINDINGS: Low lung volumes, similar to prior exam. Cardiomediastinal contours are unchanged. Mild left basilar atelectasis. No pulmonary edema, confluent airspace disease, large pleural effusion or pneumothorax. No acute osseous abnormalities. Remote mid left clavicle fracture again seen. IMPRESSION: Hypoventilatory chest with mild left basilar atelectasis. Electronically Signed  By: Jeb [place]ing M.D.   On: 06/27/2015 03:20   Dg Chest Portable 1 View  06/14/2015  CLINICAL DATA:  Altered mental status this morning. EXAM: PORTABLE CHEST 1 VIEW COMPARISON:  Chest x-rays dated 06/05/2015 and 05/07/2015. FINDINGS: Study is hypoinspiratory with crowding of the perihilar and bibasilar bronchovascular markings. Given the low lung volumes, lungs are likely clear. No confluent opacity to suggest developing pneumonia. No pleural effusion seen. Cardiomediastinal silhouette appears stable in size and configuration. Osseous and soft tissue structures about the chest are unremarkable. IMPRESSION: Hypoinspiratory changes as above. Given the low lung volumes, no evidence of acute cardiopulmonary abnormality. Electronically Signed   By: Franki Cabot M.D.   On: 06/14/2015 10:54   Dg Abd Portable 1v  06/27/2015  CLINICAL DATA:  Feeding tube placement. EXAM: PORTABLE ABDOMEN - 1 VIEW  COMPARISON:  None. FINDINGS: A small bore feeding tube is identified with tip overlying the proximal-mid duodenum. The bowel gas pattern is unremarkable. IMPRESSION: Small bore feeding tube with tip overlying the proximal -mid duodenum. Electronically Signed   By: Margarette Canada M.D.   On: 06/27/2015 15:08    Microbiology: Recent Results (from the past 240 hour(s))  Urine culture     Status: None   Collection Time: 06/27/15 12:56 AM  Result Value Ref Range Status   Specimen Description URINE, CATHETERIZED  Final   Special Requests NONE  Final   Culture 1,000 COLONIES/mL INSIGNIFICANT GROWTH  Final   Report Status 06/28/2015 FINAL  Final  Culture, blood (Routine X 2) w Reflex to ID Panel     Status: None (Preliminary result)   Collection Time: 06/27/15  1:34 AM  Result Value Ref Range Status   Specimen Description BLOOD RIGHT HAND  Final   Special Requests BOTTLES DRAWN AEROBIC AND ANAEROBIC 5ML  Final   Culture NO GROWTH 2 DAYS  Final   Report Status PENDING  Incomplete  Culture, blood (Routine X 2) w Reflex to ID Panel     Status: None (Preliminary result)   Collection Time: 06/27/15  1:37 AM  Result Value Ref Range Status   Specimen Description BLOOD LEFT HAND  Final   Special Requests BOTTLES DRAWN AEROBIC AND ANAEROBIC 5ML  Final   Culture NO GROWTH 2 DAYS  Final   Report Status PENDING  Incomplete     Labs: Basic Metabolic Panel:  Recent Labs Lab 06/27/15 0047 06/27/15 0522 06/29/15 0448 06/30/15 0713  NA 138 139 137 135  K 3.4* 3.9 3.7 3.9  CL 102 106 107 103  CO2 26 24 21* 22  GLUCOSE 114* 131* 126* 119*  BUN <5* 8 8 8   CREATININE 0.67 0.80 0.86 0.76  CALCIUM 8.5* 8.9 8.2* 8.9  MG  --   --  1.5* 1.6*   Liver Function Tests:  Recent Labs Lab 06/27/15 0047 06/27/15 0522 06/29/15 0448 06/30/15 0713  AST 84* 68* 63* 73*  ALT 41 45 41 47  ALKPHOS 61 164* 85 102  BILITOT 1.3* 1.1 1.7* 1.9*  PROT 7.2 6.4* 5.9* 6.4*  ALBUMIN 4.0 2.6* 2.6* 2.8*   No results  for input(s): LIPASE, AMYLASE in the last 168 hours.  Recent Labs Lab 06/27/15 0057 06/28/15 0402 06/29/15 0448 06/30/15 0713  AMMONIA 297* 68* 87* 74*   CBC:  Recent Labs Lab 06/27/15 0047 06/27/15 0522 06/29/15 0448 06/30/15 0713  WBC 7.1 3.1* 3.7* 4.3  NEUTROABS  --   --  1.7  --   HGB 13.2 11.5* 12.2* 12.4*  HCT 39.1 33.7* 35.7* 35.5*  MCV 99.5 100.0 101.7* 100.3*  PLT 96* 53* 51* 64*   Cardiac Enzymes: No results for input(s): CKTOTAL, CKMB, CKMBINDEX, TROPONINI in the last 168 hours. BNP: BNP (last 3 results)  Recent Labs  10/14/14 2248 06/05/15 2325 06/27/15 0520  BNP 56.1 37.5 35.3    ProBNP (last 3 results)  Recent Labs  12/11/14 0913  PROBNP 38.0    CBG:  Recent Labs Lab 06/29/15 1110 06/29/15 1617 06/29/15 1645 06/29/15 1951 06/30/15 0750  GLUCAP 141* 172* 156* 147* 109*       Signed:  Dia Crawford, MD [facility] [phone] pager

## 2015-06-30 NOTE — Care Management Important Message (Signed)
Important Message  Patient Details  Name: Adam Butler MRN: [id number] Date of Birth: 10/11/1955   Medicare Important Message Given:  Yes    Stana Bayon P Leanora Murin 06/30/2015, 1:02 PM

## 2015-07-01 ENCOUNTER — Other Ambulatory Visit: Payer: Self-pay | Admitting: *Deleted
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
## 2015-07-02 LAB — CULTURE, BLOOD (ROUTINE X 2)
CULTURE: NO GROWTH
CULTURE: NO GROWTH

## 2015-07-09 ENCOUNTER — Ambulatory Visit (INDEPENDENT_AMBULATORY_CARE_PROVIDER_SITE_OTHER): Payer: PPO | Admitting: Internal Medicine

## 2015-07-09 ENCOUNTER — Other Ambulatory Visit: Payer: Self-pay | Admitting: *Deleted

## 2015-07-09 ENCOUNTER — Encounter: Payer: Self-pay | Admitting: Internal Medicine

## 2015-07-09 VITALS — BP 124/82 | HR 66 | Temp 97.7°F | Wt 266.0 lb

## 2015-07-09 DIAGNOSIS — K7682 Hepatic encephalopathy: Secondary | ICD-10-CM

## 2015-07-09 DIAGNOSIS — I2510 Atherosclerotic heart disease of native coronary artery without angina pectoris: Secondary | ICD-10-CM | POA: Diagnosis not present

## 2015-07-09 DIAGNOSIS — K7290 Hepatic failure, unspecified without coma: Secondary | ICD-10-CM | POA: Diagnosis not present

## 2015-07-09 DIAGNOSIS — Z794 Long term (current) use of insulin: Secondary | ICD-10-CM

## 2015-07-09 DIAGNOSIS — E785 Hyperlipidemia, unspecified: Secondary | ICD-10-CM | POA: Diagnosis not present

## 2015-07-09 DIAGNOSIS — E119 Type 2 diabetes mellitus without complications: Secondary | ICD-10-CM

## 2015-07-09 DIAGNOSIS — I509 Heart failure, unspecified: Secondary | ICD-10-CM

## 2015-07-09 LAB — CBC
HCT: 37.8 % — ABNORMAL LOW (ref 39.0–52.0)
Hemoglobin: 12.6 g/dL — ABNORMAL LOW (ref 13.0–17.0)
MCHC: 33.3 g/dL (ref 30.0–36.0)
MCV: 101.1 fl — ABNORMAL HIGH (ref 78.0–100.0)
Platelets: 90 10*3/uL — ABNORMAL LOW (ref 150.0–400.0)
RBC: 3.74 Mil/uL — AB (ref 4.22–5.81)
RDW: 14.8 % (ref 11.5–15.5)
WBC: 5.2 10*3/uL (ref 4.0–10.5)

## 2015-07-09 LAB — MAGNESIUM: Magnesium: 1.5 mg/dL (ref 1.5–2.5)

## 2015-07-09 LAB — COMPREHENSIVE METABOLIC PANEL
ALBUMIN: 3 g/dL — AB (ref 3.5–5.2)
ALK PHOS: 139 U/L — AB (ref 39–117)
ALT: 44 U/L (ref 0–53)
AST: 62 U/L — ABNORMAL HIGH (ref 0–37)
BUN: 9 mg/dL (ref 6–23)
CALCIUM: 9.3 mg/dL (ref 8.4–10.5)
CO2: 26 mEq/L (ref 19–32)
CREATININE: 0.86 mg/dL (ref 0.40–1.50)
Chloride: 102 mEq/L (ref 96–112)
GFR: 96.63 mL/min (ref >60.00)
Glucose, Bld: 202 mg/dL — ABNORMAL HIGH (ref 70–99)
POTASSIUM: 3.9 meq/L (ref 3.5–5.1)
SODIUM: 137 meq/L (ref 135–145)
TOTAL PROTEIN: 7 g/dL (ref 6.0–8.3)
Total Bilirubin: 1.2 mg/dL (ref 0.2–1.2)

## 2015-07-09 LAB — AMMONIA: AMMONIA: 98 umol/L — AB (ref 16–53)

## 2015-07-09 MED ORDER — RANOLAZINE ER 500 MG PO TB12: 500.0000 mg | ORAL_TABLET | Freq: Two times a day (BID) | ORAL | Status: DC

## 2015-07-09 MED ORDER — ATORVASTATIN CALCIUM 10 MG PO TABS: 10.0000 mg | ORAL_TABLET | Freq: Every day | ORAL | Status: DC

## 2015-07-09 MED ORDER — INSULIN REGULAR HUMAN 100 UNIT/ML IJ SOLN: INTRAMUSCULAR | Status: DC

## 2015-07-09 NOTE — Progress Notes (Signed)
Pre visit review using our clinic review tool, if applicable. No additional management support is needed unless otherwise documented below in the visit note. 

## 2015-07-09 NOTE — Patient Outreach (Signed)
Transition of care call #2  Pt saw his primary care provider today. She reinforced his discharge medication orders. He will see his GI specialist on Jan 3rd. I will see him at his home on Thursday, Jan. 5th. Encouraged pt to eat the low protein diet and take his meds as ordered, call me if any problems arise or if he has any questions.  [name] [facility] [place] [phone]

## 2015-07-09 NOTE — Progress Notes (Signed)
Subjective:    Patient ID: Adam Butler, male    DOB: 05-Mar-1956, 59 y.o.   MRN: [id number]  HPI  Pt presents to the clinic today for hospital follow up for hepatic encephalopathy. He presented to the ER 12/17 for AMS. Ammonia was 297. CT head was negative. He was continue on Xifaxin and Lactulose. They did decrease his Nadolol and Spironolactone. A1C ws 6.8%. They discontinued his Novolin but he reports he was not aware of this, so he has continue to take it as well as his Metformin. His fasting sugar this morning was 4. He reports since this admission, he has been feeling well. He has been taking an extra dose of Lactulose every night to ensure he is not retaining ammonia. He has started to wear an adult diaper in case of accidents but he reports this does not bother him. He has an appt with Dr. Hilarie Fredrickson next week.  Review of Systems      Past Medical History  Diagnosis Date  . Cirrhosis ([place]) 2011    Cryptogenic, Likely NASH. Family/pt deny EtOH. HCV, HBV, HAV negative. ANA negative. AMA positive. Ascites 12/11  . Hyperlipemia   . Coronary artery disease     Inferior MI 12/11; LHC with occluded mid CFX and 80% proximal RCA. EF 55%. He had 3.0 x 28 vision BMS to CFX  . Diastolic CHF, acute ([place])     Echo 12/11 with ef 50-55% and mild LVH. EF 55% by LV0gram in 12/11  . Hypertension   . Type II diabetes mellitus ([place]) 2011  . SVT (supraventricular tachycardia) ([place])     1/12: appeared to be an ectopic atrial tachycardia. Required DCCV with hemodynamic instability  . GI bleed     12/11: Etiology not clearly defined. EGD: nonbleeding esophageal varices.  . S/P coronary artery stent placement 06/2010  . Esophageal varices ([place]) 2011, 2013    no hx acute variceal bleed  . Hepatic encephalopathy ([place]) 2011, 12/2013  . Myocardial infarction [facility]) ? 2012  . Shortness of breath dyspnea   . Arthritis   . Hx of echocardiogram     Echo 5/16:  EF 55-60%, no RWMA, mod LAE  .  Thrombocytopenia ([place])   . Cholelithiasis   . Rectal varices   . PFO (patent foramen ovale): Per TEE 04/20/2015 04/20/2015  . Bacteremia   . Portal hypertension ([place])   . Rectal varices     Current Outpatient Prescriptions  Medication Sig Dispense Refill  . aspirin 81 MG tablet Take 81 mg by mouth daily.     . furosemide (LASIX) 40 MG tablet Take 40 mg by mouth daily.     Marland Kitchen lactulose (CHRONULAC) 10 GM/15ML solution Take 45 mLs (30 g total) by mouth 3 (three) times daily. May take an additional dose as needed for bowel movement 1350 mL 2  . metFORMIN (GLUCOPHAGE) 1000 MG tablet Take 1,000 mg by mouth 2 (two) times daily.  11  . Multiple Vitamin (MULTIVITAMIN) capsule Take 1 capsule by mouth daily.      . nadolol (CORGARD) 40 MG tablet Take 0.5 tablets (20 mg total) by mouth daily. 30 tablet 5  . nitroGLYCERIN (NITROSTAT) 0.4 MG SL tablet Place 0.4 mg under the tongue every 5 (five) minutes as needed for chest pain (chest pain).    Marland Kitchen omeprazole (PRILOSEC) 20 MG capsule Take 1 capsule (20 mg total) by mouth daily. 30 capsule 6  . rifaximin (XIFAXAN) 550 MG TABS tablet Take 1 tablet (  550 mg total) by mouth 2 (two) times daily. 24 tablet 0  . spironolactone (ALDACTONE) 25 MG tablet Take 0.5 tablets (12.5 mg total) by mouth daily. 30 tablet 11  . atorvastatin (LIPITOR) 10 MG tablet Take 1 tablet (10 mg total) by mouth daily. 30 tablet 2  . insulin regular (NOVOLIN R,HUMULIN R) 100 units/mL injection According to sliding scale: 151-200: 2 units, 201-250 4 units, 251-300 6 units, 301-350 8 units. If > 400 call me. 10 mL 11  . ranolazine (RANEXA) 500 MG 12 hr tablet Take 1 tablet (500 mg total) by mouth 2 (two) times daily. 60 tablet 2   No current facility-administered medications for this visit.    Allergies  Allergen Reactions  . Isosorbide Other (See Comments)    Nose bleeds  . Lactose Intolerance (Gi) Other (See Comments)    Family History  Problem Relation Age of Onset  . Heart  attack Brother 39    MI  . Diabetes Brother   . Heart attack Father   . Diabetes Father   . COPD Father   . COPD Mother   . Stroke Neg Hx   . Heart attack Brother     Social History   Social History  . Marital Status: Married    Spouse Name: N/A  . Number of Children: 3  . Years of Education: N/A   Occupational History  . Unemployed Other    Worked in maintenance prior   Social History Main Topics  . Smoking status: Current Every Day Smoker -- 1.00 packs/day for 36 years    Types: E-cigarettes  . Smokeless tobacco: Never Used     Comment: uses vapor cigarettes (2016 )  . Alcohol Use: No  . Drug Use: No  . Sexual Activity: No   Other Topics Concern  . Not on file   Social History Narrative   Married   Gets regular exercise: walking     Constitutional: Pt reports fatigue. Denies fever, malaise, headache or abrupt weight changes.  Respiratory: Denies difficulty breathing, shortness of breath, cough or sputum production.   Cardiovascular: Denies chest pain, chest tightness, palpitations or swelling in the hands or feet.  Gastrointestinal: Pt reports diarrhea. Denies abdominal pain, bloating, constipation, or blood in the stool.  Neurological: Denies dizziness, difficulty with memory, difficulty with speech or problems with balance and coordination.    No other specific complaints in a complete review of systems (except as listed in HPI above).  Objective:   Physical Exam   BP 124/82 mmHg  Pulse 66  Temp(Src) 97.7 F (36.5 C) (Oral)  Wt 266 lb (120.657 kg)  SpO2 97% Wt Readings from Last 3 Encounters:  07/09/15 266 lb (120.657 kg)  06/25/15 271 lb (122.925 kg)  06/25/15 264 lb (119.75 kg)    General: Appears his stated age, obese in NAD. Cardiovascular: Normal rate and rhythm. S1,S2 noted.  No murmur, rubs or gallops noted.  Pulmonary/Chest: Normal effort and positive vesicular breath sounds. No respiratory distress. No wheezes, rales or ronchi noted.    Abdomen: Soft and nontender. Normal bowel sounds. Neurological: Alert and oriented.  Psychiatric: He is in a very good mood today, laughing and smiling.  BMET    Component Value Date/Time   NA 135 06/30/2015 0713   K 3.9 06/30/2015 0713   CL 103 06/30/2015 0713   CO2 22 06/30/2015 0713   GLUCOSE 119* 06/30/2015 0713   BUN 8 06/30/2015 0713   CREATININE 0.76 06/30/2015 0737  CREATININE 0.74 03/15/2011 0827   CALCIUM 8.9 06/30/2015 0713   GFRNONAA >60 06/30/2015 0713   GFRAA >60 06/30/2015 0713    Lipid Panel     Component Value Date/Time   CHOL 117* 06/25/2015 1333   TRIG 134 06/25/2015 1333   HDL 38* 06/25/2015 1333   CHOLHDL 3.1 06/25/2015 1333   VLDL 27 06/25/2015 1333   LDLCALC 52 06/25/2015 1333    CBC    Component Value Date/Time   WBC 4.3 06/30/2015 0713   RBC 3.54* 06/30/2015 0713   HGB 12.4* 06/30/2015 0713   HCT 35.5* 06/30/2015 0713   PLT 64* 06/30/2015 0713   MCV 100.3* 06/30/2015 0713   MCH 35.0* 06/30/2015 0713   MCHC 34.9 06/30/2015 0713   RDW 14.0 06/30/2015 0713   LYMPHSABS 1.4 06/29/2015 0448   MONOABS 0.5 06/29/2015 0448   EOSABS 0.1 06/29/2015 0448   BASOSABS 0.0 06/29/2015 0448    Hgb A1C Lab Results  Component Value Date   HGBA1C 6.8* 06/14/2015        Assessment & Plan:   Hospital follow up for hepatic encephalopathy:  Hospital notes, labs and imaging reviewed Repeat CMET, magnesium and ammonia today Continue Lactulose, Xifaximin He will follow up with Dr. Hilarie Fredrickson as scheduled  CAD/CHF:  Continue half dose of Nadolol and Spironolactone at this time Will continue to monitor BP and fluid status  DM 2:  Continue Metformin Stop Novolin eRx for Regular insulin with sliding scale TID before meals He will continue checking blood sugars  HLD/CAD:  Discharge summary says to stop Lipitor and Ranexa I do not want you to stop these RX reordered RTC as needed or if symptoms persist or worsen

## 2015-07-09 NOTE — Patient Instructions (Signed)
Hepatic Encephalopathy Hepatic encephalopathy is a loss of brain function from advanced liver disease. The effects of the condition depend on the type of liver damage and how severe it is. In some cases, hepatic encephalopathy can be reversed. CAUSES The exact cause of hepatic encephalopathy is not known. RISK FACTORS You have a higher risk of getting this condition if your liver is damaged. When the liver is damaged harmful substances called toxins can build up in the body. Certain toxins, such as ammonia, can harm your brain. Conditions that can cause liver damage include:  An infection.  Dehydration.  Intestinal bleeding.  Drinking too much alcohol.  Taking certain medicines, including tranquilizers, water pills (diuretics), antidepressants, or sleeping pills. SIGNS AND SYMPTOMS Signs and symptoms may develop suddenly. Or, they may develop slowly and get worse gradually. Symptoms can range from mild to severe. Mild Hepatic Encephalopathy  Mild confusion.  Personality and mood changes.  Anxiety and agitation.  Drowsiness.  Loss of mental abilities.  Musty or sweet-smelling breath. Worsening or Severe Hepatic Encephalopathy  Slowed movement.  Slurred speech.  Extreme personality changes.  Disorientation.  Abnormal shaking or flapping of the hands.  Coma. DIAGNOSIS To make a diagnosis, your health care provider will do a physical exam. To rule out other causes of your signs and symptoms, he or she may order tests. You may have:  Blood tests. These may be done to check your ammonia level, measure how long it takes your blood to clot, and check for infection.  Liver function tests. These may be done to check how well your liver is working.  MRI and CT scans. These may be done to check for a brain disorder.  Electroencephalogram (EEG). This may be done to measure the electrical activity in your brain. TREATMENT The first step in treatment is identifying and  treating possible triggers. The next step is involves taking medicine to lower the level of toxins in the body and to prevent ammonia from building up. You may need to take:  Antibiotics to reduce the ammonia-producing bacteria in your gut.  Lactulose to help flush ammonia from the gut. HOME CARE INSTRUCTIONS Eating and Drinking  Follow a low-protein diet that includes plenty of fruits, vegetables, and whole grains, as directed by your health care provider. Ammonia is produced when you digest high-protein foods.  Work with a Microbiologist or with your health care provider to make sure you are getting the right balance of protein and minerals.  Drink enough fluids to keep your urine clear or pale yellow. Drinking plenty of water helps prevent constipation.  Do not drink alcohol or use illegal drugs. Medicines  Only take medicine as directed by your health care provider.  If you were prescribed an antibiotic medicine, finish it all even if you start to feel better.  Do not start any new medicines, including over-the-counter medicines, without first checking with your health care provider. SEEK MEDICAL CARE IF:  You have new symptoms.  Your symptoms change.  Your symptoms get worse.  You have a fever.  You are constipated.  You have persistent nausea, vomiting, or diarrhea. SEEK IMMEDIATE MEDICAL CARE IF:  You become very confused or drowsy.  You vomit blood or material that looks like coffee grounds.  Your stool is bloody or black or looks like tar.   This information is not intended to replace advice given to you by your health care provider. Make sure you discuss any questions you have with your health care provider.  Document Released: 09/06/2006 Document Revised: 07/18/2014 Document Reviewed: 02/12/2014 Elsevier Interactive Patient Education [employer].

## 2015-07-14 ENCOUNTER — Other Ambulatory Visit: Payer: Self-pay | Admitting: Licensed Clinical Social Worker

## 2015-07-14 ENCOUNTER — Encounter: Payer: Self-pay | Admitting: Physician Assistant

## 2015-07-14 ENCOUNTER — Ambulatory Visit (INDEPENDENT_AMBULATORY_CARE_PROVIDER_SITE_OTHER): Payer: PPO | Admitting: Physician Assistant

## 2015-07-14 VITALS — BP 112/66 | HR 64 | Ht 67.0 in | Wt 264.4 lb

## 2015-07-14 DIAGNOSIS — I8500 Esophageal varices without bleeding: Secondary | ICD-10-CM | POA: Diagnosis not present

## 2015-07-14 DIAGNOSIS — K649 Unspecified hemorrhoids: Secondary | ICD-10-CM

## 2015-07-14 DIAGNOSIS — K7581 Nonalcoholic steatohepatitis (NASH): Secondary | ICD-10-CM | POA: Diagnosis not present

## 2015-07-14 DIAGNOSIS — K7460 Unspecified cirrhosis of liver: Secondary | ICD-10-CM | POA: Diagnosis not present

## 2015-07-14 DIAGNOSIS — R188 Other ascites: Secondary | ICD-10-CM

## 2015-07-14 NOTE — Patient Instructions (Signed)
Your physician has requested that you go to the basement for lab work on 07-23-2015.  Keep your follow up with Dr Hilarie Fredrickson on 08-07-2015  Continue medications Lactulose, Xifaxan, Nadolol, Furosemide and Spirnolactone.

## 2015-07-14 NOTE — Patient Outreach (Signed)
[facility] [facility]) Care Management  07/14/2015  [place] [birth year] [id number]   Assessment-CSW completed initial outreach to patient on 07/14/15 after receiving referral in order to assist patient with social work needs. CSW was unable to reach patient successfully but left a HIPPA compliant voice message urging patient to return call once available.  Plan-CSW will make second outreach attempt by 07/17/15 if no return call has been made.  Eula Fried, BSW, MSW, [email or web address] Phone: [phone] Fax: [phone]

## 2015-07-14 NOTE — Patient Outreach (Addendum)
[place]) Care Management  07/14/2015  [place] [birth year] [id number]   Assessment-CSW received return phone call from patient on 07/14/15. CSW introduced self, reason for call and of [place] social work services. Patient agreeable to social work assistance. Referral from [place] to assist patient with Medicaid application and information. CSW explained to patient the Medicaid process and eligibility requirements. Patient reports that he receives $1,700 per month and that his wife does not receive benefits but works one day per week making $90 per one day and week. Patient reports that he had Medicaid 5 years ago while he "came out of the hospital" and that he is unsure why benefit was discontinued. CSW informed patient that according to the [place] the monthly income limit for one individual is $990 per month and $1,335 for two individuals and resource and assets limits are $2,000 for one individual and $3,000 for two individuals. Patient currently has one car in his possession and is paying for an additional car payment for his spouse and patient has paid off his house. Patient reports that his mother's house is waiting to be sold (has been on market for one month) as she passed away in 01-12-2015 and he will be getting some profits from that. Patient reports that his younger sister passed away 3 week later. CSW offered condolences and support. Patient denies any depression or feelings of sadness or hopelessness stating "I am doing just fine. I don't get down. It happens to everyone eventually." Patient expressed understanding that his family's income exceeds Medicaid income requirements. CSW informed patient that even if his monthly income is over the limit and he has high medical bills then he may still qualify for Medicaid deductible. Patient reports that he will consider information and would like to discuss this with  his wife.Patient is agreeable to CSW mailing out a list of Medicaid information and application that has been reviewing during this phone call.   Plan-Patient will make next outreach by 07/22/14 to further discuss social work needs.CSW will send message in the form of in basket to [place] to request medicaid information resources be mailed to patient's residence.  Eula Fried, BSW, MSW, [email or web address] Phone: [phone] Fax: [phone]

## 2015-07-14 NOTE — Progress Notes (Addendum)
Patient ID: [place], male   DOB: [birth year], 60 y.o.   MRN: [id number]     History of Present Illness: SOHAIL Butler is a 60 year old male who was known to Dr. Hilarie Fredrickson. He has a history of [place] cirrhosis with decompensation including hepatic encephalopathy, ascites, and lower remedies edema as well as esophageal and rectal varices. He was diagnosed with [place] in 2011 at the time of myocardial infarction. He has had numerous hospitalizations since June of this year for hepatic encephalopathy. He also experiences ascites and lower extremity edema and is currently on Lasix 40 mg daily as well as Aldactone 12.5 mg daily. He has no history of SBP. He has esophageal and rectal varices and is currently on 40 mg of nadolol daily as well as Prilosec 20 mg. He was evaluated at the CT chest liver clinic on December 13 for consideration for potential transplant. Unfortunately due to his BMI he is currently not a candidate. His meld score is 13 and he has a compensation that would warrant liver transplant eval however due to his multiple comorbidities and BMI of 42 he was felt to be a poor candidate. In order to be considered for liver transplantation at [facility] he would need to have a BMI of 35 or less in light of his history of diabetes and cardiac disease. The patient expressed his wishes to stay out of the hospital. Unfortunately 4 days after being seen at the CHF clinic he was admitted to the hospital with hepatic encephalopathy. His lactulose was increased to 45 ML's 3 times daily. He is here for follow-up. He states he is feeling well. He has had much clearer mentation except for this past weekend when he ate pork and was confused the next day. He has kept his weight down and has not reaccumulated fluid in his lower extremities. He has no chest pain or shortness of breath.   Past Medical History  Diagnosis Date  . Cirrhosis ([place]) 2011    Cryptogenic, Likely NASH. Family/pt deny EtOH.  HCV, HBV, HAV negative. ANA negative. AMA positive. Ascites 12/11  . Hyperlipemia   . Coronary artery disease     Inferior MI 12/11; LHC with occluded mid CFX and 80% proximal RCA. EF 55%. He had 3.0 x 28 vision BMS to CFX  . Diastolic CHF, acute ([place])     Echo 12/11 with ef 50-55% and mild LVH. EF 55% by LV0gram in 12/11  . Hypertension   . Type II diabetes mellitus ([place]) 2011  . SVT (supraventricular tachycardia) ([place])     1/12: appeared to be an ectopic atrial tachycardia. Required DCCV with hemodynamic instability  . GI bleed     12/11: Etiology not clearly defined. EGD: nonbleeding esophageal varices.  . S/P coronary artery stent placement 06/2010  . Esophageal varices ([place]) 2011, 2013    no hx acute variceal bleed  . Hepatic encephalopathy ([place]) 2011, 12/2013  . Myocardial infarction [facility]) ? 2012  . Shortness of breath dyspnea   . Arthritis   . Hx of echocardiogram     Echo 5/16:  EF 55-60%, no RWMA, mod LAE  . Thrombocytopenia ([place])   . Cholelithiasis   . Rectal varices   . PFO (patent foramen ovale): Per TEE 04/20/2015 04/20/2015  . Bacteremia   . Portal hypertension ([place])   . Rectal varices     Past Surgical History  Procedure Laterality Date  . Cardiac catheterization  07/01/2010    BMS to CFX.  Marland Kitchen  Appendectomy    . Orif r leg    . Esophagogastroduodenoscopy  04/13/2012    Procedure: ESOPHAGOGASTRODUODENOSCOPY (EGD);  Surgeon: Inda Castle, MD;  Location: [facility] ENDOSCOPY;  Service: Endoscopy;  Laterality: N/A;  . Colonoscopy  04/13/2012    Procedure: COLONOSCOPY;  Surgeon: Inda Castle, MD;  Location: [facility] ENDOSCOPY;  Service: Endoscopy;  Laterality: N/A;  . Esophagogastroduodenoscopy N/A 01/01/2014    Procedure: ESOPHAGOGASTRODUODENOSCOPY (EGD);  Surgeon: Jerene Bears, MD;  Location: [facility] ENDOSCOPY;  Service: Endoscopy;  Laterality: N/A;  . Esophageal banding N/A 01/01/2014    Procedure: ESOPHAGEAL BANDING;  Surgeon: Jerene Bears, MD;  Location: [facility] ENDOSCOPY;  Service:  Endoscopy;  Laterality: N/A;  . Coronary stent placement  06/30/2010    CFX   Distal        . Left heart catheterization with coronary angiogram N/A 10/15/2014    Procedure: LEFT HEART CATHETERIZATION WITH CORONARY ANGIOGRAM;  Surgeon: Peter M Martinique, MD;  Location: [facility] CATH LAB;  Service: Cardiovascular;  Laterality: N/A;  . Tee without cardioversion N/A 04/20/2015    Procedure: TRANSESOPHAGEAL ECHOCARDIOGRAM (TEE);  Surgeon: Fay Records, MD;  Location: [facility] ENDOSCOPY;  Service: Cardiovascular;  Laterality: N/A;   Family History  Problem Relation Age of Onset  . Heart attack Brother 59    MI  . Diabetes Brother   . Heart attack Father   . Diabetes Father   . COPD Father   . COPD Mother   . Stroke Neg Hx   . Heart attack Brother    Social History  Substance Use Topics  . Smoking status: Current Every Day Smoker -- 1.00 packs/day for 36 years    Types: E-cigarettes  . Smokeless tobacco: Never Used     Comment: uses vapor cigarettes (2016 )  . Alcohol Use: No   Current Outpatient Prescriptions  Medication Sig Dispense Refill  . aspirin 81 MG tablet Take 81 mg by mouth daily.     Marland Kitchen atorvastatin (LIPITOR) 10 MG tablet Take 1 tablet (10 mg total) by mouth daily. 30 tablet 2  . furosemide (LASIX) 40 MG tablet Take 40 mg by mouth daily.     . insulin regular (NOVOLIN R,HUMULIN R) 100 units/mL injection According to sliding scale: 151-200: 2 units, 201-250 4 units, 251-300 6 units, 301-350 8 units. If > 400 call me. 10 mL 11  . lactulose (CHRONULAC) 10 GM/15ML solution Take 45 mLs (30 g total) by mouth 3 (three) times daily. May take an additional dose as needed for bowel movement 1350 mL 2  . metFORMIN (GLUCOPHAGE) 1000 MG tablet Take 1,000 mg by mouth 2 (two) times daily.  11  . Multiple Vitamin (MULTIVITAMIN) capsule Take 1 capsule by mouth daily.      . nadolol (CORGARD) 40 MG tablet Take 0.5 tablets (20 mg total) by mouth daily. 30 tablet 5  . nitroGLYCERIN (NITROSTAT) 0.4 MG SL tablet  Place 0.4 mg under the tongue every 5 (five) minutes as needed for chest pain (chest pain).    Marland Kitchen omeprazole (PRILOSEC) 20 MG capsule Take 1 capsule (20 mg total) by mouth daily. 30 capsule 6  . ranolazine (RANEXA) 500 MG 12 hr tablet Take 1 tablet (500 mg total) by mouth 2 (two) times daily. 60 tablet 2  . rifaximin (XIFAXAN) 550 MG TABS tablet Take 1 tablet (550 mg total) by mouth 2 (two) times daily. 24 tablet 0  . spironolactone (ALDACTONE) 25 MG tablet Take 0.5 tablets (12.5 mg total) by mouth  daily. 30 tablet 11   No current facility-administered medications for this visit.   Allergies  Allergen Reactions  . Isosorbide Other (See Comments)    Nose bleeds  . Lactose Intolerance (Gi) Other (See Comments)     Review of Systems: Gen: Denies any fever, chills, sweats, anorexia, fatigue, weakness, malaise, weight loss, and sleep disorder CV: Denies chest pain, angina, palpitations, syncope, orthopnea, PND, peripheral edema, and claudication. Resp: Denies dyspnea at rest, dyspnea with exercise, cough, sputum, wheezing, coughing up blood, and pleurisy. GI: Denies vomiting blood, jaundice, and fecal incontinence.   Denies dysphagia or odynophagia. GU : Denies urinary burning, blood in urine, urinary frequency, urinary hesitancy, nocturnal urination, and urinary incontinence. MS: Denies joint pain, limitation of movement, and swelling, stiffness, low back pain, extremity pain. Denies muscle weakness, cramps, atrophy.  Derm: Denies rash, itching, dry skin, hives, moles, warts, or unhealing ulcers.  Psych: Denies depression, anxiety, memory loss, suicidal ideation, hallucinations, paranoia, and confusion. Heme: Denies bruising, bleeding, and enlarged lymph nodes. Neuro:  Denies any headaches, dizziness, paresthesia Endo:  Denies any problems with DM, thyroid, adrenal  LAB RESULTS: Blood work on 07/09/2015 revealed an ammonia of 98.   Studies:   Ct Head Wo Contrast  06/27/2015  CLINICAL  DATA:  Altered mental status. Patient is currently nonverbal but withdrawal is from pain. History of esophageal varices, cirrhosis, diabetes, COPD, hypertension. EXAM: CT HEAD WITHOUT CONTRAST TECHNIQUE: Contiguous axial images were obtained from the base of the skull through the vertex without intravenous contrast. COMPARISON:  06/14/2015 FINDINGS: Mild cerebral atrophy. No ventricular dilatation. Patchy low-attenuation changes in the deep white matter are nonspecific but likely represent small vessel ischemic change. Other white matter disease not excluded. No significant change since previous study. No mass effect or midline shift. No abnormal extra-axial fluid collections. Gray-white matter junctions are distinct. Basal cisterns are not effaced. No evidence of acute intracranial hemorrhage. No depressed skull fractures. Visualized paranasal sinuses and mastoid air cells are not opacified. IMPRESSION: No acute intracranial abnormalities. Mild chronic atrophy and white matter disease. Electronically Signed   By: Lucienne Capers M.D.   On: 06/27/2015 01:26   Dg Chest Port 1 View  06/27/2015  CLINICAL DATA:  Altered mental status. EXAM: PORTABLE CHEST 1 VIEW COMPARISON:  06/14/2015 FINDINGS: Low lung volumes, similar to prior exam. Cardiomediastinal contours are unchanged. Mild left basilar atelectasis. No pulmonary edema, confluent airspace disease, large pleural effusion or pneumothorax. No acute osseous abnormalities. Remote mid left clavicle fracture again seen. IMPRESSION: Hypoventilatory chest with mild left basilar atelectasis. Electronically Signed   By: Jeb Levering M.D.   On: 06/27/2015 03:20   Dg Abd Portable 1v  06/27/2015  CLINICAL DATA:  Feeding tube placement. EXAM: PORTABLE ABDOMEN - 1 VIEW COMPARISON:  None. FINDINGS: A small bore feeding tube is identified with tip overlying the proximal-mid duodenum. The bowel gas pattern is unremarkable. IMPRESSION: Small bore feeding tube with tip  overlying the proximal -mid duodenum. Electronically Signed   By: Margarette Canada M.D.   On: 06/27/2015 15:08     Physical Exam: BP 112/66 mmHg  Pulse 64  Ht 5' 7"  (1.702 m)  Wt 264 lb 6.4 oz (119.931 kg)  BMI 41.40 kg/m2 General: Pleasant, well developed , male in no acute distress Head: Normocephalic and atraumatic Eyes:  sclerae anicteric, conjunctiva pink  Ears: Normal auditory acuity Lungs: Clear throughout to auscultation Heart: Regular rate and rhythm Abdomen: Soft, non distended, non-tender. No masses, no hepatomegaly. Normal bowel sounds Musculoskeletal:  Symmetrical with no gross deformities  Extremities: 1+ lower extremity edema Neurological: Alert oriented x 4, grossly nonfocal, no asterixis Psychological:  Alert and cooperative. Normal mood and affect  Assessment and Recommendations:  60 year old white male with a history of [place] cirrhosis, hepatic encephalopathy, ascites, lower extremity edema, esophageal and rectal varices, here for follow-up. He will continue Lasix 40 mg daily, lactulose 45 ML's 3-4 times daily, Nadolol 40 mg one half tablet daily, omeprazole 20 mg daily, Xifaxan 500 mg twice daily, and Aldactone 12.5 mg daily. He has been reminded to limit salt intake and to avoid red meats and pork. He will have a repeat basic metabolic panel, hepatic function panel, and ammonia drawn in one week and will follow up here at a previously scheduled follow up on January 27.    Jannessa Ogden, Vita Barley PA-C 07/14/2015,  Addendum: Reviewed and agree with management. Pt has been seen in liver clinic [facility]) and currently is not a transplant candidate.  Portal HTN and cirrhosis is advanced with end stage liver disease conferring a most likely short survival.  We will work with him and primary care to control the complications of portal hypertension going forward. Jerene Bears, MD

## 2015-07-16 ENCOUNTER — Other Ambulatory Visit: Payer: Self-pay | Admitting: *Deleted

## 2015-07-16 NOTE — Patient Outreach (Signed)
S:  Pt is feeling good. He has not been in the hospital for 2 weeks. He has had his follow up appt with Dr. Vena Rua office.       He will have labs next week. Pt noted after eating BBQ pork he felt less alert so he knows he should not eat pork.      He is now using regular insulin on a sliding scale. His FBS range since this change has been 114-247. The high was         only value over 200. Lunch values 124-306 and PM values 193-258.  O:  BP 98/60 mmHg  Pulse 72  Resp 16  Wt 268 lb (121.564 kg)  SpO2 97% FBS: 128        A:  Hepatic encephalopathy      Diabetes  P:  Advised to check pt glucose when pt has a change in level of consciousness to verify if change is from hypoglycemia       versus encephalopathy.        Reminded to call if any problems.        I will call him next week.  [name] [facility] [place] [phone]

## 2015-07-20 ENCOUNTER — Other Ambulatory Visit: Payer: Self-pay | Admitting: Licensed Clinical Social Worker

## 2015-07-20 DIAGNOSIS — I5032 Chronic diastolic (congestive) heart failure: Secondary | ICD-10-CM

## 2015-07-20 NOTE — Patient Outreach (Signed)
[name]) Care Management  07/20/2015  Adam [place] 1955-12-18 [id number]   Request received from Eula Fried, LCSW to mail patient information on Medicaid resources. Information mailed today, 07/20/15.    [name]  [employer] Care Management Assistant

## 2015-07-20 NOTE — Patient Outreach (Signed)
See previously documented note.

## 2015-07-20 NOTE — Patient Outreach (Addendum)
[place] [facility]) Care Management  07/20/2015  Adam Butler Mar 14, 1956 [id number]   Assessment-CSW completed outreach to patient on 07/20/15 and patient answered and provided HIPPA verifications. Patient reports that he has stayed home during the inclement weather and has enough food in his house. CSW questioned if he had received Medicaid helpful information resources in the mail and he declined. CSW informed patient that she will submit an additional request for information to be mailed to residence as well as financial resources for [employer] and [employer]. Patient reports understanding that both his income and assets exceed Medicaid requirements. Patient understands that his assets must not exceed $3,000 dollars. Patient reports "My truck is more expensive than that and the house I own will definitely be too much too." CSW shared that if he still had interest in completing Medicaid application that this CSW can assist him. Patient shares that he has discussed this with his wife and that he feels "there isn't any use to apply because we make too much." However, patient is still agreeable to this CSW mailing out resources on Medicaid and the application process. CSW questioned what patient's biggest financial stressor was at this time. Patient states "We are doing fine really. I can't complain. There are so many other people out there doing a lot worse than [place]. We have no problems with getting food or anything like that. As long as I am able to afford my medications I will be just fine." CSW questioned financial concerns for being able to afford medications. Patient reports that he he affords medication Novolin because he is paying on a sliding scale fee. Patient is unable to report the amount of money he spends on medications per month but reports it as a concern. Since patient will not qualify for Medicaid, CSW would like to assist patient in alleviating any other health  care cost. Patient is agreeable to [place] Pharmacist referral.   Plan-CSW will make referral to pharmacy in order to evaluate medication cost and need for assistance. CSW will request that Care Management Assistant mail patient needed community resources. CSW will complete outreach in one week to review community resources.  Eula Fried, BSW, MSW, [email or web address] Phone: [phone] Fax: [phone]

## 2015-07-22 ENCOUNTER — Other Ambulatory Visit: Payer: Self-pay | Admitting: Licensed Clinical Social Worker

## 2015-07-22 NOTE — Patient Outreach (Signed)
[place] [facility]) Care Management  07/22/2015  [place] June 10, 1956 [id number]   Assessment-CSW received in basket staff message from [place] that provided updates on patient's health (in regards to his end of stage liver disease) and need for additional support. CSW contacted [place] and was provide with further updates on patient. CSW completed outreach to patient on 07/22/15 and patient answered. Patient reports "I'm doing fine today. I just got off the phone with my cousin and we talked awhile." CSW questioned patient on how much emotional support he was receiving from family and friends. Patient states "I get all the support I need. I talk to my wife when I need to but I really am close with my cousin. He is there for me when I need him." When CSW asked how often patient communicates with cousin he reported "as much as I need to." Patient shares that his cousin has healing hands practices and rituals with him which have helped patient to get better. Patient reports that his cousin has taught him and now he is able to perform such practices on himself. Patient reports that he use to attend church and that he and his wife are currently looking to find a new church so that he can gain socialization and support. Patient reports that he is religious and that he spends time worshipping and praying when he is able. Patient reports that his cousin prays with him over the phone weekly. Patient reports that his cousin lives in [place], [place] and will be completing a visit within the next few weeks. Patient reports that he does not experience any depressive symptoms or thoughts but that when he went to visit GI specialist and was informed of his end of stage liver disease that he did experience sadness and anger but was able to share these emotions with his cousin and gain advice. Patient reports "What is there to be sad about? I have great people that care about me. I have nice  nurses and social workers that check on me. I'm content with my life." CSW educated patient on various community resources and programs that could provide patient with addiitional support. CSW explained the Palliative Care program and their benefits but patient denies interest in using service at this time but was agreeable to allowing CSW to discuss program. Patient denies interest in support groups CSW educated patient on available yoga and meditation groups for healthy living but patient also denies interest. Patient allowed for CSW to provide patient with healthy coping methods for stress or depression such as relaxation, talking to a loved one, time to yourself, spirituality, humor, reading and exercise. Patient denies needing for CSW to mail him a list of these healthy coping techniques. Patient states "I use humor as my biggest coping method then because I'm a funny guy. I like to joke and make people laugh." Patient shares "I give myself 15 minutes to worry a day and after that I put it away in a jar and save it for a later day when I have the time." CSW continued to provide emotional support and reflective listening to patient. Patient is aware that he can contact CSW at any time when he wishes to gain additional support.   CSW questioned if patient was able to get advance directive notarized (Adam completed this with him previously) and patient declined stating "I have it out and ready so when we go to the bank I can bring it." CSW suggested that  he make additional copies of signed advance directive so that he can have copies at this house and also provide to her medical providers. CSW will continue to encourage patient to get document notarized.   Plan-CSW will update [place] and will continue to provide social work services and support to patient as needed.   [place], BSW, MSW, [email or web address] Phone: [phone] Fax:  [phone]

## 2015-07-23 ENCOUNTER — Other Ambulatory Visit (INDEPENDENT_AMBULATORY_CARE_PROVIDER_SITE_OTHER): Payer: PPO

## 2015-07-23 ENCOUNTER — Other Ambulatory Visit: Payer: Self-pay | Admitting: *Deleted

## 2015-07-23 DIAGNOSIS — K7581 Nonalcoholic steatohepatitis (NASH): Secondary | ICD-10-CM

## 2015-07-23 LAB — BASIC METABOLIC PANEL
BUN: 10 mg/dL (ref 6–23)
CHLORIDE: 99 meq/L (ref 96–112)
CO2: 24 mEq/L (ref 19–32)
Calcium: 9 mg/dL (ref 8.4–10.5)
Creatinine, Ser: 0.91 mg/dL (ref 0.40–1.50)
GFR: 90.52 mL/min (ref >60.00)
GLUCOSE: 265 mg/dL — AB (ref 70–99)
Potassium: 3.8 mEq/L (ref 3.5–5.1)
Sodium: 135 mEq/L (ref 135–145)

## 2015-07-23 LAB — HEPATIC FUNCTION PANEL
ALBUMIN: 3 g/dL — AB (ref 3.5–5.2)
ALK PHOS: 129 U/L — AB (ref 39–117)
ALT: 32 U/L (ref 0–53)
AST: 46 U/L — AB (ref 0–37)
BILIRUBIN TOTAL: 1.5 mg/dL — AB (ref 0.2–1.2)
Bilirubin, Direct: 0.5 mg/dL — ABNORMAL HIGH (ref 0.0–0.3)
Total Protein: 7 g/dL (ref 6.0–8.3)

## 2015-07-23 LAB — AMMONIA: Ammonia: 83 umol/L — ABNORMAL HIGH (ref 11–35)

## 2015-07-23 NOTE — Patient Outreach (Signed)
Transition of care call #4 - Pt reports he is well. We discussed his conversation with our CSW yesterday and I reinforced to him that Jerene Pitch is a Microbiologist for him and to remember that she or I am available to him if he has any questions or just needs someone to talk to. He says his cousin with the [employer] is coming in a couple of weeks and I requested that I visit when he is in town. Pt reports he had his labs drawn this morning. He will see Dr. Hilarie Fredrickson, his GI specialist in 2 weeks. He is taking his meds as ordered. He is usually following his diet. His FBS today was 123. I will call pt again next week and I have reminded him to call me or another provider if he recognizes there is a problem.  [name] [facility] [place] [phone]

## 2015-07-29 ENCOUNTER — Other Ambulatory Visit: Payer: Self-pay | Admitting: Pharmacist

## 2015-07-29 ENCOUNTER — Telehealth: Payer: Self-pay

## 2015-07-29 NOTE — Patient Outreach (Addendum)
Adam Butler is a 60 y.o. male referred to pharmacy for medication assistance. Called and spoke with Mr. Zingaro who reports that between his medical expenses, groceries and medications, finances are tight. States that he is currently able to afford to purchase all of his medications. Reports that he currently receives his Xifaxan through patient assistance from the manufacturer. However, reports that his Ranexa is $45/month.  Discuss with patient the Extra Help application. Patient reports that he has not completed this in the past. Look up patient's Extra Help Status on the Medicare.gov website and confirm that patient does not currently have an extra help subsidy at this time. Also discuss with patient the patient assistance program for Ranexa through the manufacturer. Patient reports that he is interested in help applying for any of these programs that might help. Let patient know that I will ask Care Management Assistant Damita Rhodie to reach out to him to assist him with these resources.  Note that patient with a history of hepatic cirrhosis. Patient also on Ranexa and atorvastatin, each of which is extensively hepatically cleared and contraindicated in hepatic cirrhosis or active liver disease respectively. Note that patient seen and evaluated by [facility] on 07/14/15. Patient reports that he has been instructed by his PCP to continue each of these medications.  Patient states that he has no further questions for me at this time. Provide patient with my phone number.  PLAN:  1) Will follow up with patient's gastroenterologist regarding the continued use of atorvastatin and Ranexa in this patient based on his hepatic function.  2) Provided Ranexa not to be held due to hepatic function, will send InBasket message to Care Management Assistant Damita Rhodie asking her to assist the patient with the Ranexa patient assistance application and Extra Help application.  3) Will follow  with Damita as she helps patient with these applications.  [name], PharmD Clinical Pharmacist [place] [facility] [phone]

## 2015-07-29 NOTE — Telephone Encounter (Signed)
Received call from Elizabeth-Clinical Pharmacist with [facility]-called and wanted to check with Dr. Hilarie Fredrickson and see if pt should still be taking Ranexa and Lipitor given his history of Cirrhosis. States she reviewed his OV note with Lori Hvozdovic, PA-C and it did not mention these meds. Dr. Hilarie Fredrickson please advise.  Benjamine [place]

## 2015-07-29 NOTE — Telephone Encounter (Signed)
This decision should be deferred to his cardiologist, Dr. Aundra Dubin, but from GI/liver standpoint, I am okay if they are continued

## 2015-07-29 NOTE — Patient Outreach (Signed)
Left message with Vaughan Basta in Dr. Vena Rua office regarding the continued use of atorvastatin and Ranexa in this patient based on his hepatic function. Note that patient with a history of hepatic cirrhosis. Note Ranexa and atorvastatin are each extensively hepatically cleared and contraindicated in hepatic cirrhosis or active liver disease respectively.   [name], PharmD Clinical Pharmacist [place] Management [phone]

## 2015-07-29 NOTE — Telephone Encounter (Signed)
Spoke with [facility] and she is aware.

## 2015-07-29 NOTE — Patient Outreach (Signed)
Receive a phone call back from [facility] in Dr. Vena Rua office regarding the continued use of atorvastatin and Ranexa in this patient based on his hepatic function. Vaughan Basta states that per Dr. Hilarie Fredrickson, "from GI/liver standpoint, he is okay if they are continued."  PLAN:  1) Will send InBasket message to Care Management Assistant Damita Rhodie asking her to assist the patient with the Ranexa patient assistance application and Extra Help application.  2) Will follow with Damita as she helps patient with these applications.   [name], PharmD Clinical Pharmacist [place] [facility] [phone]

## 2015-08-03 NOTE — Patient Outreach (Signed)
[facility]) Care Management  08/03/2015  Adam Butler 08-07-1955 [id number]   Telephone outreach to patient to inform him that until he reaches the coverage gap with Medicare, he would not be able to apply for the Ranexa patient assistance program. He was also over the income limit to apply for extra help through social security.  Lataysha Vohra L. Larz Mark, [place] Care Management Assistant

## 2015-08-05 ENCOUNTER — Other Ambulatory Visit: Payer: Self-pay | Admitting: Licensed Clinical Social Worker

## 2015-08-05 NOTE — Patient Outreach (Signed)
[facility]) Care Management  08/05/2015  Adam Butler 09/24/1955 [id number]   Assessment-CSW completed outreach to patient in order to remind him to get Advance Directive notarized. CSW unable to reach patient at this time. CSW left a HIPPA compliant voice message.  Plan-CSW will complete next outreach by 08/14/15 in order to provide further social work assistance.  Eula Fried, BSW, MSW, [email or web address] Phone: [phone] Fax: [phone]

## 2015-08-07 ENCOUNTER — Encounter: Payer: Self-pay | Admitting: Internal Medicine

## 2015-08-07 ENCOUNTER — Other Ambulatory Visit: Payer: Self-pay | Admitting: Licensed Clinical Social Worker

## 2015-08-07 ENCOUNTER — Ambulatory Visit (INDEPENDENT_AMBULATORY_CARE_PROVIDER_SITE_OTHER): Payer: PPO | Admitting: Internal Medicine

## 2015-08-07 VITALS — BP 118/78 | HR 72 | Ht 67.0 in | Wt 261.1 lb

## 2015-08-07 DIAGNOSIS — K766 Portal hypertension: Secondary | ICD-10-CM

## 2015-08-07 DIAGNOSIS — K7682 Hepatic encephalopathy: Secondary | ICD-10-CM

## 2015-08-07 DIAGNOSIS — K7290 Hepatic failure, unspecified without coma: Secondary | ICD-10-CM | POA: Diagnosis not present

## 2015-08-07 DIAGNOSIS — K7581 Nonalcoholic steatohepatitis (NASH): Secondary | ICD-10-CM | POA: Diagnosis not present

## 2015-08-07 DIAGNOSIS — K7460 Unspecified cirrhosis of liver: Secondary | ICD-10-CM

## 2015-08-07 MED ORDER — LACTULOSE 10 GM/15ML PO SOLN: 30.0000 g | Freq: Three times a day (TID) | ORAL | Status: DC

## 2015-08-07 MED ORDER — RIFAXIMIN 550 MG PO TABS: 550.0000 mg | ORAL_TABLET | Freq: Two times a day (BID) | ORAL | Status: DC

## 2015-08-07 MED ORDER — SPIRONOLACTONE 25 MG PO TABS: 12.5000 mg | ORAL_TABLET | Freq: Every day | ORAL | Status: DC

## 2015-08-07 MED ORDER — FUROSEMIDE 40 MG PO TABS: 40.0000 mg | ORAL_TABLET | Freq: Every day | ORAL | Status: DC

## 2015-08-07 NOTE — Patient Instructions (Signed)
We have sent  medications to your pharmacy for you to pick up at your convenience. Follow up in 3 months Continue all other medications as directed.

## 2015-08-07 NOTE — Patient Outreach (Signed)
[facility]) Care Management  08/07/2015  Adam Butler 21-May-1956 [id number]   Assessment-CSW completed outreach to patient on 08/07/15. Patient answered. Patient reports that he is currently at the hospital right now as his brother has been admitted. Patient reports that he went over to his brother's residence yesterday and that his brother was "wasn't himself and was completely out of it" and patient called 911. Patient reports that he went to the hospital last night to check on brother. Patient admits that this time is stressful for him due to his brother's hospitalization as he was also hospitalized last week. CSW offered emotional support for patient. CSW questioned if he has had anyone to confide in. Patient reports that his cousin has been busy and that he has not had a chance to talk to him over the past few weeks. Patient shares that he will contact him soon in order to gain appropriate support as his cousin is someone that he feels safe enough to talk to. Patient reports that he is taking all medications as prescribed. Patient reports "Right now my health is going good. I have a Gastroenterology appointment today so I can give you more updates after that."   Plan-CSW will complete next outreach on 08/17/15 to provide social work support and assistance.  Eula Fried, BSW, MSW, [email or web address] Phone: [phone] Fax: [phone]

## 2015-08-07 NOTE — Progress Notes (Signed)
Subjective:    Patient ID: Adam Butler, male    DOB: May 21, 1956, 60 y.o.   MRN: [id number]  HPI  Adam Butler is a 60 year old male with NASH  Cirrhosis and portal hypertension complicated by hepatic encephalopathy, small esophageal varices, rectal varices history of ascites and thrombocytopenia is here for follow-up. He is here alone today. He was last seen in the office several weeks ago by [employer], PA-C. Fortunately he has been doing better from hepatic encephalopathy standpoint. Prior to be in seen earlier this month he had been hospitalized multiple times for confusion and hepatic encephalopathy. He is now taking lactulose 3 times a day rather than 2,  Specifically taking a dose before bedtime. He continues rifaximin 550 twice a day. He is feeling well. He is trying to eat a low-sodium diet in trying to stay active. He is taking Lasix 40 mg and Aldactone 25 mg daily. He continues nadolol 20 mg daily. He is using a fit bit and measuring his steps having walked 2700 steps so far today. He averages about 2500 steps. He continues on Prilosec 20 mg daily.   He was seen by [facility] liver clinic and transplantation was discussed. It was felt that this would not be possible due to his obesity.  end-of-life discussions were had at this appointment but he states he was not ready to hear that  Review of Systems  as per history of present illness, otherwise negative  Current Medications, Allergies, Past Medical History, Past Surgical History, Family History and Social History were reviewed in [employer] record.      Objective:   Physical Exam BP 118/78 mmHg  Pulse 72  Ht 5' 7"  (1.702 m)  Wt 261 lb 2 oz (118.446 kg)  BMI 40.89 kg/m2 Constitutional: Well-developed and well-nourished. No distress. HEENT: Normocephalic and atraumatic. Oropharynx is clear and moist. No oropharyngeal exudate. Conjunctivae are normal.  No scleral icterus. Neck: Neck supple. Trachea  midline. Cardiovascular: Normal rate, regular rhythm and intact distal pulses. No M/R/G Pulmonary/chest: Effort normal and breath sounds normal. No wheezing, rales or rhonchi. Abdominal: Soft, obese, nontender, nondistended. Bowel sounds active throughout. T Extremities: no clubbing, cyanosis, trace pretib edema Neurological: Alert and oriented to person place and time. No asterixis Skin: Skin is warm and dry. No rashes noted. Psychiatric: Normal mood and affect. Behavior is normal.  CBC    Component Value Date/Time   WBC 5.2 07/09/2015 1119   RBC 3.74* 07/09/2015 1119   HGB 12.6* 07/09/2015 1119   HCT 37.8* 07/09/2015 1119   PLT 90.0* 07/09/2015 1119   MCV 101.1* 07/09/2015 1119   MCH 35.0* 06/30/2015 0713   MCHC 33.3 07/09/2015 1119   RDW 14.8 07/09/2015 1119   LYMPHSABS 1.4 06/29/2015 0448   MONOABS 0.5 06/29/2015 0448   EOSABS 0.1 06/29/2015 0448   BASOSABS 0.0 06/29/2015 0448    CMP     Component Value Date/Time   NA 135 07/23/2015 0858   K 3.8 07/23/2015 0858   CL 99 07/23/2015 0858   CO2 24 07/23/2015 0858   GLUCOSE 265* 07/23/2015 0858   BUN 10 07/23/2015 0858   CREATININE 0.91 07/23/2015 0858   CREATININE 0.74 03/15/2011 0827   CALCIUM 9.0 07/23/2015 0858   PROT 7.0 07/23/2015 0858   ALBUMIN 3.0* 07/23/2015 0858   AST 46* 07/23/2015 0858   ALT 32 07/23/2015 0858   ALKPHOS 129* 07/23/2015 0858   BILITOT 1.5* 07/23/2015 0858   GFRNONAA >60 06/30/2015 8850  GFRAA >60 06/30/2015 0713   EGD -- 01/01/2014 -- small esophageal varices in the middle and distal third of the esophagus which completely flatten with insufflation. Normal stomach. Normal duodenum. Colonoscopy 04/13/2012 -- portable hypertensive colopathy, rectal varices and some friable mucosa. Most noticeable in the right colon. No polyps were seen.     Assessment & Plan:  60 year old male with NASH  Cirrhosis and portal hypertension complicated by hepatic encephalopathy, small esophageal varices,  rectal varices history of ascites and thrombocytopenia is here for follow-up.   1. NASH cirrhosis with portal HTN --  Recently hepatic encephalopathy has been under better control with dosing lactulose 3 times daily.  Certainly will continue this dose. Continue rifaximin 550 mg twice a day. -- [place] screening --  Up-to-date repeat ultrasound in April 2017 -- variceal screening --  History of small varices on nadolol. -- Low sodium diet -- no alcohol -- follow-up in 3 months -- Continue current dose of Lasix and Aldactone  25 minutes spent with the patient today. Greater than 50% was spent in counseling and coordination of care with the patient

## 2015-08-14 ENCOUNTER — Other Ambulatory Visit: Payer: Self-pay | Admitting: Pharmacist

## 2015-08-14 NOTE — Patient Outreach (Signed)
Received a message from Care Management Assistant Damita Rhodie that until he reaches the coverage gap with Medicare, he would not be able to apply for the Ranexa patient assistance program and that he was also over the income limit to apply for extra help through social security.  Called to follow up with Adam Butler who reports that he has no further questions for me at this time. Patient confirm that he has my phone number. Will close pharmacy episode at this time.  [name], PharmD Clinical Pharmacist [place] Management [phone]

## 2015-08-17 ENCOUNTER — Other Ambulatory Visit: Payer: Self-pay | Admitting: *Deleted

## 2015-08-17 NOTE — Patient Outreach (Signed)
Telephone call to follow up on pt condition. He report he is doing well. He has had at least one episode of decreased LOC from elevated amonia but he and his family are doing much better in identifying the onset and treating it with the lactulose and by managing his diet more compliantly. He emphatically states, "I'm gonna live, no matter what anyone says!"   He is not scheduled to see any provider until April, when he will see his primary care provider.  I will stay in frequent contact with him and see him next week.  [name] [facility] [phone]

## 2015-08-18 ENCOUNTER — Encounter (HOSPITAL_COMMUNITY): Payer: Self-pay | Admitting: *Deleted

## 2015-08-18 ENCOUNTER — Other Ambulatory Visit: Payer: Self-pay | Admitting: Licensed Clinical Social Worker

## 2015-08-18 ENCOUNTER — Observation Stay (HOSPITAL_COMMUNITY)
Admission: EM | Admit: 2015-08-18 | Discharge: 2015-08-19 | Disposition: A | Discharge status: Home / Self Care | Payer: PPO | Attending: Internal Medicine | Admitting: Internal Medicine

## 2015-08-18 DIAGNOSIS — Z79899 Other long term (current) drug therapy: Secondary | ICD-10-CM | POA: Insufficient documentation

## 2015-08-18 DIAGNOSIS — I471 Supraventricular tachycardia: Secondary | ICD-10-CM | POA: Diagnosis not present

## 2015-08-18 DIAGNOSIS — K649 Unspecified hemorrhoids: Secondary | ICD-10-CM | POA: Insufficient documentation

## 2015-08-18 DIAGNOSIS — D696 Thrombocytopenia, unspecified: Secondary | ICD-10-CM | POA: Diagnosis not present

## 2015-08-18 DIAGNOSIS — M1990 Unspecified osteoarthritis, unspecified site: Secondary | ICD-10-CM | POA: Diagnosis not present

## 2015-08-18 DIAGNOSIS — I10 Essential (primary) hypertension: Secondary | ICD-10-CM | POA: Insufficient documentation

## 2015-08-18 DIAGNOSIS — K766 Portal hypertension: Secondary | ICD-10-CM | POA: Diagnosis not present

## 2015-08-18 DIAGNOSIS — I2510 Atherosclerotic heart disease of native coronary artery without angina pectoris: Secondary | ICD-10-CM | POA: Diagnosis not present

## 2015-08-18 DIAGNOSIS — K7460 Unspecified cirrhosis of liver: Secondary | ICD-10-CM | POA: Insufficient documentation

## 2015-08-18 DIAGNOSIS — R4182 Altered mental status, unspecified: Secondary | ICD-10-CM | POA: Diagnosis not present

## 2015-08-18 DIAGNOSIS — D649 Anemia, unspecified: Secondary | ICD-10-CM | POA: Diagnosis present

## 2015-08-18 DIAGNOSIS — Q211 Atrial septal defect: Secondary | ICD-10-CM | POA: Insufficient documentation

## 2015-08-18 DIAGNOSIS — Z794 Long term (current) use of insulin: Secondary | ICD-10-CM | POA: Insufficient documentation

## 2015-08-18 DIAGNOSIS — K8020 Calculus of gallbladder without cholecystitis without obstruction: Secondary | ICD-10-CM | POA: Insufficient documentation

## 2015-08-18 DIAGNOSIS — I252 Old myocardial infarction: Secondary | ICD-10-CM | POA: Insufficient documentation

## 2015-08-18 DIAGNOSIS — Z720 Tobacco use: Secondary | ICD-10-CM | POA: Diagnosis present

## 2015-08-18 DIAGNOSIS — I8500 Esophageal varices without bleeding: Secondary | ICD-10-CM | POA: Insufficient documentation

## 2015-08-18 DIAGNOSIS — E119 Type 2 diabetes mellitus without complications: Secondary | ICD-10-CM | POA: Diagnosis not present

## 2015-08-18 DIAGNOSIS — Z7984 Long term (current) use of oral hypoglycemic drugs: Secondary | ICD-10-CM | POA: Diagnosis not present

## 2015-08-18 DIAGNOSIS — K7682 Hepatic encephalopathy: Secondary | ICD-10-CM | POA: Diagnosis present

## 2015-08-18 DIAGNOSIS — F17210 Nicotine dependence, cigarettes, uncomplicated: Secondary | ICD-10-CM | POA: Insufficient documentation

## 2015-08-18 DIAGNOSIS — K7581 Nonalcoholic steatohepatitis (NASH): Secondary | ICD-10-CM

## 2015-08-18 DIAGNOSIS — K7290 Hepatic failure, unspecified without coma: Secondary | ICD-10-CM | POA: Diagnosis not present

## 2015-08-18 DIAGNOSIS — I5031 Acute diastolic (congestive) heart failure: Secondary | ICD-10-CM | POA: Insufficient documentation

## 2015-08-18 DIAGNOSIS — Z7982 Long term (current) use of aspirin: Secondary | ICD-10-CM | POA: Insufficient documentation

## 2015-08-18 DIAGNOSIS — K922 Gastrointestinal hemorrhage, unspecified: Secondary | ICD-10-CM | POA: Diagnosis not present

## 2015-08-18 DIAGNOSIS — E785 Hyperlipidemia, unspecified: Secondary | ICD-10-CM | POA: Diagnosis not present

## 2015-08-18 DIAGNOSIS — R402441 Other coma, without documented Glasgow coma scale score, or with partial score reported, in the field [EMT or ambulance]: Secondary | ICD-10-CM | POA: Diagnosis not present

## 2015-08-18 LAB — CBC WITH DIFFERENTIAL/PLATELET
BASOS ABS: 0 10*3/uL (ref 0.0–0.1)
BASOS PCT: 0 %
EOS ABS: 0.2 10*3/uL (ref 0.0–0.7)
Eosinophils Relative: 5 %
HCT: 35.7 % — ABNORMAL LOW (ref 39.0–52.0)
HEMOGLOBIN: 12.2 g/dL — AB (ref 13.0–17.0)
Lymphocytes Relative: 26 %
Lymphs Abs: 1.2 10*3/uL (ref 0.7–4.0)
MCH: 33.7 pg (ref 26.0–34.0)
MCHC: 34.2 g/dL (ref 30.0–36.0)
MCV: 98.6 fL (ref 78.0–100.0)
Monocytes Absolute: 0.5 10*3/uL (ref 0.1–1.0)
Monocytes Relative: 11 %
NEUTROS ABS: 2.7 10*3/uL (ref 1.7–7.7)
NEUTROS PCT: 58 %
Platelets: 54 10*3/uL — ABNORMAL LOW (ref 150–400)
RBC: 3.62 MIL/uL — AB (ref 4.22–5.81)
RDW: 15.3 % (ref 11.5–15.5)
WBC: 4.7 10*3/uL (ref 4.0–10.5)

## 2015-08-18 LAB — PROTIME-INR
INR: 1.33 (ref 0.00–1.49)
Prothrombin Time: 16.6 seconds — ABNORMAL HIGH (ref 11.6–15.2)

## 2015-08-18 LAB — URINALYSIS, ROUTINE W REFLEX MICROSCOPIC
BILIRUBIN URINE: NEGATIVE
Glucose, UA: NEGATIVE mg/dL
Hgb urine dipstick: NEGATIVE
Ketones, ur: NEGATIVE mg/dL
LEUKOCYTES UA: NEGATIVE
NITRITE: NEGATIVE
Protein, ur: NEGATIVE mg/dL
SPECIFIC GRAVITY, URINE: 1.011 (ref 1.005–1.030)
pH: 6.5 (ref 5.0–8.0)

## 2015-08-18 LAB — COMPREHENSIVE METABOLIC PANEL
ALBUMIN: 2.4 g/dL — AB (ref 3.5–5.0)
ALT: 38 U/L (ref 17–63)
AST: 61 U/L — AB (ref 15–41)
Alkaline Phosphatase: 147 U/L — ABNORMAL HIGH (ref 38–126)
Anion gap: 11 (ref 5–15)
BILIRUBIN TOTAL: 1.8 mg/dL — AB (ref 0.3–1.2)
BUN: 8 mg/dL (ref 6–20)
CALCIUM: 8.9 mg/dL (ref 8.9–10.3)
CHLORIDE: 105 mmol/L (ref 101–111)
CO2: 25 mmol/L (ref 22–32)
CREATININE: 0.72 mg/dL (ref 0.61–1.24)
GFR calc Af Amer: >60 mL/min (ref >60)
GFR calc non Af Amer: >60 mL/min (ref >60)
Glucose, Bld: 111 mg/dL — ABNORMAL HIGH (ref 65–99)
POTASSIUM: 3.7 mmol/L (ref 3.5–5.1)
SODIUM: 141 mmol/L (ref 135–145)
Total Protein: 6.4 g/dL — ABNORMAL LOW (ref 6.5–8.1)

## 2015-08-18 LAB — GLUCOSE, CAPILLARY: Glucose-Capillary: 120 mg/dL — ABNORMAL HIGH (ref 65–99)

## 2015-08-18 LAB — AMMONIA: AMMONIA: 98 umol/L — AB (ref 9–35)

## 2015-08-18 MED ORDER — SODIUM CHLORIDE 0.9% FLUSH
3.0000 mL | Freq: Two times a day (BID) | INTRAVENOUS | Status: DC
Start: 2015-08-18 — End: 2015-08-19
  Administered 2015-08-18 – 2015-08-19 (×2): 3 mL via INTRAVENOUS

## 2015-08-18 MED ORDER — LACTULOSE 10 GM/15ML PO SOLN
30.0000 g | Freq: Three times a day (TID) | ORAL | Status: DC
  Administered 2015-08-19: 30 g via ORAL
  Filled 2015-08-18 (×2): qty 45

## 2015-08-18 MED ORDER — RIFAXIMIN 550 MG PO TABS
550.0000 mg | ORAL_TABLET | Freq: Two times a day (BID) | ORAL | Status: DC
  Administered 2015-08-18 – 2015-08-19 (×2): 550 mg via ORAL
  Filled 2015-08-18 (×2): qty 1

## 2015-08-18 MED ORDER — ATORVASTATIN CALCIUM 10 MG PO TABS
10.0000 mg | ORAL_TABLET | Freq: Every day | ORAL | Status: DC
  Administered 2015-08-19: 10 mg via ORAL
  Filled 2015-08-18: qty 1

## 2015-08-18 MED ORDER — LACTULOSE 10 GM/15ML PO SOLN
30.0000 g | Freq: Three times a day (TID) | ORAL | Status: DC
Start: 2015-08-18 — End: 2015-08-18
  Administered 2015-08-18: 30 g via ORAL
  Filled 2015-08-18: qty 45

## 2015-08-18 MED ORDER — PANTOPRAZOLE SODIUM 40 MG PO TBEC
40.0000 mg | DELAYED_RELEASE_TABLET | Freq: Every day | ORAL | Status: DC
  Administered 2015-08-19: 40 mg via ORAL
  Filled 2015-08-18: qty 1

## 2015-08-18 MED ORDER — SODIUM CHLORIDE 0.9 % IV SOLN
INTRAVENOUS | Status: DC
  Administered 2015-08-18 – 2015-08-19 (×3): via INTRAVENOUS

## 2015-08-18 MED ORDER — METFORMIN HCL 500 MG PO TABS
1000.0000 mg | ORAL_TABLET | Freq: Two times a day (BID) | ORAL | Status: DC
  Administered 2015-08-19: 1000 mg via ORAL
  Filled 2015-08-18: qty 2

## 2015-08-18 MED ORDER — INSULIN ASPART 100 UNIT/ML ~~LOC~~ SOLN
0.0000 [IU] | Freq: Three times a day (TID) | SUBCUTANEOUS | Status: DC
  Administered 2015-08-19: 3 [IU] via SUBCUTANEOUS
  Administered 2015-08-19: 2 [IU] via SUBCUTANEOUS

## 2015-08-18 MED ORDER — RANOLAZINE ER 500 MG PO TB12
500.0000 mg | ORAL_TABLET | Freq: Two times a day (BID) | ORAL | Status: DC
  Administered 2015-08-18 – 2015-08-19 (×2): 500 mg via ORAL
  Filled 2015-08-18 (×2): qty 1

## 2015-08-18 MED ORDER — ADULT MULTIVITAMIN W/MINERALS CH
1.0000 | ORAL_TABLET | Freq: Every day | ORAL | Status: DC
  Administered 2015-08-19: 1 via ORAL
  Filled 2015-08-18: qty 1

## 2015-08-18 MED ORDER — ASPIRIN 81 MG PO CHEW
81.0000 mg | CHEWABLE_TABLET | Freq: Every day | ORAL | Status: DC
  Administered 2015-08-19: 81 mg via ORAL
  Filled 2015-08-18: qty 1

## 2015-08-18 MED ORDER — NITROGLYCERIN 0.4 MG SL SUBL: 0.4000 mg | SUBLINGUAL_TABLET | SUBLINGUAL | Status: DC | PRN

## 2015-08-18 MED ORDER — SODIUM CHLORIDE 0.9 % IV BOLUS (SEPSIS)
500.0000 mL | Freq: Once | INTRAVENOUS | Status: AC
  Administered 2015-08-18: 500 mL via INTRAVENOUS

## 2015-08-18 MED ORDER — NADOLOL 20 MG PO TABS
20.0000 mg | ORAL_TABLET | Freq: Every day | ORAL | Status: DC
  Administered 2015-08-19: 20 mg via ORAL
  Filled 2015-08-18: qty 1

## 2015-08-18 MED ORDER — FUROSEMIDE 40 MG PO TABS
40.0000 mg | ORAL_TABLET | Freq: Every day | ORAL | Status: DC
  Administered 2015-08-19: 40 mg via ORAL
  Filled 2015-08-18: qty 1

## 2015-08-18 MED ORDER — SPIRONOLACTONE 25 MG PO TABS
12.5000 mg | ORAL_TABLET | Freq: Every day | ORAL | Status: DC
  Administered 2015-08-19: 12.5 mg via ORAL
  Filled 2015-08-18: qty 1

## 2015-08-18 NOTE — H&P (Signed)
[facility] History and Physical  Adam Butler YBW:[id number] DOB: August 05, 1955 DOA: 08/18/2015  Referring physician: Christ Kick, MD PCP: [name], NP   Chief Complaint: Altered Mental Status.  HPI: Adam Butler is a 60 y.o. male with a past medical history of cryptogenic liver cirrhosis, hyperlipidemia, CAD (history of MI 76/81), diastolic CHF, hypertension, type 2 DM, SVT, history of non variceal UGI bleed, history of esophageal varices, cholelithiasis who comes to the ED via EMS due to acute AMS.  Per patient's wife, she noticed this morning that he did not have his regular BM in AM despite taking the lactulose. She states he was confused and somnolent, falling to sleep in a chair. He did not finish eating his oatmeal, which was unusual. She states that he ate a hamburger last night, instead of usual fish or chicken.  When seen the patient seemed to have decreased alertness , but was otherwise in NAD.   Review of Systems:  History provided by the patient's wife.  Past Medical History  Diagnosis Date  . Cirrhosis ([place]) 2011    Cryptogenic, Likely NASH. Family/pt deny EtOH. HCV, HBV, HAV negative. ANA negative. AMA positive. Ascites 12/11  . Hyperlipemia   . Coronary artery disease     Inferior MI 12/11; LHC with occluded mid CFX and 80% proximal RCA. EF 55%. He had 3.0 x 28 vision BMS to CFX  . Diastolic CHF, acute ([place])     Echo 12/11 with ef 50-55% and mild LVH. EF 55% by LV0gram in 12/11  . Hypertension   . Type II diabetes mellitus ([place]) 2011  . SVT (supraventricular tachycardia) ([place])     1/12: appeared to be an ectopic atrial tachycardia. Required DCCV with hemodynamic instability  . GI bleed     12/11: Etiology not clearly defined. EGD: nonbleeding esophageal varices.  . S/P coronary artery stent placement 06/2010  . Esophageal varices ([place]) 2011, 2013    no hx acute variceal bleed  . Hepatic encephalopathy ([place]) 2011, 12/2013  . Myocardial  infarction [facility]) ? 2012  . Shortness of breath dyspnea   . Arthritis   . Hx of echocardiogram     Echo 5/16:  EF 55-60%, no RWMA, mod LAE  . Thrombocytopenia ([place])   . Cholelithiasis   . Rectal varices   . PFO (patent foramen ovale): Per TEE 04/20/2015 04/20/2015  . Bacteremia   . Portal hypertension ([place])   . Rectal varices    Past Surgical History  Procedure Laterality Date  . Cardiac catheterization  07/01/2010    BMS to CFX.  Marland Kitchen Appendectomy    . Orif r leg    . Esophagogastroduodenoscopy  04/13/2012    Procedure: ESOPHAGOGASTRODUODENOSCOPY (EGD);  Surgeon: Inda Castle, MD;  Location: [facility];  Service: Endoscopy;  Laterality: N/A;  . Colonoscopy  04/13/2012    Procedure: COLONOSCOPY;  Surgeon: Inda Castle, MD;  Location: [facility] ENDOSCOPY;  Service: Endoscopy;  Laterality: N/A;  . Esophagogastroduodenoscopy N/A 01/01/2014    Procedure: ESOPHAGOGASTRODUODENOSCOPY (EGD);  Surgeon: Jerene Bears, MD;  Location: [facility] ENDOSCOPY;  Service: Endoscopy;  Laterality: N/A;  . Esophageal banding N/A 01/01/2014    Procedure: ESOPHAGEAL BANDING;  Surgeon: Jerene Bears, MD;  Location: [facility] ENDOSCOPY;  Service: Endoscopy;  Laterality: N/A;  . Coronary stent placement  06/30/2010    CFX   Distal        . Left heart catheterization with coronary angiogram N/A 10/15/2014    Procedure: LEFT HEART  CATHETERIZATION WITH CORONARY ANGIOGRAM;  Surgeon: Peter M Martinique, MD;  Location: [facility] CATH LAB;  Service: Cardiovascular;  Laterality: N/A;  . Tee without cardioversion N/A 04/20/2015    Procedure: TRANSESOPHAGEAL ECHOCARDIOGRAM (TEE);  Surgeon: Fay Records, MD;  Location: [facility] ENDOSCOPY;  Service: Cardiovascular;  Laterality: N/A;   Social History:  reports that he has been smoking E-cigarettes.  He has a 36 pack-year smoking history. He has never used smokeless tobacco. He reports that he does not drink alcohol or use illicit drugs.  Allergies  Allergen Reactions  . Isosorbide Other (See Comments)     Nose bleeds  . Lactose Intolerance (Gi) Other (See Comments)    Family History  Problem Relation Age of Onset  . Heart attack Brother 53    MI  . Diabetes Brother   . Heart attack Father   . Diabetes Father   . COPD Father   . COPD Mother   . Stroke Neg Hx   . Heart attack Brother     Prior to Admission medications   Medication Sig Start Date End Date Taking? Authorizing Provider  aspirin 81 MG tablet Take 81 mg by mouth daily.    Yes Historical Provider, MD  atorvastatin (LIPITOR) 10 MG tablet Take 1 tablet (10 mg total) by mouth daily. 07/09/15  Yes [name], NP  furosemide (LASIX) 40 MG tablet Take 1 tablet (40 mg total) by mouth daily. 08/07/15  Yes Jerene Bears, MD  insulin regular (NOVOLIN R,HUMULIN R) 100 units/mL injection According to sliding scale: 151-200: 2 units, 201-250 4 units, 251-300 6 units, 301-350 8 units. If > 400 call me. 07/09/15  Yes [name], NP  lactulose (CHRONULAC) 10 GM/15ML solution Take 45 mLs (30 g total) by mouth 3 (three) times daily. May take an additional dose as needed for bowel movement 08/07/15  Yes Jerene Bears, MD  metFORMIN (GLUCOPHAGE) 1000 MG tablet Take 1,000 mg by mouth 2 (two) times daily. 11/27/14  Yes Historical Provider, MD  Multiple Vitamin (MULTIVITAMIN) capsule Take 1 capsule by mouth daily.     Yes Historical Provider, MD  nadolol (CORGARD) 40 MG tablet Take 0.5 tablets (20 mg total) by mouth daily. 06/30/15  Yes Allie Bossier, MD  nitroGLYCERIN (NITROSTAT) 0.4 MG SL tablet Place 0.4 mg under the tongue every 5 (five) minutes as needed for chest pain (chest pain). Reported on 07/16/2015   Yes Historical Provider, MD  omeprazole (PRILOSEC) 20 MG capsule Take 1 capsule (20 mg total) by mouth daily. 01/15/15  Yes Larey Dresser, MD  ranolazine (RANEXA) 500 MG 12 hr tablet Take 1 tablet (500 mg total) by mouth 2 (two) times daily. 07/09/15  Yes [name], NP  rifaximin (XIFAXAN) 550 MG TABS tablet Take 1 tablet (550 mg total)  by mouth 2 (two) times daily. 08/07/15  Yes Jerene Bears, MD  spironolactone (ALDACTONE) 25 MG tablet Take 0.5 tablets (12.5 mg total) by mouth daily. 08/07/15  Yes Jerene Bears, MD   Physical Exam: Filed Vitals:   08/18/15 1830 08/18/15 1915 08/18/15 1930 08/18/15 2000  BP: 100/78 104/57 102/60 114/63  Pulse: 59 56 57 58  Temp:      TempSrc:      Resp: 14 17 17 15   SpO2: 98% 98% 97% 99%    Wt Readings from Last 3 Encounters:  08/07/15 118.446 kg (261 lb 2 oz)  07/16/15 121.564 kg (268 lb)  07/14/15 119.931 kg (264 lb 6.4  oz)    General:  Appears calm and comfortable Eyes: PERRL, normal lids, irises & conjunctiva ENT: grossly normal hearing, lips & tongue, dentition in poor state of repair. Neck: no LAD, masses or thyromegaly Cardiovascular: RRR, no m/r/g. Trace LE edema. Telemetry: SR, no arrhythmias  Respiratory: CTA bilaterally, no w/r/r. Normal respiratory effort. Abdomen: Obese, mildly distended, soft, mild diffuse tenderness, no guarding. Skin: no rash or induration seen on limited exam Musculoskeletal: grossly normal tone BUE/BLE Psychiatric: grossly normal mood and affect, speech fluent and appropriate Neurologic: Awake, mild decreased in alertness and was oriented x3, but  with difficulty rememberring what date it was. He was grossly non-focal.          Labs on Admission:  Basic Metabolic Panel:  Recent Labs Lab 08/18/15 1730  NA 141  K 3.7  CL 105  CO2 25  GLUCOSE 111*  BUN 8  CREATININE 0.72  CALCIUM 8.9   Liver Function Tests:  Recent Labs Lab 08/18/15 1730  AST 61*  ALT 38  ALKPHOS 147*  BILITOT 1.8*  PROT 6.4*  ALBUMIN 2.4*    Recent Labs Lab 08/18/15 1730  AMMONIA 98*   CBC:  Recent Labs Lab 08/18/15 1730  WBC 4.7  NEUTROABS 2.7  HGB 12.2*  HCT 35.7*  MCV 98.6  PLT 54*    BNP (last 3 results)  Recent Labs  10/14/14 2248 06/05/15 2325 06/27/15 0520  BNP 56.1 37.5 35.3   Echocardiogram  04/17/2015   ------------------------------------------------------------------- LV EF: 55% -  60%  ------------------------------------------------------------------- Indications:   Endocarditis 421.9.  ------------------------------------------------------------------- History:  PMH:  Dyspnea. Coronary artery disease. PMH: Myocardial infarction. Risk factors: Current tobacco use. Diabetes mellitus. Dyslipidemia.  ------------------------------------------------------------------- Study Conclusions  - Left ventricle: The cavity size was normal. Wall thickness was increased in a pattern of mild LVH. Systolic function was normal. The estimated ejection fraction was in the range of 55% to 60%. - Aortic valve: There was trivial regurgitation. - Mitral valve: Calcified annulus. Mildly thickened leaflets . - Left atrium: The atrium was mildly dilated. - Right atrium: The atrium was mildly dilated.  Impressions:  - Poor acoustiic limit study Not adequate to evalaute for endocarditis. REcomm TEE if clinically indicated.  Assessment/Plan Principal Problem:   Hepatic encephalopathy (HCC)   Altered mental status Admit to telemetry. Continue lactulose. Continue rifaximin.  Check ammonia level in AM.  Active Problems:     Liver cirrhosis secondary to NASH As above. Check PT/INR and follow LFT's in AM Continue pantoprazole 40 mg po daily.    Tobacco abuse Declined nicotine replacement therapy.    Cholelithiasis Surgery was not recommemded per patient's wife.    Thrombocytopenia (HCC) Monitor platelet levels.    Anemia Monitor hematocrit and hemoglobin levels.     Type 2 diabetes mellitus (HCC) Continue metformin 1000 mg po BID Carb modified diet. CBG monitoring with RI sliding scale.    CAD Stable. Continue aspirin, Ranexa and beta blocker    Code Status: Full code. DVT Prophylaxis: SCDs Family Communication: His wife was present in the  room. Disposition Plan: Admit for observation, continue lactulose, follow ammonia level.  Time spent: Over 70 minutes were spent during the process of this admission.   Reubin Milan, MD [facility] Pager [phone].

## 2015-08-18 NOTE — Progress Notes (Signed)
Report received. Room ready.

## 2015-08-18 NOTE — ED Notes (Signed)
Pt arrives from home via [facility] rt altered mental status. Pt states he has a hx of high ammonia levels and had lactulose earlier today. Pt is a&ox3, disoriented to time.

## 2015-08-18 NOTE — Patient Outreach (Signed)
[facility]) Care Management  08/18/2015  Adam Butler 09-30-1955 [id number]   Assessment-CSW completed outreach to patient on 08/18/15. Patient immediately did not sound like himself and CSW pointed this out. Patient initially reports that he is doing okay and then stated that he is not feeling that great. Patient reports this is only health related. Patient denies need to go to the ED. Patient also denies needing to contact Alphonsa Overall, [facility] NP. Patient answers every question with "I don't know." Patient reports "I don't know how my brother is doing" since his hospitalization. Patient reports that he has not been in touch with his cousin. CSW suggested that he do so. CSW provided emotional support throughout this conversation. Patient was not receptive to it at this time.  Plan-CSW will update [facility] NP. CSW completed difficult case discussion with [facility] clinical staff this morning. CSW will make outreach next week.  Eula Fried, BSW, MSW, [email or web address] Phone: [phone] Fax: [phone]

## 2015-08-18 NOTE — ED Provider Notes (Signed)
CSN: [id number]     Arrival date & time 08/18/15  1537 History   First MD Initiated Contact with Patient 08/18/15 1621     Chief Complaint  Patient presents with  . Altered Mental Status     (Consider location/radiation/quality/duration/timing/severity/associated sxs/prior Treatment) HPI  Adam Butler is a 60 y.o. male who presents for evaluation of sleepiness and altered mental status. He was seen earlier today at his home by his social worker, who encouraged him to go to the emergency department. Later in the day, his wife apparently called an ambulance, for transport.  Level V caveat- altered mental status   Past Medical History  Diagnosis Date  . Cirrhosis ([place]) 2011    Cryptogenic, Likely NASH. Family/pt deny EtOH. HCV, HBV, HAV negative. ANA negative. AMA positive. Ascites 12/11  . Hyperlipemia   . Coronary artery disease     Inferior MI 12/11; LHC with occluded mid CFX and 80% proximal RCA. EF 55%. He had 3.0 x 28 vision BMS to CFX  . Diastolic CHF, acute ([place])     Echo 12/11 with ef 50-55% and mild LVH. EF 55% by LV0gram in 12/11  . Hypertension   . Type II diabetes mellitus ([place]) 2011  . SVT (supraventricular tachycardia) ([place])     1/12: appeared to be an ectopic atrial tachycardia. Required DCCV with hemodynamic instability  . GI bleed     12/11: Etiology not clearly defined. EGD: nonbleeding esophageal varices.  . S/P coronary artery stent placement 06/2010  . Esophageal varices ([place]) 2011, 2013    no hx acute variceal bleed  . Hepatic encephalopathy ([place]) 2011, 12/2013  . Myocardial infarction [facility]) ? 2012  . Shortness of breath dyspnea   . Arthritis   . Hx of echocardiogram     Echo 5/16:  EF 55-60%, no RWMA, mod LAE  . Thrombocytopenia ([place])   . Cholelithiasis   . Rectal varices   . PFO (patent foramen ovale): Per TEE 04/20/2015 04/20/2015  . Bacteremia   . Portal hypertension ([place])   . Rectal varices    Past Surgical History  Procedure Laterality  Date  . Cardiac catheterization  07/01/2010    BMS to CFX.  Marland Kitchen Appendectomy    . Orif r leg    . Esophagogastroduodenoscopy  04/13/2012    Procedure: ESOPHAGOGASTRODUODENOSCOPY (EGD);  Surgeon: Inda Castle, MD;  Location: [facility] ENDOS[facility];  Service: Endoscopy;  Laterality: N/A;  . Colonoscopy  04/13/2012    Procedure: COLONOSCOPY;  Surgeon: Inda Castle, MD;  Location: [facility] ENDOSCOPY;  Service: Endoscopy;  Laterality: N/A;  . Esophagogastroduodenoscopy N/A 01/01/2014    Procedure: ESOPHAGOGASTRODUODENOSCOPY (EGD);  Surgeon: Jerene Bears, MD;  Location: [facility] ENDOSCOPY;  Service: Endoscopy;  Laterality: N/A;  . Esophageal banding N/A 01/01/2014    Procedure: ESOPHAGEAL BANDING;  Surgeon: Jerene Bears, MD;  Location: [facility] ENDOSCOPY;  Service: Endoscopy;  Laterality: N/A;  . Coronary stent placement  06/30/2010    CFX   Distal        . Left heart catheterization with coronary angiogram N/A 10/15/2014    Procedure: LEFT HEART CATHETERIZATION WITH CORONARY ANGIOGRAM;  Surgeon: Peter M Martinique, MD;  Location[facility] CATH LAB;  Service: Cardiovascular;  Laterality: N/A;  . Tee without cardioversion N/A 04/20/2015    Procedure: TRANSESOPHAGEAL ECHOCARDIOGRAM (TEE);  Surgeon: Fay Records, MD;  Location: [facility] ENDOSCOPY;  Service: Cardiovascular;  Laterality: N/A;   Family History  Problem Relation Age of Onset  . Heart attack Brother  4    MI  . Diabetes Brother   . Heart attack Father   . Diabetes Father   . COPD Father   . COPD Mother   . Stroke Neg Hx   . Heart attack Brother    Social History  Substance Use Topics  . Smoking status: Current Every Day Smoker -- 1.00 packs/day for 36 years    Types: E-cigarettes  . Smokeless tobacco: Never Used     Comment: uses vapor cigarettes (2016 )  . Alcohol Use: No    Review of Systems  Unable to perform ROS: Mental status change      Allergies  Isosorbide and Lactose intolerance (gi)  Home Medications   Prior to Admission medications   Medication  Sig Start Date End Date Taking? Authorizing Provider  aspirin 81 MG tablet Take 81 mg by mouth daily.    Yes Historical Provider, MD  atorvastatin (LIPITOR) 10 MG tablet Take 1 tablet (10 mg total) by mouth daily. 07/09/15  Yes [name], NP  furosemide (LASIX) 40 MG tablet Take 1 tablet (40 mg total) by mouth daily. 08/07/15  Yes Jerene Bears, MD  insulin regular (NOVOLIN R,HUMULIN R) 100 units/mL injection According to sliding scale: 151-200: 2 units, 201-250 4 units, 251-300 6 units, 301-350 8 units. If > 400 call me. 07/09/15  Yes [name], NP  lactulose (CHRONULAC) 10 GM/15ML solution Take 45 mLs (30 g total) by mouth 3 (three) times daily. May take an additional dose as needed for bowel movement 08/07/15  Yes Jerene Bears, MD  metFORMIN (GLUCOPHAGE) 1000 MG tablet Take 1,000 mg by mouth 2 (two) times daily. 11/27/14  Yes Historical Provider, MD  Multiple Vitamin (MULTIVITAMIN) capsule Take 1 capsule by mouth daily.     Yes Historical Provider, MD  nadolol (CORGARD) 40 MG tablet Take 0.5 tablets (20 mg total) by mouth daily. 06/30/15  Yes Allie Bossier, MD  nitroGLYCERIN (NITROSTAT) 0.4 MG SL tablet Place 0.4 mg under the tongue every 5 (five) minutes as needed for chest pain (chest pain). Reported on 07/16/2015   Yes Historical Provider, MD  omeprazole (PRILOSEC) 20 MG capsule Take 1 capsule (20 mg total) by mouth daily. 01/15/15  Yes Larey Dresser, MD  ranolazine (RANEXA) 500 MG 12 hr tablet Take 1 tablet (500 mg total) by mouth 2 (two) times daily. 07/09/15  Yes [name], NP  rifaximin (XIFAXAN) 550 MG TABS tablet Take 1 tablet (550 mg total) by mouth 2 (two) times daily. 08/07/15  Yes Jerene Bears, MD  spironolactone (ALDACTONE) 25 MG tablet Take 0.5 tablets (12.5 mg total) by mouth daily. 08/07/15  Yes Jerene Bears, MD   BP 112/67 mmHg  Pulse 62  Temp(Src) 98.6 F (37 C) (Oral)  Resp 18  SpO2 98% Physical Exam  Constitutional: He is oriented to person, place, and time. He  appears well-developed and well-nourished.  HENT:  Head: Normocephalic and atraumatic.  Right Ear: External ear normal.  Left Ear: External ear normal.  Eyes: Conjunctivae and EOM are normal. Pupils are equal, round, and reactive to light. Right eye exhibits no discharge. Left eye exhibits no discharge. No scleral icterus.  Neck: Normal range of motion and phonation normal. Neck supple.  Cardiovascular: Normal rate, regular rhythm and normal heart sounds.   Pulmonary/Chest: Effort normal and breath sounds normal. He exhibits no bony tenderness.  Abdominal: Soft. There is no tenderness.  Musculoskeletal: Normal range of motion.  2+  pitting edema both legs.  Neurological: He is alert and oriented to person, place, and time. No cranial nerve deficit or sensory deficit. He exhibits normal muscle tone. Coordination normal.  Mild dysarthria and sleepiness. No aphasia or nystagmus.  Skin: Skin is warm, dry and intact.  Psychiatric: He has a normal mood and affect. His behavior is normal. Judgment and thought content normal.  Nursing note and vitals reviewed.   ED Course  Procedures (including critical care time)  Medications  0.9 %  sodium chloride infusion (not administered)  sodium chloride 0.9 % bolus 500 mL (0 mLs Intravenous Stopped 08/18/15 1810)    Patient Vitals for the past 24 hrs:  BP Temp Temp src Pulse Resp SpO2  08/18/15 1730 112/67 mmHg - - 62 18 98 %  08/18/15 1700 92/55 mmHg - - (!) 55 16 95 %  08/18/15 1630 100/56 mmHg - - (!) 57 18 96 %  08/18/15 1545 99/58 mmHg 98.6 F (37 C) Oral (!) 59 18 100 %    6:21 PM Reevaluation with update and discussion. After initial assessment and treatment, an updated evaluation reveals no change in clinical status. Patient's wife is here now and agrees that he is "off" today. Discussed with patient, all questions answered. Jonn Chaikin L   6:22 PM-Consult complete with Dr. Olevia Bowens. Patient case explained and discussed. He agrees to admit  patient for further evaluation and treatment. Call ended at 18:30  Labs Review Labs Reviewed  COMPREHENSIVE METABOLIC PANEL - Abnormal; Notable for the following:    Glucose, Bld 111 (*)    Total Protein 6.4 (*)    Albumin 2.4 (*)    AST 61 (*)    Alkaline Phosphatase 147 (*)    Total Bilirubin 1.8 (*)    All other components within normal limits  CBC WITH DIFFERENTIAL/PLATELET - Abnormal; Notable for the following:    RBC 3.62 (*)    Hemoglobin 12.2 (*)    HCT 35.7 (*)    Platelets 54 (*)    All other components within normal limits  AMMONIA - Abnormal; Notable for the following:    Ammonia 98 (*)    All other components within normal limits  URINALYSIS, ROUTINE W REFLEX MICROSCOPIC (NOT AT [facility])    Imaging Review No results found. I have personally reviewed and evaluated these images and lab results as part of my medical decision-making.   EKG Interpretation None      MDM   Final diagnoses:  Hepatic encephalopathy ([place])    Hepatic Encephalopathy related to chronic liver disease. He is taking lactulose regularly, but has progressively increased ammonia level. Will require patient for further intensive treatment and monitoring.  Nursing Notes Reviewed/ Care Coordinated, and agree without changes. Applicable Imaging Reviewed.  Interpretation of Laboratory Data incorporated into ED treatment  : Admit    Daleen Bo, MD 08/20/15 0021

## 2015-08-18 NOTE — ED Notes (Signed)
Called phlebotomy to draw blood, unable to pull blood off IV.

## 2015-08-18 NOTE — ED Notes (Signed)
Yellow socks and bracelet put on pt.

## 2015-08-19 DIAGNOSIS — K7581 Nonalcoholic steatohepatitis (NASH): Secondary | ICD-10-CM

## 2015-08-19 DIAGNOSIS — D696 Thrombocytopenia, unspecified: Secondary | ICD-10-CM

## 2015-08-19 DIAGNOSIS — K7290 Hepatic failure, unspecified without coma: Principal | ICD-10-CM

## 2015-08-19 DIAGNOSIS — R400 Somnolence: Secondary | ICD-10-CM | POA: Diagnosis not present

## 2015-08-19 LAB — COMPREHENSIVE METABOLIC PANEL
ALK PHOS: 106 U/L (ref 38–126)
ALT: 39 U/L (ref 17–63)
AST: 62 U/L — AB (ref 15–41)
Albumin: 2.3 g/dL — ABNORMAL LOW (ref 3.5–5.0)
Anion gap: 9 (ref 5–15)
BILIRUBIN TOTAL: 1.7 mg/dL — AB (ref 0.3–1.2)
BUN: 7 mg/dL (ref 6–20)
CALCIUM: 8.4 mg/dL — AB (ref 8.9–10.3)
CO2: 24 mmol/L (ref 22–32)
CREATININE: 0.71 mg/dL (ref 0.61–1.24)
Chloride: 106 mmol/L (ref 101–111)
GFR calc Af Amer: >60 mL/min (ref >60)
Glucose, Bld: 129 mg/dL — ABNORMAL HIGH (ref 65–99)
POTASSIUM: 3.5 mmol/L (ref 3.5–5.1)
Sodium: 139 mmol/L (ref 135–145)
TOTAL PROTEIN: 6.1 g/dL — AB (ref 6.5–8.1)

## 2015-08-19 LAB — CBC WITH DIFFERENTIAL/PLATELET
BASOS ABS: 0 10*3/uL (ref 0.0–0.1)
Basophils Relative: 1 %
Eosinophils Absolute: 0.2 10*3/uL (ref 0.0–0.7)
Eosinophils Relative: 5 %
HEMATOCRIT: 33.9 % — AB (ref 39.0–52.0)
Hemoglobin: 11.6 g/dL — ABNORMAL LOW (ref 13.0–17.0)
LYMPHS PCT: 30 %
Lymphs Abs: 1 10*3/uL (ref 0.7–4.0)
MCH: 33.8 pg (ref 26.0–34.0)
MCHC: 34.2 g/dL (ref 30.0–36.0)
MCV: 98.8 fL (ref 78.0–100.0)
MONOS PCT: 14 %
Monocytes Absolute: 0.4 10*3/uL (ref 0.1–1.0)
NEUTROS ABS: 1.6 10*3/uL — AB (ref 1.7–7.7)
Neutrophils Relative %: 51 %
Platelets: 44 10*3/uL — ABNORMAL LOW (ref 150–400)
RBC: 3.43 MIL/uL — ABNORMAL LOW (ref 4.22–5.81)
RDW: 15.5 % (ref 11.5–15.5)
WBC: 3.2 10*3/uL — ABNORMAL LOW (ref 4.0–10.5)

## 2015-08-19 LAB — GLUCOSE, CAPILLARY
GLUCOSE-CAPILLARY: 186 mg/dL — AB (ref 65–99)
Glucose-Capillary: 121 mg/dL — ABNORMAL HIGH (ref 65–99)

## 2015-08-19 LAB — AMMONIA: AMMONIA: 101 umol/L — AB (ref 9–35)

## 2015-08-19 NOTE — Consult Note (Signed)
   [facility] CM Inpatient Consult   08/19/2015  Adam Butler 1956/06/27 [id number] Patient is currently active with [facility] for chronic disease management services. Patient was admitted for observation. Patients Patient has been engaged by a [employer] and CSW.  Our community based plan of care has focused on disease management and community resource support.  Patient will receive a post discharge transition of care call and will be evaluated for monthly home visits for assessments and disease process education.  Made Inpatient Case Manager aware that [facility] following. Of note, [facility] Care Management services does not replace or interfere with any services that are needed or arranged by inpatient case management or social work.  For additional questions or referrals please contact: Natividad Brood, RN BSN [facility] Liaison  [phone] business mobile phone Toll free office [phone]

## 2015-08-19 NOTE — Progress Notes (Signed)
Adam Butler to be D/C'd Home per MD order.  Discussed prescriptions and follow up appointments with the patient. Prescriptions given to patient, medication list explained in detail. Pt verbalized understanding.    Medication List    TAKE these medications        aspirin 81 MG tablet  Take 81 mg by mouth daily.     atorvastatin 10 MG tablet  Commonly known as:  LIPITOR  Take 1 tablet (10 mg total) by mouth daily.     furosemide 40 MG tablet  Commonly known as:  LASIX  Take 1 tablet (40 mg total) by mouth daily.     insulin regular 100 units/mL injection  Commonly known as:  NOVOLIN R,HUMULIN R  According to sliding scale: 151-200: 2 units, 201-250 4 units, 251-300 6 units, 301-350 8 units. If > 400 call me.     lactulose 10 GM/15ML solution  Commonly known as:  CHRONULAC  Take 45 mLs (30 g total) by mouth 3 (three) times daily. May take an additional dose as needed for bowel movement     metFORMIN 1000 MG tablet  Commonly known as:  GLUCOPHAGE  Take 1,000 mg by mouth 2 (two) times daily.     multivitamin capsule  Take 1 capsule by mouth daily.     nadolol 40 MG tablet  Commonly known as:  CORGARD  Take 0.5 tablets (20 mg total) by mouth daily.     nitroGLYCERIN 0.4 MG SL tablet  Commonly known as:  NITROSTAT  Place 0.4 mg under the tongue every 5 (five) minutes as needed for chest pain (chest pain). Reported on 07/16/2015     omeprazole 20 MG capsule  Commonly known as:  PRILOSEC  Take 1 capsule (20 mg total) by mouth daily.     ranolazine 500 MG 12 hr tablet  Commonly known as:  RANEXA  Take 1 tablet (500 mg total) by mouth 2 (two) times daily.     rifaximin 550 MG Tabs tablet  Commonly known as:  XIFAXAN  Take 1 tablet (550 mg total) by mouth 2 (two) times daily.     spironolactone 25 MG tablet  Commonly known as:  ALDACTONE  Take 0.5 tablets (12.5 mg total) by mouth daily.        Filed Vitals:   08/19/15 0902 08/19/15 1025  BP: 118/69 104/54   Pulse: 60 64  Temp: 98.2 F (36.8 C)   Resp: 20     Skin clean, dry and intact without evidence of skin break down, no evidence of skin tears noted. IV catheter discontinued intact. Site without signs and symptoms of complications. Dressing and pressure applied. Pt denies pain at this time. No complaints noted.  An After Visit Summary was printed and given to the patient. Patient escorted via [place], and D/C home via private auto.  Retta Mac BSN, RN

## 2015-08-19 NOTE — Discharge Instructions (Signed)
Hepatic Encephalopathy Hepatic encephalopathy is a loss of brain function from advanced liver disease. The effects of the condition depend on the type of liver damage and how severe it is. In some cases, hepatic encephalopathy can be reversed. CAUSES The exact cause of hepatic encephalopathy is not known. RISK FACTORS You have a higher risk of getting this condition if your liver is damaged. When the liver is damaged harmful substances called toxins can build up in the body. Certain toxins, such as ammonia, can harm your brain. Conditions that can cause liver damage include:  An infection.  Dehydration.  Intestinal bleeding.  Drinking too much alcohol.  Taking certain medicines, including tranquilizers, water pills (diuretics), antidepressants, or sleeping pills. SIGNS AND SYMPTOMS Signs and symptoms may develop suddenly. Or, they may develop slowly and get worse gradually. Symptoms can range from mild to severe. Mild Hepatic Encephalopathy  Mild confusion.  Personality and mood changes.  Anxiety and agitation.  Drowsiness.  Loss of mental abilities.  Musty or sweet-smelling breath. Worsening or Severe Hepatic Encephalopathy  Slowed movement.  Slurred speech.  Extreme personality changes.  Disorientation.  Abnormal shaking or flapping of the hands.  Coma. DIAGNOSIS To make a diagnosis, your health care provider will do a physical exam. To rule out other causes of your signs and symptoms, he or she may order tests. You may have:  Blood tests. These may be done to check your ammonia level, measure how long it takes your blood to clot, and check for infection.  Liver function tests. These may be done to check how well your liver is working.  MRI and CT scans. These may be done to check for a brain disorder.  Electroencephalogram (EEG). This may be done to measure the electrical activity in your brain. TREATMENT The first step in treatment is identifying and  treating possible triggers. The next step is involves taking medicine to lower the level of toxins in the body and to prevent ammonia from building up. You may need to take:  Antibiotics to reduce the ammonia-producing bacteria in your gut.  Lactulose to help flush ammonia from the gut. HOME CARE INSTRUCTIONS Eating and Drinking  Follow a low-protein diet that includes plenty of fruits, vegetables, and whole grains, as directed by your health care provider. Ammonia is produced when you digest high-protein foods.  Work with a Microbiologist or with your health care provider to make sure you are getting the right balance of protein and minerals.  Drink enough fluids to keep your urine clear or pale yellow. Drinking plenty of water helps prevent constipation.  Do not drink alcohol or use illegal drugs. Medicines  Only take medicine as directed by your health care provider.  If you were prescribed an antibiotic medicine, finish it all even if you start to feel better.  Do not start any new medicines, including over-the-counter medicines, without first checking with your health care provider. SEEK MEDICAL CARE IF:  You have new symptoms.  Your symptoms change.  Your symptoms get worse.  You have a fever.  You are constipated.  You have persistent nausea, vomiting, or diarrhea. SEEK IMMEDIATE MEDICAL CARE IF:  You become very confused or drowsy.  You vomit blood or material that looks like coffee grounds.  Your stool is bloody or black or looks like tar.   This information is not intended to replace advice given to you by your health care provider. Make sure you discuss any questions you have with your health care provider.  Document Released: 09/06/2006 Document Revised: 07/18/2014 Document Reviewed: 02/12/2014 Elsevier Interactive Patient Education [employer].

## 2015-08-19 NOTE — Discharge Summary (Signed)
Physician Discharge Summary  Adam Butler NLZ:[id number] DOB: 06/18/1956 DOA: 08/18/2015  PCP: Adam Silversmith, NP  Admit date: 08/18/2015 Discharge date: 08/19/2015  Time spent: 25 minutes  Recommendations for Outpatient Follow-up:  1. Discharge home with outpatient PCP and GI follow-up.   Discharge Diagnoses:  Principal Problem:   Hepatic encephalopathy ([place])   Active Problems:   Tobacco abuse   Cholelithiasis   Thrombocytopenia (H[facility])   Liver cirrhosis secondary to NASH   Altered mental status   Type 2 diabetes mellitus ([place])   Anemia   Discharge Condition: Fair  Diet recommendation: Low sodium  CODE STATUS: Full code  Filed Weights   08/18/15 2241  Weight: 117.5 kg (259 lb 0.7 oz)    History of present illness:  Please refer to admission H&P for details, in brief, 60 year old obese male with history of NASH cirrhosis, hyperlipidemia, CAD with history of MI in 9024, diastolic CHF, hypertension, type 2 diabetes mellitus, SVTs, history of esophageal varices and cholelithiasis who was brought to the ED by EMS for acute change in mental status. As per his wife the patient did not have his regular bowel movement despite taking his lactulose. He was confused and somnolent. He was unable to finish eating his breakfast. In the ED his vitals were stable but was still somnolent. Labs showed chronic anemia and thrombocytopenia. LFTs showed mildly elevated total bilirubin and AST (chronic). Ammonia was 98. Patient admitted to hospital service under observation  Hospital Course:  Acute hepatic encephalopathy Likely in the setting of not having regular bowel movement. Patient has had 2 bowel movements ordered in the hospital and his mental status is back to normal. Continue lactulose 3 times a day as previously recommended to have at least 3 loose bowel movements daily.. Continue rifaximin 550 mg twice a day. He was seen by his gastroenterologist Dr. Hilarie Butler 10 days back and has  follow-up in 3 months.  NASH cirrhosis with portal hypertension Continue Lasix, Aldactone and nadolol. As per GI patient was seen by [facility] liver clinic and transplant was discussed however it was considered that he was not a candidate due to his obesity. End-of-life discussions were brought up but patient said he was not ready for this yet.  Tobacco abuse Counseled strongly on cessation.  Chronic anemia and thrombocytopenia Secondary to underlying cirrhosis. Stable.  Type 2 diabetes mellitus Continue metformin.  Coronary artery disease Stable. Continue aspirin, beta blocker and Ranexa.  Morbid obesity   Patient is clinically stable to be discharged home with outpatient follow-up.   Procedures:  None  Consultations:  None  Discharge Exam: Filed Vitals:   08/19/15 0902 08/19/15 1025  BP: 118/69 104/54  Pulse: 60 64  Temp: 98.2 F (36.8 C)   Resp: 20     General: Middle aged obese male not in distress HEENT: No pallor, no icterus, moist mucosa Chest: Clear bilaterally CVS: Normal S1 and S2, no murmurs GI: Soft, nondistended, nontender, bowel sounds present Musculoskeletal: Warm, no edema CNS: Alert and oriented   Discharge Instructions    Current Discharge Medication List    CONTINUE these medications which have NOT CHANGED   Details  aspirin 81 MG tablet Take 81 mg by mouth daily.     atorvastatin (LIPITOR) 10 MG tablet Take 1 tablet (10 mg total) by mouth daily. Qty: 30 tablet, Refills: 2   Associated Diagnoses: HLD (hyperlipidemia)    furosemide (LASIX) 40 MG tablet Take 1 tablet (40 mg total) by mouth daily. Qty: 30 tablet, Refills:  3    insulin regular (NOVOLIN R,HUMULIN R) 100 units/mL injection According to sliding scale: 151-200: 2 units, 201-250 4 units, 251-300 6 units, 301-350 8 units. If > 400 call me. Qty: 10 mL, Refills: 11   Associated Diagnoses: Type 2 diabetes mellitus without complication, with long-term current use of insulin (HCC)     lactulose (CHRONULAC) 10 GM/15ML solution Take 45 mLs (30 g total) by mouth 3 (three) times daily. May take an additional dose as needed for bowel movement Qty: 1350 mL, Refills: 2    metFORMIN (GLUCOPHAGE) 1000 MG tablet Take 1,000 mg by mouth 2 (two) times daily. Refills: 11    Multiple Vitamin (MULTIVITAMIN) capsule Take 1 capsule by mouth daily.      nadolol (CORGARD) 40 MG tablet Take 0.5 tablets (20 mg total) by mouth daily. Qty: 30 tablet, Refills: 5   Associated Diagnoses: Encephalopathy, hepatic ([place]); Liver cirrhosis secondary to nonalcoholic steatohepatitis (NASH)    nitroGLYCERIN (NITROSTAT) 0.4 MG SL tablet Place 0.4 mg under the tongue every 5 (five) minutes as needed for chest pain (chest pain). Reported on 07/16/2015    omeprazole (PRILOSEC) 20 MG capsule Take 1 capsule (20 mg total) by mouth daily. Qty: 30 capsule, Refills: 6    ranolazine (RANEXA) 500 MG 12 hr tablet Take 1 tablet (500 mg total) by mouth 2 (two) times daily. Qty: 60 tablet, Refills: 2   Associated Diagnoses: Coronary artery disease involving native coronary artery of native heart without angina pectoris    rifaximin (XIFAXAN) 550 MG TABS tablet Take 1 tablet (550 mg total) by mouth 2 (two) times daily. Qty: 24 tablet, Refills: 3    spironolactone (ALDACTONE) 25 MG tablet Take 0.5 tablets (12.5 mg total) by mouth daily. Qty: 30 tablet, Refills: 3       Allergies  Allergen Reactions  . Isosorbide Other (See Comments)    Nose bleeds  . Lactose Intolerance (Gi) Other (See Comments)   Follow-up Information    Follow up with Adam Silversmith, NP. Schedule an appointment as soon as possible for a visit in 1 week.   Specialty:  Internal Medicine   Contact information:   [place] [address] [phone]        The results of significant diagnostics from this hospitalization (including imaging, microbiology, ancillary and laboratory) are listed below for reference.     Significant Diagnostic Studies: No results found.  Microbiology: No results found for this or any previous visit (from the past 240 hour(s)).   Labs: Basic Metabolic Panel:  Recent Labs Lab 08/18/15 1730 08/19/15 0626  NA 141 139  K 3.7 3.5  CL 105 106  CO2 25 24  GLUCOSE 111* 129*  BUN 8 7  CREATININE 0.72 0.71  CALCIUM 8.9 8.4*   Liver Function Tests:  Recent Labs Lab 08/18/15 1730 08/19/15 0626  AST 61* 62*  ALT 38 39  ALKPHOS 147* 106  BILITOT 1.8* 1.7*  PROT 6.4* 6.1*  ALBUMIN 2.4* 2.3*   No results for input(s): LIPASE, AMYLASE in the last 168 hours.  Recent Labs Lab 08/18/15 1730 08/19/15 0626  AMMONIA 98* 101*   CBC:  Recent Labs Lab 08/18/15 1730 08/19/15 0626  WBC 4.7 3.2*  NEUTROABS 2.7 1.6*  HGB 12.2* 11.6*  HCT 35.7* 33.9*  MCV 98.6 98.8  PLT 54* 44*   Cardiac Enzymes: No results for input(s): CKTOTAL, CKMB, CKMBINDEX, TROPONINI in the last 168 hours. BNP: BNP (last 3 results)  Recent Labs  10/14/14 2248 06/05/15 2325 06/27/15 0520  BNP 56.1 37.5 35.3    ProBNP (last 3 results)  Recent Labs  12/11/14 0913  PROBNP 38.0    CBG:  Recent Labs Lab 08/18/15 2240 08/19/15 0815  GLUCAP 120* 121*       Signed:  Louellen Molder MD.  [facility] 08/19/2015, 10:44 AM

## 2015-08-20 ENCOUNTER — Ambulatory Visit: Payer: Self-pay | Admitting: *Deleted

## 2015-08-21 ENCOUNTER — Other Ambulatory Visit: Payer: Self-pay | Admitting: *Deleted

## 2015-08-21 ENCOUNTER — Encounter: Payer: Self-pay | Admitting: *Deleted

## 2015-08-21 ENCOUNTER — Encounter: Payer: Self-pay | Admitting: Licensed Clinical Social Worker

## 2015-08-21 ENCOUNTER — Telehealth: Payer: Self-pay

## 2015-08-21 NOTE — Telephone Encounter (Signed)
Transition Care Management Follow-up Telephone Call   Date discharged? 08/19/2015   How have you been since you were released from the hospital? Health status is improving   Do you understand why you were in the hospital? Yes   Do you understand the discharge instructions? Yes   Where were you discharged to? Home   Items Reviewed:  Medications reviewed: Yes  Allergies reviewed: Yes  Dietary changes reviewed: Yes  Referrals reviewed: Yes   Functional Questionnaire:   Activities of Daily Living (ADLs):   He states they are independent in the following: Yes States they require assistance with the following: None   Any transportation issues/concerns?: YES   Any patient concerns? NO   Confirmed importance and date/time of follow-up visits scheduled YES  Provider Appointment booked with NP Baity on 08/25/15 @ 1:30PM  Confirmed with patient if condition begins to worsen call PCP or go to the ER.  Patient was given the office number and encouraged to call back with question or concerns.  : YES

## 2015-08-21 NOTE — Patient Outreach (Addendum)
S:  Pt home from one day hospital stay. He has managed to stay out of the hospital for 7 weeks, which is a huge milestone. He admits to having eaten a hamburger on Monday and having a lot of stress due to social issues with his brother. Otherwise Adam Butler is following his medial regimen.  O:  BP 104/60 mmHg  Pulse 63  Resp 18  Wt 259 lb (117.482 kg)  SpO2 98% FBS: 103        RRR        Lungs clear        Abdomen with bowel sound active, non tender        Trace pedal edema  A:    Reoccurrence hepatic encephalopathy  P:    Encouraged pt to see his primary care as is desired after each hospitalization to prevent readmission.         I will call weekly for next 3 weeks and see him in a month or prn if necessary.  Adam Butler [facility] [place] [phone]

## 2015-08-25 ENCOUNTER — Other Ambulatory Visit: Payer: Self-pay | Admitting: Licensed Clinical Social Worker

## 2015-08-25 ENCOUNTER — Ambulatory Visit (INDEPENDENT_AMBULATORY_CARE_PROVIDER_SITE_OTHER): Payer: PPO | Admitting: Internal Medicine

## 2015-08-25 ENCOUNTER — Encounter: Payer: Self-pay | Admitting: Internal Medicine

## 2015-08-25 VITALS — BP 110/60 | HR 70 | Temp 98.2°F | Wt 259.0 lb

## 2015-08-25 DIAGNOSIS — K7290 Hepatic failure, unspecified without coma: Secondary | ICD-10-CM

## 2015-08-25 DIAGNOSIS — K7682 Hepatic encephalopathy: Secondary | ICD-10-CM

## 2015-08-25 NOTE — Patient Outreach (Signed)
  [facility]) Care Management  08/25/2015  Adam Butler 06-09-1956 [id number]   Assessment-CSW completed outreach to patient due to recent hospitalization. Patient unable to be reached. HIPPA compliant message left. CSW usually is able to reach patient very easily so will make next outreach within the next few days in order to provide social work support and assistance.  Plan-CSW will complete next outreach on 08/27/15.  Eula Fried, BSW, MSW, [email or web address] Phone: [phone] Fax: [phone]

## 2015-08-25 NOTE — Progress Notes (Signed)
Pre visit review using our clinic review tool, if applicable. No additional management support is needed unless otherwise documented below in the visit note. 

## 2015-08-25 NOTE — Patient Instructions (Signed)
Hepatic Encephalopathy Hepatic encephalopathy is a loss of brain function from advanced liver disease. The effects of the condition depend on the type of liver damage and how severe it is. In some cases, hepatic encephalopathy can be reversed. CAUSES The exact cause of hepatic encephalopathy is not known. RISK FACTORS You have a higher risk of getting this condition if your liver is damaged. When the liver is damaged harmful substances called toxins can build up in the body. Certain toxins, such as ammonia, can harm your brain. Conditions that can cause liver damage include:  An infection.  Dehydration.  Intestinal bleeding.  Drinking too much alcohol.  Taking certain medicines, including tranquilizers, water pills (diuretics), antidepressants, or sleeping pills. SIGNS AND SYMPTOMS Signs and symptoms may develop suddenly. Or, they may develop slowly and get worse gradually. Symptoms can range from mild to severe. Mild Hepatic Encephalopathy  Mild confusion.  Personality and mood changes.  Anxiety and agitation.  Drowsiness.  Loss of mental abilities.  Musty or sweet-smelling breath. Worsening or Severe Hepatic Encephalopathy  Slowed movement.  Slurred speech.  Extreme personality changes.  Disorientation.  Abnormal shaking or flapping of the hands.  Coma. DIAGNOSIS To make a diagnosis, your health care provider will do a physical exam. To rule out other causes of your signs and symptoms, he or she may order tests. You may have:  Blood tests. These may be done to check your ammonia level, measure how long it takes your blood to clot, and check for infection.  Liver function tests. These may be done to check how well your liver is working.  MRI and CT scans. These may be done to check for a brain disorder.  Electroencephalogram (EEG). This may be done to measure the electrical activity in your brain. TREATMENT The first step in treatment is identifying and  treating possible triggers. The next step is involves taking medicine to lower the level of toxins in the body and to prevent ammonia from building up. You may need to take:  Antibiotics to reduce the ammonia-producing bacteria in your gut.  Lactulose to help flush ammonia from the gut. HOME CARE INSTRUCTIONS Eating and Drinking  Follow a low-protein diet that includes plenty of fruits, vegetables, and whole grains, as directed by your health care provider. Ammonia is produced when you digest high-protein foods.  Work with a Microbiologist or with your health care provider to make sure you are getting the right balance of protein and minerals.  Drink enough fluids to keep your urine clear or pale yellow. Drinking plenty of water helps prevent constipation.  Do not drink alcohol or use illegal drugs. Medicines  Only take medicine as directed by your health care provider.  If you were prescribed an antibiotic medicine, finish it all even if you start to feel better.  Do not start any new medicines, including over-the-counter medicines, without first checking with your health care provider. SEEK MEDICAL CARE IF:  You have new symptoms.  Your symptoms change.  Your symptoms get worse.  You have a fever.  You are constipated.  You have persistent nausea, vomiting, or diarrhea. SEEK IMMEDIATE MEDICAL CARE IF:  You become very confused or drowsy.  You vomit blood or material that looks like coffee grounds.  Your stool is bloody or black or looks like tar.   This information is not intended to replace advice given to you by your health care provider. Make sure you discuss any questions you have with your health care provider.  Document Released: 09/06/2006 Document Revised: 07/18/2014 Document Reviewed: 02/12/2014 Elsevier Interactive Patient Education [employer].

## 2015-08-25 NOTE — Progress Notes (Signed)
Subjective:    Patient ID: Adam Butler, male    DOB: [birth year], 60 y.o.   MRN: [id number]  HPI  Pt presents to the clinic today for hospital follow up for hepatic encephalopathy. He presented to the ER 2/7 for AMS. Ammonia was 98. He was admitted for observation. He was discharged 2/8. He has been taking an extra dose of Lactulose every night to ensure he is not retaining ammonia. He has started to wear an adult diaper in case of accidents but he reports this does not bother him. He remains on Xifaxin. He last saw Dr. Hilarie Fredrickson 07/2015- note reviewed. He is having at least 3 BM's per day. He does feel like this most recent episode was stress induced. He is stressed because his brother, daughter, son in law and 2 grandchildren are all living with him. He reports he can never get any rest and never has any time to himself.  Review of Systems      Past Medical History  Diagnosis Date  . Cirrhosis ([place]) 2011    Cryptogenic, Likely NASH. Family/pt deny EtOH. HCV, HBV, HAV negative. ANA negative. AMA positive. Ascites 12/11  . Hyperlipemia   . Coronary artery disease     Inferior MI 12/11; LHC with occluded mid CFX and 80% proximal RCA. EF 55%. He had 3.0 x 28 vision BMS to CFX  . Diastolic CHF, acute ([place])     Echo 12/11 with ef 50-55% and mild LVH. EF 55% by LV0gram in 12/11  . Hypertension   . Type II diabetes mellitus ([place]) 2011  . SVT (supraventricular tachycardia) ([place])     1/12: appeared to be an ectopic atrial tachycardia. Required DCCV with hemodynamic instability  . GI bleed     12/11: Etiology not clearly defined. EGD: nonbleeding esophageal varices.  . S/P coronary artery stent placement 06/2010  . Esophageal varices ([place]) 2011, 2013    no hx acute variceal bleed  . Hepatic encephalopathy ([place]) 2011, 12/2013  . Myocardial infarction [facility]) ? 2012  . Shortness of breath dyspnea   . Arthritis   . Hx of echocardiogram     Echo 5/16:  EF 55-60%, no RWMA, mod LAE  .  Thrombocytopenia ([place])   . Cholelithiasis   . Rectal varices   . PFO (patent foramen ovale): Per TEE 04/20/2015 04/20/2015  . Bacteremia   . Portal hypertension ([place])   . Rectal varices     Current Outpatient Prescriptions  Medication Sig Dispense Refill  . aspirin 81 MG tablet Take 81 mg by mouth daily.     Marland Kitchen atorvastatin (LIPITOR) 10 MG tablet Take 1 tablet (10 mg total) by mouth daily. 30 tablet 2  . furosemide (LASIX) 40 MG tablet Take 1 tablet (40 mg total) by mouth daily. 30 tablet 3  . insulin regular (NOVOLIN R,HUMULIN R) 100 units/mL injection According to sliding scale: 151-200: 2 units, 201-250 4 units, 251-300 6 units, 301-350 8 units. If > 400 call me. 10 mL 11  . lactulose (CHRONULAC) 10 GM/15ML solution Take 45 mLs (30 g total) by mouth 3 (three) times daily. May take an additional dose as needed for bowel movement 1350 mL 2  . metFORMIN (GLUCOPHAGE) 1000 MG tablet Take 1,000 mg by mouth 2 (two) times daily.  11  . Multiple Vitamin (MULTIVITAMIN) capsule Take 1 capsule by mouth daily.      . nadolol (CORGARD) 40 MG tablet Take 0.5 tablets (20 mg total) by mouth daily. 30 tablet  5  . nitroGLYCERIN (NITROSTAT) 0.4 MG SL tablet Place 0.4 mg under the tongue every 5 (five) minutes as needed for chest pain (chest pain). Reported on 08/21/2015    . omeprazole (PRILOSEC) 20 MG capsule Take 1 capsule (20 mg total) by mouth daily. 30 capsule 6  . ranolazine (RANEXA) 500 MG 12 hr tablet Take 1 tablet (500 mg total) by mouth 2 (two) times daily. 60 tablet 2  . rifaximin (XIFAXAN) 550 MG TABS tablet Take 1 tablet (550 mg total) by mouth 2 (two) times daily. 24 tablet 3  . spironolactone (ALDACTONE) 25 MG tablet Take 0.5 tablets (12.5 mg total) by mouth daily. 30 tablet 3   No current facility-administered medications for this visit.    Allergies  Allergen Reactions  . Isosorbide Other (See Comments)    Nose bleeds  . Lactose Intolerance (Gi) Other (See Comments)    Family History   Problem Relation Age of Onset  . Heart attack Brother 50    MI  . Diabetes Brother   . Heart attack Father   . Diabetes Father   . COPD Father   . COPD Mother   . Stroke Neg Hx   . Heart attack Brother     Social History   Social History  . Marital Status: Married    Spouse Name: N/A  . Number of Children: 3  . Years of Education: N/A   Occupational History  . Unemployed Other    Worked in maintenance prior   Social History Main Topics  . Smoking status: Current Every Day Smoker -- 1.00 packs/day for 36 years    Types: E-cigarettes  . Smokeless tobacco: Never Used     Comment: uses vapor cigarettes (2016 )  . Alcohol Use: No  . Drug Use: No  . Sexual Activity: No   Other Topics Concern  . Not on file   Social History Narrative   Married   Gets regular exercise: walking     Constitutional: Pt reports fatigue. Denies fever, malaise, headache or abrupt weight changes.  Respiratory: Denies difficulty breathing, shortness of breath, cough or sputum production.   Cardiovascular: Denies chest pain, chest tightness, palpitations or swelling in the hands or feet.  Gastrointestinal: Pt reports diarrhea. Denies abdominal pain, bloating, constipation, or blood in the stool.  Neurological: Denies dizziness, difficulty with memory, difficulty with speech or problems with balance and coordination.  Psych: Pt reports stress. Denies anxiety, depression, SI/HI.  No other specific complaints in a complete review of systems (except as listed in HPI above).  Objective:   Physical Exam   BP 110/60 mmHg  Pulse 70  Temp(Src) 98.2 F (36.8 C) (Oral)  Wt 259 lb (117.482 kg)  SpO2 98% Wt Readings from Last 3 Encounters:  08/25/15 259 lb (117.482 kg)  08/21/15 259 lb (117.482 kg)  08/18/15 259 lb 0.7 oz (117.5 kg)    General: Appears his stated age, obese in NAD. Cardiovascular: Normal rate and rhythm. S1,S2 noted.  No murmur, rubs or gallops noted. Pulmonary/Chest: Normal  effort and positive vesicular breath sounds. No respiratory distress. No wheezes, rales or ronchi noted.  Abdomen: Soft and nontender. Normal bowel sounds. Neurological: Alert and oriented.  Psychiatric: His behavior is inappropriate today- making sexual comments.  BMET    Component Value Date/Time   NA 139 08/19/2015 0626   K 3.5 08/19/2015 0626   CL 106 08/19/2015 0626   CO2 24 08/19/2015 0626   GLUCOSE 129* 08/19/2015 6168  BUN 7 08/19/2015 0626   CREATININE 0.71 08/19/2015 0626   CREATININE 0.74 03/15/2011 0827   CALCIUM 8.4* 08/19/2015 0626   GFRNONAA >60 08/19/2015 0626   GFRAA >60 08/19/2015 0626    Lipid Panel     Component Value Date/Time   CHOL 117* 06/25/2015 1333   TRIG 134 06/25/2015 1333   HDL 38* 06/25/2015 1333   CHOLHDL 3.1 06/25/2015 1333   VLDL 27 06/25/2015 1333   LDLCALC 52 06/25/2015 1333    CBC    Component Value Date/Time   WBC 3.2* 08/19/2015 0626   RBC 3.43* 08/19/2015 0626   HGB 11.6* 08/19/2015 0626   HCT 33.9* 08/19/2015 0626   PLT 44* 08/19/2015 0626   MCV 98.8 08/19/2015 0626   MCH 33.8 08/19/2015 0626   MCHC 34.2 08/19/2015 0626   RDW 15.5 08/19/2015 0626   LYMPHSABS 1.0 08/19/2015 0626   MONOABS 0.4 08/19/2015 0626   EOSABS 0.2 08/19/2015 0626   BASOSABS 0.0 08/19/2015 0626    Hgb A1C Lab Results  Component Value Date   HGBA1C 6.8* 06/14/2015        Assessment & Plan:   Hospital follow up for hepatic encephalopathy:  Hospital notes, labs reviewed Continue Lactulose, Xifaximin He will follow up with Dr. Hilarie Fredrickson as scheduled  RTC as needed or if symptoms persist or worsen

## 2015-08-28 ENCOUNTER — Other Ambulatory Visit: Payer: Self-pay | Admitting: Licensed Clinical Social Worker

## 2015-08-28 ENCOUNTER — Other Ambulatory Visit: Payer: Self-pay | Admitting: *Deleted

## 2015-08-28 NOTE — Patient Outreach (Signed)
[facility]) Care Management  08/28/2015  ZACKERY BRINE 1955-11-01 [id number]   Assessment-CSW completed second outreach to patient. Patient answered. Patient states "I'm doing" when asked how he was feeling today. Patient is agreeable to CSW completing home visit on 09/01/15.  Plan-CSW will complete home visit in order to provide social work assistance and support.  Eula Fried, BSW, MSW, [email or web address] Phone: [phone] Fax: [phone]

## 2015-08-28 NOTE — Patient Outreach (Signed)
Transition of care call #2 - Pt denies any changes in LOC. He is taking his lactulose faithfully. Glucose levels are running in the normal range. He has an appt with Eula Fried, CSW, for [facility] next week. I will call him again next Friday and I have reminded him to call me if any problems ariise.  Adam Butler [phone]

## 2015-09-01 ENCOUNTER — Encounter: Payer: Self-pay | Admitting: Licensed Clinical Social Worker

## 2015-09-01 ENCOUNTER — Other Ambulatory Visit: Payer: Self-pay | Admitting: Licensed Clinical Social Worker

## 2015-09-01 NOTE — Patient Outreach (Addendum)
[place] [facility]) Care Management  [facility] Social Work  09/01/2015  Adam Butler [birth year] [id number]   Current Medications:  Current Outpatient Prescriptions  Medication Sig Dispense Refill  . aspirin 81 MG tablet Take 81 mg by mouth daily.     Marland Kitchen atorvastatin (LIPITOR) 10 MG tablet Take 1 tablet (10 mg total) by mouth daily. 30 tablet 2  . furosemide (LASIX) 40 MG tablet Take 1 tablet (40 mg total) by mouth daily. 30 tablet 3  . insulin regular (NOVOLIN R,HUMULIN R) 100 units/mL injection According to sliding scale: 151-200: 2 units, 201-250 4 units, 251-300 6 units, 301-350 8 units. If > 400 call me. 10 mL 11  . lactulose (CHRONULAC) 10 GM/15ML solution Take 45 mLs (30 g total) by mouth 3 (three) times daily. May take an additional dose as needed for bowel movement 1350 mL 2  . metFORMIN (GLUCOPHAGE) 1000 MG tablet Take 1,000 mg by mouth 2 (two) times daily.  11  . Multiple Vitamin (MULTIVITAMIN) capsule Take 1 capsule by mouth daily.      . nadolol (CORGARD) 40 MG tablet Take 0.5 tablets (20 mg total) by mouth daily. 30 tablet 5  . nitroGLYCERIN (NITROSTAT) 0.4 MG SL tablet Place 0.4 mg under the tongue every 5 (five) minutes as needed for chest pain (chest pain). Reported on 08/21/2015    . omeprazole (PRILOSEC) 20 MG capsule Take 1 capsule (20 mg total) by mouth daily. 30 capsule 6  . ranolazine (RANEXA) 500 MG 12 hr tablet Take 1 tablet (500 mg total) by mouth 2 (two) times daily. 60 tablet 2  . rifaximin (XIFAXAN) 550 MG TABS tablet Take 1 tablet (550 mg total) by mouth 2 (two) times daily. 24 tablet 3  . spironolactone (ALDACTONE) 25 MG tablet Take 0.5 tablets (12.5 mg total) by mouth daily. 30 tablet 3   No current facility-administered medications for this visit.    Functional Status:  In your present state of health, do you have any difficulty performing the following activities: 08/18/2015 06/14/2015  Hearing? N N  Vision? N N  Difficulty concentrating or  making decisions? N Y  Walking or climbing stairs? N N  Dressing or bathing? N Y  Doing errands, shopping? N N    Fall/Depression Screening:  PHQ 2/9 Scores 09/01/2015 07/22/2015 04/23/2015  PHQ - 2 Score 0 0 4  PHQ- 9 Score - - 8    Assessment: CSW completed home visit with patient on 09/01/15. Spouse present during visit. Patient states that he "did not have a good day yesterday" and spouse confirms this. He states "I had one of my spells again." Spouse reports that he was sluggish, had little energy, become easily confused and that she assumes his ammonia levels were "off." Patient shares that he feels it is because he ate barbeque sauce. Spouse states that he is feeling better but "I can still tell her if off." Patient's brother is currently residing with him and sleeps on couch. Family has assisted brother by applying for Medicaid. They do not feel that they will be able to provide care to him much longer as patient, spouse, daughter, son in law and two grandchildren also reside in house.   Patient shares that he is not experiencing any symptoms of depressive and spouse disagrees. She states that he is "down a lot but won't talk to anyone." Patient lost his mother and sister within the past year. Patient states that spending time with his grandchildren gives him pleasure.  He states that he enjoys camping but that he will need to have someone to go with him and he does not have that option now. Patient reports that he uses humor as a healthy coping skill. CSW educated patient on effective coping methods for depression and complicated grief such as relaxation techniques, deep breathing, implement socialization, finding support through others (cousin), implementing healthy self care methods such as healthy eating, sleep and exercise routines, keeping a journal, join a support group and creating a new hobby. Patient reports that he uses a pedometer to measure how many steps he walks per day. He shares  that he has walked 1,140 by this morning. Patient reports that he has not heard from his cousin who he has previously stated is a positive support for him and someone that he feels comfortable confiding in. CSW encouraged him to contact cousin. Patient reports that he continues to use his healing powers and prayers. Patient feels very accomplished for staying out of the hospital for over 7 weeks. He gives full responsibility to his higher power. Patient reports that he drinks a lot of water and walks when he can (patient went walking after visit.) Patient report that he receives socialization by going out to eat. He states that he takes his brother out to eat for lunch daily and that he went to [address] with his family this week. Patient does not wish to receive mental health resources or grief resources stating "I'm doing fine. I can handle it myself." However, patient is agreeable to setting long term goal of implementing appropriate self care methods in his routine to improve overall physical and mental health.  Patient states that he has not had the chance to get advance directive signed. Patient banks with [employer] and has already contacted bank to see if they could notarize document and received confirmation that he can. Patient is agreeable to 30 day goal of getting document notarized. CSW wrote two reminders for patient and both one on his refrigerator. CSW will provide verbal reminders as well.    Plan: CSW will route encounter to [facility] NP Victorino Sparrow and patient's PCP. CSW will continue to provide emotional support and reflective listening to patient. CSW will work with patient on getting document notarized.  THN CM Care Plan Problem One        Most Recent Value   Care Plan Problem One  Pyschosocial barriers   Role Documenting the Problem One  Clinical Social Worker   Care Plan for Problem One  Active   THN Long Term Goal (31-90 days)  Patient will implement self care methods into  his routine within 90 days   THN Long Term Goal Start Date  09/01/15   Interventions for Problem One Long Term Goal  CSW will provide motivational intervention in encourage patient to implement appropriate self care measures to improve overall mental and physical health   THN CM Short Term Goal #1 (0-30 days)  Patient will have advance directive signed and notarized within one month   THN CM Short Term Goal #1 Start Date  09/01/15   Interventions for Short Term Goal #1  CSW will continue to encourage patient to go to the bank to get document notarized. Patient has left reminders for patient in his residence. Patient will be reminded by calls.       Eula Fried, BSW, MSW, [email or web address] Phone: [phone] Fax: [phone]

## 2015-09-03 ENCOUNTER — Other Ambulatory Visit: Payer: Self-pay | Admitting: Cardiology

## 2015-09-04 ENCOUNTER — Other Ambulatory Visit: Payer: Self-pay | Admitting: *Deleted

## 2015-09-04 NOTE — Patient Outreach (Signed)
Transiton of care call. Adam Butler is doing well. He met one of his goals today. He got his HCPOA and Living Will notorized!!!! He has been following his medical regimen. He reveals his brother is now also living with them. This is an extra strain on him as he also has one adult son, daughter-in-law and their 3 children staying with them.   I encouraged him to get outside and walk on these beautiful days! I will call him again next week and see him on March 9th.  [name] [facility] [place] [phone]

## 2015-09-11 ENCOUNTER — Ambulatory Visit: Payer: Self-pay | Admitting: *Deleted

## 2015-09-11 ENCOUNTER — Other Ambulatory Visit: Payer: Self-pay | Admitting: *Deleted

## 2015-09-11 NOTE — Patient Outreach (Signed)
Transition of care call. Pt denies having any days where he felt like his LOC was altered. He weighs 258 today. His glucose was 128 fasting. He assures me if he has any problems he will call me. We have an appt for a home visit next Thursday at 10:00 am.  Adam Butler [facility] [phone]

## 2015-09-14 ENCOUNTER — Other Ambulatory Visit: Payer: Self-pay | Admitting: Licensed Clinical Social Worker

## 2015-09-14 NOTE — Patient Outreach (Signed)
[place] [facility]) Care Management  09/14/2015  Adam Butler 12-06-1955 [id number]   Assessment-CSW completed outreach to patient on 09/14/15 and patient answered. Patient reports that he successfully completed his goal and had advance directive notarized. CSW provided appraisal for this success. Patient understands that he will need to make copies for document and provide to her health care physician. Patient reports that he is health has improved but he experienced one day that "I did not feel myself." He states that he does not know what caused this. Patient confirms that his brother continues to reside with him and this continues to be stressful for him. CSW suggested that he contact the caseworker at [employer] that he met with to apply for Medicaid for his brother.   Plan-CSW will complete next outreach within two weeks and continue to provide emotional support and social work assistance. Advance directive goal met.  Eula Fried, BSW, MSW, [email or web address] Phone: [phone] Fax: [phone]

## 2015-09-16 ENCOUNTER — Other Ambulatory Visit: Payer: Self-pay

## 2015-09-16 DIAGNOSIS — E785 Hyperlipidemia, unspecified: Secondary | ICD-10-CM

## 2015-09-16 MED ORDER — ATORVASTATIN CALCIUM 10 MG PO TABS: 10.0000 mg | ORAL_TABLET | Freq: Every day | ORAL | Status: DC

## 2015-09-16 NOTE — Telephone Encounter (Signed)
Yes, continue Lipitor

## 2015-09-16 NOTE — Telephone Encounter (Signed)
Last lipid lab showed Chol was low---please advise if pt is to continue medication

## 2015-09-17 ENCOUNTER — Encounter: Payer: Self-pay | Admitting: *Deleted

## 2015-09-17 ENCOUNTER — Other Ambulatory Visit: Payer: Self-pay | Admitting: *Deleted

## 2015-09-17 NOTE — Patient Outreach (Addendum)
[place] [facility]) Care Management  09/17/2015  Adam Butler [birth year] [id number]   Routine home visit.  S:  Pt is lethargic, somewhat confused. Wife is home with him. He reports eating L[place] PUDDING yesterday which is the trigger for his ammonia level to go up. He has taken one dose lactulose already this am. He denies pain. Denies SOB. He has been taking meds as ordered. Pt walks to mailbox everyday (approx 380 steps).  O:  BP 122/70 mmHg  Pulse 67  Resp 18  Wt 266 lb (120.657 kg)  SpO2 97% FBS 148       RRR       Lungs are clear       Neuro:  Able to answer questions. Lethargic.  A:  Altered mentation related to increased ammonia levels/hepatic encephalopathy.      DM  P:  Reinforced that his diet is always the trigger for his ammonia to be elevated and start the cycle where he becomes incoherant. He must not eat beef, organ meats!  Had pt take a dose of laculose. Instructed him to take a 45 mg dose every 1-2 hours.  Encouraged safety precautions to prevent falls.  I will call him later today to follow up.  [name] [facility] [place] [phone]               Called pt at 5:00 pm. He is awake. States he is more alert than this am and he has taken multiple doses of lactulose. He has been hydrating himself also. He has had multiple stools. I reminded him to stick to his diet! I will call him tomorrow.  [name] [facility] [place] [phone]

## 2015-09-21 ENCOUNTER — Other Ambulatory Visit: Payer: Self-pay | Admitting: Internal Medicine

## 2015-09-22 ENCOUNTER — Other Ambulatory Visit: Payer: Self-pay | Admitting: Internal Medicine

## 2015-09-25 ENCOUNTER — Other Ambulatory Visit: Payer: Self-pay | Admitting: *Deleted

## 2015-09-26 ENCOUNTER — Observation Stay (HOSPITAL_COMMUNITY)
Admission: EM | Admit: 2015-09-26 | Discharge: 2015-09-27 | Disposition: A | Discharge status: Home / Self Care | Payer: PPO | Attending: Internal Medicine | Admitting: Internal Medicine

## 2015-09-26 ENCOUNTER — Emergency Department (HOSPITAL_COMMUNITY): Payer: PPO

## 2015-09-26 ENCOUNTER — Observation Stay (HOSPITAL_COMMUNITY): Payer: PPO

## 2015-09-26 ENCOUNTER — Encounter (HOSPITAL_COMMUNITY): Payer: Self-pay | Admitting: Emergency Medicine

## 2015-09-26 DIAGNOSIS — I471 Supraventricular tachycardia: Secondary | ICD-10-CM | POA: Diagnosis not present

## 2015-09-26 DIAGNOSIS — E785 Hyperlipidemia, unspecified: Secondary | ICD-10-CM | POA: Diagnosis present

## 2015-09-26 DIAGNOSIS — I2510 Atherosclerotic heart disease of native coronary artery without angina pectoris: Secondary | ICD-10-CM | POA: Diagnosis present

## 2015-09-26 DIAGNOSIS — F17210 Nicotine dependence, cigarettes, uncomplicated: Secondary | ICD-10-CM | POA: Insufficient documentation

## 2015-09-26 DIAGNOSIS — Z794 Long term (current) use of insulin: Secondary | ICD-10-CM | POA: Diagnosis not present

## 2015-09-26 DIAGNOSIS — I8500 Esophageal varices without bleeding: Secondary | ICD-10-CM | POA: Diagnosis present

## 2015-09-26 DIAGNOSIS — R9431 Abnormal electrocardiogram [ECG] [EKG]: Secondary | ICD-10-CM | POA: Diagnosis not present

## 2015-09-26 DIAGNOSIS — D696 Thrombocytopenia, unspecified: Secondary | ICD-10-CM | POA: Diagnosis present

## 2015-09-26 DIAGNOSIS — I252 Old myocardial infarction: Secondary | ICD-10-CM | POA: Diagnosis not present

## 2015-09-26 DIAGNOSIS — K7460 Unspecified cirrhosis of liver: Secondary | ICD-10-CM | POA: Diagnosis not present

## 2015-09-26 DIAGNOSIS — I340 Nonrheumatic mitral (valve) insufficiency: Secondary | ICD-10-CM | POA: Insufficient documentation

## 2015-09-26 DIAGNOSIS — Z6839 Body mass index (BMI) 39.0-39.9, adult: Secondary | ICD-10-CM | POA: Diagnosis not present

## 2015-09-26 DIAGNOSIS — I110 Hypertensive heart disease with heart failure: Secondary | ICD-10-CM | POA: Diagnosis not present

## 2015-09-26 DIAGNOSIS — I5032 Chronic diastolic (congestive) heart failure: Secondary | ICD-10-CM | POA: Diagnosis present

## 2015-09-26 DIAGNOSIS — E1165 Type 2 diabetes mellitus with hyperglycemia: Secondary | ICD-10-CM | POA: Insufficient documentation

## 2015-09-26 DIAGNOSIS — R402421 Glasgow coma scale score 9-12, in the field [EMT or ambulance]: Secondary | ICD-10-CM | POA: Diagnosis not present

## 2015-09-26 DIAGNOSIS — R4182 Altered mental status, unspecified: Secondary | ICD-10-CM | POA: Diagnosis not present

## 2015-09-26 DIAGNOSIS — K7581 Nonalcoholic steatohepatitis (NASH): Secondary | ICD-10-CM | POA: Diagnosis not present

## 2015-09-26 DIAGNOSIS — K7290 Hepatic failure, unspecified without coma: Principal | ICD-10-CM | POA: Insufficient documentation

## 2015-09-26 DIAGNOSIS — M1990 Unspecified osteoarthritis, unspecified site: Secondary | ICD-10-CM | POA: Diagnosis not present

## 2015-09-26 DIAGNOSIS — IMO0002 Reserved for concepts with insufficient information to code with codable children: Secondary | ICD-10-CM | POA: Diagnosis present

## 2015-09-26 DIAGNOSIS — Q2112 Patent foramen ovale: Secondary | ICD-10-CM

## 2015-09-26 DIAGNOSIS — Z7982 Long term (current) use of aspirin: Secondary | ICD-10-CM | POA: Diagnosis not present

## 2015-09-26 DIAGNOSIS — Q211 Atrial septal defect: Secondary | ICD-10-CM | POA: Insufficient documentation

## 2015-09-26 DIAGNOSIS — I8510 Secondary esophageal varices without bleeding: Secondary | ICD-10-CM | POA: Diagnosis not present

## 2015-09-26 DIAGNOSIS — Z888 Allergy status to other drugs, medicaments and biological substances status: Secondary | ICD-10-CM | POA: Diagnosis not present

## 2015-09-26 DIAGNOSIS — Z7984 Long term (current) use of oral hypoglycemic drugs: Secondary | ICD-10-CM | POA: Diagnosis not present

## 2015-09-26 DIAGNOSIS — Z955 Presence of coronary angioplasty implant and graft: Secondary | ICD-10-CM | POA: Diagnosis not present

## 2015-09-26 DIAGNOSIS — K7682 Hepatic encephalopathy: Secondary | ICD-10-CM | POA: Diagnosis present

## 2015-09-26 LAB — COMPREHENSIVE METABOLIC PANEL
ALK PHOS: 145 U/L — AB (ref 38–126)
ALT: 42 U/L (ref 17–63)
AST: 63 U/L — ABNORMAL HIGH (ref 15–41)
Albumin: 2.6 g/dL — ABNORMAL LOW (ref 3.5–5.0)
Anion gap: 12 (ref 5–15)
BILIRUBIN TOTAL: 1.2 mg/dL (ref 0.3–1.2)
BUN: 9 mg/dL (ref 6–20)
CALCIUM: 9.3 mg/dL (ref 8.9–10.3)
CO2: 23 mmol/L (ref 22–32)
CREATININE: 0.63 mg/dL (ref 0.61–1.24)
Chloride: 106 mmol/L (ref 101–111)
Glucose, Bld: 150 mg/dL — ABNORMAL HIGH (ref 65–99)
Potassium: 4.3 mmol/L (ref 3.5–5.1)
Sodium: 141 mmol/L (ref 135–145)
Total Protein: 6.7 g/dL (ref 6.5–8.1)

## 2015-09-26 LAB — I-STAT ARTERIAL BLOOD GAS, ED
Acid-Base Excess: 1 mmol/L (ref 0.0–2.0)
Bicarbonate: 25.2 mEq/L — ABNORMAL HIGH (ref 20.0–24.0)
O2 SAT: 95 %
TCO2: 26 mmol/L (ref 0–100)
pCO2 arterial: 38 mmHg (ref 35.0–45.0)
pH, Arterial: 7.429 (ref 7.350–7.450)
pO2, Arterial: 71 mmHg — ABNORMAL LOW (ref 80.0–100.0)

## 2015-09-26 LAB — GLUCOSE, CAPILLARY
Glucose-Capillary: 119 mg/dL — ABNORMAL HIGH (ref 65–99)
Glucose-Capillary: 113 mg/dL — ABNORMAL HIGH (ref 65–99)

## 2015-09-26 LAB — CBC WITH DIFFERENTIAL/PLATELET
BASOS ABS: 0 10*3/uL (ref 0.0–0.1)
BASOS PCT: 0 %
EOS ABS: 0.1 10*3/uL (ref 0.0–0.7)
EOS PCT: 3 %
HCT: 37.6 % — ABNORMAL LOW (ref 39.0–52.0)
Hemoglobin: 12.5 g/dL — ABNORMAL LOW (ref 13.0–17.0)
Lymphocytes Relative: 24 %
Lymphs Abs: 1 10*3/uL (ref 0.7–4.0)
MCH: 32.7 pg (ref 26.0–34.0)
MCHC: 33.2 g/dL (ref 30.0–36.0)
MCV: 98.4 fL (ref 78.0–100.0)
Monocytes Absolute: 0.4 10*3/uL (ref 0.1–1.0)
Monocytes Relative: 9 %
Neutro Abs: 2.7 10*3/uL (ref 1.7–7.7)
Neutrophils Relative %: 64 %
PLATELETS: 52 10*3/uL — AB (ref 150–400)
RBC: 3.82 MIL/uL — AB (ref 4.22–5.81)
RDW: 15 % (ref 11.5–15.5)
WBC: 4.2 10*3/uL (ref 4.0–10.5)

## 2015-09-26 LAB — AMMONIA: AMMONIA: 205 umol/L — AB (ref 9–35)

## 2015-09-26 LAB — TROPONIN I

## 2015-09-26 LAB — I-STAT CG4 LACTIC ACID, ED
Lactic Acid, Venous: 2.33 mmol/L (ref 0.5–2.0)
Lactic Acid, Venous: 1.86 mmol/L (ref 0.5–2.0)

## 2015-09-26 LAB — MRSA PCR SCREENING: MRSA BY PCR: NEGATIVE

## 2015-09-26 MED ORDER — INSULIN ASPART 100 UNIT/ML ~~LOC~~ SOLN: 0.0000 [IU] | Freq: Every day | SUBCUTANEOUS | Status: DC

## 2015-09-26 MED ORDER — LACTULOSE ENEMA
300.0000 mL | Freq: Three times a day (TID) | ORAL | Status: DC
  Filled 2015-09-26 (×3): qty 300

## 2015-09-26 MED ORDER — SODIUM CHLORIDE 0.9 % IV SOLN
INTRAVENOUS | Status: DC
  Administered 2015-09-27: 07:00:00 via INTRAVENOUS

## 2015-09-26 MED ORDER — LACTULOSE ENEMA
300.0000 mL | Freq: Once | ORAL | Status: AC
  Administered 2015-09-26: 300 mL via RECTAL
  Filled 2015-09-26: qty 300

## 2015-09-26 MED ORDER — SODIUM CHLORIDE 0.9 % IV SOLN: INTRAVENOUS | Status: DC

## 2015-09-26 MED ORDER — INSULIN ASPART 100 UNIT/ML ~~LOC~~ SOLN
0.0000 [IU] | Freq: Three times a day (TID) | SUBCUTANEOUS | Status: DC
Start: 2015-09-26 — End: 2015-09-27
  Administered 2015-09-27: 1 [IU] via SUBCUTANEOUS

## 2015-09-26 MED ORDER — LACTULOSE ENEMA
300.0000 mL | Freq: Three times a day (TID) | ORAL | Status: DC
  Administered 2015-09-26 – 2015-09-27 (×2): 300 mL via RECTAL
  Filled 2015-09-26 (×3): qty 300

## 2015-09-26 MED ORDER — NALOXONE HCL 0.4 MG/ML IJ SOLN
0.4000 mg | Freq: Once | INTRAMUSCULAR | Status: AC
  Administered 2015-09-26: 0.4 mg via INTRAVENOUS
  Filled 2015-09-26: qty 1

## 2015-09-26 NOTE — ED Notes (Signed)
PCXR in progress

## 2015-09-26 NOTE — ED Notes (Signed)
REtention enema released. Minimal results returned.

## 2015-09-26 NOTE — ED Notes (Signed)
No new bowel results noted.

## 2015-09-26 NOTE — H&P (Signed)
[facility] History and Physical  BING DUFFEY EZM:[id number] DOB: 04-23-1956 DOA: 09/26/2015  Referring physician: Wyvonnia Dusky PCP: [name], NP   Chief Complaint: ams  HPI: Adam Butler is a 60 y.o. male past medical history of liver cirrhosis due to [facility], CAD status post stent placement, GI bleed, esophageal varices, rectal varices, hepatic encephalopathy, thrombocytopenia, PFO, hypertension, diabetes presents to the emergency Department chief complaint of altered mental status. Initial evaluation and insistent with hepatic encephalopathy.  Information is obtained from the chart and the wife who is at the bedside upon presentation. She reported he did in his usual state of health since his last hospitalization early February until this morning she was unable to arouse him. She denies any recent illness fevers nausea vomiting. She reports compliance with medications and diet. No report of any chest pain complaints of palpitation headache dizziness syncope or near-syncope. She states no recent issues with dysuria hematuria frequency or urgency. No coughing.  The emergency department he is provided with a lactulose enema and Narcan. He is afebrile hemodynamically stable but quite obtunded responding to only painful stimuli. He is not hypoxic.   Review of Systems:  10 point review of systems complete and all systems are negative except as indicated in the history of present illness  Past Medical History  Diagnosis Date  . Cirrhosis ([place]) 2011    Cryptogenic, Likely NASH. Family/pt deny EtOH. HCV, HBV, HAV negative. ANA negative. AMA positive. Ascites 12/11  . Hyperlipemia   . Coronary artery disease     Inferior MI 12/11; LHC with occluded mid CFX and 80% proximal RCA. EF 55%. He had 3.0 x 28 vision BMS to CFX  . Diastolic CHF, acute ([place])     Echo 12/11 with ef 50-55% and mild LVH. EF 55% by LV0gram in 12/11  . Hypertension   . Type II diabetes mellitus ([place]) 2011  .  SVT (supraventricular tachycardia) ([place])     1/12: appeared to be an ectopic atrial tachycardia. Required DCCV with hemodynamic instability  . GI bleed     12/11: Etiology not clearly defined. EGD: nonbleeding esophageal varices.  . S/P coronary artery stent placement 06/2010  . Esophageal varices ([place]) 2011, 2013    no hx acute variceal bleed  . Hepatic encephalopathy ([place]) 2011, 12/2013  . Myocardial infarction [facility]) ? 2012  . Shortness of breath dyspnea   . Arthritis   . Hx of echocardiogram     Echo 5/16:  EF 55-60%, no RWMA, mod LAE  . Thrombocytopenia ([place])   . Cholelithiasis   . Rectal varices   . PFO (patent foramen ovale): Per TEE 04/20/2015 04/20/2015  . Bacteremia   . Portal hypertension ([place])   . Rectal varices    Past Surgical History  Procedure Laterality Date  . Cardiac catheterization  07/01/2010    BMS to CFX.  Marland Kitchen Appendectomy    . Orif r leg    . Esophagogastroduodenoscopy  04/13/2012    Procedure: ESOPHAGOGASTRODUODENOSCOPY (EGD);  Surgeon: Inda Castle, MD;  Location: [facility];  Service: Endoscopy;  Laterality: N/A;  . Colonoscopy  04/13/2012    Procedure: COLONOSCOPY;  Surgeon: Inda Castle, MD;  Location: [facility] ENDOSCOPY;  Service: Endoscopy;  Laterality: N/A;  . Esophagogastroduodenoscopy N/A 01/01/2014    Procedure: ESOPHAGOGASTRODUODENOSCOPY (EGD);  Surgeon: Jerene Bears, MD;  Location: [facility] ENDOSCOPY;  Service: Endoscopy;  Laterality: N/A;  . Esophageal banding N/A 01/01/2014    Procedure: ESOPHAGEAL BANDING;  Surgeon: Lajuan Lines Pyrtle,  MD;  Location: [facility] ENDOSCOPY;  Service: Endoscopy;  Laterality: N/A;  . Coronary stent placement  06/30/2010    CFX   Distal        . Left heart catheterization with coronary angiogram N/A 10/15/2014    Procedure: LEFT HEART CATHETERIZATION WITH CORONARY ANGIOGRAM;  Surgeon: Peter M Martinique, MD;  Location: [facility] CATH LAB;  Service: Cardiovascular;  Laterality: N/A;  . Tee without cardioversion N/A 04/20/2015    Procedure:  TRANSESOPHAGEAL ECHOCARDIOGRAM (TEE);  Surgeon: Fay Records, MD;  Location: [facility] ENDOSCOPY;  Service: Cardiovascular;  Laterality: N/A;   Social History:  reports that he has been smoking E-cigarettes.  He has a 36 pack-year smoking history. He has never used smokeless tobacco. He reports that he does not drink alcohol or use illicit drugs.  Allergies  Allergen Reactions  . Isosorbide Other (See Comments)    Nose bleeds    Family History  Problem Relation Age of Onset  . Heart attack Brother 11    MI  . Diabetes Brother   . Heart attack Father   . Diabetes Father   . COPD Father   . COPD Mother   . Stroke Neg Hx   . Heart attack Brother      Prior to Admission medications   Medication Sig Start Date End Date Taking? Authorizing Provider  aspirin 81 MG tablet Take 81 mg by mouth daily.     Historical Provider, MD  atorvastatin (LIPITOR) 10 MG tablet Take 1 tablet (10 mg total) by mouth daily. Patient not taking: Reported on 09/17/2015 09/16/15   [name], NP  furosemide (LASIX) 40 MG tablet Take 1 tablet (40 mg total) by mouth daily. 08/07/15   Jerene Bears, MD  insulin regular (NOVOLIN R,HUMULIN R) 100 units/mL injection According to sliding scale: 151-200: 2 units, 201-250 4 units, 251-300 6 units, 301-350 8 units. If > 400 call me. 07/09/15   [name], NP  lactulose (CHRONULAC) 10 GM/15ML solution TAKE 45 ML BY MOUTH 3 TIMES A DAY. MAY TAKE AN ADDITIONAL DOSE AS NEEDED FOR BOWEL MOVEMENT 09/22/15   Jerene Bears, MD  metFORMIN (GLUCOPHAGE) 1000 MG tablet Take 1,000 mg by mouth 2 (two) times daily. 11/27/14   Historical Provider, MD  Multiple Vitamin (MULTIVITAMIN) capsule Take 1 capsule by mouth daily.      Historical Provider, MD  nadolol (CORGARD) 40 MG tablet Take 0.5 tablets (20 mg total) by mouth daily. 06/30/15   Allie Bossier, MD  nitroGLYCERIN (NITROSTAT) 0.4 MG SL tablet Place 0.4 mg under the tongue every 5 (five) minutes as needed for chest pain (chest pain).  Reported on 08/21/2015    Historical Provider, MD  omeprazole (PRILOSEC) 20 MG capsule TAKE 1 CAPSULE (20 MG TOTAL) BY MOUTH DAILY. 09/03/15   Larey Dresser, MD  ranolazine (RANEXA) 500 MG 12 hr tablet Take 1 tablet (500 mg total) by mouth 2 (two) times daily. 07/09/15   [name], NP  rifaximin (XIFAXAN) 550 MG TABS tablet Take 1 tablet (550 mg total) by mouth 2 (two) times daily. 08/07/15   Jerene Bears, MD  spironolactone (ALDACTONE) 25 MG tablet Take 0.5 tablets (12.5 mg total) by mouth daily. 08/07/15   Jerene Bears, MD   Physical Exam: Filed Vitals:   09/26/15 1230 09/26/15 1245 09/26/15 1315 09/26/15 1330  BP: 108/54 116/55 105/53 94/38  Pulse: 61 61 61 61  Temp:   97.7 F (36.5 C)   TempSrc:  Rectal   Resp: 13 12  14   SpO2: 95% 97% 95% 95%    Wt Readings from Last 3 Encounters:  09/17/15 120.657 kg (266 lb)  08/25/15 117.482 kg (259 lb)  08/21/15 117.482 kg (259 lb)    General:  Appears Quite obtunded moves all extremities Eyes: PERRL, normal lids, irises & conjunctiva ENT: grossly normal hearing, l Neck: no LAD, masses or thyromegaly Cardiovascular: RRR, no m/r/g. No LE edema.  Respiratory: CTA bilaterally but distant, no w/r/r. Normal respiratory effort. Abdomen: soft, ntnd, obese no obvious ascites Skin: no rash or induration seen on limited exam, jaundice Musculoskeletal: grossly normal tone BUE/BLE Psychiatric: grossly normal mood and affect, speech fluent and appropriate Neurologic: Small ones to painful stimuli. Moves all extremities.           Labs on Admission:  Basic Metabolic Panel:  Recent Labs Lab 09/26/15 1146  NA 141  K 4.3  CL 106  CO2 23  GLUCOSE 150*  BUN 9  CREATININE 0.63  CALCIUM 9.3   Liver Function Tests:  Recent Labs Lab 09/26/15 1146  AST 63*  ALT 42  ALKPHOS 145*  BILITOT 1.2  PROT 6.7  ALBUMIN 2.6*   No results for input(s): LIPASE, AMYLASE in the last 168 hours.  Recent Labs Lab 09/26/15 1147  AMMONIA 205*     CBC:  Recent Labs Lab 09/26/15 1146  WBC 4.2  NEUTROABS 2.7  HGB 12.5*  HCT 37.6*  MCV 98.4  PLT 52*   Cardiac Enzymes:  Recent Labs Lab 09/26/15 1146  TROPONINI <0.03    BNP (last 3 results)  Recent Labs  10/14/14 2248 06/05/15 2325 06/27/15 0520  BNP 56.1 37.5 35.3    ProBNP (last 3 results)  Recent Labs  12/11/14 0913  PROBNP 38.0    CBG: No results for input(s): GLUCAP in the last 168 hours.  Radiological Exams on Admission: Ct Head Wo Contrast  09/26/2015  CLINICAL DATA:  Altered mental status. EXAM: CT HEAD WITHOUT CONTRAST TECHNIQUE: Contiguous axial images were obtained from the base of the skull through the vertex without intravenous contrast. COMPARISON:  June 27, 2015 FINDINGS: Mucosal thickening is seen in scattered ethmoid air cells as well as in the right sphenoid sinus. Paranasal sinuses, mastoid air cells, and bones are otherwise unremarkable. Extracranial soft tissue are within normal limits. No subdural, epidural, or subarachnoid hemorrhage identified on today's study. The cerebellum, brainstem, and basal cisterns are normal. No mass, mass effect or midline shift. Ventricles and sulci are unchanged and unremarkable. Scattered white matter changes are again seen as well without obvious change. A small region of low attenuation in the right occipital lobe is stable, likely from previous infarct. No acute cortical infarct identified on this study. IMPRESSION: White matter changes. Probable chronic infarct in the right occipital lobe. No acute ischemia, infarct, or bleed identified. Electronically Signed   By: Dorise Bullion III M.D   On: 09/26/2015 11:15    EKG: Independently reviewed. Normal sinus rhythm Inferior infarct , age undetermined Prolonged QT Abnormal ECG  Assessment/Plan Principal Problem:   Hepatic encephalopathy (HCC) Active Problems:   HLD (hyperlipidemia)   CAD, NATIVE VESSEL   DIASTOLIC HEART FAILURE, CHRONIC    Esophageal varices (HCC)   Type 2 diabetes mellitus, uncontrolled (HCC)   PFO (patent foramen ovale): Per TEE 04/20/2015   Thrombocytopenia ([place])   Liver cirrhosis secondary to NASH  #1. Hepatic encephalopathy. Ammonia level 207, CT of the head without acute changes, no signs of  infection or SBP, wife reports compliance. Home medications include lactulose 45 mils 3 times a day, rifaxamin. Currently quite obtunded but not hypoxic and protecting his airway -Admit to step down for close observation. -Lactulose enema 200 mils every 8 hours -Repeat ammonia level in a.m. -IV fluids gently -Frequent neuro checks -Resume home medications when alert -We'll obtain abdominal ultrasound rule out ascites  #2. Liver cirrhosis secondary to [facility] see above. AST 63, ALT 42, alkaline phosphatase 145 total bili 1.2 -Obtain PT/INR -Recheck compliance of metabolic panel in the a.m. -Continue PPI once alert  #3. Thrombocytopenia. Platelets 52 on admission. Close to his most recent baseline no signs symptoms of bleeding -Monitor  4. Diabetes type 2. Serum glucose 150 on admission -Obtain hemoglobin A1c -Sliding scale insulin for optimal control  #5. Chronic diastolic heart failure. Appears compensated. Echo in October 2016 with an EF 55% mild LVH. I medications include Lasix, Aldactone -Obtain daily weights -Monitor intake and output -Hold home medications until more alert  6. CAD. Appears stable at baseline. EKG without acute changes. -Resume home meds when more alert continue aspirin Ranexa and beta blocker    Code Status: full DVT Prophylaxis: Family Communication: none Disposition Plan: home when ready  Time spent: [facility]

## 2015-09-26 NOTE — ED Notes (Signed)
From home; wife noted hard to arouse on waking. Called EMS. Wife reports typical behavior for when ammonia goes up. Arouses to painful stimuli. VSS.

## 2015-09-26 NOTE — ED Notes (Signed)
Pt moved to back with r arm and bp cuff at side

## 2015-09-26 NOTE — ED Notes (Signed)
Pt answers questions and makes full sentences.

## 2015-09-26 NOTE — ED Provider Notes (Signed)
CSN: [id number]     Arrival date & time 09/26/15  1017 History   First MD Initiated Contact with Patient 09/26/15 1021     Chief Complaint  Patient presents with  . Altered Mental Status     (Consider location/radiation/quality/duration/timing/severity/associated sxs/prior Treatment) HPI Comments: Level V caveat for altered mental status. Patient from home with change in mental status since waking this morning. Last normal last night. History of similar presentation with elevated ammonia levels. History of hepatic encephalopathy due to nonalcoholic cirrhosis. No reported fevers. No reported nausea or vomiting. Patient is obtunded and responds to pain but does not follow commands. Patient also with history of diabetes but blood sugar was normal. No report of any abdominal pain or chest pain. Family not available.  Patient is a 60 y.o. male presenting with altered mental status. The history is provided by the patient and the EMS personnel. The history is limited by the condition of the patient.  Altered Mental Status   Past Medical History  Diagnosis Date  . Cirrhosis ([place]) 2011    Cryptogenic, Likely NASH. Family/pt deny EtOH. HCV, HBV, HAV negative. ANA negative. AMA positive. Ascites 12/11  . Hyperlipemia   . Coronary artery disease     Inferior MI 12/11; LHC with occluded mid CFX and 80% proximal RCA. EF 55%. He had 3.0 x 28 vision BMS to CFX  . Diastolic CHF, acute ([place])     Echo 12/11 with ef 50-55% and mild LVH. EF 55% by LV0gram in 12/11  . Hypertension   . Type II diabetes mellitus ([place]) 2011  . SVT (supraventricular tachycardia) ([place])     1/12: appeared to be an ectopic atrial tachycardia. Required DCCV with hemodynamic instability  . GI bleed     12/11: Etiology not clearly defined. EGD: nonbleeding esophageal varices.  . S/P coronary artery stent placement 06/2010  . Esophageal varices ([place]) 2011, 2013    no hx acute variceal bleed  . Hepatic encephalopathy ([place]) 2011,  12/2013  . Myocardial infarction [facility] ? 2012  . Shortness of breath dyspnea   . Arthritis   . Hx of echocardiogram     Echo 5/16:  EF 55-60%, no RWMA, mod LAE  . Thrombocytopenia ([place])   . Cholelithiasis   . Rectal varices   . PFO (patent foramen ovale): Per TEE 04/20/2015 04/20/2015  . Bacteremia   . Portal hypertension ([place])   . Rectal varices    Past Surgical History  Procedure Laterality Date  . Cardiac catheterization  07/01/2010    BMS to CFX.  Marland Kitchen Appendectomy    . Orif r leg    . Esophagogastroduodenoscopy  04/13/2012    Procedure: ESOPHAGOGASTRODUODENOSCOPY (EGD);  Surgeon: Inda Castle, MD;  Location: [facility];  Service: Endoscopy;  Laterality: N/A;  . Colonoscopy  04/13/2012    Procedure: COLONOSCOPY;  Surgeon: Inda Castle, MD;  Location: [facility] ENDOSCOPY;  Service: Endoscopy;  Laterality: N/A;  . Esophagogastroduodenoscopy N/A 01/01/2014    Procedure: ESOPHAGOGASTRODUODENOSCOPY (EGD);  Surgeon: Jerene Bears, MD;  Location: [facility] ENDOSCOPY;  Service: Endoscopy;  Laterality: N/A;  . Esophageal banding N/A 01/01/2014    Procedure: ESOPHAGEAL BANDING;  Surgeon: Jerene Bears, MD;  Location: [facility] ENDOSCOPY;  Service: Endoscopy;  Laterality: N/A;  . Coronary stent placement  06/30/2010    CFX   Distal        . Left heart catheterization with coronary angiogram N/A 10/15/2014    Procedure: LEFT HEART CATHETERIZATION WITH CORONARY ANGIOGRAM;  Surgeon: Peter M Martinique, MD;  Location: [facility] CATH LAB;  Service: Cardiovascular;  Laterality: N/A;  . Tee without cardioversion N/A 04/20/2015    Procedure: TRANSESOPHAGEAL ECHOCARDIOGRAM (TEE);  Surgeon: Fay Records, MD;  Location: [facility] ENDOSCOPY;  Service: Cardiovascular;  Laterality: N/A;   Family History  Problem Relation Age of Onset  . Heart attack Brother 66    MI  . Diabetes Brother   . Heart attack Father   . Diabetes Father   . COPD Father   . COPD Mother   . Stroke Neg Hx   . Heart attack Brother    Social History  Substance  Use Topics  . Smoking status: Current Every Day Smoker -- 1.00 packs/day for 36 years    Types: E-cigarettes  . Smokeless tobacco: Never Used     Comment: uses vapor cigarettes (2016 )  . Alcohol Use: No    Review of Systems  Unable to perform ROS: Mental status change      Allergies  Isosorbide  Home Medications   Prior to Admission medications   Medication Sig Start Date End Date Taking? Authorizing Provider  aspirin 81 MG tablet Take 81 mg by mouth daily.     Historical Provider, MD  atorvastatin (LIPITOR) 10 MG tablet Take 1 tablet (10 mg total) by mouth daily. Patient not taking: Reported on 09/17/2015 09/16/15   [name], NP  furosemide (LASIX) 40 MG tablet Take 1 tablet (40 mg total) by mouth daily. 08/07/15   Jerene Bears, MD  insulin regular (NOVOLIN R,HUMULIN R) 100 units/mL injection According to sliding scale: 151-200: 2 units, 201-250 4 units, 251-300 6 units, 301-350 8 units. If > 400 call me. 07/09/15   [name], NP  lactulose (CHRONULAC) 10 GM/15ML solution TAKE 45 ML BY MOUTH 3 TIMES A DAY. MAY TAKE AN ADDITIONAL DOSE AS NEEDED FOR BOWEL MOVEMENT 09/22/15   Jerene Bears, MD  metFORMIN (GLUCOPHAGE) 1000 MG tablet Take 1,000 mg by mouth 2 (two) times daily. 11/27/14   Historical Provider, MD  Multiple Vitamin (MULTIVITAMIN) capsule Take 1 capsule by mouth daily.      Historical Provider, MD  nadolol (CORGARD) 40 MG tablet Take 0.5 tablets (20 mg total) by mouth daily. 06/30/15   Allie Bossier, MD  nitroGLYCERIN (NITROSTAT) 0.4 MG SL tablet Place 0.4 mg under the tongue every 5 (five) minutes as needed for chest pain (chest pain). Reported on 08/21/2015    Historical Provider, MD  omeprazole (PRILOSEC) 20 MG capsule TAKE 1 CAPSULE (20 MG TOTAL) BY MOUTH DAILY. 09/03/15   Larey Dresser, MD  ranolazine (RANEXA) 500 MG 12 hr tablet Take 1 tablet (500 mg total) by mouth 2 (two) times daily. 07/09/15   [name], NP  rifaximin (XIFAXAN) 550 MG TABS tablet Take 1  tablet (550 mg total) by mouth 2 (two) times daily. 08/07/15   Jerene Bears, MD  spironolactone (ALDACTONE) 25 MG tablet Take 0.5 tablets (12.5 mg total) by mouth daily. 08/07/15   Jerene Bears, MD   BP 116/57 mmHg  Pulse 59  Temp(Src) 97.7 F (36.5 C) (Rectal)  Resp 15  SpO2 96% Physical Exam  Constitutional: He appears well-developed and well-nourished. No distress.  Morbidly obese, obtunded, responds to pain Protecting airway  HENT:  Head: Normocephalic.  Mouth/Throat: Oropharynx is clear and moist. No oropharyngeal exudate.  Eyes: Conjunctivae and EOM are normal. Pupils are equal, round, and reactive to light.  Neck: Normal range  of motion. Neck supple.  Cardiovascular: Normal rate, regular rhythm and normal heart sounds.   No murmur heard. Pulmonary/Chest: Effort normal and breath sounds normal. No respiratory distress. He exhibits no tenderness.  Abdominal: Soft. There is no tenderness. There is no rebound and no guarding.  Musculoskeletal: Normal range of motion. He exhibits no edema or tenderness.  Neurological:  Obtunded, responds to pain, moves all extremities, does not follow commands.  Skin: Skin is warm.    ED Course  Procedures (including critical care time) Labs Review Labs Reviewed  CBC WITH DIFFERENTIAL/PLATELET - Abnormal; Notable for the following:    RBC 3.82 (*)    Hemoglobin 12.5 (*)    HCT 37.6 (*)    Platelets 52 (*)    All other components within normal limits  COMPREHENSIVE METABOLIC PANEL - Abnormal; Notable for the following:    Glucose, Bld 150 (*)    Albumin 2.6 (*)    AST 63 (*)    Alkaline Phosphatase 145 (*)    All other components within normal limits  AMMONIA - Abnormal; Notable for the following:    Ammonia 205 (*)    All other components within normal limits  I-STAT ARTERIAL BLOOD GAS, ED - Abnormal; Notable for the following:    pO2, Arterial 71.0 (*)    Bicarbonate 25.2 (*)    All other components within normal limits  I-STAT CG4  LACTIC ACID, ED - Abnormal; Notable for the following:    Lactic Acid, Venous 2.33 (*)    All other components within normal limits  TROPONIN I  URINALYSIS, ROUTINE W REFLEX MICROSCOPIC (NOT AT [facility])  COMPREHENSIVE METABOLIC PANEL  CBC  AMMONIA  I-STAT CG4 LACTIC ACID, ED    Imaging Review Ct Head Wo Contrast  09/26/2015  CLINICAL DATA:  Altered mental status. EXAM: CT HEAD WITHOUT CONTRAST TECHNIQUE: Contiguous axial images were obtained from the base of the skull through the vertex without intravenous contrast. COMPARISON:  June 27, 2015 FINDINGS: Mucosal thickening is seen in scattered ethmoid air cells as well as in the right sphenoid sinus. Paranasal sinuses, mastoid air cells, and bones are otherwise unremarkable. Extracranial soft tissue are within normal limits. No subdural, epidural, or subarachnoid hemorrhage identified on today's study. The cerebellum, brainstem, and basal cisterns are normal. No mass, mass effect or midline shift. Ventricles and sulci are unchanged and unremarkable. Scattered white matter changes are again seen as well without obvious change. A small region of low attenuation in the right occipital lobe is stable, likely from previous infarct. No acute cortical infarct identified on this study. IMPRESSION: White matter changes. Probable chronic infarct in the right occipital lobe. No acute ischemia, infarct, or bleed identified. Electronically Signed   By: Dorise Bullion III M.D   On: 09/26/2015 11:15   Dg Chest Portable 1 View  09/26/2015  CLINICAL DATA:  Altered mental status EXAM: PORTABLE CHEST 1 VIEW COMPARISON:  06/27/2015 FINDINGS: Cardiac shadow is mildly enlarged but stable. The lungs are well aerated bilaterally. Patient rotation to the left is noted accentuating the mediastinal markings. No focal infiltrate is seen. IMPRESSION: No acute abnormality noted. Electronically Signed   By: Inez Catalina M.D.   On: 09/26/2015 15:17   I have personally reviewed  and evaluated these images and lab results as part of my medical decision-making.   EKG Interpretation   Date/Time:  Saturday September 26 2015 10:31:37 EDT Ventricular Rate:  66 PR Interval:  142 QRS Duration: 92 QT Interval:  484  QTC Calculation: 507 R Axis:   -15 Text Interpretation:  Normal sinus rhythm Inferior infarct , age  undetermined Prolonged QT Abnormal ECG No significant change was found  Confirmed by Wyvonnia Dusky  MD, Giomar Gusler [phone]) on 09/26/2015 11:25:44 AM      MDM   Final diagnoses:  Hepatic encephalopathy ([place])   Altered mental status. Suspect hepatic encephalopathy. No fever. No report of trauma. Obtunded but protecting airway.  CBG ok.  No response to narcan.  Ammonia 205.  CT head negative.  No CO2 retention on ABG. Not awake enough for PO.  Lactulose enema given.  CXR negative. Afebrile.  No leukocytosis.  Doubt SBP.  Abdomen soft.  Admission for hepatic encephalopathy to stepdown unit d/w NP Black.  Wife states normal state of health yesterday and compliant with meds. No recent illnesses.  CRITICAL CARE Performed by: Ezequiel Essex Total critical care time: 35 minutes Critical care time was exclusive of separately billable procedures and treating other patients. Critical care was necessary to treat or prevent imminent or life-threatening deterioration. Critical care was time spent personally by me on the following activities: development of treatment plan with patient and/or surrogate as well as nursing, discussions with consultants, evaluation of patient's response to treatment, examination of patient, obtaining history from patient or surrogate, ordering and performing treatments and interventions, ordering and review of laboratory studies, ordering and review of radiographic studies, pulse oximetry and re-evaluation of patient's condition.     Ezequiel Essex, MD 09/26/15 1719

## 2015-09-26 NOTE — ED Notes (Signed)
Small amount of stool noted.

## 2015-09-27 DIAGNOSIS — K7290 Hepatic failure, unspecified without coma: Secondary | ICD-10-CM | POA: Diagnosis not present

## 2015-09-27 LAB — COMPREHENSIVE METABOLIC PANEL
ALK PHOS: 95 U/L (ref 38–126)
ALT: 41 U/L (ref 17–63)
ANION GAP: 12 (ref 5–15)
AST: 66 U/L — ABNORMAL HIGH (ref 15–41)
Albumin: 2.5 g/dL — ABNORMAL LOW (ref 3.5–5.0)
BILIRUBIN TOTAL: 1.7 mg/dL — AB (ref 0.3–1.2)
BUN: 10 mg/dL (ref 6–20)
CALCIUM: 9 mg/dL (ref 8.9–10.3)
CO2: 22 mmol/L (ref 22–32)
Chloride: 109 mmol/L (ref 101–111)
Creatinine, Ser: 0.63 mg/dL (ref 0.61–1.24)
GLUCOSE: 112 mg/dL — AB (ref 65–99)
Potassium: 3.6 mmol/L (ref 3.5–5.1)
Sodium: 143 mmol/L (ref 135–145)
TOTAL PROTEIN: 6.2 g/dL — AB (ref 6.5–8.1)

## 2015-09-27 LAB — CBC
HCT: 36 % — ABNORMAL LOW (ref 39.0–52.0)
HEMOGLOBIN: 12 g/dL — AB (ref 13.0–17.0)
MCH: 32.8 pg (ref 26.0–34.0)
MCHC: 33.3 g/dL (ref 30.0–36.0)
MCV: 98.4 fL (ref 78.0–100.0)
Platelets: 58 10*3/uL — ABNORMAL LOW (ref 150–400)
RBC: 3.66 MIL/uL — ABNORMAL LOW (ref 4.22–5.81)
RDW: 15.3 % (ref 11.5–15.5)
WBC: 4.5 10*3/uL (ref 4.0–10.5)

## 2015-09-27 LAB — URINALYSIS, ROUTINE W REFLEX MICROSCOPIC
Bilirubin Urine: NEGATIVE
GLUCOSE, UA: NEGATIVE mg/dL
HGB URINE DIPSTICK: NEGATIVE
Ketones, ur: 15 mg/dL — AB
Leukocytes, UA: NEGATIVE
Nitrite: NEGATIVE
PROTEIN: NEGATIVE mg/dL
SPECIFIC GRAVITY, URINE: 1.025 (ref 1.005–1.030)
pH: 6.5 (ref 5.0–8.0)

## 2015-09-27 LAB — GLUCOSE, CAPILLARY
GLUCOSE-CAPILLARY: 128 mg/dL — AB (ref 65–99)
Glucose-Capillary: 122 mg/dL — ABNORMAL HIGH (ref 65–99)

## 2015-09-27 LAB — AMMONIA: AMMONIA: 70 umol/L — AB (ref 9–35)

## 2015-09-27 MED ORDER — LACTULOSE 10 GM/15ML PO SOLN: ORAL | Status: DC

## 2015-09-27 MED ORDER — RIFAXIMIN 550 MG PO TABS
550.0000 mg | ORAL_TABLET | Freq: Two times a day (BID) | ORAL | Status: DC
  Administered 2015-09-27: 550 mg via ORAL
  Filled 2015-09-27: qty 1

## 2015-09-27 MED ORDER — RANOLAZINE ER 500 MG PO TB12
500.0000 mg | ORAL_TABLET | Freq: Two times a day (BID) | ORAL | Status: DC
  Administered 2015-09-27: 500 mg via ORAL
  Filled 2015-09-27 (×2): qty 1

## 2015-09-27 MED ORDER — LACTULOSE 10 GM/15ML PO SOLN
30.0000 g | Freq: Three times a day (TID) | ORAL | Status: DC
  Administered 2015-09-27: 30 g via ORAL
  Filled 2015-09-27: qty 45

## 2015-09-27 MED ORDER — NADOLOL 20 MG PO TABS
20.0000 mg | ORAL_TABLET | Freq: Every day | ORAL | Status: DC
  Administered 2015-09-27: 20 mg via ORAL
  Filled 2015-09-27: qty 1

## 2015-09-27 MED ORDER — SPIRONOLACTONE 25 MG PO TABS
12.5000 mg | ORAL_TABLET | Freq: Every day | ORAL | Status: DC
  Administered 2015-09-27: 12.5 mg via ORAL
  Filled 2015-09-27: qty 1

## 2015-09-27 MED ORDER — ASPIRIN 81 MG PO TABS
81.0000 mg | ORAL_TABLET | Freq: Every day | ORAL | Status: DC
  Administered 2015-09-27: 81 mg via ORAL
  Filled 2015-09-27 (×2): qty 1

## 2015-09-27 NOTE — Discharge Instructions (Signed)
Hepatic Encephalopathy  Hepatic encephalopathy is a loss of brain function from advanced liver disease. The effects of the condition depend on the type of liver damage and how severe it is. In some cases, hepatic encephalopathy can be reversed. CAUSES The exact cause of hepatic encephalopathy is not known. RISK FACTORS You have a higher risk of getting this condition if your liver is damaged. When the liver is damaged harmful substances called toxins can build up in the body. Certain toxins, such as ammonia, can harm your brain. Conditions that can cause liver damage include:  An infection.  Dehydration.  Intestinal bleeding.  Drinking too much alcohol.  Taking certain medicines, including tranquilizers, water pills (diuretics), antidepressants, or sleeping pills. SIGNS AND SYMPTOMS Signs and symptoms may develop suddenly. Or, they may develop slowly and get worse gradually. Symptoms can range from mild to severe. Mild Hepatic Encephalopathy  Mild confusion.  Personality and mood changes.  Anxiety and agitation.  Drowsiness.  Loss of mental abilities.  Musty or sweet-smelling breath. Worsening or Severe Hepatic Encephalopathy  Slowed movement.  Slurred speech.  Extreme personality changes.  Disorientation.  Abnormal shaking or flapping of the hands.  Coma. DIAGNOSIS To make a diagnosis, your health care provider will do a physical exam. To rule out other causes of your signs and symptoms, he or she may order tests. You may have:  Blood tests. These may be done to check your ammonia level, measure how long it takes your blood to clot, and check for infection.  Liver function tests. These may be done to check how well your liver is working.  MRI and CT scans. These may be done to check for a brain disorder.  Electroencephalogram (EEG). This may be done to measure the electrical activity in your brain. TREATMENT The first step in treatment is identifying and  treating possible triggers. The next step is involves taking medicine to lower the level of toxins in the body and to prevent ammonia from building up. You may need to take:  Antibiotics to reduce the ammonia-producing bacteria in your gut.  Lactulose to help flush ammonia from the gut. HOME CARE INSTRUCTIONS Eating and Drinking  Follow a low-protein diet that includes plenty of fruits, vegetables, and whole grains, as directed by your health care provider. Ammonia is produced when you digest high-protein foods.  Work with a Microbiologist or with your health care provider to make sure you are getting the right balance of protein and minerals.  Drink enough fluids to keep your urine clear or pale yellow. Drinking plenty of water helps prevent constipation.  Do not drink alcohol or use illegal drugs. Medicines  Only take medicine as directed by your health care provider.  If you were prescribed an antibiotic medicine, finish it all even if you start to feel better.  Do not start any new medicines, including over-the-counter medicines, without first checking with your health care provider. SEEK MEDICAL CARE IF:  You have new symptoms.  Your symptoms change.  Your symptoms get worse.  You have a fever.  You are constipated.  You have persistent nausea, vomiting, or diarrhea. SEEK IMMEDIATE MEDICAL CARE IF:  You become very confused or drowsy.  You vomit blood or material that looks like coffee grounds.  Your stool is bloody or black or looks like tar.   This information is not intended to replace advice given to you by your health care provider. Make sure you discuss any questions you have with your health care  provider.   Document Released: 09/06/2006 Document Revised: 07/18/2014 Document Reviewed: 02/12/2014 Elsevier Interactive Patient Education [employer].

## 2015-09-27 NOTE — Progress Notes (Signed)
Discharge Note. Pt and wife at bedside. Pt's wife reports that she will make follow up appointments. Discussion and review of medications done.  When to contact the MD was also reviewed. Pt stated that he is feeling better and ready for discharge.

## 2015-09-27 NOTE — Discharge Summary (Signed)
DISCHARGE SUMMARY  Adam Butler  MR#: [id number]  DOB:Feb 01, 1956  Date of Admission: 09/26/2015 Date of Discharge: 09/27/2015  Attending Physician:Adam Butler  Patient's WUJ:WJXBJ, Adam Butler  Consults:  none  Disposition: D/C home   Follow-up Appts:     Follow-up Information    Follow up with Adam Silversmith, Butler. Schedule an appointment as soon as possible for a visit in 3 days.   Specialty:  Internal Medicine   Contact information:   [place]  [address] [phone]      Tests Needing Follow-up: -monitoring of mental status / compliance w/ lactulose/rifaximin / regular bowel movements   Discharge Diagnoses: Hepatic encephalopathy Cirrhosis secondary to NASH Esophageal varices Thrombocytopenia DM2 Chronic diastolic heart failure CAD - MI Dec 2011 Morbid Obesity - Body mass index is 39.98 kg/(m^2).  Initial presentation: 60 y.o. male w/ history of cirrhosis due to NASH, CAD status post stent placement, esophageal varices, rectal varices, hepatic encephalopathy, thrombocytopenia, PFO, HTN, and DM who presented to the ED c/o altered mental status due to hepatic encephalopathy. He was found to have an ammonia of 205.  Hospital Course: Ammonia 205 at presentation - CT head without acute changes - wife and pt swore compliance w/ meds - ammonia 70 after lactulose enemas and pt alert and conversant - titrate lactulose to assure BM TID - discussed at length with patient that he may need to increase his lactulose dose on days if he has not had a bowel movement by early afternoon - he voiced understanding/demonstrated teach back - all other medical issues reamained stable through the short course of his hospital stay - the pt greatly desired d/c home, and w/ normalization of his mental state and tolerance of oral lactulose and a diet, it was felt that d/c home w/ his wife was reasonably     Medication List    TAKE these medications        aspirin 81 MG tablet  Take 81 mg by mouth daily.     atorvastatin 10 MG tablet  Commonly known as:  LIPITOR  Take 1 tablet (10 mg total) by mouth daily.     furosemide 40 MG tablet  Commonly known as:  LASIX  Take 1 tablet (40 mg total) by mouth daily.     insulin regular 100 units/mL injection  Commonly known as:  NOVOLIN R,HUMULIN R  According to sliding scale: 151-200: 2 units, 201-250 4 units, 251-300 6 units, 301-350 8 units. If > 400 call me.     lactulose 10 GM/15ML solution  Commonly known as:  CHRONULAC  TAKE 45 ML BY MOUTH 3 TIMES A DAY ON SCHEDULE - IF YOU HAVE NOT HAD A BOWEL MOVEMENT BY 1PM ON ANY GIVEN DAY, TAKE AN ADDITIONAL 45ML DOSE TO ASSURE YOU HAVE A BOWEL MOVEMENT AND CONTINUE YOUR SCHEDULED 3X A DAY SCHEDULE ON TOP OF THAT     metFORMIN 1000 MG tablet  Commonly known as:  GLUCOPHAGE  Take 1,000 mg by mouth 2 (two) times daily.     multivitamin capsule  Take 1 capsule by mouth daily.     nadolol 40 MG tablet  Commonly known as:  CORGARD  Take 0.5 tablets (20 mg total) by mouth daily.     nitroGLYCERIN 0.4 MG SL tablet  Commonly known as:  NITROSTAT  Place 0.4 mg under the tongue every 5 (five) minutes as needed for chest pain (chest pain). Reported on 08/21/2015     omeprazole 20 MG  capsule  Commonly known as:  PRILOSEC  TAKE 1 CAPSULE (20 MG TOTAL) BY MOUTH DAILY.     ranolazine 500 MG 12 hr tablet  Commonly known as:  RANEXA  Take 1 tablet (500 mg total) by mouth 2 (two) times daily.     rifaximin 550 MG Tabs tablet  Commonly known as:  XIFAXAN  Take 1 tablet (550 mg total) by mouth 2 (two) times daily.     spironolactone 25 MG tablet  Commonly known as:  ALDACTONE  Take 0.5 tablets (12.5 mg total) by mouth daily.       Day of Discharge BP 109/74 mmHg  Pulse 63  Temp(Src) 97.3 F (36.3 C) (Oral)  Resp 23  Ht 5' 7"  (1.702 m)  Wt 115.8 kg (255 lb 4.7 oz)  BMI 39.98 kg/m2  SpO2 100%  Physical Exam: General: No acute respiratory  distress Lungs: Clear to auscultation bilaterally without wheezes or crackles Cardiovascular: Regular rate and rhythm without murmur gallop or rub normal S1 and S2 Abdomen: Nontender, obese, soft, bowel sounds positive, no rebound, no ascites, no appreciable mass Extremities: No significant cyanosis, clubbing, or edema bilateral lower extremities  Basic Metabolic Panel:  Recent Labs Lab 09/26/15 1146 09/27/15 0517  NA 141 143  K 4.3 3.6  CL 106 109  CO2 23 22  GLUCOSE 150* 112*  BUN 9 10  CREATININE 0.63 0.63  CALCIUM 9.3 9.0   Liver Function Tests:  Recent Labs Lab 09/26/15 1146 09/27/15 0517  AST 63* 66*  ALT 42 41  ALKPHOS 145* 95  BILITOT 1.2 1.7*  PROT 6.7 6.2*  ALBUMIN 2.6* 2.5*    Recent Labs Lab 09/26/15 1147 09/27/15 0517  AMMONIA 205* 70*   CBC:  Recent Labs Lab 09/26/15 1146 09/27/15 0517  WBC 4.2 4.5  NEUTROABS 2.7  --   HGB 12.5* 12.0*  HCT 37.6* 36.0*  MCV 98.4 98.4  PLT 52* 58*    Cardiac Enzymes:  Recent Labs Lab 09/26/15 1146  TROPONINI <0.03   BNP (last 3 results)  Recent Labs  10/14/14 2248 06/05/15 2325 06/27/15 0520  BNP 56.1 37.5 35.3    ProBNP (last 3 results)  Recent Labs  12/11/14 0913  PROBNP 38.0   CBG:  Recent Labs Lab 09/26/15 1757 09/26/15 2152 09/27/15 0837 09/27/15 1306  GLUCAP 119* 113* 122* 128*    Recent Results (from the past 240 hour(s))  MRSA PCR Screening     Status: None   Collection Time: 09/26/15  7:18 PM  Result Value Ref Range Status   MRSA by PCR NEGATIVE NEGATIVE Final    Comment:        The GeneXpert MRSA Assay (FDA approved for NASAL specimens only), is one component of a comprehensive MRSA colonization surveillance program. It is not intended to diagnose MRSA infection nor to guide or monitor treatment for MRSA infections.      Time spent in discharge (includes decision making & examination of pt): 30 minutes  09/27/2015, 2:50 PM   Cherene Altes,  MD [facility] Office  [phone] Pager [phone]  On-Call/Text Page:      [email or web address]      password [facility]

## 2015-09-27 NOTE — Progress Notes (Signed)
TEAM 1 - Stepdown/ICU TEAM PROGRESS NOTE  BALEN WOOLUM JOA:[id number] DOB: 01/05/1956 DOA: 09/26/2015 PCP: [name], NP   Admit HPI / Brief Narrative: 60 y.o. male w/ history of cirrhosis due to NASH, CAD status post stent placement, esophageal varices, rectal varices, hepatic encephalopathy, thrombocytopenia, PFO, HTN, and DM who presented to the ED c/o altered mental status due to hepatic encephalopathy.  He was found to have an ammonia of 205.  HPI/Subjective: The patient is alert oriented and conversant.  He swears that he is compliant with his lactulose at home but does admit he has days when he does not have consistent bowel movements.  He denies chest pain shortness breath fevers chills.  He is highly motivated to be discharged home.  He has not yet had a regular diet.  He is having a very liquid stool at the time that I am examining him.  Assessment/Plan:  Hepatic encephalopathy Ammonia 205 at presentation - CT head without acute changes - wife reports compliance w/ meds - ammonia now 70 and pt alert and conversant - titrate lactulose to assure BM TID - discussed at length with patient that he may need to increase his lactulose dose on days if he has not had a bowel movement by early afternoon - he voiced understanding/demonstrated teach back  Cirrhosis secondary to NASH Followed by Dr. Hilarie Fredrickson at [facility]   Esophageal varices No evidence of bleeding  Thrombocytopenia plt count stable - no evidence of spontaneous bleeding   DM2 A1c pending  Chronic diastolic heart failure TTE October 2016 with EF 55% mild LVH  CAD - MI Dec 2011 S/p BMS to CFX   Morbid Obesity - Body mass index is 39.98 kg/(m^2).  Code Status: FULL Family Communication: no family present at time of exam Disposition Plan: Possible discharge home later today if tolerates regular diet and mental status remains  stable  Consultants: none  Procedures: none  Antibiotics: none  DVT prophylaxis: SCDs  Objective: Blood pressure 108/49, pulse 68, temperature 97.9 F (36.6 C), temperature source Oral, resp. rate 18, height 5' 7"  (1.702 m), weight 115.8 kg (255 lb 4.7 oz), SpO2 97 %.  Intake/Output Summary (Last 24 hours) at 09/27/15 0940 Last data filed at 09/27/15 0107  Gross per 24 hour  Intake      0 ml  Output    253 ml  Net   -253 ml   Exam: General: No acute respiratory distress Lungs: Clear to auscultation bilaterally without wheezes or crackles Cardiovascular: Regular rate and rhythm without murmur gallop or rub normal S1 and S2 Abdomen: Nontender, obese, soft, bowel sounds positive, no rebound, no ascites, no appreciable mass Extremities: No significant cyanosis, clubbing, or edema bilateral lower extremities  Data Reviewed:  Basic Metabolic Panel:  Recent Labs Lab 09/26/15 1146 09/27/15 0517  NA 141 143  K 4.3 3.6  CL 106 109  CO2 23 22  GLUCOSE 150* 112*  BUN 9 10  CREATININE 0.63 0.63  CALCIUM 9.3 9.0    CBC:  Recent Labs Lab 09/26/15 1146 09/27/15 0517  WBC 4.2 4.5  NEUTROABS 2.7  --   HGB 12.5* 12.0*  HCT 37.6* 36.0*  MCV 98.4 98.4  PLT 52* 58*    Liver Function Tests:  Recent Labs Lab 09/26/15 1146 09/27/15 0517  AST 63* 66*  ALT 42 41  ALKPHOS 145* 95  BILITOT 1.2 1.7*  PROT 6.7 6.2*  ALBUMIN 2.6* 2.5*    Recent Labs Lab 09/26/15  1147 09/27/15 0517  AMMONIA 205* 70*   Cardiac Enzymes:  Recent Labs Lab 09/26/15 1146  TROPONINI <0.03    CBG:  Recent Labs Lab 09/26/15 1757 09/26/15 2152 09/27/15 0837  GLUCAP 119* 113* 122*    Recent Results (from the past 240 hour(s))  MRSA PCR Screening     Status: None   Collection Time: 09/26/15  7:18 PM  Result Value Ref Range Status   MRSA by PCR NEGATIVE NEGATIVE Final    Comment:        The GeneXpert MRSA Assay (FDA approved for NASAL specimens only), is one component  of a comprehensive MRSA colonization surveillance program. It is not intended to diagnose MRSA infection nor to guide or monitor treatment for MRSA infections.      Studies:   Recent x-ray studies have been reviewed in detail by the Attending Physician  Scheduled Meds:  Scheduled Meds: . aspirin  81 mg Oral Daily  . insulin aspart  0-5 Units Subcutaneous QHS  . insulin aspart  0-9 Units Subcutaneous TID WC  . lactulose  30 g Oral TID  . nadolol  20 mg Oral Daily  . ranolazine  500 mg Oral BID  . rifaximin  550 mg Oral BID  . spironolactone  12.5 mg Oral Daily    Time spent on care of this patient: 35 mins   Fayez Sturgell T , MD   [facility] Office  [phone] Pager - Text Page per Shea Evans as per below:  On-Call/Text Page:      [email or web address]      password [email or web address]  If 7PM-7AM, please contact night-coverage [email or web address] Password [email or web address] 09/27/2015, 9:40 AM   LOS: 1 day

## 2015-09-28 ENCOUNTER — Ambulatory Visit (INDEPENDENT_AMBULATORY_CARE_PROVIDER_SITE_OTHER): Payer: PPO | Admitting: Internal Medicine

## 2015-09-28 ENCOUNTER — Encounter: Payer: Self-pay | Admitting: Internal Medicine

## 2015-09-28 ENCOUNTER — Other Ambulatory Visit: Payer: Self-pay | Admitting: Licensed Clinical Social Worker

## 2015-09-28 ENCOUNTER — Telehealth: Payer: Self-pay

## 2015-09-28 VITALS — BP 110/64 | HR 72 | Ht 67.0 in | Wt 260.2 lb

## 2015-09-28 DIAGNOSIS — K7581 Nonalcoholic steatohepatitis (NASH): Secondary | ICD-10-CM | POA: Diagnosis not present

## 2015-09-28 DIAGNOSIS — K766 Portal hypertension: Secondary | ICD-10-CM

## 2015-09-28 DIAGNOSIS — K7290 Hepatic failure, unspecified without coma: Secondary | ICD-10-CM | POA: Diagnosis not present

## 2015-09-28 DIAGNOSIS — K7460 Unspecified cirrhosis of liver: Secondary | ICD-10-CM | POA: Diagnosis not present

## 2015-09-28 DIAGNOSIS — K7682 Hepatic encephalopathy: Secondary | ICD-10-CM

## 2015-09-28 MED ORDER — NADOLOL 40 MG PO TABS: 40.0000 mg | ORAL_TABLET | Freq: Every day | ORAL | Status: DC

## 2015-09-28 MED ORDER — RIFAXIMIN 550 MG PO TABS: 550.0000 mg | ORAL_TABLET | Freq: Two times a day (BID) | ORAL | Status: DC

## 2015-09-28 MED ORDER — LACTULOSE 10 GM/15ML PO SOLN: ORAL | Status: DC

## 2015-09-28 NOTE — Patient Outreach (Signed)
[facility]) Care Management  09/28/2015  Adam Butler 1956/02/20 [id number]   Assessment-CSW completed outreach to patient. Patient was admitted into the hospital for altered mental status and increased ammonia level. Patient reports that he discharge from hospital yesterday afternoon. He shares that he feels "a lot better" and is "trying to take it easy and get some rest." Patient reports taking medications as prescribed. Patient reports that he is trying to implement healthy eating habits into his routine. Patient reminded to schedule follow up appointments with physicians. Patient reports that he is working on this. Patient reminded to take advance directive to PCP appointment to provide copy.   Plan-CSW will complete next outreach within two weeks. CSW will update [name].  Adam Butler, BSW, MSW, [email or web address] Phone: [phone] Fax: [phone]

## 2015-09-28 NOTE — Patient Outreach (Signed)
Telephone call - follow up. Pt reports he is doing fine. He does admit to another day when he felt like his ammonia level went up after eating fried chicken. He was able to treat himself with more frequent doses of lactulose without having to go to the hospital. I praised him for treating himself accordingly and we discussed the need for him to remember what foods have triggered an elevation in his ammonia. He says he understands. I will call him again next week.  [name] [facility] [phone]

## 2015-09-28 NOTE — Progress Notes (Signed)
Subjective:    Patient ID: Adam Butler, male    DOB: 1956-01-18, 60 y.o.   MRN: [id number]  HPI Angelo Prindle is a 60 year old male with NASH cirrhosis complicated by portal hypertension with hepatic encephalopathy, small esophageal and rectal varices on beta blocker, history of ascites and thrombocytopenia who is here for follow-up. He was last seen 2 months ago and presents today with his wife and 3 young grandchildren.  Unfortunately he was seen in the ER over the weekend with hepatic encephalopathy. He was admitted and found to have markedly elevated ammonia. This was treated with rectal lactulose enemas. His wife states that on Monday and Thursday of last week she noticed that he was more sleepy and more difficult to wake up in the morning and slightly more agitated. This was despite taking rifaximin 550 twice a day and lactulose 3 times a day. On Saturday, 09/26/2015 she could not wake him from sleep and called 911. He was transported to the emergency room and admitted. Ultrasound of the abdomen was unremarkable with no ascites. He denies melena or rectal bleeding. Has been having bowel movements 2-3 times on most days. No fever cough, dysuria. No new medicines. Trying to eat a low-sodium diet. Continues low-dose Lasix and Spiriva lactone. Continues nadolol 20 mg daily.  Review of Systems As per history of present illness, otherwise negative  Current Medications, Allergies, Past Medical History, Past Surgical History, Family History and Social History were reviewed in [employer] record.     Objective:   Physical Exam BP 110/64 mmHg  Pulse 72  Ht 5' 7"  (1.702 m)  Wt 260 lb 4 oz (118.049 kg)  BMI 40.75 kg/m2 Constitutional: Well-developed and well-nourished. No distress. HEENT: Normocephalic and atraumatic. Oropharynx is clear and moist. No oropharyngeal exudate. Conjunctivae are normal.  No scleral icterus. Neck: Neck supple. Trachea  midline. Cardiovascular: Normal rate, regular rhythm and intact distal pulses.  Pulmonary/chest: Effort normal and breath sounds normal. No wheezing, rales or rhonchi. Abdominal: Soft, obese, nontender, nondistended. Bowel sounds active throughout.  Extremities: no clubbing, cyanosis, or edema Neurological: Alert and oriented to person place and time.No asterixis Skin: Skin is warm and dry.  Psychiatric: Normal mood and affect. Behavior is normal.  Abd [place] -- 2 days ago, reviewed, no ascites  CBC    Component Value Date/Time   WBC 4.5 09/27/2015 0517   RBC 3.66* 09/27/2015 0517   HGB 12.0* 09/27/2015 0517   HCT 36.0* 09/27/2015 0517   PLT 58* 09/27/2015 0517   MCV 98.4 09/27/2015 0517   MCH 32.8 09/27/2015 0517   MCHC 33.3 09/27/2015 0517   RDW 15.3 09/27/2015 0517   LYMPHSABS 1.0 09/26/2015 1146   MONOABS 0.4 09/26/2015 1146   EOSABS 0.1 09/26/2015 1146   BASOSABS 0.0 09/26/2015 1146    CMP     Component Value Date/Time   NA 143 09/27/2015 0517   K 3.6 09/27/2015 0517   CL 109 09/27/2015 0517   CO2 22 09/27/2015 0517   GLUCOSE 112* 09/27/2015 0517   BUN 10 09/27/2015 0517   CREATININE 0.63 09/27/2015 0517   CREATININE 0.74 03/15/2011 0827   CALCIUM 9.0 09/27/2015 0517   PROT 6.2* 09/27/2015 0517   ALBUMIN 2.5* 09/27/2015 0517   AST 66* 09/27/2015 0517   ALT 41 09/27/2015 0517   ALKPHOS 95 09/27/2015 0517   BILITOT 1.7* 09/27/2015 0517   GFRNONAA >60 09/27/2015 0517   GFRAA >60 09/27/2015 0517    Lab Results  Component Value Date   INR 1.33 08/18/2015   INR 1.18 06/27/2015   INR 1.34 06/14/2015      Assessment & Plan:  60 year old male with NASH cirrhosis complicated by portal hypertension with hepatic encephalopathy, small esophageal and rectal varices on beta blocker, history of ascites and thrombocytopenia who is here for follow-up.  1. NASH cirrhosis with portal hypertension and hepatic encephalopathy -- unfortunately despite maximal therapy he was  hospitalized again recently for hepatic encephalopathy. We are currently treated with rifaximin 550 mg twice a day which we will continue. He will continue the lactulose 45 mL dose 3 times daily. His wife is very involved and knows to give him an additional dose or 2 as needed should he develop sleepiness, confusion or combativeness. If it progresses she knows to call 911. No evidence of any other precipitating event such as infection, ascites, new medication, etc. Continue low-sodium diet and alcohol avoidance. --Lactulose 3 times a day with additional doses as necessary. He knows that he should have at least 2-3 soft but formed bowel movements per day. --Continue rifaximin 550 mg twice a day --Increase nadolol to 40 mg daily as his resting heart rate is in the 70s at the 20 mg dose. Target resting heart rate 60 bpm --Office follow-up in 2 months --Continue current dose of diuretics --Wife knows to take patient to ER or call 911 if he becomes unarousable or increasing encephalopathy with no response to treatment.  25 minutes spent with the patient today. Greater than 50% was spent in counseling and coordination of care with the patient

## 2015-09-28 NOTE — Telephone Encounter (Signed)
Initial TCM call - pt was driving. Will return call in AM

## 2015-09-28 NOTE — Patient Instructions (Addendum)
We have sent the following medications to your pharmacy for you to pick up at your convenience: Lactose 3 times daily. Xifaxan twice daily. Nadolol 40 mg daily (increase from previous dosage)  Continue lasix and aldactone.  Please follow up with Dr Hilarie Fredrickson on Tuesday, 12/01/15 @ 2:45 pm.

## 2015-09-29 ENCOUNTER — Encounter: Payer: Self-pay | Admitting: Internal Medicine

## 2015-09-29 ENCOUNTER — Ambulatory Visit (INDEPENDENT_AMBULATORY_CARE_PROVIDER_SITE_OTHER): Payer: PPO | Admitting: Internal Medicine

## 2015-09-29 VITALS — BP 116/66 | HR 69 | Temp 98.3°F | Wt 254.0 lb

## 2015-09-29 DIAGNOSIS — I5032 Chronic diastolic (congestive) heart failure: Secondary | ICD-10-CM

## 2015-09-29 DIAGNOSIS — K7581 Nonalcoholic steatohepatitis (NASH): Secondary | ICD-10-CM

## 2015-09-29 DIAGNOSIS — I2510 Atherosclerotic heart disease of native coronary artery without angina pectoris: Secondary | ICD-10-CM | POA: Diagnosis not present

## 2015-09-29 DIAGNOSIS — K7290 Hepatic failure, unspecified without coma: Secondary | ICD-10-CM | POA: Diagnosis not present

## 2015-09-29 DIAGNOSIS — K7460 Unspecified cirrhosis of liver: Secondary | ICD-10-CM

## 2015-09-29 DIAGNOSIS — K219 Gastro-esophageal reflux disease without esophagitis: Secondary | ICD-10-CM

## 2015-09-29 DIAGNOSIS — Z794 Long term (current) use of insulin: Secondary | ICD-10-CM | POA: Diagnosis not present

## 2015-09-29 DIAGNOSIS — E1165 Type 2 diabetes mellitus with hyperglycemia: Secondary | ICD-10-CM

## 2015-09-29 DIAGNOSIS — E785 Hyperlipidemia, unspecified: Secondary | ICD-10-CM

## 2015-09-29 DIAGNOSIS — K7682 Hepatic encephalopathy: Secondary | ICD-10-CM

## 2015-09-29 LAB — MICROALBUMIN / CREATININE URINE RATIO
CREATININE, U: 130.3 mg/dL
MICROALB UR: 0.9 mg/dL (ref 0.0–1.9)
Microalb Creat Ratio: 0.7 mg/g (ref 0.0–30.0)

## 2015-09-29 LAB — LIPID PANEL
CHOLESTEROL: 133 mg/dL (ref 0–200)
HDL: 37.9 mg/dL — ABNORMAL LOW (ref >39.00)
LDL CALC: 74 mg/dL (ref 0–99)
NonHDL: 95.04
TRIGLYCERIDES: 106 mg/dL (ref 0.0–149.0)
Total CHOL/HDL Ratio: 4
VLDL: 21.2 mg/dL (ref 0.0–40.0)

## 2015-09-29 LAB — HEMOGLOBIN A1C: HEMOGLOBIN A1C: 6.7 % — AB (ref 4.6–6.5)

## 2015-09-29 NOTE — Patient Instructions (Signed)
Cirrhosis Cirrhosis is long-term (chronic) liver injury. The liver is your largest internal organ, and it performs many functions. The liver converts food into energy, removes toxic material from your blood, makes important proteins, and absorbs necessary vitamins from your diet. If you have cirrhosis, it means many of your healthy liver cells have been replaced by scar tissue. This prevents blood from flowing through your liver, which makes it difficult for your liver to function. This scarring is not reversible, but treatment can prevent it from getting worse.  CAUSES  Hepatitis C and long-term alcohol abuse are the most common causes of cirrhosis. Other causes include:  Nonalcoholic fatty liver disease.  Hepatitis B infection.  Autoimmune hepatitis.  Diseases that cause blockage of ducts inside the liver.  Inherited liver diseases.  Reactions to certain long-term medicines.  Parasitic infections.  Long-term exposure to certain toxins. RISK FACTORS You may have a higher risk of cirrhosis if you:  Have certain hepatitis viruses.  Abuse alcohol, especially if you are male.  Are overweight.  Share needles.  Have unprotected sex with someone who has hepatitis. SYMPTOMS  You may not have any signs and symptoms at first. Symptoms may not develop until the damage to your liver starts to get worse. Signs and symptoms of cirrhosis may include:   Tenderness in the right-upper part of your abdomen.  Weakness and tiredness (fatigue).  Loss of appetite.  Nausea.  Weight loss and muscle loss.  Itchiness.  Yellow skin and eyes (jaundice).  Buildup of fluid in the abdomen (ascites).  Swelling of the feet and ankles (edema).  Appearance of tiny blood vessels under the skin.  Mental confusion.  Easy bruising and bleeding. DIAGNOSIS  Your health care provider may suspect cirrhosis based on your symptoms and medical history, especially if you have other medical conditions  or a history of alcohol abuse. Your health care provider will do a physical exam to feel your liver and check for signs of cirrhosis. Your health care provider may perform other tests, including:   Blood tests to check:   Whether you have hepatitis B or C.   Kidney function.  Liver function.  Imaging tests such as:  MRI or CT scan to look for changes seen in advanced cirrhosis.  Ultrasound to see if normal liver tissue is being replaced by scar tissue.  A procedure using a long needle to take a sample of liver tissue (biopsy) for examination under a microscope. Liver biopsy can confirm the diagnosis of cirrhosis.  TREATMENT  Treatment depends on how damaged your liver is and what caused the damage. Treatment may include treating cirrhosis symptoms or treating the underlying causes of the condition to try to slow the progression of the damage. Treatment may include:  Making lifestyle changes, such as:   Eating a healthy diet.  Restricting salt intake.  Maintaining a healthy weight.   Not abusing drugs or alcohol.  Taking medicines to:  Treat liver infections or other infections.  Control itching.  Reduce fluid buildup.  Reduce certain blood toxins.  Reduce risk of bleeding from enlarged blood vessels in the stomach or esophagus (varices).  If varices are causing bleeding problems, you may need treatment with a procedure that ties up the vessels causing them to fall off (band ligation).  If cirrhosis is causing your liver to fail, your health care provider may recommend a liver transplant.  Other treatments may be recommended depending on any complications of cirrhosis, such as liver-related kidney failure (hepatorenal  syndrome). HOME CARE INSTRUCTIONS   Take medicines only as directed by your health care provider. Do not use drugs that are toxic to your liver. Ask your health care provider before taking any new medicines, including over-the-counter medicines.    Rest as needed.  Eat a well-balanced diet. Ask your health care provider or dietitian for more information.   You may have to follow a low-salt diet or restrict your water intake as directed.  Do not drink alcohol. This is especially important if you are taking acetaminophen.  Keep all follow-up visits as directed by your health care provider. This is important. SEEK MEDICAL CARE IF:  You have fatigue or weakness that is getting worse.  You develop swelling of the hands, feet, legs, or face.  You have a fever.  You develop loss of appetite.  You have nausea or vomiting.  You develop jaundice.  You develop easy bruising or bleeding. SEEK IMMEDIATE MEDICAL CARE IF:  You vomit bright red blood or a material that looks like coffee grounds.  You have blood in your stools.  Your stools appear black and tarry.  You become confused.  You have chest pain or trouble breathing.   This information is not intended to replace advice given to you by your health care provider. Make sure you discuss any questions you have with your health care provider.   Document Released: 06/27/2005 Document Revised: 07/18/2014 Document Reviewed: 03/05/2014 Elsevier Interactive Patient Education [employer].

## 2015-09-29 NOTE — Assessment & Plan Note (Signed)
Most recent hospitalization reviewed

## 2015-09-29 NOTE — Telephone Encounter (Signed)
Second TCM call - completed  Transition Care Management Follow-up Telephone Call     Date discharged? 09/27/2015        How have you been since you were released from the hospital? Health Status Improving   Any patient concerns? None   Do you understand why you were in the hospital? Yes   Do you understand the discharge instructions? Yes   Where were you discharged to? Home   Items Reviewed:  Medications reviewed: Yes  Allergies reviewed: Yes  Dietary changes reviewed: None  Referrals reviewed: None    Functional Questionnaire:  Independent - I Dependent - D    Activities of Daily Living (ADLs):  Independent with ADLs  Personal hygiene  Dressing Eating  Maintaining continence Transferring  Independent Activities of Daily Living (ADLs): Independent with IADLs Basic communication skills Transportation Meal preparation  Shopping Housework  Managing medications Managing personal finances   Confirmed importance and date/time of follow-up visits scheduled YES  Provider Appointment booked with PCP 09/29/15 @ 1PM   Confirmed with patient if condition begins to worsen call PCP or go to the ER.  Patient was given the office number and encouraged to call back with question or concerns: YES

## 2015-09-29 NOTE — Progress Notes (Signed)
Subjective:    Patient ID: Adam Butler, male    DOB: 05/16/1956, 60 y.o.   MRN: [id number]  HPI  Pt presents to the clinic today for TCM hospital followup. His wife called 911 on 3/18 with c/o that she was unable to wake her husband. He was transported to the ER. Ammonia was 205. He was given a Lactulose enema and admitted for monitoring. He has a history of poorly controlled NASH. By discharge on 3/19, his ammonia was back down to 70. He was advised to continue Rifaximin, and lactulose 3 x day.  He has already had a follow up with Dr. Hilarie Fredrickson- note reviewed. No medications were changed. He was advised to take 1-2 extra doses of Lactulose to ensure that he has at least 3 bowel movements per day.  He is also due for follow up of chronic conditions:  HLD: He denies myalgias on Lipitor. He does try to consume a low fat diet.  CAD s/p MI: He has had stents. He takes ASA daily. He takes Nadalol, Lipitor and Ranexa as prescribed. Cardiology note from 06/2015 reviewed.  CHF: BP well controlled on Lasix. He denies lower extremity edema.  DM 2: His last A1C was 6.8 %. His sugars range 108-295. He takes regular insulin per sliding scale. He takes Metformin as prescribed. He has already had a flu and pneumovax this year. He has never had Prevnar. He does not get yearly eye exams.  Arthritis: He denies any joint issues at this time.  GERD: He takes Prilosec daily. He denies breakthrough symtoms.   Review of Systems  Past Medical History  Diagnosis Date  . Cirrhosis ([place]) 2011    Cryptogenic, Likely NASH. Family/pt deny EtOH. HCV, HBV, HAV negative. ANA negative. AMA positive. Ascites 12/11  . Hyperlipemia   . Coronary artery disease     Inferior MI 12/11; LHC with occluded mid CFX and 80% proximal RCA. EF 55%. He had 3.0 x 28 vision BMS to CFX  . Diastolic CHF, acute ([place])     Echo 12/11 with ef 50-55% and mild LVH. EF 55% by LV0gram in 12/11  . Hypertension   . Type II diabetes  mellitus ([place]) 2011  . SVT (supraventricular tachycardia) ([place])     1/12: appeared to be an ectopic atrial tachycardia. Required DCCV with hemodynamic instability  . GI bleed     12/11: Etiology not clearly defined. EGD: nonbleeding esophageal varices.  . S/P coronary artery stent placement 06/2010  . Esophageal varices ([place]) 2011, 2013    no hx acute variceal bleed  . Hepatic encephalopathy ([place]) 2011, 12/2013  . Myocardial infarction [facility]) ? 2012  . Shortness of breath dyspnea   . Arthritis   . Hx of echocardiogram     Echo 5/16:  EF 55-60%, no RWMA, mod LAE  . Thrombocytopenia ([place])   . Cholelithiasis   . Rectal varices   . PFO (patent foramen ovale): Per TEE 04/20/2015 04/20/2015  . Bacteremia   . Portal hypertension ([place])   . Rectal varices     Current Outpatient Prescriptions  Medication Sig Dispense Refill  . aspirin 81 MG tablet Take 81 mg by mouth daily.     Marland Kitchen atorvastatin (LIPITOR) 10 MG tablet Take 1 tablet (10 mg total) by mouth daily. 90 tablet 1  . furosemide (LASIX) 40 MG tablet Take 1 tablet (40 mg total) by mouth daily. 30 tablet 3  . insulin regular (NOVOLIN R,HUMULIN R) 100 units/mL injection According to  sliding scale: 151-200: 2 units, 201-250 4 units, 251-300 6 units, 301-350 8 units. If > 400 call me. 10 mL 11  . lactulose (CHRONULAC) 10 GM/15ML solution TAKE 45 ML BY MOUTH 3 TIMES A DAY ON SCHEDULE - IF YOU HAVE NOT HAD A BOWEL MOVEMENT BY 1PM ON ANY GIVEN DAY, TAKE AN ADDITIONAL 45ML DOSE TO ASSURE YOU HAVE A BOWEL MOVEMENT AND CONTINUE YOUR SCHEDULED 3X A DAY SCHEDULE ON TOP OF THAT 4800 mL 2  . metFORMIN (GLUCOPHAGE) 1000 MG tablet Take 1,000 mg by mouth 2 (two) times daily.  11  . Multiple Vitamin (MULTIVITAMIN) capsule Take 1 capsule by mouth daily.      . nadolol (CORGARD) 40 MG tablet Take 1 tablet (40 mg total) by mouth daily. 30 tablet 2  . nitroGLYCERIN (NITROSTAT) 0.4 MG SL tablet Place 0.4 mg under the tongue every 5 (five) minutes as needed for  chest pain (chest pain). Reported on 08/21/2015    . omeprazole (PRILOSEC) 20 MG capsule TAKE 1 CAPSULE (20 MG TOTAL) BY MOUTH DAILY. 30 capsule 8  . ranolazine (RANEXA) 500 MG 12 hr tablet Take 1 tablet (500 mg total) by mouth 2 (two) times daily. 60 tablet 2  . rifaximin (XIFAXAN) 550 MG TABS tablet Take 1 tablet (550 mg total) by mouth 2 (two) times daily. 60 tablet 2  . spironolactone (ALDACTONE) 25 MG tablet Take 0.5 tablets (12.5 mg total) by mouth daily. 30 tablet 3   No current facility-administered medications for this visit.    Allergies  Allergen Reactions  . Isosorbide Other (See Comments)    Nose bleeds    Family History  Problem Relation Age of Onset  . Heart attack Brother 82    MI  . Diabetes Brother   . Heart attack Father   . Diabetes Father   . COPD Father   . COPD Mother   . Stroke Neg Hx   . Heart attack Brother     Social History   Social History  . Marital Status: Married    Spouse Name: N/A  . Number of Children: 3  . Years of Education: N/A   Occupational History  . Unemployed Other    Worked in maintenance prior   Social History Main Topics  . Smoking status: Current Every Day Smoker -- 1.00 packs/day for 36 years    Types: E-cigarettes  . Smokeless tobacco: Never Used     Comment: uses vapor cigarettes (2016 )  . Alcohol Use: No  . Drug Use: No  . Sexual Activity: No   Other Topics Concern  . Not on file   Social History Narrative   Married   Gets regular exercise: walking   Constitutional: Pt reports fatigue. Denies fever, malaise, headache or abrupt weight changes.  HEENT: Denies eye pain, eye redness, ear pain, ringing in the ears, wax buildup, runny nose, nasal congestion, bloody nose, or sore throat. Respiratory: Denies difficulty breathing, shortness of breath, cough or sputum production.   Cardiovascular: Denies chest pain, chest tightness, palpitations or swelling in the hands or feet.  Gastrointestinal: Pt reports loose  stools. Denies abdominal pain, bloating, constipation, or blood in the stool.  GU: Denies frequency, urgency, pain with urination, blood in urine, odor or discharge. Musculoskeletal: Denies decrease in range of motion, difficulty with gait, muscle pain or joint pain and swelling.  Skin: Denies redness, rashes, lesions or ulcercations.  Neurological: Denies dizziness, difficulty with memory, difficulty with speech or problems with balance and  coordination.  Psych: Denies anxiety, depression, SI/HI.   Objective:   Physical Exam BP 116/66 mmHg  Pulse 69  Temp(Src) 98.3 F (36.8 C) (Oral)  Wt 254 lb (115.214 kg)  SpO2 98%   General: Appears his stated age, obese, chronically ill appearing in NAD. Skin: Warm, dry and intact. PICC line site CDI. Cardiovascular: Normal rate and rhythm. S1,S2 noted.  No murmur, rubs or gallops noted. 2+ BLE edema noted. Pulmonary/Chest: Normal effort and diminished vesicular breath sounds. No respiratory distress. No wheezes, rales or ronchi noted.  Abdomen: Soft, nontender, active bowel sounds. MSK: No difficulty with gait. Neurological: Alert and oriented. Though process taking slightly longer than normal. Psychiatric: He does engage.       Assessment & Plan:   [facility] hospital follow up for NASH/Hepatic Encephalopathy:  Hospital notes, labs and imaging reviewed Dr. Hilarie Fredrickson note reviewed Continue current treatment at this time  RTC in 6 months for follow up Medicare Wellness

## 2015-09-29 NOTE — Assessment & Plan Note (Signed)
No angina Continue ASA, Lipitor, Nadolol and Ranexa CMET and Lipid Profile today

## 2015-09-29 NOTE — Assessment & Plan Note (Signed)
Continue to follow with GI Take Rifaximin and Lactulose as directed

## 2015-09-29 NOTE — Progress Notes (Signed)
Pre visit review using our clinic review tool, if applicable. No additional management support is needed unless otherwise documented below in the visit note. 

## 2015-09-29 NOTE — Assessment & Plan Note (Signed)
Euvolemic on exam Continue Nadolol and Lasix

## 2015-09-29 NOTE — Assessment & Plan Note (Signed)
Will check A1C and microalbumin today Encouraged him to consume a low carb, low fat diet Foot exam today Encouraged him to get a yearly eye exam Flu and pneumovax UTD Continue Metformin and Regular insulin unless instructed otherwise

## 2015-09-29 NOTE — Assessment & Plan Note (Signed)
Will check Lipid profile and CMET today Continue Lipitor

## 2015-09-29 NOTE — Assessment & Plan Note (Signed)
Continue Prilosec

## 2015-10-02 ENCOUNTER — Other Ambulatory Visit: Payer: Self-pay | Admitting: *Deleted

## 2015-10-02 NOTE — Patient Outreach (Signed)
Pt called to report on his ED visit -   He says he became semi comatose, unsure of the reason this time. It was necessary to go to the ED. He reports they had to give him the lactulose rectally and he doesn't want to go through that again! He has all his meds, is taking them and is really trying to follow his diet to reduce chances of his ammonia level going up.  I will call and check on him again next week. I have a scheduled home visit with him on April 13th.  [name] [facility] [place] [phone]

## 2015-10-09 ENCOUNTER — Other Ambulatory Visit: Payer: Self-pay | Admitting: *Deleted

## 2015-10-09 NOTE — Patient Outreach (Signed)
Telephone call - Pt reports he has had a good week. He has not felt like his ammonia has been high. His glucose this am was 72. He sounds very upbeat and cheerful and is joking with me. I encouraged him to follow his recommended regimen and to call me if any problems arise. I will call him in a week and see him on 10/22/15.  [name] [facility] [place] [phone]

## 2015-10-12 ENCOUNTER — Other Ambulatory Visit: Payer: Self-pay | Admitting: Licensed Clinical Social Worker

## 2015-10-12 NOTE — Patient Outreach (Signed)
[facility]) Care Management  10/12/2015  Adam Butler March 15, 1956 [id number]   Assessment-CSW completed outreach to patient on 10/12/15. Patient answered. Patient reports that he is doing well today. Patient is agreeable to a home visit tomorrow.  Plan-CSW will complete home visit tomorrow on 10/13/15.  Eula Fried, BSW, MSW, [email or web address] Phone: [phone] Fax: [phone]

## 2015-10-13 ENCOUNTER — Other Ambulatory Visit: Payer: Self-pay | Admitting: Licensed Clinical Social Worker

## 2015-10-13 NOTE — Patient Outreach (Signed)
[facility]) Care Management  [facility] Social Work  10/13/2015  MARON STANZIONE 1956/05/14 [id number]  Subjective:    Objective:   Encounter Medications:  Outpatient Encounter Prescriptions as of 10/13/2015  Medication Sig  . aspirin 81 MG tablet Take 81 mg by mouth daily.   Marland Kitchen atorvastatin (LIPITOR) 10 MG tablet Take 1 tablet (10 mg total) by mouth daily.  . furosemide (LASIX) 40 MG tablet Take 1 tablet (40 mg total) by mouth daily.  . insulin regular (NOVOLIN R,HUMULIN R) 100 units/mL injection According to sliding scale: 151-200: 2 units, 201-250 4 units, 251-300 6 units, 301-350 8 units. If > 400 call me.  . lactulose (CHRONULAC) 10 GM/15ML solution TAKE 45 ML BY MOUTH 3 TIMES A DAY ON SCHEDULE - IF YOU HAVE NOT HAD A BOWEL MOVEMENT BY 1PM ON ANY GIVEN DAY, TAKE AN ADDITIONAL 45ML DOSE TO ASSURE YOU HAVE A BOWEL MOVEMENT AND CONTINUE YOUR SCHEDULED 3X A DAY SCHEDULE ON TOP OF THAT  . metFORMIN (GLUCOPHAGE) 1000 MG tablet Take 1,000 mg by mouth 2 (two) times daily.  . Multiple Vitamin (MULTIVITAMIN) capsule Take 1 capsule by mouth daily.    . nadolol (CORGARD) 40 MG tablet Take 1 tablet (40 mg total) by mouth daily.  . nitroGLYCERIN (NITROSTAT) 0.4 MG SL tablet Place 0.4 mg under the tongue every 5 (five) minutes as needed for chest pain (chest pain). Reported on 08/21/2015  . omeprazole (PRILOSEC) 20 MG capsule TAKE 1 CAPSULE (20 MG TOTAL) BY MOUTH DAILY.  . ranolazine (RANEXA) 500 MG 12 hr tablet Take 1 tablet (500 mg total) by mouth 2 (two) times daily.  . rifaximin (XIFAXAN) 550 MG TABS tablet Take 1 tablet (550 mg total) by mouth 2 (two) times daily.  Marland Kitchen spironolactone (ALDACTONE) 25 MG tablet Take 0.5 tablets (12.5 mg total) by mouth daily.   No facility-administered encounter medications on file as of 10/13/2015.    Functional Status:  In your present state of health, do you have any difficulty performing the following activities: 09/27/2015 08/18/2015  Hearing? N N   Vision? N N  Difficulty concentrating or making decisions? N N  Walking or climbing stairs? N N  Dressing or bathing? N N  Doing errands, shopping? N N    Fall/Depression Screening:  PHQ 2/9 Scores 09/01/2015 07/22/2015 04/23/2015  PHQ - 2 Score 0 0 4  PHQ- 9 Score - - 8    Assessment: CSW completed home visit on 10/13/15. Wife was present during session. Patient reports that he is feeling well today. He is in good spirits and cheerful. Patient reports that he forgot to take his advance directive to his PCP appointment but will take it to his next appointment in May of 2017. CSW appraised him for meeting goal. Patient reports that his brother still resides at his home and it is his biggest stressor at this point. Patient states that he went to social services this morning and is hopeful he will relocate in the near future. Patient reports that his daughter, son in law and their two children will be relocating to their own home in June of 2017. Family has mixed feelings about this as they will miss the children that they watch over daily. CSW provided patient with coping tools for this transition. Spouse reports that this may cause as a financial stressor as her daughter was paying for her to watch the children. Patient denies needing financial resources at this time. Patient was asked what he has  done since last home visit that has brought him happiness and improved his self care and he stated "nothing." Patient reports that he has not went camping. He reports that he wishes to go but cannot go alone. Spouse gave him suggestions on who he can take with him but patient declined. CSW reviewed other ways to implement self care into his routine. Patient continues to walk daily both inside his home and outside. Patient reports that his blood sugar was high this morning and he is still checking it three times a day. Patient shares that he talked to his cousin 3 weeks ago but has not been able to talk to him  like he use to as his cousin has been busy. Patient denies any pain. Patient reports that he is taking medications as prescribed. Patient states that his medications at over $100 per month which has been a stressor for him. CSW will update [facility] Nurse Practitioner Victorino Sparrow. Patient receives $1672.00 per month in disability. Patient continues to smoke cigarettes. He states that he might consider nicotine patches if they were free of charge. CSW will review his insurance benefits.   Plan: CSW will send in basket message to [facility] Nurse Practitioner Victorino Sparrow about patient considering nicotine patches and possible medication cost alleviation.   Eula Fried, BSW, MSW, [email or web address] Phone: [phone] Fax: [phone]

## 2015-10-16 ENCOUNTER — Other Ambulatory Visit: Payer: Self-pay | Admitting: *Deleted

## 2015-10-16 NOTE — Patient Outreach (Signed)
Transition of care call. Pt is doing well. He denies any problems this week. His wt is stable and his fasting glucose this am was 118. I have reinforced following his diet to avoid his ammonia level to rise. I will see pt at him home next Thursday.  [name] [facility] [place] [phone]

## 2015-10-22 ENCOUNTER — Ambulatory Visit (INDEPENDENT_AMBULATORY_CARE_PROVIDER_SITE_OTHER): Payer: PPO | Admitting: Internal Medicine

## 2015-10-22 ENCOUNTER — Encounter: Payer: Self-pay | Admitting: *Deleted

## 2015-10-22 ENCOUNTER — Encounter: Payer: Self-pay | Admitting: Internal Medicine

## 2015-10-22 ENCOUNTER — Other Ambulatory Visit: Payer: Self-pay | Admitting: *Deleted

## 2015-10-22 VITALS — BP 100/50 | HR 68 | Resp 16 | Wt 254.6 lb

## 2015-10-22 VITALS — BP 124/74 | HR 70 | Temp 98.4°F | Ht 66.25 in | Wt 262.8 lb

## 2015-10-22 DIAGNOSIS — K7682 Hepatic encephalopathy: Secondary | ICD-10-CM

## 2015-10-22 DIAGNOSIS — K7290 Hepatic failure, unspecified without coma: Secondary | ICD-10-CM

## 2015-10-22 DIAGNOSIS — Z125 Encounter for screening for malignant neoplasm of prostate: Secondary | ICD-10-CM

## 2015-10-22 DIAGNOSIS — Z0000 Encounter for general adult medical examination without abnormal findings: Secondary | ICD-10-CM

## 2015-10-22 LAB — PSA, MEDICARE: PSA: 0.21 ng/ml (ref 0.10–4.00)

## 2015-10-22 NOTE — Progress Notes (Signed)
HPI:  Pt presents to the clinic today for his Medicare Wellness Exam.  Past Medical History  Diagnosis Date  . Cirrhosis ([place]) 2011    Cryptogenic, Likely NASH. Family/pt deny EtOH. HCV, HBV, HAV negative. ANA negative. AMA positive. Ascites 12/11  . Hyperlipemia   . Coronary artery disease     Inferior MI 12/11; LHC with occluded mid CFX and 80% proximal RCA. EF 55%. He had 3.0 x 28 vision BMS to CFX  . Diastolic CHF, acute ([place])     Echo 12/11 with ef 50-55% and mild LVH. EF 55% by LV0gram in 12/11  . Hypertension   . Type II diabetes mellitus ([place]) 2011  . SVT (supraventricular tachycardia) ([place])     1/12: appeared to be an ectopic atrial tachycardia. Required DCCV with hemodynamic instability  . GI bleed     12/11: Etiology not clearly defined. EGD: nonbleeding esophageal varices.  . S/P coronary artery stent placement 06/2010  . Esophageal varices ([place]) 2011, 2013    no hx acute variceal bleed  . Hepatic encephalopathy ([place]) 2011, 12/2013  . Myocardial infarction [facility]) ? 2012  . Shortness of breath dyspnea   . Arthritis   . Hx of echocardiogram     Echo 5/16:  EF 55-60%, no RWMA, mod LAE  . Thrombocytopenia ([place])   . Cholelithiasis   . Rectal varices   . PFO (patent foramen ovale): Per TEE 04/20/2015 04/20/2015  . Bacteremia   . Portal hypertension ([place])   . Rectal varices     Current Outpatient Prescriptions  Medication Sig Dispense Refill  . aspirin 81 MG tablet Take 81 mg by mouth daily.     Marland Kitchen atorvastatin (LIPITOR) 10 MG tablet Take 1 tablet (10 mg total) by mouth daily. 90 tablet 1  . furosemide (LASIX) 40 MG tablet Take 1 tablet (40 mg total) by mouth daily. 30 tablet 3  . insulin regular (NOVOLIN R,HUMULIN R) 100 units/mL injection According to sliding scale: 151-200: 2 units, 201-250 4 units, 251-300 6 units, 301-350 8 units. If > 400 call me. 10 mL 11  . lactulose (CHRONULAC) 10 GM/15ML solution TAKE 45 ML BY MOUTH 3 TIMES A DAY ON SCHEDULE - IF YOU HAVE NOT  HAD A BOWEL MOVEMENT BY 1PM ON ANY GIVEN DAY, TAKE AN ADDITIONAL 45ML DOSE TO ASSURE YOU HAVE A BOWEL MOVEMENT AND CONTINUE YOUR SCHEDULED 3X A DAY SCHEDULE ON TOP OF THAT 4800 mL 2  . metFORMIN (GLUCOPHAGE) 1000 MG tablet Take 1,000 mg by mouth 2 (two) times daily.  11  . Multiple Vitamin (MULTIVITAMIN) capsule Take 1 capsule by mouth daily.      . nadolol (CORGARD) 40 MG tablet Take 1 tablet (40 mg total) by mouth daily. 30 tablet 2  . nitroGLYCERIN (NITROSTAT) 0.4 MG SL tablet Place 0.4 mg under the tongue every 5 (five) minutes as needed for chest pain (chest pain). Reported on 10/22/2015    . omeprazole (PRILOSEC) 20 MG capsule TAKE 1 CAPSULE (20 MG TOTAL) BY MOUTH DAILY. 30 capsule 8  . ranolazine (RANEXA) 500 MG 12 hr tablet Take 1 tablet (500 mg total) by mouth 2 (two) times daily. 60 tablet 2  . rifaximin (XIFAXAN) 550 MG TABS tablet Take 1 tablet (550 mg total) by mouth 2 (two) times daily. 60 tablet 2  . spironolactone (ALDACTONE) 25 MG tablet Take 0.5 tablets (12.5 mg total) by mouth daily. 30 tablet 3   No current facility-administered medications for this visit.  Allergies  Allergen Reactions  . Isosorbide Nitrate Other (See Comments)    Nose bleeds    Family History  Problem Relation Age of Onset  . Heart attack Brother 72    MI  . Diabetes Brother   . Heart attack Father   . Diabetes Father   . COPD Father   . COPD Mother   . Stroke Neg Hx   . Heart attack Brother     Social History   Social History  . Marital Status: Married    Spouse Name: N/A  . Number of Children: 3  . Years of Education: N/A   Occupational History  . Unemployed Other    Worked in maintenance prior   Social History Main Topics  . Smoking status: Current Every Day Smoker -- 1.00 packs/day for 36 years    Types: E-cigarettes  . Smokeless tobacco: Never Used     Comment: uses vapor cigarettes (2016 )  . Alcohol Use: No  . Drug Use: No  . Sexual Activity: No   Other Topics Concern   . Not on file   Social History Narrative   Married   Gets regular exercise: walking    Hospitiliaztions: Multiple  Health Maintenance:    Flu: 04/2015  Tetanus: unsure  Pneumovax: 02/2015  Prevnar: never  PSA: unsure  Colon Screening: 2013  Eye Doctor: as needed  Dental Exam: as needed   Providers:   PCP: [name], NP-C  Cardiologist: Dr. Marigene Ehlers  Gastroenterologist: Dr. Hilarie Fredrickson     I have personally reviewed and have noted:  1. The patient's medical and social history 2. Their use of alcohol, tobacco or illicit drugs 3. Their current medications and supplements 4. The patient's functional ability including ADL's, fall risks, home safety  risks and hearing or visual impairment. 5. Diet and physical activities 6. Evidence for depression or mood disorder   Objective:  PE:   BP 124/74 mmHg  Pulse 70  Temp(Src) 98.4 F (36.9 C) (Oral)  Ht 5' 6.25" (1.683 m)  Wt 262 lb 12 oz (119.183 kg)  BMI 42.08 kg/m2  SpO2 98% Wt Readings from Last 3 Encounters:  10/22/15 262 lb 12 oz (119.183 kg)  10/22/15 254 lb 9.6 oz (115.486 kg)  09/29/15 254 lb (115.214 kg)    General: Appears his stated age, obese in NAD.  Cardiovascular: Normal rate and rhythm. S1,S2 noted.  No murmur, rubs or gallops noted.  Pulmonary/Chest: Normal effort and positive vesicular breath sounds. No respiratory distress. No wheezes, rales or ronchi noted.  Neurological: Alert and oriented.  Psychiatric: He makes inappropriate comments.  BMET    Component Value Date/Time   NA 143 09/27/2015 0517   K 3.6 09/27/2015 0517   CL 109 09/27/2015 0517   CO2 22 09/27/2015 0517   GLUCOSE 112* 09/27/2015 0517   BUN 10 09/27/2015 0517   CREATININE 0.63 09/27/2015 0517   CREATININE 0.74 03/15/2011 0827   CALCIUM 9.0 09/27/2015 0517   GFRNONAA >60 09/27/2015 0517   GFRAA >60 09/27/2015 0517    Lipid Panel     Component Value Date/Time   CHOL 133 09/29/2015 1317   TRIG 106.0 09/29/2015 1317    HDL 37.90* 09/29/2015 1317   CHOLHDL 4 09/29/2015 1317   VLDL 21.2 09/29/2015 1317   LDLCALC 74 09/29/2015 1317    CBC    Component Value Date/Time   WBC 4.5 09/27/2015 0517   RBC 3.66* 09/27/2015 0517   HGB 12.0* 09/27/2015 0347  HCT 36.0* 09/27/2015 0517   PLT 58* 09/27/2015 0517   MCV 98.4 09/27/2015 0517   MCH 32.8 09/27/2015 0517   MCHC 33.3 09/27/2015 0517   RDW 15.3 09/27/2015 0517   LYMPHSABS 1.0 09/26/2015 1146   MONOABS 0.4 09/26/2015 1146   EOSABS 0.1 09/26/2015 1146   BASOSABS 0.0 09/26/2015 1146    Hgb A1C Lab Results  Component Value Date   HGBA1C 6.7* 09/29/2015      Assessment and Plan:   Medicare Annual Wellness Visit:  Diet: He does eat meat. He consumes fruits and veggies a few days per week. He tries to avoid fried food. Physical activity: Sedentary Depression/mood screen: Negative Hearing: Intact to whispered voice Visual acuity: Cataracts, does not perform annual eye exam  ADLs: Capable Fall risk: None Home safety: Good Cognitive evaluation: Intact to orientation, naming, recall and repetition EOL planning: Adv directives, full code/ I agree  Preventative Medicine: He declines tetanus UTD. All other immunizations UTD. PSA today. Colonoscopy UTD. Encouraged him to see a eye doctor and dentist at least annually. Encouraged him to consume a balanced diet and start an exercise regimen.   Next appointment: 6 months, follow up chronic conditions

## 2015-10-22 NOTE — Progress Notes (Signed)
Pre visit review using our clinic review tool, if applicable. No additional management support is needed unless otherwise documented below in the visit note. 

## 2015-10-22 NOTE — Addendum Note (Signed)
Addended by: [name] on: 10/22/2015 03:09 PM   Modules accepted: Orders

## 2015-10-22 NOTE — Patient Instructions (Signed)

## 2015-10-22 NOTE — Patient Outreach (Addendum)
[facility]) Care Management   10/22/2015  UNDREA SHIPES 27-Oct-1955 [id number]  STIRLING ORTON is an 60 y.o. male  Subjective: Pt is doing well. He is following his regimen 95% of the time. No new problems. Son, daughter-in-law and children and hopefully pt's brother will all be moving out soon.  Objective:   Review of Systems  Constitutional: Negative.   HENT: Positive for nosebleeds.        Occasional nose bleed.  Eyes: Negative.   Respiratory: Negative.   Cardiovascular: Negative.   Gastrointestinal:       Frequent stools related to treatment.  Genitourinary: Negative.   Musculoskeletal: Negative.   Skin: Negative.   Neurological: Negative.   Endo/Heme/Allergies: Bruises/bleeds easily.  Psychiatric/Behavioral: Negative.    BP 100/50 mmHg  Pulse 68  Resp 16  Wt 254 lb 9.6 oz (115.486 kg)  SpO2 98%  Physical Exam  Constitutional: He is oriented to person, place, and time. He appears well-developed and well-nourished.  HENT:  Head: Normocephalic.  Cardiovascular: Normal rate, regular rhythm and normal heart sounds.   Respiratory: Effort normal and breath sounds normal.  GI: Soft. Bowel sounds are normal.  Musculoskeletal: Normal range of motion.  Neurological: He is alert and oriented to person, place, and time.  Skin: Skin is warm and dry.  Psychiatric: He has a normal mood and affect.    Encounter Medications:   Outpatient Encounter Prescriptions as of 10/22/2015  Medication Sig Note  . aspirin 81 MG tablet Take 81 mg by mouth daily.    Marland Kitchen atorvastatin (LIPITOR) 10 MG tablet Take 1 tablet (10 mg total) by mouth daily.   . furosemide (LASIX) 40 MG tablet Take 1 tablet (40 mg total) by mouth daily.   . insulin regular (NOVOLIN R,HUMULIN R) 100 units/mL injection According to sliding scale: 151-200: 2 units, 201-250 4 units, 251-300 6 units, 301-350 8 units. If > 400 call me.   . lactulose (CHRONULAC) 10 GM/15ML solution TAKE 45 ML BY MOUTH 3  TIMES A DAY ON SCHEDULE - IF YOU HAVE NOT HAD A BOWEL MOVEMENT BY 1PM ON ANY GIVEN DAY, TAKE AN ADDITIONAL 45ML DOSE TO ASSURE YOU HAVE A BOWEL MOVEMENT AND CONTINUE YOUR SCHEDULED 3X A DAY SCHEDULE ON TOP OF THAT   . metFORMIN (GLUCOPHAGE) 1000 MG tablet Take 1,000 mg by mouth 2 (two) times daily.   . Multiple Vitamin (MULTIVITAMIN) capsule Take 1 capsule by mouth daily.     . nadolol (CORGARD) 40 MG tablet Take 1 tablet (40 mg total) by mouth daily.   Marland Kitchen omeprazole (PRILOSEC) 20 MG capsule TAKE 1 CAPSULE (20 MG TOTAL) BY MOUTH DAILY.   . ranolazine (RANEXA) 500 MG 12 hr tablet Take 1 tablet (500 mg total) by mouth 2 (two) times daily.   . rifaximin (XIFAXAN) 550 MG TABS tablet Take 1 tablet (550 mg total) by mouth 2 (two) times daily.   Marland Kitchen spironolactone (ALDACTONE) 25 MG tablet Take 0.5 tablets (12.5 mg total) by mouth daily.   . nitroGLYCERIN (NITROSTAT) 0.4 MG SL tablet Place 0.4 mg under the tongue every 5 (five) minutes as needed for chest pain (chest pain). Reported on 10/22/2015 10/22/2015: Pt has not needed in several months.   No facility-administered encounter medications on file as of 10/22/2015.    Functional Status:   In your present state of health, do you have any difficulty performing the following activities: 09/27/2015 08/18/2015  Hearing? N N  Vision? N N  Difficulty concentrating  or making decisions? N N  Walking or climbing stairs? N N  Dressing or bathing? N N  Doing errands, shopping? N N    Fall/Depression Screening:    PHQ 2/9 Scores 09/01/2015 07/22/2015 04/23/2015  PHQ - 2 Score 0 0 4  PHQ- 9 Score - - 8    Assessment:  Hepatic encephalopathy                         CHF                         DM  Plan:  Pt has met his goals and I am graduating him from complex case management to have a Health Coach with [facility] who will call pt to keep check on him.  I have reviewed all his chronic illness self management regimen actions and encouraged him to follow through every  day to avoid complications.  THN CM Care Plan Problem One        Most Recent Value   Care Plan Problem One  Pyschosocial barriers   Role Documenting the Problem One  Clinical Social Worker   Care Plan for Problem One  Active   THN Long Term Goal (31-90 days)  Patient will implement self care methods into his routine within 90 days   THN Long Term Goal Start Date  09/01/15   [facility] Long Term Goal Met Date  10/22/15   THN CM Short Term Goal #1 (0-30 days)  Patient will have advance directive signed and notarized within one month   THN CM Short Term Goal #1 Start Date  09/01/15   [facility] CM Short Term Goal #1 Met Date  10/13/15   THN CM Short Term Goal #2 (0-30 days)  Patient will provide copy of advance directive to his PCP within 30 days   THN CM Short Term Goal #2 Start Date  10/13/15   [facility] CM Short Term Goal #2 Met Date  10/22/15     [name] [facility] [place] [phone]

## 2015-10-28 ENCOUNTER — Other Ambulatory Visit: Payer: Self-pay | Admitting: Licensed Clinical Social Worker

## 2015-10-28 NOTE — Patient Outreach (Signed)
[place] [facility]) Care Management  10/28/2015  Adam Butler Jan 04, 1956 [id number]   Assessment-CSW completed outreach to patient but was unable to reach him. HIPPA compliant voice message left.  Plan-CSW will await for return call or will complete an additional outreach by 11/04/15.  Eula Fried, BSW, MSW, [email or web address] Phone: [phone] Fax: [phone]

## 2015-10-30 ENCOUNTER — Other Ambulatory Visit: Payer: Self-pay

## 2015-10-30 NOTE — Patient Outreach (Signed)
[place] [facility]) Care Management  10/30/2015  Adam Butler 07/03/1956 [id number]   Telephone call to patient for introductory call.  Patient reports he is doing good today. Explained to patient health coach role. Patient receptive to health coach outreach. No concerns.    Plan: RN Health Coach will contact patient within 1 month and patient agrees to next outreach.    Jone Baseman, RN, MSN [place] [phone]

## 2015-11-04 ENCOUNTER — Other Ambulatory Visit: Payer: Self-pay | Admitting: Licensed Clinical Social Worker

## 2015-11-04 NOTE — Patient Outreach (Signed)
[facility] [facility]) Care Management  11/04/2015  DEMOND SHALLENBERGER 03-05-1956 [id number]   Assessment-CSW completed outreach to patient. Patient answered. Patient shares that he provided a copy of his advance directive to previous physician appointment. Goal met. Patient reports that he continues to walk daily and count his steps. He shares that he is attending scheduled physician appointments and taking medications as prescribed. All goals met. Patient is agreeable to social work discharge at this time.  Plan-CSW will notify PCP and [place] of social work discharge.  Eula Fried, BSW, MSW, [email or web address] Phone: [phone] Fax: [phone]

## 2015-11-09 ENCOUNTER — Telehealth: Payer: Self-pay

## 2015-11-09 ENCOUNTER — Other Ambulatory Visit: Payer: Self-pay | Admitting: *Deleted

## 2015-11-09 MED ORDER — NITROGLYCERIN 0.4 MG SL SUBL: 0.4000 mg | SUBLINGUAL_TABLET | SUBLINGUAL | Status: DC | PRN

## 2015-11-09 NOTE — Telephone Encounter (Signed)
Pt could not remember who prescribed ntg; pt said what he has is out of date. Advised per hx med list Dr Aundra Dubin was last physician to prescribe. Pt will ck with pharmacy or Dr Claris Gladden office.

## 2015-11-17 ENCOUNTER — Other Ambulatory Visit: Payer: Self-pay

## 2015-11-17 NOTE — Patient Outreach (Signed)
[facility]) Care Management  [place]  11/17/2015   Adam Butler [birth year] [id number]  Subjective: Telephone call to patient for initial assessment.  Patient reports he is doing well. He reports he is taking medications as ordered.  Patient currently checking sugars twice a day.  He reports sugars are ranging from 86- 161. He did share he had one sugar over 300 about two weeks ago.  Discussed with patient continuing to follow diet and taking medications as ordered.  He verbalized understanding.    Objective:   Encounter Medications:  Outpatient Encounter Prescriptions as of 11/17/2015  Medication Sig  . aspirin 81 MG tablet Take 81 mg by mouth daily.   Marland Kitchen atorvastatin (LIPITOR) 10 MG tablet Take 1 tablet (10 mg total) by mouth daily.  . furosemide (LASIX) 40 MG tablet Take 1 tablet (40 mg total) by mouth daily.  . insulin regular (NOVOLIN R,HUMULIN R) 100 units/mL injection According to sliding scale: 151-200: 2 units, 201-250 4 units, 251-300 6 units, 301-350 8 units. If > 400 call me.  . lactulose (CHRONULAC) 10 GM/15ML solution TAKE 45 ML BY MOUTH 3 TIMES A DAY ON SCHEDULE - IF YOU HAVE NOT HAD A BOWEL MOVEMENT BY 1PM ON ANY GIVEN DAY, TAKE AN ADDITIONAL 45ML DOSE TO ASSURE YOU HAVE A BOWEL MOVEMENT AND CONTINUE YOUR SCHEDULED 3X A DAY SCHEDULE ON TOP OF THAT  . metFORMIN (GLUCOPHAGE) 1000 MG tablet Take 1,000 mg by mouth 2 (two) times daily.  . Multiple Vitamin (MULTIVITAMIN) capsule Take 1 capsule by mouth daily.    . nadolol (CORGARD) 40 MG tablet Take 1 tablet (40 mg total) by mouth daily.  . nitroGLYCERIN (NITROSTAT) 0.4 MG SL tablet Place 1 tablet (0.4 mg total) under the tongue every 5 (five) minutes as needed for chest pain (chest pain).  Marland Kitchen omeprazole (PRILOSEC) 20 MG capsule TAKE 1 CAPSULE (20 MG TOTAL) BY MOUTH DAILY.  . ranolazine (RANEXA) 500 MG 12 hr tablet Take 1 tablet (500 mg total) by mouth 2 (two) times daily.  . rifaximin (XIFAXAN) 550 MG  TABS tablet Take 1 tablet (550 mg total) by mouth 2 (two) times daily.  Marland Kitchen spironolactone (ALDACTONE) 25 MG tablet Take 0.5 tablets (12.5 mg total) by mouth daily.   No facility-administered encounter medications on file as of 11/17/2015.    Functional Status:  In your present state of health, do you have any difficulty performing the following activities: 11/17/2015 09/27/2015  Hearing? N N  Vision? N N  Difficulty concentrating or making decisions? - N  Walking or climbing stairs? N N  Dressing or bathing? N N  Doing errands, shopping? N N  Preparing Food and eating ? N -  Using the Toilet? N -  In the past six months, have you accidently leaked urine? N -  Do you have problems with loss of bowel control? N -  Managing your Medications? N -  Managing your Finances? N -  Housekeeping or managing your Housekeeping? N -    Fall/Depression Screening: PHQ 2/9 Scores 10/22/2015 09/01/2015 07/22/2015 04/23/2015  PHQ - 2 Score 0 0 0 4  PHQ- 9 Score - - - 8    Assessment: Patient will benefit from health coach outreach for continued disease management and support.    Plan: [facility] CM Care Plan Problem Two        Most Recent Value   Care Plan Problem Two  Diabetes   Role Documenting the Problem Two  Health  Coach   Care Plan for Problem Two  Active   Interventions for Problem Two Long Term Goal   RN Health Coach discussed with patient importance of diabetic diet and keeping sugars less than 150.     THN Long Term Goal (31-90) days  Pt Hgb A1C will remain under 7 for next 90 days.   THN Long Term Goal Start Date  11/17/15     RN Health Coach will provide ongoing education for patient on diabetes through phone calls and sending printed information to patient for further discussion.  RN Health Coach will send welcome letter.   RN Health Coach will send initial barriers letter, assessment, and care plan to primary care physician.  RN Health Coach will contact patient within one month and patient  agrees to next contact.   Jone Baseman, RN, MSN [place] [phone]

## 2015-12-01 ENCOUNTER — Other Ambulatory Visit (INDEPENDENT_AMBULATORY_CARE_PROVIDER_SITE_OTHER): Payer: PPO

## 2015-12-01 ENCOUNTER — Ambulatory Visit (INDEPENDENT_AMBULATORY_CARE_PROVIDER_SITE_OTHER): Payer: PPO | Admitting: Internal Medicine

## 2015-12-01 ENCOUNTER — Other Ambulatory Visit: Payer: Self-pay

## 2015-12-01 ENCOUNTER — Encounter: Payer: Self-pay | Admitting: Internal Medicine

## 2015-12-01 VITALS — BP 120/76 | HR 96 | Ht 66.75 in | Wt 274.4 lb

## 2015-12-01 DIAGNOSIS — K7290 Hepatic failure, unspecified without coma: Secondary | ICD-10-CM

## 2015-12-01 DIAGNOSIS — K7682 Hepatic encephalopathy: Secondary | ICD-10-CM

## 2015-12-01 DIAGNOSIS — K7581 Nonalcoholic steatohepatitis (NASH): Secondary | ICD-10-CM

## 2015-12-01 DIAGNOSIS — K766 Portal hypertension: Secondary | ICD-10-CM | POA: Diagnosis not present

## 2015-12-01 LAB — CBC WITH DIFFERENTIAL/PLATELET
BASOS PCT: 0.2 % (ref 0.0–3.0)
Basophils Absolute: 0 10*3/uL (ref 0.0–0.1)
EOS ABS: 0.2 10*3/uL (ref 0.0–0.7)
Eosinophils Relative: 3.8 % (ref 0.0–5.0)
HEMATOCRIT: 35.5 % — AB (ref 39.0–52.0)
HEMOGLOBIN: 12 g/dL — AB (ref 13.0–17.0)
LYMPHS PCT: 25.3 % (ref 12.0–46.0)
Lymphs Abs: 1.5 10*3/uL (ref 0.7–4.0)
MCHC: 33.8 g/dL (ref 30.0–36.0)
MCV: 100.4 fl — AB (ref 78.0–100.0)
MONOS PCT: 12 % (ref 3.0–12.0)
Monocytes Absolute: 0.7 10*3/uL (ref 0.1–1.0)
NEUTROS ABS: 3.5 10*3/uL (ref 1.4–7.7)
Neutrophils Relative %: 58.7 % (ref 43.0–77.0)
Platelets: 70 10*3/uL — ABNORMAL LOW (ref 150.0–400.0)
RBC: 3.53 Mil/uL — ABNORMAL LOW (ref 4.22–5.81)
RDW: 16.5 % — AB (ref 11.5–15.5)
WBC: 6 10*3/uL (ref 4.0–10.5)

## 2015-12-01 LAB — PROTIME-INR
INR: 1.3 ratio — AB (ref 0.8–1.0)
PROTHROMBIN TIME: 13.8 s — AB (ref 9.6–13.1)

## 2015-12-01 LAB — COMPREHENSIVE METABOLIC PANEL
ALBUMIN: 3 g/dL — AB (ref 3.5–5.2)
ALT: 42 U/L (ref 0–53)
AST: 60 U/L — AB (ref 0–37)
Alkaline Phosphatase: 182 U/L — ABNORMAL HIGH (ref 39–117)
BILIRUBIN TOTAL: 1.7 mg/dL — AB (ref 0.2–1.2)
BUN: 9 mg/dL (ref 6–23)
CALCIUM: 8.6 mg/dL (ref 8.4–10.5)
CHLORIDE: 107 meq/L (ref 96–112)
CO2: 28 mEq/L (ref 19–32)
CREATININE: 0.63 mg/dL (ref 0.40–1.50)
GFR: 138.2 mL/min (ref >60.00)
Glucose, Bld: 146 mg/dL — ABNORMAL HIGH (ref 70–99)
Potassium: 3.9 mEq/L (ref 3.5–5.1)
Sodium: 140 mEq/L (ref 135–145)
Total Protein: 6.5 g/dL (ref 6.0–8.3)

## 2015-12-01 MED ORDER — SPIRONOLACTONE 25 MG PO TABS: 12.5000 mg | ORAL_TABLET | Freq: Every day | ORAL | Status: DC

## 2015-12-01 NOTE — Progress Notes (Signed)
Subjective:    Patient ID: Adam Butler, male    DOB: 1956-04-26, 60 y.o.   MRN: [id number]  HPI Adam Butler is a 60 year old male with NASH cirrhosis complicated by portal hypertension with hepatic encephalopathy, small esophageal and rectal varices, history of ascites and thrombocytopenia is here for follow-up. He was last seen on 09/28/2015. He's here alone today.  He reports that over the last 2 months he's done quite well. In fact he has been able to remain out of the hospital and has had no further episodes of acute encephalopathy. He has remained on lactulose 3 times a day and rifaximin 550 twice a day. He tolerated increasing nadolol to 40 mg daily. He reports his appetite has returned to normal and he has been overeating. He has gained 14 pounds. He denies lower extremity swelling. He denies abdominal pain. No rectal bleeding or melena. No nausea or vomiting. No trouble swallowing. He's trying to eat a low sodium diet and has avoided eating out at restaurants. He tries to eat baked foods. He is taking Lasix 40 mg daily and Aldactone 25 mg daily. He reports he drove himself here today. Wife at home is stressful he is currently living with his wife, brother and sister-in-law, their 2 children and 2 grandchildren.   Review of Systems As per history of present illness, otherwise negative  Current Medications, Allergies, Past Medical History, Past Surgical History, Family History and Social History were reviewed in [employer] record.      Objective:   Physical Exam BP 120/76 mmHg  Pulse 96  Ht 5' 6.75" (1.695 m)  Wt 274 lb 6.4 oz (124.467 kg)  BMI 43.32 kg/m2  Pulse retaken by me personally and 60 while resting Constitutional: Well-developed and well-nourished. No distress. HEENT: Normocephalic and atraumatic. Oropharynx is clear and moist. No oropharyngeal exudate. Conjunctivae are normal.  No scleral icterus. Neck: Neck supple. Trachea  midline. Cardiovascular: Normal rate, regular rhythm and intact distal pulses.  Pulmonary/chest: Effort normal and breath sounds normal. No wheezing, rales or rhonchi. Abdominal: Soft, Obese with no appreciable ascites, nontender, nondistended. Bowel sounds active throughout.  Extremities: no clubbing, cyanosis, only trace pretibial edema at the ankle Lymphadenopathy: No cervical adenopathy noted. Neurological: Alert and oriented to person place and time. Skin: Skin is warm and dry. No rashes noted. Psychiatric: Normal mood and affect. Behavior is normal.  Lab Results  Component Value Date   INR 1.3* 12/01/2015   INR 1.33 08/18/2015   INR 1.18 06/27/2015   CBC    Component Value Date/Time   WBC 6.0 12/01/2015 1543   RBC 3.53* 12/01/2015 1543   HGB 12.0* 12/01/2015 1543   HCT 35.5* 12/01/2015 1543   PLT 70.0* 12/01/2015 1543   MCV 100.4* 12/01/2015 1543   MCH 32.8 09/27/2015 0517   MCHC 33.8 12/01/2015 1543   RDW 16.5* 12/01/2015 1543   LYMPHSABS 1.5 12/01/2015 1543   MONOABS 0.7 12/01/2015 1543   EOSABS 0.2 12/01/2015 1543   BASOSABS 0.0 12/01/2015 1543    CMP     Component Value Date/Time   NA 140 12/01/2015 1543   K 3.9 12/01/2015 1543   CL 107 12/01/2015 1543   CO2 28 12/01/2015 1543   GLUCOSE 146* 12/01/2015 1543   BUN 9 12/01/2015 1543   CREATININE 0.63 12/01/2015 1543   CREATININE 0.74 03/15/2011 0827   CALCIUM 8.6 12/01/2015 1543   PROT 6.5 12/01/2015 1543   ALBUMIN 3.0* 12/01/2015 1543   AST 60*  12/01/2015 1543   ALT 42 12/01/2015 1543   ALKPHOS 182* 12/01/2015 1543   BILITOT 1.7* 12/01/2015 1543   GFRNONAA >60 09/27/2015 0517   GFRAA >60 09/27/2015 0517        Assessment & Plan:  59 year old male with NASH cirrhosis complicated by portal hypertension with hepatic encephalopathy, small esophageal and rectal varices, history of ascites and thrombocytopenia is here for follow-up.   1. NASH Cirrhosis with portal hypertension -- fortunately no further  hospitalizations or worsening of encephalopathy over the last 2 months. This is encouraging. I recommended that we continue rifaximin 550 mg twice daily. Continue lactulose 45 mL 3 times a day. I've encouraged he continue to follow low-sodium diet and continue current doses of diuretics. I've encouraged him to try to restrict caloric intake so he does not gain further weight which will worsen his fatty liver disease. I do not feel the weight loss is related to fluid accumulation in the abdomen but have recommended repeat abdominal ultrasound for [place] screening. --HCC screening ultrasound ordered, AFP ordered --Low-sodium diet. Continue current doses of diuretics --Rifaximin and lactulose as above --Continue nadolol 40 mg daily given history of small varices, resting heart rate at 60 which is our target --Three-month office follow-up --Liver labs today performed, reviewed and stable  2. Chest pain -- he did mention that he had chest pain last night while resting. He took one nitroglycerin with total resolution of chest pain. He knows to seek care in the ER if his chest pain is not relieved after nitroglycerin.  25 minutes spent with the patient today. Greater than 50% was spent in counseling and coordination of care with the patient

## 2015-12-01 NOTE — Patient Instructions (Signed)
Your physician has requested that you go to the basement for the following lab work before leaving today: CBC, AFP, CMP, INR  Please follow a low sodium diet (maximum 2 grams daily)  Continue Xifaxan and lactuose, nadolol and aldactone.  You have been scheduled for an abdominal ultrasound at [facility] (1st floor of hospital) on 12/04/15 at 8:30 am. Please arrive 15 minutes prior to your appointment for registration. Make certain not to have anything to eat or drink 6 hours prior to your appointment. Should you need to reschedule your appointment, please contact radiology at [phone]. This test typically takes about 30 minutes to perform.  Please follow up with Dr Hilarie Fredrickson in 3 months.  If you are age 57 or older, your body mass index should be between 23-30. Your Body mass index is 43.32 kg/(m^2). If this is out of the aforementioned range listed, please consider follow up with your Primary Care Provider.  If you are age 74 or younger, your body mass index should be between 19-25. Your Body mass index is 43.32 kg/(m^2). If this is out of the aformentioned range listed, please consider follow up with your Primary Care Provider.

## 2015-12-02 LAB — AFP TUMOR MARKER: AFP TUMOR MARKER: 4.2 ng/mL (ref <6.1)

## 2015-12-04 ENCOUNTER — Ambulatory Visit (HOSPITAL_COMMUNITY)
Admission: RE | Admit: 2015-12-04 | Discharge: 2015-12-04 | Disposition: A | Discharge status: Home / Self Care | Payer: PPO | Source: Ambulatory Visit | Attending: Internal Medicine | Admitting: Internal Medicine

## 2015-12-04 DIAGNOSIS — R161 Splenomegaly, not elsewhere classified: Secondary | ICD-10-CM | POA: Diagnosis not present

## 2015-12-04 DIAGNOSIS — K7581 Nonalcoholic steatohepatitis (NASH): Secondary | ICD-10-CM | POA: Insufficient documentation

## 2015-12-04 DIAGNOSIS — R932 Abnormal findings on diagnostic imaging of liver and biliary tract: Secondary | ICD-10-CM | POA: Insufficient documentation

## 2015-12-04 DIAGNOSIS — R188 Other ascites: Secondary | ICD-10-CM | POA: Insufficient documentation

## 2015-12-04 DIAGNOSIS — K7460 Unspecified cirrhosis of liver: Secondary | ICD-10-CM | POA: Diagnosis not present

## 2015-12-04 DIAGNOSIS — K8020 Calculus of gallbladder without cholecystitis without obstruction: Secondary | ICD-10-CM | POA: Insufficient documentation

## 2015-12-15 ENCOUNTER — Other Ambulatory Visit: Payer: Self-pay

## 2015-12-15 NOTE — Patient Outreach (Signed)
[name]) Care Management  [place]  12/15/2015   Adam Butler [birth year] [id number]  Subjective: Telephone call to patient for monthly call.  Patient reports he is doing good. He report that he saw his gastroenterologist about 2 weeks ago. He states everything went well.  He states that his ammonia levels are down.  No changes with medications.  He states his blood sugar this morning was 160.  He states he ate pizza last night, which he normally does not eat.  Discussed with patient diet and controlling blood sugar.  He verbalized understanding.   Objective:   Encounter Medications:  Outpatient Encounter Prescriptions as of 12/15/2015  Medication Sig  . aspirin 81 MG tablet Take 81 mg by mouth daily.   Marland Kitchen atorvastatin (LIPITOR) 10 MG tablet Take 1 tablet (10 mg total) by mouth daily.  . furosemide (LASIX) 40 MG tablet Take 1 tablet (40 mg total) by mouth daily.  . insulin regular (NOVOLIN R,HUMULIN R) 100 units/mL injection According to sliding scale: 151-200: 2 units, 201-250 4 units, 251-300 6 units, 301-350 8 units. If > 400 call me.  . lactulose (CHRONULAC) 10 GM/15ML solution TAKE 45 ML BY MOUTH 3 TIMES A DAY ON SCHEDULE - IF YOU HAVE NOT HAD A BOWEL MOVEMENT BY 1PM ON ANY GIVEN DAY, TAKE AN ADDITIONAL 45ML DOSE TO ASSURE YOU HAVE A BOWEL MOVEMENT AND CONTINUE YOUR SCHEDULED 3X A DAY SCHEDULE ON TOP OF THAT  . metFORMIN (GLUCOPHAGE) 1000 MG tablet Take 1,000 mg by mouth 2 (two) times daily.  . Multiple Vitamin (MULTIVITAMIN) capsule Take 1 capsule by mouth daily.    . nadolol (CORGARD) 40 MG tablet Take 1 tablet (40 mg total) by mouth daily.  . nitroGLYCERIN (NITROSTAT) 0.4 MG SL tablet Place 1 tablet (0.4 mg total) under the tongue every 5 (five) minutes as needed for chest pain (chest pain).  Marland Kitchen omeprazole (PRILOSEC) 20 MG capsule TAKE 1 CAPSULE (20 MG TOTAL) BY MOUTH DAILY.  . ranolazine (RANEXA) 500 MG 12 hr tablet Take 1 tablet (500 mg total) by mouth  2 (two) times daily.  . rifaximin (XIFAXAN) 550 MG TABS tablet Take 1 tablet (550 mg total) by mouth 2 (two) times daily.  Marland Kitchen spironolactone (ALDACTONE) 25 MG tablet Take 0.5 tablets (12.5 mg total) by mouth daily.   No facility-administered encounter medications on file as of 12/15/2015.    Functional Status:  In your present state of health, do you have any difficulty performing the following activities: 11/17/2015 09/27/2015  Hearing? N N  Vision? N N  Difficulty concentrating or making decisions? - N  Walking or climbing stairs? N N  Dressing or bathing? N N  Doing errands, shopping? N N  Preparing Food and eating ? N -  Using the Toilet? N -  In the past six months, have you accidently leaked urine? N -  Do you have problems with loss of bowel control? N -  Managing your Medications? N -  Managing your Finances? N -  Housekeeping or managing your Housekeeping? N -    Fall/Depression Screening: PHQ 2/9 Scores 12/15/2015 10/22/2015 09/01/2015 07/22/2015 04/23/2015  PHQ - 2 Score 0 0 0 0 4  PHQ- 9 Score - - - - 8    Assessment: Patient continues to benefit from health coach outreach for disease management and support.    Plan:  [facility] CM Care Plan Problem Two        Most Recent Value  Care Plan Problem Two  Diabetes   Role Documenting the Problem Two  [place] for Problem Two  Active   Interventions for Problem Two Long Term Goal   RN Health Coach reviewed with patient importance of diabetic diet and keeping sugars less than 150.     THN Long Term Goal (31-90) days  Pt Hgb A1C will remain under 7 for next 90 days.   THN Long Term Goal Start Date  11/17/15     RN Health Coach will contact patient within one month and patient agrees to next outreach.   Jone Baseman, RN, MSN [place] [phone]

## 2015-12-16 ENCOUNTER — Inpatient Hospital Stay (HOSPITAL_COMMUNITY)
Admission: EM | Admit: 2015-12-16 | Discharge: 2015-12-18 | DRG: 442 | Disposition: A | Discharge status: Home / Self Care | Payer: PPO | Attending: Internal Medicine | Admitting: Internal Medicine

## 2015-12-16 ENCOUNTER — Encounter (HOSPITAL_COMMUNITY): Payer: Self-pay | Admitting: Neurology

## 2015-12-16 DIAGNOSIS — Z7982 Long term (current) use of aspirin: Secondary | ICD-10-CM | POA: Diagnosis not present

## 2015-12-16 DIAGNOSIS — I252 Old myocardial infarction: Secondary | ICD-10-CM

## 2015-12-16 DIAGNOSIS — IMO0001 Reserved for inherently not codable concepts without codable children: Secondary | ICD-10-CM | POA: Insufficient documentation

## 2015-12-16 DIAGNOSIS — K7682 Hepatic encephalopathy: Secondary | ICD-10-CM | POA: Diagnosis present

## 2015-12-16 DIAGNOSIS — I110 Hypertensive heart disease with heart failure: Secondary | ICD-10-CM | POA: Diagnosis present

## 2015-12-16 DIAGNOSIS — K7290 Hepatic failure, unspecified without coma: Secondary | ICD-10-CM | POA: Diagnosis not present

## 2015-12-16 DIAGNOSIS — I5032 Chronic diastolic (congestive) heart failure: Secondary | ICD-10-CM | POA: Diagnosis present

## 2015-12-16 DIAGNOSIS — E785 Hyperlipidemia, unspecified: Secondary | ICD-10-CM | POA: Diagnosis present

## 2015-12-16 DIAGNOSIS — R410 Disorientation, unspecified: Secondary | ICD-10-CM | POA: Diagnosis not present

## 2015-12-16 DIAGNOSIS — K7460 Unspecified cirrhosis of liver: Secondary | ICD-10-CM | POA: Diagnosis present

## 2015-12-16 DIAGNOSIS — I2510 Atherosclerotic heart disease of native coronary artery without angina pectoris: Secondary | ICD-10-CM | POA: Diagnosis present

## 2015-12-16 DIAGNOSIS — K219 Gastro-esophageal reflux disease without esophagitis: Secondary | ICD-10-CM | POA: Diagnosis not present

## 2015-12-16 DIAGNOSIS — R402411 Glasgow coma scale score 13-15, in the field [EMT or ambulance]: Secondary | ICD-10-CM | POA: Diagnosis not present

## 2015-12-16 DIAGNOSIS — E119 Type 2 diabetes mellitus without complications: Secondary | ICD-10-CM | POA: Diagnosis not present

## 2015-12-16 DIAGNOSIS — D696 Thrombocytopenia, unspecified: Secondary | ICD-10-CM | POA: Diagnosis not present

## 2015-12-16 DIAGNOSIS — I8510 Secondary esophageal varices without bleeding: Secondary | ICD-10-CM | POA: Diagnosis not present

## 2015-12-16 DIAGNOSIS — K766 Portal hypertension: Secondary | ICD-10-CM | POA: Diagnosis present

## 2015-12-16 DIAGNOSIS — I509 Heart failure, unspecified: Secondary | ICD-10-CM

## 2015-12-16 DIAGNOSIS — Q211 Atrial septal defect: Secondary | ICD-10-CM

## 2015-12-16 DIAGNOSIS — K7581 Nonalcoholic steatohepatitis (NASH): Secondary | ICD-10-CM | POA: Diagnosis not present

## 2015-12-16 DIAGNOSIS — Z794 Long term (current) use of insulin: Secondary | ICD-10-CM | POA: Diagnosis not present

## 2015-12-16 DIAGNOSIS — I8500 Esophageal varices without bleeding: Secondary | ICD-10-CM | POA: Diagnosis not present

## 2015-12-16 DIAGNOSIS — Z79899 Other long term (current) drug therapy: Secondary | ICD-10-CM | POA: Diagnosis not present

## 2015-12-16 DIAGNOSIS — Z720 Tobacco use: Secondary | ICD-10-CM | POA: Diagnosis present

## 2015-12-16 DIAGNOSIS — E118 Type 2 diabetes mellitus with unspecified complications: Secondary | ICD-10-CM | POA: Diagnosis not present

## 2015-12-16 DIAGNOSIS — Z955 Presence of coronary angioplasty implant and graft: Secondary | ICD-10-CM | POA: Diagnosis not present

## 2015-12-16 DIAGNOSIS — F17200 Nicotine dependence, unspecified, uncomplicated: Secondary | ICD-10-CM | POA: Diagnosis present

## 2015-12-16 DIAGNOSIS — Q2112 Patent foramen ovale: Secondary | ICD-10-CM

## 2015-12-16 LAB — COMPREHENSIVE METABOLIC PANEL
ALT: 40 U/L (ref 17–63)
AST: 64 U/L — ABNORMAL HIGH (ref 15–41)
Albumin: 2.5 g/dL — ABNORMAL LOW (ref 3.5–5.0)
Alkaline Phosphatase: 161 U/L — ABNORMAL HIGH (ref 38–126)
Anion gap: 6 (ref 5–15)
BUN: 6 mg/dL (ref 6–20)
CALCIUM: 8.7 mg/dL — AB (ref 8.9–10.3)
CHLORIDE: 109 mmol/L (ref 101–111)
CO2: 22 mmol/L (ref 22–32)
CREATININE: 0.69 mg/dL (ref 0.61–1.24)
GFR calc non Af Amer: >60 mL/min (ref >60)
Glucose, Bld: 181 mg/dL — ABNORMAL HIGH (ref 65–99)
Potassium: 4.5 mmol/L (ref 3.5–5.1)
SODIUM: 137 mmol/L (ref 135–145)
TOTAL PROTEIN: 6.4 g/dL — AB (ref 6.5–8.1)
Total Bilirubin: 1.7 mg/dL — ABNORMAL HIGH (ref 0.3–1.2)

## 2015-12-16 LAB — CBC WITH DIFFERENTIAL/PLATELET
BASOS ABS: 0 10*3/uL (ref 0.0–0.1)
BASOS PCT: 0 %
EOS ABS: 0.2 10*3/uL (ref 0.0–0.7)
EOS PCT: 4 %
HCT: 36.4 % — ABNORMAL LOW (ref 39.0–52.0)
Hemoglobin: 12.3 g/dL — ABNORMAL LOW (ref 13.0–17.0)
Lymphocytes Relative: 18 %
Lymphs Abs: 0.8 10*3/uL (ref 0.7–4.0)
MCH: 33.4 pg (ref 26.0–34.0)
MCHC: 33.8 g/dL (ref 30.0–36.0)
MCV: 98.9 fL (ref 78.0–100.0)
MONO ABS: 0.4 10*3/uL (ref 0.1–1.0)
Monocytes Relative: 9 %
Neutro Abs: 3 10*3/uL (ref 1.7–7.7)
Neutrophils Relative %: 69 %
PLATELETS: 52 10*3/uL — AB (ref 150–400)
RBC: 3.68 MIL/uL — AB (ref 4.22–5.81)
RDW: 15.3 % (ref 11.5–15.5)
WBC: 4.3 10*3/uL (ref 4.0–10.5)

## 2015-12-16 LAB — RAPID URINE DRUG SCREEN, HOSP PERFORMED
Amphetamines: NOT DETECTED
BARBITURATES: NOT DETECTED
BENZODIAZEPINES: NOT DETECTED
COCAINE: NOT DETECTED
Opiates: NOT DETECTED
Tetrahydrocannabinol: NOT DETECTED

## 2015-12-16 LAB — GLUCOSE, CAPILLARY: Glucose-Capillary: 119 mg/dL — ABNORMAL HIGH (ref 65–99)

## 2015-12-16 LAB — AMMONIA
AMMONIA: 204 umol/L — AB (ref 9–35)
Ammonia: 68 umol/L — ABNORMAL HIGH (ref 9–35)

## 2015-12-16 LAB — URINALYSIS, ROUTINE W REFLEX MICROSCOPIC
Bilirubin Urine: NEGATIVE
GLUCOSE, UA: NEGATIVE mg/dL
Hgb urine dipstick: NEGATIVE
Ketones, ur: NEGATIVE mg/dL
LEUKOCYTES UA: NEGATIVE
Nitrite: NEGATIVE
PROTEIN: NEGATIVE mg/dL
SPECIFIC GRAVITY, URINE: 1.018 (ref 1.005–1.030)
pH: 7.5 (ref 5.0–8.0)

## 2015-12-16 LAB — ETHANOL

## 2015-12-16 LAB — MRSA PCR SCREENING: MRSA by PCR: NEGATIVE

## 2015-12-16 LAB — CBG MONITORING, ED: Glucose-Capillary: 126 mg/dL — ABNORMAL HIGH (ref 65–99)

## 2015-12-16 LAB — I-STAT CG4 LACTIC ACID, ED: LACTIC ACID, VENOUS: 1.76 mmol/L (ref 0.5–2.0)

## 2015-12-16 MED ORDER — LACTULOSE ENEMA
300.0000 mL | Freq: Three times a day (TID) | ORAL | Status: DC
  Administered 2015-12-16 – 2015-12-17 (×3): 300 mL via RECTAL
  Filled 2015-12-16 (×6): qty 300

## 2015-12-16 MED ORDER — ONDANSETRON HCL 4 MG PO TABS: 4.0000 mg | ORAL_TABLET | Freq: Four times a day (QID) | ORAL | Status: DC | PRN

## 2015-12-16 MED ORDER — INSULIN ASPART 100 UNIT/ML ~~LOC~~ SOLN
0.0000 [IU] | Freq: Three times a day (TID) | SUBCUTANEOUS | Status: DC
  Administered 2015-12-16: 1 [IU] via SUBCUTANEOUS
  Administered 2015-12-17: 2 [IU] via SUBCUTANEOUS
  Administered 2015-12-18: 1 [IU] via SUBCUTANEOUS
  Filled 2015-12-16: qty 1

## 2015-12-16 MED ORDER — HYDRALAZINE HCL 20 MG/ML IJ SOLN
5.0000 mg | Freq: Three times a day (TID) | INTRAMUSCULAR | Status: DC | PRN
Start: 2015-12-16 — End: 2015-12-18

## 2015-12-16 MED ORDER — SODIUM CHLORIDE 0.9% FLUSH
3.0000 mL | Freq: Two times a day (BID) | INTRAVENOUS | Status: DC
  Administered 2015-12-17 (×2): 3 mL via INTRAVENOUS

## 2015-12-16 MED ORDER — SODIUM CHLORIDE 0.9 % IV SOLN
INTRAVENOUS | Status: DC
  Administered 2015-12-16 – 2015-12-17 (×2): via INTRAVENOUS

## 2015-12-16 MED ORDER — SPIRONOLACTONE 25 MG PO TABS
12.5000 mg | ORAL_TABLET | Freq: Every day | ORAL | Status: DC
  Administered 2015-12-17 – 2015-12-18 (×2): 12.5 mg via ORAL
  Filled 2015-12-16 (×2): qty 1

## 2015-12-16 MED ORDER — RANOLAZINE ER 500 MG PO TB12
500.0000 mg | ORAL_TABLET | Freq: Two times a day (BID) | ORAL | Status: DC
  Administered 2015-12-17 – 2015-12-18 (×3): 500 mg via ORAL
  Filled 2015-12-16 (×3): qty 1

## 2015-12-16 MED ORDER — TRAZODONE HCL 50 MG PO TABS: 25.0000 mg | ORAL_TABLET | Freq: Every evening | ORAL | Status: DC | PRN

## 2015-12-16 MED ORDER — NADOLOL 40 MG PO TABS
40.0000 mg | ORAL_TABLET | Freq: Every day | ORAL | Status: DC
  Administered 2015-12-17: 40 mg via ORAL
  Filled 2015-12-16 (×2): qty 1

## 2015-12-16 MED ORDER — RIFAXIMIN 550 MG PO TABS
550.0000 mg | ORAL_TABLET | Freq: Two times a day (BID) | ORAL | Status: DC
  Administered 2015-12-17 – 2015-12-18 (×3): 550 mg via ORAL
  Filled 2015-12-16 (×4): qty 1

## 2015-12-16 MED ORDER — ATORVASTATIN CALCIUM 10 MG PO TABS
10.0000 mg | ORAL_TABLET | Freq: Every day | ORAL | Status: DC
  Administered 2015-12-17 – 2015-12-18 (×2): 10 mg via ORAL
  Filled 2015-12-16 (×2): qty 1

## 2015-12-16 MED ORDER — ONDANSETRON HCL 4 MG/2ML IJ SOLN: 4.0000 mg | Freq: Four times a day (QID) | INTRAMUSCULAR | Status: DC | PRN

## 2015-12-16 NOTE — ED Notes (Signed)
2nd dose of 500 cc enema inserted into rectal tube. Clamp opened prior for drainage and small amount was present. Plan to unclamp at 1545, to allow outflow. NAD. Pt tolerated procedure. VSS.

## 2015-12-16 NOTE — H&P (Signed)
[facility] History and Physical  Adam Butler KTG:[id number] DOB: Jun 11, 1956 DOA: 12/16/2015  Referring physician: Wyvonnia Dusky PCP: [name], NP   Chief Complaint: Acute encephalopathy  Admit from Home  Chief Complaint: Confusion  HPI: Adam Butler is a 60 y.o. male past medical history of liver cirrhosis due to NASH, CAD status post stent placement, GI bleed, esophageal and rectal varices, hepatic encephalopathy, thrombocytopenia, PFO, hypertension, diabetes presents to the emergency Department chief complaint of altered mental status. Initial evaluation is consistent with hepatic encephalopathy.  Information is obtained from the chart as no one is at bedside. He had been recently admitted from 3/18-3/19/17 with acute encephalopathy, receiving lactulose with resolution of AMS.  He is on chronic lactulose and per chart, was compliant with meds without any recent dose changes. Last visit to GI as OP was on 5/23, stable clinically.  During this admission, his ammonia levels are 204. He is also compliant with his blood sugar meds, 181 on admission. No recent URI type symptoms reported He is afebrile hemodynamically stable but quite obtunded  responding to only painful stimuli. He is not hypoxic.   ED course:  BP 120/64 mmHg  Pulse 58  Temp(Src) 97.9 F (36.6 C) (Rectal)  Resp 18  SpO2 96% Ammonia 204., Glu 181, AST 64, Alk P 161, PLts 52, Hb 12.3, ETOH    . UA negative UDS pending Lactic Acid 1.76 EKG with SR and no ACS QTC 484  Review of Systems:  Cannot obtain due to acute encephalopathy  Past Medical History  Diagnosis Date  . Cirrhosis ([place]) 2011    Cryptogenic, Likely NASH. Family/pt deny EtOH. HCV, HBV, HAV negative. ANA negative. AMA positive. Ascites 12/11  . Hyperlipemia   . Coronary artery disease     Inferior MI 12/11; LHC with occluded mid CFX and 80% proximal RCA. EF 55%. He had 3.0 x 28 vision BMS to CFX  . Diastolic CHF, acute ([place])     Echo  12/11 with ef 50-55% and mild LVH. EF 55% by LV0gram in 12/11  . Hypertension   . Type II diabetes mellitus ([place]) 2011  . SVT (supraventricular tachycardia) ([place])     1/12: appeared to be an ectopic atrial tachycardia. Required DCCV with hemodynamic instability  . GI bleed     12/11: Etiology not clearly defined. EGD: nonbleeding esophageal varices.  . S/P coronary artery stent placement 06/2010  . Esophageal varices ([place]) 2011, 2013    no hx acute variceal bleed  . Hepatic encephalopathy ([place]) 2011, 12/2013  . Myocardial infarction [facility]) ? 2012  . Shortness of breath dyspnea   . Arthritis   . Hx of echocardiogram     Echo 5/16:  EF 55-60%, no RWMA, mod LAE  . Thrombocytopenia ([place])   . Cholelithiasis   . Rectal varices   . PFO (patent foramen ovale): Per TEE 04/20/2015 04/20/2015  . Bacteremia   . Portal hypertension ([place])   . Rectal varices    Past Surgical History  Procedure Laterality Date  . Cardiac catheterization  07/01/2010    BMS to CFX.  Marland Kitchen Appendectomy    . Orif r leg    . Esophagogastroduodenoscopy  04/13/2012    Procedure: ESOPHAGOGASTRODUODENOSCOPY (EGD);  Surgeon: Inda Castle, MD;  Location: [facility];  Service: Endoscopy;  Laterality: N/A;  . Colonoscopy  04/13/2012    Procedure: COLONOSCOPY;  Surgeon: Inda Castle, MD;  Location: [facility] ENDOSCOPY;  Service: Endoscopy;  Laterality: N/A;  . Esophagogastroduodenoscopy N/A  01/01/2014    Procedure: ESOPHAGOGASTRODUODENOSCOPY (EGD);  Surgeon: Jerene Bears, MD;  Location: [facility] ENDOSCOPY;  Service: Endoscopy;  Laterality: N/A;  . Esophageal banding N/A 01/01/2014    Procedure: ESOPHAGEAL BANDING;  Surgeon: Jerene Bears, MD;  Location: [facility] ENDOSCOPY;  Service: Endoscopy;  Laterality: N/A;  . Coronary stent placement  06/30/2010    CFX   Distal        . Left heart catheterization with coronary angiogram N/A 10/15/2014    Procedure: LEFT HEART CATHETERIZATION WITH CORONARY ANGIOGRAM;  Surgeon: Peter M Martinique, MD;  Location:  [facility] CATH LAB;  Service: Cardiovascular;  Laterality: N/A;  . Tee without cardioversion N/A 04/20/2015    Procedure: TRANSESOPHAGEAL ECHOCARDIOGRAM (TEE);  Surgeon: Fay Records, MD;  Location: [facility] ENDOSCOPY;  Service: Cardiovascular;  Laterality: N/A;   Social History:  reports that he has been smoking E-cigarettes.  He has a 36 pack-year smoking history. He has never used smokeless tobacco. He reports that he does not drink alcohol or use illicit drugs.  Allergies  Allergen Reactions  . Isosorbide Nitrate Other (See Comments)    Nose bleeds    Family History  Problem Relation Age of Onset  . Heart attack Brother 35    MI  . Diabetes Brother   . Heart attack Father   . Diabetes Father   . COPD Father   . COPD Mother   . Stroke Neg Hx   . Heart attack Brother      Prior to Admission medications   Medication Sig Start Date End Date Taking? Authorizing Provider  aspirin 81 MG tablet Take 81 mg by mouth daily.     Historical Provider, MD  atorvastatin (LIPITOR) 10 MG tablet Take 1 tablet (10 mg total) by mouth daily. Patient not taking: Reported on 09/17/2015 09/16/15   [name], NP  furosemide (LASIX) 40 MG tablet Take 1 tablet (40 mg total) by mouth daily. 08/07/15   Jerene Bears, MD  insulin regular (NOVOLIN R,HUMULIN R) 100 units/mL injection According to sliding scale: 151-200: 2 units, 201-250 4 units, 251-300 6 units, 301-350 8 units. If > 400 call me. 07/09/15   [name], NP  lactulose (CHRONULAC) 10 GM/15ML solution TAKE 45 ML BY MOUTH 3 TIMES A DAY. MAY TAKE AN ADDITIONAL DOSE AS NEEDED FOR BOWEL MOVEMENT 09/22/15   Jerene Bears, MD  metFORMIN (GLUCOPHAGE) 1000 MG tablet Take 1,000 mg by mouth 2 (two) times daily. 11/27/14   Historical Provider, MD  Multiple Vitamin (MULTIVITAMIN) capsule Take 1 capsule by mouth daily.      Historical Provider, MD  nadolol (CORGARD) 40 MG tablet Take 0.5 tablets (20 mg total) by mouth daily. 06/30/15   Allie Bossier, MD  nitroGLYCERIN  (NITROSTAT) 0.4 MG SL tablet Place 0.4 mg under the tongue every 5 (five) minutes as needed for chest pain (chest pain). Reported on 08/21/2015    Historical Provider, MD  omeprazole (PRILOSEC) 20 MG capsule TAKE 1 CAPSULE (20 MG TOTAL) BY MOUTH DAILY. 09/03/15   Larey Dresser, MD  ranolazine (RANEXA) 500 MG 12 hr tablet Take 1 tablet (500 mg total) by mouth 2 (two) times daily. 07/09/15   [name], NP  rifaximin (XIFAXAN) 550 MG TABS tablet Take 1 tablet (550 mg total) by mouth 2 (two) times daily. 08/07/15   Jerene Bears, MD  spironolactone (ALDACTONE) 25 MG tablet Take 0.5 tablets (12.5 mg total) by mouth daily. 08/07/15   Lajuan Lines  Pyrtle, MD   Physical Exam: Filed Vitals:   12/16/15 1045 12/16/15 1100 12/16/15 1102 12/16/15 1130  BP: 116/66 146/74  120/64  Pulse: 58 67  58  Temp:   97.9 F (36.6 C)   TempSrc:   Rectal   Resp: 18 20  18   SpO2: 97% 97%  96%    Wt Readings from Last 3 Encounters:  12/01/15 124.467 kg (274 lb 6.4 oz)  10/22/15 119.183 kg (262 lb 12 oz)  10/22/15 115.486 kg (254 lb 9.6 oz)    General:  Non interactive at this time due to AMS Eyes: PERRL, normal lids, irises & conjunctiva ENT: grossly normal hearing,  Neck: no LAD, masses or thyromegaly Cardiovascular: RRR, no m/r/g. No LE edema. Respiratory: CTA bilaterally but distant, no w/r/r. Normal respiratory effort. Abdomen: soft, ntnd, obese no obvious ascites Skin: no rash or induration seen on limited exam, jaundice Musculoskeletal: grossly normal tone BUE/BLE Neurologic: unable to follow commands, non responsive at this time to painful stimuli. .           Labs on Admission:  Basic Metabolic Panel:  Recent Labs Lab 12/16/15 1029  NA 137  K 4.5  CL 109  CO2 22  GLUCOSE 181*  BUN 6  CREATININE 0.69  CALCIUM 8.7*   Liver Function Tests:  Recent Labs Lab 12/16/15 1029  AST 64*  ALT 40  ALKPHOS 161*  BILITOT 1.7*  PROT 6.4*  ALBUMIN 2.5*   No results for input(s): LIPASE, AMYLASE  in the last 168 hours.  Recent Labs Lab 12/16/15 1029  AMMONIA 204*   CBC:  Recent Labs Lab 12/16/15 1029  WBC 4.3  NEUTROABS 3.0  HGB 12.3*  HCT 36.4*  MCV 98.9  PLT 52*   Cardiac Enzymes: No results for input(s): CKTOTAL, CKMB, CKMBINDEX, TROPONINI in the last 168 hours.  BNP (last 3 results)  Recent Labs  06/05/15 2325 06/27/15 0520  BNP 37.5 35.3    ProBNP (last 3 results) No results for input(s): PROBNP in the last 8760 hours.  CBG: No results for input(s): GLUCAP in the last 168 hours.  Radiological Exams on Admission: No results found.  EKG: Independently reviewed. Normal sinus rhythm Inferior infarct , age undetermined Prolonged QT Abnormal ECG   Assessment/Plan Principal Problem:   Encephalopathy, hepatic (HCC) Active Problems:   HLD (hyperlipidemia)   DIASTOLIC HEART FAILURE, CHRONIC   Esophageal varices (HCC)   PFO (patent foramen ovale): Per TEE 04/20/2015   Tobacco abuse   Liver cirrhosis secondary to NASH   Type 2 diabetes mellitus (HCC)   GERD (gastroesophageal reflux disease)  #1. Hepatic encephalopathy in a patient with a history of NASH, recent hospitalization in 09/2015 for same  Ammonia level 204  Home medications include lactulose 45 mils 3 times a day, rifaxamin. Currently quite obtunded but not hypoxic and protecting his airway. Lactic acid 1.76,  total bili 1.7, UA negative, ETOH pending.  UDS pending -Admit to SDU -Lactulose enema 200 mils every 8 hours. Hold Xifaxan till tomorrow -Repeat ammonia level in a.m. -IV fluids  -Frequent neuro checks -Resume home medications when alert -We'll obtain abdominal ultrasound rule out ascites  #2. Liver cirrhosis secondary to [facility] see above. AST 64, ALT 42, alkaline phosphatase 161 Obtain PT/INR -Recheck compliance of metabolic panel in the a.m. -Continue PPI once alert  #3. Thrombocytopenia, chronic. Platelets 52 on admission. Close to his most recent baseline no signs symptoms of  bleeding No transfusion unless Plt less than 10k or  acutely bleeding.  -Monitor with recheck labs in a.m   4. Diabetes type 2. Serum glucose 181 on admission, A1C 6.7 on 09/2015 SSI Heart healthy carb modified diet once AMS improves   #5. Chronic diastolic heart failure. Appears compensated. Echo in October 2016 with an EF 55% mild LVH.  medications include Lasix, Aldactone -Obtain daily weights -Monitor intake and output -Hold home medications until more alert   Hypertension BP 130/75 mmHg  Pulse 59  Temp(Src) 97.9 F (36.6 C) (Rectal)  Resp 22  SpO2 97% Controlled  Continue home anti-hypertensive medications including  Add Hydralazine Q6 hours as needed for BP 160/90   Hyperlipidemia Continue home Lipitor tomorrow    6. CAD. Appears stable at baseline. EKG without acute changes. -Resume home meds when more alert continue aspirin Ranexa and beta blocker   Code Status: full DVT Prophylaxis: SCDS Family Communication: none Disposition Plan: home when ready  Time spent: [facility]

## 2015-12-16 NOTE — ED Notes (Signed)
Pt woken to name, is able to state name and location but not situation or time.  Only repeats birth date.  Denies any pain or distress at this time.

## 2015-12-16 NOTE — ED Notes (Signed)
Pt responsive to name, was able to help turn himself over but not oriented to place, time or situation.  Only repeats his birthday over and over. 400cc of drainage from tube.

## 2015-12-16 NOTE — ED Provider Notes (Signed)
CSN: [id number]     Arrival date & time 12/16/15  1009 History   First MD Initiated Contact with Patient 12/16/15 1021     Chief Complaint  Patient presents with  . Altered Mental Status     (Consider location/radiation/quality/duration/timing/severity/associated sxs/prior Treatment) HPI   Adam Butler is a 60 y.o. male who presents from home for altered mental status. Note is with him and he cannot give history. He has cirrhosis and takes lactulose and rifaximin chronically. He saw his GI physician 2 weeks ago, was doing well and able to drive at that time.  Level V caveat- altered mental status   Past Medical History  Diagnosis Date  . Cirrhosis ([place]) 2011    Cryptogenic, Likely NASH. Family/pt deny EtOH. HCV, HBV, HAV negative. ANA negative. AMA positive. Ascites 12/11  . Hyperlipemia   . Coronary artery disease     Inferior MI 12/11; LHC with occluded mid CFX and 80% proximal RCA. EF 55%. He had 3.0 x 28 vision BMS to CFX  . Diastolic CHF, acute ([place])     Echo 12/11 with ef 50-55% and mild LVH. EF 55% by LV0gram in 12/11  . Hypertension   . Type II diabetes mellitus ([place]) 2011  . SVT (supraventricular tachycardia) ([place])     1/12: appeared to be an ectopic atrial tachycardia. Required DCCV with hemodynamic instability  . GI bleed     12/11: Etiology not clearly defined. EGD: nonbleeding esophageal varices.  . S/P coronary artery stent placement 06/2010  . Esophageal varices ([place]) 2011, 2013    no hx acute variceal bleed  . Hepatic encephalopathy ([place]) 2011, 12/2013  . Myocardial infarction [facility]) ? 2012  . Shortness of breath dyspnea   . Arthritis   . Hx of echocardiogram     Echo 5/16:  EF 55-60%, no RWMA, mod LAE  . Thrombocytopenia ([place])   . Cholelithiasis   . Rectal varices   . PFO (patent foramen ovale): Per TEE 04/20/2015 04/20/2015  . Bacteremia   . Portal hypertension ([place])   . Rectal varices    Past Surgical History  Procedure Laterality Date  .  Cardiac catheterization  07/01/2010    BMS to CFX.  Marland Kitchen Appendectomy    . Orif r leg    . Esophagogastroduodenoscopy  04/13/2012    Procedure: ESOPHAGOGASTRODUODENOSCOPY (EGD);  Surgeon: Inda Castle, MD;  Location: [facility] END[facility];  Service: Endoscopy;  Laterality: N/A;  . Colonoscopy  04/13/2012    Procedure: COLONOSCOPY;  Surgeon: Inda Castle, MD;  Location: [facility] ENDOSCOPY;  Service: Endoscopy;  Laterality: N/A;  . Esophagogastroduodenoscopy N/A 01/01/2014    Procedure: ESOPHAGOGASTRODUODENOSCOPY (EGD);  Surgeon: Jerene Bears, MD;  Location: [facility] ENDOSCOPY;  Service: Endoscopy;  Laterality: N/A;  . Esophageal banding N/A 01/01/2014    Procedure: ESOPHAGEAL BANDING;  Surgeon: Jerene Bears, MD;  Location: [facility] ENDOSCOPY;  Service: Endoscopy;  Laterality: N/A;  . Coronary stent placement  06/30/2010    CFX   Distal        . Left heart catheterization with coronary angiogram N/A 10/15/2014    Procedure: LEFT HEART CATHETERIZATION WITH CORONARY ANGIOGRAM;  Surgeon: Peter M Martinique, MD;  Location: [facility] CATH LAB;  Service: Cardiovascular;  Laterality: N/A;  . Tee without cardioversion N/A 04/20/2015    Procedure: TRANSESOPHAGEAL ECHOCARDIOGRAM (TEE);  Surgeon: Fay Records, MD;  Location: [facility] ENDOSCOPY;  Service: Cardiovascular;  Laterality: N/A;   Family History  Problem Relation Age of Onset  . Heart  attack Brother 78    MI  . Diabetes Brother   . Heart attack Father   . Diabetes Father   . COPD Father   . COPD Mother   . Stroke Neg Hx   . Heart attack Brother    Social History  Substance Use Topics  . Smoking status: Current Every Day Smoker -- 1.00 packs/day for 36 years    Types: E-cigarettes  . Smokeless tobacco: Never Used     Comment: uses vapor cigarettes (2016 )  . Alcohol Use: No    Review of Systems  All other systems reviewed and are negative.     Allergies  Isosorbide nitrate  Home Medications   Prior to Admission medications   Medication Sig Start Date End Date  Taking? Authorizing Provider  aspirin 81 MG tablet Take 81 mg by mouth daily.    Yes Historical Provider, MD  atorvastatin (LIPITOR) 10 MG tablet Take 1 tablet (10 mg total) by mouth daily. 09/16/15  Yes [name], NP  furosemide (LASIX) 40 MG tablet Take 1 tablet (40 mg total) by mouth daily. 08/07/15  Yes Jerene Bears, MD  insulin regular (NOVOLIN R,HUMULIN R) 100 units/mL injection According to sliding scale: 151-200: 2 units, 201-250 4 units, 251-300 6 units, 301-350 8 units. If > 400 call me. 07/09/15  Yes [name], NP  lactulose (CHRONULAC) 10 GM/15ML solution TAKE 45 ML BY MOUTH 3 TIMES A DAY ON SCHEDULE - IF YOU HAVE NOT HAD A BOWEL MOVEMENT BY 1PM ON ANY GIVEN DAY, TAKE AN ADDITIONAL 45ML DOSE TO ASSURE YOU HAVE A BOWEL MOVEMENT AND CONTINUE YOUR SCHEDULED 3X A DAY SCHEDULE ON TOP OF THAT 09/28/15  Yes Jerene Bears, MD  metFORMIN (GLUCOPHAGE) 1000 MG tablet Take 1,000 mg by mouth 2 (two) times daily. 11/27/14  Yes Historical Provider, MD  Multiple Vitamin (MULTIVITAMIN) capsule Take 1 capsule by mouth daily.     Yes Historical Provider, MD  nadolol (CORGARD) 40 MG tablet Take 1 tablet (40 mg total) by mouth daily. 09/28/15  Yes Jerene Bears, MD  omeprazole (PRILOSEC) 20 MG capsule TAKE 1 CAPSULE (20 MG TOTAL) BY MOUTH DAILY. 09/03/15  Yes Larey Dresser, MD  ranolazine (RANEXA) 500 MG 12 hr tablet Take 1 tablet (500 mg total) by mouth 2 (two) times daily. 07/09/15  Yes [name], NP  rifaximin (XIFAXAN) 550 MG TABS tablet Take 1 tablet (550 mg total) by mouth 2 (two) times daily. 09/28/15  Yes Jerene Bears, MD  spironolactone (ALDACTONE) 25 MG tablet Take 0.5 tablets (12.5 mg total) by mouth daily. 12/01/15  Yes Jerene Bears, MD  nitroGLYCERIN (NITROSTAT) 0.4 MG SL tablet Place 1 tablet (0.4 mg total) under the tongue every 5 (five) minutes as needed for chest pain (chest pain). 11/09/15   Larey Dresser, MD   BP 122/67 mmHg  Pulse 58  Temp(Src) 97.9 F (36.6 C) (Rectal)  Resp 18   SpO2 96% Physical Exam  Constitutional: He appears well-developed. He appears distressed (Obtunded).  Appears older than stated age. Overweight.  HENT:  Head: Normocephalic and atraumatic.  Right Ear: External ear normal.  Left Ear: External ear normal.  Eyes: Conjunctivae and EOM are normal. Pupils are equal, round, and reactive to light.  Neck: Normal range of motion and phonation normal. Neck supple.  Cardiovascular: Normal rate, regular rhythm and normal heart sounds.   Pulmonary/Chest: Effort normal and breath sounds normal. He exhibits no bony tenderness.  Airway is intact. No respiratory distress.  Abdominal: Soft. He exhibits no distension. There is no tenderness.  Musculoskeletal:  Small abrasion left dorsal forearm, which has a dressing on it. No deformities of the large joints of the extremities.  Neurological: He is alert. No cranial nerve deficit or sensory deficit. He exhibits normal muscle tone. Coordination normal.  Open eyes to voice, does not follow commands.  Skin: Skin is warm, dry and intact.  Psychiatric:  Obtunded  Nursing note and vitals reviewed.   ED Course  Procedures (including critical care time)  Medications  lactulose (CHRONULAC) enema 200 gm (300 mLs Rectal Given 12/16/15 1423)  spironolactone (ALDACTONE) tablet 12.5 mg (not administered)  nadolol (CORGARD) tablet 40 mg (not administered)  rifaximin (XIFAXAN) tablet 550 mg (not administered)  atorvastatin (LIPITOR) tablet 10 mg (not administered)  ranolazine (RANEXA) 12 hr tablet 500 mg (not administered)  0.9 %  sodium chloride infusion (not administered)  traZODone (DESYREL) tablet 25 mg (not administered)  ondansetron (ZOFRAN) tablet 4 mg (not administered)    Or  ondansetron (ZOFRAN) injection 4 mg (not administered)  sodium chloride flush (NS) 0.9 % injection 3 mL (not administered)  insulin aspart (novoLOG) injection 0-9 Units (not administered)  hydrALAZINE (APRESOLINE) injection 5-10 mg  (not administered)    Patient Vitals for the past 24 hrs:  BP Temp Temp src Pulse Resp SpO2  12/16/15 1330 122/67 mmHg - - (!) 58 18 96 %  12/16/15 1245 130/75 mmHg - - (!) 59 22 97 %  12/16/15 1130 120/64 mmHg - - (!) 58 18 96 %  12/16/15 1102 - 97.9 F (36.6 C) Rectal - - -  12/16/15 1100 146/74 mmHg - - 67 20 97 %  12/16/15 1045 116/66 mmHg - - (!) 58 18 97 %  12/16/15 1019 122/67 mmHg - Oral (!) 59 22 96 %    11:49 AM Reevaluation with update and discussion. After initial assessment and treatment, an updated evaluation reveals No change in clinical status. [place] complete with Hospitalist. Patient case explained and discussed. She agrees to admit patient for further evaluation and treatment.   Labs Review Labs Reviewed  COMPREHENSIVE METABOLIC PANEL - Abnormal; Notable for the following:    Glucose, Bld 181 (*)    Calcium 8.7 (*)    Total Protein 6.4 (*)    Albumin 2.5 (*)    AST 64 (*)    Alkaline Phosphatase 161 (*)    Total Bilirubin 1.7 (*)    All other components within normal limits  CBC WITH DIFFERENTIAL/PLATELET - Abnormal; Notable for the following:    RBC 3.68 (*)    Hemoglobin 12.3 (*)    HCT 36.4 (*)    Platelets 52 (*)    All other components within normal limits  AMMONIA - Abnormal; Notable for the following:    Ammonia 204 (*)    All other components within normal limits  ETHANOL  URINE RAPID DRUG SCREEN, HOSP PERFORMED  URINALYSIS, ROUTINE W REFLEX MICROSCOPIC (NOT AT [facility])  I-STAT CG4 LACTIC ACID, ED    Imaging Review No results found. I have personally reviewed and evaluated these images and lab results as part of my medical decision-making.   EKG Interpretation   Date/Time:  Wednesday December 16 2015 10:15:25 EDT Ventricular Rate:  61 PR Interval:  148 QRS Duration: 98 QT Interval:  481 QTC Calculation: 484 R Axis:   -3 Text Interpretation:  Sinus rhythm Inferolateral infarct, age  indeterminate  Baseline wander in  lead(s) I aVR since last tracing no  significant change Confirmed by [facility]  MD, Aryan Bello [phone]) on 12/16/2015  10:22:11 AM      MDM   Final diagnoses:  Hepatic encephalopathy ([place])    Hepatic encephalopathy, recurrent. Seen in March 2017, admitted to the hospital and stayed less than 48 hours with rapid improvement following rectal enemas, with lactulose. This appears to be very similar process today. Doubt spontaneous bacterial peritonitis.  Nursing Notes Reviewed/ Care Coordinated, and agree without changes. Applicable Imaging Reviewed.  Interpretation of Laboratory Data incorporated into ED treatment  Plan: Admit    Daleen Bo, MD 12/16/15 1513

## 2015-12-16 NOTE — ED Notes (Addendum)
Waiting for supplies to be sent from materials for enema. Pt remains obtunded.

## 2015-12-16 NOTE — ED Notes (Signed)
Have half of enema (500cc), with flexi seal placed. It is clamped for 30-60 mins. Then will finish enema.

## 2015-12-16 NOTE — ED Notes (Signed)
RN unclamped drainage tube, fluid began to drain.  Will continue to monitor

## 2015-12-16 NOTE — ED Notes (Signed)
Per ems- pt comes from home with hx of cirrhosis from NASH. Altered mental status since this morning per family. Has hx of CHF, pt very lethargic, responds to name, falls back to sleep after that. BP 137/60, CBG 196.

## 2015-12-16 NOTE — ED Notes (Signed)
Received lactulose from pharmacy, ordered flexiseal and retention enema set from materials.

## 2015-12-16 NOTE — ED Notes (Signed)
MD at bedside. 

## 2015-12-16 NOTE — ED Notes (Signed)
Notified provider of heart rate

## 2015-12-16 NOTE — ED Notes (Signed)
Attempted to call report, bed not ready, floor has called for bed to be cleaned and called pt placement (to notify ED).

## 2015-12-17 ENCOUNTER — Other Ambulatory Visit: Payer: Self-pay

## 2015-12-17 ENCOUNTER — Inpatient Hospital Stay (HOSPITAL_COMMUNITY): Payer: PPO

## 2015-12-17 DIAGNOSIS — K7290 Hepatic failure, unspecified without coma: Secondary | ICD-10-CM | POA: Diagnosis not present

## 2015-12-17 DIAGNOSIS — R0602 Shortness of breath: Secondary | ICD-10-CM | POA: Diagnosis not present

## 2015-12-17 LAB — COMPREHENSIVE METABOLIC PANEL
ALT: 37 U/L (ref 17–63)
ANION GAP: 5 (ref 5–15)
AST: 59 U/L — ABNORMAL HIGH (ref 15–41)
Albumin: 2.4 g/dL — ABNORMAL LOW (ref 3.5–5.0)
Alkaline Phosphatase: 95 U/L (ref 38–126)
BUN: 9 mg/dL (ref 6–20)
CHLORIDE: 112 mmol/L — AB (ref 101–111)
CO2: 23 mmol/L (ref 22–32)
CREATININE: 0.67 mg/dL (ref 0.61–1.24)
Calcium: 8.6 mg/dL — ABNORMAL LOW (ref 8.9–10.3)
Glucose, Bld: 113 mg/dL — ABNORMAL HIGH (ref 65–99)
POTASSIUM: 3.6 mmol/L (ref 3.5–5.1)
SODIUM: 140 mmol/L (ref 135–145)
Total Bilirubin: 2.5 mg/dL — ABNORMAL HIGH (ref 0.3–1.2)
Total Protein: 5.7 g/dL — ABNORMAL LOW (ref 6.5–8.1)

## 2015-12-17 LAB — CBC
HEMATOCRIT: 35.1 % — AB (ref 39.0–52.0)
HEMOGLOBIN: 11.5 g/dL — AB (ref 13.0–17.0)
MCH: 33.1 pg (ref 26.0–34.0)
MCHC: 32.8 g/dL (ref 30.0–36.0)
MCV: 101.2 fL — AB (ref 78.0–100.0)
PLATELETS: 54 10*3/uL — AB (ref 150–400)
RBC: 3.47 MIL/uL — AB (ref 4.22–5.81)
RDW: 15.8 % — ABNORMAL HIGH (ref 11.5–15.5)
WBC: 4.5 10*3/uL (ref 4.0–10.5)

## 2015-12-17 LAB — GLUCOSE, CAPILLARY
GLUCOSE-CAPILLARY: 113 mg/dL — AB (ref 65–99)
GLUCOSE-CAPILLARY: 173 mg/dL — AB (ref 65–99)
Glucose-Capillary: 144 mg/dL — ABNORMAL HIGH (ref 65–99)
Glucose-Capillary: 169 mg/dL — ABNORMAL HIGH (ref 65–99)

## 2015-12-17 LAB — PROTIME-INR
INR: 1.43 (ref 0.00–1.49)
Prothrombin Time: 17.5 seconds — ABNORMAL HIGH (ref 11.6–15.2)

## 2015-12-17 MED ORDER — LACTULOSE 10 GM/15ML PO SOLN
30.0000 g | Freq: Three times a day (TID) | ORAL | Status: DC
  Administered 2015-12-17 – 2015-12-18 (×3): 30 g via ORAL
  Filled 2015-12-17 (×3): qty 45

## 2015-12-17 NOTE — Patient Outreach (Signed)
[facility]) Care Management  12/17/2015  Adam Butler 1955/09/07 [id number]   Patient hospitalized on 12-16-15.  Message sent to hospital liaison for follow up.   Plan: RN Coach will send letter to primary doctor notify of discipline closure and community nurse to follow after hospitalization.    Jone Baseman, RN, MSN [place] [phone]

## 2015-12-17 NOTE — Consult Note (Signed)
   [facility] CM Inpatient Consult   12/17/2015  Adam Butler 12-May-1956 [id number]  Referral to acknowledge that this patient has admitted.  This is the patient's 4th admission in 6 month.  Patient has recently active with [facility] for chronic disease management services.  Patient has been engaged by a [facility] and has had involvement of the RN [facility].  Active consent on file.  Will follow for post hospital needs for community care management. Of note, [facility] Care Management services does not replace or interfere with any services that are needed or arranged by inpatient case management or social work.  For additional questions or referrals please contact:  Natividad Brood, RN BSN [facility] Liaison  [phone] business mobile phone Toll free office [phone]

## 2015-12-17 NOTE — Progress Notes (Signed)
Pt refusing bed alarm at this time. Alert X4. Agitated, states he will leave if alarm is set. Alarm off. Wife at bedside. Pt states they will call when needing assistance.

## 2015-12-17 NOTE — Progress Notes (Signed)
Pt transferred to [address] bed 23 per recliner with all belongings by NT. Wife aware of transfer. Pt bathed prior to transfer D/c condom cath and Flexiseal Pt has cell phone and glasses with him in his possession

## 2015-12-17 NOTE — Progress Notes (Signed)
Called report to [facility] on [address] for bed 23

## 2015-12-17 NOTE — Evaluation (Signed)
Physical Therapy Evaluation Patient Details Name: Adam Butler MRN: [id number] DOB: [birth year] Today's Date: 12/17/2015   History of Present Illness  Pt is a 60 y/o M who presented w/ acute encephalopathy likely related to recurrent metabolic derangements from hepatic cirrhosis and hyperammonemia.  Pt's PMH includes cirrhosis, CAD, CHF, supraventricular tachycardia, MI.    Clinical Impression  Pt admitted with above diagnosis. Pt currently with functional limitations due to the deficits listed below (see PT Problem List). Adam Butler presents w/ poor safety awareness and requires min guard assist for all aspects of mobility due to impulsivity.  Pt demonstrated jerky movements Bil UEs w/ supine>sit transfer but continued conversation w/ therapist throughout and neuro check WNL. Recommend that pt begin using RW at home due to his fall history and instability. Pt will benefit from skilled PT to increase their independence and safety with mobility to allow discharge to the venue listed below.      Follow Up Recommendations Home health PT;Supervision for mobility/OOB    Equipment Recommendations  Rolling walker with 5" wheels    Recommendations for Other Services OT consult     Precautions / Restrictions Precautions Precautions: Fall Precaution Comments: rectal pouch Restrictions Weight Bearing Restrictions: No      Mobility  Bed Mobility Overal bed mobility: Needs Assistance Bed Mobility: Supine to Sit     Supine to sit: Min guard     General bed mobility comments: Pt demonstrates jerking movement in Bil UEs w/ supine>sit transition.  Pt contiues to talk to therapist during transition and neuro check following this is WNL.  RN notified.   Transfers Overall transfer level: Needs assistance Equipment used: Rolling walker (2 wheeled) Transfers: Sit to/from Stand Sit to Stand: Min guard         General transfer comment: Cues for hand placement and technique using RW.  Pt  impulsive and attempts standing before lines arranged.  Min guard for safety.  Ambulation/Gait Ambulation/Gait assistance: Min guard Ambulation Distance (Feet): 125 Feet Assistive device: Rolling walker (2 wheeled) Gait Pattern/deviations: Step-through pattern;Decreased stride length;Drifts right/left   Gait velocity interpretation: Below normal speed for age/gender General Gait Details: Pt drifts Lt w/ head turns while ambulating.  Min guard for safety.  HR fluctuates between 68-105 while ambulating.    Stairs            Wheelchair Mobility    Modified Rankin (Stroke Patients Only)       Balance Overall balance assessment: Needs assistance Sitting-balance support: Bilateral upper extremity supported;Feet supported Sitting balance-Leahy Scale: Poor Sitting balance - Comments: UE support while sitting EOB   Standing balance support: Bilateral upper extremity supported;During functional activity Standing balance-Leahy Scale: Poor Standing balance comment: Relies on UE support                             Pertinent Vitals/Pain Pain Assessment: No/denies pain    Home Living Family/patient expects to be discharged to:: Private residence Living Arrangements: Spouse/significant other Available Help at Discharge: Family;Available PRN/intermittently (wife works 1 day/wk) Type of Home: House Home Access: Stairs to enter Entrance Stairs-Rails: None Technical brewer of Steps: 3 Home Layout: One level Home Equipment: Cane - single point      Prior Function Level of Independence: Independent with assistive device(s)         Comments: Uses cane when he is walking longer distances.  Has had ~2 falls in the past 6 months.  Hand Dominance   Dominant Hand: Right    Extremity/Trunk Assessment   Upper Extremity Assessment: Overall WFL for tasks assessed           Lower Extremity Assessment: Overall WFL for tasks assessed          Communication   Communication: No difficulties  Cognition Arousal/Alertness: Awake/alert Behavior During Therapy: Impulsive Overall Cognitive Status: No family/caregiver present to determine baseline cognitive functioning                      General Comments General comments (skin integrity, edema, etc.): BP 112/51 supine at start of session, 101/46 sitting EOB after supine>sit.  BP 82/42 supine at end of session but cuff readjusted and BP reading 114/70.      Exercises        Assessment/Plan    PT Assessment Patient needs continued PT services  PT Diagnosis Difficulty walking   PT Problem List Decreased balance;Decreased cognition;Decreased knowledge of use of DME;Decreased safety awareness;Obesity  PT Treatment Interventions DME instruction;Gait training;Stair training;Functional mobility training;Therapeutic activities;Therapeutic exercise;Balance training;Cognitive remediation;Patient/family education   PT Goals (Current goals can be found in the Care Plan section) Acute Rehab PT Goals Patient Stated Goal: to go home today PT Goal Formulation: With patient Time For Goal Achievement: 12/31/15 Potential to Achieve Goals: Good    Frequency Min 3X/week   Barriers to discharge Inaccessible home environment Steps to enter home    Co-evaluation               End of Session Equipment Utilized During Treatment: Gait belt Activity Tolerance: Patient tolerated treatment well Patient left: in bed;with call bell/phone within reach;with bed alarm set;Other (comment);with nursing/sitter in room (w/ transport in room to take pt to x-ray; RN at bedside) Nurse Communication: Mobility status;Other (comment) (BP, jerking movements)         Time: 3009-2330 PT Time Calculation (min) (ACUTE ONLY): 35 min   Charges:   PT Evaluation $PT Eval Low Complexity: 1 Procedure PT Treatments $Gait Training: 8-22 mins   PT G Codes:       Collie Siad PT, DPT  Pager:  [phone] Phone: [phone] 12/17/2015, 10:01 AM

## 2015-12-17 NOTE — Progress Notes (Signed)
PROGRESS NOTE    Adam Butler  DQQ:[id number] DOB: Apr 13, 1956 DOA: 12/16/2015 PCP: [name], NP (Confirm with patient/family/NH records and if not entered, this HAS to be entered at [facility] point of entry. "No PCP" if truly none.)   Brief Narrative: (Start on day 1 of progress note - keep it brief and live Portosystemic hepatic encephalopathy, 60 y/o male with cirrhosis, chf, htn, and T2 DM. Responding well to medical therapy, transferred out of the step down unit.    Assessment & Plan:   Principal Problem:   Encephalopathy, hepatic (HCC) Active Problems:   HLD (hyperlipidemia)   DIASTOLIC HEART FAILURE, CHRONIC   Esophageal varices (HCC)   PFO (patent foramen ovale): Per TEE 04/20/2015   Tobacco abuse   Liver cirrhosis secondary to NASH   Type 2 diabetes mellitus (HCC)   GERD (gastroesophageal reflux disease)   Hepatic encephalopathy ([place])   1. Cardiovascular. Will continue b blockade with nadolol and diuretic therapy with aldactone. No clinical signs of systemic infection.  2, Pulmonary, No signs of aspiration, will continue to monitor oxymetry, and supplemental 02 per [place] to target 02 sat above 92%  3. Nephrology. Renal function with cr at 0.67 with N at 140 and K at 3,6, will follow on renal panel in am, will continue to hold on IV fluids for now, will advance diet to regular.  4. Gastroenterology. Decompensated liver failure, will continue lactulose therapy, clinically patient more awake and alert, will change rout to po, will advance diet as tolerated. Continue diuretic therapy and rifaximin.  5, Neurology. Metabolic encephalopathy, will continue neuro checks and lactulose therapy. Advance diet as tolerated and physical therapy evaluation. Continue trazodone.   6. Endocrinology. Will continue glucose cover with sliding scale,  Serum glucose 181 to 131.  Patient continue at moderate risk for worsening encephalopathy.   DVT prophylaxis:  (Lovenox/Heparin/SCD's/anticoagulated/None (if comfort care) Code Status: (Full/Partial - specify details) Family Communication: (Specify name, relationship & date discussed. NO "discussed with patient") Disposition Plan: (specify when and where you expect patient to be discharged). Include barriers to DC in this tab.   Consultants:     Procedures: (Don't include imaging studies which can be auto populated. Include things that cannot be auto populated i.e. Echo, Carotid and venous dopplers, Foley, Bipap, HD, tubes/drains, wound vac, central lines etc)    Antimicrobials: (specify start and planned stop date. Auto populated tables are space occupying and do not give end dates)     Subjective:  Patient feeling better, more awake and alert, no chest pain, nausea or vomiting. No confusion or agitation.   Objective: Filed Vitals:   12/17/15 0751 12/17/15 0800 12/17/15 0900 12/17/15 1129  BP: 103/49 130/42 112/51 143/62  Pulse: 63 88 62 55  Temp: 98.2 F (36.8 C)   98.4 F (36.9 C)  TempSrc: Oral   Oral  Resp: 19 28 18 17   Height:      Weight:      SpO2: 96% 96% 96% 96%    Intake/Output Summary (Last 24 hours) at 12/17/15 1218 Last data filed at 12/17/15 1136  Gross per 24 hour  Intake 916.75 ml  Output    750 ml  Net 166.75 ml   Filed Weights   12/16/15 2041  Weight: 124.5 kg (274 lb 7.6 oz)    Examination:  General exam: Deconditioned E ENT: mild conjunctival pallor, no icterus. Oral mucosa moist. Respiratory system: Clear to auscultation. Respiratory effort normal. Mild decreased breath sounds at bases. Cardiovascular system:  S1 & S2 heard, RRR. No JVD, murmurs, rubs, gallops or clicks. No pedal edema. Gastrointestinal system: Abdomen is nondistended, soft and nontender. No organomegaly or masses felt. Normal bowel sounds heard. Central nervous system: Alert and oriented. No focal neurological deficits. No asterixis Extremities: Symmetric 5 x 5 power. Skin: No  rashes, lesions or ulcers     Data Reviewed: I have personally reviewed following labs and imaging studies  CBC:  Recent Labs Lab 12/16/15 1029 12/17/15 0700  WBC 4.3 4.5  NEUTROABS 3.0  --   HGB 12.3* 11.5*  HCT 36.4* 35.1*  MCV 98.9 101.2*  PLT 52* 54*   Basic Metabolic Panel:  Recent Labs Lab 12/16/15 1029 12/17/15 0700  NA 137 140  K 4.5 3.6  CL 109 112*  CO2 22 23  GLUCOSE 181* 113*  BUN 6 9  CREATININE 0.69 0.67  CALCIUM 8.7* 8.6*   GFR: Estimated Creatinine Clearance: 123.9 mL/min (by C-G formula based on Cr of 0.67). Liver Function Tests:  Recent Labs Lab 12/16/15 1029 12/17/15 0700  AST 64* 59*  ALT 40 37  ALKPHOS 161* 95  BILITOT 1.7* 2.5*  PROT 6.4* 5.7*  ALBUMIN 2.5* 2.4*   No results for input(s): LIPASE, AMYLASE in the last 168 hours.  Recent Labs Lab 12/16/15 1029 12/16/15 2052  AMMONIA 204* 68*   Coagulation Profile:  Recent Labs Lab 12/17/15 0700  INR 1.43   Cardiac Enzymes: No results for input(s): CKTOTAL, CKMB, CKMBINDEX, TROPONINI in the last 168 hours. BNP (last 3 results) No results for input(s): PROBNP in the last 8760 hours. HbA1C: No results for input(s): HGBA1C in the last 72 hours. CBG:  Recent Labs Lab 12/16/15 1729 12/16/15 2152 12/17/15 0757 12/17/15 1134  GLUCAP 126* 119* 113* 144*   Lipid Profile: No results for input(s): CHOL, HDL, LDLCALC, TRIG, CHOLHDL, LDLDIRECT in the last 72 hours. Thyroid Function Tests: No results for input(s): TSH, T4TOTAL, FREET4, T3FREE, THYROIDAB in the last 72 hours. Anemia Panel: No results for input(s): VITAMINB12, FOLATE, FERRITIN, TIBC, IRON, RETICCTPCT in the last 72 hours. Sepsis Labs:  Recent Labs Lab 12/16/15 1050  LATICACIDVEN 1.76    Recent Results (from the past 240 hour(s))  MRSA PCR Screening     Status: None   Collection Time: 12/16/15  9:49 PM  Result Value Ref Range Status   MRSA by PCR NEGATIVE NEGATIVE Final    Comment:        The  GeneXpert MRSA Assay (FDA approved for NASAL specimens only), is one component of a comprehensive MRSA colonization surveillance program. It is not intended to diagnose MRSA infection nor to guide or monitor treatment for MRSA infections.          Radiology Studies: X-ray Chest Pa And Lateral  12/17/2015  CLINICAL DATA:  60 year old male with shortness of breath and congestive heart failure EXAM: CHEST  2 VIEW COMPARISON:  Prior chest x-ray 09/26/2015 FINDINGS: Mild cardiomegaly. Negative for pulmonary edema, pneumothorax, pleural effusion or focal airspace consolidation. Very mild interstitial prominence and bronchitic changes similar compared to prior and likely chronic. No acute osseous abnormality. IMPRESSION: No active cardiopulmonary disease. Electronically Signed   By: [name] M.D.   On: 12/17/2015 12:08        Scheduled Meds: . atorvastatin  10 mg Oral Daily  . insulin aspart  0-9 Units Subcutaneous TID WC  . lactulose  30 g Oral TID  . nadolol  40 mg Oral Daily  . ranolazine  500 mg  Oral BID  . rifaximin  550 mg Oral BID  . sodium chloride flush  3 mL Intravenous Q12H  . spironolactone  12.5 mg Oral Daily   Continuous Infusions:    LOS: 1 day        Mauricio Gerome Apley, MD [facility] Pager [phone]  If 7PM-7AM, please contact night-coverage [email or web address] Password [facility] 12/17/2015, 12:18 PM

## 2015-12-18 DIAGNOSIS — K7290 Hepatic failure, unspecified without coma: Secondary | ICD-10-CM | POA: Diagnosis not present

## 2015-12-18 LAB — CBC WITH DIFFERENTIAL/PLATELET
BASOS ABS: 0 10*3/uL (ref 0.0–0.1)
Basophils Relative: 0 %
EOS PCT: 3 %
Eosinophils Absolute: 0.2 10*3/uL (ref 0.0–0.7)
HEMATOCRIT: 34.3 % — AB (ref 39.0–52.0)
Hemoglobin: 11.3 g/dL — ABNORMAL LOW (ref 13.0–17.0)
LYMPHS PCT: 31 %
Lymphs Abs: 1.5 10*3/uL (ref 0.7–4.0)
MCH: 32.7 pg (ref 26.0–34.0)
MCHC: 32.9 g/dL (ref 30.0–36.0)
MCV: 99.1 fL (ref 78.0–100.0)
MONO ABS: 0.6 10*3/uL (ref 0.1–1.0)
MONOS PCT: 13 %
NEUTROS ABS: 2.6 10*3/uL (ref 1.7–7.7)
Neutrophils Relative %: 53 %
Platelets: 64 10*3/uL — ABNORMAL LOW (ref 150–400)
RBC: 3.46 MIL/uL — ABNORMAL LOW (ref 4.22–5.81)
RDW: 15.4 % (ref 11.5–15.5)
WBC: 4.9 10*3/uL (ref 4.0–10.5)

## 2015-12-18 LAB — BASIC METABOLIC PANEL
ANION GAP: 7 (ref 5–15)
BUN: 7 mg/dL (ref 6–20)
CHLORIDE: 106 mmol/L (ref 101–111)
CO2: 23 mmol/L (ref 22–32)
Calcium: 8.7 mg/dL — ABNORMAL LOW (ref 8.9–10.3)
Creatinine, Ser: 0.74 mg/dL (ref 0.61–1.24)
GFR calc non Af Amer: >60 mL/min (ref >60)
Glucose, Bld: 138 mg/dL — ABNORMAL HIGH (ref 65–99)
POTASSIUM: 3.6 mmol/L (ref 3.5–5.1)
Sodium: 136 mmol/L (ref 135–145)

## 2015-12-18 LAB — GLUCOSE, CAPILLARY: GLUCOSE-CAPILLARY: 137 mg/dL — AB (ref 65–99)

## 2015-12-18 LAB — AMMONIA: Ammonia: 51 umol/L — ABNORMAL HIGH (ref 9–35)

## 2015-12-18 MED ORDER — NADOLOL 20 MG PO TABS
20.0000 mg | ORAL_TABLET | Freq: Every day | ORAL | Status: DC
  Filled 2015-12-18: qty 1

## 2015-12-18 NOTE — Discharge Summary (Signed)
Adam Butler, is a 60 y.o. male  DOB 1955/11/05  MRN [id number].  Admission date:  12/16/2015  Admitting Physician  Waldemar Dickens, MD  Discharge Date:  12/18/2015   Primary MD  [name], NP  Recommendations for primary care physician for things to follow:   Patient is being discharged home, instructions to continue to follow-up with his primary care provider, continue lactulose therapy. Follow a sodium restricted diet.   Admission Diagnosis  Hepatic encephalopathy (HCC) [K72.90]   Discharge Diagnosis  Hepatic encephalopathy ([place]) [K72.90]    Principal Problem:   Encephalopathy, hepatic (HCC) Active Problems:   HLD (hyperlipidemia)   DIASTOLIC HEART FAILURE, CHRONIC   Esophageal varices (HCC)   PFO (patent foramen ovale): Per TEE 04/20/2015   Tobacco abuse   Liver cirrhosis secondary to NASH   Type 2 diabetes mellitus (HCC)   GERD (gastroesophageal reflux disease)   Hepatic encephalopathy ([place])      Past Medical History  Diagnosis Date  . Cirrhosis ([place]) 2011    Cryptogenic, Likely NASH. Family/pt deny EtOH. HCV, HBV, HAV negative. ANA negative. AMA positive. Ascites 12/11  . Hyperlipemia   . Coronary artery disease     Inferior MI 12/11; LHC with occluded mid CFX and 80% proximal RCA. EF 55%. He had 3.0 x 28 vision BMS to CFX  . Diastolic CHF, acute ([place])     Echo 12/11 with ef 50-55% and mild LVH. EF 55% by LV0gram in 12/11  . Hypertension   . Type II diabetes mellitus ([place]) 2011  . SVT (supraventricular tachycardia) ([place])     1/12: appeared to be an ectopic atrial tachycardia. Required DCCV with hemodynamic instability  . GI bleed     12/11: Etiology not clearly defined. EGD: nonbleeding esophageal varices.  . S/P coronary artery stent placement 06/2010  . Esophageal varices ([place]) 2011, 2013    no hx acute variceal bleed  . Hepatic encephalopathy ([place]) 2011, 12/2013  .  Myocardial infarction [facility]) ? 2012  . Shortness of breath dyspnea   . Arthritis   . Hx of echocardiogram     Echo 5/16:  EF 55-60%, no RWMA, mod LAE  . Thrombocytopenia ([place])   . Cholelithiasis   . Rectal varices   . PFO (patent foramen ovale): Per TEE 04/20/2015 04/20/2015  . Bacteremia   . Portal hypertension ([place])   . Rectal varices     Past Surgical History  Procedure Laterality Date  . Cardiac catheterization  07/01/2010    BMS to CFX.  Marland Kitchen Appendectomy    . Orif r leg    . Esophagogastroduodenoscopy  04/13/2012    Procedure: ESOPHAGOGASTRODUODENOSCOPY (EGD);  Surgeon: Inda Castle, MD;  Location: [facility];  Service: Endoscopy;  Laterality: N/A;  . Colonoscopy  04/13/2012    Procedure: COLONOSCOPY;  Surgeon: Inda Castle, MD;  Location: [facility] ENDOSCOPY;  Service: Endoscopy;  Laterality: N/A;  . Esophagogastroduodenoscopy N/A 01/01/2014    Procedure: ESOPHAGOGASTRODUODENOSCOPY (EGD);  Surgeon: Jerene Bears, MD;  Location: [facility]  ENDOSCOPY;  Service: Endoscopy;  Laterality: N/A;  . Esophageal banding N/A 01/01/2014    Procedure: ESOPHAGEAL BANDING;  Surgeon: Jerene Bears, MD;  Location: [facility] ENDOSCOPY;  Service: Endoscopy;  Laterality: N/A;  . Coronary stent placement  06/30/2010    CFX   Distal        . Left heart catheterization with coronary angiogram N/A 10/15/2014    Procedure: LEFT HEART CATHETERIZATION WITH CORONARY ANGIOGRAM;  Surgeon: Peter M Martinique, MD;  Location: [facility] CATH LAB;  Service: Cardiovascular;  Laterality: N/A;  . Tee without cardioversion N/A 04/20/2015    Procedure: TRANSESOPHAGEAL ECHOCARDIOGRAM (TEE);  Surgeon: Fay Records, MD;  Location: [facility] ENDOSCOPY;  Service: Cardiovascular;  Laterality: N/A;       HPI  from the history and physical done on the day of admission:   This is a 60 year old male who presents to the hospital chief complaint of confusion. The history was limited due to patient's altered mentation,  he has had decompensation of his liver disease  in the past. On further questioning after patient's mentation improved, he admits been noncompliant with this diet restrictions and questionable about patient been compliant with his medications as well. On his initial physical examination his blood pressure systolic was 814, heart rate 58, respiratory 18, saturation 97%, patient was confused and disoriented, not able to follow commands and no responsive to pain stimuli, his lungs were clear to auscultation bilaterally, heart S1-S2 were present, no gallops or murmurs, his abdomen was soft, nontender nondistended, no obvious ascites, his lower extremities had no significant edema. His sodium was 137, potassium 4,5, creatinine 0.69, BUN 6, AST 64, ALT 40, ammonia level 204, with hemoglobin 12.3 and hematocrit 36.4 platelet count 52. His urinalysis was negative for infection, abdominal ultrasonography showed gallstones within the gallbladder largest 7 mm, positive cirrhosis with no significant ascites as well as splenomegaly. Chest x-ray was negative for infiltrates.  Patient was admitted to hospital working diagnosis of decompensated chronic liver disease with a portosystemic hepatic encephalopathy.     Hospital Course:    1. Cardiovascular. Patient remained stable, no signs of systemic infection, no signs of volume overload.  2. Pulmonary. No signs of aspiration or pulmonary infection. Patient had oximetry monitor and supplemental oxygen while he was in hospital.  3. Nephrology. Patient's kidney function remain stable, electrolytes within normal limits. Patient will resume his diuretic therapy at bedtime discharge. His potassium at time of discharge is 3.6. Patient on potassium sparing diuretic will recommend close follow-up as an outpatient of the kidney function and electrolytes  4. Gastroenterology. Patient was treated with lactulose initially rectally and then transition to oral route. Patient clinically improved, patient was continued on Aldactone  and his the nonselective beta blocker. Patient had no signs of ascites or  systemic infection. His decompensation is presumed to be related to  dietary indiscretion and medical noncompliance.  5. Neurology. Patient clinically improved with lactulose and supportive therapy. At time of discharge patient is ambulating and been very anxious to go home. Patient has been tolerating by mouth diet adequately.  6. Endocrinology. Patient was continued on insulin therapy while he was in hospital with adequate control of serum glucose.   Discharge Condition: Stable  Follow UP  Follow-up Information    Follow up with BAITY, [name], NP In 1 week.   Specialty:  Internal Medicine   Contact information:   [place] [address] [phone]        Consults obtained -  na  Diet and Activity recommendation: See Discharge Instructions below  Discharge Instructions     Discharge Instructions    Diet - low sodium heart healthy    Complete by:  As directed      Discharge instructions    Complete by:  As directed   Continue lactulose therapy as instructed.     Increase activity slowly    Complete by:  As directed              Discharge Medications       Medication List    TAKE these medications        aspirin 81 MG tablet  Take 81 mg by mouth daily.     atorvastatin 10 MG tablet  Commonly known as:  LIPITOR  Take 1 tablet (10 mg total) by mouth daily.     furosemide 40 MG tablet  Commonly known as:  LASIX  Take 1 tablet (40 mg total) by mouth daily.     insulin regular 100 units/mL injection  Commonly known as:  NOVOLIN R,HUMULIN R  According to sliding scale: 151-200: 2 units, 201-250 4 units, 251-300 6 units, 301-350 8 units. If > 400 call me.     lactulose 10 GM/15ML solution  Commonly known as:  CHRONULAC  TAKE 45 ML BY MOUTH 3 TIMES A DAY ON SCHEDULE - IF YOU HAVE NOT HAD A BOWEL MOVEMENT BY 1PM ON ANY GIVEN DAY, TAKE AN ADDITIONAL 45ML DOSE TO  ASSURE YOU HAVE A BOWEL MOVEMENT AND CONTINUE YOUR SCHEDULED 3X A DAY SCHEDULE ON TOP OF THAT     metFORMIN 1000 MG tablet  Commonly known as:  GLUCOPHAGE  Take 1,000 mg by mouth 2 (two) times daily.     multivitamin capsule  Take 1 capsule by mouth daily.     nadolol 40 MG tablet  Commonly known as:  CORGARD  Take 1 tablet (40 mg total) by mouth daily.     nitroGLYCERIN 0.4 MG SL tablet  Commonly known as:  NITROSTAT  Place 1 tablet (0.4 mg total) under the tongue every 5 (five) minutes as needed for chest pain (chest pain).     omeprazole 20 MG capsule  Commonly known as:  PRILOSEC  TAKE 1 CAPSULE (20 MG TOTAL) BY MOUTH DAILY.     ranolazine 500 MG 12 hr tablet  Commonly known as:  RANEXA  Take 1 tablet (500 mg total) by mouth 2 (two) times daily.     rifaximin 550 MG Tabs tablet  Commonly known as:  XIFAXAN  Take 1 tablet (550 mg total) by mouth 2 (two) times daily.     spironolactone 25 MG tablet  Commonly known as:  ALDACTONE  Take 0.5 tablets (12.5 mg total) by mouth daily.        Major procedures and Radiology Reports - PLEASE review detailed and final reports for all details, in brief -      X-ray Chest Pa And Lateral  12/17/2015  CLINICAL DATA:  60 year old male with shortness of breath and congestive heart failure EXAM: CHEST  2 VIEW COMPARISON:  Prior chest x-ray 09/26/2015 FINDINGS: Mild cardiomegaly. Negative for pulmonary edema, pneumothorax, pleural effusion or focal airspace consolidation. Very mild interstitial prominence and bronchitic changes similar compared to prior and likely chronic. No acute osseous abnormality. IMPRESSION: No active cardiopulmonary disease. Electronically Signed   By: [name] M.D.   On: 12/17/2015 12:08   US Abdomen Complete  12/04/2015  CLINICAL DATA:  Nonalcoholic  cirrhosis EXAM: ABDOMEN ULTRASOUND COMPLETE COMPARISON:  None. FINDINGS: Gallbladder: Gallstones are noted within gallbladder the largest measures 7 mm. No  thickening of gallbladder wall. No sonographic Murphy's sign. Common bile duct: Diameter: 3 mm in diameter within normal limits. Liver: Again noted nodular contour of the liver consistent with cirrhosis. No focal hepatic mass. Mild perihepatic ascites. IVC: No abnormality visualized. Pancreas: Visualized portion unremarkable. The pancreas is only partially visualized due to abundant bowel gas. Spleen: Size and appearance within normal limits. Measures 16.6 cm in length. Splenic volume measures 1692.7 cc Right Kidney: Length: 12.7 cm. Echogenicity within normal limits. No mass or hydronephrosis visualized. Left Kidney: Length: 14.9 cm. Echogenicity within normal limits. No mass or hydronephrosis visualized. Abdominal aorta: No aneurysm visualized. Measures up to 2.2 cm in diameter. Other findings: None. IMPRESSION: 1. Gallstones are noted within gallbladder the largest measures 7 mm. 2. No thickening of gallbladder wall. No sonographic Murphy's sign. Normal CBD. 3. Again noted nodular contour of the liver consistent with cirrhosis. No focal hepatic mass. Small perihepatic ascites. 4. Splenomegaly is noted.  Spleen measures 16.6 cm in length. 5. No hydronephrosis. 6. No aortic aneurysm. Electronically Signed   By: Lahoma Crocker M.D.   On: 12/04/2015 08:54    Micro Results     Recent Results (from the past 240 hour(s))  MRSA PCR Screening     Status: None   Collection Time: 12/16/15  9:49 PM  Result Value Ref Range Status   MRSA by PCR NEGATIVE NEGATIVE Final    Comment:        The GeneXpert MRSA Assay (FDA approved for NASAL specimens only), is one component of a comprehensive MRSA colonization surveillance program. It is not intended to diagnose MRSA infection nor to guide or monitor treatment for MRSA infections.        Today   Subjective    Adam Butler, Is awake and alert, denies any shortness of breath, chest pain or abdominal pain, has been out of bed and ambulating without  difficulty.  Objective   Blood pressure 104/51, pulse 58, temperature 98.4 F (36.9 C), temperature source Oral, resp. rate 18, height 5' 6"  (1.676 m), weight 124.5 kg (274 lb 7.6 oz), SpO2 99 %.   Intake/Output Summary (Last 24 hours) at 12/18/15 0834 Last data filed at 12/18/15 0055  Gross per 24 hour  Intake   2263 ml  Output   1400 ml  Net    863 ml    Exam Gen. Awake and alert Oral mucosa moist Lungs clear to auscultation bilaterally no wheezing rales or rhonchi Heart S1-S2 present rhythmic no gallops Or murmurs Abdomen soft, nontender, nondistended organomegaly Extremities no significant edema Neurologically patient nonfocal, no asterixis.   Data Review   CBC w Diff: Lab Results  Component Value Date   WBC 4.9 12/18/2015   HGB 11.3* 12/18/2015   HCT 34.3* 12/18/2015   PLT 64* 12/18/2015   LYMPHOPCT 31 12/18/2015   MONOPCT 13 12/18/2015   EOSPCT 3 12/18/2015   BASOPCT 0 12/18/2015    CMP: Lab Results  Component Value Date   NA 136 12/18/2015   K 3.6 12/18/2015   CL 106 12/18/2015   CO2 23 12/18/2015   BUN 7 12/18/2015   CREATININE 0.74 12/18/2015   CREATININE 0.74 03/15/2011   PROT 5.7* 12/17/2015   ALBUMIN 2.4* 12/17/2015   BILITOT 2.5* 12/17/2015   ALKPHOS 95 12/17/2015   AST 59* 12/17/2015   ALT 37 12/17/2015  .  Total Time in preparing paper work, data evaluation and todays exam - 45 minutes  Tawni Millers M.D on 12/18/2015 at 8:34 AM  [facility]   Office  [phone]

## 2015-12-18 NOTE — Progress Notes (Signed)
Pt refusing bed alarm. Pt was educated. Will continue to monitor.

## 2015-12-18 NOTE — Care Management Important Message (Signed)
Important Message  Patient Details  Name: Adam Butler MRN: [id number] Date of Birth: February 21, 1956   Medicare Important Message Given:  Yes    Kandee Escalante Abena 12/18/2015, 11:20 AM

## 2015-12-18 NOTE — Progress Notes (Signed)
Discharge instructions and medications reviewed with patient. PIV removed. No questions at this time.

## 2015-12-21 ENCOUNTER — Encounter: Payer: Self-pay | Admitting: Internal Medicine

## 2015-12-21 ENCOUNTER — Ambulatory Visit (INDEPENDENT_AMBULATORY_CARE_PROVIDER_SITE_OTHER): Payer: PPO | Admitting: Internal Medicine

## 2015-12-21 ENCOUNTER — Other Ambulatory Visit: Payer: Self-pay

## 2015-12-21 VITALS — BP 106/60 | HR 69 | Temp 97.9°F | Wt 274.0 lb

## 2015-12-21 DIAGNOSIS — K7581 Nonalcoholic steatohepatitis (NASH): Secondary | ICD-10-CM | POA: Diagnosis not present

## 2015-12-21 DIAGNOSIS — K7682 Hepatic encephalopathy: Secondary | ICD-10-CM

## 2015-12-21 DIAGNOSIS — K7290 Hepatic failure, unspecified without coma: Secondary | ICD-10-CM | POA: Diagnosis not present

## 2015-12-21 NOTE — Progress Notes (Signed)
Pre visit review using our clinic review tool, if applicable. No additional management support is needed unless otherwise documented below in the visit note. 

## 2015-12-21 NOTE — Patient Outreach (Signed)
This RNCM was successful in making telephone contact for community care coordination. Patient identified himself by providing date of birth and address.  Patient was recently discharged form the acute care setting with a diagnosis of heart failure. Patient reports having a hard time in affording foods on his low sodium diet ordered for assisting in controlling heart failure. Patient also stated he does not have a scale, this RNCM sent request for scales to be sent from [facility] to patient to assist with daily weights.  Patient advised to weigh daily, first thing in the morning, after using the bathroom and with the same amount of clothing.  Patient also advised that his case manager would returning to work next week, would scale an appointment for a home visit.  Patient and this RNCM collaborated to form an initial care plan, advised patient that his Case Manager, Richarda Osmond, would assist him in developing a more in dept care plan.  Plan: Update patient's case manager upon her return from vacation next week

## 2015-12-21 NOTE — Progress Notes (Signed)
Subjective:    Patient ID: Adam Butler, male    DOB: [birth year], 60 y.o.   MRN: [id number]  HPI  Pt presents to the clinic today for hospital follow up for hepatic encephalopathy. He presented to the ER 6/7 for AMS. Ammonia was 204. He was admitted for observation. He was discharged 6/9. No medications were changed. He remains on Xifaxin and Lactulose. He last saw Dr. Hilarie Fredrickson 11/2015- note reviewed. He is having at least 5-6 BM's per day.   Review of Systems      Past Medical History  Diagnosis Date  . Cirrhosis ([place]) 2011    Cryptogenic, Likely NASH. Family/pt deny EtOH. HCV, HBV, HAV negative. ANA negative. AMA positive. Ascites 12/11  . Hyperlipemia   . Coronary artery disease     Inferior MI 12/11; LHC with occluded mid CFX and 80% proximal RCA. EF 55%. He had 3.0 x 28 vision BMS to CFX  . Diastolic CHF, acute ([place])     Echo 12/11 with ef 50-55% and mild LVH. EF 55% by LV0gram in 12/11  . Hypertension   . Type II diabetes mellitus ([place]) 2011  . SVT (supraventricular tachycardia) ([place])     1/12: appeared to be an ectopic atrial tachycardia. Required DCCV with hemodynamic instability  . GI bleed     12/11: Etiology not clearly defined. EGD: nonbleeding esophageal varices.  . S/P coronary artery stent placement 06/2010  . Esophageal varices ([place]) 2011, 2013    no hx acute variceal bleed  . Hepatic encephalopathy ([place]) 2011, 12/2013  . Myocardial infarction [facility]) ? 2012  . Shortness of breath dyspnea   . Arthritis   . Hx of echocardiogram     Echo 5/16:  EF 55-60%, no RWMA, mod LAE  . Thrombocytopenia ([place])   . Cholelithiasis   . Rectal varices   . PFO (patent foramen ovale): Per TEE 04/20/2015 04/20/2015  . Bacteremia   . Portal hypertension ([place])   . Rectal varices     Current Outpatient Prescriptions  Medication Sig Dispense Refill  . aspirin 81 MG tablet Take 81 mg by mouth daily.     Marland Kitchen atorvastatin (LIPITOR) 10 MG tablet Take 1 tablet (10 mg total) by mouth  daily. 90 tablet 1  . furosemide (LASIX) 40 MG tablet Take 1 tablet (40 mg total) by mouth daily. 30 tablet 3  . insulin regular (NOVOLIN R,HUMULIN R) 100 units/mL injection According to sliding scale: 151-200: 2 units, 201-250 4 units, 251-300 6 units, 301-350 8 units. If > 400 call me. 10 mL 11  . lactulose (CHRONULAC) 10 GM/15ML solution TAKE 45 ML BY MOUTH 3 TIMES A DAY ON SCHEDULE - IF YOU HAVE NOT HAD A BOWEL MOVEMENT BY 1PM ON ANY GIVEN DAY, TAKE AN ADDITIONAL 45ML DOSE TO ASSURE YOU HAVE A BOWEL MOVEMENT AND CONTINUE YOUR SCHEDULED 3X A DAY SCHEDULE ON TOP OF THAT 4800 mL 2  . metFORMIN (GLUCOPHAGE) 1000 MG tablet Take 1,000 mg by mouth 2 (two) times daily.  11  . Multiple Vitamin (MULTIVITAMIN) capsule Take 1 capsule by mouth daily.      . nadolol (CORGARD) 40 MG tablet Take 1 tablet (40 mg total) by mouth daily. 30 tablet 2  . nitroGLYCERIN (NITROSTAT) 0.4 MG SL tablet Place 1 tablet (0.4 mg total) under the tongue every 5 (five) minutes as needed for chest pain (chest pain). 25 tablet 5  . omeprazole (PRILOSEC) 20 MG capsule TAKE 1 CAPSULE (20 MG TOTAL) BY MOUTH  DAILY. 30 capsule 8  . ranolazine (RANEXA) 500 MG 12 hr tablet Take 1 tablet (500 mg total) by mouth 2 (two) times daily. 60 tablet 2  . rifaximin (XIFAXAN) 550 MG TABS tablet Take 1 tablet (550 mg total) by mouth 2 (two) times daily. 60 tablet 2  . spironolactone (ALDACTONE) 25 MG tablet Take 0.5 tablets (12.5 mg total) by mouth daily. 30 tablet 2   No current facility-administered medications for this visit.    Allergies  Allergen Reactions  . Isosorbide Nitrate Other (See Comments)    Nose bleeds    Family History  Problem Relation Age of Onset  . Heart attack Brother 8    MI  . Diabetes Brother   . Heart attack Father   . Diabetes Father   . COPD Father   . COPD Mother   . Stroke Neg Hx   . Heart attack Brother     Social History   Social History  . Marital Status: Married    Spouse Name: N/A  . Number  of Children: 3  . Years of Education: N/A   Occupational History  . Unemployed Other    Worked in maintenance prior   Social History Main Topics  . Smoking status: Current Every Day Smoker -- 1.00 packs/day for 36 years    Types: E-cigarettes  . Smokeless tobacco: Never Used     Comment: uses vapor cigarettes (2016 )  . Alcohol Use: No  . Drug Use: No  . Sexual Activity: No   Other Topics Concern  . Not on file   Social History Narrative   Married   Gets regular exercise: walking     Constitutional: Pt reports fatigue. Denies fever, malaise, headache or abrupt weight changes.  Respiratory: Denies difficulty breathing, shortness of breath, cough or sputum production.   Cardiovascular: Denies chest pain, chest tightness, palpitations or swelling in the hands or feet.  Gastrointestinal: Pt reports diarrhea. Denies abdominal pain, bloating, constipation, or blood in the stool.  Neurological: Denies dizziness, difficulty with memory, difficulty with speech or problems with balance and coordination.  Psych: Pt reports stress. Denies anxiety, depression, SI/HI.  No other specific complaints in a complete review of systems (except as listed in HPI above).  Objective:   Physical Exam   BP 106/60 mmHg  Pulse 69  Temp(Src) 97.9 F (36.6 C) (Oral)  Wt 274 lb (124.286 kg)  SpO2 97% Wt Readings from Last 3 Encounters:  12/21/15 274 lb (124.286 kg)  12/16/15 274 lb 7.6 oz (124.5 kg)  12/01/15 274 lb 6.4 oz (124.467 kg)    General: Appears his stated age, obese in NAD. Cardiovascular: Normal rate and rhythm. S1,S2 noted.  No murmur, rubs or gallops noted. Pulmonary/Chest: Normal effort and positive vesicular breath sounds. No respiratory distress. No wheezes, rales or ronchi noted.  Abdomen: Soft and nontender. Normal bowel sounds. Neurological: Alert and oriented.  Psychiatric: Inappropriate comments again today.  BMET    Component Value Date/Time   NA 136 12/18/2015 0557    K 3.6 12/18/2015 0557   CL 106 12/18/2015 0557   CO2 23 12/18/2015 0557   GLUCOSE 138* 12/18/2015 0557   BUN 7 12/18/2015 0557   CREATININE 0.74 12/18/2015 0557   CREATININE 0.74 03/15/2011 0827   CALCIUM 8.7* 12/18/2015 0557   GFRNONAA >60 12/18/2015 0557   GFRAA >60 12/18/2015 0557    Lipid Panel     Component Value Date/Time   CHOL 133 09/29/2015 1317   TRIG  106.0 09/29/2015 1317   HDL 37.90* 09/29/2015 1317   CHOLHDL 4 09/29/2015 1317   VLDL 21.2 09/29/2015 1317   LDLCALC 74 09/29/2015 1317    CBC    Component Value Date/Time   WBC 4.9 12/18/2015 0557   RBC 3.46* 12/18/2015 0557   HGB 11.3* 12/18/2015 0557   HCT 34.3* 12/18/2015 0557   PLT 64* 12/18/2015 0557   MCV 99.1 12/18/2015 0557   MCH 32.7 12/18/2015 0557   MCHC 32.9 12/18/2015 0557   RDW 15.4 12/18/2015 0557   LYMPHSABS 1.5 12/18/2015 0557   MONOABS 0.6 12/18/2015 0557   EOSABS 0.2 12/18/2015 0557   BASOSABS 0.0 12/18/2015 0557    Hgb A1C Lab Results  Component Value Date   HGBA1C 6.7* 09/29/2015        Assessment & Plan:   Hospital follow up for hepatic encephalopathy:  Hospital notes, labs reviewed Continue Lactulose, Xifaximin He will follow up with Dr. Hilarie Fredrickson as scheduled  RTC as needed or if symptoms persist or worsen

## 2015-12-21 NOTE — Patient Instructions (Signed)
Hepatic Encephalopathy Hepatic encephalopathy is a loss of brain function from advanced liver disease. The effects of the condition depend on the type of liver damage and how severe it is. In some cases, hepatic encephalopathy can be reversed. CAUSES The exact cause of hepatic encephalopathy is not known. RISK FACTORS You have a higher risk of getting this condition if your liver is damaged. When the liver is damaged harmful substances called toxins can build up in the body. Certain toxins, such as ammonia, can harm your brain. Conditions that can cause liver damage include:  An infection.  Dehydration.  Intestinal bleeding.  Drinking too much alcohol.  Taking certain medicines, including tranquilizers, water pills (diuretics), antidepressants, or sleeping pills. SIGNS AND SYMPTOMS Signs and symptoms may develop suddenly. Or, they may develop slowly and get worse gradually. Symptoms can range from mild to severe. Mild Hepatic Encephalopathy  Mild confusion.  Personality and mood changes.  Anxiety and agitation.  Drowsiness.  Loss of mental abilities.  Musty or sweet-smelling breath. Worsening or Severe Hepatic Encephalopathy  Slowed movement.  Slurred speech.  Extreme personality changes.  Disorientation.  Abnormal shaking or flapping of the hands.  Coma. DIAGNOSIS To make a diagnosis, your health care provider will do a physical exam. To rule out other causes of your signs and symptoms, he or she may order tests. You may have:  Blood tests. These may be done to check your ammonia level, measure how long it takes your blood to clot, and check for infection.  Liver function tests. These may be done to check how well your liver is working.  MRI and CT scans. These may be done to check for a brain disorder.  Electroencephalogram (EEG). This may be done to measure the electrical activity in your brain. TREATMENT The first step in treatment is identifying and  treating possible triggers. The next step is involves taking medicine to lower the level of toxins in the body and to prevent ammonia from building up. You may need to take:  Antibiotics to reduce the ammonia-producing bacteria in your gut.  Lactulose to help flush ammonia from the gut. HOME CARE INSTRUCTIONS Eating and Drinking  Follow a low-protein diet that includes plenty of fruits, vegetables, and whole grains, as directed by your health care provider. Ammonia is produced when you digest high-protein foods.  Work with a Microbiologist or with your health care provider to make sure you are getting the right balance of protein and minerals.  Drink enough fluids to keep your urine clear or pale yellow. Drinking plenty of water helps prevent constipation.  Do not drink alcohol or use illegal drugs. Medicines  Only take medicine as directed by your health care provider.  If you were prescribed an antibiotic medicine, finish it all even if you start to feel better.  Do not start any new medicines, including over-the-counter medicines, without first checking with your health care provider. SEEK MEDICAL CARE IF:  You have new symptoms.  Your symptoms change.  Your symptoms get worse.  You have a fever.  You are constipated.  You have persistent nausea, vomiting, or diarrhea. SEEK IMMEDIATE MEDICAL CARE IF:  You become very confused or drowsy.  You vomit blood or material that looks like coffee grounds.  Your stool is bloody or black or looks like tar.   This information is not intended to replace advice given to you by your health care provider. Make sure you discuss any questions you have with your health care provider.  Document Released: 09/06/2006 Document Revised: 07/18/2014 Document Reviewed: 02/12/2014 Elsevier Interactive Patient Education [employer].

## 2015-12-28 ENCOUNTER — Other Ambulatory Visit: Payer: Self-pay | Admitting: Physician Assistant

## 2015-12-30 ENCOUNTER — Other Ambulatory Visit: Payer: Self-pay | Admitting: *Deleted

## 2015-12-30 NOTE — Patient Outreach (Signed)
[place] [facility]) Care Management [place] Telephone Outreach, Transition of Care, day 10 12/30/2015  Adam Butler 12-29-1955 [id number]  Unsuccessful telephone outreach to Adam Butler, 60 y/o male followed by [place] after recent IP hospital visit June 7-9, 2017 for confusion, hepatic encephalopathy, CHF, and questionable diet/ medication compliance.  HIPAA compliant voice mail left for patient, asking him to return my phone call.  Plan:  If I do not hear back from Adam Butler, will re-attempt [place] telephone outreach for continued transition of care later this week.   [name], RN, BSN, [employer] [facility] Care Management  [phone]

## 2016-01-01 ENCOUNTER — Other Ambulatory Visit: Payer: Self-pay | Admitting: *Deleted

## 2016-01-01 NOTE — Patient Outreach (Signed)
[place] [facility]) Care Management [place] Telephone Outreach, Transition of Care day 12 01/01/2016  ANGELUS HOOPES 15-Jan-1956 [id number]  Successful telephone outreach to Mr. Frazer [place], 60 y/o male, followed by [place] after recent IP hospital visit June 7-9, 2017 for confusion, hepatic encephalopathy, CHF, and questionable diet/ medication compliance.  HIPAA verified.  Today, Mr. Tolliver states that he continues to do well since his discharge from the hospital.  Mr. Quillin states that he received the scale that was mailed to him, and expressed appreciation for them, stating that they "are much better" than the scales he used to have.  Mr. Seamans reports that he has been every day, but has not yet started recording his weights on paper.  He reports that his weights have consistently been maintained at 270-274 pounds since his discharge from the hospital.  I encouraged Mr. Lanier Clam to begin recording his weights daily, and explained the rationale for doing so, and he agreed.  Mr. Uptain states that he has been taking his medications as they are prescribed and states that he has attended all scheduled provider appointments.  Mr. Rountree states that saw his PCP, Shanon Ace last week, and reports that he "got a good report."  I explained [facility] CM services to Mr. Dani and made sure that he had my direct phone line, as well as the 24-hour [facility] nurse phone line number, and the main [facility] CM phone number should he have questions or needs prior to our [place] in home visit, which we were able to schedule today during our phone conversation.  Plan:  Mr. Quirk will take his medications as they are prescribed and will attend all scheduled provider appointments.  Mr. Epps will begin to record his daily weights on paper.  Mr. Ghuman will notify his providers for any concerns, questions, needs, or problems that arise.  [facility] CM in-home visit scheduled for January 11, 2016.     [name], RN, BSN, [employer] [facility] Care Management  [phone]

## 2016-01-04 ENCOUNTER — Other Ambulatory Visit: Payer: Self-pay | Admitting: Internal Medicine

## 2016-01-04 ENCOUNTER — Other Ambulatory Visit: Payer: Self-pay | Admitting: Physician Assistant

## 2016-01-06 ENCOUNTER — Ambulatory Visit: Payer: Self-pay | Admitting: *Deleted

## 2016-01-06 ENCOUNTER — Other Ambulatory Visit: Payer: Self-pay | Admitting: *Deleted

## 2016-01-06 NOTE — Patient Outreach (Signed)
[place] [facility]) Care Management [place] Telephone Outreach, Transition of Care day 17 01/06/2016  Adam Butler 1956/01/11 [id number]  Unsuccessful telephone outreach to [name], 60 y/o male followed by [place] after recent IP hospital visit June 7-9, 2017 for confusion, hepatic encephalopathy, CHF, and questionable diet/ medication compliance. HIPAA compliant voice mail left for patient, asking him to return my phone call.  Plan:  If I do not hear back from [name], will re-attempt [place] telephone outreach for continued transition of care later this week.  [name], RN, BSN, [employer] [facility] Care Management  [phone]

## 2016-01-08 ENCOUNTER — Other Ambulatory Visit: Payer: Self-pay | Admitting: *Deleted

## 2016-01-08 ENCOUNTER — Ambulatory Visit: Payer: PPO

## 2016-01-08 ENCOUNTER — Ambulatory Visit: Payer: Self-pay | Admitting: *Deleted

## 2016-01-08 NOTE — Patient Outreach (Signed)
[place] [facility]) Care Management [place] Telephone Outreach, Transition of Care day 19 01/08/2016  [name] 03-Dec-1955 [id number]  Successful telephone outreach from Adam Butler, 60 y/o male followed by [place] after recent IP hospital visit June 7-9, 2017 for confusion, hepatic encephalopathy, CHF, and questionable diet/ medication compliance.   [name] returned my call from earlier this morning, and reports today that he "is doing great," and having "no problems."  [name] stated that he has been monitoring and recording his daily weights as requested, and denies needs, problems, issues or concerns.  During phone call, we confirmed previously scheduled in-home visit for Monday January 11, 2016.  I confirmed that [name] has phone numbers for [facility] CM 24-hour nurse call line, as well as my direct phone number during today's phone conversation.  Plan:  [name] initial home visit outreach scheduled for continued transition of care on Monday, January 11, 2016  [name], RN, BSN, [place] Care Management  [phone]

## 2016-01-08 NOTE — Patient Outreach (Signed)
[place] [facility]) Care Management [place] Telephone Outreach, Transition of Care, day 19 01/08/2016  ETHIN DRUMMOND [birth year] [id number]   Unsuccessful telephone outreach to [name], 60 y/o male followed by [place] after recent IP hospital visit June 7-9, 2017 for confusion, hepatic encephalopathy, CHF, and questionable diet/ medication compliance. HIPAA compliant voice mail left for patient, asking him to return my phone call, and reminding him of our previously scheduled in-home visit for Monday January 11, 2016.  Plan:  If I do not hear back from Mr. Balistreri this afternoon, [place] initial home visit outreach scheduled for continued transition of care on Monday, January 11, 2016.   [name], RN, BSN, [employer] [facility] Care Management  [phone]

## 2016-01-11 ENCOUNTER — Other Ambulatory Visit: Payer: Self-pay | Admitting: *Deleted

## 2016-01-11 ENCOUNTER — Encounter: Payer: Self-pay | Admitting: *Deleted

## 2016-01-11 NOTE — Patient Outreach (Signed)
[place] [facility]) Care Management  Adam Butler Initial Home Visit, Transition of Care day 22 01/11/2016  Adam Butler 1956/01/10 [id number]  Adam Butler is an 60 y.o. male followed by [place] after recent IP hospital visit June 7-9, 2017 for confusion, hepatic encephalopathy, CHF, and questionable diet/ medication compliance.  Today, Adam Butler states that he is feeling "much better," and believes that he "is doing well."  Adam Butler reports that he has been taking his medications as they are prescribed and he verbalized a good understanding of the purpose, dosing, and schedule of his regularly prescribed medications.  Adam Butler reports that he fills a pill box weekly and administers his medications independently.  Patient was recently discharged from hospital and all medications were thoroughly reviewed with him today.  Adam Butler reports that he has attended all scheduled provider appointments and verbalized that he would like to have a professional pedicure to have his nail clipped.  Adam Butler pointed out patient's "one nail" on (L) great toe that is thickened and discolored grayish compared to other toenails.  Patient denies pain in toe, and I advised patient to inquire with his PCP about recommendations for foot care, given that Adam Butler reported that he "is not sure," if his PCP has seen his toenail.  Patient agreed to inquire about possibility of having PVP evaluation of this toenail prior to having a pedicure.  Adam Butler stated that he has been monitoring and recording his daily weight, as requested with previous [place] telephone outreach, which we reviewed together today.  Adam Butler weight has been in a general range 272.2- 273.6 pounds, with a weight this morning of 273.6.  During our time together today, we discussed general CHF zones and corresponding action plans, as well as weight gain guidelines.  Adam Butler states that he monitors his blood sugars every morning and reports a blood glucose this morning of 128.  Adam Butler also reports that he "is practically blind" in his (L) eye, "for years," due to cataract surgery that he was told he needs.  Adam Butler stated that he had cataract surgery on his (R) eye "sometime in 2010," and reports that he "doesn't think" his insurance provider will cover cataract surgery for his (L) eye.  Patient admitted that he had not inquired about this possibility through his insurance company, and agrees to do so.  Adam Butler reports excellent family support through his wife and caregiver, Adam Butler, and he denies community resource needs today.  Adam Butler states that he "has extra stress," around a family member who is currently living in the home with he and his wife, stating that "he will be moving out soon, and I think I will have less stress when that happens."      [name] denies further needs, questions, problems, or issues today, and agrees to continued [place] outreach for self-management of CHF and general health maintenance.    Subjective: "Last December they told me I didn't have much longer to live; I might not be able to do everything I used to, but I think I am doing good and am very interested in continuing to live."  Objective:   BP 114/58 mmHg  Pulse 64  Resp 16  Wt 273 lb 9.6 oz (124.104 kg)  SpO2 98%    BP 114/58 mmHg  Pulse 64  Resp 16  Wt 273 lb 9.6 oz (124.104 kg)  SpO2 98%  Review of Systems  Constitutional: Negative.  Negative for fever, weight loss and malaise/fatigue.  HENT: Negative for nosebleeds.   Eyes:       Patient states he cannot see out of his (L) eye; stating that he needs cataract surgery in that eye  Respiratory: Negative for cough, shortness of breath and wheezing.        Patient reports occasionally SOB, occasional wheezing; none noted or reported today  Cardiovascular: Positive for leg  swelling. Negative for chest pain and palpitations.       Patient has thickened/ gray colored toenail noted on (L) great toe; this nail appears differently from all other toenails.   Gastrointestinal: Negative.  Negative for nausea and abdominal pain.  Genitourinary: Negative.   Musculoskeletal: Positive for falls. Negative for myalgias.  Neurological: Negative.  Negative for weakness and headaches.  Psychiatric/Behavioral: Negative for depression. The patient is not nervous/anxious.     Physical Exam  Constitutional: He is oriented to person, place, and time. He appears well-developed and well-nourished.  Cardiovascular: Normal rate, regular rhythm, normal heart sounds and intact distal pulses.   Pulses:      Radial pulses are 2+ on the right side, and 2+ on the left side.       Dorsalis pedis pulses are 2+ on the right side, and 2+ on the left side.  Respiratory: Effort normal and breath sounds normal. No respiratory distress. He has no wheezes. He has no rales.  GI: Soft. Bowel sounds are normal.  Musculoskeletal: He exhibits edema.  +1 bilateral pedal edema noted  Neurological: He is alert and oriented to person, place, and time.  Skin: Skin is warm and dry.  Psychiatric: He has a normal mood and affect. His behavior is normal. Judgment and thought content normal.    Encounter Medications:   Outpatient Encounter Prescriptions as of 01/11/2016  Medication Sig  . aspirin 81 MG tablet Take 81 mg by mouth daily.   Marland Kitchen atorvastatin (LIPITOR) 10 MG tablet Take 1 tablet (10 mg total) by mouth daily.  . furosemide (LASIX) 40 MG tablet Take 1 tablet (40 mg total) by mouth daily.  . insulin regular (NOVOLIN R,HUMULIN R) 100 units/mL injection According to sliding scale: 151-200: 2 units, 201-250 4 units, 251-300 6 units, 301-350 8 units. If > 400 call me.  . metFORMIN (GLUCOPHAGE) 1000 MG tablet Take 1,000 mg by mouth 2 (two) times daily.  . Multiple Vitamin (MULTIVITAMIN) capsule Take 1  capsule by mouth daily.    . nadolol (CORGARD) 40 MG tablet TAKE 1 TABLET BY MOUTH DAILY  . nitroGLYCERIN (NITROSTAT) 0.4 MG SL tablet Place 1 tablet (0.4 mg total) under the tongue every 5 (five) minutes as needed for chest pain (chest pain).  Marland Kitchen omeprazole (PRILOSEC) 20 MG capsule TAKE 1 CAPSULE (20 MG TOTAL) BY MOUTH DAILY.  . ranolazine (RANEXA) 500 MG 12 hr tablet Take 1 tablet (500 mg total) by mouth 2 (two) times daily.  . ranolazine (RANEXA) 500 MG 12 hr tablet Take 1 tablet (500 mg total) by mouth 2 (two) times daily. Please call and schedule a six month follow up appointment.  . rifaximin (XIFAXAN) 550 MG TABS tablet Take 1 tablet (550 mg total) by mouth 2 (two) times daily.  Marland Kitchen spironolactone (ALDACTONE) 25 MG tablet Take 0.5 tablets (12.5 mg total) by mouth daily.  Marland Kitchen lactulose (CHRONULAC) 10 GM/15ML solution TAKE 45 ML BY MOUTH 3 TIMES A DAY ON SCHEDULE - IF YOU HAVE NOT HAD A BOWEL  MOVEMENT BY 1PM ON ANY GIVEN DAY, TAKE AN ADDITIONAL 45ML DOSE TO ASSURE YOU HAVE A BOWEL MOVEMENT AND CONTINUE YOUR SCHEDULED 3X A DAY SCHEDULE ON TOP OF THAT   No facility-administered encounter medications on file as of 01/11/2016.    Functional Status:   In your present state of health, do you have any difficulty performing the following activities: 01/11/2016 12/16/2015  Hearing? N N  Vision? Y N  Difficulty concentrating or making decisions? N N  Walking or climbing stairs? N N  Dressing or bathing? N N  Doing errands, shopping? N N  Preparing Food and eating ? N -  Using the Toilet? N -  In the past six months, have you accidently leaked urine? N -  Do you have problems with loss of bowel control? N -  Managing your Medications? N -  Managing your Finances? N -  Housekeeping or managing your Housekeeping? N -    Fall/Depression Screening:    PHQ 2/9 Scores 01/11/2016 12/15/2015 10/22/2015 09/01/2015 07/22/2015 04/23/2015  PHQ - 2 Score 0 0 0 0 0 4  PHQ- 9 Score - - - - - 8    Assessment:  Mr.  Palmateer is feeling better after his recent hospitalization and is taking his medications as prescribed and attending all scheduled provider appointments.  Mr. Titsworth would like to have cataract surgery in his (L) eye, but believes that this will not be covered through his insurance.  Mr. Ciullo has trouble clipping his toenails and would like to have regular professional pedicures, but has a toe nail which needs to be evaluated prior to doing so.  Mr. Rennels is committed to self-health management and would like to learn more about self-health management of CHF.   Plan:   Mr. Maready will take his medications as they are prescribed and will attend all scheduled provider appointments.  Mr. Schewe will continue to monitor and record his daily weights.  Mr. Hovis will contact his insurance provider to inquire about details of coverage for cataract surgery.  Mr. Borton will contact his PCP to have a formal foot exam performed, as well as to inquire about directives for foot care around his (L) great toenail, which appears visually to be thicked and discolored from other toenails.  Mr. Friede will notify his providers for any concerns, questions, needs, or problems that arise.  I appreciate the opportunity to participate in mr. Hefferan's care,

## 2016-01-20 ENCOUNTER — Other Ambulatory Visit: Payer: Self-pay | Admitting: *Deleted

## 2016-01-20 NOTE — Patient Outreach (Signed)
[facility]) Care Management [place] Telephone Outreach, Transition of Care day 31 01/20/2016  Adam Butler 05/28/1956 [id number]  Successful telephone outreach to Adam Butler is an 60 y.o. male followed by [place] after recent IP hospital visit June 7-9, 2017 for confusion, hepatic encephalopathy, CHF, and questionable diet/ medication compliance.HIPAA verified.  Today, Adam Butler states that he is feeling "okay," reporting that he "had a spell the other day," in which he "felt bad, and was out of it," and was told by his wife later that he was "acting like his ammonia level was high."  Adam Butler could not remember exactly which day this occurred on, and was vague in describing both his symptoms as well as how he got better.  Adam Butler reported that his wife "gave me my lactulose," and stated, "I didn't have to go back to the hospital and I am fine now, that is the main thing I care about."  Adam Butler reports that he did not call his doctor to report this "spell" because he "got better."   Adam Butler reports that he has been taking his medications as they are prescribed, but reports that he has not been consistently monitoring or recording his daily weights, nor has he placed any calls to his PCP to have his (L) great toe nail evaluated/ clipped, nor has he called his insurance company to inquire about cataract surgery coverage, all of which he agreed to do at the time of [place] initial home visit on January 11, 2016.  Adam Butler stated that he "just forgets to do it."  During our phone call conversation, I again re-iterated the importance of him following up on these issues, and Adam Butler agreed to do so prior to our next [place] in-home visit, which is scheduled for next month.  We briefly discussed the possibility that Adam Butler may be ready for [facility] CM discharge, as he has completed his transition of care without  hospital re-admission and voices understanding of the importance of action items for self-health management, but thus far, has not taken and/or maintained action to follow up and address his ongoing issues.  Adam Butler was agreeable to this general plan.  Plan:  Adam Butler will take his medications as they are prescribed and will attend all scheduled provider appointments.  Adam Butler will again begin to monitor and record his daily weights.  Adam Butler will contact his insurance provider to inquire about details of coverage for cataract surgery.  Adam Butler will contact his PCP to have a formal foot exam performed, as well as to inquire about directives for foot care around his (L) great toenail, which appears visually to be thicked and discolored when compared to his other toenails.  Adam Butler will notify his providers for any concerns, questions, needs, or problems that arise.  [place] in-home visit, possibly for discharge, scheduled for next month.  [name], RN, BSN, [employer] [facility] Care Management  [phone]

## 2016-01-26 ENCOUNTER — Encounter: Payer: Self-pay | Admitting: Internal Medicine

## 2016-01-26 ENCOUNTER — Ambulatory Visit (INDEPENDENT_AMBULATORY_CARE_PROVIDER_SITE_OTHER): Payer: PPO | Admitting: Internal Medicine

## 2016-01-26 VITALS — BP 116/64 | HR 59 | Temp 98.3°F | Wt 278.0 lb

## 2016-01-26 DIAGNOSIS — B351 Tinea unguium: Secondary | ICD-10-CM

## 2016-01-26 DIAGNOSIS — H269 Unspecified cataract: Secondary | ICD-10-CM | POA: Diagnosis not present

## 2016-01-26 NOTE — Progress Notes (Signed)
Subjective:    Patient ID: Adam Butler, male    DOB: 15-Apr-1956, 60 y.o.   MRN: [id number]  HPI  Pt presents to the clinic today with complaint of nail changes on left foot.  He is unsure of when symptoms began, and reports the toenails have changed in color and the nail on the great toe appears to be cracked.  Shoes are occasionally bothersome, but otherwise he denies pain.  He has not tried any medications to improve symptoms.  He is interested in a referral to a podiatrist.  He also complains of a cataract of the left eye.  He was previously treated at [facility] and a cataract was removed from the right eye in 2010.  He reports blurred vision in the left eye, but he is able to see light.  He denies eye pain or discharge from the eye.  He is interested in a referral to an ophthalmologist.     Review of Systems   Past Medical History  Diagnosis Date  . Cirrhosis ([place]) 2011    Cryptogenic, Likely NASH. Family/pt deny EtOH. HCV, HBV, HAV negative. ANA negative. AMA positive. Ascites 12/11  . Hyperlipemia   . Coronary artery disease     Inferior MI 12/11; LHC with occluded mid CFX and 80% proximal RCA. EF 55%. He had 3.0 x 28 vision BMS to CFX  . Diastolic CHF, acute ([place])     Echo 12/11 with ef 50-55% and mild LVH. EF 55% by LV0gram in 12/11  . Hypertension   . Type II diabetes mellitus ([place]) 2011  . SVT (supraventricular tachycardia) ([place])     1/12: appeared to be an ectopic atrial tachycardia. Required DCCV with hemodynamic instability  . GI bleed     12/11: Etiology not clearly defined. EGD: nonbleeding esophageal varices.  . S/P coronary artery stent placement 06/2010  . Esophageal varices ([place]) 2011, 2013    no hx acute variceal bleed  . Hepatic encephalopathy ([place]) 2011, 12/2013  . Myocardial infarction [facility]) ? 2012  . Shortness of breath dyspnea   . Arthritis   . Hx of echocardiogram     Echo 5/16:  EF 55-60%, no RWMA, mod LAE  . Thrombocytopenia ([place])    . Cholelithiasis   . Rectal varices   . PFO (patent foramen ovale): Per TEE 04/20/2015 04/20/2015  . Bacteremia   . Portal hypertension ([place])   . Rectal varices     Current Outpatient Prescriptions  Medication Sig Dispense Refill  . aspirin 81 MG tablet Take 81 mg by mouth daily.     Marland Kitchen atorvastatin (LIPITOR) 10 MG tablet Take 1 tablet (10 mg total) by mouth daily. 90 tablet 1  . furosemide (LASIX) 40 MG tablet Take 1 tablet (40 mg total) by mouth daily. 30 tablet 3  . insulin regular (NOVOLIN R,HUMULIN R) 100 units/mL injection According to sliding scale: 151-200: 2 units, 201-250 4 units, 251-300 6 units, 301-350 8 units. If > 400 call me. 10 mL 11  . lactulose (CHRONULAC) 10 GM/15ML solution TAKE 45 ML BY MOUTH 3 TIMES A DAY ON SCHEDULE - IF YOU HAVE NOT HAD A BOWEL MOVEMENT BY 1PM ON ANY GIVEN DAY, TAKE AN ADDITIONAL 45ML DOSE TO ASSURE YOU HAVE A BOWEL MOVEMENT AND CONTINUE YOUR SCHEDULED 3X A DAY SCHEDULE ON TOP OF THAT 4800 mL 2  . metFORMIN (GLUCOPHAGE) 1000 MG tablet Take 1,000 mg by mouth 2 (two) times daily.  11  . Multiple Vitamin (  MULTIVITAMIN) capsule Take 1 capsule by mouth daily.      . nadolol (CORGARD) 40 MG tablet TAKE 1 TABLET BY MOUTH DAILY 30 tablet 0  . nitroGLYCERIN (NITROSTAT) 0.4 MG SL tablet Place 1 tablet (0.4 mg total) under the tongue every 5 (five) minutes as needed for chest pain (chest pain). 25 tablet 5  . omeprazole (PRILOSEC) 20 MG capsule TAKE 1 CAPSULE (20 MG TOTAL) BY MOUTH DAILY. 30 capsule 8  . ranolazine (RANEXA) 500 MG 12 hr tablet Take 1 tablet (500 mg total) by mouth 2 (two) times daily. 60 tablet 2  . ranolazine (RANEXA) 500 MG 12 hr tablet Take 1 tablet (500 mg total) by mouth 2 (two) times daily. Please call and schedule a six month follow up appointment. 60 tablet 5  . rifaximin (XIFAXAN) 550 MG TABS tablet Take 1 tablet (550 mg total) by mouth 2 (two) times daily. 60 tablet 2  . spironolactone (ALDACTONE) 25 MG tablet Take 0.5 tablets (12.5 mg  total) by mouth daily. 30 tablet 2   No current facility-administered medications for this visit.    Allergies  Allergen Reactions  . Isosorbide Nitrate Other (See Comments)    Nose bleeds    Family History  Problem Relation Age of Onset  . Heart attack Brother 33    MI  . Diabetes Brother   . Heart attack Father   . Diabetes Father   . COPD Father   . COPD Mother   . Stroke Neg Hx   . Heart attack Brother     Social History   Social History  . Marital Status: Married    Spouse Name: N/A  . Number of Children: 3  . Years of Education: N/A   Occupational History  . Unemployed Other    Worked in maintenance prior   Social History Main Topics  . Smoking status: Current Every Day Smoker -- 1.00 packs/day for 36 years    Types: E-cigarettes  . Smokeless tobacco: Never Used     Comment: uses vapor cigarettes (2016 )  . Alcohol Use: No  . Drug Use: No  . Sexual Activity: No   Other Topics Concern  . Not on file   Social History Narrative   Married   Gets regular exercise: walking     Skin: Admits to nail changes on left foot. HEENT:  Admits to blurred vision in left eye.  Denies eye pain or discharge.  No other specific complaints in a complete review of systems (except as listed in HPI above).      Objective:   Physical Exam BP 116/64 mmHg  Pulse 59  Temp(Src) 98.3 F (36.8 C) (Oral)  Wt 278 lb (126.1 kg)  SpO2 99%  General:  Chronically ill appearing, obese in no acute distress. Skin: Darkened color of nails of great toe and second toe on left foot.  Possible crack in nail of great toe.   HEENT:  No scleral icterus or conjunctival injection present.  EOMs intact.  No cloudiness of lens present.        Assessment & Plan:   Fungal infection of toenails, left foot:  Referral to podiatrist  Blurred vision, left eye, patient reports history of cataract:  Referral to ophthalmologist   RTC as needed

## 2016-01-26 NOTE — Progress Notes (Signed)
Pre visit review using our clinic review tool, if applicable. No additional management support is needed unless otherwise documented below in the visit note. 

## 2016-01-26 NOTE — Patient Instructions (Signed)
Nail Ringworm A fungal infection of the nail (tinea unguium/onychomycosis) is common. It is common as the visible part of the nail is composed of dead cells which have no blood supply to help prevent infection. It occurs because fungi are everywhere and will pick any opportunity to grow on any dead material. Because nails are very slow growing they require up to 2 years of treatment with anti-fungal medications. The entire nail back to the base is infected. This includes approximately  of the nail which you cannot see. If your caregiver has prescribed a medication by mouth, take it every day and as directed. No progress will be seen for at least 6 to 9 months. Do not be disappointed! Because fungi live on dead cells with little or no exposure to blood supply, medication delivery to the infection is slow; thus the cure is slow. It is also why you can observe no progress in the first 6 months. The nail becoming cured is the base of the nail, as it has the blood supply. Topical medication such as creams and ointments are usually not effective. Important in successful treatment of nail fungus is closely following the medication regimen that your doctor prescribes. Sometimes you and your caregiver may elect to speed up this process by surgical removal of all the nails. Even this may still require 6 to 9 months of additional oral medications. See your caregiver as directed. Remember there will be no visible improvement for at least 6 months. See your caregiver sooner if other signs of infection (redness and swelling) develop.   This information is not intended to replace advice given to you by your health care provider. Make sure you discuss any questions you have with your health care provider.   Document Released: 06/24/2000 Document Revised: 11/11/2014 Document Reviewed: 12/29/2014 Elsevier Interactive Patient Education [employer].

## 2016-02-03 ENCOUNTER — Other Ambulatory Visit: Payer: Self-pay | Admitting: Internal Medicine

## 2016-02-03 NOTE — Telephone Encounter (Signed)
Please advise if pt is supposed to continue

## 2016-02-03 NOTE — Telephone Encounter (Signed)
Yes, should still be taking, sent electronically

## 2016-02-07 ENCOUNTER — Other Ambulatory Visit: Payer: Self-pay | Admitting: Internal Medicine

## 2016-02-09 ENCOUNTER — Other Ambulatory Visit: Payer: Self-pay | Admitting: *Deleted

## 2016-02-09 ENCOUNTER — Encounter: Payer: Self-pay | Admitting: *Deleted

## 2016-02-09 VITALS — BP 106/52 | HR 66 | Resp 18 | Wt 272.1 lb

## 2016-02-09 DIAGNOSIS — I5032 Chronic diastolic (congestive) heart failure: Secondary | ICD-10-CM

## 2016-02-09 NOTE — Patient Outreach (Signed)
[facility]) Care Management  [facility] Discharge 02/09/2016  ROBBIE RIDEAUX 05-Feb-1956 [id number]  DOSSIE SWOR is an 60 y.o. male male followed by [place] after recent IP hospital visit June 7-9, 2017 for confusion, hepatic encephalopathy, CHF, and questionable diet/ medication compliance.   Today, Mr. Thune continues to report that he is feeling "much better," stating that his "only problem is that my insurance has stopped covering my medications."  Mr. Kise states that he isn't sure that he will be able to re-fill any of his medications, reporting that he went to [facility] yesterday to get Nadolol (C[place]ard) and Ranolazine (Ranexa) filled and "the cost has gone up so much, I couldn't pay for it."  Mr. Boughner states that the pharmacist told him that he "was in a hole with his insurance coverage," which he did not understand.   Mr. Rubenstein reports that he has been taking his medications as they are prescribed with plans to continue doing so, "until I run out of them and can't afford them anymore."  Patient again verbalized a good understanding of the purpose, dosing, and schedule of his regularly prescribed medications.  Mr. Rogan stated that he plans to contact his medical providers to tell them that he is not able to afford his medications and will therefore not be able to take his medications.  We discussed [facility] pharmacy resource to help determine options available to help Mr. Carton afford his medications and he agreed to my placing that referral.   Mr. Rames reports that he has attended all scheduled provider appointments and reported that he went to see his PCP "yesterday," where a referral to the podiatrist and eye doctor (for possible cataract surgery) was made.   Mr. Sorg followed up with his insurance company about possibility of having the cataract surgery he needs, and stated that he obtained the  information he needed to move forward on having the surgery  Mr. Homewood has been continued monitoring and recording his daily weights, which we reviewed together today.  Mr. Frieden weight has been in a general range 272- 274.5 pounds, with a weight this morning of 272 lbs.  During our time together today, we again discussed general CHF zones and corresponding action plans, as well as weight gain guidelines, to which Mr. Wachter voices a good understanding.  Mr. Farney has continued monitoring his blood sugars every morning and reports a blood glucose this morning of 130.    Mr. Reuter denies further concerns, questions, needs, or problems, and agrees that he is ready for [place] discharge, as he has successfully met his [place] CM goals.  Patient voiced that he would like to have ongoing reinforcement of self-health management of chronic disease state of CHF/ DM with [facility] CM RN Telephonic Health Coach.  Subjective: "I am doing fine, except that I am really mad about what my insurance company has done making my medicines so expensive all of a sudden.  I am going to have to stop taking the medicines; I can't afford them."  Objective:    BP (!) 106/52   Pulse 66   Resp 18   Wt 272 lb 1.6 oz (123.4 kg)   SpO2 98%   BMI 43.92 kg/m    Review of Systems  Constitutional: Negative.  Negative for fever, malaise/fatigue and weight loss.  Respiratory: Negative for cough, sputum production, shortness of breath and wheezing.   Cardiovascular: Negative.  Negative for chest pain and leg swelling.  Gastrointestinal: Negative.  Negative for abdominal pain, nausea and vomiting.  Genitourinary: Negative.   Musculoskeletal: Negative for back pain, falls and joint pain.  Skin: Negative.   Neurological: Negative.  Negative for dizziness and weakness.  Psychiatric/Behavioral: Negative.  Negative for depression. The patient is not nervous/anxious.     Physical Exam   Constitutional: He is oriented to person, place, and time. He appears well-developed and well-nourished.  Cardiovascular: Normal rate, regular rhythm, normal heart sounds and intact distal pulses.   Respiratory: Effort normal and breath sounds normal. No respiratory distress. He has no wheezes. He has no rales.  GI: Soft. Bowel sounds are normal.  Musculoskeletal: He exhibits no edema.  Neurological: He is alert and oriented to person, place, and time.  Skin: Skin is warm and dry.  Psychiatric: He has a normal mood and affect. His behavior is normal. Judgment and thought content normal.    Encounter Medications:   Outpatient Encounter Prescriptions as of 02/09/2016  Medication Sig Note  . aspirin 81 MG tablet Take 81 mg by mouth daily.    Marland Kitchen atorvastatin (LIPITOR) 10 MG tablet Take 1 tablet (10 mg total) by mouth daily.   . furosemide (LASIX) 40 MG tablet TAKE 1 TABLET EVERY MORNING   . insulin regular (NOVOLIN R,HUMULIN R) 100 units/mL injection According to sliding scale: 151-200: 2 units, 201-250 4 units, 251-300 6 units, 301-350 8 units. If > 400 call me.   . lactulose (CHRONULAC) 10 GM/15ML solution TAKE 45 ML BY MOUTH 3 TIMES A DAY ON SCHEDULE - IF YOU HAVE NOT HAD A BOWEL MOVEMENT BY 1PM ON ANY GIVEN DAY, TAKE AN ADDITIONAL 45ML DOSE TO ASSURE YOU HAVE A BOWEL MOVEMENT AND CONTINUE YOUR SCHEDULED 3X A DAY SCHEDULE ON TOP OF THAT   . metFORMIN (GLUCOPHAGE) 1000 MG tablet Take 1,000 mg by mouth 2 (two) times daily.   . Multiple Vitamin (MULTIVITAMIN) capsule Take 1 capsule by mouth daily.     . nitroGLYCERIN (NITROSTAT) 0.4 MG SL tablet Place 1 tablet (0.4 mg total) under the tongue every 5 (five) minutes as needed for chest pain (chest pain).   Marland Kitchen omeprazole (PRILOSEC) 20 MG capsule TAKE 1 CAPSULE (20 MG TOTAL) BY MOUTH DAILY.   . rifaximin (XIFAXAN) 550 MG TABS tablet Take 1 tablet (550 mg total) by mouth 2 (two) times daily. 02/09/2016: Patient states could not refill due to increase cost  of this medication  . spironolactone (ALDACTONE) 25 MG tablet Take 0.5 tablets (12.5 mg total) by mouth daily.   . nadolol (CORGARD) 40 MG tablet TAKE 1 TABLET BY MOUTH EVERY DAY (Patient not taking: Reported on 02/09/2016) 02/09/2016: Did not refill secondary to cost of medication  . ranolazine (RANEXA) 500 MG 12 hr tablet Take 1 tablet (500 mg total) by mouth 2 (two) times daily. (Patient not taking: Reported on 02/09/2016) 02/09/2016: Patient states could not refill due to increase cost of this medication  . ranolazine (RANEXA) 500 MG 12 hr tablet Take 1 tablet (500 mg total) by mouth 2 (two) times daily. Please call and schedule a six month follow up appointment. (Patient not taking: Reported on 02/09/2016)    No facility-administered encounter medications on file as of 02/09/2016.     Functional Status:   In your present state of health, do you have any difficulty performing the following activities: 01/11/2016 12/16/2015  Hearing? N N  Vision? Y N  Difficulty concentrating or making decisions? N  N  Walking or climbing stairs? N N  Dressing or bathing? N N  Doing errands, shopping? N N  Preparing Food and eating ? N -  Using the Toilet? N -  In the past six months, have you accidently leaked urine? N -  Do you have problems with loss of bowel control? N -  Managing your Medications? N -  Managing your Finances? N -  Housekeeping or managing your Housekeeping? N -  Some recent data might be hidden    Fall/Depression Screening:    PHQ 2/9 Scores 01/11/2016 12/15/2015 10/22/2015 09/01/2015 07/22/2015 04/23/2015  PHQ - 2 Score 0 0 0 0 0 4  PHQ- 9 Score - - - - - 8    Assessment:  Mr. Antrim continues to feel better after his recent hospitalization and is taking his medications as prescribed and attending all scheduled provider appointments.  Mr. Wynter has concerns about the costs of his medications and would like more information on how these costs might be reduced.  Mr. Fogel agrees that he is  ready for discharge from [place] program, as he has successfully met his goals, but would like to have ongoing reinforcement of self-health management of chronic disease state of CHF with follow up from [place].  Plan:   Mr. Sandler will take his medications as they are prescribed and will attend all scheduled provider appointments.   Mr. Hershman will discuss his concerns with the cost of his medications with [facility] pharmacy staff; referral placed today.  Mr. Mano notify his providers for any concerns, questions, needs, or problems that arise.  [place] discharge today, with referral to [place] for ongoing reinforcement of self- health management of chronic disease state of CHF.  [name], RN, BSN, [employer] [facility] Care Management  [phone]

## 2016-02-10 ENCOUNTER — Encounter: Payer: Self-pay | Admitting: *Deleted

## 2016-02-11 DIAGNOSIS — M79675 Pain in left toe(s): Secondary | ICD-10-CM | POA: Diagnosis not present

## 2016-02-11 DIAGNOSIS — M79674 Pain in right toe(s): Secondary | ICD-10-CM | POA: Diagnosis not present

## 2016-02-11 DIAGNOSIS — B351 Tinea unguium: Secondary | ICD-10-CM | POA: Diagnosis not present

## 2016-02-11 DIAGNOSIS — E119 Type 2 diabetes mellitus without complications: Secondary | ICD-10-CM | POA: Diagnosis not present

## 2016-02-15 ENCOUNTER — Telehealth: Payer: Self-pay | Admitting: Cardiology

## 2016-02-15 NOTE — Telephone Encounter (Signed)
Pt calling stating that he is in the donut hole trying to Ranexa and would some assistant. Please give pt a call back. Thanks

## 2016-02-18 NOTE — Telephone Encounter (Signed)
Spoke with patient today and gave him the phone # of the [employer]. I am also mailing him an application for the [facility].

## 2016-02-19 ENCOUNTER — Telehealth: Payer: Self-pay | Admitting: Internal Medicine

## 2016-02-19 NOTE — Telephone Encounter (Signed)
I have spoken to patient to advise that unfortunately, I am unable to help him with some of the costs of his medications. However, [employer] does have a patient assistance program for Xifaxan that he may qualify for. I have filled out the assistance form. Dr Hilarie Fredrickson will sign and then this will be mailed to the patient to do his part. He states that he will bring the completed form back and we can fax for him. Of note, it also appears [facility] is trying to help sort out the medication problem as well.

## 2016-02-20 ENCOUNTER — Other Ambulatory Visit: Payer: Self-pay | Admitting: Internal Medicine

## 2016-02-21 ENCOUNTER — Inpatient Hospital Stay (HOSPITAL_COMMUNITY)
Admission: EM | Admit: 2016-02-21 | Discharge: 2016-02-24 | DRG: 871 | Disposition: A | Discharge status: Home / Self Care | Payer: PPO | Attending: Internal Medicine | Admitting: Internal Medicine

## 2016-02-21 ENCOUNTER — Encounter (HOSPITAL_COMMUNITY): Payer: Self-pay | Admitting: *Deleted

## 2016-02-21 ENCOUNTER — Emergency Department (HOSPITAL_COMMUNITY): Payer: PPO

## 2016-02-21 DIAGNOSIS — K7469 Other cirrhosis of liver: Secondary | ICD-10-CM | POA: Diagnosis not present

## 2016-02-21 DIAGNOSIS — B961 Klebsiella pneumoniae [K. pneumoniae] as the cause of diseases classified elsewhere: Secondary | ICD-10-CM | POA: Diagnosis not present

## 2016-02-21 DIAGNOSIS — Z794 Long term (current) use of insulin: Secondary | ICD-10-CM

## 2016-02-21 DIAGNOSIS — A4189 Other specified sepsis: Principal | ICD-10-CM | POA: Diagnosis present

## 2016-02-21 DIAGNOSIS — R058 Other specified cough: Secondary | ICD-10-CM

## 2016-02-21 DIAGNOSIS — R410 Disorientation, unspecified: Secondary | ICD-10-CM | POA: Diagnosis not present

## 2016-02-21 DIAGNOSIS — R7989 Other specified abnormal findings of blood chemistry: Secondary | ICD-10-CM

## 2016-02-21 DIAGNOSIS — K7581 Nonalcoholic steatohepatitis (NASH): Secondary | ICD-10-CM | POA: Diagnosis not present

## 2016-02-21 DIAGNOSIS — I2510 Atherosclerotic heart disease of native coronary artery without angina pectoris: Secondary | ICD-10-CM | POA: Diagnosis not present

## 2016-02-21 DIAGNOSIS — F17290 Nicotine dependence, other tobacco product, uncomplicated: Secondary | ICD-10-CM | POA: Diagnosis present

## 2016-02-21 DIAGNOSIS — K766 Portal hypertension: Secondary | ICD-10-CM | POA: Diagnosis present

## 2016-02-21 DIAGNOSIS — A419 Sepsis, unspecified organism: Secondary | ICD-10-CM | POA: Diagnosis not present

## 2016-02-21 DIAGNOSIS — Z6841 Body Mass Index (BMI) 40.0 and over, adult: Secondary | ICD-10-CM | POA: Diagnosis not present

## 2016-02-21 DIAGNOSIS — R739 Hyperglycemia, unspecified: Secondary | ICD-10-CM | POA: Diagnosis not present

## 2016-02-21 DIAGNOSIS — Z955 Presence of coronary angioplasty implant and graft: Secondary | ICD-10-CM | POA: Diagnosis not present

## 2016-02-21 DIAGNOSIS — E119 Type 2 diabetes mellitus without complications: Secondary | ICD-10-CM | POA: Diagnosis not present

## 2016-02-21 DIAGNOSIS — E108 Type 1 diabetes mellitus with unspecified complications: Secondary | ICD-10-CM | POA: Diagnosis not present

## 2016-02-21 DIAGNOSIS — E785 Hyperlipidemia, unspecified: Secondary | ICD-10-CM | POA: Diagnosis present

## 2016-02-21 DIAGNOSIS — D696 Thrombocytopenia, unspecified: Secondary | ICD-10-CM | POA: Diagnosis present

## 2016-02-21 DIAGNOSIS — Q211 Atrial septal defect: Secondary | ICD-10-CM

## 2016-02-21 DIAGNOSIS — R7881 Bacteremia: Secondary | ICD-10-CM | POA: Diagnosis not present

## 2016-02-21 DIAGNOSIS — I252 Old myocardial infarction: Secondary | ICD-10-CM

## 2016-02-21 DIAGNOSIS — K7460 Unspecified cirrhosis of liver: Secondary | ICD-10-CM | POA: Diagnosis not present

## 2016-02-21 DIAGNOSIS — R509 Fever, unspecified: Secondary | ICD-10-CM | POA: Diagnosis not present

## 2016-02-21 DIAGNOSIS — R7309 Other abnormal glucose: Secondary | ICD-10-CM | POA: Diagnosis not present

## 2016-02-21 DIAGNOSIS — E1165 Type 2 diabetes mellitus with hyperglycemia: Secondary | ICD-10-CM | POA: Diagnosis not present

## 2016-02-21 DIAGNOSIS — R6520 Severe sepsis without septic shock: Secondary | ICD-10-CM | POA: Diagnosis present

## 2016-02-21 DIAGNOSIS — Z7982 Long term (current) use of aspirin: Secondary | ICD-10-CM | POA: Diagnosis not present

## 2016-02-21 DIAGNOSIS — R748 Abnormal levels of other serum enzymes: Secondary | ICD-10-CM | POA: Diagnosis not present

## 2016-02-21 DIAGNOSIS — K7682 Hepatic encephalopathy: Secondary | ICD-10-CM

## 2016-02-21 DIAGNOSIS — I81 Portal vein thrombosis: Secondary | ICD-10-CM | POA: Diagnosis not present

## 2016-02-21 DIAGNOSIS — I5032 Chronic diastolic (congestive) heart failure: Secondary | ICD-10-CM | POA: Diagnosis present

## 2016-02-21 DIAGNOSIS — K7290 Hepatic failure, unspecified without coma: Secondary | ICD-10-CM | POA: Diagnosis not present

## 2016-02-21 DIAGNOSIS — IMO0001 Reserved for inherently not codable concepts without codable children: Secondary | ICD-10-CM

## 2016-02-21 DIAGNOSIS — I110 Hypertensive heart disease with heart failure: Secondary | ICD-10-CM | POA: Diagnosis not present

## 2016-02-21 DIAGNOSIS — R05 Cough: Secondary | ICD-10-CM

## 2016-02-21 LAB — COMPREHENSIVE METABOLIC PANEL
ALK PHOS: 231 U/L — AB (ref 38–126)
ALT: 47 U/L (ref 17–63)
AST: 79 U/L — ABNORMAL HIGH (ref 15–41)
Albumin: 2.8 g/dL — ABNORMAL LOW (ref 3.5–5.0)
Anion gap: 9 (ref 5–15)
BILIRUBIN TOTAL: 2.1 mg/dL — AB (ref 0.3–1.2)
BUN: <5 mg/dL — ABNORMAL LOW (ref 6–20)
CALCIUM: 8.5 mg/dL — AB (ref 8.9–10.3)
CO2: 21 mmol/L — AB (ref 22–32)
CREATININE: 0.84 mg/dL (ref 0.61–1.24)
Chloride: 100 mmol/L — ABNORMAL LOW (ref 101–111)
GFR calc non Af Amer: >60 mL/min (ref >60)
Glucose, Bld: 343 mg/dL — ABNORMAL HIGH (ref 65–99)
Potassium: 3.7 mmol/L (ref 3.5–5.1)
SODIUM: 130 mmol/L — AB (ref 135–145)
Total Protein: 6.8 g/dL (ref 6.5–8.1)

## 2016-02-21 LAB — URINALYSIS, ROUTINE W REFLEX MICROSCOPIC
Bilirubin Urine: NEGATIVE
KETONES UR: 15 mg/dL — AB
Leukocytes, UA: NEGATIVE
Nitrite: NEGATIVE
PROTEIN: NEGATIVE mg/dL
Specific Gravity, Urine: 1.035 — ABNORMAL HIGH (ref 1.005–1.030)
pH: 6 (ref 5.0–8.0)

## 2016-02-21 LAB — CBC WITH DIFFERENTIAL/PLATELET
BASOS ABS: 0 10*3/uL (ref 0.0–0.1)
Basophils Relative: 0 %
EOS PCT: 0 %
Eosinophils Absolute: 0 10*3/uL (ref 0.0–0.7)
HCT: 35.5 % — ABNORMAL LOW (ref 39.0–52.0)
Hemoglobin: 11.9 g/dL — ABNORMAL LOW (ref 13.0–17.0)
LYMPHS ABS: 0.4 10*3/uL — AB (ref 0.7–4.0)
LYMPHS PCT: 5 %
MCH: 33.3 pg (ref 26.0–34.0)
MCHC: 33.5 g/dL (ref 30.0–36.0)
MCV: 99.4 fL (ref 78.0–100.0)
MONO ABS: 1.1 10*3/uL — AB (ref 0.1–1.0)
Monocytes Relative: 13 %
Neutro Abs: 6.8 10*3/uL (ref 1.7–7.7)
Neutrophils Relative %: 82 %
PLATELETS: 55 10*3/uL — AB (ref 150–400)
RBC: 3.57 MIL/uL — ABNORMAL LOW (ref 4.22–5.81)
RDW: 14.2 % (ref 11.5–15.5)
WBC: 8.4 10*3/uL (ref 4.0–10.5)

## 2016-02-21 LAB — I-STAT CG4 LACTIC ACID, ED: Lactic Acid, Venous: 2.63 mmol/L (ref 0.5–1.9)

## 2016-02-21 LAB — URINE MICROSCOPIC-ADD ON

## 2016-02-21 LAB — CBG MONITORING, ED
GLUCOSE-CAPILLARY: 278 mg/dL — AB (ref 65–99)
Glucose-Capillary: 333 mg/dL — ABNORMAL HIGH (ref 65–99)

## 2016-02-21 LAB — AMMONIA: Ammonia: 88 umol/L — ABNORMAL HIGH (ref 9–35)

## 2016-02-21 MED ORDER — SODIUM CHLORIDE 0.9 % IV BOLUS (SEPSIS)
500.0000 mL | Freq: Once | INTRAVENOUS | Status: AC
  Administered 2016-02-21: 500 mL via INTRAVENOUS

## 2016-02-21 MED ORDER — INSULIN ASPART 100 UNIT/ML ~~LOC~~ SOLN
0.0000 [IU] | Freq: Every day | SUBCUTANEOUS | Status: DC
  Administered 2016-02-21 – 2016-02-22 (×2): 3 [IU] via SUBCUTANEOUS
  Filled 2016-02-21: qty 1

## 2016-02-21 MED ORDER — VANCOMYCIN HCL IN DEXTROSE 1-5 GM/200ML-% IV SOLN: 1000.0000 mg | Freq: Once | INTRAVENOUS | Status: DC

## 2016-02-21 MED ORDER — IBUPROFEN 600 MG PO TABS
600.0000 mg | ORAL_TABLET | Freq: Four times a day (QID) | ORAL | Status: DC | PRN
  Administered 2016-02-22 – 2016-02-24 (×2): 600 mg via ORAL
  Filled 2016-02-21: qty 1
  Filled 2016-02-21: qty 3

## 2016-02-21 MED ORDER — ADULT MULTIVITAMIN W/MINERALS CH
1.0000 | ORAL_TABLET | Freq: Every day | ORAL | Status: DC
  Administered 2016-02-22 – 2016-02-24 (×3): 1 via ORAL
  Filled 2016-02-21 (×3): qty 1

## 2016-02-21 MED ORDER — ATORVASTATIN CALCIUM 10 MG PO TABS
10.0000 mg | ORAL_TABLET | Freq: Every day | ORAL | Status: DC
  Administered 2016-02-22 – 2016-02-24 (×3): 10 mg via ORAL
  Filled 2016-02-21 (×3): qty 1

## 2016-02-21 MED ORDER — VANCOMYCIN HCL 10 G IV SOLR
2000.0000 mg | INTRAVENOUS | Status: AC
  Administered 2016-02-21: 2000 mg via INTRAVENOUS
  Filled 2016-02-21: qty 2000

## 2016-02-21 MED ORDER — PANTOPRAZOLE SODIUM 40 MG PO TBEC
40.0000 mg | DELAYED_RELEASE_TABLET | Freq: Every day | ORAL | Status: DC
  Administered 2016-02-22 – 2016-02-24 (×3): 40 mg via ORAL
  Filled 2016-02-21 (×3): qty 1

## 2016-02-21 MED ORDER — RIFAXIMIN 550 MG PO TABS
550.0000 mg | ORAL_TABLET | Freq: Two times a day (BID) | ORAL | Status: DC
  Administered 2016-02-22 – 2016-02-24 (×6): 550 mg via ORAL
  Filled 2016-02-21 (×7): qty 1

## 2016-02-21 MED ORDER — ACETAMINOPHEN 500 MG PO TABS
1000.0000 mg | ORAL_TABLET | Freq: Once | ORAL | Status: AC
  Administered 2016-02-21: 1000 mg via ORAL
  Filled 2016-02-21: qty 2

## 2016-02-21 MED ORDER — SODIUM CHLORIDE 0.9 % IV SOLN
INTRAVENOUS | Status: AC
  Administered 2016-02-21: 23:00:00 via INTRAVENOUS

## 2016-02-21 MED ORDER — LACTULOSE 10 GM/15ML PO SOLN
30.0000 g | Freq: Three times a day (TID) | ORAL | Status: DC
  Administered 2016-02-22 – 2016-02-24 (×6): 30 g via ORAL
  Filled 2016-02-21 (×8): qty 45

## 2016-02-21 MED ORDER — INSULIN ASPART 100 UNIT/ML ~~LOC~~ SOLN
0.0000 [IU] | Freq: Three times a day (TID) | SUBCUTANEOUS | Status: DC
  Administered 2016-02-22: 2 [IU] via SUBCUTANEOUS
  Administered 2016-02-22: 5 [IU] via SUBCUTANEOUS
  Administered 2016-02-22: 7 [IU] via SUBCUTANEOUS
  Administered 2016-02-23: 3 [IU] via SUBCUTANEOUS
  Administered 2016-02-23: 7 [IU] via SUBCUTANEOUS
  Administered 2016-02-23 – 2016-02-24 (×2): 3 [IU] via SUBCUTANEOUS
  Administered 2016-02-24: 5 [IU] via SUBCUTANEOUS
  Administered 2016-02-24: 3 [IU] via SUBCUTANEOUS

## 2016-02-21 MED ORDER — PIPERACILLIN-TAZOBACTAM 3.375 G IVPB 30 MIN
3.3750 g | Freq: Once | INTRAVENOUS | Status: AC
  Administered 2016-02-21: 3.375 g via INTRAVENOUS
  Filled 2016-02-21: qty 50

## 2016-02-21 MED ORDER — ASPIRIN 81 MG PO CHEW
81.0000 mg | CHEWABLE_TABLET | Freq: Every day | ORAL | Status: DC
  Administered 2016-02-22 – 2016-02-24 (×3): 81 mg via ORAL
  Filled 2016-02-21 (×3): qty 1

## 2016-02-21 MED ORDER — MULTIVITAMINS PO CAPS: 1.0000 | ORAL_CAPSULE | Freq: Every day | ORAL | Status: DC

## 2016-02-21 MED ORDER — SODIUM CHLORIDE 0.9 % IV BOLUS (SEPSIS)
1000.0000 mL | Freq: Once | INTRAVENOUS | Status: AC
  Administered 2016-02-21: 1000 mL via INTRAVENOUS

## 2016-02-21 NOTE — ED Notes (Signed)
Dr. Gardner at bedside 

## 2016-02-21 NOTE — ED Notes (Signed)
Dr.Steinl aware of VS

## 2016-02-21 NOTE — ED Provider Notes (Signed)
[facility] DEPT Provider Note   CSN: [id number] Arrival date & time: 02/21/16  2036  First Provider Contact:  First MD Initiated Contact with Patient 02/21/16 2105        History   Chief Complaint Chief Complaint  Patient presents with  . Hyperglycemia    HPI Adam Butler is a 60 y.o. male.  Patient w hx cirrhosis/NASH, with acute onset shaking chills, and fever this evening. t103 on arrival. Symptoms constant, persistent since onset.  Patient reports feeling fine earlier today, had gone to meet spouse at restaurant when they noted shaking chills. Patient/family unaware of source of fever.  Recent non prod cough, ?increased from baseline, spouse feels may be his normal smokers cough. No sore throat, runny nose or other uri c/o. No headaches. No neck pain or stiffness. No abd pain. Normal appetite. No vomiting or diarrhea. No dysuria or gu c/o. No skin rash/lesions. No area of pain or swelling. Compliant w normal meds.       The history is provided by the patient, the spouse and the EMS personnel.  Hyperglycemia  Associated symptoms: fever   Associated symptoms: no abdominal pain, no chest pain, no confusion, no dysuria, no shortness of breath and no vomiting     Past Medical History:  Diagnosis Date  . Arthritis   . Bacteremia   . Cholelithiasis   . Cirrhosis ([place]) 2011   Cryptogenic, Likely NASH. Family/pt deny EtOH. HCV, HBV, HAV negative. ANA negative. AMA positive. Ascites 12/11  . Coronary artery disease    Inferior MI 12/11; LHC with occluded mid CFX and 80% proximal RCA. EF 55%. He had 3.0 x 28 vision BMS to CFX  . Diastolic CHF, acute ([place])    Echo 12/11 with ef 50-55% and mild LVH. EF 55% by LV0gram in 12/11  . Esophageal varices ([place]) 2011, 2013   no hx acute variceal bleed  . GI bleed    12/11: Etiology not clearly defined. EGD: nonbleeding esophageal varices.  . Hepatic encephalopathy ([place]) 2011, 12/2013  . Hx of echocardiogram    Echo 5/16:  EF  55-60%, no RWMA, mod LAE  . Hyperlipemia   . Hypertension   . Myocardial infarction [facility]) ? 2012  . PFO (patent foramen ovale): Per TEE 04/20/2015 04/20/2015  . Portal hypertension ([place])   . Rectal varices   . Rectal varices   . S/P coronary artery stent placement 06/2010  . Shortness of breath dyspnea   . SVT (supraventricular tachycardia) ([place])    1/12: appeared to be an ectopic atrial tachycardia. Required DCCV with hemodynamic instability  . Thrombocytopenia ([place])   . Type II diabetes mellitus ([place]) 2011    Patient Active Problem List   Diagnosis Date Noted  . Hepatic encephalopathy ([place]) 12/16/2015  . Diabetes mellitus with complication ([place])   . GERD (gastroesophageal reflux disease) 09/29/2015  . Type 2 diabetes mellitus ([place]) 08/18/2015  . CAD in native artery   . Encephalopathy, hepatic ([place])   . Liver cirrhosis secondary to NASH 06/06/2015  . Tobacco abuse 05/04/2015  . PFO (patent foramen ovale): Per TEE 04/20/2015 04/20/2015  . Esophageal varices ([place]) 08/16/2010  . HLD (hyperlipidemia) 08/02/2010  . DIASTOLIC HEART FAILURE, CHRONIC 08/02/2010    Past Surgical History:  Procedure Laterality Date  . APPENDECTOMY    . CARDIAC CATHETERIZATION  07/01/2010   BMS to CFX.  [facility] COLONOSCOPY  04/13/2012   Procedure: COLONOSCOPY;  Surgeon: Inda Castle, MD;  Location: [facility] ENDOSCOPY;  Service:  Endoscopy;  Laterality: N/A;  . CORONARY STENT PLACEMENT  06/30/2010   CFX   Distal        . ESOPHAGEAL BANDING N/A 01/01/2014   Procedure: ESOPHAGEAL BANDING;  Surgeon: Jerene Bears, MD;  Location: [facility] ENDOSCOPY;  Service: Endoscopy;  Laterality: N/A;  . ESOPHAGOGASTRODUODENOSCOPY  04/13/2012   Procedure: ESOPHAGOGASTRODUODENOSCOPY (EGD);  Surgeon: Inda Castle, MD;  Location: [facility];  Service: Endoscopy;  Laterality: N/A;  . ESOPHAGOGASTRODUODENOSCOPY N/A 01/01/2014   Procedure: ESOPHAGOGASTRODUODENOSCOPY (EGD);  Surgeon: Jerene Bears, MD;  Location: [facility] ENDOSCOPY;  Service:  Endoscopy;  Laterality: N/A;  . LEFT HEART CATHETERIZATION WITH CORONARY ANGIOGRAM N/A 10/15/2014   Procedure: LEFT HEART CATHETERIZATION WITH CORONARY ANGIOGRAM;  Surgeon: Peter M Martinique, MD;  Location: [facility] C[facility] LAB;  Service: Cardiovascular;  Laterality: N/A;  . orif r leg    . TEE WITHOUT CARDIOVERSION N/A 04/20/2015   Procedure: TRANSESOPHAGEAL ECHOCARDIOGRAM (TEE);  Surgeon: Fay Records, MD;  Location[facility] END[facility]COPY;  Service: Cardiovascular;  Laterality: N/A;       Home Medications    Prior to Admission medications   Medication Sig Start Date End Date Taking? Authorizing Provider  aspirin 81 MG tablet Take 81 mg by mouth daily.    Yes Historical Provider, MD  atorvastatin (LIPITOR) 10 MG tablet Take 1 tablet (10 mg total) by mouth daily. 09/16/15  Yes [name], NP  furosemide (LASIX) 40 MG tablet TAKE 1 TABLET EVERY MORNING 02/03/16  Yes [name], NP  insulin regular (NOVOLIN R,HUMULIN R) 100 units/mL injection According to sliding scale: 151-200: 2 units, 201-250 4 units, 251-300 6 units, 301-350 8 units. If > 400 call me. 07/09/15  Yes [name], NP  lactulose (CHRONULAC) 10 GM/15ML solution TAKE 45 ML BY MOUTH 3 TIMES A DAY ON SCHEDULE - IF YOU HAVE NOT HAD A BOWEL MOVEMENT BY 1PM ON ANY GIVEN DAY, TAKE AN ADDITIONAL 45ML DOSE TO ASSURE YOU HAVE A BOWEL MOVEMENT AND CONTINUE YOUR SCHEDULED 3X A DAY SCHEDULE ON TOP OF THAT 09/28/15  Yes Jerene Bears, MD  metFORMIN (GLUCOPHAGE) 1000 MG tablet Take 1,000 mg by mouth 2 (two) times daily. 11/27/14  Yes Historical Provider, MD  Multiple Vitamin (MULTIVITAMIN) capsule Take 1 capsule by mouth daily.     Yes Historical Provider, MD  nitroGLYCERIN (NITROSTAT) 0.4 MG SL tablet Place 1 tablet (0.4 mg total) under the tongue every 5 (five) minutes as needed for chest pain (chest pain). 11/09/15  Yes Larey Dresser, MD  omeprazole (PRILOSEC) 20 MG capsule TAKE 1 CAPSULE (20 MG TOTAL) BY MOUTH DAILY. 09/03/15  Yes Larey Dresser, MD    rifaximin (XIFAXAN) 550 MG TABS tablet Take 1 tablet (550 mg total) by mouth 2 (two) times daily. 09/28/15  Yes Jerene Bears, MD  spironolactone (ALDACTONE) 25 MG tablet Take 0.5 tablets (12.5 mg total) by mouth daily. 12/01/15  Yes Jerene Bears, MD  nadolol (CORGARD) 40 MG tablet TAKE 1 TABLET BY MOUTH EVERY DAY Patient not taking: Reported on 02/09/2016 02/07/16   Jerene Bears, MD  ranolazine (RANEXA) 500 MG 12 hr tablet Take 1 tablet (500 mg total) by mouth 2 (two) times daily. Patient not taking: Reported on 02/09/2016 07/09/15   [name], NP  ranolazine (RANEXA) 500 MG 12 hr tablet Take 1 tablet (500 mg total) by mouth 2 (two) times daily. Please call and schedule a six month follow up appointment. Patient not taking: Reported on 02/09/2016  12/29/15   Liliane Shi, PA-C    Family History Family History  Problem Relation Age of Onset  . Heart attack Brother 58    MI  . Diabetes Brother   . Heart attack Father   . Diabetes Father   . COPD Father   . COPD Mother   . Heart attack Brother   . Stroke Neg Hx     Social History Social History  Substance Use Topics  . Smoking status: Current Every Day Smoker    Packs/day: 1.00    Years: 36.00    Types: E-cigarettes  . Smokeless tobacco: Never Used     Comment: uses vapor cigarettes (2016 )  . Alcohol use No     Allergies   Isosorbide nitrate   Review of Systems Review of Systems  Constitutional: Positive for chills and fever.  HENT: Negative for rhinorrhea and sore throat.   Eyes: Negative for pain and redness.  Respiratory: Positive for cough. Negative for shortness of breath.   Cardiovascular: Negative for chest pain and leg swelling.  Gastrointestinal: Negative for abdominal pain, diarrhea and vomiting.  Genitourinary: Negative for dysuria and flank pain.  Musculoskeletal: Negative for back pain, neck pain and neck stiffness.  Skin: Negative for rash.  Neurological: Negative for headaches.  Hematological: Does not  bruise/bleed easily.  Psychiatric/Behavioral: Negative for confusion.     Physical Exam Updated Vital Signs BP 130/80   Pulse (!) 121   Temp (!) 103.2 F (39.6 C) (Oral)   Resp 26   Ht 5' 7"  (1.702 m)   Wt 122.5 kg   SpO2 96%   BMI 42.29 kg/m   Physical Exam  Constitutional: He is oriented to person, place, and time. He appears well-developed and well-nourished.  Febrile.   HENT:  Head: Atraumatic.  Nose: Nose normal.  Mouth/Throat: Oropharynx is clear and moist.  Eyes: Pupils are equal, round, and reactive to light.  Neck: Normal range of motion. Neck supple. No tracheal deviation present.  No stiffness or rigidity. Patient moving neck freely and comfortably in all direction.   Cardiovascular: Normal rate, regular rhythm, normal heart sounds and intact distal pulses.  Exam reveals no gallop and no friction rub.   No murmur heard. Pulmonary/Chest: Effort normal and breath sounds normal. No accessory muscle usage. No respiratory distress.  Abdominal: Soft. Bowel sounds are normal. He exhibits no distension. There is no tenderness.  Genitourinary:  Genitourinary Comments: Normal external gu exam. No cva tenderness.   Musculoskeletal: Normal range of motion. He exhibits no edema or tenderness.  Neurological: He is alert and oriented to person, place, and time.  Motor intact bil. str 5/5.   Skin: Skin is warm and dry. No rash noted.  Psychiatric: He has a normal mood and affect.  Nursing note and vitals reviewed.    ED Treatments / Results  Labs (all labs ordered are listed, but only abnormal results are displayed) Labs Reviewed  COMPREHENSIVE METABOLIC PANEL - Abnormal; Notable for the following:       Result Value   Sodium 130 (*)    Chloride 100 (*)    CO2 21 (*)    Glucose, Bld 343 (*)    BUN <5 (*)    Calcium 8.5 (*)    Albumin 2.8 (*)    AST 79 (*)    Alkaline Phosphatase 231 (*)    Total Bilirubin 2.1 (*)    All other components within normal limits  CBC  WITH DIFFERENTIAL/PLATELET -  Abnormal; Notable for the following:    RBC 3.57 (*)    Hemoglobin 11.9 (*)    HCT 35.5 (*)    Platelets 55 (*)    Lymphs Abs 0.4 (*)    Monocytes Absolute 1.1 (*)    All other components within normal limits  URINALYSIS, ROUTINE W REFLEX MICROSCOPIC (NOT AT [facility]) - Abnormal; Notable for the following:    Specific Gravity, Urine 1.035 (*)    Glucose, UA >1000 (*)    Hgb urine dipstick MODERATE (*)    Ketones, ur 15 (*)    All other components within normal limits  AMMONIA - Abnormal; Notable for the following:    Ammonia 88 (*)    All other components within normal limits  URINE MICROSCOPIC-ADD ON - Abnormal; Notable for the following:    Squamous Epithelial / LPF 0-5 (*)    Bacteria, UA RARE (*)    All other components within normal limits  I-STAT CG4 LACTIC ACID, ED - Abnormal; Notable for the following:    Lactic Acid, Venous 2.63 (*)    All other components within normal limits  CBG MONITORING, ED - Abnormal; Notable for the following:    Glucose-Capillary 333 (*)    All other components within normal limits  CULTURE, BLOOD (ROUTINE X 2)  CULTURE, BLOOD (ROUTINE X 2)  URINE CULTURE    EKG  EKG Interpretation None       Radiology Dg Chest Port 1 View  Result Date: 02/21/2016 CLINICAL DATA:  Acute onset of altered mental status. Confusion. Hyperglycemia. EXAM: PORTABLE CHEST 1 VIEW COMPARISON:  Chest radiograph performed 12/17/2015 FINDINGS: The lungs are hypoexpanded. Vascular congestion is noted. Increased interstitial markings may reflect mild interstitial edema. No pleural effusion or pneumothorax is seen. The cardiomediastinal silhouette is borderline normal in size. No acute osseous abnormalities are identified. There is mild chronic deformity of the left mid clavicle. IMPRESSION: Lungs hypoexpanded. Vascular congestion noted. Increased interstitial markings may reflect mild interstitial edema. Electronically Signed   By: Garald Balding  M.D.   On: 02/21/2016 21:32    Procedures Procedures (including critical care time)  Medications Ordered in ED Medications  sodium chloride 0.9 % bolus 1,000 mL (not administered)  piperacillin-tazobactam (ZOSYN) IVPB 3.375 g (not administered)  vancomycin (VANCOCIN) 2,000 mg in sodium chloride 0.9 % 500 mL IVPB (not administered)  acetaminophen (TYLENOL) tablet 1,000 mg (not administered)  sodium chloride 0.9 % bolus 500 mL (500 mLs Intravenous New Bag/Given 02/21/16 2109)     Initial Impression / Assessment and Plan / ED Course  I have reviewed the triage vital signs and the nursing notes.  Pertinent labs & imaging results that were available during my care of the patient were reviewed by me and considered in my medical decision making (see chart for details).  Clinical Course   Iv ns bolus. Labs sent. Cultures sent. Portable cxr.   Continuous pulse ox and monitor.  Recheck patient, no change or new symptoms.  Lactate elevation.  Ns bolus.   Iv abx given.   Hospitalists consulted for admission to stepdown bed.     Final Clinical Impressions(s) / ED Diagnoses   Final diagnoses:  None    New Prescriptions New Prescriptions   No medications on file     Lajean Saver, MD 02/21/16 2211

## 2016-02-21 NOTE — ED Triage Notes (Signed)
Pt to ED by EMS c/o hyperglycemia. Pt was going to dinner with wife and became altered. Wife drove pt to the fire dept, cbg checked and was 440. Daughter gave 12units of novolog, CBG decreased to 387. Pt restless, attempting to get out of bed, appears confused, is A&Ox3. C/o low back pain, pt warm to the touch. Hx of high ammonia levels in the past.

## 2016-02-21 NOTE — H&P (Signed)
History and Physical    Adam Butler OMA:[id number] DOB: 01-22-1956 DOA: 02/21/2016   PCP: [name], NP Chief Complaint:  Chief Complaint  Patient presents with  . Hyperglycemia    HPI: Adam Butler is a 60 y.o. male with medical history significant of NASH cirrhosis.  Patient presents to the ED with c/o acute onset of shaking chills, fever to 103 this evening.  Symptoms are constant and persistent since onset.  He was feeling fine earlier today, went to meet spouse at restaurant when they noticed shaking chills.  Unclear of source of fever.  He specifically denies: abd pain, headache, N/V/D, rashes or ulcers, SOB, cough worse than his baseline smokers cough, dysuria.  ED Course: On work up patient has lactate of 2.6, chronic thrombocytopenia, ammonia of 88.  CXR and UA are negative.  Review of Systems: As per HPI otherwise 10 point review of systems negative.    Past Medical History:  Diagnosis Date  . Arthritis   . Bacteremia   . Cholelithiasis   . Cirrhosis ([place]) 2011   Cryptogenic, Likely NASH. Family/pt deny EtOH. HCV, HBV, HAV negative. ANA negative. AMA positive. Ascites 12/11  . Coronary artery disease    Inferior MI 12/11; LHC with occluded mid CFX and 80% proximal RCA. EF 55%. He had 3.0 x 28 vision BMS to CFX  . Diastolic CHF, acute ([place])    Echo 12/11 with ef 50-55% and mild LVH. EF 55% by LV0gram in 12/11  . Esophageal varices ([place]) 2011, 2013   no hx acute variceal bleed  . GI bleed    12/11: Etiology not clearly defined. EGD: nonbleeding esophageal varices.  . Hepatic encephalopathy ([place]) 2011, 12/2013  . Hx of echocardiogram    Echo 5/16:  EF 55-60%, no RWMA, mod LAE  . Hyperlipemia   . Hypertension   . Myocardial infarction [facility]) ? 2012  . PFO (patent foramen ovale): Per TEE 04/20/2015 04/20/2015  . Portal hypertension ([place])   . Rectal varices   . Rectal varices   . S/P coronary artery stent placement 06/2010  . Shortness of breath dyspnea     . SVT (supraventricular tachycardia) ([place])    1/12: appeared to be an ectopic atrial tachycardia. Required DCCV with hemodynamic instability  . Thrombocytopenia ([place])   . Type II diabetes mellitus ([place]) 2011    Past Surgical History:  Procedure Laterality Date  . APPENDECTOMY    . CARDIAC CATHETERIZATION  07/01/2010   BMS to CFX.  Marland Kitchen COLONOSCOPY  04/13/2012   Procedure: COLONOSCOPY;  Surgeon: Inda Castle, MD;  Location: [facility] ENDOSCOPY;  Service: Endoscopy;  Laterality: N/A;  . CORONARY STENT PLACEMENT  06/30/2010   CFX   Distal        . ESOPHAGEAL BANDING N/A 01/01/2014   Procedure: ESOPHAGEAL BANDING;  Surgeon: Jerene Bears, MD;  Location: [place] ENDOSCOPY;  Service: Endoscopy;  Laterality: N/A;  . ESOPHAGOGASTRODUODENOSCOPY  04/13/2012   Procedure: ESOPHAGOGASTRODUODENOSCOPY (EGD);  Surgeon: Inda Castle, MD;  Location: [facility];  Service: Endoscopy;  Laterality: N/A;  . ESOPHAGOGASTRODUODENOSCOPY N/A 01/01/2014   Procedure: ESOPHAGOGASTRODUODENOSCOPY (EGD);  Surgeon: Jerene Bears, MD;  Location: [facility] ENDOSCOPY;  Service: Endoscopy;  Laterality: N/A;  . LEFT HEART CATHETERIZATION WITH CORONARY ANGIOGRAM N/A 10/15/2014   Procedure: LEFT HEART CATHETERIZATION WITH CORONARY ANGIOGRAM;  Surgeon: Peter M Martinique, MD;  Location: [facility] CATH LAB;  Service: Cardiovascular;  Laterality: N/A;  . orif r leg    . TEE WITHOUT CARDIOVERSION N/A  04/20/2015   Procedure: TRANSESOPHAGEAL ECHOCARDIOGRAM (TEE);  Surgeon: Fay Records, MD;  Location: [facility] ENDOSCOPY;  Service: Cardiovascular;  Laterality: N/A;     reports that he has been smoking E-cigarettes.  He has a 36.00 pack-year smoking history. He has never used smokeless tobacco. He reports that he does not drink alcohol or use drugs.  Allergies  Allergen Reactions  . Isosorbide Nitrate Other (See Comments)    Nose bleeds    Family History  Problem Relation Age of Onset  . Heart attack Brother 51    MI  . Diabetes Brother   . Heart attack Father    . Diabetes Father   . COPD Father   . COPD Mother   . Heart attack Brother   . Stroke Neg Hx       Prior to Admission medications   Medication Sig Start Date End Date Taking? Authorizing Provider  aspirin 81 MG tablet Take 81 mg by mouth daily.    Yes Historical Provider, MD  atorvastatin (LIPITOR) 10 MG tablet Take 1 tablet (10 mg total) by mouth daily. 09/16/15  Yes [name], NP  furosemide (LASIX) 40 MG tablet TAKE 1 TABLET EVERY MORNING 02/03/16  Yes [name], NP  insulin regular (NOVOLIN R,HUMULIN R) 100 units/mL injection According to sliding scale: 151-200: 2 units, 201-250 4 units, 251-300 6 units, 301-350 8 units. If > 400 call me. 07/09/15  Yes [name], NP  lactulose (CHRONULAC) 10 GM/15ML solution TAKE 45 ML BY MOUTH 3 TIMES A DAY ON SCHEDULE - IF YOU HAVE NOT HAD A BOWEL MOVEMENT BY 1PM ON ANY GIVEN DAY, TAKE AN ADDITIONAL 45ML DOSE TO ASSURE YOU HAVE A BOWEL MOVEMENT AND CONTINUE YOUR SCHEDULED 3X A DAY SCHEDULE ON TOP OF THAT 09/28/15  Yes Jerene Bears, MD  metFORMIN (GLUCOPHAGE) 1000 MG tablet Take 1,000 mg by mouth 2 (two) times daily. 11/27/14  Yes Historical Provider, MD  Multiple Vitamin (MULTIVITAMIN) capsule Take 1 capsule by mouth daily.     Yes Historical Provider, MD  nitroGLYCERIN (NITROSTAT) 0.4 MG SL tablet Place 1 tablet (0.4 mg total) under the tongue every 5 (five) minutes as needed for chest pain (chest pain). 11/09/15  Yes Larey Dresser, MD  omeprazole (PRILOSEC) 20 MG capsule TAKE 1 CAPSULE (20 MG TOTAL) BY MOUTH DAILY. 09/03/15  Yes Larey Dresser, MD  rifaximin (XIFAXAN) 550 MG TABS tablet Take 1 tablet (550 mg total) by mouth 2 (two) times daily. 09/28/15  Yes Jerene Bears, MD  spironolactone (ALDACTONE) 25 MG tablet Take 0.5 tablets (12.5 mg total) by mouth daily. 12/01/15  Yes Jerene Bears, MD    Physical Exam: Vitals:   02/21/16 2115 02/21/16 2130 02/21/16 2145 02/21/16 2200  BP:  131/67 (!) 111/44 103/60  Pulse: 118 113 111 105  Resp:  26 22 (!) 27 25  Temp:      TempSrc:      SpO2: 96% 97% 95% 94%  Weight:      Height:          Constitutional: NAD, calm, comfortable Eyes: PERRL, lids and conjunctivae normal ENMT: Mucous membranes are moist. Posterior pharynx clear of any exudate or lesions.Normal dentition.  Neck: normal, supple, no masses, no thyromegaly Respiratory: clear to auscultation bilaterally, no wheezing, no crackles. Normal respiratory effort. No accessory muscle use.  Cardiovascular: Regular rate and rhythm, no murmurs / rubs / gallops. No extremity edema. 2+ pedal pulses. No carotid bruits.  Abdomen: no tenderness, no masses palpated. No hepatosplenomegaly. Bowel sounds positive.  Musculoskeletal: no clubbing / cyanosis. No joint deformity upper and lower extremities. Good ROM, no contractures. Normal muscle tone.  Skin: no rashes, lesions, ulcers. No induration Neurologic: CN 2-12 grossly intact. Sensation intact, DTR normal. Strength 5/5 in all 4.  Psychiatric: Normal judgment and insight. Alert and oriented x 3. Normal mood.    Labs on Admission: I have personally reviewed following labs and imaging studies  CBC:  Recent Labs Lab 02/21/16 2050  WBC 8.4  NEUTROABS 6.8  HGB 11.9*  HCT 35.5*  MCV 99.4  PLT 55*   Basic Metabolic Panel:  Recent Labs Lab 02/21/16 2050  NA 130*  K 3.7  CL 100*  CO2 21*  GLUCOSE 343*  BUN <5*  CREATININE 0.84  CALCIUM 8.5*   GFR: Estimated Creatinine Clearance: 118.8 mL/min (by C-G formula based on SCr of 0.84 mg/dL). Liver Function Tests:  Recent Labs Lab 02/21/16 2050  AST 79*  ALT 47  ALKPHOS 231*  BILITOT 2.1*  PROT 6.8  ALBUMIN 2.8*   No results for input(s): LIPASE, AMYLASE in the last 168 hours.  Recent Labs Lab 02/21/16 2050  AMMONIA 88*   Coagulation Profile: No results for input(s): INR, PROTIME in the last 168 hours. Cardiac Enzymes: No results for input(s): CKTOTAL, CKMB, CKMBINDEX, TROPONINI in the last 168 hours. BNP  (last 3 results) No results for input(s): PROBNP in the last 8760 hours. HbA1C: No results for input(s): HGBA1C in the last 72 hours. CBG:  Recent Labs Lab 02/21/16 2055  GLUCAP 333*   Lipid Profile: No results for input(s): CHOL, HDL, LDLCALC, TRIG, CHOLHDL, LDLDIRECT in the last 72 hours. Thyroid Function Tests: No results for input(s): TSH, T4TOTAL, FREET4, T3FREE, THYROIDAB in the last 72 hours. Anemia Panel: No results for input(s): VITAMINB12, FOLATE, FERRITIN, TIBC, IRON, RETICCTPCT in the last 72 hours. Urine analysis:    Component Value Date/Time   COLORURINE YELLOW 02/21/2016 2116   APPEARANCEUR CLEAR 02/21/2016 2116   LABSPEC 1.035 (H) 02/21/2016 2116   PHURINE 6.0 02/21/2016 2116   GLUCOSEU >1000 (A) 02/21/2016 2116   HGBUR MODERATE (A) 02/21/2016 2116   BILIRUBINUR NEGATIVE 02/21/2016 2116   KETONESUR 15 (A) 02/21/2016 2116   PROTEINUR NEGATIVE 02/21/2016 2116   UROBILINOGEN 0.2 05/14/2015 1813   NITRITE NEGATIVE 02/21/2016 2116   LEUKOCYTESUR NEGATIVE 02/21/2016 2116   Sepsis Labs: @LABRCNTIP (procalcitonin:4,lacticidven:4) )No results found for this or any previous visit (from the past 240 hour(s)).   Radiological Exams on Admission: Dg Chest Port 1 View  Result Date: 02/21/2016 CLINICAL DATA:  Acute onset of altered mental status. Confusion. Hyperglycemia. EXAM: PORTABLE CHEST 1 VIEW COMPARISON:  Chest radiograph performed 12/17/2015 FINDINGS: The lungs are hypoexpanded. Vascular congestion is noted. Increased interstitial markings may reflect mild interstitial edema. No pleural effusion or pneumothorax is seen. The cardiomediastinal silhouette is borderline normal in size. No acute osseous abnormalities are identified. There is mild chronic deformity of the left mid clavicle. IMPRESSION: Lungs hypoexpanded. Vascular congestion noted. Increased interstitial markings may reflect mild interstitial edema. Electronically Signed   By: Garald Balding M.D.   On:  02/21/2016 21:32    EKG: Independently reviewed.  Assessment/Plan Principal Problem:   Sepsis ([place]) Active Problems:   Liver cirrhosis secondary to NASH   Type 2 diabetes mellitus ([place])    1. Sepsis - unclear source 1. Empiric zosyn and vanc 2. IVF in ED 3. Repeat CBC/BMP in AM 4. Blood and urine  cultures pending 5. Pro calcitonin ordered 6. Tap abdomen if it becomes tender or suspicious on exam (currently no abd pain at all per patient) 7. Trend lactate 2. DM2 - 1. Sensitive scale SSI AC/HS 2. Hold metformin 3. Cirrhosis - holding diuretics, continue lactulose   DVT prophylaxis: SCDs Code Status: Full Family Communication: Wife at bedside Consults called: None Admission status: Admit to inpatient   Etta Quill DO [facility] Pager [phone] from 7PM-7AM  If 7AM-7PM, please contact the day physician for the patient [email or web address] Password [facility]  02/21/2016, 10:39 PM

## 2016-02-22 ENCOUNTER — Inpatient Hospital Stay (HOSPITAL_COMMUNITY): Payer: PPO

## 2016-02-22 ENCOUNTER — Encounter (HOSPITAL_COMMUNITY): Payer: Self-pay | Admitting: Radiology

## 2016-02-22 DIAGNOSIS — K7290 Hepatic failure, unspecified without coma: Secondary | ICD-10-CM | POA: Diagnosis not present

## 2016-02-22 DIAGNOSIS — R6520 Severe sepsis without septic shock: Secondary | ICD-10-CM | POA: Diagnosis not present

## 2016-02-22 DIAGNOSIS — R7881 Bacteremia: Secondary | ICD-10-CM

## 2016-02-22 DIAGNOSIS — A419 Sepsis, unspecified organism: Secondary | ICD-10-CM | POA: Diagnosis not present

## 2016-02-22 DIAGNOSIS — K7460 Unspecified cirrhosis of liver: Secondary | ICD-10-CM | POA: Diagnosis not present

## 2016-02-22 DIAGNOSIS — K7581 Nonalcoholic steatohepatitis (NASH): Secondary | ICD-10-CM | POA: Diagnosis not present

## 2016-02-22 DIAGNOSIS — E119 Type 2 diabetes mellitus without complications: Secondary | ICD-10-CM | POA: Diagnosis not present

## 2016-02-22 DIAGNOSIS — B961 Klebsiella pneumoniae [K. pneumoniae] as the cause of diseases classified elsewhere: Secondary | ICD-10-CM | POA: Diagnosis not present

## 2016-02-22 LAB — BASIC METABOLIC PANEL
Anion gap: 7 (ref 5–15)
BUN: 5 mg/dL — AB (ref 6–20)
CHLORIDE: 105 mmol/L (ref 101–111)
CO2: 23 mmol/L (ref 22–32)
CREATININE: 0.76 mg/dL (ref 0.61–1.24)
Calcium: 7.9 mg/dL — ABNORMAL LOW (ref 8.9–10.3)
GFR calc Af Amer: >60 mL/min (ref >60)
GFR calc non Af Amer: >60 mL/min (ref >60)
Glucose, Bld: 148 mg/dL — ABNORMAL HIGH (ref 65–99)
Potassium: 3.6 mmol/L (ref 3.5–5.1)
Sodium: 135 mmol/L (ref 135–145)

## 2016-02-22 LAB — BLOOD CULTURE ID PANEL (REFLEXED)
ACINETOBACTER BAUMANNII: NOT DETECTED
CANDIDA ALBICANS: NOT DETECTED
CANDIDA GLABRATA: NOT DETECTED
CANDIDA KRUSEI: NOT DETECTED
CANDIDA PARAPSILOSIS: NOT DETECTED
CARBAPENEM RESISTANCE: NOT DETECTED
Candida tropicalis: NOT DETECTED
ENTEROBACTER CLOACAE COMPLEX: NOT DETECTED
ENTEROBACTERIACEAE SPECIES: DETECTED — AB
ESCHERICHIA COLI: NOT DETECTED
Enterococcus species: NOT DETECTED
Haemophilus influenzae: NOT DETECTED
KLEBSIELLA OXYTOCA: NOT DETECTED
KLEBSIELLA PNEUMONIAE: DETECTED — AB
Listeria monocytogenes: NOT DETECTED
Methicillin resistance: NOT DETECTED
NEISSERIA MENINGITIDIS: NOT DETECTED
PSEUDOMONAS AERUGINOSA: NOT DETECTED
Proteus species: NOT DETECTED
STREPTOCOCCUS AGALACTIAE: NOT DETECTED
STREPTOCOCCUS PNEUMONIAE: NOT DETECTED
STREPTOCOCCUS SPECIES: NOT DETECTED
Serratia marcescens: NOT DETECTED
Staphylococcus aureus (BCID): NOT DETECTED
Staphylococcus species: NOT DETECTED
Streptococcus pyogenes: NOT DETECTED
Vancomycin resistance: NOT DETECTED

## 2016-02-22 LAB — CBC
HEMATOCRIT: 31.8 % — AB (ref 39.0–52.0)
HEMOGLOBIN: 10.6 g/dL — AB (ref 13.0–17.0)
MCH: 33.1 pg (ref 26.0–34.0)
MCHC: 33.3 g/dL (ref 30.0–36.0)
MCV: 99.4 fL (ref 78.0–100.0)
Platelets: 42 10*3/uL — ABNORMAL LOW (ref 150–400)
RBC: 3.2 MIL/uL — ABNORMAL LOW (ref 4.22–5.81)
RDW: 14.3 % (ref 11.5–15.5)
WBC: 7.5 10*3/uL (ref 4.0–10.5)

## 2016-02-22 LAB — GLUCOSE, CAPILLARY
GLUCOSE-CAPILLARY: 329 mg/dL — AB (ref 65–99)
Glucose-Capillary: 181 mg/dL — ABNORMAL HIGH (ref 65–99)
Glucose-Capillary: 253 mg/dL — ABNORMAL HIGH (ref 65–99)
Glucose-Capillary: 265 mg/dL — ABNORMAL HIGH (ref 65–99)

## 2016-02-22 LAB — PROCALCITONIN: Procalcitonin: <0.1 ng/mL

## 2016-02-22 LAB — MRSA PCR SCREENING: MRSA BY PCR: NEGATIVE

## 2016-02-22 LAB — LACTIC ACID, PLASMA: LACTIC ACID, VENOUS: 2.9 mmol/L — AB (ref 0.5–1.9)

## 2016-02-22 MED ORDER — AMLODIPINE BESYLATE 10 MG PO TABS: 10.0000 mg | ORAL_TABLET | Freq: Every day | ORAL | Status: DC

## 2016-02-22 MED ORDER — DEXTROSE 5 % IV SOLN
2.0000 g | INTRAVENOUS | Status: DC
  Administered 2016-02-22 – 2016-02-23 (×2): 2 g via INTRAVENOUS
  Filled 2016-02-22 (×3): qty 2

## 2016-02-22 MED ORDER — SODIUM CHLORIDE 0.9 % IV BOLUS (SEPSIS)
1000.0000 mL | Freq: Once | INTRAVENOUS | Status: AC
  Administered 2016-02-22: 1000 mL via INTRAVENOUS
  Administered 2016-02-22: 13:00:00 via INTRAVENOUS

## 2016-02-22 MED ORDER — VANCOMYCIN HCL 10 G IV SOLR
1250.0000 mg | Freq: Two times a day (BID) | INTRAVENOUS | Status: DC
  Administered 2016-02-22: 1250 mg via INTRAVENOUS
  Filled 2016-02-22 (×2): qty 1250

## 2016-02-22 MED ORDER — PIPERACILLIN-TAZOBACTAM 3.375 G IVPB
3.3750 g | Freq: Three times a day (TID) | INTRAVENOUS | Status: DC
  Administered 2016-02-22 (×2): 3.375 g via INTRAVENOUS
  Filled 2016-02-22 (×4): qty 50

## 2016-02-22 MED ORDER — DIATRIZOATE MEGLUMINE & SODIUM 66-10 % PO SOLN
ORAL | Status: AC
  Administered 2016-02-22: 17:00:00
  Filled 2016-02-22: qty 30

## 2016-02-22 MED ORDER — IOPAMIDOL (ISOVUE-300) INJECTION 61%
INTRAVENOUS | Status: AC
  Administered 2016-02-22: 100 mL
  Filled 2016-02-22: qty 100

## 2016-02-22 NOTE — Progress Notes (Signed)
PHARMACY - PHYSICIAN COMMUNICATION CRITICAL VALUE ALERT - BLOOD CULTURE IDENTIFICATION (BCID)  Results for orders placed or performed during the hospital encounter of 02/21/16  Blood Culture ID Panel (Reflexed) (Collected: 02/21/2016  8:50 PM)  Result Value Ref Range   Enterococcus species NOT DETECTED NOT DETECTED   Vancomycin resistance NOT DETECTED NOT DETECTED   Listeria monocytogenes NOT DETECTED NOT DETECTED   Staphylococcus species NOT DETECTED NOT DETECTED   Staphylococcus aureus NOT DETECTED NOT DETECTED   Methicillin resistance NOT DETECTED NOT DETECTED   Streptococcus species NOT DETECTED NOT DETECTED   Streptococcus agalactiae NOT DETECTED NOT DETECTED   Streptococcus pneumoniae NOT DETECTED NOT DETECTED   Streptococcus pyogenes NOT DETECTED NOT DETECTED   Acinetobacter baumannii NOT DETECTED NOT DETECTED   Enterobacteriaceae species DETECTED (A) NOT DETECTED   Enterobacter cloacae complex NOT DETECTED NOT DETECTED   Escherichia coli NOT DETECTED NOT DETECTED   Klebsiella oxytoca NOT DETECTED NOT DETECTED   Klebsiella pneumoniae DETECTED (A) NOT DETECTED   Proteus species NOT DETECTED NOT DETECTED   Serratia marcescens NOT DETECTED NOT DETECTED   Carbapenem resistance NOT DETECTED NOT DETECTED   Haemophilus influenzae NOT DETECTED NOT DETECTED   Neisseria meningitidis NOT DETECTED NOT DETECTED   Pseudomonas aeruginosa NOT DETECTED NOT DETECTED   Candida albicans NOT DETECTED NOT DETECTED   Candida glabrata NOT DETECTED NOT DETECTED   Candida krusei NOT DETECTED NOT DETECTED   Candida parapsilosis NOT DETECTED NOT DETECTED   Candida tropicalis NOT DETECTED NOT DETECTED    Name of physician (or Provider) Contacted: Dr. Hollice Gong  Changes to prescribed antibiotics required: Patient currently on vancomycin/zosyn. Will discontinue vancomycin and continue zosyn due to potential for intra-abdominal source.  Dimitri Ped, PharmD. PGY-2 Pharmacy Resident Pager:  [phone] 02/22/2016  10:00 AM

## 2016-02-22 NOTE — Consult Note (Signed)
Date of Admission:  02/21/2016  Date of Consult:  02/22/2016  Reason for Consult: klebsiella PNA bacteremia Referring Physician: Dr. Hollice Gong   HPI: Adam Butler is an 60 y.o. male with CAD, cryptogenic cirrhosis (likely NASH) seen several times by our group including for [facility] Streptococcal bacteremia sp TEE without vegetations, admission with FUO who experienced abrupt onset of fevers, shaking and chills and sought evaluation at [facility]. He was admitted and blood cultures taken. He had urine cultures taken though he denies urinary symptoms. He claims he has not had prior episodes of SBP and that he had been taking his rifaxmin. His admission blood cultures are now growing 2/2 blood cultures for klebsiella PNA (not ESBL or CRE).     Past Medical History:  Diagnosis Date  . Arthritis   . Bacteremia   . Cholelithiasis   . Cirrhosis ([place]) 2011   Cryptogenic, Likely NASH. Family/pt deny EtOH. HCV, HBV, HAV negative. ANA negative. AMA positive. Ascites 12/11  . Coronary artery disease    Inferior MI 12/11; LHC with occluded mid CFX and 80% proximal RCA. EF 55%. He had 3.0 x 28 vision BMS to CFX  . Diastolic CHF, acute ([place])    Echo 12/11 with ef 50-55% and mild LVH. EF 55% by LV0gram in 12/11  . Esophageal varices ([place]) 2011, 2013   no hx acute variceal bleed  . GI bleed    12/11: Etiology not clearly defined. EGD: nonbleeding esophageal varices.  . Hepatic encephalopathy ([place]) 2011, 12/2013  . Hx of echocardiogram    Echo 5/16:  EF 55-60%, no RWMA, mod LAE  . Hyperlipemia   . Hypertension   . Myocardial infarction [facility]) ? 2012  . PFO (patent foramen ovale): Per TEE 04/20/2015 04/20/2015  . Portal hypertension ([place])   . Rectal varices   . Rectal varices   . S/P coronary artery stent placement 06/2010  . Shortness of breath dyspnea   . SVT (supraventricular tachycardia) ([place])    1/12: appeared to be an ectopic atrial tachycardia. Required DCCV with  hemodynamic instability  . Thrombocytopenia ([place])   . Type II diabetes mellitus ([place]) 2011    Past Surgical History:  Procedure Laterality Date  . APPENDECTOMY    . CARDIAC CATHETERIZATION  07/01/2010   BMS to CFX.  [facility] COLONOSCOPY  04/13/2012   Procedure: COLONOSCOPY;  Surgeon: Inda Castle, MD;  Location: [facility] ENDOSCOPY;  Service: Endoscopy;  Laterality: N/A;  . CORONARY STENT PLACEMENT  06/30/2010   CFX   Distal        . ESOPHAGEAL BANDING N/A 01/01/2014   Procedure: ESOPHAGEAL BANDING;  Surgeon: Jerene Bears, MD;  Location: [place] ENDOSCOPY;  Service: Endoscopy;  Laterality: N/A;  . ESOPHAGOGASTRODUODENOSCOPY  04/13/2012   Procedure: ESOPHAGOGASTRODUODENOSCOPY (EGD);  Surgeon: Inda Castle, MD;  Location: [facility];  Service: Endoscopy;  Laterality: N/A;  . ESOPHAGOGASTRODUODENOSCOPY N/A 01/01/2014   Procedure: ESOPHAGOGASTRODUODENOSCOPY (EGD);  Surgeon: Jerene Bears, MD;  Location: [facility] ENDOSCOPY;  Service: Endoscopy;  Laterality: N/A;  . LEFT HEART CATHETERIZATION WITH CORONARY ANGIOGRAM N/A 10/15/2014   Procedure: LEFT HEART CATHETERIZATION WITH CORONARY ANGIOGRAM;  Surgeon: Peter M Martinique, MD;  Location: [facility] CATH LAB;  Service: Cardiovascular;  Laterality: N/A;  . orif r leg    . TEE WITHOUT CARDIOVERSION N/A 04/20/2015   Procedure: TRANSESOPHAGEAL ECHOCARDIOGRAM (TEE);  Surgeon: Fay Records, MD;  Location: [place];  Service: Cardiovascular;  Laterality: N/A;    Social History:  reports that he has been smoking E-cigarettes.  He has a 36.00 pack-year smoking history. He has never used smokeless tobacco. He reports that he does not drink alcohol or use drugs.   Family History  Problem Relation Age of Onset  . Heart attack Brother 28    MI  . Diabetes Brother   . Heart attack Father   . Diabetes Father   . COPD Father   . COPD Mother   . Heart attack Brother   . Stroke Neg Hx     Allergies  Allergen Reactions  . Isosorbide Nitrate Other (See Comments)    Nose bleeds       Medications: I have reviewed patients current medications as documented in Epic Anti-infectives    Start     Dose/Rate Route Frequency Ordered Stop   02/22/16 1700  cefTRIAXone (ROCEPHIN) 2 g in dextrose 5 % 50 mL IVPB     2 g 100 mL/hr over 30 Minutes Intravenous Every 24 hours 02/22/16 1511     02/22/16 1000  vancomycin (VANCOCIN) 1,250 mg in sodium chloride 0.9 % 250 mL IVPB  Status:  Discontinued     1,250 mg 166.7 mL/hr over 90 Minutes Intravenous Every 12 hours 02/22/16 0117 02/22/16 1016   02/22/16 0600  piperacillin-tazobactam (ZOSYN) IVPB 3.375 g  Status:  Discontinued     3.375 g 12.5 mL/hr over 240 Minutes Intravenous Every 8 hours 02/22/16 0117 02/22/16 1511   02/21/16 2230  rifaximin (XIFAXAN) tablet 550 mg     550 mg Oral 2 times daily 02/21/16 2223     02/21/16 2145  piperacillin-tazobactam (ZOSYN) IVPB 3.375 g     3.375 g 100 mL/hr over 30 Minutes Intravenous  Once 02/21/16 2130 02/21/16 2320   02/21/16 2145  vancomycin (VANCOCIN) IVPB 1000 mg/200 mL premix  Status:  Discontinued     1,000 mg 200 mL/hr over 60 Minutes Intravenous  Once 02/21/16 2130 02/21/16 2136   02/21/16 2145  vancomycin (VANCOCIN) 2,000 mg in sodium chloride 0.9 % 500 mL IVPB     2,000 mg 250 mL/hr over 120 Minutes Intravenous STAT 02/21/16 2136 02/22/16 0015         ROS:  as in HPI otherwise remainder of 12 point Review of Systems is negative  Blood pressure 99/87, pulse 66, temperature 97.7 F (36.5 C), temperature source Oral, resp. rate 14, height _0  (1.702 m), weight 270 lb (122.5 kg), SpO2 100 %. General: Alert and awake, oriented x3, not in any acute distress. HEENT: anicteric sclera,  EOMI, oropharynx clear and without exudate poor dentiion Cardiovascular: regular rate, normal r,  no murmur rubs or gallops Pulmonary: clear to auscultation bilaterally, no wheezing, rales or rhonchi Gastrointestinal: soft nontender, protuberant with , normal bowel sounds,  Musculoskeletal: no  overt deformity Skin, scar from prior surgery on right shin and bruises from lovenox on abdomen Neuro: nonfocal, strength and sensation intact   Results for orders placed or performed during the hospital encounter of 02/21/16 (from the past 48 hour(s))  Comprehensive metabolic panel     Status: Abnormal   Collection Time: 02/21/16  8:50 PM  Result Value Ref Range   Sodium 130 (L) 135 - 145 mmol/L   Potassium 3.7 3.5 - 5.1 mmol/L   Chloride 100 (L) 101 - 111 mmol/L   CO2 21 (L) 22 - 32 mmol/L   Glucose, Bld 343 (H) 65 - 99 mg/dL   BUN <5 (L) 6 - 20 mg/dL   Creatinine, Ser  0.84 0.61 - 1.24 mg/dL   Calcium 8.5 (L) 8.9 - 10.3 mg/dL   Total Protein 6.8 6.5 - 8.1 g/dL   Albumin 2.8 (L) 3.5 - 5.0 g/dL   AST 79 (H) 15 - 41 U/L   ALT 47 17 - 63 U/L   Alkaline Phosphatase 231 (H) 38 - 126 U/L   Total Bilirubin 2.1 (H) 0.3 - 1.2 mg/dL   GFR calc non Af Amer >60 >60 mL/min   GFR calc Af Amer >60 >60 mL/min    Comment: (NOTE) The eGFR has been calculated using the CKD EPI equation. This calculation has not been validated in all clinical situations. eGFR's persistently <60 mL/min signify possible Chronic Kidney Disease.    Anion gap 9 5 - 15  CBC with Differential     Status: Abnormal   Collection Time: 02/21/16  8:50 PM  Result Value Ref Range   WBC 8.4 4.0 - 10.5 K/uL   RBC 3.57 (L) 4.22 - 5.81 MIL/uL   Hemoglobin 11.9 (L) 13.0 - 17.0 g/dL   HCT 35.5 (L) 39.0 - 52.0 %   MCV 99.4 78.0 - 100.0 fL   MCH 33.3 26.0 - 34.0 pg   MCHC 33.5 30.0 - 36.0 g/dL   RDW 14.2 11.5 - 15.5 %   Platelets 55 (L) 150 - 400 K/uL    Comment: CONSISTENT WITH PREVIOUS RESULT   Neutrophils Relative % 82 %   Neutro Abs 6.8 1.7 - 7.7 K/uL   Lymphocytes Relative 5 %   Lymphs Abs 0.4 (L) 0.7 - 4.0 K/uL   Monocytes Relative 13 %   Monocytes Absolute 1.1 (H) 0.1 - 1.0 K/uL   Eosinophils Relative 0 %   Eosinophils Absolute 0.0 0.0 - 0.7 K/uL   Basophils Relative 0 %   Basophils Absolute 0.0 0.0 - 0.1 K/uL    Culture, blood (Routine x 2)     Status: None (Preliminary result)   Collection Time: 02/21/16  8:50 PM  Result Value Ref Range   Specimen Description BLOOD RIGHT WRIST    Special Requests BOTTLES DRAWN AEROBIC AND ANAEROBIC 5CC EA    Culture  Setup Time      GRAM NEGATIVE RODS IN BOTH AEROBIC AND ANAEROBIC BOTTLES CRITICAL RESULT CALLED TO, READ BACK BY AND VERIFIED WITH: T STONE,PHARMD AT 1000 02/22/16 BY M WILSON    Culture GRAM NEGATIVE RODS    Report Status PENDING   Ammonia     Status: Abnormal   Collection Time: 02/21/16  8:50 PM  Result Value Ref Range   Ammonia 88 (H) 9 - 35 umol/L  Procalcitonin     Status: None   Collection Time: 02/21/16  8:50 PM  Result Value Ref Range   Procalcitonin <0.10 ng/mL    Comment:        Interpretation: PCT (Procalcitonin) <= 0.5 ng/mL: Systemic infection (sepsis) is not likely. Local bacterial infection is possible. (NOTE)         ICU PCT Algorithm               Non ICU PCT Algorithm    ----------------------------     ------------------------------         PCT < 0.25 ng/mL                 PCT < 0.1 ng/mL     Stopping of antibiotics            Stopping of antibiotics       strongly  encouraged.               strongly encouraged.    ----------------------------     ------------------------------       PCT level decrease by               PCT < 0.25 ng/mL       >= 80% from peak PCT       OR PCT 0.25 - 0.5 ng/mL          Stopping of antibiotics                                             encouraged.     Stopping of antibiotics           encouraged.    ----------------------------     ------------------------------       PCT level decrease by              PCT >= 0.25 ng/mL       < 80% from peak PCT        AND PCT >= 0.5 ng/mL            Continuin g antibiotics                                              encouraged.       Continuing antibiotics            encouraged.    ----------------------------     ------------------------------      PCT level increase compared          PCT > 0.5 ng/mL         with peak PCT AND          PCT >= 0.5 ng/mL             Escalation of antibiotics                                          strongly encouraged.      Escalation of antibiotics        strongly encouraged.   Blood Culture ID Panel (Reflexed)     Status: Abnormal   Collection Time: 02/21/16  8:50 PM  Result Value Ref Range   Enterococcus species NOT DETECTED NOT DETECTED   Vancomycin resistance NOT DETECTED NOT DETECTED   Listeria monocytogenes NOT DETECTED NOT DETECTED   Staphylococcus species NOT DETECTED NOT DETECTED   Staphylococcus aureus NOT DETECTED NOT DETECTED   Methicillin resistance NOT DETECTED NOT DETECTED   Streptococcus species NOT DETECTED NOT DETECTED   Streptococcus agalactiae NOT DETECTED NOT DETECTED   Streptococcus pneumoniae NOT DETECTED NOT DETECTED   Streptococcus pyogenes NOT DETECTED NOT DETECTED   Acinetobacter baumannii NOT DETECTED NOT DETECTED   Enterobacteriaceae species DETECTED (A) NOT DETECTED    Comment: CRITICAL RESULT CALLED TO, READ BACK BY AND VERIFIED WITH: T. Stone Pharm.D. 10:00 02/22/16  (wilsonm)    Enterobacter cloacae complex NOT DETECTED NOT DETECTED   Escherichia coli NOT DETECTED NOT DETECTED   Klebsiella oxytoca NOT DETECTED NOT DETECTED   Klebsiella pneumoniae DETECTED (A) NOT DETECTED  Comment: CRITICAL RESULT CALLED TO, READ BACK BY AND VERIFIED WITH: T. Stone Pharm.D. 10:00 02/22/16  (wilsonm)    Proteus species NOT DETECTED NOT DETECTED   Serratia marcescens NOT DETECTED NOT DETECTED   Carbapenem resistance NOT DETECTED NOT DETECTED   Haemophilus influenzae NOT DETECTED NOT DETECTED   Neisseria meningitidis NOT DETECTED NOT DETECTED   Pseudomonas aeruginosa NOT DETECTED NOT DETECTED   Candida albicans NOT DETECTED NOT DETECTED   Candida glabrata NOT DETECTED NOT DETECTED   Candida krusei NOT DETECTED NOT DETECTED   Candida parapsilosis NOT DETECTED NOT DETECTED    Candida tropicalis NOT DETECTED NOT DETECTED  Culture, blood (Routine x 2)     Status: None (Preliminary result)   Collection Time: 02/21/16  8:55 PM  Result Value Ref Range   Specimen Description BLOOD LEFT ANTECUBITAL    Special Requests IN PEDIATRIC BOTTLE 1CC    Culture  Setup Time      GRAM NEGATIVE RODS IN PEDIATRIC BOTTLE CRITICAL RESULT CALLED TO, READ BACK BY AND VERIFIED WITH: T STONE,PHARMD AT 1000 02/22/16 BY M WILSON    Culture GRAM NEGATIVE RODS    Report Status PENDING   CBG monitoring, ED     Status: Abnormal   Collection Time: 02/21/16  8:55 PM  Result Value Ref Range   Glucose-Capillary 333 (H) 65 - 99 mg/dL  I-Stat CG4 Lactic Acid, ED     Status: Abnormal   Collection Time: 02/21/16  9:06 PM  Result Value Ref Range   Lactic Acid, Venous 2.63 (HH) 0.5 - 1.9 mmol/L   Comment NOTIFIED PHYSICIAN   Urinalysis, Routine w reflex microscopic     Status: Abnormal   Collection Time: 02/21/16  9:16 PM  Result Value Ref Range   Color, Urine YELLOW YELLOW   APPearance CLEAR CLEAR   Specific Gravity, Urine 1.035 (H) 1.005 - 1.030   pH 6.0 5.0 - 8.0   Glucose, UA >1000 (A) NEGATIVE mg/dL   Hgb urine dipstick MODERATE (A) NEGATIVE   Bilirubin Urine NEGATIVE NEGATIVE   Ketones, ur 15 (A) NEGATIVE mg/dL   Protein, ur NEGATIVE NEGATIVE mg/dL   Nitrite NEGATIVE NEGATIVE   Leukocytes, UA NEGATIVE NEGATIVE  Urine microscopic-add on     Status: Abnormal   Collection Time: 02/21/16  9:16 PM  Result Value Ref Range   Squamous Epithelial / LPF 0-5 (A) NONE SEEN   WBC, UA 0-5 0 - 5 WBC/hpf   RBC / HPF 6-30 0 - 5 RBC/hpf   Bacteria, UA RARE (A) NONE SEEN  CBG monitoring, ED     Status: Abnormal   Collection Time: 02/21/16 10:49 PM  Result Value Ref Range   Glucose-Capillary 278 (H) 65 - 99 mg/dL  MRSA PCR Screening     Status: None   Collection Time: 02/22/16 12:01 AM  Result Value Ref Range   MRSA by PCR NEGATIVE NEGATIVE    Comment:        The GeneXpert MRSA Assay  (FDA approved for NASAL specimens only), is one component of a comprehensive MRSA colonization surveillance program. It is not intended to diagnose MRSA infection nor to guide or monitor treatment for MRSA infections.   CBC     Status: Abnormal   Collection Time: 02/22/16  4:15 AM  Result Value Ref Range   WBC 7.5 4.0 - 10.5 K/uL    Comment: REPEATED TO VERIFY   RBC 3.20 (L) 4.22 - 5.81 MIL/uL   Hemoglobin 10.6 (L) 13.0 - 17.0 g/dL  Comment: REPEATED TO VERIFY   HCT 31.8 (L) 39.0 - 52.0 %   MCV 99.4 78.0 - 100.0 fL   MCH 33.1 26.0 - 34.0 pg   MCHC 33.3 30.0 - 36.0 g/dL   RDW 14.3 11.5 - 15.5 %   Platelets 42 (L) 150 - 400 K/uL    Comment: REPEATED TO VERIFY CONSISTENT WITH PREVIOUS RESULT   Basic metabolic panel     Status: Abnormal   Collection Time: 02/22/16  4:15 AM  Result Value Ref Range   Sodium 135 135 - 145 mmol/L   Potassium 3.6 3.5 - 5.1 mmol/L   Chloride 105 101 - 111 mmol/L   CO2 23 22 - 32 mmol/L   Glucose, Bld 148 (H) 65 - 99 mg/dL   BUN 5 (L) 6 - 20 mg/dL   Creatinine, Ser 0.76 0.61 - 1.24 mg/dL   Calcium 7.9 (L) 8.9 - 10.3 mg/dL   GFR calc non Af Amer >60 >60 mL/min   GFR calc Af Amer >60 >60 mL/min    Comment: (NOTE) The eGFR has been calculated using the CKD EPI equation. This calculation has not been validated in all clinical situations. eGFR's persistently <60 mL/min signify possible Chronic Kidney Disease.    Anion gap 7 5 - 15  Glucose, capillary     Status: Abnormal   Collection Time: 02/22/16  8:27 AM  Result Value Ref Range   Glucose-Capillary 181 (H) 65 - 99 mg/dL   Comment 1 Notify RN    Comment 2 Document in Chart   Lactic acid, plasma     Status: Abnormal   Collection Time: 02/22/16  9:47 AM  Result Value Ref Range   Lactic Acid, Venous 2.9 (HH) 0.5 - 1.9 mmol/L    Comment: CRITICAL RESULT CALLED TO, READ BACK BY AND VERIFIED WITH: S.DOTY,RN 02/22/16 1104 BY BSLADE   Glucose, capillary     Status: Abnormal   Collection Time:  02/22/16 11:36 AM  Result Value Ref Range   Glucose-Capillary 329 (H) 65 - 99 mg/dL   Comment 1 Notify RN    Comment 2 Document in Chart   Lactic acid, plasma     Status: Abnormal   Collection Time: 02/22/16 12:56 PM  Result Value Ref Range   Lactic Acid, Venous 2.3 (HH) 0.5 - 1.9 mmol/L    Comment: CRITICAL RESULT CALLED TO, READ BACK BY AND VERIFIED WITH: C.SOSA,RN 02/22/16 11359 BY BSLADE   Glucose, capillary     Status: Abnormal   Collection Time: 02/22/16  5:43 PM  Result Value Ref Range   Glucose-Capillary 253 (H) 65 - 99 mg/dL   Comment 1 Notify RN    Comment 2 Document in Chart    _0 (sdes,specrequest,cult,reptstatus)   ) Recent Results (from the past 720 hour(s))  Culture, blood (Routine x 2)     Status: None (Preliminary result)   Collection Time: 02/21/16  8:50 PM  Result Value Ref Range Status   Specimen Description BLOOD RIGHT WRIST  Final   Special Requests BOTTLES DRAWN AEROBIC AND ANAEROBIC 5CC EA  Final   Culture  Setup Time   Final    GRAM NEGATIVE RODS IN BOTH AEROBIC AND ANAEROBIC BOTTLES CRITICAL RESULT CALLED TO, READ BACK BY AND VERIFIED WITH: T STONE,PHARMD AT 1000 02/22/16 BY M WILSON    Culture GRAM NEGATIVE RODS  Final   Report Status PENDING  Incomplete  Blood Culture ID Panel (Reflexed)     Status: Abnormal   Collection Time: 02/21/16  8:50 PM  Result Value Ref Range Status   Enterococcus species NOT DETECTED NOT DETECTED Final   Vancomycin resistance NOT DETECTED NOT DETECTED Final   Listeria monocytogenes NOT DETECTED NOT DETECTED Final   Staphylococcus species NOT DETECTED NOT DETECTED Final   Staphylococcus aureus NOT DETECTED NOT DETECTED Final   Methicillin resistance NOT DETECTED NOT DETECTED Final   Streptococcus species NOT DETECTED NOT DETECTED Final   Streptococcus agalactiae NOT DETECTED NOT DETECTED Final   Streptococcus pneumoniae NOT DETECTED NOT DETECTED Final   Streptococcus pyogenes NOT DETECTED NOT DETECTED Final    Acinetobacter baumannii NOT DETECTED NOT DETECTED Final   Enterobacteriaceae species DETECTED (A) NOT DETECTED Final    Comment: CRITICAL RESULT CALLED TO, READ BACK BY AND VERIFIED WITH: T. Stone Pharm.D. 10:00 02/22/16  (wilsonm)    Enterobacter cloacae complex NOT DETECTED NOT DETECTED Final   Escherichia coli NOT DETECTED NOT DETECTED Final   Klebsiella oxytoca NOT DETECTED NOT DETECTED Final   Klebsiella pneumoniae DETECTED (A) NOT DETECTED Final    Comment: CRITICAL RESULT CALLED TO, READ BACK BY AND VERIFIED WITH: T. Stone Pharm.D. 10:00 02/22/16  (wilsonm)    Proteus species NOT DETECTED NOT DETECTED Final   Serratia marcescens NOT DETECTED NOT DETECTED Final   Carbapenem resistance NOT DETECTED NOT DETECTED Final   Haemophilus influenzae NOT DETECTED NOT DETECTED Final   Neisseria meningitidis NOT DETECTED NOT DETECTED Final   Pseudomonas aeruginosa NOT DETECTED NOT DETECTED Final   Candida albicans NOT DETECTED NOT DETECTED Final   Candida glabrata NOT DETECTED NOT DETECTED Final   Candida krusei NOT DETECTED NOT DETECTED Final   Candida parapsilosis NOT DETECTED NOT DETECTED Final   Candida tropicalis NOT DETECTED NOT DETECTED Final  Culture, blood (Routine x 2)     Status: None (Preliminary result)   Collection Time: 02/21/16  8:55 PM  Result Value Ref Range Status   Specimen Description BLOOD LEFT ANTECUBITAL  Final   Special Requests IN PEDIATRIC BOTTLE 1CC  Final   Culture  Setup Time   Final    GRAM NEGATIVE RODS IN PEDIATRIC BOTTLE CRITICAL RESULT CALLED TO, READ BACK BY AND VERIFIED WITH: T STONE,PHARMD AT 1000 02/22/16 BY M WILSON    Culture GRAM NEGATIVE RODS  Final   Report Status PENDING  Incomplete  MRSA PCR Screening     Status: None   Collection Time: 02/22/16 12:01 AM  Result Value Ref Range Status   MRSA by PCR NEGATIVE NEGATIVE Final    Comment:        The GeneXpert MRSA Assay (FDA approved for NASAL specimens only), is one component of  a comprehensive MRSA colonization surveillance program. It is not intended to diagnose MRSA infection nor to guide or monitor treatment for MRSA infections.      Impression/Recommendation  Principal Problem:   Sepsis ([place]) Active Problems:   Liver cirrhosis secondary to NASH   Type 2 diabetes mellitus ([place])   Bacteremia   Adam Butler is a 60 y.o. male with cryptogenic cirrhosis admited with klebsiella PNA bacteremia without clear cut source  I suspect he may simply have had translocation of gut bacteria into blood stream and due to hepatic dysfunction not been able to clear this bacteremia  He could certainly have SBP as well  I would image his abdomen to ensure no intrabdominal abscess that is the source  followup cultures and narrow abx as able. I am changing him from zosyn to ceftriaxone    02/22/2016,  7:18 PM   Thank you so much for this interesting consult  [place] for [place] [phone] (pager) [phone] (office) 02/22/2016, 7:18 PM  Rhina [place] 02/22/2016, 7:18 PM

## 2016-02-22 NOTE — Progress Notes (Signed)
Pt with transporter to go to CT.  Pt also has bed available on 5w.  Will attempt to give report, and pt go to 5w11 after scan.

## 2016-02-22 NOTE — Care Management Note (Signed)
Case Management Note  Patient Details  Name: Adam Butler MRN: [id number] Date of Birth: 1956/02/03  Subjective/Objective:     Patient is from home with spouse, present with sepsis,  pta indep, he states he has had [facility] services before but can not remember with who.  Patient has PCP Shanon Ace at [facility], he has medication coverage, but in doughnut whole, and transportation at discharge.   Patient states he uses a cane sometimes at home.  NCM will cont to follow for dc needs.           Action/Plan:   Expected Discharge Date:  02/24/16               Expected Discharge Plan:  Home/Self Care  In-House Referral:     Discharge planning Services  CM Consult  Post Acute Care Choice:    Choice offered to:     DME Arranged:    DME Agency:     HH Arranged:    HH Agency:     Status of Service:  In process, will continue to follow  If discussed at Long Length of Stay Meetings, dates discussed:    Additional Comments:  Zenon Mayo, RN 02/22/2016, 3:39 PM

## 2016-02-22 NOTE — Consult Note (Signed)
   [facility] CM Inpatient Consult   02/22/2016  Adam Butler 04/13/1956 [id number]   [name] is active with [facility] services. He was recently transitioned to [facility]. Sent notification to inpatient RNCM to make aware that [facility] is active. Will continue to follow along and make appropriate referrals to [facility] team. Please see patient outreach notes under chart review to see further [place] details.  Marthenia Rolling, MSN-Ed, RN,BSN [facility] Liaison [phone]

## 2016-02-22 NOTE — Progress Notes (Signed)
Report given at this time to [facility] nurse

## 2016-02-22 NOTE — Progress Notes (Signed)
Inpatient Diabetes Program Recommendations  AACE/ADA: New Consensus Statement on Inpatient Glycemic Control (2015)  Target Ranges:  Prepandial:   less than 140 mg/dL      Peak postprandial:   less than 180 mg/dL (1-2 hours)      Critically ill patients:  140 - 180 mg/dL   Lab Results  Component Value Date   GLUCAP 329 (H) 02/22/2016   HGBA1C 6.7 (H) 09/29/2015    Review of Glycemic Control  Diabetes history: DM2 Outpatient Diabetes medications: Humulin R 0-8 units tidwc Current orders for Inpatient glycemic control: Novolog sensitive tidwc and hs  Inpatient Diabetes Program Recommendations:   Change diet to CHO mod med heart healthy Updated HgbA1C to assess glycemic control prior to admission. Add Novolog 4 units tidwc for meal coverage insulin  Will continue to follow. Thank you. Lorenda Peck, RD, LDN, CDE Inpatient Diabetes Coordinator [phone]

## 2016-02-22 NOTE — Progress Notes (Signed)
PROGRESS NOTE  HUDSON MAJKOWSKI IRS:[id number] DOB: 1955/07/17 DOA: 02/21/2016 PCP: [name], NP  HPI/Recap of past 24 hours: Adam Butler is a 60 y.o. male with medical history significant of NASH cirrhosis.  Patient presents to the ED with c/o acute onset of shaking chills, fever to 103 on the evening of admission.  He was feeling fine earlier, went to meet spouse at restaurant when they noticed shaking chills.    This AM has been ambulating in halls and feels well. No pain anywhere, no nausea, does have a very slight cough that started this AM. Not productive. Has 2/2 blood cultures positive for K. Pneumonia.   Assessment/Plan: Principal Problem:   Sepsis ([place]) - resolving, lactate still high so will bolus today and cont IVF, cont empiric Zosyn Active Problems:   Bacteremia - k pneumonia, unclear source - await blood and urine cx - repeat blood cx now - TTE - ID consult   Liver cirrhosis secondary to NASH   Type 2 diabetes mellitus ([place])  Code Status: FULL   Family Communication: None    Disposition Plan: To home when ready.   Consultants:  ID   Procedures:  None   Antimicrobials:  Vanco 8/13-8/14  Zosyn 8/13-->    Objective: Vitals:   02/22/16 1000 02/22/16 1100 02/22/16 1141 02/22/16 1200  BP:   (!) 98/46   Pulse: 96 72 71 73  Resp: 20 19 (!) 21 19  Temp:   97.6 F (36.4 C)   TempSrc:   Oral   SpO2: 93% 93% 96% 99%  Weight:      Height:        Intake/Output Summary (Last 24 hours) at 02/22/16 1304 Last data filed at 02/22/16 1100  Gross per 24 hour  Intake           3912.5 ml  Output              500 ml  Net           3412.5 ml   Filed Weights   02/21/16 2059  Weight: 122.5 kg (270 lb)    Exam: General:  Alert, oriented, calm, in no acute distress Eyes: pupils round and reactive to light and accomodation, clear sclerea Neck: supple, no masses, trachea mildline  Cardiovascular: RRR, no murmurs or rubs, no peripheral edema    Respiratory: clear to auscultation bilaterally, no wheezes, no crackles  Abdomen: soft, nontender, nondistended, normal bowel tones heard  Skin: dry, no rashes  Musculoskeletal: no joint effusions, normal range of motion  Psychiatric: appropriate affect, normal speech  Neurologic: extraocular muscles intact, clear speech, moving all extremities with intact sensorium   Data Reviewed: CBC:  Recent Labs Lab 02/21/16 2050 02/22/16 0415  WBC 8.4 7.5  NEUTROABS 6.8  --   HGB 11.9* 10.6*  HCT 35.5* 31.8*  MCV 99.4 99.4  PLT 55* 42*   Basic Metabolic Panel:  Recent Labs Lab 02/21/16 2050 02/22/16 0415  NA 130* 135  K 3.7 3.6  CL 100* 105  CO2 21* 23  GLUCOSE 343* 148*  BUN <5* 5*  CREATININE 0.84 0.76  CALCIUM 8.5* 7.9*   GFR: Estimated Creatinine Clearance: 124.7 mL/min (by C-G formula based on SCr of 0.8 mg/dL). Liver Function Tests:  Recent Labs Lab 02/21/16 2050  AST 79*  ALT 47  ALKPHOS 231*  BILITOT 2.1*  PROT 6.8  ALBUMIN 2.8*   No results for input(s): LIPASE, AMYLASE in the last 168 hours.  Recent  Labs Lab 02/21/16 2050  AMMONIA 88*   Coagulation Profile: No results for input(s): INR, PROTIME in the last 168 hours. Cardiac Enzymes: No results for input(s): CKTOTAL, CKMB, CKMBINDEX, TROPONINI in the last 168 hours. BNP (last 3 results) No results for input(s): PROBNP in the last 8760 hours. HbA1C: No results for input(s): HGBA1C in the last 72 hours. CBG:  Recent Labs Lab 02/21/16 2055 02/21/16 2249 02/22/16 0827 02/22/16 1136  GLUCAP 333* 278* 181* 329*   Lipid Profile: No results for input(s): CHOL, HDL, LDLCALC, TRIG, CHOLHDL, LDLDIRECT in the last 72 hours. Thyroid Function Tests: No results for input(s): TSH, T4TOTAL, FREET4, T3FREE, THYROIDAB in the last 72 hours. Anemia Panel: No results for input(s): VITAMINB12, FOLATE, FERRITIN, TIBC, IRON, RETICCTPCT in the last 72 hours. Urine analysis:    Component Value Date/Time    COLORURINE YELLOW 02/21/2016 2116   APPEARANCEUR CLEAR 02/21/2016 2116   LABSPEC 1.035 (H) 02/21/2016 2116   PHURINE 6.0 02/21/2016 2116   GLUCOSEU >1000 (A) 02/21/2016 2116   HGBUR MODERATE (A) 02/21/2016 2116   BILIRUBINUR NEGATIVE 02/21/2016 2116   KETONESUR 15 (A) 02/21/2016 2116   PROTEINUR NEGATIVE 02/21/2016 2116   UROBILINOGEN 0.2 05/14/2015 1813   NITRITE NEGATIVE 02/21/2016 2116   LEUKOCYTESUR NEGATIVE 02/21/2016 2116   Sepsis Labs: @LABRCNTIP (procalcitonin:4,lacticidven:4)  ) Recent Results (from the past 240 hour(s))  Culture, blood (Routine x 2)     Status: None (Preliminary result)   Collection Time: 02/21/16  8:50 PM  Result Value Ref Range Status   Specimen Description BLOOD RIGHT WRIST  Final   Special Requests BOTTLES DRAWN AEROBIC AND ANAEROBIC 5CC EA  Final   Culture  Setup Time   Final    GRAM NEGATIVE RODS IN BOTH AEROBIC AND ANAEROBIC BOTTLES CRITICAL RESULT CALLED TO, READ BACK BY AND VERIFIED WITH: T STONE,PHARMD AT 1000 02/22/16 BY M WILSON    Culture GRAM NEGATIVE RODS  Final   Report Status PENDING  Incomplete  Blood Culture ID Panel (Reflexed)     Status: Abnormal   Collection Time: 02/21/16  8:50 PM  Result Value Ref Range Status   Enterococcus species NOT DETECTED NOT DETECTED Final   Vancomycin resistance NOT DETECTED NOT DETECTED Final   Listeria monocytogenes NOT DETECTED NOT DETECTED Final   Staphylococcus species NOT DETECTED NOT DETECTED Final   Staphylococcus aureus NOT DETECTED NOT DETECTED Final   Methicillin resistance NOT DETECTED NOT DETECTED Final   Streptococcus species NOT DETECTED NOT DETECTED Final   Streptococcus agalactiae NOT DETECTED NOT DETECTED Final   Streptococcus pneumoniae NOT DETECTED NOT DETECTED Final   Streptococcus pyogenes NOT DETECTED NOT DETECTED Final   Acinetobacter baumannii NOT DETECTED NOT DETECTED Final   Enterobacteriaceae species DETECTED (A) NOT DETECTED Final    Comment: CRITICAL RESULT CALLED TO,  READ BACK BY AND VERIFIED WITH: T. Stone Pharm.D. 10:00 02/22/16  ([email or web address])    Enterobacter cloacae complex NOT DETECTED NOT DETECTED Final   Escherichia coli NOT DETECTED NOT DETECTED Final   Klebsiella oxytoca NOT DETECTED NOT DETECTED Final   Klebsiella pneumoniae DETECTED (A) NOT DETECTED Final    Comment: CRITICAL RESULT CALLED TO, READ BACK BY AND VERIFIED WITH: T. Stone Pharm.D. 10:00 02/22/16  ([email or web address])    Proteus species NOT DETECTED NOT DETECTED Final   Serratia marcescens NOT DETECTED NOT DETECTED Final   Carbapenem resistance NOT DETECTED NOT DETECTED Final   Haemophilus influenzae NOT DETECTED NOT DETECTED Final   Neisseria meningitidis NOT DETECTED NOT DETECTED Final  Pseudomonas aeruginosa NOT DETECTED NOT DETECTED Final   Candida albicans NOT DETECTED NOT DETECTED Final   Candida glabrata NOT DETECTED NOT DETECTED Final   Candida krusei NOT DETECTED NOT DETECTED Final   Candida parapsilosis NOT DETECTED NOT DETECTED Final   Candida tropicalis NOT DETECTED NOT DETECTED Final  Culture, blood (Routine x 2)     Status: None (Preliminary result)   Collection Time: 02/21/16  8:55 PM  Result Value Ref Range Status   Specimen Description BLOOD LEFT ANTECUBITAL  Final   Special Requests IN PEDIATRIC BOTTLE 1CC  Final   Culture  Setup Time   Final    GRAM NEGATIVE RODS IN PEDIATRIC BOTTLE CRITICAL RESULT CALLED TO, READ BACK BY AND VERIFIED WITH: T STONE,PHARMD AT 1000 02/22/16 BY M WILSON    Culture GRAM NEGATIVE RODS  Final   Report Status PENDING  Incomplete  MRSA PCR Screening     Status: None   Collection Time: 02/22/16 12:01 AM  Result Value Ref Range Status   MRSA by PCR NEGATIVE NEGATIVE Final    Comment:        The GeneXpert MRSA Assay (FDA approved for NASAL specimens only), is one component of a comprehensive MRSA colonization surveillance program. It is not intended to diagnose MRSA infection nor to guide or monitor treatment for MRSA infections.        Studies: Dg Chest Port 1 View  Result Date: 02/21/2016 CLINICAL DATA:  Acute onset of altered mental status. Confusion. Hyperglycemia. EXAM: PORTABLE CHEST 1 VIEW COMPARISON:  Chest radiograph performed 12/17/2015 FINDINGS: The lungs are hypoexpanded. Vascular congestion is noted. Increased interstitial markings may reflect mild interstitial edema. No pleural effusion or pneumothorax is seen. The cardiomediastinal silhouette is borderline normal in size. No acute osseous abnormalities are identified. There is mild chronic deformity of the left mid clavicle. IMPRESSION: Lungs hypoexpanded. Vascular congestion noted. Increased interstitial markings may reflect mild interstitial edema. Electronically Signed   By: Garald Balding M.D.   On: 02/21/2016 21:32    Scheduled Meds: . aspirin  81 mg Oral Daily  . atorvastatin  10 mg Oral q1800  . insulin aspart  0-5 Units Subcutaneous QHS  . insulin aspart  0-9 Units Subcutaneous TID WC  . lactulose  30 g Oral TID  . multivitamin with minerals  1 tablet Oral Daily  . pantoprazole  40 mg Oral Daily  . piperacillin-tazobactam (ZOSYN)  IV  3.375 g Intravenous Q8H  . rifaximin  550 mg Oral BID  . sodium chloride  1,000 mL Intravenous Once    Continuous Infusions:    LOS: 1 day   Time spent: 32 minutes  Samarth Ogle Marry Guan, MD [facility] Pager [phone]  If 7PM-7AM, please contact night-coverage [email or web address] Password [email or web address] 02/22/2016, 1:04 PM

## 2016-02-22 NOTE — Progress Notes (Signed)
CRITICAL VALUE ALERT  Critical value received:  La 2.9  Date of notification:  02/22/16  Time of notification:  1100  Critical value read back:Yes.    Nurse who received alert:  Teressa Lower  MD notified (1st page):  Edwena Bunde  Time of first page:  60  MD notified (2nd page):  Time of second page:  Responding MD:    Time MD responded:  [id number] see orders

## 2016-02-22 NOTE — Progress Notes (Signed)
Pharmacy Antibiotic Note  Adam Butler is a 60 y.o. male admitted on 02/21/2016 with sepsis.  Pharmacy has been consulted for Vancomycin and Zosyn dosing.  Vanc 2gm IV and Zosyn 3.375gm IV ~2200  Plan: Zosyn 3.375gm IV q8h - doses over 4 hours Vancomycin 1234m IV q12h Will f/u micro data, renal function, and pt's clinical condition Vanc trough at Css in obese pt   Height: 5' 7"  (170.2 cm) Weight: 270 lb (122.5 kg) IBW/kg (Calculated) : 66.1  Temp (24hrs), Avg:101.3 F (38.5 C), Min:99.4 F (37.4 C), Max:103.2 F (39.6 C)   Recent Labs Lab 02/21/16 2050 02/21/16 2106  WBC 8.4  --   CREATININE 0.84  --   LATICACIDVEN  --  2.63*    Estimated Creatinine Clearance: 118.8 mL/min (by C-G formula based on SCr of 0.84 mg/dL).    Allergies  Allergen Reactions  . Isosorbide Nitrate Other (See Comments)    Nose bleeds    Antimicrobials this admission: 8/13 Zosyn >> 8/13 Vanc >>   Dose adjustments this admission: n/a  Microbiology results: 8/13 BCx x2:  8/13 UCx:   8/14 MRSA PCR:   Thank you for allowing pharmacy to be a part of this patient's care.  CSherlon Handing PharmD, BCPS Clinical pharmacist, pager [phone]/14/2017 1:10 AM

## 2016-02-23 ENCOUNTER — Inpatient Hospital Stay (HOSPITAL_COMMUNITY): Payer: PPO

## 2016-02-23 DIAGNOSIS — E119 Type 2 diabetes mellitus without complications: Secondary | ICD-10-CM | POA: Diagnosis not present

## 2016-02-23 DIAGNOSIS — K7290 Hepatic failure, unspecified without coma: Secondary | ICD-10-CM | POA: Diagnosis not present

## 2016-02-23 DIAGNOSIS — R6520 Severe sepsis without septic shock: Secondary | ICD-10-CM | POA: Diagnosis not present

## 2016-02-23 DIAGNOSIS — K7581 Nonalcoholic steatohepatitis (NASH): Secondary | ICD-10-CM | POA: Diagnosis not present

## 2016-02-23 DIAGNOSIS — A419 Sepsis, unspecified organism: Secondary | ICD-10-CM | POA: Diagnosis not present

## 2016-02-23 DIAGNOSIS — I81 Portal vein thrombosis: Secondary | ICD-10-CM

## 2016-02-23 DIAGNOSIS — R7881 Bacteremia: Secondary | ICD-10-CM | POA: Diagnosis not present

## 2016-02-23 DIAGNOSIS — B961 Klebsiella pneumoniae [K. pneumoniae] as the cause of diseases classified elsewhere: Secondary | ICD-10-CM | POA: Diagnosis not present

## 2016-02-23 DIAGNOSIS — K7460 Unspecified cirrhosis of liver: Secondary | ICD-10-CM | POA: Diagnosis not present

## 2016-02-23 LAB — CBC
HEMATOCRIT: 31.9 % — AB (ref 39.0–52.0)
HEMOGLOBIN: 10.4 g/dL — AB (ref 13.0–17.0)
MCH: 33.4 pg (ref 26.0–34.0)
MCHC: 32.6 g/dL (ref 30.0–36.0)
MCV: 102.6 fL — AB (ref 78.0–100.0)
Platelets: 41 10*3/uL — ABNORMAL LOW (ref 150–400)
RBC: 3.11 MIL/uL — AB (ref 4.22–5.81)
RDW: 14.8 % (ref 11.5–15.5)
WBC: 4.2 10*3/uL (ref 4.0–10.5)

## 2016-02-23 LAB — BASIC METABOLIC PANEL
Anion gap: 5 (ref 5–15)
BUN: 8 mg/dL (ref 6–20)
CHLORIDE: 108 mmol/L (ref 101–111)
CO2: 22 mmol/L (ref 22–32)
CREATININE: 0.8 mg/dL (ref 0.61–1.24)
Calcium: 8.1 mg/dL — ABNORMAL LOW (ref 8.9–10.3)
GFR calc non Af Amer: >60 mL/min (ref >60)
Glucose, Bld: 223 mg/dL — ABNORMAL HIGH (ref 65–99)
Potassium: 3.7 mmol/L (ref 3.5–5.1)
Sodium: 135 mmol/L (ref 135–145)

## 2016-02-23 LAB — URINE CULTURE

## 2016-02-23 LAB — LACTIC ACID, PLASMA: LACTIC ACID, VENOUS: 2.3 mmol/L — AB (ref 0.5–1.9)

## 2016-02-23 LAB — GLUCOSE, CAPILLARY
GLUCOSE-CAPILLARY: 212 mg/dL — AB (ref 65–99)
Glucose-Capillary: 203 mg/dL — ABNORMAL HIGH (ref 65–99)
Glucose-Capillary: 328 mg/dL — ABNORMAL HIGH (ref 65–99)
Glucose-Capillary: 154 mg/dL — ABNORMAL HIGH (ref 65–99)

## 2016-02-23 LAB — HIV ANTIBODY (ROUTINE TESTING W REFLEX): HIV Screen 4th Generation wRfx: NONREACTIVE

## 2016-02-23 NOTE — Progress Notes (Signed)
Subjective:  Patient wants to leave and go home. I explained to him that we needed to at least have sensitivities on his organism and would have that likely the next day if not today. I also had ordered MRI of his abdomen given the question of possible portal vein thrombosis   Antibiotics:  Anti-infectives    Start     Dose/Rate Route Frequency Ordered Stop   02/22/16 1700  cefTRIAXone (ROCEPHIN) 2 g in dextrose 5 % 50 mL IVPB     2 g 100 mL/hr over 30 Minutes Intravenous Every 24 hours 02/22/16 1511     02/22/16 1000  vancomycin (VANCOCIN) 1,250 mg in sodium chloride 0.9 % 250 mL IVPB  Status:  Discontinued     1,250 mg 166.7 mL/hr over 90 Minutes Intravenous Every 12 hours 02/22/16 0117 02/22/16 1016   02/22/16 0600  piperacillin-tazobactam (ZOSYN) IVPB 3.375 g  Status:  Discontinued     3.375 g 12.5 mL/hr over 240 Minutes Intravenous Every 8 hours 02/22/16 0117 02/22/16 1511   02/21/16 2230  rifaximin (XIFAXAN) tablet 550 mg     550 mg Oral 2 times daily 02/21/16 2223     02/21/16 2145  piperacillin-tazobactam (ZOSYN) IVPB 3.375 g     3.375 g 100 mL/hr over 30 Minutes Intravenous  Once 02/21/16 2130 02/21/16 2320   02/21/16 2145  vancomycin (VANCOCIN) IVPB 1000 mg/200 mL premix  Status:  Discontinued     1,000 mg 200 mL/hr over 60 Minutes Intravenous  Once 02/21/16 2130 02/21/16 2136   02/21/16 2145  vancomycin (VANCOCIN) 2,000 mg in sodium chloride 0.9 % 500 mL IVPB     2,000 mg 250 mL/hr over 120 Minutes Intravenous STAT 02/21/16 2136 02/22/16 0015      Medications: Scheduled Meds: . aspirin  81 mg Oral Daily  . atorvastatin  10 mg Oral q1800  . cefTRIAXone (ROCEPHIN)  IV  2 g Intravenous Q24H  . insulin aspart  0-5 Units Subcutaneous QHS  . insulin aspart  0-9 Units Subcutaneous TID WC  . lactulose  30 g Oral TID  . multivitamin with minerals  1 tablet Oral Daily  . pantoprazole  40 mg Oral Daily  . rifaximin  550 mg Oral BID   Continuous Infusions:    PRN Meds:.ibuprofen    Objective: Weight change: 22 lb 5.3 oz (10.1 kg)  Intake/Output Summary (Last 24 hours) at 02/23/16 1239 Last data filed at 02/23/16 1037  Gross per 24 hour  Intake          1984.33 ml  Output                0 ml  Net          1984.33 ml   Blood pressure (!) 120/44, pulse 72, temperature 98.3 F (36.8 C), temperature source Oral, resp. rate 18, height 5' 7"  (1.702 m), weight 292 lb 5.3 oz (132.6 kg), SpO2 96 %. Temp:  [97.4 F (36.3 C)-98.3 F (36.8 C)] 98.3 F (36.8 C) (08/15 0744) Pulse Rate:  [66-82] 72 (08/15 0744) Resp:  [14-40] 18 (08/15 0744) BP: (99-175)/(44-150) 120/44 (08/15 0744) SpO2:  [93 %-100 %] 96 % (08/15 0744) Weight:  [292 lb 5.3 oz (132.6 kg)] 292 lb 5.3 oz (132.6 kg) (08/14 1900)  Physical Exam: General: Alert and awake, oriented but belligerent a bit HEENT: anicteric sclera, pupils reactive to light and accommodation, EOMI CVS regular rate, normal r,  no murmur rubs or  gallops Chest: clear to auscultation bilaterally, no wheezing, rales or rhonchi Abdomen: Distended nontender  Extremities: 2+ edema  Neuro: nonfocal  CBC:  CBC Latest Ref Rng & Units 02/23/2016 02/22/2016 02/21/2016  WBC 4.0 - 10.5 K/uL 4.2 7.5 8.4  Hemoglobin 13.0 - 17.0 g/dL 10.4(L) 10.6(L) 11.9(L)  Hematocrit 39.0 - 52.0 % 31.9(L) 31.8(L) 35.5(L)  Platelets 150 - 400 K/uL 41(L) 42(L) 55(L)      BMET  Recent Labs  02/22/16 0415 02/23/16 0549  NA 135 135  K 3.6 3.7  CL 105 108  CO2 23 22  GLUCOSE 148* 223*  BUN 5* 8  CREATININE 0.76 0.80  CALCIUM 7.9* 8.1*     Liver Panel   Recent Labs  02/21/16 2050  PROT 6.8  ALBUMIN 2.8*  AST 79*  ALT 47  ALKPHOS 231*  BILITOT 2.1*       Sedimentation Rate No results for input(s): ESRSEDRATE in the last 72 hours. C-Reactive Protein No results for input(s): CRP in the last 72 hours.  Micro Results: Recent Results (from the past 720 hour(s))  Culture, blood (Routine x 2)     Status: None  (Preliminary result)   Collection Time: 02/21/16  8:50 PM  Result Value Ref Range Status   Specimen Description BLOOD RIGHT WRIST  Final   Special Requests BOTTLES DRAWN AEROBIC AND ANAEROBIC 5CC EA  Final   Culture  Setup Time   Final    GRAM NEGATIVE RODS IN BOTH AEROBIC AND ANAEROBIC BOTTLES CRITICAL RESULT CALLED TO, READ BACK BY AND VERIFIED WITH: T STONE,PHARMD AT 1000 02/22/16 BY M WILSON    Culture GRAM NEGATIVE RODS  Final   Report Status PENDING  Incomplete  Blood Culture ID Panel (Reflexed)     Status: Abnormal   Collection Time: 02/21/16  8:50 PM  Result Value Ref Range Status   Enterococcus species NOT DETECTED NOT DETECTED Final   Vancomycin resistance NOT DETECTED NOT DETECTED Final   Listeria monocytogenes NOT DETECTED NOT DETECTED Final   Staphylococcus species NOT DETECTED NOT DETECTED Final   Staphylococcus aureus NOT DETECTED NOT DETECTED Final   Methicillin resistance NOT DETECTED NOT DETECTED Final   Streptococcus species NOT DETECTED NOT DETECTED Final   Streptococcus agalactiae NOT DETECTED NOT DETECTED Final   Streptococcus pneumoniae NOT DETECTED NOT DETECTED Final   Streptococcus pyogenes NOT DETECTED NOT DETECTED Final   Acinetobacter baumannii NOT DETECTED NOT DETECTED Final   Enterobacteriaceae species DETECTED (A) NOT DETECTED Final    Comment: CRITICAL RESULT CALLED TO, READ BACK BY AND VERIFIED WITH: T. Stone Pharm.D. 10:00 02/22/16  ([email or web address])    Enterobacter cloacae complex NOT DETECTED NOT DETECTED Final   Escherichia coli NOT DETECTED NOT DETECTED Final   Klebsiella oxytoca NOT DETECTED NOT DETECTED Final   Klebsiella pneumoniae DETECTED (A) NOT DETECTED Final    Comment: CRITICAL RESULT CALLED TO, READ BACK BY AND VERIFIED WITH: T. Stone Pharm.D. 10:00 02/22/16  ([email or web address])    Proteus species NOT DETECTED NOT DETECTED Final   Serratia marcescens NOT DETECTED NOT DETECTED Final   Carbapenem resistance NOT DETECTED NOT DETECTED Final   Haemophilus  influenzae NOT DETECTED NOT DETECTED Final   Neisseria meningitidis NOT DETECTED NOT DETECTED Final   Pseudomonas aeruginosa NOT DETECTED NOT DETECTED Final   Candida albicans NOT DETECTED NOT DETECTED Final   Candida glabrata NOT DETECTED NOT DETECTED Final   Candida krusei NOT DETECTED NOT DETECTED Final   Candida parapsilosis NOT DETECTED NOT DETECTED Final  Candida tropicalis NOT DETECTED NOT DETECTED Final  Culture, blood (Routine x 2)     Status: None (Preliminary result)   Collection Time: 02/21/16  8:55 PM  Result Value Ref Range Status   Specimen Description BLOOD LEFT ANTECUBITAL  Final   Special Requests IN PEDIATRIC BOTTLE 1CC  Final   Culture  Setup Time   Final    GRAM NEGATIVE RODS IN PEDIATRIC BOTTLE CRITICAL RESULT CALLED TO, READ BACK BY AND VERIFIED WITH: T STONE,PHARMD AT 1000 02/22/16 BY M WILSON    Culture GRAM NEGATIVE RODS  Final   Report Status PENDING  Incomplete  Urine culture     Status: Abnormal   Collection Time: 02/21/16  9:16 PM  Result Value Ref Range Status   Specimen Description URINE, RANDOM  Final   Special Requests NONE  Final   Culture <10,000 COLONIES/mL INSIGNIFICANT GROWTH (A)  Final   Report Status 02/23/2016 FINAL  Final  MRSA PCR Screening     Status: None   Collection Time: 02/22/16 12:01 AM  Result Value Ref Range Status   MRSA by PCR NEGATIVE NEGATIVE Final    Comment:        The GeneXpert MRSA Assay (FDA approved for NASAL specimens only), is one component of a comprehensive MRSA colonization surveillance program. It is not intended to diagnose MRSA infection nor to guide or monitor treatment for MRSA infections.     Studies/Results: Ct Abdomen Pelvis W Contrast  Result Date: 02/22/2016 CLINICAL DATA:  Bacteremia.  Hepatic cirrhosis. EXAM: CT ABDOMEN AND PELVIS WITH CONTRAST TECHNIQUE: Multidetector CT imaging of the abdomen and pelvis was performed using the standard protocol following bolus administration of intravenous  contrast. CONTRAST:  100 ml ISOVUE-300 IOPAMIDOL (ISOVUE-300) INJECTION 61% COMPARISON:  Abdominal ultrasound 12/04/2015, CT abdomen/ pelvis 04/14/2015 FINDINGS: Lower chest:  No pulmonary nodules or masses.  No pleural effusion. Hepatobiliary: Diffusely nodular hepatic contours compatible with cirrhosis. No focal liver lesion identified. Small volume perihepatic ascites. Cholelithiasis without evidence of acute cholecystitis. No visualized biliary dilatation. Pancreas: Normal pancreatic enhancement without mass lesion or calcification. No peripancreatic fluid collection. Spleen: The spleen is enlarged, measuring 15.2 cm in craniocaudal dimension and 17.4 cm and AP dimension. Adrenals/Urinary Tract: Sub cm right adrenal nodule is unchanged. Left adrenal gland is normal. Small left upper pole renal calculus. No hydronephrosis. Sub cm hypodensity of the right kidney is too small to characterize accurately. Mild bilateral perinephric stranding. Stomach/Bowel: Stomach is decompressed. There is rectosigmoid diverticulosis. No dilated small bowel. There is a small amount of free fluid within the pelvis and right lower quadrant abdomen. Vascular/Lymphatic: There are prominent perisplenic, paraesophageal and gastrohepatic varices. There is a recannulized umbilical vein. There is calcification within the extrahepatic main portal vein and superior mesenteric vein. There is poor contrast opacification of the portal vein. No abdominal or pelvic lymphadenopathy by CT size criteria. Reproductive: Prostate and seminal vesicles are normal. Other: Prominent varices are noted within the subcutaneous fat of the anterior abdomen. Musculoskeletal: There is multilevel facet hypertrophy. Multiple thoracic osteophytes. No lytic or blastic osseous lesions. IMPRESSION: 1. Findings of hepatic cirrhosis advanced portal hypertension, including splenomegaly and paraesophageal, gastrohepatic and perisplenic varices. 2. Recannulized umbilical vein  and prominent anterior abdominal wall subcutaneous varices. 3. Small volume lower abdominal and pelvic free fluid without evidence of abscess formation. 4. Poor opacification of the portal vein and superior mesenteric vein may be secondary to bolus timing, but the thrombus is not excluded. This could be further evaluated with abdominal  MRI or dedicated hepatic Doppler ultrasound, if clinically indicated. 5. Cholelithiasis without evidence of acute cholecystitis. 6. Sigmoid diverticulosis without acute diverticulitis. Electronically Signed   By: Ulyses Jarred M.D.   On: 02/22/2016 21:13   Dg Chest Port 1 View  Result Date: 02/21/2016 CLINICAL DATA:  Acute onset of altered mental status. Confusion. Hyperglycemia. EXAM: PORTABLE CHEST 1 VIEW COMPARISON:  Chest radiograph performed 12/17/2015 FINDINGS: The lungs are hypoexpanded. Vascular congestion is noted. Increased interstitial markings may reflect mild interstitial edema. No pleural effusion or pneumothorax is seen. The cardiomediastinal silhouette is borderline normal in size. No acute osseous abnormalities are identified. There is mild chronic deformity of the left mid clavicle. IMPRESSION: Lungs hypoexpanded. Vascular congestion noted. Increased interstitial markings may reflect mild interstitial edema. Electronically Signed   By: Garald Balding M.D.   On: 02/21/2016 21:32      Assessment/Plan:  INTERVAL HISTORY: CT without source for his infection ID'd     Principal Problem:   Severe sepsis ([place]) Active Problems:   Liver cirrhosis secondary to NASH   Type 2 diabetes mellitus ([place])   Bacteremia    Adam Butler is a 60 y.o. male with  cryptogenic cirrhosis and bacteremia from klebsiella  #1 Klebsiella pneumoniae bacteremia:  I would be okay to discharge him with oral antibiotics if I knew what the sensitivities were to his organism.  #2 portal vein thrombus possibly seen on CT scan I ordered an MRI of the abdomen to investigate  this.  #3 disposition: Patient wants to leave today apparently and hospitalist is worried he may be leaving against medical device. In the chart there is listed that the patient has a guardian so I don't know if he can legally leave [place]  I  LOS: 2 days   Alcide Evener 02/23/2016, 12:39 PM

## 2016-02-23 NOTE — Progress Notes (Signed)
PROGRESS NOTE  Adam Butler FVC:[id number] DOB: Apr 22, 1956 DOA: 02/21/2016 PCP: [name], NP  HPI/Recap of past 24 hours: Adam Butler is a 60 y.o. male with medical history significant of NASH cirrhosis.  Patient presents to the ED with c/o acute onset of shaking chills, fever to 103 on the evening of admission.  He was feeling fine earlier, went to meet spouse at restaurant when they noticed shaking chills.  Klebsiella PNA in 2/2 blood culture bottles, not on Rocephin per ID consult with final culture data and repeat blood cultures pending.  He is very upset this AM and wants to go home. Denies chest pain, cough, diarrhea, abd pain. He just does not want to be here.  Assessment/Plan: Principal Problem:   Sepsis ([place]) - due to bacteremia, now resolved. Cont treatment as below Active Problems:   Bacteremia - k pneumonia, unclear source, CT abd w/o abscess. - await blood and urine cx - repeat blood cx P as well - TTE - ID consult: on Rocephin   Liver cirrhosis secondary to NASH - cont rifaximin and appropriate meds   Type 2 diabetes mellitus ([place])  I explained to him that we need to await final sensitivities before we can discharge him from the hospital. Dr. Tommy Medal will see him later today as well. He is at risk for leaving AMA.  Code Status: FULL   Family Communication: None    Disposition Plan: To home when ready.   Consultants:  ID   Procedures:  None   Antimicrobials:  Vanco 8/13-8/14  Zosyn 8/13-->    Objective: Vitals:   02/22/16 1900 02/22/16 2056 02/23/16 0618 02/23/16 0744  BP:  (!) 102/58 (!) 175/150 (!) 120/44  Pulse:  75 82 72  Resp:  19 17 18   Temp:  98.1 F (36.7 C) 97.4 F (36.3 C) 98.3 F (36.8 C)  TempSrc:   Oral Oral  SpO2:  100% 93% 96%  Weight: 132.6 kg (292 lb 5.3 oz)     Height: 5' 7"  (1.702 m)       Intake/Output Summary (Last 24 hours) at 02/23/16 0857 Last data filed at 02/23/16 0300  Gross per 24 hour  Intake           2569.33 ml  Output                0 ml  Net          2569.33 ml   Filed Weights   02/21/16 2059 02/22/16 1900  Weight: 122.5 kg (270 lb) 132.6 kg (292 lb 5.3 oz)    Exam: General:  Alert, oriented, upset, in no acute distress Eyes: pupils round and reactive to light and accomodation, clear sclerea Neck: supple, no masses, trachea mildline  Cardiovascular: RRR, no murmurs or rubs, no peripheral edema  Respiratory: clear to auscultation bilaterally, no wheezes, no crackles  Abdomen: soft, nontender, nondistended, normal bowel tones heard  Skin: dry, no rashes  Musculoskeletal: no joint effusions, normal range of motion  Psychiatric: appropriate affect, normal speech  Neurologic: extraocular muscles intact, clear speech, moving all extremities with intact sensorium   Data Reviewed: CBC:  Recent Labs Lab 02/21/16 2050 02/22/16 0415 02/23/16 0549  WBC 8.4 7.5 4.2  NEUTROABS 6.8  --   --   HGB 11.9* 10.6* 10.4*  HCT 35.5* 31.8* 31.9*  MCV 99.4 99.4 102.6*  PLT 55* 42* 41*   Basic Metabolic Panel:  Recent Labs Lab 02/21/16 2050 02/22/16 0415 02/23/16 0549  NA 130* 135 135  K 3.7 3.6 3.7  CL 100* 105 108  CO2 21* 23 22  GLUCOSE 343* 148* 223*  BUN <5* 5* 8  CREATININE 0.84 0.76 0.80  CALCIUM 8.5* 7.9* 8.1*   GFR: Estimated Creatinine Clearance: 130.4 mL/min (by C-G formula based on SCr of 0.8 mg/dL). Liver Function Tests:  Recent Labs Lab 02/21/16 2050  AST 79*  ALT 47  ALKPHOS 231*  BILITOT 2.1*  PROT 6.8  ALBUMIN 2.8*   No results for input(s): LIPASE, AMYLASE in the last 168 hours.  Recent Labs Lab 02/21/16 2050  AMMONIA 88*   Coagulation Profile: No results for input(s): INR, PROTIME in the last 168 hours. Cardiac Enzymes: No results for input(s): CKTOTAL, CKMB, CKMBINDEX, TROPONINI in the last 168 hours. BNP (last 3 results) No results for input(s): PROBNP in the last 8760 hours. HbA1C: No results for input(s): HGBA1C in the last 72  hours. CBG:  Recent Labs Lab 02/22/16 0827 02/22/16 1136 02/22/16 1743 02/22/16 2059 02/23/16 0745  GLUCAP 181* 329* 253* 265* 203*   Lipid Profile: No results for input(s): CHOL, HDL, LDLCALC, TRIG, CHOLHDL, LDLDIRECT in the last 72 hours. Thyroid Function Tests: No results for input(s): TSH, T4TOTAL, FREET4, T3FREE, THYROIDAB in the last 72 hours. Anemia Panel: No results for input(s): VITAMINB12, FOLATE, FERRITIN, TIBC, IRON, RETICCTPCT in the last 72 hours. Urine analysis:    Component Value Date/Time   COLORURINE YELLOW 02/21/2016 2116   APPEARANCEUR CLEAR 02/21/2016 2116   LABSPEC 1.035 (H) 02/21/2016 2116   PHURINE 6.0 02/21/2016 2116   GLUCOSEU >1000 (A) 02/21/2016 2116   HGBUR MODERATE (A) 02/21/2016 2116   BILIRUBINUR NEGATIVE 02/21/2016 2116   KETONESUR 15 (A) 02/21/2016 2116   PROTEINUR NEGATIVE 02/21/2016 2116   UROBILINOGEN 0.2 05/14/2015 1813   NITRITE NEGATIVE 02/21/2016 2116   LEUKOCYTESUR NEGATIVE 02/21/2016 2116   Sepsis Labs: @LABRCNTIP (procalcitonin:4,lacticidven:4)  ) Recent Results (from the past 240 hour(s))  Culture, blood (Routine x 2)     Status: None (Preliminary result)   Collection Time: 02/21/16  8:50 PM  Result Value Ref Range Status   Specimen Description BLOOD RIGHT WRIST  Final   Special Requests BOTTLES DRAWN AEROBIC AND ANAEROBIC 5CC EA  Final   Culture  Setup Time   Final    GRAM NEGATIVE RODS IN BOTH AEROBIC AND ANAEROBIC BOTTLES CRITICAL RESULT CALLED TO, READ BACK BY AND VERIFIED WITH: T STONE,PHARMD AT 1000 02/22/16 BY M WILSON    Culture GRAM NEGATIVE RODS  Final   Report Status PENDING  Incomplete  Blood Culture ID Panel (Reflexed)     Status: Abnormal   Collection Time: 02/21/16  8:50 PM  Result Value Ref Range Status   Enterococcus species NOT DETECTED NOT DETECTED Final   Vancomycin resistance NOT DETECTED NOT DETECTED Final   Listeria monocytogenes NOT DETECTED NOT DETECTED Final   Staphylococcus species NOT  DETECTED NOT DETECTED Final   Staphylococcus aureus NOT DETECTED NOT DETECTED Final   Methicillin resistance NOT DETECTED NOT DETECTED Final   Streptococcus species NOT DETECTED NOT DETECTED Final   Streptococcus agalactiae NOT DETECTED NOT DETECTED Final   Streptococcus pneumoniae NOT DETECTED NOT DETECTED Final   Streptococcus pyogenes NOT DETECTED NOT DETECTED Final   Acinetobacter baumannii NOT DETECTED NOT DETECTED Final   Enterobacteriaceae species DETECTED (A) NOT DETECTED Final    Comment: CRITICAL RESULT CALLED TO, READ BACK BY AND VERIFIED WITH: T. Stone Pharm.D. 10:00 02/22/16  ([email or web address])    Enterobacter cloacae  complex NOT DETECTED NOT DETECTED Final   Escherichia coli NOT DETECTED NOT DETECTED Final   Klebsiella oxytoca NOT DETECTED NOT DETECTED Final   Klebsiella pneumoniae DETECTED (A) NOT DETECTED Final    Comment: CRITICAL RESULT CALLED TO, READ BACK BY AND VERIFIED WITH: T. Stone Pharm.D. 10:00 02/22/16  ([email or web address])    Proteus species NOT DETECTED NOT DETECTED Final   Serratia marcescens NOT DETECTED NOT DETECTED Final   Carbapenem resistance NOT DETECTED NOT DETECTED Final   Haemophilus influenzae NOT DETECTED NOT DETECTED Final   Neisseria meningitidis NOT DETECTED NOT DETECTED Final   Pseudomonas aeruginosa NOT DETECTED NOT DETECTED Final   Candida albicans NOT DETECTED NOT DETECTED Final   Candida glabrata NOT DETECTED NOT DETECTED Final   Candida krusei NOT DETECTED NOT DETECTED Final   Candida parapsilosis NOT DETECTED NOT DETECTED Final   Candida tropicalis NOT DETECTED NOT DETECTED Final  Culture, blood (Routine x 2)     Status: None (Preliminary result)   Collection Time: 02/21/16  8:55 PM  Result Value Ref Range Status   Specimen Description BLOOD LEFT ANTECUBITAL  Final   Special Requests IN PEDIATRIC BOTTLE 1CC  Final   Culture  Setup Time   Final    GRAM NEGATIVE RODS IN PEDIATRIC BOTTLE CRITICAL RESULT CALLED TO, READ BACK BY AND VERIFIED WITH: T  STONE,PHARMD AT 1000 02/22/16 BY M WILSON    Culture GRAM NEGATIVE RODS  Final   Report Status PENDING  Incomplete  MRSA PCR Screening     Status: None   Collection Time: 02/22/16 12:01 AM  Result Value Ref Range Status   MRSA by PCR NEGATIVE NEGATIVE Final    Comment:        The GeneXpert MRSA Assay (FDA approved for NASAL specimens only), is one component of a comprehensive MRSA colonization surveillance program. It is not intended to diagnose MRSA infection nor to guide or monitor treatment for MRSA infections.       Studies: Ct Abdomen Pelvis W Contrast  Result Date: 02/22/2016 CLINICAL DATA:  Bacteremia.  Hepatic cirrhosis. EXAM: CT ABDOMEN AND PELVIS WITH CONTRAST TECHNIQUE: Multidetector CT imaging of the abdomen and pelvis was performed using the standard protocol following bolus administration of intravenous contrast. CONTRAST:  100 ml ISOVUE-300 IOPAMIDOL (ISOVUE-300) INJECTION 61% COMPARISON:  Abdominal ultrasound 12/04/2015, CT abdomen/ pelvis 04/14/2015 FINDINGS: Lower chest:  No pulmonary nodules or masses.  No pleural effusion. Hepatobiliary: Diffusely nodular hepatic contours compatible with cirrhosis. No focal liver lesion identified. Small volume perihepatic ascites. Cholelithiasis without evidence of acute cholecystitis. No visualized biliary dilatation. Pancreas: Normal pancreatic enhancement without mass lesion or calcification. No peripancreatic fluid collection. Spleen: The spleen is enlarged, measuring 15.2 cm in craniocaudal dimension and 17.4 cm and AP dimension. Adrenals/Urinary Tract: Sub cm right adrenal nodule is unchanged. Left adrenal gland is normal. Small left upper pole renal calculus. No hydronephrosis. Sub cm hypodensity of the right kidney is too small to characterize accurately. Mild bilateral perinephric stranding. Stomach/Bowel: Stomach is decompressed. There is rectosigmoid diverticulosis. No dilated small bowel. There is a small amount of free fluid  within the pelvis and right lower quadrant abdomen. Vascular/Lymphatic: There are prominent perisplenic, paraesophageal and gastrohepatic varices. There is a recannulized umbilical vein. There is calcification within the extrahepatic main portal vein and superior mesenteric vein. There is poor contrast opacification of the portal vein. No abdominal or pelvic lymphadenopathy by CT size criteria. Reproductive: Prostate and seminal vesicles are normal. Other: Prominent varices are noted within the  subcutaneous fat of the anterior abdomen. Musculoskeletal: There is multilevel facet hypertrophy. Multiple thoracic osteophytes. No lytic or blastic osseous lesions. IMPRESSION: 1. Findings of hepatic cirrhosis advanced portal hypertension, including splenomegaly and paraesophageal, gastrohepatic and perisplenic varices. 2. Recannulized umbilical vein and prominent anterior abdominal wall subcutaneous varices. 3. Small volume lower abdominal and pelvic free fluid without evidence of abscess formation. 4. Poor opacification of the portal vein and superior mesenteric vein may be secondary to bolus timing, but the thrombus is not excluded. This could be further evaluated with abdominal MRI or dedicated hepatic Doppler ultrasound, if clinically indicated. 5. Cholelithiasis without evidence of acute cholecystitis. 6. Sigmoid diverticulosis without acute diverticulitis. Electronically Signed   By: Ulyses Jarred M.D.   On: 02/22/2016 21:13    Scheduled Meds: . aspirin  81 mg Oral Daily  . atorvastatin  10 mg Oral q1800  . cefTRIAXone (ROCEPHIN)  IV  2 g Intravenous Q24H  . insulin aspart  0-5 Units Subcutaneous QHS  . insulin aspart  0-9 Units Subcutaneous TID WC  . lactulose  30 g Oral TID  . multivitamin with minerals  1 tablet Oral Daily  . pantoprazole  40 mg Oral Daily  . rifaximin  550 mg Oral BID    Continuous Infusions:    LOS: 2 days   Time spent: 32 minutes  Mir Marry Guan, MD [facility] Pager [phone]  If 7PM-7AM, please contact night-coverage [email or web address] Password [facility] 02/23/2016, 8:57 AM

## 2016-02-23 NOTE — Consult Note (Signed)
   [facility] CM Inpatient Consult   02/23/2016  Adam Butler 02/10/1956 [id number]   Patient active with [place] Management team.  Patient states he would like ongoing [place] Management follow up.  He states he is having difficulty affording his medications on a fixed income and now in a donut hole with his medications.  [facility] pharmacist referral and will assign to community for follow up. [facility] Care Management does not replace or interfere with any services arranged by inpatient care management staff.  For questions, please contact:  Natividad Brood, RN BSN [facility] Liaison  [phone] business mobile phone Toll free office [phone]

## 2016-02-24 ENCOUNTER — Inpatient Hospital Stay (HOSPITAL_COMMUNITY): Payer: PPO

## 2016-02-24 DIAGNOSIS — R6520 Severe sepsis without septic shock: Secondary | ICD-10-CM

## 2016-02-24 DIAGNOSIS — K7581 Nonalcoholic steatohepatitis (NASH): Secondary | ICD-10-CM | POA: Diagnosis not present

## 2016-02-24 DIAGNOSIS — R7881 Bacteremia: Secondary | ICD-10-CM

## 2016-02-24 DIAGNOSIS — A419 Sepsis, unspecified organism: Secondary | ICD-10-CM

## 2016-02-24 DIAGNOSIS — B961 Klebsiella pneumoniae [K. pneumoniae] as the cause of diseases classified elsewhere: Secondary | ICD-10-CM

## 2016-02-24 LAB — CBC
HEMATOCRIT: 32.7 % — AB (ref 39.0–52.0)
HEMOGLOBIN: 10.5 g/dL — AB (ref 13.0–17.0)
MCH: 32.9 pg (ref 26.0–34.0)
MCHC: 32.1 g/dL (ref 30.0–36.0)
MCV: 102.5 fL — ABNORMAL HIGH (ref 78.0–100.0)
Platelets: 47 10*3/uL — ABNORMAL LOW (ref 150–400)
RBC: 3.19 MIL/uL — ABNORMAL LOW (ref 4.22–5.81)
RDW: 14.6 % (ref 11.5–15.5)
WBC: 3.8 10*3/uL — ABNORMAL LOW (ref 4.0–10.5)

## 2016-02-24 LAB — CULTURE, BLOOD (ROUTINE X 2)

## 2016-02-24 LAB — BASIC METABOLIC PANEL
ANION GAP: 7 (ref 5–15)
BUN: 9 mg/dL (ref 6–20)
CO2: 23 mmol/L (ref 22–32)
Calcium: 8.6 mg/dL — ABNORMAL LOW (ref 8.9–10.3)
Chloride: 105 mmol/L (ref 101–111)
Creatinine, Ser: 0.8 mg/dL (ref 0.61–1.24)
GFR calc Af Amer: >60 mL/min (ref >60)
GFR calc non Af Amer: >60 mL/min (ref >60)
GLUCOSE: 301 mg/dL — AB (ref 65–99)
POTASSIUM: 3.7 mmol/L (ref 3.5–5.1)
Sodium: 135 mmol/L (ref 135–145)

## 2016-02-24 LAB — GLUCOSE, CAPILLARY
GLUCOSE-CAPILLARY: 220 mg/dL — AB (ref 65–99)
Glucose-Capillary: 263 mg/dL — ABNORMAL HIGH (ref 65–99)
Glucose-Capillary: 226 mg/dL — ABNORMAL HIGH (ref 65–99)

## 2016-02-24 MED ORDER — CIPROFLOXACIN HCL 500 MG PO TABS: 500.0000 mg | ORAL_TABLET | Freq: Two times a day (BID) | ORAL | 0 refills | Status: DC

## 2016-02-24 MED ORDER — PERFLUTREN LIPID MICROSPHERE
INTRAVENOUS | Status: AC
  Filled 2016-02-24: qty 10

## 2016-02-24 MED ORDER — AMOXICILLIN-POT CLAVULANATE 875-125 MG PO TABS: 1.0000 | ORAL_TABLET | Freq: Two times a day (BID) | ORAL | 0 refills | Status: DC

## 2016-02-24 MED ORDER — CEFAZOLIN SODIUM-DEXTROSE 2-4 GM/100ML-% IV SOLN
2.0000 g | Freq: Three times a day (TID) | INTRAVENOUS | Status: DC
  Administered 2016-02-24: 2 g via INTRAVENOUS
  Filled 2016-02-24 (×3): qty 100

## 2016-02-24 MED ORDER — PERFLUTREN LIPID MICROSPHERE
1.0000 mL | INTRAVENOUS | Status: AC | PRN
  Administered 2016-02-24: 2 mL via INTRAVENOUS
  Filled 2016-02-24: qty 10

## 2016-02-24 NOTE — Progress Notes (Signed)
  Echocardiogram 2D Echocardiogram with Definity has been performed.  Adam Butler 02/24/2016, 11:42 AM

## 2016-02-24 NOTE — Care Management Important Message (Signed)
Important Message  Patient Details  Name: Adam Butler MRN: [id number] Date of Birth: 11/27/1955   Medicare Important Message Given:  Yes    Loann Quill 02/24/2016, 11:47 AM

## 2016-02-24 NOTE — Discharge Summary (Addendum)
Physician Discharge Summary  Adam Butler YME:[id number] DOB: [birth year] DOA: 02/21/2016  PCP: [name], NP  Admit date: 02/21/2016 Discharge date: 02/24/2016  Time spent: 35 minutes  Recommendations for Outpatient Follow-up:  1. Blood cultures growing Klebsiella, likely coming from GI source as patient has history of cirrhosis. Infectious disease recommended discharging him on Augmentin. Adam Butler Expressed concerns about being able to afford this antibiotic and wanted me to prescribe a $4 medication on the [place] list. He was discharged on ciprofloxacin 500 mg by mouth twice a day 2. Please follow-up on volume status, Lasix and spironolactone were restarted on discharge   Discharge Diagnoses:  Principal Problem:   Severe sepsis ([place]) Active Problems:   Liver cirrhosis secondary to NASH   Type 2 diabetes mellitus ([place])   Bacteremia due to Klebsiella pneumoniae   Discharge Condition: Stable/improved  Diet recommendation: Low salt, fluid restriction diet  Filed Weights   02/21/16 2059 02/22/16 1900  Weight: 122.5 kg (270 lb) 132.6 kg (292 lb 5.3 oz)    History of present illness:  Adam Butler is a 60 y.o. male with medical history significant of NASH cirrhosis.  Patient presents to the ED with c/o acute onset of shaking chills, fever to 103 this evening.  Symptoms are constant and persistent since onset.  He was feeling fine earlier today, went to meet spouse at restaurant when they noticed shaking chills.  Unclear of source of fever.  He specifically denies: abd pain, headache, N/V/D, rashes or ulcers, SOB, cough worse than his baseline smokers cough, dysuria  Hospital Course:  Adam Butler is a pleasant 60 year old gentleman with a past medical history of NASH cirrhosis, admitted to medicine service on 02/21/2016 presented with complaints of chills, rigors, fever. Further workup included a CT scan of abdomen and pelvis with IV contrast which revealed evidence of  hepatic cirrhosis and advanced portal hypertension. Radiology reporting poor opacification of the portal vein which could be related to bolus timing, although they could not exclude a thrombus. Blood cultures drawn on admission positive for Klebsiella pneumoniae. He had sepsis criteria evidence by hypotension, positive blood cultures growing Klebsiella pneumonia, elevated lactate of 2.63. Having history of cirrhosis source of infection was felt to be GI related. Dr Tommy Medal of infectious disease was consulted. During this hospitalization he was treated with IV Zosyn. Blood cultures drawn on 02/21/2016 were finalized on 02/24/2016 that showed that organism was sensitive to Augmentin, fluoroquinolones and cephalosporins. Case was discussed with Dr. Tommy Medal who recommended changing to Augmentin. Adam Butler told me that he had fallen in the "doughnut hole" and was not sure he would be to afford this medication. He was interested in utilizing a medication that would be on the $4 [employer] list. He was discharged on ciprofloxacin 5 mg by mouth twice a day. With regard to question of portal thrombosis this was further worked up with MRI of liver on 02/23/2016. Radiology reported that there was normal flow void in the portal vein and portal vein appear to be grossly patent. Radiology reported that nonocclusive thrombus would be difficult to entirely exclude however was not strongly favored. I spoke with Dr Graylon Good of infectious disease on 02/24/2016 who agreed with discharging him on this date. He will follow-up in the infectious disease clinic in 1-2 weeks.  Consultations:  Infectious disease  Discharge Exam: Vitals:   02/24/16 0613 02/24/16 1509  BP: (!) 118/50 131/64  Pulse: 73 71  Resp: 18 18  Temp: 98.3 F (36.8  C) 97.7 F (36.5 C)    General: Nontoxic appearing, he is awake and alert, anxious to go home Cardiovascular: Regular rate and rhythm normal S1-S2 Respiratory: Normal respiratory effort,  lungs are clear Abdomen: Air is some abdominal distention, positive fluid wave Extremities: 2+ bilateral lower extremity pitting edema  Discharge Instructions   Discharge Instructions    AMB Referral to [facility] Management    Complete by:  As directed   Reason for consult:  Post hospital follow up on medications and monitoring   Expected date of contact:  1-3 days (reserved for hospital discharges)   Please assign patient to pharmacy patient unable to afford medications - in donut with medications.  Please assign to community nurse for transition of care calls and assess for home visits. Questions please call:   Natividad Brood, RN BSN [facility] Liaison  [phone] business mobile phone Toll free office [phone]   Call MD for:    Complete by:  As directed   Call MD for:  difficulty breathing, headache or visual disturbances    Complete by:  As directed   Call MD for:  extreme fatigue    Complete by:  As directed   Call MD for:  hives    Complete by:  As directed   Call MD for:  persistant dizziness or light-headedness    Complete by:  As directed   Call MD for:  persistant nausea and vomiting    Complete by:  As directed   Call MD for:  redness, tenderness, or signs of infection (pain, swelling, redness, odor or green/yellow discharge around incision site)    Complete by:  As directed   Call MD for:  severe uncontrolled pain    Complete by:  As directed   Call MD for:  temperature >100.4    Complete by:  As directed   Diet - low sodium heart healthy    Complete by:  As directed   Increase activity slowly    Complete by:  As directed     Current Discharge Medication List    START taking these medications   Details  ciprofloxacin (CIPRO) 500 MG tablet Take 1 tablet (500 mg total) by mouth 2 (two) times daily. Qty: 20 tablet, Refills: 0      CONTINUE these medications which have NOT CHANGED   Details  aspirin 81 MG tablet Take 81 mg by mouth daily.      atorvastatin (LIPITOR) 10 MG tablet Take 1 tablet (10 mg total) by mouth daily. Qty: 90 tablet, Refills: 1   Associated Diagnoses: HLD (hyperlipidemia)    furosemide (LASIX) 40 MG tablet TAKE 1 TABLET EVERY MORNING Qty: 90 tablet, Refills: 1    insulin regular (NOVOLIN R,HUMULIN R) 100 units/mL injection According to sliding scale: 151-200: 2 units, 201-250 4 units, 251-300 6 units, 301-350 8 units. If > 400 call me. Qty: 10 mL, Refills: 11   Associated Diagnoses: Type 2 diabetes mellitus without complication, with long-term current use of insulin (HCC)    metFORMIN (GLUCOPHAGE) 1000 MG tablet Take 1,000 mg by mouth 2 (two) times daily. Refills: 11    Multiple Vitamin (MULTIVITAMIN) capsule Take 1 capsule by mouth daily.      nitroGLYCERIN (NITROSTAT) 0.4 MG SL tablet Place 1 tablet (0.4 mg total) under the tongue every 5 (five) minutes as needed for chest pain (chest pain). Qty: 25 tablet, Refills: 5    omeprazole (PRILOSEC) 20 MG capsule TAKE 1 CAPSULE (20 MG  TOTAL) BY MOUTH DAILY. Qty: 30 capsule, Refills: 8    rifaximin (XIFAXAN) 550 MG TABS tablet Take 1 tablet (550 mg total) by mouth 2 (two) times daily. Qty: 60 tablet, Refills: 2    spironolactone (ALDACTONE) 25 MG tablet Take 0.5 tablets (12.5 mg total) by mouth daily. Qty: 30 tablet, Refills: 2    lactulose (CHRONULAC) 10 GM/15ML solution TAKE 45 ML 3 TIMES A DAY ON SCHEDULE. IF NO BM BY 1PM TAKE ADDT'L 45 ML DOSE Qty: 4730 mL, Refills: 2       Allergies  Allergen Reactions  . Isosorbide Nitrate Other (See Comments)    Nose bleeds   Follow-up Information    BAITY, [name], NP Follow up in 1 week(s).   Specialty:  Internal Medicine Contact information: [place] [address] [place], MD Follow up in 1 week(s).   Specialty:  Infectious Diseases Contact information: [address] [employer] [place] [employer] [place] [address] [phone]             The results of significant diagnostics from this hospitalization (including imaging, microbiology, ancillary and laboratory) are listed below for reference.    Significant Diagnostic Studies: Adam Abdomen Wo Contrast  Result Date: 02/24/2016 CLINICAL DATA:  60 year old male with history of portal vein thrombosis. History of NASH cirrhosis. Acute onset of shaking chills and fever to 103 degrees prior to recent admission. Klebsiella positive blood cultures. EXAM: MRI ABDOMEN WITHOUT CONTRAST TECHNIQUE: Multiplanar multisequence Adam imaging was performed without the administration of intravenous contrast. COMPARISON:  CT the abdomen and pelvis 02/22/2016. FINDINGS: Comment: Study is limited for detection and characterization of visceral and/or vascular abnormalities by lack of IV gadolinium. Lower chest:  Unremarkable. Hepatobiliary: The liver has a shrunken appearance and nodular contour, compatible with cirrhosis. No discrete cystic or solid hepatic lesions are confidently identified on today's noncontrast examination. No intra or extrahepatic biliary ductal dilatation. Multiple small filling defects lie dependently in the gallbladder, compatible with small gallstones. Gallbladder wall does not appear thickened. Trace amount of pericholecystic fluid is nonspecific in the setting of a small volume of ascites. Pancreas: No definite pancreatic mass identified on today's noncontrast examination. No pancreatic ductal dilatation. Spleen: Spleen is enlarged measuring 17.2 x 7.0 x 14.6 cm (estimated splenic volume of 879 mL). Adrenals/Urinary Tract: Multiple T1 hypointense, T2 hyperintense lesions are noted in the right kidney, incompletely characterized on today's noncontrast examination, but likely to represent cysts, measuring up to 1.5 cm in the posterior aspect of the interpolar region. No hydroureteronephrosis in the visualized abdomen. Bilateral adrenal glands are normal in appearance. Stomach/Bowel:  Unremarkable. Vascular/Lymphatic: Poorly evaluated on today's noncontrast examination. No aneurysm identified in the visualized abdominal vasculature. Portal vein is dilated measuring up to 20 mm. There is normal flow void within the portal vein, indicative of patency. The recannulized paraumbilical vein is noted. Numerous gastric and paraesophageal varices are noted. No definite lymphadenopathy identified in the abdomen on today's noncontrast examination. Other: Small volume of ascites, predominantly in the perihepatic region. Musculoskeletal: No definite aggressive appearing osseous lesions are noted in the visualized portions of the skeleton. IMPRESSION: 1. Stigmata of cirrhosis with portal hypertension, including splenomegaly and numerous portosystemic collateral pathways, including both large gastric and distal paraesophageal varices, as above. 2. Accurate assessment for portal vein thrombosis is limited on today's noncontrast examination, however, given the normal flow void in the portal vein, the portal vein appears to be  at least grossly patent at this time (nonocclusive thrombus would be difficult to entirely exclude, but is not strongly favored). 3. Small volume of ascites. 4. Cholelithiasis. Electronically Signed   By: Vinnie Langton M.D.   On: 02/24/2016 09:10   Ct Abdomen Pelvis W Contrast  Result Date: 02/22/2016 CLINICAL DATA:  Bacteremia.  Hepatic cirrhosis. EXAM: CT ABDOMEN AND PELVIS WITH CONTRAST TECHNIQUE: Multidetector CT imaging of the abdomen and pelvis was performed using the standard protocol following bolus administration of intravenous contrast. CONTRAST:  100 ml ISOVUE-300 IOPAMIDOL (ISOVUE-300) INJECTION 61% COMPARISON:  Abdominal ultrasound 12/04/2015, CT abdomen/ pelvis 04/14/2015 FINDINGS: Lower chest:  No pulmonary nodules or masses.  No pleural effusion. Hepatobiliary: Diffusely nodular hepatic contours compatible with cirrhosis. No focal liver lesion identified. Small volume  perihepatic ascites. Cholelithiasis without evidence of acute cholecystitis. No visualized biliary dilatation. Pancreas: Normal pancreatic enhancement without mass lesion or calcification. No peripancreatic fluid collection. Spleen: The spleen is enlarged, measuring 15.2 cm in craniocaudal dimension and 17.4 cm and AP dimension. Adrenals/Urinary Tract: Sub cm right adrenal nodule is unchanged. Left adrenal gland is normal. Small left upper pole renal calculus. No hydronephrosis. Sub cm hypodensity of the right kidney is too small to characterize accurately. Mild bilateral perinephric stranding. Stomach/Bowel: Stomach is decompressed. There is rectosigmoid diverticulosis. No dilated small bowel. There is a small amount of free fluid within the pelvis and right lower quadrant abdomen. Vascular/Lymphatic: There are prominent perisplenic, paraesophageal and gastrohepatic varices. There is a recannulized umbilical vein. There is calcification within the extrahepatic main portal vein and superior mesenteric vein. There is poor contrast opacification of the portal vein. No abdominal or pelvic lymphadenopathy by CT size criteria. Reproductive: Prostate and seminal vesicles are normal. Other: Prominent varices are noted within the subcutaneous fat of the anterior abdomen. Musculoskeletal: There is multilevel facet hypertrophy. Multiple thoracic osteophytes. No lytic or blastic osseous lesions. IMPRESSION: 1. Findings of hepatic cirrhosis advanced portal hypertension, including splenomegaly and paraesophageal, gastrohepatic and perisplenic varices. 2. Recannulized umbilical vein and prominent anterior abdominal wall subcutaneous varices. 3. Small volume lower abdominal and pelvic free fluid without evidence of abscess formation. 4. Poor opacification of the portal vein and superior mesenteric vein may be secondary to bolus timing, but the thrombus is not excluded. This could be further evaluated with abdominal MRI or  dedicated hepatic Doppler ultrasound, if clinically indicated. 5. Cholelithiasis without evidence of acute cholecystitis. 6. Sigmoid diverticulosis without acute diverticulitis. Electronically Signed   By: Ulyses Jarred M.D.   On: 02/22/2016 21:13   Dg Chest Port 1 View  Result Date: 02/21/2016 CLINICAL DATA:  Acute onset of altered mental status. Confusion. Hyperglycemia. EXAM: PORTABLE CHEST 1 VIEW COMPARISON:  Chest radiograph performed 12/17/2015 FINDINGS: The lungs are hypoexpanded. Vascular congestion is noted. Increased interstitial markings may reflect mild interstitial edema. No pleural effusion or pneumothorax is seen. The cardiomediastinal silhouette is borderline normal in size. No acute osseous abnormalities are identified. There is mild chronic deformity of the left mid clavicle. IMPRESSION: Lungs hypoexpanded. Vascular congestion noted. Increased interstitial markings may reflect mild interstitial edema. Electronically Signed   By: Garald Balding M.D.   On: 02/21/2016 21:32    Microbiology: Recent Results (from the past 240 hour(s))  Culture, blood (Routine x 2)     Status: Abnormal   Collection Time: 02/21/16  8:50 PM  Result Value Ref Range Status   Specimen Description BLOOD RIGHT WRIST  Final   Special Requests BOTTLES DRAWN AEROBIC AND ANAEROBIC 5CC EA  Final   Culture  Setup Time   Final    GRAM NEGATIVE RODS IN BOTH AEROBIC AND ANAEROBIC BOTTLES CRITICAL RESULT CALLED TO, READ BACK BY AND VERIFIED WITH: T STONE,PHARMD AT 1000 02/22/16 BY M WILSON    Culture KLEBSIELLA PNEUMONIAE (A)  Final   Report Status 02/24/2016 FINAL  Final   Organism ID, Bacteria KLEBSIELLA PNEUMONIAE  Final      Susceptibility   Klebsiella pneumoniae - MIC*    AMPICILLIN >=32 RESISTANT Resistant     CEFAZOLIN <=4 SENSITIVE Sensitive     CEFEPIME <=1 SENSITIVE Sensitive     CEFTAZIDIME <=1 SENSITIVE Sensitive     CEFTRIAXONE <=1 SENSITIVE Sensitive     CIPROFLOXACIN <=0.25 SENSITIVE Sensitive      GENTAMICIN <=1 SENSITIVE Sensitive     IMIPENEM <=0.25 SENSITIVE Sensitive     TRIMETH/SULFA <=20 SENSITIVE Sensitive     AMPICILLIN/SULBACTAM 4 SENSITIVE Sensitive     PIP/TAZO <=4 SENSITIVE Sensitive     Extended ESBL NEGATIVE Sensitive     * KLEBSIELLA PNEUMONIAE  Blood Culture ID Panel (Reflexed)     Status: Abnormal   Collection Time: 02/21/16  8:50 PM  Result Value Ref Range Status   Enterococcus species NOT DETECTED NOT DETECTED Final   Vancomycin resistance NOT DETECTED NOT DETECTED Final   Listeria monocytogenes NOT DETECTED NOT DETECTED Final   Staphylococcus species NOT DETECTED NOT DETECTED Final   Staphylococcus aureus NOT DETECTED NOT DETECTED Final   Methicillin resistance NOT DETECTED NOT DETECTED Final   Streptococcus species NOT DETECTED NOT DETECTED Final   Streptococcus agalactiae NOT DETECTED NOT DETECTED Final   Streptococcus pneumoniae NOT DETECTED NOT DETECTED Final   Streptococcus pyogenes NOT DETECTED NOT DETECTED Final   Acinetobacter baumannii NOT DETECTED NOT DETECTED Final   Enterobacteriaceae species DETECTED (A) NOT DETECTED Final    Comment: CRITICAL RESULT CALLED TO, READ BACK BY AND VERIFIED WITH: T. Stone Pharm.D. 10:00 02/22/16  ([email or web address])    Enterobacter cloacae complex NOT DETECTED NOT DETECTED Final   Escherichia coli NOT DETECTED NOT DETECTED Final   Klebsiella oxytoca NOT DETECTED NOT DETECTED Final   Klebsiella pneumoniae DETECTED (A) NOT DETECTED Final    Comment: CRITICAL RESULT CALLED TO, READ BACK BY AND VERIFIED WITH: T. Stone Pharm.D. 10:00 02/22/16  ([email or web address])    Proteus species NOT DETECTED NOT DETECTED Final   Serratia marcescens NOT DETECTED NOT DETECTED Final   Carbapenem resistance NOT DETECTED NOT DETECTED Final   Haemophilus influenzae NOT DETECTED NOT DETECTED Final   Neisseria meningitidis NOT DETECTED NOT DETECTED Final   Pseudomonas aeruginosa NOT DETECTED NOT DETECTED Final   Candida albicans NOT DETECTED NOT DETECTED  Final   Candida glabrata NOT DETECTED NOT DETECTED Final   Candida krusei NOT DETECTED NOT DETECTED Final   Candida parapsilosis NOT DETECTED NOT DETECTED Final   Candida tropicalis NOT DETECTED NOT DETECTED Final  Culture, blood (Routine x 2)     Status: Abnormal   Collection Time: 02/21/16  8:55 PM  Result Value Ref Range Status   Specimen Description BLOOD LEFT ANTECUBITAL  Final   Special Requests IN PEDIATRIC BOTTLE 1CC  Final   Culture  Setup Time   Final    GRAM NEGATIVE RODS IN PEDIATRIC BOTTLE CRITICAL RESULT CALLED TO, READ BACK BY AND VERIFIED WITH: T STONE,PHARMD AT 1000 02/22/16 BY M WILSON    Culture (A)  Final    KLEBSIELLA PNEUMONIAE SUSCEPTIBILITIES PERFORMED ON PREVIOUS CULTURE WITHIN THE LAST 5 DAYS.  Report Status 02/24/2016 FINAL  Final  Urine culture     Status: Abnormal   Collection Time: 02/21/16  9:16 PM  Result Value Ref Range Status   Specimen Description URINE, RANDOM  Final   Special Requests NONE  Final   Culture <10,000 COLONIES/mL INSIGNIFICANT GROWTH (A)  Final   Report Status 02/23/2016 FINAL  Final  MRSA PCR Screening     Status: None   Collection Time: 02/22/16 12:01 AM  Result Value Ref Range Status   MRSA by PCR NEGATIVE NEGATIVE Final    Comment:        The GeneXpert MRSA Assay (FDA approved for NASAL specimens only), is one component of a comprehensive MRSA colonization surveillance program. It is not intended to diagnose MRSA infection nor to guide or monitor treatment for MRSA infections.   Culture, blood (routine x 2)     Status: None (Preliminary result)   Collection Time: 02/22/16 10:30 AM  Result Value Ref Range Status   Specimen Description BLOOD LEFT HAND  Final   Special Requests BOTTLES DRAWN AEROBIC AND ANAEROBIC  5CC  Final   Culture NO GROWTH 2 DAYS  Final   Report Status PENDING  Incomplete  Culture, blood (routine x 2)     Status: None (Preliminary result)   Collection Time: 02/22/16 10:40 AM  Result Value Ref  Range Status   Specimen Description BLOOD RIGHT HAND  Final   Special Requests BOTTLES DRAWN AEROBIC AND ANAEROBIC  5CC  Final   Culture NO GROWTH 2 DAYS  Final   Report Status PENDING  Incomplete     Labs: Basic Metabolic Panel:  Recent Labs Lab 02/21/16 2050 02/22/16 0415 02/23/16 0549 02/24/16 0736  NA 130* 135 135 135  K 3.7 3.6 3.7 3.7  CL 100* 105 108 105  CO2 21* 23 22 23   GLUCOSE 343* 148* 223* 301*  BUN <5* 5* 8 9  CREATININE 0.84 0.76 0.80 0.80  CALCIUM 8.5* 7.9* 8.1* 8.6*   Liver Function Tests:  Recent Labs Lab 02/21/16 2050  AST 79*  ALT 47  ALKPHOS 231*  BILITOT 2.1*  PROT 6.8  ALBUMIN 2.8*   No results for input(s): LIPASE, AMYLASE in the last 168 hours.  Recent Labs Lab 02/21/16 2050  AMMONIA 88*   CBC:  Recent Labs Lab 02/21/16 2050 02/22/16 0415 02/23/16 0549 02/24/16 0736  WBC 8.4 7.5 4.2 3.8*  NEUTROABS 6.8  --   --   --   HGB 11.9* 10.6* 10.4* 10.5*  HCT 35.5* 31.8* 31.9* 32.7*  MCV 99.4 99.4 102.6* 102.5*  PLT 55* 42* 41* 47*   Cardiac Enzymes: No results for input(s): CKTOTAL, CKMB, CKMBINDEX, TROPONINI in the last 168 hours. BNP: BNP (last 3 results)  Recent Labs  06/05/15 2325 06/27/15 0520  BNP 37.5 35.3    ProBNP (last 3 results) No results for input(s): PROBNP in the last 8760 hours.  CBG:  Recent Labs Lab 02/23/16 1705 02/23/16 2045 02/24/16 0810 02/24/16 1215 02/24/16 1717  GLUCAP 212* 154* 263* 220* 226*       Signed:  Kelvin Cellar MD.  [facility] 02/24/2016, 5:29 PM

## 2016-02-25 ENCOUNTER — Encounter: Payer: Self-pay | Admitting: *Deleted

## 2016-02-25 ENCOUNTER — Other Ambulatory Visit: Payer: Self-pay | Admitting: *Deleted

## 2016-02-25 ENCOUNTER — Telehealth: Payer: Self-pay

## 2016-02-25 NOTE — Patient Outreach (Signed)
[facility]) Care Management [place] Telephone Outreach, Transition of Care day 1 02/25/2016  DERRICH GABY [birth year] [id number]   JAGGAR BENKO is an 60 y.o. male male previouslyfollowed by [facility] after recent IP hospital visit June 7-9, 2017 for confusion, hepatic encephalopathy, CHF, and questionable diet/ medication compliance.Patient successfully met his goals and was transitioned to [place], with [place] referral for medication assistance. Unfortunately, patient was re-admitted August 13-16, 2017 for fever, chills, and was found to have sepsis/ bacteremia with probable GI source, and is now followed by [facility] for transittion of care after most recent hospitalization.  Patient noted to have history of liver cirrhosis/ NASH with portal HTN, obesity, and Type II DM.  Today, Mr. Castiglia reports that he is "100% better" after his hospital visit.  Mr. Hersch states that he has all of his medications, but continues to report that he needs financial assistance before refilling some of his medications ([place] involved in patient's care for medication assistance).  Mr. Reger stated that he does have his antibiotic and his probiotic and that he is taking both as prescribed.  Mr. Dumlao also reports that he has a follow up visit with his PCP scheduled for next week.  We discussed [facility] services, which Mr. Jividen is very familiar with, as he was previously followed by [place].  Patient  denies further needs, concerns, problems, or issues today.  Plan:  Mr. Scheel will take his medications as they are prescribed and will attend all provider appointments.  Mr. Troiano will work with [facility] pharmacy to explore options for the financial medication assistance he needs.  Continued [facility] telephone outreach for ongoing transition of care scheduled for next week.   [name], RN, BSN, [employer] [facility] Care Management  [phone]

## 2016-02-25 NOTE — Telephone Encounter (Signed)
Transition Care Management Follow-up Telephone Call    Date discharged? 02/24/2016  How have you been since you were released from the hospital? Adam Butler.   Any patient concerns? Pt is having difficulty affording some of his medications due to being in donut hole with insurance.    Items Reviewed:  Medications reviewed: Yes  Allergies reviewed: Yes  Dietary changes reviewed: N/A  Referrals reviewed: N/A   Functional Questionnaire:  Independent - I Dependent - D    Activities of Daily Living (ADLs):    Personal hygiene - I Dressing - I Eating - I Maintaining continence - I Transferring - I (may use a cane PRN)  Independent Activities of Daily Living (iADLs): Basic communication skills - I Transportation - I Meal preparation  - I Shopping - I Housework - I  Managing medications - I  Managing personal finances - I   Confirmed importance and date/time of follow-up visits scheduled YES  Provider Appointment booked with PCP 03/02/16 @ 1315  Confirmed with patient if condition begins to worsen call PCP or go to the ER.  Patient was given the office number and encouraged to call back with question or concerns: YES

## 2016-02-27 LAB — CULTURE, BLOOD (ROUTINE X 2)
CULTURE: NO GROWTH
Culture: NO GROWTH

## 2016-02-29 ENCOUNTER — Other Ambulatory Visit: Payer: Self-pay | Admitting: Pharmacist

## 2016-02-29 NOTE — Patient Outreach (Signed)
Adam Butler) Care Management  Adam Butler   02/29/2016  Adam Butler 1955/09/12 [id number]  Subjective:  Patient was referred to [place] by Adam Butler, Adam Butler, for medication assistance evaluation.  Patient was admitted 8/13-8/16/17.   Patient answered phone call and verified his HIPAA details.  Patient states that he is in the coverage gap on his Part D plan and was most concerned about getting his lactulose---he states he has it now.    Per review of notes in chart from 07/2015, Adam Butler had previously worked with patient on medication assistance.  Patient reports that he thinks he would still be over income requirement for [facility] Extra Help.   Patient reports that he is getting his rifaximin (Xifaxan) from manufacturer patient assistance program and he reports he is working on applying to [place] patient assistance program with his cardiologist office.    Patient reviewed medications over the phone and states that he has medications now but is concerned about his refills while in the donut hole.   He reports he is getting his insulin at [facility] for ~$25/vial at this time.     Objective:   Current Medications: Current Outpatient Prescriptions  Medication Sig Dispense Refill  . aspirin 81 MG tablet Take 81 mg by mouth daily.     Marland Kitchen atorvastatin (LIPITOR) 10 MG tablet Take 1 tablet (10 mg total) by mouth daily. 90 tablet 1  . ciprofloxacin (CIPRO) 500 MG tablet Take 1 tablet (500 mg total) by mouth 2 (two) times daily. 20 tablet 0  . furosemide (LASIX) 40 MG tablet TAKE 1 TABLET EVERY MORNING 90 tablet 1  . insulin regular (NOVOLIN R,HUMULIN R) 100 units/mL injection According to sliding scale: 151-200: 2 units, 201-250 4 units, 251-300 6 units, 301-350 8 units. If > 400 call me. 10 mL 11  . lactulose (CHRONULAC) 10 GM/15ML solution TAKE 45 ML 3 TIMES A DAY ON SCHEDULE. IF NO BM BY 1PM TAKE ADDT'L 45 ML DOSE 4730 mL 2  .  metFORMIN (GLUCOPHAGE) 1000 MG tablet Take 1,000 mg by mouth 2 (two) times daily.  11  . Multiple Vitamin (MULTIVITAMIN) capsule Take 1 capsule by mouth daily.      . nitroGLYCERIN (NITROSTAT) 0.4 MG SL tablet Place 1 tablet (0.4 mg total) under the tongue every 5 (five) minutes as needed for chest pain (chest pain). 25 tablet 5  . omeprazole (PRILOSEC) 20 MG capsule TAKE 1 CAPSULE (20 MG TOTAL) BY MOUTH DAILY. 30 capsule 8  . rifaximin (XIFAXAN) 550 MG TABS tablet Take 1 tablet (550 mg total) by mouth 2 (two) times daily. 60 tablet 2  . spironolactone (ALDACTONE) 25 MG tablet Take 0.5 tablets (12.5 mg total) by mouth daily. 30 tablet 2   No current facility-administered medications for this visit.     Functional Status: In your present state of health, do you have any difficulty performing the following activities: 02/22/2016 01/11/2016  Hearing? N N  Vision? Y Y  Difficulty concentrating or making decisions? N N  Walking or climbing stairs? N N  Dressing or bathing? N N  Doing errands, shopping? N N  Preparing Food and eating ? - N  Using the Toilet? - N  In the past six months, have you accidently leaked urine? - N  Do you have problems with loss of bowel control? - N  Managing your Medications? - N  Managing your Finances? - N  Housekeeping or managing your Housekeeping? -  N  Some recent data might be hidden    Fall/Depression Screening: PHQ 2/9 Scores 01/11/2016 12/15/2015 10/22/2015 09/01/2015 07/22/2015 04/23/2015  PHQ - 2 Score 0 0 0 0 0 4  PHQ- 9 Score - - - - - 8    Assessment:  Offered a home visit to patient to review his medications and he declined at this time.    Medication assistance:  Counseled patient on the coverage gap ("donut hole") for Medicare Part  D, that Medicare determines it each year.    Given patient is mostly concerned about generic medications, options are limited due to there not being manufacturer patient assistance programs for many generic  medications.    Patient reports he is already receiving rifaximin from manufacturer patient assistance program and states he is trying to apply for Ranexa patient assistance.    Discussed [employer] patient assistance program for Novolin R.  It requires Part D beneficiaries spend at least $1,000 out-of-pocket on prescriptions in calendar year.  He believes he has spent this, but wants to hold off on applying.    Discussed generic discount list at [place] or [facility] as potential cost savings options for generic medications.  Per phone call to [facility], 4000 mL of lactulose was quoted as being ~$60, explained to patient this would be a little less than a 30 day supply.    Medication Review:  Drugs sorted by system:  Cardiovascular: -aspirin -atorvastatin  -furosemide  -sublingual nitroglycerin  -spironolactone   Gastrointestinal: -omeprazole  -rifaximin  -lactulose  Endocrine: -insulin (Novolin R)  -metformin  Vitamins/Minerals: -multivitamin   Infectious Diseases: -ciprofloxacin   Other issues noted:  -Prior to admission patient was on nadolol and ranolazine (Ranexa), they are no longer on his active medication list following discharge.   Suggest clarifying if these medications were meant to be discontinued.     Plan:  Per patient request, will attempt to follow-up with him next week via phone.    Patient may have cost-savings on his generic medications if he were to use the generic discount list at [facility] or [place].   Suggest patient consider applying to [employer] patient assistance program for his insulin.  Will route note to PCP.    Karrie Meres, PharmD, [place] [phone]

## 2016-03-02 ENCOUNTER — Encounter: Payer: Self-pay | Admitting: Internal Medicine

## 2016-03-02 ENCOUNTER — Ambulatory Visit (INDEPENDENT_AMBULATORY_CARE_PROVIDER_SITE_OTHER): Payer: PPO | Admitting: Internal Medicine

## 2016-03-02 VITALS — BP 122/68 | HR 94 | Temp 98.3°F | Wt 278.0 lb

## 2016-03-02 DIAGNOSIS — A419 Sepsis, unspecified organism: Secondary | ICD-10-CM

## 2016-03-02 NOTE — Patient Instructions (Signed)
Sepsis, Adult Sepsis is a serious infection of your blood or tissues that affects your whole body. The infection that causes sepsis may be bacterial, viral, fungal, or parasitic. Sepsis may be life threatening. Sepsis can cause your blood pressure to drop. This may result in shock. Shock causes your central nervous system and your organs to stop working correctly.  RISK FACTORS Sepsis can happen in anyone, but it is more likely to happen in people who have weakened immune systems. SIGNS AND SYMPTOMS  Symptoms of sepsis can include:  Fever or low body temperature (hypothermia).  Rapid breathing (hyperventilation).  Chills.  Rapid heartbeat (tachycardia).  Confusion or light-headedness.  Trouble breathing.  Urinating much less than usual.  Cool, clammy skin or red, flushed skin.  Other problems with the heart, kidneys, or brain. DIAGNOSIS  Your health care provider will likely do tests to look for an infection, to see if the infection has spread to your blood, and to see how serious your condition is. Tests can include:  Blood tests, including cultures of your blood.  Cultures of other fluids from your body, such as:  Urine.  Pus from wounds.  Mucus coughed up from your lungs.  Urine tests other than cultures.  X-ray exams or other imaging tests. TREATMENT  Treatment will begin with elimination of the source of infection. If your sepsis is likely caused by a bacterial or fungal infection, you will be given antibiotic or antifungal medicines. You may also receive:  Oxygen.  Fluids through an IV tube.  Medicines to increase your blood pressure.  A machine to clean your blood (dialysis) if your kidneys fail.  A machine to help you breathe if your lungs fail. SEEK IMMEDIATE MEDICAL CARE IF: You get an infection or develop any of the signs and symptoms of sepsis after surgery or a hospitalization.   This information is not intended to replace advice given to you by  your health care provider. Make sure you discuss any questions you have with your health care provider.   Document Released: 03/26/2003 Document Revised: 11/11/2014 Document Reviewed: 03/04/2013 Elsevier Interactive Patient Education [employer].

## 2016-03-02 NOTE — Progress Notes (Signed)
Subjective:    Patient ID: Adam Butler, male    DOB: [birth year], 60 y.o.   MRN: [id number]  HPI  Pt presents to the clinic today for TCM hospital followup. He presented to the ER on 02/21/16 with c/o fever and chills. He was hypotensive, lactic acid was elevated. Blood cultures were drawn and eventually grew out Klebsiella Pneumonia. He was treated with IV Zosyn and eventually transitioned to oral Augmentin. He did have some concerns about being able to afford the new medication because he is in the "donut hole". He was switch to Cipro 500 mg BID. Since discharge, he is feeling much. He still has 4 days of antibiotics left. He denies fever, chills, or shortness of breath. His appetite is good. His bowels and bladder are moving normally. He has no concerns.  Review of Systems      Past Medical History:  Diagnosis Date  . Arthritis   . Bacteremia   . Cholelithiasis   . Cirrhosis ([place]) 2011   Cryptogenic, Likely NASH. Family/pt deny EtOH. HCV, HBV, HAV negative. ANA negative. AMA positive. Ascites 12/11  . Coronary artery disease    Inferior MI 12/11; LHC with occluded mid CFX and 80% proximal RCA. EF 55%. He had 3.0 x 28 vision BMS to CFX  . Diastolic CHF, acute ([place])    Echo 12/11 with ef 50-55% and mild LVH. EF 55% by LV0gram in 12/11  . Esophageal varices ([place]) 2011, 2013   no hx acute variceal bleed  . GI bleed    12/11: Etiology not clearly defined. EGD: nonbleeding esophageal varices.  . Hepatic encephalopathy ([place]) 2011, 12/2013  . Hx of echocardiogram    Echo 5/16:  EF 55-60%, no RWMA, mod LAE  . Hyperlipemia   . Hypertension   . Myocardial infarction [facility]) ? 2012  . PFO (patent foramen ovale): Per TEE 04/20/2015 04/20/2015  . Portal hypertension ([place])   . Rectal varices   . Rectal varices   . S/P coronary artery stent placement 06/2010  . Shortness of breath dyspnea   . SVT (supraventricular tachycardia) ([place])    1/12: appeared to be an ectopic atrial tachycardia.  Required DCCV with hemodynamic instability  . Thrombocytopenia ([place])   . Type II diabetes mellitus ([place]) 2011    Current Outpatient Prescriptions  Medication Sig Dispense Refill  . aspirin 81 MG tablet Take 81 mg by mouth daily.     Marland Kitchen atorvastatin (LIPITOR) 10 MG tablet Take 1 tablet (10 mg total) by mouth daily. 90 tablet 1  . ciprofloxacin (CIPRO) 500 MG tablet Take 1 tablet (500 mg total) by mouth 2 (two) times daily. 20 tablet 0  . furosemide (LASIX) 40 MG tablet TAKE 1 TABLET EVERY MORNING 90 tablet 1  . insulin regular (NOVOLIN R,HUMULIN R) 100 units/mL injection According to sliding scale: 151-200: 2 units, 201-250 4 units, 251-300 6 units, 301-350 8 units. If > 400 call me. 10 mL 11  . lactulose (CHRONULAC) 10 GM/15ML solution TAKE 45 ML 3 TIMES A DAY ON SCHEDULE. IF NO BM BY 1PM TAKE ADDT'L 45 ML DOSE 4730 mL 2  . metFORMIN (GLUCOPHAGE) 1000 MG tablet Take 1,000 mg by mouth 2 (two) times daily.  11  . Multiple Vitamin (MULTIVITAMIN) capsule Take 1 capsule by mouth daily.      . nitroGLYCERIN (NITROSTAT) 0.4 MG SL tablet Place 1 tablet (0.4 mg total) under the tongue every 5 (five) minutes as needed for chest pain (chest pain). 25 tablet  5  . omeprazole (PRILOSEC) 20 MG capsule TAKE 1 CAPSULE (20 MG TOTAL) BY MOUTH DAILY. 30 capsule 8  . rifaximin (XIFAXAN) 550 MG TABS tablet Take 1 tablet (550 mg total) by mouth 2 (two) times daily. 60 tablet 2  . spironolactone (ALDACTONE) 25 MG tablet Take 0.5 tablets (12.5 mg total) by mouth daily. 30 tablet 2   No current facility-administered medications for this visit.     Allergies  Allergen Reactions  . Isosorbide Nitrate Other (See Comments)    Nose bleeds    Family History  Problem Relation Age of Onset  . Heart attack Brother 89    MI  . Diabetes Brother   . Heart attack Father   . Diabetes Father   . COPD Father   . COPD Mother   . Heart attack Brother   . Stroke Neg Hx     Social History   Social History  . Marital  status: Married    Spouse name: N/A  . Number of children: 3  . Years of education: N/A   Occupational History  . Unemployed Other    Worked in maintenance prior   Social History Main Topics  . Smoking status: Current Every Day Smoker    Packs/day: 1.00    Years: 36.00    Types: E-cigarettes  . Smokeless tobacco: Never Used     Comment: uses vapor cigarettes (2016 )  . Alcohol use No  . Drug use: No  . Sexual activity: No   Other Topics Concern  . Not on file   Social History Narrative   Married   Gets regular exercise: walking     Constitutional: Denies fever, malaise, fatigue, headache or abrupt weight changes.  Respiratory: Denies difficulty breathing, shortness of breath, cough or sputum production.   Cardiovascular: Denies chest pain, chest tightness, palpitations or swelling in the hands or feet.  Gastrointestinal: Denies abdominal pain, bloating, constipation, diarrhea or blood in the stool.  Neurological: Denies dizziness, difficulty with memory, difficulty with speech or problems with balance and coordination.    No other specific complaints in a complete review of systems (except as listed in HPI above).  Objective:   Physical Exam   BP 122/68   Pulse 94   Temp 98.3 F (36.8 C) (Oral)   Wt 278 lb (126.1 kg)   SpO2 97%   BMI 43.54 kg/m   Wt Readings from Last 3 Encounters:  02/22/16 292 lb 5.3 oz (132.6 kg)  02/09/16 272 lb 1.6 oz (123.4 kg)  01/26/16 278 lb (126.1 kg)    General: Appears his stated age, obese in NAD. Skin: Bruising noted to left upper and left forearm (he reports this is where they had blood pressure cuffs) Cardiovascular: Normal rate and rhythm. S1,S2 noted.  No murmur, rubs or gallops noted.  Pulmonary/Chest: Normal effort and positive vesicular breath sounds. No respiratory distress. No wheezes, rales or ronchi noted.  Abdomen: Soft and nontender.  Neurological: Alert and oriented.    BMET    Component Value Date/Time    NA 135 02/24/2016 0736   K 3.7 02/24/2016 0736   CL 105 02/24/2016 0736   CO2 23 02/24/2016 0736   GLUCOSE 301 (H) 02/24/2016 0736   BUN 9 02/24/2016 0736   CREATININE 0.80 02/24/2016 0736   CREATININE 0.74 03/15/2011 0827   CALCIUM 8.6 (L) 02/24/2016 0736   GFRNONAA >60 02/24/2016 0736   GFRAA >60 02/24/2016 0736    Lipid Panel  Component Value Date/Time   CHOL 133 09/29/2015 1317   TRIG 106.0 09/29/2015 1317   HDL 37.90 (L) 09/29/2015 1317   CHOLHDL 4 09/29/2015 1317   VLDL 21.2 09/29/2015 1317   LDLCALC 74 09/29/2015 1317    CBC    Component Value Date/Time   WBC 3.8 (L) 02/24/2016 0736   RBC 3.19 (L) 02/24/2016 0736   HGB 10.5 (L) 02/24/2016 0736   HCT 32.7 (L) 02/24/2016 0736   PLT 47 (L) 02/24/2016 0736   MCV 102.5 (H) 02/24/2016 0736   MCH 32.9 02/24/2016 0736   MCHC 32.1 02/24/2016 0736   RDW 14.6 02/24/2016 0736   LYMPHSABS 0.4 (L) 02/21/2016 2050   MONOABS 1.1 (H) 02/21/2016 2050   EOSABS 0.0 02/21/2016 2050   BASOSABS 0.0 02/21/2016 2050    Hgb A1C Lab Results  Component Value Date   HGBA1C 6.7 (H) 09/29/2015           Assessment & Plan:   TCM hospital follow up for sepsis:  Hospital notes, labs and imaging reviewed Advised him to continue his course of Cipro until finished Resume normal diet and physical activity Watch for recurrent fever or chills No other medication changes at this time  RTC in April 2018 for your Medicare Wellness  Exam/Follow up [name], NP

## 2016-03-03 ENCOUNTER — Other Ambulatory Visit: Payer: Self-pay | Admitting: *Deleted

## 2016-03-03 NOTE — Patient Outreach (Signed)
[facility]) Care Management [place] Telephone Outreach, Transition of Care day 8 03/03/2016  RICCO DERSHEM 1956/04/29 [id number]  Tracie Lindbloom Breedloveis an 60 y.o.malemale previouslyfollowed by [facility] after recent IP hospital visit June 7-9, 2017 for confusion, hepatic encephalopathy, CHF, and questionable diet/ medication compliance.Patient successfully met his goals and was transitioned to [place], with [place] referral for medication assistance. Unfortunately, patient was re-admitted August 13-16, 2017 for fever, chills, and was found to have sepsis/ bacteremia with probable GI source, and is now followed by [place] for transittion of care after most recent hospitalization.  Patient noted to have history of liver cirrhosis/ NASH with portal HTN, obesity, and Type II DM.  HIPAA verified.  Today, Mr. Stracke continues to report that he is "much better" after his hospital visit.  Mr. Coldren states that he currently has all of his medications, and continues to report that he is working on obtaining financial assistance for some of his medications ([place] involved in patient's care for medication assistance).  Mr. Chaikin stated that he "is switching everything over to [place]," and that he has "gotten the doctors involved for some of my medicines.'  Patient reports that he visited his PCP and was kept on his antibiotic and his probiotic, which should be completed in a few days.  Mr. Weckwerth stated that the visit with his PCP went well and that he "got a good report."  I encouraged Mr. Ottaway to continue working with [facility] pharmacy staff to come to a resolution for the assistance he needs with the costs of his medications.    Mr. Perry stated that he has continued monitoring and recording his daily weights and blood sugars, but is not currently where he can obtain these numbers; Mr. Mayeux reports that they  are "fine, in the range they are supposed to be."  Mr. Freeney continues to deny community resource needs, and voices no concerns, issues, problems, or needs outside of his request for medication costs assistance.  We discussed next [place] outreach, and Mr. Treese voiced that he thinks he is doing well enough that he does not require an in-home visit, stating, "since you were just out here earlier this month, and other than this mess with my medications, I am doing fine."   We contracted that for now [place] outreach for transition of care would be telephonic.  I confirmed that patient had my direct phone line, the 24-hour nurse line number, and the main number to [facility] CM.    Plan:  Mr. Beckerman will take his medications as they are prescribed and will attend all provider appointments.  Mr. Prestia will work with [facility] pharmacy to explore options for the financial medication assistance he needs.  Mr. Uzelac will continue to monitor and record his daily weights and blood sugars.  [name] will contact his providers promptly for any concerns, issues, needs, or problems that arise.  Continued [facility] CM telephone outreach for ongoing transition of care scheduled for next week.   [name], RN, BSN, [employer] [facility] Care Management  [phone]

## 2016-03-04 ENCOUNTER — Encounter: Payer: Self-pay | Admitting: *Deleted

## 2016-03-08 ENCOUNTER — Other Ambulatory Visit: Payer: Self-pay | Admitting: Pharmacist

## 2016-03-08 NOTE — Patient Outreach (Signed)
[facility]) Care Management  03/08/2016  Adam Butler 03-28-1956 [id number]  This was a follow-up phone call to patient on 03/08/16 to follow-up patient assistance.    Patient answered call and verified his HIPAA details.  Patient states that he is continuing to work with his GI doctor for manufacturer patient assistance for rifaximin and cardiology office for ranolazine.     Patient reports that he decided to get his lactulose at [facility] as he reports it was "much more affordable there."  Inquired with patient if he would like to consider applying to [employer] patient assistance program to attempt to get help with his insulin---patient declines at this time.  He reports that now that he got his lactulose more affordable he thinks he will be okay going forward with his other medications.    Patient requests a follow-up call next month.   He denies other questions or concerns at this time.    Plan:  Will call patient next month per his request.    Encouraged patient to continue to work with his other physician offices with patient assistance for rifaximin and ranolazine as he reports he is trying.   Karrie Meres, PharmD, [place] [phone]

## 2016-03-10 ENCOUNTER — Encounter: Payer: Self-pay | Admitting: *Deleted

## 2016-03-10 ENCOUNTER — Other Ambulatory Visit: Payer: Self-pay | Admitting: *Deleted

## 2016-03-10 NOTE — Patient Outreach (Addendum)
Adam Butler [facility]) Care Management [place] Telephone Outreach, Transition of Care day 15 03/10/2016  Adam Butler January 03, 1956 [id number]  Adam Kail Breedloveis an 60 y.o.malemale previouslyfollowed by [place] after recent IP hospital visit June 7-9, 2017 for confusion, hepatic encephalopathy, CHF, and questionable diet/ medication compliance.Patient successfully met his goals and was transitioned to [place], with [place] referral for medication assistance. Unfortunately, patient was re-admitted August 13-16, 2017 for fever, chills, and was found to have sepsis/ bacteremia with probable GI source, and is now followed by [facility] for transition of care after most recent hospitalization. Patient noted to have history of liver cirrhosis/ NASH with portal HTN, obesity, and Type II DM.  HIPAA verified.  Today, Adam Butler continues to report that he is doing well, stating that he "is peachy keen" after his hospital visit. Adam Butler states that he currently has all of his medications, and we performed a medication reconciliation during our phone call today; patient was recently discharged from hospital and all medications were reviewed with him today.  Adam Butler states that his cardiologist "still hasn't decided" whether or not he should be on "Ranexa," but patient reports that he "feels better when I'm not taking it," and states "I hope they don't put me back on it."  Adam Butler continues to report that he is currently happy with the changes he has had made in obtaining his medications from [facility], and he denies concerns or problems, acknoeldging that he is staying in regular contact with Karrie Meres, [facility] pharmacist.   Adam Butler reports that he has continued monitoring and recording his daily weights and blood sugars, and states that his weight this morning was 272 lbs.  Adam Butler reports fasting blood sugars "in the  morning" of "120" and post-prandial sugars "in the evening" of "180-200."  Adam Butler voices no concerns, issues, problems, or needs today.  Adam Butler continues to wish to have [facility] telephonic follow up and does not believe he needs another in-home visit, given that he "is doing so well."  I confirmed that patient had my direct phone line, the 24-hour nurse line number, and the main number to [facility] CM.     Plan:  Adam Butler will take his medications as they are prescribed and will attend all provider appointments.  Adam Butler will continue to work with [place] as needed.  Adam Butler will continue to monitor and record his daily weights and blood sugars.  Adam Butler will contact his providers promptly for any concerns, issues, needs, or problems that arise.  Continued [facility] CM telephone outreach for ongoing transition of care scheduled for next week.   Adam Rack, RN, BSN, [employer] [facility] Care Management  [phone]

## 2016-03-11 ENCOUNTER — Ambulatory Visit: Payer: Self-pay | Admitting: *Deleted

## 2016-03-17 DIAGNOSIS — H2589 Other age-related cataract: Secondary | ICD-10-CM | POA: Diagnosis not present

## 2016-03-18 ENCOUNTER — Encounter: Payer: Self-pay | Admitting: *Deleted

## 2016-03-18 ENCOUNTER — Other Ambulatory Visit: Payer: Self-pay | Admitting: *Deleted

## 2016-03-18 LAB — HM DIABETES EYE EXAM

## 2016-03-18 NOTE — Patient Outreach (Signed)
[place] [facility]) Care Management [place] Telephone Outreach, Transition of Care day 23 03/18/2016  Adam Butler 1956/04/26 [id number]  Successful telephone outreach to Adam Butler, 59 y.o.malemale previouslyfollowed by [place] after recent IP hospital visit June 7-9, 2017 for confusion, hepatic encephalopathy, CHF, and questionable diet/ medication compliance.Patient successfully met his goals and was transitioned to [place], with [place] referral for medication assistance. Unfortunately, patient was re-admitted August 13-16, 2087fr fever, chills, and was found to have sepsis/ bacteremia of probable GI source, and is now followed by T[place]onfor transition of care after most recent hospitalization. Patient noted to have history of liver cirrhosis/ NASH with portal HTN, obesity, and Type II DM. HIPAA/ identity verified with patient.  Today, [name] to report that he is doing well after his most recent hospital visit and is "feeling good." [name] that he currently has all of his medications, and reports taking as they are prescribed.  TDes[facility]aks [facility] CenterPharmacist KKarrie Meresis actively involved in patient's care for medication assistance.   [name] that he has continued monitoring and recording his daily weights and blood sugars, but states that he does not currently have these values at the time of our conversation, as he is not at home at the present time.  I encouraged [name] continue monitoring and recording his weights/ blood sugars, as he has been, and he agreed to do so.  [name] no concerns, issues, problems, or needs today.  I let [name] that another [facility]Com[facility] RN CM would be contacting him by phone next week, and explained that it would be last TNaplestelephone outreach, if he continues to do well, to which he verbalized understanding and  agreement.  I confirmed that patient had the 24-hour nurse line number, and the main number to TRes[facility] HealthCM.    Plan:  [name] take his medications as they are prescribed and will attend all provider appointments.  [name] continue to work with TFinleyvilleas needed.  [name] continue to monitor and record his daily weights and blood sugars.  [name] contact his providers promptly for any concerns, issues, needs, or problems that arise.  Continued [facility] CM telephone outreach for final transition of care call next week.   LOneta Rack RN, BSN, CIntel CorporationTPresbyterian Hospital AscCare Management  [phone]

## 2016-03-21 ENCOUNTER — Encounter: Payer: Self-pay | Admitting: Internal Medicine

## 2016-03-23 ENCOUNTER — Other Ambulatory Visit: Payer: Self-pay

## 2016-03-23 NOTE — Patient Outreach (Signed)
[place] [facility]) Care Management  03/23/2016  Adam Butler 08/02/1955 [id number]   Assessment: 60 year old with recent admission for sepsis, heart failure. Member reports he is doing good. Denies any issues or concerns at this time. Acknowledges that he is working with [facility] pharmacist.  Member discussed daily weights. Member reports he is weighing himself twice a day. RNCM reinforced the importance of making sure he is weighing at least daily and the need to be consistent with the time of day he is weighing himself from day to day.   Plan: update assigned RNCM.  Covering RNCM: Thea Silversmith, RN, MSN, [place] Coordinator Cell: [phone]

## 2016-03-28 ENCOUNTER — Other Ambulatory Visit: Payer: Self-pay | Admitting: *Deleted

## 2016-03-28 ENCOUNTER — Encounter: Payer: Self-pay | Admitting: *Deleted

## 2016-03-28 NOTE — Patient Outreach (Signed)
[place] [facility]) Care Management [place] Telephone Outreach 03/28/2016  BRENDIN SITU 1956/03/26 [id number]  Successful telephone outreach to [name], 60 y.o.malemale previouslyfollowed by [place] after recent IP hospital visit June 7-9, 2017 for confusion, hepatic encephalopathy, CHF, and questionable diet/ medication compliance.Patient successfully met his goals and was transitioned to [place], with [employer] referral for medication assistance.  Patient was re-admitted August 13-16, 2015fr fever, chills, and was found to have sepsis/ bacteremia of probable GI source, and was again followed by [place] care, which has been successfully completed without hospital re-admission. Patient noted to have history of liver cirrhosis/ NASH with portal HTN, obesity, and Type II DM. HIPAA/ identity verified with patient.  Today, Mr. BRazcontinues to report that he is doing well after his most recent hospital visit and denies needs, concerns, issues or problems. Mr. BMitcheltreecontinues to report that he has his medications and is taking as prescribed.  Mr. BMaiselstated that "there is one medication I still don't have, but the doctor knows about it, and I am not worried about it because I feel better when I don't take it."  Patient is currently unable to remember the name of this medication and states, "it starts with an 'R.' "  TValle Vista Health SystemPharmacist KKarrie Meresis actively involved in patient's care for medication assistance.   Mr. BTapanesreports that he is doing well, and we discussed that he has met his previously established T[facility]Community CM goals; patient agrees that he is ready for discharge from [place].  Plan:  Will discharge Mr. BHarnoisfrom TTroyand will notify THegg Memorial Health Centerpharmacist KAnson Fretof same.   LOneta Rack RN, BSN, CTime WarnerTBaylor Medical Center At UptownCare Management  [phone]

## 2016-03-29 ENCOUNTER — Ambulatory Visit: Payer: Self-pay | Admitting: Pharmacist

## 2016-03-29 ENCOUNTER — Other Ambulatory Visit: Payer: Self-pay | Admitting: Pharmacist

## 2016-03-31 ENCOUNTER — Encounter: Payer: Self-pay | Admitting: Pharmacist

## 2016-03-31 NOTE — Patient Outreach (Signed)
[place] [facility]) Care Management  03/31/2016  Adam Butler [birth year] [id number]  Late Entry for 03/29/2016.    Follow-up call placed to patient on 03/29/16 and he verified his HIPAA details.  Patient states that he is continuing to work with his GI doctor for manufacturer assistance for rifaximin.  He states that since he got some refills at [facility], his medications have been more affordable, especially his lactulose, which is the medication he reported the most concern with.      Patient states at this time he still isn't interested in applying to [employer] Patient Assistance program.  He denies other pharmacy needs and denies other medication questions/concerns.    Plan:  Will close pharmacy case at this time as patient denies other needs and his medication assistance goal with regards to lactulose has been met.    Will update patient's PCP on pharmacy case closure via a case closure letter.   Karrie Meres, PharmD, [place] [phone]

## 2016-04-05 ENCOUNTER — Other Ambulatory Visit: Payer: Self-pay | Admitting: Internal Medicine

## 2016-04-18 DIAGNOSIS — H2589 Other age-related cataract: Secondary | ICD-10-CM | POA: Diagnosis not present

## 2016-04-22 ENCOUNTER — Other Ambulatory Visit: Payer: Self-pay | Admitting: Internal Medicine

## 2016-04-22 DIAGNOSIS — E785 Hyperlipidemia, unspecified: Secondary | ICD-10-CM

## 2016-04-25 ENCOUNTER — Encounter: Payer: Self-pay | Admitting: *Deleted

## 2016-04-28 ENCOUNTER — Ambulatory Visit
Admission: RE | Admit: 2016-04-28 | Discharge: 2016-04-28 | Disposition: A | Discharge status: Home / Self Care | Payer: PPO | Source: Ambulatory Visit | Attending: Ophthalmology | Admitting: Ophthalmology

## 2016-04-28 ENCOUNTER — Ambulatory Visit: Payer: PPO | Admitting: Anesthesiology

## 2016-04-28 ENCOUNTER — Encounter
Admission: RE | Disposition: A | Discharge status: Home / Self Care | Payer: Self-pay | Source: Ambulatory Visit | Attending: Ophthalmology

## 2016-04-28 DIAGNOSIS — E119 Type 2 diabetes mellitus without complications: Secondary | ICD-10-CM | POA: Insufficient documentation

## 2016-04-28 DIAGNOSIS — I252 Old myocardial infarction: Secondary | ICD-10-CM | POA: Insufficient documentation

## 2016-04-28 DIAGNOSIS — E7800 Pure hypercholesterolemia, unspecified: Secondary | ICD-10-CM | POA: Diagnosis not present

## 2016-04-28 DIAGNOSIS — I2510 Atherosclerotic heart disease of native coronary artery without angina pectoris: Secondary | ICD-10-CM | POA: Diagnosis not present

## 2016-04-28 DIAGNOSIS — Z79899 Other long term (current) drug therapy: Secondary | ICD-10-CM | POA: Insufficient documentation

## 2016-04-28 DIAGNOSIS — F17200 Nicotine dependence, unspecified, uncomplicated: Secondary | ICD-10-CM | POA: Diagnosis not present

## 2016-04-28 DIAGNOSIS — M1990 Unspecified osteoarthritis, unspecified site: Secondary | ICD-10-CM | POA: Diagnosis not present

## 2016-04-28 DIAGNOSIS — Z955 Presence of coronary angioplasty implant and graft: Secondary | ICD-10-CM | POA: Diagnosis not present

## 2016-04-28 DIAGNOSIS — I110 Hypertensive heart disease with heart failure: Secondary | ICD-10-CM | POA: Diagnosis not present

## 2016-04-28 DIAGNOSIS — K219 Gastro-esophageal reflux disease without esophagitis: Secondary | ICD-10-CM | POA: Insufficient documentation

## 2016-04-28 DIAGNOSIS — H2512 Age-related nuclear cataract, left eye: Secondary | ICD-10-CM | POA: Diagnosis not present

## 2016-04-28 DIAGNOSIS — I509 Heart failure, unspecified: Secondary | ICD-10-CM | POA: Diagnosis not present

## 2016-04-28 DIAGNOSIS — K7460 Unspecified cirrhosis of liver: Secondary | ICD-10-CM | POA: Insufficient documentation

## 2016-04-28 DIAGNOSIS — H2522 Age-related cataract, morgagnian type, left eye: Secondary | ICD-10-CM | POA: Insufficient documentation

## 2016-04-28 DIAGNOSIS — H2589 Other age-related cataract: Secondary | ICD-10-CM | POA: Diagnosis not present

## 2016-04-28 HISTORY — PX: CATARACT EXTRACTION W/PHACO: SHX586

## 2016-04-28 LAB — GLUCOSE, CAPILLARY: GLUCOSE-CAPILLARY: 138 mg/dL — AB (ref 65–99)

## 2016-04-28 SURGERY — PHACOEMULSIFICATION, CATARACT, WITH IOL INSERTION
Anesthesia: Monitor Anesthesia Care | Site: Eye | Laterality: Left | Wound class: Clean

## 2016-04-28 MED ORDER — MOXIFLOXACIN HCL 0.5 % OP SOLN
OPHTHALMIC | Status: AC
  Administered 2016-04-28: 1 [drp] via OPHTHALMIC
  Filled 2016-04-28: qty 3

## 2016-04-28 MED ORDER — SODIUM HYALURONATE 23 MG/ML IO SOLN
INTRAOCULAR | Status: DC | PRN
  Administered 2016-04-28: 0.6 mL via INTRAOCULAR

## 2016-04-28 MED ORDER — TRYPAN BLUE 0.06 % OP SOLN
OPHTHALMIC | Status: AC
  Filled 2016-04-28: qty 0.5

## 2016-04-28 MED ORDER — EPINEPHRINE PF 1 MG/ML IJ SOLN
INTRAMUSCULAR | Status: AC
  Filled 2016-04-28: qty 2

## 2016-04-28 MED ORDER — ARMC OPHTHALMIC DILATING DROPS
OPHTHALMIC | Status: AC
  Administered 2016-04-28: 1 via OPHTHALMIC
  Filled 2016-04-28: qty 0.4

## 2016-04-28 MED ORDER — MOXIFLOXACIN HCL 0.5 % OP SOLN
1.0000 [drp] | OPHTHALMIC | Status: AC
  Administered 2016-04-28 (×3): 1 [drp] via OPHTHALMIC

## 2016-04-28 MED ORDER — MIDAZOLAM HCL 5 MG/5ML IJ SOLN
INTRAMUSCULAR | Status: DC | PRN
  Administered 2016-04-28: 1 mg via INTRAVENOUS

## 2016-04-28 MED ORDER — NEOMYCIN-POLYMYXIN-DEXAMETH 3.5-10000-0.1 OP OINT
TOPICAL_OINTMENT | OPHTHALMIC | Status: AC
  Filled 2016-04-28: qty 3.5

## 2016-04-28 MED ORDER — CARBACHOL 0.01 % IO SOLN
INTRAOCULAR | Status: DC | PRN
  Administered 2016-04-28: 0.5 mL via INTRAOCULAR

## 2016-04-28 MED ORDER — EPINEPHRINE PF 1 MG/ML IJ SOLN
INTRAOCULAR | Status: DC | PRN
  Administered 2016-04-28: 250 mL via OPHTHALMIC

## 2016-04-28 MED ORDER — ARMC OPHTHALMIC DILATING DROPS
1.0000 "application " | OPHTHALMIC | Status: AC
  Administered 2016-04-28 (×3): 1 via OPHTHALMIC

## 2016-04-28 MED ORDER — NA HYALUR & NA CHOND-NA HYALUR 0.55-0.5 ML IO KIT
PACK | INTRAOCULAR | Status: AC
  Filled 2016-04-28: qty 1.05

## 2016-04-28 MED ORDER — CEFUROXIME OPHTHALMIC INJECTION 1 MG/0.1 ML
INJECTION | OPHTHALMIC | Status: AC
  Filled 2016-04-28: qty 0.1

## 2016-04-28 MED ORDER — LIDOCAINE HCL (PF) 4 % IJ SOLN
INTRAMUSCULAR | Status: AC
  Filled 2016-04-28: qty 5

## 2016-04-28 MED ORDER — SODIUM CHLORIDE 0.9 % IV SOLN
INTRAVENOUS | Status: DC
  Administered 2016-04-28: 08:00:00 via INTRAVENOUS

## 2016-04-28 MED ORDER — LIDOCAINE HCL (PF) 4 % IJ SOLN
INTRAMUSCULAR | Status: DC | PRN
  Administered 2016-04-28: 4 mL via OPHTHALMIC

## 2016-04-28 MED ORDER — CEFUROXIME OPHTHALMIC INJECTION 1 MG/0.1 ML
INJECTION | OPHTHALMIC | Status: DC | PRN
  Administered 2016-04-28: 1 mg via INTRACAMERAL

## 2016-04-28 MED ORDER — NEOMYCIN-POLYMYXIN-DEXAMETH 0.1 % OP OINT
TOPICAL_OINTMENT | OPHTHALMIC | Status: DC | PRN
  Administered 2016-04-28: 1 via OPHTHALMIC

## 2016-04-28 MED ORDER — NA CHONDROIT SULF-NA HYALURON 40-30 MG/ML IO SOLN
INTRAOCULAR | Status: DC | PRN
  Administered 2016-04-28: 0.5 mL via INTRAOCULAR

## 2016-04-28 MED ORDER — TRYPAN BLUE 0.06 % OP SOLN
OPHTHALMIC | Status: DC | PRN
  Administered 2016-04-28: 0.5 mL via INTRAOCULAR

## 2016-04-28 SURGICAL SUPPLY — 25 items
CANNULA ANT/CHMB 27G (MISCELLANEOUS) ×1 IMPLANT
CANNULA ANT/CHMB 27GA (MISCELLANEOUS) ×6 IMPLANT
CUP MEDICINE 2OZ PLAST GRAD ST (MISCELLANEOUS) ×3 IMPLANT
FILTER MILLEX .045 (MISCELLANEOUS) IMPLANT
FLTR MILLEX .045 (MISCELLANEOUS) ×3
GLOVE BIO SURGEON STRL SZ8 (GLOVE) ×3 IMPLANT
GLOVE BIOGEL M 6.5 STRL (GLOVE) ×3 IMPLANT
GLOVE SURG LX 7.5 STRW (GLOVE) ×2
GLOVE SURG LX STRL 7.5 STRW (GLOVE) ×1 IMPLANT
GOWN STRL REUS W/ TWL LRG LVL3 (GOWN DISPOSABLE) ×2 IMPLANT
GOWN STRL REUS W/TWL LRG LVL3 (GOWN DISPOSABLE) ×6
LENS IOL ACRSF IQ PC 17.0 (Intraocular Lens) IMPLANT
LENS IOL ACRYSOF IQ POST 17.0 (Intraocular Lens) ×3 IMPLANT
PACK CATARACT (MISCELLANEOUS) ×3 IMPLANT
PACK CATARACT BRASINGTON LX (MISCELLANEOUS) ×3 IMPLANT
PACK EYE AFTER SURG (MISCELLANEOUS) ×3 IMPLANT
SOL BSS BAG (MISCELLANEOUS) ×3
SOL PREP PVP 2OZ (MISCELLANEOUS) ×3
SOLUTION BSS BAG (MISCELLANEOUS) ×1 IMPLANT
SOLUTION PREP PVP 2OZ (MISCELLANEOUS) ×1 IMPLANT
SYR 3ML LL SCALE MARK (SYRINGE) ×5 IMPLANT
SYR 5ML LL (SYRINGE) ×3 IMPLANT
SYR TB 1ML 27GX1/2 LL (SYRINGE) ×3 IMPLANT
WATER STERILE IRR 250ML POUR (IV SOLUTION) ×3 IMPLANT
WIPE NON LINTING 3.25X3.25 (MISCELLANEOUS) ×3 IMPLANT

## 2016-04-28 NOTE — Op Note (Signed)
OPERATIVE NOTE  ERICKSON YAMASHIRO [id number] 04/28/2016   PREOPERATIVE DIAGNOSIS:  Left nuclear sclerotic CATARACT           Mature (Total) Cataract Left Eye H25.89   POSTOPERATIVE DIAGNOSIS: Left nuclear sclerotic CATARACT        Mature (Total) Cataract Left Eye H25.89   PROCEDURE:  Phacoemusification with posterior chamber intraocular lens placement of the right eye .  Vision Blue dye was used to stain the lens capsule.  LENS:   Implant Name Type Inv. Item Serial No. Manufacturer Lot No. LRB No. Used  IMPLANT LENS - [id number] Intraocular Lens IMPLANT LENS [id number] ALCON   Left 1    [id number] 17.0 D   ULTRASOUND TIME: 28 of 5 minutes 32 seconds, CDE 93.5  SURGEON:  Wyonia Hough, MD   ANESTHESIA:  Topical with tetracaine drops and 2% Xylocaine jelly, augmented with 1% preservative-free intracameral lidocaine.   COMPLICATIONS:  None.   DESCRIPTION OF PROCEDURE:  The patient was identified in the holding room and transported to the operating room and placed in the supine position under the operating microscope. Theleft eye was identified as the operative eye and it was prepped and draped in the usual sterile ophthalmic fashion.  A 1 millimeter clear-corneal paracentesis was made at the 1:30 position.  0.5 ml of preservative-free 1% lidocaine was injected into the anterior chamber. The anterior chamber was filled with Healon 5 viscoelastic.  A 2.4 millimeter keratome was used to make a near-clear corneal incision at the 10:30 position.  The anterior chamber was filled with Healon 5 viscoelastic.  Vision Blue dye was then injected under the viscoelastic to stain the lens capsule.  BSS was then used to wash the dye out.  Additional Healon 5 was placed into the anterior chamber.  A curvilinear capsulorrhexis was made with a cystotome and capsulorrhexis forceps.  Balanced salt solution was used to hydrodissect and hydrodelineate the nucleus.  Viscoat was then placed in the  anterior chamber.   Phacoemulsification was then used in stop and chop fashion to remove the lens nucleus and epinucleus.  The remaining cortex was then removed using the irrigation and aspiration handpiece. Provisc was then placed into the capsular bag to distend it for lens placement. An anterior capsule tear was noted at the 2:00 position.  The posterior capsule was stable for IOL placement.  No vitreous was present.  A 17.0 -diopter lens was then injected into the capsular bag.  The remaining viscoelastic was aspirated.   Wounds were hydrated with balanced salt solution.  The anterior chamber was inflated to a physiologic pressure with balanced salt solution. Cefuroxime 0.1 ml of a 57m/ml solution was injected into the anterior chamber for a dose of 1 mg of intracameral antibiotic at the completion of the case. Miostat was placed into the anterior chamber to constrict the pupil.  No wound leaks were noted.  Topical Vigamox drops and Maxitrol ointment were applied to the eye.  The patient was taken to the recovery room in stable condition without complications of anesthesia or surgery.  Burnie Hank 04/28/2016, 9:28 AM

## 2016-04-28 NOTE — H&P (Signed)
The History and Physical notes are on paper, have been signed, and are to be scanned. The patient remains stable and unchanged from the H&P.   Previous H&P reviewed, patient examined, and there are no changes.  Adam Butler 04/28/2016 8:13 AM

## 2016-04-28 NOTE — Discharge Instructions (Signed)
Eye Surgery Discharge Instructions  Expect mild scratchy sensation or mild soreness. DO NOT RUB YOUR EYE!  The day of surgery:  Minimal physical activity, but bed rest is not required  No reading, computer work, or close hand work  No bending, lifting, or straining.  May watch TV  For 24 hours:  No driving, legal decisions, or alcoholic beverages  Safety precautions  Eat anything you prefer: It is better to start with liquids, then soup then solid foods.  _____ Eye patch should be worn until postoperative exam tomorrow.  ____ Solar shield eyeglasses should be worn for comfort in the sunlight/patch while sleeping  Resume all regular medications including aspirin or Coumadin if these were discontinued prior to surgery. You may shower, bathe, shave, or wash your hair. Tylenol may be taken for mild discomfort.  Call your doctor if you experience significant pain, nausea, or vomiting, fever > 101 or other signs of infection. [phone] or [phone] Specific instructions:  Follow-up Information    Leandrew Koyanagi, MD .   Specialty:  Ophthalmology Why:  04/29/16 at 9:40 Contact information: [place] [address] [phone]          Eye Surgery Discharge Instructions  Expect mild scratchy sensation or mild soreness. DO NOT RUB YOUR EYE!  The day of surgery:  Minimal physical activity, but bed rest is not required  No reading, computer work, or close hand work  No bending, lifting, or straining.  May watch TV  For 24 hours:  No driving, legal decisions, or alcoholic beverages  Safety precautions  Eat anything you prefer: It is better to start with liquids, then soup then solid foods.  _____ Eye patch should be worn until postoperative exam tomorrow.  ____ Solar shield eyeglasses should be worn for comfort in the sunlight/patch while sleeping  Resume all regular medications including aspirin or Coumadin if these were  discontinued prior to surgery. You may shower, bathe, shave, or wash your hair. Tylenol may be taken for mild discomfort.  Call your doctor if you experience significant pain, nausea, or vomiting, fever > 101 or other signs of infection. [phone] or [phone] Specific instructions:  Follow-up Information    Leandrew Koyanagi, MD .   Specialty:  Ophthalmology Why:  04/29/16 at 9:40 Contact information: [address]   [place] [address] [phone]

## 2016-04-28 NOTE — Transfer of Care (Signed)
Immediate Anesthesia Transfer of Care Note  Patient: Adam Butler  Procedure(s) Performed: Procedure(s) with comments: CATARACT EXTRACTION PHACO AND INTRAOCULAR LENS PLACEMENT (IOC) (Left) - Lot# [id number] H [place]: 05:31.7 AP%: 28.2 CDE: 93.48   Patient Location: PACU  Anesthesia Type:MAC  Level of Consciousness: awake, alert , oriented and patient cooperative  Airway & Oxygen Therapy: Patient Spontanous Breathing  Post-op Assessment: Report given to RN, Post -op Vital signs reviewed and stable and Patient moving all extremities X 4  Post vital signs: Reviewed and stable  Last Vitals:  Vitals:   04/28/16 0718  BP: (!) 98/58  Resp: 16  Temp: 36.5 C    Last Pain:  Vitals:   04/28/16 0718  TempSrc: Oral         Complications: No apparent anesthesia complications

## 2016-04-28 NOTE — Anesthesia Postprocedure Evaluation (Signed)
Anesthesia Post Note  Patient: Adam Butler  Procedure(s) Performed: Procedure(s) (LRB): CATARACT EXTRACTION PHACO AND INTRAOCULAR LENS PLACEMENT (IOC) (Left)  Patient location during evaluation: PACU Anesthesia Type: MAC Level of consciousness: awake and alert and patient cooperative Pain management: pain level controlled Vital Signs Assessment: post-procedure vital signs reviewed and stable Respiratory status: spontaneous breathing, nonlabored ventilation and respiratory function stable Cardiovascular status: stable and blood pressure returned to baseline Anesthetic complications: no    Last Vitals:  Vitals:   04/28/16 0718  BP: (!) 98/58  Resp: 16  Temp: 36.5 C    Last Pain:  Vitals:   04/28/16 0718  TempSrc: Shane Crutch A

## 2016-04-28 NOTE — Anesthesia Preprocedure Evaluation (Signed)
Anesthesia Evaluation  Patient identified by MRN, date of birth, ID band Patient awake    Reviewed: Allergy & Precautions, NPO status , Patient's Chart, lab work & pertinent test results, reviewed documented beta blocker date and time   Airway Mallampati: III  TM Distance: >3 FB     Dental  (+) Chipped   Pulmonary shortness of breath, Current Smoker,           Cardiovascular hypertension, Pt. on medications + angina + CAD, + Past MI and +CHF  + dysrhythmias      Neuro/Psych    GI/Hepatic GERD  Controlled,(+) Cirrhosis       ,   Endo/Other  diabetes, Type 2  Renal/GU      Musculoskeletal  (+) Arthritis ,   Abdominal   Peds  Hematology   Anesthesia Other Findings PFO. Hx of SVT.  Reproductive/Obstetrics                             Anesthesia Physical Anesthesia Plan  ASA: III  Anesthesia Plan: MAC   Post-op Pain Management:    Induction:   Airway Management Planned:   Additional Equipment:   Intra-op Plan:   Post-operative Plan:   Informed Consent: I have reviewed the patients History and Physical, chart, labs and discussed the procedure including the risks, benefits and alternatives for the proposed anesthesia with the patient or authorized representative who has indicated his/her understanding and acceptance.     Plan Discussed with: CRNA  Anesthesia Plan Comments:         Anesthesia Quick Evaluation

## 2016-04-29 ENCOUNTER — Other Ambulatory Visit: Payer: Self-pay | Admitting: Internal Medicine

## 2016-05-04 ENCOUNTER — Other Ambulatory Visit: Payer: Self-pay | Admitting: Internal Medicine

## 2016-05-12 IMAGING — CR DG ABD PORTABLE 1V
1 series · 1 of 1 positions shown · non-contrast
Comparison: None.

CLINICAL DATA: Feeding tube placement.

EXAM:
PORTABLE ABDOMEN - 1 VIEW

[AP]
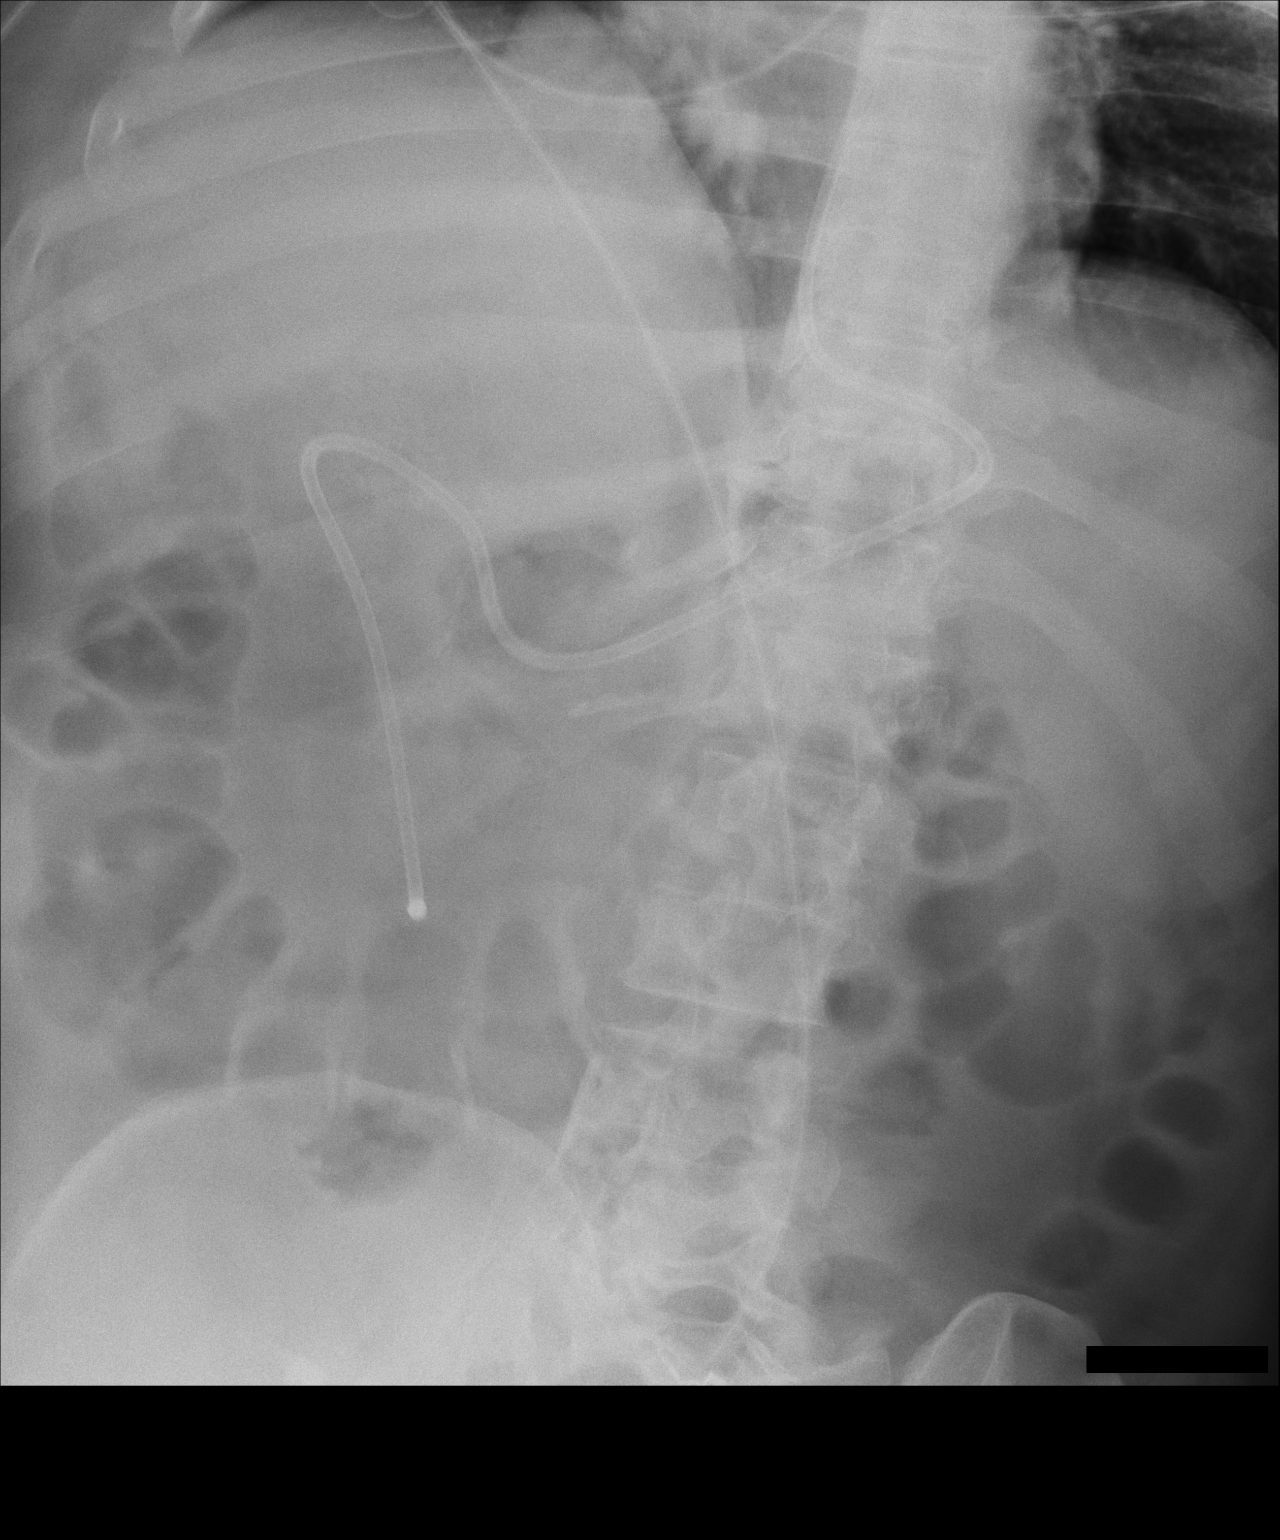

[1 of 1 positions shown; findings below may reference images not displayed]

FINDINGS: A small bore feeding tube is identified with tip overlying the
proximal-mid duodenum.

The bowel gas pattern is unremarkable.
IMPRESSION: Small bore feeding tube with tip overlying the proximal -mid
duodenum.

## 2016-05-18 ENCOUNTER — Telehealth: Payer: Self-pay | Admitting: Internal Medicine

## 2016-05-18 MED ORDER — NADOLOL 40 MG PO TABS: 40.0000 mg | ORAL_TABLET | Freq: Every day | ORAL | 0 refills | Status: DC

## 2016-05-18 NOTE — Telephone Encounter (Signed)
Patient is overdue for cirrhosis follow up. He has been scheduled to see Nicoletta Ba, PA-C on 05/30/16 and I will send a refill of nadolol until then. Patient verbalizes understanding.

## 2016-05-25 ENCOUNTER — Other Ambulatory Visit: Payer: Self-pay

## 2016-05-25 DIAGNOSIS — K7460 Unspecified cirrhosis of liver: Secondary | ICD-10-CM

## 2016-05-26 ENCOUNTER — Telehealth: Payer: Self-pay | Admitting: Internal Medicine

## 2016-05-26 DIAGNOSIS — Z794 Long term (current) use of insulin: Principal | ICD-10-CM

## 2016-05-26 DIAGNOSIS — E119 Type 2 diabetes mellitus without complications: Secondary | ICD-10-CM

## 2016-05-26 MED ORDER — INSULIN REGULAR HUMAN 100 UNIT/ML IJ SOLN: INTRAMUSCULAR | 1 refills | Status: DC

## 2016-05-26 NOTE — Telephone Encounter (Signed)
Pt called and now needs a prescription for  insulin regular (NOVOLIN R,HUMULIN R) 100 units/mL injection  Called in to [facility].  His insurancehas changed and he gets it cheaper if he has a prescription.  Best number to call pt is [phone]

## 2016-05-26 NOTE — Telephone Encounter (Signed)
Please send in Novolin

## 2016-05-26 NOTE — Addendum Note (Signed)
Addended by: Lurlean Nanny on: 05/26/2016 04:44 PM   Modules accepted: Orders

## 2016-05-30 ENCOUNTER — Other Ambulatory Visit (INDEPENDENT_AMBULATORY_CARE_PROVIDER_SITE_OTHER): Payer: PPO

## 2016-05-30 ENCOUNTER — Ambulatory Visit (INDEPENDENT_AMBULATORY_CARE_PROVIDER_SITE_OTHER): Payer: PPO | Admitting: Physician Assistant

## 2016-05-30 ENCOUNTER — Encounter: Payer: Self-pay | Admitting: Physician Assistant

## 2016-05-30 VITALS — BP 130/70 | HR 68 | Ht 67.0 in | Wt 283.0 lb

## 2016-05-30 DIAGNOSIS — K7460 Unspecified cirrhosis of liver: Secondary | ICD-10-CM | POA: Diagnosis not present

## 2016-05-30 DIAGNOSIS — K7581 Nonalcoholic steatohepatitis (NASH): Secondary | ICD-10-CM

## 2016-05-30 DIAGNOSIS — I8500 Esophageal varices without bleeding: Secondary | ICD-10-CM

## 2016-05-30 LAB — COMPREHENSIVE METABOLIC PANEL
ALT: 34 U/L (ref 0–53)
AST: 55 U/L — AB (ref 0–37)
Albumin: 2.8 g/dL — ABNORMAL LOW (ref 3.5–5.2)
Alkaline Phosphatase: 177 U/L — ABNORMAL HIGH (ref 39–117)
BUN: 6 mg/dL (ref 6–23)
CALCIUM: 8.5 mg/dL (ref 8.4–10.5)
CO2: 25 meq/L (ref 19–32)
Chloride: 104 mEq/L (ref 96–112)
Creatinine, Ser: 0.64 mg/dL (ref 0.40–1.50)
GFR: 135.48 mL/min (ref >60.00)
Glucose, Bld: 295 mg/dL — ABNORMAL HIGH (ref 70–99)
Potassium: 3.3 mEq/L — ABNORMAL LOW (ref 3.5–5.1)
Sodium: 136 mEq/L (ref 135–145)
Total Bilirubin: 1.9 mg/dL — ABNORMAL HIGH (ref 0.2–1.2)
Total Protein: 6.7 g/dL (ref 6.0–8.3)

## 2016-05-30 LAB — CBC WITH DIFFERENTIAL/PLATELET
BASOS PCT: 0.3 % (ref 0.0–3.0)
Basophils Absolute: 0 10*3/uL (ref 0.0–0.1)
EOS ABS: 0.3 10*3/uL (ref 0.0–0.7)
Eosinophils Relative: 5.5 % — ABNORMAL HIGH (ref 0.0–5.0)
HEMATOCRIT: 37.1 % — AB (ref 39.0–52.0)
Hemoglobin: 12.7 g/dL — ABNORMAL LOW (ref 13.0–17.0)
LYMPHS PCT: 26.7 % (ref 12.0–46.0)
Lymphs Abs: 1.5 10*3/uL (ref 0.7–4.0)
MCHC: 34.2 g/dL (ref 30.0–36.0)
MCV: 95.9 fl (ref 78.0–100.0)
Monocytes Absolute: 0.6 10*3/uL (ref 0.1–1.0)
Monocytes Relative: 10.2 % (ref 3.0–12.0)
NEUTROS ABS: 3.2 10*3/uL (ref 1.4–7.7)
Neutrophils Relative %: 57.3 % (ref 43.0–77.0)
PLATELETS: 70 10*3/uL — AB (ref 150.0–400.0)
RBC: 3.87 Mil/uL — ABNORMAL LOW (ref 4.22–5.81)
RDW: 16.5 % — AB (ref 11.5–15.5)
WBC: 5.6 10*3/uL (ref 4.0–10.5)

## 2016-05-30 LAB — PROTIME-INR
INR: 1.3 ratio — ABNORMAL HIGH (ref 0.8–1.0)
PROTHROMBIN TIME: 14 s — AB (ref 9.6–13.1)

## 2016-05-30 MED ORDER — NADOLOL 40 MG PO TABS: 40.0000 mg | ORAL_TABLET | Freq: Every day | ORAL | 6 refills | Status: DC

## 2016-05-30 MED ORDER — FUROSEMIDE 40 MG PO TABS: 40.0000 mg | ORAL_TABLET | Freq: Every morning | ORAL | 3 refills | Status: DC

## 2016-05-30 MED ORDER — SPIRONOLACTONE 25 MG PO TABS: 12.5000 mg | ORAL_TABLET | Freq: Every day | ORAL | 6 refills | Status: DC

## 2016-05-30 MED ORDER — LACTULOSE 10 GM/15ML PO SOLN: ORAL | 6 refills | Status: DC

## 2016-05-30 NOTE — Progress Notes (Addendum)
Subjective:    Patient ID: Adam Butler, male    DOB: Jun 03, 1956, 60 y.o.   MRN: [id number]  HPI Haru is a 60 year old white male known to Dr. 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 He comes in today for management of cirrhosis. He has had 2 recent hospital hospitalizations. He was hospitalized in June 2017 with hepatic encephalopathy and then in August 2017 with congestive heart failure however that visit was also complicated by a Klebsiella bacteremia and He was not seen by GI during that admission but was seen by infectious disease who felt he may have had translocation of gut bacteria causing the bacteremia. He says she's been feeling well recently, has been taking his medications as instructed and has been very compliant with lactulose. Currently taking 3 doses daily and having at least 6 bowel movements per day. He has no complaints of fluid overload or abdominal distention. He says he was unable to afford to stay on Xifaxan as it was costing $400 per month. Last EGD June 2015 showed 3 columns of small varices -has been on nadolol 40 mg by mouth daily Review of MRI done in August 2017 showed cirrhosis without hepatic lesion, splenomegaly and large gastric and distal esophageal varices. There was a small amount of ascites, gallstones. Portal vein felt patent.  Review of Systems Pertinent positive and negative review of systems were noted in the above HPI section.  All other review of systems was otherwise negative.  Outpatient Encounter Prescriptions as of 05/30/2016  Medication Sig  . aspirin 81 MG tablet Take 81 mg by mouth daily.   Marland Kitchen atorvastatin (LIPITOR) 10 MG tablet TAKE 1 TABLET (10 MG TOTAL) BY MOUTH DAILY.  . furosemide (LASIX) 40 MG tablet Take 1 tablet (40 mg total) by mouth every morning.  . insulin regular (NOVOLIN R,HUMULIN R) 250 units/2.63m (100 units/mL) injection According to sliding scale: 151-200: 2 units, 201-250 4 units,  251-300 6 units, 301-350 8 units. If > 400 call me.  . lactulose (CHRONULAC) 10 GM/15ML solution TAKE 45 ML 3 TIMES A DAY ON SCHEDULE. IF NO BM BY 1PM TAKE ADDT'L 45 ML DOSE  . metFORMIN (GLUCOPHAGE) 1000 MG tablet Take 1,000 mg by mouth 2 (two) times daily.  . Multiple Vitamin (MULTIVITAMIN) capsule Take 1 capsule by mouth daily.    . nadolol (CORGARD) 40 MG tablet Take 1 tablet (40 mg total) by mouth daily.  . nitroGLYCERIN (NITROSTAT) 0.4 MG SL tablet Place 1 tablet (0.4 mg total) under the tongue every 5 (five) minutes as needed for chest pain (chest pain).  . ranitidine (ZANTAC) 150 MG tablet Take 150 mg by mouth daily.  .Marland Kitchenspironolactone (ALDACTONE) 25 MG tablet Take 0.5 tablets (12.5 mg total) by mouth daily.  . [DISCONTINUED] furosemide (LASIX) 40 MG tablet TAKE 1 TABLET EVERY MORNING  . [DISCONTINUED] lactulose (CHRONULAC) 10 GM/15ML solution TAKE 45 ML 3 TIMES A DAY ON SCHEDULE. IF NO BM BY 1PM TAKE ADDT'L 45 ML DOSE  . [DISCONTINUED] nadolol (CORGARD) 40 MG tablet Take 1 tablet (40 mg total) by mouth daily.  . [DISCONTINUED] spironolactone (ALDACTONE) 25 MG tablet Take 0.5 tablets (12.5 mg total) by mouth daily.  . [DISCONTINUED] rifaximin (XIFAXAN) 550 MG TABS tablet Take 1 tablet (550 mg total) by mouth 2 (two) times daily.   No facility-administered encounter medications on file as of 05/30/2016.    Allergies  Allergen Reactions  . Isosorbide Nitrate Other (See Comments)    Nose bleeds  Patient Active Problem List   Diagnosis Date Noted  . Portal vein thrombosis   . Bacteremia due to Klebsiella pneumoniae 02/22/2016  . Hepatic encephalopathy ([place]) 12/16/2015  . Insulin dependent diabetes mellitus ([place])   . GERD (gastroesophageal reflux disease) 09/29/2015  . Type 2 diabetes mellitus ([place]) 08/18/2015  . CAD in native artery   . Encephalopathy, hepatic ([place])   . Liver cirrhosis secondary to NASH ([place]) 06/06/2015  . Tobacco abuse 05/04/2015  . PFO (patent foramen ovale):  Per TEE 04/20/2015 04/20/2015  . Severe sepsis ([place]) 12/29/2013  . Esophageal varices ([place]) 08/16/2010  . HLD (hyperlipidemia) 08/02/2010  . DIASTOLIC HEART FAILURE, CHRONIC 08/02/2010   Social History   Social History  . Marital status: Married    Spouse name: N/A  . Number of children: 3  . Years of education: N/A   Occupational History  . Unemployed Other    Worked in maintenance prior   Social History Main Topics  . Smoking status: Current Every Day Smoker    Packs/day: 0.25    Years: 36.00    Types: E-cigarettes  . Smokeless tobacco: Never Used     Comment: uses vapor cigarettes (2016 )  . Alcohol use No  . Drug use: No  . Sexual activity: No   Other Topics Concern  . Not on file   Social History Narrative   Married   Gets regular exercise: walking    Mr. Pickerill family history includes COPD in his father and mother; Diabetes in his brother and father; Heart attack in his brother and father; Heart attack (age of onset: 92) in his brother.      Objective:    Vitals:   05/30/16 1324  BP: 130/70  Pulse: 68    Physical Exam well-developed elderly white male in no acute distress, pleasant blood pressure 130/70 pulse 60, BMI 44.3, weight 283. 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 Hilarie Fredrickson. This will be done at the hospital given MRI finding of large gastric and distal esophageal varices. Procedure discussed in detail with patient including risks and benefits and he is agreeable to proceed. continue nadolol 40 mg po daily continue lactulose 45 mL 3 times a day mg by mouth daily Continue Lasix 40 mg by mouth every morning and Aldactone 12.5 mg daily.   Ewell Benassi Genia Harold PA-C 05/30/2016   Cc: [name], NP   Addendum: Reviewed and agree with ongoing management. Jerene Bears, MD

## 2016-05-30 NOTE — Patient Instructions (Signed)
Please go to the basement level to have your labs drawn.  You have been scheduled for an endoscopy. Please follow written instructions given to you at your visit today. If you use inhalers (even only as needed), please bring them with you on the day of your procedure.  We sent prescriptions to [facility]

## 2016-05-30 NOTE — Telephone Encounter (Signed)
Rx called in to pharmacy. 

## 2016-05-31 ENCOUNTER — Other Ambulatory Visit: Payer: Self-pay | Admitting: *Deleted

## 2016-05-31 DIAGNOSIS — K7581 Nonalcoholic steatohepatitis (NASH): Principal | ICD-10-CM

## 2016-05-31 DIAGNOSIS — K7460 Unspecified cirrhosis of liver: Secondary | ICD-10-CM

## 2016-05-31 LAB — AFP TUMOR MARKER: AFP TUMOR MARKER: 4.4 ng/mL (ref <6.1)

## 2016-06-09 DIAGNOSIS — Z961 Presence of intraocular lens: Secondary | ICD-10-CM | POA: Diagnosis not present

## 2016-06-12 IMAGING — US US ABDOMEN LIMITED
1 series · 6 of 6 positions shown · non-contrast
Comparison: None.

CLINICAL DATA: Cirrhosis, encephalopathy, fever. Request is made
for paracentesis.

EXAM:
LIMITED ABDOMEN ULTRASOUND FOR ASCITES
TECHNIQUE: Limited ultrasound survey for ascites was performed in all four
abdominal quadrants.There is no significant ascites present on
today's exam.

[Series 1: us abdomen limited · 0.28mm/px · 6 of 6 slices shown]
[im 1/6]
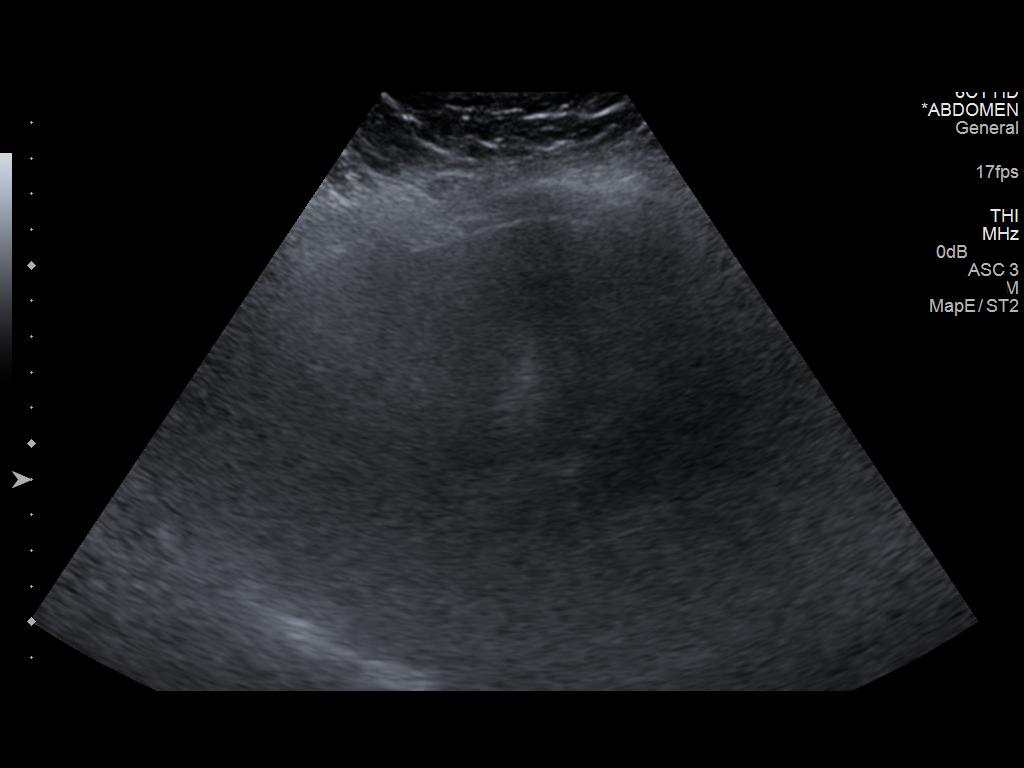
[im 2/6]
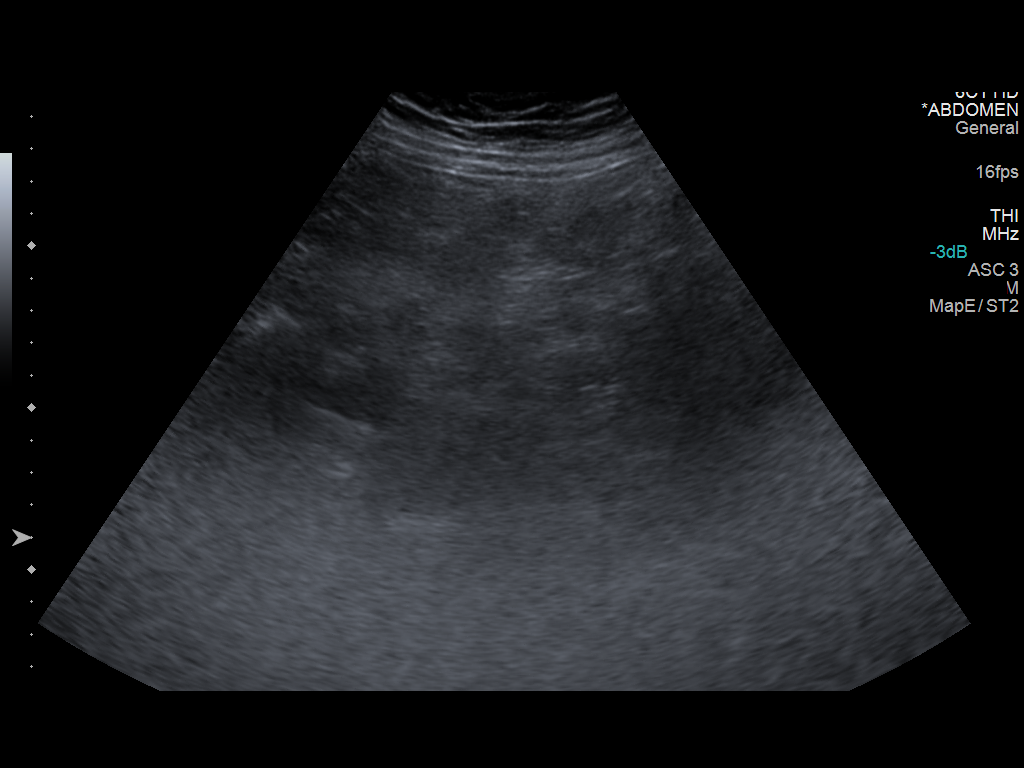
[im 3/6]
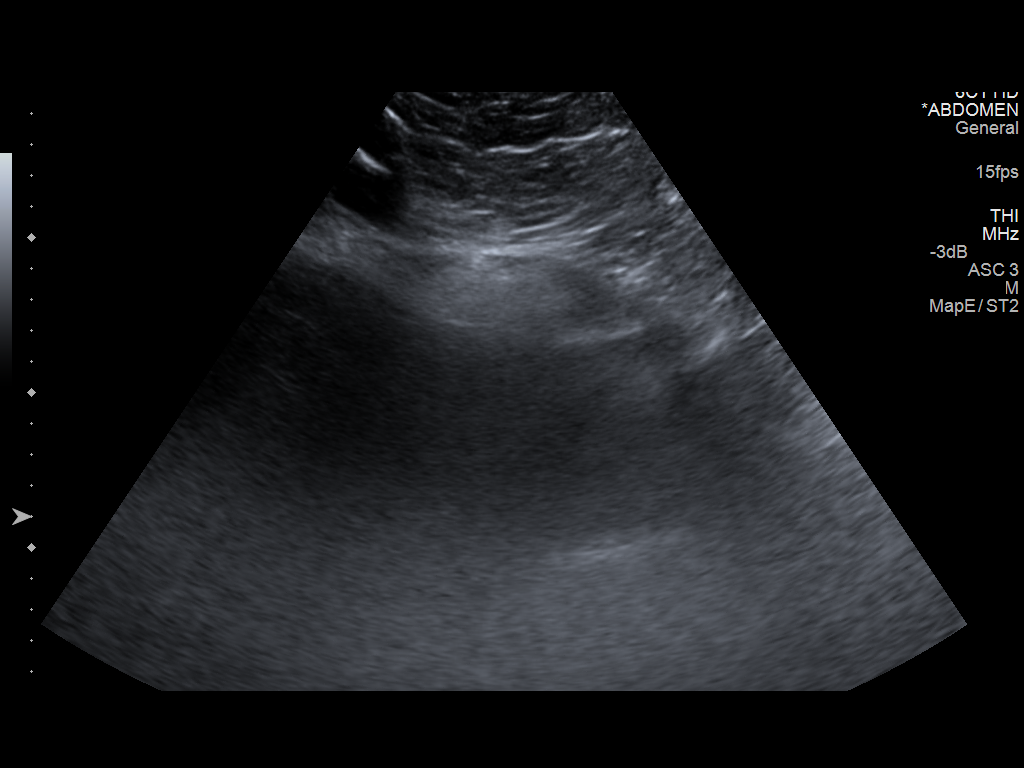
[im 4/6]
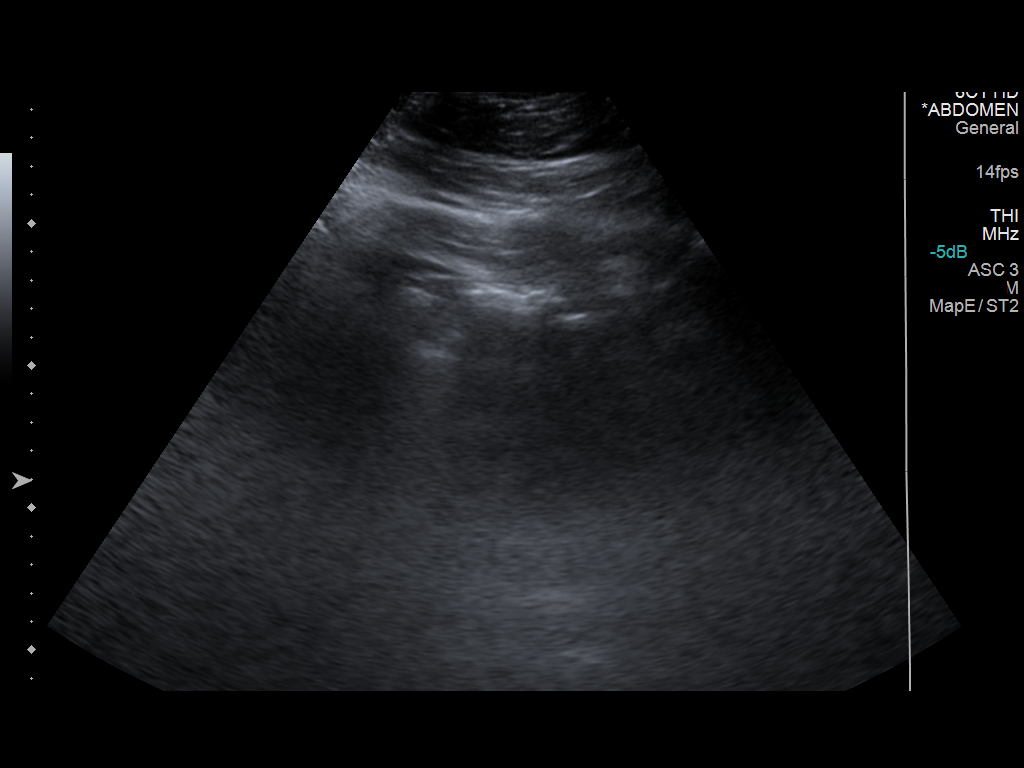
[im 5/6]
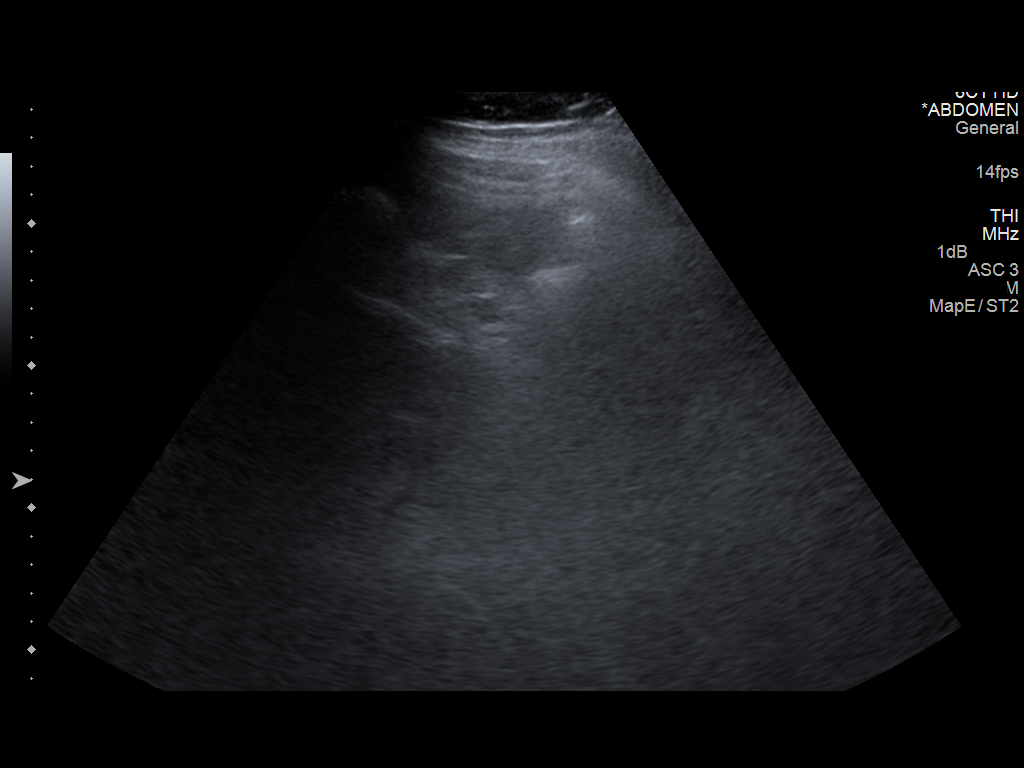
[im 6/6]
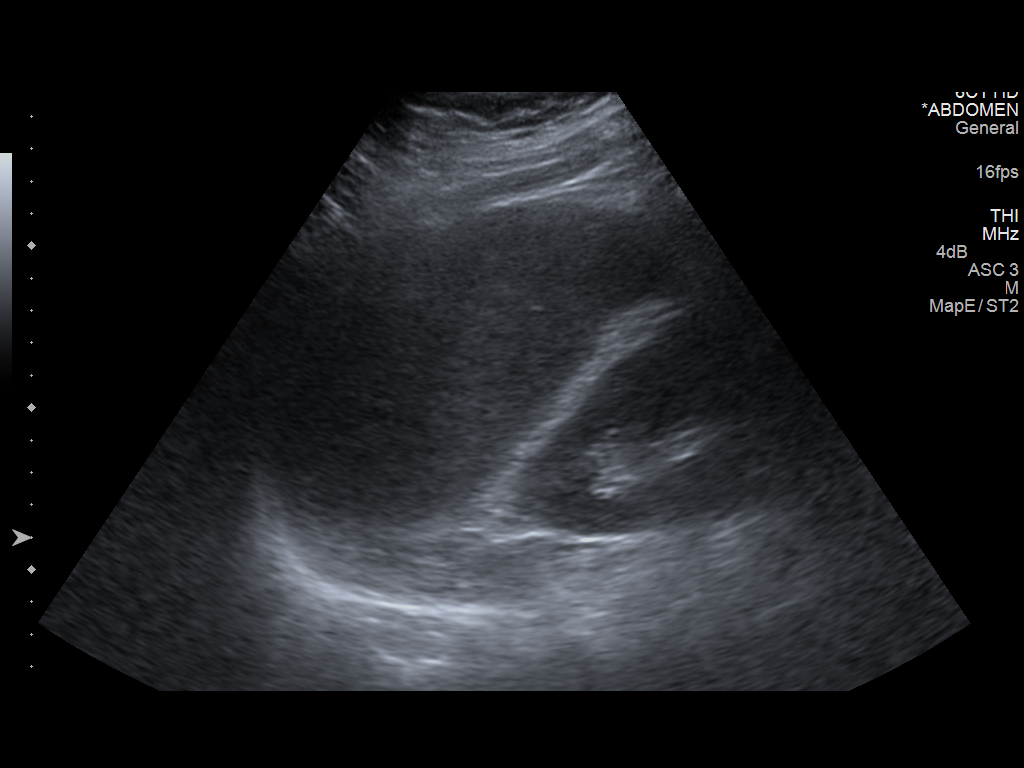

[6 of 6 positions shown; findings below may reference images not displayed]

FINDINGS: No significant ascites present on limited ultrasound of abdomen in
all 4 quadrants
IMPRESSION: No significant ascites.  Paracentesis not performed.

## 2016-06-15 ENCOUNTER — Other Ambulatory Visit (INDEPENDENT_AMBULATORY_CARE_PROVIDER_SITE_OTHER): Payer: PPO

## 2016-06-15 DIAGNOSIS — K7581 Nonalcoholic steatohepatitis (NASH): Secondary | ICD-10-CM

## 2016-06-15 DIAGNOSIS — K7460 Unspecified cirrhosis of liver: Secondary | ICD-10-CM | POA: Diagnosis not present

## 2016-06-15 LAB — BASIC METABOLIC PANEL
BUN: 8 mg/dL (ref 6–23)
CALCIUM: 9 mg/dL (ref 8.4–10.5)
CO2: 30 meq/L (ref 19–32)
CREATININE: 0.83 mg/dL (ref 0.40–1.50)
Chloride: 99 mEq/L (ref 96–112)
GFR: 100.35 mL/min (ref >60.00)
GLUCOSE: 379 mg/dL — AB (ref 70–99)
Potassium: 4 mEq/L (ref 3.5–5.1)
Sodium: 135 mEq/L (ref 135–145)

## 2016-06-15 LAB — AMMONIA: Ammonia: 79 umol/L — ABNORMAL HIGH (ref 11–35)

## 2016-06-17 ENCOUNTER — Other Ambulatory Visit: Payer: Self-pay

## 2016-06-27 ENCOUNTER — Telehealth: Payer: Self-pay | Admitting: *Deleted

## 2016-06-27 NOTE — Telephone Encounter (Signed)
I have not seen it as of yet, and I currently have nothing in my inbox, except labs

## 2016-06-27 NOTE — Telephone Encounter (Signed)
Adam Butler left a voicemail that they had faxed over a form for completion for insulin supplies, diabetic testing supplies and sodium capsules. Adam Butler wanted to make sure that you received the form and requested a call back to confirm that you got it and turn around time. Reference # [id number]

## 2016-07-01 NOTE — Telephone Encounter (Signed)
I spoke to pt and he states he DOES NOT want to order from any diabetic supply company

## 2016-07-06 ENCOUNTER — Encounter (HOSPITAL_COMMUNITY): Payer: Self-pay

## 2016-07-12 ENCOUNTER — Ambulatory Visit (HOSPITAL_COMMUNITY): Payer: PPO | Admitting: Anesthesiology

## 2016-07-12 ENCOUNTER — Encounter (HOSPITAL_COMMUNITY): Payer: Self-pay | Admitting: Anesthesiology

## 2016-07-12 ENCOUNTER — Ambulatory Visit (HOSPITAL_COMMUNITY)
Admission: RE | Admit: 2016-07-12 | Discharge: 2016-07-12 | Disposition: A | Discharge status: Home / Self Care | Payer: PPO | Source: Ambulatory Visit | Attending: Internal Medicine | Admitting: Internal Medicine

## 2016-07-12 ENCOUNTER — Encounter (HOSPITAL_COMMUNITY)
Admission: RE | Disposition: A | Discharge status: Home / Self Care | Payer: Self-pay | Source: Ambulatory Visit | Attending: Internal Medicine

## 2016-07-12 DIAGNOSIS — I10 Essential (primary) hypertension: Secondary | ICD-10-CM | POA: Diagnosis not present

## 2016-07-12 DIAGNOSIS — I8500 Esophageal varices without bleeding: Secondary | ICD-10-CM

## 2016-07-12 DIAGNOSIS — I8510 Secondary esophageal varices without bleeding: Secondary | ICD-10-CM | POA: Diagnosis not present

## 2016-07-12 DIAGNOSIS — K7460 Unspecified cirrhosis of liver: Secondary | ICD-10-CM | POA: Diagnosis not present

## 2016-07-12 DIAGNOSIS — Z955 Presence of coronary angioplasty implant and graft: Secondary | ICD-10-CM | POA: Diagnosis not present

## 2016-07-12 DIAGNOSIS — F17210 Nicotine dependence, cigarettes, uncomplicated: Secondary | ICD-10-CM | POA: Diagnosis not present

## 2016-07-12 DIAGNOSIS — Q211 Atrial septal defect: Secondary | ICD-10-CM | POA: Diagnosis not present

## 2016-07-12 DIAGNOSIS — K3189 Other diseases of stomach and duodenum: Secondary | ICD-10-CM

## 2016-07-12 DIAGNOSIS — I252 Old myocardial infarction: Secondary | ICD-10-CM | POA: Insufficient documentation

## 2016-07-12 DIAGNOSIS — I2510 Atherosclerotic heart disease of native coronary artery without angina pectoris: Secondary | ICD-10-CM | POA: Insufficient documentation

## 2016-07-12 DIAGNOSIS — Z6841 Body Mass Index (BMI) 40.0 and over, adult: Secondary | ICD-10-CM | POA: Insufficient documentation

## 2016-07-12 DIAGNOSIS — Z13810 Encounter for screening for upper gastrointestinal disorder: Secondary | ICD-10-CM | POA: Diagnosis not present

## 2016-07-12 DIAGNOSIS — E1143 Type 2 diabetes mellitus with diabetic autonomic (poly)neuropathy: Secondary | ICD-10-CM | POA: Diagnosis not present

## 2016-07-12 DIAGNOSIS — K766 Portal hypertension: Secondary | ICD-10-CM | POA: Diagnosis not present

## 2016-07-12 DIAGNOSIS — K7581 Nonalcoholic steatohepatitis (NASH): Secondary | ICD-10-CM

## 2016-07-12 DIAGNOSIS — I509 Heart failure, unspecified: Secondary | ICD-10-CM | POA: Diagnosis not present

## 2016-07-12 DIAGNOSIS — K3184 Gastroparesis: Secondary | ICD-10-CM | POA: Diagnosis not present

## 2016-07-12 DIAGNOSIS — I110 Hypertensive heart disease with heart failure: Secondary | ICD-10-CM | POA: Diagnosis not present

## 2016-07-12 HISTORY — PX: ESOPHAGOGASTRODUODENOSCOPY (EGD) WITH PROPOFOL: SHX5813

## 2016-07-12 LAB — GLUCOSE, CAPILLARY: GLUCOSE-CAPILLARY: 161 mg/dL — AB (ref 65–99)

## 2016-07-12 SURGERY — ESOPHAGOGASTRODUODENOSCOPY (EGD) WITH PROPOFOL
Anesthesia: Monitor Anesthesia Care

## 2016-07-12 MED ORDER — ONDANSETRON HCL 4 MG/2ML IJ SOLN
INTRAMUSCULAR | Status: AC
  Filled 2016-07-12: qty 2

## 2016-07-12 MED ORDER — MIDAZOLAM HCL 5 MG/5ML IJ SOLN
INTRAMUSCULAR | Status: DC | PRN
  Administered 2016-07-12: 2 mg via INTRAVENOUS

## 2016-07-12 MED ORDER — PROPOFOL 10 MG/ML IV BOLUS
INTRAVENOUS | Status: AC
  Filled 2016-07-12: qty 40

## 2016-07-12 MED ORDER — MIDAZOLAM HCL 2 MG/2ML IJ SOLN
INTRAMUSCULAR | Status: AC
  Filled 2016-07-12: qty 2

## 2016-07-12 MED ORDER — ONDANSETRON HCL 4 MG/2ML IJ SOLN
INTRAMUSCULAR | Status: DC | PRN
  Administered 2016-07-12: 4 mg via INTRAVENOUS

## 2016-07-12 MED ORDER — SODIUM CHLORIDE 0.9 % IV SOLN: INTRAVENOUS | Status: DC

## 2016-07-12 MED ORDER — PROPOFOL 500 MG/50ML IV EMUL
INTRAVENOUS | Status: DC | PRN
  Administered 2016-07-12: 50 mg via INTRAVENOUS

## 2016-07-12 MED ORDER — LACTATED RINGERS IV SOLN
INTRAVENOUS | Status: DC
  Administered 2016-07-12: 1000 mL via INTRAVENOUS
  Administered 2016-07-12: 09:00:00 via INTRAVENOUS

## 2016-07-12 MED ORDER — GLYCOPYRROLATE 0.2 MG/ML IJ SOLN
INTRAMUSCULAR | Status: DC | PRN
  Administered 2016-07-12: 0.2 mg via INTRAVENOUS

## 2016-07-12 MED ORDER — PROPOFOL 500 MG/50ML IV EMUL
INTRAVENOUS | Status: DC | PRN
  Administered 2016-07-12: 100 ug/kg/min via INTRAVENOUS

## 2016-07-12 SURGICAL SUPPLY — 15 items

## 2016-07-12 NOTE — Discharge Instructions (Signed)

## 2016-07-12 NOTE — Op Note (Signed)
[facility] Patient Name: Adam Butler Procedure Date: 07/12/2016 MRN: [id number] Attending MD: Jerene Bears , MD Date of Birth: [birth year] CSN: [id number] Age: 61 Admit Type: Outpatient Procedure:                Upper GI endoscopy Indications:              Cirrhosis with suspected esophageal varices,                            Follow-up of esophageal varices; last EGD June 2015 Providers:                Lajuan Lines. Hilarie Fredrickson, MD, Kingsley Plan, RN, Cletis Athens, Technician Referring MD:              Medicines:                Monitored Anesthesia Care Complications:            No immediate complications. Estimated Blood Loss:     Estimated blood loss: none. Procedure:                Pre-Anesthesia Assessment:                           - Prior to the procedure, a History and Physical                            was performed, and patient medications and                            allergies were reviewed. The patient's tolerance of                            previous anesthesia was also reviewed. The risks                            and benefits of the procedure and the sedation                            options and risks were discussed with the patient.                            All questions were answered, and informed consent                            was obtained. Prior Anticoagulants: The patient has                            taken no previous anticoagulant or antiplatelet                            agents. ASA Grade Assessment: III - A patient with  severe systemic disease. After reviewing the risks                            and benefits, the patient was deemed in                            satisfactory condition to undergo the procedure.                           After obtaining informed consent, the endoscope was                            passed under direct vision. Throughout the   procedure, the patient's blood pressure, pulse, and                            oxygen saturations were monitored continuously. The                            Endoscope was introduced through the mouth, and                            advanced to the second part of duodenum. The upper                            GI endoscopy was accomplished without difficulty.                            The patient tolerated the procedure well. Scope In: Scope Out: Findings:      Four columns of non-bleeding large (> 5 mm) varices were found in the       middle third of the esophagus and in the lower third of the esophagus.       No stigmata of recent bleeding were evident and no red wale signs were       present. The varices appeared larger than they were at prior exam in       June 2015. Given current guidelines, lack of prior variceal hemorrhage,       and the use of NSBB currently, EVL was not performed.      Mild portal hypertensive gastropathy was found in the cardia, in the       gastric fundus and in the gastric body.      Suspect gastroparesis due to retained gastric contents seen in the       gastric body.      The cardia and gastric fundus were otherwise normal on retroflexion with       no evidence of gastric varices.      The examined duodenum was normal. Impression:               - Non-bleeding large (> 5 mm) esophageal varices                            without stigmata of bleeding.                           - Mild  portal hypertensive gastropathy.                           - Gastroparesis, likely secondary to diabetes                            mellitus type II.                           - Normal examined duodenum.                           - No specimens collected. Moderate Sedation:      N/A Recommendation:           - Patient has a contact number available for                            emergencies. The signs and symptoms of potential                            delayed complications  were discussed with the                            patient. Return to normal activities tomorrow.                            Written discharge instructions were provided to the                            patient.                           - Resume previous low sodium diet.                           - Continue present medications including nadolol at                            current dose.                           - No repeat plan for repeat upper endoscopy for                            variceal screening/surveillance given use of NSBB.                           - Return to GI clinic as previously recommended. Procedure Code(s):        --- Professional ---                           [phone], Esophagogastroduodenoscopy, flexible,                            transoral; diagnostic, including collection of  specimen(s) by brushing or washing, when performed                            (separate procedure) Diagnosis Code(s):        --- Professional ---                           K74.60, Unspecified cirrhosis of liver                           I85.10, Secondary esophageal varices without                            bleeding                           K76.6, Portal hypertension                           K31.89, Other diseases of stomach and duodenum                           E11.43, Type 2 diabetes mellitus with diabetic                            autonomic (poly)neuropathy                           K31.84, Gastroparesis CPT copyright 2016 American Medical Association. All rights reserved. The codes documented in this report are preliminary and upon coder review may  be revised to meet current compliance requirements. Jerene Bears, MD 07/12/2016 10:02:58 AM This report has been signed electronically. Number of Addenda: 0

## 2016-07-12 NOTE — Anesthesia Postprocedure Evaluation (Signed)
Anesthesia Post Note  Patient: Adam Butler  Procedure(s) Performed: Procedure(s) (LRB): ESOPHAGOGASTRODUODENOSCOPY (EGD) WITH PROPOFOL (N/A)  Patient location during evaluation: PACU Anesthesia Type: MAC Level of consciousness: awake and alert Pain management: pain level controlled Vital Signs Assessment: post-procedure vital signs reviewed and stable Respiratory status: spontaneous breathing, nonlabored ventilation, respiratory function stable and patient connected to nasal cannula oxygen Cardiovascular status: stable and blood pressure returned to baseline Anesthetic complications: no       Last Vitals:  Vitals:   07/12/16 0817 07/12/16 0831  BP: (!) 81/42 (!) 109/52  Pulse: 67   Resp: 14   Temp: 36.5 C     Last Pain:  Vitals:   07/12/16 0817  TempSrc: Leonard Schwartz

## 2016-07-12 NOTE — H&P (Signed)
HPI: Adam Butler is a 61 year old male with history of NASH cirrhosis with history of varices, hepatic encephalopathy, CHF, hypertension, hyperlipidemia and diabetes who seen in the office by Nicoletta Ba, PA-C on 05/30/2016.  He presents today for upper endoscopy for variceal screening. Last EGD June 2015 with 3 columns of small varices. He has been on nadolol however a recent MRI abdomen showed large gastric and distal esophageal varices. For this reason the decision was made to repeat EGD. He denies complaint today. Her recent bleeding, encephalopathy or jaundice.   Past Medical History:  Diagnosis Date  . Anginal pain ([place])    LAST 2 MO AGO  . Arthritis   . Bacteremia   . Cholelithiasis   . Cirrhosis ([place]) 2011   Cryptogenic, Likely NASH. Family/pt deny EtOH. HCV, HBV, HAV negative. ANA negative. AMA positive. Ascites 12/11  . Coronary artery disease    Inferior MI 12/11; LHC with occluded mid CFX and 80% proximal RCA. EF 55%. He had 3.0 x 28 vision BMS to CFX  . Diastolic CHF, acute ([place])    Echo 12/11 with ef 50-55% and mild LVH. EF 55% by LV0gram in 12/11  . Dysrhythmia   . Esophageal varices ([place]) 2011, 2013   no hx acute variceal bleed  . GI bleed    12/11: Etiology not clearly defined. EGD: nonbleeding esophageal varices.  . Hepatic encephalopathy ([place]) 2011, 12/2013  . Hx of echocardiogram    Echo 5/16:  EF 55-60%, no RWMA, mod LAE  . Hyperlipemia   . Hypertension   . Myocardial infarction ? 2012  . PFO (patent foramen ovale): Per TEE 04/20/2015 04/20/2015  . Portal hypertension ([place])   . Rectal varices   . Rectal varices   . S/P coronary artery stent placement 06/2010  . Shortness of breath dyspnea   . SVT (supraventricular tachycardia) ([place])    1/12: appeared to be an ectopic atrial tachycardia. Required DCCV with hemodynamic instability  . Thrombocytopenia ([place])   . Type II diabetes mellitus ([place]) 2011    Past Surgical History:  Procedure Laterality Date  .  APPENDECTOMY    . CARDIAC CATHETERIZATION  07/01/2010   BMS to CFX.  Marland Kitchen CATARACT EXTRACTION W/PHACO Left 04/28/2016   Procedure: CATARACT EXTRACTION PHACO AND INTRAOCULAR LENS PLACEMENT (IOC);  Surgeon: Leandrew Koyanagi, MD;  Location: [facility] ORS;  Service: Ophthalmology;  Laterality: Left;  Lot# [id number] H [place]: 05:31.7 AP%: 28.2 CDE: 93.48   . COLONOSCOPY  04/13/2012   Procedure: COLONOSCOPY;  Surgeon: Inda Castle, MD;  Location: [facility] ENDOSCOPY;  Service: Endoscopy;  Laterality: N/A;  . CORONARY ANGIOPLASTY    . CORONARY STENT PLACEMENT  06/30/2010   CFX   Distal        . ESOPHAGEAL BANDING N/A 01/01/2014   Procedure: ESOPHAGEAL BANDING;  Surgeon: Jerene Bears, MD;  Location: [facility] ENDOSCOPY;  Service: Endoscopy;  Laterality: N/A;  . ESOPHAGOGASTRODUODENOSCOPY  04/13/2012   Procedure: ESOPHAGOGASTRODUODENOSCOPY (EGD);  Surgeon: Inda Castle, MD;  Location: [facility];  Service: Endoscopy;  Laterality: N/A;  . ESOPHAGOGASTRODUODENOSCOPY N/A 01/01/2014   Procedure: ESOPHAGOGASTRODUODENOSCOPY (EGD);  Surgeon: Jerene Bears, MD;  Location: [facility]COPY;  Service: Endoscopy;  Laterality: N/A;  . LEFT HEART CATHETERIZATION WITH CORONARY ANGIOGRAM N/A 10/15/2014   Procedure: LEFT HEART CATHETERIZATION WITH CORONARY ANGIOGRAM;  Surgeon: Peter M Martinique, MD;  Location: [facility] CATH LAB;  Service: Cardiovascular;  Laterality: N/A;  . orif r leg    . TEE WITHOUT CARDIOVERSION N/A 04/20/2015   Procedure:  TRANSESOPHAGEAL ECHOCARDIOGRAM (TEE);  Surgeon: Fay Records, MD;  Location: [facility] ENDOSCOPY;  Service: Cardiovascular;  Laterality: N/A;     (Not in an outpatient encounter)  Allergies  Allergen Reactions  . Isosorbide Nitrate Other (See Comments)    Nose bleeds    Family History  Problem Relation Age of Onset  . Heart attack Brother 85    MI  . Diabetes Brother   . Heart attack Father   . Diabetes Father   . COPD Father   . COPD Mother   . Heart attack Brother   . Stroke Neg Hx     Social  History  Substance Use Topics  . Smoking status: Current Every Day Smoker    Packs/day: 0.25    Years: 36.00    Types: E-cigarettes, Cigarettes  . Smokeless tobacco: Never Used     Comment: uses vapor cigarettes (2016 ); I smoke about 4 cigarettes a day  . Alcohol use No    ROS: As per history of present illness, otherwise negative  BP (!) 109/52   Pulse 67   Temp 97.7 F (36.5 C) (Oral)   Resp 14   Ht 5' 7"  (1.702 m)   Wt 283 lb (128.4 kg)   SpO2 99%   BMI 44.32 kg/m  Gen: awake, alert, NAD HEENT: anicteric, op clear CV: RRR, sem Pulm: CTA b/l Abd: soft, obese, NT/ND, +BS throughout Ext: no c/c/e Neuro: nonfocal  RELEVANT LABS AND IMAGING: CBC    Component Value Date/Time   WBC 5.6 05/30/2016 1427   RBC 3.87 (L) 05/30/2016 1427   HGB 12.7 (L) 05/30/2016 1427   HCT 37.1 (L) 05/30/2016 1427   PLT 70.0 (L) 05/30/2016 1427   MCV 95.9 05/30/2016 1427   MCH 32.9 02/24/2016 0736   MCHC 34.2 05/30/2016 1427   RDW 16.5 (H) 05/30/2016 1427   LYMPHSABS 1.5 05/30/2016 1427   MONOABS 0.6 05/30/2016 1427   EOSABS 0.3 05/30/2016 1427   BASOSABS 0.0 05/30/2016 1427    CMP     Component Value Date/Time   NA 135 06/15/2016 1147   K 4.0 06/15/2016 1147   CL 99 06/15/2016 1147   CO2 30 06/15/2016 1147   GLUCOSE 379 (H) 06/15/2016 1147   BUN 8 06/15/2016 1147   CREATININE 0.83 06/15/2016 1147   CREATININE 0.74 03/15/2011 0827   CALCIUM 9.0 06/15/2016 1147   PROT 6.7 05/30/2016 1427   ALBUMIN 2.8 (L) 05/30/2016 1427   AST 55 (H) 05/30/2016 1427   ALT 34 05/30/2016 1427   ALKPHOS 177 (H) 05/30/2016 1427   BILITOT 1.9 (H) 05/30/2016 1427   GFRNONAA >60 02/24/2016 0736   GFRAA >60 02/24/2016 0736   Lab Results  Component Value Date   INR 1.3 (H) 05/30/2016   INR 1.43 12/17/2015   INR 1.3 (H) 12/01/2015    ASSESSMENT/PLAN: 61 year old male with history of NASH cirrhosis with history of varices, hepatic encephalopathy, CHF, hypertension, hyperlipidemia and  diabetes who presents for outpatient EGD for variceal screening/surveillance  1. EGD -- The nature of the procedure, as well as the risks, benefits, and alternatives were carefully and thoroughly reviewed with the patient. Ample time for discussion and questions allowed. The patient understood, was satisfied, and agreed to proceed.  --Will continue nadolol and plan banding if large varices deemed high risk for hemorrhage

## 2016-07-12 NOTE — Transfer of Care (Signed)
Immediate Anesthesia Transfer of Care Note  Patient: Adam Butler  Procedure(s) Performed: Procedure(s): ESOPHAGOGASTRODUODENOSCOPY (EGD) WITH PROPOFOL (N/A)  Patient Location: PACU  Anesthesia Type:MAC  Level of Consciousness:  sedated, patient cooperative and responds to stimulation  Airway & Oxygen Therapy:Patient Spontanous Breathing and Patient connected to face mask oxgen  Post-op Assessment:  Report given to PACU RN and Post -op Vital signs reviewed and stable  Post vital signs:  Reviewed and stable  Last Vitals:  Vitals:   07/12/16 0817 07/12/16 0831  BP: (!) 81/42 (!) 109/52  Pulse: 67   Resp: 14   Temp: 02.3 C     Complications: No apparent anesthesia complications

## 2016-07-12 NOTE — Anesthesia Preprocedure Evaluation (Addendum)
Anesthesia Evaluation  Patient identified by MRN, date of birth, ID band Patient awake    Reviewed: Allergy & Precautions, NPO status , Patient's Chart, lab work & pertinent test results  Airway Mallampati: III  TM Distance: >3 FB Neck ROM: Full    Dental  (+) Chipped   Pulmonary shortness of breath, Current Smoker,    Pulmonary exam normal        Cardiovascular hypertension, Pt. on medications and Pt. on home beta blockers + CAD, + Past MI, + Cardiac Stents and +CHF  Normal cardiovascular exam+ dysrhythmias      Neuro/Psych negative neurological ROS     GI/Hepatic GERD  Controlled,(+) Cirrhosis       ,   Endo/Other  diabetes, Type 2, Insulin DependentMorbid obesity  Renal/GU negative Renal ROS     Musculoskeletal  (+) Arthritis ,   Abdominal (+) + obese,   Peds  Hematology   Anesthesia Other Findings PFO. Hx of SVT.  Reproductive/Obstetrics                            Anesthesia Physical  Anesthesia Plan  ASA: III  Anesthesia Plan: MAC   Post-op Pain Management:    Induction: Intravenous  Airway Management Planned:   Additional Equipment:   Intra-op Plan:   Post-operative Plan:   Informed Consent: I have reviewed the patients History and Physical, chart, labs and discussed the procedure including the risks, benefits and alternatives for the proposed anesthesia with the patient or authorized representative who has indicated his/her understanding and acceptance.     Plan Discussed with:   Anesthesia Plan Comments:         Anesthesia Quick Evaluation

## 2016-07-13 ENCOUNTER — Encounter (HOSPITAL_COMMUNITY): Payer: Self-pay | Admitting: Internal Medicine

## 2016-07-19 ENCOUNTER — Encounter: Payer: Self-pay | Admitting: Internal Medicine

## 2016-07-19 ENCOUNTER — Ambulatory Visit (INDEPENDENT_AMBULATORY_CARE_PROVIDER_SITE_OTHER): Payer: PPO | Admitting: Internal Medicine

## 2016-07-19 VITALS — BP 108/66 | HR 63 | Temp 97.8°F | Wt 278.0 lb

## 2016-07-19 DIAGNOSIS — I2510 Atherosclerotic heart disease of native coronary artery without angina pectoris: Secondary | ICD-10-CM | POA: Diagnosis not present

## 2016-07-19 DIAGNOSIS — K7581 Nonalcoholic steatohepatitis (NASH): Secondary | ICD-10-CM | POA: Diagnosis not present

## 2016-07-19 DIAGNOSIS — K219 Gastro-esophageal reflux disease without esophagitis: Secondary | ICD-10-CM

## 2016-07-19 DIAGNOSIS — K7682 Hepatic encephalopathy: Secondary | ICD-10-CM

## 2016-07-19 DIAGNOSIS — M170 Bilateral primary osteoarthritis of knee: Secondary | ICD-10-CM

## 2016-07-19 DIAGNOSIS — M179 Osteoarthritis of knee, unspecified: Secondary | ICD-10-CM | POA: Insufficient documentation

## 2016-07-19 DIAGNOSIS — Z23 Encounter for immunization: Secondary | ICD-10-CM

## 2016-07-19 DIAGNOSIS — E7800 Pure hypercholesterolemia, unspecified: Secondary | ICD-10-CM

## 2016-07-19 DIAGNOSIS — I5032 Chronic diastolic (congestive) heart failure: Secondary | ICD-10-CM

## 2016-07-19 DIAGNOSIS — M1710 Unilateral primary osteoarthritis, unspecified knee: Secondary | ICD-10-CM | POA: Insufficient documentation

## 2016-07-19 DIAGNOSIS — K7290 Hepatic failure, unspecified without coma: Secondary | ICD-10-CM

## 2016-07-19 DIAGNOSIS — E119 Type 2 diabetes mellitus without complications: Secondary | ICD-10-CM | POA: Diagnosis not present

## 2016-07-19 DIAGNOSIS — K7460 Unspecified cirrhosis of liver: Secondary | ICD-10-CM

## 2016-07-19 DIAGNOSIS — Z794 Long term (current) use of insulin: Secondary | ICD-10-CM | POA: Diagnosis not present

## 2016-07-19 LAB — COMPREHENSIVE METABOLIC PANEL
ALT: 46 U/L (ref 0–53)
AST: 62 U/L — ABNORMAL HIGH (ref 0–37)
Albumin: 3.1 g/dL — ABNORMAL LOW (ref 3.5–5.2)
Alkaline Phosphatase: 215 U/L — ABNORMAL HIGH (ref 39–117)
BUN: 11 mg/dL (ref 6–23)
CHLORIDE: 101 meq/L (ref 96–112)
CO2: 26 meq/L (ref 19–32)
Calcium: 9.7 mg/dL (ref 8.4–10.5)
Creatinine, Ser: 0.76 mg/dL (ref 0.40–1.50)
GFR: 111.06 mL/min (ref >60.00)
Glucose, Bld: 236 mg/dL — ABNORMAL HIGH (ref 70–99)
POTASSIUM: 3.7 meq/L (ref 3.5–5.1)
SODIUM: 135 meq/L (ref 135–145)
Total Bilirubin: 1.8 mg/dL — ABNORMAL HIGH (ref 0.2–1.2)
Total Protein: 7.5 g/dL (ref 6.0–8.3)

## 2016-07-19 LAB — MICROALBUMIN / CREATININE URINE RATIO
Creatinine,U: 51.1 mg/dL
Microalb Creat Ratio: 1.4 mg/g (ref 0.0–30.0)
Microalb, Ur: <0.7 mg/dL (ref 0.0–1.9)

## 2016-07-19 LAB — LIPID PANEL
CHOLESTEROL: 139 mg/dL (ref 0–200)
HDL: 47.9 mg/dL (ref >39.00)
LDL CALC: 70 mg/dL (ref 0–99)
NonHDL: 90.83
Total CHOL/HDL Ratio: 3
Triglycerides: 104 mg/dL (ref 0.0–149.0)
VLDL: 20.8 mg/dL (ref 0.0–40.0)

## 2016-07-19 LAB — HEMOGLOBIN A1C: HEMOGLOBIN A1C: 10.2 % — AB (ref 4.6–6.5)

## 2016-07-19 MED ORDER — INSULIN REGULAR HUMAN 100 UNIT/ML IJ SOLN: INTRAMUSCULAR | 1 refills | Status: DC

## 2016-07-19 MED ORDER — METFORMIN HCL 1000 MG PO TABS: 1000.0000 mg | ORAL_TABLET | Freq: Two times a day (BID) | ORAL | 2 refills | Status: DC

## 2016-07-19 MED ORDER — NITROGLYCERIN 0.4 MG SL SUBL: 0.4000 mg | SUBLINGUAL_TABLET | SUBLINGUAL | 5 refills | Status: AC | PRN

## 2016-07-19 NOTE — Assessment & Plan Note (Signed)
He has been stable over the last few months He will continue Lactulose He will continue to follow with Dr. Hilarie Fredrickson CMET today

## 2016-07-19 NOTE — Assessment & Plan Note (Signed)
Controled with [facility]

## 2016-07-19 NOTE — Assessment & Plan Note (Signed)
CMET and Lipid profile today No angina but likes to have Nitrostat on hand, refilled today Continue Lipitor, Ranexa and Nadalol Advised him to make a follow up appt with Dr. Aundra Dubin

## 2016-07-19 NOTE — Assessment & Plan Note (Signed)
CMET and Lipid profile today Encouraged him to consume a low fat diet  Continue Lipitor for now.

## 2016-07-19 NOTE — Assessment & Plan Note (Signed)
Discussed how weight loss could help improve his knee pain.  Advised him it is okau to take Aleve prn

## 2016-07-19 NOTE — Assessment & Plan Note (Signed)
Encouraged him to work on diet and exercise

## 2016-07-19 NOTE — Assessment & Plan Note (Signed)
Will check A1C and microalbumin today Encouraged him to consume a low fat, low carb diet and exercise to lose weight Continue Regular Insulin and Metformin If A1C > 7%, will add Glipizide Fly and pneumovax UTD Eye exam UTD Foot exam today

## 2016-07-19 NOTE — Assessment & Plan Note (Signed)
Compensated CMET today Continue Nadolol, Lasix and Spironolactone for now Advised him to make a follow up appt with Dr. Aundra Dubin

## 2016-07-19 NOTE — Progress Notes (Signed)
Subjective:    Patient ID: Adam Butler, male    DOB: 17-Mar-1956, 61 y.o.   MRN: [id number]  HPI  Pt presents to the clinic today to follow up chronic conditions:  HLD: His last LDL was 74, 09/2015. He is taking Lipitor as prescribed. He denies myalgias. He does try to consume a low fat diet.  CAD s/p MI: He does have stents. He takes Nadalol, Lipitor and Ranexa as prescribed. He is taking ASA daily. He follows with Dr. Aundra Dubin, note from 06/2015 reviewed.  DM 2: His last A1C was 6.7%, 09/2015. His sugars range 120-425. He is taking Regular Insulin on a sliding scale, averaging 80 units daily. He also takes Metformin as prescribed. He does have loose stools but reports this is from the Lactulose. Flu shot 04/2015. Pneumovax 02/2015. His last eye exam was 04/2016.  Arthritis: Mainly in his knees. He does not take anything OTC for this.  GERD: He is not sure what triggers his reflux. He takes Zantac daily and denies breakthrough symptoms.  NASH Cirrhosis with Hepatic Encephalopathy and Esophageal Varices: He follows with Dr. Hilarie Fredrickson. He recently had a upper GI to decides if his varices needed banding. He is currently only on Lactulose.  Chronic Diastolic CHF: He is taking Lasix and Spironolactone as prescribed. He does not weigh himself daily. He has noticed some edema in his legs but no shortness of breath. Echo from 02/2016 reviewed, EF > 55%  Obesity: He is up 25 lbs since his last follow up visit. His weight today is 278 lbs with a BMI of 43.54. He does note adhere to any diet or exercise regimen.  Review of Systems      Past Medical History:  Diagnosis Date  . Anginal pain ([place])    LAST 2 MO AGO  . Arthritis   . Bacteremia   . Cholelithiasis   . Cirrhosis ([place]) 2011   Cryptogenic, Likely NASH. Family/pt deny EtOH. HCV, HBV, HAV negative. ANA negative. AMA positive. Ascites 12/11  . Coronary artery disease    Inferior MI 12/11; LHC with occluded mid CFX and 80% proximal RCA.  EF 55%. He had 3.0 x 28 vision BMS to CFX  . Diastolic CHF, acute ([place])    Echo 12/11 with ef 50-55% and mild LVH. EF 55% by LV0gram in 12/11  . Dysrhythmia   . Esophageal varices ([place]) 2011, 2013   no hx acute variceal bleed  . GI bleed    12/11: Etiology not clearly defined. EGD: nonbleeding esophageal varices.  . Hepatic encephalopathy ([place]) 2011, 12/2013  . Hx of echocardiogram    Echo 5/16:  EF 55-60%, no RWMA, mod LAE  . Hyperlipemia   . Hypertension   . Myocardial infarction ? 2012  . PFO (patent foramen ovale): Per TEE 04/20/2015 04/20/2015  . Portal hypertension ([place])   . Rectal varices   . Rectal varices   . S/P coronary artery stent placement 06/2010  . Shortness of breath dyspnea   . SVT (supraventricular tachycardia) ([place])    1/12: appeared to be an ectopic atrial tachycardia. Required DCCV with hemodynamic instability  . Thrombocytopenia ([place])   . Type II diabetes mellitus ([place]) 2011    Current Outpatient Prescriptions  Medication Sig Dispense Refill  . aspirin 81 MG tablet Take 81 mg by mouth daily.     Marland Kitchen atorvastatin (LIPITOR) 10 MG tablet TAKE 1 TABLET (10 MG TOTAL) BY MOUTH DAILY. 90 tablet 1  . furosemide (LASIX) 40 MG  tablet Take 1 tablet (40 mg total) by mouth every morning. 90 tablet 3  . insulin regular (NOVOLIN R,HUMULIN R) 250 units/2.35m (100 units/mL) injection According to sliding scale: 151-200: 2 units, 201-250 4 units, 251-300 6 units, 301-350 8 units. If > 400 call me. 10 mL 1  . lactulose (CHRONULAC) 10 GM/15ML solution TAKE 45 ML 3 TIMES A DAY ON SCHEDULE. IF NO BM BY 1PM TAKE ADDT'L 45 ML DOSE 4730 mL 6  . metFORMIN (GLUCOPHAGE) 1000 MG tablet Take 1,000 mg by mouth 2 (two) times daily.  11  . Multiple Vitamin (MULTIVITAMIN) capsule Take 1 capsule by mouth daily.      . nadolol (CORGARD) 40 MG tablet Take 1 tablet (40 mg total) by mouth daily. 30 tablet 6  . nitroGLYCERIN (NITROSTAT) 0.4 MG SL tablet Place 1 tablet (0.4 mg total) under the tongue  every 5 (five) minutes as needed for chest pain (chest pain). 25 tablet 5  . ranitidine (ZANTAC) 150 MG tablet Take 150 mg by mouth daily.    .Marland Kitchenspironolactone (ALDACTONE) 25 MG tablet Take 0.5 tablets (12.5 mg total) by mouth daily. (Patient taking differently: Take 25 mg by mouth daily. ) 30 tablet 6   No current facility-administered medications for this visit.     Allergies  Allergen Reactions  . Isosorbide Nitrate Other (See Comments)    Nose bleeds    Family History  Problem Relation Age of Onset  . Heart attack Brother 568   MI  . Diabetes Brother   . Heart attack Father   . Diabetes Father   . COPD Father   . COPD Mother   . Heart attack Brother   . Stroke Neg Hx     Social History   Social History  . Marital status: Married    Spouse name: N/A  . Number of children: 3  . Years of education: N/A   Occupational History  . Unemployed Other    Worked in maintenance prior   Social History Main Topics  . Smoking status: Current Every Day Smoker    Packs/day: 0.25    Years: 36.00    Types: E-cigarettes, Cigarettes  . Smokeless tobacco: Never Used     Comment: uses vapor cigarettes (2016 ); I smoke about 4 cigarettes a day  . Alcohol use No  . Drug use: No  . Sexual activity: No   Other Topics Concern  . Not on file   Social History Narrative   Married   Gets regular exercise: walking     Constitutional: Pt reports weight gain. Denies fever, malaise, fatigue, headache.  HEENT: Denies eye pain, eye redness, ear pain, ringing in the ears, wax buildup, runny nose, nasal congestion, bloody nose, or sore throat. Respiratory: Denies difficulty breathing, shortness of breath, cough or sputum production.   Cardiovascular: Pt reports intermittent swelling in his legs. Denies chest pain, chest tightness, palpitations or swelling in the hands.  Gastrointestinal: Pt reports intermittent diarrhea. Denies abdominal pain, bloating, constipation, or blood in the stool.    Musculoskeletal: Pt reports intermittent bilateral knee pain. Denies decrease in range of motion, difficulty with gait, muscle pain or joint swelling.  Skin: Denies redness, rashes, lesions or ulcercations.  Neurological: Denies dizziness, difficulty with memory, difficulty with speech or problems with balance and coordination.  Psych: Denies anxiety, depression, SI/HI.  No other specific complaints in a complete review of systems (except as listed in HPI above).  Objective:   Physical Exam  BP 108/66   Pulse 63   Temp 97.8 F (36.6 C) (Oral)   Wt 278 lb (126.1 kg)   SpO2 98%   BMI 43.54 kg/m  Wt Readings from Last 3 Encounters:  07/19/16 278 lb (126.1 kg)  07/12/16 283 lb (128.4 kg)  05/30/16 283 lb (128.4 kg)    General: Appears his stated age, obese in NAD. Skin: Warm, dry and intact. No  ulcerations noted. HEENT: Throat/Mouth: Teeth present, mucosa pink and moist, no exudate, lesions or ulcerations noted.  Cardiovascular: Normal rate and rhythm. S1,S2 noted.  No murmur, rubs or gallops noted. No JVD or BLE edema. No carotid bruits noted. Pulmonary/Chest: Normal effort and positive vesicular breath sounds. No respiratory distress. No wheezes, rales or ronchi noted.  Abdomen: Soft and nontender. Normal bowel sounds. No distention or masses noted. Musculoskeletal: No difficulty with gait.  Neurological: Alert and oriented.   Psychiatric: Mood and affect normal.  BMET    Component Value Date/Time   NA 135 06/15/2016 1147   K 4.0 06/15/2016 1147   CL 99 06/15/2016 1147   CO2 30 06/15/2016 1147   GLUCOSE 379 (H) 06/15/2016 1147   BUN 8 06/15/2016 1147   CREATININE 0.83 06/15/2016 1147   CREATININE 0.74 03/15/2011 0827   CALCIUM 9.0 06/15/2016 1147   GFRNONAA >60 02/24/2016 0736   GFRAA >60 02/24/2016 0736    Lipid Panel     Component Value Date/Time   CHOL 133 09/29/2015 1317   TRIG 106.0 09/29/2015 1317   HDL 37.90 (L) 09/29/2015 1317   CHOLHDL 4 09/29/2015  1317   VLDL 21.2 09/29/2015 1317   LDLCALC 74 09/29/2015 1317    CBC    Component Value Date/Time   WBC 5.6 05/30/2016 1427   RBC 3.87 (L) 05/30/2016 1427   HGB 12.7 (L) 05/30/2016 1427   HCT 37.1 (L) 05/30/2016 1427   PLT 70.0 (L) 05/30/2016 1427   MCV 95.9 05/30/2016 1427   MCH 32.9 02/24/2016 0736   MCHC 34.2 05/30/2016 1427   RDW 16.5 (H) 05/30/2016 1427   LYMPHSABS 1.5 05/30/2016 1427   MONOABS 0.6 05/30/2016 1427   EOSABS 0.3 05/30/2016 1427   BASOSABS 0.0 05/30/2016 1427    Hgb A1C Lab Results  Component Value Date   HGBA1C 6.7 (H) 09/29/2015           Assessment & Plan:

## 2016-07-19 NOTE — Assessment & Plan Note (Signed)
Controlled on Zantac He wants to stop this but I advised him to continue for now Will monitor

## 2016-07-19 NOTE — Patient Instructions (Signed)
Carbohydrate Counting for Diabetes Mellitus, Adult Carbohydrate counting is a method for keeping track of how many carbohydrates you eat. Eating carbohydrates naturally increases the amount of sugar (glucose) in the blood. Counting how many carbohydrates you eat helps keep your blood glucose within normal limits, which helps you manage your diabetes (diabetes mellitus). It is important to know how many carbohydrates you can safely have in each meal. This is different for every person. A diet and nutrition specialist (registered dietitian) can help you make a meal plan and calculate how many carbohydrates you should have at each meal and snack. Carbohydrates are found in the following foods:  Grains, such as breads and cereals.  Dried beans and soy products.  Starchy vegetables, such as potatoes, peas, and corn.  Fruit and fruit juices.  Milk and yogurt.  Sweets and snack foods, such as cake, cookies, candy, chips, and soft drinks. How do I count carbohydrates? There are two ways to count carbohydrates in food. You can use either of the methods or a combination of both. Reading "Nutrition Facts" on packaged food  The "Nutrition Facts" list is included on the labels of almost all packaged foods and beverages in the [place] It includes:  The serving size.  Information about nutrients in each serving, including the grams (g) of carbohydrate per serving. To use the "Nutrition Facts":  Decide how many servings you will have.  Multiply the number of servings by the number of carbohydrates per serving.  The resulting number is the total amount of carbohydrates that you will be having. Learning standard serving sizes of other foods  When you eat foods containing carbohydrates that are not packaged or do not include "Nutrition Facts" on the label, you need to measure the servings in order to count the amount of carbohydrates:  Measure the foods that you will eat with a food scale or measuring  cup, if needed.  Decide how many standard-size servings you will eat.  Multiply the number of servings by 15. Most carbohydrate-rich foods have about 15 g of carbohydrates per serving.  For example, if you eat 8 oz (170 g) of strawberries, you will have eaten 2 servings and 30 g of carbohydrates (2 servings x 15 g = 30 g).  For foods that have more than one food mixed, such as soups and casseroles, you must count the carbohydrates in each food that is included. The following list contains standard serving sizes of common carbohydrate-rich foods. Each of these servings has about 15 g of carbohydrates:   hamburger bun or  English muffin.   oz (15 mL) syrup.   oz (14 g) jelly.  1 slice of bread.  1 six-inch tortilla.  3 oz (85 g) cooked rice or pasta.  4 oz (113 g) cooked dried beans.  4 oz (113 g) starchy vegetable, such as peas, corn, or potatoes.  4 oz (113 g) hot cereal.  4 oz (113 g) mashed potatoes or  of a large baked potato.  4 oz (113 g) canned or frozen fruit.  4 oz (120 mL) fruit juice.  4-6 crackers.  6 chicken nuggets.  6 oz (170 g) unsweetened dry cereal.  6 oz (170 g) plain fat-free yogurt or yogurt sweetened with artificial sweeteners.  8 oz (240 mL) milk.  8 oz (170 g) fresh fruit or one small piece of fruit.  24 oz (680 g) popped popcorn. Example of carbohydrate counting Sample meal  3 oz (85 g) chicken breast.  6 oz (  170 g) brown rice.  4 oz (113 g) corn.  8 oz (240 mL) milk.  8 oz (170 g) strawberries with sugar-free whipped topping. Carbohydrate calculation 1. Identify the foods that contain carbohydrates:  Rice.  Corn.  Milk.  Strawberries. 2. Calculate how many servings you have of each food:  2 servings rice.  1 serving corn.  1 serving milk.  1 serving strawberries. 3. Multiply each number of servings by 15 g:  2 servings rice x 15 g = 30 g.  1 serving corn x 15 g = 15 g.  1 serving milk x 15 g = 15  g.  1 serving strawberries x 15 g = 15 g. 4. Add together all of the amounts to find the total grams of carbohydrates eaten:  30 g + 15 g + 15 g + 15 g = 75 g of carbohydrates total. This information is not intended to replace advice given to you by your health care provider. Make sure you discuss any questions you have with your health care provider. Document Released: 06/27/2005 Document Revised: 01/15/2016 Document Reviewed: 12/09/2015 Elsevier Interactive Patient Education  2017 Elsevier Inc.  

## 2016-07-21 ENCOUNTER — Telehealth: Payer: Self-pay

## 2016-07-21 NOTE — Telephone Encounter (Signed)
Pt was reviewing med list and pt takes Vit B 12 1000 mcg po daily. medlist updated.

## 2016-07-22 MED ORDER — INSULIN GLARGINE 100 UNIT/ML SOLOSTAR PEN: 24.0000 [IU] | PEN_INJECTOR | Freq: Every day | SUBCUTANEOUS | 2 refills | Status: DC

## 2016-07-22 MED ORDER — GLIPIZIDE 10 MG PO TABS: 10.0000 mg | ORAL_TABLET | Freq: Two times a day (BID) | ORAL | 4 refills | Status: DC

## 2016-07-22 NOTE — Addendum Note (Signed)
Addended by: Lurlean Nanny on: 07/22/2016 04:39 PM   Modules accepted: Orders

## 2016-07-25 NOTE — Addendum Note (Signed)
Addended by: Lurlean Nanny on: 07/25/2016 05:44 PM   Modules accepted: Orders

## 2016-07-27 ENCOUNTER — Ambulatory Visit: Payer: PPO | Admitting: Interventional Cardiology

## 2016-08-02 ENCOUNTER — Telehealth: Payer: Self-pay

## 2016-08-02 NOTE — Telephone Encounter (Signed)
Pt left v/m; pt seen 07/19/16; insulin is too expensive and pt wants to know what could be substituted that is less expensive. Pt request cb.

## 2016-08-02 NOTE — Telephone Encounter (Signed)
Will his insurance cover Levemir or Basiglar?

## 2016-08-03 MED ORDER — INSULIN DETEMIR 100 UNIT/ML FLEXPEN: 24.0000 [IU] | PEN_INJECTOR | Freq: Every day | SUBCUTANEOUS | 3 refills | Status: DC

## 2016-08-03 NOTE — Addendum Note (Signed)
Addended by: [name] on: 08/03/2016 11:18 AM   Modules accepted: Orders

## 2016-08-03 NOTE — Telephone Encounter (Signed)
Ir sent to pharmacy, Lantus D/C'd

## 2016-08-04 ENCOUNTER — Ambulatory Visit (INDEPENDENT_AMBULATORY_CARE_PROVIDER_SITE_OTHER): Payer: PPO | Admitting: Interventional Cardiology

## 2016-08-04 ENCOUNTER — Encounter: Payer: Self-pay | Admitting: Interventional Cardiology

## 2016-08-04 VITALS — BP 112/64 | HR 73 | Ht 67.0 in | Wt 286.2 lb

## 2016-08-04 DIAGNOSIS — Z720 Tobacco use: Secondary | ICD-10-CM | POA: Diagnosis not present

## 2016-08-04 DIAGNOSIS — I2510 Atherosclerotic heart disease of native coronary artery without angina pectoris: Secondary | ICD-10-CM | POA: Diagnosis not present

## 2016-08-04 DIAGNOSIS — E7800 Pure hypercholesterolemia, unspecified: Secondary | ICD-10-CM

## 2016-08-04 DIAGNOSIS — E1159 Type 2 diabetes mellitus with other circulatory complications: Secondary | ICD-10-CM | POA: Diagnosis not present

## 2016-08-04 NOTE — Progress Notes (Signed)
Cardiology Office Note   Date:  08/04/2016   ID:  Adam Butler, DOB 01/03/1956, MRN [id number]  PCP:  [name], NP    Chief Complaint  Patient presents with  . Follow-up  . Shortness of Breath    some     Wt Readings from Last 3 Encounters:  08/04/16 286 lb 3.2 oz (129.8 kg)  07/19/16 278 lb (126.1 kg)  07/12/16 283 lb (128.4 kg)       History of Present Illness: Adam Butler is a 61 y.o. male  With DM, NASH/cirrhosis, espohageal varices s/p banding Who has a h/o CAD and inferior MI in 12/11.  BMS was placed in the circumflex at that time.  Repeat cath in 4/16 showed CTO of RCA.    Managing liver disease with lactulose.  Follows with Dr. Hilarie Fredrickson.    He has had some chest discomfort when he has some emotional stress.   He started smoking again.  He thinks he smokes about 5 cigs/day.    Rare NTG use, with emotional stress.  He walks some for exercise.  No regular program for exercise.  He tries to pay attention to his step count.    He weighs himself daily and it has been stable, 274-277 naked.    He takes his medicines regularly.  Diuretic effect is good.   He has not tolerated isosorbide in the past.  Ranexa was too expensive of late.    No recent bleeding issues from his liver problems.        Past Medical History:  Diagnosis Date  . Arthritis   . Cholelithiasis   . Cirrhosis ([place]) 2011   Cryptogenic, Likely NASH. Family/pt deny EtOH. HCV, HBV, HAV negative. ANA negative. AMA positive. Ascites 12/11  . Coronary artery disease    Inferior MI 12/11; LHC with occluded mid CFX and 80% proximal RCA. EF 55%. He had 3.0 x 28 vision BMS to CFX  . Diastolic CHF, acute ([place])    Echo 12/11 with ef 50-55% and mild LVH. EF 55% by LV0gram in 12/11  . Esophageal varices ([place]) 2011, 2013   no hx acute variceal bleed  . Hepatic encephalopathy ([place]) 2011, 12/2013  . Hyperlipemia   . Myocardial infarction ? 2012  . PFO (patent foramen ovale): Per TEE  04/20/2015 04/20/2015  . Portal hypertension ([place])   . Rectal varices   . S/P coronary artery stent placement 06/2010  . SVT (supraventricular tachycardia) ([place])    1/12: appeared to be an ectopic atrial tachycardia. Required DCCV with hemodynamic instability  . Type II diabetes mellitus ([place]) 2011    Past Surgical History:  Procedure Laterality Date  . APPENDECTOMY    . CARDIAC CATHETERIZATION  07/01/2010   BMS to CFX.  Marland Kitchen CATARACT EXTRACTION W/PHACO Left 04/28/2016   Procedure: CATARACT EXTRACTION PHACO AND INTRAOCULAR LENS PLACEMENT (IOC);  Surgeon: Leandrew Koyanagi, MD;  Location: [facility] ORS;  Service: Ophthalmology;  Laterality: Left;  Lot# [id number] H [place]: 05:31.7 AP%: 28.2 CDE: 93.48   . COLONOSCOPY  04/13/2012   Procedure: COLONOSCOPY;  Surgeon: Inda Castle, MD;  Location: [facility] ENDOSCOPY;  Service: Endoscopy;  Laterality: N/A;  . CORONARY ANGIOPLASTY    . CORONARY STENT PLACEMENT  06/30/2010   CFX   Distal        . ESOPHAGEAL BANDING N/A 01/01/2014   Procedure: ESOPHAGEAL BANDING;  Surgeon: Jerene Bears, MD;  Location: [facility] ENDOSCOPY;  Service: Endoscopy;  Laterality: N/A;  . ESOPHAGOGASTRODUODENOSCOPY  04/13/2012   Procedure: ESOPHAGOGASTRODUODENOSCOPY (EGD);  Surgeon: Inda Castle, MD;  Location: [facility];  Service: Endoscopy;  Laterality: N/A;  . ESOPHAGOGASTRODUODENOSCOPY N/A 01/01/2014   Procedure: ESOPHAGOGASTRODUODENOSCOPY (EGD);  Surgeon: Jerene Bears, MD;  Location: [facility] ENDOSCOPY;  Service: Endoscopy;  Laterality: N/A;  . ESOPHAGOGASTRODUODENOSCOPY (EGD) WITH PROPOFOL N/A 07/12/2016   Procedure: ESOPHAGOGASTRODUODENOSCOPY (EGD) WITH PROPOFOL;  Surgeon: Jerene Bears, MD;  Location: [facility] ENDOSCOPY;  Service: Gastroenterology;  Laterality: N/A;  . LEFT HEART CATHETERIZATION WITH CORONARY ANGIOGRAM N/A 10/15/2014   Procedure: LEFT HEART CATHETERIZATION WITH CORONARY ANGIOGRAM;  Surgeon: Peter M Martinique, MD;  Location: [facility] CATH LAB;  Service: Cardiovascular;  Laterality: N/A;  .  orif r leg    . TEE WITHOUT CARDIOVERSION N/A 04/20/2015   Procedure: TRANSESOPHAGEAL ECHOCARDIOGRAM (TEE);  Surgeon: Fay Records, MD;  Location: [facility] ENDOSCOPY;  Service: Cardiovascular;  Laterality: N/A;     Current Outpatient Prescriptions  Medication Sig Dispense Refill  . aspirin 81 MG tablet Take 81 mg by mouth daily.     Marland Kitchen atorvastatin (LIPITOR) 10 MG tablet TAKE 1 TABLET (10 MG TOTAL) BY MOUTH DAILY. 90 tablet 1  . cyanocobalamin 1000 MCG tablet Take 1,000 mcg by mouth daily.    . furosemide (LASIX) 40 MG tablet Take 1 tablet (40 mg total) by mouth every morning. 90 tablet 3  . glipiZIDE (GLUCOTROL) 10 MG tablet Take 1 tablet (10 mg total) by mouth 2 (two) times daily before a meal. 60 tablet 4  . Insulin Detemir (LEVEMIR) 100 UNIT/ML Pen Inject 24 Units into the skin daily at 10 pm. 15 mL 3  . lactulose (CHRONULAC) 10 GM/15ML solution TAKE 45 ML 3 TIMES A DAY ON SCHEDULE. IF NO BM BY 1PM TAKE ADDT'L 45 ML DOSE 4730 mL 6  . metFORMIN (GLUCOPHAGE) 1000 MG tablet Take 1 tablet (1,000 mg total) by mouth 2 (two) times daily. 60 tablet 2  . Multiple Vitamin (MULTIVITAMIN) capsule Take 1 capsule by mouth daily.      . nadolol (CORGARD) 40 MG tablet Take 1 tablet (40 mg total) by mouth daily. 30 tablet 6  . nitroGLYCERIN (NITROSTAT) 0.4 MG SL tablet Place 1 tablet (0.4 mg total) under the tongue every 5 (five) minutes as needed for chest pain (chest pain). 25 tablet 5  . ranitidine (ZANTAC) 150 MG tablet Take 150 mg by mouth daily.    Marland Kitchen spironolactone (ALDACTONE) 25 MG tablet Take 0.5 tablets (12.5 mg total) by mouth daily. (Patient taking differently: Take 25 mg by mouth daily. ) 30 tablet 6   No current facility-administered medications for this visit.     Allergies:   Isosorbide nitrate    Social History:  The patient  reports that he has been smoking E-cigarettes and Cigarettes.  He has a 9.00 pack-year smoking history. He has never used smokeless tobacco. He reports that he does not  drink alcohol or use drugs.   Family History:  The patient's family history includes COPD in his father and mother; Diabetes in his brother and father; Heart attack in his brother and father; Heart attack (age of onset: 10) in his brother.    ROS:  Please see the history of present illness.   Otherwise, review of systems are positive for leg swelling intermittently.   All other systems are reviewed and negative.    PHYSICAL EXAM: VS:  BP 112/64   Pulse 73   Ht 5' 7"  (1.702 m)   Wt 286 lb 3.2 oz (129.8  kg)   SpO2 98%   BMI 44.83 kg/m  , BMI Body mass index is 44.83 kg/m. GEN: Well nourished, well developed, in no acute distress  HEENT: normal  Neck: no JVD, carotid bruits, or masses Cardiac: RRR; no murmurs, rubs, or gallops,; tr pretibial edema  Respiratory:  clear to auscultation bilaterally, normal work of breathing GI: soft, nontender, nondistended, + BS, obese MS: no deformity or atrophy  Skin: warm and dry, no rash Neuro:  Strength and sensation are intact Psych: euthymic mood, full affect   EKG:   The ekg ordered in 6/17 demonstrates NSR, inferior MI   Recent Labs: 05/30/2016: Hemoglobin 12.7; Platelets 70.0 07/19/2016: ALT 46; BUN 11; Creatinine, Ser 0.76; Potassium 3.7; Sodium 135   Lipid Panel    Component Value Date/Time   CHOL 139 07/19/2016 0953   TRIG 104.0 07/19/2016 0953   HDL 47.90 07/19/2016 0953   CHOLHDL 3 07/19/2016 0953   VLDL 20.8 07/19/2016 0953   LDLCALC 70 07/19/2016 0953     Other studies Reviewed: Additional studies/ records that were reviewed today with results demonstrating: .   ASSESSMENT AND PLAN:  1. CAD : angina controlled on medical therapy.  COntinue current meds.  I encouraged healthy life style modifications such as weight loss and regular exercise. [place] CTO of RCA.  No PCI attempt due to bleeding risk.  Reconsider if sx change. 2. Old MI: No CHF sx.   3. Hyperlipidemia: Continue current lipid-lowering medicines. LDL at  target. 4. Diabetes: Managed by primary care. Whatever lifestyle changes he implement for diabetes control will be good for his heart. Most importantly, he needs to exercise more. 5. Tobacco abuse: He needs to stop smoking. He has cut back significantly but has not stopped altogether.   Current medicines are reviewed at length with the patient today.  The patient concerns regarding his medicines were addressed.  The following changes have been made:  No change  Labs/ tests ordered today include:  No orders of the defined types were placed in this encounter.   Recommend 150 minutes/week of aerobic exercise Low fat, low carb, high fiber diet recommended  Disposition:   FU in 1 year   Signed, Larae Grooms, MD  08/04/2016 11:40 AM    [facility] [address], [place],   [address] Phone: [phone]; Fax: [phone]

## 2016-08-04 NOTE — Patient Instructions (Signed)
**Note De-identified  Obfuscation** Medication Instructions:  Same-no changes  Labwork: None  Testing/Procedures: None  Follow-Up: Your physician wants you to follow-up in: 1 year. You will receive a reminder letter in the mail two months in advance. If you don't receive a letter, please call our office to schedule the follow-up appointment.      If you need a refill on your cardiac medications before your next appointment, please call your pharmacy.   

## 2016-09-19 ENCOUNTER — Other Ambulatory Visit: Payer: Self-pay | Admitting: Internal Medicine

## 2016-10-16 ENCOUNTER — Other Ambulatory Visit: Payer: Self-pay | Admitting: Internal Medicine

## 2016-11-01 ENCOUNTER — Encounter: Payer: Self-pay | Admitting: Internal Medicine

## 2016-11-01 NOTE — Telephone Encounter (Signed)
Error

## 2016-11-03 ENCOUNTER — Ambulatory Visit (INDEPENDENT_AMBULATORY_CARE_PROVIDER_SITE_OTHER): Payer: PPO | Admitting: Internal Medicine

## 2016-11-03 ENCOUNTER — Other Ambulatory Visit: Payer: Self-pay

## 2016-11-03 ENCOUNTER — Encounter: Payer: Self-pay | Admitting: Internal Medicine

## 2016-11-03 ENCOUNTER — Other Ambulatory Visit (INDEPENDENT_AMBULATORY_CARE_PROVIDER_SITE_OTHER): Payer: PPO

## 2016-11-03 VITALS — BP 120/72 | HR 71 | Ht 67.0 in | Wt 287.0 lb

## 2016-11-03 DIAGNOSIS — K7290 Hepatic failure, unspecified without coma: Secondary | ICD-10-CM | POA: Diagnosis not present

## 2016-11-03 DIAGNOSIS — K7581 Nonalcoholic steatohepatitis (NASH): Secondary | ICD-10-CM

## 2016-11-03 DIAGNOSIS — K7460 Unspecified cirrhosis of liver: Secondary | ICD-10-CM

## 2016-11-03 DIAGNOSIS — K7682 Hepatic encephalopathy: Secondary | ICD-10-CM

## 2016-11-03 DIAGNOSIS — K766 Portal hypertension: Secondary | ICD-10-CM | POA: Diagnosis not present

## 2016-11-03 DIAGNOSIS — I8510 Secondary esophageal varices without bleeding: Secondary | ICD-10-CM | POA: Diagnosis not present

## 2016-11-03 LAB — COMPREHENSIVE METABOLIC PANEL
ALT: 42 U/L (ref 0–53)
AST: 65 U/L — ABNORMAL HIGH (ref 0–37)
Albumin: 3.1 g/dL — ABNORMAL LOW (ref 3.5–5.2)
Alkaline Phosphatase: 236 U/L — ABNORMAL HIGH (ref 39–117)
BUN: 6 mg/dL (ref 6–23)
CALCIUM: 8.8 mg/dL (ref 8.4–10.5)
CO2: 29 mEq/L (ref 19–32)
Chloride: 103 mEq/L (ref 96–112)
Creatinine, Ser: 0.78 mg/dL (ref 0.40–1.50)
GFR: 107.67 mL/min (ref >60.00)
GLUCOSE: 239 mg/dL — AB (ref 70–99)
Potassium: 4.2 mEq/L (ref 3.5–5.1)
Sodium: 136 mEq/L (ref 135–145)
TOTAL PROTEIN: 7.2 g/dL (ref 6.0–8.3)
Total Bilirubin: 2.2 mg/dL — ABNORMAL HIGH (ref 0.2–1.2)

## 2016-11-03 LAB — CBC WITH DIFFERENTIAL/PLATELET
BASOS ABS: 0 10*3/uL (ref 0.0–0.1)
BASOS PCT: 0.8 % (ref 0.0–3.0)
EOS PCT: 5.7 % — AB (ref 0.0–5.0)
Eosinophils Absolute: 0.3 10*3/uL (ref 0.0–0.7)
HEMATOCRIT: 42.4 % (ref 39.0–52.0)
HEMOGLOBIN: 14.4 g/dL (ref 13.0–17.0)
LYMPHS ABS: 1.1 10*3/uL (ref 0.7–4.0)
LYMPHS PCT: 20.9 % (ref 12.0–46.0)
MCHC: 34 g/dL (ref 30.0–36.0)
MCV: 102.8 fl — AB (ref 78.0–100.0)
MONOS PCT: 13.3 % — AB (ref 3.0–12.0)
Monocytes Absolute: 0.7 10*3/uL (ref 0.1–1.0)
NEUTROS ABS: 3.1 10*3/uL (ref 1.4–7.7)
NEUTROS PCT: 59.3 % (ref 43.0–77.0)
Platelets: 61 10*3/uL — ABNORMAL LOW (ref 150.0–400.0)
RBC: 4.12 Mil/uL — AB (ref 4.22–5.81)
RDW: 15.4 % (ref 11.5–15.5)
WBC: 5.2 10*3/uL (ref 4.0–10.5)

## 2016-11-03 LAB — PROTIME-INR
INR: 1.2 ratio — ABNORMAL HIGH (ref 0.8–1.0)
Prothrombin Time: 13.1 s (ref 9.6–13.1)

## 2016-11-03 MED ORDER — NADOLOL 40 MG PO TABS: 80.0000 mg | ORAL_TABLET | Freq: Every day | ORAL | 4 refills | Status: DC

## 2016-11-03 NOTE — Progress Notes (Signed)
Subjective:    Patient ID: Adam Butler, male    DOB: July 10, 1956, 61 y.o.   MRN: [id number]  HPI Adam Butler is a 61 year old male with NASH cirrhosis with portal hypertension (history of esophageal varices, hepatic encephalopathy), CHF, diabetes, hyperlipidemia and obesity who is here for follow-up. He was last seen in the office in November 2017 by Nicoletta Ba, PA-C and for surveillance upper endoscopy on 07/12/2016. Upper endoscopy revealed large nonbleeding esophageal varices without stigmata. There was mild portal hypertensive gastropathy. Evidence of gastroparesis due to retained gastric contents and a normal examined duodenum.  He reports he's been doing very well. He continues lactulose 45 ML's 3 times a day. With this he has 6-10 stools per day but denies diarrhea. Stools are soft but not watery. He denies any issues with confusion or sleepiness. He has not been hospitalized with encephalopathy in some time. He is now off of rifaximin. He is taking nadolol 40 mg daily. He uses spironolactone 12.5 mg daily and furosemide 40 mg daily. He denies bleeding, blood in his stool and melena. He tries the low-salt diet. No alcohol use.  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 He reports this stimulates his mind keeps him busy. He will work on it until he gets tired and then rest. He is very happy with the way he has been feeling recently. He has not needed nitroglycerin for chest pain.   Review of Systems As per history of present illness, otherwise negative  Current Medications, Allergies, Past Medical History, Past Surgical History, Family History and Social History were reviewed in [facility] record.      Objective:   Physical Exam BP 120/72   Pulse 71   Ht 5' 7"  (1.702 m)   Wt 287 lb (130.2 kg)   BMI 44.95 kg/m   Heart rate checked by me 72 after 15 minutes of sitting in the chair Constitutional:  Well-developed and well-nourished. No distress. HEENT: Normocephalic and atraumatic. Oropharynx is clear and moist. Conjunctivae are normal.  No scleral icterus. Neck: Neck supple. Trachea midline. Cardiovascular: Normal rate, regular rhythm and intact distal pulses. No M/R/G Pulmonary/chest: Effort normal and breath sounds normal. No wheezing, rales or rhonchi. Abdominal: Soft, obese, nontender, nondistended. Bowel sounds active throughout. Extremities: no clubbing, cyanosis trace pretibial edema,  Neurological: Alert and oriented to person place and time. no asterixis  Skin: Skin is warm and dry. Psychiatric: Normal mood and affect. Behavior is normal.  CBC    Component Value Date/Time   WBC 5.6 05/30/2016 1427   RBC 3.87 (L) 05/30/2016 1427   HGB 12.7 (L) 05/30/2016 1427   HCT 37.1 (L) 05/30/2016 1427   PLT 70.0 (L) 05/30/2016 1427   MCV 95.9 05/30/2016 1427   MCH 32.9 02/24/2016 0736   MCHC 34.2 05/30/2016 1427   RDW 16.5 (H) 05/30/2016 1427   LYMPHSABS 1.5 05/30/2016 1427   MONOABS 0.6 05/30/2016 1427   EOSABS 0.3 05/30/2016 1427   BASOSABS 0.0 05/30/2016 1427   CMP     Component Value Date/Time   NA 135 07/19/2016 0953   K 3.7 07/19/2016 0953   CL 101 07/19/2016 0953   CO2 26 07/19/2016 0953   GLUCOSE 236 (H) 07/19/2016 0953   BUN 11 07/19/2016 0953   CREATININE 0.76 07/19/2016 0953   CREATININE 0.74 03/15/2011 0827   CALCIUM 9.7 07/19/2016 0953   PROT 7.5 07/19/2016 0953   ALBUMIN 3.1 (  L) 07/19/2016 0953   AST 62 (H) 07/19/2016 0953   ALT 46 07/19/2016 0953   ALKPHOS 215 (H) 07/19/2016 0953   BILITOT 1.8 (H) 07/19/2016 0953   GFRNONAA >60 02/24/2016 0736   GFRAA >60 02/24/2016 0736   Lab Results  Component Value Date   INR 1.3 (H) 05/30/2016   INR 1.43 12/17/2015   INR 1.3 (H) 12/01/2015   MRI abd - Aug 2017    Assessment & Plan:  61 year old male with NASH cirrhosis with portal hypertension (history of esophageal varices, hepatic encephalopathy), CHF,  diabetes, hyperlipidemia and obesity who is here for follow-up.  1. NASH cirrhosis -- He has done remarkably well recently despite having significant cirrhosis and portal hypertension. These problems are currently stable --Hepatic encephalopathy -- currently well controlled with current dose of lactulose. Continue 45 mL's 3 times a day. Currently not needing rifaximin --Esophageal varices -- large and on nadolol. Resting heart rate 72 thus I am increasing nadolol to 80 mg daily. Goal heart rate 60 bpm while resting --HCC screening -- negative by MRI and August 2017. Repeat MRI August 2018 --History of small volume ascites and lower extremity edema -- check CBC, CMP and INR today. May need to dose titrate diuretics. For now continue Lasix 40 mg and Aldactone 12.5 mg daily. Would increase Aldactone if potassium normal. --Low sodium diet  2. Congestive heart failure -- followed by cardiology  3. Insulin-dependent diabetes mellitus -- follows closely with primary care  6 month follow-up, sooner if necessary. Discussed repeat MRI follow-up for [place] screening 25 minutes spent with the patient today. Greater than 50% was spent in counseling and coordination of care with the patient

## 2016-11-03 NOTE — Patient Instructions (Signed)
Your physician has requested that you go to the basement for the following lab work before leaving today: CBC, CMP, INR  Continue lactulose, lasix, aldactone at current doses.  Increase your nadolol to 80 mg daily. We will send you a new prescription for this today.  Follow a low sodium diet. NO MORE than 2 grams daily.   Low-Sodium Eating Plan Sodium, which is an element that makes up salt, helps you maintain a healthy balance of fluids in your body. Too much sodium can increase your blood pressure and cause fluid and waste to be held in your body. Your health care provider or dietitian may recommend following this plan if you have high blood pressure (hypertension), kidney disease, liver disease, or heart failure. Eating less sodium can help lower your blood pressure, reduce swelling, and protect your heart, liver, and kidneys. What are tips for following this plan? General guidelines   Most people on this plan should limit their sodium intake to 1,500-2,000 mg (milligrams) of sodium each day. Reading food labels   The Nutrition Facts label lists the amount of sodium in one serving of the food. If you eat more than one serving, you must multiply the listed amount of sodium by the number of servings.  Choose foods with less than 140 mg of sodium per serving.  Avoid foods with 300 mg of sodium or more per serving. Shopping   Look for lower-sodium products, often labeled as "low-sodium" or "no salt added."  Always check the sodium content even if foods are labeled as "unsalted" or "no salt added".  Buy fresh foods.  Avoid canned foods and premade or frozen meals.  Avoid canned, cured, or processed meats  Buy breads that have less than 80 mg of sodium per slice. Cooking   Eat more home-cooked food and less restaurant, buffet, and fast food.  Avoid adding salt when cooking. Use salt-free seasonings or herbs instead of table salt or sea salt. Check with your health care provider  or pharmacist before using salt substitutes.  Cook with plant-based oils, such as canola, sunflower, or olive oil. Meal planning   When eating at a restaurant, ask that your food be prepared with less salt or no salt, if possible.  Avoid foods that contain MSG (monosodium glutamate). MSG is sometimes added to [place] food, bouillon, and some canned foods. What foods are recommended? The items listed may not be a complete list. Talk with your dietitian about what dietary choices are best for you. Grains  Low-sodium cereals, including oats, puffed wheat and rice, and shredded wheat. Low-sodium crackers. Unsalted rice. Unsalted pasta. Low-sodium bread. Whole-grain breads and whole-grain pasta. Vegetables  Fresh or frozen vegetables. "No salt added" canned vegetables. "No salt added" tomato sauce and paste. Low-sodium or reduced-sodium tomato and vegetable juice. Fruits  Fresh, frozen, or canned fruit. Fruit juice. Meats and other protein foods  Fresh or frozen (no salt added) meat, poultry, seafood, and fish. Low-sodium canned tuna and salmon. Unsalted nuts. Dried peas, beans, and lentils without added salt. Unsalted canned beans. Eggs. Unsalted nut butters. Dairy  Milk. Soy milk. Cheese that is naturally low in sodium, such as ricotta cheese, fresh mozzarella, or Swiss cheese Low-sodium or reduced-sodium cheese. Cream cheese. Yogurt. Fats and oils  Unsalted butter. Unsalted margarine with no trans fat. Vegetable oils such as canola or olive oils. Seasonings and other foods  Fresh and dried herbs and spices. Salt-free seasonings. Low-sodium mustard and ketchup. Sodium-free salad dressing. Sodium-free light mayonnaise. Fresh  or refrigerated horseradish. Lemon juice. Vinegar. Homemade, reduced-sodium, or low-sodium soups. Unsalted popcorn and pretzels. Low-salt or salt-free chips. What foods are not recommended? The items listed may not be a complete list. Talk with your dietitian about what  dietary choices are best for you. Grains  Instant hot cereals. Bread stuffing, pancake, and biscuit mixes. Croutons. Seasoned rice or pasta mixes. Noodle soup cups. Boxed or frozen macaroni and cheese. Regular salted crackers. Self-rising flour. Vegetables  Sauerkraut, pickled vegetables, and relishes. Olives. [place] fries. Onion rings. Regular canned vegetables (not low-sodium or reduced-sodium). Regular canned tomato sauce and paste (not low-sodium or reduced-sodium). Regular tomato and vegetable juice (not low-sodium or reduced-sodium). Frozen vegetables in sauces. Meats and other protein foods  Meat or fish that is salted, canned, smoked, spiced, or pickled. Bacon, ham, sausage, hotdogs, corned beef, chipped beef, packaged lunch meats, salt pork, jerky, pickled herring, anchovies, regular canned tuna, sardines, salted nuts. Dairy  Processed cheese and cheese spreads. Cheese curds. Blue cheese. Feta cheese. String cheese. Regular cottage cheese. Buttermilk. Canned milk. Fats and oils  Salted butter. Regular margarine. Ghee. Bacon fat. Seasonings and other foods  Onion salt, garlic salt, seasoned salt, table salt, and sea salt. Canned and packaged gravies. Worcestershire sauce. Tartar sauce. Barbecue sauce. Teriyaki sauce. Soy sauce, including reduced-sodium. Steak sauce. Fish sauce. Oyster sauce. Cocktail sauce. Horseradish that you find on the shelf. Regular ketchup and mustard. Meat flavorings and tenderizers. Bouillon cubes. Hot sauce and Tabasco sauce. Premade or packaged marinades. Premade or packaged taco seasonings. Relishes. Regular salad dressings. Salsa. Potato and tortilla chips. Corn chips and puffs. Salted popcorn and pretzels. Canned or dried soups. Pizza. Frozen entrees and pot pies. Summary  Eating less sodium can help lower your blood pressure, reduce swelling, and protect your heart, liver, and kidneys.  Most people on this plan should limit their sodium intake to 1,500-2,000 mg  (milligrams) of sodium each day.  Canned, boxed, and frozen foods are high in sodium. Restaurant foods, fast foods, and pizza are also very high in sodium. You also get sodium by adding salt to food.  Try to cook at home, eat more fresh fruits and vegetables, and eat less fast food, canned, processed, or prepared foods. This information is not intended to replace advice given to you by your health care provider. Make sure you discuss any questions you have with your health care provider. Document Released: 12/17/2001 Document Revised: 06/20/2016 Document Reviewed: 06/20/2016 Elsevier Interactive Patient Education  2017 [employer].

## 2016-11-07 ENCOUNTER — Telehealth: Payer: Self-pay | Admitting: Internal Medicine

## 2016-11-07 NOTE — Telephone Encounter (Signed)
Pt states that since the nadolol was increased he is constantly sleepy.  He sleeps all night and every time he gets still he falls asleep all day.  Please advise

## 2016-11-07 NOTE — Telephone Encounter (Signed)
Have him reduce nadolol to 60 mg daily (previously 40 mg daily).  I doubled it last week given resting HR > 60.   This reduction will likely make the med better tolerated

## 2016-11-07 NOTE — Telephone Encounter (Signed)
Pt has been advised to decrease to 60 mg daily of nadolol.  He will call if symptoms do not resolve.

## 2016-11-11 ENCOUNTER — Other Ambulatory Visit: Payer: Self-pay

## 2016-11-11 MED ORDER — ATORVASTATIN CALCIUM 10 MG PO TABS: 10.0000 mg | ORAL_TABLET | Freq: Every day | ORAL | 0 refills | Status: DC

## 2016-11-17 ENCOUNTER — Encounter: Payer: Self-pay | Admitting: Internal Medicine

## 2016-11-17 ENCOUNTER — Ambulatory Visit (INDEPENDENT_AMBULATORY_CARE_PROVIDER_SITE_OTHER): Payer: PPO | Admitting: Internal Medicine

## 2016-11-17 VITALS — BP 114/64 | HR 65 | Temp 97.7°F | Ht 66.5 in | Wt 286.0 lb

## 2016-11-17 DIAGNOSIS — E119 Type 2 diabetes mellitus without complications: Secondary | ICD-10-CM | POA: Diagnosis not present

## 2016-11-17 DIAGNOSIS — Z0000 Encounter for general adult medical examination without abnormal findings: Secondary | ICD-10-CM

## 2016-11-17 DIAGNOSIS — Z125 Encounter for screening for malignant neoplasm of prostate: Secondary | ICD-10-CM

## 2016-11-17 DIAGNOSIS — Z794 Long term (current) use of insulin: Secondary | ICD-10-CM | POA: Diagnosis not present

## 2016-11-17 LAB — CBC
HCT: 40.7 % (ref 39.0–52.0)
Hemoglobin: 13.9 g/dL (ref 13.0–17.0)
MCHC: 34.1 g/dL (ref 30.0–36.0)
MCV: 102.9 fl — AB (ref 78.0–100.0)
PLATELETS: 76 10*3/uL — AB (ref 150.0–400.0)
RBC: 3.95 Mil/uL — AB (ref 4.22–5.81)
RDW: 15.2 % (ref 11.5–15.5)
WBC: 5.7 10*3/uL (ref 4.0–10.5)

## 2016-11-17 LAB — COMPREHENSIVE METABOLIC PANEL
ALT: 42 U/L (ref 0–53)
AST: 60 U/L — AB (ref 0–37)
Albumin: 2.9 g/dL — ABNORMAL LOW (ref 3.5–5.2)
Alkaline Phosphatase: 280 U/L — ABNORMAL HIGH (ref 39–117)
BUN: 7 mg/dL (ref 6–23)
CALCIUM: 9.1 mg/dL (ref 8.4–10.5)
CHLORIDE: 102 meq/L (ref 96–112)
CO2: 31 mEq/L (ref 19–32)
CREATININE: 0.75 mg/dL (ref 0.40–1.50)
GFR: 112.64 mL/min (ref >60.00)
Glucose, Bld: 192 mg/dL — ABNORMAL HIGH (ref 70–99)
Potassium: 3.9 mEq/L (ref 3.5–5.1)
Sodium: 137 mEq/L (ref 135–145)
Total Bilirubin: 2.1 mg/dL — ABNORMAL HIGH (ref 0.2–1.2)
Total Protein: 6.8 g/dL (ref 6.0–8.3)

## 2016-11-17 LAB — LIPID PANEL
Cholesterol: 133 mg/dL (ref 0–200)
HDL: 39.9 mg/dL (ref >39.00)
LDL Cholesterol: 67 mg/dL (ref 0–99)
NonHDL: 93.37
TRIGLYCERIDES: 133 mg/dL (ref 0.0–149.0)
Total CHOL/HDL Ratio: 3
VLDL: 26.6 mg/dL (ref 0.0–40.0)

## 2016-11-17 LAB — PSA, MEDICARE: PSA: 0.23 ng/mL (ref 0.10–4.00)

## 2016-11-17 LAB — HEMOGLOBIN A1C: Hgb A1c MFr Bld: 9.7 % — ABNORMAL HIGH (ref 4.6–6.5)

## 2016-11-17 NOTE — Patient Instructions (Signed)
 Health Maintenance, Male A healthy lifestyle and preventive care is important for your health and wellness. Ask your health care provider about what schedule of regular examinations is right for you. What should I know about weight and diet?  Eat a Healthy Diet  Eat plenty of vegetables, fruits, whole grains, low-fat dairy products, and lean protein.  Do not eat a lot of foods high in solid fats, added sugars, or salt. Maintain a Healthy Weight  Regular exercise can help you achieve or maintain a healthy weight. You should:  Do at least 150 minutes of exercise each week. The exercise should increase your heart rate and make you sweat (moderate-intensity exercise).  Do strength-training exercises at least twice a week. Watch Your Levels of Cholesterol and Blood Lipids  Have your blood tested for lipids and cholesterol every 5 years starting at 61 years of age. If you are at high risk for heart disease, you should start having your blood tested when you are 61 years old. You may need to have your cholesterol levels checked more often if:  Your lipid or cholesterol levels are high.  You are older than 61 years of age.  You are at high risk for heart disease. What should I know about cancer screening? Many types of cancers can be detected early and may often be prevented. Lung Cancer  You should be screened every year for lung cancer if:  You are a current smoker who has smoked for at least 30 years.  You are a former smoker who has quit within the past 15 years.  Talk to your health care provider about your screening options, when you should start screening, and how often you should be screened. Colorectal Cancer  Routine colorectal cancer screening usually begins at 61 years of age and should be repeated every 5-10 years until you are 61 years old. You may need to be screened more often if early forms of precancerous polyps or small growths are found. Your health care provider  may recommend screening at an earlier age if you have risk factors for colon cancer.  Your health care provider may recommend using home test kits to check for hidden blood in the stool.  A small camera at the end of a tube can be used to examine your colon (sigmoidoscopy or colonoscopy). This checks for the earliest forms of colorectal cancer. Prostate and Testicular Cancer  Depending on your age and overall health, your health care provider may do certain tests to screen for prostate and testicular cancer.  Talk to your health care provider about any symptoms or concerns you have about testicular or prostate cancer. Skin Cancer  Check your skin from head to toe regularly.  Tell your health care provider about any new moles or changes in moles, especially if:  There is a change in a mole's size, shape, or color.  You have a mole that is larger than a pencil eraser.  Always use sunscreen. Apply sunscreen liberally and repeat throughout the day.  Protect yourself by wearing long sleeves, pants, a wide-brimmed hat, and sunglasses when outside. What should I know about heart disease, diabetes, and high blood pressure?  If you are 18-39 years of age, have your blood pressure checked every 3-5 years. If you are 40 years of age or older, have your blood pressure checked every year. You should have your blood pressure measured twice-once when you are at a hospital or clinic, and once when you are not at   a hospital or clinic. Record the average of the two measurements. To check your blood pressure when you are not at a hospital or clinic, you can use:  An automated blood pressure machine at a pharmacy.  A home blood pressure monitor.  Talk to your health care provider about your target blood pressure.  If you are between 45-79 years old, ask your health care provider if you should take aspirin to prevent heart disease.  Have regular diabetes screenings by checking your fasting blood sugar  level.  If you are at a normal weight and have a low risk for diabetes, have this test once every three years after the age of 45.  If you are overweight and have a high risk for diabetes, consider being tested at a younger age or more often.  A one-time screening for abdominal aortic aneurysm (AAA) by ultrasound is recommended for men aged 65-75 years who are current or former smokers. What should I know about preventing infection? Hepatitis B  If you have a higher risk for hepatitis B, you should be screened for this virus. Talk with your health care provider to find out if you are at risk for hepatitis B infection. Hepatitis C  Blood testing is recommended for:  Everyone born from 1945 through 1965.  Anyone with known risk factors for hepatitis C. Sexually Transmitted Diseases (STDs)  You should be screened each year for STDs including gonorrhea and chlamydia if:  You are sexually active and are younger than 61 years of age.  You are older than 61 years of age and your health care provider tells you that you are at risk for this type of infection.  Your sexual activity has changed since you were last screened and you are at an increased risk for chlamydia or gonorrhea. Ask your health care provider if you are at risk.  Talk with your health care provider about whether you are at high risk of being infected with HIV. Your health care provider may recommend a prescription medicine to help prevent HIV infection. What else can I do?  Schedule regular health, dental, and eye exams.  Stay current with your vaccines (immunizations).  Do not use any tobacco products, such as cigarettes, chewing tobacco, and e-cigarettes. If you need help quitting, ask your health care provider.  Limit alcohol intake to no more than 2 drinks per day. One drink equals 12 ounces of beer, 5 ounces of wine, or 1 ounces of hard liquor.  Do not use street drugs.  Do not share needles.  Ask your health  care provider for help if you need support or information about quitting drugs.  Tell your health care provider if you often feel depressed.  Tell your health care provider if you have ever been abused or do not feel safe at home. This information is not intended to replace advice given to you by your health care provider. Make sure you discuss any questions you have with your health care provider. Document Released: 12/24/2007 Document Revised: 02/24/2016 Document Reviewed: 03/31/2015 Elsevier Interactive Patient Education  2017 [employer]  

## 2016-11-17 NOTE — Progress Notes (Signed)
Pre visit review using our clinic review tool, if applicable. No additional management support is needed unless otherwise documented below in the visit note. 

## 2016-11-17 NOTE — Progress Notes (Signed)
HPI:  Pt presents to the clinic today for his Medicare Wellness Exam. He is also due to follow up DM2. His last A1C was 10.2%, 07/2016. He was advised to continue his Metformin. We added in Glipizide 10 mg BID and Levemir 25 units daily. He has been taking the medications as prescribed. He sugars range 120-180. He does not check his feet. His last eye exam was 05/2016.  Past Medical History:  Diagnosis Date  . Arthritis   . Cholelithiasis   . Cirrhosis ([place]) 2011   Cryptogenic, Likely NASH. Family/pt deny EtOH. HCV, HBV, HAV negative. ANA negative. AMA positive. Ascites 12/11  . Coronary artery disease    Inferior MI 12/11; LHC with occluded mid CFX and 80% proximal RCA. EF 55%. He had 3.0 x 28 vision BMS to CFX  . Diastolic CHF, acute ([place])    Echo 12/11 with ef 50-55% and mild LVH. EF 55% by LV0gram in 12/11  . Esophageal varices ([place]) 2011, 2013   no hx acute variceal bleed  . Hepatic encephalopathy ([place]) 2011, 12/2013  . Hyperlipemia   . Myocardial infarction [facility]) ? 2012  . PFO (patent foramen ovale): Per TEE 04/20/2015 04/20/2015  . Portal hypertension ([place])   . Rectal varices   . S/P coronary artery stent placement 06/2010  . SVT (supraventricular tachycardia) ([place])    1/12: appeared to be an ectopic atrial tachycardia. Required DCCV with hemodynamic instability  . Type II diabetes mellitus ([place]) 2011    Current Outpatient Prescriptions  Medication Sig Dispense Refill  . aspirin 81 MG tablet Take 81 mg by mouth daily.     Marland Kitchen atorvastatin (LIPITOR) 10 MG tablet Take 1 tablet (10 mg total) by mouth daily. 90 tablet 0  . cyanocobalamin 1000 MCG tablet Take 1,000 mcg by mouth daily.    . furosemide (LASIX) 40 MG tablet Take 1 tablet (40 mg total) by mouth every morning. 90 tablet 3  . glipiZIDE (GLUCOTROL) 10 MG tablet Take 1 tablet (10 mg total) by mouth 2 (two) times daily before a meal. 60 tablet 4  . Insulin Detemir (LEVEMIR) 100 UNIT/ML Pen Inject 24 Units into the skin daily  at 10 pm. 15 mL 3  . lactulose (CHRONULAC) 10 GM/15ML solution TAKE 45 ML BY MOUTH THREE TIMES DAILY ON SCHEDULE IF NO BOWEL BY 1 PM TAKE ADDITIONAL DOSE 4730 mL 1  . metFORMIN (GLUCOPHAGE) 1000 MG tablet TAKE ONE TABLET BY MOUTH TWICE DAILY 60 tablet 1  . Multiple Vitamin (MULTIVITAMIN) capsule Take 1 capsule by mouth daily.      . nadolol (CORGARD) 40 MG tablet Take 2 tablets (80 mg total) by mouth daily. 60 tablet 4  . nitroGLYCERIN (NITROSTAT) 0.4 MG SL tablet Place 1 tablet (0.4 mg total) under the tongue every 5 (five) minutes as needed for chest pain (chest pain). 25 tablet 5  . ranitidine (ZANTAC) 150 MG tablet Take 150 mg by mouth daily.    Marland Kitchen spironolactone (ALDACTONE) 25 MG tablet Take 0.5 tablets (12.5 mg total) by mouth daily. (Patient taking differently: Take 25 mg by mouth daily. ) 30 tablet 6   No current facility-administered medications for this visit.     Allergies  Allergen Reactions  . Isosorbide Nitrate Other (See Comments)    Nose bleeds    Family History  Problem Relation Age of Onset  . Heart attack Brother 49       MI  . Diabetes Brother   . Heart attack Father   .  Diabetes Father   . COPD Father   . COPD Mother   . Heart attack Brother   . Stroke Neg Hx     Social History   Social History  . Marital status: Married    Spouse name: N/A  . Number of children: 3  . Years of education: N/A   Occupational History  . Unemployed Other    Worked in maintenance prior   Social History Main Topics  . Smoking status: Current Every Day Smoker    Packs/day: 0.25    Years: 36.00    Types: E-cigarettes, Cigarettes  . Smokeless tobacco: Never Used     Comment: uses vapor cigarettes (2016 ); I smoke about 4 cigarettes a day  . Alcohol use No  . Drug use: No  . Sexual activity: No   Other Topics Concern  . Not on file   Social History Narrative   Married   Gets regular exercise: walking    Hospitiliaztions: None  Health Maintenance:    Flu:  07/2016  Tetanus: > 10 years ago  Pneumovax: 03/2015  Zostavax: never  PSA: 10/2015  Colon Screening: 04/2012  Eye Doctor: 05/2016  Dental Exam: as needed   Providers:   PCP: [name], NP-C  Gastroenterology: Dr. Hilarie Fredrickson  Cardiologist: Dr. Irish Lack   I have personally reviewed and have noted:  1. The patient's medical and social history 2. Their use of alcohol, tobacco or illicit drugs 3. Their current medications and supplements 4. The patient's functional ability including ADL's, fall risks, home safety risks and hearing or visual impairment. 5. Diet and physical activities 6. Evidence for depression or mood disorder  Subjective:   Review of Systems:   Constitutional: Denies fever, malaise, fatigue, headache or abrupt weight changes.  Respiratory: Denies difficulty breathing, shortness of breath, cough or sputum production.   Cardiovascular: Denies chest pain, chest tightness, palpitations or swelling in the hands or feet.  Gastrointestinal: Pt reports loose stools. Denies abdominal pain, bloating, constipation, or blood in the stool.  GU: Denies urgency, frequency, pain with urination, burning sensation, blood in urine, odor or discharge. Musculoskeletal: Denies decrease in range of motion, difficulty with gait, muscle pain or joint pain and swelling.  Skin: Denies redness, rashes, lesions or ulcercations.  Neurological: Denies dizziness, difficulty with memory, difficulty with speech or problems with balance and coordination.  Psych: Denies anxiety, depression, SI/HI.  No other specific complaints in a complete review of systems (except as listed in HPI above).  Objective:  PE:   BP 114/64 (BP Location: Right Arm, Patient Position: Sitting, Cuff Size: Large)   Pulse 65   Temp 97.7 F (36.5 C) (Oral)   Ht 5' 6.5" (1.689 m)   Wt 286 lb (129.7 kg)   SpO2 97%   BMI 45.47 kg/m   Wt Readings from Last 3 Encounters:  11/03/16 287 lb (130.2 kg)  08/04/16 286 lb  3.2 oz (129.8 kg)  07/19/16 278 lb (126.1 kg)    General: Appears his stated age, obese in NAD. Skin: Multiple skin tags noted on neck. Cardiovascular: Normal rate and rhythm. S1,S2 noted.  No murmur, rubs or gallops noted. No carotid bruits noted. Pulmonary/Chest: Normal effort and positive vesicular breath sounds. No respiratory distress. No wheezes, rales or ronchi noted.  Neurological: Alert and oriented. Psychiatric: Mood and affect normal. He makes inappropriate comments (When are you going to come camping with me, I could just sit here and stare at you all day long)   BMET  Component Value Date/Time   NA 136 11/03/2016 1012   K 4.2 11/03/2016 1012   CL 103 11/03/2016 1012   CO2 29 11/03/2016 1012   GLUCOSE 239 (H) 11/03/2016 1012   BUN 6 11/03/2016 1012   CREATININE 0.78 11/03/2016 1012   CREATININE 0.74 03/15/2011 0827   CALCIUM 8.8 11/03/2016 1012   GFRNONAA >60 02/24/2016 0736   GFRAA >60 02/24/2016 0736    Lipid Panel     Component Value Date/Time   CHOL 139 07/19/2016 0953   TRIG 104.0 07/19/2016 0953   HDL 47.90 07/19/2016 0953   CHOLHDL 3 07/19/2016 0953   VLDL 20.8 07/19/2016 0953   LDLCALC 70 07/19/2016 0953    CBC    Component Value Date/Time   WBC 5.2 11/03/2016 1012   RBC 4.12 (L) 11/03/2016 1012   HGB 14.4 11/03/2016 1012   HCT 42.4 11/03/2016 1012   PLT 61.0 Repeated and verified X2. (L) 11/03/2016 1012   MCV 102.8 (H) 11/03/2016 1012   MCH 32.9 02/24/2016 0736   MCHC 34.0 11/03/2016 1012   RDW 15.4 11/03/2016 1012   LYMPHSABS 1.1 11/03/2016 1012   MONOABS 0.7 11/03/2016 1012   EOSABS 0.3 11/03/2016 1012   BASOSABS 0.0 11/03/2016 1012    Hgb A1C Lab Results  Component Value Date   HGBA1C 10.2 (H) 07/19/2016      Assessment and Plan:   Medicare Annual Wellness Visit:  Diet: He does eat meat. He does not eat any fruits and veggies. He does eat some fried foods. He is drinking mostly water. Physical activity:  None Depression/mood screen: Negative Hearing: Intact to whispered voice Visual acuity: Grossly normal ADLs: Capable Fall risk: None Home safety: Good Cognitive evaluation: Intact to orientation, naming, recall and repetition EOL planning: Adv directives, full code/ I agree  Preventative Medicine: Flu shot UTD. He declines tetanus booster. Pneumovax UTD. He declines shingles vaccine. PSA screening today with labs. Colonoscopy UTD. Encouraged him to consume a balanced diet and exercise regimen. Encouraged him to see an eye doctor and dentist annually. Will check CBC, CMET, Lipid profile today   Next appointment: Depends on A1C level, will follow up after labs are resulted   [name], NP

## 2016-11-17 NOTE — Assessment & Plan Note (Signed)
Uncontrolled A1C today Continue Metformin, Glipizide and Levemir, will adjust if needed Discussed the importance of foot care Encouraged him to see an eye doctor annually Microalbumin checked 07/2016 Encouraged him to consume a balanced diet and exercise regimen Encouraged weight loss

## 2016-11-21 MED ORDER — SITAGLIPTIN PHOSPHATE 100 MG PO TABS: 100.0000 mg | ORAL_TABLET | Freq: Every day | ORAL | 3 refills | Status: DC

## 2016-11-21 NOTE — Addendum Note (Signed)
Addended by: Lurlean Nanny on: 11/21/2016 02:33 PM   Modules accepted: Orders

## 2016-12-15 ENCOUNTER — Other Ambulatory Visit: Payer: Self-pay | Admitting: Internal Medicine

## 2016-12-18 ENCOUNTER — Other Ambulatory Visit: Payer: Self-pay | Admitting: Internal Medicine

## 2016-12-19 ENCOUNTER — Ambulatory Visit (INDEPENDENT_AMBULATORY_CARE_PROVIDER_SITE_OTHER): Payer: PPO | Admitting: Internal Medicine

## 2016-12-19 ENCOUNTER — Encounter: Payer: Self-pay | Admitting: Internal Medicine

## 2016-12-19 VITALS — BP 136/76 | HR 72 | Temp 98.5°F | Wt 286.0 lb

## 2016-12-19 DIAGNOSIS — J069 Acute upper respiratory infection, unspecified: Secondary | ICD-10-CM

## 2016-12-19 DIAGNOSIS — B9789 Other viral agents as the cause of diseases classified elsewhere: Secondary | ICD-10-CM | POA: Diagnosis not present

## 2016-12-19 NOTE — Patient Instructions (Signed)
Upper Respiratory Infection, Adult Most upper respiratory infections (URIs) are caused by a virus. A URI affects the nose, throat, and upper air passages. The most common type of URI is often called "the common cold." Follow these instructions at home:  Take medicines only as told by your doctor.  Gargle warm saltwater or take cough drops to comfort your throat as told by your doctor.  Use a warm mist humidifier or inhale steam from a shower to increase air moisture. This may make it easier to breathe.  Drink enough fluid to keep your pee (urine) clear or pale yellow.  Eat soups and other clear broths.  Have a healthy diet.  Rest as needed.  Go back to work when your fever is gone or your doctor says it is okay. ? You may need to stay home longer to avoid giving your URI to others. ? You can also wear a face mask and wash your hands often to prevent spread of the virus.  Use your inhaler more if you have asthma.  Do not use any tobacco products, including cigarettes, chewing tobacco, or electronic cigarettes. If you need help quitting, ask your doctor. Contact a doctor if:  You are getting worse, not better.  Your symptoms are not helped by medicine.  You have chills.  You are getting more short of breath.  You have brown or red mucus.  You have yellow or brown discharge from your nose.  You have pain in your face, especially when you bend forward.  You have a fever.  You have puffy (swollen) neck glands.  You have pain while swallowing.  You have white areas in the back of your throat. Get help right away if:  You have very bad or constant: ? Headache. ? Ear pain. ? Pain in your forehead, behind your eyes, and over your cheekbones (sinus pain). ? Chest pain.  You have long-lasting (chronic) lung disease and any of the following: ? Wheezing. ? Long-lasting cough. ? Coughing up blood. ? A change in your usual mucus.  You have a stiff neck.  You have  changes in your: ? Vision. ? Hearing. ? Thinking. ? Mood. This information is not intended to replace advice given to you by your health care provider. Make sure you discuss any questions you have with your health care provider. Document Released: 12/14/2007 Document Revised: 02/28/2016 Document Reviewed: 10/02/2013 Elsevier Interactive Patient Education  2018 [employer] Inc[employer]  

## 2016-12-19 NOTE — Progress Notes (Signed)
HPI  Pt presents to the clinic today with c/o runny nose, nasal congestion, ear fullness and cough. This started 5 days ago. He is blowing clear mucous out of his nose. He denies ear pain or decreased hearing. The cough is nonproductive. He denies fever, chills or body aches. He has tried Mucinex with some relief. He does have some seasonal allergies. He has not had sick contacts that he is aware of.  Review of Systems        Past Medical History:  Diagnosis Date  . Arthritis   . Cholelithiasis   . Cirrhosis ([place]) 2011   Cryptogenic, Likely NASH. Family/pt deny EtOH. HCV, HBV, HAV negative. ANA negative. AMA positive. Ascites 12/11  . Coronary artery disease    Inferior MI 12/11; LHC with occluded mid CFX and 80% proximal RCA. EF 55%. He had 3.0 x 28 vision BMS to CFX  . Diastolic CHF, acute ([place])    Echo 12/11 with ef 50-55% and mild LVH. EF 55% by LV0gram in 12/11  . Esophageal varices ([place]) 2011, 2013   no hx acute variceal bleed  . Hepatic encephalopathy ([place]) 2011, 12/2013  . Hyperlipemia   . Myocardial infarction [facility]) ? 2012  . PFO (patent foramen ovale): Per TEE 04/20/2015 04/20/2015  . Portal hypertension ([place])   . Rectal varices   . S/P coronary artery stent placement 06/2010  . SVT (supraventricular tachycardia) ([place])    1/12: appeared to be an ectopic atrial tachycardia. Required DCCV with hemodynamic instability  . Type II diabetes mellitus ([place]) 2011    Family History  Problem Relation Age of Onset  . Heart attack Brother 85       MI  . Diabetes Brother   . Heart attack Father   . Diabetes Father   . COPD Father   . COPD Mother   . Heart attack Brother   . Stroke Neg Hx     Social History   Social History  . Marital status: Married    Spouse name: N/A  . Number of children: 3  . Years of education: N/A   Occupational History  . Unemployed Other    Worked in maintenance prior   Social History Main Topics  . Smoking status: Current Every Day Smoker     Packs/day: 0.25    Years: 36.00    Types: E-cigarettes, Cigarettes  . Smokeless tobacco: Never Used     Comment: uses vapor cigarettes (2016 ); I smoke about 4 cigarettes a day  . Alcohol use No  . Drug use: No  . Sexual activity: No   Other Topics Concern  . Not on file   Social History Narrative   Married   Gets regular exercise: walking    Allergies  Allergen Reactions  . Isosorbide Nitrate Other (See Comments)    Nose bleeds     Constitutional: Denies headache, fatigue, fever or abrupt weight changes.  HEENT:  Positive runny nose, nasal congestion, ear fullness. Denies eye redness, eye pain, pressure behind the eyes, facial pain, ear pain, ringing in the ears, wax buildup, or bloody nose. Respiratory: Positive cough. Denies difficulty breathing or shortness of breath.  Cardiovascular: Denies chest pain, chest tightness, palpitations or swelling in the hands or feet.   No other specific complaints in a complete review of systems (except as listed in HPI above).  Objective:   BP 136/76   Pulse 72   Temp 98.5 F (36.9 C) (Oral)   Wt 286 lb (129.7  kg)   SpO2 96%   BMI 45.47 kg/m  Wt Readings from Last 3 Encounters:  12/19/16 286 lb (129.7 kg)  11/17/16 286 lb (129.7 kg)  11/03/16 287 lb (130.2 kg)     General: Appears his stated age, obese in NAD. HEENT: Head: normal shape and size, no sinus tenderness noted; Ears: Tm's gray and intact, normal light reflex; Nose: mucosa pink and moist, septum midline; Throat/Mouth: + PND. Teeth present, mucosa pink and moist, no exudate noted, no lesions or ulcerations noted.  Neck: No cervical lymphadenopathy.  Pulmonary/Chest: Normal effort and diminished vesicular breath sounds. No respiratory distress. No wheezes, rales or ronchi noted.       Assessment & Plan:   Viral Upper Respiratory Infection with Cough:  Get some rest and drink plenty of water Continue Mucinex Add Flonase OTC Can use Delsym as needed for  cough  RTC as needed or if symptoms persist.   [name], NP

## 2017-01-01 ENCOUNTER — Other Ambulatory Visit: Payer: Self-pay | Admitting: Physician Assistant

## 2017-01-02 NOTE — Telephone Encounter (Signed)
Okay to refill nadolol

## 2017-01-12 ENCOUNTER — Other Ambulatory Visit: Payer: Self-pay | Admitting: Internal Medicine

## 2017-01-16 ENCOUNTER — Telehealth: Payer: Self-pay | Admitting: Internal Medicine

## 2017-01-16 MED ORDER — NADOLOL 40 MG PO TABS: ORAL_TABLET | ORAL | 2 refills | Status: DC

## 2017-01-16 MED ORDER — LACTULOSE 10 GM/15ML PO SOLN: ORAL | 2 refills | Status: DC

## 2017-01-16 NOTE — Telephone Encounter (Signed)
Rx refill sent for nadolol 60 mg daily and new rx sent for lactulose 45 ml up to 4 times daily #5400 ml. Patient verbalizes understanding.

## 2017-01-31 ENCOUNTER — Emergency Department (HOSPITAL_COMMUNITY): Payer: PPO

## 2017-01-31 ENCOUNTER — Encounter (HOSPITAL_COMMUNITY): Payer: Self-pay | Admitting: Emergency Medicine

## 2017-01-31 ENCOUNTER — Emergency Department (HOSPITAL_COMMUNITY)
Admission: EM | Admit: 2017-01-31 | Discharge: 2017-02-01 | Disposition: A | Discharge status: Home / Self Care | Payer: PPO | Attending: Emergency Medicine | Admitting: Emergency Medicine

## 2017-01-31 DIAGNOSIS — Z7982 Long term (current) use of aspirin: Secondary | ICD-10-CM | POA: Insufficient documentation

## 2017-01-31 DIAGNOSIS — Z7984 Long term (current) use of oral hypoglycemic drugs: Secondary | ICD-10-CM | POA: Diagnosis not present

## 2017-01-31 DIAGNOSIS — R079 Chest pain, unspecified: Secondary | ICD-10-CM | POA: Diagnosis not present

## 2017-01-31 DIAGNOSIS — I2510 Atherosclerotic heart disease of native coronary artery without angina pectoris: Secondary | ICD-10-CM | POA: Diagnosis not present

## 2017-01-31 DIAGNOSIS — I252 Old myocardial infarction: Secondary | ICD-10-CM | POA: Insufficient documentation

## 2017-01-31 DIAGNOSIS — E119 Type 2 diabetes mellitus without complications: Secondary | ICD-10-CM | POA: Diagnosis not present

## 2017-01-31 DIAGNOSIS — R0682 Tachypnea, not elsewhere classified: Secondary | ICD-10-CM | POA: Diagnosis not present

## 2017-01-31 DIAGNOSIS — Z79899 Other long term (current) drug therapy: Secondary | ICD-10-CM | POA: Insufficient documentation

## 2017-01-31 DIAGNOSIS — I5032 Chronic diastolic (congestive) heart failure: Secondary | ICD-10-CM | POA: Diagnosis not present

## 2017-01-31 DIAGNOSIS — Z955 Presence of coronary angioplasty implant and graft: Secondary | ICD-10-CM | POA: Diagnosis not present

## 2017-01-31 DIAGNOSIS — K7290 Hepatic failure, unspecified without coma: Secondary | ICD-10-CM | POA: Diagnosis not present

## 2017-01-31 DIAGNOSIS — R4182 Altered mental status, unspecified: Secondary | ICD-10-CM | POA: Diagnosis not present

## 2017-01-31 DIAGNOSIS — F17210 Nicotine dependence, cigarettes, uncomplicated: Secondary | ICD-10-CM | POA: Diagnosis not present

## 2017-01-31 DIAGNOSIS — K7682 Hepatic encephalopathy: Secondary | ICD-10-CM

## 2017-01-31 LAB — CBC
HCT: 37.6 % — ABNORMAL LOW (ref 39.0–52.0)
Hemoglobin: 13 g/dL (ref 13.0–17.0)
MCH: 34 pg (ref 26.0–34.0)
MCHC: 34.6 g/dL (ref 30.0–36.0)
MCV: 98.4 fL (ref 78.0–100.0)
Platelets: 65 K/uL — ABNORMAL LOW (ref 150–400)
RBC: 3.82 MIL/uL — ABNORMAL LOW (ref 4.22–5.81)
RDW: 14.1 % (ref 11.5–15.5)
WBC: 12.7 K/uL — ABNORMAL HIGH (ref 4.0–10.5)

## 2017-01-31 LAB — PROTIME-INR
INR: 1.4
PROTHROMBIN TIME: 17.3 s — AB (ref 11.4–15.2)

## 2017-01-31 LAB — I-STAT TROPONIN, ED: Troponin i, poc: 0 ng/mL (ref 0.00–0.08)

## 2017-01-31 NOTE — ED Provider Notes (Signed)
By signing my name below, I, Ephriam Jenkins, attest that this documentation has been prepared under the direction and in the presence of Ward, Delice Bison, DO. Electronically signed, Ephriam Jenkins, ED Scribe. 02/01/17. 12:12 AM.  TIME SEEN: 12:00 AM  CHIEF COMPLAINT: AMS  HPI:  HPI Comments: Adam Butler is a 61 y.o. male with Hx of DM, NASH and hepatic encephalopathy brought in by ambulance, who presents to the Emergency Department with family, with concerns for altered mental status this evening. Pt's wife states: "He is just off". When wife returned home from work, she notes that the pt was not acting right. She states that he was not speaking as much or acting his normal self. She is unable to describe this much further. Pt's wife checked his CBG and reports that it was 240 and administered insulin which did not resolve his noted mental status changes. He was alert and oriented x3 and is still currently. They went out to eat this evening and wife notes that the pt was shivering, which is not normal for him. Pt states that he took his temperature at home and reports that it was 97.9. He further notes a recent cough. Pt takes Lactulose daily and has had good BM's recently. Pt's wife states that she believes the pt is dehydrated as he has not had adequate fluid intake today. He denies any pain.   ROS: See HPI Constitutional: no fever  Eyes: no drainage  ENT: no runny nose   Cardiovascular:  no chest pain  Resp: no SOB  GI: no vomiting GU: no dysuria Integumentary: no rash  Allergy: no hives  Musculoskeletal: no leg swelling  Neurological: no slurred speech ROS otherwise negative  PAST MEDICAL HISTORY/PAST SURGICAL HISTORY:  Past Medical History:  Diagnosis Date  . Arthritis   . Cholelithiasis   . Cirrhosis ([place]) 2011   Cryptogenic, Likely NASH. Family/pt deny EtOH. HCV, HBV, HAV negative. ANA negative. AMA positive. Ascites 12/11  . Coronary artery disease    Inferior MI 12/11; LHC  with occluded mid CFX and 80% proximal RCA. EF 55%. He had 3.0 x 28 vision BMS to CFX  . Diastolic CHF, acute ([place])    Echo 12/11 with ef 50-55% and mild LVH. EF 55% by LV0gram in 12/11  . Esophageal varices ([place]) 2011, 2013   no hx acute variceal bleed  . Hepatic encephalopathy ([place]) 2011, 12/2013  . Hyperlipemia   . Myocardial infarction [facility]) ? 2012  . PFO (patent foramen ovale): Per TEE 04/20/2015 04/20/2015  . Portal hypertension ([place])   . Rectal varices   . S/P coronary artery stent placement 06/2010  . SVT (supraventricular tachycardia) ([place])    1/12: appeared to be an ectopic atrial tachycardia. Required DCCV with hemodynamic instability  . Type II diabetes mellitus ([place]) 2011    MEDICATIONS:  Prior to Admission medications   Medication Sig Start Date End Date Taking? Authorizing Provider  aspirin 81 MG tablet Take 81 mg by mouth daily.     [provider]  atorvastatin (LIPITOR) 10 MG tablet Take 1 tablet (10 mg total) by mouth daily. 11/11/16   [name], NP  cyanocobalamin 1000 MCG tablet Take 1,000 mcg by mouth daily.    [provider]  furosemide (LASIX) 40 MG tablet Take 1 tablet (40 mg total) by mouth every morning. Patient taking differently: Take 20 mg by mouth every morning.  05/30/16   Esterwood, Amy S, PA-C  glipiZIDE (GLUCOTROL) 10 MG tablet  TAKE ONE TABLET BY MOUTH TWICE DAILY BEFORE  A  MEAL. 12/19/16   [name], NP  lactulose (CONSTULOSE) 10 GM/15ML solution TAKE 45 ML BY MOUTH THREE TIMES DAILY ON SCHEDULE IF NO BOWEL BY 1 PM TAKE ADDITIONAL DOSE 01/16/17   Pyrtle, Lajuan Lines, MD  metFORMIN (GLUCOPHAGE) 1000 MG tablet TAKE 1 TABLET BY MOUTH TWICE DAILY 12/16/16   [name], NP  Multiple Vitamin (MULTIVITAMIN) capsule Take 1 capsule by mouth daily.      [provider]  nadolol (CORGARD) 40 MG tablet Take 1.5 tablets (60 mg) by mouth once daily 01/16/17   Pyrtle, Lajuan Lines, MD  nitroGLYCERIN (NITROSTAT) 0.4 MG SL tablet Place 1 tablet  (0.4 mg total) under the tongue every 5 (five) minutes as needed for chest pain (chest pain). 07/19/16   [name], NP  ranitidine (ZANTAC) 150 MG tablet Take 150 mg by mouth daily.    [provider]  sitaGLIPtin (JANUVIA) 100 MG tablet Take 1 tablet (100 mg total) by mouth daily. 11/21/16   [name], NP  spironolactone (ALDACTONE) 25 MG tablet Take 0.5 tablets (12.5 mg total) by mouth daily. 05/30/16   Esterwood, Amy S, PA-C    ALLERGIES:  Allergies  Allergen Reactions  . Isosorbide Nitrate Other (See Comments)    Nose bleeds    SOCIAL HISTORY:  Social History  Substance Use Topics  . Smoking status: Current Every Day Smoker    Packs/day: 0.25    Years: 36.00    Types: E-cigarettes, Cigarettes  . Smokeless tobacco: Never Used     Comment: uses vapor cigarettes (2016 ); I smoke about 4 cigarettes a day  . Alcohol use No    FAMILY HISTORY: Family History  Problem Relation Age of Onset  . Heart attack Brother 25       MI  . Diabetes Brother   . Heart attack Father   . Diabetes Father   . COPD Father   . COPD Mother   . Heart attack Brother   . Stroke Neg Hx     EXAM: BP (!) 100/56   Pulse 73   Temp 98.6 F (37 C) (Oral)   Resp (!) 31   SpO2 92%  CONSTITUTIONAL: Alert and oriented x3 and responds appropriately to questions. Obese and chronically ill appearing but in no distress, well-nourished. HEAD: Normocephalic EYES: Conjunctivae clear, pupils appear equal, EOMI ENT: normal nose; moist mucous membranes NECK: Supple, no meningismus, no nuchal rigidity, no LAD  CARD: RRR; S1 and S2 appreciated; no murmurs, no clicks, no rubs, no gallops RESP: Normal chest excursion without splinting or tachypnea; breath sounds clear and equal bilaterally; no wheezes, no rhonchi, no rales, no hypoxia or respiratory distress, speaking full sentences ABD/GI: Normal bowel sounds; non-distended; soft, non-tender, no rebound, no guarding, no peritoneal signs, no  hepatosplenomegaly BACK:  The back appears normal and is non-tender to palpation, there is no CVA tenderness EXT: Normal ROM in all joints; non-tender to palpation; no edema; normal capillary refill; no cyanosis, no calf tenderness or swelling    SKIN: Normal color for age and race; warm; no rash NEURO: Moves all extremities equally ; sensation to light touch intact diffusely, cranial nerves II through XII intact, normal speech, normal gait, no asterixis PSYCH: The patient's mood and manner are appropriate. Grooming and personal hygiene are appropriate.  MEDICAL DECISION MAKING: Patient here with his wife who is concerned for altered mental status. Currently patient is oriented 3,  neurologically intact without complaints. She is concerned that he could be dehydrated causing him to have increased ammonia leading to hepatic encephalopathy. He has been taking his lactulose and having 3-4 bowel movements a day. He denies any complaints and has asked repeatedly to go home. Have encouraged him to stay for workup and he reluctantly agrees. No focal neurologic deficits on exam. No infectious symptoms. We'll obtain labs, urine, head CT.  ED PROGRESS: Patient's labs show ammonia level of 105. Head CT shows no acute abnormality. Chest x-ray clear. Labs show chronic elevation of AST and alkaline phosphatase as well as total bilirubin. His abdominal exam remains benign. Given extra dose of lactulose here as well as IV fluids. He is still neurologically intact. Refuses rectal temperature and requests discharge. I feel it is reasonable to let the patient go home and continue lactulose at home and increase oral hydration. We have discussed at length that he is at risk for worsening hepatic encephalopathy given his ammonia level is elevated today. Have recommended he increase his lactulose at home if he is not having at least 4-5 bowel movements a day. I do not feel at this time he needs admission. I do not think this is a  stroke. Chest x-ray and urine showed no sign of infection. He does not appear septic. Currently he has no complaints. Patient's wife is comfortable with this plan. They do have a primary care provider for follow-up.   At this time, I do not feel there is any life-threatening condition present. I have reviewed and discussed all results (EKG, imaging, lab, urine as appropriate) and exam findings with patient/family. I have reviewed nursing notes and appropriate previous records.  I feel the patient is safe to be discharged home without further emergent workup and can continue workup as an outpatient as needed. Discussed usual and customary return precautions. Patient/family verbalize understanding and are comfortable with this plan.  Outpatient follow-up has been provided if needed. All questions have been answered.    EKG Interpretation  Date/Time:  Tuesday January 31 2017 23:08:16 EDT Ventricular Rate:  81 PR Interval:    QRS Duration: 99 QT Interval:  394 QTC Calculation: 458 R Axis:   -3 Text Interpretation:  Sinus rhythm Inferior infarct, old No significant change since last tracing Confirmed by Ward, Cyril Mourning [phone]) on 01/31/2017 11:21:52 PM         This chart was scribed in my presence and reviewed by me personally.    Ward, Delice Bison, DO 02/01/17 [phone]

## 2017-01-31 NOTE — ED Triage Notes (Signed)
Per EMS pt's wife states he is "not acting right".  Cannot quantify these symptoms with EMS.  Vague.  Pt A & O x 3.  Pt states he took 2 nitroglycerin at home. No chest pain at this time. Pt is nauseated.

## 2017-02-01 ENCOUNTER — Emergency Department (HOSPITAL_COMMUNITY): Payer: PPO

## 2017-02-01 DIAGNOSIS — R4182 Altered mental status, unspecified: Secondary | ICD-10-CM | POA: Diagnosis not present

## 2017-02-01 LAB — URINALYSIS, ROUTINE W REFLEX MICROSCOPIC
BACTERIA UA: NONE SEEN
Bilirubin Urine: NEGATIVE
Glucose, UA: NEGATIVE mg/dL
Ketones, ur: NEGATIVE mg/dL
NITRITE: NEGATIVE
PROTEIN: 30 mg/dL — AB
SPECIFIC GRAVITY, URINE: 1.027 (ref 1.005–1.030)
pH: 6 (ref 5.0–8.0)

## 2017-02-01 LAB — COMPREHENSIVE METABOLIC PANEL
ALBUMIN: 2.6 g/dL — AB (ref 3.5–5.0)
ALT: 46 U/L (ref 17–63)
AST: 75 U/L — AB (ref 15–41)
Alkaline Phosphatase: 183 U/L — ABNORMAL HIGH (ref 38–126)
Anion gap: 7 (ref 5–15)
BUN: 7 mg/dL (ref 6–20)
CHLORIDE: 106 mmol/L (ref 101–111)
CO2: 22 mmol/L (ref 22–32)
CREATININE: 0.76 mg/dL (ref 0.61–1.24)
Calcium: 8.6 mg/dL — ABNORMAL LOW (ref 8.9–10.3)
GFR calc non Af Amer: >60 mL/min (ref >60)
Glucose, Bld: 146 mg/dL — ABNORMAL HIGH (ref 65–99)
Potassium: 3.5 mmol/L (ref 3.5–5.1)
SODIUM: 135 mmol/L (ref 135–145)
Total Bilirubin: 3.2 mg/dL — ABNORMAL HIGH (ref 0.3–1.2)
Total Protein: 6.7 g/dL (ref 6.5–8.1)

## 2017-02-01 LAB — AMMONIA: AMMONIA: 105 umol/L — AB (ref 9–35)

## 2017-02-01 LAB — CBG MONITORING, ED: Glucose-Capillary: 156 mg/dL — ABNORMAL HIGH (ref 65–99)

## 2017-02-01 MED ORDER — LACTULOSE 10 GM/15ML PO SOLN
30.0000 g | Freq: Once | ORAL | Status: AC
  Administered 2017-02-01: 30 g via ORAL
  Filled 2017-02-01: qty 45

## 2017-02-01 MED ORDER — SODIUM CHLORIDE 0.9 % IV BOLUS (SEPSIS)
1000.0000 mL | Freq: Once | INTRAVENOUS | Status: AC
  Administered 2017-02-01: 1000 mL via INTRAVENOUS

## 2017-02-01 NOTE — ED Notes (Signed)
Pt refused rectal temp  Repeated oral 98.4

## 2017-02-01 NOTE — ED Notes (Signed)
Patient transported to CT 

## 2017-02-01 NOTE — Discharge Instructions (Signed)
Continue your lactulose. You may take an extra dose every day in order to make sure you are having 4-5 bowel movements a day. Please increase your water intake at home. Please return if confusion worsens.

## 2017-02-06 ENCOUNTER — Other Ambulatory Visit: Payer: Self-pay | Admitting: Internal Medicine

## 2017-02-07 ENCOUNTER — Encounter: Payer: Self-pay | Admitting: Internal Medicine

## 2017-02-07 ENCOUNTER — Ambulatory Visit (INDEPENDENT_AMBULATORY_CARE_PROVIDER_SITE_OTHER): Payer: PPO | Admitting: Internal Medicine

## 2017-02-07 VITALS — BP 116/76 | HR 69 | Temp 97.9°F | Wt 285.0 lb

## 2017-02-07 DIAGNOSIS — K7290 Hepatic failure, unspecified without coma: Secondary | ICD-10-CM | POA: Diagnosis not present

## 2017-02-07 DIAGNOSIS — K7682 Hepatic encephalopathy: Secondary | ICD-10-CM

## 2017-02-07 DIAGNOSIS — R400 Somnolence: Secondary | ICD-10-CM | POA: Diagnosis not present

## 2017-02-07 NOTE — Progress Notes (Signed)
Subjective:    Patient ID: Adam Butler, male    DOB: [birth year], 61 y.o.   MRN: [id number]  HPI  Pt presents to the clinic today for ER followup. He was taken to the ER via EMS 7/24 with c/o AMS concerning for hepatic encephalopathy. CT scan of the brain was negative. Ammonia was 105. He was given IV fluids and an extra dose of Lactulose. His encephalopathy cleared and he was discharged with instructions to follow up with his PCP. Since discharge, he denies confusion. He is taking his Lactulose as prescribed. He is having 4-5 bowel movements daily. He has a history of NASH, follows with GI.  Review of Systems  Past Medical History:  Diagnosis Date  . Arthritis   . Cholelithiasis   . Cirrhosis ([place]) 2011   Cryptogenic, Likely NASH. Family/pt deny EtOH. HCV, HBV, HAV negative. ANA negative. AMA positive. Ascites 12/11  . Coronary artery disease    Inferior MI 12/11; LHC with occluded mid CFX and 80% proximal RCA. EF 55%. He had 3.0 x 28 vision BMS to CFX  . Diastolic CHF, acute ([place])    Echo 12/11 with ef 50-55% and mild LVH. EF 55% by LV0gram in 12/11  . Esophageal varices ([place]) 2011, 2013   no hx acute variceal bleed  . Hepatic encephalopathy ([place]) 2011, 12/2013  . Hyperlipemia   . Myocardial infarction [facility]) ? 2012  . PFO (patent foramen ovale): Per TEE 04/20/2015 04/20/2015  . Portal hypertension ([place])   . Rectal varices   . S/P coronary artery stent placement 06/2010  . SVT (supraventricular tachycardia) ([place])    1/12: appeared to be an ectopic atrial tachycardia. Required DCCV with hemodynamic instability  . Type II diabetes mellitus ([place]) 2011    Current Outpatient Prescriptions  Medication Sig Dispense Refill  . aspirin 81 MG tablet Take 81 mg by mouth daily.     Marland Kitchen atorvastatin (LIPITOR) 10 MG tablet TAKE 1 TABLET BY MOUTH ONCE DAILY 90 tablet 2  . cyanocobalamin 1000 MCG tablet Take 1,000 mcg by mouth daily.    . furosemide (LASIX) 40 MG tablet Take 1 tablet (40  mg total) by mouth every morning. (Patient taking differently: Take 20 mg by mouth every morning. ) 90 tablet 3  . glipiZIDE (GLUCOTROL) 10 MG tablet TAKE ONE TABLET BY MOUTH TWICE DAILY BEFORE  A  MEAL. 180 tablet 0  . lactulose (CONSTULOSE) 10 GM/15ML solution TAKE 45 ML BY MOUTH THREE TIMES DAILY ON SCHEDULE IF NO BOWEL BY 1 PM TAKE ADDITIONAL DOSE 5400 mL 2  . metFORMIN (GLUCOPHAGE) 1000 MG tablet TAKE 1 TABLET BY MOUTH TWICE DAILY 180 tablet 0  . Multiple Vitamin (MULTIVITAMIN) capsule Take 1 capsule by mouth daily.      . nadolol (CORGARD) 40 MG tablet Take 1.5 tablets (60 mg) by mouth once daily 45 tablet 2  . nitroGLYCERIN (NITROSTAT) 0.4 MG SL tablet Place 1 tablet (0.4 mg total) under the tongue every 5 (five) minutes as needed for chest pain (chest pain). 25 tablet 5  . ranitidine (ZANTAC) 150 MG tablet Take 150 mg by mouth daily.    . sitaGLIPtin (JANUVIA) 100 MG tablet Take 1 tablet (100 mg total) by mouth daily. 30 tablet 3  . spironolactone (ALDACTONE) 25 MG tablet Take 0.5 tablets (12.5 mg total) by mouth daily. 30 tablet 6   No current facility-administered medications for this visit.     Allergies  Allergen Reactions  . Isosorbide Nitrate Other (See  Comments)    Nose bleeds    Family History  Problem Relation Age of Onset  . Heart attack Brother 39       MI  . Diabetes Brother   . Heart attack Father   . Diabetes Father   . COPD Father   . COPD Mother   . Heart attack Brother   . Stroke Neg Hx     Social History   Social History  . Marital status: Married    Spouse name: N/A  . Number of children: 3  . Years of education: N/A   Occupational History  . Unemployed Other    Worked in maintenance prior   Social History Main Topics  . Smoking status: Current Every Day Smoker    Packs/day: 0.25    Years: 36.00    Types: E-cigarettes, Cigarettes  . Smokeless tobacco: Never Used     Comment: uses vapor cigarettes (2016 ); I smoke about 4 cigarettes a day    . Alcohol use No  . Drug use: No  . Sexual activity: No   Other Topics Concern  . Not on file   Social History Narrative   Married   Gets regular exercise: walking     Constitutional: Denies fever, malaise, fatigue, headache or abrupt weight changes.  Gastrointestinal: Denies abdominal pain, bloating, constipation, diarrhea or blood in the stool.  Neurological: Denies dizziness, difficulty with memory, difficulty with speech or problems with balance and coordination.   No other specific complaints in a complete review of systems (except as listed in HPI above).     Objective:   Physical Exam   BP 116/76   Pulse 69   Temp 97.9 F (36.6 C) (Oral)   Wt 285 lb (129.3 kg)   SpO2 98%   BMI 45.31 kg/m  Wt Readings from Last 3 Encounters:  02/07/17 285 lb (129.3 kg)  12/19/16 286 lb (129.7 kg)  11/17/16 286 lb (129.7 kg)    General: Appears his stated age, obese in NAD. Abdomen: Soft and nontender. Normal bowel sounds.  Neurological: Alert and oriented.    BMET    Component Value Date/Time   NA 135 01/31/2017 2309   K 3.5 01/31/2017 2309   CL 106 01/31/2017 2309   CO2 22 01/31/2017 2309   GLUCOSE 146 (H) 01/31/2017 2309   BUN 7 01/31/2017 2309   CREATININE 0.76 01/31/2017 2309   CREATININE 0.74 03/15/2011 0827   CALCIUM 8.6 (L) 01/31/2017 2309   GFRNONAA >60 01/31/2017 2309   GFRAA >60 01/31/2017 2309    Lipid Panel     Component Value Date/Time   CHOL 133 11/17/2016 0854   TRIG 133.0 11/17/2016 0854   HDL 39.90 11/17/2016 0854   CHOLHDL 3 11/17/2016 0854   VLDL 26.6 11/17/2016 0854   LDLCALC 67 11/17/2016 0854    CBC    Component Value Date/Time   WBC 12.7 (H) 01/31/2017 2309   RBC 3.82 (L) 01/31/2017 2309   HGB 13.0 01/31/2017 2309   HCT 37.6 (L) 01/31/2017 2309   PLT 65 (L) 01/31/2017 2309   MCV 98.4 01/31/2017 2309   MCH 34.0 01/31/2017 2309   MCHC 34.6 01/31/2017 2309   RDW 14.1 01/31/2017 2309   LYMPHSABS 1.1 11/03/2016 1012   MONOABS  0.7 11/03/2016 1012   EOSABS 0.3 11/03/2016 1012   BASOSABS 0.0 11/03/2016 1012    Hgb A1C Lab Results  Component Value Date   HGBA1C 9.7 (H) 11/17/2016  Assessment & Plan:   ER Follow Up for AMS secondary to Hepatic Encephalopathy:  ER notes, labs and imaging reviewed. Continue Lactulose prescribed No further intervention needed at this time Will monitor  Return precautions discussed [name], NP

## 2017-02-07 NOTE — Patient Instructions (Signed)

## 2017-02-24 ENCOUNTER — Other Ambulatory Visit (INDEPENDENT_AMBULATORY_CARE_PROVIDER_SITE_OTHER): Payer: PPO

## 2017-02-24 DIAGNOSIS — E119 Type 2 diabetes mellitus without complications: Secondary | ICD-10-CM

## 2017-02-24 DIAGNOSIS — Z794 Long term (current) use of insulin: Secondary | ICD-10-CM

## 2017-02-24 LAB — HEMOGLOBIN A1C: HEMOGLOBIN A1C: 9.5 % — AB (ref 4.6–6.5)

## 2017-03-02 ENCOUNTER — Inpatient Hospital Stay (HOSPITAL_COMMUNITY)
Admission: EM | Admit: 2017-03-02 | Discharge: 2017-03-04 | DRG: 871 | Disposition: A | Discharge status: Home / Self Care | Payer: PPO | Attending: Internal Medicine | Admitting: Internal Medicine

## 2017-03-02 ENCOUNTER — Emergency Department (HOSPITAL_COMMUNITY): Payer: PPO

## 2017-03-02 ENCOUNTER — Inpatient Hospital Stay (HOSPITAL_COMMUNITY): Payer: PPO

## 2017-03-02 ENCOUNTER — Other Ambulatory Visit: Payer: Self-pay | Admitting: Internal Medicine

## 2017-03-02 ENCOUNTER — Ambulatory Visit (HOSPITAL_COMMUNITY): Payer: PPO

## 2017-03-02 DIAGNOSIS — F17290 Nicotine dependence, other tobacco product, uncomplicated: Secondary | ICD-10-CM | POA: Diagnosis not present

## 2017-03-02 DIAGNOSIS — Z955 Presence of coronary angioplasty implant and graft: Secondary | ICD-10-CM | POA: Diagnosis not present

## 2017-03-02 DIAGNOSIS — D696 Thrombocytopenia, unspecified: Secondary | ICD-10-CM | POA: Diagnosis present

## 2017-03-02 DIAGNOSIS — R0602 Shortness of breath: Secondary | ICD-10-CM

## 2017-03-02 DIAGNOSIS — Z79899 Other long term (current) drug therapy: Secondary | ICD-10-CM | POA: Diagnosis not present

## 2017-03-02 DIAGNOSIS — E785 Hyperlipidemia, unspecified: Secondary | ICD-10-CM | POA: Diagnosis not present

## 2017-03-02 DIAGNOSIS — Z7982 Long term (current) use of aspirin: Secondary | ICD-10-CM

## 2017-03-02 DIAGNOSIS — Z825 Family history of asthma and other chronic lower respiratory diseases: Secondary | ICD-10-CM | POA: Diagnosis not present

## 2017-03-02 DIAGNOSIS — R509 Fever, unspecified: Secondary | ICD-10-CM | POA: Diagnosis not present

## 2017-03-02 DIAGNOSIS — F17210 Nicotine dependence, cigarettes, uncomplicated: Secondary | ICD-10-CM | POA: Diagnosis not present

## 2017-03-02 DIAGNOSIS — K7469 Other cirrhosis of liver: Secondary | ICD-10-CM | POA: Diagnosis not present

## 2017-03-02 DIAGNOSIS — I5033 Acute on chronic diastolic (congestive) heart failure: Secondary | ICD-10-CM | POA: Diagnosis not present

## 2017-03-02 DIAGNOSIS — G9341 Metabolic encephalopathy: Secondary | ICD-10-CM | POA: Diagnosis not present

## 2017-03-02 DIAGNOSIS — I5032 Chronic diastolic (congestive) heart failure: Secondary | ICD-10-CM | POA: Diagnosis present

## 2017-03-02 DIAGNOSIS — Z6837 Body mass index (BMI) 37.0-37.9, adult: Secondary | ICD-10-CM

## 2017-03-02 DIAGNOSIS — Z833 Family history of diabetes mellitus: Secondary | ICD-10-CM | POA: Diagnosis not present

## 2017-03-02 DIAGNOSIS — K7290 Hepatic failure, unspecified without coma: Secondary | ICD-10-CM | POA: Diagnosis not present

## 2017-03-02 DIAGNOSIS — Z888 Allergy status to other drugs, medicaments and biological substances status: Secondary | ICD-10-CM

## 2017-03-02 DIAGNOSIS — A419 Sepsis, unspecified organism: Secondary | ICD-10-CM

## 2017-03-02 DIAGNOSIS — E119 Type 2 diabetes mellitus without complications: Secondary | ICD-10-CM | POA: Diagnosis not present

## 2017-03-02 DIAGNOSIS — Z8249 Family history of ischemic heart disease and other diseases of the circulatory system: Secondary | ICD-10-CM | POA: Diagnosis not present

## 2017-03-02 DIAGNOSIS — I252 Old myocardial infarction: Secondary | ICD-10-CM

## 2017-03-02 DIAGNOSIS — E876 Hypokalemia: Secondary | ICD-10-CM | POA: Diagnosis present

## 2017-03-02 DIAGNOSIS — Z7984 Long term (current) use of oral hypoglycemic drugs: Secondary | ICD-10-CM

## 2017-03-02 DIAGNOSIS — I2510 Atherosclerotic heart disease of native coronary artery without angina pectoris: Secondary | ICD-10-CM | POA: Diagnosis present

## 2017-03-02 DIAGNOSIS — R6521 Severe sepsis with septic shock: Secondary | ICD-10-CM | POA: Diagnosis present

## 2017-03-02 DIAGNOSIS — E1165 Type 2 diabetes mellitus with hyperglycemia: Secondary | ICD-10-CM

## 2017-03-02 DIAGNOSIS — K7460 Unspecified cirrhosis of liver: Secondary | ICD-10-CM | POA: Diagnosis not present

## 2017-03-02 DIAGNOSIS — K7581 Nonalcoholic steatohepatitis (NASH): Secondary | ICD-10-CM | POA: Diagnosis present

## 2017-03-02 DIAGNOSIS — Z794 Long term (current) use of insulin: Principal | ICD-10-CM

## 2017-03-02 DIAGNOSIS — K766 Portal hypertension: Secondary | ICD-10-CM | POA: Diagnosis not present

## 2017-03-02 DIAGNOSIS — K8020 Calculus of gallbladder without cholecystitis without obstruction: Secondary | ICD-10-CM | POA: Diagnosis not present

## 2017-03-02 DIAGNOSIS — E669 Obesity, unspecified: Secondary | ICD-10-CM | POA: Diagnosis not present

## 2017-03-02 DIAGNOSIS — A401 Sepsis due to streptococcus, group B: Principal | ICD-10-CM | POA: Diagnosis present

## 2017-03-02 DIAGNOSIS — R600 Localized edema: Secondary | ICD-10-CM | POA: Diagnosis not present

## 2017-03-02 DIAGNOSIS — R079 Chest pain, unspecified: Secondary | ICD-10-CM | POA: Diagnosis not present

## 2017-03-02 DIAGNOSIS — E0865 Diabetes mellitus due to underlying condition with hyperglycemia: Secondary | ICD-10-CM | POA: Diagnosis not present

## 2017-03-02 DIAGNOSIS — R6520 Severe sepsis without septic shock: Secondary | ICD-10-CM | POA: Diagnosis not present

## 2017-03-02 DIAGNOSIS — Z823 Family history of stroke: Secondary | ICD-10-CM | POA: Diagnosis not present

## 2017-03-02 DIAGNOSIS — E1159 Type 2 diabetes mellitus with other circulatory complications: Secondary | ICD-10-CM | POA: Diagnosis not present

## 2017-03-02 DIAGNOSIS — K5730 Diverticulosis of large intestine without perforation or abscess without bleeding: Secondary | ICD-10-CM | POA: Diagnosis not present

## 2017-03-02 LAB — I-STAT CG4 LACTIC ACID, ED
LACTIC ACID, VENOUS: 2.08 mmol/L — AB (ref 0.5–1.9)
LACTIC ACID, VENOUS: 3.87 mmol/L — AB (ref 0.5–1.9)
Lactic Acid, Venous: 2.6 mmol/L (ref 0.5–1.9)
Lactic Acid, Venous: 3.88 mmol/L (ref 0.5–1.9)

## 2017-03-02 LAB — CBC WITH DIFFERENTIAL/PLATELET
BASOS ABS: 0 10*3/uL (ref 0.0–0.1)
BASOS PCT: 0 %
EOS PCT: 1 %
Eosinophils Absolute: 0.1 10*3/uL (ref 0.0–0.7)
HCT: 38.1 % — ABNORMAL LOW (ref 39.0–52.0)
Hemoglobin: 13.2 g/dL (ref 13.0–17.0)
Lymphocytes Relative: 7 %
Lymphs Abs: 0.7 10*3/uL (ref 0.7–4.0)
MCH: 34 pg (ref 26.0–34.0)
MCHC: 34.6 g/dL (ref 30.0–36.0)
MCV: 98.2 fL (ref 78.0–100.0)
MONO ABS: 0.2 10*3/uL (ref 0.1–1.0)
Monocytes Relative: 2 %
Neutro Abs: 8.2 10*3/uL — ABNORMAL HIGH (ref 1.7–7.7)
Neutrophils Relative %: 89 %
PLATELETS: 65 10*3/uL — AB (ref 150–400)
RBC: 3.88 MIL/uL — ABNORMAL LOW (ref 4.22–5.81)
RDW: 14 % (ref 11.5–15.5)
WBC: 9.2 10*3/uL (ref 4.0–10.5)

## 2017-03-02 LAB — COMPREHENSIVE METABOLIC PANEL
ALK PHOS: 182 U/L — AB (ref 38–126)
ALT: 47 U/L (ref 17–63)
ANION GAP: 7 (ref 5–15)
AST: 70 U/L — ABNORMAL HIGH (ref 15–41)
Albumin: 2.6 g/dL — ABNORMAL LOW (ref 3.5–5.0)
BILIRUBIN TOTAL: 2.7 mg/dL — AB (ref 0.3–1.2)
BUN: 8 mg/dL (ref 6–20)
CO2: 23 mmol/L (ref 22–32)
CREATININE: 0.82 mg/dL (ref 0.61–1.24)
Calcium: 8.3 mg/dL — ABNORMAL LOW (ref 8.9–10.3)
Chloride: 105 mmol/L (ref 101–111)
GLUCOSE: 124 mg/dL — AB (ref 65–99)
Potassium: 3 mmol/L — ABNORMAL LOW (ref 3.5–5.1)
Sodium: 135 mmol/L (ref 135–145)
TOTAL PROTEIN: 7.1 g/dL (ref 6.5–8.1)

## 2017-03-02 LAB — CBG MONITORING, ED
GLUCOSE-CAPILLARY: 81 mg/dL (ref 65–99)
GLUCOSE-CAPILLARY: 137 mg/dL — AB (ref 65–99)
Glucose-Capillary: 85 mg/dL (ref 65–99)

## 2017-03-02 LAB — BLOOD CULTURE ID PANEL (REFLEXED)
Acinetobacter baumannii: NOT DETECTED
CANDIDA ALBICANS: NOT DETECTED
CANDIDA TROPICALIS: NOT DETECTED
Candida glabrata: NOT DETECTED
Candida krusei: NOT DETECTED
Candida parapsilosis: NOT DETECTED
ENTEROBACTERIACEAE SPECIES: NOT DETECTED
Enterobacter cloacae complex: NOT DETECTED
Enterococcus species: NOT DETECTED
Escherichia coli: NOT DETECTED
HAEMOPHILUS INFLUENZAE: NOT DETECTED
KLEBSIELLA PNEUMONIAE: NOT DETECTED
Klebsiella oxytoca: NOT DETECTED
Listeria monocytogenes: NOT DETECTED
Neisseria meningitidis: NOT DETECTED
PROTEUS SPECIES: NOT DETECTED
PSEUDOMONAS AERUGINOSA: NOT DETECTED
STAPHYLOCOCCUS SPECIES: NOT DETECTED
STREPTOCOCCUS AGALACTIAE: DETECTED — AB
STREPTOCOCCUS PNEUMONIAE: NOT DETECTED
STREPTOCOCCUS SPECIES: DETECTED — AB
Serratia marcescens: NOT DETECTED
Staphylococcus aureus (BCID): NOT DETECTED
Streptococcus pyogenes: NOT DETECTED

## 2017-03-02 LAB — BASIC METABOLIC PANEL
ANION GAP: 8 (ref 5–15)
BUN: 11 mg/dL (ref 6–20)
CALCIUM: 8 mg/dL — AB (ref 8.9–10.3)
CO2: 22 mmol/L (ref 22–32)
CREATININE: 0.96 mg/dL (ref 0.61–1.24)
Chloride: 106 mmol/L (ref 101–111)
Glucose, Bld: 140 mg/dL — ABNORMAL HIGH (ref 65–99)
Potassium: 3.9 mmol/L (ref 3.5–5.1)
SODIUM: 136 mmol/L (ref 135–145)

## 2017-03-02 LAB — I-STAT ARTERIAL BLOOD GAS, ED
Bicarbonate: 21.4 mmol/L (ref 20.0–28.0)
O2 Saturation: 96 %
PCO2 ART: 26.1 mmHg — AB (ref 32.0–48.0)
PH ART: 7.523 — AB (ref 7.350–7.450)
Patient temperature: 98.6
TCO2: 22 mmol/L (ref 0–100)
pO2, Arterial: 70 mmHg — ABNORMAL LOW (ref 83.0–108.0)

## 2017-03-02 LAB — URINALYSIS, ROUTINE W REFLEX MICROSCOPIC
Bacteria, UA: NONE SEEN
Bilirubin Urine: NEGATIVE
GLUCOSE, UA: 150 mg/dL — AB
KETONES UR: NEGATIVE mg/dL
Leukocytes, UA: NEGATIVE
Nitrite: NEGATIVE
PH: 6 (ref 5.0–8.0)
Protein, ur: 30 mg/dL — AB
SPECIFIC GRAVITY, URINE: 1.021 (ref 1.005–1.030)

## 2017-03-02 LAB — PROTIME-INR
INR: 1.52
Prothrombin Time: 18.5 seconds — ABNORMAL HIGH (ref 11.4–15.2)

## 2017-03-02 LAB — GLUCOSE, CAPILLARY: Glucose-Capillary: 122 mg/dL — ABNORMAL HIGH (ref 65–99)

## 2017-03-02 LAB — PROCALCITONIN: Procalcitonin: 0.15 ng/mL

## 2017-03-02 LAB — BRAIN NATRIURETIC PEPTIDE: B NATRIURETIC PEPTIDE 5: 54 pg/mL (ref 0.0–100.0)

## 2017-03-02 LAB — LACTIC ACID, PLASMA: LACTIC ACID, VENOUS: 4 mmol/L — AB (ref 0.5–1.9)

## 2017-03-02 LAB — AMMONIA: AMMONIA: 75 umol/L — AB (ref 9–35)

## 2017-03-02 LAB — LIPASE, BLOOD: Lipase: 74 U/L — ABNORMAL HIGH (ref 11–51)

## 2017-03-02 LAB — HIV ANTIBODY (ROUTINE TESTING W REFLEX): HIV Screen 4th Generation wRfx: NONREACTIVE

## 2017-03-02 MED ORDER — SODIUM CHLORIDE 0.9 % IV BOLUS (SEPSIS)
1000.0000 mL | Freq: Once | INTRAVENOUS | Status: AC
  Administered 2017-03-02: 1000 mL via INTRAVENOUS

## 2017-03-02 MED ORDER — ATORVASTATIN CALCIUM 10 MG PO TABS
10.0000 mg | ORAL_TABLET | Freq: Every day | ORAL | Status: DC
  Administered 2017-03-02 – 2017-03-04 (×3): 10 mg via ORAL
  Filled 2017-03-02 (×3): qty 1

## 2017-03-02 MED ORDER — IBUPROFEN 200 MG PO TABS
600.0000 mg | ORAL_TABLET | Freq: Four times a day (QID) | ORAL | Status: DC | PRN
  Filled 2017-03-02: qty 1

## 2017-03-02 MED ORDER — SODIUM CHLORIDE 0.9 % IV SOLN
INTRAVENOUS | Status: DC
  Administered 2017-03-02 (×2): via INTRAVENOUS

## 2017-03-02 MED ORDER — PIPERACILLIN-TAZOBACTAM 3.375 G IVPB 30 MIN
3.3750 g | Freq: Once | INTRAVENOUS | Status: AC
  Administered 2017-03-02: 3.375 g via INTRAVENOUS
  Filled 2017-03-02: qty 50

## 2017-03-02 MED ORDER — ACETAMINOPHEN 325 MG PO TABS
650.0000 mg | ORAL_TABLET | Freq: Four times a day (QID) | ORAL | Status: DC | PRN
  Administered 2017-03-02: 650 mg via ORAL
  Filled 2017-03-02: qty 2

## 2017-03-02 MED ORDER — MULTIVITAMINS PO CAPS: 1.0000 | ORAL_CAPSULE | Freq: Every day | ORAL | Status: DC

## 2017-03-02 MED ORDER — ASPIRIN 81 MG PO TABS: 81.0000 mg | ORAL_TABLET | Freq: Every day | ORAL | Status: DC

## 2017-03-02 MED ORDER — ALBUTEROL SULFATE (2.5 MG/3ML) 0.083% IN NEBU
2.5000 mg | INHALATION_SOLUTION | Freq: Four times a day (QID) | RESPIRATORY_TRACT | Status: DC | PRN
  Administered 2017-03-02: 2.5 mg via RESPIRATORY_TRACT
  Filled 2017-03-02 (×2): qty 3

## 2017-03-02 MED ORDER — VANCOMYCIN HCL IN DEXTROSE 1-5 GM/200ML-% IV SOLN: 1000.0000 mg | Freq: Once | INTRAVENOUS | Status: DC

## 2017-03-02 MED ORDER — IOPAMIDOL (ISOVUE-300) INJECTION 61%
INTRAVENOUS | Status: AC
  Administered 2017-03-02: 100 mL
  Filled 2017-03-02: qty 100

## 2017-03-02 MED ORDER — ONDANSETRON HCL 4 MG/2ML IJ SOLN: 4.0000 mg | Freq: Four times a day (QID) | INTRAMUSCULAR | Status: DC | PRN

## 2017-03-02 MED ORDER — VANCOMYCIN HCL 10 G IV SOLR
1250.0000 mg | Freq: Two times a day (BID) | INTRAVENOUS | Status: DC
  Filled 2017-03-02: qty 1250

## 2017-03-02 MED ORDER — ONDANSETRON HCL 4 MG PO TABS: 4.0000 mg | ORAL_TABLET | Freq: Four times a day (QID) | ORAL | Status: DC | PRN

## 2017-03-02 MED ORDER — LACTULOSE 10 GM/15ML PO SOLN
30.0000 g | Freq: Three times a day (TID) | ORAL | Status: DC
  Administered 2017-03-02 – 2017-03-03 (×2): 30 g via ORAL
  Filled 2017-03-02 (×7): qty 45

## 2017-03-02 MED ORDER — FUROSEMIDE 10 MG/ML IJ SOLN
40.0000 mg | Freq: Once | INTRAMUSCULAR | Status: AC
  Administered 2017-03-02: 40 mg via INTRAVENOUS
  Filled 2017-03-02: qty 4

## 2017-03-02 MED ORDER — ADULT MULTIVITAMIN W/MINERALS CH
1.0000 | ORAL_TABLET | Freq: Every day | ORAL | Status: DC
  Administered 2017-03-02 – 2017-03-04 (×3): 1 via ORAL
  Filled 2017-03-02 (×3): qty 1

## 2017-03-02 MED ORDER — SODIUM CHLORIDE 0.9 % IV SOLN
2000.0000 mg | Freq: Once | INTRAVENOUS | Status: AC
  Administered 2017-03-02: 2000 mg via INTRAVENOUS
  Filled 2017-03-02: qty 2000

## 2017-03-02 MED ORDER — INSULIN ASPART 100 UNIT/ML ~~LOC~~ SOLN
0.0000 [IU] | Freq: Three times a day (TID) | SUBCUTANEOUS | Status: DC
  Administered 2017-03-03: 5 [IU] via SUBCUTANEOUS
  Administered 2017-03-03: 8 [IU] via SUBCUTANEOUS
  Administered 2017-03-04: 3 [IU] via SUBCUTANEOUS

## 2017-03-02 MED ORDER — NADOLOL 40 MG PO TABS: 60.0000 mg | ORAL_TABLET | Freq: Every day | ORAL | Status: DC

## 2017-03-02 MED ORDER — PIPERACILLIN-TAZOBACTAM 3.375 G IVPB
3.3750 g | Freq: Three times a day (TID) | INTRAVENOUS | Status: DC
  Administered 2017-03-02 – 2017-03-03 (×4): 3.375 g via INTRAVENOUS
  Filled 2017-03-02 (×4): qty 50

## 2017-03-02 MED ORDER — VANCOMYCIN HCL 10 G IV SOLR
1250.0000 mg | Freq: Two times a day (BID) | INTRAVENOUS | Status: DC
  Administered 2017-03-02 – 2017-03-03 (×2): 1250 mg via INTRAVENOUS
  Filled 2017-03-02 (×3): qty 1250

## 2017-03-02 MED ORDER — POTASSIUM CHLORIDE CRYS ER 20 MEQ PO TBCR
40.0000 meq | EXTENDED_RELEASE_TABLET | Freq: Once | ORAL | Status: AC
  Administered 2017-03-02: 40 meq via ORAL
  Filled 2017-03-02: qty 2

## 2017-03-02 MED ORDER — FAMOTIDINE 20 MG PO TABS
20.0000 mg | ORAL_TABLET | Freq: Every day | ORAL | Status: DC
  Administered 2017-03-02 – 2017-03-04 (×3): 20 mg via ORAL
  Filled 2017-03-02 (×3): qty 1

## 2017-03-02 MED ORDER — VITAMIN B-12 1000 MCG PO TABS
1000.0000 ug | ORAL_TABLET | Freq: Every day | ORAL | Status: DC
  Administered 2017-03-02 – 2017-03-04 (×3): 1000 ug via ORAL
  Filled 2017-03-02 (×3): qty 1

## 2017-03-02 NOTE — ED Notes (Signed)
Admitting advised to hold insulin at this time

## 2017-03-02 NOTE — Progress Notes (Signed)
03/02/2017 1700 Received pt to room 4E-01 from ED.  Pt is A&Ox4.  No c/o voiced.  Tele monitor applied and CCMD notified.  Oriented to room, call light and bed.  Call bell in reach.  Family at bedside. [place]

## 2017-03-02 NOTE — ED Notes (Signed)
Admitting aware of bp

## 2017-03-02 NOTE — ED Notes (Signed)
General surgery PA at bedside

## 2017-03-02 NOTE — Progress Notes (Addendum)
Pharmacy Antibiotic Note  Adam Butler is a 61 y.o. male admitted on 03/02/2017 with sepsis.  Pharmacy has been consulted for Vancomycin and Zosyn dosing. Tmax 102.5 prior to ED and AMS. Also tachycardic. Labs pending. Most recent SCr was 0.76. Most recent weight per office visit was 285 lb (129 kg)  Plan: Vancomycin 2g IV x1 now.  Zosyn 3.375g IV x1 now over 30 min as ordered.  Follow-up labs for maintenance dosing.      No data recorded.  No results for input(s): WBC, CREATININE, LATICACIDVEN, VANCOTROUGH, VANCOPEAK, VANCORANDOM, GENTTROUGH, GENTPEAK, GENTRANDOM, TOBRATROUGH, TOBRAPEAK, TOBRARND, AMIKACINPEAK, AMIKACINTROU, AMIKACIN in the last 168 hours.  CrCl cannot be calculated (Patient's most recent lab result is older than the maximum 21 days allowed.).    Allergies  Allergen Reactions  . Isosorbide Nitrate Other (See Comments)    Nose bleeds    Antimicrobials this admission: Vancomycin 8/23 >> Zosyn 8/23 >>  Dose adjustments this admission:   Microbiology results: 8/23 BCx: pending 8/23 UCx: pending  Thank you for allowing pharmacy to be a part of this patient's care.  Sloan Leiter, PharmD, BCPS Clinical Pharmacist After hours, please call [phone] 03/02/2017 2:10 AM   Addendum: SCr 0.82, CrCl >100.  WBC wnl (ANC 8.2) LA 2.08. Fever 101.4.   Plan: Vancomycin 1250 mg IV every 12 hours.  Zosyn 3.375g IV every 8 hours- 4 hr infusion.  Follow-up renal function, clinical status, and culture results.   Sloan Leiter, PharmD, BCPS Clinical Pharmacist After hours, please call [phone] 03/02/2017, 7:40 AM

## 2017-03-02 NOTE — ED Notes (Signed)
Admitting aware of elevated lactic

## 2017-03-02 NOTE — ED Notes (Signed)
Bladder scan patient. 26m found

## 2017-03-02 NOTE — ED Notes (Signed)
Admitting paged about bp

## 2017-03-02 NOTE — ED Provider Notes (Signed)
[facility] DEPT Provider Note   CSN: [id number] Arrival date & time: 03/02/17  0200     History   Chief Complaint Chief Complaint  Patient presents with  . Altered Mental Status  . Fever    HPI Adam Butler is a 61 y.o. male.  Patient brought to the ER by ambulance from home. Patient reportedly went to bed at 10 PM and was in his normal state of health. Wife reports that she woke up and he was moaning and confused. He felt warm to the touch. EMS was called. They have obtained a temperature of 102.6 prior to coming to the ER. Patient has been confused, agitated, answers only to his name. He has not consistently followed commands. He has not had hypotension during transport. Level V Caveat due to acuity.      Past Medical History:  Diagnosis Date  . Arthritis   . Cholelithiasis   . Cirrhosis ([place]) 2011   Cryptogenic, Likely NASH. Family/pt deny EtOH. HCV, HBV, HAV negative. ANA negative. AMA positive. Ascites 12/11  . Coronary artery disease    Inferior MI 12/11; LHC with occluded mid CFX and 80% proximal RCA. EF 55%. He had 3.0 x 28 vision BMS to CFX  . Diastolic CHF, acute ([place])    Echo 12/11 with ef 50-55% and mild LVH. EF 55% by LV0gram in 12/11  . Esophageal varices ([place]) 2011, 2013   no hx acute variceal bleed  . Hepatic encephalopathy ([place]) 2011, 12/2013  . Hyperlipemia   . Myocardial infarction [facility]) ? 2012  . PFO (patent foramen ovale): Per TEE 04/20/2015 04/20/2015  . Portal hypertension ([place])   . Rectal varices   . S/P coronary artery stent placement 06/2010  . SVT (supraventricular tachycardia) ([place])    1/12: appeared to be an ectopic atrial tachycardia. Required DCCV with hemodynamic instability  . Type II diabetes mellitus ([place]) 2011    Patient Active Problem List   Diagnosis Date Noted  . Sepsis ([place]) 03/02/2017  . OA (osteoarthritis) of knee 07/19/2016  . Severe obesity (BMI >= 40) ([place]) 07/19/2016  . Portal vein thrombosis   . Hepatic  encephalopathy ([place]) 12/16/2015  . GERD (gastroesophageal reflux disease) 09/29/2015  . Type 2 diabetes mellitus ([place]) 08/18/2015  . CAD in native artery   . Liver cirrhosis secondary to NASH ([place]) 06/06/2015  . Tobacco abuse 05/04/2015  . PFO (patent foramen ovale): Per TEE 04/20/2015 04/20/2015  . Esophageal varices without bleeding ([place]) 08/16/2010  . HLD (hyperlipidemia) 08/02/2010  . DIASTOLIC HEART FAILURE, CHRONIC 08/02/2010    Past Surgical History:  Procedure Laterality Date  . APPENDECTOMY    . CARDIAC CATHETERIZATION  07/01/2010   BMS to CFX.  Marland Kitchen CATARACT EXTRACTION W/PHACO Left 04/28/2016   Procedure: CATARACT EXTRACTION PHACO AND INTRAOCULAR LENS PLACEMENT (IOC);  Surgeon: Leandrew Koyanagi, MD;  Location: [facility] ORS;  Service: Ophthalmology;  Laterality: Left;  Lot# [id number] H [place]: 05:31.7 AP%: 28.2 CDE: 93.48   . COLONOSCOPY  04/13/2012   Procedure: COLONOSCOPY;  Surgeon: Inda Castle, MD;  Location: [facility] ENDOSCOPY;  Service: Endoscopy;  Laterality: N/A;  . CORONARY ANGIOPLASTY    . CORONARY STENT PLACEMENT  06/30/2010   CFX   Distal        . ESOPHAGEAL BANDING N/A 01/01/2014   Procedure: ESOPHAGEAL BANDING;  Surgeon: Jerene Bears, MD;  Location: [facility] ENDOSCOPY;  Service: Endoscopy;  Laterality: N/A;  . ESOPHAGOGASTRODUODENOSCOPY  04/13/2012   Procedure: ESOPHAGOGASTRODUODENOSCOPY (EGD);  Surgeon: Inda Castle, MD;  Location: [facility] ENDOSCOPY;  Service: Endoscopy;  Laterality: N/A;  . ESOPHAGOGASTRODUODENOSCOPY N/A 01/01/2014   Procedure: ESOPHAGOGASTRODUODENOSCOPY (EGD);  Surgeon: Jerene Bears, MD;  Location[facility] ENDOSCOPY;  Service: Endoscopy;  Laterality: N/A;  . ESOPHAGOGASTRODUODENOSCOPY (EGD) WITH PROPOFOL N/A 07/12/2016   Procedure: ESOPHAGOGASTRODUODENOSCOPY (EGD) WITH PROPOFOL;  Surgeon: Jerene Bears, MD;  Location: [facility] ENDOSCOPY;  Service: Gastroenterology;  Laterality: N/A;  . LEFT HEART CATHETERIZATION WITH CORONARY ANGIOGRAM N/A 10/15/2014   Procedure: LEFT HEART  CATHETERIZATION WITH CORONARY ANGIOGRAM;  Surgeon: Peter M Martinique, MD;  Location: [facility] CATH LAB;  Service: Cardiovascular;  Laterality: N/A;  . orif r leg    . TEE WITHOUT CARDIOVERSION N/A 04/20/2015   Procedure: TRANSESOPHAGEAL ECHOCARDIOGRAM (TEE);  Surgeon: Fay Records, MD;  Location: [facility] ENDOSCOPY;  Service: Cardiovascular;  Laterality: N/A;       Home Medications    Prior to Admission medications   Medication Sig Start Date End Date Taking? Authorizing Provider  aspirin 81 MG tablet Take 81 mg by mouth daily.     [provider]  atorvastatin (LIPITOR) 10 MG tablet TAKE 1 TABLET BY MOUTH ONCE DAILY 02/06/17   [name], NP  cyanocobalamin 1000 MCG tablet Take 1,000 mcg by mouth daily.    [provider]  furosemide (LASIX) 40 MG tablet Take 1 tablet (40 mg total) by mouth every morning. Patient taking differently: Take 20 mg by mouth every morning.  05/30/16   Esterwood, Amy S, PA-C  glipiZIDE (GLUCOTROL) 10 MG tablet TAKE ONE TABLET BY MOUTH TWICE DAILY BEFORE  A  MEAL. 12/19/16   [name], NP  lactulose (CONSTULOSE) 10 GM/15ML solution TAKE 45 ML BY MOUTH THREE TIMES DAILY ON SCHEDULE IF NO BOWEL BY 1 PM TAKE ADDITIONAL DOSE 01/16/17   Pyrtle, Lajuan Lines, MD  metFORMIN (GLUCOPHAGE) 1000 MG tablet TAKE 1 TABLET BY MOUTH TWICE DAILY 12/16/16   [name], NP  Multiple Vitamin (MULTIVITAMIN) capsule Take 1 capsule by mouth daily.      [provider]  nadolol (CORGARD) 40 MG tablet Take 1.5 tablets (60 mg) by mouth once daily 01/16/17   Pyrtle, Lajuan Lines, MD  nitroGLYCERIN (NITROSTAT) 0.4 MG SL tablet Place 1 tablet (0.4 mg total) under the tongue every 5 (five) minutes as needed for chest pain (chest pain). 07/19/16   [name], NP  ranitidine (ZANTAC) 150 MG tablet Take 150 mg by mouth daily.    [provider]  sitaGLIPtin (JANUVIA) 100 MG tablet Take 1 tablet (100 mg total) by mouth daily. 11/21/16   [name], NP  spironolactone  (ALDACTONE) 25 MG tablet Take 0.5 tablets (12.5 mg total) by mouth daily. 05/30/16   Esterwood, Amy S, PA-C    Family History Family History  Problem Relation Age of Onset  . Heart attack Brother 31       MI  . Diabetes Brother   . Heart attack Father   . Diabetes Father   . COPD Father   . COPD Mother   . Heart attack Brother   . Stroke Neg Hx     Social History Social History  Substance Use Topics  . Smoking status: Current Every Day Smoker    Packs/day: 0.25    Years: 36.00    Types: E-cigarettes, Cigarettes  . Smokeless tobacco: Never Used     Comment: uses vapor cigarettes (2016 ); I smoke about 4 cigarettes a day  . Alcohol use No  Allergies   Isosorbide nitrate   Review of Systems Review of Systems  Unable to perform ROS: Acuity of condition     Physical Exam Updated Vital Signs BP (!) 106/54   Pulse 86   Temp (!) 101.4 F (38.6 C) (Rectal)   Resp (!) 33   Ht 6' (1.829 m)   Wt 124.7 kg (275 lb)   SpO2 97%   BMI 37.30 kg/m   Physical Exam  Constitutional: He appears well-developed and well-nourished. He appears lethargic. He appears distressed.  HENT:  Head: Normocephalic and atraumatic.  Right Ear: Hearing normal.  Left Ear: Hearing normal.  Nose: Nose normal.  Mouth/Throat: Oropharynx is clear and moist. Mucous membranes are dry.  Eyes: Pupils are equal, round, and reactive to light. Conjunctivae and EOM are normal.  Neck: Normal range of motion. Neck supple.  Cardiovascular: Regular rhythm, S1 normal and S2 normal.  Tachycardia present.  Exam reveals no gallop and no friction rub.   No murmur heard. Pulmonary/Chest: Effort normal. Tachypnea noted. No respiratory distress. He has rhonchi. He exhibits no tenderness.  Abdominal: Soft. Normal appearance and bowel sounds are normal. There is no hepatosplenomegaly. There is no tenderness. There is no rebound, no guarding, no tenderness at McBurney's point and negative Murphy's sign. No hernia.    Musculoskeletal: Normal range of motion.  Neurological: He appears lethargic. No cranial nerve deficit. He exhibits normal muscle tone. Coordination normal.  Skin: Skin is warm, dry and intact. No rash noted. No cyanosis.  Psychiatric: He is noncommunicative.  Nursing note and vitals reviewed.    ED Treatments / Results  Labs (all labs ordered are listed, but only abnormal results are displayed) Labs Reviewed  COMPREHENSIVE METABOLIC PANEL - Abnormal; Notable for the following:       Result Value   Potassium 3.0 (*)    Glucose, Bld 124 (*)    Calcium 8.3 (*)    Albumin 2.6 (*)    AST 70 (*)    Alkaline Phosphatase 182 (*)    Total Bilirubin 2.7 (*)    All other components within normal limits  CBC WITH DIFFERENTIAL/PLATELET - Abnormal; Notable for the following:    RBC 3.88 (*)    HCT 38.1 (*)    Platelets 65 (*)    Neutro Abs 8.2 (*)    All other components within normal limits  URINALYSIS, ROUTINE W REFLEX MICROSCOPIC - Abnormal; Notable for the following:    Color, Urine AMBER (*)    Glucose, UA 150 (*)    Hgb urine dipstick MODERATE (*)    Protein, ur 30 (*)    Squamous Epithelial / LPF 0-5 (*)    All other components within normal limits  LIPASE, BLOOD - Abnormal; Notable for the following:    Lipase 74 (*)    All other components within normal limits  PROTIME-INR - Abnormal; Notable for the following:    Prothrombin Time 18.5 (*)    All other components within normal limits  AMMONIA - Abnormal; Notable for the following:    Ammonia 75 (*)    All other components within normal limits  I-STAT CG4 LACTIC ACID, ED - Abnormal; Notable for the following:    Lactic Acid, Venous 2.60 (*)    All other components within normal limits  I-STAT ARTERIAL BLOOD GAS, ED - Abnormal; Notable for the following:    pH, Arterial 7.523 (*)    pCO2 arterial 26.1 (*)    pO2, Arterial 70.0 (*)  All other components within normal limits  I-STAT CG4 LACTIC ACID, ED - Abnormal;  Notable for the following:    Lactic Acid, Venous 2.08 (*)    All other components within normal limits  CULTURE, BLOOD (ROUTINE X 2)  CULTURE, BLOOD (ROUTINE X 2)  URINE CULTURE  BLOOD GAS, ARTERIAL  HIV ANTIBODY (ROUTINE TESTING)    EKG  EKG Interpretation  Date/Time:  Thursday March 02 2017 02:06:30 EDT Ventricular Rate:  109 PR Interval:    QRS Duration: 94 QT Interval:  387 QTC Calculation: 519 R Axis:   14 Text Interpretation:  Sinus tachycardia Inferolateral infarct, age indeterminate Prolonged QT interval Baseline wander in lead(s) V5 No significant change since last tracing Confirmed by Orpah Greek [phone]) on 03/02/2017 2:08:17 AM Also confirmed by Orpah Greek [phone]), editor Hattie Perch [phone])  on 03/02/2017 6:45:18 AM       Radiology Dg Chest Port 1 View  Result Date: 03/02/2017 CLINICAL DATA:  Dyspnea and chest pain x8 hours EXAM: PORTABLE CHEST 1 VIEW COMPARISON:  01/31/2017 FINDINGS: Crowding of interstitial lung markings due to low lung volumes. Atelectasis noted at the right lung base. Accentuated cardiac silhouette size likely due to portable technique. The aorta is not well visualized but the arch due to projection. No pneumonic consolidation, pneumothorax nor definite effusion. No acute nor suspicious osseous abnormality. IMPRESSION: Low lung volumes with crowding of interstitial lung markings. Right basilar atelectasis. Electronically Signed   By: Ashley Royalty M.D.   On: 03/02/2017 03:50    Procedures Procedures (including critical care time)  Medications Ordered in ED Medications  atorvastatin (LIPITOR) tablet 10 mg (not administered)  vitamin B-12 (CYANOCOBALAMIN) tablet 1,000 mcg (not administered)  lactulose (CHRONULAC) 10 GM/15ML solution 30 g (not administered)  famotidine (PEPCID) tablet 20 mg (not administered)  multivitamin capsule 1 capsule (not administered)  insulin aspart (novoLOG) injection 0-15 Units (not  administered)  ondansetron (ZOFRAN) tablet 4 mg (not administered)    Or  ondansetron (ZOFRAN) injection 4 mg (not administered)  ibuprofen (ADVIL,MOTRIN) tablet 600 mg (not administered)  piperacillin-tazobactam (ZOSYN) IVPB 3.375 g (0 g Intravenous Stopped 03/02/17 0259)  sodium chloride 0.9 % bolus 1,000 mL (0 mLs Intravenous Stopped 03/02/17 0300)  vancomycin (VANCOCIN) 2,000 mg in sodium chloride 0.9 % 500 mL IVPB (0 mg Intravenous Stopped 03/02/17 0457)  potassium chloride SA (K-DUR,KLOR-CON) CR tablet 40 mEq (40 mEq Oral Given 03/02/17 5427)     Initial Impression / Assessment and Plan / ED Course  I have reviewed the triage vital signs and the nursing notes.  Pertinent labs & imaging results that were available during my care of the patient were reviewed by me and considered in my medical decision making (see chart for details).     Patient presents to the ER for evaluation of high fever. Patient went to bed feeling well, woke up several hours with confusion and fever. At arrival to the ER patient was lethargic, acutely confused and obtunded. This has, however, slowly cleared here in the ER he is now awake, alert and oriented. I suspect this was significant delirium secondary to his fever. I have not, however, identified a source for his fever. He does have chronic liver disease. Ammonia is elevated at 75, perhaps accounting for some of his mental status changes. He has not, however, have abdominal pain, tenderness. No sign of spontaneous bacterial peritonitis at this time. Patient did have increased respiratory rate when he was febrile, this has slowly improved. Chest x-ray  performed does not show obvious pneumonia.  Patient was treated with broad-spectrum antibiotics at arrival, as he was suspected of having sepsis.   CRITICAL CARE Performed by: Orpah Greek   Total critical care time: 30 minutes  Critical care time was exclusive of separately billable procedures and  treating other patients.  Critical care was necessary to treat or prevent imminent or life-threatening deterioration.  Critical care was time spent personally by me on the following activities: development of treatment plan with patient and/or surrogate as well as nursing, discussions with consultants, evaluation of patient's response to treatment, examination of patient, obtaining history from patient or surrogate, ordering and performing treatments and interventions, ordering and review of laboratory studies, ordering and review of radiographic studies, pulse oximetry and re-evaluation of patient's condition.   Final Clinical Impressions(s) / ED Diagnoses   Final diagnoses:  Sepsis, due to unspecified organism [facility])    New Prescriptions New Prescriptions   No medications on file     Orpah Greek, MD 03/02/17 [phone]

## 2017-03-02 NOTE — Progress Notes (Signed)
PHARMACY - PHYSICIAN COMMUNICATION CRITICAL VALUE ALERT - BLOOD CULTURE IDENTIFICATION (BCID)  Results for orders placed or performed during the hospital encounter of 03/02/17  Blood Culture ID Panel (Reflexed) (Collected: 03/02/2017  3:00 AM)  Result Value Ref Range   Enterococcus species NOT DETECTED NOT DETECTED   Listeria monocytogenes NOT DETECTED NOT DETECTED   Staphylococcus species NOT DETECTED NOT DETECTED   Staphylococcus aureus NOT DETECTED NOT DETECTED   Streptococcus species DETECTED (A) NOT DETECTED   Streptococcus agalactiae DETECTED (A) NOT DETECTED   Streptococcus pneumoniae NOT DETECTED NOT DETECTED   Streptococcus pyogenes NOT DETECTED NOT DETECTED   Acinetobacter baumannii NOT DETECTED NOT DETECTED   Enterobacteriaceae species NOT DETECTED NOT DETECTED   Enterobacter cloacae complex NOT DETECTED NOT DETECTED   Escherichia coli NOT DETECTED NOT DETECTED   Klebsiella oxytoca NOT DETECTED NOT DETECTED   Klebsiella pneumoniae NOT DETECTED NOT DETECTED   Proteus species NOT DETECTED NOT DETECTED   Serratia marcescens NOT DETECTED NOT DETECTED   Haemophilus influenzae NOT DETECTED NOT DETECTED   Neisseria meningitidis NOT DETECTED NOT DETECTED   Pseudomonas aeruginosa NOT DETECTED NOT DETECTED   Candida albicans NOT DETECTED NOT DETECTED   Candida glabrata NOT DETECTED NOT DETECTED   Candida krusei NOT DETECTED NOT DETECTED   Candida parapsilosis NOT DETECTED NOT DETECTED   Candida tropicalis NOT DETECTED NOT DETECTED    Name of physician (or Provider) Contacted:  Dr. Clementeen Graham  Changes to prescribed antibiotics required: continue vanc and zosyn for now.  Annakate Soulier D. Mina Marble, PharmD, BCPS Pager:  [phone] 03/02/2017, 5:39 PM

## 2017-03-02 NOTE — ED Notes (Signed)
Confirmed discontinuation of ibuprofen and change to tylenol with Admitting MD

## 2017-03-02 NOTE — Progress Notes (Signed)
PROGRESS NOTE                                                                                                                                                                                                             Patient Demographics:    Adam Butler, is a 61 y.o. male, DOB - Dec 09, 1955, FFM:[id number]  Admit date - 03/02/2017   Admitting Physician No admitting provider for patient encounter.  Outpatient Primary MD for the patient is Baity, Coralie Keens, NP  LOS - 0  Outpatient Specialists: Dr Hilarie Fredrickson  Chief Complaint  Patient presents with  . Altered Mental Status  . Fever       Brief Narrative   61 year old male with history of NASH cirrhosis, hepatic encephalopathy, esophageal varices, type 2 diabetes mellitus who was brought to the ED by his wife with acute onset of fevers with chills and confusion. EMS was called who found patient to have a temperature of 102.5F. In the ED patient had severe sepsis with temperature of 104.87F, respiratory rate of 40, tachycardic and lactic acid of 2.6. WBC, chemistry and LFTs were normal. Chest x-ray unremarkable. UA negative for infection. Sepsis pathway was initiated and patient given 1 L IV normal saline bolus and empiric vancomycin and Zosyn after blood cultures sent. During my evaluation in the morning patient was alert and oriented but had increased work of breathing. Had further drop in systolic blood pressure to the 80s and received another liter of normal saline bolus. Patient reported mild headache but no blurred vision, nausea, vomiting, chest pain, shortness of breath, abdominal pain, increased abdominal distention, diarrhea, dysuria, joint pains or muscle aches. No recent sick contact or travel. No new medications. Of note, patient was hospitalized in August 2017 with sepsis and Klebsiella bacteremia suspected to be GI source and was discharged on ciprofloxacin (total 10  day course). Hospitalist consulted and patient admitted to stepdown unit.    Subjective:   Patient will oriented during my evaluation this morning. Reports some shortness of breath and is tachypneic.   Assessment  & Plan :    Principal Problem:   Severe Sepsis ([place]) No clear source at present. Received 2 L IV normal saline bolus in the ED and seems to be falling overloaded. Stopped further IV fluids. Blood pressure improved with fluid bolus. Empiric IV vancomycin and Zosyn.  Trend lactic acid. Check pro calcitonin. Follow blood culture, urine culture. I will obtain abdominal ultrasound rule out for ascites and if so he will need paracentesis to rule out SBP. Step down monitoring.  Ultrasound abdomen shows gallstones and mildly thickened and edematous gallbladder concerning for possible acute cholecystitis. Mildly elevated total bilirubin, ALT and alkaline phosphatase (seems unchanged from prior labs). Given unclear source of sepsis I will obtain a HIDA scan.  Active Problems: Acute on Chronic diastolic CHF Possibly triggered by fluid bolus received in the ED. Patient tachypneic and has mild bibasilar crackles on my exam. Start further IV fluids and order when necessary albuterol neb and her dose of IV Lasix. Since blood pressure has been soft and would not resume his home Lasix for now and treat him with when necessary Lasix. Hold Aldactone.  Acute metabolic encephalopathy Secondary to sepsis. Currently resolved. Head CT on admission unremarkable.     Liver cirrhosis secondary to NASH [facility]) Follows with Dr. Hilarie Fredrickson. Has chronic thrombocytopenia. Also has history of hepatic encephalopathy and esophageal varices. Continue home dose scheduled lactulose. Ultrasound abdomen negative for ascites.    Type 2 diabetes mellitus (HCC) Monitor on sliding scale coverage. Holding glipizide on Januvia and metformin.  Coronary artery disease with history of inferior MI Continue aspirin and  statin.   Code Status : Full code  Family Communication  : Wife at bedside  Disposition Plan  : Admit to stepdown  Barriers For Discharge : Active symptoms  Consults  :  None  Procedures  :  Ultrasound abdomen CT head  DVT Prophylaxis  : SCDs  Lab Results  Component Value Date   PLT 65 (L) 03/02/2017    Antibiotics  :    Anti-infectives    Start     Dose/Rate Route Frequency Ordered Stop   03/02/17 1500  vancomycin (VANCOCIN) 1,250 mg in sodium chloride 0.9 % 250 mL IVPB     1,250 mg 166.7 mL/hr over 90 Minutes Intravenous Every 12 hours 03/02/17 1016     03/02/17 1000  vancomycin (VANCOCIN) 1,250 mg in sodium chloride 0.9 % 250 mL IVPB  Status:  Discontinued     1,250 mg 166.7 mL/hr over 90 Minutes Intravenous Every 12 hours 03/02/17 0742 03/02/17 1016   03/02/17 1000  piperacillin-tazobactam (ZOSYN) IVPB 3.375 g     3.375 g 12.5 mL/hr over 240 Minutes Intravenous Every 8 hours 03/02/17 0742     03/02/17 0230  vancomycin (VANCOCIN) 2,000 mg in sodium chloride 0.9 % 500 mL IVPB     2,000 mg 250 mL/hr over 120 Minutes Intravenous  Once 03/02/17 0215 03/02/17 0457   03/02/17 0215  piperacillin-tazobactam (ZOSYN) IVPB 3.375 g     3.375 g 100 mL/hr over 30 Minutes Intravenous  Once 03/02/17 0206 03/02/17 0259   03/02/17 0215  vancomycin (VANCOCIN) IVPB 1000 mg/200 mL premix  Status:  Discontinued     1,000 mg 200 mL/hr over 60 Minutes Intravenous  Once 03/02/17 0206 03/02/17 0215        Objective:   Vitals:   03/02/17 0945 03/02/17 0948 03/02/17 1000 03/02/17 1006  BP: (!) 119/49  122/61   Pulse: 89  86   Resp: (!) 29  (!) 30   Temp:  98.1 F (36.7 C)  100.2 F (37.9 C)  TempSrc:  Oral  Rectal  SpO2: 97%  98%   Weight:      Height:        Wt Readings from Last 3 Encounters:  03/02/17 124.7 kg (275 lb)  02/07/17 129.3 kg (285 lb)  12/19/16 129.7 kg (286 lb)     Intake/Output Summary (Last 24 hours) at 03/02/17 1055 Last data filed at 03/02/17 0949   Gross per 24 hour  Intake             2550 ml  Output              150 ml  Net             2400 ml     Physical Exam  Gen: not in distress HEENT: no pallor, icterus,  moist mucosa, supple neck Chest: Fine bibasilar crackles CVS: N S1&S2, no murmurs, rubs or gallop GI: soft, mild distention, nontender, bowel sounds present Musculoskeletal: warm, trace edema CNS: AAOX3, or tremors    Data Review:    CBC  Recent Labs Lab 03/02/17 0307  WBC 9.2  HGB 13.2  HCT 38.1*  PLT 65*  MCV 98.2  MCH 34.0  MCHC 34.6  RDW 14.0  LYMPHSABS 0.7  MONOABS 0.2  EOSABS 0.1  BASOSABS 0.0    Chemistries   Recent Labs Lab 03/02/17 0307  NA 135  K 3.0*  CL 105  CO2 23  GLUCOSE 124*  BUN 8  CREATININE 0.82  CALCIUM 8.3*  AST 70*  ALT 47  ALKPHOS 182*  BILITOT 2.7*   ------------------------------------------------------------------------------------------------------------------ No results for input(s): CHOL, HDL, LDLCALC, TRIG, CHOLHDL, LDLDIRECT in the last 72 hours.  Lab Results  Component Value Date   HGBA1C 9.5 (H) 02/24/2017   ------------------------------------------------------------------------------------------------------------------ No results for input(s): TSH, T4TOTAL, T3FREE, THYROIDAB in the last 72 hours.  Invalid input(s): FREET3 ------------------------------------------------------------------------------------------------------------------ No results for input(s): VITAMINB12, FOLATE, FERRITIN, TIBC, IRON, RETICCTPCT in the last 72 hours.  Coagulation profile  Recent Labs Lab 03/02/17 0307  INR 1.52    No results for input(s): DDIMER in the last 72 hours.  Cardiac Enzymes No results for input(s): CKMB, TROPONINI, MYOGLOBIN in the last 168 hours.  Invalid input(s): CK ------------------------------------------------------------------------------------------------------------------    Component Value Date/Time   BNP 35.3 06/27/2015 0520    BNP 27.7 01/10/2011 0845    Inpatient Medications  Scheduled Meds: . atorvastatin  10 mg Oral Daily  . famotidine  20 mg Oral Daily  . furosemide  40 mg Intravenous Once  . insulin aspart  0-15 Units Subcutaneous TID WC  . lactulose  30 g Oral TID  . multivitamin with minerals  1 tablet Oral Daily  . cyanocobalamin  1,000 mcg Oral Daily   Continuous Infusions: . piperacillin-tazobactam (ZOSYN)  IV    . vancomycin     PRN Meds:.albuterol, ibuprofen, ondansetron **OR** ondansetron (ZOFRAN) IV  Micro Results Recent Results (from the past 240 hour(s))  Blood Culture (routine x 2)     Status: None (Preliminary result)   Collection Time: 03/02/17  3:00 AM  Result Value Ref Range Status   Specimen Description BLOOD RIGHT WRIST  Final   Special Requests IN PEDIATRIC BOTTLE Blood Culture adequate volume  Final   Culture PENDING  Incomplete   Report Status PENDING  Incomplete    Radiology Reports Dg Chest 2 View  Result Date: 01/31/2017 CLINICAL DATA:  Chest pain and dyspnea tonight EXAM: CHEST  2 VIEW COMPARISON:  02/21/2016 FINDINGS: The lungs are clear. The pulmonary vasculature is normal. Heart size is normal. Hilar and mediastinal contours are unremarkable. There is no pleural effusion. IMPRESSION: No active cardiopulmonary disease. Electronically Signed   By: Andreas Newport  M.D.   On: 01/31/2017 23:51   Ct Head Wo Contrast  Result Date: 02/01/2017 CLINICAL DATA:  Altered mental status. EXAM: CT HEAD WITHOUT CONTRAST TECHNIQUE: Contiguous axial images were obtained from the base of the skull through the vertex without intravenous contrast. COMPARISON:  09/26/2015 FINDINGS: Brain: Stable chronic small vessel ischemia. Again seen encephalomalacia in the right occipital lobe, likely remote infarct. No intracranial hemorrhage, mass effect, or midline shift. No hydrocephalus. The basilar cisterns are patent. No evidence of territorial infarct. No extra-axial or intracranial fluid  collection. Vascular: Atherosclerosis of skullbase vasculature without hyperdense vessel or abnormal calcification. Skull: No skull fracture.  No focal lesion. Sinuses/Orbits: Mild mucosal thickening of left maxillary sinus. The mastoid air cells are clear. The visualized orbits are unremarkable. Other: None. IMPRESSION: 1.  No acute intracranial abnormality. 2. Stable atrophy and chronic small vessel ischemia. Unchanged small encephalomalacia in the right occipital lobe. Electronically Signed   By: Jeb Levering M.D.   On: 02/01/2017 00:57   Dg Chest Port 1 View  Result Date: 03/02/2017 CLINICAL DATA:  Dyspnea and chest pain x8 hours EXAM: PORTABLE CHEST 1 VIEW COMPARISON:  01/31/2017 FINDINGS: Crowding of interstitial lung markings due to low lung volumes. Atelectasis noted at the right lung base. Accentuated cardiac silhouette size likely due to portable technique. The aorta is not well visualized but the arch due to projection. No pneumonic consolidation, pneumothorax nor definite effusion. No acute nor suspicious osseous abnormality. IMPRESSION: Low lung volumes with crowding of interstitial lung markings. Right basilar atelectasis. Electronically Signed   By: Ashley Royalty M.D.   On: 03/02/2017 03:50    Time Spent in minutes  25   Louellen Molder M.D on 03/02/2017 at 10:55 AM  Between 7am to 7pm - Pager - [phone]  After 7pm go to [email or web address] - password [facility]  [facility] -  Office  [phone]

## 2017-03-02 NOTE — ED Notes (Signed)
Admitting aware of tachypnea

## 2017-03-02 NOTE — Progress Notes (Signed)
Patient persistently tachypnic during the day. Mental status clear. Nonspecific abdominal tenderness on exam. CCS consult appreciated. CT abd and pelvis with contrast without acute abdominal findings. Pending HIDA scan.  continue fluid resuscitation. Received 4 L bolus so far . persistently elevated lactic acid could be due to ongoing sepsis and or decompensated liver disease. Will not track further.  Repeat BMET reassuring.

## 2017-03-02 NOTE — Consult Note (Signed)
Reason for Consult: sepsis with possible cholecystitis/cirrhosis Referring Physician: Cathlean Sauer PCP:  [name], NP  GI:  Dr. Kandis Mannan  Adam Butler is an 61 y.o. male.  HPI: 61 Y/O with NASH cirrhosis/portal hypertension, hx of esophageal varices, hepatic encephalopathy, followed by Dr. Raquel James. Presents to the ED this AM with confusion, tmep 102.6 at home, and agitated.  Brought to ED via ambulance. Work up in the ED shows an admit rectal temp of 104.2, tachycardic, but good BP, 0200 hrs.  Temp down in the ED, but also dropping BP down to 72/44 by 7AM.  Last temp at 10 AM 200.2.  BP better with fluids.  PH -7.52/PCO2 -26/PO2 - 70/HCO3 21.  K+ 3.0 LFT's all up some, lipase 74, WBC 9.2/H/H 13/38, platelets 65, Procalcitonin 0.15, some Hgb in urine.  Lactate 2.08 up to 4.0.  CXR:  Low lung volumes, right base atelectasis US shows:  Gallstones with mildly thickened and slightly edematous gallbladder wall. This appearance is concerning for potential acute Cholecystitis.  Liver has a nodular contour consistent with hepatic cirrhosis. Liver echogenicity is increased, a finding that may be seen with hepatic steatosis and/or parenchymal liver disease. While no focal liver lesions are evident on this study.  We are ask to see.  Past Medical History:  Diagnosis Date  . Arthritis   . Cholelithiasis   . Cirrhosis ([place]) 2011   Cryptogenic, Likely NASH. Family/pt deny EtOH. HCV, HBV, HAV negative. ANA negative. AMA positive. Ascites 12/11  . Coronary artery disease    Inferior MI 12/11; LHC with occluded mid CFX and 80% proximal RCA. EF 55%. He had 3.0 x 28 vision BMS to CFX  . Diastolic CHF, acute ([place])    Echo 12/11 with ef 50-55% and mild LVH. EF 55% by LV0gram in 12/11  . Esophageal varices ([place]) 2011, 2013   no hx acute variceal bleed  . Hepatic encephalopathy ([place]) 2011, 12/2013  . Hyperlipemia   . Myocardial infarction [facility]) ? 2012  . PFO (patent foramen ovale): Per TEE 04/20/2015  04/20/2015  . Portal hypertension ([place])   . Rectal varices   . S/P coronary artery stent placement 06/2010  . SVT (supraventricular tachycardia) ([place])    1/12: appeared to be an ectopic atrial tachycardia. Required DCCV with hemodynamic instability  . Type II diabetes mellitus ([place]) 2011    Past Surgical History:  Procedure Laterality Date  . APPENDECTOMY    . CARDIAC CATHETERIZATION  07/01/2010   BMS to CFX.  Marland Kitchen CATARACT EXTRACTION W/PHACO Left 04/28/2016   Procedure: CATARACT EXTRACTION PHACO AND INTRAOCULAR LENS PLACEMENT (IOC);  Surgeon: Leandrew Koyanagi, MD;  Location: [facility] ORS;  Service: Ophthalmology;  Laterality: Left;  Lot# [id number] H [place]: 05:31.7 AP%: 28.2 CDE: 93.48   . COLONOSCOPY  04/13/2012   Procedure: COLONOSCOPY;  Surgeon: Inda Castle, MD;  Location: [facility] ENDOSCOPY;  Service: Endoscopy;  Laterality: N/A;  . CORONARY ANGIOPLASTY    . CORONARY STENT PLACEMENT  06/30/2010   CFX   Distal        . ESOPHAGEAL BANDING N/A 01/01/2014   Procedure: ESOPHAGEAL BANDING;  Surgeon: Jerene Bears, MD;  Location: [facility] ENDOSCOPY;  Service: Endoscopy;  Laterality: N/A;  . ESOPHAGOGASTRODUODENOSCOPY  04/13/2012   Procedure: ESOPHAGOGASTRODUODENOSCOPY (EGD);  Surgeon: Inda Castle, MD;  Location: [facility];  Service: Endoscopy;  Laterality: N/A;  . ESOPHAGOGASTRODUODENOSCOPY N/A 01/01/2014   Procedure: ESOPHAGOGASTRODUODENOSCOPY (EGD);  Surgeon: Jerene Bears, MD;  Location: [facility] ENDOSCOPY;  Service: Endoscopy;  Laterality: N/A;  . ESOPHAGOGASTRODUODENOSCOPY (EGD) WITH PROPOFOL N/A 07/12/2016   Procedure: ESOPHAGOGASTRODUODENOSCOPY (EGD) WITH PROPOFOL;  Surgeon: Jerene Bears, MD;  Location: [facility] ENDOSCOPY;  Service: Gastroenterology;  Laterality: N/A;  . LEFT HEART CATHETERIZATION WITH CORONARY ANGIOGRAM N/A 10/15/2014   Procedure: LEFT HEART CATHETERIZATION WITH CORONARY ANGIOGRAM;  Surgeon: Peter M Martinique, MD;  Location: [facility] CATH LAB;  Service: Cardiovascular;  Laterality: N/A;  . orif r leg     . TEE WITHOUT CARDIOVERSION N/A 04/20/2015   Procedure: TRANSESOPHAGEAL ECHOCARDIOGRAM (TEE);  Surgeon: Fay Records, MD;  Location: [facility] ENDOSCOPY;  Service: Cardiovascular;  Laterality: N/A;    Family History  Problem Relation Age of Onset  . Heart attack Brother 73       MI  . Diabetes Brother   . Heart attack Father   . Diabetes Father   . COPD Father   . COPD Mother   . Heart attack Brother   . Stroke Neg Hx     Social History:  reports that he has been smoking E-cigarettes and Cigarettes.  He has a 9.00 pack-year smoking history. He has never used smokeless tobacco. He reports that he does not drink alcohol or use drugs. 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 Isosorbide Nitrate Other (See Comments)    Nose bleeds    Medications:  Prior to Admission:  (Not in a hospital admission) Scheduled: . atorvastatin  10 mg Oral Daily  . famotidine  20 mg Oral Daily  . insulin aspart  0-15 Units Subcutaneous TID WC  . lactulose  30 g Oral TID  . multivitamin with minerals  1 tablet Oral Daily  . cyanocobalamin  1,000 mcg Oral Daily   Continuous: . piperacillin-tazobactam (ZOSYN)  IV 3.375 g (03/02/17 1150)  . sodium chloride 1,000 mL (03/02/17 1449)  . vancomycin     QPY:[id number], albuterol, ondansetron **OR** ondansetron (ZOFRAN) IV Anti-infectives    Start     Dose/Rate Route Frequency Ordered Stop   03/02/17 1500  vancomycin (VANCOCIN) 1,250 mg in sodium chloride 0.9 % 250 mL IVPB     1,250 mg 166.7 mL/hr over 90 Minutes Intravenous Every 12 hours 03/02/17 1016     03/02/17 1000  vancomycin (VANCOCIN) 1,250 mg in sodium chloride 0.9 % 250 mL IVPB  Status:  Discontinued     1,250 mg 166.7 mL/hr over 90 Minutes Intravenous Every 12 hours 03/02/17 0742 03/02/17 1016   03/02/17 1000   piperacillin-tazobactam (ZOSYN) IVPB 3.375 g     3.375 g 12.5 mL/hr over 240 Minutes Intravenous Every 8 hours 03/02/17 0742     03/02/17 0230  vancomycin (VANCOCIN) 2,000 mg in sodium chloride 0.9 % 500 mL IVPB     2,000 mg 250 mL/hr over 120 Minutes Intravenous  Once 03/02/17 0215 03/02/17 0457   03/02/17 0215  piperacillin-tazobactam (ZOSYN) IVPB 3.375 g     3.375 g 100 mL/hr over 30 Minutes Intravenous  Once 03/02/17 0206 03/02/17 0259   03/02/17 0215  vancomycin (VANCOCIN) IVPB 1000 mg/200 mL premix  Status:  Discontinued     1,000 mg 200 mL/hr over 60 Minutes Intravenous  Once 03/02/17 0206 03/02/17 0215      Results for orders placed or performed during the hospital encounter of 03/02/17 (from  the past 48 hour(s))  I-Stat arterial blood gas, ED     Status: Abnormal   Collection Time: 03/02/17  2:28 AM  Result Value Ref Range   pH, Arterial 7.523 (H) 7.350 - 7.450   pCO2 arterial 26.1 (L) 32.0 - 48.0 mmHg   pO2, Arterial 70.0 (L) 83.0 - 108.0 mmHg   Bicarbonate 21.4 20.0 - 28.0 mmol/L   TCO2 22 0 - 100 mmol/L   O2 Saturation 96.0 %   Patient temperature 98.6 F    Collection site RADIAL, ALLEN'S TEST ACCEPTABLE    Drawn by RT    Sample type ARTERIAL   I-Stat CG4 Lactic Acid, ED  (not at  [facility])     Status: Abnormal   Collection Time: 03/02/17  2:47 AM  Result Value Ref Range   Lactic Acid, Venous 2.60 (HH) 0.5 - 1.9 mmol/L   Comment NOTIFIED PHYSICIAN   Blood Culture (routine x 2)     Status: None (Preliminary result)   Collection Time: 03/02/17  3:00 AM  Result Value Ref Range   Specimen Description BLOOD RIGHT WRIST    Special Requests IN PEDIATRIC BOTTLE Blood Culture adequate volume    Culture PENDING    Report Status PENDING   Comprehensive metabolic panel     Status: Abnormal   Collection Time: 03/02/17  3:07 AM  Result Value Ref Range   Sodium 135 135 - 145 mmol/L   Potassium 3.0 (L) 3.5 - 5.1 mmol/L   Chloride 105 101 - 111 mmol/L   CO2 23 22 - 32 mmol/L    Glucose, Bld 124 (H) 65 - 99 mg/dL   BUN 8 6 - 20 mg/dL   Creatinine, Ser 0.82 0.61 - 1.24 mg/dL   Calcium 8.3 (L) 8.9 - 10.3 mg/dL   Total Protein 7.1 6.5 - 8.1 g/dL   Albumin 2.6 (L) 3.5 - 5.0 g/dL   AST 70 (H) 15 - 41 U/L   ALT 47 17 - 63 U/L   Alkaline Phosphatase 182 (H) 38 - 126 U/L   Total Bilirubin 2.7 (H) 0.3 - 1.2 mg/dL   GFR calc non Af Amer >60 >60 mL/min   GFR calc Af Amer >60 >60 mL/min    Comment: (NOTE) The eGFR has been calculated using the CKD EPI equation. This calculation has not been validated in all clinical situations. eGFR's persistently <60 mL/min signify possible Chronic Kidney Disease.    Anion gap 7 5 - 15  CBC WITH DIFFERENTIAL     Status: Abnormal   Collection Time: 03/02/17  3:07 AM  Result Value Ref Range   WBC 9.2 4.0 - 10.5 K/uL   RBC 3.88 (L) 4.22 - 5.81 MIL/uL   Hemoglobin 13.2 13.0 - 17.0 g/dL   HCT 38.1 (L) 39.0 - 52.0 %   MCV 98.2 78.0 - 100.0 fL   MCH 34.0 26.0 - 34.0 pg   MCHC 34.6 30.0 - 36.0 g/dL   RDW 14.0 11.5 - 15.5 %   Platelets 65 (L) 150 - 400 K/uL    Comment: PLATELET COUNT CONFIRMED BY SMEAR   Neutrophils Relative % 89 %   Neutro Abs 8.2 (H) 1.7 - 7.7 K/uL   Lymphocytes Relative 7 %   Lymphs Abs 0.7 0.7 - 4.0 K/uL   Monocytes Relative 2 %   Monocytes Absolute 0.2 0.1 - 1.0 K/uL   Eosinophils Relative 1 %   Eosinophils Absolute 0.1 0.0 - 0.7 K/uL   Basophils Relative 0 %  Basophils Absolute 0.0 0.0 - 0.1 K/uL  Lipase, blood     Status: Abnormal   Collection Time: 03/02/17  3:07 AM  Result Value Ref Range   Lipase 74 (H) 11 - 51 U/L  Protime-INR     Status: Abnormal   Collection Time: 03/02/17  3:07 AM  Result Value Ref Range   Prothrombin Time 18.5 (H) 11.4 - 15.2 seconds   INR 1.52   Urinalysis, Routine w reflex microscopic     Status: Abnormal   Collection Time: 03/02/17  4:26 AM  Result Value Ref Range   Color, Urine AMBER (A) YELLOW    Comment: BIOCHEMICALS MAY BE AFFECTED BY COLOR   APPearance CLEAR CLEAR    Specific Gravity, Urine 1.021 1.005 - 1.030   pH 6.0 5.0 - 8.0   Glucose, UA 150 (A) NEGATIVE mg/dL   Hgb urine dipstick MODERATE (A) NEGATIVE   Bilirubin Urine NEGATIVE NEGATIVE   Ketones, ur NEGATIVE NEGATIVE mg/dL   Protein, ur 30 (A) NEGATIVE mg/dL   Nitrite NEGATIVE NEGATIVE   Leukocytes, UA NEGATIVE NEGATIVE   RBC / HPF 6-30 0 - 5 RBC/hpf   WBC, UA 0-5 0 - 5 WBC/hpf   Bacteria, UA NONE SEEN NONE SEEN   Squamous Epithelial / LPF 0-5 (A) NONE SEEN   Mucus PRESENT   I-Stat CG4 Lactic Acid, ED  (not at  [facility])     Status: Abnormal   Collection Time: 03/02/17  5:00 AM  Result Value Ref Range   Lactic Acid, Venous 2.08 (HH) 0.5 - 1.9 mmol/L   Comment NOTIFIED PHYSICIAN   Ammonia     Status: Abnormal   Collection Time: 03/02/17  5:25 AM  Result Value Ref Range   Ammonia 75 (H) 9 - 35 umol/L  HIV antibody (Routine Testing)     Status: None   Collection Time: 03/02/17  7:16 AM  Result Value Ref Range   HIV Screen 4th Generation wRfx Non Reactive Non Reactive    Comment: (NOTE) Performed At: [facility] [place], [place] [id number] Lindon Romp MD NW:[id number]   CBG monitoring, ED     Status: None   Collection Time: 03/02/17  7:58 AM  Result Value Ref Range   Glucose-Capillary 81 65 - 99 mg/dL   Comment 1 Notify RN    Comment 2 Call MD NNP PA CNM    Comment 3 Document in Chart   I-Stat CG4 Lactic Acid, ED     Status: Abnormal   Collection Time: 03/02/17  9:37 AM  Result Value Ref Range   Lactic Acid, Venous 3.88 (HH) 0.5 - 1.9 mmol/L   Comment NOTIFIED PHYSICIAN   CBG monitoring, ED     Status: None   Collection Time: 03/02/17  9:59 AM  Result Value Ref Range   Glucose-Capillary 85 65 - 99 mg/dL  Procalcitonin     Status: None   Collection Time: 03/02/17 10:06 AM  Result Value Ref Range   Procalcitonin 0.15 ng/mL    Comment:        Interpretation: PCT (Procalcitonin) <= 0.5 ng/mL: Systemic infection (sepsis) is not likely. Local  bacterial infection is possible. (NOTE)         ICU PCT Algorithm               Non ICU PCT Algorithm    ----------------------------     ------------------------------         PCT < 0.25 ng/mL  PCT < 0.1 ng/mL     Stopping of antibiotics            Stopping of antibiotics       strongly encouraged.               strongly encouraged.    ----------------------------     ------------------------------       PCT level decrease by               PCT < 0.25 ng/mL       >= 80% from peak PCT       OR PCT 0.25 - 0.5 ng/mL          Stopping of antibiotics                                             encouraged.     Stopping of antibiotics           encouraged.    ----------------------------     ------------------------------       PCT level decrease by              PCT >= 0.25 ng/mL       < 80% from peak PCT        AND PCT >= 0.5 ng/mL            Continuin g antibiotics                                              encouraged.       Continuing antibiotics            encouraged.    ----------------------------     ------------------------------     PCT level increase compared          PCT > 0.5 ng/mL         with peak PCT AND          PCT >= 0.5 ng/mL             Escalation of antibiotics                                          strongly encouraged.      Escalation of antibiotics        strongly encouraged.   Brain natriuretic peptide     Status: None   Collection Time: 03/02/17 10:09 AM  Result Value Ref Range   B Natriuretic Peptide 54.0 0.0 - 100.0 pg/mL  Lactic acid, plasma     Status: Abnormal   Collection Time: 03/02/17 11:39 AM  Result Value Ref Range   Lactic Acid, Venous 4.0 (HH) 0.5 - 1.9 mmol/L    Comment: CRITICAL RESULT CALLED TO, READ BACK BY AND VERIFIED WITH: H.BOWMAN,RN AT 8144 03/02/17 N.[email or web address]/[email or web address]   CBG monitoring, ED     Status: Abnormal   Collection Time: 03/02/17 12:59 PM  Result Value Ref Range   Glucose-Capillary 137 (H) 65 - 99 mg/dL     US Abdomen Complete  Result Date: 03/02/2017 CLINICAL DATA:  Hepatic cirrhosis.  Sepsis. EXAM: ABDOMEN ULTRASOUND COMPLETE COMPARISON:  CT abdomen and pelvis February 22, 2016; MR abdomen February 23, 2016 FINDINGS: Gallbladder: Within the gallbladder, there are echogenic foci which move and shadow consistent with cholelithiasis. Largest gallstone measures 8 mm. Gallbladder wall appears mildly thickened and slightly edematous. No pericholecystic fluid evident. No sonographic Murphy sign noted by sonographer. Common bile duct: Diameter: 3 mm. No intrahepatic, common hepatic, or common bile duct dilatation. Liver: No focal lesion identified. The liver has a nodular contour and appears rather small overall. The echogenicity of the liver overall is increased. Portal vein is patent on color Doppler imaging with normal direction of blood flow towards the liver. Note, however, that there is recanalization of the umbilical vein. IVC: No abnormality visualized in areas that can be interrogated. Much of the infrahepatic inferior vena cava is obscured by gas. Pancreas: Essentially completely obscured by gas. Spleen: Spleen measures 16.6 x 18.2 x 10.9 cm with a measured splenic volume of 1,724 cubic cm. No focal splenic lesions are evident. Right Kidney: Length: 12.8 cm. Echogenicity within normal limits. No mass or hydronephrosis visualized. Left Kidney: Length: 13.4 cm. Echogenicity within normal limits. No mass or hydronephrosis visualized. Abdominal aorta: Most of aorta obscured by gas. Other findings: No demonstrable ascites. IMPRESSION: 1. Gallstones with mildly thickened and slightly edematous gallbladder wall. This appearance is concerning for potential acute cholecystitis. 2. Liver has a nodular contour consistent with hepatic cirrhosis. Liver echogenicity is increased, a finding that may be seen with hepatic steatosis and/or parenchymal liver disease. While no focal liver lesions are evident on this study, it must  be cautioned that the sensitivity of ultrasound for detection of focal liver lesions is diminished in this circumstance. Portal vein flow is in the anatomic direction. There is recanalization of the umbilical vein. 3.  Splenomegaly. 4.  No demonstrable ascites. 5. Pancreas and most of aorta obscured by gas. Most of the infrahepatic inferior vena cava obscured by gas. Electronically Signed   By: Lowella Grip III M.D.   On: 03/02/2017 11:03   Dg Chest Port 1 View  Result Date: 03/02/2017 CLINICAL DATA:  Shortness of Breath EXAM: PORTABLE CHEST 1 VIEW COMPARISON:  March 02, 2017 study obtained earlier in the day ; January 31, 2017 FINDINGS: There is no appreciable edema or consolidation. Heart is upper normal in size with pulmonary vascularity within normal limits. No adenopathy. There is aortic atherosclerosis. There is an old healed fracture of the left clavicle. IMPRESSION: No edema or consolidation. Stable cardiac silhouette. There is aortic atherosclerosis. Aortic Atherosclerosis (ICD10-I70.0). Electronically Signed   By: Lowella Grip III M.D.   On: 03/02/2017 13:33   Dg Chest Port 1 View  Result Date: 03/02/2017 CLINICAL DATA:  Dyspnea and chest pain x8 hours EXAM: PORTABLE CHEST 1 VIEW COMPARISON:  01/31/2017 FINDINGS: Crowding of interstitial lung markings due to low lung volumes. Atelectasis noted at the right lung base. Accentuated cardiac silhouette size likely due to portable technique. The aorta is not well visualized but the arch due to projection. No pneumonic consolidation, pneumothorax nor definite effusion. No acute nor suspicious osseous abnormality. IMPRESSION: Low lung volumes with crowding of interstitial lung markings. Right basilar atelectasis. Electronically Signed   By: Ashley Royalty M.D.   On: 03/02/2017 03:50    Review of Systems  Constitutional: Positive for fever. Negative for chills, diaphoresis, malaise/fatigue and weight loss.  HENT: Negative.   Eyes: Negative.    Respiratory: Positive for cough, sputum production (clear), shortness of breath (doe) and wheezing (lying flat). Negative for hemoptysis.   Cardiovascular: Positive for  orthopnea, leg swelling and PND. Negative for claudication.  Gastrointestinal: Positive for abdominal pain (pain now mid eipgastric), diarrhea (chronic on Lac[place]lose) and heartburn. Negative for blood in stool, constipation, nausea and vomiting.  Genitourinary: Negative.   Musculoskeletal: Negative.   Skin: Negative.  Negative for itching and rash.       He does have an abrasion LLE posterior ankle.  Neurological: Negative.  Negative for weakness.  Endo/Heme/Allergies: Negative.   Psychiatric/Behavioral: Negative.    Blood pressure (!) 87/61, pulse 86, temperature 100.2 F (37.9 C), temperature source Rectal, resp. rate (!) 29, height 6' (1.829 m), weight 124.7 kg (275 lb), SpO2 92 %. Physical Exam  Constitutional: He is oriented to person, place, and time. He appears well-developed and well-nourished. No distress.  HENT:  Head: Normocephalic.  Mouth/Throat: No oropharyngeal exudate.  Eyes: Right eye exhibits no discharge. Left eye exhibits no discharge. No scleral icterus.  Pupils are equal  Neck: Normal range of motion. Neck supple. No JVD present. No tracheal deviation present. No thyromegaly present.  Cardiovascular: Normal rate, regular rhythm, normal heart sounds and intact distal pulses.   No murmur heard. Respiratory: Effort normal. No respiratory distress. He has wheezes. He has no rales. He exhibits no tenderness.  GI: Soft. Bowel sounds are normal. He exhibits no distension and no mass. There is tenderness (mostly mid epigastric, very uncomfortable on the gurney in the ED.  ). There is no rebound and no guarding.  Huge abdomen I cannot palpate liver or spleen  Musculoskeletal: Normal range of motion. He exhibits edema (+1 bilateral lower legs, skin abrasion LLE posterior ankle). He exhibits no tenderness.   Lymphadenopathy:    He has no cervical adenopathy.  Neurological: He is alert and oriented to person, place, and time. No cranial nerve deficit.  Skin: Skin is warm and dry. No rash noted. He is not diaphoretic. No erythema. No pallor.  Psychiatric: He has a normal mood and affect. His behavior is normal. Judgment and thought content normal.   . piperacillin-tazobactam (ZOSYN)  IV 3.375 g (03/02/17 1150)  . vancomycin 1,250 mg (03/02/17 1539)   Anti-infectives    Start     Dose/Rate Route Frequency Ordered Stop   03/02/17 1500  vancomycin (VANCOCIN) 1,250 mg in sodium chloride 0.9 % 250 mL IVPB     1,250 mg 166.7 mL/hr over 90 Minutes Intravenous Every 12 hours 03/02/17 1016     03/02/17 1000  vancomycin (VANCOCIN) 1,250 mg in sodium chloride 0.9 % 250 mL IVPB  Status:  Discontinued     1,250 mg 166.7 mL/hr over 90 Minutes Intravenous Every 12 hours 03/02/17 0742 03/02/17 1016   03/02/17 1000  piperacillin-tazobactam (ZOSYN) IVPB 3.375 g     3.375 g 12.5 mL/hr over 240 Minutes Intravenous Every 8 hours 03/02/17 0742     03/02/17 0230  vancomycin (VANCOCIN) 2,000 mg in sodium chloride 0.9 % 500 mL IVPB     2,000 mg 250 mL/hr over 120 Minutes Intravenous  Once 03/02/17 0215 03/02/17 0457   03/02/17 0215  piperacillin-tazobactam (ZOSYN) IVPB 3.375 g     3.375 g 100 mL/hr over 30 Minutes Intravenous  Once 03/02/17 0206 03/02/17 0259   03/02/17 0215  vancomycin (VANCOCIN) IVPB 1000 mg/200 mL premix  Status:  Discontinued     1,000 mg 200 mL/hr over 60 Minutes Intravenous  Once 03/02/17 0206 03/02/17 0215      Assessment/Plan: Fever/sepsis - ? GB source - cholelithiasis/cholecystitis NASH cirrhosis/esophageal and rectal varices/portal hypertension Hepatic encephalopathy -  currently better now than on admit Body mass index is 37.3 kg/m.  Hx of tobacco use ongoing x 42 years up to 2-3 PPD - currently down to <1 PPD now CAD - stent/Hx of SVT/Hx of PFO DCHF Type II diabetes FEN:   NPO/IV fluids ID:  Vancomycin/Zosyn 03/02/17 =>> day 1 DVT:  SCD  Plan:  Discussed with Dr. Clementeen Graham.  Pt stable currently, lactate is up.  He is going to get a CT and HIDA.  Will follow closely with you.  Consider IR drain if he is septic from [place] will review shortly.   Hamda Klutts 03/02/2017, 2:37 PM

## 2017-03-02 NOTE — ED Notes (Signed)
Attempted report 

## 2017-03-02 NOTE — H&P (Signed)
History and Physical    DORANCE SPINK YME:[id number] DOB: 20-Mar-1956 DOA: 03/02/2017  PCP: [name], NP  Patient coming from: Home  I have personally briefly reviewed patient's old medical records in [place]  Chief Complaint: AMS  HPI: Adam Butler is a 61 y.o. male with medical history significant of cirrhosis likely NASH, hepatic encephalopathy, esophageal varices, DM2.  Patient is brought in by wife after she woke up noting that he was moaning and confused.  He had been in normal state of health when they went to bed around 10pm.  EMS was called, temperature of 102.6 en route to ED.     ED Course: T 104.2 in ED, RR 40.  Patient was confused, agitated, and answered only to his name.  Wasn't able to provide any history initially.  Given Zosyn and vanc as well as 1L NS bolus.  T has come down to 101.x.  Lactate is 2.6 initially and improves to 2.0.  UA, CXR are negative.  Thankfully patient has improved, is now awake, is reasonable.  He denies any abd pain, rash, N/V (actually is complaining that he is hungry).  Reports very mild headache, no neck stiffness.  Has had about 2 week history of non-productive cough that is unchanged.   Review of Systems: As per HPI otherwise 10 point review of systems negative.   Past Medical History:  Diagnosis Date  . Arthritis   . Cholelithiasis   . Cirrhosis ([place]) 2011   Cryptogenic, Likely NASH. Family/pt deny EtOH. HCV, HBV, HAV negative. ANA negative. AMA positive. Ascites 12/11  . Coronary artery disease    Inferior MI 12/11; LHC with occluded mid CFX and 80% proximal RCA. EF 55%. He had 3.0 x 28 vision BMS to CFX  . Diastolic CHF, acute ([place])    Echo 12/11 with ef 50-55% and mild LVH. EF 55% by LV0gram in 12/11  . Esophageal varices ([place]) 2011, 2013   no hx acute variceal bleed  . Hepatic encephalopathy ([place]) 2011, 12/2013  . Hyperlipemia   . Myocardial infarction [facility]) ? 2012  . PFO (patent foramen ovale):  Per TEE 04/20/2015 04/20/2015  . Portal hypertension ([place])   . Rectal varices   . S/P coronary artery stent placement 06/2010  . SVT (supraventricular tachycardia) ([place])    1/12: appeared to be an ectopic atrial tachycardia. Required DCCV with hemodynamic instability  . Type II diabetes mellitus ([place]) 2011    Past Surgical History:  Procedure Laterality Date  . APPENDECTOMY    . CARDIAC CATHETERIZATION  07/01/2010   BMS to CFX.  Marland Kitchen CATARACT EXTRACTION W/PHACO Left 04/28/2016   Procedure: CATARACT EXTRACTION PHACO AND INTRAOCULAR LENS PLACEMENT (IOC);  Surgeon: Leandrew Koyanagi, MD;  Location: [facility] ORS;  Service: Ophthalmology;  Laterality: Left;  Lot# [id number] H [place]: 05:31.7 AP%: 28.2 CDE: 93.48   . COLONOSCOPY  04/13/2012   Procedure: COLONOSCOPY;  Surgeon: Inda Castle, MD;  Location: [facility] ENDOSCOPY;  Service: Endoscopy;  Laterality: N/A;  . CORONARY ANGIOPLASTY    . CORONARY STENT PLACEMENT  06/30/2010   CFX   Distal        . ESOPHAGEAL BANDING N/A 01/01/2014   Procedure: ESOPHAGEAL BANDING;  Surgeon: Jerene Bears, MD;  Location: [facility] ENDOSCOPY;  Service: Endoscopy;  Laterality: N/A;  . ESOPHAGOGASTRODUODENOSCOPY  04/13/2012   Procedure: ESOPHAGOGASTRODUODENOSCOPY (EGD);  Surgeon: Inda Castle, MD;  Location: [facility];  Service: Endoscopy;  Laterality: N/A;  . ESOPHAGOGASTRODUODENOSCOPY N/A 01/01/2014   Procedure:  ESOPHAGOGASTRODUODENOSCOPY (EGD);  Surgeon: Jerene Bears, MD;  Location: [facility] ENDOSCOPY;  Service: Endoscopy;  Laterality: N/A;  . ESOPHAGOGASTRODUODENOSCOPY (EGD) WITH PROPOFOL N/A 07/12/2016   Procedure: ESOPHAGOGASTRODUODENOSCOPY (EGD) WITH PROPOFOL;  Surgeon: Jerene Bears, MD;  Location: [facility] ENDOSCOPY;  Service: Gastroenterology;  Laterality: N/A;  . LEFT HEART CATHETERIZATION WITH CORONARY ANGIOGRAM N/A 10/15/2014   Procedure: LEFT HEART CATHETERIZATION WITH CORONARY ANGIOGRAM;  Surgeon: Peter M Martinique, MD;  Location: [facility] C[facility] LAB;  Service: Cardiovascular;  Laterality:  N/A;  . orif r leg    . TEE WITHOUT CARDIOVERSION N/A 04/20/2015   Procedure: TRANSESOPHAGEAL ECHOCARDIOGRAM (TEE);  Surgeon: Fay Records, MD;  Location: [facility] ENDOSCOPY;  Service: Cardiovascular;  Laterality: N/A;     reports that he has been smoking E-cigarettes and Cigarettes.  He has a 9.00 pack-year smoking history. He has never used smokeless tobacco. He reports that he does not drink alcohol or use drugs.  Allergies  Allergen Reactions  . Isosorbide Nitrate Other (See Comments)    Nose bleeds    Family History  Problem Relation Age of Onset  . Heart attack Brother 33       MI  . Diabetes Brother   . Heart attack Father   . Diabetes Father   . COPD Father   . COPD Mother   . Heart attack Brother   . Stroke Neg Hx      Prior to Admission medications   Medication Sig Start Date End Date Taking? Authorizing Provider  aspirin 81 MG tablet Take 81 mg by mouth daily.     [provider]  atorvastatin (LIPITOR) 10 MG tablet TAKE 1 TABLET BY MOUTH ONCE DAILY 02/06/17   [name], NP  cyanocobalamin 1000 MCG tablet Take 1,000 mcg by mouth daily.    [provider]  furosemide (LASIX) 40 MG tablet Take 1 tablet (40 mg total) by mouth every morning. Patient taking differently: Take 20 mg by mouth every morning.  05/30/16   Esterwood, Amy S, PA-C  glipiZIDE (GLUCOTROL) 10 MG tablet TAKE ONE TABLET BY MOUTH TWICE DAILY BEFORE  A  MEAL. 12/19/16   [name], NP  lactulose (CONSTULOSE) 10 GM/15ML solution TAKE 45 ML BY MOUTH THREE TIMES DAILY ON SCHEDULE IF NO BOWEL BY 1 PM TAKE ADDITIONAL DOSE 01/16/17   Pyrtle, Lajuan Lines, MD  metFORMIN (GLUCOPHAGE) 1000 MG tablet TAKE 1 TABLET BY MOUTH TWICE DAILY 12/16/16   [name], NP  Multiple Vitamin (MULTIVITAMIN) capsule Take 1 capsule by mouth daily.      [provider]  nadolol (CORGARD) 40 MG tablet Take 1.5 tablets (60 mg) by mouth once daily 01/16/17   Pyrtle, Lajuan Lines, MD  nitroGLYCERIN (NITROSTAT) 0.4 MG SL  tablet Place 1 tablet (0.4 mg total) under the tongue every 5 (five) minutes as needed for chest pain (chest pain). 07/19/16   [name], NP  ranitidine (ZANTAC) 150 MG tablet Take 150 mg by mouth daily.    [provider]  sitaGLIPtin (JANUVIA) 100 MG tablet Take 1 tablet (100 mg total) by mouth daily. 11/21/16   [name], NP  spironolactone (ALDACTONE) 25 MG tablet Take 0.5 tablets (12.5 mg total) by mouth daily. 05/30/16   Alfredia Ferguson, PA-C    Physical Exam: Vitals:   03/02/17 0330 03/02/17 0430 03/02/17 0530 03/02/17 0543  BP: 140/61 (!) 136/110 (!) 104/52   Pulse:  95 (!) 30   Resp:  (!) 37 (!) 30  Temp:    (!) 101.4 F (38.6 C)  TempSrc:    Rectal  SpO2:  93% 100%   Weight:      Height:        Constitutional: NAD, calm, comfortable Eyes: PERRL, lids and conjunctivae normal ENMT: Mucous membranes are moist. Posterior pharynx clear of any exudate or lesions.Normal dentition.  Neck: normal, supple, no masses, no thyromegaly Respiratory: Mild wheezing Cardiovascular: Regular rate and rhythm, no murmurs / rubs / gallops. No extremity edema. 2+ pedal pulses. No carotid bruits.  Abdomen: no tenderness, no masses palpated. No hepatosplenomegaly. Bowel sounds positive.  Musculoskeletal: no clubbing / cyanosis. No joint deformity upper and lower extremities. Good ROM, no contractures. Normal muscle tone.  Skin: No foot ulcers, no rash other than a tiny scrape on back of L heel, doesn't really appear infected though. Neurologic: CN 2-12 grossly intact. Sensation intact, DTR normal. Strength 5/5 in all 4.  Psychiatric: Normal judgment and insight. Alert and oriented x 3. Normal mood.    Labs on Admission: I have personally reviewed following labs and imaging studies  CBC:  Recent Labs Lab 03/02/17 0307  WBC 9.2  NEUTROABS 8.2*  HGB 13.2  HCT 38.1*  MCV 98.2  PLT 65*   Basic Metabolic Panel:  Recent Labs Lab 03/02/17 0307  NA 135  K 3.0*  CL 105   CO2 23  GLUCOSE 124*  BUN 8  CREATININE 0.82  CALCIUM 8.3*   GFR: Estimated Creatinine Clearance: 130.6 mL/min (by C-G formula based on SCr of 0.82 mg/dL). Liver Function Tests:  Recent Labs Lab 03/02/17 0307  AST 70*  ALT 47  ALKPHOS 182*  BILITOT 2.7*  PROT 7.1  ALBUMIN 2.6*    Recent Labs Lab 03/02/17 0307  LIPASE 74*   No results for input(s): AMMONIA in the last 168 hours. Coagulation Profile:  Recent Labs Lab 03/02/17 0307  INR 1.52   Cardiac Enzymes: No results for input(s): CKTOTAL, CKMB, CKMBINDEX, TROPONINI in the last 168 hours. BNP (last 3 results) No results for input(s): PROBNP in the last 8760 hours. HbA1C: No results for input(s): HGBA1C in the last 72 hours. CBG: No results for input(s): GLUCAP in the last 168 hours. Lipid Profile: No results for input(s): CHOL, HDL, LDLCALC, TRIG, CHOLHDL, LDLDIRECT in the last 72 hours. Thyroid Function Tests: No results for input(s): TSH, T4TOTAL, FREET4, T3FREE, THYROIDAB in the last 72 hours. Anemia Panel: No results for input(s): VITAMINB12, FOLATE, FERRITIN, TIBC, IRON, RETICCTPCT in the last 72 hours. Urine analysis:    Component Value Date/Time   COLORURINE AMBER (A) 03/02/2017 0426   APPEARANCEUR CLEAR 03/02/2017 0426   LABSPEC 1.021 03/02/2017 0426   PHURINE 6.0 03/02/2017 0426   GLUCOSEU 150 (A) 03/02/2017 0426   HGBUR MODERATE (A) 03/02/2017 0426   BILIRUBINUR NEGATIVE 03/02/2017 0426   KETONESUR NEGATIVE 03/02/2017 0426   PROTEINUR 30 (A) 03/02/2017 0426   UROBILINOGEN 0.2 05/14/2015 1813   NITRITE NEGATIVE 03/02/2017 0426   LEUKOCYTESUR NEGATIVE 03/02/2017 0426    Radiological Exams on Admission: Dg Chest Port 1 View  Result Date: 03/02/2017 CLINICAL DATA:  Dyspnea and chest pain x8 hours EXAM: PORTABLE CHEST 1 VIEW COMPARISON:  01/31/2017 FINDINGS: Crowding of interstitial lung markings due to low lung volumes. Atelectasis noted at the right lung base. Accentuated cardiac  silhouette size likely due to portable technique. The aorta is not well visualized but the arch due to projection. No pneumonic consolidation, pneumothorax nor definite effusion. No acute nor suspicious osseous  abnormality. IMPRESSION: Low lung volumes with crowding of interstitial lung markings. Right basilar atelectasis. Electronically Signed   By: Ashley Royalty M.D.   On: 03/02/2017 03:50    EKG: Independently reviewed.  Assessment/Plan Principal Problem:   Sepsis ([place]) Active Problems:   DIASTOLIC HEART FAILURE, CHRONIC   Liver cirrhosis secondary to NASH ([place])   Type 2 diabetes mellitus ([place])    1. Sepsis - Unknown source 1. Continue zosyn and vanc for now 2. BCx pending 3. IVF: 1L bolus in ED, will hold off on any more for the moment and instead just "gently hydrate" patient by holding his diuretics for now 4. Ibuprofen for fever 2. Chronic diastolic CHF - 1. Holding diuretics as above 2. Holding beta blocker in setting of sepsis 3. NASH - 1. Continue lactulose 4. DM2 - 1. Holding home PO hypoglycemics 2. Using mod scale SSI AC instead 5. Hypokalemia - replacing K 6. Thrombocytopenia - chronic, secondary to cirrhosis, and stable  DVT prophylaxis: SCDs (patient has chronic thrombocytopenia) Code Status: Full Family Communication: Wife at bedside Disposition Plan: Home after admit Consults called: None Admission status: Admit to inpatient   [place], [place] [facility] Pager [phone]  If 7AM-7PM, please contact day team taking care of patient [email or web address] Password [facility]  03/02/2017, 6:12 AM

## 2017-03-02 NOTE — ED Notes (Signed)
Admitting at bedside 

## 2017-03-02 NOTE — ED Notes (Signed)
Admitting Dhungal made aware of elevated lactic at 3.88.

## 2017-03-03 ENCOUNTER — Encounter (HOSPITAL_COMMUNITY): Payer: Self-pay

## 2017-03-03 DIAGNOSIS — Z823 Family history of stroke: Secondary | ICD-10-CM

## 2017-03-03 DIAGNOSIS — Z825 Family history of asthma and other chronic lower respiratory diseases: Secondary | ICD-10-CM

## 2017-03-03 DIAGNOSIS — A401 Sepsis due to streptococcus, group B: Principal | ICD-10-CM

## 2017-03-03 DIAGNOSIS — R600 Localized edema: Secondary | ICD-10-CM

## 2017-03-03 DIAGNOSIS — F17290 Nicotine dependence, other tobacco product, uncomplicated: Secondary | ICD-10-CM

## 2017-03-03 DIAGNOSIS — E0865 Diabetes mellitus due to underlying condition with hyperglycemia: Secondary | ICD-10-CM

## 2017-03-03 DIAGNOSIS — Z888 Allergy status to other drugs, medicaments and biological substances status: Secondary | ICD-10-CM

## 2017-03-03 DIAGNOSIS — R6520 Severe sepsis without septic shock: Secondary | ICD-10-CM

## 2017-03-03 DIAGNOSIS — Z833 Family history of diabetes mellitus: Secondary | ICD-10-CM

## 2017-03-03 DIAGNOSIS — Z8249 Family history of ischemic heart disease and other diseases of the circulatory system: Secondary | ICD-10-CM

## 2017-03-03 DIAGNOSIS — F17210 Nicotine dependence, cigarettes, uncomplicated: Secondary | ICD-10-CM

## 2017-03-03 LAB — URINE CULTURE: Culture: <10000 — AB

## 2017-03-03 LAB — COMPREHENSIVE METABOLIC PANEL
ALK PHOS: 75 U/L (ref 38–126)
ALT: 45 U/L (ref 17–63)
ANION GAP: 7 (ref 5–15)
AST: 81 U/L — ABNORMAL HIGH (ref 15–41)
Albumin: 2 g/dL — ABNORMAL LOW (ref 3.5–5.0)
BUN: 13 mg/dL (ref 6–20)
CALCIUM: 8 mg/dL — AB (ref 8.9–10.3)
CO2: 23 mmol/L (ref 22–32)
CREATININE: 0.87 mg/dL (ref 0.61–1.24)
Chloride: 107 mmol/L (ref 101–111)
Glucose, Bld: 116 mg/dL — ABNORMAL HIGH (ref 65–99)
Potassium: 3.5 mmol/L (ref 3.5–5.1)
SODIUM: 137 mmol/L (ref 135–145)
TOTAL PROTEIN: 5.8 g/dL — AB (ref 6.5–8.1)
Total Bilirubin: 2.6 mg/dL — ABNORMAL HIGH (ref 0.3–1.2)

## 2017-03-03 LAB — CBC
HCT: 34.1 % — ABNORMAL LOW (ref 39.0–52.0)
HEMOGLOBIN: 11.6 g/dL — AB (ref 13.0–17.0)
MCH: 33.9 pg (ref 26.0–34.0)
MCHC: 34 g/dL (ref 30.0–36.0)
MCV: 99.7 fL (ref 78.0–100.0)
PLATELETS: 39 10*3/uL — AB (ref 150–400)
RBC: 3.42 MIL/uL — AB (ref 4.22–5.81)
RDW: 14.8 % (ref 11.5–15.5)
WBC: 7.8 10*3/uL (ref 4.0–10.5)

## 2017-03-03 LAB — GLUCOSE, CAPILLARY
GLUCOSE-CAPILLARY: 115 mg/dL — AB (ref 65–99)
GLUCOSE-CAPILLARY: 221 mg/dL — AB (ref 65–99)
GLUCOSE-CAPILLARY: 252 mg/dL — AB (ref 65–99)
Glucose-Capillary: 135 mg/dL — ABNORMAL HIGH (ref 65–99)

## 2017-03-03 MED ORDER — AMOXICILLIN 500 MG PO CAPS
500.0000 mg | ORAL_CAPSULE | Freq: Three times a day (TID) | ORAL | Status: DC
  Administered 2017-03-03 – 2017-03-04 (×3): 500 mg via ORAL
  Filled 2017-03-03 (×3): qty 1

## 2017-03-03 MED ORDER — ADULT MULTIVITAMIN W/MINERALS CH: 1.0000 | ORAL_TABLET | Freq: Every day | ORAL | Status: DC

## 2017-03-03 MED ORDER — ASPIRIN EC 81 MG PO TBEC
81.0000 mg | DELAYED_RELEASE_TABLET | Freq: Every day | ORAL | Status: DC
  Administered 2017-03-03 – 2017-03-04 (×2): 81 mg via ORAL
  Filled 2017-03-03 (×2): qty 1

## 2017-03-03 MED ORDER — NADOLOL 20 MG PO TABS
60.0000 mg | ORAL_TABLET | Freq: Every day | ORAL | Status: DC
  Administered 2017-03-03: 16:00:00 60 mg via ORAL
  Filled 2017-03-03 (×3): qty 1

## 2017-03-03 MED ORDER — FUROSEMIDE 40 MG PO TABS
40.0000 mg | ORAL_TABLET | Freq: Every morning | ORAL | Status: DC
  Administered 2017-03-03 – 2017-03-04 (×2): 40 mg via ORAL
  Filled 2017-03-03 (×2): qty 1

## 2017-03-03 MED ORDER — SPIRONOLACTONE 25 MG PO TABS
12.5000 mg | ORAL_TABLET | Freq: Every day | ORAL | Status: DC
  Administered 2017-03-03 – 2017-03-04 (×2): 12.5 mg via ORAL
  Filled 2017-03-03 (×3): qty 1

## 2017-03-03 MED ORDER — FLUTICASONE PROPIONATE 50 MCG/ACT NA SUSP
2.0000 | Freq: Three times a day (TID) | NASAL | Status: DC | PRN
Start: 2017-03-03 — End: 2017-03-04
  Filled 2017-03-03: qty 16

## 2017-03-03 NOTE — Progress Notes (Signed)
Subjective/Chief Complaint: No abd pain   Objective: Vital signs in last 24 hours: Temp:  [98.1 F (36.7 C)-100.2 F (37.9 C)] 98.6 F (37 C) (08/24 0409) Pulse Rate:  [79-91] 79 (08/24 0409) Resp:  [20-37] 20 (08/24 0409) BP: (87-129)/(43-93) 123/60 (08/24 0409) SpO2:  [92 %-100 %] 98 % (08/24 0409) FiO2 (%):  [2 %] 2 % (08/24 0003) Weight:  [130 kg (286 lb 9.6 oz)-133 kg (293 lb 3.4 oz)] 130 kg (286 lb 9.6 oz) (08/24 0409) Last BM Date: 03/01/17  Intake/Output from previous day: 08/23 0701 - 08/24 0700 In: 1000 [I.V.:1000] Out: 1500 [Urine:1500] Intake/Output this shift: No intake/output data recorded.  GI: soft nontender nondistended  Lab Results:   Recent Labs  03/02/17 0307 03/03/17 0349  WBC 9.2 7.8  HGB 13.2 11.6*  HCT 38.1* 34.1*  PLT 65* 39*   BMET  Recent Labs  03/02/17 1449 03/03/17 0349  NA 136 137  K 3.9 3.5  CL 106 107  CO2 22 23  GLUCOSE 140* 116*  BUN 11 13  CREATININE 0.96 0.87  CALCIUM 8.0* 8.0*   PT/INR  Recent Labs  03/02/17 0307  LABPROT 18.5*  INR 1.52   ABG  Recent Labs  03/02/17 0228  PHART 7.523*  HCO3 21.4    Studies/Results: US Abdomen Complete  Result Date: 03/02/2017 CLINICAL DATA:  Hepatic cirrhosis.  Sepsis. EXAM: ABDOMEN ULTRASOUND COMPLETE COMPARISON:  CT abdomen and pelvis February 22, 2016; MR abdomen February 23, 2016 FINDINGS: Gallbladder: Within the gallbladder, there are echogenic foci which move and shadow consistent with cholelithiasis. Largest gallstone measures 8 mm. Gallbladder wall appears mildly thickened and slightly edematous. No pericholecystic fluid evident. No sonographic Murphy sign noted by sonographer. Common bile duct: Diameter: 3 mm. No intrahepatic, common hepatic, or common bile duct dilatation. Liver: No focal lesion identified. The liver has a nodular contour and appears rather small overall. The echogenicity of the liver overall is increased. Portal vein is patent on color Doppler  imaging with normal direction of blood flow towards the liver. Note, however, that there is recanalization of the umbilical vein. IVC: No abnormality visualized in areas that can be interrogated. Much of the infrahepatic inferior vena cava is obscured by gas. Pancreas: Essentially completely obscured by gas. Spleen: Spleen measures 16.6 x 18.2 x 10.9 cm with a measured splenic volume of 1,724 cubic cm. No focal splenic lesions are evident. Right Kidney: Length: 12.8 cm. Echogenicity within normal limits. No mass or hydronephrosis visualized. Left Kidney: Length: 13.4 cm. Echogenicity within normal limits. No mass or hydronephrosis visualized. Abdominal aorta: Most of aorta obscured by gas. Other findings: No demonstrable ascites. IMPRESSION: 1. Gallstones with mildly thickened and slightly edematous gallbladder wall. This appearance is concerning for potential acute cholecystitis. 2. Liver has a nodular contour consistent with hepatic cirrhosis. Liver echogenicity is increased, a finding that may be seen with hepatic steatosis and/or parenchymal liver disease. While no focal liver lesions are evident on this study, it must be cautioned that the sensitivity of ultrasound for detection of focal liver lesions is diminished in this circumstance. Portal vein flow is in the anatomic direction. There is recanalization of the umbilical vein. 3.  Splenomegaly. 4.  No demonstrable ascites. 5. Pancreas and most of aorta obscured by gas. Most of the infrahepatic inferior vena cava obscured by gas. Electronically Signed   By: Lowella Grip III M.D.   On: 03/02/2017 11:03   Ct Abdomen Pelvis W Contrast  Result Date: 03/02/2017 CLINICAL  DATA:  Cirrhosis with hepatic encephalopathy and esophageal varices. Confusion. EXAM: CT ABDOMEN AND PELVIS WITH CONTRAST TECHNIQUE: Multidetector CT imaging of the abdomen and pelvis was performed using the standard protocol following bolus administration of intravenous contrast. CONTRAST:   117m ISOVUE-300 IOPAMIDOL (ISOVUE-300) INJECTION 61% COMPARISON:  02/22/2016 FINDINGS: Lower chest:  Unremarkable. Hepatobiliary: Nodular hepatic contour compatible with cirrhosis. No focal abnormality within the liver parenchyma. The gallbladder is distended with numerous tiny calcified gallstones evident. No intrahepatic or extrahepatic biliary dilation. Pancreas: No focal mass lesion. No dilatation of the main duct. No intraparenchymal cyst. No peripancreatic edema. Spleen: Spleen measures 14 cm cranial caudal length. Adrenals/Urinary Tract: No adrenal nodule or mass. Stable appearance tiny low-density lesions inferior right kidney, likely cysts. 2-3 mm nonobstructing stone in the lower pole left kidney is stable. No evidence for hydroureter. The urinary bladder appears normal for the degree of distention. Stomach/Bowel: Stomach is nondistended. No gastric wall thickening. No evidence of outlet obstruction. Duodenum is normally positioned as is the ligament of Treitz. No small bowel wall thickening. No small bowel dilatation. The terminal ileum is normal. The appendix is not visualized, but there is no edema or inflammation in the region of the cecum. Diverticular changes are noted in the left colon without evidence of diverticulitis. Vascular/Lymphatic: There is abdominal aortic atherosclerosis without aneurysm. Calcification is seen along the porta splenic confluence and proximal portal vein, stable. This may be related to chronic thrombus, but is unchanged. The portal vein does appear patent. Recanalization of the paraumbilical vein and upper abdominal venous collateralization including perigastric and paraesophageal varices are compatible with portal venous hypertension. There is no gastrohepatic or hepatoduodenal ligament lymphadenopathy. No intraperitoneal or retroperitoneal lymphadenopathy. No pelvic sidewall lymphadenopathy. Reproductive: The prostate gland and seminal vesicles have normal imaging  features. Other: Trace fluid is seen adjacent to the liver and in the pelvis. Musculoskeletal: Bone windows reveal no worrisome lytic or sclerotic osseous lesions. IMPRESSION: 1. No new or progressive interval findings. 2. Cirrhosis with portal venous hypertension. Paraesophageal varices evident. Calcification in the wall the portal vein may be related to prior adherent thrombus, but this is stable and portal vein does appear patent on today's exam. 3.  Aortic Atherosclerois (ICD10-170.0) 4. Cholelithiasis 5. Left colonic diverticulosis without diverticulitis. 6. Electronically Signed   By: EMisty StanleyM.D.   On: 03/02/2017 16:56   Dg Chest Port 1 View  Result Date: 03/02/2017 CLINICAL DATA:  Shortness of Breath EXAM: PORTABLE CHEST 1 VIEW COMPARISON:  March 02, 2017 study obtained earlier in the day ; January 31, 2017 FINDINGS: There is no appreciable edema or consolidation. Heart is upper normal in size with pulmonary vascularity within normal limits. No adenopathy. There is aortic atherosclerosis. There is an old healed fracture of the left clavicle. IMPRESSION: No edema or consolidation. Stable cardiac silhouette. There is aortic atherosclerosis. Aortic Atherosclerosis (ICD10-I70.0). Electronically Signed   By: WLowella GripIII M.D.   On: 03/02/2017 13:33   Dg Chest Port 1 View  Result Date: 03/02/2017 CLINICAL DATA:  Dyspnea and chest pain x8 hours EXAM: PORTABLE CHEST 1 VIEW COMPARISON:  01/31/2017 FINDINGS: Crowding of interstitial lung markings due to low lung volumes. Atelectasis noted at the right lung base. Accentuated cardiac silhouette size likely due to portable technique. The aorta is not well visualized but the arch due to projection. No pneumonic consolidation, pneumothorax nor definite effusion. No acute nor suspicious osseous abnormality. IMPRESSION: Low lung volumes with crowding of interstitial lung markings. Right basilar atelectasis. Electronically  Signed   By: Ashley Royalty M.D.    On: 03/02/2017 03:50    Anti-infectives: Anti-infectives    Start     Dose/Rate Route Frequency Ordered Stop   03/02/17 1500  vancomycin (VANCOCIN) 1,250 mg in sodium chloride 0.9 % 250 mL IVPB     1,250 mg 166.7 mL/hr over 90 Minutes Intravenous Every 12 hours 03/02/17 1016     03/02/17 1000  vancomycin (VANCOCIN) 1,250 mg in sodium chloride 0.9 % 250 mL IVPB  Status:  Discontinued     1,250 mg 166.7 mL/hr over 90 Minutes Intravenous Every 12 hours 03/02/17 0742 03/02/17 1016   03/02/17 1000  piperacillin-tazobactam (ZOSYN) IVPB 3.375 g     3.375 g 12.5 mL/hr over 240 Minutes Intravenous Every 8 hours 03/02/17 0742     03/02/17 0230  vancomycin (VANCOCIN) 2,000 mg in sodium chloride 0.9 % 500 mL IVPB     2,000 mg 250 mL/hr over 120 Minutes Intravenous  Once 03/02/17 0215 03/02/17 0457   03/02/17 0215  piperacillin-tazobactam (ZOSYN) IVPB 3.375 g     3.375 g 100 mL/hr over 30 Minutes Intravenous  Once 03/02/17 0206 03/02/17 0259   03/02/17 0215  vancomycin (VANCOCIN) IVPB 1000 mg/200 mL premix  Status:  Discontinued     1,000 mg 200 mL/hr over 60 Minutes Intravenous  Once 03/02/17 0206 03/02/17 0215      Assessment/Plan: Liver failure  Ct negative, exam negative. I dont think needs hida.  I think changes are due to cirrhosis.  No indication for any surgery. Will sign off  [facility] 03/03/2017

## 2017-03-03 NOTE — Consult Note (Signed)
[facility]    Date of Admission:  03/02/2017           Day 3 vancomycin        Day 3 piperacillin tazobactam       Reason for Consult: sepsis due to group B streptococcal bacteremia    Referring Provider: Dr. Flonnie Overman Dhungel  Assessment: He had sepsis caused by group B streptococcus. The most usual sources from skin. He has no obvious cellulitis but his lower extremity edema puts him at risk for streptococcal bacteremia. There are no apparent complications of his bacteremia. This does not require routine echocardiography or repeat blood cultures. We can complete treatment with 7 more days of oral amoxicillin.   Plan: 1. Discontinue vancomycin and piperacillin tazobactam 2. Amoxicillin 500 mg by mouth 3 times daily for 7 days 3. I will sign off now   Principal Problem:   Sepsis due to Streptococcus, group B ([place]) Active Problems:   DIASTOLIC HEART FAILURE, CHRONIC   Liver cirrhosis secondary to NASH ([place])   Type 2 diabetes mellitus ([place])   . atorvastatin  10 mg Oral Daily  . famotidine  20 mg Oral Daily  . insulin aspart  0-15 Units Subcutaneous TID WC  . lactulose  30 g Oral TID  . multivitamin with minerals  1 tablet Oral Daily  . cyanocobalamin  1,000 mcg Oral Daily    HPI: Adam Butler is a 61 y.o. male with cirrhosis related to NASH who became acutely ill with fever up to 102.6 leading to admission on 03/01/2017. He was started on broad empiric antibiotic therapy and has improved back to his normal baseline. One of 2 admission blood cultures has grown group B streptococcus.   Review of Systems: Review of Systems  Constitutional: Positive for chills, fever and malaise/fatigue. Negative for diaphoresis and weight loss.  Respiratory: Positive for cough and shortness of breath. Negative for sputum production.   Cardiovascular: Negative for chest pain.  Gastrointestinal: Negative for abdominal pain, diarrhea, nausea and vomiting.    Genitourinary: Negative for dysuria.  Musculoskeletal: Negative for back pain and joint pain.  Neurological: Negative for headaches.    Past Medical History:  Diagnosis Date  . Arthritis   . Cholelithiasis   . Cirrhosis ([place]) 2011   Cryptogenic, Likely NASH. Family/pt deny EtOH. HCV, HBV, HAV negative. ANA negative. AMA positive. Ascites 12/11  . Coronary artery disease    Inferior MI 12/11; LHC with occluded mid CFX and 80% proximal RCA. EF 55%. He had 3.0 x 28 vision BMS to CFX  . Diastolic CHF, acute ([place])    Echo 12/11 with ef 50-55% and mild LVH. EF 55% by LV0gram in 12/11  . Esophageal varices ([place]) 2011, 2013   no hx acute variceal bleed  . Hepatic encephalopathy ([place]) 2011, 12/2013  . Hyperlipemia   . Myocardial infarction [facility]) ? 2012  . PFO (patent foramen ovale): Per TEE 04/20/2015 04/20/2015  . Portal hypertension ([place])   . Rectal varices   . S/P coronary artery stent placement 06/2010  . SVT (supraventricular tachycardia) ([place])    1/12: appeared to be an ectopic atrial tachycardia. Required DCCV with hemodynamic instability  . Type II diabetes mellitus ([place]) 2011    Social History  Substance Use Topics  . Smoking status: Current Every Day Smoker    Packs/day: 0.25    Years: 36.00    Types: E-cigarettes, Cigarettes  . Smokeless tobacco: Never  Used     Comment: uses vapor cigarettes (2016 ); I smoke about 4 cigarettes a day  . Alcohol use No    Family History  Problem Relation Age of Onset  . Heart attack Brother 25       MI  . Diabetes Brother   . Heart attack Father   . Diabetes Father   . COPD Father   . COPD Mother   . Heart attack Brother   . Stroke Neg Hx    Allergies  Allergen Reactions  . Isosorbide Nitrate Other (See Comments)    Nose bleeds    OBJECTIVE: Blood pressure 102/68, pulse 72, temperature 98 F (36.7 C), temperature source Oral, resp. rate 20, height 5' 7"  (1.702 m), weight 286 lb 9.6 oz (130 kg), SpO2 100 %.  Physical Exam   Constitutional: He is oriented to person, place, and time.  He appears comfortable and in good spirits. He is sitting up in a chair eating breakfast.  Cardiovascular: Normal rate and regular rhythm.   No murmur heard. Pulmonary/Chest: Effort normal. He has no wheezes. He has no rales.  Abdominal: Soft. He exhibits no distension. There is no tenderness.  Musculoskeletal: Normal range of motion. He exhibits edema. He exhibits no tenderness.  1+ edema of lower legs  Neurological: He is alert and oriented to person, place, and time.  Skin: No rash noted.  Psychiatric: Mood and affect normal.    Lab Results Lab Results  Component Value Date   WBC 7.8 03/03/2017   HGB 11.6 (L) 03/03/2017   HCT 34.1 (L) 03/03/2017   MCV 99.7 03/03/2017   PLT 39 (L) 03/03/2017    Lab Results  Component Value Date   CREATININE 0.87 03/03/2017   BUN 13 03/03/2017   NA 137 03/03/2017   K 3.5 03/03/2017   CL 107 03/03/2017   CO2 23 03/03/2017    Lab Results  Component Value Date   ALT 45 03/03/2017   AST 81 (H) 03/03/2017   ALKPHOS 75 03/03/2017   BILITOT 2.6 (H) 03/03/2017     Microbiology: Recent Results (from the past 240 hour(s))  Blood Culture (routine x 2)     Status: Abnormal (Preliminary result)   Collection Time: 03/02/17  3:00 AM  Result Value Ref Range Status   Specimen Description BLOOD RIGHT WRIST  Final   Special Requests IN PEDIATRIC BOTTLE Blood Culture adequate volume  Final   Culture  Setup Time   Final    GRAM POSITIVE COCCI IN PAIRS IN PEDIATRIC BOTTLE Organism ID to follow CRITICAL RESULT CALLED TO, READ BACK BY AND VERIFIED WITH: T. Dang Pharm.D. 17:35 03/02/17 ([email or web address])    Culture (A)  Final    GROUP B STREP(S.AGALACTIAE)ISOLATED SUSCEPTIBILITIES TO FOLLOW    Report Status PENDING  Incomplete  Blood Culture ID Panel (Reflexed)     Status: Abnormal   Collection Time: 03/02/17  3:00 AM  Result Value Ref Range Status   Enterococcus species NOT DETECTED NOT DETECTED  Final   Listeria monocytogenes NOT DETECTED NOT DETECTED Final   Staphylococcus species NOT DETECTED NOT DETECTED Final   Staphylococcus aureus NOT DETECTED NOT DETECTED Final   Streptococcus species DETECTED (A) NOT DETECTED Final    Comment: CRITICAL RESULT CALLED TO, READ BACK BY AND VERIFIED WITH: T. Dang Pharm.D. 17:35 03/02/17 ([email or web address])    Streptococcus agalactiae DETECTED (A) NOT DETECTED Final    Comment: CRITICAL RESULT CALLED TO, READ BACK BY AND VERIFIED WITH: T. Dang Pharm.D. 17:35  03/02/17 ([email or web address])    Streptococcus pneumoniae NOT DETECTED NOT DETECTED Final   Streptococcus pyogenes NOT DETECTED NOT DETECTED Final   Acinetobacter baumannii NOT DETECTED NOT DETECTED Final   Enterobacteriaceae species NOT DETECTED NOT DETECTED Final   Enterobacter cloacae complex NOT DETECTED NOT DETECTED Final   Escherichia coli NOT DETECTED NOT DETECTED Final   Klebsiella oxytoca NOT DETECTED NOT DETECTED Final   Klebsiella pneumoniae NOT DETECTED NOT DETECTED Final   Proteus species NOT DETECTED NOT DETECTED Final   Serratia marcescens NOT DETECTED NOT DETECTED Final   Haemophilus influenzae NOT DETECTED NOT DETECTED Final   Neisseria meningitidis NOT DETECTED NOT DETECTED Final   Pseudomonas aeruginosa NOT DETECTED NOT DETECTED Final   Candida albicans NOT DETECTED NOT DETECTED Final   Candida glabrata NOT DETECTED NOT DETECTED Final   Candida krusei NOT DETECTED NOT DETECTED Final   Candida parapsilosis NOT DETECTED NOT DETECTED Final   Candida tropicalis NOT DETECTED NOT DETECTED Final  Blood Culture (routine x 2)     Status: None (Preliminary result)   Collection Time: 03/02/17  3:02 AM  Result Value Ref Range Status   Specimen Description BLOOD RIGHT FOREARM  Final   Special Requests   Final    BOTTLES DRAWN AEROBIC AND ANAEROBIC Blood Culture adequate volume   Culture  Setup Time   Final    GRAM POSITIVE COCCI IN CHAINS IN PAIRS IN BOTH AEROBIC AND ANAEROBIC BOTTLES CRITICAL  VALUE NOTED.  VALUE IS CONSISTENT WITH PREVIOUSLY REPORTED AND CALLED VALUE.    Culture   Final    GRAM POSITIVE COCCI IN CHAINS IDENTIFICATION TO FOLLOW    Report Status PENDING  Incomplete  Urine culture     Status: Abnormal   Collection Time: 03/02/17  4:26 AM  Result Value Ref Range Status   Specimen Description URINE, CATHETERIZED  Final   Special Requests NONE  Final   Culture <10,000 COLONIES/mL INSIGNIFICANT GROWTH (A)  Final   Report Status 03/03/2017 FINAL  Final    Michel Bickers, MD [facility] [phone] pager   [phone] cell 03/03/2017, 10:44 AM

## 2017-03-03 NOTE — Progress Notes (Signed)
Patient pulled out IV. IV consult placed by previous shift.

## 2017-03-03 NOTE — Progress Notes (Signed)
Patient has taken tele off several times and is not currently refusing it.  Will try again at 4am vitals

## 2017-03-03 NOTE — Progress Notes (Signed)
PROGRESS NOTE                                                                                                                                                                                                             Patient Demographics:    Adam Butler, is a 61 y.o. male, DOB - Jul 22, 1955, SWH:[id number]  Admit date - 03/02/2017   Admitting Physician Etta Quill, DO  Outpatient Primary MD for the patient is Garnette Gunner Coralie Keens, NP  LOS - 1  Outpatient Specialists: Dr Hilarie Fredrickson  Chief Complaint  Patient presents with  . Altered Mental Status  . Fever       Brief Narrative   61 year old male with history of NASH cirrhosis, hepatic encephalopathy, esophageal varices, type 2 diabetes mellitus who was brought to the ED by his wife with acute onset of fevers with chills and confusion. EMS was called who found patient to have a temperature of 102.31F. In the ED patient had severe sepsis with temperature of 104.78F, respiratory rate of 40, tachycardic and lactic acid of 2.6. WBC, chemistry and LFTs were normal. Chest x-ray unremarkable. UA negative for infection. Sepsis pathway was initiated and patient given 1 L IV normal saline bolus and empiric vancomycin and Zosyn after blood cultures sent. During my evaluation in the morning patient was alert and oriented but had increased work of breathing. Had further drop in systolic blood pressure to the 80s and received another liter of normal saline bolus. Patient reported mild headache but no blurred vision, nausea, vomiting, chest pain, shortness of breath, abdominal pain, increased abdominal distention, diarrhea, dysuria, joint pains or muscle aches. No recent sick contact or travel. No new medications. Of note, patient was hospitalized in August 2017 with sepsis and Klebsiella bacteremia suspected to be GI source and was discharged on ciprofloxacin (total 10 day  course). Hospitalist consulted and patient admitted to stepdown unit.    Subjective:   Patient had episodes of low blood pressure yesterday requiring frequent fluid bolus. Blood pressure and tachypnea has improved. Could not tolerate HIDA scan yesterday evening as he was unable to lie down flat on his back for 2 hours due to pain. Patient removing telemetry monitoring during the night. Remains afebrile.   Assessment  & Plan :    Principal Problem:   Severe Sepsis ([place]) No clear source  at present. 1/4 blood culture growing group B streptococcus.. ID consult appreciated and recommends to narrow antibiotic to amoxicillin 500 mg 2 times a day for 7 days as source of infection is likely from the skin. Ultrasound abdomen shows gallstones with mildly thickened and edematous gallbladder concerning for possible acute cholecystitis however he does not have worsened LFTs or abdominal symptoms. CT abdomen unremarkable for acute infection or fluid collection. (shows chronic cirrhotic changes with portal hypertension). Could not tolerate HIDA scan. Seen by surgery and suspect acute cholecystitis unlikely given absence of symptoms.   Active Problems: Acute metabolic encephalopathy Secondary to sepsis. Currently resolved. Head CT on admission unremarkable.  Hypotension Secondary to sepsis. Currently stable. Resume home medications.  Elevated lactic acid Secondary to sepsis and underlying liver cirrhosis.    Liver cirrhosis secondary to NASH [facility]) Follows with Dr. Hilarie Fredrickson. Has chronic thrombocytopenia. Also has history of hepatic encephalopathy and esophageal varices. Continue home dose scheduled lactulose.Resume Lasix and Aldactone.  Ultrasound abdomen negative for ascites.    Type 2 diabetes mellitus (HCC) Monitor on sliding scale coverage.  uncontrolled with A1c of 9.5. Holding glipizide on Januvia and metformin.  Coronary artery disease with history of inferior MI Continue aspirin and  statin.   Code Status : Full code  Family Communication  :None at bedside  Disposition Plan  :Transfer to telemetry   Barriers For Discharge : Active symptoms  Consults  :  Surgery ID  Procedures  :  Ultrasound abdomen CT head CT abdomen  DVT Prophylaxis  : SCDs  Lab Results  Component Value Date   PLT 39 (L) 03/03/2017    Antibiotics  :    Anti-infectives    Start     Dose/Rate Route Frequency Ordered Stop   03/03/17 1400  amoxicillin (AMOXIL) capsule 500 mg     500 mg Oral Every 8 hours 03/03/17 1052     03/02/17 1500  vancomycin (VANCOCIN) 1,250 mg in sodium chloride 0.9 % 250 mL IVPB  Status:  Discontinued     1,250 mg 166.7 mL/hr over 90 Minutes Intravenous Every 12 hours 03/02/17 1016 03/03/17 0953   03/02/17 1000  vancomycin (VANCOCIN) 1,250 mg in sodium chloride 0.9 % 250 mL IVPB  Status:  Discontinued     1,250 mg 166.7 mL/hr over 90 Minutes Intravenous Every 12 hours 03/02/17 0742 03/02/17 1016   03/02/17 1000  piperacillin-tazobactam (ZOSYN) IVPB 3.375 g  Status:  Discontinued     3.375 g 12.5 mL/hr over 240 Minutes Intravenous Every 8 hours 03/02/17 0742 03/03/17 1052   03/02/17 0230  vancomycin (VANCOCIN) 2,000 mg in sodium chloride 0.9 % 500 mL IVPB     2,000 mg 250 mL/hr over 120 Minutes Intravenous  Once 03/02/17 0215 03/02/17 0457   03/02/17 0215  piperacillin-tazobactam (ZOSYN) IVPB 3.375 g     3.375 g 100 mL/hr over 30 Minutes Intravenous  Once 03/02/17 0206 03/02/17 0259   03/02/17 0215  vancomycin (VANCOCIN) IVPB 1000 mg/200 mL premix  Status:  Discontinued     1,000 mg 200 mL/hr over 60 Minutes Intravenous  Once 03/02/17 0206 03/02/17 0215        Objective:   Vitals:   03/02/17 2000 03/03/17 0003 03/03/17 0409 03/03/17 1017  BP: 108/68 (!) 114/93 123/60 102/68  Pulse: 82 82 79 72  Resp: (!) 26 20 20    Temp: 98.2 F (36.8 C) 98.4 F (36.9 C) 98.6 F (37 C) 98 F (36.7 C)  TempSrc: Oral Oral Oral Oral  SpO2: 100% 98% 98% 100%   Weight:   130 kg (286 lb 9.6 oz)   Height:        Wt Readings from Last 3 Encounters:  03/03/17 130 kg (286 lb 9.6 oz)  02/07/17 129.3 kg (285 lb)  12/19/16 129.7 kg (286 lb)     Intake/Output Summary (Last 24 hours) at 03/03/17 1201 Last data filed at 03/03/17 0300  Gross per 24 hour  Intake                0 ml  Output             1150 ml  Net            -1150 ml     Physical Exam  Gen: not in distress HEENT: no pallor, icterus+,  moist mucosa, supple neck Chest:Clear to auscultation bilaterally  CVS: N S1&S2, no murmurs, rubs or gallop GI: soft,nondistended  nontender, bowel sounds present Musculoskeletal: warm, trace edema QSX:QKSKS and oriented, no  tremors    Data Review:    CBC  Recent Labs Lab 03/02/17 0307 03/03/17 0349  WBC 9.2 7.8  HGB 13.2 11.6*  HCT 38.1* 34.1*  PLT 65* 39*  MCV 98.2 99.7  MCH 34.0 33.9  MCHC 34.6 34.0  RDW 14.0 14.8  LYMPHSABS 0.7  --   MONOABS 0.2  --   EOSABS 0.1  --   BASOSABS 0.0  --     Chemistries   Recent Labs Lab 03/02/17 0307 03/02/17 1449 03/03/17 0349  NA 135 136 137  K 3.0* 3.9 3.5  CL 105 106 107  CO2 23 22 23   GLUCOSE 124* 140* 116*  BUN 8 11 13   CREATININE 0.82 0.96 0.87  CALCIUM 8.3* 8.0* 8.0*  AST 70*  --  81*  ALT 47  --  45  ALKPHOS 182*  --  75  BILITOT 2.7*  --  2.6*   ------------------------------------------------------------------------------------------------------------------ No results for input(s): CHOL, HDL, LDLCALC, TRIG, CHOLHDL, LDLDIRECT in the last 72 hours.  Lab Results  Component Value Date   HGBA1C 9.5 (H) 02/24/2017   ------------------------------------------------------------------------------------------------------------------ No results for input(s): TSH, T4TOTAL, T3FREE, THYROIDAB in the last 72 hours.  Invalid input(s): FREET3 ------------------------------------------------------------------------------------------------------------------ No results for  input(s): VITAMINB12, FOLATE, FERRITIN, TIBC, IRON, RETICCTPCT in the last 72 hours.  Coagulation profile  Recent Labs Lab 03/02/17 0307  INR 1.52    No results for input(s): DDIMER in the last 72 hours.  Cardiac Enzymes No results for input(s): CKMB, TROPONINI, MYOGLOBIN in the last 168 hours.  Invalid input(s): CK ------------------------------------------------------------------------------------------------------------------    Component Value Date/Time   BNP 54.0 03/02/2017 1009   BNP 27.7 01/10/2011 0845    Inpatient Medications  Scheduled Meds: . amoxicillin  500 mg Oral Q8H  . atorvastatin  10 mg Oral Daily  . famotidine  20 mg Oral Daily  . insulin aspart  0-15 Units Subcutaneous TID WC  . lactulose  30 g Oral TID  . multivitamin with minerals  1 tablet Oral Daily  . cyanocobalamin  1,000 mcg Oral Daily   Continuous Infusions: . sodium chloride 100 mL/hr at 03/02/17 2238   PRN Meds:.acetaminophen, albuterol, ondansetron **OR** ondansetron (ZOFRAN) IV  Micro Results Recent Results (from the past 240 hour(s))  Blood Culture (routine x 2)     Status: Abnormal (Preliminary result)   Collection Time: 03/02/17  3:00 AM  Result Value Ref Range Status   Specimen Description BLOOD RIGHT WRIST  Final  Special Requests IN PEDIATRIC BOTTLE Blood Culture adequate volume  Final   Culture  Setup Time   Final    GRAM POSITIVE COCCI IN PAIRS IN PEDIATRIC BOTTLE Organism ID to follow CRITICAL RESULT CALLED TO, READ BACK BY AND VERIFIED WITH: T. Dang Pharm.D. 17:35 03/02/17 ([email or web address])    Culture (A)  Final    GROUP B STREP(S.AGALACTIAE)ISOLATED SUSCEPTIBILITIES TO FOLLOW    Report Status PENDING  Incomplete  Blood Culture ID Panel (Reflexed)     Status: Abnormal   Collection Time: 03/02/17  3:00 AM  Result Value Ref Range Status   Enterococcus species NOT DETECTED NOT DETECTED Final   Listeria monocytogenes NOT DETECTED NOT DETECTED Final   Staphylococcus species  NOT DETECTED NOT DETECTED Final   Staphylococcus aureus NOT DETECTED NOT DETECTED Final   Streptococcus species DETECTED (A) NOT DETECTED Final    Comment: CRITICAL RESULT CALLED TO, READ BACK BY AND VERIFIED WITH: T. Dang Pharm.D. 17:35 03/02/17 ([email or web address])    Streptococcus agalactiae DETECTED (A) NOT DETECTED Final    Comment: CRITICAL RESULT CALLED TO, READ BACK BY AND VERIFIED WITH: T. Dang Pharm.D. 17:35 03/02/17 ([email or web address])    Streptococcus pneumoniae NOT DETECTED NOT DETECTED Final   Streptococcus pyogenes NOT DETECTED NOT DETECTED Final   Acinetobacter baumannii NOT DETECTED NOT DETECTED Final   Enterobacteriaceae species NOT DETECTED NOT DETECTED Final   Enterobacter cloacae complex NOT DETECTED NOT DETECTED Final   Escherichia coli NOT DETECTED NOT DETECTED Final   Klebsiella oxytoca NOT DETECTED NOT DETECTED Final   Klebsiella pneumoniae NOT DETECTED NOT DETECTED Final   Proteus species NOT DETECTED NOT DETECTED Final   Serratia marcescens NOT DETECTED NOT DETECTED Final   Haemophilus influenzae NOT DETECTED NOT DETECTED Final   Neisseria meningitidis NOT DETECTED NOT DETECTED Final   Pseudomonas aeruginosa NOT DETECTED NOT DETECTED Final   Candida albicans NOT DETECTED NOT DETECTED Final   Candida glabrata NOT DETECTED NOT DETECTED Final   Candida krusei NOT DETECTED NOT DETECTED Final   Candida parapsilosis NOT DETECTED NOT DETECTED Final   Candida tropicalis NOT DETECTED NOT DETECTED Final  Blood Culture (routine x 2)     Status: None (Preliminary result)   Collection Time: 03/02/17  3:02 AM  Result Value Ref Range Status   Specimen Description BLOOD RIGHT FOREARM  Final   Special Requests   Final    BOTTLES DRAWN AEROBIC AND ANAEROBIC Blood Culture adequate volume   Culture  Setup Time   Final    GRAM POSITIVE COCCI IN CHAINS IN PAIRS IN BOTH AEROBIC AND ANAEROBIC BOTTLES CRITICAL VALUE NOTED.  VALUE IS CONSISTENT WITH PREVIOUSLY REPORTED AND CALLED VALUE.    Culture    Final    GRAM POSITIVE COCCI IN CHAINS IDENTIFICATION TO FOLLOW    Report Status PENDING  Incomplete  Urine culture     Status: Abnormal   Collection Time: 03/02/17  4:26 AM  Result Value Ref Range Status   Specimen Description URINE, CATHETERIZED  Final   Special Requests NONE  Final   Culture <10,000 COLONIES/mL INSIGNIFICANT GROWTH (A)  Final   Report Status 03/03/2017 FINAL  Final    Radiology Reports US Abdomen Complete  Result Date: 03/02/2017 CLINICAL DATA:  Hepatic cirrhosis.  Sepsis. EXAM: ABDOMEN ULTRASOUND COMPLETE COMPARISON:  CT abdomen and pelvis February 22, 2016; MR abdomen February 23, 2016 FINDINGS: Gallbladder: Within the gallbladder, there are echogenic foci which move and shadow consistent with cholelithiasis. Largest gallstone measures 8 mm. Gallbladder wall appears mildly  thickened and slightly edematous. No pericholecystic fluid evident. No sonographic Murphy sign noted by sonographer. Common bile duct: Diameter: 3 mm. No intrahepatic, common hepatic, or common bile duct dilatation. Liver: No focal lesion identified. The liver has a nodular contour and appears rather small overall. The echogenicity of the liver overall is increased. Portal vein is patent on color Doppler imaging with normal direction of blood flow towards the liver. Note, however, that there is recanalization of the umbilical vein. IVC: No abnormality visualized in areas that can be interrogated. Much of the infrahepatic inferior vena cava is obscured by gas. Pancreas: Essentially completely obscured by gas. Spleen: Spleen measures 16.6 x 18.2 x 10.9 cm with a measured splenic volume of 1,724 cubic cm. No focal splenic lesions are evident. Right Kidney: Length: 12.8 cm. Echogenicity within normal limits. No mass or hydronephrosis visualized. Left Kidney: Length: 13.4 cm. Echogenicity within normal limits. No mass or hydronephrosis visualized. Abdominal aorta: Most of aorta obscured by gas. Other findings: No  demonstrable ascites. IMPRESSION: 1. Gallstones with mildly thickened and slightly edematous gallbladder wall. This appearance is concerning for potential acute cholecystitis. 2. Liver has a nodular contour consistent with hepatic cirrhosis. Liver echogenicity is increased, a finding that may be seen with hepatic steatosis and/or parenchymal liver disease. While no focal liver lesions are evident on this study, it must be cautioned that the sensitivity of ultrasound for detection of focal liver lesions is diminished in this circumstance. Portal vein flow is in the anatomic direction. There is recanalization of the umbilical vein. 3.  Splenomegaly. 4.  No demonstrable ascites. 5. Pancreas and most of aorta obscured by gas. Most of the infrahepatic inferior vena cava obscured by gas. Electronically Signed   By: Lowella Grip III M.D.   On: 03/02/2017 11:03   Ct Abdomen Pelvis W Contrast  Result Date: 03/02/2017 CLINICAL DATA:  Cirrhosis with hepatic encephalopathy and esophageal varices. Confusion. EXAM: CT ABDOMEN AND PELVIS WITH CONTRAST TECHNIQUE: Multidetector CT imaging of the abdomen and pelvis was performed using the standard protocol following bolus administration of intravenous contrast. CONTRAST:  129m ISOVUE-300 IOPAMIDOL (ISOVUE-300) INJECTION 61% COMPARISON:  02/22/2016 FINDINGS: Lower chest:  Unremarkable. Hepatobiliary: Nodular hepatic contour compatible with cirrhosis. No focal abnormality within the liver parenchyma. The gallbladder is distended with numerous tiny calcified gallstones evident. No intrahepatic or extrahepatic biliary dilation. Pancreas: No focal mass lesion. No dilatation of the main duct. No intraparenchymal cyst. No peripancreatic edema. Spleen: Spleen measures 14 cm cranial caudal length. Adrenals/Urinary Tract: No adrenal nodule or mass. Stable appearance tiny low-density lesions inferior right kidney, likely cysts. 2-3 mm nonobstructing stone in the lower pole left kidney  is stable. No evidence for hydroureter. The urinary bladder appears normal for the degree of distention. Stomach/Bowel: Stomach is nondistended. No gastric wall thickening. No evidence of outlet obstruction. Duodenum is normally positioned as is the ligament of Treitz. No small bowel wall thickening. No small bowel dilatation. The terminal ileum is normal. The appendix is not visualized, but there is no edema or inflammation in the region of the cecum. Diverticular changes are noted in the left colon without evidence of diverticulitis. Vascular/Lymphatic: There is abdominal aortic atherosclerosis without aneurysm. Calcification is seen along the porta splenic confluence and proximal portal vein, stable. This may be related to chronic thrombus, but is unchanged. The portal vein does appear patent. Recanalization of the paraumbilical vein and upper abdominal venous collateralization including perigastric and paraesophageal varices are compatible with portal venous hypertension. There is no  gastrohepatic or hepatoduodenal ligament lymphadenopathy. No intraperitoneal or retroperitoneal lymphadenopathy. No pelvic sidewall lymphadenopathy. Reproductive: The prostate gland and seminal vesicles have normal imaging features. Other: Trace fluid is seen adjacent to the liver and in the pelvis. Musculoskeletal: Bone windows reveal no worrisome lytic or sclerotic osseous lesions. IMPRESSION: 1. No new or progressive interval findings. 2. Cirrhosis with portal venous hypertension. Paraesophageal varices evident. Calcification in the wall the portal vein may be related to prior adherent thrombus, but this is stable and portal vein does appear patent on today's exam. 3.  Aortic Atherosclerois (ICD10-170.0) 4. Cholelithiasis 5. Left colonic diverticulosis without diverticulitis. 6. Electronically Signed   By: Misty Stanley M.D.   On: 03/02/2017 16:56   Dg Chest Port 1 View  Result Date: 03/02/2017 CLINICAL DATA:  Shortness of  Breath EXAM: PORTABLE CHEST 1 VIEW COMPARISON:  March 02, 2017 study obtained earlier in the day ; January 31, 2017 FINDINGS: There is no appreciable edema or consolidation. Heart is upper normal in size with pulmonary vascularity within normal limits. No adenopathy. There is aortic atherosclerosis. There is an old healed fracture of the left clavicle. IMPRESSION: No edema or consolidation. Stable cardiac silhouette. There is aortic atherosclerosis. Aortic Atherosclerosis (ICD10-I70.0). Electronically Signed   By: Lowella Grip III M.D.   On: 03/02/2017 13:33   Dg Chest Port 1 View  Result Date: 03/02/2017 CLINICAL DATA:  Dyspnea and chest pain x8 hours EXAM: PORTABLE CHEST 1 VIEW COMPARISON:  01/31/2017 FINDINGS: Crowding of interstitial lung markings due to low lung volumes. Atelectasis noted at the right lung base. Accentuated cardiac silhouette size likely due to portable technique. The aorta is not well visualized but the arch due to projection. No pneumonic consolidation, pneumothorax nor definite effusion. No acute nor suspicious osseous abnormality. IMPRESSION: Low lung volumes with crowding of interstitial lung markings. Right basilar atelectasis. Electronically Signed   By: Ashley Royalty M.D.   On: 03/02/2017 03:50    Time Spent in minutes  25   Louellen Molder M.D on 03/03/2017 at 12:01 PM  Between 7am to 7pm - Pager - [phone]  After 7pm go to [email or web address] - password [facility]  [facility] -  Office  [phone]

## 2017-03-04 DIAGNOSIS — G9341 Metabolic encephalopathy: Secondary | ICD-10-CM | POA: Diagnosis present

## 2017-03-04 DIAGNOSIS — A419 Sepsis, unspecified organism: Secondary | ICD-10-CM | POA: Diagnosis present

## 2017-03-04 DIAGNOSIS — K7469 Other cirrhosis of liver: Secondary | ICD-10-CM | POA: Diagnosis present

## 2017-03-04 DIAGNOSIS — R6521 Severe sepsis with septic shock: Secondary | ICD-10-CM

## 2017-03-04 LAB — BASIC METABOLIC PANEL
Anion gap: 6 (ref 5–15)
BUN: 11 mg/dL (ref 6–20)
CO2: 22 mmol/L (ref 22–32)
Calcium: 8.1 mg/dL — ABNORMAL LOW (ref 8.9–10.3)
Chloride: 106 mmol/L (ref 101–111)
Creatinine, Ser: 0.86 mg/dL (ref 0.61–1.24)
GFR calc Af Amer: >60 mL/min (ref >60)
GLUCOSE: 206 mg/dL — AB (ref 65–99)
POTASSIUM: 3.6 mmol/L (ref 3.5–5.1)
Sodium: 134 mmol/L — ABNORMAL LOW (ref 135–145)

## 2017-03-04 LAB — CBC
HCT: 33.1 % — ABNORMAL LOW (ref 39.0–52.0)
Hemoglobin: 11 g/dL — ABNORMAL LOW (ref 13.0–17.0)
MCH: 33.2 pg (ref 26.0–34.0)
MCHC: 33.2 g/dL (ref 30.0–36.0)
MCV: 100 fL (ref 78.0–100.0)
PLATELETS: 44 10*3/uL — AB (ref 150–400)
RBC: 3.31 MIL/uL — AB (ref 4.22–5.81)
RDW: 14.9 % (ref 11.5–15.5)
WBC: 5.5 10*3/uL (ref 4.0–10.5)

## 2017-03-04 LAB — CULTURE, BLOOD (ROUTINE X 2)
SPECIAL REQUESTS: ADEQUATE
SPECIAL REQUESTS: ADEQUATE

## 2017-03-04 LAB — GLUCOSE, CAPILLARY: Glucose-Capillary: 180 mg/dL — ABNORMAL HIGH (ref 65–99)

## 2017-03-04 MED ORDER — AMOXICILLIN 500 MG PO CAPS: 500.0000 mg | ORAL_CAPSULE | Freq: Three times a day (TID) | ORAL | 0 refills | Status: AC

## 2017-03-04 NOTE — Care Management Note (Signed)
Case Management Note  Patient Details  Name: Adam Butler MRN: [id number] Date of Birth: [birth year]  Subjective/Objective:                 Patient with order to DC to home today. Chart reviewed. No Home Health or Equipment needs, no unacknowledged Case Management consults or medication needs identified at the time of this note. Plan for DC to home. If needs arise today prior to discharge, please call Carles Collet RN CM at [phone].    Action/Plan:   Expected Discharge Date:  03/04/17               Expected Discharge Plan:  Home/Self Care  In-House Referral:     Discharge planning Services  CM Consult  Post Acute Care Choice:    Choice offered to:     DME Arranged:    DME Agency:     HH Arranged:    HH Agency:     Status of Service:  Completed, signed off  If discussed at [employer] of Stay Meetings, dates discussed:    Additional Comments:  Carles Collet, RN 03/04/2017, 10:39 AM

## 2017-03-04 NOTE — Discharge Instructions (Signed)
Antibiotic Medicine, Adult Antibiotic medicines treat infections caused by a type of germ called bacteria. They work by killing the bacteria that make you sick. When do I need to take antibiotics? You often need these medicines to treat bacterial infections, such as:  A urinary tract infection (UTI).  Strep throat.  Meningitis. This affects the spinal cord and brain.  A bad lung infection.  You may start the medicines while your doctor waits for tests to come back. When the tests come back, your doctor may change or stop your medicine. When are antibiotics not needed? You do not need these medicines for most common illnesses, such as:  A cold.  The flu.  A sore throat.  Antibiotics are not always needed for all infections caused by bacteria. Do not ask for these medicines, or take them, when they are not needed. What are the risks of taking antibiotics? Most antibiotics can cause an infection called Clostridium difficile.This causes watery poop (diarrhea). Let your doctor know right away if:  You have watery poop while taking an antibiotic.  You have watery poop after you stop taking an antibiotic. The illness can happen weeks after you stop the medicine.  You also have a risk of getting an infection in the future that antibiotics cannot treat (antibiotic-resistant infection). This type of infection can be dangerous. What else should I know about taking antibiotics?  You need to take the entire prescription. ? Take the medicine for as long as told by your doctor. ? Do not stop taking it even if you start to feel better.  Try not to miss any doses. If you miss a dose, call your doctor.  Birth control pills may not work. If you take birth control pills: ? Keep on taking them. ? Use a second form of birth control, such as a condom. Do this for as long as told by your doctor.  Ask your doctor: ? How long to wait in between doses. ? If you should take the medicine with  food. ? If there is anything you should stay away from while taking the antibiotic, such as: ? Food. ? Drinks. ? Medicines. ? If there are any side effects you should watch for.  Only take the medicines that your doctor told you to take. Do not take medicines that were given to someone else.  Drink a large glass of water with the medicine.  Ask the pharmacist for a tool to measure the medicine, such as: ? A syringe. ? A cup. ? A spoon.  Throw away any extra medicine. Contact a doctor if:  You get worse.  You have new joint pain or muscle aches after starting the medicine.  You have side effects from the medicine, such as: ? Stomach pain. ? Watery poop. ? Feeling sick to your stomach (nausea). Get help right away if:  You have signs of a very bad allergic reaction. If this happens, stop taking the medicine right away. Signs may include: ? Hives. These are raised, itchy, red bumps on the skin. ? Skin rash. ? Trouble breathing. ? Wheezing. ? Swelling. ? Feeling dizzy. ? Throwing up (vomiting).  Your pee (urine) is dark, or is the color of blood.  Your skin turns yellow.  You bruise easily.  You bleed easily.  You have very bad watery poop and cramps in your belly.  You have a very bad headache. Summary  Antibiotics are often used to treat infections caused by bacteria.  Only take these  medicines when needed.  Let your doctor know if you have watery poop while taking an antibiotic.  You need to take the entire prescription. This information is not intended to replace advice given to you by your health care provider. Make sure you discuss any questions you have with your health care provider. Document Released: 04/05/2008 Document Revised: 06/29/2016 Document Reviewed: 06/29/2016 Elsevier Interactive Patient Education  2017 [employer].

## 2017-03-04 NOTE — Discharge Summary (Signed)
Physician Discharge Summary  HIEP OLLIS VXB:[id number] DOB: Nov 13, 1955 DOA: 03/02/2017  PCP: [name], NP  Admit date: 03/02/2017 Discharge date: 03/04/2017  Admitted From: Home Disposition:  Home  Recommendations for Outpatient Follow-up:  1. Follow up with PCP in 1 week 2. Patient will complete antibiotic course on 8/31. (total duration of antibiotics : 8 days) 3. Please check lactic acid as outpatient. I have held his metformin until his lactic acid is  normal.  Home Health: None Equipment/Devices: None  Discharge Condition: Fair CODE STATUS: Full code Diet recommendation: Heart healthy/diabetic    Discharge Diagnoses:  Principal Problem:   Sepsis due to Streptococcus, group B ([place])  Active Problems:   Severe sepsis with septic shock (HCC)   Liver cirrhosis secondary to NASH (HCC)   Type 2 diabetes mellitus ([place])   Decompensated liver disease ([place])   Acute metabolic encephalopathy  Brief narrative/history of present illness Please refer to admission H&P for details, in brief,61 year old male with history of NASH cirrhosis, hepatic encephalopathy, esophageal varices, type 2 diabetes mellitus who was brought to the ED by his wife with acute onset of fevers with chills and confusion. EMS was called who found patient to have a temperature of 102.872F. In the ED patient had severe sepsis with temperature of 104.72F, respiratory rate of 40, tachycardic and lactic acid of 2.6. WBC, chemistry and LFTs were normal. Chest x-ray unremarkable. UA negative for infection. Sepsis pathway was initiated and patient given 1 L IV normal saline bolus and empiric vancomycin and Zosyn after blood cultures sent. During my evaluation in the morning patient was alert and oriented but had increased work of breathing. Had further drop in systolic blood pressure to the 80s and received another liter of normal saline bolus. Patient reported mild headache but no blurred vision, nausea,  vomiting, chest pain, shortness of breath, abdominal pain, increased abdominal distention, diarrhea, dysuria, joint pains or muscle aches. No recent sick contact or travel. No new medications. Of note, patient was hospitalized in August 2017 with sepsis and Klebsiella bacteremia suspected to be GI source and was discharged on ciprofloxacin (total 10 day course). Hospitalist consulted and patient admitted to stepdown unit.  Principal Problem:   Severe Sepsis with septic shock ([place]) No clear source at present. 1/4 blood culture growing group B streptococcus.. ID consult appreciated and recommends to narrow antibiotic to amoxicillin 500 mg 2 times a day for 7 days as source of infection is likely from the skin. Ultrasound abdomen shows gallstones with mildly thickened and edematous gallbladder concerning for possible acute cholecystitis however he does not have worsened LFTs or abdominal symptoms. CT abdomen unremarkable for acute infection or fluid collection. (shows chronic cirrhotic changes with portal hypertension). Seen by surgery and does not appear to have cholecystitis and no further imaging needed.  Sepsis has resolved and patient hemodynamically stable. Will discharge him home on oral amoxicillin to complete antibiotic course.   Active Problems: Acute metabolic encephalopathy Secondary to sepsis. Resolved.  Hypotension Secondary to sepsis. Resolved. Resume home medication.  Elevated lactic acid Secondary to sepsis and underlying liver cirrhosis. I have held his metformin and needs to have his lactic acid checked during outpatient follow-up.    Liver cirrhosis secondary to NASH [facility]) Follows with Dr. Hilarie Fredrickson. Has chronic thrombocytopenia. Also has history of hepatic encephalopathy and esophageal varices. Continue home lactulose.Resumed Lasix and Aldactone.  Ultrasound abdomen negative for ascites.    Type 2 diabetes mellitus (HCC)   uncontrolled with A1c of 9.5. Hold  metformin  due to lactic acidosis. Resume glipizide and Januvia and sliding scale insulin.. Outpatient follow-up for tight blood glucose control. Check lactic acid during outpatient visit and resume metformin if appropriate.  Coronary artery disease with history of inferior MI Continue aspirin and statin.     Family Communication  : Wife updated in detail on the phone. Disposition Plan  : Home  Consults  :  Surgery ID   Procedures  :  Ultrasound abdomen CT head CT abdomen  Discharge Instructions   Allergies as of 03/04/2017      Reactions   Isosorbide Nitrate Other (See Comments)   Nose bleeds    Stop this medication: metFORMIN 1000 MG tablet Commonly known as:  GLUCOPHAGE TAKE 1 TABLET BY MOUTH TWICE DAILY What changed:  See the new instructions.   Medication List    TAKE these medications   amoxicillin 500 MG capsule Commonly known as:  AMOXIL Take 1 capsule (500 mg total) by mouth every 8 (eight) hours.   aspirin EC 81 MG tablet Take 81 mg by mouth daily.   atorvastatin 10 MG tablet Commonly known as:  LIPITOR TAKE 1 TABLET BY MOUTH ONCE DAILY What changed:  See the new instructions.   cyanocobalamin 1000 MCG tablet Take 1,000 mcg by mouth daily. Vitamin B12   fluticasone 50 MCG/ACT nasal spray Commonly known as:  FLONASE Place 2 sprays into both nostrils 3 (three) times daily as needed for allergies or rhinitis (congestion).   furosemide 40 MG tablet Commonly known as:  LASIX Take 1 tablet (40 mg total) by mouth every morning. What changed:  when to take this   glipiZIDE 10 MG tablet Commonly known as:  GLUCOTROL TAKE ONE TABLET BY MOUTH TWICE DAILY BEFORE  A  MEAL. What changed:  See the new instructions.   insulin regular 100 units/mL injection Commonly known as:  NOVOLIN R,HUMULIN R Inject 25-80 Units into the skin 3 (three) times daily as needed for high blood sugar (CBG >150).   lactulose 10 GM/15ML solution Commonly known as:   CONSTULOSE TAKE 45 ML BY MOUTH THREE TIMES DAILY ON SCHEDULE IF NO BOWEL BY 1 PM TAKE ADDITIONAL DOSE What changed:  how much to take  how to take this  when to take this  additional instructions      multivitamin with minerals Tabs tablet Take 1 tablet by mouth daily.   nadolol 40 MG tablet Commonly known as:  CORGARD Take 1.5 tablets (60 mg) by mouth once daily What changed:  how much to take  how to take this  when to take this  additional instructions   nitroGLYCERIN 0.4 MG SL tablet Commonly known as:  NITROSTAT Place 1 tablet (0.4 mg total) under the tongue every 5 (five) minutes as needed for chest pain (chest pain). What changed:  reasons to take this   ranitidine 150 MG tablet Commonly known as:  ZANTAC Take 150 mg by mouth See admin instructions. Take 1 tablet (150 mg) by mouth every morning, may also take 1 tablet at night as needed for acid reflux   sitaGLIPtin 100 MG tablet Commonly known as:  JANUVIA Take 1 tablet (100 mg total) by mouth daily.   spironolactone 25 MG tablet Commonly known as:  ALDACTONE Take 0.5 tablets (12.5 mg total) by mouth daily.            Discharge Care Instructions        Start     Ordered   03/04/17 0000  amoxicillin (AMOXIL) 500 MG capsule  Every 8 hours     03/04/17 1029     Follow-up Information    Baity, Coralie Keens, NP. Schedule an appointment as soon as possible for a visit in 1 week(s).   Specialty:  Internal Medicine Contact information: [place] [address] [phone]          Allergies  Allergen Reactions  . Isosorbide Nitrate Other (See Comments)    Nose bleeds        Procedures/Studies: US Abdomen Complete  Result Date: 03/02/2017 CLINICAL DATA:  Hepatic cirrhosis.  Sepsis. EXAM: ABDOMEN ULTRASOUND COMPLETE COMPARISON:  CT abdomen and pelvis February 22, 2016; MR abdomen February 23, 2016 FINDINGS: Gallbladder: Within the gallbladder, there are echogenic foci  which move and shadow consistent with cholelithiasis. Largest gallstone measures 8 mm. Gallbladder wall appears mildly thickened and slightly edematous. No pericholecystic fluid evident. No sonographic Murphy sign noted by sonographer. Common bile duct: Diameter: 3 mm. No intrahepatic, common hepatic, or common bile duct dilatation. Liver: No focal lesion identified. The liver has a nodular contour and appears rather small overall. The echogenicity of the liver overall is increased. Portal vein is patent on color Doppler imaging with normal direction of blood flow towards the liver. Note, however, that there is recanalization of the umbilical vein. IVC: No abnormality visualized in areas that can be interrogated. Much of the infrahepatic inferior vena cava is obscured by gas. Pancreas: Essentially completely obscured by gas. Spleen: Spleen measures 16.6 x 18.2 x 10.9 cm with a measured splenic volume of 1,724 cubic cm. No focal splenic lesions are evident. Right Kidney: Length: 12.8 cm. Echogenicity within normal limits. No mass or hydronephrosis visualized. Left Kidney: Length: 13.4 cm. Echogenicity within normal limits. No mass or hydronephrosis visualized. Abdominal aorta: Most of aorta obscured by gas. Other findings: No demonstrable ascites. IMPRESSION: 1. Gallstones with mildly thickened and slightly edematous gallbladder wall. This appearance is concerning for potential acute cholecystitis. 2. Liver has a nodular contour consistent with hepatic cirrhosis. Liver echogenicity is increased, a finding that may be seen with hepatic steatosis and/or parenchymal liver disease. While no focal liver lesions are evident on this study, it must be cautioned that the sensitivity of ultrasound for detection of focal liver lesions is diminished in this circumstance. Portal vein flow is in the anatomic direction. There is recanalization of the umbilical vein. 3.  Splenomegaly. 4.  No demonstrable ascites. 5. Pancreas and  most of aorta obscured by gas. Most of the infrahepatic inferior vena cava obscured by gas. Electronically Signed   By: Lowella Grip III M.D.   On: 03/02/2017 11:03   Ct Abdomen Pelvis W Contrast  Result Date: 03/02/2017 CLINICAL DATA:  Cirrhosis with hepatic encephalopathy and esophageal varices. Confusion. EXAM: CT ABDOMEN AND PELVIS WITH CONTRAST TECHNIQUE: Multidetector CT imaging of the abdomen and pelvis was performed using the standard protocol following bolus administration of intravenous contrast. CONTRAST:  149m ISOVUE-300 IOPAMIDOL (ISOVUE-300) INJECTION 61% COMPARISON:  02/22/2016 FINDINGS: Lower chest:  Unremarkable. Hepatobiliary: Nodular hepatic contour compatible with cirrhosis. No focal abnormality within the liver parenchyma. The gallbladder is distended with numerous tiny calcified gallstones evident. No intrahepatic or extrahepatic biliary dilation. Pancreas: No focal mass lesion. No dilatation of the main duct. No intraparenchymal cyst. No peripancreatic edema. Spleen: Spleen measures 14 cm cranial caudal length. Adrenals/Urinary Tract: No adrenal nodule or mass. Stable appearance tiny low-density lesions inferior right kidney, likely cysts. 2-3 mm nonobstructing stone in the  lower pole left kidney is stable. No evidence for hydroureter. The urinary bladder appears normal for the degree of distention. Stomach/Bowel: Stomach is nondistended. No gastric wall thickening. No evidence of outlet obstruction. Duodenum is normally positioned as is the ligament of Treitz. No small bowel wall thickening. No small bowel dilatation. The terminal ileum is normal. The appendix is not visualized, but there is no edema or inflammation in the region of the cecum. Diverticular changes are noted in the left colon without evidence of diverticulitis. Vascular/Lymphatic: There is abdominal aortic atherosclerosis without aneurysm. Calcification is seen along the porta splenic confluence and proximal portal  vein, stable. This may be related to chronic thrombus, but is unchanged. The portal vein does appear patent. Recanalization of the paraumbilical vein and upper abdominal venous collateralization including perigastric and paraesophageal varices are compatible with portal venous hypertension. There is no gastrohepatic or hepatoduodenal ligament lymphadenopathy. No intraperitoneal or retroperitoneal lymphadenopathy. No pelvic sidewall lymphadenopathy. Reproductive: The prostate gland and seminal vesicles have normal imaging features. Other: Trace fluid is seen adjacent to the liver and in the pelvis. Musculoskeletal: Bone windows reveal no worrisome lytic or sclerotic osseous lesions. IMPRESSION: 1. No new or progressive interval findings. 2. Cirrhosis with portal venous hypertension. Paraesophageal varices evident. Calcification in the wall the portal vein may be related to prior adherent thrombus, but this is stable and portal vein does appear patent on today's exam. 3.  Aortic Atherosclerois (ICD10-170.0) 4. Cholelithiasis 5. Left colonic diverticulosis without diverticulitis. 6. Electronically Signed   By: Misty Stanley M.D.   On: 03/02/2017 16:56   Dg Chest Port 1 View  Result Date: 03/02/2017 CLINICAL DATA:  Shortness of Breath EXAM: PORTABLE CHEST 1 VIEW COMPARISON:  March 02, 2017 study obtained earlier in the day ; January 31, 2017 FINDINGS: There is no appreciable edema or consolidation. Heart is upper normal in size with pulmonary vascularity within normal limits. No adenopathy. There is aortic atherosclerosis. There is an old healed fracture of the left clavicle. IMPRESSION: No edema or consolidation. Stable cardiac silhouette. There is aortic atherosclerosis. Aortic Atherosclerosis (ICD10-I70.0). Electronically Signed   By: Lowella Grip III M.D.   On: 03/02/2017 13:33   Dg Chest Port 1 View  Result Date: 03/02/2017 CLINICAL DATA:  Dyspnea and chest pain x8 hours EXAM: PORTABLE CHEST 1 VIEW  COMPARISON:  01/31/2017 FINDINGS: Crowding of interstitial lung markings due to low lung volumes. Atelectasis noted at the right lung base. Accentuated cardiac silhouette size likely due to portable technique. The aorta is not well visualized but the arch due to projection. No pneumonic consolidation, pneumothorax nor definite effusion. No acute nor suspicious osseous abnormality. IMPRESSION: Low lung volumes with crowding of interstitial lung markings. Right basilar atelectasis. Electronically Signed   By: Ashley Royalty M.D.   On: 03/02/2017 03:50       Subjective: Continues to  feel better. Blood pressure stable and afebrile.  Discharge Exam: Vitals:   03/04/17 0528 03/04/17 1027  BP: (!) 109/49 (!) 102/57  Pulse: 64 (!) 58  Resp: 18   Temp: 97.7 F (36.5 C)   SpO2: 100%    Vitals:   03/03/17 1556 03/03/17 1956 03/04/17 0528 03/04/17 1027  BP: 105/78 97/72 (!) 109/49 (!) 102/57  Pulse: 68  64 (!) 58  Resp:   18   Temp:  98 F (36.7 C) 97.7 F (36.5 C)   TempSrc:  Oral Oral   SpO2: 97%  100%   Weight:      Height:  Gen.: Middle aged obese male not in distress HEENT: Moist mucosa, supple neck Chest: Clear to auscultation bilaterally CVS: Normal S1 and S2, no murmurs GI: Soft, nondistended, nontender Musculoskeletal: Warm, no edema CNS: Alert and oriented   The results of significant diagnostics from this hospitalization (including imaging, microbiology, ancillary and laboratory) are listed below for reference.     Microbiology: Recent Results (from the past 240 hour(s))  Blood Culture (routine x 2)     Status: Abnormal   Collection Time: 03/02/17  3:00 AM  Result Value Ref Range Status   Specimen Description BLOOD RIGHT WRIST  Final   Special Requests IN PEDIATRIC BOTTLE Blood Culture adequate volume  Final   Culture  Setup Time   Final    GRAM POSITIVE COCCI IN PAIRS IN PEDIATRIC BOTTLE CRITICAL RESULT CALLED TO, READ BACK BY AND VERIFIED WITH: T. Dang  Pharm.D. 17:35 03/02/17 ([email or web address])    Culture GROUP B STREP(S.AGALACTIAE)ISOLATED (A)  Final   Report Status 03/04/2017 FINAL  Final   Organism ID, Bacteria GROUP B STREP(S.AGALACTIAE)ISOLATED  Final      Susceptibility   Group b strep(s.agalactiae)isolated - MIC*    CLINDAMYCIN >=1 RESISTANT Resistant     AMPICILLIN <=0.25 SENSITIVE Sensitive     ERYTHROMYCIN >=8 RESISTANT Resistant     VANCOMYCIN 0.5 SENSITIVE Sensitive     CEFTRIAXONE <=0.12 SENSITIVE Sensitive     LEVOFLOXACIN 1 SENSITIVE Sensitive     * GROUP B STREP(S.AGALACTIAE)ISOLATED  Blood Culture ID Panel (Reflexed)     Status: Abnormal   Collection Time: 03/02/17  3:00 AM  Result Value Ref Range Status   Enterococcus species NOT DETECTED NOT DETECTED Final   Listeria monocytogenes NOT DETECTED NOT DETECTED Final   Staphylococcus species NOT DETECTED NOT DETECTED Final   Staphylococcus aureus NOT DETECTED NOT DETECTED Final   Streptococcus species DETECTED (A) NOT DETECTED Final    Comment: CRITICAL RESULT CALLED TO, READ BACK BY AND VERIFIED WITH: T. Dang Pharm.D. 17:35 03/02/17 ([email or web address])    Streptococcus agalactiae DETECTED (A) NOT DETECTED Final    Comment: CRITICAL RESULT CALLED TO, READ BACK BY AND VERIFIED WITH: T. Dang Pharm.D. 17:35 03/02/17 ([email or web address])    Streptococcus pneumoniae NOT DETECTED NOT DETECTED Final   Streptococcus pyogenes NOT DETECTED NOT DETECTED Final   Acinetobacter baumannii NOT DETECTED NOT DETECTED Final   Enterobacteriaceae species NOT DETECTED NOT DETECTED Final   Enterobacter cloacae complex NOT DETECTED NOT DETECTED Final   Escherichia coli NOT DETECTED NOT DETECTED Final   Klebsiella oxytoca NOT DETECTED NOT DETECTED Final   Klebsiella pneumoniae NOT DETECTED NOT DETECTED Final   Proteus species NOT DETECTED NOT DETECTED Final   Serratia marcescens NOT DETECTED NOT DETECTED Final   Haemophilus influenzae NOT DETECTED NOT DETECTED Final   Neisseria meningitidis NOT DETECTED NOT DETECTED  Final   Pseudomonas aeruginosa NOT DETECTED NOT DETECTED Final   Candida albicans NOT DETECTED NOT DETECTED Final   Candida glabrata NOT DETECTED NOT DETECTED Final   Candida krusei NOT DETECTED NOT DETECTED Final   Candida parapsilosis NOT DETECTED NOT DETECTED Final   Candida tropicalis NOT DETECTED NOT DETECTED Final  Blood Culture (routine x 2)     Status: Abnormal   Collection Time: 03/02/17  3:02 AM  Result Value Ref Range Status   Specimen Description BLOOD RIGHT FOREARM  Final   Special Requests   Final    BOTTLES DRAWN AEROBIC AND ANAEROBIC Blood Culture adequate volume   Culture  Setup Time   Final  GRAM POSITIVE COCCI IN CHAINS IN PAIRS IN BOTH AEROBIC AND ANAEROBIC BOTTLES CRITICAL VALUE NOTED.  VALUE IS CONSISTENT WITH PREVIOUSLY REPORTED AND CALLED VALUE.    Culture (A)  Final    GROUP B STREP(S.AGALACTIAE)ISOLATED SUSCEPTIBILITIES PERFORMED ON PREVIOUS CULTURE WITHIN THE LAST 5 DAYS.    Report Status 03/04/2017 FINAL  Final  Urine culture     Status: Abnormal   Collection Time: 03/02/17  4:26 AM  Result Value Ref Range Status   Specimen Description URINE, CATHETERIZED  Final   Special Requests NONE  Final   Culture <10,000 COLONIES/mL INSIGNIFICANT GROWTH (A)  Final   Report Status 03/03/2017 FINAL  Final     Labs: BNP (last 3 results)  Recent Labs  03/02/17 1009  BNP 50.0   Basic Metabolic Panel:  Recent Labs Lab 03/02/17 0307 03/02/17 1449 03/03/17 0349 03/04/17 0226  NA 135 136 137 134*  K 3.0* 3.9 3.5 3.6  CL 105 106 107 106  CO2 23 22 23 22   GLUCOSE 124* 140* 116* 206*  BUN 8 11 13 11   CREATININE 0.82 0.96 0.87 0.86  CALCIUM 8.3* 8.0* 8.0* 8.1*   Liver Function Tests:  Recent Labs Lab 03/02/17 0307 03/03/17 0349  AST 70* 81*  ALT 47 45  ALKPHOS 182* 75  BILITOT 2.7* 2.6*  PROT 7.1 5.8*  ALBUMIN 2.6* 2.0*    Recent Labs Lab 03/02/17 0307  LIPASE 74*    Recent Labs Lab 03/02/17 0525  AMMONIA 75*   CBC:  Recent  Labs Lab 03/02/17 0307 03/03/17 0349 03/04/17 0226  WBC 9.2 7.8 5.5  NEUTROABS 8.2*  --   --   HGB 13.2 11.6* 11.0*  HCT 38.1* 34.1* 33.1*  MCV 98.2 99.7 100.0  PLT 65* 39* 44*   Cardiac Enzymes: No results for input(s): CKTOTAL, CKMB, CKMBINDEX, TROPONINI in the last 168 hours. BNP: Invalid input(s): POCBNP CBG:  Recent Labs Lab 03/03/17 0617 03/03/17 1151 03/03/17 1632 03/03/17 2102 03/04/17 0608  GLUCAP 115* 221* 252* 135* 180*   D-Dimer No results for input(s): DDIMER in the last 72 hours. Hgb A1c No results for input(s): HGBA1C in the last 72 hours. Lipid Profile No results for input(s): CHOL, HDL, LDLCALC, TRIG, CHOLHDL, LDLDIRECT in the last 72 hours. Thyroid function studies No results for input(s): TSH, T4TOTAL, T3FREE, THYROIDAB in the last 72 hours.  Invalid input(s): FREET3 Anemia work up No results for input(s): VITAMINB12, FOLATE, FERRITIN, TIBC, IRON, RETICCTPCT in the last 72 hours. Urinalysis    Component Value Date/Time   COLORURINE AMBER (A) 03/02/2017 0426   APPEARANCEUR CLEAR 03/02/2017 0426   LABSPEC 1.021 03/02/2017 0426   PHURINE 6.0 03/02/2017 0426   GLUCOSEU 150 (A) 03/02/2017 0426   HGBUR MODERATE (A) 03/02/2017 0426   BILIRUBINUR NEGATIVE 03/02/2017 0426   KETONESUR NEGATIVE 03/02/2017 0426   PROTEINUR 30 (A) 03/02/2017 0426   UROBILINOGEN 0.2 05/14/2015 1813   NITRITE NEGATIVE 03/02/2017 0426   LEUKOCYTESUR NEGATIVE 03/02/2017 0426   Sepsis Labs Invalid input(s): PROCALCITONIN,  WBC,  LACTICIDVEN Microbiology Recent Results (from the past 240 hour(s))  Blood Culture (routine x 2)     Status: Abnormal   Collection Time: 03/02/17  3:00 AM  Result Value Ref Range Status   Specimen Description BLOOD RIGHT WRIST  Final   Special Requests IN PEDIATRIC BOTTLE Blood Culture adequate volume  Final   Culture  Setup Time   Final    GRAM POSITIVE COCCI IN PAIRS IN PEDIATRIC BOTTLE CRITICAL RESULT CALLED  TO, READ BACK BY AND VERIFIED  WITH: T. Dang Pharm.D. 17:35 03/02/17 ([email or web address])    Culture GROUP B STREP(S.AGALACTIAE)ISOLATED (A)  Final   Report Status 03/04/2017 FINAL  Final   Organism ID, Bacteria GROUP B STREP(S.AGALACTIAE)ISOLATED  Final      Susceptibility   Group b strep(s.agalactiae)isolated - MIC*    CLINDAMYCIN >=1 RESISTANT Resistant     AMPICILLIN <=0.25 SENSITIVE Sensitive     ERYTHROMYCIN >=8 RESISTANT Resistant     VANCOMYCIN 0.5 SENSITIVE Sensitive     CEFTRIAXONE <=0.12 SENSITIVE Sensitive     LEVOFLOXACIN 1 SENSITIVE Sensitive     * GROUP B STREP(S.AGALACTIAE)ISOLATED  Blood Culture ID Panel (Reflexed)     Status: Abnormal   Collection Time: 03/02/17  3:00 AM  Result Value Ref Range Status   Enterococcus species NOT DETECTED NOT DETECTED Final   Listeria monocytogenes NOT DETECTED NOT DETECTED Final   Staphylococcus species NOT DETECTED NOT DETECTED Final   Staphylococcus aureus NOT DETECTED NOT DETECTED Final   Streptococcus species DETECTED (A) NOT DETECTED Final    Comment: CRITICAL RESULT CALLED TO, READ BACK BY AND VERIFIED WITH: T. Dang Pharm.D. 17:35 03/02/17 ([email or web address])    Streptococcus agalactiae DETECTED (A) NOT DETECTED Final    Comment: CRITICAL RESULT CALLED TO, READ BACK BY AND VERIFIED WITH: T. Dang Pharm.D. 17:35 03/02/17 ([email or web address])    Streptococcus pneumoniae NOT DETECTED NOT DETECTED Final   Streptococcus pyogenes NOT DETECTED NOT DETECTED Final   Acinetobacter baumannii NOT DETECTED NOT DETECTED Final   Enterobacteriaceae species NOT DETECTED NOT DETECTED Final   Enterobacter cloacae complex NOT DETECTED NOT DETECTED Final   Escherichia coli NOT DETECTED NOT DETECTED Final   Klebsiella oxytoca NOT DETECTED NOT DETECTED Final   Klebsiella pneumoniae NOT DETECTED NOT DETECTED Final   Proteus species NOT DETECTED NOT DETECTED Final   Serratia marcescens NOT DETECTED NOT DETECTED Final   Haemophilus influenzae NOT DETECTED NOT DETECTED Final   Neisseria meningitidis NOT DETECTED  NOT DETECTED Final   Pseudomonas aeruginosa NOT DETECTED NOT DETECTED Final   Candida albicans NOT DETECTED NOT DETECTED Final   Candida glabrata NOT DETECTED NOT DETECTED Final   Candida krusei NOT DETECTED NOT DETECTED Final   Candida parapsilosis NOT DETECTED NOT DETECTED Final   Candida tropicalis NOT DETECTED NOT DETECTED Final  Blood Culture (routine x 2)     Status: Abnormal   Collection Time: 03/02/17  3:02 AM  Result Value Ref Range Status   Specimen Description BLOOD RIGHT FOREARM  Final   Special Requests   Final    BOTTLES DRAWN AEROBIC AND ANAEROBIC Blood Culture adequate volume   Culture  Setup Time   Final    GRAM POSITIVE COCCI IN CHAINS IN PAIRS IN BOTH AEROBIC AND ANAEROBIC BOTTLES CRITICAL VALUE NOTED.  VALUE IS CONSISTENT WITH PREVIOUSLY REPORTED AND CALLED VALUE.    Culture (A)  Final    GROUP B STREP(S.AGALACTIAE)ISOLATED SUSCEPTIBILITIES PERFORMED ON PREVIOUS CULTURE WITHIN THE LAST 5 DAYS.    Report Status 03/04/2017 FINAL  Final  Urine culture     Status: Abnormal   Collection Time: 03/02/17  4:26 AM  Result Value Ref Range Status   Specimen Description URINE, CATHETERIZED  Final   Special Requests NONE  Final   Culture <10,000 COLONIES/mL INSIGNIFICANT GROWTH (A)  Final   Report Status 03/03/2017 FINAL  Final     Time coordinating discharge: Over 30 minutes  SIGNED:   Louellen Molder, MD  [facility] 03/04/2017, 10:30 AM Pager  If 7PM-7AM, please contact night-coverage [email or web address] Password [email or web address]

## 2017-03-07 ENCOUNTER — Encounter: Payer: Self-pay | Admitting: Internal Medicine

## 2017-03-07 ENCOUNTER — Ambulatory Visit (INDEPENDENT_AMBULATORY_CARE_PROVIDER_SITE_OTHER): Payer: PPO | Admitting: Internal Medicine

## 2017-03-07 VITALS — BP 110/76 | HR 61 | Temp 97.7°F | Wt 283.5 lb

## 2017-03-07 DIAGNOSIS — E1165 Type 2 diabetes mellitus with hyperglycemia: Secondary | ICD-10-CM | POA: Diagnosis not present

## 2017-03-07 DIAGNOSIS — Z794 Long term (current) use of insulin: Secondary | ICD-10-CM | POA: Diagnosis not present

## 2017-03-07 DIAGNOSIS — A408 Other streptococcal sepsis: Secondary | ICD-10-CM

## 2017-03-07 NOTE — Patient Instructions (Signed)
Sepsis, Adult Sepsis is a serious bodily reaction to an infection. The infection that causes sepsis may be from a bacteria, a virus, a fungus, or a parasite. Sepsis can result from an infection in any part of the body. Infections that commonly lead to sepsis include skin, lung, and urinary tract infections. Sepsis is a medical emergency that requires immediate treatment at the hospital. In severe cases, it can lead to septic shock. Shock can weaken the heart and cause blood pressure to drop. This can make the central nervous system and the body's organs to stop working. What are the causes? This condition is caused by a severe reaction to a bacterial, viral, fungal, or parasitic infection. The germs that most commonly lead to sepsis include:  Escherichia coli (E. coli).  Staphylococcus aureus (staph).  The most common infections that lead to sepsis include infections of:  The skin.  The lung (pneumonia).  The gut.  The kidneys (urinary tract infection).  What increases the risk? You are more likely to develop this condition if:  You have a weakened disease-fighting (immune) system.  You are 34 or older.  You are male.  You had surgery, or you have been hospitalized.  You have a catheter, breathing tube, or drainage tubes inserted into your body.  You are not getting enough nutrients from food (are malnourished).  You have other long-term (chronic) diseases, including: ? Cancer. ? AIDS. ? Liver disease. ? Lung disease. ? Diabetes.  You have severe burns or injuries.  You inject drugs.  You have heart valve problems.  What are the signs or symptoms? Symptoms of this condition may include:  Fever.  Chills or feeling very cold.  Fast heart rate (tachycardia).  Rapid breathing (hyperventilation).  Shortness of breath.  Confusion or light-headedness.  Changes in skin color. Your skin may look blotchy, pale, or blue.  Cool, clammy skin or sweaty skin.  Skin  rash.  Nausea and vomiting.  Urinating much less than usual.  How is this diagnosed? This condition is diagnosed based on:  Your symptoms.  Your medical history.  A physical exam.  Other tests may also be done to find out the cause of the infection and how severe the sepsis is. These tests may include:  Blood tests.  Urine tests.  Swabs from other areas of the body that may have an infection. These samples may be tested (cultured) to find out what type of bacteria is causing the infection.  Chest X-ray to check for pneumonia. Other imaging tests, such as a CT scan, may also be done.  Lumbar puncture. This is a procedure to remove a small amount of the fluid that surrounds the brain and spinal cord. The fluid is then examined for infection.  How is this treated? This condition is treated in a hospital with antibiotic medicines. You may also receive:  Fluids through an IV tube.  Oxygen and breathing assistance.  Kidney dialysis. This process cleans the blood if the kidneys have failed.  Surgery to remove infected tissue.  Medicines to increase your blood pressure.  Nutrients to correct imbalances in basic body function (metabolism). This may involve receiving important salts and minerals (electrolytes) through an IV and having your blood sugar level adjusted.  Steroid medicines to control your body's reaction to the infection.  Follow these instructions at home: Medicines  Take over-the-counter and prescription medicines only as told by your health care provider.  If you were prescribed an antibiotic or anti-fungal medicine, take it  as told by your health care provider. Do not stop taking the antibiotic or anti-fungal medicine even if you start to feel better. Activity  Rest and gradually return to your normal activities. Ask your health care provider what activities are safe for you.  Try to set small, achievable goals each week, such as dressing yourself,  bathing, or walking up stairs. It may take a while to rebuild your strength.  Try to exercise regularly, if you feel healthy enough to do so. Ask your health care provider what exercises are safe for you. General instructions  Drink enough fluid to keep your urine clear or pale yellow.  Eat a healthy, balanced diet. This includes plenty of fruits and vegetables, whole grains, and lowfat (lean) proteins. Ask your health care provider if you should avoid certain foods.  Keep all follow-up visits as told by your health care provider. This is important. Contact a health care provider if:  You do not feel like you are getting better or regaining strength.  You are having trouble coping with your recovery.  You frequently feel tired.  You feel worse or do not seem to get better after surgery.  You think you may have an infection after surgery. Get help right away if:  You have any symptoms of sepsis.  You have difficulty breathing.  You have a rapid or skipping heartbeat.  You become confused.  You have a high fever.  Your skin becomes blotchy, pale, or blue. These symptoms may represent a serious problem that is an emergency. Do not wait to see if the symptoms will go away. Get medical help right away. Call your local emergency services (911 in the [place] Summary  Sepsis is a medical emergency that requires immediate treatment at the hospital.  This condition is caused by a severe reaction to a bacterial, viral, fungal, or parasitic infection.  This condition is treated in a hospital with antibiotics. Treatment may also include IV fluids, breathing assistance, and kidney dialysis.  If you were prescribed an antibiotic or anti-fungal medicine, take it as told by your health care provider. Do not stop taking the antibiotic or anti-fungal medicine even if you start to feel better. This information is not intended to replace advice given to you by your health care provider. Make  sure you discuss any questions you have with your health care provider. Document Released: 03/26/2003 Document Revised: 05/31/2016 Document Reviewed: 05/31/2016 Elsevier Interactive Patient Education  2017 [employer].

## 2017-03-07 NOTE — Progress Notes (Signed)
Subjective:    Patient ID: Adam Butler, male    DOB: 22-Aug-1955, 61 y.o.   MRN: [id number]  HPI  Pt presents to the clinic today for hospital followup. He was taken to the ER via EMS on 8/23 with c/o fever, chills and confusion. Chest xray was negative. Urinalysis was negative. Lactic acid was elevated. He was hypotensive and ended up receiving a few liters of IV fluid. He was started on Vanc and Zosyn empirically. Blood cultures were positive for Group B Strep, 1/4. ID was consulted, he was switched to oral Amoxicillin. Ultrasound of abdomen showed gallstones and surgery was consulted. They did not feel like this was an acute cholecystitis, and that no intervention was needed at that time. His Metformin was being held, because of his elevated lactic acid. He continues on his Glipizide but does not think he is taking Januvia. He was discharged 8/25. Since discharge, he reports he is feeling much better. He has not ran any fevers or had any chills. He is still taking his abx as prescribed.  Review of Systems      Past Medical History:  Diagnosis Date  . Arthritis   . Cholelithiasis   . Cirrhosis ([place]) 2011   Cryptogenic, Likely NASH. Family/pt deny EtOH. HCV, HBV, HAV negative. ANA negative. AMA positive. Ascites 12/11  . Coronary artery disease    Inferior MI 12/11; LHC with occluded mid CFX and 80% proximal RCA. EF 55%. He had 3.0 x 28 vision BMS to CFX  . Diastolic CHF, acute ([place])    Echo 12/11 with ef 50-55% and mild LVH. EF 55% by LV0gram in 12/11  . Esophageal varices ([place]) 2011, 2013   no hx acute variceal bleed  . Hepatic encephalopathy ([place]) 2011, 12/2013  . Hyperlipemia   . Myocardial infarction [facility]) ? 2012  . PFO (patent foramen ovale): Per [facility]EE 04/20/2015 04/20/2015  . Portal hypertension ([place])   . Rectal varices   . S/P coronary artery stent placement 06/2010  . SVT (supraventricular tachycardia) ([place])    1/12: appeared to be an ectopic atrial tachycardia. Required  DCCV with hemodynamic instability  . Type II diabetes mellitus ([place]) 2011    Current Outpatient Prescriptions  Medication Sig Dispense Refill  . amoxicillin (AMOXIL) 500 MG capsule Take 1 capsule (500 mg total) by mouth every 8 (eight) hours. 18 capsule 0  . aspirin EC 81 MG tablet Take 81 mg by mouth daily.    Marland Kitchen atorvastatin (LIPITOR) 10 MG tablet TAKE 1 TABLET BY MOUTH ONCE DAILY (Patient taking differently: TAKE 1 TABLET (10 MG) BY MOUTH ONCE DAILY) 90 tablet 2  . cyanocobalamin 1000 MCG tablet Take 1,000 mcg by mouth daily. Vitamin B12    . fluticasone (FLONASE) 50 MCG/ACT nasal spray Place 2 sprays into both nostrils 3 (three) times daily as needed for allergies or rhinitis (congestion).    . furosemide (LASIX) 40 MG tablet Take 1 tablet (40 mg total) by mouth every morning. (Patient taking differently: Take 40 mg by mouth daily. ) 90 tablet 3  . glipiZIDE (GLUCOTROL) 10 MG tablet TAKE ONE TABLET BY MOUTH TWICE DAILY BEFORE  A  MEAL. (Patient taking differently: TAKE ONE TABLET (10 MG) BY MOUTH TWICE DAILY BEFORE  A  MEAL.) 180 tablet 0  . insulin regular (NOVOLIN R,HUMULIN R) 100 units/mL injection Inject 25-80 Units into the skin 3 (three) times daily as needed for high blood sugar (CBG >150).    . lactulose (CONSTULOSE) 10 GM/15ML  solution TAKE 45 ML BY MOUTH THREE TIMES DAILY ON SCHEDULE IF NO BOWEL BY 1 PM TAKE ADDITIONAL DOSE (Patient taking differently: Take 30 g by mouth See admin instructions. Take 45 mls (30 g) by mouth three times daily on scheduled - if no bowel movement by 1pm take an additional dose) 5400 mL 2  . metFORMIN (GLUCOPHAGE) 1000 MG tablet TAKE 1 TABLET BY MOUTH TWICE DAILY (Patient taking differently: TAKE 1 TABLET (1000 MG)  BY MOUTH TWICE DAILY) 180 tablet 0  . Multiple Vitamin (MULTIVITAMIN WITH MINERALS) TABS tablet Take 1 tablet by mouth daily.    . nadolol (CORGARD) 40 MG tablet Take 1.5 tablets (60 mg) by mouth once daily (Patient taking differently: Take 60 mg  by mouth daily. ) 45 tablet 2  . nitroGLYCERIN (NITROSTAT) 0.4 MG SL tablet Place 1 tablet (0.4 mg total) under the tongue every 5 (five) minutes as needed for chest pain (chest pain). (Patient taking differently: Place 0.4 mg under the tongue every 5 (five) minutes as needed for chest pain. ) 25 tablet 5  . ranitidine (ZANTAC) 150 MG tablet Take 150 mg by mouth See admin instructions. Take 1 tablet (150 mg) by mouth every morning, may also take 1 tablet at night as needed for acid reflux    . sitaGLIPtin (JANUVIA) 100 MG tablet Take 1 tablet (100 mg total) by mouth daily. (Patient not taking: Reported on 03/02/2017) 30 tablet 3  . spironolactone (ALDACTONE) 25 MG tablet Take 0.5 tablets (12.5 mg total) by mouth daily. 30 tablet 6   No current facility-administered medications for this visit.     Allergies  Allergen Reactions  . Isosorbide Nitrate Other (See Comments)    Nose bleeds    Family History  Problem Relation Age of Onset  . Heart attack Brother 73       MI  . Diabetes Brother   . Heart attack Father   . Diabetes Father   . COPD Father   . COPD Mother   . Heart attack Brother   . Stroke Neg Hx     Social History   Social History  . Marital status: Married    Spouse name: N/A  . Number of children: 3  . Years of education: N/A   Occupational History  . Unemployed Other    Worked in maintenance prior   Social History Main Topics  . Smoking status: Current Every Day Smoker    Packs/day: 0.25    Years: 36.00    Types: E-cigarettes, Cigarettes  . Smokeless tobacco: Never Used     Comment: uses vapor cigarettes (2016 ); I smoke about 4 cigarettes a day  . Alcohol use No  . Drug use: No  . Sexual activity: No   Other Topics Concern  . Not on file   Social History Narrative   Married   Gets regular exercise: walking     Constitutional: Denies fever, malaise, fatigue, headache or abrupt weight changes.  Respiratory: Denies difficulty breathing, shortness of  breath, cough or sputum production.   Cardiovascular: Denies chest pain, chest tightness, palpitations or swelling in the hands or feet.  Gastrointestinal: Pt reports loose stools. Denies abdominal pain, bloating, constipation,  or blood in the stool.  Neurological: Denies dizziness, difficulty with memory, difficulty with speech or problems with balance and coordination.    No other specific complaints in a complete review of systems (except as listed in HPI above).  Objective:   Physical Exam  BP 110/76   Pulse 61   Temp 97.7 F (36.5 C) (Oral)   Wt 283 lb 8 oz (128.6 kg)   SpO2 98%   BMI 44.40 kg/m  Wt Readings from Last 3 Encounters:  03/07/17 283 lb 8 oz (128.6 kg)  03/03/17 286 lb 9.6 oz (130 kg)  02/07/17 285 lb (129.3 kg)    General: Appears his stated age, obese in NAD. Cardiovascular: Normal rate and rhythm.  Pulmonary/Chest: Normal effort and positive vesicular breath sounds. No respiratory distress. No wheezes, rales or ronchi noted.  Musculoskeletal: No difficulty with gait.  Neurological: Alert and oriented.    BMET    Component Value Date/Time   NA 134 (L) 03/04/2017 0226   K 3.6 03/04/2017 0226   CL 106 03/04/2017 0226   CO2 22 03/04/2017 0226   GLUCOSE 206 (H) 03/04/2017 0226   BUN 11 03/04/2017 0226   CREATININE 0.86 03/04/2017 0226   CREATININE 0.74 03/15/2011 0827   CALCIUM 8.1 (L) 03/04/2017 0226   GFRNONAA >60 03/04/2017 0226   GFRAA >60 03/04/2017 0226    Lipid Panel     Component Value Date/Time   CHOL 133 11/17/2016 0854   TRIG 133.0 11/17/2016 0854   HDL 39.90 11/17/2016 0854   CHOLHDL 3 11/17/2016 0854   VLDL 26.6 11/17/2016 0854   LDLCALC 67 11/17/2016 0854    CBC    Component Value Date/Time   WBC 5.5 03/04/2017 0226   RBC 3.31 (L) 03/04/2017 0226   HGB 11.0 (L) 03/04/2017 0226   HCT 33.1 (L) 03/04/2017 0226   PLT 44 (L) 03/04/2017 0226   MCV 100.0 03/04/2017 0226   MCH 33.2 03/04/2017 0226   MCHC 33.2 03/04/2017  0226   RDW 14.9 03/04/2017 0226   LYMPHSABS 0.7 03/02/2017 0307   MONOABS 0.2 03/02/2017 0307   EOSABS 0.1 03/02/2017 0307   BASOSABS 0.0 03/02/2017 0307    Hgb A1C Lab Results  Component Value Date   HGBA1C 9.5 (H) 02/24/2017          Assessment & Plan:   Hospital Follow Up for Group B Strep Sepsis:  Hospital notes, labs and imaging reviewed He will continue to stay well hydrated He will continue the Augmentin until finished He will monitor for weakness, fever or chills No further intervention needed for gallstones at this time, will monitor Recheck Lactic Acid today, although he is requesting to stay off Metformin and continue Glipizide We will call the pharmacy to see if he picked up the [place]  Will follow up after labs, return precautions discussed [name], NP

## 2017-03-09 LAB — LACTIC ACID, PLASMA: LACTIC ACID: 1.1 mmol/L (ref 0.4–1.8)

## 2017-03-10 ENCOUNTER — Encounter: Payer: Self-pay | Admitting: Internal Medicine

## 2017-03-20 ENCOUNTER — Telehealth: Payer: Self-pay | Admitting: Internal Medicine

## 2017-03-20 NOTE — Telephone Encounter (Signed)
Pt states he has been having BRB from his rectum with bowel movements. Also states his stomach hurts and he has lost 10 pounds. Discussed with pt and let him know that if he had any dizziness or SOB he would need to go to the ER. Pt verbalized understanding.

## 2017-03-21 ENCOUNTER — Other Ambulatory Visit (INDEPENDENT_AMBULATORY_CARE_PROVIDER_SITE_OTHER): Payer: PPO

## 2017-03-21 ENCOUNTER — Inpatient Hospital Stay (HOSPITAL_COMMUNITY)
Admission: AD | Admit: 2017-03-21 | Discharge: 2017-03-23 | DRG: 394 | Disposition: A | Discharge status: Home / Self Care | Payer: PPO | Source: Ambulatory Visit | Attending: Internal Medicine | Admitting: Internal Medicine

## 2017-03-21 ENCOUNTER — Encounter (HOSPITAL_COMMUNITY): Payer: Self-pay

## 2017-03-21 ENCOUNTER — Ambulatory Visit (INDEPENDENT_AMBULATORY_CARE_PROVIDER_SITE_OTHER): Payer: PPO | Admitting: Gastroenterology

## 2017-03-21 ENCOUNTER — Encounter: Payer: Self-pay | Admitting: Gastroenterology

## 2017-03-21 VITALS — BP 110/72 | HR 72 | Ht 66.5 in | Wt 274.0 lb

## 2017-03-21 DIAGNOSIS — K7460 Unspecified cirrhosis of liver: Secondary | ICD-10-CM | POA: Diagnosis present

## 2017-03-21 DIAGNOSIS — Z833 Family history of diabetes mellitus: Secondary | ICD-10-CM | POA: Diagnosis not present

## 2017-03-21 DIAGNOSIS — Z794 Long term (current) use of insulin: Secondary | ICD-10-CM | POA: Diagnosis not present

## 2017-03-21 DIAGNOSIS — E119 Type 2 diabetes mellitus without complications: Secondary | ICD-10-CM | POA: Diagnosis present

## 2017-03-21 DIAGNOSIS — Z23 Encounter for immunization: Secondary | ICD-10-CM | POA: Diagnosis present

## 2017-03-21 DIAGNOSIS — Z720 Tobacco use: Secondary | ICD-10-CM | POA: Diagnosis present

## 2017-03-21 DIAGNOSIS — K633 Ulcer of intestine: Secondary | ICD-10-CM | POA: Diagnosis present

## 2017-03-21 DIAGNOSIS — Z955 Presence of coronary angioplasty implant and graft: Secondary | ICD-10-CM

## 2017-03-21 DIAGNOSIS — K529 Noninfective gastroenteritis and colitis, unspecified: Secondary | ICD-10-CM | POA: Diagnosis present

## 2017-03-21 DIAGNOSIS — E1159 Type 2 diabetes mellitus with other circulatory complications: Secondary | ICD-10-CM

## 2017-03-21 DIAGNOSIS — I5032 Chronic diastolic (congestive) heart failure: Secondary | ICD-10-CM | POA: Diagnosis present

## 2017-03-21 DIAGNOSIS — K51918 Ulcerative colitis, unspecified with other complication: Secondary | ICD-10-CM | POA: Diagnosis not present

## 2017-03-21 DIAGNOSIS — Z79899 Other long term (current) drug therapy: Secondary | ICD-10-CM

## 2017-03-21 DIAGNOSIS — R188 Other ascites: Secondary | ICD-10-CM | POA: Diagnosis not present

## 2017-03-21 DIAGNOSIS — F17210 Nicotine dependence, cigarettes, uncomplicated: Secondary | ICD-10-CM | POA: Diagnosis present

## 2017-03-21 DIAGNOSIS — I2510 Atherosclerotic heart disease of native coronary artery without angina pectoris: Secondary | ICD-10-CM | POA: Diagnosis present

## 2017-03-21 DIAGNOSIS — K625 Hemorrhage of anus and rectum: Secondary | ICD-10-CM

## 2017-03-21 DIAGNOSIS — K831 Obstruction of bile duct: Secondary | ICD-10-CM | POA: Diagnosis present

## 2017-03-21 DIAGNOSIS — D6959 Other secondary thrombocytopenia: Secondary | ICD-10-CM | POA: Diagnosis present

## 2017-03-21 DIAGNOSIS — I252 Old myocardial infarction: Secondary | ICD-10-CM | POA: Diagnosis not present

## 2017-03-21 DIAGNOSIS — Z7951 Long term (current) use of inhaled steroids: Secondary | ICD-10-CM | POA: Diagnosis not present

## 2017-03-21 DIAGNOSIS — K921 Melena: Secondary | ICD-10-CM | POA: Diagnosis present

## 2017-03-21 DIAGNOSIS — K6389 Other specified diseases of intestine: Secondary | ICD-10-CM | POA: Diagnosis not present

## 2017-03-21 DIAGNOSIS — R197 Diarrhea, unspecified: Secondary | ICD-10-CM

## 2017-03-21 DIAGNOSIS — E785 Hyperlipidemia, unspecified: Secondary | ICD-10-CM | POA: Diagnosis present

## 2017-03-21 DIAGNOSIS — K640 First degree hemorrhoids: Secondary | ICD-10-CM | POA: Diagnosis present

## 2017-03-21 DIAGNOSIS — D122 Benign neoplasm of ascending colon: Secondary | ICD-10-CM | POA: Diagnosis present

## 2017-03-21 DIAGNOSIS — Z7982 Long term (current) use of aspirin: Secondary | ICD-10-CM

## 2017-03-21 DIAGNOSIS — I868 Varicose veins of other specified sites: Secondary | ICD-10-CM | POA: Diagnosis present

## 2017-03-21 DIAGNOSIS — E876 Hypokalemia: Secondary | ICD-10-CM | POA: Diagnosis present

## 2017-03-21 DIAGNOSIS — R1084 Generalized abdominal pain: Secondary | ICD-10-CM | POA: Diagnosis not present

## 2017-03-21 DIAGNOSIS — E871 Hypo-osmolality and hyponatremia: Secondary | ICD-10-CM | POA: Diagnosis present

## 2017-03-21 DIAGNOSIS — Z6841 Body Mass Index (BMI) 40.0 and over, adult: Secondary | ICD-10-CM | POA: Diagnosis not present

## 2017-03-21 DIAGNOSIS — I8500 Esophageal varices without bleeding: Secondary | ICD-10-CM | POA: Diagnosis present

## 2017-03-21 DIAGNOSIS — I8510 Secondary esophageal varices without bleeding: Secondary | ICD-10-CM | POA: Diagnosis present

## 2017-03-21 DIAGNOSIS — K5730 Diverticulosis of large intestine without perforation or abscess without bleeding: Secondary | ICD-10-CM | POA: Diagnosis present

## 2017-03-21 DIAGNOSIS — K7581 Nonalcoholic steatohepatitis (NASH): Secondary | ICD-10-CM | POA: Diagnosis present

## 2017-03-21 DIAGNOSIS — A09 Infectious gastroenteritis and colitis, unspecified: Secondary | ICD-10-CM | POA: Diagnosis present

## 2017-03-21 LAB — CBC
HCT: 38.9 % — ABNORMAL LOW (ref 39.0–52.0)
Hemoglobin: 13.5 g/dL (ref 13.0–17.0)
MCH: 34.2 pg — AB (ref 26.0–34.0)
MCHC: 34.7 g/dL (ref 30.0–36.0)
MCV: 98.5 fL (ref 78.0–100.0)
PLATELETS: 75 10*3/uL — AB (ref 150–400)
RBC: 3.95 MIL/uL — ABNORMAL LOW (ref 4.22–5.81)
RDW: 14.1 % (ref 11.5–15.5)
WBC: 7.7 10*3/uL (ref 4.0–10.5)

## 2017-03-21 LAB — BASIC METABOLIC PANEL
Anion gap: 6 (ref 5–15)
BUN: 8 mg/dL (ref 6–20)
CALCIUM: 8.5 mg/dL — AB (ref 8.9–10.3)
CHLORIDE: 101 mmol/L (ref 101–111)
CO2: 24 mmol/L (ref 22–32)
CREATININE: 0.82 mg/dL (ref 0.61–1.24)
GFR calc Af Amer: >60 mL/min (ref >60)
Glucose, Bld: 308 mg/dL — ABNORMAL HIGH (ref 65–99)
Potassium: 3.3 mmol/L — ABNORMAL LOW (ref 3.5–5.1)
SODIUM: 131 mmol/L — AB (ref 135–145)

## 2017-03-21 LAB — C DIFFICILE QUICK SCREEN W PCR REFLEX
C DIFFICILE (CDIFF) TOXIN: NEGATIVE
C DIFFICLE (CDIFF) ANTIGEN: NEGATIVE
C Diff interpretation: NOT DETECTED

## 2017-03-21 LAB — CBC WITH DIFFERENTIAL/PLATELET
BASOS PCT: 0.7 % (ref 0.0–3.0)
Basophils Absolute: 0.1 10*3/uL (ref 0.0–0.1)
EOS PCT: 8.2 % — AB (ref 0.0–5.0)
Eosinophils Absolute: 0.6 10*3/uL (ref 0.0–0.7)
HCT: 41.2 % (ref 39.0–52.0)
HEMOGLOBIN: 14 g/dL (ref 13.0–17.0)
Lymphocytes Relative: 21.9 % (ref 12.0–46.0)
Lymphs Abs: 1.7 10*3/uL (ref 0.7–4.0)
MCHC: 34 g/dL (ref 30.0–36.0)
MCV: 102.6 fl — AB (ref 78.0–100.0)
MONO ABS: 1 10*3/uL (ref 0.1–1.0)
MONOS PCT: 12.4 % — AB (ref 3.0–12.0)
Neutro Abs: 4.4 10*3/uL (ref 1.4–7.7)
Neutrophils Relative %: 56.8 % (ref 43.0–77.0)
Platelets: 77 10*3/uL — ABNORMAL LOW (ref 150.0–400.0)
RBC: 4.02 Mil/uL — ABNORMAL LOW (ref 4.22–5.81)
RDW: 14.7 % (ref 11.5–15.5)
WBC: 7.8 10*3/uL (ref 4.0–10.5)

## 2017-03-21 LAB — GLUCOSE, CAPILLARY: GLUCOSE-CAPILLARY: 204 mg/dL — AB (ref 65–99)

## 2017-03-21 MED ORDER — NADOLOL 40 MG PO TABS
60.0000 mg | ORAL_TABLET | Freq: Every day | ORAL | Status: DC
  Administered 2017-03-23: 09:00:00 60 mg via ORAL
  Filled 2017-03-21 (×2): qty 1

## 2017-03-21 MED ORDER — PEG-KCL-NACL-NASULF-NA ASC-C 100 G PO SOLR: 1.0000 | Freq: Once | ORAL | Status: DC

## 2017-03-21 MED ORDER — ONDANSETRON HCL 4 MG/2ML IJ SOLN: 4.0000 mg | Freq: Four times a day (QID) | INTRAMUSCULAR | Status: DC | PRN

## 2017-03-21 MED ORDER — ONDANSETRON HCL 4 MG PO TABS: 4.0000 mg | ORAL_TABLET | Freq: Four times a day (QID) | ORAL | Status: DC | PRN

## 2017-03-21 MED ORDER — FUROSEMIDE 40 MG PO TABS: 40.0000 mg | ORAL_TABLET | Freq: Every morning | ORAL | Status: DC

## 2017-03-21 MED ORDER — INSULIN ASPART 100 UNIT/ML ~~LOC~~ SOLN
0.0000 [IU] | Freq: Three times a day (TID) | SUBCUTANEOUS | Status: DC
  Administered 2017-03-22: 4 [IU] via SUBCUTANEOUS
  Administered 2017-03-22: 7 [IU] via SUBCUTANEOUS
  Administered 2017-03-22: 3 [IU] via SUBCUTANEOUS
  Administered 2017-03-23: 11 [IU] via SUBCUTANEOUS
  Administered 2017-03-23: 4 [IU] via SUBCUTANEOUS

## 2017-03-21 MED ORDER — PEG-KCL-NACL-NASULF-NA ASC-C 100 G PO SOLR
0.5000 | Freq: Once | ORAL | Status: DC
  Filled 2017-03-21: qty 1

## 2017-03-21 MED ORDER — ATORVASTATIN CALCIUM 10 MG PO TABS
10.0000 mg | ORAL_TABLET | Freq: Every day | ORAL | Status: DC
Start: 2017-03-22 — End: 2017-03-23
  Administered 2017-03-23: 10 mg via ORAL
  Filled 2017-03-21: qty 1

## 2017-03-21 MED ORDER — SPIRONOLACTONE 25 MG PO TABS: 12.5000 mg | ORAL_TABLET | Freq: Every day | ORAL | Status: DC

## 2017-03-21 MED ORDER — VITAMIN B-12 1000 MCG PO TABS
1000.0000 ug | ORAL_TABLET | Freq: Every day | ORAL | Status: DC
  Administered 2017-03-23: 1000 ug via ORAL
  Filled 2017-03-21: qty 1

## 2017-03-21 MED ORDER — FLUTICASONE PROPIONATE 50 MCG/ACT NA SUSP
2.0000 | Freq: Three times a day (TID) | NASAL | Status: DC | PRN
  Filled 2017-03-21: qty 16

## 2017-03-21 MED ORDER — INSULIN ASPART 100 UNIT/ML ~~LOC~~ SOLN
0.0000 [IU] | Freq: Every day | SUBCUTANEOUS | Status: DC
  Administered 2017-03-21 – 2017-03-22 (×2): 2 [IU] via SUBCUTANEOUS

## 2017-03-21 MED ORDER — PEG-KCL-NACL-NASULF-NA ASC-C 100 G PO SOLR: 0.5000 | Freq: Once | ORAL | Status: DC

## 2017-03-21 MED ORDER — PEG-KCL-NACL-NASULF-NA ASC-C 100 G PO SOLR
1.0000 | ORAL | Status: AC
  Administered 2017-03-21: 200 g via ORAL
  Filled 2017-03-21: qty 1

## 2017-03-21 NOTE — Progress Notes (Signed)
Discussed with Dr. Loletha Carrow earlier this evening C diff is neg Will prep for colonoscopy tomorrow to evaluate rectal bleeding Discussed with floor nurse by phone tonight who is aware of the prep orders Clear liquids, NPO after MN except for prep

## 2017-03-21 NOTE — Consult Note (Addendum)
Consultation  Referring Provider:   Dr. [name]   Primary Care Physician:  [name], NP Primary Gastroenterologist:   Dr. Hilarie Fredrickson      Reason for Consultation: Hematochezia, Diarrhea, NASH cirrhosis             HPI:   Adam Butler is a 61 y.o. male with past medical history of NASH cirrhosis complicated by portal htn and rectal varices, CAD and diastolic CHF (LVEF 4/65/03 55%) as well as others listed below who presents to the hospital today from our outpatient clinic where he was seen earlier due to a complaint of 4-5 days of rectal bleeding.     Please see outpatient note for further details.    At time of interview today, the patient is noted to be a very poor historian. He explains that he normally takes lactulose 3 times a day and typically has a couple of soft formed stools in the morning, but over the past week or so he has had an increase in the amount of loose stools he experiences. He has been having at least 15-20 loose stools per day and some of these are accompanied by bright red blood. The patient tells me that typically the stools that wake him up at night have more blood in them then the ones during the day. Associated symptoms include lower abdominal cramping which started after his initial increase in stool output. This is may be "slightly better" after he has a bowel movement but tends to build back up again. Patient has continued his lactulose 3 times a day at home. Overall, the patient just explains that "I don't feel well". Apparently he lost his appetite about a week ago and has not been having much to eat. He denies any dizziness or shortness of breath.   Patient denies fever, chills, weight loss, nausea, vomiting, heartburn or reflux.  Previous GI History: 07/12/16-EGD, Dr. Hilarie Fredrickson: non-bleeding >69m esophageal varices, mild portal hypertensive gastropathy, gastroparesis 04/13/12-Colonoscopy, Dr. KDeatra Ina portal hypertensive colopathy, rectal varices, friable  colonic mucosa  Past Medical History:  Diagnosis Date  . Arthritis   . Cholelithiasis   . Cirrhosis ([place] 2011   Cryptogenic, Likely NASH. Family/pt deny EtOH. HCV, HBV, HAV negative. ANA negative. AMA positive. Ascites 12/11  . Coronary artery disease    Inferior MI 12/11; LHC with occluded mid CFX and 80% proximal RCA. EF 55%. He had 3.0 x 28 vision BMS to CFX  . Diastolic CHF, acute ([place]    Echo 12/11 with ef 50-55% and mild LVH. EF 55% by LV0gram in 12/11  . Esophageal varices ([place] 2011, 2013   no hx acute variceal bleed  . Hepatic encephalopathy ([place] 2011, 12/2013  . Hyperlipemia   . Myocardial infarction ([facility] ? 2012  . PFO (patent foramen ovale): Per TEE 04/20/2015 04/20/2015  . Portal hypertension ([place]   . Rectal varices   . S/P coronary artery stent placement 06/2010  . SVT ([place]raventricular tachycardia) ([place]    1/12: appeared to be an ectopic atrial tachycardia. Required DCCV with hemodynamic instability  . Type II diabetes mellitus ([place]    Past Surgical History:  Procedure Laterality Date  . APPENDECTOMY    . CARDIAC CATHETERIZATION  07/01/2010   BMS to CFX.  .Marland KitchenCATARACT EXTRACTION W/PHACO Left 04/28/2016   Procedure: CATARACT EXTRACTION PHACO AND INTRAOCULAR LENS PLACEMENT (IOC);  Surgeon: CLeandrew Koyanagi MD;  Location: [facility] ORS;  Service: Ophthalmology;  Laterality: Left;  Lot# [id number] [place] 05:31.7  AP%: 28.2 CDE: 93.48   . COLONOSCOPY  04/13/2012   Procedure: COLONOSCOPY;  Surgeon: Inda Castle, MD;  [facility];  Service: Endoscopy;  Laterality: N/A;  . CORONARY ANGIOPLASTY    . CORONARY STENT PLACEMENT  06/30/2010   CFX   Distal        . ESOPHAGEAL BANDING N/A 01/01/2014   Procedure: ESOPHAGEAL BANDING;  Surgeon: Jerene Bears, MD;  Location: [place] ENDOSCOPY;  Service: Endoscopy;  Laterality: N/A;  . ESOPHAGOGASTRODUODENOSCOPY  04/13/2012   Procedure: ESOPHAGOGASTRODUODENOSCOPY (EGD);  Surgeon: Inda Castle, MD;  [facility];  Service: Endoscopy;  Laterality: N/A;  . ESOPHAGOGASTRODUODENOSCOPY N/A 01/01/2014   Procedure: ESOPHAGOGASTRODUODENOSCOPY (EGD);  Surgeon: Jerene Bears, MD;  Location[facility] ENDOSCOPY;  Service: Endoscopy;  Laterality: N/A;  . ESOPHAGOGASTRODUODENOSCOPY (EGD) WITH PROPOFOL N/A 07/12/2016   Procedure: ESOPHAGOGASTRODUODENOSCOPY (EGD) WITH PROPOFOL;  Surgeon: Jerene Bears, MD;  [facility];  Service: Gastroenterology;  Laterality: N/A;  . LEFT HEART CATHETERIZATION WITH CORONARY ANGIOGRAM N/A 10/15/2014   Procedure: LEFT HEART CATHETERIZATION WITH CORONARY ANGIOGRAM;  Surgeon: Peter M Martinique, MD;  Location: [facility] CATH LAB;  Service: Cardiovascular;  Laterality: N/A;  . orif r leg    . TEE WITHOUT CARDIOVERSION N/A 04/20/2015   Procedure: TRANSESOPHAGEAL ECHOCARDIOGRAM (TEE);  Surgeon: Fay Records, MD;  Location: [facility] ENDOSCOPY;  Service: Cardiovascular;  Laterality: N/A;    Family History  Problem Relation Age of Onset  . Heart attack Brother 2       MI  . Diabetes Brother   . Heart attack Father   . Diabetes Father   . COPD Father   . COPD Mother   . Heart attack Brother   . Stroke Neg Hx     Social History  Substance Use Topics  . Smoking status: Current Every Day Smoker    Packs/day: 0.25    Years: 36.00    Types: E-cigarettes, Cigarettes  . Smokeless tobacco: Never Used     Comment: uses vapor cigarettes (2016 ); I smoke about 4 cigarettes a day  . Alcohol use No    Prior to Admission medications   Medication Sig Start Date End Date Taking? Authorizing Provider  aspirin EC 81 MG tablet Take 81 mg by mouth daily.   Yes [provider]  atorvastatin (LIPITOR) 10 MG tablet TAKE 1 TABLET BY MOUTH ONCE DAILY 02/06/17  Yes 39, Coralie Keens, NP  cyanocobalamin 1000 MCG tablet Take 1,000 mcg by mouth daily. Vitamin B12   Yes [provider]  fluticasone (FLONASE) 50 MCG/ACT nasal spray Place 2 sprays into both nostrils 3 (three) times daily as needed  for allergies or rhinitis (congestion).   Yes [provider]  furosemide (LASIX) 40 MG tablet Take 1 tablet (40 mg total) by mouth every morning. 05/30/16  Yes Esterwood, Amy S, PA-C  glipiZIDE (GLUCOTROL) 10 MG tablet TAKE ONE TABLET BY MOUTH TWICE DAILY BEFORE  A  MEAL. 12/19/16  Yes Baity, Coralie Keens, NP  insulin regular (NOVOLIN R,HUMULIN R) 100 units/mL injection Inject 25-80 Units into the skin 3 (three) times daily as needed for high blood sugar (CBG >150).   Yes [provider]  lactulose (CONSTULOSE) 10 GM/15ML solution TAKE 45 ML BY MOUTH THREE TIMES DAILY ON SCHEDULE IF NO BOWEL BY 1 PM TAKE ADDITIONAL DOSE 01/16/17  Yes Pyrtle, Lajuan Lines, MD  Multiple Vitamin (MULTIVITAMIN WITH MINERALS) TABS tablet Take 1 tablet by mouth daily.   Yes [provider]  nadolol (CORGARD) 40 MG tablet Take 1.5 tablets (60 mg) by mouth once daily 01/16/17  Yes Pyrtle, Lajuan Lines, MD  nitroGLYCERIN (NITROSTAT) 0.4 MG SL tablet Place 1 tablet (0.4 mg total) under the tongue every 5 (five) minutes as needed for chest pain (chest pain). 07/19/16  Yes Baity, Coralie Keens, NP  ranitidine (ZANTAC) 150 MG tablet Take 150 mg by mouth See admin instructions. Take 1 tablet (150 mg) by mouth every morning, may also take 1 tablet at night as needed for acid reflux   Yes [provider]  spironolactone (ALDACTONE) 25 MG tablet Take 0.5 tablets (12.5 mg total) by mouth daily. 05/30/16  Yes Esterwood, Amy S, PA-C    No current facility-administered medications for this encounter.     Allergies as of 03/21/2017 - Review Complete 03/21/2017  Allergen Reaction Noted  . Isosorbide nitrate Other (See Comments) 02/21/2015     Review of Systems:    Constitutional: No weight loss, fever or chills Skin: No rash  Cardiovascular: No chest pain Respiratory: No SOB Gastrointestinal: See HPI and otherwise negative Genitourinary: No dysuria Neurological: No headache, dizziness or syncope Musculoskeletal: No new  muscle or joint pain Hematologic: No bruising Psychiatric: No history of depression or anxiety   Physical Exam:  Vital signs in last 24 hours: Temp:  [98.3 F (36.8 C)] 98.3 F (36.8 C) (09/11 1416) Pulse Rate:  [63-72] 63 (09/11 1416) Resp:  [16] 16 (09/11 1416) BP: (110-135)/(72-73) 135/73 (09/11 1416) SpO2:  [97 %-98 %] 97 % (09/11 1416) Weight:  [274 lb (124.3 kg)] 274 lb (124.3 kg) (09/11 0831)   General:   Obese Caucasian male appears to be in mild distress, Well developed, Well nourished, alert and cooperative Head:  Normocephalic and atraumatic. Eyes:   PEERL, EOMI. No icterus. Conjunctiva pink. Ears:  Normal auditory acuity. Neck:  Supple Throat: Oral cavity and pharynx without inflammation, swelling or lesion. Poor dentition Lungs: Respirations even and unlabored. Lungs clear to auscultation bilaterally.   No wheezes, crackles, or rhonchi.  Heart: Normal S1, S2. No MRG. Regular rate and rhythm. No peripheral edema, cyanosis or pallor.  Abdomen:  Soft, nondistended, mild b/l lower abdominal ttp. No rebound or guarding. Normal bowel sounds. No appreciable masses or hepatomegaly. Rectal:  Not performed.  Msk:  Symmetrical without gross deformities.  Extremities:  Without edema, no deformity or joint abnormality. Neurologic:  Alert and  oriented x4;  grossly normal neurologically.  Skin:   Dry and intact without significant lesions or rashes. Psychiatric: Demonstrates good judgement and reason without abnormal affect or behaviors.  LAB RESULTS:  Recent Labs  03/21/17 1027  WBC 7.8  HGB 14.0  HCT 41.2  PLT 77.0*    PREVIOUS ENDOSCOPIES:            See HPI   Impression / Plan:   Impression: 1. Rectal Bleeding: h/o rectal varices, now with rectal bleeding x 4-5d, 15-20 stools per day per pt, has continued Lactulose TID over this time, likely contributing; Concern for rectal varices vs hemorrhoids vs other 2. NASH Cirrhosis 3. Diarrhea: Increase in frequency of  stools, lactulose likely contributing; Consider infectious cause vs other  Plan: 1. Per Dr. Coralie Carpen, PA-C- pt would benefit from colonoscopy for further eval of rectal bleeding, especially with h/o rectal varices in the past. We will arrange. Discussed risks, benefits, limitations and alternatives and the patient agrees to proceed. (May hold on this if Cdiff positive) 2. Patient to remain on clear liquid diet today  and NPO after midnight 3. Currently with increase in stools and abdominal pain suspect C.Diff, certainly at increased risk after recent hospitalization and abx-ordered stool studies below. 4. Hold Lactulose 5. Ordered stool studies including Gi pathogen panel and C Diff- If positive, will hole on colonoscopy for now, if negative will proceed tomorrow as planned-prep will be ordered tonight if results negative. 6. Please await final recommendations from Dr. Loletha Carrow  Thank you for your kind consultation, we will continue to follow.  Lavone Nian Lemmon  03/21/2017, 3:32 PM Pager #: [phone]  I have reviewed the entire case in detail with the above APP and discussed the plan in detail.  Therefore, I agree with the diagnoses recorded above. In addition,  I have personally interviewed and examined the patient and have personally reviewed any abdominal/pelvic CT scan images.  My additional thoughts are as follows: Add'l Dx:  Non-bleeding esophageal varices, internal hemorrhoids, rectal varices, hepatic encephalopathy, portal hypertensive ascites, CAD, CHF  I am suspicious of C difficile due to recent hospitalization and antibiotic treatment for group B strep sepsis (ID felt it was from skin source, according to d/c summary). He does not appear to be having a brisk GI bleed , and I suspect his known large internal hemorrhoids have been exacerbated by intractable diarrhea.  I will speak with Dr. Hilarie Fredrickson, who is on call tonight.  If C diff testing returns positive later this  evening, then vancomycin will be started and the colonoscopy canceled.  If testing negative, then he will order a bowel preparation to be taken tonight for colonoscopy tomorrow.  Monitor twice daily CBC.  Colonoscopy would be an increased risk procedure due to his medical co-morbidities noted above.  [name] Pager [phone]  Mon-Fri 8a-5p [phone] after 5p, weekends, holidays

## 2017-03-21 NOTE — H&P (Addendum)
History and Physical  Adam Butler QVZ:[id number] DOB: [birth year] DOA: 03/21/2017  Referring physician: Linna Darner who spoke with [facility]  PCP: [name], NP  Outpatient Specialists: [facility] Patient coming from: Home & is able to ambulate usually without assistance, occasionally cane  Chief Complaint: Diarrhea and bloody stool   HPI: (Please note that patient is not a very good historian and there is no other family members for corroboration)  Adam Butler is a 61 y.o. male with medical history significant of Adam Butler cirrhosis, esophageal varices and diabetes mellitus who was sent over as a direct admission on the request of [facility] after he called reporting that in the past week, he started having frequent stooling, reportedly up to 10 and 15 bowel movements a day. He says that they started getting dark although they have since improved. He also noted that he is been seeing some blood. He had labs done this morning noting a hemoglobin of 14 (around his baseline) and stable vitals. Seen by GI following arrival to floor, they ordered C. difficile cultures which were negative. The plan for colonoscopy tomorrow.   Review of Systems: Patient seen after arrival to floor . Pt complains of frequent stooling, with some blood traces. He says stools were dark, but no longer   Pt denies any headaches, vision changes, dysphagia, chest pain, palpitations, shortness of breath, wheeze, cough, abdominal pain, hematuria, dysuria, constipation, focal extremity numbness weakness or pain .  Review of systems are otherwise negative   Past Medical History:  Diagnosis Date  . Arthritis   . Cholelithiasis   . Cirrhosis ([place]) 2011   Cryptogenic, Likely NASH. Family/pt deny EtOH. HCV, HBV, HAV negative. ANA negative. AMA positive. Ascites 12/11  . Coronary artery disease    Inferior MI 12/11; LHC with occluded mid CFX and 80% proximal RCA. EF 55%. He had 3.0 x 28 vision BMS to CFX  .  Diastolic CHF, acute ([place])    Echo 12/11 with ef 50-55% and mild LVH. EF 55% by LV0gram in 12/11  . Esophageal varices ([place]) 2011, 2013   no hx acute variceal bleed  . Hepatic encephalopathy ([place]) 2011, 12/2013  . Hyperlipemia   . Myocardial infarction [facility]) ? 2012  . PFO (patent foramen ovale): Per TEE 04/20/2015 04/20/2015  . Portal hypertension ([place])   . Rectal varices   . S/P coronary artery stent placement 06/2010  . SVT (supraventricular tachycardia) ([place])    1/12: appeared to be an ectopic atrial tachycardia. Required DCCV with hemodynamic instability  . Type II diabetes mellitus ([place]) 2011   Past Surgical History:  Procedure Laterality Date  . APPENDECTOMY    . CARDIAC CATHETERIZATION  07/01/2010   BMS to CFX.  Marland Kitchen CATARACT EXTRACTION W/PHACO Left 04/28/2016   Procedure: CATARACT EXTRACTION PHACO AND INTRAOCULAR LENS PLACEMENT (IOC);  Surgeon: Leandrew Koyanagi, MD;  Location: [facility] ORS;  Service: Ophthalmology;  Laterality: Left;  Lot# [id number] [place]: 05:31.7 AP%: 28.2 CDE: 93.48   . COLONOSCOPY  04/13/2012   Procedure: COLONOSCOPY;  Surgeon: Inda Castle, MD;  [facility] ENDOSCOPY;  Service: Endoscopy;  Laterality: N/A;  . CORONARY ANGIOPLASTY    . CORONARY STENT PLACEMENT  06/30/2010   CFX   Distal        . ESOPHAGEAL BANDING N/A 01/01/2014   Procedure: ESOPHAGEAL BANDING;  Surgeon: Jerene Bears, MD;  Location: [place] ENDOSCOPY;  Service: Endoscopy;  Laterality: N/A;  . ESOPHAGOGASTRODUODENOSCOPY  04/13/2012   Procedure: ESOPHAGOGASTRODUODENOSCOPY (EGD);  Surgeon: Inda Castle, MD;  Location: [facility];  Service: Endoscopy;  Laterality: N/A;  . ESOPHAGOGASTRODUODENOSCOPY N/A 01/01/2014   Procedure: ESOPHAGOGASTRODUODENOSCOPY (EGD);  Surgeon: Jerene Bears, MD;  Location: [facility] ENDOSCOPY;  Service: Endoscopy;  Laterality: N/A;  . ESOPHAGOGASTRODUODENOSCOPY (EGD) WITH PROPOFOL N/A 07/12/2016   Procedure: ESOPHAGOGASTRODUODENOSCOPY (EGD) WITH PROPOFOL;  Surgeon: Jerene Bears,  MD;  [facility] ENDOSCOPY;  Service: Gastroenterology;  Laterality: N/A;  . LEFT HEART CATHETERIZATION WITH CORONARY ANGIOGRAM N/A 10/15/2014   Procedure: LEFT HEART CATHETERIZATION WITH CORONARY ANGIOGRAM;  Surgeon: Peter M Martinique, MD;  Location: [facility] CATH LAB;  Service: Cardiovascular;  Laterality: N/A;  . orif r leg    . TEE WITHOUT CARDIOVERSION N/A 04/20/2015   Procedure: TRANSESOPHAGEAL ECHOCARDIOGRAM (TEE);  Surgeon: Fay Records, MD;  Location: [facility] ENDOSCOPY;  Service: Cardiovascular;  Laterality: N/A;    Social History:  reports that he has been smoking E-cigarettes and Cigarettes.  He has a 9.00 pack-year smoking history. He has never used smokeless tobacco. He reports that he does not drink alcohol or use drugs.  Lives at home with his wife. Usually able to ambulate without assistance, although at times uses a cane   Allergies  Allergen Reactions  . Isosorbide Nitrate Other (See Comments)    Nose bleeds    Family History  Problem Relation Age of Onset  . Heart attack Brother 35       MI  . Diabetes Brother   . Heart attack Father   . Diabetes Father   . COPD Father   . COPD Mother   . Heart attack Brother   . Stroke Neg Hx       Prior to Admission medications   Medication Sig Start Date End Date Taking? Authorizing Provider  aspirin EC 81 MG tablet Take 81 mg by mouth daily.   Yes [provider]  atorvastatin (LIPITOR) 10 MG tablet TAKE 1 TABLET BY MOUTH ONCE DAILY 02/06/17  Yes 38, Coralie Keens, NP  cyanocobalamin 1000 MCG tablet Take 1,000 mcg by mouth daily. Vitamin B12   Yes [provider]  fluticasone (FLONASE) 50 MCG/ACT nasal spray Place 2 sprays into both nostrils 3 (three) times daily as needed for allergies or rhinitis (congestion).   Yes [provider]  furosemide (LASIX) 40 MG tablet Take 1 tablet (40 mg total) by mouth every morning. 05/30/16  Yes Esterwood, Amy S, PA-C  glipiZIDE (GLUCOTROL) 10 MG tablet TAKE ONE TABLET BY MOUTH  TWICE DAILY BEFORE  A  MEAL. 12/19/16  Yes Baity, Coralie Keens, NP  insulin regular (NOVOLIN R,HUMULIN R) 100 units/mL injection Inject 25-80 Units into the skin 3 (three) times daily as needed for high blood sugar (CBG >150).   Yes [provider]  lactulose (CONSTULOSE) 10 GM/15ML solution TAKE 45 ML BY MOUTH THREE TIMES DAILY ON SCHEDULE IF NO BOWEL BY 1 PM TAKE ADDITIONAL DOSE 01/16/17  Yes Pyrtle, Lajuan Lines, MD  Multiple Vitamin (MULTIVITAMIN WITH MINERALS) TABS tablet Take 1 tablet by mouth daily.   Yes [provider]  nadolol (CORGARD) 40 MG tablet Take 1.5 tablets (60 mg) by mouth once daily 01/16/17  Yes Pyrtle, Lajuan Lines, MD  nitroGLYCERIN (NITROSTAT) 0.4 MG SL tablet Place 1 tablet (0.4 mg total) under the tongue every 5 (five) minutes as needed for chest pain (chest pain). 07/19/16  Yes Baity, Coralie Keens, NP  ranitidine (ZANTAC) 150 MG tablet Take 150 mg by mouth See admin instructions. Take 1 tablet (150 mg)  by mouth every morning, may also take 1 tablet at night as needed for acid reflux   Yes [provider]  spironolactone (ALDACTONE) 25 MG tablet Take 0.5 tablets (12.5 mg total) by mouth daily. 05/30/16  Yes Esterwood, Amy S, PA-C    Physical Exam: BP 135/73 (BP Location: Left Arm)   Pulse 63   Temp 98.3 F (36.8 C) (Oral)   Resp 16   Ht 5' 7"  (1.702 m)   Wt 122 kg (268 lb 15.4 oz)   SpO2 97%   BMI 42.13 kg/m   General:  Alert and oriented 2, no acute distress  Eyes: Sclera nonicteric, extraocular movements are intact  ENT: Normocephalic, atraumatic, mucous members are moist  Neck: Supple, no JVD  Cardiovascular: Regular rate and rhythm, E1-E0, 2/6 systolic ejection murmur  Respiratory: Clear to auscultation bilaterally  Abdomen: Soft, obese, nontender, positive bowel sounds  Skin: No skin breaks, tears or lesions  Musculoskeletal: No clubbing or cyanosis, trace pitting edema bilaterally  Psychiatric: Patient is appropriate, no evidence of psychoses    Neurologic: No focal deficits           Labs on Admission:  Basic Metabolic Panel: No results for input(s): NA, K, CL, CO2, GLUCOSE, BUN, CREATININE, CALCIUM, MG, PHOS in the last 168 hours. Liver Function Tests: No results for input(s): AST, ALT, ALKPHOS, BILITOT, PROT, ALBUMIN in the last 168 hours. No results for input(s): LIPASE, AMYLASE in the last 168 hours. No results for input(s): AMMONIA in the last 168 hours. CBC:  Recent Labs Lab 03/21/17 1027  WBC 7.8  NEUTROABS 4.4  HGB 14.0  HCT 41.2  MCV 102.6*  PLT 77.0*   Cardiac Enzymes: No results for input(s): CKTOTAL, CKMB, CKMBINDEX, TROPONINI in the last 168 hours.  BNP (last 3 results)  Recent Labs  03/02/17 1009  BNP 54.0    ProBNP (last 3 results) No results for input(s): PROBNP in the last 8760 hours.  CBG: No results for input(s): GLUCAP in the last 168 hours.  Radiological Exams on Admission: No results found.  EKG: Not done  Assessment/Plan Present on Admission: . DIASTOLIC HEART FAILURE, CHRONIC: Currently euvolemic. Monitor strict input and output closely . Esophageal varices without bleeding ([place]) . Liver cirrhosis secondary to NASH [facility]) with secondary thrombocytopenia: Stable. On lactulose. Given frequent stooling will hold lactulose tonight . Tobacco abuse: Counseled to quit. Patient did not want a nicotine patch . CAD in native artery . Hematochezia/frequent stooling:? If patient is truly having any kind of bleeding. He is on lactulose which could be accounting for his frequent stooling. Repeat hemoglobin this afternoon. Given C. difficile cultures are negative, GI plans to scope tomorrow.  Diabetes mellitus2 with long-term use of insulin: Patient is on large amount of NovoLog insulin 3 times a day. Given nothing by mouth after midnight tonight, we'll just put him on a resistant sliding scale. Adjust once he is able to take by mouth tomorrow  Active Problems:   DIASTOLIC HEART FAILURE,  CHRONIC   Esophageal varices without bleeding (H[facility])   Tobacco abuse   Liver cirrhosis secondary to NASH (H[facility])   CAD in native artery   Type 2 diabetes mellitus (H[facility])   Hematochezia   DVT prophylaxis: SCDs   Code Status: Presumed full code, not sure if patient able to fully grasp conversation we have. In review of previous hospitalizations, he has been a full code for the past 3 years   Family Communication: Left message with wife  Disposition Plan: Potential discharge tomorrow if colonoscopy unrevealing and hemoglobin stable   Consults called: [facility]   Admission status: Given potential transfer discharge tomorrow, please under observation     Annita Brod MD [facility] Pager [phone]  If 7PM-7AM, please contact night-coverage [email or web address] Password [facility]  03/21/2017, 6:05 PM

## 2017-03-21 NOTE — Progress Notes (Addendum)
03/21/2017 Adam Butler [id number] December 28, 1955   HISTORY OF PRESENT ILLNESS:  This is a 61 year old male with NASH cirrhosis and history of rectal varices on colonoscopy 5 years ago.  He is known to Dr. Hilarie Fredrickson. Presents today with complaints of moderate amount of rectal bleeding for the past 3-4 days.  Tells me that he has been passing some blood 15-20 times per day, says that he was up and down all night due to the bleeding.  Has lower abdominal pain as well, which he says began last week.  Is on nadolol 60 mg daily.  Denies feeling weak or dizzy.   Past Medical History:  Diagnosis Date  . Arthritis   . Cholelithiasis   . Cirrhosis ([place]) 2011   Cryptogenic, Likely NASH. Family/pt deny EtOH. HCV, HBV, HAV negative. ANA negative. AMA positive. Ascites 12/11  . Coronary artery disease    Inferior MI 12/11; LHC with occluded mid CFX and 80% proximal RCA. EF 55%. He had 3.0 x 28 vision BMS to CFX  . Diastolic CHF, acute ([place])    Echo 12/11 with ef 50-55% and mild LVH. EF 55% by LV0gram in 12/11  . Esophageal varices ([place]) 2011, 2013   no hx acute variceal bleed  . Hepatic encephalopathy ([place]) 2011, 12/2013  . Hyperlipemia   . Myocardial infarction [facility]) ? 2012  . PFO (patent foramen ovale): Per TEE 04/20/2015 04/20/2015  . Portal hypertension ([place])   . Rectal varices   . S/P coronary artery stent placement 06/2010  . SVT (supraventricular tachycardia) ([place])    1/12: appeared to be an ectopic atrial tachycardia. Required DCCV with hemodynamic instability  . Type II diabetes mellitus ([place]) 2011   Past Surgical History:  Procedure Laterality Date  . APPENDECTOMY    . CARDIAC CATHETERIZATION  07/01/2010   BMS to CFX.  Marland Kitchen CATARACT EXTRACTION W/PHACO Left 04/28/2016   Procedure: CATARACT EXTRACTION PHACO AND INTRAOCULAR LENS PLACEMENT (IOC);  Surgeon: Leandrew Koyanagi, MD;  Location: [facility] ORS;  Service: Ophthalmology;  Laterality: Left;  Lot# [id number] H [place]: 05:31.7 AP%:  28.2 CDE: 93.48   . COLONOSCOPY  04/13/2012   Procedure: COLONOSCOPY;  Surgeon: Inda Castle, MD;  Location: [facility] ENDOSCOPY;  Service: Endoscopy;  Laterality: N/A;  . CORONARY ANGIOPLASTY    . CORONARY STENT PLACEMENT  06/30/2010   CFX   Distal        . ESOPHAGEAL BANDING N/A 01/01/2014   Procedure: ESOPHAGEAL BANDING;  Surgeon: Jerene Bears, MD;  Location: [facility] ENDOSCOPY;  Service: Endoscopy;  Laterality: N/A;  . ESOPHAGOGASTRODUODENOSCOPY  04/13/2012   Procedure: ESOPHAGOGASTRODUODENOSCOPY (EGD);  Surgeon: Inda Castle, MD;  Location: [facility];  Service: Endoscopy;  Laterality: N/A;  . ESOPHAGOGASTRODUODENOSCOPY N/A 01/01/2014   Procedure: ESOPHAGOGASTRODUODENOSCOPY (EGD);  Surgeon: Jerene Bears, MD;  Location: [facility] ENDOSCOPY;  Service: Endoscopy;  Laterality: N/A;  . ESOPHAGOGASTRODUODENOSCOPY (EGD) WITH PROPOFOL N/A 07/12/2016   Procedure: ESOPHAGOGASTRODUODENOSCOPY (EGD) WITH PROPOFOL;  Surgeon: Jerene Bears, MD;  Location: [facility] ENDOSCOPY;  Service: Gastroenterology;  Laterality: N/A;  . LEFT HEART CATHETERIZATION WITH CORONARY ANGIOGRAM N/A 10/15/2014   Procedure: LEFT HEART CATHETERIZATION WITH CORONARY ANGIOGRAM;  Surgeon: Peter M Martinique, MD;  Location: [facility] CATH LAB;  Service: Cardiovascular;  Laterality: N/A;  . orif r leg    . TEE WITHOUT CARDIOVERSION N/A 04/20/2015   Procedure: TRANSESOPHAGEAL ECHOCARDIOGRAM (TEE);  Surgeon: Fay Records, MD;  Location: [facility] ENDOSCOPY;  Service: Cardiovascular;  Laterality: N/A;    reports that he  has been smoking E-cigarettes and Cigarettes.  He has a 9.00 pack-year smoking history. He has never used smokeless tobacco. He reports that he does not drink alcohol or use drugs. family history includes COPD in his father and mother; Diabetes in his brother and father; Heart attack in his brother and father; Heart attack (age of onset: 52) in his brother. Allergies  Allergen Reactions  . Isosorbide Nitrate Other (See Comments)    Nose bleeds       Outpatient Encounter Prescriptions as of 03/21/2017  Medication Sig  . aspirin EC 81 MG tablet Take 81 mg by mouth daily.  Marland Kitchen atorvastatin (LIPITOR) 10 MG tablet TAKE 1 TABLET BY MOUTH ONCE DAILY (Patient taking differently: TAKE 1 TABLET (10 MG) BY MOUTH ONCE DAILY)  . cyanocobalamin 1000 MCG tablet Take 1,000 mcg by mouth daily. Vitamin B12  . fluticasone (FLONASE) 50 MCG/ACT nasal spray Place 2 sprays into both nostrils 3 (three) times daily as needed for allergies or rhinitis (congestion).  . furosemide (LASIX) 40 MG tablet Take 1 tablet (40 mg total) by mouth every morning. (Patient taking differently: Take 40 mg by mouth daily. )  . glipiZIDE (GLUCOTROL) 10 MG tablet TAKE ONE TABLET BY MOUTH TWICE DAILY BEFORE  A  MEAL. (Patient taking differently: TAKE ONE TABLET (10 MG) BY MOUTH TWICE DAILY BEFORE  A  MEAL.)  . insulin regular (NOVOLIN R,HUMULIN R) 100 units/mL injection Inject 25-80 Units into the skin 3 (three) times daily as needed for high blood sugar (CBG >150).  . lactulose (CONSTULOSE) 10 GM/15ML solution TAKE 45 ML BY MOUTH THREE TIMES DAILY ON SCHEDULE IF NO BOWEL BY 1 PM TAKE ADDITIONAL DOSE (Patient taking differently: Take 30 g by mouth See admin instructions. Take 45 mls (30 g) by mouth three times daily on scheduled - if no bowel movement by 1pm take an additional dose)  . Multiple Vitamin (MULTIVITAMIN WITH MINERALS) TABS tablet Take 1 tablet by mouth daily.  . nadolol (CORGARD) 40 MG tablet Take 1.5 tablets (60 mg) by mouth once daily (Patient taking differently: Take 60 mg by mouth daily. )  . nitroGLYCERIN (NITROSTAT) 0.4 MG SL tablet Place 1 tablet (0.4 mg total) under the tongue every 5 (five) minutes as needed for chest pain (chest pain). (Patient taking differently: Place 0.4 mg under the tongue every 5 (five) minutes as needed for chest pain. )  . ranitidine (ZANTAC) 150 MG tablet Take 150 mg by mouth See admin instructions. Take 1 tablet (150 mg) by mouth every morning,  may also take 1 tablet at night as needed for acid reflux  . spironolactone (ALDACTONE) 25 MG tablet Take 0.5 tablets (12.5 mg total) by mouth daily.  . [DISCONTINUED] metFORMIN (GLUCOPHAGE) 1000 MG tablet TAKE 1 TABLET BY MOUTH TWICE DAILY (Patient not taking: Reported on 03/07/2017)  . [DISCONTINUED] sitaGLIPtin (JANUVIA) 100 MG tablet Take 1 tablet (100 mg total) by mouth daily. (Patient not taking: Reported on 03/07/2017)   No facility-administered encounter medications on file as of 03/21/2017.      REVIEW OF SYSTEMS  : All other systems reviewed and negative except where noted in the History of Present Illness.   PHYSICAL EXAM: BP 110/72 (BP Location: Left Arm, Patient Position: Sitting, Cuff Size: Normal)   Pulse 72   Ht 5' 6.5" (1.689 m)   Wt 274 lb (124.3 kg)   SpO2 98%   BMI 43.56 kg/m  General: Well developed white male in no acute distress Head: Normocephalic and atraumatic  Eyes:  Sclerae anicteric, conjunctiva pink. Ears: Normal auditory acuity Lungs: Clear throughout to auscultation; no increased WOB. Heart: Regular rate and rhythm; no M/R/G. Abdomen: Soft, non-distended.  BS present.  Mild lower abdominal TTP. Rectal:  No external abnormalities seen, but some stringy blood seen around anus.  DRE did not reveal any masses.  There was a small amount of blood on the exam glove.  Anoscopy performed and revealed a large clotted area posteriorly in the rectum. Musculoskeletal: Symmetrical with no gross deformities  Skin: No lesions on visible extremities Extremities: No edema  Neurological: Alert oriented x 4, grossly non-focal Psychological:  Alert and cooperative. Normal mood and affect  ASSESSMENT AND PLAN: *61 year old male with NASH cirrhosis and history of rectal varices on colonoscopy 5 years ago. Presents today with complaints of moderate amount of rectal bleeding for the past 3-4 days. Exam performed today showed a large clotted area in his rectum, unsure if a  hemorrhoid or a rectal varix. Hgb normal and hemodynamically stable, but due to coagulopathy and thrombocytopenia we think that this should be evaluated and observed as an inpatient. Patient is being admitted by the [facility]. Our team has been notified and will see the patient with plans for at least flexible sigmoidoscopy, possible full colonoscopy.  CC:  [name], NP   Addendum: Reviewed and agree with initial management, discussed with Dr. Loletha Carrow as well Will exclude c diff colitis given diarrhea and recent abx. If neg, then colonoscopy Pyrtle, Lajuan Lines, MD

## 2017-03-22 ENCOUNTER — Observation Stay (HOSPITAL_COMMUNITY): Payer: PPO | Admitting: Certified Registered Nurse Anesthetist

## 2017-03-22 ENCOUNTER — Encounter (HOSPITAL_COMMUNITY): Payer: Self-pay | Admitting: *Deleted

## 2017-03-22 ENCOUNTER — Encounter (HOSPITAL_COMMUNITY)
Admission: AD | Disposition: A | Discharge status: Home / Self Care | Payer: Self-pay | Source: Ambulatory Visit | Attending: Internal Medicine

## 2017-03-22 DIAGNOSIS — Z794 Long term (current) use of insulin: Secondary | ICD-10-CM | POA: Diagnosis not present

## 2017-03-22 DIAGNOSIS — E785 Hyperlipidemia, unspecified: Secondary | ICD-10-CM | POA: Diagnosis present

## 2017-03-22 DIAGNOSIS — E876 Hypokalemia: Secondary | ICD-10-CM | POA: Diagnosis present

## 2017-03-22 DIAGNOSIS — Z23 Encounter for immunization: Secondary | ICD-10-CM | POA: Diagnosis present

## 2017-03-22 DIAGNOSIS — I5032 Chronic diastolic (congestive) heart failure: Secondary | ICD-10-CM | POA: Diagnosis present

## 2017-03-22 DIAGNOSIS — I2510 Atherosclerotic heart disease of native coronary artery without angina pectoris: Secondary | ICD-10-CM | POA: Diagnosis present

## 2017-03-22 DIAGNOSIS — Z79899 Other long term (current) drug therapy: Secondary | ICD-10-CM | POA: Diagnosis not present

## 2017-03-22 DIAGNOSIS — Z7982 Long term (current) use of aspirin: Secondary | ICD-10-CM | POA: Diagnosis not present

## 2017-03-22 DIAGNOSIS — K529 Noninfective gastroenteritis and colitis, unspecified: Secondary | ICD-10-CM | POA: Diagnosis not present

## 2017-03-22 DIAGNOSIS — K6389 Other specified diseases of intestine: Secondary | ICD-10-CM | POA: Diagnosis not present

## 2017-03-22 DIAGNOSIS — F17210 Nicotine dependence, cigarettes, uncomplicated: Secondary | ICD-10-CM | POA: Diagnosis present

## 2017-03-22 DIAGNOSIS — D6959 Other secondary thrombocytopenia: Secondary | ICD-10-CM | POA: Diagnosis present

## 2017-03-22 DIAGNOSIS — I8510 Secondary esophageal varices without bleeding: Secondary | ICD-10-CM | POA: Diagnosis present

## 2017-03-22 DIAGNOSIS — Z6841 Body Mass Index (BMI) 40.0 and over, adult: Secondary | ICD-10-CM | POA: Diagnosis not present

## 2017-03-22 DIAGNOSIS — Z833 Family history of diabetes mellitus: Secondary | ICD-10-CM | POA: Diagnosis not present

## 2017-03-22 DIAGNOSIS — A09 Infectious gastroenteritis and colitis, unspecified: Secondary | ICD-10-CM | POA: Diagnosis present

## 2017-03-22 DIAGNOSIS — K633 Ulcer of intestine: Secondary | ICD-10-CM | POA: Diagnosis present

## 2017-03-22 DIAGNOSIS — R197 Diarrhea, unspecified: Secondary | ICD-10-CM | POA: Diagnosis not present

## 2017-03-22 DIAGNOSIS — D122 Benign neoplasm of ascending colon: Secondary | ICD-10-CM | POA: Diagnosis present

## 2017-03-22 DIAGNOSIS — Z955 Presence of coronary angioplasty implant and graft: Secondary | ICD-10-CM | POA: Diagnosis not present

## 2017-03-22 DIAGNOSIS — E119 Type 2 diabetes mellitus without complications: Secondary | ICD-10-CM | POA: Diagnosis present

## 2017-03-22 DIAGNOSIS — K831 Obstruction of bile duct: Secondary | ICD-10-CM | POA: Diagnosis present

## 2017-03-22 DIAGNOSIS — K7581 Nonalcoholic steatohepatitis (NASH): Secondary | ICD-10-CM | POA: Diagnosis present

## 2017-03-22 DIAGNOSIS — I252 Old myocardial infarction: Secondary | ICD-10-CM | POA: Diagnosis not present

## 2017-03-22 DIAGNOSIS — K921 Melena: Secondary | ICD-10-CM | POA: Diagnosis present

## 2017-03-22 DIAGNOSIS — Z7951 Long term (current) use of inhaled steroids: Secondary | ICD-10-CM | POA: Diagnosis not present

## 2017-03-22 DIAGNOSIS — E871 Hypo-osmolality and hyponatremia: Secondary | ICD-10-CM | POA: Diagnosis present

## 2017-03-22 DIAGNOSIS — K51918 Ulcerative colitis, unspecified with other complication: Secondary | ICD-10-CM | POA: Diagnosis not present

## 2017-03-22 DIAGNOSIS — K7460 Unspecified cirrhosis of liver: Secondary | ICD-10-CM | POA: Diagnosis present

## 2017-03-22 HISTORY — PX: COLONOSCOPY WITH PROPOFOL: SHX5780

## 2017-03-22 LAB — CBC
HEMATOCRIT: 38.2 % — AB (ref 39.0–52.0)
Hemoglobin: 13.3 g/dL (ref 13.0–17.0)
MCH: 33.5 pg (ref 26.0–34.0)
MCHC: 34.8 g/dL (ref 30.0–36.0)
MCV: 96.2 fL (ref 78.0–100.0)
PLATELETS: 66 10*3/uL — AB (ref 150–400)
RBC: 3.97 MIL/uL — ABNORMAL LOW (ref 4.22–5.81)
RDW: 14.1 % (ref 11.5–15.5)
WBC: 7.5 10*3/uL (ref 4.0–10.5)

## 2017-03-22 LAB — BASIC METABOLIC PANEL
Anion gap: 9 (ref 5–15)
BUN: 8 mg/dL (ref 6–20)
CO2: 22 mmol/L (ref 22–32)
Calcium: 8.6 mg/dL — ABNORMAL LOW (ref 8.9–10.3)
Chloride: 105 mmol/L (ref 101–111)
Creatinine, Ser: 0.63 mg/dL (ref 0.61–1.24)
GFR calc Af Amer: >60 mL/min (ref >60)
GFR calc non Af Amer: >60 mL/min (ref >60)
Glucose, Bld: 117 mg/dL — ABNORMAL HIGH (ref 65–99)
Potassium: 3 mmol/L — ABNORMAL LOW (ref 3.5–5.1)
Sodium: 136 mmol/L (ref 135–145)

## 2017-03-22 LAB — GASTROINTESTINAL PANEL BY PCR, STOOL (REPLACES STOOL CULTURE)
ASTROVIRUS: NOT DETECTED
Adenovirus F40/41: NOT DETECTED
CYCLOSPORA CAYETANENSIS: NOT DETECTED
Campylobacter species: NOT DETECTED
Cryptosporidium: NOT DETECTED
ENTAMOEBA HISTOLYTICA: NOT DETECTED
ENTEROTOXIGENIC E COLI (ETEC): NOT DETECTED
Enteroaggregative E coli (EAEC): NOT DETECTED
Enteropathogenic E coli (EPEC): NOT DETECTED
Giardia lamblia: NOT DETECTED
NOROVIRUS GI/GII: NOT DETECTED
PLESIMONAS SHIGELLOIDES: NOT DETECTED
ROTAVIRUS A: NOT DETECTED
SAPOVIRUS (I, II, IV, AND V): NOT DETECTED
SHIGA LIKE TOXIN PRODUCING E COLI (STEC): NOT DETECTED
Salmonella species: NOT DETECTED
Shigella/Enteroinvasive E coli (EIEC): NOT DETECTED
Vibrio cholerae: NOT DETECTED
Vibrio species: NOT DETECTED
Yersinia enterocolitica: NOT DETECTED

## 2017-03-22 LAB — GLUCOSE, CAPILLARY
GLUCOSE-CAPILLARY: 208 mg/dL — AB (ref 65–99)
Glucose-Capillary: 123 mg/dL — ABNORMAL HIGH (ref 65–99)
Glucose-Capillary: 152 mg/dL — ABNORMAL HIGH (ref 65–99)
Glucose-Capillary: 226 mg/dL — ABNORMAL HIGH (ref 65–99)

## 2017-03-22 SURGERY — COLONOSCOPY WITH PROPOFOL
Anesthesia: Monitor Anesthesia Care

## 2017-03-22 MED ORDER — LACTATED RINGERS IV SOLN
INTRAVENOUS | Status: AC | PRN
  Administered 2017-03-22: 1000 mL via INTRAVENOUS

## 2017-03-22 MED ORDER — INFLUENZA VAC SPLIT QUAD 0.5 ML IM SUSY
0.5000 mL | PREFILLED_SYRINGE | INTRAMUSCULAR | Status: AC
  Administered 2017-03-23: 0.5 mL via INTRAMUSCULAR
  Filled 2017-03-22 (×2): qty 0.5

## 2017-03-22 MED ORDER — PROPOFOL 10 MG/ML IV BOLUS
INTRAVENOUS | Status: DC | PRN
  Administered 2017-03-22: 10 mg via INTRAVENOUS

## 2017-03-22 MED ORDER — LIDOCAINE 2% (20 MG/ML) 5 ML SYRINGE
INTRAMUSCULAR | Status: AC
  Filled 2017-03-22: qty 5

## 2017-03-22 MED ORDER — PROPOFOL 10 MG/ML IV BOLUS
INTRAVENOUS | Status: AC
  Filled 2017-03-22: qty 40

## 2017-03-22 MED ORDER — SODIUM CHLORIDE 0.9 % IV SOLN
INTRAVENOUS | Status: DC
  Administered 2017-03-22: 500 mL via INTRAVENOUS

## 2017-03-22 MED ORDER — PROPOFOL 10 MG/ML IV BOLUS
INTRAVENOUS | Status: AC
  Filled 2017-03-22: qty 20

## 2017-03-22 MED ORDER — POTASSIUM CHLORIDE 10 MEQ/100ML IV SOLN
10.0000 meq | INTRAVENOUS | Status: DC
  Administered 2017-03-22 (×3): 10 meq via INTRAVENOUS
  Filled 2017-03-22 (×6): qty 100

## 2017-03-22 MED ORDER — POTASSIUM CHLORIDE CRYS ER 20 MEQ PO TBCR
40.0000 meq | EXTENDED_RELEASE_TABLET | Freq: Once | ORAL | Status: AC
  Administered 2017-03-22: 40 meq via ORAL
  Filled 2017-03-22: qty 2

## 2017-03-22 MED ORDER — VANCOMYCIN 50 MG/ML ORAL SOLUTION
125.0000 mg | Freq: Four times a day (QID) | ORAL | Status: DC
  Administered 2017-03-22 – 2017-03-23 (×5): 125 mg via ORAL
  Filled 2017-03-22 (×6): qty 2.5

## 2017-03-22 MED ORDER — ONDANSETRON HCL 4 MG/2ML IJ SOLN
INTRAMUSCULAR | Status: AC
  Filled 2017-03-22: qty 2

## 2017-03-22 MED ORDER — PROPOFOL 500 MG/50ML IV EMUL
INTRAVENOUS | Status: DC | PRN
  Administered 2017-03-22: 100 ug/kg/min via INTRAVENOUS

## 2017-03-22 MED ORDER — SODIUM CHLORIDE 0.9 % IV SOLN: INTRAVENOUS | Status: DC

## 2017-03-22 SURGICAL SUPPLY — 22 items

## 2017-03-22 NOTE — Op Note (Signed)
[facility] Patient Name: Adam Butler Procedure Date: 03/22/2017 MRN: [id number] Attending MD: Estill Cotta. Loletha Carrow , MD Date of Birth: [birth year] CSN: [id number] Age: 61 Admit Type: Outpatient Procedure:                Colonoscopy Indications:              Generalized abdominal pain, Diarrhea of presumed                            infectious origin, Hematochezia Providers:                Estill Cotta. Loletha Carrow, MD, Laverta [name] RN, RN, Marcene Duos, Technician Referring MD:              Medicines:                Monitored Anesthesia Care Complications:            No immediate complications. Estimated Blood Loss:     Estimated blood loss was minimal. Procedure:                Pre-Anesthesia Assessment:                           - Prior to the procedure, a History and Physical                            was performed, and patient medications and                            allergies were reviewed. The patient's tolerance of                            previous anesthesia was also reviewed. The risks                            and benefits of the procedure and the sedation                            options and risks were discussed with the patient.                            All questions were answered, and informed consent                            was obtained. Prior Anticoagulants: The patient has                            taken no previous anticoagulant or antiplatelet                            agents. ASA Grade Assessment: III - A patient with  severe systemic disease. After reviewing the risks                            and benefits, the patient was deemed in                            satisfactory condition to undergo the procedure.                           After obtaining informed consent, the colonoscope                            was passed under direct vision. Throughout the   procedure, the patient's blood pressure, pulse, and                            oxygen saturations were monitored continuously. The                            EC-[id number] ([id number]) scope was introduced through                            the anus and advanced to the the terminal ileum.                            The colonoscopy was performed without difficulty.                            The patient tolerated the procedure well. The                            quality of the bowel preparation was good. The                            terminal ileum, ileocecal valve, appendiceal                            orifice, and rectum were photographed. The bowel                            preparation used was MoviPrep. Scope In: 11:26:45 AM Scope Out: 11:45:35 AM Scope Withdrawal Time: 0 hours 8 minutes 7 seconds  Total Procedure Duration: 0 hours 18 minutes 50 seconds  Findings:      The perianal and digital rectal examinations were normal.      The terminal ileum appeared normal.      The colon (entire examined portion) was moderately redundant.      Diverticula were found in the left colon.      A 10 mm polyp was found in the proximal ascending colon. The polyp was       sessile. Purposely not removed due to thrombocytopenia and so as not to       prolong procedure during acute illness with active colitis.      Patchy moderate inflammation characterized by congestion (edema),       erythema, loss of vascularity  and shallow ulcerations was found in the       entire colon. Several biopsies were obtained in the distal sigmoid       colon, in the transverse colon and in the cecum with cold forceps for       histology.      Non-bleeding rectal varices were found.      Internal hemorrhoids were found. The hemorrhoids were medium-sized and       Grade I (internal hemorrhoids that do not prolapse).      Retroflexion in the rectum was not performed due to the presence of       large varices. Impression:                - The examined portion of the ileum was normal.                           - Redundant colon.                           - Diverticulosis in the left colon.                           - One 10 mm polyp in the proximal ascending colon.                            Not removed (see above)                           - Patchy moderate inflammation was found in the                            entire examined colon secondary to suspected                            infectious colitis.                           - Rectal varices. No stigmata of bleeding.                           - Internal hemorrhoids. No stigmata of bleeding.                           - Several biopsies were obtained in the distal                            sigmoid colon, in the transverse colon and in the                            cecum.                           The colon ulcers appear to be the source of                            bleeding rather than the rectal varices or  hemorrhoids. Moderate Sedation:      MAC sedation used Recommendation:           - Return patient to hospital ward for ongoing care.                           - Resume regular diet.                           - Await pathology results.                           - Repeat colonoscopy is recommended. The                            colonoscopy date will be determined after pathology                            results from today's exam become available for                            review.                           Await results of GI pathogen panel.                           Despite negative C difficile testing, the clinical                            picture is still worrisome for that possibility in                            a cirrhotic (immunocompromised) patient with recent                            hospitalization and antibiotic treatment. Empiric                            vancomycin will be started.                            - Continue present medications. Procedure Code(s):        --- Professional ---                           [phone], Colonoscopy, flexible; with biopsy, single                            or multiple Diagnosis Code(s):        --- Professional ---                           K64.0, First degree hemorrhoids                           D12.2, Benign neoplasm of ascending colon  A09, Infectious gastroenteritis and colitis,                            unspecified                           R10.84, Generalized abdominal pain                           R19.7, Diarrhea, unspecified                           K92.1, Melena (includes Hematochezia)                           K57.30, Diverticulosis of large intestine without                            perforation or abscess without bleeding                           Q43.8, Other specified congenital malformations of                            intestine CPT copyright 2016 American Medical Association. All rights reserved. The codes documented in this report are preliminary and upon coder review may  be revised to meet current compliance requirements. Chayanne Filippi L. Loletha Carrow, MD 03/22/2017 12:04:06 PM This report has been signed electronically. Number of Addenda: 0

## 2017-03-22 NOTE — Transfer of Care (Signed)
Immediate Anesthesia Transfer of Care Note  Patient: Adam Butler  Procedure(s) Performed: Procedure(s): COLONOSCOPY WITH PROPOFOL (N/A)  Patient Location: PACU  Anesthesia Type:MAC  Level of Consciousness:  sedated, patient cooperative and responds to stimulation  Airway & Oxygen Therapy:Patient Spontanous Breathing and Patient connected to face mask oxgen  Post-op Assessment:  Report given to PACU RN and Post -op Vital signs reviewed and stable  Post vital signs:  Reviewed and stable  Last Vitals:  Vitals:   03/22/17 0542 03/22/17 1034  BP: (!) 121/57 (!) 122/58  Pulse: 68   Resp: 20 18  Temp: 36.5 C 36.5 C  SpO2: 88% 32%    Complications: No apparent anesthesia complications

## 2017-03-22 NOTE — Progress Notes (Signed)
Initial Nutrition Assessment  DOCUMENTATION CODES:   Morbid obesity  INTERVENTION:   Will monitor PO intakes for need of supplements.  NUTRITION DIAGNOSIS:   Inadequate oral intake related to poor appetite as evidenced by per patient/family report.  GOAL:   Patient will meet greater than or equal to 90% of their needs  MONITOR:   PO intake, Labs, Weight trends, I & O's, Skin  REASON FOR ASSESSMENT:   Malnutrition Screening Tool    ASSESSMENT:    61 y.o. male with medical history significant of NASH cirrhosis, esophageal varices and diabetes mellitus who was sent over as a direct admission on the request of [facility] after patient was called reporting that in the past week, he started having frequent stooling, reportedly up to 10 and 15 bowel movements a day. He says that they started getting dark. Seen by GI following arrival to floor, they ordered C. difficile cultures which were negative. He underwent colonoscopy on 9/12.  Patient s/p colonoscopy today: found inflammation in colon, diverticulosis of large intestine, internal hemorrhoids, rectal varices per chart review. Pt was NPO, now on CHO modified diet. Pt eager to eat. Will monitor PO intakes for needs of supplements. Pt is morbidly obese.   Per chart, pt with weight fluctuations over the past year. Pt with CHF, could be fluid related. Nutrition focused physical exam shows no sign of depletion of muscle mass or body fat.  Medications: Vitamin B-12 tablet daily Labs reviewed: CBGs: 123-152 Low K  Diet Order:  Diet Carb Modified Fluid consistency: Thin; Room service appropriate? Yes  Skin:   (Lt ankle)  Last BM:  9/12  Height:   Ht Readings from Last 1 Encounters:  03/22/17 5' 7"  (1.702 m)    Weight:   Wt Readings from Last 1 Encounters:  03/22/17 268 lb (121.6 kg)    Ideal Body Weight:  67.2 kg  BMI:  Body mass index is 41.97 kg/m.  Estimated Nutritional Needs:   Kcal:  2000-2200  Protein:   80-90g  Fluid:  2L/day  EDUCATION NEEDS:   No education needs identified at this time  Clayton Bibles, MS, RD, LDN Pager: [phone] After Hours Pager: [phone]

## 2017-03-22 NOTE — Anesthesia Postprocedure Evaluation (Signed)
Anesthesia Post Note  Patient: Adam Butler  Procedure(s) Performed: Procedure(s) (LRB): COLONOSCOPY WITH PROPOFOL (N/A)     Patient location during evaluation: PACU Anesthesia Type: MAC Level of consciousness: awake and alert Pain management: pain level controlled Vital Signs Assessment: post-procedure vital signs reviewed and stable Respiratory status: spontaneous breathing, nonlabored ventilation, respiratory function stable and patient connected to nasal cannula oxygen Cardiovascular status: stable and blood pressure returned to baseline Anesthetic complications: no    Last Vitals:  Vitals:   03/22/17 1158 03/22/17 1210  BP: (!) 109/55   Pulse: 68 67  Resp: 16 (!) 22  Temp:    SpO2: 100% 98%    Last Pain:  Vitals:   03/22/17 1120  TempSrc:   PainSc: 0-No pain                 Taneshia Lorence EDWARD

## 2017-03-22 NOTE — H&P (View-Only) (Signed)
Consultation  Referring Provider:   Dr. [name]   Primary Care Physician:  [name], NP Primary Gastroenterologist:   Dr. Hilarie Fredrickson      Reason for Consultation: Hematochezia, Diarrhea, NASH cirrhosis             HPI:   Adam Butler is a 61 y.o. male with past medical history of NASH cirrhosis complicated by portal htn and rectal varices, CAD and diastolic CHF (LVEF 7/78/24 55%) as well as others listed below who presents to the hospital today from our outpatient clinic where he was seen earlier due to a complaint of 4-5 days of rectal bleeding.     Please see outpatient note for further details.    At time of interview today, the patient is noted to be a very poor historian. He explains that he normally takes lactulose 3 times a day and typically has a couple of soft formed stools in the morning, but over the past week or so he has had an increase in the amount of loose stools he experiences. He has been having at least 15-20 loose stools per day and some of these are accompanied by bright red blood. The patient tells me that typically the stools that wake him up at night have more blood in them then the ones during the day. Associated symptoms include lower abdominal cramping which started after his initial increase in stool output. This is may be "slightly better" after he has a bowel movement but tends to build back up again. Patient has continued his lactulose 3 times a day at home. Overall, the patient just explains that "I don't feel well". Apparently he lost his appetite about a week ago and has not been having much to eat. He denies any dizziness or shortness of breath.   Patient denies fever, chills, weight loss, nausea, vomiting, heartburn or reflux.  Previous GI History: 07/12/16-EGD, Dr. Hilarie Fredrickson: non-bleeding >63m esophageal varices, mild portal hypertensive gastropathy, gastroparesis 04/13/12-Colonoscopy, Dr. KDeatra Ina portal hypertensive colopathy, rectal varices, friable  colonic mucosa  Past Medical History:  Diagnosis Date  . Arthritis   . Cholelithiasis   . Cirrhosis ([facility] 2011   Cryptogenic, Likely NASH. Family/pt deny EtOH. HCV, HBV, HAV negative. ANA negative. AMA positive. Ascites 12/11  . Coronary artery disease    Inferior MI 12/11; LHC with occluded mid CFX and 80% proximal RCA. EF 55%. He had 3.0 x 28 vision BMS to CFX  . Diastolic CHF, acute ([place]    Echo 12/11 with ef 50-55% and mild LVH. EF 55% by LV0gram in 12/11  . Esophageal varices ([facility] 2011, 2013   no hx acute variceal bleed  . Hepatic encephalopathy ([facility] 2011, 12/2013  . Hyperlipemia   . Myocardial infarction ([facility] ? 2012  . PFO (patent foramen ovale): Per TEE 04/20/2015 04/20/2015  . Portal hypertension ([facility]   . Rectal varices   . S/P coronary artery stent placement 06/2010  . SVT (supraventricular tachycardia) ([facility]    1/12: appeared to be an ectopic atrial tachycardia. Required DCCV with hemodynamic instability  . Type II diabetes mellitus ([facility] 2011    Past Surgical History:  Procedure Laterality Date  . APPENDECTOMY    . CARDIAC CATHETERIZATION  07/01/2010   BMS to CFX.  .Marland KitchenCATARACT EXTRACTION W/PHACO Left 04/28/2016   Procedure: CATARACT EXTRACTION PHACO AND INTRAOCULAR LENS PLACEMENT (IOC);  Surgeon: CLeandrew Koyanagi MD;  Location: [facility] ORS;  Service: Ophthalmology;  Laterality: Left;  Lot# [id number] [place] 05:31.7  AP%: 28.2 CDE: 93.48   . COLONOSCOPY  04/13/2012   Procedure: COLONOSCOPY;  Surgeon: Inda Castle, MD;  Location: [facility] ENDOSCOPY;  Service: Endoscopy;  Laterality: N/A;  . CORONARY ANGIOPLASTY    . CORONARY STENT PLACEMENT  06/30/2010   CFX   Distal        . ESOPHAGEAL BANDING N/A 01/01/2014   Procedure: ESOPHAGEAL BANDING;  Surgeon: Jerene Bears, MD;  Location: [facility] ENDOSCOPY;  Service: Endoscopy;  Laterality: N/A;  . ESOPHAGOGASTRODUODENOSCOPY  04/13/2012   Procedure: ESOPHAGOGASTRODUODENOSCOPY (EGD);  Surgeon: Inda Castle, MD;  Location: [facility];  Service: Endoscopy;  Laterality: N/A;  . ESOPHAGOGASTRODUODENOSCOPY N/A 01/01/2014   Procedure: ESOPHAGOGASTRODUODENOSCOPY (EGD);  Surgeon: Jerene Bears, MD;  Location: [facility] ENDOSCOPY;  Service: Endoscopy;  Laterality: N/A;  . ESOPHAGOGASTRODUODENOSCOPY (EGD) WITH PROPOFOL N/A 07/12/2016   Procedure: ESOPHAGOGASTRODUODENOSCOPY (EGD) WITH PROPOFOL;  Surgeon: Jerene Bears, MD;  Location: [facility] ENDOSCOPY;  Service: Gastroenterology;  Laterality: N/A;  . LEFT HEART CATHETERIZATION WITH CORONARY ANGIOGRAM N/A 10/15/2014   Procedure: LEFT HEART CATHETERIZATION WITH CORONARY ANGIOGRAM;  Surgeon: Peter M Martinique, MD;  Location: [facility] CATH LAB;  Service: Cardiovascular;  Laterality: N/A;  . orif r leg    . TEE WITHOUT CARDIOVERSION N/A 04/20/2015   Procedure: TRANSESOPHAGEAL ECHOCARDIOGRAM (TEE);  Surgeon: Fay Records, MD;  Location: [facility] ENDOSCOPY;  Service: Cardiovascular;  Laterality: N/A;    Family History  Problem Relation Age of Onset  . Heart attack Brother 89       MI  . Diabetes Brother   . Heart attack Father   . Diabetes Father   . COPD Father   . COPD Mother   . Heart attack Brother   . Stroke Neg Hx     Social History  Substance Use Topics  . Smoking status: Current Every Day Smoker    Packs/day: 0.25    Years: 36.00    Types: E-cigarettes, Cigarettes  . Smokeless tobacco: Never Used     Comment: uses vapor cigarettes (2016 ); I smoke about 4 cigarettes a day  . Alcohol use No    Prior to Admission medications   Medication Sig Start Date End Date Taking? Authorizing Provider  aspirin EC 81 MG tablet Take 81 mg by mouth daily.   Yes [provider]  atorvastatin (LIPITOR) 10 MG tablet TAKE 1 TABLET BY MOUTH ONCE DAILY 02/06/17  Yes 25, Coralie Keens, NP  cyanocobalamin 1000 MCG tablet Take 1,000 mcg by mouth daily. Vitamin B12   Yes [provider]  fluticasone (FLONASE) 50 MCG/ACT nasal spray Place 2 sprays into both nostrils 3 (three) times daily as needed  for allergies or rhinitis (congestion).   Yes [provider]  furosemide (LASIX) 40 MG tablet Take 1 tablet (40 mg total) by mouth every morning. 05/30/16  Yes Esterwood, Amy S, PA-C  glipiZIDE (GLUCOTROL) 10 MG tablet TAKE ONE TABLET BY MOUTH TWICE DAILY BEFORE  A  MEAL. 12/19/16  Yes Baity, Coralie Keens, NP  insulin regular (NOVOLIN R,HUMULIN R) 100 units/mL injection Inject 25-80 Units into the skin 3 (three) times daily as needed for high blood sugar (CBG >150).   Yes [provider]  lactulose (CONSTULOSE) 10 GM/15ML solution TAKE 45 ML BY MOUTH THREE TIMES DAILY ON SCHEDULE IF NO BOWEL BY 1 PM TAKE ADDITIONAL DOSE 01/16/17  Yes Pyrtle, Lajuan Lines, MD  Multiple Vitamin (MULTIVITAMIN WITH MINERALS) TABS tablet Take 1 tablet by mouth daily.   Yes [provider]  nadolol (CORGARD) 40 MG tablet Take 1.5 tablets (60 mg) by mouth once daily 01/16/17  Yes Pyrtle, Lajuan Lines, MD  nitroGLYCERIN (NITROSTAT) 0.4 MG SL tablet Place 1 tablet (0.4 mg total) under the tongue every 5 (five) minutes as needed for chest pain (chest pain). 07/19/16  Yes Baity, Coralie Keens, NP  ranitidine (ZANTAC) 150 MG tablet Take 150 mg by mouth See admin instructions. Take 1 tablet (150 mg) by mouth every morning, may also take 1 tablet at night as needed for acid reflux   Yes [provider]  spironolactone (ALDACTONE) 25 MG tablet Take 0.5 tablets (12.5 mg total) by mouth daily. 05/30/16  Yes Esterwood, Amy S, PA-C    No current facility-administered medications for this encounter.     Allergies as of 03/21/2017 - Review Complete 03/21/2017  Allergen Reaction Noted  . Isosorbide nitrate Other (See Comments) 02/21/2015     Review of Systems:    Constitutional: No weight loss, fever or chills Skin: No rash  Cardiovascular: No chest pain Respiratory: No SOB Gastrointestinal: See HPI and otherwise negative Genitourinary: No dysuria Neurological: No headache, dizziness or syncope Musculoskeletal: No new  muscle or joint pain Hematologic: No bruising Psychiatric: No history of depression or anxiety   Physical Exam:  Vital signs in last 24 hours: Temp:  [98.3 F (36.8 C)] 98.3 F (36.8 C) (09/11 1416) Pulse Rate:  [63-72] 63 (09/11 1416) Resp:  [16] 16 (09/11 1416) BP: (110-135)/(72-73) 135/73 (09/11 1416) SpO2:  [97 %-98 %] 97 % (09/11 1416) Weight:  [274 lb (124.3 kg)] 274 lb (124.3 kg) (09/11 0831)   General:   Obese Caucasian male appears to be in mild distress, Well developed, Well nourished, alert and cooperative Head:  Normocephalic and atraumatic. Eyes:   PEERL, EOMI. No icterus. Conjunctiva pink. Ears:  Normal auditory acuity. Neck:  Supple Throat: Oral cavity and pharynx without inflammation, swelling or lesion. Poor dentition Lungs: Respirations even and unlabored. Lungs clear to auscultation bilaterally.   No wheezes, crackles, or rhonchi.  Heart: Normal S1, S2. No MRG. Regular rate and rhythm. No peripheral edema, cyanosis or pallor.  Abdomen:  Soft, nondistended, mild b/l lower abdominal ttp. No rebound or guarding. Normal bowel sounds. No appreciable masses or hepatomegaly. Rectal:  Not performed.  Msk:  Symmetrical without gross deformities.  Extremities:  Without edema, no deformity or joint abnormality. Neurologic:  Alert and  oriented x4;  grossly normal neurologically.  Skin:   Dry and intact without significant lesions or rashes. Psychiatric: Demonstrates good judgement and reason without abnormal affect or behaviors.  LAB RESULTS:  Recent Labs  03/21/17 1027  WBC 7.8  HGB 14.0  HCT 41.2  PLT 77.0*    PREVIOUS ENDOSCOPIES:            See HPI   Impression / Plan:   Impression: 1. Rectal Bleeding: h/o rectal varices, now with rectal bleeding x 4-5d, 15-20 stools per day per pt, has continued Lactulose TID over this time, likely contributing; Concern for rectal varices vs hemorrhoids vs other 2. NASH Cirrhosis 3. Diarrhea: Increase in frequency of  stools, lactulose likely contributing; Consider infectious cause vs other  Plan: 1. Per Dr. Coralie Carpen, PA-C- pt would benefit from colonoscopy for further eval of rectal bleeding, especially with h/o rectal varices in the past. We will arrange. Discussed risks, benefits, limitations and alternatives and the patient agrees to proceed. (May hold on this if Cdiff positive) 2. Patient to remain on clear liquid diet today  and NPO after midnight 3. Currently with increase in stools and abdominal pain suspect C.Diff, certainly at increased risk after recent hospitalization and abx-ordered stool studies below. 4. Hold Lactulose 5. Ordered stool studies including Gi pathogen panel and C Diff- If positive, will hole on colonoscopy for now, if negative will proceed tomorrow as planned-prep will be ordered tonight if results negative. 6. Please await final recommendations from Dr. Loletha Carrow  Thank you for your kind consultation, we will continue to follow.  Adam Butler  03/21/2017, 3:32 PM Pager #: [phone]  I have reviewed the entire case in detail with the above APP and discussed the plan in detail.  Therefore, I agree with the diagnoses recorded above. In addition,  I have personally interviewed and examined the patient and have personally reviewed any abdominal/pelvic CT scan images.  My additional thoughts are as follows: Add'l Dx:  Non-bleeding esophageal varices, internal hemorrhoids, rectal varices, hepatic encephalopathy, portal hypertensive ascites, CAD, CHF  I am suspicious of C difficile due to recent hospitalization and antibiotic treatment for group B strep sepsis (ID felt it was from skin source, according to d/c summary). He does not appear to be having a brisk GI bleed , and I suspect his known large internal hemorrhoids have been exacerbated by intractable diarrhea.  I will speak with Dr. Hilarie Fredrickson, who is on call tonight.  If C diff testing returns positive later this  evening, then vancomycin will be started and the colonoscopy canceled.  If testing negative, then he will order a bowel preparation to be taken tonight for colonoscopy tomorrow.  Monitor twice daily CBC.  Colonoscopy would be an increased risk procedure due to his medical co-morbidities noted above.  [name] Pager [phone]  Mon-Fri 8a-5p [phone] after 5p, weekends, holidays

## 2017-03-22 NOTE — Progress Notes (Addendum)
PROGRESS NOTE    TAJAE RYBICKI  NHA:[id number] DOB: 10-26-1955 DOA: 03/21/2017 PCP: [name], NP     Brief Narrative:  Adam Butler is a 61 y.o. male with medical history significant of NASH cirrhosis, esophageal varices and diabetes mellitus who was sent over as a direct admission on the request of [facility] after patient was called reporting that in the past week, he started having frequent stooling, reportedly up to 10 and 15 bowel movements a day. He says that they started getting dark. Seen by GI following arrival to floor, they ordered C. difficile cultures which were negative. He underwent colonoscopy on 9/12.  Assessment & Plan:   Active Problems:   DIASTOLIC HEART FAILURE, CHRONIC   Esophageal varices without bleeding (HCC)   Tobacco abuse   Liver cirrhosis secondary to NASH (HCC)   CAD in native artery   Type 2 diabetes mellitus (HCC)   Hematochezia   Diarrhea, concern for infectious colitis, with rectal bleeding  -GI consulted -Colonoscopy 9/12 - moderate inflammation in colon, suspected infectious colitis, with rectal varices, internal hemorrhoids, biopsy pathology pending  -C Diff negative, but immunosuppressed and high suspicion. Vanco started by GI -GI PCR pending  -Hold lactulose   Chronic diastolic heart failure -Euvolemic,stable  NASH cirrhosis with esophageal varices -Hold lactulose, continue nadolol   DM type 2 -SSI   HLD -Continue lipitor    DVT prophylaxis: SCD Code Status: Full Family Communication: Wife over the phone today Disposition Plan: Pending improvement   Consultants:   GI  Procedures:   Colonoscopy 9/12   Antimicrobials:  Anti-infectives    Start     Dose/Rate Route Frequency Ordered Stop   03/22/17 1400  vancomycin (VANCOCIN) 50 mg/mL oral solution 125 mg     125 mg Oral Every 6 hours 03/22/17 1208         Subjective: Feeling well this morning, awaiting colonoscopy. Wondering when he can eat. Bleeding  has slowed down. Denies abdominal pain, nausea, vomiting.   Objective: Vitals:   03/22/17 1200 03/22/17 1210 03/22/17 1220 03/22/17 1248  BP:  (!) 181/64 (!) 178/78 (!) 110/54  Pulse: 67 67 65 65  Resp: (!) 24 (!) 22 16 18   Temp:    (!) 97.4 F (36.3 C)  TempSrc:    Oral  SpO2: 100% 98% 96% 98%  Weight:      Height:        Intake/Output Summary (Last 24 hours) at 03/22/17 1358 Last data filed at 03/22/17 1243  Gross per 24 hour  Intake             1180 ml  Output                0 ml  Net             1180 ml   Filed Weights   03/21/17 1416 03/22/17 1034  Weight: 122 kg (268 lb 15.4 oz) 121.6 kg (268 lb)    Examination:  General exam: Appears calm and comfortable  Respiratory system: Clear to auscultation. Respiratory effort normal. Cardiovascular system: S1 & S2 heard. No JVD, murmurs, rubs, gallops or clicks. No pedal edema. Gastrointestinal system: Abdomen is nondistended, obese, soft and nontender. No organomegaly or masses felt. Normal bowel sounds heard. Central nervous system: Alert and oriented. No focal neurological deficits. Extremities: Symmetric 5 x 5 power. Skin: No rashes, lesions or ulcers Psychiatry: Judgement and insight appear normal. Mood & affect appropriate.   Data Reviewed:  I have personally reviewed following labs and imaging studies  CBC:  Recent Labs Lab 03/21/17 1027 03/21/17 1930 03/22/17 0405  WBC 7.8 7.7 7.5  NEUTROABS 4.4  --   --   HGB 14.0 13.5 13.3  HCT 41.2 38.9* 38.2*  MCV 102.6* 98.5 96.2  PLT 77.0* 75* 66*   Basic Metabolic Panel:  Recent Labs Lab 03/21/17 1930 03/22/17 0405  NA 131* 136  K 3.3* 3.0*  CL 101 105  CO2 24 22  GLUCOSE 308* 117*  BUN 8 8  CREATININE 0.82 0.63  CALCIUM 8.5* 8.6*   GFR: Estimated Creatinine Clearance: 121.1 mL/min (by C-G formula based on SCr of 0.63 mg/dL). Liver Function Tests: No results for input(s): AST, ALT, ALKPHOS, BILITOT, PROT, ALBUMIN in the last 168 hours. No results for  input(s): LIPASE, AMYLASE in the last 168 hours. No results for input(s): AMMONIA in the last 168 hours. Coagulation Profile: No results for input(s): INR, PROTIME in the last 168 hours. Cardiac Enzymes: No results for input(s): CKTOTAL, CKMB, CKMBINDEX, TROPONINI in the last 168 hours. BNP (last 3 results) No results for input(s): PROBNP in the last 8760 hours. HbA1C: No results for input(s): HGBA1C in the last 72 hours. CBG:  Recent Labs Lab 03/21/17 2125 03/22/17 0846 03/22/17 1308  GLUCAP 204* 123* 152*   Lipid Profile: No results for input(s): CHOL, HDL, LDLCALC, TRIG, CHOLHDL, LDLDIRECT in the last 72 hours. Thyroid Function Tests: No results for input(s): TSH, T4TOTAL, FREET4, T3FREE, THYROIDAB in the last 72 hours. Anemia Panel: No results for input(s): VITAMINB12, FOLATE, FERRITIN, TIBC, IRON, RETICCTPCT in the last 72 hours. Sepsis Labs: No results for input(s): PROCALCITON, LATICACIDVEN in the last 168 hours.  Recent Results (from the past 240 hour(s))  C difficile quick scan w PCR reflex     Status: None   Collection Time: 03/21/17  4:14 PM  Result Value Ref Range Status   C Diff antigen NEGATIVE NEGATIVE Final   C Diff toxin NEGATIVE NEGATIVE Final   C Diff interpretation No C. difficile detected.  Final       Radiology Studies: No results found.    Scheduled Meds: . atorvastatin  10 mg Oral Daily  . [START ON 03/23/2017] Influenza vac split quadrivalent PF  0.5 mL Intramuscular Tomorrow-1000  . insulin aspart  0-20 Units Subcutaneous TID WC  . insulin aspart  0-5 Units Subcutaneous QHS  . nadolol  60 mg Oral Daily  . vancomycin  125 mg Oral Q6H  . cyanocobalamin  1,000 mcg Oral Daily   Continuous Infusions:   LOS: 1 day    Time spent: 40 minutes   Dessa Phi, DO [facility] [email or web address] Password [email or web address] 03/22/2017, 1:58 PM

## 2017-03-22 NOTE — Interval H&P Note (Signed)
History and Physical Interval Note:  03/22/2017 11:08 AM  Adam Butler  has presented today for surgery, with the diagnosis of Hematochezia  The various methods of treatment have been discussed with the patient and family. After consideration of risks, benefits and other options for treatment, the patient has consented to  Procedure(s): COLONOSCOPY WITH PROPOFOL (N/A) as a surgical intervention .  The patient's history has been reviewed, patient examined, no change in status, stable for surgery.  I have reviewed the patient's chart and labs.  Questions were answered to the patient's satisfaction.     [name]

## 2017-03-22 NOTE — Anesthesia Preprocedure Evaluation (Addendum)
Anesthesia Evaluation  Patient identified by MRN, date of birth, ID band Patient awake    Reviewed: Allergy & Precautions, H&P , NPO status , Patient's Chart, lab work & pertinent test results, reviewed documented beta blocker date and time   Airway Mallampati: II  TM Distance: >3 FB Neck ROM: full    Dental no notable dental hx. (+) Poor Dentition   Pulmonary shortness of breath, Current Smoker,    Pulmonary exam normal breath sounds clear to auscultation       Cardiovascular hypertension, Pt. on medications and Pt. on home beta blockers + CAD, + Past MI, + Cardiac Stents and +CHF  Normal cardiovascular exam+ dysrhythmias  Rhythm:regular Rate:Normal     Neuro/Psych negative neurological ROS     GI/Hepatic GERD  Controlled,(+) Cirrhosis       ,   Endo/Other  diabetes, Type 2, Insulin DependentMorbid obesity  Renal/GU negative Renal ROS     Musculoskeletal  (+) Arthritis ,   Abdominal (+) + obese,   Peds  Hematology   Anesthesia Other Findings Free of chest pain PFO.  Hx of SVT.  Reproductive/Obstetrics                             Anesthesia Physical  Anesthesia Plan  ASA: III  Anesthesia Plan: MAC   Post-op Pain Management:    Induction: Intravenous  PONV Risk Score and Plan: 1 and Ondansetron and Dexamethasone  Airway Management Planned: Mask and Natural Airway  Additional Equipment:   Intra-op Plan:   Post-operative Plan:   Informed Consent: I have reviewed the patients History and Physical, chart, labs and discussed the procedure including the risks, benefits and alternatives for the proposed anesthesia with the patient or authorized representative who has indicated his/her understanding and acceptance.   Dental Advisory Given  Plan Discussed with: CRNA and Surgeon  Anesthesia Plan Comments:        Anesthesia Quick Evaluation

## 2017-03-23 ENCOUNTER — Telehealth: Payer: Self-pay | Admitting: Gastroenterology

## 2017-03-23 ENCOUNTER — Telehealth: Payer: Self-pay | Admitting: *Deleted

## 2017-03-23 ENCOUNTER — Encounter (HOSPITAL_COMMUNITY): Payer: Self-pay | Admitting: Gastroenterology

## 2017-03-23 LAB — CBC WITH DIFFERENTIAL/PLATELET
BASOS PCT: 1 %
Basophils Absolute: 0.1 10*3/uL (ref 0.0–0.1)
EOS ABS: 0.4 10*3/uL (ref 0.0–0.7)
EOS PCT: 8 %
HCT: 34 % — ABNORMAL LOW (ref 39.0–52.0)
HEMOGLOBIN: 11.9 g/dL — AB (ref 13.0–17.0)
Lymphocytes Relative: 33 %
Lymphs Abs: 1.9 10*3/uL (ref 0.7–4.0)
MCH: 33.6 pg (ref 26.0–34.0)
MCHC: 35 g/dL (ref 30.0–36.0)
MCV: 96 fL (ref 78.0–100.0)
MONOS PCT: 13 %
Monocytes Absolute: 0.7 10*3/uL (ref 0.1–1.0)
NEUTROS PCT: 46 %
Neutro Abs: 2.6 10*3/uL (ref 1.7–7.7)
PLATELETS: 54 10*3/uL — AB (ref 150–400)
RBC: 3.54 MIL/uL — AB (ref 4.22–5.81)
RDW: 14.1 % (ref 11.5–15.5)
WBC: 5.7 10*3/uL (ref 4.0–10.5)

## 2017-03-23 LAB — BASIC METABOLIC PANEL
Anion gap: 7 (ref 5–15)
BUN: 10 mg/dL (ref 6–20)
CO2: 21 mmol/L — ABNORMAL LOW (ref 22–32)
CREATININE: 0.79 mg/dL (ref 0.61–1.24)
Calcium: 8.4 mg/dL — ABNORMAL LOW (ref 8.9–10.3)
Chloride: 103 mmol/L (ref 101–111)
Glucose, Bld: 255 mg/dL — ABNORMAL HIGH (ref 65–99)
POTASSIUM: 3.8 mmol/L (ref 3.5–5.1)
SODIUM: 131 mmol/L — AB (ref 135–145)

## 2017-03-23 LAB — GLUCOSE, CAPILLARY
GLUCOSE-CAPILLARY: 164 mg/dL — AB (ref 65–99)
Glucose-Capillary: 286 mg/dL — ABNORMAL HIGH (ref 65–99)

## 2017-03-23 MED ORDER — VANCOMYCIN 50 MG/ML ORAL SOLUTION: 125.0000 mg | Freq: Four times a day (QID) | ORAL | 0 refills | Status: AC

## 2017-03-23 MED ORDER — VANCOMYCIN 50 MG/ML ORAL SOLUTION: 125.0000 mg | Freq: Four times a day (QID) | ORAL | 0 refills | Status: DC

## 2017-03-23 NOTE — Discharge Summary (Addendum)
Physician Discharge Summary  Adam Butler IRS:[id number] DOB: 12-02-1955 DOA: 03/21/2017  PCP: [name], NP  Admit date: 03/21/2017 Discharge date: 03/23/2017  Admitted From: Home Disposition:  Home  Recommendations for Outpatient Follow-up:  1. Follow up with PCP in 1 week 2. Follow up with [facility], they will call to set up appointment and for pathology result 3. Please obtain CBC in 1 week to ensure stability of Hgb   Discharge Condition: Stable CODE STATUS: Full  Diet recommendation: Heart healthy   Brief/Interim Summary: [name] a 61 y.o.malewith medical history significant of NASH cirrhosis, esophageal varices and diabetes mellitus who was sent over as a direct admission on the request of [facility] after patient was called reporting that in the past week, he started having frequent stooling, reportedly up to 10 and 15 bowel movements a day. He says that they started getting dark. Seen by GI following arrival to floor, they ordered C. difficile and GI PCR were negative. He underwent colonoscopy on 9/12 which revealed moderate patchy inflammation throughout the entire examined colon, secondary to suspected infectious colitis, rectal varices without did not of bleeding, internal hemorrhoids without did not of bleeding. Several biopsies were obtained. Colon ulcer appeared to be the source of bleeding. Patient was started on empiric oral vancomycin based on colonoscopy exam. He will follow-up with GI as outpatient. He denied any rectal bleeding this morning, is passing gas and tolerating diet. Eager to go home.   Discharge Diagnoses:  Principal Problem:   Colitis Active Problems:   DIASTOLIC HEART FAILURE, CHRONIC   Esophageal varices without bleeding (HCC)   Tobacco abuse   Liver cirrhosis secondary to NASH (HCC)   CAD in native artery   Type 2 diabetes mellitus (HCC)   Hematochezia   Biliary obstruction  Diarrhea, concern for infectious colitis, with  rectal bleeding  -GI consulted -Colonoscopy 9/12 - moderate inflammation in colon, suspected infectious colitis, with rectal varices, internal hemorrhoids, biopsy pathology pending  -C Diff negative, but immunosuppressed and high suspicion. Vanco started by GI -GI PCR negative  -Hold lactulose   Chronic diastolic heart failure -Euvolemic,stable  NASH cirrhosis with esophageal varices -Hold lactulose, continue nadolol, spironolactone, lasix   DM type 2 -SSI   HLD -Continue lipitor   Hypokalemia  -Replaced  Hyponatremia -Stable     Discharge Instructions  Discharge Instructions    Call MD for:    Complete by:  As directed    Blood in stool   Call MD for:  difficulty breathing, headache or visual disturbances    Complete by:  As directed    Call MD for:  extreme fatigue    Complete by:  As directed    Call MD for:  hives    Complete by:  As directed    Call MD for:  persistant dizziness or light-headedness    Complete by:  As directed    Call MD for:  persistant nausea and vomiting    Complete by:  As directed    Call MD for:  severe uncontrolled pain    Complete by:  As directed    Call MD for:  temperature >100.4    Complete by:  As directed    Diet - low sodium heart healthy    Complete by:  As directed    Discharge instructions    Complete by:  As directed    You were cared for by a hospitalist during your hospital stay. If you have  any questions about your discharge medications or the care you received while you were in the hospital after you are discharged, you can call the unit and asked to speak with the hospitalist on call if the hospitalist that took care of you is not available. Once you are discharged, your primary care physician will handle any further medical issues. Please note that NO REFILLS for any discharge medications will be authorized once you are discharged, as it is imperative that you return to your primary care physician (or establish a  relationship with a primary care physician if you do not have one) for your aftercare needs so that they can reassess your need for medications and monitor your lab values.   Increase activity slowly    Complete by:  As directed      Allergies as of 03/23/2017      Reactions   Isosorbide Nitrate Other (See Comments)   Nose bleeds      Medication List    STOP taking these medications   aspirin EC 81 MG tablet   lactulose 10 GM/15ML solution Commonly known as:  CONSTULOSE     TAKE these medications   atorvastatin 10 MG tablet Commonly known as:  LIPITOR TAKE 1 TABLET BY MOUTH ONCE DAILY   cyanocobalamin 1000 MCG tablet Take 1,000 mcg by mouth daily. Vitamin B12   fluticasone 50 MCG/ACT nasal spray Commonly known as:  FLONASE Place 2 sprays into both nostrils 3 (three) times daily as needed for allergies or rhinitis (congestion).   furosemide 40 MG tablet Commonly known as:  LASIX Take 1 tablet (40 mg total) by mouth every morning.   glipiZIDE 10 MG tablet Commonly known as:  GLUCOTROL TAKE ONE TABLET BY MOUTH TWICE DAILY BEFORE  A  MEAL.   insulin regular 100 units/mL injection Commonly known as:  NOVOLIN R,HUMULIN R Inject 25-80 Units into the skin 3 (three) times daily as needed for high blood sugar (CBG >150).   multivitamin with minerals Tabs tablet Take 1 tablet by mouth daily.   nadolol 40 MG tablet Commonly known as:  CORGARD Take 1.5 tablets (60 mg) by mouth once daily   nitroGLYCERIN 0.4 MG SL tablet Commonly known as:  NITROSTAT Place 1 tablet (0.4 mg total) under the tongue every 5 (five) minutes as needed for chest pain (chest pain).   ranitidine 150 MG tablet Commonly known as:  ZANTAC Take 150 mg by mouth See admin instructions. Take 1 tablet (150 mg) by mouth every morning, may also take 1 tablet at night as needed for acid reflux   spironolactone 25 MG tablet Commonly known as:  ALDACTONE Take 0.5 tablets (12.5 mg total) by mouth daily.    vancomycin 50 mg/mL oral solution Commonly known as:  VANCOCIN Take 2.5 mLs (125 mg total) by mouth every 6 (six) hours.            Discharge Care Instructions        Start     Ordered   03/23/17 0000  Increase activity slowly     03/23/17 1249   03/23/17 0000  Diet - low sodium heart healthy     03/23/17 1249   03/23/17 0000  Discharge instructions    Comments:  You were cared for by a hospitalist during your hospital stay. If you have any questions about your discharge medications or the care you received while you were in the hospital after you are discharged, you can call the unit and asked to  speak with the hospitalist on call if the hospitalist that took care of you is not available. Once you are discharged, your primary care physician will handle any further medical issues. Please note that NO REFILLS for any discharge medications will be authorized once you are discharged, as it is imperative that you return to your primary care physician (or establish a relationship with a primary care physician if you do not have one) for your aftercare needs so that they can reassess your need for medications and monitor your lab values.   03/23/17 1249   03/23/17 0000  Call MD for:  temperature >100.4     03/23/17 1249   03/23/17 0000  Call MD for:  persistant nausea and vomiting     03/23/17 1249   03/23/17 0000  Call MD for:  severe uncontrolled pain     03/23/17 1249   03/23/17 0000  Call MD for:  extreme fatigue     03/23/17 1249   03/23/17 0000  Call MD for:  persistant dizziness or light-headedness     03/23/17 1249   03/23/17 0000  Call MD for:  hives     03/23/17 1249   03/23/17 0000  Call MD for:  difficulty breathing, headache or visual disturbances     03/23/17 1249   03/23/17 0000  Call MD for:    Comments:  Blood in stool   03/23/17 1249   03/23/17 0000  vancomycin (VANCOCIN) 50 mg/mL oral solution  Every 6 hours     03/23/17 1250     Follow-up Information     [name], NP. Schedule an appointment as soon as possible for a visit in 1 week(s).   Specialty:  Internal Medicine Contact information: [place] [address] [facility] Follow up.   Specialty:  Gastroenterology Why:  They will call you for appointment  Contact information: [place] [id number] [phone]         Allergies  Allergen Reactions  . Isosorbide Nitrate Other (See Comments)    Nose bleeds    Consultations:  GI   Procedures/Studies: US Abdomen Complete  Result Date: 03/02/2017 CLINICAL DATA:  Hepatic cirrhosis.  Sepsis. EXAM: ABDOMEN ULTRASOUND COMPLETE COMPARISON:  CT abdomen and pelvis February 22, 2016; MR abdomen February 23, 2016 FINDINGS: Gallbladder: Within the gallbladder, there are echogenic foci which move and shadow consistent with cholelithiasis. Largest gallstone measures 8 mm. Gallbladder wall appears mildly thickened and slightly edematous. No pericholecystic fluid evident. No sonographic Murphy sign noted by sonographer. Common bile duct: Diameter: 3 mm. No intrahepatic, common hepatic, or common bile duct dilatation. Liver: No focal lesion identified. The liver has a nodular contour and appears rather small overall. The echogenicity of the liver overall is increased. Portal vein is patent on color Doppler imaging with normal direction of blood flow towards the liver. Note, however, that there is recanalization of the umbilical vein. IVC: No abnormality visualized in areas that can be interrogated. Much of the infrahepatic inferior vena cava is obscured by gas. Pancreas: Essentially completely obscured by gas. Spleen: Spleen measures 16.6 x 18.2 x 10.9 cm with a measured splenic volume of 1,724 cubic cm. No focal splenic lesions are evident. Right Kidney: Length: 12.8 cm. Echogenicity within normal limits. No mass or hydronephrosis visualized. Left Kidney:  Length: 13.4 cm. Echogenicity within normal limits. No mass or hydronephrosis visualized. Abdominal aorta: Most of aorta  obscured by gas. Other findings: No demonstrable ascites. IMPRESSION: 1. Gallstones with mildly thickened and slightly edematous gallbladder wall. This appearance is concerning for potential acute cholecystitis. 2. Liver has a nodular contour consistent with hepatic cirrhosis. Liver echogenicity is increased, a finding that may be seen with hepatic steatosis and/or parenchymal liver disease. While no focal liver lesions are evident on this study, it must be cautioned that the sensitivity of ultrasound for detection of focal liver lesions is diminished in this circumstance. Portal vein flow is in the anatomic direction. There is recanalization of the umbilical vein. 3.  Splenomegaly. 4.  No demonstrable ascites. 5. Pancreas and most of aorta obscured by gas. Most of the infrahepatic inferior vena cava obscured by gas. Electronically Signed   By: Lowella Grip III M.D.   On: 03/02/2017 11:03   Ct Abdomen Pelvis W Contrast  Result Date: 03/02/2017 CLINICAL DATA:  Cirrhosis with hepatic encephalopathy and esophageal varices. Confusion. EXAM: CT ABDOMEN AND PELVIS WITH CONTRAST TECHNIQUE: Multidetector CT imaging of the abdomen and pelvis was performed using the standard protocol following bolus administration of intravenous contrast. CONTRAST:  124m ISOVUE-300 IOPAMIDOL (ISOVUE-300) INJECTION 61% COMPARISON:  02/22/2016 FINDINGS: Lower chest:  Unremarkable. Hepatobiliary: Nodular hepatic contour compatible with cirrhosis. No focal abnormality within the liver parenchyma. The gallbladder is distended with numerous tiny calcified gallstones evident. No intrahepatic or extrahepatic biliary dilation. Pancreas: No focal mass lesion. No dilatation of the main duct. No intraparenchymal cyst. No peripancreatic edema. Spleen: Spleen measures 14 cm cranial caudal length. Adrenals/Urinary Tract: No  adrenal nodule or mass. Stable appearance tiny low-density lesions inferior right kidney, likely cysts. 2-3 mm nonobstructing stone in the lower pole left kidney is stable. No evidence for hydroureter. The urinary bladder appears normal for the degree of distention. Stomach/Bowel: Stomach is nondistended. No gastric wall thickening. No evidence of outlet obstruction. Duodenum is normally positioned as is the ligament of Treitz. No small bowel wall thickening. No small bowel dilatation. The terminal ileum is normal. The appendix is not visualized, but there is no edema or inflammation in the region of the cecum. Diverticular changes are noted in the left colon without evidence of diverticulitis. Vascular/Lymphatic: There is abdominal aortic atherosclerosis without aneurysm. Calcification is seen along the porta splenic confluence and proximal portal vein, stable. This may be related to chronic thrombus, but is unchanged. The portal vein does appear patent. Recanalization of the paraumbilical vein and upper abdominal venous collateralization including perigastric and paraesophageal varices are compatible with portal venous hypertension. There is no gastrohepatic or hepatoduodenal ligament lymphadenopathy. No intraperitoneal or retroperitoneal lymphadenopathy. No pelvic sidewall lymphadenopathy. Reproductive: The prostate gland and seminal vesicles have normal imaging features. Other: Trace fluid is seen adjacent to the liver and in the pelvis. Musculoskeletal: Bone windows reveal no worrisome lytic or sclerotic osseous lesions. IMPRESSION: 1. No new or progressive interval findings. 2. Cirrhosis with portal venous hypertension. Paraesophageal varices evident. Calcification in the wall the portal vein may be related to prior adherent thrombus, but this is stable and portal vein does appear patent on today's exam. 3.  Aortic Atherosclerois (ICD10-170.0) 4. Cholelithiasis 5. Left colonic diverticulosis without  diverticulitis. 6. Electronically Signed   By: EMisty StanleyM.D.   On: 03/02/2017 16:56   Dg Chest Port 1 View  Result Date: 03/02/2017 CLINICAL DATA:  Shortness of Breath EXAM: PORTABLE CHEST 1 VIEW COMPARISON:  March 02, 2017 study obtained earlier in the day ; January 31, 2017 FINDINGS: There is no appreciable edema or  consolidation. Heart is upper normal in size with pulmonary vascularity within normal limits. No adenopathy. There is aortic atherosclerosis. There is an old healed fracture of the left clavicle. IMPRESSION: No edema or consolidation. Stable cardiac silhouette. There is aortic atherosclerosis. Aortic Atherosclerosis (ICD10-I70.0). Electronically Signed   By: Lowella Grip III M.D.   On: 03/02/2017 13:33   Dg Chest Port 1 View  Result Date: 03/02/2017 CLINICAL DATA:  Dyspnea and chest pain x8 hours EXAM: PORTABLE CHEST 1 VIEW COMPARISON:  01/31/2017 FINDINGS: Crowding of interstitial lung markings due to low lung volumes. Atelectasis noted at the right lung base. Accentuated cardiac silhouette size likely due to portable technique. The aorta is not well visualized but the arch due to projection. No pneumonic consolidation, pneumothorax nor definite effusion. No acute nor suspicious osseous abnormality. IMPRESSION: Low lung volumes with crowding of interstitial lung markings. Right basilar atelectasis. Electronically Signed   By: Ashley Royalty M.D.   On: 03/02/2017 03:50    Colonoscopy 9/12 Impression:               - The examined portion of the ileum was normal.                           - Redundant colon.                           - Diverticulosis in the left colon.                           - One 10 mm polyp in the proximal ascending colon.                            Not removed (see above)                           - Patchy moderate inflammation was found in the                            entire examined colon secondary to suspected                            infectious  colitis.                           - Rectal varices. No stigmata of bleeding.                           - Internal hemorrhoids. No stigmata of bleeding.                           - Several biopsies were obtained in the distal                            sigmoid colon, in the transverse colon and in the                            cecum.  The colon ulcers appear to be the source of                            bleeding rather than the rectal varices or                             hemorrhoids.    Discharge Exam: Vitals:   03/23/17 0444 03/23/17 0843  BP: (!) 127/55 (!) 105/47  Pulse: 76 61  Resp: 16   Temp: 98.1 F (36.7 C)   SpO2: 95%    Vitals:   03/22/17 1248 03/22/17 2115 03/23/17 0444 03/23/17 0843  BP: (!) 110/54 96/60 (!) 127/55 (!) 105/47  Pulse: 65 69 76 61  Resp: 18 16 16    Temp: (!) 97.4 F (36.3 C) 98.1 F (36.7 C) 98.1 F (36.7 C)   TempSrc: Oral Oral Oral   SpO2: 98% 97% 95%   Weight:      Height:         General: Pt is alert, awake, not in acute distress Cardiovascular: RRR, S1/S2 +, no rubs, no gallops Respiratory: CTA bilaterally, no wheezing, no rhonchi Abdominal: Soft, NT, ND, bowel sounds + Extremities: no edema, no cyanosis    The results of significant diagnostics from this hospitalization (including imaging, microbiology, ancillary and laboratory) are listed below for reference.     Microbiology: Recent Results (from the past 240 hour(s))  Gastrointestinal Panel by PCR , Stool     Status: None   Collection Time: 03/21/17  4:14 PM  Result Value Ref Range Status   Campylobacter species NOT DETECTED NOT DETECTED Final   Plesimonas shigelloides NOT DETECTED NOT DETECTED Final   Salmonella species NOT DETECTED NOT DETECTED Final   Yersinia enterocolitica NOT DETECTED NOT DETECTED Final   Vibrio species NOT DETECTED NOT DETECTED Final   Vibrio cholerae NOT DETECTED NOT DETECTED Final   Enteroaggregative E coli (EAEC) NOT  DETECTED NOT DETECTED Final   Enteropathogenic E coli (EPEC) NOT DETECTED NOT DETECTED Final   Enterotoxigenic E coli (ETEC) NOT DETECTED NOT DETECTED Final   Shiga like toxin producing E coli (STEC) NOT DETECTED NOT DETECTED Final   Shigella/Enteroinvasive E coli (EIEC) NOT DETECTED NOT DETECTED Final   Cryptosporidium NOT DETECTED NOT DETECTED Final   Cyclospora cayetanensis NOT DETECTED NOT DETECTED Final   Entamoeba histolytica NOT DETECTED NOT DETECTED Final   Giardia lamblia NOT DETECTED NOT DETECTED Final   Adenovirus F40/41 NOT DETECTED NOT DETECTED Final   Astrovirus NOT DETECTED NOT DETECTED Final   Norovirus GI/GII NOT DETECTED NOT DETECTED Final   Rotavirus A NOT DETECTED NOT DETECTED Final   Sapovirus (I, II, IV, and V) NOT DETECTED NOT DETECTED Final  C difficile quick scan w PCR reflex     Status: None   Collection Time: 03/21/17  4:14 PM  Result Value Ref Range Status   C Diff antigen NEGATIVE NEGATIVE Final   C Diff toxin NEGATIVE NEGATIVE Final   C Diff interpretation No C. difficile detected.  Final     Labs: BNP (last 3 results)  Recent Labs  03/02/17 1009  BNP 16.1   Basic Metabolic Panel:  Recent Labs Lab 03/21/17 1930 03/22/17 0405 03/23/17 0904  NA 131* 136 131*  K 3.3* 3.0* 3.8  CL 101 105 103  CO2 24 22 21*  GLUCOSE 308* 117* 255*  BUN 8 8 10   CREATININE  0.82 0.63 0.79  CALCIUM 8.5* 8.6* 8.4*   Liver Function Tests: No results for input(s): AST, ALT, ALKPHOS, BILITOT, PROT, ALBUMIN in the last 168 hours. No results for input(s): LIPASE, AMYLASE in the last 168 hours. No results for input(s): AMMONIA in the last 168 hours. CBC:  Recent Labs Lab 03/21/17 1027 03/21/17 1930 03/22/17 0405 03/23/17 0426  WBC 7.8 7.7 7.5 5.7  NEUTROABS 4.4  --   --  2.6  HGB 14.0 13.5 13.3 11.9*  HCT 41.2 38.9* 38.2* 34.0*  MCV 102.6* 98.5 96.2 96.0  PLT 77.0* 75* 66* 54*   Cardiac Enzymes: No results for input(s): CKTOTAL, CKMB, CKMBINDEX,  TROPONINI in the last 168 hours. BNP: Invalid input(s): POCBNP CBG:  Recent Labs Lab 03/22/17 1308 03/22/17 1704 03/22/17 2113 03/23/17 0747 03/23/17 1212  GLUCAP 152* 226* 208* 164* 286*   D-Dimer No results for input(s): DDIMER in the last 72 hours. Hgb A1c No results for input(s): HGBA1C in the last 72 hours. Lipid Profile No results for input(s): CHOL, HDL, LDLCALC, TRIG, CHOLHDL, LDLDIRECT in the last 72 hours. Thyroid function studies No results for input(s): TSH, T4TOTAL, T3FREE, THYROIDAB in the last 72 hours.  Invalid input(s): FREET3 Anemia work up No results for input(s): VITAMINB12, FOLATE, FERRITIN, TIBC, IRON, RETICCTPCT in the last 72 hours. Urinalysis    Component Value Date/Time   COLORURINE AMBER (A) 03/02/2017 0426   APPEARANCEUR CLEAR 03/02/2017 0426   LABSPEC 1.021 03/02/2017 0426   PHURINE 6.0 03/02/2017 0426   GLUCOSEU 150 (A) 03/02/2017 0426   HGBUR MODERATE (A) 03/02/2017 0426   BILIRUBINUR NEGATIVE 03/02/2017 0426   KETONESUR NEGATIVE 03/02/2017 0426   PROTEINUR 30 (A) 03/02/2017 0426   UROBILINOGEN 0.2 05/14/2015 1813   NITRITE NEGATIVE 03/02/2017 0426   LEUKOCYTESUR NEGATIVE 03/02/2017 0426   Sepsis Labs Invalid input(s): PROCALCITONIN,  WBC,  LACTICIDVEN Microbiology Recent Results (from the past 240 hour(s))  Gastrointestinal Panel by PCR , Stool     Status: None   Collection Time: 03/21/17  4:14 PM  Result Value Ref Range Status   Campylobacter species NOT DETECTED NOT DETECTED Final   Plesimonas shigelloides NOT DETECTED NOT DETECTED Final   Salmonella species NOT DETECTED NOT DETECTED Final   Yersinia enterocolitica NOT DETECTED NOT DETECTED Final   Vibrio species NOT DETECTED NOT DETECTED Final   Vibrio cholerae NOT DETECTED NOT DETECTED Final   Enteroaggregative E coli (EAEC) NOT DETECTED NOT DETECTED Final   Enteropathogenic E coli (EPEC) NOT DETECTED NOT DETECTED Final   Enterotoxigenic E coli (ETEC) NOT DETECTED NOT  DETECTED Final   Shiga like toxin producing E coli (STEC) NOT DETECTED NOT DETECTED Final   Shigella/Enteroinvasive E coli (EIEC) NOT DETECTED NOT DETECTED Final   Cryptosporidium NOT DETECTED NOT DETECTED Final   Cyclospora cayetanensis NOT DETECTED NOT DETECTED Final   Entamoeba histolytica NOT DETECTED NOT DETECTED Final   Giardia lamblia NOT DETECTED NOT DETECTED Final   Adenovirus F40/41 NOT DETECTED NOT DETECTED Final   Astrovirus NOT DETECTED NOT DETECTED Final   Norovirus GI/GII NOT DETECTED NOT DETECTED Final   Rotavirus A NOT DETECTED NOT DETECTED Final   Sapovirus (I, II, IV, and V) NOT DETECTED NOT DETECTED Final  C difficile quick scan w PCR reflex     Status: None   Collection Time: 03/21/17  4:14 PM  Result Value Ref Range Status   C Diff antigen NEGATIVE NEGATIVE Final   C Diff toxin NEGATIVE NEGATIVE Final   C Diff interpretation  No C. difficile detected.  Final     Time coordinating discharge: 40 minutes  SIGNED:  Dessa Phi, DO [facility] Pager [phone]  If 7PM-7AM, please contact night-coverage [email or web address] Password [facility] 03/23/2017, 12:55 PM

## 2017-03-23 NOTE — Telephone Encounter (Signed)
Advised patient that he can try [facility]. Number given. He has written script and will contact them.

## 2017-03-23 NOTE — Progress Notes (Signed)
Progress Note   Subjective  Chief Complaint: Hematochezia, Diarrhea, NASH cirrhosis  Pt tells me he has had no further BM since time of colonoscopy yesterday, no abdominal pain, tolerating regular diet. He is begging me to go home. He denies any new complaints.    Objective   Vital signs in last 24 hours: Temp:  [97.4 F (36.3 C)-98.1 F (36.7 C)] 98.1 F (36.7 C) (09/13 0444) Pulse Rate:  [61-76] 61 (09/13 0843) Resp:  [16-24] 16 (09/13 0444) BP: (96-181)/(47-78) 105/47 (09/13 0843) SpO2:  [93 %-100 %] 95 % (09/13 0444) Weight:  [268 lb (121.6 kg)] 268 lb (121.6 kg) (09/12 1034) Last BM Date: 03/22/17 General:    Caucasian male in NAD Heart:  Regular rate and rhythm; no murmurs Lungs: Respirations even and unlabored, lungs CTA bilaterally Abdomen:  Soft, nontender and nondistended. Normal bowel sounds. Extremities:  Without edema. Neurologic:  Alert and oriented,  grossly normal neurologically. Psych:  Cooperative. Normal mood and affect.  Intake/Output from previous day: 09/12 0701 - 09/13 0700 In: 1180 [P.O.:480; I.V.:400; IV Piggyback:300] Out: 0   Lab Results:  Recent Labs  03/21/17 1930 03/22/17 0405 03/23/17 0426  WBC 7.7 7.5 5.7  HGB 13.5 13.3 11.9*  HCT 38.9* 38.2* 34.0*  PLT 75* 66* 54*   BMET  Recent Labs  03/21/17 1930 03/22/17 0405  NA 131* 136  K 3.3* 3.0*  CL 101 105  CO2 24 22  GLUCOSE 308* 117*  BUN 8 8  CREATININE 0.82 0.63  CALCIUM 8.5* 8.6*   Studies/Results: Colonoscopy 03/22/17-Dr. Loletha Carrow: Findings:      The perianal and digital rectal examinations were normal.      The terminal ileum appeared normal.      The colon (entire examined portion) was moderately redundant.      Diverticula were found in the left colon.      A 10 mm polyp was found in the proximal ascending colon. The polyp was       sessile. Purposely not removed due to thrombocytopenia and so as not to       prolong procedure during acute illness with active  colitis.      Patchy moderate inflammation characterized by congestion (edema),       erythema, loss of vascularity and shallow ulcerations was found in the       entire colon. Several biopsies were obtained in the distal sigmoid       colon, in the transverse colon and in the cecum with cold forceps for       histology.      Non-bleeding rectal varices were found.      Internal hemorrhoids were found. The hemorrhoids were medium-sized and       Grade I (internal hemorrhoids that do not prolapse).      Retroflexion in the rectum was not performed due to the presence of       large varices. Impression:               - The examined portion of the ileum was normal.                           - Redundant colon.                           - Diverticulosis in the left colon.                           -  One 10 mm polyp in the proximal ascending colon.                            Not removed (see above)                           - Patchy moderate inflammation was found in the                            entire examined colon secondary to suspected                            infectious colitis.                           - Rectal varices. No stigmata of bleeding.                           - Internal hemorrhoids. No stigmata of bleeding.                           - Several biopsies were obtained in the distal                            sigmoid colon, in the transverse colon and in the                            cecum.                           The colon ulcers appear to be the source of                            bleeding rather than the rectal varices or                            hemorrhoids. Moderate Sedation:      MAC sedation used Recommendation:           - Return patient to hospital ward for ongoing care.                           - Resume regular diet.                           - Await pathology results.                           - Repeat colonoscopy is recommended. The                             colonoscopy date will be determined after pathology                            results from today's exam become available for  review.                           Await results of GI pathogen panel.                           Despite negative C difficile testing, the clinical                            picture is still worrisome for that possibility in                            a cirrhotic (immunocompromised) patient with recent                            hospitalization and antibiotic treatment. Empiric                            vancomycin will be started.                           - Continue present medications.    Assessment / Plan:   Assessment: 1. Hematochezia: colitis seen at time of colonoscopy 9/12-pt started on empiric Vancomycin-symptoms improved overnight per pt who is eager to go home  Plan: 1. Continue Vancomycin QID x10  days 2. Pathology pending 3. Dr. Loletha Carrow is ok with d/c home today. He will get a call regarding follow up from our office  Discharge Planning Diet: Regular Anticoagulation and antiplatelets: n/a Discharge Medications: Vancomycin Follow up: TBD  Thank you for your kind consultation, we will sign off.   LOS: 2 days   Levin Erp  03/23/2017, 9:57 AM  Pager # [phone]

## 2017-03-23 NOTE — Telephone Encounter (Signed)
Patient was scheduled for an appointment with Ellouise Newer, PA-C on 04/06/17 at 1:45 pm. Patient verbalizes understanding.

## 2017-03-23 NOTE — Telephone Encounter (Signed)
-----   Message from Jerene Bears, MD sent at 03/23/2017 12:55 PM EDT ----- Regarding: FW: D/c today Pt needs appt with me or APP within 2 weeks Discharged from hospital today JMP  ----- Message ----- From: Levin Erp, PA Sent: 03/23/2017  12:41 PM To: Jerene Bears, MD Subject: D/c today                                      Pt is being discharged today per his request. Path is pending. He is on vanc empirically x10d- improvement in symptoms overnight. Dr. Loletha Carrow wanted me to alert you so you could arrange follow up as you see fit.  Thanks-JLL

## 2017-03-24 ENCOUNTER — Telehealth: Payer: Self-pay

## 2017-03-24 NOTE — Telephone Encounter (Signed)
Transition Care Management Follow-up Telephone Call   Date discharged? 03/23/17    How have you been since you were released from the hospital? "I feel better"   Do you understand why you were in the hospital? yes   Do you understand the discharge instructions? yes   Where were you discharged to? Home, lives with wife.    Items Reviewed:  Medications reviewed: no, pt outside at time of call. Stated he stopped his ASA and Lactulose, given antibiotic.  Allergies reviewed: yes  Dietary changes reviewed: yes  Referrals reviewed: yes   Functional Questionnaire:   Activities of Daily Living (ADLs):   He states they are independent in the following: ambulation, bathing and hygiene, feeding, continence, grooming, toileting and dressing States they require assistance with the following: None   Any transportation issues/concerns?: no   Any patient concerns? no   Confirmed importance and date/time of follow-up visits scheduled yes  Provider Appointment booked with PCP on 03/30/17 @ 1045.   Confirmed with patient if condition begins to worsen call PCP or go to the ER.  Patient was given the office number and encouraged to call back with question or concerns.  : yes

## 2017-03-26 ENCOUNTER — Other Ambulatory Visit: Payer: Self-pay | Admitting: Internal Medicine

## 2017-03-30 ENCOUNTER — Ambulatory Visit (INDEPENDENT_AMBULATORY_CARE_PROVIDER_SITE_OTHER): Payer: PPO | Admitting: Internal Medicine

## 2017-03-30 ENCOUNTER — Encounter: Payer: Self-pay | Admitting: Internal Medicine

## 2017-03-30 VITALS — BP 108/68 | HR 64 | Temp 98.2°F | Wt 283.0 lb

## 2017-03-30 DIAGNOSIS — K529 Noninfective gastroenteritis and colitis, unspecified: Secondary | ICD-10-CM

## 2017-03-30 DIAGNOSIS — F419 Anxiety disorder, unspecified: Secondary | ICD-10-CM | POA: Diagnosis not present

## 2017-03-30 DIAGNOSIS — K922 Gastrointestinal hemorrhage, unspecified: Secondary | ICD-10-CM

## 2017-03-30 LAB — CBC
HEMATOCRIT: 37.4 % — AB (ref 39.0–52.0)
HEMOGLOBIN: 12.5 g/dL — AB (ref 13.0–17.0)
MCHC: 33.6 g/dL (ref 30.0–36.0)
MCV: 103 fl — ABNORMAL HIGH (ref 78.0–100.0)
PLATELETS: 64 10*3/uL — AB (ref 150.0–400.0)
RBC: 3.63 Mil/uL — ABNORMAL LOW (ref 4.22–5.81)
RDW: 15.1 % (ref 11.5–15.5)
WBC: 5.7 10*3/uL (ref 4.0–10.5)

## 2017-03-30 MED ORDER — PAROXETINE HCL 10 MG PO TABS: 10.0000 mg | ORAL_TABLET | Freq: Every day | ORAL | 5 refills | Status: DC

## 2017-03-30 NOTE — Patient Instructions (Signed)

## 2017-03-30 NOTE — Progress Notes (Signed)
Subjective:    Patient ID: Adam Butler, male    DOB: 1956/06/04, 61 y.o.   MRN: [id number]  HPI  Pt presents to the clinic today for [facility] Follow Up. He was direct admitted by GI for c/o diarrhea and BRBPR. C diff was negative. Colonoscopy showed infectious colitis, rectal varices without bleeding, internal hemorrhoids without bleeding and a bleeding ulcer in the colon. His Lactulose was held. He was started on oral Vancomycin. He was discharged and advised to follow up with GI as an outpatient and PCP in 1 week to check HGB. Since discharge, he reports he is no longer having abdominal pain, loose stools or blood in his stool. He has been having 3 BM's per day. He denies confusion.  He does reports feeling very anxious about his health in general. He was on Paxil at one time and thinks he may need to restart on this.  Review of Systems      Past Medical History:  Diagnosis Date  . Arthritis   . Cholelithiasis   . Cirrhosis ([place]) 2011   Cryptogenic, Likely NASH. Family/pt deny EtOH. HCV, HBV, HAV negative. ANA negative. AMA positive. Ascites 12/11  . Coronary artery disease    Inferior MI 12/11; LHC with occluded mid CFX and 80% proximal RCA. EF 55%. He had 3.0 x 28 vision BMS to CFX  . Diastolic CHF, acute ([place])    Echo 12/11 with ef 50-55% and mild LVH. EF 55% by LV0gram in 12/11  . Esophageal varices ([place]) 2011, 2013   no hx acute variceal bleed  . Hepatic encephalopathy ([place]) 2011, 12/2013  . Hyperlipemia   . Myocardial infarction [facility]) ? 2012  . PFO (patent foramen ovale): Per TEE 04/20/2015 04/20/2015  . Portal hypertension ([place])   . Rectal varices   . S/P coronary artery stent placement 06/2010  . SVT (supraventricular tachycardia) ([place])    1/12: appeared to be an ectopic atrial tachycardia. Required DCCV with hemodynamic instability  . Type II diabetes mellitus ([place]) 2011    Current Outpatient Prescriptions  Medication Sig Dispense Refill  . atorvastatin  (LIPITOR) 10 MG tablet TAKE 1 TABLET BY MOUTH ONCE DAILY 90 tablet 2  . cyanocobalamin 1000 MCG tablet Take 1,000 mcg by mouth daily. Vitamin B12    . fluticasone (FLONASE) 50 MCG/ACT nasal spray Place 2 sprays into both nostrils 3 (three) times daily as needed for allergies or rhinitis (congestion).    . furosemide (LASIX) 40 MG tablet Take 1 tablet (40 mg total) by mouth every morning. 90 tablet 3  . glipiZIDE (GLUCOTROL) 10 MG tablet TAKE 1 TABLET BY MOUTH TWICE DAILY BEFORE  A  MEAL 180 tablet 0  . insulin regular (NOVOLIN R,HUMULIN R) 100 units/mL injection Inject 25-80 Units into the skin 3 (three) times daily as needed for high blood sugar (CBG >150).    . Multiple Vitamin (MULTIVITAMIN WITH MINERALS) TABS tablet Take 1 tablet by mouth daily.    . nadolol (CORGARD) 40 MG tablet Take 1.5 tablets (60 mg) by mouth once daily 45 tablet 2  . nitroGLYCERIN (NITROSTAT) 0.4 MG SL tablet Place 1 tablet (0.4 mg total) under the tongue every 5 (five) minutes as needed for chest pain (chest pain). 25 tablet 5  . ranitidine (ZANTAC) 150 MG tablet Take 150 mg by mouth See admin instructions. Take 1 tablet (150 mg) by mouth every morning, may also take 1 tablet at night as needed for acid reflux    .  spironolactone (ALDACTONE) 25 MG tablet Take 0.5 tablets (12.5 mg total) by mouth daily. 30 tablet 6  . vancomycin (VANCOCIN) 50 mg/mL oral solution Take 2.5 mLs (125 mg total) by mouth every 6 (six) hours. 100 mL 0   No current facility-administered medications for this visit.     Allergies  Allergen Reactions  . Isosorbide Nitrate Other (See Comments)    Nose bleeds    Family History  Problem Relation Age of Onset  . Heart attack Brother 74       MI  . Diabetes Brother   . Heart attack Father   . Diabetes Father   . COPD Father   . COPD Mother   . Heart attack Brother   . Stroke Neg Hx     Social History   Social History  . Marital status: Married    Spouse name: N/A  . Number of  children: 3  . Years of education: N/A   Occupational History  . Unemployed Other    Worked in maintenance prior   Social History Main Topics  . Smoking status: Current Every Day Smoker    Packs/day: 0.25    Years: 36.00    Types: E-cigarettes, Cigarettes  . Smokeless tobacco: Never Used     Comment: uses vapor cigarettes (2016 ); I smoke about 4 cigarettes a day  . Alcohol use No  . Drug use: No  . Sexual activity: No   Other Topics Concern  . Not on file   Social History Narrative   Married   Gets regular exercise: walking     Constitutional: Denies fever, malaise, fatigue, headache or abrupt weight changes.  Gastrointestinal: Denies abdominal pain, bloating, constipation, diarrhea or blood in the stool.  Neurological: Denies dizziness, difficulty with memory, difficulty with speech or problems with balance and coordination.  Psych: Pt reports anxiety. Denies depression, SI/HI.  No other specific complaints in a complete review of systems (except as listed in HPI above).  Objective:   Physical Exam   BP 108/68   Pulse 64   Temp 98.2 F (36.8 C) (Oral)   Wt 283 lb (128.4 kg)   SpO2 97%   BMI 44.32 kg/m  Wt Readings from Last 3 Encounters:  03/30/17 283 lb (128.4 kg)  03/22/17 268 lb (121.6 kg)  03/21/17 274 lb (124.3 kg)    General: Appears his stated age, obese in NAD. SAbdomen: Soft and nontender. Normal bowel sounds. No distention noted. Neurological: Alert and oriented.  Psychiatric: Mood and affect normal. Behavior is normal. Judgment and thought content normal.     BMET    Component Value Date/Time   NA 131 (L) 03/23/2017 0904   K 3.8 03/23/2017 0904   CL 103 03/23/2017 0904   CO2 21 (L) 03/23/2017 0904   GLUCOSE 255 (H) 03/23/2017 0904   BUN 10 03/23/2017 0904   CREATININE 0.79 03/23/2017 0904   CREATININE 0.74 03/15/2011 0827   CALCIUM 8.4 (L) 03/23/2017 0904   GFRNONAA >60 03/23/2017 0904   GFRAA >60 03/23/2017 0904    Lipid Panel       Component Value Date/Time   CHOL 133 11/17/2016 0854   TRIG 133.0 11/17/2016 0854   HDL 39.90 11/17/2016 0854   CHOLHDL 3 11/17/2016 0854   VLDL 26.6 11/17/2016 0854   LDLCALC 67 11/17/2016 0854    CBC    Component Value Date/Time   WBC 5.7 03/23/2017 0426   RBC 3.54 (L) 03/23/2017 0426   HGB 11.9 (L)  03/23/2017 0426   HCT 34.0 (L) 03/23/2017 0426   PLT 54 (L) 03/23/2017 0426   MCV 96.0 03/23/2017 0426   MCH 33.6 03/23/2017 0426   MCHC 35.0 03/23/2017 0426   RDW 14.1 03/23/2017 0426   LYMPHSABS 1.9 03/23/2017 0426   MONOABS 0.7 03/23/2017 0426   EOSABS 0.4 03/23/2017 0426   BASOSABS 0.1 03/23/2017 0426    Hgb A1C Lab Results  Component Value Date   HGBA1C 9.5 (H) 02/24/2017           Assessment & Plan:   [facility] Follow Up for Lower GI Bleed:  Hospital notes, labs and imaging reviewed Continue to hold ASA and Lactulose If you start having < 3 BM's per day, notify GI to see if they want you to restart your Lactulose CBC today Follow up with GI as planned  Anxiety:  Will restart Paxil, eRx sent to pharmacy  Let me know how you are doing in 4 weeks, return precautions discussed [name], NP

## 2017-04-03 ENCOUNTER — Telehealth: Payer: Self-pay | Admitting: Internal Medicine

## 2017-04-03 NOTE — Telephone Encounter (Signed)
Pt states that he was taken off of lactulose when he was in the hospital. He is calling stating he only has one more day of the vancomycin and he wants to know when he should start taking the lactulose again. Please advise.

## 2017-04-03 NOTE — Telephone Encounter (Signed)
If his diarrhea (infectious) has resolved and he is feeling better Given his very significant hx of hepatic encephalopathy, I would start the lactulose back today at previous dose

## 2017-04-03 NOTE — Telephone Encounter (Signed)
Spoke with pt and he is aware.

## 2017-04-06 ENCOUNTER — Encounter: Payer: Self-pay | Admitting: Physician Assistant

## 2017-04-06 ENCOUNTER — Ambulatory Visit (INDEPENDENT_AMBULATORY_CARE_PROVIDER_SITE_OTHER): Payer: PPO | Admitting: Physician Assistant

## 2017-04-06 VITALS — BP 106/60 | HR 74 | Ht 67.0 in | Wt 276.0 lb

## 2017-04-06 DIAGNOSIS — K7581 Nonalcoholic steatohepatitis (NASH): Secondary | ICD-10-CM

## 2017-04-06 DIAGNOSIS — A09 Infectious gastroenteritis and colitis, unspecified: Secondary | ICD-10-CM | POA: Diagnosis not present

## 2017-04-06 DIAGNOSIS — D696 Thrombocytopenia, unspecified: Secondary | ICD-10-CM

## 2017-04-06 NOTE — Progress Notes (Addendum)
Chief Complaint: Follow-up from recent hospitalization for diarrhea  HPI:  Adam Butler is a 61 year old Caucasian male with a past medical history of NASH cirrhosis who follows with Dr. Hilarie Fredrickson and is here today to follow-up from recent hospitalization for diarrhea and hematochezia.    Please recall patient was admitted to the hospital 03/21/17 from our clinic for his complaints of diarrhea and hematochezia. Patient underwent colonoscopy 03/23/17 after negative GI pathogen panel and C. difficile. Patient was found to have redundant colon, diverticulosis in the left colon, one 10 mm polyp in the proximal ascending colon menses not removed), patchy moderate inflammation in the entire examined colon secondary to suspected infectious colitis, rectal varices with no stigmata of bleeding, internal hemorrhoids with no stigmata of bleeding and patient was started on vancomycin empirically.    Recent labs 03/30/17 show a CBC with hemoglobin improved to 12.5. Platelets are still low at 64.    Today, the patient presents to clinic and tells me that he feels much better. He remains somewhat tired and is having at least 5-6 stools a day, but this is after restarting his Lactulose on 04/03/17 as recommended 3 times a day. Patient was also just re-started on Paxil by his PCP. He has seen no further bleeding and overall is content.    Patient denies fever, chills, continued blood in the stool, melena, weight loss, nausea, vomiting, heartburn, reflux or abdominal pain.  Past Medical History:  Diagnosis Date  . Arthritis   . Cholelithiasis   . Cirrhosis ([place]) 2011   Cryptogenic, Likely NASH. Family/pt deny EtOH. HCV, HBV, HAV negative. ANA negative. AMA positive. Ascites 12/11  . Coronary artery disease    Inferior MI 12/11; LHC with occluded mid CFX and 80% proximal RCA. EF 55%. He had 3.0 x 28 vision BMS to CFX  . Diastolic CHF, acute ([place])    Echo 12/11 with ef 50-55% and mild LVH. EF 55% by LV0gram in 12/11  .  Esophageal varices ([place]) 2011, 2013   no hx acute variceal bleed  . Hepatic encephalopathy ([place]) 2011, 12/2013  . Hyperlipemia   . Myocardial infarction [facility]) ? 2012  . PFO (patent foramen ovale): Per TEE 04/20/2015 04/20/2015  . Portal hypertension ([place])   . Rectal varices   . S/P coronary artery stent placement 06/2010  . SVT (supraventricular tachycardia) ([place])    1/12: appeared to be an ectopic atrial tachycardia. Required DCCV with hemodynamic instability  . Type II diabetes mellitus ([place]) 2011    Past Surgical History:  Procedure Laterality Date  . APPENDECTOMY    . CARDIAC CATHETERIZATION  07/01/2010   BMS to CFX.  Marland Kitchen CATARACT EXTRACTION W/PHACO Left 04/28/2016   Procedure: CATARACT EXTRACTION PHACO AND INTRAOCULAR LENS PLACEMENT (IOC);  Surgeon: Leandrew Koyanagi, MD;  Location: [facility] ORS;  Service: Ophthalmology;  Laterality: Left;  Lot# [id number] H [place]: 05:31.7 AP%: 28.2 CDE: 93.48   . COLONOSCOPY  04/13/2012   Procedure: COLONOSCOPY;  Surgeon: Inda Castle, MD;  [facility];  Service: Endoscopy;  Laterality: N/A;  . COLONOSCOPY WITH PROPOFOL N/A 03/22/2017   Procedure: COLONOSCOPY WITH PROPOFOL;  Surgeon: Doran Stabler, MD;  [facility] ENDOS[facility];  Service: Gastroenterology;  Laterality: N/A;  . CORONARY ANGIOPLASTY    . CORONARY STENT PLACEMENT  06/30/2010   CFX   Distal        . ESOPHAGEAL BANDING N/A 01/01/2014   Procedure: ESOPHAGEAL BANDING;  Surgeon: Jerene Bears, MD;  Location: [place];  Service:  Endoscopy;  Laterality: N/A;  . ESOPHAGOGASTRODUODENOSCOPY  04/13/2012   Procedure: ESOPHAGOGASTRODUODENOSCOPY (EGD);  Surgeon: Inda Castle, MD;  [facility];  Service: Endoscopy;  Laterality: N/A;  . ESOPHAGOGASTRODUODENOSCOPY N/A 01/01/2014   Procedure: ESOPHAGOGASTRODUODENOSCOPY (EGD);  Surgeon: Jerene Bears, MD;  Location: [facility] END[facility]COPY;  Service: Endoscopy;  Laterality: N/A;  . ESOPHAGOGASTRODUODENOSCOPY (EGD) WITH PROPOFOL N/A  07/12/2016   Procedure: ESOPHAGOGASTRODUODENOSCOPY (EGD) WITH PROPOFOL;  Surgeon: Jerene Bears, MD;  [facility];  Service: Gastroenterology;  Laterality: N/A;  . LEFT HEART CATHETERIZATION WITH CORONARY ANGIOGRAM N/A 10/15/2014   Procedure: LEFT HEART CATHETERIZATION WITH CORONARY ANGIOGRAM;  Surgeon: Peter M Martinique, MD;  Location: [facility] CATH LAB;  Service: Cardiovascular;  Laterality: N/A;  . orif r leg    . TEE WITHOUT CARDIOVERSION N/A 04/20/2015   Procedure: TRANSESOPHAGEAL ECHOCARDIOGRAM (TEE);  Surgeon: Fay Records, MD;  Location: [facility] ENDOSCOPY;  Service: Cardiovascular;  Laterality: N/A;    Current Outpatient Prescriptions  Medication Sig Dispense Refill  . atorvastatin (LIPITOR) 10 MG tablet TAKE 1 TABLET BY MOUTH ONCE DAILY 90 tablet 2  . cyanocobalamin 1000 MCG tablet Take 1,000 mcg by mouth daily. Vitamin B12    . fluticasone (FLONASE) 50 MCG/ACT nasal spray Place 2 sprays into both nostrils 3 (three) times daily as needed for allergies or rhinitis (congestion).    . furosemide (LASIX) 40 MG tablet Take 1 tablet (40 mg total) by mouth every morning. 90 tablet 3  . glipiZIDE (GLUCOTROL) 10 MG tablet TAKE 1 TABLET BY MOUTH TWICE DAILY BEFORE  A  MEAL 180 tablet 0  . insulin regular (NOVOLIN R,HUMULIN R) 100 units/mL injection Inject 25-80 Units into the skin 3 (three) times daily as needed for high blood sugar (CBG >150).    . Multiple Vitamin (MULTIVITAMIN WITH MINERALS) TABS tablet Take 1 tablet by mouth daily.    . nadolol (CORGARD) 40 MG tablet Take 1.5 tablets (60 mg) by mouth once daily 45 tablet 2  . nitroGLYCERIN (NITROSTAT) 0.4 MG SL tablet Place 1 tablet (0.4 mg total) under the tongue every 5 (five) minutes as needed for chest pain (chest pain). 25 tablet 5  . PARoxetine (PAXIL) 10 MG tablet Take 1 tablet (10 mg total) by mouth daily. 30 tablet 5  . ranitidine (ZANTAC) 150 MG tablet Take 150 mg by mouth See admin instructions. Take 1 tablet (150 mg) by mouth every  morning, may also take 1 tablet at night as needed for acid reflux    . spironolactone (ALDACTONE) 25 MG tablet Take 0.5 tablets (12.5 mg total) by mouth daily. 30 tablet 6   No current facility-administered medications for this visit.     Allergies as of 04/06/2017 - Review Complete 04/06/2017  Allergen Reaction Noted  . Isosorbide nitrate Other (See Comments) 02/21/2015    Family History  Problem Relation Age of Onset  . Heart attack Brother 45       MI  . Diabetes Brother   . Heart attack Father   . Diabetes Father   . COPD Father   . COPD Mother   . Heart attack Brother   . Stroke Neg Hx     Social History   Social History  . Marital status: Married    Spouse name: N/A  . Number of children: 3  . Years of education: N/A   Occupational History  . Unemployed Other    Worked in maintenance prior   Social History Main Topics  . Smoking status: Current  Every Day Smoker    Packs/day: 0.25    Years: 36.00    Types: E-cigarettes, Cigarettes  . Smokeless tobacco: Never Used     Comment: uses vapor cigarettes (2016 ); I smoke about 4 cigarettes a day  . Alcohol use No  . Drug use: No  . Sexual activity: No   Other Topics Concern  . Not on file   Social History Narrative   Married   Gets regular exercise: walking    Review of Systems:    Constitutional: No weight loss, fever or chills Cardiovascular: No chest pain Respiratory: No SOB  Gastrointestinal: See HPI and otherwise negative   Physical Exam:  Vital signs: BP 106/60   Pulse 74   Ht 5' 7"  (1.702 m)   Wt 276 lb (125.2 kg)   BMI 43.23 kg/m   Constitutional:   Pleasant Caucasian male appears to be in NAD, Well developed, Well nourished, alert and cooperative Respiratory: Respirations even and unlabored. Lungs clear to auscultation bilaterally.   No wheezes, crackles, or rhonchi.  Cardiovascular: Normal S1, S2. No MRG. Regular rate and rhythm. No peripheral edema, cyanosis or pallor.    Gastrointestinal:  Soft, mild distension, nontender. No rebound or guarding. Normal bowel sounds. No appreciable masses or hepatomegaly.Marland Kitchen Psychiatric:Demonstrates good judgement and reason without abnormal affect or behaviors.  MOST RECENT LABS AND IMAGING: CBC    Component Value Date/Time   WBC 5.7 03/30/2017 1103   RBC 3.63 (L) 03/30/2017 1103   HGB 12.5 (L) 03/30/2017 1103   HCT 37.4 (L) 03/30/2017 1103   PLT 64.0 (L) 03/30/2017 1103   MCV 103.0 (H) 03/30/2017 1103   MCH 33.6 03/23/2017 0426   MCHC 33.6 03/30/2017 1103   RDW 15.1 03/30/2017 1103   LYMPHSABS 1.9 03/23/2017 0426   MONOABS 0.7 03/23/2017 0426   EOSABS 0.4 03/23/2017 0426   BASOSABS 0.1 03/23/2017 0426    CMP     Component Value Date/Time   NA 131 (L) 03/23/2017 0904   K 3.8 03/23/2017 0904   CL 103 03/23/2017 0904   CO2 21 (L) 03/23/2017 0904   GLUCOSE 255 (H) 03/23/2017 0904   BUN 10 03/23/2017 0904   CREATININE 0.79 03/23/2017 0904   CREATININE 0.74 03/15/2011 0827   CALCIUM 8.4 (L) 03/23/2017 0904   PROT 5.8 (L) 03/03/2017 0349   ALBUMIN 2.0 (L) 03/03/2017 0349   AST 81 (H) 03/03/2017 0349   ALT 45 03/03/2017 0349   ALKPHOS 75 03/03/2017 0349   BILITOT 2.6 (H) 03/03/2017 0349   GFRNONAA >60 03/23/2017 0904   GFRAA >60 03/23/2017 0904    Assessment: 1. Colitis: Resolved after empiric Vancomycin, etiology uncertain 2. [name] cirrhosis: Controlled at the moment, continue current medications 3. Some cytopenia: Likely related as above  Plan: 1. Patient's symptoms of colitis are improved. GI pathogen panel and C. difficile were negative. Patient improved after Vancomycin. Uncertain etiology. 2. Recommend the patient decrease his lactulose to twice a day dosing as he is currently having 6 stools per day. Discussed that he is shooting for 3-4 soft formed stools per day. Discussed titration. 3. Continue medications for cirrhosis 4. Patient does ask about restarting his aspirin. He also tells me he has  been having some nosebleeds recently, I told him to hold off on restarting his aspirin and discuss with his cardiology team, as they are the ones who started this for him. Recommended he call make an appointment. 5. Patient to follow in clinic with Dr. Hilarie Fredrickson in 3-4  months. Patient will need repeat colonoscopy to remove polyp recently seen at some point.  Ellouise Newer, PA-C [facility] 04/06/2017, 2:00 PM  Cc: [name], NP   Addendum: Reviewed and agree with management and plans for follow-up. Pyrtle, Lajuan Lines, MD

## 2017-04-06 NOTE — Patient Instructions (Signed)
Decrease Lactulose to twice a day, titrate as needed to 3-4 times a day for soft formed bowel movements per day.   Please contact your cardiologist in regards to aspirin.

## 2017-04-27 ENCOUNTER — Emergency Department (HOSPITAL_COMMUNITY): Payer: PPO

## 2017-04-27 ENCOUNTER — Encounter (HOSPITAL_COMMUNITY): Payer: Self-pay

## 2017-04-27 ENCOUNTER — Inpatient Hospital Stay (HOSPITAL_COMMUNITY)
Admission: EM | Admit: 2017-04-27 | Discharge: 2017-04-30 | DRG: 872 | Disposition: A | Discharge status: Home / Self Care | Payer: PPO | Attending: Internal Medicine | Admitting: Internal Medicine

## 2017-04-27 DIAGNOSIS — E1159 Type 2 diabetes mellitus with other circulatory complications: Secondary | ICD-10-CM

## 2017-04-27 DIAGNOSIS — I252 Old myocardial infarction: Secondary | ICD-10-CM | POA: Diagnosis not present

## 2017-04-27 DIAGNOSIS — Z961 Presence of intraocular lens: Secondary | ICD-10-CM | POA: Diagnosis not present

## 2017-04-27 DIAGNOSIS — K7290 Hepatic failure, unspecified without coma: Secondary | ICD-10-CM | POA: Diagnosis not present

## 2017-04-27 DIAGNOSIS — E119 Type 2 diabetes mellitus without complications: Secondary | ICD-10-CM

## 2017-04-27 DIAGNOSIS — IMO0002 Reserved for concepts with insufficient information to code with codable children: Secondary | ICD-10-CM

## 2017-04-27 DIAGNOSIS — I5032 Chronic diastolic (congestive) heart failure: Secondary | ICD-10-CM | POA: Diagnosis not present

## 2017-04-27 DIAGNOSIS — A419 Sepsis, unspecified organism: Secondary | ICD-10-CM | POA: Diagnosis not present

## 2017-04-27 DIAGNOSIS — I712 Thoracic aortic aneurysm, without rupture: Secondary | ICD-10-CM | POA: Diagnosis present

## 2017-04-27 DIAGNOSIS — Z9842 Cataract extraction status, left eye: Secondary | ICD-10-CM

## 2017-04-27 DIAGNOSIS — E876 Hypokalemia: Secondary | ICD-10-CM | POA: Diagnosis not present

## 2017-04-27 DIAGNOSIS — K7460 Unspecified cirrhosis of liver: Secondary | ICD-10-CM | POA: Diagnosis not present

## 2017-04-27 DIAGNOSIS — R509 Fever, unspecified: Secondary | ICD-10-CM | POA: Diagnosis not present

## 2017-04-27 DIAGNOSIS — E118 Type 2 diabetes mellitus with unspecified complications: Secondary | ICD-10-CM | POA: Diagnosis not present

## 2017-04-27 DIAGNOSIS — I8500 Esophageal varices without bleeding: Secondary | ICD-10-CM | POA: Diagnosis present

## 2017-04-27 DIAGNOSIS — K7581 Nonalcoholic steatohepatitis (NASH): Secondary | ICD-10-CM | POA: Diagnosis present

## 2017-04-27 DIAGNOSIS — D696 Thrombocytopenia, unspecified: Secondary | ICD-10-CM

## 2017-04-27 DIAGNOSIS — Z79899 Other long term (current) drug therapy: Secondary | ICD-10-CM | POA: Diagnosis not present

## 2017-04-27 DIAGNOSIS — K766 Portal hypertension: Secondary | ICD-10-CM | POA: Diagnosis not present

## 2017-04-27 DIAGNOSIS — I2510 Atherosclerotic heart disease of native coronary artery without angina pectoris: Secondary | ICD-10-CM | POA: Diagnosis not present

## 2017-04-27 DIAGNOSIS — I8510 Secondary esophageal varices without bleeding: Secondary | ICD-10-CM

## 2017-04-27 DIAGNOSIS — F17210 Nicotine dependence, cigarettes, uncomplicated: Secondary | ICD-10-CM | POA: Diagnosis present

## 2017-04-27 DIAGNOSIS — Z955 Presence of coronary angioplasty implant and graft: Secondary | ICD-10-CM

## 2017-04-27 DIAGNOSIS — E785 Hyperlipidemia, unspecified: Secondary | ICD-10-CM | POA: Diagnosis present

## 2017-04-27 DIAGNOSIS — R402441 Other coma, without documented Glasgow coma scale score, or with partial score reported, in the field [EMT or ambulance]: Secondary | ICD-10-CM | POA: Diagnosis not present

## 2017-04-27 DIAGNOSIS — Z794 Long term (current) use of insulin: Secondary | ICD-10-CM | POA: Diagnosis not present

## 2017-04-27 DIAGNOSIS — E1165 Type 2 diabetes mellitus with hyperglycemia: Secondary | ICD-10-CM

## 2017-04-27 DIAGNOSIS — R400 Somnolence: Secondary | ICD-10-CM | POA: Diagnosis not present

## 2017-04-27 DIAGNOSIS — K7030 Alcoholic cirrhosis of liver without ascites: Secondary | ICD-10-CM

## 2017-04-27 DIAGNOSIS — R9431 Abnormal electrocardiogram [ECG] [EKG]: Secondary | ICD-10-CM | POA: Diagnosis not present

## 2017-04-27 DIAGNOSIS — I7121 Aneurysm of the ascending aorta, without rupture: Secondary | ICD-10-CM

## 2017-04-27 DIAGNOSIS — J439 Emphysema, unspecified: Secondary | ICD-10-CM | POA: Diagnosis not present

## 2017-04-27 DIAGNOSIS — K7682 Hepatic encephalopathy: Secondary | ICD-10-CM

## 2017-04-27 DIAGNOSIS — R4182 Altered mental status, unspecified: Secondary | ICD-10-CM | POA: Diagnosis not present

## 2017-04-27 LAB — URINALYSIS, ROUTINE W REFLEX MICROSCOPIC
BILIRUBIN URINE: NEGATIVE
Bacteria, UA: NONE SEEN
GLUCOSE, UA: NEGATIVE mg/dL
Ketones, ur: NEGATIVE mg/dL
Leukocytes, UA: NEGATIVE
NITRITE: NEGATIVE
PH: 6 (ref 5.0–8.0)
Protein, ur: NEGATIVE mg/dL
SPECIFIC GRAVITY, URINE: 1.016 (ref 1.005–1.030)

## 2017-04-27 LAB — MAGNESIUM: MAGNESIUM: 1.4 mg/dL — AB (ref 1.7–2.4)

## 2017-04-27 LAB — CBC WITH DIFFERENTIAL/PLATELET
Basophils Absolute: 0 10*3/uL (ref 0.0–0.1)
Basophils Relative: 0 %
EOS ABS: 0.1 10*3/uL (ref 0.0–0.7)
Eosinophils Relative: 1 %
HCT: 37 % — ABNORMAL LOW (ref 39.0–52.0)
Hemoglobin: 12.8 g/dL — ABNORMAL LOW (ref 13.0–17.0)
LYMPHS ABS: 0.8 10*3/uL (ref 0.7–4.0)
Lymphocytes Relative: 6 %
MCH: 33.7 pg (ref 26.0–34.0)
MCHC: 34.6 g/dL (ref 30.0–36.0)
MCV: 97.4 fL (ref 78.0–100.0)
Monocytes Absolute: 1.1 10*3/uL — ABNORMAL HIGH (ref 0.1–1.0)
Monocytes Relative: 8 %
NEUTROS ABS: 11.4 10*3/uL — AB (ref 1.7–7.7)
NEUTROS PCT: 85 %
PLATELETS: ADEQUATE 10*3/uL (ref 150–400)
RBC: 3.8 MIL/uL — AB (ref 4.22–5.81)
RDW: 14.6 % (ref 11.5–15.5)
WBC: 13.5 10*3/uL — AB (ref 4.0–10.5)

## 2017-04-27 LAB — COMPREHENSIVE METABOLIC PANEL
ALT: 52 U/L (ref 17–63)
ANION GAP: 7 (ref 5–15)
AST: 75 U/L — ABNORMAL HIGH (ref 15–41)
Albumin: 2.4 g/dL — ABNORMAL LOW (ref 3.5–5.0)
Alkaline Phosphatase: 165 U/L — ABNORMAL HIGH (ref 38–126)
BUN: 7 mg/dL (ref 6–20)
CALCIUM: 8.9 mg/dL (ref 8.9–10.3)
CHLORIDE: 104 mmol/L (ref 101–111)
CO2: 24 mmol/L (ref 22–32)
CREATININE: 0.83 mg/dL (ref 0.61–1.24)
Glucose, Bld: 199 mg/dL — ABNORMAL HIGH (ref 65–99)
Potassium: 2.8 mmol/L — ABNORMAL LOW (ref 3.5–5.1)
SODIUM: 135 mmol/L (ref 135–145)
Total Bilirubin: 3 mg/dL — ABNORMAL HIGH (ref 0.3–1.2)
Total Protein: 7 g/dL (ref 6.5–8.1)

## 2017-04-27 LAB — PROTIME-INR
INR: 1.64
Prothrombin Time: 19.2 seconds — ABNORMAL HIGH (ref 11.4–15.2)

## 2017-04-27 LAB — AMMONIA: AMMONIA: 87 umol/L — AB (ref 9–35)

## 2017-04-27 LAB — I-STAT CG4 LACTIC ACID, ED: LACTIC ACID, VENOUS: 1.58 mmol/L (ref 0.5–1.9)

## 2017-04-27 LAB — POC OCCULT BLOOD, ED: FECAL OCCULT BLD: POSITIVE — AB

## 2017-04-27 LAB — I-STAT TROPONIN, ED: Troponin i, poc: 0.01 ng/mL (ref 0.00–0.08)

## 2017-04-27 MED ORDER — ACETAMINOPHEN 325 MG PO TABS: 650.0000 mg | ORAL_TABLET | Freq: Four times a day (QID) | ORAL | Status: DC | PRN

## 2017-04-27 MED ORDER — VANCOMYCIN HCL 10 G IV SOLR
1250.0000 mg | Freq: Two times a day (BID) | INTRAVENOUS | Status: DC
  Administered 2017-04-28 – 2017-04-29 (×3): 1250 mg via INTRAVENOUS
  Filled 2017-04-27 (×4): qty 1250

## 2017-04-27 MED ORDER — PAROXETINE HCL 10 MG PO TABS
10.0000 mg | ORAL_TABLET | Freq: Every day | ORAL | Status: DC
  Administered 2017-04-28 – 2017-04-30 (×3): 10 mg via ORAL
  Filled 2017-04-27 (×3): qty 1

## 2017-04-27 MED ORDER — LACTULOSE 10 GM/15ML PO SOLN
10.0000 g | Freq: Every day | ORAL | Status: DC
  Administered 2017-04-28: 10 g via ORAL
  Filled 2017-04-27: qty 15

## 2017-04-27 MED ORDER — PIPERACILLIN-TAZOBACTAM 3.375 G IVPB 30 MIN
3.3750 g | Freq: Once | INTRAVENOUS | Status: AC
  Administered 2017-04-27: 3.375 g via INTRAVENOUS
  Filled 2017-04-27: qty 50

## 2017-04-27 MED ORDER — VANCOMYCIN HCL 10 G IV SOLR
1500.0000 mg | Freq: Once | INTRAVENOUS | Status: AC
  Administered 2017-04-28: 1500 mg via INTRAVENOUS
  Filled 2017-04-27 (×2): qty 1500

## 2017-04-27 MED ORDER — ACETAMINOPHEN 650 MG RE SUPP
650.0000 mg | Freq: Once | RECTAL | Status: AC
  Administered 2017-04-27: 650 mg via RECTAL
  Filled 2017-04-27: qty 1

## 2017-04-27 MED ORDER — MAGNESIUM SULFATE 2 GM/50ML IV SOLN
2.0000 g | Freq: Once | INTRAVENOUS | Status: AC
  Administered 2017-04-27: 2 g via INTRAVENOUS
  Filled 2017-04-27: qty 50

## 2017-04-27 MED ORDER — INSULIN ASPART 100 UNIT/ML ~~LOC~~ SOLN: 0.0000 [IU] | SUBCUTANEOUS | Status: DC

## 2017-04-27 MED ORDER — ONDANSETRON HCL 4 MG PO TABS: 4.0000 mg | ORAL_TABLET | Freq: Four times a day (QID) | ORAL | Status: DC | PRN

## 2017-04-27 MED ORDER — ENOXAPARIN SODIUM 40 MG/0.4ML ~~LOC~~ SOLN: 40.0000 mg | SUBCUTANEOUS | Status: DC

## 2017-04-27 MED ORDER — ADULT MULTIVITAMIN W/MINERALS CH
1.0000 | ORAL_TABLET | Freq: Every day | ORAL | Status: DC
  Administered 2017-04-28 – 2017-04-30 (×3): 1 via ORAL
  Filled 2017-04-27 (×3): qty 1

## 2017-04-27 MED ORDER — VANCOMYCIN HCL IN DEXTROSE 1-5 GM/200ML-% IV SOLN
1000.0000 mg | Freq: Once | INTRAVENOUS | Status: AC
  Administered 2017-04-27: 1000 mg via INTRAVENOUS
  Filled 2017-04-27: qty 200

## 2017-04-27 MED ORDER — ONDANSETRON HCL 4 MG/2ML IJ SOLN: 4.0000 mg | Freq: Four times a day (QID) | INTRAMUSCULAR | Status: DC | PRN

## 2017-04-27 MED ORDER — SODIUM CHLORIDE 0.9 % IV BOLUS (SEPSIS)
1000.0000 mL | Freq: Once | INTRAVENOUS | Status: AC
  Administered 2017-04-27: 1000 mL via INTRAVENOUS

## 2017-04-27 MED ORDER — SODIUM CHLORIDE 0.9 % IV SOLN
INTRAVENOUS | Status: DC
  Administered 2017-04-27 – 2017-04-29 (×5): via INTRAVENOUS

## 2017-04-27 MED ORDER — PIPERACILLIN-TAZOBACTAM 3.375 G IVPB
3.3750 g | Freq: Three times a day (TID) | INTRAVENOUS | Status: DC
  Administered 2017-04-28 – 2017-04-29 (×5): 3.375 g via INTRAVENOUS
  Filled 2017-04-27 (×5): qty 50

## 2017-04-27 MED ORDER — ACETAMINOPHEN 650 MG RE SUPP: 650.0000 mg | Freq: Four times a day (QID) | RECTAL | Status: DC | PRN

## 2017-04-27 MED ORDER — IBUPROFEN 200 MG PO TABS: 400.0000 mg | ORAL_TABLET | ORAL | Status: DC | PRN

## 2017-04-27 MED ORDER — ATORVASTATIN CALCIUM 10 MG PO TABS
10.0000 mg | ORAL_TABLET | Freq: Every day | ORAL | Status: DC
  Administered 2017-04-28 – 2017-04-30 (×3): 10 mg via ORAL
  Filled 2017-04-27 (×3): qty 1

## 2017-04-27 MED ORDER — INSULIN ASPART 100 UNIT/ML ~~LOC~~ SOLN
0.0000 [IU] | Freq: Three times a day (TID) | SUBCUTANEOUS | Status: DC
  Administered 2017-04-28: 5 [IU] via SUBCUTANEOUS
  Administered 2017-04-28 – 2017-04-29 (×3): 3 [IU] via SUBCUTANEOUS

## 2017-04-27 MED ORDER — POTASSIUM CHLORIDE 10 MEQ/100ML IV SOLN
10.0000 meq | INTRAVENOUS | Status: AC
Start: 2017-04-27 — End: 2017-04-28
  Administered 2017-04-27 – 2017-04-28 (×3): 10 meq via INTRAVENOUS
  Filled 2017-04-27 (×2): qty 100

## 2017-04-27 MED ORDER — FAMOTIDINE 20 MG PO TABS
20.0000 mg | ORAL_TABLET | Freq: Two times a day (BID) | ORAL | Status: DC
  Administered 2017-04-28 – 2017-04-30 (×5): 20 mg via ORAL
  Filled 2017-04-27 (×5): qty 1

## 2017-04-27 NOTE — ED Notes (Signed)
Main lab to add on magnesium

## 2017-04-27 NOTE — H&P (Signed)
History and Physical    Adam Butler HFW:[id number] DOB: [birth year] DOA: 04/27/2017  PCP: [name], NP  Patient coming from: Home  I have personally briefly reviewed patient's old medical records in [place]  Chief Complaint: AMS, fever  HPI: Adam Butler is a 61 y.o. male with medical history significant of Cirrhosis likely from NASH, esophageal varices, rectal varices, prior admissions for sepsis with positive BCx in 02/2016 and 02/2017.  Patient presents to the ED with AMS, fever of 102 at home.  Wife brings him in to the ED.   ED Course: Tm 027.7  SBP ~412 systolic, given 1L NS bolus.  WBC 13.5k.  Mental status slightly improved, no localizing findings or symptoms.  Hospitalist asked to admit.   Review of Systems: As per HPI otherwise 10 point review of systems negative.   Past Medical History:  Diagnosis Date  . Arthritis   . Cholelithiasis   . Cirrhosis ([place]) 2011   Cryptogenic, Likely NASH. Family/pt deny EtOH. HCV, HBV, HAV negative. ANA negative. AMA positive. Ascites 12/11  . Coronary artery disease    Inferior MI 12/11; LHC with occluded mid CFX and 80% proximal RCA. EF 55%. He had 3.0 x 28 vision BMS to CFX  . Diastolic CHF, acute ([place])    Echo 12/11 with ef 50-55% and mild LVH. EF 55% by LV0gram in 12/11  . Esophageal varices ([place]) 2011, 2013   no hx acute variceal bleed  . Hepatic encephalopathy ([place]) 2011, 12/2013  . Hyperlipemia   . Myocardial infarction [facility]) ? 2012  . PFO (patent foramen ovale): Per TEE 04/20/2015 04/20/2015  . Portal hypertension ([place])   . Rectal varices   . S/P coronary artery stent placement 06/2010  . SVT (supraventricular tachycardia) ([place])    1/12: appeared to be an ectopic atrial tachycardia. Required DCCV with hemodynamic instability  . Type II diabetes mellitus ([place]) 2011    Past Surgical History:  Procedure Laterality Date  . APPENDECTOMY    . CARDIAC CATHETERIZATION  07/01/2010   BMS to CFX.  Marland Kitchen  CATARACT EXTRACTION W/PHACO Left 04/28/2016   Procedure: CATARACT EXTRACTION PHACO AND INTRAOCULAR LENS PLACEMENT (IOC);  Surgeon: Leandrew Koyanagi, MD;  Location: [facility] ORS;  Service: Ophthalmology;  Laterality: Left;  Lot# [id number] H [place]: 05:31.7 AP%: 28.2 CDE: 93.48   . COLONOSCOPY  04/13/2012   Procedure: COLONOSCOPY;  Surgeon: Inda Castle, MD;  [facility] END[facility];  Service: Endoscopy;  Laterality: N/A;  . COLONOSCOPY WITH PROPOFOL N/A 03/22/2017   Procedure: COLONOSCOPY WITH PROPOFOL;  Surgeon: Doran Stabler, MD;  [facility]OS[facility];  Service: Gastroenterology;  Laterality: N/A;  . CORONARY ANGIOPLASTY    . CORONARY STENT PLACEMENT  06/30/2010   CFX   Distal        . ESOPHAGEAL BANDING N/A 01/01/2014   Procedure: ESOPHAGEAL BANDING;  Surgeon: Jerene Bears, MD;  Location: [place] ENDOSCOPY;  Service: Endoscopy;  Laterality: N/A;  . ESOPHAGOGASTRODUODENOSCOPY  04/13/2012   Procedure: ESOPHAGOGASTRODUODENOSCOPY (EGD);  Surgeon: Inda Castle, MD;  [facility];  Service: Endoscopy;  Laterality: N/A;  . ESOPHAGOGASTRODUODENOSCOPY N/A 01/01/2014   Procedure: ESOPHAGOGASTRODUODENOSCOPY (EGD);  Surgeon: Jerene Bears, MD;  Location: [facility] ENDOSCOPY;  Service: Endoscopy;  Laterality: N/A;  . ESOPHAGOGASTRODUODENOSCOPY (EGD) WITH PROPOFOL N/A 07/12/2016   Procedure: ESOPHAGOGASTRODUODENOSCOPY (EGD) WITH PROPOFOL;  Surgeon: Jerene Bears, MD;  [facility];  Service: Gastroenterology;  Laterality: N/A;  . LEFT HEART CATHETERIZATION WITH CORONARY ANGIOGRAM N/A 10/15/2014  Procedure: LEFT HEART CATHETERIZATION WITH CORONARY ANGIOGRAM;  Surgeon: Peter M Martinique, MD;  Location[facility] C[facility] LAB;  Service: Cardiovascular;  Laterality: N/A;  . orif r leg    . TEE WITHOUT CARDIOVERSION N/A 04/20/2015   Procedure: TRANSESOPHAGEAL ECHOCARDIOGRAM (TEE);  Surgeon: Fay Records, MD;  Location: [facility] ENDOSCOPY;  Service: Cardiovascular;  Laterality: N/A;     reports that he has been smoking  E-cigarettes and Cigarettes.  He has a 9.00 pack-year smoking history. He has never used smokeless tobacco. He reports that he does not drink alcohol or use drugs.  Allergies  Allergen Reactions  . Isosorbide Nitrate Other (See Comments)    Nose bleeds    Family History  Problem Relation Age of Onset  . Heart attack Brother 14       MI  . Diabetes Brother   . Heart attack Father   . Diabetes Father   . COPD Father   . COPD Mother   . Heart attack Brother   . Stroke Neg Hx      Prior to Admission medications   Medication Sig Start Date End Date Taking? Authorizing Provider  Aspirin-Calcium Carbonate (BAYER WOMENS) [phone] MG TABS Take 81 mg by mouth daily.   Yes [provider]  atorvastatin (LIPITOR) 10 MG tablet TAKE 1 TABLET BY MOUTH ONCE DAILY Patient taking differently: TAKE 10 mg TABLET BY MOUTH ONCE DAILY 02/06/17  Yes [name], NP  cyanocobalamin 1000 MCG tablet Take 1,000 mcg by mouth daily. Vitamin B12   Yes [provider]  fluticasone (FLONASE) 50 MCG/ACT nasal spray Place 2 sprays into both nostrils 3 (three) times daily as needed for allergies or rhinitis (congestion).   Yes [provider]  furosemide (LASIX) 40 MG tablet Take 1 tablet (40 mg total) by mouth every morning. 05/30/16  Yes Esterwood, Amy S, PA-C  glipiZIDE (GLUCOTROL) 10 MG tablet TAKE 1 TABLET BY MOUTH TWICE DAILY BEFORE  A  MEAL Patient taking differently: TAKE 10 mg TABLET BY MOUTH TWICE DAILY BEFORE  A  MEAL 03/27/17  Yes Baity, Coralie Keens, NP  insulin regular (NOVOLIN R,HUMULIN R) 100 units/mL injection Inject 25-80 Units into the skin 3 (three) times daily as needed for high blood sugar (CBG >150).   Yes [provider]  lactulose (CHRONULAC) 10 GM/15ML solution Take 15 mLs by mouth at bedtime. 09/28/15  Yes [provider]  Multiple Vitamin (MULTIVITAMIN WITH MINERALS) TABS tablet Take 1 tablet by mouth daily.   Yes [provider]  nadolol  (CORGARD) 40 MG tablet Take 1.5 tablets (60 mg) by mouth once daily 01/16/17  Yes Pyrtle, Lajuan Lines, MD  nitroGLYCERIN (NITROSTAT) 0.4 MG SL tablet Place 1 tablet (0.4 mg total) under the tongue every 5 (five) minutes as needed for chest pain (chest pain). 07/19/16  Yes [name], NP  PARoxetine (PAXIL) 10 MG tablet Take 1 tablet (10 mg total) by mouth daily. 03/30/17  Yes [name], NP  ranitidine (ZANTAC) 150 MG tablet Take 150 mg by mouth See admin instructions. Take 1 tablet (150 mg) by mouth every morning, may also take 1 tablet at night as needed for acid reflux   Yes [provider]  spironolactone (ALDACTONE) 25 MG tablet Take 0.5 tablets (12.5 mg total) by mouth daily. 05/30/16  Yes Alfredia Ferguson, PA-C    Physical Exam: Vitals:   04/27/17 2100 04/27/17 2115 04/27/17 2130 04/27/17 2145  BP: (!) 103/50 (!) 96/45 (!) 98/47 99/79  Pulse: 66 69 67 71  Resp: 19 (!) 24 (!) 25 (!) 21  Temp:      TempSrc:      SpO2: 95% 95% 93% 96%  Weight:      Height:        Constitutional: NAD, calm, comfortable Eyes: PERRL, lids and conjunctivae normal ENMT: Mucous membranes are moist. Posterior pharynx clear of any exudate or lesions.Normal dentition.  Neck: normal, supple, no masses, no thyromegaly Respiratory: clear to auscultation bilaterally, no wheezing, no crackles. Normal respiratory effort. No accessory muscle use.  Cardiovascular: Regular rate and rhythm, no murmurs / rubs / gallops. No extremity edema. 2+ pedal pulses. No carotid bruits.  Abdomen: no tenderness, no masses palpated. No hepatosplenomegaly. Bowel sounds positive.  Musculoskeletal: no clubbing / cyanosis. No joint deformity upper and lower extremities. Good ROM, no contractures. Normal muscle tone.  Skin: no rashes, lesions, ulcers. No induration Neurologic: CN 2-12 grossly intact. Sensation intact, DTR normal. Strength 5/5 in all 4.  Psychiatric: Normal judgment and insight. Alert and oriented x 3. Normal  mood.    Labs on Admission: I have personally reviewed following labs and imaging studies  CBC:  Recent Labs Lab 04/27/17 1900  WBC 13.5*  NEUTROABS 11.4*  HGB 12.8*  HCT 37.0*  MCV 97.4  PLT PLATELET CLUMPS NOTED ON SMEAR, COUNT APPEARS ADEQUATE   Basic Metabolic Panel:  Recent Labs Lab 04/27/17 1900  NA 135  K 2.8*  CL 104  CO2 24  GLUCOSE 199*  BUN 7  CREATININE 0.83  CALCIUM 8.9  MG 1.4*   GFR: Estimated Creatinine Clearance: 118.6 mL/min (by C-G formula based on SCr of 0.83 mg/dL). Liver Function Tests:  Recent Labs Lab 04/27/17 1900  AST 75*  ALT 52  ALKPHOS 165*  BILITOT 3.0*  PROT 7.0  ALBUMIN 2.4*   No results for input(s): LIPASE, AMYLASE in the last 168 hours.  Recent Labs Lab 04/27/17 1902  AMMONIA 87*   Coagulation Profile:  Recent Labs Lab 04/27/17 1900  INR 1.64   Cardiac Enzymes: No results for input(s): CKTOTAL, CKMB, CKMBINDEX, TROPONINI in the last 168 hours. BNP (last 3 results) No results for input(s): PROBNP in the last 8760 hours. HbA1C: No results for input(s): HGBA1C in the last 72 hours. CBG: No results for input(s): GLUCAP in the last 168 hours. Lipid Profile: No results for input(s): CHOL, HDL, LDLCALC, TRIG, CHOLHDL, LDLDIRECT in the last 72 hours. Thyroid Function Tests: No results for input(s): TSH, T4TOTAL, FREET4, T3FREE, THYROIDAB in the last 72 hours. Anemia Panel: No results for input(s): VITAMINB12, FOLATE, FERRITIN, TIBC, IRON, RETICCTPCT in the last 72 hours. Urine analysis:    Component Value Date/Time   COLORURINE AMBER (A) 04/27/2017 2015   APPEARANCEUR CLEAR 04/27/2017 2015   LABSPEC 1.016 04/27/2017 2015   PHURINE 6.0 04/27/2017 2015   GLUCOSEU NEGATIVE 04/27/2017 2015   HGBUR SMALL (A) 04/27/2017 2015   BILIRUBINUR NEGATIVE 04/27/2017 2015   KETONESUR NEGATIVE 04/27/2017 2015   PROTEINUR NEGATIVE 04/27/2017 2015   UROBILINOGEN 0.2 05/14/2015 1813   NITRITE NEGATIVE 04/27/2017 2015    LEUKOCYTESUR NEGATIVE 04/27/2017 2015    Radiological Exams on Admission: Ct Head Wo Contrast  Result Date: 04/27/2017 CLINICAL DATA:  Fever and altered level of consciousness. EXAM: CT HEAD WITHOUT CONTRAST TECHNIQUE: Contiguous axial images were obtained from the base of the skull through the vertex without intravenous contrast. COMPARISON:  02/01/2017 FINDINGS: Brain: Stable chronic small vessel ischemia. Chronic focus of encephalomalacia involving the right  occipital lobe. No large vascular territory infarct. No acute intracranial hemorrhage, mass or midline shift. No extra-axial fluid collections. Vascular: No hyperdense vessels. Moderate atherosclerosis of the cavernous sinus carotids and left vertebral arteries. Skull: Incomplete posterior arch of C1, developmental in appearance. Sinuses/Orbits: No acute finding. Mild right maxillary sinus atelectasis. Other: None IMPRESSION: 1. Chronic stable small vessel ischemic disease. 2. Chronic right occipital encephalomalacia. 3. No acute intracranial abnormality. Electronically Signed   By: Ashley Royalty M.D.   On: 04/27/2017 20:03   Dg Chest Port 1 View  Result Date: 04/27/2017 CLINICAL DATA:  Fever EXAM: PORTABLE CHEST 1 VIEW COMPARISON:  03/02/2017 dating back to 01/31/2017 FINDINGS: The cardiopericardial silhouette is mildly enlarged, some which is likely due to portable technique and low lung volumes. The aorta is not aneurysmal. Low lung volumes without pneumonic consolidation, effusion or pneumothorax. Rounded densities about the right hilum likely reflect vessels seen on end and central vascular congestion. Pulmonary nodules are not entirely excluded but believed less likely. No pneumonic consolidation. No effusion or pneumothorax. The visualized skeletal structures are unremarkable. IMPRESSION: 1. Low lung volumes with crowding of interstitial lung markings. Mild central vascular congestion. 2. Nodular densities about the right hilum are new and  likely reflect pulmonary vessels seen on end. As pulmonary nodules are not entirely excluded, follow-up radiographs with PA and lateral views or chest CT may help exclude pulmonary nodules. Electronically Signed   By: Ashley Royalty M.D.   On: 04/27/2017 20:00    EKG: Independently reviewed.  Assessment/Plan Principal Problem:   Sepsis ([place]) Active Problems:   DIASTOLIC HEART FAILURE, CHRONIC   Esophageal varices without bleeding (H[facility])   Liver cirrhosis secondary to NASH ([place])   Type 2 diabetes mellitus ([place])    1. Sepsis - unknown source again, note that BCx ultimately came back positive last 2 times he presented like this 1. Continue zosyn / vanc for now 2. BCx pending 3. IVF: 1L bolus in ED, 125 cc/hr NS and will hold diuretics 4. Ibuprofen PRN fever 2. Chronic diastolic CHF - 1. Hold diuretics as above 2. Holding beta blocker due to Sepsis 3. NASH - 1. Continue lactulose 4. DM2 - 1. Hold home hypoglycemics 2. Mod scale SSI AC 5. Hypokalemia and hypomagnesemia - replacing 6. Thrombocytopenia - known chronic thrombocytopenia, platelets clumped today though, presumably he will be less than 100 count again  DVT prophylaxis: SCDs - due to h/o varices, thrombocytopenia Code Status: Full Family Communication: Wife at bedside Disposition Plan: Home after admit Consults called: None Admission status: Admit to inpatient - inpatient status for treatment of sepsis   Etta Quill DO [facility] Pager [phone]  If 7AM-7PM, please contact day team taking care of patient [email or web address] Password [email or web address]  04/27/2017, 10:05 PM

## 2017-04-27 NOTE — Progress Notes (Signed)
Pharmacy Antibiotic Note  Adam Butler is a 61 y.o. male admitted on 04/27/2017 with sepsis.  Pharmacy has been consulted for vancomycin and zosyn dosing.Temp 102.1, WBC 13.5, LA 1.58, Scr 0.83.  Plan: Vancomycin 1080m IV X1 recieved in ED. Will give 15015mIV X1 to ensure full load of vancomycin.Then Vancomycin 125051mV every 12 hours.  Zosyn 3.375g IV X1 (30 min infusion) received in ED, then Zosyn 3.375g IV every 8 hours (4 hour infusion). Monitor for Scr, c/s, clinical resolution. F/u de-escalation plan/LOT, vancomycin trough as indicated   Height: 5' 7"  (170.2 cm) Weight: 276 lb (125.2 kg) IBW/kg (Calculated) : 66.1  Temp (24hrs), Avg:102.1 F (38.9 C), Min:102.1 F (38.9 C), Max:102.1 F (38.9 C)   Recent Labs Lab 04/27/17 1900 04/27/17 1915  WBC 13.5*  --   CREATININE 0.83  --   LATICACIDVEN  --  1.58    Estimated Creatinine Clearance: 118.6 mL/min (by C-G formula based on SCr of 0.83 mg/dL).    Allergies  Allergen Reactions  . Isosorbide Nitrate Other (See Comments)    Nose bleeds    Antimicrobials this admission: 10/18 vancomycin >>  10/18 Zosyn>>    Microbiology results: 10/18 BCx:  10/18 UCx:     Thank you for allowing pharmacy to be a part of this patient's care.  WhiJerrye NobleharmD Candidate 04/27/2017 10:00 PM

## 2017-04-27 NOTE — ED Provider Notes (Signed)
[facility] EMERGENCY DEPARTMENT Provider Note   CSN: [id number] Arrival date & time: 04/27/17  1813     History   Chief Complaint Chief Complaint  Patient presents with  . Fever    HPI Adam Butler is a 61 y.o. male.  HPI Level 5 caveat due to altered mental status.  History of cryptogenic cirrhosis complicated by hepatic encephalopathy and rectal varices. He also has a history of diabetes, CAD, diastolic heart failure. Per EMS, patient has been sleeping in his recliner all day, was subsequently difficult to wake by family.was noted to have fever of 101.6 Fahrenheit and was very altered.   Past Medical History:  Diagnosis Date  . Arthritis   . Cholelithiasis   . Cirrhosis ([place]) 2011   Cryptogenic, Likely NASH. Family/pt deny EtOH. HCV, HBV, HAV negative. ANA negative. AMA positive. Ascites 12/11  . Coronary artery disease    Inferior MI 12/11; LHC with occluded mid CFX and 80% proximal RCA. EF 55%. He had 3.0 x 28 vision BMS to CFX  . Diastolic CHF, acute ([place])    Echo 12/11 with ef 50-55% and mild LVH. EF 55% by LV0gram in 12/11  . Esophageal varices ([place]) 2011, 2013   no hx acute variceal bleed  . Hepatic encephalopathy ([place]) 2011, 12/2013  . Hyperlipemia   . Myocardial infarction [facility]) ? 2012  . PFO (patent foramen ovale): Per TEE 04/20/2015 04/20/2015  . Portal hypertension ([place])   . Rectal varices   . S/P coronary artery stent placement 06/2010  . SVT (supraventricular tachycardia) ([place])    1/12: appeared to be an ectopic atrial tachycardia. Required DCCV with hemodynamic instability  . Type II diabetes mellitus ([place]) 2011    Patient Active Problem List   Diagnosis Date Noted  . Hematochezia 03/21/2017  . OA (osteoarthritis) of knee 07/19/2016  . Severe obesity (BMI >= 40) ([place]) 07/19/2016  . Portal vein thrombosis   . GERD (gastroesophageal reflux disease) 09/29/2015  . Type 2 diabetes mellitus ([place]) 08/18/2015  . CAD in native  artery   . Liver cirrhosis secondary to NASH ([place]) 06/06/2015  . Tobacco abuse 05/04/2015  . PFO (patent foramen ovale): Per TEE 04/20/2015 04/20/2015  . Esophageal varices without bleeding ([place]) 08/16/2010  . HLD (hyperlipidemia) 08/02/2010  . DIASTOLIC HEART FAILURE, CHRONIC 08/02/2010    Past Surgical History:  Procedure Laterality Date  . APPENDECTOMY    . CARDIAC CATHETERIZATION  07/01/2010   BMS to CFX.  Marland Kitchen CATARACT EXTRACTION W/PHACO Left 04/28/2016   Procedure: CATARACT EXTRACTION PHACO AND INTRAOCULAR LENS PLACEMENT (IOC);  Surgeon: Leandrew Koyanagi, MD;  Location: [facility] ORS;  Service: Ophthalmology;  Laterality: Left;  Lot# [id number] H [place]: 05:31.7 AP%: 28.2 CDE: 93.48   . COLONOSCOPY  04/13/2012   Procedure: COLONOSCOPY;  Surgeon: Inda Castle, MD;  Location: [facility] ENDOSCOPY;  Service: Endoscopy;  Laterality: N/A;  . COLONOSCOPY WITH PROPOFOL N/A 03/22/2017   Procedure: COLONOSCOPY WITH PROPOFOL;  Surgeon: Doran Stabler, MD;  Location: [facility] ENDOSCOPY;  Service: Gastroenterology;  Laterality: N/A;  . CORONARY ANGIOPLASTY    . CORONARY STENT PLACEMENT  06/30/2010   CFX   Distal        . ESOPHAGEAL BANDING N/A 01/01/2014   Procedure: ESOPHAGEAL BANDING;  Surgeon: Jerene Bears, MD;  Location: [facility]OS[facility];  Service: Endoscopy;  Laterality: N/A;  . ESOPHAGOGASTRODUODENOSCOPY  04/13/2012   Procedure: ESOPHAGOGASTRODUODENOSCOPY (EGD);  Surgeon: Inda Castle, MD;  Location: [facility];  Service: Endoscopy;  Laterality: N/A;  . ESOPHAGOGASTRODUODENOSCOPY N/A 01/01/2014   Procedure: ESOPHAGOGASTRODUODENOSCOPY (EGD);  Surgeon: Jerene Bears, MD;  Location: [facility] ENDOSCOPY;  Service: Endoscopy;  Laterality: N/A;  . ESOPHAGOGASTRODUODENOSCOPY (EGD) WITH PROPOFOL N/A 07/12/2016   Procedure: ESOPHAGOGASTRODUODENOSCOPY (EGD) WITH PROPOFOL;  Surgeon: Jerene Bears, MD;  Location: [facility] ENDOSCOPY;  Service: Gastroenterology;  Laterality: N/A;  . LEFT HEART CATHETERIZATION WITH CORONARY ANGIOGRAM  N/A 10/15/2014   Procedure: LEFT HEART CATHETERIZATION WITH CORONARY ANGIOGRAM;  Surgeon: Peter M Martinique, MD;  Location: [facility] CATH LAB;  Service: Cardiovascular;  Laterality: N/A;  . orif r leg    . TEE WITHOUT CARDIOVERSION N/A 04/20/2015   Procedure: TRANSESOPHAGEAL ECHOCARDIOGRAM (TEE);  Surgeon: Fay Records, MD;  Location: [facility] ENDOSCOPY;  Service: Cardiovascular;  Laterality: N/A;       Home Medications    Prior to Admission medications   Medication Sig Start Date End Date Taking? Authorizing Provider  Aspirin-Calcium Carbonate (BAYER WOMENS) [phone] MG TABS Take 81 mg by mouth daily.   Yes [provider]  atorvastatin (LIPITOR) 10 MG tablet TAKE 1 TABLET BY MOUTH ONCE DAILY Patient taking differently: TAKE 10 mg TABLET BY MOUTH ONCE DAILY 02/06/17  Yes [name], NP  cyanocobalamin 1000 MCG tablet Take 1,000 mcg by mouth daily. Vitamin B12   Yes [provider]  fluticasone (FLONASE) 50 MCG/ACT nasal spray Place 2 sprays into both nostrils 3 (three) times daily as needed for allergies or rhinitis (congestion).   Yes [provider]  furosemide (LASIX) 40 MG tablet Take 1 tablet (40 mg total) by mouth every morning. 05/30/16  Yes Esterwood, Amy S, PA-C  glipiZIDE (GLUCOTROL) 10 MG tablet TAKE 1 TABLET BY MOUTH TWICE DAILY BEFORE  A  MEAL Patient taking differently: TAKE 10 mg TABLET BY MOUTH TWICE DAILY BEFORE  A  MEAL 03/27/17  Yes Baity, Coralie Keens, NP  insulin regular (NOVOLIN R,HUMULIN R) 100 units/mL injection Inject 25-80 Units into the skin 3 (three) times daily as needed for high blood sugar (CBG >150).   Yes [provider]  lactulose (CHRONULAC) 10 GM/15ML solution Take 15 mLs by mouth at bedtime. 09/28/15  Yes [provider]  Multiple Vitamin (MULTIVITAMIN WITH MINERALS) TABS tablet Take 1 tablet by mouth daily.   Yes [provider]  nadolol (CORGARD) 40 MG tablet Take 1.5 tablets (60 mg) by mouth once daily 01/16/17  Yes  Pyrtle, Lajuan Lines, MD  nitroGLYCERIN (NITROSTAT) 0.4 MG SL tablet Place 1 tablet (0.4 mg total) under the tongue every 5 (five) minutes as needed for chest pain (chest pain). 07/19/16  Yes [name], NP  PARoxetine (PAXIL) 10 MG tablet Take 1 tablet (10 mg total) by mouth daily. 03/30/17  Yes [name], NP  ranitidine (ZANTAC) 150 MG tablet Take 150 mg by mouth See admin instructions. Take 1 tablet (150 mg) by mouth every morning, may also take 1 tablet at night as needed for acid reflux   Yes [provider]  spironolactone (ALDACTONE) 25 MG tablet Take 0.5 tablets (12.5 mg total) by mouth daily. 05/30/16  Yes Esterwood, Amy S, PA-C    Family History Family History  Problem Relation Age of Onset  . Heart attack Brother 86       MI  . Diabetes Brother   . Heart attack Father   . Diabetes Father   . COPD Father   . COPD Mother   . Heart attack Brother   . Stroke Neg Hx  Social History Social History  Substance Use Topics  . Smoking status: Current Every Day Smoker    Packs/day: 0.25    Years: 36.00    Types: E-cigarettes, Cigarettes  . Smokeless tobacco: Never Used     Comment: uses vapor cigarettes (2016 ); I smoke about 4 cigarettes a day  . Alcohol use No     Allergies   Isosorbide nitrate   Review of Systems Review of Systems  Unable to perform ROS: Mental status change     Physical Exam Updated Vital Signs BP 99/79   Pulse 71   Temp (!) 102.1 F (38.9 C) (Rectal)   Resp (!) 21   Ht 5' 7"  (1.702 m)   Wt 125.2 kg (276 lb)   SpO2 96%   BMI 43.23 kg/m   Physical Exam Physical Exam  Nursing note and vitals reviewed. Constitutional: Somnolent, arouses to voice/touch, acutely ill appearing Head: Normocephalic and atraumatic.  Mouth/Throat: Oropharynx is clear and dry.  Neck:  Neck supple.  Cardiovascular: Normal rate and regular rhythm.   Pulmonary/Chest: Effort normal and breath sounds normal.  Abdominal: Soft. There is moderate  distension. There is no tenderness. There is no rebound and no guarding.  Musculoskeletal: No deformity.  Neurological: somnolent, oriented to self and place, does not open eyes to command, no resting facial droop, slow fluent speech, moves all extremities to command Skin: Skin is warm and dry.  Psychiatric: Cooperative   ED Treatments / Results  Labs (all labs ordered are listed, but only abnormal results are displayed) Labs Reviewed  COMPREHENSIVE METABOLIC PANEL - Abnormal; Notable for the following:       Result Value   Potassium 2.8 (*)    Glucose, Bld 199 (*)    Albumin 2.4 (*)    AST 75 (*)    Alkaline Phosphatase 165 (*)    Total Bilirubin 3.0 (*)    All other components within normal limits  CBC WITH DIFFERENTIAL/PLATELET - Abnormal; Notable for the following:    WBC 13.5 (*)    RBC 3.80 (*)    Hemoglobin 12.8 (*)    HCT 37.0 (*)    Neutro Abs 11.4 (*)    Monocytes Absolute 1.1 (*)    All other components within normal limits  URINALYSIS, ROUTINE W REFLEX MICROSCOPIC - Abnormal; Notable for the following:    Color, Urine AMBER (*)    Hgb urine dipstick SMALL (*)    Squamous Epithelial / LPF 0-5 (*)    All other components within normal limits  PROTIME-INR - Abnormal; Notable for the following:    Prothrombin Time 19.2 (*)    All other components within normal limits  AMMONIA - Abnormal; Notable for the following:    Ammonia 87 (*)    All other components within normal limits  MAGNESIUM - Abnormal; Notable for the following:    Magnesium 1.4 (*)    All other components within normal limits  POC OCCULT BLOOD, ED - Abnormal; Notable for the following:    Fecal Occult Bld POSITIVE (*)    All other components within normal limits  CULTURE, BLOOD (ROUTINE X 2)  CULTURE, BLOOD (ROUTINE X 2)  URINE CULTURE  I-STAT CG4 LACTIC ACID, ED  I-STAT TROPONIN, ED    EKG  EKG Interpretation  Date/Time:  Thursday April 27 2017 18:23:49 EDT Ventricular Rate:  71 PR  Interval:    QRS Duration: 130 QT Interval:  443 QTC Calculation: 482 R Axis:   -22 Text  Interpretation:  Sinus rhythm Nonspecific intraventricular conduction delay Inferior infarct, old No acute changes Confirmed by Brantley Stage [phone]) on 04/27/2017 7:34:23 PM       Radiology Ct Head Wo Contrast  Result Date: 04/27/2017 CLINICAL DATA:  Fever and altered level of consciousness. EXAM: CT HEAD WITHOUT CONTRAST TECHNIQUE: Contiguous axial images were obtained from the base of the skull through the vertex without intravenous contrast. COMPARISON:  02/01/2017 FINDINGS: Brain: Stable chronic small vessel ischemia. Chronic focus of encephalomalacia involving the right occipital lobe. No large vascular territory infarct. No acute intracranial hemorrhage, mass or midline shift. No extra-axial fluid collections. Vascular: No hyperdense vessels. Moderate atherosclerosis of the cavernous sinus carotids and left vertebral arteries. Skull: Incomplete posterior arch of C1, developmental in appearance. Sinuses/Orbits: No acute finding. Mild right maxillary sinus atelectasis. Other: None IMPRESSION: 1. Chronic stable small vessel ischemic disease. 2. Chronic right occipital encephalomalacia. 3. No acute intracranial abnormality. Electronically Signed   By: Ashley Royalty M.D.   On: 04/27/2017 20:03   Dg Chest Port 1 View  Result Date: 04/27/2017 CLINICAL DATA:  Fever EXAM: PORTABLE CHEST 1 VIEW COMPARISON:  03/02/2017 dating back to 01/31/2017 FINDINGS: The cardiopericardial silhouette is mildly enlarged, some which is likely due to portable technique and low lung volumes. The aorta is not aneurysmal. Low lung volumes without pneumonic consolidation, effusion or pneumothorax. Rounded densities about the right hilum likely reflect vessels seen on end and central vascular congestion. Pulmonary nodules are not entirely excluded but believed less likely. No pneumonic consolidation. No effusion or pneumothorax. The  visualized skeletal structures are unremarkable. IMPRESSION: 1. Low lung volumes with crowding of interstitial lung markings. Mild central vascular congestion. 2. Nodular densities about the right hilum are new and likely reflect pulmonary vessels seen on end. As pulmonary nodules are not entirely excluded, follow-up radiographs with PA and lateral views or chest CT may help exclude pulmonary nodules. Electronically Signed   By: Ashley Royalty M.D.   On: 04/27/2017 20:00    Procedures Procedures (including critical care time) Angiocath insertion Performed by: Forde Dandy  Consent: Verbal consent obtained. Risks and benefits: risks, benefits and alternatives were discussed Time out: Immediately prior to procedure a "time out" was called to verify the correct patient, procedure, equipment, support staff and site/side marked as required.  Preparation: Patient was prepped and draped in the usual sterile fashion.  Vein Location: Left AC  Ultrasound Guided  Gauge: 20G  Normal blood return and flush without difficulty Patient tolerance: Patient tolerated the procedure well with no immediate complications.   CRITICAL CARE Performed by: Forde Dandy   Total critical care time: 40 minutes  Critical care time was exclusive of separately billable procedures and treating other patients.  Critical care was necessary to treat or prevent imminent or life-threatening deterioration.  Critical care was time spent personally by me on the following activities: development of treatment plan with patient and/or surrogate as well as nursing, discussions with consultants, evaluation of patient's response to treatment, examination of patient, obtaining history from patient or surrogate, ordering and performing treatments and interventions, ordering and review of laboratory studies, ordering and review of radiographic studies, pulse oximetry and re-evaluation of patient's condition.  Medications Ordered in  ED Medications  vancomycin (VANCOCIN) IVPB 1000 mg/200 mL premix (1,000 mg Intravenous New Bag/Given 04/27/17 2057)  potassium chloride 10 mEq in 100 mL IVPB (not administered)  magnesium sulfate IVPB 2 g 50 mL (not administered)  acetaminophen (TYLENOL) suppository 650 mg (not  administered)  0.9 %  sodium chloride infusion (not administered)  insulin aspart (novoLOG) injection 0-9 Units (not administered)  sodium chloride 0.9 % bolus 1,000 mL (1,000 mLs Intravenous New Bag/Given 04/27/17 1922)  piperacillin-tazobactam (ZOSYN) IVPB 3.375 g (0 g Intravenous Stopped 04/27/17 2056)     Initial Impression / Assessment and Plan / ED Course  I have reviewed the triage vital signs and the nursing notes.  Pertinent labs & imaging results that were available during my care of the patient were reviewed by me and considered in my medical decision making (see chart for details).     Presents with altered mental status and fever. Presentation concerning for sepsis with hepatic encephalopathy. Febrile and with tachypnea. Somnolent, disoriented, but no focal neurological deficits.   Covered empirically with vancomycin and zosyn. Received 1L of IVF. Normal lactic acid. With leukocytosis of 13.5. UA not suggestive of infection. CXR clear. History of bacteremia before and suspect the same. No ascites on bedside ultrasound. No signs of acute GI bleeding on rectal exam to explain encephalopathy but occult positive.   Electrolyte derangements noted, repleted with potassium and magnesium.   Discussed with Dr. Alcario Drought who will admit for sepsis and encephalopathy.    Final Clinical Impressions(s) / ED Diagnoses   Final diagnoses:  Sepsis, due to unspecified organism [facility])  Hepatic encephalopathy [facility])    New Prescriptions New Prescriptions   No medications on file     Forde Dandy, MD 04/27/17 2212

## 2017-04-27 NOTE — ED Triage Notes (Signed)
Pt arrived via [facility] from home c/o Fever, AMS. Temporal temp 101.6. LKN 0630

## 2017-04-27 NOTE — ED Notes (Signed)
Patient transported to CT 

## 2017-04-28 ENCOUNTER — Inpatient Hospital Stay (HOSPITAL_COMMUNITY): Payer: PPO

## 2017-04-28 DIAGNOSIS — E1165 Type 2 diabetes mellitus with hyperglycemia: Secondary | ICD-10-CM

## 2017-04-28 DIAGNOSIS — R400 Somnolence: Secondary | ICD-10-CM

## 2017-04-28 DIAGNOSIS — D696 Thrombocytopenia, unspecified: Secondary | ICD-10-CM

## 2017-04-28 DIAGNOSIS — E118 Type 2 diabetes mellitus with unspecified complications: Secondary | ICD-10-CM

## 2017-04-28 LAB — RESPIRATORY PANEL BY PCR
Adenovirus: NOT DETECTED
Bordetella pertussis: NOT DETECTED
CORONAVIRUS 229E-RVPPCR: NOT DETECTED
CORONAVIRUS HKU1-RVPPCR: NOT DETECTED
CORONAVIRUS NL63-RVPPCR: NOT DETECTED
CORONAVIRUS OC43-RVPPCR: NOT DETECTED
Chlamydophila pneumoniae: NOT DETECTED
INFLUENZA A-RVPPCR: NOT DETECTED
Influenza B: NOT DETECTED
MYCOPLASMA PNEUMONIAE-RVPPCR: NOT DETECTED
Metapneumovirus: NOT DETECTED
PARAINFLUENZA VIRUS 1-RVPPCR: NOT DETECTED
PARAINFLUENZA VIRUS 4-RVPPCR: NOT DETECTED
Parainfluenza Virus 2: NOT DETECTED
Parainfluenza Virus 3: NOT DETECTED
Respiratory Syncytial Virus: NOT DETECTED
Rhinovirus / Enterovirus: NOT DETECTED

## 2017-04-28 LAB — COMPREHENSIVE METABOLIC PANEL
ALK PHOS: 124 U/L (ref 38–126)
ALT: 44 U/L (ref 17–63)
AST: 63 U/L — AB (ref 15–41)
Albumin: 2.1 g/dL — ABNORMAL LOW (ref 3.5–5.0)
Anion gap: 9 (ref 5–15)
BUN: 8 mg/dL (ref 6–20)
CHLORIDE: 103 mmol/L (ref 101–111)
CO2: 24 mmol/L (ref 22–32)
CREATININE: 0.79 mg/dL (ref 0.61–1.24)
Calcium: 8.2 mg/dL — ABNORMAL LOW (ref 8.9–10.3)
GFR calc Af Amer: >60 mL/min (ref >60)
GFR calc non Af Amer: >60 mL/min (ref >60)
Glucose, Bld: 97 mg/dL (ref 65–99)
Potassium: 2.8 mmol/L — ABNORMAL LOW (ref 3.5–5.1)
SODIUM: 136 mmol/L (ref 135–145)
Total Bilirubin: 2.8 mg/dL — ABNORMAL HIGH (ref 0.3–1.2)
Total Protein: 6.2 g/dL — ABNORMAL LOW (ref 6.5–8.1)

## 2017-04-28 LAB — CBC
HCT: 34.5 % — ABNORMAL LOW (ref 39.0–52.0)
HEMOGLOBIN: 11.7 g/dL — AB (ref 13.0–17.0)
MCH: 33.1 pg (ref 26.0–34.0)
MCHC: 33.9 g/dL (ref 30.0–36.0)
MCV: 97.7 fL (ref 78.0–100.0)
PLATELETS: 42 10*3/uL — AB (ref 150–400)
RBC: 3.53 MIL/uL — AB (ref 4.22–5.81)
RDW: 14.4 % (ref 11.5–15.5)
WBC: 11.5 10*3/uL — AB (ref 4.0–10.5)

## 2017-04-28 LAB — GLUCOSE, CAPILLARY
GLUCOSE-CAPILLARY: 165 mg/dL — AB (ref 65–99)
GLUCOSE-CAPILLARY: 248 mg/dL — AB (ref 65–99)
GLUCOSE-CAPILLARY: 188 mg/dL — AB (ref 65–99)
Glucose-Capillary: 95 mg/dL (ref 65–99)

## 2017-04-28 LAB — MAGNESIUM: Magnesium: 1.6 mg/dL — ABNORMAL LOW (ref 1.7–2.4)

## 2017-04-28 LAB — AMMONIA: Ammonia: 67 umol/L — ABNORMAL HIGH (ref 9–35)

## 2017-04-28 LAB — POTASSIUM: Potassium: 3.1 mmol/L — ABNORMAL LOW (ref 3.5–5.1)

## 2017-04-28 LAB — STREP PNEUMONIAE URINARY ANTIGEN: STREP PNEUMO URINARY ANTIGEN: NEGATIVE

## 2017-04-28 LAB — MRSA PCR SCREENING: MRSA by PCR: NEGATIVE

## 2017-04-28 LAB — LACTIC ACID, PLASMA: Lactic Acid, Venous: 1.4 mmol/L (ref 0.5–1.9)

## 2017-04-28 MED ORDER — POTASSIUM CHLORIDE 10 MEQ/100ML IV SOLN
10.0000 meq | INTRAVENOUS | Status: AC
  Administered 2017-04-28 (×6): 10 meq via INTRAVENOUS
  Filled 2017-04-28 (×5): qty 100

## 2017-04-28 MED ORDER — MAGNESIUM SULFATE 2 GM/50ML IV SOLN
2.0000 g | Freq: Once | INTRAVENOUS | Status: AC
  Administered 2017-04-28: 2 g via INTRAVENOUS
  Filled 2017-04-28: qty 50

## 2017-04-28 MED ORDER — POTASSIUM CHLORIDE 10 MEQ/100ML IV SOLN
INTRAVENOUS | Status: AC
  Filled 2017-04-28: qty 100

## 2017-04-28 MED ORDER — LACTULOSE 10 GM/15ML PO SOLN
20.0000 g | Freq: Every day | ORAL | Status: DC
  Administered 2017-04-28: 20 g via ORAL
  Filled 2017-04-28: qty 30

## 2017-04-28 NOTE — Progress Notes (Signed)
PROGRESS NOTE    Adam VICTORIAN  UTM:[id number] DOB: 06-26-1956 DOA: 04/27/2017 PCP: [name], NP   Brief Narrative:  61 y.o. WM PMHx Liver Cirrhosis likely from NASH, Esophageal Varices, Rectal varices, Chronic Diastolic CHF, CAD S/P stent placement, MI, SVT, Diabetes type 2 uncontrolled with complication. Prior admissions for sepsis with positive BCx in 02/2016 and 02/2017.   Presents to the ED with AMS, fever of 102 at home.  Wife brings him in to the ED.     ED Course: Tm 568.1  SBP ~275 systolic, given 1L NS bolus.  WBC 13.5k.  Mental status slightly improved, no localizing findings or symptoms.  Hospitalist asked to admit.      Subjective: 10/19 MAXIMUM TEMPERATURE last 24 hours 38.9C. A/O 3 (does not know why). Patient pleasant cooperative mildly confused. States not on home O2 or CPAP   Assessment & Plan:   Principal Problem:   Sepsis ([place]) Active Problems:   DIASTOLIC HEART FAILURE, CHRONIC   Esophageal varices without bleeding (HCC)   Liver cirrhosis secondary to NASH (HCC)   Type 2 diabetes mellitus (HCC)   Sepsis unspecified organism -continue empiric antibiotics while panculture pending. -normal saline 150 ml/hr - MAP goal> 65  Chronic Diastolic CHF -Unknown base weight. Patient states weighs himself daily but unsure of his weight (still slightly confused). -strict in and out -Daily weight -hold all diuretics/BP medication secondary to hypotension  Lung nodules? -PCXR hows possible lung nodules recommended chest CT for better diagnosis.  Diabetes type 2 uncontrolled with complication -1/[id number] A1c= 9.5  Liver Cirrhosis without ascites/[place] -monitor ammonia level   Recent Labs Lab 04/27/17 1902  AMMONIA 87*  -increase lactulose to 20 g daily (patient's home dose 10 g daily)   Altered mental status -Multifactorial sepsis, elevated ammonia,  Hypokalemia -Potassium goal> 4 -Potassium IV 60 mEq  Hypomagnesemia -magnesium goal>  2 -Magnesium IV 2 g  Thrombocytopenia(Baseline ~50) -No overt signs of bleeding however platelets currently 42. Hold all anticoagulation products.  -Monitor closely. Most likely secondary to sepsis.      DVT prophylaxis: sCD Code Status: full Family Communication: none Disposition Plan: TBD   Consultants:  none    Procedures/Significant Events:  10/18 PCXR:-Low lung volumes with crowding of interstitial lung markings. Mild central vascular congestio.- Nodular densities about the right hilum new and likely reflect pulmonary vessels seen on end.Pulmonary nodules are not entirely excluded     I have personally reviewed and interpreted all radiology studies and my findings are as above.  VENTILATOR SETTINGS: none   Cultures 10/18 blood pending 10/18 urine pending 10/19 strep pneumo urine antigen/Legionella urine antigen pending 10/19 Respiratory virus panel pending    Antimicrobials: Anti-infectives    Start     Stop   04/28/17 1100  vancomycin (VANCOCIN) 1,250 mg in sodium chloride 0.9 % 250 mL IVPB         04/28/17 0500  piperacillin-tazobactam (ZOSYN) IVPB 3.375 g         04/27/17 2230  vancomycin (VANCOCIN) 1,500 mg in sodium chloride 0.9 % 500 mL IVPB     04/28/17 0538   04/27/17 2015  vancomycin (VANCOCIN) IVPB 1000 mg/200 mL premix     04/27/17 2157   04/27/17 2015  piperacillin-tazobactam (ZOSYN) IVPB 3.375 g     04/27/17 2056       Devices none   LINES / TUBES:  none    Continuous Infusions: . sodium chloride 125 mL/hr at 04/27/17 2228  . piperacillin-tazobactam (ZOSYN)  IV 3.375 g (04/28/17 0630)  . potassium chloride    . vancomycin       Objective: Vitals:   04/27/17 2245 04/28/17 0415 04/28/17 0600 04/28/17 0700  BP: (!) 86/35 (!) 96/45 (!) 101/45   Pulse: 64 60 (!) 56   Resp: (!) 21 (!) 23 (!) 23   Temp:  (!) 97.1 F (36.2 C)  98.1 F (36.7 C)  TempSrc:  Oral  Oral  SpO2: 96% 94% 96%   Weight:      Height:         Intake/Output Summary (Last 24 hours) at 04/28/17 8338 Last data filed at 04/27/17 2157  Gross per 24 hour  Intake              250 ml  Output                0 ml  Net              250 ml   Filed Weights   04/27/17 1815  Weight: 276 lb (125.2 kg)    Examination:  General: A/O 3 (does not know why), positive acute respiratory distress Neck:  Negative scars, masses, torticollis, lymphadenopathy, JVD Lungs: Clear to auscultation bilaterally, except diminished breath sounds LLL,negative wheezing or crackles. Cardiovascular: Regular rate and rhythm without murmur gallop or rub normal S1 and S2 Abdomen: MORBIDLY OBESE, negative abdominal pain, nondistended, positive soft, bowel sounds, no rebound, no ascites, no appreciable mass Extremities: No significant cyanosis, clubbing, or edema bilateral lower extremities Skin: Negative rashes, lesions, ulcers Psychiatric:  Negative depression, negative anxiety, negative fatigue, negative mania  Central nervous system:  Cranial nerves II through XII intact, tongue/uvula midline, all extremities muscle strength 5/5, sensation intact throughout, negative dysarthria, negative expressive aphasia, negative receptive aphasia.  .     Data Reviewed: Care during the described time interval was provided by me .  I have reviewed this patient's available data, including medical history, events of note, physical examination, and all test results as part of my evaluation.   CBC:  Recent Labs Lab 04/27/17 1900 04/28/17 0258  WBC 13.5* 11.5*  NEUTROABS 11.4*  --   HGB 12.8* 11.7*  HCT 37.0* 34.5*  MCV 97.4 97.7  PLT PLATELET CLUMPS NOTED ON SMEAR, COUNT APPEARS ADEQUATE 42*   Basic Metabolic Panel:  Recent Labs Lab 04/27/17 1900 04/28/17 0258  NA 135 136  K 2.8* 2.8*  CL 104 103  CO2 24 24  GLUCOSE 199* 97  BUN 7 8  CREATININE 0.83 0.79  CALCIUM 8.9 8.2*  MG 1.4*  --    GFR: Estimated Creatinine Clearance: 123 mL/min (by C-G  formula based on SCr of 0.79 mg/dL). Liver Function Tests:  Recent Labs Lab 04/27/17 1900 04/28/17 0258  AST 75* 63*  ALT 52 44  ALKPHOS 165* 124  BILITOT 3.0* 2.8*  PROT 7.0 6.2*  ALBUMIN 2.4* 2.1*   No results for input(s): LIPASE, AMYLASE in the last 168 hours.  Recent Labs Lab 04/27/17 1902  AMMONIA 87*   Coagulation Profile:  Recent Labs Lab 04/27/17 1900  INR 1.64   Cardiac Enzymes: No results for input(s): CKTOTAL, CKMB, CKMBINDEX, TROPONINI in the last 168 hours. BNP (last 3 results) No results for input(s): PROBNP in the last 8760 hours. HbA1C: No results for input(s): HGBA1C in the last 72 hours. CBG:  Recent Labs Lab 04/28/17 0734  GLUCAP 95   Lipid Profile: No results for input(s): CHOL, HDL, LDLCALC, TRIG, CHOLHDL, LDLDIRECT  in the last 72 hours. Thyroid Function Tests: No results for input(s): TSH, T4TOTAL, FREET4, T3FREE, THYROIDAB in the last 72 hours. Anemia Panel: No results for input(s): VITAMINB12, FOLATE, FERRITIN, TIBC, IRON, RETICCTPCT in the last 72 hours. Urine analysis:    Component Value Date/Time   COLORURINE AMBER (A) 04/27/2017 2015   APPEARANCEUR CLEAR 04/27/2017 2015   LABSPEC 1.016 04/27/2017 2015   PHURINE 6.0 04/27/2017 2015   GLUCOSEU NEGATIVE 04/27/2017 2015   HGBUR SMALL (A) 04/27/2017 2015   BILIRUBINUR NEGATIVE 04/27/2017 2015   KETONESUR NEGATIVE 04/27/2017 2015   PROTEINUR NEGATIVE 04/27/2017 2015   UROBILINOGEN 0.2 05/14/2015 1813   NITRITE NEGATIVE 04/27/2017 2015   LEUKOCYTESUR NEGATIVE 04/27/2017 2015   Sepsis Labs: @LABRCNTIP (procalcitonin:4,lacticidven:4)  ) Recent Results (from the past 240 hour(s))  MRSA PCR Screening     Status: None   Collection Time: 04/27/17 11:36 PM  Result Value Ref Range Status   MRSA by PCR NEGATIVE NEGATIVE Final    Comment:        The GeneXpert MRSA Assay (FDA approved for NASAL specimens only), is one component of a comprehensive MRSA colonization surveillance  program. It is not intended to diagnose MRSA infection nor to guide or monitor treatment for MRSA infections.          Radiology Studies: Ct Head Wo Contrast  Result Date: 04/27/2017 CLINICAL DATA:  Fever and altered level of consciousness. EXAM: CT HEAD WITHOUT CONTRAST TECHNIQUE: Contiguous axial images were obtained from the base of the skull through the vertex without intravenous contrast. COMPARISON:  02/01/2017 FINDINGS: Brain: Stable chronic small vessel ischemia. Chronic focus of encephalomalacia involving the right occipital lobe. No large vascular territory infarct. No acute intracranial hemorrhage, mass or midline shift. No extra-axial fluid collections. Vascular: No hyperdense vessels. Moderate atherosclerosis of the cavernous sinus carotids and left vertebral arteries. Skull: Incomplete posterior arch of C1, developmental in appearance. Sinuses/Orbits: No acute finding. Mild right maxillary sinus atelectasis. Other: None IMPRESSION: 1. Chronic stable small vessel ischemic disease. 2. Chronic right occipital encephalomalacia. 3. No acute intracranial abnormality. Electronically Signed   By: Ashley Royalty M.D.   On: 04/27/2017 20:03   Dg Chest Port 1 View  Result Date: 04/27/2017 CLINICAL DATA:  Fever EXAM: PORTABLE CHEST 1 VIEW COMPARISON:  03/02/2017 dating back to 01/31/2017 FINDINGS: The cardiopericardial silhouette is mildly enlarged, some which is likely due to portable technique and low lung volumes. The aorta is not aneurysmal. Low lung volumes without pneumonic consolidation, effusion or pneumothorax. Rounded densities about the right hilum likely reflect vessels seen on end and central vascular congestion. Pulmonary nodules are not entirely excluded but believed less likely. No pneumonic consolidation. No effusion or pneumothorax. The visualized skeletal structures are unremarkable. IMPRESSION: 1. Low lung volumes with crowding of interstitial lung markings. Mild central  vascular congestion. 2. Nodular densities about the right hilum are new and likely reflect pulmonary vessels seen on end. As pulmonary nodules are not entirely excluded, follow-up radiographs with PA and lateral views or chest CT may help exclude pulmonary nodules. Electronically Signed   By: Ashley Royalty M.D.   On: 04/27/2017 20:00        Scheduled Meds: . atorvastatin  10 mg Oral Daily  . famotidine  20 mg Oral BID  . insulin aspart  0-15 Units Subcutaneous TID WC  . lactulose  10 g Oral QHS  . multivitamin with minerals  1 tablet Oral Daily  . PARoxetine  10 mg Oral Daily   Continuous  Infusions: . sodium chloride 125 mL/hr at 04/27/17 2228  . piperacillin-tazobactam (ZOSYN)  IV 3.375 g (04/28/17 0630)  . potassium chloride    . vancomycin       LOS: 1 day    Time spent: 40 minutes    WOODS, Geraldo Docker, MD [facility] Pager [phone]   If 7PM-7AM, please contact night-coverage [email or web address] Password [email or web address] 04/28/2017, 8:35 AM

## 2017-04-28 NOTE — Care Management Note (Signed)
Case Management Note  Patient Details  Name: Adam Butler MRN: [id number] Date of Birth: 04/25/1956  Subjective/Objective:    Pt admitted AMS secondary to sepsis.  Action/Plan:   PTA from home with wife.   Pt has PCP and denied barriers to obtaining medications.           Expected Discharge Date:  05/01/17               Expected Discharge Plan:  Home/Self Care  In-House Referral:     Discharge planning Services  CM Consult  Post Acute Care Choice:    Choice offered to:     DME Arranged:    DME Agency:     HH Arranged:    HH Agency:     Status of Service:  In process, will continue to follow  If discussed at Long Length of Stay Meetings, dates discussed:    Additional Comments:  Maryclare Labrador, RN 04/28/2017, 3:28 PM

## 2017-04-28 NOTE — Consult Note (Signed)
   [facility] CM Inpatient Consult   04/28/2017  Adam Butler 1956-04-23 [id number]  Chart reviewed for multiple admissions in the past 6 months.  Chart review reveals the patient is mildly confused with fever.  Patient's primary care provider is [facility] and this office is listed to provide the post hospital transition of care follow up.  Will alert inpatient RNCM of patient in the [employer]/[facility] if needs arises and following for progression and disposition needs.  Came by but RNCM not currently available while this writer was on the unit.   According to notes patient is from home and lives with wife and no noted issues for community care management currently.  Will follow.  For questions, please contact:  Natividad Brood, RN BSN [facility] Liaison  [phone] business mobile phone Toll free office [phone]

## 2017-04-28 NOTE — Progress Notes (Signed)
Nutrition Brief Note  Patient identified on the Malnutrition Screening Tool (MST) Report  Wt Readings from Last 15 Encounters:  04/28/17 277 lb 9 oz (125.9 kg)  04/06/17 276 lb (125.2 kg)  03/30/17 283 lb (128.4 kg)  03/22/17 268 lb (121.6 kg)  03/21/17 274 lb (124.3 kg)  03/07/17 283 lb 8 oz (128.6 kg)  03/03/17 286 lb 9.6 oz (130 kg)  02/07/17 285 lb (129.3 kg)  12/19/16 286 lb (129.7 kg)  11/17/16 286 lb (129.7 kg)  11/03/16 287 lb (130.2 kg)  08/04/16 286 lb 3.2 oz (129.8 kg)  07/19/16 278 lb (126.1 kg)  07/12/16 283 lb (128.4 kg)  05/30/16 283 lb (128.4 kg)   Adam Butler is a 61 y.o. male with medical history significant of Cirrhosis likely from NASH, esophageal varices, rectal varices, prior admissions for sepsis with positive BCx in 02/2016 and 02/2017.  Patient presents to the ED with AMS, fever of 102 at home.  Wife brings him in to the ED.  Pt admitted with sepsis.   Pt out of room x x at times of attempted visits. Per MST report, pt reports good appetite PTA. Reviewed wt hx, weight fluctuantes at baseline but generally falls between 270-285#. No weight changes significant for time frame.   Last Hgb A1c: 9.5 (02/24/17). Home DM medications are 10 mg glipizide BID, 25-80 units regular insulin TID with meals.   Labs reviewed: K: 2.8 (on IV supplementation). CBGS: 95 (inpatient orders for glycemic control 0-15 units TID with meals).   Body mass index is 43.47 kg/m. Patient meets criteria for extreme obesity, class III based on current BMI.   Current diet order is Carb Modified, patient is consuming approximately 100% of meals at this time. Labs and medications reviewed.   No nutrition interventions warranted at this time. If nutrition issues arise, please consult RD.   Lakechia Nay A. Jimmye Norman, RD, LDN, CDE Pager: [phone] After hours Pager: [phone]

## 2017-04-29 DIAGNOSIS — K7030 Alcoholic cirrhosis of liver without ascites: Secondary | ICD-10-CM

## 2017-04-29 LAB — URINE CULTURE: CULTURE: NO GROWTH

## 2017-04-29 LAB — GLUCOSE, CAPILLARY
GLUCOSE-CAPILLARY: 82 mg/dL (ref 65–99)
GLUCOSE-CAPILLARY: 199 mg/dL — AB (ref 65–99)
GLUCOSE-CAPILLARY: 193 mg/dL — AB (ref 65–99)
Glucose-Capillary: 158 mg/dL — ABNORMAL HIGH (ref 65–99)

## 2017-04-29 LAB — BASIC METABOLIC PANEL
Anion gap: 7 (ref 5–15)
BUN: 9 mg/dL (ref 6–20)
CO2: 23 mmol/L (ref 22–32)
CREATININE: 0.78 mg/dL (ref 0.61–1.24)
Calcium: 7.9 mg/dL — ABNORMAL LOW (ref 8.9–10.3)
Chloride: 105 mmol/L (ref 101–111)
GFR calc non Af Amer: >60 mL/min (ref >60)
Glucose, Bld: 91 mg/dL (ref 65–99)
Potassium: 3.3 mmol/L — ABNORMAL LOW (ref 3.5–5.1)
SODIUM: 135 mmol/L (ref 135–145)

## 2017-04-29 LAB — CBC
HCT: 35.6 % — ABNORMAL LOW (ref 39.0–52.0)
Hemoglobin: 11.8 g/dL — ABNORMAL LOW (ref 13.0–17.0)
MCH: 33 pg (ref 26.0–34.0)
MCHC: 33.1 g/dL (ref 30.0–36.0)
MCV: 99.4 fL (ref 78.0–100.0)
PLATELETS: 39 10*3/uL — AB (ref 150–400)
RBC: 3.58 MIL/uL — ABNORMAL LOW (ref 4.22–5.81)
RDW: 14.7 % (ref 11.5–15.5)
WBC: 5.8 10*3/uL (ref 4.0–10.5)

## 2017-04-29 LAB — AMMONIA: Ammonia: 43 umol/L — ABNORMAL HIGH (ref 9–35)

## 2017-04-29 LAB — LEGIONELLA PNEUMOPHILA SEROGP 1 UR AG: L. pneumophila Serogp 1 Ur Ag: NEGATIVE

## 2017-04-29 LAB — MAGNESIUM: MAGNESIUM: 1.7 mg/dL (ref 1.7–2.4)

## 2017-04-29 MED ORDER — POTASSIUM CHLORIDE CRYS ER 20 MEQ PO TBCR
60.0000 meq | EXTENDED_RELEASE_TABLET | Freq: Once | ORAL | Status: AC
  Administered 2017-04-29: 60 meq via ORAL
  Filled 2017-04-29: qty 3

## 2017-04-29 MED ORDER — MAGNESIUM SULFATE 2 GM/50ML IV SOLN
2.0000 g | Freq: Once | INTRAVENOUS | Status: AC
  Administered 2017-04-29: 2 g via INTRAVENOUS
  Filled 2017-04-29: qty 50

## 2017-04-29 NOTE — Progress Notes (Signed)
PROGRESS NOTE    Adam Butler  NHA:[id number] DOB: 12/11/1955 DOA: 04/27/2017 PCP: [name], NP   Brief Narrative:  61 y.o. WM PMHx Liver Cirrhosis likely from NASH, Esophageal Varices, Rectal varices, Chronic Diastolic CHF, CAD S/P stent placement, MI, SVT, Diabetes type 2 uncontrolled with complication. Prior admissions for sepsis with positive BCx in 02/2016 and 02/2017.   Presents to the ED with AMS, fever of 102 at home.  Wife brings him in to the ED.     ED Course: Tm 832.9  SBP ~191 systolic, given 1L NS bolus.  WBC 13.5k.  Mental status slightly improved, no localizing findings or symptoms.  Hospitalist asked to admit.      Subjective: 10/20 afebrile last 24 hours. A/O 4,negative abdominal pain, negative CP, negative SOB.     Assessment & Plan:   Principal Problem:   Sepsis ([place]) Active Problems:   DIASTOLIC HEART FAILURE, CHRONIC   Esophageal varices without bleeding ([facility])   Liver cirrhosis secondary to NASH ([place])   Type 2 diabetes mellitus ([place])   Sepsis unspecified organism -all cultures NGTD, patient afebrile, negative leukocytosis. DC antibiotics and monitor. -normal saline 75 ml/hr - MAP goal> 65  Chronic Diastolic CHF -Unknown base weight. Patient states weighs himself daily but unsure of his weight (still slightly confused). -strict in and out since admission +2.1 L               -Daily weight Filed Weights   04/27/17 1815 04/28/17 1019  Weight: 276 lb (125.2 kg) 277 lb 9 oz (125.9 kg)  -hold all diuretics/BP medication secondary to hypotension  Lung nodules? -CT chest: Negative for pulmonary nodule see results below  Ascending thoracic aortic aneurysm   Diabetes type 2 uncontrolled with complication -6/60AYOKHTXHFS A1c= 9.5 -moderate SSI  Liver Cirrhosis without ascites/[place] -monitor ammonia level   Recent Labs Lab 04/27/17 1902 04/28/17 0859 04/29/17 0317  AMMONIA 87* 67* 43*  -lactulose 20 g daily (patient's home dose  10 g daily)   Altered mental status -Multifactorial sepsis, elevated ammonia,  Hypokalemia -Potassium goal> 4 -Potassium IV 60 mEq  Hypomagnesemia -magnesium goal> 2 -Magnesium IV 2 g  Thrombocytopenia(Baseline ~50) -No overt signs of bleeding however platelets currently 42. Hold all anticoagulation products.  -Monitor closely. Most likely secondary to sepsis.      DVT prophylaxis: sCD Code Status: full Family Communication: none Disposition Plan: TBD   Consultants:  none    Procedures/Significant Events:  10/18 PCXR:-Low lung volumes with crowding of interstitial lung markings. Mild central vascular congestio.- Nodular densities about the right hilum new and likely reflect pulmonary vessels seen on end.Pulmonary nodules are not entirely excluded 10/19 CT head WO contrast: Chronic stable small vessel ischemic disease. 2. Chronic right occipital encephalomalacia. 3. No acute intracranial abnormality. 10/19 CT chest WO contrast: 4.1 cm ascending thoracic aortic aneurysm. -No definite pulmonary nodules -Small sliding-type hiatal hernia-Hepatic cirrhosis is noted with mild splenomegaly suggesting portal hypertension.  -Paraesophageal varices. -Aortic Atherosclerosis (ICD10-I70.0) and Emphysema (ICD10-J43.9).          I have personally reviewed and interpreted all radiology studies and my findings are as above.  VENTILATOR SETTINGS: none   Cultures 10/18 blood NGTD 10/18 urine negative 10/18MRSA by PCR negative 10/19 strep pneumo urine antigen/Legionella urine antigen negative 10/19 Respiratory virus panel negative    Antimicrobials: Anti-infectives    Start     Stop   04/28/17 1100  vancomycin (VANCOCIN) 1,250 mg in sodium chloride 0.9 % 250 mL IVPB  04/28/17 0500  piperacillin-tazobactam (ZOSYN) IVPB 3.375 g         04/27/17 2230  vancomycin (VANCOCIN) 1,500 mg in sodium chloride 0.9 % 500 mL IVPB     04/28/17 0538   04/27/17 2015   vancomycin (VANCOCIN) IVPB 1000 mg/200 mL premix     04/27/17 2157   04/27/17 2015  piperacillin-tazobactam (ZOSYN) IVPB 3.375 g     04/27/17 2056       Devices none   LINES / TUBES:  none    Continuous Infusions: . sodium chloride 150 mL/hr at 04/29/17 0441  . piperacillin-tazobactam (ZOSYN)  IV 3.375 g (04/29/17 0555)  . vancomycin Stopped (04/29/17 0122)     Objective: Vitals:   04/29/17 0000 04/29/17 0312 04/29/17 0400 04/29/17 0826  BP: (!) 107/55 127/64 (!) 92/58 (!) 107/48  Pulse: (!) 51 (!) 57 (!) 53   Resp: 16 18 19    Temp:  98.4 F (36.9 C)  98.1 F (36.7 C)  TempSrc:  Oral  Oral  SpO2: 96% 98% 97%   Weight:      Height:        Intake/Output Summary (Last 24 hours) at 04/29/17 0844 Last data filed at 04/29/17 0826  Gross per 24 hour  Intake           2412.5 ml  Output              600 ml  Net           1812.5 ml   Filed Weights   04/27/17 1815 04/28/17 1019  Weight: 276 lb (125.2 kg) 277 lb 9 oz (125.9 kg)    Physical Exam:  General:A/O 4,, negative acute respiratory distress Neck:  Negative scars, masses, torticollis, lymphadenopathy, JVD Lungs: Clear to auscultation bilaterally, negative wheezing or crackles. Cardiovascular: Regular rate and rhythm without murmur gallop or rub normal S1 and S2 Abdomen: MORBIDLY OBESE, negative abdominal pain, nondistended, positive soft, bowel sounds, no rebound, no ascites, no appreciable mass Extremities: No significant cyanosis, clubbing, or edema bilateral lower extremities Skin: Negative rashes, lesions, ulcers Psychiatric:  Negative depression, negative anxiety, negative fatigue, negative mania  Central nervous system:  Cranial nerves II through XII intact, tongue/uvula midline, all extremities muscle strength 5/5, sensation intact throughout, negative dysarthria, negative expressive aphasia, negative receptive aphasia. .     Data Reviewed: Care during the described time interval was provided by me .   I have reviewed this patient's available data, including medical history, events of note, physical examination, and all test results as part of my evaluation.   CBC:  Recent Labs Lab 04/27/17 1900 04/28/17 0258  WBC 13.5* 11.5*  NEUTROABS 11.4*  --   HGB 12.8* 11.7*  HCT 37.0* 34.5*  MCV 97.4 97.7  PLT PLATELET CLUMPS NOTED ON SMEAR, COUNT APPEARS ADEQUATE 42*   Basic Metabolic Panel:  Recent Labs Lab 04/27/17 1900 04/28/17 0258 04/28/17 0859  NA 135 136  --   K 2.8* 2.8* 3.1*  CL 104 103  --   CO2 24 24  --   GLUCOSE 199* 97  --   BUN 7 8  --   CREATININE 0.83 0.79  --   CALCIUM 8.9 8.2*  --   MG 1.4*  --  1.6*   GFR: Estimated Creatinine Clearance: 123.4 mL/min (by C-G formula based on SCr of 0.79 mg/dL). Liver Function Tests:  Recent Labs Lab 04/27/17 1900 04/28/17 0258  AST 75* 63*  ALT 52 44  ALKPHOS 165* 124  BILITOT 3.0* 2.8*  PROT 7.0 6.2*  ALBUMIN 2.4* 2.1*   No results for input(s): LIPASE, AMYLASE in the last 168 hours.  Recent Labs Lab 04/27/17 1902 04/28/17 0859 04/29/17 0317  AMMONIA 87* 67* 43*   Coagulation Profile:  Recent Labs Lab 04/27/17 1900  INR 1.64   Cardiac Enzymes: No results for input(s): CKTOTAL, CKMB, CKMBINDEX, TROPONINI in the last 168 hours. BNP (last 3 results) No results for input(s): PROBNP in the last 8760 hours. HbA1C: No results for input(s): HGBA1C in the last 72 hours. CBG:  Recent Labs Lab 04/28/17 0734 04/28/17 1223 04/28/17 1504 04/28/17 2159 04/29/17 0748  GLUCAP 95 165* 248* 188* 82   Lipid Profile: No results for input(s): CHOL, HDL, LDLCALC, TRIG, CHOLHDL, LDLDIRECT in the last 72 hours. Thyroid Function Tests: No results for input(s): TSH, T4TOTAL, FREET4, T3FREE, THYROIDAB in the last 72 hours. Anemia Panel: No results for input(s): VITAMINB12, FOLATE, FERRITIN, TIBC, IRON, RETICCTPCT in the last 72 hours. Urine analysis:    Component Value Date/Time   COLORURINE AMBER (A)  04/27/2017 2015   APPEARANCEUR CLEAR 04/27/2017 2015   LABSPEC 1.016 04/27/2017 2015   PHURINE 6.0 04/27/2017 2015   GLUCOSEU NEGATIVE 04/27/2017 2015   HGBUR SMALL (A) 04/27/2017 2015   BILIRUBINUR NEGATIVE 04/27/2017 2015   KETONESUR NEGATIVE 04/27/2017 2015   PROTEINUR NEGATIVE 04/27/2017 2015   UROBILINOGEN 0.2 05/14/2015 1813   NITRITE NEGATIVE 04/27/2017 2015   LEUKOCYTESUR NEGATIVE 04/27/2017 2015   Sepsis Labs: @LABRCNTIP (procalcitonin:4,lacticidven:4)  ) Recent Results (from the past 240 hour(s))  Blood Culture (routine x 2)     Status: None (Preliminary result)   Collection Time: 04/27/17  6:40 PM  Result Value Ref Range Status   Specimen Description BLOOD LEFT HAND  Final   Special Requests   Final    BOTTLES DRAWN AEROBIC AND ANAEROBIC Blood Culture adequate volume   Culture NO GROWTH < 24 HOURS  Final   Report Status PENDING  Incomplete  Blood Culture (routine x 2)     Status: None (Preliminary result)   Collection Time: 04/27/17  6:55 PM  Result Value Ref Range Status   Specimen Description BLOOD BLOOD RIGHT FOREARM  Final   Special Requests IN PEDIATRIC BOTTLE Blood Culture adequate volume  Final   Culture NO GROWTH < 24 HOURS  Final   Report Status PENDING  Incomplete  MRSA PCR Screening     Status: None   Collection Time: 04/27/17 11:36 PM  Result Value Ref Range Status   MRSA by PCR NEGATIVE NEGATIVE Final    Comment:        The GeneXpert MRSA Assay (FDA approved for NASAL specimens only), is one component of a comprehensive MRSA colonization surveillance program. It is not intended to diagnose MRSA infection nor to guide or monitor treatment for MRSA infections.   Respiratory Panel by PCR     Status: None   Collection Time: 04/28/17  9:58 AM  Result Value Ref Range Status   Adenovirus NOT DETECTED NOT DETECTED Final   Coronavirus 229E NOT DETECTED NOT DETECTED Final   Coronavirus HKU1 NOT DETECTED NOT DETECTED Final   Coronavirus NL63 NOT  DETECTED NOT DETECTED Final   Coronavirus OC43 NOT DETECTED NOT DETECTED Final   Metapneumovirus NOT DETECTED NOT DETECTED Final   Rhinovirus / Enterovirus NOT DETECTED NOT DETECTED Final   Influenza A NOT DETECTED NOT DETECTED Final   Influenza B NOT DETECTED NOT DETECTED Final   Parainfluenza Virus 1 NOT DETECTED  NOT DETECTED Final   Parainfluenza Virus 2 NOT DETECTED NOT DETECTED Final   Parainfluenza Virus 3 NOT DETECTED NOT DETECTED Final   Parainfluenza Virus 4 NOT DETECTED NOT DETECTED Final   Respiratory Syncytial Virus NOT DETECTED NOT DETECTED Final   Bordetella pertussis NOT DETECTED NOT DETECTED Final   Chlamydophila pneumoniae NOT DETECTED NOT DETECTED Final   Mycoplasma pneumoniae NOT DETECTED NOT DETECTED Final         Radiology Studies: Ct Head Wo Contrast  Result Date: 04/27/2017 CLINICAL DATA:  Fever and altered level of consciousness. EXAM: CT HEAD WITHOUT CONTRAST TECHNIQUE: Contiguous axial images were obtained from the base of the skull through the vertex without intravenous contrast. COMPARISON:  02/01/2017 FINDINGS: Brain: Stable chronic small vessel ischemia. Chronic focus of encephalomalacia involving the right occipital lobe. No large vascular territory infarct. No acute intracranial hemorrhage, mass or midline shift. No extra-axial fluid collections. Vascular: No hyperdense vessels. Moderate atherosclerosis of the cavernous sinus carotids and left vertebral arteries. Skull: Incomplete posterior arch of C1, developmental in appearance. Sinuses/Orbits: No acute finding. Mild right maxillary sinus atelectasis. Other: None IMPRESSION: 1. Chronic stable small vessel ischemic disease. 2. Chronic right occipital encephalomalacia. 3. No acute intracranial abnormality. Electronically Signed   By: Ashley Royalty M.D.   On: 04/27/2017 20:03   Ct Chest Wo Contrast  Result Date: 04/28/2017 CLINICAL DATA:  Possible pulmonary nodules. EXAM: CT CHEST WITHOUT CONTRAST TECHNIQUE:  Multidetector CT imaging of the chest was performed following the standard protocol without IV contrast. COMPARISON:  Radiograph of April 27, 2017. FINDINGS: Cardiovascular: 4.1 cm ascending thoracic aortic aneurysm is noted. Atherosclerosis of thoracic aorta is noted. Coronary artery calcifications are noted. Normal cardiac size. No pericardial effusion is noted. Mediastinum/Nodes: Small sliding-type hiatal hernia is noted. Thyroid gland is unremarkable. No significant mediastinal adenopathy is noted. Lungs/Pleura: No pneumothorax or pleural effusion is noted. Emphysematous disease is noted in both upper lobes, predominantly on the right. No pulmonary nodules are noted. No acute pulmonary disease is noted. Upper Abdomen: Hepatic cirrhosis is noted. Mild splenomegaly is noted. Collateral veins are noted as well as paraesophageal varices. Cholelithiasis is noted. Musculoskeletal: No chest wall mass or suspicious bone lesions identified. IMPRESSION: 4.1 cm ascending thoracic aortic aneurysm. Recommend annual imaging followup by CTA or MRA. This recommendation follows 2010 ACCF/AHA/AATS/ACR/ASA/SCA/SCAI/SIR/STS/SVM Guidelines for the Diagnosis and Management of Patients with Thoracic Aortic Disease. Circulation. 2010; 121: K270-W237. No definite pulmonary nodules are noted. Coronary artery calcifications are noted suggesting coronary artery disease. Small sliding-type hiatal hernia is noted. Hepatic cirrhosis is noted with mild splenomegaly suggesting portal hypertension. Paraesophageal varices are noted as well. Aortic Atherosclerosis (ICD10-I70.0) and Emphysema (ICD10-J43.9). Electronically Signed   By: Marijo Conception, M.D.   On: 04/28/2017 11:46   Dg Chest Port 1 View  Result Date: 04/27/2017 CLINICAL DATA:  Fever EXAM: PORTABLE CHEST 1 VIEW COMPARISON:  03/02/2017 dating back to 01/31/2017 FINDINGS: The cardiopericardial silhouette is mildly enlarged, some which is likely due to portable technique and low  lung volumes. The aorta is not aneurysmal. Low lung volumes without pneumonic consolidation, effusion or pneumothorax. Rounded densities about the right hilum likely reflect vessels seen on end and central vascular congestion. Pulmonary nodules are not entirely excluded but believed less likely. No pneumonic consolidation. No effusion or pneumothorax. The visualized skeletal structures are unremarkable. IMPRESSION: 1. Low lung volumes with crowding of interstitial lung markings. Mild central vascular congestion. 2. Nodular densities about the right hilum are new and likely reflect pulmonary vessels  seen on end. As pulmonary nodules are not entirely excluded, follow-up radiographs with PA and lateral views or chest CT may help exclude pulmonary nodules. Electronically Signed   By: Ashley Royalty M.D.   On: 04/27/2017 20:00        Scheduled Meds: . atorvastatin  10 mg Oral Daily  . famotidine  20 mg Oral BID  . insulin aspart  0-15 Units Subcutaneous TID WC  . lactulose  20 g Oral QHS  . multivitamin with minerals  1 tablet Oral Daily  . PARoxetine  10 mg Oral Daily   Continuous Infusions: . sodium chloride 150 mL/hr at 04/29/17 0441  . piperacillin-tazobactam (ZOSYN)  IV 3.375 g (04/29/17 0555)  . vancomycin Stopped (04/29/17 0122)     LOS: 2 days    Time spent: 40 minutes    Delanda Bulluck, Geraldo Docker, MD [facility] Pager [phone]   If 7PM-7AM, please contact night-coverage [email or web address] Password [email or web address] 04/29/2017, 8:44 AM

## 2017-04-30 DIAGNOSIS — E118 Type 2 diabetes mellitus with unspecified complications: Secondary | ICD-10-CM

## 2017-04-30 DIAGNOSIS — K7030 Alcoholic cirrhosis of liver without ascites: Secondary | ICD-10-CM

## 2017-04-30 DIAGNOSIS — I7121 Aneurysm of the ascending aorta, without rupture: Secondary | ICD-10-CM

## 2017-04-30 DIAGNOSIS — D696 Thrombocytopenia, unspecified: Secondary | ICD-10-CM

## 2017-04-30 DIAGNOSIS — I5032 Chronic diastolic (congestive) heart failure: Secondary | ICD-10-CM

## 2017-04-30 DIAGNOSIS — E1165 Type 2 diabetes mellitus with hyperglycemia: Secondary | ICD-10-CM

## 2017-04-30 DIAGNOSIS — K7581 Nonalcoholic steatohepatitis (NASH): Secondary | ICD-10-CM

## 2017-04-30 DIAGNOSIS — I712 Thoracic aortic aneurysm, without rupture: Secondary | ICD-10-CM

## 2017-04-30 DIAGNOSIS — IMO0002 Reserved for concepts with insufficient information to code with codable children: Secondary | ICD-10-CM

## 2017-04-30 DIAGNOSIS — R400 Somnolence: Secondary | ICD-10-CM

## 2017-04-30 LAB — GLUCOSE, CAPILLARY: Glucose-Capillary: 117 mg/dL — ABNORMAL HIGH (ref 65–99)

## 2017-04-30 MED ORDER — LACTULOSE 10 GM/15ML PO SOLN: 20.0000 g | Freq: Every day | ORAL | 0 refills | Status: DC

## 2017-04-30 MED ORDER — INSULIN GLARGINE 100 UNIT/ML SOLOSTAR PEN: 8.0000 [IU] | PEN_INJECTOR | Freq: Every day | SUBCUTANEOUS | 0 refills | Status: DC

## 2017-04-30 MED ORDER — PEN NEEDLES 31G X 8 MM MISC: 8.0000 [IU] | Freq: Every day | 0 refills | Status: DC

## 2017-04-30 MED ORDER — INSULIN GLARGINE 100 UNIT/ML ~~LOC~~ SOLN
8.0000 [IU] | Freq: Every day | SUBCUTANEOUS | Status: DC
  Filled 2017-04-30: qty 0.08

## 2017-04-30 NOTE — Discharge Summary (Addendum)
Physician Discharge Summary  Adam Butler [id number] DOB: 09/22/1955 DOA: 04/27/2017  PCP: Adam Fenton, NP  Admit date: 04/27/2017 Discharge date: 04/30/2017  Time spent: 35 minutes  Recommendations for Outpatient Follow-up:  Sepsis unspecified organism -all cultures NGTD, patient afebrile, negative leukocytosis.  -patient has been asymptomatic off antibiotics.   Chronic Diastolic CHF -Unknown base weight. Patient states weighs himself daily but unsure of his weight  -strict in and out since admission +2.5 L              -Daily weight     Filed Weights    04/27/17 1815 04/28/17 1019  Weight: 276 lb (125.2 kg) 277 lb 9 oz (125.9 kg)  -hold all diuretics/BP medication secondary to hypotension -NOTE patient's SBP goal< 140 secondary to thoracic aortic aneurysm. Currently at goal -schedule follow-up with Dr. Loralie Butler in 1-2 weeks chronic diastolic CHF. Will decide when/if to restart BP medication.   Lung nodules? -CT chest: Negative for pulmonary nodule see results below   Ascending Thoracic Aortic Aneurysm -counseled patient on new finding of 4.1 cm aneurysm. -Counseled patient his PCP NP Adam Butler would need to monitor closely. Schedule follow-up in 1-2 weeks sepsis unspecified organism, chronic diastolic CHF,ascending thoracic aortic aneurysm, uncontrolled diabetes. -PCP to schedule establish care appointment in 1-2 weeks with Dr. Dortha Butler. Adam Butler cardiothoracic surgery for incidental finding of ascending thoracic aortic aneurysm. Will need repeat CT scan in one year  Diabetes type 2 uncontrolled with complication -4/[id number] A1c= 9.5 -Lantus 8 units daily -Restart home sliding scale Humulin R -restart home glipizide  -have added Lantus to patient's home regimen,PCP will need to adjust medication   Liver Cirrhosis without ascites/[place] -monitor ammonia level     Recent Labs Lab 04/27/17 1902 04/28/17 0859 04/29/17 0317  AMMONIA 87* 67* 43*   -Lactulose 20 g daily     Altered mental status -Multifactorial sepsis, elevated ammonia,   Hypokalemia -Potassium goal> 4   Hypomagnesemia -magnesium goal> 2   Thrombocytopenia(Baseline ~50) -No overt signs of bleeding. Hold all anticoagulation products.  -PCP to Monitor closely.       Discharge Diagnoses:  Principal Problem:   Sepsis ([place]) Active Problems:   DIASTOLIC HEART FAILURE, CHRONIC   Esophageal varices without bleeding (H[facility])   Liver cirrhosis secondary to NASH [facility])   Type 2 diabetes mellitus ([place])   Discharge Condition:stable  Diet recommendation: heart healthy/American diabetic Association  Filed Weights   04/27/17 1815 04/28/17 1019 04/30/17 0500  Weight: 276 lb (125.2 kg) 277 lb 9 oz (125.9 kg) 274 lb 14.6 oz (124.7 kg)    History of present illness:  61 y.o. WM PMHx Liver Cirrhosis likely from NASH, Esophageal Varices, Rectal varices, Chronic Diastolic CHF, CAD S/P stent placement, MI, SVT, Diabetes type 2 uncontrolled with complication. Prior admissions for sepsis with positive BCx in 02/2016 and 02/2017.    Presents to the ED with AMS, fever of 102 at home.  Wife brings him in to the ED.     ED Course: Tm 263.7  SBP ~858 systolic, given 1L NS bolus.  WBC 13.5k.  Mental status slightly improved, no localizing findings or symptoms.  Hospitalist asked to admit.  During his hospitalization patient was treated for sepsis unspecified organism, chronic diastolic CHF, diabetes type 2 uncontrolled with complications, and altered mental status. Patient responded well to antibiotics, increased lactulose for elevated ammonia level, and adjustment of diabetic medication. During patient's workup he received a CT of chest which revealed a  4.1 cm ascending thoracic aortic aneurysm which will need to be monitored closely. Patient is aware of new finding.  .  Procedures: 10/18 PCXR:-Low lung volumes with crowding of interstitial lung markings. Mild central vascular  congestio.- Nodular densities about the right hilum new and likely reflect pulmonary vessels seen on end.Pulmonary nodules are not entirely excluded 10/19 CT head WO contrast: Chronic stable small vessel ischemic disease. 2. Chronic right occipital encephalomalacia. 3. No acute intracranial abnormality. 10/19 CT chest WO contrast: 4.1 cm ascending thoracic aortic aneurysm. -No definite pulmonary nodules -Small sliding-type hiatal hernia-Hepatic cirrhosis is noted with mild splenomegaly suggesting portal hypertension.  -Paraesophageal varices. -Aortic Atherosclerosis (ICD10-I70.0) and Emphysema (ICD10-J43.9).  Consultations: None  Cultures  10/18 blood NGTD 10/18 urine negative 10/18MRSA by PCR negative 10/19 strep pneumo urine antigen/Legionella urine antigen negative 10/19 Respiratory virus panel negative   Antibiotics Anti-infectives    Start     Stop   04/28/17 1100  vancomycin (VANCOCIN) 1,250 mg in sodium chloride 0.9 % 250 mL IVPB  Status:  Discontinued     04/29/17 1414   04/28/17 0500  piperacillin-tazobactam (ZOSYN) IVPB 3.375 g  Status:  Discontinued     04/29/17 1414   04/27/17 2230  vancomycin (VANCOCIN) 1,500 mg in sodium chloride 0.9 % 500 mL IVPB     04/28/17 0538   04/27/17 2015  vancomycin (VANCOCIN) IVPB 1000 mg/200 mL premix     04/27/17 2157   04/27/17 2015  piperacillin-tazobactam (ZOSYN) IVPB 3.375 g     04/27/17 2056       Discharge Exam: Vitals:   04/29/17 2310 04/30/17 0417 04/30/17 0500 04/30/17 0738  BP: (!) 90/33 (!) 97/48  (!) 95/55  Pulse: (!) 57 65    Resp: (!) 21 (!) 23    Temp: 98.1 F (36.7 C) 98.4 F (36.9 C)  (!) 97.4 F (36.3 C)  TempSrc: Oral Oral  Oral  SpO2: 99% 98%  98%  Weight:   274 lb 14.6 oz (124.7 kg)   Height:        General:A/O 4,, negative acute respiratory distress Neck:  Negative scars, masses, torticollis, lymphadenopathy, JVD Lungs: Clear to auscultation bilaterally, negative wheezing or  crackles. Cardiovascular: Regular rate and rhythm without murmur gallop or rub normal S1 and S2  Discharge Instructions   Allergies as of 04/30/2017      Reactions   Isosorbide Nitrate Other (See Comments)   Nose bleeds      Medication List    STOP taking these medications   furosemide 40 MG tablet Commonly known as:  LASIX   nadolol 40 MG tablet Commonly known as:  CORGARD   spironolactone 25 MG tablet Commonly known as:  ALDACTONE     TAKE these medications   atorvastatin 10 MG tablet Commonly known as:  LIPITOR TAKE 1 TABLET BY MOUTH ONCE DAILY What changed:  See the new instructions.   BAYER WOMENS 81-777 MG Tabs Generic drug:  Aspirin-Calcium Carbonate Take 81 mg by mouth daily.   cyanocobalamin 1000 MCG tablet Take 1,000 mcg by mouth daily. Vitamin B12   fluticasone 50 MCG/ACT nasal spray Commonly known as:  FLONASE Place 2 sprays into both nostrils 3 (three) times daily as needed for allergies or rhinitis (congestion).   glipiZIDE 10 MG tablet Commonly known as:  GLUCOTROL TAKE 1 TABLET BY MOUTH TWICE DAILY BEFORE  A  MEAL What changed:  See the new instructions.   Insulin Glargine 100 UNIT/ML Solostar Pen Commonly known as:  LANTUS  Inject 8 Units into the skin daily at 10 pm.   insulin regular 100 units/mL injection Commonly known as:  NOVOLIN R,HUMULIN R Inject 25-80 Units into the skin 3 (three) times daily as needed for high blood sugar (CBG >150).   lactulose 10 GM/15ML solution Commonly known as:  CHRONULAC Take 30 mLs (20 g total) by mouth at bedtime. What changed:  how much to take   multivitamin with minerals Tabs tablet Take 1 tablet by mouth daily.   nitroGLYCERIN 0.4 MG SL tablet Commonly known as:  NITROSTAT Place 1 tablet (0.4 mg total) under the tongue every 5 (five) minutes as needed for chest pain (chest pain).   PARoxetine 10 MG tablet Commonly known as:  PAXIL Take 1 tablet (10 mg total) by mouth daily.   Pen Needles 31G  X 8 MM Misc 8 Units by Does not apply route at bedtime.   ranitidine 150 MG tablet Commonly known as:  ZANTAC Take 150 mg by mouth See admin instructions. Take 1 tablet (150 mg) by mouth every morning, may also take 1 tablet at night as needed for acid reflux      Allergies  Allergen Reactions  . Isosorbide Nitrate Other (See Comments)    Nose bleeds   Follow-up Information    Adam Fenton, NP. Schedule an appointment as soon as possible for a visit in 2 week(s).   Specialty:  Internal Medicine Why:  Counseled patient his PCP NP Adam Butler would need to monitor closely. Schedule follow-up in 1-2 weeks sepsis unspecified organism, chronic diastolic CHF,ascending thoracic aortic aneurysm, uncontrolled diabetes  Contact information: [place] [address] [phone]        Adam Butler, Collier Salina, MD. Schedule an appointment as soon as possible for a visit in 2 week(s).   Specialty:  Cardiothoracic Surgery Why:  schedule establish care appointmentin 1-2 weeks with Dr. Collier Salina A. Adam Butler cardiothoracic surgery for incidental finding of ascending thoracic aortic aneurysm  Contact information: [address] [place] [address] [phone]            The results of significant diagnostics from this hospitalization (including imaging, microbiology, ancillary and laboratory) are listed below for reference.    Significant Diagnostic Studies: Ct Head Wo Contrast  Result Date: 04/27/2017 CLINICAL DATA:  Fever and altered level of consciousness. EXAM: CT HEAD WITHOUT CONTRAST TECHNIQUE: Contiguous axial images were obtained from the base of the skull through the vertex without intravenous contrast. COMPARISON:  02/01/2017 FINDINGS: Brain: Stable chronic small vessel ischemia. Chronic focus of encephalomalacia involving the right occipital lobe. No large vascular territory infarct. No acute intracranial hemorrhage, mass or midline shift. No  extra-axial fluid collections. Vascular: No hyperdense vessels. Moderate atherosclerosis of the cavernous sinus carotids and left vertebral arteries. Skull: Incomplete posterior arch of C1, developmental in appearance. Sinuses/Orbits: No acute finding. Mild right maxillary sinus atelectasis. Other: None IMPRESSION: 1. Chronic stable small vessel ischemic disease. 2. Chronic right occipital encephalomalacia. 3. No acute intracranial abnormality. Electronically Signed   By: Ashley Royalty M.D.   On: 04/27/2017 20:03   Ct Chest Wo Contrast  Result Date: 04/28/2017 CLINICAL DATA:  Possible pulmonary nodules. EXAM: CT CHEST WITHOUT CONTRAST TECHNIQUE: Multidetector CT imaging of the chest was performed following the standard protocol without IV contrast. COMPARISON:  Radiograph of April 27, 2017. FINDINGS: Cardiovascular: 4.1 cm ascending thoracic aortic aneurysm is noted. Atherosclerosis of thoracic aorta is noted. Coronary artery calcifications are noted.  Normal cardiac size. No pericardial effusion is noted. Mediastinum/Nodes: Small sliding-type hiatal hernia is noted. Thyroid gland is unremarkable. No significant mediastinal adenopathy is noted. Lungs/Pleura: No pneumothorax or pleural effusion is noted. Emphysematous disease is noted in both upper lobes, predominantly on the right. No pulmonary nodules are noted. No acute pulmonary disease is noted. Upper Abdomen: Hepatic cirrhosis is noted. Mild splenomegaly is noted. Collateral veins are noted as well as paraesophageal varices. Cholelithiasis is noted. Musculoskeletal: No chest wall mass or suspicious bone lesions identified. IMPRESSION: 4.1 cm ascending thoracic aortic aneurysm. Recommend annual imaging followup by CTA or MRA. This recommendation follows 2010 ACCF/AHA/AATS/ACR/ASA/SCA/SCAI/SIR/STS/SVM Guidelines for the Diagnosis and Management of Patients with Thoracic Aortic Disease. Circulation. 2010; 121: A630-Z601. No definite pulmonary nodules are  noted. Coronary artery calcifications are noted suggesting coronary artery disease. Small sliding-type hiatal hernia is noted. Hepatic cirrhosis is noted with mild splenomegaly suggesting portal hypertension. Paraesophageal varices are noted as well. Aortic Atherosclerosis (ICD10-I70.0) and Emphysema (ICD10-J43.9). Electronically Signed   By: Marijo Conception, M.D.   On: 04/28/2017 11:46   Dg Chest Port 1 View  Result Date: 04/27/2017 CLINICAL DATA:  Fever EXAM: PORTABLE CHEST 1 VIEW COMPARISON:  03/02/2017 dating back to 01/31/2017 FINDINGS: The cardiopericardial silhouette is mildly enlarged, some which is likely due to portable technique and low lung volumes. The aorta is not aneurysmal. Low lung volumes without pneumonic consolidation, effusion or pneumothorax. Rounded densities about the right hilum likely reflect vessels seen on end and central vascular congestion. Pulmonary nodules are not entirely excluded but believed less likely. No pneumonic consolidation. No effusion or pneumothorax. The visualized skeletal structures are unremarkable. IMPRESSION: 1. Low lung volumes with crowding of interstitial lung markings. Mild central vascular congestion. 2. Nodular densities about the right hilum are new and likely reflect pulmonary vessels seen on end. As pulmonary nodules are not entirely excluded, follow-up radiographs with PA and lateral views or chest CT may help exclude pulmonary nodules. Electronically Signed   By: Ashley Royalty M.D.   On: 04/27/2017 20:00    Microbiology: Recent Results (from the past 240 hour(s))  Blood Culture (routine x 2)     Status: None (Preliminary result)   Collection Time: 04/27/17  6:40 PM  Result Value Ref Range Status   Specimen Description BLOOD LEFT HAND  Final   Special Requests   Final    BOTTLES DRAWN AEROBIC AND ANAEROBIC Blood Culture adequate volume   Culture NO GROWTH 2 DAYS  Final   Report Status PENDING  Incomplete  Blood Culture (routine x 2)      Status: None (Preliminary result)   Collection Time: 04/27/17  6:55 PM  Result Value Ref Range Status   Specimen Description BLOOD BLOOD RIGHT FOREARM  Final   Special Requests IN PEDIATRIC BOTTLE Blood Culture adequate volume  Final   Culture NO GROWTH 2 DAYS  Final   Report Status PENDING  Incomplete  Urine culture     Status: None   Collection Time: 04/27/17  8:15 PM  Result Value Ref Range Status   Specimen Description URINE, CATHETERIZED  Final   Special Requests NONE  Final   Culture NO GROWTH  Final   Report Status 04/29/2017 FINAL  Final  MRSA PCR Screening     Status: None   Collection Time: 04/27/17 11:36 PM  Result Value Ref Range Status   MRSA by PCR NEGATIVE NEGATIVE Final    Comment:        The GeneXpert MRSA Assay (  FDA approved for NASAL specimens only), is one component of a comprehensive MRSA colonization surveillance program. It is not intended to diagnose MRSA infection nor to guide or monitor treatment for MRSA infections.   Respiratory Panel by PCR     Status: None   Collection Time: 04/28/17  9:58 AM  Result Value Ref Range Status   Adenovirus NOT DETECTED NOT DETECTED Final   Coronavirus 229E NOT DETECTED NOT DETECTED Final   Coronavirus HKU1 NOT DETECTED NOT DETECTED Final   Coronavirus NL63 NOT DETECTED NOT DETECTED Final   Coronavirus OC43 NOT DETECTED NOT DETECTED Final   Metapneumovirus NOT DETECTED NOT DETECTED Final   Rhinovirus / Enterovirus NOT DETECTED NOT DETECTED Final   Influenza A NOT DETECTED NOT DETECTED Final   Influenza B NOT DETECTED NOT DETECTED Final   Parainfluenza Virus 1 NOT DETECTED NOT DETECTED Final   Parainfluenza Virus 2 NOT DETECTED NOT DETECTED Final   Parainfluenza Virus 3 NOT DETECTED NOT DETECTED Final   Parainfluenza Virus 4 NOT DETECTED NOT DETECTED Final   Respiratory Syncytial Virus NOT DETECTED NOT DETECTED Final   Bordetella pertussis NOT DETECTED NOT DETECTED Final   Chlamydophila pneumoniae NOT DETECTED  NOT DETECTED Final   Mycoplasma pneumoniae NOT DETECTED NOT DETECTED Final     Labs: Basic Metabolic Panel:  Recent Labs Lab 04/27/17 1900 04/28/17 0258 04/28/17 0859 04/29/17 0915  NA 135 136  --  135  K 2.8* 2.8* 3.1* 3.3*  CL 104 103  --  105  CO2 24 24  --  23  GLUCOSE 199* 97  --  91  BUN 7 8  --  9  CREATININE 0.83 0.79  --  0.78  CALCIUM 8.9 8.2*  --  7.9*  MG 1.4*  --  1.6* 1.7   Liver Function Tests:  Recent Labs Lab 04/27/17 1900 04/28/17 0258  AST 75* 63*  ALT 52 44  ALKPHOS 165* 124  BILITOT 3.0* 2.8*  PROT 7.0 6.2*  ALBUMIN 2.4* 2.1*   No results for input(s): LIPASE, AMYLASE in the last 168 hours.  Recent Labs Lab 04/27/17 1902 04/28/17 0859 04/29/17 0317  AMMONIA 87* 67* 43*   CBC:  Recent Labs Lab 04/27/17 1900 04/28/17 0258 04/29/17 0915  WBC 13.5* 11.5* 5.8  NEUTROABS 11.4*  --   --   HGB 12.8* 11.7* 11.8*  HCT 37.0* 34.5* 35.6*  MCV 97.4 97.7 99.4  PLT PLATELET CLUMPS NOTED ON SMEAR, COUNT APPEARS ADEQUATE 42* 39*   Cardiac Enzymes: No results for input(s): CKTOTAL, CKMB, CKMBINDEX, TROPONINI in the last 168 hours. BNP: BNP (last 3 results)  Recent Labs  03/02/17 1009  BNP 54.0    ProBNP (last 3 results) No results for input(s): PROBNP in the last 8760 hours.  CBG:  Recent Labs Lab 04/29/17 0748 04/29/17 1150 04/29/17 1550 04/29/17 2143 04/30/17 0806  GLUCAP 82 158* 199* 193* 117*       Signed:  Dia Crawford, MD [facility] [phone] pager

## 2017-05-02 ENCOUNTER — Ambulatory Visit (INDEPENDENT_AMBULATORY_CARE_PROVIDER_SITE_OTHER): Payer: PPO | Admitting: Internal Medicine

## 2017-05-02 ENCOUNTER — Encounter: Payer: Self-pay | Admitting: Internal Medicine

## 2017-05-02 VITALS — BP 108/60 | HR 78 | Temp 97.6°F | Wt 287.0 lb

## 2017-05-02 DIAGNOSIS — Z794 Long term (current) use of insulin: Secondary | ICD-10-CM | POA: Diagnosis not present

## 2017-05-02 DIAGNOSIS — I712 Thoracic aortic aneurysm, without rupture: Secondary | ICD-10-CM | POA: Diagnosis not present

## 2017-05-02 DIAGNOSIS — K7581 Nonalcoholic steatohepatitis (NASH): Secondary | ICD-10-CM | POA: Diagnosis not present

## 2017-05-02 DIAGNOSIS — A419 Sepsis, unspecified organism: Secondary | ICD-10-CM

## 2017-05-02 DIAGNOSIS — I509 Heart failure, unspecified: Secondary | ICD-10-CM | POA: Diagnosis not present

## 2017-05-02 DIAGNOSIS — E119 Type 2 diabetes mellitus without complications: Secondary | ICD-10-CM | POA: Diagnosis not present

## 2017-05-02 DIAGNOSIS — I7121 Aneurysm of the ascending aorta, without rupture: Secondary | ICD-10-CM

## 2017-05-02 LAB — CULTURE, BLOOD (ROUTINE X 2)
CULTURE: NO GROWTH
Culture: NO GROWTH
Special Requests: ADEQUATE
Special Requests: ADEQUATE

## 2017-05-07 NOTE — Progress Notes (Signed)
Cardiology Office Note   Date:  05/08/2017   ID:  Adam, Butler 22-Mar-1956, MRN [id number]  PCP:  [name], NP    No chief complaint on file. CAD   Wt Readings from Last 3 Encounters:  05/08/17 285 lb 3.2 oz (129.4 kg)  05/02/17 287 lb (130.2 kg)  04/30/17 274 lb 14.6 oz (124.7 kg)       History of Present Illness: Adam Butler is a 61 y.o. male  With DM, NASH/cirrhosis, espohageal varices s/p banding Who has a h/o CAD and inferior MI in 12/11.  BMS was placed in the circumflex at that time.  Repeat cath in 4/16 showed CTO of RCA.    Managing liver disease with lactulose.  Follows with Dr. Hilarie Fredrickson.    Denies : Chest pain. Dizziness. Leg edema. Nitroglycerin use. Orthopnea. Palpitations. Paroxysmal nocturnal dyspnea. Shortness of breath. Syncope.   He walks some, but not regularly.    He was admitted for bacteremia in 10/18.  He had a chest CT showing a small thoracic aortic aneurysm.  He has f/u with Dr. Prescott Gum.    Past Medical History:  Diagnosis Date  . Arthritis   . Cholelithiasis   . Cirrhosis ([place]) 2011   Cryptogenic, Likely NASH. Family/pt deny EtOH. HCV, HBV, HAV negative. ANA negative. AMA positive. Ascites 12/11  . Coronary artery disease    Inferior MI 12/11; LHC with occluded mid CFX and 80% proximal RCA. EF 55%. He had 3.0 x 28 vision BMS to CFX  . Diastolic CHF, acute ([place])    Echo 12/11 with ef 50-55% and mild LVH. EF 55% by LV0gram in 12/11  . Esophageal varices ([place]) 2011, 2013   no hx acute variceal bleed  . Hepatic encephalopathy ([employer]) 2011, 12/2013  . Hyperlipemia   . Myocardial infarction [facility]) ? 2012  . PFO (patent foramen ovale): Per TEE 04/20/2015 04/20/2015  . Portal hypertension ([place])   . Rectal varices   . S/P coronary artery stent placement 06/2010  . SVT (supraventricular tachycardia) ([place])    1/12: appeared to be an ectopic atrial tachycardia. Required DCCV with hemodynamic instability  . Type II diabetes  mellitus ([place]) 2011    Past Surgical History:  Procedure Laterality Date  . APPENDECTOMY    . CARDIAC CATHETERIZATION  07/01/2010   BMS to CFX.  Marland Kitchen CATARACT EXTRACTION W/PHACO Left 04/28/2016   Procedure: CATARACT EXTRACTION PHACO AND INTRAOCULAR LENS PLACEMENT (IOC);  Surgeon: Leandrew Koyanagi, MD;  Location: [facility] ORS;  Service: Ophthalmology;  Laterality: Left;  Lot# [id number] H [place]: 05:31.7 AP%: 28.2 CDE: 93.48   . COLONOSCOPY  04/13/2012   Procedure: COLONOSCOPY;  Surgeon: Inda Castle, MD;  Location: [facility] ENDOSCOPY;  Service: Endoscopy;  Laterality: N/A;  . COLONOSCOPY WITH PROPOFOL N/A 03/22/2017   Procedure: COLONOSCOPY WITH PROPOFOL;  Surgeon: Doran Stabler, MD;  Location: [facility] ENDOSCOPY;  Service: Gastroenterology;  Laterality: N/A;  . CORONARY ANGIOPLASTY    . CORONARY STENT PLACEMENT  06/30/2010   CFX   Distal        . ESOPHAGEAL BANDING N/A 01/01/2014   Procedure: ESOPHAGEAL BANDING;  Surgeon: Jerene Bears, MD;  Location: [facility];  Service: Endoscopy;  Laterality: N/A;  . ESOPHAGOGASTRODUODENOSCOPY  04/13/2012   Procedure: ESOPHAGOGASTRODUODENOSCOPY (EGD);  Surgeon: Inda Castle, MD;  Location: [facility];  Service: Endoscopy;  Laterality: N/A;  . ESOPHAGOGASTRODUODENOSCOPY N/A 01/01/2014   Procedure: ESOPHAGOGASTRODUODENOSCOPY (EGD);  Surgeon: Jerene Bears, MD;  Location:  [facility];  Service: Endoscopy;  Laterality: N/A;  . ESOPHAGOGASTRODUODENOSCOPY (EGD) WITH PROPOFOL N/A 07/12/2016   Procedure: ESOPHAGOGASTRODUODENOSCOPY (EGD) WITH PROPOFOL;  Surgeon: Jerene Bears, MD;  Location: [facility];  Service: Gastroenterology;  Laterality: N/A;  . LEFT HEART CATHETERIZATION WITH CORONARY ANGIOGRAM N/A 10/15/2014   Procedure: LEFT HEART CATHETERIZATION WITH CORONARY ANGIOGRAM;  Surgeon: Peter M Martinique, MD;  Location: [facility] CATH LAB;  Service: Cardiovascular;  Laterality: N/A;  . orif r leg    . TEE WITHOUT CARDIOVERSION N/A 04/20/2015   Procedure: TRANSESOPHAGEAL  ECHOCARDIOGRAM (TEE);  Surgeon: Fay Records, MD;  Location: [facility] ENDOSCOPY;  Service: Cardiovascular;  Laterality: N/A;     Current Outpatient Prescriptions  Medication Sig Dispense Refill  . Aspirin-Calcium Carbonate (BAYER WOMENS) 81-777 MG TABS Take 81 mg by mouth daily.    Marland Kitchen atorvastatin (LIPITOR) 10 MG tablet Take 10 mg by mouth daily.    . cyanocobalamin 1000 MCG tablet Take 1,000 mcg by mouth daily. Vitamin B12    . fluticasone (FLONASE) 50 MCG/ACT nasal spray Place 2 sprays into both nostrils 3 (three) times daily as needed for allergies or rhinitis (congestion).    Marland Kitchen glipiZIDE (GLUCOTROL) 10 MG tablet Take 10 mg by mouth 2 (two) times daily before a meal.    . Insulin Glargine (LANTUS) 100 UNIT/ML Solostar Pen Inject 8 Units into the skin daily at 10 pm. 15 mL 0  . Insulin Pen Needle (PEN NEEDLES) 31G X 8 MM MISC 8 Units by Does not apply route at bedtime. 100 each 0  . insulin regular (NOVOLIN R,HUMULIN R) 100 units/mL injection Inject 25-80 Units into the skin 3 (three) times daily as needed for high blood sugar (CBG >150).    . lactulose (CHRONULAC) 10 GM/15ML solution Take 30 mLs (20 g total) by mouth at bedtime. 240 mL 0  . Multiple Vitamin (MULTIVITAMIN WITH MINERALS) TABS tablet Take 1 tablet by mouth daily.    . nitroGLYCERIN (NITROSTAT) 0.4 MG SL tablet Place 1 tablet (0.4 mg total) under the tongue every 5 (five) minutes as needed for chest pain (chest pain). 25 tablet 5  . PARoxetine (PAXIL) 10 MG tablet Take 1 tablet (10 mg total) by mouth daily. 30 tablet 5  . ranitidine (ZANTAC) 150 MG tablet Take 150 mg by mouth See admin instructions. Take 1 tablet (150 mg) by mouth every morning, may also take 1 tablet at night as needed for acid reflux     No current facility-administered medications for this visit.     Allergies:   Isosorbide nitrate    Social History:  The patient  reports that he has been smoking E-cigarettes and Cigarettes.  He has a 9.00 pack-year smoking  history. He has never used smokeless tobacco. He reports that he does not drink alcohol or use drugs.   Family History:  The patient's family history includes COPD in his father and mother; Diabetes in his brother and father; Heart attack in his brother and father; Heart attack (age of onset: 101) in his brother.    ROS:  Please see the history of present illness.   Otherwise, review of systems are positive for recent hospital stay.   All other systems are reviewed and negative.    PHYSICAL EXAM: VS:  BP 110/62   Pulse 80   Ht 5' 7"  (1.702 m)   Wt 285 lb 3.2 oz (129.4 kg)   SpO2 98%   BMI 44.67 kg/m  , BMI Body mass index is 44.67  kg/m. GEN: Well nourished, well developed, in no acute distress  HEENT: normal  Neck: no JVD, carotid bruits, or masses Cardiac: RRR; no murmurs, rubs, or gallops,; 2+ LE edema bilaterally Respiratory:  clear to auscultation bilaterally, normal work of breathing GI: soft, nontender, nondistended, + BS, obese MS: no deformity or atrophy  Skin: warm and dry, no rash Neuro:  Strength and sensation are intact Psych: euthymic mood, full affect   EKG:   The ekg ordered today demonstrates NSR, nonspecific ST changes   Recent Labs: 03/02/2017: B Natriuretic Peptide 54.0 04/28/2017: ALT 44 04/29/2017: BUN 9; Creatinine, Ser 0.78; Hemoglobin 11.8; Magnesium 1.7; Platelets 39; Potassium 3.3; Sodium 135   Lipid Panel    Component Value Date/Time   CHOL 133 11/17/2016 0854   TRIG 133.0 11/17/2016 0854   HDL 39.90 11/17/2016 0854   CHOLHDL 3 11/17/2016 0854   VLDL 26.6 11/17/2016 0854   LDLCALC 67 11/17/2016 0854     Other studies Reviewed: Additional studies/ records that were reviewed today with results demonstrating: labs reviewed chest CT.   ASSESSMENT AND PLAN:  1. CAD: No angina on medical therapy.  COntinue aggressive secondary prevention.  Hyperlipidemia well controlled.  2. LE edema/Chronic diastolic heart failure:   Aldactone was stopped in  the hospital.  Will restart home dose, 12.5 mg daily.  BP stable at home in the 488-891 systolic.  Consider furosemide 40 mg daily and KCl 20 mEq if edema persists.  Recheck electrolytes in a week. 3. DM: He is trying to control with meds and a healthy lifestyle.  F/u with [name].  4. Liver disease: f/u with Dr. Hilarie Fredrickson.  He states things are stable. 5. THoracic aneurysm: 4.1 cm.  Plan for serial CT scans.   Current medicines are reviewed at length with the patient today.  The patient concerns regarding his medicines were addressed.  The following changes have been made:  No change  Labs/ tests ordered today include:  No orders of the defined types were placed in this encounter.   Recommend 150 minutes/week of aerobic exercise Low fat, low carb, high fiber diet recommended  Disposition:   FU in 6 months   Signed, Larae Grooms, MD  05/08/2017 11:12 AM    [address], [place], [place]  [address] Phone: [phone]; Fax: [phone]

## 2017-05-08 ENCOUNTER — Ambulatory Visit (INDEPENDENT_AMBULATORY_CARE_PROVIDER_SITE_OTHER): Payer: PPO | Admitting: Interventional Cardiology

## 2017-05-08 ENCOUNTER — Encounter: Payer: Self-pay | Admitting: Interventional Cardiology

## 2017-05-08 ENCOUNTER — Encounter: Payer: Self-pay | Admitting: Internal Medicine

## 2017-05-08 VITALS — BP 110/62 | HR 80 | Ht 67.0 in | Wt 285.2 lb

## 2017-05-08 DIAGNOSIS — E7800 Pure hypercholesterolemia, unspecified: Secondary | ICD-10-CM | POA: Diagnosis not present

## 2017-05-08 DIAGNOSIS — I5032 Chronic diastolic (congestive) heart failure: Secondary | ICD-10-CM

## 2017-05-08 DIAGNOSIS — I7121 Aneurysm of the ascending aorta, without rupture: Secondary | ICD-10-CM

## 2017-05-08 DIAGNOSIS — I25119 Atherosclerotic heart disease of native coronary artery with unspecified angina pectoris: Secondary | ICD-10-CM

## 2017-05-08 DIAGNOSIS — I712 Thoracic aortic aneurysm, without rupture: Secondary | ICD-10-CM | POA: Diagnosis not present

## 2017-05-08 MED ORDER — SPIRONOLACTONE 25 MG PO TABS: 12.5000 mg | ORAL_TABLET | Freq: Every day | ORAL | 3 refills | Status: DC

## 2017-05-08 NOTE — Progress Notes (Signed)
Subjective:    Patient ID: Adam Butler, male    DOB: 18-Jan-1956, 61 y.o.   MRN: [id number]  HPI  Pt presents to the clinic today for [facility] Followup. He went to the ER 04/27/17 with c/o AMS and fever. His WBC was initially elevated at 13.5, ammonia was elevate. He was hypotensive. He was given IV fluids, IV antibiotics. His Lactulose was increased. His Lasix, Spironolactone and Nadolol were stopped during admission. Chest xray, CT head were negative. CT chest showed a 4.1 cm thoracic aneurysm, which was a new finding. Lantus was added to his diabetes regimen, but he reports he has not started this because they did not send in the needles. He was discharged on 10/21. Since discharge, he reports he has been doing well. He denies running fevers. He reports his blood pressure and blood sugars are running on the lower end, but he has not felt syncopal, had chest pain, chest tightness or hypoglycemic episodes.   Review of Systems      Past Medical History:  Diagnosis Date  . Arthritis   . Cholelithiasis   . Cirrhosis ([place]) 2011   Cryptogenic, Likely NASH. Family/pt deny EtOH. HCV, HBV, HAV negative. ANA negative. AMA positive. Ascites 12/11  . Coronary artery disease    Inferior MI 12/11; LHC with occluded mid CFX and 80% proximal RCA. EF 55%. He had 3.0 x 28 vision BMS to CFX  . Diastolic CHF, acute ([place])    Echo 12/11 with ef 50-55% and mild LVH. EF 55% by LV0gram in 12/11  . Esophageal varices ([place]) 2011, 2013   no hx acute variceal bleed  . Hepatic encephalopathy ([place]) 2011, 12/2013  . Hyperlipemia   . Myocardial infarction [facility]) ? 2012  . PFO (patent foramen ovale): Per TEE 04/20/2015 04/20/2015  . Portal hypertension ([place])   . Rectal varices   . S/P coronary artery stent placement 06/2010  . SVT (supraventricular tachycardia) ([place])    1/12: appeared to be an ectopic atrial tachycardia. Required DCCV with hemodynamic instability  . Type II diabetes mellitus ([place]) 2011     Current Outpatient Prescriptions  Medication Sig Dispense Refill  . Aspirin-Calcium Carbonate (BAYER WOMENS) 81-777 MG TABS Take 81 mg by mouth daily.    Marland Kitchen atorvastatin (LIPITOR) 10 MG tablet TAKE 1 TABLET BY MOUTH ONCE DAILY (Patient taking differently: TAKE 10 mg TABLET BY MOUTH ONCE DAILY) 90 tablet 2  . cyanocobalamin 1000 MCG tablet Take 1,000 mcg by mouth daily. Vitamin B12    . fluticasone (FLONASE) 50 MCG/ACT nasal spray Place 2 sprays into both nostrils 3 (three) times daily as needed for allergies or rhinitis (congestion).    Marland Kitchen glipiZIDE (GLUCOTROL) 10 MG tablet TAKE 1 TABLET BY MOUTH TWICE DAILY BEFORE  A  MEAL (Patient taking differently: TAKE 10 mg TABLET BY MOUTH TWICE DAILY BEFORE  A  MEAL) 180 tablet 0  . Insulin Glargine (LANTUS) 100 UNIT/ML Solostar Pen Inject 8 Units into the skin daily at 10 pm. 15 mL 0  . Insulin Pen Needle (PEN NEEDLES) 31G X 8 MM MISC 8 Units by Does not apply route at bedtime. 100 each 0  . insulin regular (NOVOLIN R,HUMULIN R) 100 units/mL injection Inject 25-80 Units into the skin 3 (three) times daily as needed for high blood sugar (CBG >150).    . lactulose (CHRONULAC) 10 GM/15ML solution Take 30 mLs (20 g total) by mouth at bedtime. 240 mL 0  . Multiple Vitamin (MULTIVITAMIN WITH MINERALS)  TABS tablet Take 1 tablet by mouth daily.    . nitroGLYCERIN (NITROSTAT) 0.4 MG SL tablet Place 1 tablet (0.4 mg total) under the tongue every 5 (five) minutes as needed for chest pain (chest pain). 25 tablet 5  . PARoxetine (PAXIL) 10 MG tablet Take 1 tablet (10 mg total) by mouth daily. 30 tablet 5  . ranitidine (ZANTAC) 150 MG tablet Take 150 mg by mouth See admin instructions. Take 1 tablet (150 mg) by mouth every morning, may also take 1 tablet at night as needed for acid reflux     No current facility-administered medications for this visit.     Allergies  Allergen Reactions  . Isosorbide Nitrate Other (See Comments)    Nose bleeds    Family History   Problem Relation Age of Onset  . Heart attack Brother 59       MI  . Diabetes Brother   . Heart attack Father   . Diabetes Father   . COPD Father   . COPD Mother   . Heart attack Brother   . Stroke Neg Hx     Social History   Social History  . Marital status: Married    Spouse name: N/A  . Number of children: 3  . Years of education: N/A   Occupational History  . Unemployed Other    Worked in maintenance prior   Social History Main Topics  . Smoking status: Current Every Day Smoker    Packs/day: 0.25    Years: 36.00    Types: E-cigarettes, Cigarettes  . Smokeless tobacco: Never Used     Comment: uses vapor cigarettes (2016 ); I smoke about 4 cigarettes a day  . Alcohol use No  . Drug use: No  . Sexual activity: No   Other Topics Concern  . Not on file   Social History Narrative   Married   Gets regular exercise: walking     Constitutional: Denies fever, malaise, fatigue, headache or abrupt weight changes.  Respiratory: Denies difficulty breathing, shortness of breath, cough or sputum production.   Cardiovascular: Denies chest pain, chest tightness, palpitations or swelling in the hands or feet.  Gastrointestinal: Pt reports loose stools (on Lactulose). Denies abdominal pain, bloating, constipation, diarrhea or blood in the stool.  Neurological: Denies dizziness, difficulty with memory, difficulty with speech or problems with balance and coordination.  Psych: Denies anxiety, depression, SI/HI.  No other specific complaints in a complete review of systems (except as listed in HPI above).  Objective:   Physical Exam   BP 108/60   Pulse 78   Temp 97.6 F (36.4 C) (Oral)   Wt 287 lb (130.2 kg)   SpO2 98%   BMI 44.95 kg/m  Wt Readings from Last 3 Encounters:  05/02/17 287 lb (130.2 kg)  04/30/17 274 lb 14.6 oz (124.7 kg)  04/06/17 276 lb (125.2 kg)    General: Appears his stated age, obese in NAD. Cardiovascular: Normal rate and rhythm. S1,S2 noted.   Murmur noted. 1+ pitting BLE edema. Pulmonary/Chest: Normal effort and positive vesicular breath sounds. No respiratory distress. No wheezes, rales or ronchi noted.  Abdomen: Soft and nontender. Normal bowel sounds.  Neurological: Alert and oriented.   BMET    Component Value Date/Time   NA 135 04/29/2017 0915   K 3.3 (L) 04/29/2017 0915   CL 105 04/29/2017 0915   CO2 23 04/29/2017 0915   GLUCOSE 91 04/29/2017 0915   BUN 9 04/29/2017 0915   CREATININE 0.78  04/29/2017 0915   CREATININE 0.74 03/15/2011 0827   CALCIUM 7.9 (L) 04/29/2017 0915   GFRNONAA >60 04/29/2017 0915   GFRAA >60 04/29/2017 0915    Lipid Panel     Component Value Date/Time   CHOL 133 11/17/2016 0854   TRIG 133.0 11/17/2016 0854   HDL 39.90 11/17/2016 0854   CHOLHDL 3 11/17/2016 0854   VLDL 26.6 11/17/2016 0854   LDLCALC 67 11/17/2016 0854    CBC    Component Value Date/Time   WBC 5.8 04/29/2017 0915   RBC 3.58 (L) 04/29/2017 0915   HGB 11.8 (L) 04/29/2017 0915   HCT 35.6 (L) 04/29/2017 0915   PLT 39 (L) 04/29/2017 0915   MCV 99.4 04/29/2017 0915   MCH 33.0 04/29/2017 0915   MCHC 33.1 04/29/2017 0915   RDW 14.7 04/29/2017 0915   LYMPHSABS 0.8 04/27/2017 1900   MONOABS 1.1 (H) 04/27/2017 1900   EOSABS 0.1 04/27/2017 1900   BASOSABS 0.0 04/27/2017 1900    Hgb A1C Lab Results  Component Value Date   HGBA1C 9.5 (H) 02/24/2017           Assessment & Plan:   [facility] Follow Up for Sepsis, NASH, Thoracic Aneurysm, DM 2, CHF:  Hospital notes, labs and imaging reviewed BP still low, will not resume beta blocker or diuretics at this time He has a followup with cardiology next week He declines referral to CT surgery, he would rather his cardiologist place referral if needed Discussed risk of rupture Will send in pen needles for him, advised him to start the Lantus and to monitor sugars carefully Continue all other home meds as listed on discharge summary  Return precautions  discussed, RTC in 1 month for follow up of chronic conditions. [name], NP

## 2017-05-08 NOTE — Patient Instructions (Signed)
Abdominal Aortic Aneurysm Blood pumps away from the heart through tubes (blood vessels) called arteries. Aneurysms are weak or damaged places in the wall of an artery. It bulges out like a balloon. An abdominal aortic aneurysm happens in the main artery of the body (aorta). It can burst or tear, causing bleeding inside the body. This is an emergency. It needs treatment right away. What are the causes? The exact cause is unknown. Things that could cause this problem include:  Fat and other substances building up in the lining of a tube.  Swelling of the walls of a blood vessel.  Certain tissue diseases.  Belly (abdominal) trauma.  An infection in the main artery of the body.  What increases the risk? There are things that make it more likely for you to have an aneurysm. These include:  Being over the age of 61 years old.  Having high blood pressure (hypertension).  Being a male.  Being white.  Being very overweight (obese).  Having a family history of aneurysm.  Using tobacco products.  What are the signs or symptoms? Symptoms depend on the size of the aneurysm and how fast it grows. There may not be symptoms. If symptoms occur, they can include:  Pain (belly, side, lower back, or groin).  Feeling full after eating a small amount of food.  Feeling sick to your stomach (nauseous), throwing up (vomiting), or both.  Feeling a lump in your belly that feels like it is beating (pulsating).  Feeling like you will pass out (faint).  How is this treated?  Medicine to control blood pressure and pain.  Imaging tests to see if the aneurysm gets bigger.  Surgery. How is this prevented? To lessen your chance of getting this condition:  Stop smoking. Stop chewing tobacco.  Limit or avoid alcohol.  Keep your blood pressure, blood sugar, and cholesterol within normal limits.  Eat less salt.  Eat foods low in saturated fats and cholesterol. These are found in animal and  whole dairy products.  Eat more fiber. Fiber is found in whole grains, vegetables, and fruits.  Keep a healthy weight.  Stay active and exercise often.  This information is not intended to replace advice given to you by your health care provider. Make sure you discuss any questions you have with your health care provider. Document Released: 10/22/2012 Document Revised: 12/03/2015 Document Reviewed: 07/27/2012 Elsevier Interactive Patient Education  2017 [employer].

## 2017-05-08 NOTE — Patient Instructions (Signed)
Medication Instructions:  Your physician has recommended you make the following change in your medication:   START: spirolactone 12.5 mg daily    Labwork: Your physician recommends that you return for lab work in: 1 week for a basic metabolic panel    Testing/Procedures: None ordered   Follow-Up: Your physician wants you to follow-up in: 6 months with Dr. Irish Lack. You will receive a reminder letter in the mail two months in advance. If you don't receive a letter, please call our office to schedule the follow-up appointment.   Any Other Special Instructions Will Be Listed Below (If Applicable).     If you need a refill on your cardiac medications before your next appointment, please call your pharmacy.

## 2017-05-12 ENCOUNTER — Inpatient Hospital Stay (HOSPITAL_COMMUNITY): Payer: PPO

## 2017-05-12 ENCOUNTER — Emergency Department (HOSPITAL_COMMUNITY): Payer: PPO

## 2017-05-12 ENCOUNTER — Encounter (HOSPITAL_COMMUNITY): Payer: Self-pay

## 2017-05-12 ENCOUNTER — Inpatient Hospital Stay (HOSPITAL_COMMUNITY)
Admission: EM | Admit: 2017-05-12 | Discharge: 2017-05-18 | DRG: 871 | Disposition: A | Discharge status: Home Health | Payer: PPO | Attending: Nephrology | Admitting: Nephrology

## 2017-05-12 DIAGNOSIS — E118 Type 2 diabetes mellitus with unspecified complications: Secondary | ICD-10-CM | POA: Diagnosis not present

## 2017-05-12 DIAGNOSIS — Z825 Family history of asthma and other chronic lower respiratory diseases: Secondary | ICD-10-CM

## 2017-05-12 DIAGNOSIS — A09 Infectious gastroenteritis and colitis, unspecified: Secondary | ICD-10-CM | POA: Diagnosis not present

## 2017-05-12 DIAGNOSIS — A498 Other bacterial infections of unspecified site: Secondary | ICD-10-CM | POA: Diagnosis not present

## 2017-05-12 DIAGNOSIS — R3129 Other microscopic hematuria: Secondary | ICD-10-CM | POA: Diagnosis present

## 2017-05-12 DIAGNOSIS — R7881 Bacteremia: Secondary | ICD-10-CM | POA: Diagnosis not present

## 2017-05-12 DIAGNOSIS — K7290 Hepatic failure, unspecified without coma: Secondary | ICD-10-CM | POA: Diagnosis present

## 2017-05-12 DIAGNOSIS — I330 Acute and subacute infective endocarditis: Secondary | ICD-10-CM

## 2017-05-12 DIAGNOSIS — I110 Hypertensive heart disease with heart failure: Secondary | ICD-10-CM | POA: Diagnosis present

## 2017-05-12 DIAGNOSIS — A491 Streptococcal infection, unspecified site: Secondary | ICD-10-CM | POA: Diagnosis not present

## 2017-05-12 DIAGNOSIS — F17210 Nicotine dependence, cigarettes, uncomplicated: Secondary | ICD-10-CM | POA: Diagnosis present

## 2017-05-12 DIAGNOSIS — I868 Varicose veins of other specified sites: Secondary | ICD-10-CM | POA: Diagnosis not present

## 2017-05-12 DIAGNOSIS — K8020 Calculus of gallbladder without cholecystitis without obstruction: Secondary | ICD-10-CM | POA: Diagnosis present

## 2017-05-12 DIAGNOSIS — E876 Hypokalemia: Secondary | ICD-10-CM

## 2017-05-12 DIAGNOSIS — I509 Heart failure, unspecified: Secondary | ICD-10-CM | POA: Diagnosis not present

## 2017-05-12 DIAGNOSIS — A419 Sepsis, unspecified organism: Secondary | ICD-10-CM | POA: Diagnosis not present

## 2017-05-12 DIAGNOSIS — K652 Spontaneous bacterial peritonitis: Secondary | ICD-10-CM | POA: Diagnosis not present

## 2017-05-12 DIAGNOSIS — I252 Old myocardial infarction: Secondary | ICD-10-CM

## 2017-05-12 DIAGNOSIS — I2510 Atherosclerotic heart disease of native coronary artery without angina pectoris: Secondary | ICD-10-CM | POA: Diagnosis not present

## 2017-05-12 DIAGNOSIS — D696 Thrombocytopenia, unspecified: Secondary | ICD-10-CM | POA: Diagnosis not present

## 2017-05-12 DIAGNOSIS — R319 Hematuria, unspecified: Secondary | ICD-10-CM | POA: Diagnosis not present

## 2017-05-12 DIAGNOSIS — I8510 Secondary esophageal varices without bleeding: Secondary | ICD-10-CM | POA: Diagnosis not present

## 2017-05-12 DIAGNOSIS — D61818 Other pancytopenia: Secondary | ICD-10-CM | POA: Diagnosis present

## 2017-05-12 DIAGNOSIS — D649 Anemia, unspecified: Secondary | ICD-10-CM | POA: Diagnosis not present

## 2017-05-12 DIAGNOSIS — R188 Other ascites: Secondary | ICD-10-CM | POA: Diagnosis not present

## 2017-05-12 DIAGNOSIS — R197 Diarrhea, unspecified: Secondary | ICD-10-CM

## 2017-05-12 DIAGNOSIS — K766 Portal hypertension: Secondary | ICD-10-CM | POA: Diagnosis present

## 2017-05-12 DIAGNOSIS — I712 Thoracic aortic aneurysm, without rupture: Secondary | ICD-10-CM | POA: Diagnosis not present

## 2017-05-12 DIAGNOSIS — R509 Fever, unspecified: Secondary | ICD-10-CM | POA: Diagnosis not present

## 2017-05-12 DIAGNOSIS — E785 Hyperlipidemia, unspecified: Secondary | ICD-10-CM | POA: Diagnosis present

## 2017-05-12 DIAGNOSIS — Z955 Presence of coronary angioplasty implant and graft: Secondary | ICD-10-CM | POA: Diagnosis not present

## 2017-05-12 DIAGNOSIS — E119 Type 2 diabetes mellitus without complications: Secondary | ICD-10-CM | POA: Diagnosis not present

## 2017-05-12 DIAGNOSIS — E7220 Disorder of urea cycle metabolism, unspecified: Secondary | ICD-10-CM | POA: Diagnosis not present

## 2017-05-12 DIAGNOSIS — E871 Hypo-osmolality and hyponatremia: Secondary | ICD-10-CM | POA: Diagnosis not present

## 2017-05-12 DIAGNOSIS — Z888 Allergy status to other drugs, medicaments and biological substances status: Secondary | ICD-10-CM

## 2017-05-12 DIAGNOSIS — Q211 Atrial septal defect: Secondary | ICD-10-CM | POA: Diagnosis not present

## 2017-05-12 DIAGNOSIS — A408 Other streptococcal sepsis: Principal | ICD-10-CM | POA: Diagnosis present

## 2017-05-12 DIAGNOSIS — IMO0002 Reserved for concepts with insufficient information to code with codable children: Secondary | ICD-10-CM

## 2017-05-12 DIAGNOSIS — Z6841 Body Mass Index (BMI) 40.0 and over, adult: Secondary | ICD-10-CM

## 2017-05-12 DIAGNOSIS — D122 Benign neoplasm of ascending colon: Secondary | ICD-10-CM | POA: Diagnosis present

## 2017-05-12 DIAGNOSIS — Z8249 Family history of ischemic heart disease and other diseases of the circulatory system: Secondary | ICD-10-CM

## 2017-05-12 DIAGNOSIS — Z833 Family history of diabetes mellitus: Secondary | ICD-10-CM

## 2017-05-12 DIAGNOSIS — E1165 Type 2 diabetes mellitus with hyperglycemia: Secondary | ICD-10-CM | POA: Diagnosis not present

## 2017-05-12 DIAGNOSIS — K219 Gastro-esophageal reflux disease without esophagitis: Secondary | ICD-10-CM | POA: Diagnosis not present

## 2017-05-12 DIAGNOSIS — I350 Nonrheumatic aortic (valve) stenosis: Secondary | ICD-10-CM | POA: Diagnosis not present

## 2017-05-12 DIAGNOSIS — Z7951 Long term (current) use of inhaled steroids: Secondary | ICD-10-CM

## 2017-05-12 DIAGNOSIS — K7460 Unspecified cirrhosis of liver: Secondary | ICD-10-CM | POA: Diagnosis not present

## 2017-05-12 DIAGNOSIS — K7469 Other cirrhosis of liver: Secondary | ICD-10-CM | POA: Diagnosis not present

## 2017-05-12 DIAGNOSIS — I5032 Chronic diastolic (congestive) heart failure: Secondary | ICD-10-CM | POA: Diagnosis present

## 2017-05-12 DIAGNOSIS — Z794 Long term (current) use of insulin: Secondary | ICD-10-CM | POA: Diagnosis not present

## 2017-05-12 DIAGNOSIS — I340 Nonrheumatic mitral (valve) insufficiency: Secondary | ICD-10-CM | POA: Diagnosis not present

## 2017-05-12 DIAGNOSIS — K7581 Nonalcoholic steatohepatitis (NASH): Secondary | ICD-10-CM | POA: Diagnosis present

## 2017-05-12 DIAGNOSIS — Z79899 Other long term (current) drug therapy: Secondary | ICD-10-CM | POA: Diagnosis not present

## 2017-05-12 LAB — AMMONIA: Ammonia: 80 umol/L — ABNORMAL HIGH (ref 9–35)

## 2017-05-12 LAB — COMPREHENSIVE METABOLIC PANEL
ALK PHOS: 125 U/L (ref 38–126)
ALT: 39 U/L (ref 17–63)
ANION GAP: 6 (ref 5–15)
AST: 66 U/L — ABNORMAL HIGH (ref 15–41)
Albumin: 2.1 g/dL — ABNORMAL LOW (ref 3.5–5.0)
BUN: 7 mg/dL (ref 6–20)
CHLORIDE: 104 mmol/L (ref 101–111)
CO2: 24 mmol/L (ref 22–32)
Calcium: 7.1 mg/dL — ABNORMAL LOW (ref 8.9–10.3)
Creatinine, Ser: 0.81 mg/dL (ref 0.61–1.24)
Glucose, Bld: 180 mg/dL — ABNORMAL HIGH (ref 65–99)
Potassium: 2.8 mmol/L — ABNORMAL LOW (ref 3.5–5.1)
SODIUM: 134 mmol/L — AB (ref 135–145)
Total Bilirubin: 2.4 mg/dL — ABNORMAL HIGH (ref 0.3–1.2)
Total Protein: 6 g/dL — ABNORMAL LOW (ref 6.5–8.1)

## 2017-05-12 LAB — CBC WITH DIFFERENTIAL/PLATELET
Basophils Absolute: 0 10*3/uL (ref 0.0–0.1)
Basophils Relative: 0 %
EOS ABS: 0 10*3/uL (ref 0.0–0.7)
Eosinophils Relative: 0 %
HCT: 30 % — ABNORMAL LOW (ref 39.0–52.0)
Hemoglobin: 10.5 g/dL — ABNORMAL LOW (ref 13.0–17.0)
LYMPHS ABS: 0.8 10*3/uL (ref 0.7–4.0)
Lymphocytes Relative: 8 %
MCH: 34.4 pg — AB (ref 26.0–34.0)
MCHC: 35 g/dL (ref 30.0–36.0)
MCV: 98.4 fL (ref 78.0–100.0)
MONOS PCT: 11 %
Monocytes Absolute: 1.1 10*3/uL — ABNORMAL HIGH (ref 0.1–1.0)
Neutro Abs: 7.7 10*3/uL (ref 1.7–7.7)
Neutrophils Relative %: 81 %
PLATELETS: 46 10*3/uL — AB (ref 150–400)
RBC: 3.05 MIL/uL — ABNORMAL LOW (ref 4.22–5.81)
RDW: 14.9 % (ref 11.5–15.5)
WBC: 9.6 10*3/uL (ref 4.0–10.5)

## 2017-05-12 LAB — PROTIME-INR
INR: 1.76
PROTHROMBIN TIME: 20.4 s — AB (ref 11.4–15.2)

## 2017-05-12 LAB — URINALYSIS, ROUTINE W REFLEX MICROSCOPIC
BACTERIA UA: NONE SEEN
BILIRUBIN URINE: NEGATIVE
Glucose, UA: NEGATIVE mg/dL
Ketones, ur: NEGATIVE mg/dL
LEUKOCYTES UA: NEGATIVE
Nitrite: NEGATIVE
PROTEIN: NEGATIVE mg/dL
SPECIFIC GRAVITY, URINE: 1.018 (ref 1.005–1.030)
SQUAMOUS EPITHELIAL / LPF: NONE SEEN
pH: 6 (ref 5.0–8.0)

## 2017-05-12 LAB — I-STAT CG4 LACTIC ACID, ED
LACTIC ACID, VENOUS: 2.01 mmol/L — AB (ref 0.5–1.9)
LACTIC ACID, VENOUS: 1.34 mmol/L (ref 0.5–1.9)

## 2017-05-12 LAB — MAGNESIUM: Magnesium: 1.3 mg/dL — ABNORMAL LOW (ref 1.7–2.4)

## 2017-05-12 LAB — CBG MONITORING, ED: Glucose-Capillary: 172 mg/dL — ABNORMAL HIGH (ref 65–99)

## 2017-05-12 MED ORDER — POTASSIUM CHLORIDE CRYS ER 20 MEQ PO TBCR
40.0000 meq | EXTENDED_RELEASE_TABLET | Freq: Once | ORAL | Status: AC
  Administered 2017-05-12: 40 meq via ORAL
  Filled 2017-05-12: qty 2

## 2017-05-12 MED ORDER — INSULIN ASPART 100 UNIT/ML ~~LOC~~ SOLN: 0.0000 [IU] | Freq: Every day | SUBCUTANEOUS | Status: DC

## 2017-05-12 MED ORDER — ADULT MULTIVITAMIN W/MINERALS CH
1.0000 | ORAL_TABLET | Freq: Every day | ORAL | Status: DC
  Administered 2017-05-13 – 2017-05-18 (×6): 1 via ORAL
  Filled 2017-05-12 (×6): qty 1

## 2017-05-12 MED ORDER — LACTULOSE 10 GM/15ML PO SOLN
30.0000 g | Freq: Once | ORAL | Status: AC
  Administered 2017-05-12: 30 g via ORAL
  Filled 2017-05-12: qty 60

## 2017-05-12 MED ORDER — ATORVASTATIN CALCIUM 10 MG PO TABS
10.0000 mg | ORAL_TABLET | Freq: Every day | ORAL | Status: DC
  Administered 2017-05-13 – 2017-05-18 (×6): 10 mg via ORAL
  Filled 2017-05-12 (×6): qty 1

## 2017-05-12 MED ORDER — ACETAMINOPHEN 325 MG PO TABS: 650.0000 mg | ORAL_TABLET | Freq: Four times a day (QID) | ORAL | Status: DC | PRN

## 2017-05-12 MED ORDER — ASPIRIN EC 81 MG PO TBEC
81.0000 mg | DELAYED_RELEASE_TABLET | Freq: Every day | ORAL | Status: DC
  Administered 2017-05-13 – 2017-05-18 (×6): 81 mg via ORAL
  Filled 2017-05-12 (×6): qty 1

## 2017-05-12 MED ORDER — SODIUM CHLORIDE 0.9% FLUSH
3.0000 mL | Freq: Two times a day (BID) | INTRAVENOUS | Status: DC
  Administered 2017-05-13 – 2017-05-18 (×9): 3 mL via INTRAVENOUS

## 2017-05-12 MED ORDER — POTASSIUM CHLORIDE 10 MEQ/100ML IV SOLN
10.0000 meq | INTRAVENOUS | Status: AC
  Administered 2017-05-12: 10 meq via INTRAVENOUS
  Filled 2017-05-12 (×3): qty 100

## 2017-05-12 MED ORDER — FAMOTIDINE 20 MG PO TABS
10.0000 mg | ORAL_TABLET | Freq: Two times a day (BID) | ORAL | Status: DC
  Administered 2017-05-12 – 2017-05-18 (×12): 10 mg via ORAL
  Filled 2017-05-12 (×12): qty 1

## 2017-05-12 MED ORDER — SODIUM CHLORIDE 0.9 % IV BOLUS (SEPSIS): 1000.0000 mL | Freq: Once | INTRAVENOUS | Status: DC

## 2017-05-12 MED ORDER — LACTULOSE 10 GM/15ML PO SOLN
20.0000 g | Freq: Every day | ORAL | Status: DC
  Administered 2017-05-13 – 2017-05-15 (×2): 20 g via ORAL
  Filled 2017-05-12 (×6): qty 30

## 2017-05-12 MED ORDER — SODIUM CHLORIDE 0.9 % IV SOLN: 250.0000 mL | INTRAVENOUS | Status: DC | PRN

## 2017-05-12 MED ORDER — PIPERACILLIN-TAZOBACTAM 3.375 G IVPB 30 MIN
3.3750 g | Freq: Once | INTRAVENOUS | Status: AC
  Administered 2017-05-12: 3.375 g via INTRAVENOUS
  Filled 2017-05-12: qty 50

## 2017-05-12 MED ORDER — NITROGLYCERIN 0.4 MG SL SUBL: 0.4000 mg | SUBLINGUAL_TABLET | SUBLINGUAL | Status: DC | PRN

## 2017-05-12 MED ORDER — ENSURE ENLIVE PO LIQD
237.0000 mL | Freq: Two times a day (BID) | ORAL | Status: DC
  Administered 2017-05-13 – 2017-05-14 (×2): 237 mL via ORAL

## 2017-05-12 MED ORDER — SPIRONOLACTONE 25 MG PO TABS
12.5000 mg | ORAL_TABLET | Freq: Every day | ORAL | Status: DC
Start: 2017-05-13 — End: 2017-05-18
  Administered 2017-05-13 – 2017-05-17 (×5): 12.5 mg via ORAL
  Administered 2017-05-18: 10:00:00 via ORAL
  Filled 2017-05-12 (×6): qty 1

## 2017-05-12 MED ORDER — PAROXETINE HCL 20 MG PO TABS
10.0000 mg | ORAL_TABLET | Freq: Every day | ORAL | Status: DC
  Administered 2017-05-13 – 2017-05-18 (×6): 10 mg via ORAL
  Filled 2017-05-12 (×6): qty 1

## 2017-05-12 MED ORDER — FLUTICASONE PROPIONATE 50 MCG/ACT NA SUSP: 2.0000 | Freq: Three times a day (TID) | NASAL | Status: DC | PRN

## 2017-05-12 MED ORDER — SODIUM CHLORIDE 0.9 % IV SOLN
1.0000 g | Freq: Once | INTRAVENOUS | Status: AC
  Administered 2017-05-12: 1 g via INTRAVENOUS
  Filled 2017-05-12: qty 10

## 2017-05-12 MED ORDER — SODIUM CHLORIDE 0.9 % IV BOLUS (SEPSIS)
500.0000 mL | Freq: Once | INTRAVENOUS | Status: AC
  Administered 2017-05-12: 500 mL via INTRAVENOUS

## 2017-05-12 MED ORDER — ASPIRIN-CALCIUM CARBONATE 81-777 MG PO TABS: 81.0000 mg | ORAL_TABLET | Freq: Every day | ORAL | Status: DC

## 2017-05-12 MED ORDER — CALCIUM CARBONATE 1250 (500 CA) MG PO TABS
1.0000 | ORAL_TABLET | Freq: Every day | ORAL | Status: DC
  Administered 2017-05-13 – 2017-05-18 (×6): 500 mg via ORAL
  Filled 2017-05-12 (×6): qty 1

## 2017-05-12 MED ORDER — INSULIN GLARGINE 100 UNIT/ML ~~LOC~~ SOLN
8.0000 [IU] | Freq: Every day | SUBCUTANEOUS | Status: DC
  Administered 2017-05-13 – 2017-05-17 (×5): 8 [IU] via SUBCUTANEOUS
  Filled 2017-05-12 (×7): qty 0.08

## 2017-05-12 MED ORDER — VITAMIN B-12 1000 MCG PO TABS
1000.0000 ug | ORAL_TABLET | Freq: Every day | ORAL | Status: DC
  Administered 2017-05-13 – 2017-05-18 (×6): 1000 ug via ORAL
  Filled 2017-05-12 (×6): qty 1

## 2017-05-12 MED ORDER — VANCOMYCIN HCL IN DEXTROSE 1-5 GM/200ML-% IV SOLN
1000.0000 mg | Freq: Once | INTRAVENOUS | Status: AC
  Administered 2017-05-12: 1000 mg via INTRAVENOUS
  Filled 2017-05-12: qty 200

## 2017-05-12 MED ORDER — SODIUM CHLORIDE 0.9% FLUSH: 3.0000 mL | INTRAVENOUS | Status: DC | PRN

## 2017-05-12 MED ORDER — INSULIN GLARGINE 100 UNIT/ML SOLOSTAR PEN: 8.0000 [IU] | PEN_INJECTOR | Freq: Every day | SUBCUTANEOUS | Status: DC

## 2017-05-12 MED ORDER — VANCOMYCIN HCL 10 G IV SOLR
1250.0000 mg | Freq: Two times a day (BID) | INTRAVENOUS | Status: DC
  Administered 2017-05-12: 1250 mg via INTRAVENOUS
  Filled 2017-05-12 (×2): qty 1250

## 2017-05-12 MED ORDER — PRO-STAT SUGAR FREE PO LIQD
30.0000 mL | Freq: Two times a day (BID) | ORAL | Status: DC
  Administered 2017-05-12 – 2017-05-18 (×11): 30 mL via ORAL
  Filled 2017-05-12 (×11): qty 30

## 2017-05-12 MED ORDER — ACETAMINOPHEN 650 MG RE SUPP: 650.0000 mg | Freq: Four times a day (QID) | RECTAL | Status: DC | PRN

## 2017-05-12 MED ORDER — INSULIN ASPART 100 UNIT/ML ~~LOC~~ SOLN
0.0000 [IU] | Freq: Three times a day (TID) | SUBCUTANEOUS | Status: DC
  Administered 2017-05-13: 5 [IU] via SUBCUTANEOUS
  Administered 2017-05-14: 3 [IU] via SUBCUTANEOUS
  Administered 2017-05-15 (×2): 2 [IU] via SUBCUTANEOUS
  Administered 2017-05-16: 1 [IU] via SUBCUTANEOUS
  Administered 2017-05-16: 2 [IU] via SUBCUTANEOUS
  Administered 2017-05-18: 1 [IU] via SUBCUTANEOUS

## 2017-05-12 MED ORDER — RIFAXIMIN 550 MG PO TABS
550.0000 mg | ORAL_TABLET | Freq: Two times a day (BID) | ORAL | Status: DC
  Administered 2017-05-12 – 2017-05-18 (×12): 550 mg via ORAL
  Filled 2017-05-12 (×13): qty 1

## 2017-05-12 MED ORDER — VITAMIN B-12 1000 MCG PO TABS: 1000.0000 ug | ORAL_TABLET | Freq: Every day | ORAL | Status: DC

## 2017-05-12 NOTE — Progress Notes (Signed)
A consult was received from an ED physician for Vancomycin & Zosyn per pharmacy dosing for Sepsis.  The patient's profile has been reviewed for ht/wt/allergies/indication/available labs.   A one time order has been placed for Zosyn 3.375 gm and Vancomycin 1gm x1.  Further antibiotics/pharmacy consults should be ordered by admitting physician if indicated.                       Thank you,  Minda Ditto 05/12/2017  4:29 PM

## 2017-05-12 NOTE — ED Provider Notes (Signed)
[facility] Provider Note   CSN: [id number] Arrival date & time: 05/12/17  1425     History   Chief Complaint Chief Complaint  Patient presents with  . Blood Infection    HPI Adam Butler is a 61 y.o. male.  HPI   61 year old male with past medical history as below including chronic cirrhosis, coronary disease, CHF, diabetes, who presents with altered mental status.  The patient is encephalopathic and unable to provide history.  The patient's wife reports that she noticed he seems slightly more confused yesterday.  He has been taking his lactulose as prescribed and has been having bowel movements.  Earlier today, the patient was slightly confused but reportedly took his medicine.  The patient's wife was then called because the patient became increasingly confused and febrile throughout the day.  He is oriented to person only, which is very different from his baseline.  He has similar presentations for sepsis of unclear etiology in the past.  He denies any abdominal pain or chest pain or shortness of breath at this time.  Denies any vomiting.  He has had poor appetite.  Family does report that his urine has smelled malodorous for the last 24 hours.  Past Medical History:  Diagnosis Date  . Arthritis   . Cholelithiasis   . Cirrhosis ([place]) 2011   Cryptogenic, Likely NASH. Family/pt deny EtOH. HCV, HBV, HAV negative. ANA negative. AMA positive. Ascites 12/11  . Coronary artery disease    Inferior MI 12/11; LHC with occluded mid CFX and 80% proximal RCA. EF 55%. He had 3.0 x 28 vision BMS to CFX  . Diastolic CHF, acute ([place])    Echo 12/11 with ef 50-55% and mild LVH. EF 55% by LV0gram in 12/11  . Esophageal varices ([place]) 2011, 2013   no hx acute variceal bleed  . Hepatic encephalopathy ([place]) 2011, 12/2013  . Hyperlipemia   . Myocardial infarction [facility]) ? 2012  . PFO (patent foramen ovale): Per TEE 04/20/2015 04/20/2015  . Portal  hypertension ([place])   . Rectal varices   . S/P coronary artery stent placement 06/2010  . SVT (supraventricular tachycardia) ([place])    1/12: appeared to be an ectopic atrial tachycardia. Required DCCV with hemodynamic instability  . Type II diabetes mellitus ([employer]) 2011    Patient Active Problem List   Diagnosis Date Noted  . Fever 05/12/2017  . Hematuria   . Chronic diastolic CHF (congestive heart failure) ([place])   . Thoracic ascending aortic aneurysm ([place])   . Diabetes mellitus type 2, uncontrolled, with complications ([place])   . Alcoholic cirrhosis of liver without ascites ([place])   . NASH (nonalcoholic steatohepatitis)   . Somnolence   . Thrombocytopenia ([place])   . Sepsis ([place]) 04/27/2017  . Hematochezia 03/21/2017  . OA (osteoarthritis) of knee 07/19/2016  . Severe obesity (BMI >= 40) ([place]) 07/19/2016  . Portal vein thrombosis   . GERD (gastroesophageal reflux disease) 09/29/2015  . Type 2 diabetes mellitus ([place]) 08/18/2015  . Anemia 08/18/2015  . CAD in native artery   . Hypokalemia   . Liver cirrhosis secondary to NASH ([place]) 06/06/2015  . Tobacco abuse 05/04/2015  . PFO (patent foramen ovale): Per TEE 04/20/2015 04/20/2015  . Esophageal varices without bleeding ([place]) 08/16/2010  . HLD (hyperlipidemia) 08/02/2010  . DIASTOLIC HEART FAILURE, CHRONIC 08/02/2010    Past Surgical History:  Procedure Laterality Date  . APPENDECTOMY    . CARDIAC CATHETERIZATION  07/01/2010   BMS  to CFX.  Marland Kitchen CATARACT EXTRACTION W/PHACO Left 04/28/2016   Procedure: CATARACT EXTRACTION PHACO AND INTRAOCULAR LENS PLACEMENT (IOC);  Surgeon: Leandrew Koyanagi, MD;  Location: [facility] ORS;  Service: Ophthalmology;  Laterality: Left;  Lot# [id number] H [place]: 05:31.7 AP%: 28.2 CDE: 93.48   . COLONOSCOPY  04/13/2012   Procedure: COLONOSCOPY;  Surgeon: Inda Castle, MD;  Location: [facility] ENDOSCOPY;  Service: Endoscopy;  Laterality: N/A;  . COLONOSCOPY WITH PROPOFOL N/A 03/22/2017   Procedure: COLONOSCOPY WITH  PROPOFOL;  Surgeon: Doran Stabler, MD;  Location: [facility] ENDOSCOPY;  Service: Gastroenterology;  Laterality: N/A;  . CORONARY ANGIOPLASTY    . CORONARY STENT PLACEMENT  06/30/2010   CFX   Distal        . ESOPHAGEAL BANDING N/A 01/01/2014   Procedure: ESOPHAGEAL BANDING;  Surgeon: Jerene Bears, MD;  Location: [facility] ENDOSCOPY;  Service: Endoscopy;  Laterality: N/A;  . ESOPHAGOGASTRODUODENOSCOPY  04/13/2012   Procedure: ESOPHAGOGASTRODUODENOSCOPY (EGD);  Surgeon: Inda Castle, MD;  Location: [facility];  Service: Endoscopy;  Laterality: N/A;  . ESOPHAGOGASTRODUODENOSCOPY N/A 01/01/2014   Procedure: ESOPHAGOGASTRODUODENOSCOPY (EGD);  Surgeon: Jerene Bears, MD;  Location[facility] ENDOSCOPY;  Service: Endoscopy;  Laterality: N/A;  . ESOPHAGOGASTRODUODENOSCOPY (EGD) WITH PROPOFOL N/A 07/12/2016   Procedure: ESOPHAGOGASTRODUODENOSCOPY (EGD) WITH PROPOFOL;  Surgeon: Jerene Bears, MD;  Location: [facility] ENDOSCOPY;  Service: Gastroenterology;  Laterality: N/A;  . LEFT HEART CATHETERIZATION WITH CORONARY ANGIOGRAM N/A 10/15/2014   Procedure: LEFT HEART CATHETERIZATION WITH CORONARY ANGIOGRAM;  Surgeon: Peter M Martinique, MD;  Location: [facility] CATH LAB;  Service: Cardiovascular;  Laterality: N/A;  . orif r leg    . TEE WITHOUT CARDIOVERSION N/A 04/20/2015   Procedure: TRANSESOPHAGEAL ECHOCARDIOGRAM (TEE);  Surgeon: Fay Records, MD;  Location: [facility] ENDOSCOPY;  Service: Cardiovascular;  Laterality: N/A;       Home Medications    Prior to Admission medications   Medication Sig Start Date End Date Taking? Authorizing Provider  Aspirin-Calcium Carbonate (BAYER WOMENS) [phone] MG TABS Take 81 mg by mouth daily.   Yes [provider]  atorvastatin (LIPITOR) 10 MG tablet Take 10 mg by mouth daily.   Yes [provider]  cyanocobalamin 1000 MCG tablet Take 1,000 mcg by mouth daily. Vitamin B12   Yes [provider]  glipiZIDE (GLUCOTROL) 10 MG tablet Take 10 mg by mouth 2 (two) times daily before a meal.    Yes [provider]  Insulin Glargine (LANTUS) 100 UNIT/ML Solostar Pen Inject 8 Units into the skin daily at 10 pm. 04/30/17  Yes Allie Bossier, MD  insulin regular (NOVOLIN R,HUMULIN R) 100 units/mL injection Inject 25-80 Units into the skin 3 (three) times daily as needed for high blood sugar (CBG >150).   Yes [provider]  lactulose (CHRONULAC) 10 GM/15ML solution Take 30 mLs (20 g total) by mouth at bedtime. 04/30/17  Yes Allie Bossier, MD  Multiple Vitamin (MULTIVITAMIN WITH MINERALS) TABS tablet Take 1 tablet by mouth daily.   Yes [provider]  PARoxetine (PAXIL) 10 MG tablet Take 1 tablet (10 mg total) by mouth daily. 03/30/17  Yes [name], NP  ranitidine (ZANTAC) 150 MG tablet Take 150 mg by mouth See admin instructions. Take 1 tablet (150 mg) by mouth every morning, may also take 1 tablet at night as needed for acid reflux   Yes [provider]  spironolactone (ALDACTONE) 25 MG tablet Take 0.5 tablets (12.5 mg total) by mouth daily. 05/08/17  Yes Larae Grooms  S, MD  fluticasone (FLONASE) 50 MCG/ACT nasal spray Place 2 sprays into both nostrils 3 (three) times daily as needed for allergies or rhinitis (congestion).    [provider]  Insulin Pen Needle (PEN NEEDLES) 31G X 8 MM MISC 8 Units by Does not apply route at bedtime. 04/30/17   Allie Bossier, MD  nitroGLYCERIN (NITROSTAT) 0.4 MG SL tablet Place 1 tablet (0.4 mg total) under the tongue every 5 (five) minutes as needed for chest pain (chest pain). 07/19/16   [name], NP    Family History Family History  Problem Relation Age of Onset  . Heart attack Brother 59       MI  . Diabetes Brother   . Heart attack Father   . Diabetes Father   . COPD Father   . COPD Mother   . Heart attack Brother   . Stroke Neg Hx     Social History Social History  Substance Use Topics  . Smoking status: Current Every Day Smoker    Packs/day: 0.25    Years: 36.00     Types: E-cigarettes, Cigarettes  . Smokeless tobacco: Never Used     Comment: uses vapor cigarettes (2016 ); I smoke about 4 cigarettes a day  . Alcohol use No     Allergies   Isosorbide nitrate   Review of Systems Review of Systems  Constitutional: Positive for fatigue and fever.  Neurological: Positive for weakness.  Psychiatric/Behavioral: Positive for confusion.  All other systems reviewed and are negative.    Physical Exam Updated Vital Signs BP 109/60   Pulse 66   Temp 98.4 F (36.9 C) (Oral)   Resp (!) 22   Ht 5' 7"  (1.702 m)   Wt 130.4 kg (287 lb 7.7 oz)   SpO2 99%   BMI 45.03 kg/m   Physical Exam  Constitutional: He appears well-developed and well-nourished. No distress.  HENT:  Head: Normocephalic and atraumatic.  Eyes: Conjunctivae are normal.  Neck: Neck supple.  Cardiovascular: Normal rate, regular rhythm and normal heart sounds.  Exam reveals no friction rub.   No murmur heard. Pulmonary/Chest: Effort normal and breath sounds normal. No respiratory distress. He has no wheezes. He has no rales.  Abdominal: He exhibits no distension.  Soft, obese, without peritonitis.  Musculoskeletal: He exhibits no edema.  Neurological: He is alert. He exhibits normal muscle tone.  Oriented to person only.  Mild asterixis bilaterally.  Face symmetric.  Moves all extremity's with 5 out of 5 strength.  Normal sensation light touch.  Gait deferred.  Skin: Skin is warm. Capillary refill takes less than 2 seconds.  Psychiatric: He has a normal mood and affect.  Nursing note and vitals reviewed.    ED Treatments / Results  Labs (all labs ordered are listed, but only abnormal results are displayed) Labs Reviewed  COMPREHENSIVE METABOLIC PANEL - Abnormal; Notable for the following:       Result Value   Sodium 134 (*)    Potassium 2.8 (*)    Glucose, Bld 180 (*)    Calcium 7.1 (*)    Total Protein 6.0 (*)    Albumin 2.1 (*)    AST 66 (*)    Total Bilirubin 2.4  (*)    All other components within normal limits  CBC WITH DIFFERENTIAL/PLATELET - Abnormal; Notable for the following:    RBC 3.05 (*)    Hemoglobin 10.5 (*)    HCT 30.0 (*)    MCH 34.4 (*)  Platelets 46 (*)    Monocytes Absolute 1.1 (*)    All other components within normal limits  PROTIME-INR - Abnormal; Notable for the following:    Prothrombin Time 20.4 (*)    All other components within normal limits  URINALYSIS, ROUTINE W REFLEX MICROSCOPIC - Abnormal; Notable for the following:    Color, Urine AMBER (*)    Hgb urine dipstick MODERATE (*)    All other components within normal limits  AMMONIA - Abnormal; Notable for the following:    Ammonia 80 (*)    All other components within normal limits  MAGNESIUM - Abnormal; Notable for the following:    Magnesium 1.3 (*)    All other components within normal limits  I-STAT CG4 LACTIC ACID, ED - Abnormal; Notable for the following:    Lactic Acid, Venous 2.01 (*)    All other components within normal limits  CBG MONITORING, ED - Abnormal; Notable for the following:    Glucose-Capillary 172 (*)    All other components within normal limits  CULTURE, BLOOD (ROUTINE X 2)  CULTURE, BLOOD (ROUTINE X 2)  RESPIRATORY PANEL BY PCR  GASTROINTESTINAL PANEL BY PCR, STOOL (REPLACES STOOL CULTURE)  AMMONIA  CBC  COMPREHENSIVE METABOLIC PANEL  I-STAT CG4 LACTIC ACID, ED    EKG  EKG Interpretation None       Radiology Dg Chest 2 View  Result Date: 05/12/2017 CLINICAL DATA:  Fever and status changes. Coronary artery disease. CHF. Diabetic. Smoker. EXAM: CHEST  2 VIEW COMPARISON:  04/27/2017 FINDINGS: Both views are degraded by patient body habitus and AP portable frontal technique. Midline trachea. Mild cardiomegaly. Atherosclerosis in the transverse aorta. Suspect tiny bilateral pleural effusions. No pneumothorax. Mild pulmonary venous congestion, accentuated by AP portable frontal technique. No well-defined lobar consolidation.  IMPRESSION: Cardiomegaly with mild pulmonary venous congestion and probable small bilateral pleural effusions. Aortic Atherosclerosis (ICD10-I70.0). Decreased sensitivity and specificity exam due to technique related factors, as described above. Electronically Signed   By: Abigail Miyamoto M.D.   On: 05/12/2017 15:46    Procedures Procedures (including critical care time)  Medications Ordered in ED Medications  potassium chloride 10 mEq in 100 mL IVPB (0 mEq Intravenous Stopped 05/12/17 1954)  rifaximin (XIFAXAN) tablet 550 mg (550 mg Oral Given 05/12/17 2336)  feeding supplement (PRO-STAT SUGAR FREE 64) liquid 30 mL (30 mLs Oral Given 05/12/17 2341)  atorvastatin (LIPITOR) tablet 10 mg (10 mg Oral Not Given 05/12/17 2342)  spironolactone (ALDACTONE) tablet 12.5 mg (not administered)  lactulose (CHRONULAC) 10 GM/15ML solution 20 g (20 g Oral Not Given 05/12/17 2335)  PARoxetine (PAXIL) tablet 10 mg (not administered)  fluticasone (FLONASE) 50 MCG/ACT nasal spray 2 spray (not administered)  multivitamin with minerals tablet 1 tablet (1 tablet Oral Not Given 05/12/17 2342)  nitroGLYCERIN (NITROSTAT) SL tablet 0.4 mg (not administered)  famotidine (PEPCID) tablet 10 mg (10 mg Oral Given 05/12/17 2336)  insulin aspart (novoLOG) injection 0-9 Units (not administered)  insulin aspart (novoLOG) injection 0-5 Units (0 Units Subcutaneous Not Given 05/12/17 2346)  sodium chloride flush (NS) 0.9 % injection 3 mL (not administered)  sodium chloride flush (NS) 0.9 % injection 3 mL (not administered)  0.9 %  sodium chloride infusion (not administered)  acetaminophen (TYLENOL) tablet 650 mg (not administered)    Or  acetaminophen (TYLENOL) suppository 650 mg (not administered)  insulin glargine (LANTUS) injection 8 Units (8 Units Subcutaneous Not Given 05/12/17 2338)  vitamin B-12 (CYANOCOBALAMIN) tablet 1,000 mcg (not administered)  aspirin  EC tablet 81 mg (not administered)    And  calcium carbonate (OS-CAL -  dosed in mg of elemental calcium) tablet 500 mg of elemental calcium (not administered)  vancomycin (VANCOCIN) 1,250 mg in sodium chloride 0.9 % 250 mL IVPB (1,250 mg Intravenous New Bag/Given 05/12/17 2337)  feeding supplement (ENSURE ENLIVE) (ENSURE ENLIVE) liquid 237 mL (not administered)  piperacillin-tazobactam (ZOSYN) IVPB 3.375 g (0 g Intravenous Stopped 05/12/17 1623)  vancomycin (VANCOCIN) IVPB 1000 mg/200 mL premix (0 mg Intravenous Stopped 05/12/17 1738)  sodium chloride 0.9 % bolus 500 mL (0 mLs Intravenous Stopped 05/12/17 1623)  potassium chloride SA (K-DUR,KLOR-CON) CR tablet 40 mEq (40 mEq Oral Given 05/12/17 1654)  calcium gluconate 1 g in sodium chloride 0.9 % 100 mL IVPB (0 g Intravenous Stopped 05/12/17 1737)  lactulose (CHRONULAC) 10 GM/15ML solution 30 g (30 g Oral Given 05/12/17 1740)     Initial Impression / Assessment and Plan / ED Course  I have reviewed the triage vital signs and the nursing notes.  Pertinent labs & imaging results that were available during my care of the patient were reviewed by me and considered in my medical decision making (see chart for details).  Clinical Course as of May 13 213  Fri May 12, 2017  1608 Lymphocytes: PENDING [CI]    Clinical Course User Index [CI] Duffy Bruce, MD    61 yo M here with acute encephalopathy, likely multifactorial in setting of sepsis of unclear source and hyperammonemia. Pt given broad-spectrum ABX, IVF but will be cautious in setting of pulmonary edema on CXR. He has mild LA elevation but no signs of shock - will monitor fluids closely. Otherwise, he has abd distension but no clear pockets of ascites for para - may benefit from IR-guided para if sx persist. Pt given ABX, lactulose, and will admit.  Final Clinical Impressions(s) / ED Diagnoses   Final diagnoses:  Sepsis, due to unspecified organism ([place])  Hyperammonemia [facility])    New Prescriptions Current Discharge Medication List       Duffy Bruce, MD 05/13/17 0214

## 2017-05-12 NOTE — Progress Notes (Signed)
Pharmacy Antibiotic Note  Adam Butler is a 61 y.o. male admitted on 05/12/2017 with sepsis.  Pharmacy has been consulted for Vancomycin & Zosyn dosing.  Plan: Vancomycin 1gm in ED, followed by 1250m  IV every 12 hours.  Goal trough 15-20 mcg/mL. Zosyn 3.375g IV q8h (4 hour infusion).  Height: 5' 7"  (170.2 cm) Weight: 287 lb 7.7 oz (130.4 kg) IBW/kg (Calculated) : 66.1  Temp (24hrs), Avg:99.5 F (37.5 C), Min:98.4 F (36.9 C), Max:100.9 F (38.3 C)   Recent Labs Lab 05/12/17 1502 05/12/17 1508 05/12/17 1705  WBC 9.6  --   --   CREATININE 0.81  --   --   LATICACIDVEN  --  2.01* 1.34    Estimated Creatinine Clearance: 124.4 mL/min (by C-G formula based on SCr of 0.81 mg/dL).    Allergies  Allergen Reactions  . Isosorbide Nitrate Other (See Comments)    Nose bleeds   Antimicrobials this admission: 11/2 Zosyn >>  11/2 Vanc  >>   Dose adjustments this admission:  Microbiology results: 11/2 BCx: sent         Resp panel PCR: ordered          GI panel:  ordered   Thank you for allowing pharmacy to be a part of this patient's care.  GMinda Ditto11/08/2016 9:14 PM

## 2017-05-12 NOTE — ED Notes (Signed)
In Ct

## 2017-05-12 NOTE — ED Notes (Signed)
Date and time results received: 05/12/17  1510  Test: Lactic acid Critical Value: 2.01

## 2017-05-12 NOTE — ED Notes (Addendum)
Report given to [place], Therapist, sports.

## 2017-05-12 NOTE — ED Triage Notes (Signed)
Pt. Arrive [facility] from home. Mental status changing started today. Alert to verbal. Asking the same question over again. Temp.- 102.2, RR-24, CBG-242, Hx. DM, HR-90, BP-144/68, 97/74. C/O abdominal pain. Denies nausea. Large brusing to abdomen. Hx. Non alcoholic idrosis. Family says this is kind of like his symptoms when ammonia level is up except these symptoms are progressed. 18g Left forearm. 1000 ns, 1000 tylenol. Per EMS. 2L of oxygen at 98%. [phone]

## 2017-05-12 NOTE — H&P (Addendum)
TRH H&P   Patient Demographics:    Adam Butler, is a 61 y.o. male  MRN: [id number]   DOB - 03/19/1956  Admit Date - 05/12/2017  Outpatient Primary MD for the patient is Garnette Gunner, Coralie Keens, NP  Referring MD/NP/PA: Lindell Noe  Outpatient Specialists:     Patient coming from: home  Chief Complaint  Patient presents with  . Blood Infection      HPI:    Adam Butler  is a 61 y.o. male, w  dm2, hypertension, hyperlpidemia, CAD, CHF cirrhosis (nash),w esophageal varices, hepatic encephalopathy c/o sleeping spells and couldn't stay awake and everyone in his house panicks.  Slight dry cough. + diarrhea but on lactulose.    In ED, pt was noted to be febrile, source unclear.   Wbc wbc 9.6, Hgb 10.5, Plt 46 Na 134, K 2.8, Glucose 180, Bun 7, Creatinine 0.81 Albumin 2.1 Ast 66, Alt 39, alk phos 125, T. Bili 2.4 Ammonia 80  ua Rbc tntc  CXR IMPRESSION: Cardiomegaly with mild pulmonary venous congestion and probable small bilateral pleural effusions.  Pt will be admitted for fever of unclear source     Review of systems:    In addition to the HPI above,    No Headache, No changes with Vision or hearing, No problems swallowing food or Liquids, No Chest pain, Cough or Shortness of Breath, No Abdominal pain, No Nausea or Vommitting, Bowel movements are regular, No Blood in stool or Urine, No dysuria, No new skin rashes or bruises, No new joints pains-aches,  No new weakness, tingling, numbness in any extremity, No recent weight gain or loss, No polyuria, polydypsia or polyphagia, No significant Mental Stressors.  A full 10 point Review of Systems was done, except as stated above, all other Review of Systems were negative.   With Past History of the following :    Past Medical History:  Diagnosis Date  . Arthritis   . Cholelithiasis   . Cirrhosis ([place])  2011   Cryptogenic, Likely NASH. Family/pt deny EtOH. HCV, HBV, HAV negative. ANA negative. AMA positive. Ascites 12/11  . Coronary artery disease    Inferior MI 12/11; LHC with occluded mid CFX and 80% proximal RCA. EF 55%. He had 3.0 x 28 vision BMS to CFX  . Diastolic CHF, acute ([place])    Echo 12/11 with ef 50-55% and mild LVH. EF 55% by LV0gram in 12/11  . Esophageal varices ([place]) 2011, 2013   no hx acute variceal bleed  . Hepatic encephalopathy ([place]) 2011, 12/2013  . Hyperlipemia   . Myocardial infarction [facility]) ? 2012  . PFO (patent foramen ovale): Per TEE 04/20/2015 04/20/2015  . Portal hypertension ([place])   . Rectal varices   . S/P coronary artery stent placement 06/2010  . SVT (supraventricular tachycardia) ([place])    1/12: appeared to be an ectopic atrial tachycardia. Required DCCV with  hemodynamic instability  . Type II diabetes mellitus ([place]) 2011      Past Surgical History:  Procedure Laterality Date  . APPENDECTOMY    . CARDIAC CATHETERIZATION  07/01/2010   BMS to CFX.  Marland Kitchen CATARACT EXTRACTION W/PHACO Left 04/28/2016   Procedure: CATARACT EXTRACTION PHACO AND INTRAOCULAR LENS PLACEMENT (IOC);  Surgeon: Leandrew Koyanagi, MD;  Location: [facility] ORS;  Service: Ophthalmology;  Laterality: Left;  Lot# [id number] H [place]: 05:31.7 AP%: 28.2 CDE: 93.48   . COLONOSCOPY  04/13/2012   Procedure: COLONOSCOPY;  Surgeon: Inda Castle, MD;  Location: [facility] ENDOSCOPY;  Service: Endoscopy;  Laterality: N/A;  . COLONOSCOPY WITH PROPOFOL N/A 03/22/2017   Procedure: COLONOSCOPY WITH PROPOFOL;  Surgeon: Doran Stabler, MD;  Location: [facility] ENDOSCOPY;  Service: Gastroenterology;  Laterality: N/A;  . CORONARY ANGIOPLASTY    . CORONARY STENT PLACEMENT  06/30/2010   CFX   Distal        . ESOPHAGEAL BANDING N/A 01/01/2014   Procedure: ESOPHAGEAL BANDING;  Surgeon: Jerene Bears, MD;  Location: [place] ENDOSCOPY;  Service: Endoscopy;  Laterality: N/A;  . ESOPHAGOGASTRODUODENOSCOPY  04/13/2012   Procedure:  ESOPHAGOGASTRODUODENOSCOPY (EGD);  Surgeon: Inda Castle, MD;  Location: [facility];  Service: Endoscopy;  Laterality: N/A;  . ESOPHAGOGASTRODUODENOSCOPY N/A 01/01/2014   Procedure: ESOPHAGOGASTRODUODENOSCOPY (EGD);  Surgeon: Jerene Bears, MD;  Location: [facility] ENDOSCOPY;  Service: Endoscopy;  Laterality: N/A;  . ESOPHAGOGASTRODUODENOSCOPY (EGD) WITH PROPOFOL N/A 07/12/2016   Procedure: ESOPHAGOGASTRODUODENOSCOPY (EGD) WITH PROPOFOL;  Surgeon: Jerene Bears, MD;  Location: [facility] ENDOSCOPY;  Service: Gastroenterology;  Laterality: N/A;  . LEFT HEART CATHETERIZATION WITH CORONARY ANGIOGRAM N/A 10/15/2014   Procedure: LEFT HEART CATHETERIZATION WITH CORONARY ANGIOGRAM;  Surgeon: Peter M Martinique, MD;  Location: [facility] CATH LAB;  Service: Cardiovascular;  Laterality: N/A;  . orif r leg    . TEE WITHOUT CARDIOVERSION N/A 04/20/2015   Procedure: TRANSESOPHAGEAL ECHOCARDIOGRAM (TEE);  Surgeon: Fay Records, MD;  Location: [facility] ENDOSCOPY;  Service: Cardiovascular;  Laterality: N/A;      Social History:     Social History  Substance Use Topics  . Smoking status: Current Every Day Smoker    Packs/day: 0.25    Years: 36.00    Types: E-cigarettes, Cigarettes  . Smokeless tobacco: Never Used     Comment: uses vapor cigarettes (2016 ); I smoke about 4 cigarettes a day  . Alcohol use No     Lives - at home  Mobility - walks by self   Family History :     Family History  Problem Relation Age of Onset  . Heart attack Brother 7       MI  . Diabetes Brother   . Heart attack Father   . Diabetes Father   . COPD Father   . COPD Mother   . Heart attack Brother   . Stroke Neg Hx       Home Medications:   Prior to Admission medications   Medication Sig Start Date End Date Taking? Authorizing Provider  Aspirin-Calcium Carbonate (BAYER WOMENS) [phone] MG TABS Take 81 mg by mouth daily.   Yes [provider]  atorvastatin (LIPITOR) 10 MG tablet Take 10 mg by mouth daily.   Yes [provider]  cyanocobalamin 1000 MCG tablet Take 1,000 mcg by mouth daily. Vitamin B12   Yes [provider]  glipiZIDE (GLUCOTROL) 10 MG tablet Take 10 mg by mouth 2 (two) times daily before a meal.   Yes [provider]  Insulin Glargine (LANTUS) 100 UNIT/ML Solostar Pen Inject 8 Units into the skin daily at 10 pm. 04/30/17  Yes Woods, Curtis J, MD  insulin regular (NOVOLIN R,HUMULIN R) 100 units/mL injection Inject 25-80 Units into the skin 3 (three) times daily as needed for high blood sugar (CBG >150).   Yes [provider]  lactulose (CHRONULAC) 10 GM/15ML solution Take 30 mLs (20 g total) by mouth at bedtime. 04/30/17  Yes Woods, Curtis J, MD  Multiple Vitamin (MULTIVITAMIN WITH MINERALS) TABS tablet Take 1 tablet by mouth daily.   Yes [provider]  PARoxetine (PAXIL) 10 MG tablet Take 1 tablet (10 mg total) by mouth daily. 03/30/17  Yes Baity, [name] W, NP  ranitidine (ZANTAC) 150 MG tablet Take 150 mg by mouth See admin instructions. Take 1 tablet (150 mg) by mouth every morning, may also take 1 tablet at night as needed for acid reflux   Yes [provider]  spironolactone (ALDACTONE) 25 MG tablet Take 0.5 tablets (12.5 mg total) by mouth daily. 05/08/17  Yes Varanasi, Jayadeep S, MD  fluticasone (FLONASE) 50 MCG/ACT nasal spray Place 2 sprays into both nostrils 3 (three) times daily as needed for allergies or rhinitis (congestion).    [provider]  Insulin Pen Needle (PEN NEEDLES) 31G X 8 MM MISC 8 Units by Does not apply route at bedtime. 04/30/17   Woods, Curtis J, MD  nitroGLYCERIN (NITROSTAT) 0.4 MG SL tablet Place 1 tablet (0.4 mg total) under the tongue every 5 (five) minutes as needed for chest pain (chest pain). 07/19/16   Baity, [name] W, NP     Allergies:     Allergies  Allergen Reactions  . Isosorbide Nitrate Other (See Comments)    Nose bleeds     Physical Exam:   Vitals  Blood pressure 105/60, pulse 70, temperature  (!) 100.9 F (38.3 C), temperature source Rectal, resp. rate (!) 23, SpO2 96 %.   1. General  lying in bed in NAD,    2. Normal affect and insight, Not Suicidal or Homicidal, Awake Alert, Oriented X 3.  3. No F.N deficits, ALL C.Nerves Intact, Strength 5/5 all 4 extremities, Sensation intact all 4 extremities, Plantars down going.  4. Ears and Eyes appear Normal, Conjunctivae clear, PERRLA. Moist Oral Mucosa.  5. Supple Neck, No JVD, No cervical lymphadenopathy appriciated, No Carotid Bruits.  6. Symmetrical Chest wall movement, Good air movement bilaterally, CTAB.  7. RRR, No Gallops, Rubs or Murmurs, No Parasternal Heave.  8. Positive Bowel Sounds, Abdomen Soft, No tenderness, No organomegaly appriciated,No rebound -guarding or rigidity.  9.  No Cyanosis, Normal Skin Turgor, No Skin Rash or Bruise.  10. Good muscle tone,  joints appear normal , no effusions, Normal ROM.  11. No Palpable Lymph Nodes in Neck or Axillae  No palmar erythema, no asterixis,  + spider    Data Review:    CBC  Recent Labs Lab 05/12/17 1502  WBC 9.6  HGB 10.5*  HCT 30.0*  PLT 46*  MCV 98.4  MCH 34.4*  MCHC 35.0  RDW 14.9  LYMPHSABS 0.8  MONOABS 1.1*  EOSABS 0.0  BASOSABS 0.0   ------------------------------------------------------------------------------------------------------------------  Chemistries   Recent Labs Lab 05/12/17 1502  NA 134*  K 2.8*  CL 104  CO2 24  GLUCOSE 180*  BUN 7  CREATININE 0.81  CALCIUM 7.1*  AST 66*  ALT 39  ALKPHOS 125  BILITOT 2.4*   ------------------------------------------------------------------------------------------------------------------ estimated creatinine clearance is 123.8   mL/min (by C-G formula based on SCr of 0.81 mg/dL). ------------------------------------------------------------------------------------------------------------------ No results for input(s): TSH, T4TOTAL, T3FREE, THYROIDAB in the last 72 hours.  Invalid  input(s): FREET3  Coagulation profile  Recent Labs Lab 05/12/17 1502  INR 1.76   ------------------------------------------------------------------------------------------------------------------- No results for input(s): DDIMER in the last 72 hours. -------------------------------------------------------------------------------------------------------------------  Cardiac Enzymes No results for input(s): CKMB, TROPONINI, MYOGLOBIN in the last 168 hours.  Invalid input(s): CK ------------------------------------------------------------------------------------------------------------------    Component Value Date/Time   BNP 54.0 03/02/2017 1009   BNP 27.7 01/10/2011 0845     ---------------------------------------------------------------------------------------------------------------  Urinalysis    Component Value Date/Time   COLORURINE AMBER (A) 05/12/2017 1735   APPEARANCEUR CLEAR 05/12/2017 1735   LABSPEC 1.018 05/12/2017 1735   PHURINE 6.0 05/12/2017 1735   GLUCOSEU NEGATIVE 05/12/2017 1735   HGBUR MODERATE (A) 05/12/2017 1735   BILIRUBINUR NEGATIVE 05/12/2017 1735   KETONESUR NEGATIVE 05/12/2017 1735   PROTEINUR NEGATIVE 05/12/2017 1735   UROBILINOGEN 0.2 05/14/2015 1813   NITRITE NEGATIVE 05/12/2017 1735   LEUKOCYTESUR NEGATIVE 05/12/2017 1735    ----------------------------------------------------------------------------------------------------------------   Imaging Results:    Dg Chest 2 View  Result Date: 05/12/2017 CLINICAL DATA:  Fever and status changes. Coronary artery disease. CHF. Diabetic. Smoker. EXAM: CHEST  2 VIEW COMPARISON:  04/27/2017 FINDINGS: Both views are degraded by patient body habitus and AP portable frontal technique. Midline trachea. Mild cardiomegaly. Atherosclerosis in the transverse aorta. Suspect tiny bilateral pleural effusions. No pneumothorax. Mild pulmonary venous congestion, accentuated by AP portable frontal technique. No  well-defined lobar consolidation. IMPRESSION: Cardiomegaly with mild pulmonary venous congestion and probable small bilateral pleural effusions. Aortic Atherosclerosis (ICD10-I70.0). Decreased sensitivity and specificity exam due to technique related factors, as described above. Electronically Signed   By: Abigail Miyamoto M.D.   On: 05/12/2017 15:46      Assessment & Plan:    Principal Problem:   Fever Active Problems:   Liver cirrhosis secondary to NASH (HCC)   Hypokalemia   Anemia   Diabetes mellitus type 2, uncontrolled, with complications (HCC)   Thrombocytopenia (HCC)    Fever unclear source Blood culture x2 resp viral culture Vanco, zosyn iv pharmacy to dose  Hypokalemia Replete Check cmp in am  Anemia/Thrombocytopenia Avoid heparin Check cbc in am  Dm2 fsbs ac and qhs, ISS  Cirrhosis ([place]) with esophageal varicose veins,  Cont spironolactone Cont lactulose Cont zantac Add rifaxamin Check ammonia in am  CAD Cont lipitor Cont aspirin  H/o portal vein thrombosis in the past Pt not currently on anticoagulation  ? Due to esophageal varices  Microscopic hematuria CT scan abd/ pelvis   DVT Prophylaxis SCDs   AM Labs Ordered, also please review Full Orders  Family Communication: Admission, patients condition and plan of care including tests being ordered have been discussed with the patient  who indicate understanding and agree with the plan and Code Status.  Code Status FULL CODE  Likely DC to  home  Condition GUARDED    Consults called: none  Admission status: inpatient   Time spent in minutes : 45   Jani Gravel M.D on 05/12/2017 at 7:37 PM  Between 7am to 7pm - Pager - [phone]  . After 7pm go to [email or web address] - password [facility]  [facility] - Office  [phone]

## 2017-05-12 NOTE — ED Notes (Signed)
RN may call report to [place] [phone] at 19:55

## 2017-05-12 NOTE — ED Notes (Signed)
Bed: [facility] Expected date:  Expected time:  Means of arrival:  Comments: 61 yo m sepsis?

## 2017-05-13 ENCOUNTER — Inpatient Hospital Stay (HOSPITAL_COMMUNITY): Payer: PPO

## 2017-05-13 ENCOUNTER — Encounter (HOSPITAL_COMMUNITY): Payer: Self-pay | Admitting: Radiology

## 2017-05-13 LAB — COMPREHENSIVE METABOLIC PANEL
ALBUMIN: 1.9 g/dL — AB (ref 3.5–5.0)
ALK PHOS: 94 U/L (ref 38–126)
ALT: 34 U/L (ref 17–63)
AST: 63 U/L — ABNORMAL HIGH (ref 15–41)
Anion gap: 6 (ref 5–15)
BILIRUBIN TOTAL: 3.4 mg/dL — AB (ref 0.3–1.2)
BUN: 8 mg/dL (ref 6–20)
CALCIUM: 7.2 mg/dL — AB (ref 8.9–10.3)
CO2: 25 mmol/L (ref 22–32)
CREATININE: 0.65 mg/dL (ref 0.61–1.24)
Chloride: 107 mmol/L (ref 101–111)
GFR calc Af Amer: >60 mL/min (ref >60)
GFR calc non Af Amer: >60 mL/min (ref >60)
GLUCOSE: 107 mg/dL — AB (ref 65–99)
Potassium: 3.6 mmol/L (ref 3.5–5.1)
SODIUM: 138 mmol/L (ref 135–145)
TOTAL PROTEIN: 5.6 g/dL — AB (ref 6.5–8.1)

## 2017-05-13 LAB — BLOOD CULTURE ID PANEL (REFLEXED)
Acinetobacter baumannii: NOT DETECTED
Candida albicans: NOT DETECTED
Candida glabrata: NOT DETECTED
Candida krusei: NOT DETECTED
Candida parapsilosis: NOT DETECTED
Candida tropicalis: NOT DETECTED
ENTEROCOCCUS SPECIES: NOT DETECTED
Enterobacter cloacae complex: NOT DETECTED
Enterobacteriaceae species: NOT DETECTED
Escherichia coli: NOT DETECTED
HAEMOPHILUS INFLUENZAE: NOT DETECTED
Klebsiella oxytoca: NOT DETECTED
Klebsiella pneumoniae: NOT DETECTED
LISTERIA MONOCYTOGENES: NOT DETECTED
Neisseria meningitidis: NOT DETECTED
PROTEUS SPECIES: NOT DETECTED
PSEUDOMONAS AERUGINOSA: NOT DETECTED
SERRATIA MARCESCENS: NOT DETECTED
STAPHYLOCOCCUS AUREUS BCID: NOT DETECTED
STAPHYLOCOCCUS SPECIES: NOT DETECTED
STREPTOCOCCUS PNEUMONIAE: NOT DETECTED
STREPTOCOCCUS PYOGENES: NOT DETECTED
STREPTOCOCCUS SPECIES: DETECTED — AB
Streptococcus agalactiae: NOT DETECTED

## 2017-05-13 LAB — GLUCOSE, CAPILLARY
Glucose-Capillary: 120 mg/dL — ABNORMAL HIGH (ref 65–99)
Glucose-Capillary: 125 mg/dL — ABNORMAL HIGH (ref 65–99)
Glucose-Capillary: 277 mg/dL — ABNORMAL HIGH (ref 65–99)
Glucose-Capillary: 178 mg/dL — ABNORMAL HIGH (ref 65–99)

## 2017-05-13 LAB — CBC
HEMATOCRIT: 29.6 % — AB (ref 39.0–52.0)
HEMOGLOBIN: 10.1 g/dL — AB (ref 13.0–17.0)
MCH: 34.1 pg — AB (ref 26.0–34.0)
MCHC: 34.1 g/dL (ref 30.0–36.0)
MCV: 100 fL (ref 78.0–100.0)
Platelets: 30 10*3/uL — ABNORMAL LOW (ref 150–400)
RBC: 2.96 MIL/uL — AB (ref 4.22–5.81)
RDW: 15.3 % (ref 11.5–15.5)
WBC: 7.7 10*3/uL (ref 4.0–10.5)

## 2017-05-13 LAB — AMMONIA: Ammonia: 55 umol/L — ABNORMAL HIGH (ref 9–35)

## 2017-05-13 MED ORDER — DEXTROSE 5 % IV SOLN
2.0000 g | INTRAVENOUS | Status: DC
  Administered 2017-05-13 – 2017-05-14 (×2): 2 g via INTRAVENOUS
  Filled 2017-05-13 (×2): qty 2

## 2017-05-13 MED ORDER — SODIUM CHLORIDE 0.9 % IV SOLN
INTRAVENOUS | Status: AC
  Filled 2017-05-13: qty 250

## 2017-05-13 MED ORDER — IOPAMIDOL (ISOVUE-300) INJECTION 61%: 100.0000 mL | Freq: Once | INTRAVENOUS | Status: DC | PRN

## 2017-05-13 MED ORDER — IOPAMIDOL (ISOVUE-300) INJECTION 61%
125.0000 mL | Freq: Once | INTRAVENOUS | Status: AC | PRN
  Administered 2017-05-13: 09:00:00 via INTRAVENOUS

## 2017-05-13 MED ORDER — PIPERACILLIN-TAZOBACTAM 3.375 G IVPB
3.3750 g | Freq: Three times a day (TID) | INTRAVENOUS | Status: DC
  Administered 2017-05-13: 3.375 g via INTRAVENOUS
  Filled 2017-05-13: qty 50

## 2017-05-13 MED ORDER — IOPAMIDOL (ISOVUE-300) INJECTION 61%
INTRAVENOUS | Status: AC
  Filled 2017-05-13: qty 150

## 2017-05-13 NOTE — Progress Notes (Signed)
PHARMACY - PHYSICIAN COMMUNICATION CRITICAL VALUE ALERT - BLOOD CULTURE IDENTIFICATION (BCID)  Results for orders placed or performed during the hospital encounter of 05/12/17  Blood Culture ID Panel (Reflexed) (Collected: 05/12/2017  3:03 PM)  Result Value Ref Range   Enterococcus species NOT DETECTED NOT DETECTED   Listeria monocytogenes NOT DETECTED NOT DETECTED   Staphylococcus species NOT DETECTED NOT DETECTED   Staphylococcus aureus NOT DETECTED NOT DETECTED   Streptococcus species DETECTED (A) NOT DETECTED   Streptococcus agalactiae NOT DETECTED NOT DETECTED   Streptococcus pneumoniae NOT DETECTED NOT DETECTED   Streptococcus pyogenes NOT DETECTED NOT DETECTED   Acinetobacter baumannii NOT DETECTED NOT DETECTED   Enterobacteriaceae species NOT DETECTED NOT DETECTED   Enterobacter cloacae complex NOT DETECTED NOT DETECTED   Escherichia coli NOT DETECTED NOT DETECTED   Klebsiella oxytoca NOT DETECTED NOT DETECTED   Klebsiella pneumoniae NOT DETECTED NOT DETECTED   Proteus species NOT DETECTED NOT DETECTED   Serratia marcescens NOT DETECTED NOT DETECTED   Haemophilus influenzae NOT DETECTED NOT DETECTED   Neisseria meningitidis NOT DETECTED NOT DETECTED   Pseudomonas aeruginosa NOT DETECTED NOT DETECTED   Candida albicans NOT DETECTED NOT DETECTED   Candida glabrata NOT DETECTED NOT DETECTED   Candida krusei NOT DETECTED NOT DETECTED   Candida parapsilosis NOT DETECTED NOT DETECTED   Candida tropicalis NOT DETECTED NOT DETECTED    Name of physician (or Provider) Contacted: Dr. Vista Lawman  Changes to prescribed antibiotics required: Transition vanc and Zosyn to ceftriaxone 2 gr IV q24h  Royetta Asal, PharmD, BCPS Pager [phone] 05/13/2017 8:51 AM

## 2017-05-13 NOTE — Progress Notes (Signed)
[facility]                                                                              Patient Demographics  Adam Butler, is a 61 y.o. male, DOB - June 28, 1956, CNO:[id number]  Admit date - 05/12/2017   Admitting Physician Jani Gravel, MD  Outpatient Primary MD for the patient is [name], NP  Outpatient specialists:   LOS - 1  days    Chief Complaint  Patient presents with  . Blood Infection       Brief summary  Adam Butler  is a 61 y.o. male, with past medical history significant for but not limited to dm2, hypertension, hyperlpidemia, CAD, CHF, hepatic cirrhosis ([place]),with esophageal varices,/ prior hepatic encephalopathy presenting with lethargic, fever and slight dry cough as well as diarrhea (on lactulose).  Chest x-ray was negative for acute infiltrate, cardiomegaly with probable small bilateral pleural effusion noted, labs elevated ammonia 80, up from baseline of about 40, no leukocytosis.     Assessment & Plan    Principal Problem:   Fever Active Problems:   Liver cirrhosis secondary to NASH (HCC)   Hypokalemia   Anemia   Diabetes mellitus type 2, uncontrolled, with complications (HCC)   Thrombocytopenia (HCC)  #1 strep bacteremia: Blood culture x2 pending reports 05/13/2017+ for strep Discontinue Vanco and Zosyn and narrow IV antibiotic to ceftriaxone Follow-up full culture results  #2 encephalopathy: Supportive care Continue lactulose with Aldactone and rifaximin Trend ammonia levels-improvement  #3 thrombus cytopenia with anemia: Not new Follow clinically  #4 microscopic hematuria: UA cleared  #5 diabetes mellitus: Glycemic control    Code Status: Full code DVT Prophylaxis: SCD's Family Communication: Discussed in detail with the patient, all imaging results, lab results explained to the patient    Disposition Plan: To be determined  Time Spent in minutes  45 minutes  Procedures:    Consultants:      Antimicrobials:      Medications  Scheduled Meds: . aspirin EC  81 mg Oral Daily   And  . calcium carbonate  1 tablet Oral Daily  . atorvastatin  10 mg Oral Daily  . famotidine  10 mg Oral BID  . feeding supplement (ENSURE ENLIVE)  237 mL Oral BID BM  . feeding supplement (PRO-STAT SUGAR FREE 64)  30 mL Oral BID  . insulin aspart  0-5 Units Subcutaneous QHS  . insulin aspart  0-9 Units Subcutaneous TID WC  . insulin glargine  8 Units Subcutaneous QHS  . iopamidol      . lactulose  20 g Oral QHS  . multivitamin with minerals  1 tablet Oral Daily  . PARoxetine  10 mg Oral Daily  . rifaximin  550 mg Oral BID  . sodium chloride flush  3 mL Intravenous Q12H  . spironolactone  12.5 mg Oral Daily  . vitamin B-12  1,000 mcg Oral Daily   Continuous Infusions: . sodium chloride    . cefTRIAXone (ROCEPHIN)  IV 2 g (05/13/17 1154)  . sodium chloride     PRN Meds:.sodium chloride, acetaminophen **OR** acetaminophen, fluticasone, nitroGLYCERIN, sodium chloride flush   Antibiotics  Anti-infectives    Start     Dose/Rate Route Frequency Ordered Stop   05/13/17 1000  cefTRIAXone (ROCEPHIN) 2 g in dextrose 5 % 50 mL IVPB     2 g 100 mL/hr over 30 Minutes Intravenous Every 24 hours 05/13/17 0851     05/13/17 0300  piperacillin-tazobactam (ZOSYN) IVPB 3.375 g  Status:  Discontinued     3.375 g 12.5 mL/hr over 240 Minutes Intravenous Every 8 hours 05/13/17 0246 05/13/17 0844   05/12/17 2359  vancomycin (VANCOCIN) 1,250 mg in sodium chloride 0.9 % 250 mL IVPB  Status:  Discontinued     1,250 mg 166.7 mL/hr over 90 Minutes Intravenous Every 12 hours 05/12/17 2150 05/13/17 0844   05/12/17 2200  rifaximin (XIFAXAN) tablet 550 mg     550 mg Oral 2 times daily 05/12/17 1951     05/12/17 1600  piperacillin-tazobactam (ZOSYN) IVPB 3.375 g     3.375 g 100 mL/hr over 30 Minutes Intravenous  Once 05/12/17 1547 05/12/17 1623   05/12/17 1600  vancomycin (VANCOCIN) IVPB 1000 mg/200 mL premix      1,000 mg 200 mL/hr over 60 Minutes Intravenous  Once 05/12/17 1547 05/12/17 1738        Subjective:   Adam Butler was seen and examined today.  H&P by admitting physician reviewed noted.  No fever or chills.  No abdominal pain. Objective:   Vitals:   05/12/17 1800 05/12/17 2042 05/12/17 2046 05/13/17 0507  BP: 105/60 109/60  100/75  Pulse: 70 66  75  Resp: (!) 23 (!) 22  20  Temp:  98.4 F (36.9 C)  98 F (36.7 C)  TempSrc:  Oral  Oral  SpO2: 96% 99%  98%  Weight:   130.4 kg (287 lb 7.7 oz)   Height:   5' 7"  (1.702 m)     Intake/Output Summary (Last 24 hours) at 05/13/17 1221 Last data filed at 05/13/17 0107  Gross per 24 hour  Intake             1270 ml  Output                0 ml  Net             1270 ml     Wt Readings from Last 3 Encounters:  05/12/17 130.4 kg (287 lb 7.7 oz)  05/08/17 129.4 kg (285 lb 3.2 oz)  05/02/17 130.2 kg (287 lb)     Exam  General: NAD  HEENT: NCAT,  PERRL,MMM  Neck: SUPPLE, (-) JVD  Cardiovascular: RRR, (-) GALLOP, (-) MURMUR  Respiratory: CTA  Gastrointestinal: SOFT, (-) DISTENSION, BS(+), (_) TENDERNESS  Ext: (-) CYANOSIS, (-) EDEMA  Neuro: A, OX 3  Skin:(-) RASH  Psych:NORMAL AFFECT/MOOD   Data Reviewed:  I have personally reviewed following labs and imaging studies  Micro Results Recent Results (from the past 240 hour(s))  Culture, blood (Routine x 2)     Status: None (Preliminary result)   Collection Time: 05/12/17  3:03 PM  Result Value Ref Range Status   Specimen Description BLOOD RIGHT HAND  Final   Special Requests   Final    BOTTLES DRAWN AEROBIC AND ANAEROBIC Blood Culture adequate volume   Culture  Setup Time   Final    GRAM POSITIVE COCCI IN PAIRS AND CHAINS IN BOTH AEROBIC AND ANAEROBIC BOTTLES CRITICAL RESULT CALLED TO, READ BACK BY AND VERIFIED WITH: N GLOGOVAC,PHARMD AT 0725 05/13/17 BY L BENFIELD Performed at [facility]  [facility], [place] [address], [place], [place] [address]    Culture  GRAM POSITIVE COCCI  Final   Report Status PENDING  Incomplete  Blood Culture ID Panel (Reflexed)     Status: Abnormal   Collection Time: 05/12/17  3:03 PM  Result Value Ref Range Status   Enterococcus species NOT DETECTED NOT DETECTED Final   Listeria monocytogenes NOT DETECTED NOT DETECTED Final   Staphylococcus species NOT DETECTED NOT DETECTED Final   Staphylococcus aureus NOT DETECTED NOT DETECTED Final   Streptococcus species DETECTED (A) NOT DETECTED Final    Comment: Not Enterococcus species, Streptococcus agalactiae, Streptococcus pyogenes, or Streptococcus pneumoniae. CRITICAL RESULT CALLED TO, READ BACK BY AND VERIFIED WITH: N GLOGOVAC,PHARMD AT 0725 05/13/17 BY L BENFIELD    Streptococcus agalactiae NOT DETECTED NOT DETECTED Final   Streptococcus pneumoniae NOT DETECTED NOT DETECTED Final   Streptococcus pyogenes NOT DETECTED NOT DETECTED Final   Acinetobacter baumannii NOT DETECTED NOT DETECTED Final   Enterobacteriaceae species NOT DETECTED NOT DETECTED Final   Enterobacter cloacae complex NOT DETECTED NOT DETECTED Final   Escherichia coli NOT DETECTED NOT DETECTED Final   Klebsiella oxytoca NOT DETECTED NOT DETECTED Final   Klebsiella pneumoniae NOT DETECTED NOT DETECTED Final   Proteus species NOT DETECTED NOT DETECTED Final   Serratia marcescens NOT DETECTED NOT DETECTED Final   Haemophilus influenzae NOT DETECTED NOT DETECTED Final   Neisseria meningitidis NOT DETECTED NOT DETECTED Final   Pseudomonas aeruginosa NOT DETECTED NOT DETECTED Final   Candida albicans NOT DETECTED NOT DETECTED Final   Candida glabrata NOT DETECTED NOT DETECTED Final   Candida krusei NOT DETECTED NOT DETECTED Final   Candida parapsilosis NOT DETECTED NOT DETECTED Final   Candida tropicalis NOT DETECTED NOT DETECTED Final    Comment: Performed at [facility], [place]. [address] [place], [place] [address]  Culture, blood (Routine x 2)     Status: None (Preliminary result)   Collection  Time: 05/12/17  3:55 PM  Result Value Ref Range Status   Specimen Description BLOOD LEFT HAND  Final   Special Requests   Final    BOTTLES DRAWN AEROBIC AND ANAEROBIC Blood Culture adequate volume   Culture  Setup Time   Final    GRAM POSITIVE COCCI IN PAIRS AND CHAINS IN BOTH AEROBIC AND ANAEROBIC BOTTLES CRITICAL VALUE NOTED.  VALUE IS CONSISTENT WITH PREVIOUSLY REPORTED AND CALLED VALUE. Performed at [facility], [place] [address] [place], [place] [address]    Culture GRAM POSITIVE COCCI  Final   Report Status PENDING  Incomplete    Radiology Reports Dg Chest 2 View  Result Date: 05/12/2017 CLINICAL DATA:  Fever and status changes. Coronary artery disease. CHF. Diabetic. Smoker. EXAM: CHEST  2 VIEW COMPARISON:  04/27/2017 FINDINGS: Both views are degraded by patient body habitus and AP portable frontal technique. Midline trachea. Mild cardiomegaly. Atherosclerosis in the transverse aorta. Suspect tiny bilateral pleural effusions. No pneumothorax. Mild pulmonary venous congestion, accentuated by AP portable frontal technique. No well-defined lobar consolidation. IMPRESSION: Cardiomegaly with mild pulmonary venous congestion and probable small bilateral pleural effusions. Aortic Atherosclerosis (ICD10-I70.0). Decreased sensitivity and specificity exam due to technique related factors, as described above. Electronically Signed   By: Abigail Miyamoto M.D.   On: 05/12/2017 15:46   Ct Head Wo Contrast  Result Date: 04/27/2017 CLINICAL DATA:  Fever and altered level of consciousness. EXAM: CT HEAD WITHOUT CONTRAST TECHNIQUE: Contiguous axial images were obtained from the base of the  skull through the vertex without intravenous contrast. COMPARISON:  02/01/2017 FINDINGS: Brain: Stable chronic small vessel ischemia. Chronic focus of encephalomalacia involving the right occipital lobe. No large vascular territory infarct. No acute intracranial hemorrhage, mass or midline shift. No extra-axial fluid  collections. Vascular: No hyperdense vessels. Moderate atherosclerosis of the cavernous sinus carotids and left vertebral arteries. Skull: Incomplete posterior arch of C1, developmental in appearance. Sinuses/Orbits: No acute finding. Mild right maxillary sinus atelectasis. Other: None IMPRESSION: 1. Chronic stable small vessel ischemic disease. 2. Chronic right occipital encephalomalacia. 3. No acute intracranial abnormality. Electronically Signed   By: Ashley Royalty M.D.   On: 04/27/2017 20:03   Ct Chest Wo Contrast  Result Date: 04/28/2017 CLINICAL DATA:  Possible pulmonary nodules. EXAM: CT CHEST WITHOUT CONTRAST TECHNIQUE: Multidetector CT imaging of the chest was performed following the standard protocol without IV contrast. COMPARISON:  Radiograph of April 27, 2017. FINDINGS: Cardiovascular: 4.1 cm ascending thoracic aortic aneurysm is noted. Atherosclerosis of thoracic aorta is noted. Coronary artery calcifications are noted. Normal cardiac size. No pericardial effusion is noted. Mediastinum/Nodes: Small sliding-type hiatal hernia is noted. Thyroid gland is unremarkable. No significant mediastinal adenopathy is noted. Lungs/Pleura: No pneumothorax or pleural effusion is noted. Emphysematous disease is noted in both upper lobes, predominantly on the right. No pulmonary nodules are noted. No acute pulmonary disease is noted. Upper Abdomen: Hepatic cirrhosis is noted. Mild splenomegaly is noted. Collateral veins are noted as well as paraesophageal varices. Cholelithiasis is noted. Musculoskeletal: No chest wall mass or suspicious bone lesions identified. IMPRESSION: 4.1 cm ascending thoracic aortic aneurysm. Recommend annual imaging followup by CTA or MRA. This recommendation follows 2010 ACCF/AHA/AATS/ACR/ASA/SCA/SCAI/SIR/STS/SVM Guidelines for the Diagnosis and Management of Patients with Thoracic Aortic Disease. Circulation. 2010; 121: V948-A165. No definite pulmonary nodules are noted. Coronary artery  calcifications are noted suggesting coronary artery disease. Small sliding-type hiatal hernia is noted. Hepatic cirrhosis is noted with mild splenomegaly suggesting portal hypertension. Paraesophageal varices are noted as well. Aortic Atherosclerosis (ICD10-I70.0) and Emphysema (ICD10-J43.9). Electronically Signed   By: Marijo Conception, M.D.   On: 04/28/2017 11:46   Dg Chest Port 1 View  Result Date: 04/27/2017 CLINICAL DATA:  Fever EXAM: PORTABLE CHEST 1 VIEW COMPARISON:  03/02/2017 dating back to 01/31/2017 FINDINGS: The cardiopericardial silhouette is mildly enlarged, some which is likely due to portable technique and low lung volumes. The aorta is not aneurysmal. Low lung volumes without pneumonic consolidation, effusion or pneumothorax. Rounded densities about the right hilum likely reflect vessels seen on end and central vascular congestion. Pulmonary nodules are not entirely excluded but believed less likely. No pneumonic consolidation. No effusion or pneumothorax. The visualized skeletal structures are unremarkable. IMPRESSION: 1. Low lung volumes with crowding of interstitial lung markings. Mild central vascular congestion. 2. Nodular densities about the right hilum are new and likely reflect pulmonary vessels seen on end. As pulmonary nodules are not entirely excluded, follow-up radiographs with PA and lateral views or chest CT may help exclude pulmonary nodules. Electronically Signed   By: Ashley Royalty M.D.   On: 04/27/2017 20:00    Lab Data:  CBC:  Recent Labs Lab 05/12/17 1502 05/13/17 0533  WBC 9.6 7.7  NEUTROABS 7.7  --   HGB 10.5* 10.1*  HCT 30.0* 29.6*  MCV 98.4 100.0  PLT 46* 30*   Basic Metabolic Panel:  Recent Labs Lab 05/12/17 1502 05/12/17 1609 05/13/17 0533  NA 134*  --  138  K 2.8*  --  3.6  CL 104  --  107  CO2 24  --  25  GLUCOSE 180*  --  107*  BUN 7  --  8  CREATININE 0.81  --  0.65  CALCIUM 7.1*  --  7.2*  MG  --  1.3*  --    GFR: Estimated  Creatinine Clearance: 125.9 mL/min (by C-G formula based on SCr of 0.65 mg/dL). Liver Function Tests:  Recent Labs Lab 05/12/17 1502 05/13/17 0533  AST 66* 63*  ALT 39 34  ALKPHOS 125 94  BILITOT 2.4* 3.4*  PROT 6.0* 5.6*  ALBUMIN 2.1* 1.9*   No results for input(s): LIPASE, AMYLASE in the last 168 hours.  Recent Labs Lab 05/12/17 1552 05/13/17 0533  AMMONIA 80* 55*   Coagulation Profile:  Recent Labs Lab 05/12/17 1502  INR 1.76   Cardiac Enzymes: No results for input(s): CKTOTAL, CKMB, CKMBINDEX, TROPONINI in the last 168 hours. BNP (last 3 results) No results for input(s): PROBNP in the last 8760 hours. HbA1C: No results for input(s): HGBA1C in the last 72 hours. CBG:  Recent Labs Lab 05/12/17 1452 05/12/17 2345  GLUCAP 172* 120*   Lipid Profile: No results for input(s): CHOL, HDL, LDLCALC, TRIG, CHOLHDL, LDLDIRECT in the last 72 hours. Thyroid Function Tests: No results for input(s): TSH, T4TOTAL, FREET4, T3FREE, THYROIDAB in the last 72 hours. Anemia Panel: No results for input(s): VITAMINB12, FOLATE, FERRITIN, TIBC, IRON, RETICCTPCT in the last 72 hours. Urine analysis:    Component Value Date/Time   COLORURINE AMBER (A) 05/12/2017 1735   APPEARANCEUR CLEAR 05/12/2017 1735   LABSPEC 1.018 05/12/2017 1735   PHURINE 6.0 05/12/2017 1735   GLUCOSEU NEGATIVE 05/12/2017 1735   HGBUR MODERATE (A) 05/12/2017 1735   BILIRUBINUR NEGATIVE 05/12/2017 1735   KETONESUR NEGATIVE 05/12/2017 1735   PROTEINUR NEGATIVE 05/12/2017 1735   UROBILINOGEN 0.2 05/14/2015 1813   NITRITE NEGATIVE 05/12/2017 1735   LEUKOCYTESUR NEGATIVE 05/12/2017 [place] M.D. [facility] 05/13/2017, 12:21 PM  Pager: [phone] Between 7am to 7pm - call Pager - [phone]  After 7pm go to [email or web address] - password [email or web address]  Call night coverage person covering after 7pm

## 2017-05-14 ENCOUNTER — Other Ambulatory Visit: Payer: Self-pay

## 2017-05-14 DIAGNOSIS — I8510 Secondary esophageal varices without bleeding: Secondary | ICD-10-CM

## 2017-05-14 DIAGNOSIS — Z794 Long term (current) use of insulin: Secondary | ICD-10-CM

## 2017-05-14 DIAGNOSIS — Z8249 Family history of ischemic heart disease and other diseases of the circulatory system: Secondary | ICD-10-CM

## 2017-05-14 DIAGNOSIS — I868 Varicose veins of other specified sites: Secondary | ICD-10-CM

## 2017-05-14 DIAGNOSIS — Z836 Family history of other diseases of the respiratory system: Secondary | ICD-10-CM

## 2017-05-14 DIAGNOSIS — K7469 Other cirrhosis of liver: Secondary | ICD-10-CM

## 2017-05-14 DIAGNOSIS — Z79899 Other long term (current) drug therapy: Secondary | ICD-10-CM

## 2017-05-14 DIAGNOSIS — Z888 Allergy status to other drugs, medicaments and biological substances status: Secondary | ICD-10-CM

## 2017-05-14 DIAGNOSIS — K7290 Hepatic failure, unspecified without coma: Secondary | ICD-10-CM

## 2017-05-14 DIAGNOSIS — E119 Type 2 diabetes mellitus without complications: Secondary | ICD-10-CM

## 2017-05-14 DIAGNOSIS — F17210 Nicotine dependence, cigarettes, uncomplicated: Secondary | ICD-10-CM

## 2017-05-14 DIAGNOSIS — Z833 Family history of diabetes mellitus: Secondary | ICD-10-CM

## 2017-05-14 LAB — RESPIRATORY PANEL BY PCR
Adenovirus: NOT DETECTED
Bordetella pertussis: NOT DETECTED
CHLAMYDOPHILA PNEUMONIAE-RVPPCR: NOT DETECTED
CORONAVIRUS OC43-RVPPCR: NOT DETECTED
Coronavirus 229E: NOT DETECTED
Coronavirus HKU1: NOT DETECTED
Coronavirus NL63: NOT DETECTED
INFLUENZA A-RVPPCR: NOT DETECTED
INFLUENZA B-RVPPCR: NOT DETECTED
MYCOPLASMA PNEUMONIAE-RVPPCR: NOT DETECTED
Metapneumovirus: NOT DETECTED
PARAINFLUENZA VIRUS 3-RVPPCR: NOT DETECTED
PARAINFLUENZA VIRUS 4-RVPPCR: NOT DETECTED
Parainfluenza Virus 1: NOT DETECTED
Parainfluenza Virus 2: NOT DETECTED
RESPIRATORY SYNCYTIAL VIRUS-RVPPCR: NOT DETECTED
RHINOVIRUS / ENTEROVIRUS - RVPPCR: NOT DETECTED

## 2017-05-14 LAB — GASTROINTESTINAL PANEL BY PCR, STOOL (REPLACES STOOL CULTURE)
ASTROVIRUS: NOT DETECTED
Adenovirus F40/41: NOT DETECTED
Campylobacter species: NOT DETECTED
Cryptosporidium: NOT DETECTED
Cyclospora cayetanensis: NOT DETECTED
ENTAMOEBA HISTOLYTICA: NOT DETECTED
Enteroaggregative E coli (EAEC): NOT DETECTED
Enteropathogenic E coli (EPEC): NOT DETECTED
Enterotoxigenic E coli (ETEC): NOT DETECTED
GIARDIA LAMBLIA: NOT DETECTED
NOROVIRUS GI/GII: NOT DETECTED
Plesimonas shigelloides: NOT DETECTED
ROTAVIRUS A: NOT DETECTED
SALMONELLA SPECIES: NOT DETECTED
SAPOVIRUS (I, II, IV, AND V): NOT DETECTED
SHIGELLA/ENTEROINVASIVE E COLI (EIEC): NOT DETECTED
Shiga like toxin producing E coli (STEC): NOT DETECTED
Vibrio cholerae: NOT DETECTED
Vibrio species: NOT DETECTED
Yersinia enterocolitica: DETECTED — AB

## 2017-05-14 LAB — GLUCOSE, CAPILLARY
GLUCOSE-CAPILLARY: 84 mg/dL (ref 65–99)
Glucose-Capillary: 197 mg/dL — ABNORMAL HIGH (ref 65–99)
Glucose-Capillary: 210 mg/dL — ABNORMAL HIGH (ref 65–99)
Glucose-Capillary: 186 mg/dL — ABNORMAL HIGH (ref 65–99)

## 2017-05-14 MED ORDER — SODIUM CHLORIDE 0.9 % IV SOLN
500.0000 mg | Freq: Four times a day (QID) | INTRAVENOUS | Status: DC
  Administered 2017-05-14 – 2017-05-16 (×7): 500 mg via INTRAVENOUS
  Filled 2017-05-14 (×8): qty 500

## 2017-05-14 NOTE — Progress Notes (Signed)
CRITICAL VALUE ALERT  Critical Value:  Stool sample positive for Yersinia Enterocolitica  Date & Time Notied:  05/14/2017   4917  Provider Notified: Dr. Maudie Mercury  Orders Received/Actions taken:

## 2017-05-14 NOTE — Progress Notes (Signed)
[facility]                                                                              Patient Demographics  Adam Butler, is a 61 y.o. male, DOB - 1956-02-19, MBE:[id number]  Admit date - 05/12/2017   Admitting Physician Jani Gravel, MD  Outpatient Primary MD for the patient is [name], NP  Outpatient specialists:   LOS - 2  days    Chief Complaint  Patient presents with  . Blood Infection       Brief summary  Adam Butler  is a 62 y.o. male, with past medical history significant for but not limited to dm2, hypertension, hyperlpidemia, CAD, CHF, hepatic cirrhosis (nash),with esophageal varices,/ prior hepatic encephalopathy presenting with lethargic, fever and slight dry cough as well as diarrhea (on lactulose).  Chest x-ray was negative for acute infiltrate, cardiomegaly with probable small bilateral pleural effusion noted, labs elevated ammonia 80, up from baseline of about 40, no leukocytosis.     Assessment & Plan    Principal Problem:   Fever Active Problems:   Liver cirrhosis secondary to NASH (HCC)   Hypokalemia   Anemia   Diabetes mellitus type 2, uncontrolled, with complications (HCC)   Thrombocytopenia (HCC)  #1 strep bacteremia: Blood culture x2 pending reports 05/13/2017+ for strep Discontinue Vanco and Zosyn and narrow IV antibiotic to ceftriaxone Follow-up full culture results Dr Baxter Flattery ID consulted for abx management  #2 encephalopathy: Supportive care Continue lactulose with Aldactone and rifaximin Trend ammonia levels-improvement  #3 thrombus cytopenia with anemia: Not new Follow clinically  #4 microscopic hematuria: UA cleared  #5 diabetes mellitus: Glycemic control    Code Status: Full code DVT Prophylaxis: SCD's Family Communication: Discussed in detail with the patient, all imaging results, lab results explained to the patient    Disposition Plan: To be determined  Time Spent in minutes  45  minutes  Procedures:    Consultants:     Antimicrobials:      Medications  Scheduled Meds: . aspirin EC  81 mg Oral Daily   And  . calcium carbonate  1 tablet Oral Daily  . atorvastatin  10 mg Oral Daily  . famotidine  10 mg Oral BID  . feeding supplement (PRO-STAT SUGAR FREE 64)  30 mL Oral BID  . insulin aspart  0-5 Units Subcutaneous QHS  . insulin aspart  0-9 Units Subcutaneous TID WC  . insulin glargine  8 Units Subcutaneous QHS  . lactulose  20 g Oral QHS  . multivitamin with minerals  1 tablet Oral Daily  . PARoxetine  10 mg Oral Daily  . rifaximin  550 mg Oral BID  . sodium chloride flush  3 mL Intravenous Q12H  . spironolactone  12.5 mg Oral Daily  . vitamin B-12  1,000 mcg Oral Daily   Continuous Infusions: . sodium chloride    . imipenem-cilastatin     PRN Meds:.sodium chloride, acetaminophen **OR** acetaminophen, fluticasone, nitroGLYCERIN, sodium chloride flush   Antibiotics   Anti-infectives (From admission, onward)   Start     Dose/Rate Route Frequency Ordered Stop   05/14/17 1800  imipenem-cilastatin (PRIMAXIN) 500 mg in sodium chloride 0.9 % 100 mL IVPB     500 mg 200 mL/hr over 30 Minutes Intravenous Every 6 hours 05/14/17 1702     05/13/17 1000  cefTRIAXone (ROCEPHIN) 2 g in dextrose 5 % 50 mL IVPB  Status:  Discontinued     2 g 100 mL/hr over 30 Minutes Intravenous Every 24 hours 05/13/17 0851 05/14/17 1657   05/13/17 0300  piperacillin-tazobactam (ZOSYN) IVPB 3.375 g  Status:  Discontinued     3.375 g 12.5 mL/hr over 240 Minutes Intravenous Every 8 hours 05/13/17 0246 05/13/17 0844   05/12/17 2359  vancomycin (VANCOCIN) 1,250 mg in sodium chloride 0.9 % 250 mL IVPB  Status:  Discontinued     1,250 mg 166.7 mL/hr over 90 Minutes Intravenous Every 12 hours 05/12/17 2150 05/13/17 0844   05/12/17 2200  rifaximin (XIFAXAN) tablet 550 mg     550 mg Oral 2 times daily 05/12/17 1951     05/12/17 1600  piperacillin-tazobactam (ZOSYN) IVPB 3.375 g      3.375 g 100 mL/hr over 30 Minutes Intravenous  Once 05/12/17 1547 05/12/17 1623   05/12/17 1600  vancomycin (VANCOCIN) IVPB 1000 mg/200 mL premix     1,000 mg 200 mL/hr over 60 Minutes Intravenous  Once 05/12/17 1547 05/12/17 1738        Subjective:   Adam Butler was seen and examined today.  H&P by admitting physician reviewed noted.  No fever or chills.  No abdominal pain. Objective:   Vitals:   05/13/17 1433 05/13/17 2107 05/14/17 0607 05/14/17 1502  BP: (!) 112/49 (!) 112/52 (!) 104/49 (!) 124/51  Pulse: 72 67 68 64  Resp: 20 18 20 18   Temp: 97.8 F (36.6 C) 98 F (36.7 C) 98.2 F (36.8 C) 97.6 F (36.4 C)  TempSrc: Oral Oral Oral Oral  SpO2: 98% 99% 97% 100%  Weight:   128.6 kg (283 lb 9.6 oz)   Height:        Intake/Output Summary (Last 24 hours) at 05/14/2017 1724 Last data filed at 05/14/2017 0600 Gross per 24 hour  Intake 290 ml  Output -  Net 290 ml     Wt Readings from Last 3 Encounters:  05/14/17 128.6 kg (283 lb 9.6 oz)  05/08/17 129.4 kg (285 lb 3.2 oz)  05/02/17 130.2 kg (287 lb)     Exam  General: NAD  HEENT: NCAT,  PERRL,MMM  Neck: SUPPLE, (-) JVD  Cardiovascular: RRR, (-) GALLOP, (-) MURMUR  Respiratory: CTA  Gastrointestinal: SOFT, (-) DISTENSION, BS(+), (_) TENDERNESS  Ext: (-) CYANOSIS, (-) EDEMA  Neuro: A, OX 3  Skin:(-) RASH  Psych:NORMAL AFFECT/MOOD   Data Reviewed:  I have personally reviewed following labs and imaging studies  Micro Results Recent Results (from the past 240 hour(s))  Culture, blood (Routine x 2)     Status: Abnormal (Preliminary result)   Collection Time: 05/12/17  3:03 PM  Result Value Ref Range Status   Specimen Description BLOOD RIGHT HAND  Final   Special Requests   Final    BOTTLES DRAWN AEROBIC AND ANAEROBIC Blood Culture adequate volume   Culture  Setup Time   Final    GRAM POSITIVE COCCI IN PAIRS AND CHAINS IN BOTH AEROBIC AND ANAEROBIC BOTTLES CRITICAL RESULT CALLED TO, READ  BACK BY AND VERIFIED WITH: N GLOGOVAC,PHARMD AT 0725 05/13/17 BY L BENFIELD    Culture (A)  Final    STREPTOCOCCUS GALLOLYTICUS SUSCEPTIBILITIES TO FOLLOW Performed at  [facility], [place] [address], [place], [place] [address]    Report Status PENDING  Incomplete  Blood Culture ID Panel (Reflexed)     Status: Abnormal   Collection Time: 05/12/17  3:03 PM  Result Value Ref Range Status   Enterococcus species NOT DETECTED NOT DETECTED Final   Listeria monocytogenes NOT DETECTED NOT DETECTED Final   Staphylococcus species NOT DETECTED NOT DETECTED Final   Staphylococcus aureus NOT DETECTED NOT DETECTED Final   Streptococcus species DETECTED (A) NOT DETECTED Final    Comment: Not Enterococcus species, Streptococcus agalactiae, Streptococcus pyogenes, or Streptococcus pneumoniae. CRITICAL RESULT CALLED TO, READ BACK BY AND VERIFIED WITH: N GLOGOVAC,PHARMD AT 0725 05/13/17 BY L BENFIELD    Streptococcus agalactiae NOT DETECTED NOT DETECTED Final   Streptococcus pneumoniae NOT DETECTED NOT DETECTED Final   Streptococcus pyogenes NOT DETECTED NOT DETECTED Final   Acinetobacter baumannii NOT DETECTED NOT DETECTED Final   Enterobacteriaceae species NOT DETECTED NOT DETECTED Final   Enterobacter cloacae complex NOT DETECTED NOT DETECTED Final   Escherichia coli NOT DETECTED NOT DETECTED Final   Klebsiella oxytoca NOT DETECTED NOT DETECTED Final   Klebsiella pneumoniae NOT DETECTED NOT DETECTED Final   Proteus species NOT DETECTED NOT DETECTED Final   Serratia marcescens NOT DETECTED NOT DETECTED Final   Haemophilus influenzae NOT DETECTED NOT DETECTED Final   Neisseria meningitidis NOT DETECTED NOT DETECTED Final   Pseudomonas aeruginosa NOT DETECTED NOT DETECTED Final   Candida albicans NOT DETECTED NOT DETECTED Final   Candida glabrata NOT DETECTED NOT DETECTED Final   Candida krusei NOT DETECTED NOT DETECTED Final   Candida parapsilosis NOT DETECTED NOT DETECTED Final   Candida  tropicalis NOT DETECTED NOT DETECTED Final    Comment: Performed at [facility], [place]. [address] [place], [place] [address]  Culture, blood (Routine x 2)     Status: Abnormal (Preliminary result)   Collection Time: 05/12/17  3:55 PM  Result Value Ref Range Status   Specimen Description BLOOD LEFT HAND  Final   Special Requests   Final    BOTTLES DRAWN AEROBIC AND ANAEROBIC Blood Culture adequate volume   Culture  Setup Time   Final    GRAM POSITIVE COCCI IN PAIRS AND CHAINS IN BOTH AEROBIC AND ANAEROBIC BOTTLES CRITICAL VALUE NOTED.  VALUE IS CONSISTENT WITH PREVIOUSLY REPORTED AND CALLED VALUE.    Culture (A)  Final    STREPTOCOCCUS GALLOLYTICUS ACINETOBACTER CALCOACETICUS/BAUMANNII COMPLEX CRITICAL RESULT CALLED TO, READ BACK BY AND VERIFIED WITH: J GADHIA,PHARMD AT 1010 05/14/17 BY L BENFIELD CONCERNING GROWTH ON CULTURE Performed at [facility], [place] [address], [place], [place] [address]    Report Status PENDING  Incomplete  Respiratory Panel by PCR     Status: None   Collection Time: 05/12/17  7:58 PM  Result Value Ref Range Status   Adenovirus NOT DETECTED NOT DETECTED Final   Coronavirus 229E NOT DETECTED NOT DETECTED Final   Coronavirus HKU1 NOT DETECTED NOT DETECTED Final   Coronavirus NL63 NOT DETECTED NOT DETECTED Final   Coronavirus OC43 NOT DETECTED NOT DETECTED Final   Metapneumovirus NOT DETECTED NOT DETECTED Final   Rhinovirus / Enterovirus NOT DETECTED NOT DETECTED Final   Influenza A NOT DETECTED NOT DETECTED Final   Influenza B NOT DETECTED NOT DETECTED Final   Parainfluenza Virus 1 NOT DETECTED NOT DETECTED Final   Parainfluenza Virus 2 NOT DETECTED NOT DETECTED Final   Parainfluenza Virus 3 NOT DETECTED NOT DETECTED Final  Parainfluenza Virus 4 NOT DETECTED NOT DETECTED Final   Respiratory Syncytial Virus NOT DETECTED NOT DETECTED Final   Bordetella pertussis NOT DETECTED NOT DETECTED Final   Chlamydophila pneumoniae NOT DETECTED NOT DETECTED  Final   Mycoplasma pneumoniae NOT DETECTED NOT DETECTED Final    Comment: Performed at [facility], [place] [address] [place], [place] [address]  Gastrointestinal Panel by PCR , Stool     Status: Abnormal   Collection Time: 05/12/17  7:58 PM  Result Value Ref Range Status   Campylobacter species NOT DETECTED NOT DETECTED Final   Plesimonas shigelloides NOT DETECTED NOT DETECTED Final   Salmonella species NOT DETECTED NOT DETECTED Final   Yersinia enterocolitica DETECTED (A) NOT DETECTED Final    Comment: RESULT CALLED TO, READ BACK BY AND VERIFIED WITH:  Randa Lynn AT 0624 05/14/17 SDR    Vibrio species NOT DETECTED NOT DETECTED Final   Vibrio cholerae NOT DETECTED NOT DETECTED Final   Enteroaggregative E coli (EAEC) NOT DETECTED NOT DETECTED Final   Enteropathogenic E coli (EPEC) NOT DETECTED NOT DETECTED Final   Enterotoxigenic E coli (ETEC) NOT DETECTED NOT DETECTED Final   Shiga like toxin producing E coli (STEC) NOT DETECTED NOT DETECTED Final   Shigella/Enteroinvasive E coli (EIEC) NOT DETECTED NOT DETECTED Final   Cryptosporidium NOT DETECTED NOT DETECTED Final   Cyclospora cayetanensis NOT DETECTED NOT DETECTED Final   Entamoeba histolytica NOT DETECTED NOT DETECTED Final   Giardia lamblia NOT DETECTED NOT DETECTED Final   Adenovirus F40/41 NOT DETECTED NOT DETECTED Final   Astrovirus NOT DETECTED NOT DETECTED Final   Norovirus GI/GII NOT DETECTED NOT DETECTED Final   Rotavirus A NOT DETECTED NOT DETECTED Final   Sapovirus (I, II, IV, and V) NOT DETECTED NOT DETECTED Final    Radiology Reports Dg Chest 2 View  Result Date: 05/12/2017 CLINICAL DATA:  Fever and status changes. Coronary artery disease. CHF. Diabetic. Smoker. EXAM: CHEST  2 VIEW COMPARISON:  04/27/2017 FINDINGS: Both views are degraded by patient body habitus and AP portable frontal technique. Midline trachea. Mild cardiomegaly. Atherosclerosis in the transverse aorta. Suspect tiny bilateral pleural  effusions. No pneumothorax. Mild pulmonary venous congestion, accentuated by AP portable frontal technique. No well-defined lobar consolidation. IMPRESSION: Cardiomegaly with mild pulmonary venous congestion and probable small bilateral pleural effusions. Aortic Atherosclerosis (ICD10-I70.0). Decreased sensitivity and specificity exam due to technique related factors, as described above. Electronically Signed   By: Abigail Miyamoto M.D.   On: 05/12/2017 15:46   Ct Head Wo Contrast  Result Date: 04/27/2017 CLINICAL DATA:  Fever and altered level of consciousness. EXAM: CT HEAD WITHOUT CONTRAST TECHNIQUE: Contiguous axial images were obtained from the base of the skull through the vertex without intravenous contrast. COMPARISON:  02/01/2017 FINDINGS: Brain: Stable chronic small vessel ischemia. Chronic focus of encephalomalacia involving the right occipital lobe. No large vascular territory infarct. No acute intracranial hemorrhage, mass or midline shift. No extra-axial fluid collections. Vascular: No hyperdense vessels. Moderate atherosclerosis of the cavernous sinus carotids and left vertebral arteries. Skull: Incomplete posterior arch of C1, developmental in appearance. Sinuses/Orbits: No acute finding. Mild right maxillary sinus atelectasis. Other: None IMPRESSION: 1. Chronic stable small vessel ischemic disease. 2. Chronic right occipital encephalomalacia. 3. No acute intracranial abnormality. Electronically Signed   By: Ashley Royalty M.D.   On: 04/27/2017 20:03   Ct Chest Wo Contrast  Result Date: 04/28/2017 CLINICAL DATA:  Possible pulmonary nodules. EXAM: CT CHEST WITHOUT CONTRAST TECHNIQUE: Multidetector CT imaging of the  chest was performed following the standard protocol without IV contrast. COMPARISON:  Radiograph of April 27, 2017. FINDINGS: Cardiovascular: 4.1 cm ascending thoracic aortic aneurysm is noted. Atherosclerosis of thoracic aorta is noted. Coronary artery calcifications are noted. Normal  cardiac size. No pericardial effusion is noted. Mediastinum/Nodes: Small sliding-type hiatal hernia is noted. Thyroid gland is unremarkable. No significant mediastinal adenopathy is noted. Lungs/Pleura: No pneumothorax or pleural effusion is noted. Emphysematous disease is noted in both upper lobes, predominantly on the right. No pulmonary nodules are noted. No acute pulmonary disease is noted. Upper Abdomen: Hepatic cirrhosis is noted. Mild splenomegaly is noted. Collateral veins are noted as well as paraesophageal varices. Cholelithiasis is noted. Musculoskeletal: No chest wall mass or suspicious bone lesions identified. IMPRESSION: 4.1 cm ascending thoracic aortic aneurysm. Recommend annual imaging followup by CTA or MRA. This recommendation follows 2010 ACCF/AHA/AATS/ACR/ASA/SCA/SCAI/SIR/STS/SVM Guidelines for the Diagnosis and Management of Patients with Thoracic Aortic Disease. Circulation. 2010; 121: M426-S341. No definite pulmonary nodules are noted. Coronary artery calcifications are noted suggesting coronary artery disease. Small sliding-type hiatal hernia is noted. Hepatic cirrhosis is noted with mild splenomegaly suggesting portal hypertension. Paraesophageal varices are noted as well. Aortic Atherosclerosis (ICD10-I70.0) and Emphysema (ICD10-J43.9). Electronically Signed   By: Marijo Conception, M.D.   On: 04/28/2017 11:46   Dg Chest Port 1 View  Result Date: 04/27/2017 CLINICAL DATA:  Fever EXAM: PORTABLE CHEST 1 VIEW COMPARISON:  03/02/2017 dating back to 01/31/2017 FINDINGS: The cardiopericardial silhouette is mildly enlarged, some which is likely due to portable technique and low lung volumes. The aorta is not aneurysmal. Low lung volumes without pneumonic consolidation, effusion or pneumothorax. Rounded densities about the right hilum likely reflect vessels seen on end and central vascular congestion. Pulmonary nodules are not entirely excluded but believed less likely. No pneumonic  consolidation. No effusion or pneumothorax. The visualized skeletal structures are unremarkable. IMPRESSION: 1. Low lung volumes with crowding of interstitial lung markings. Mild central vascular congestion. 2. Nodular densities about the right hilum are new and likely reflect pulmonary vessels seen on end. As pulmonary nodules are not entirely excluded, follow-up radiographs with PA and lateral views or chest CT may help exclude pulmonary nodules. Electronically Signed   By: Ashley Royalty M.D.   On: 04/27/2017 20:00   Ct Hematuria Workup  Result Date: 05/13/2017 CLINICAL DATA:  Patient with microscopic hematuria. EXAM: CT ABDOMEN AND PELVIS WITHOUT AND WITH CONTRAST TECHNIQUE: Multidetector CT imaging of the abdomen and pelvis was performed following the standard protocol before and following the bolus administration of intravenous contrast. CONTRAST:  125 cc Isovue 300 COMPARISON:  CT abdomen pelvis 03/02/2017. FINDINGS: Lower chest: Heart is enlarged. Dependent atelectasis within the bilateral lower lobes. Hepatobiliary: Liver is small and nodular in contour compatible with cirrhosis. Multiple stones within the gallbladder lumen. Mild gallbladder wall thickening and pericholecystic fluid. Pancreas: Unremarkable Spleen: Enlarged measuring 16.4 cm. Adrenals/Urinary Tract: Stable nodular thickening of the right adrenal gland. Left adrenal gland is normal. Noncontrast images demonstrate a 5 mm stone within the inferior pole left kidney (image 76; series 5). Persistent 1.5 cm partially exophytic cyst inferior pole right kidney. No suspicious enhancing renal masses are identified. Urinary bladder is unremarkable. Delayed images demonstrate excretion of contrast material into the bilateral renal collecting systems ureters and bladder. The mid and distal right ureter are not well opacified with contrast and therefore not adequately assessed. No filling defects identified within the opacified portions. Stomach/Bowel:  There is mild wall thickening of the small and large  bowel which is nonspecific in the setting of portal venous hypertension and moderate volume ascites. No free intraperitoneal air. No evidence for bowel obstruction. Vascular/Lymphatic: Normal caliber abdominal aorta. Peripheral calcified atherosclerotic plaque. Re- cannulization of the periumbilical vein and upper abdominal venous collateralization. Calcified plaque involving the superior mesenteric vein. Multiple esophageal varices are demonstrated. Reproductive: Prostate unremarkable. Other: Moderate volume ascites. Musculoskeletal: Lower thoracic and lumbar spine degenerative changes. No aggressive or acute appearing osseous lesions. IMPRESSION: 1. Nonobstructing stone inferior pole left kidney. No suspicious enhancing renal masses are identified. No filling defects identified within the opacified renal collecting systems and ureters. 2. Cirrhosis of the liver with portal venous hypertension, esophageal varices and splenomegaly. 3. Wall thickening of the small and large bowel which is likely secondary to cirrhosis and portal venous hypertension. Underlying enteritis is not excluded. 4. Moderate volume ascites. Electronically Signed   By: Lovey Newcomer M.D.   On: 05/13/2017 14:35    Lab Data:  CBC: Recent Labs  Lab 05/12/17 1502 05/13/17 0533  WBC 9.6 7.7  NEUTROABS 7.7  --   HGB 10.5* 10.1*  HCT 30.0* 29.6*  MCV 98.4 100.0  PLT 46* 30*   Basic Metabolic Panel: Recent Labs  Lab 05/12/17 1502 05/12/17 1609 05/13/17 0533  NA 134*  --  138  K 2.8*  --  3.6  CL 104  --  107  CO2 24  --  25  GLUCOSE 180*  --  107*  BUN 7  --  8  CREATININE 0.81  --  0.65  CALCIUM 7.1*  --  7.2*  MG  --  1.3*  --    GFR: Estimated Creatinine Clearance: 124.9 mL/min (by C-G formula based on SCr of 0.65 mg/dL). Liver Function Tests: Recent Labs  Lab 05/12/17 1502 05/13/17 0533  AST 66* 63*  ALT 39 34  ALKPHOS 125 94  BILITOT 2.4* 3.4*  PROT 6.0*  5.6*  ALBUMIN 2.1* 1.9*   No results for input(s): LIPASE, AMYLASE in the last 168 hours. Recent Labs  Lab 05/12/17 1552 05/13/17 0533  AMMONIA 80* 55*   Coagulation Profile: Recent Labs  Lab 05/12/17 1502  INR 1.76   Cardiac Enzymes: No results for input(s): CKTOTAL, CKMB, CKMBINDEX, TROPONINI in the last 168 hours. BNP (last 3 results) No results for input(s): PROBNP in the last 8760 hours. HbA1C: No results for input(s): HGBA1C in the last 72 hours. CBG: Recent Labs  Lab 05/13/17 1712 05/13/17 2105 05/14/17 0756 05/14/17 1134 05/14/17 1720  GLUCAP 277* 178* 84 197* 210*   Lipid Profile: No results for input(s): CHOL, HDL, LDLCALC, TRIG, CHOLHDL, LDLDIRECT in the last 72 hours. Thyroid Function Tests: No results for input(s): TSH, T4TOTAL, FREET4, T3FREE, THYROIDAB in the last 72 hours. Anemia Panel: No results for input(s): VITAMINB12, FOLATE, FERRITIN, TIBC, IRON, RETICCTPCT in the last 72 hours. Urine analysis:    Component Value Date/Time   COLORURINE AMBER (A) 05/12/2017 1735   APPEARANCEUR CLEAR 05/12/2017 1735   LABSPEC 1.018 05/12/2017 1735   PHURINE 6.0 05/12/2017 1735   GLUCOSEU NEGATIVE 05/12/2017 1735   HGBUR MODERATE (A) 05/12/2017 1735   BILIRUBINUR NEGATIVE 05/12/2017 1735   KETONESUR NEGATIVE 05/12/2017 1735   PROTEINUR NEGATIVE 05/12/2017 1735   UROBILINOGEN 0.2 05/14/2015 1813   NITRITE NEGATIVE 05/12/2017 1735   LEUKOCYTESUR NEGATIVE 05/12/2017 [place] M.D. [facility] 05/14/2017, 5:24 PM  Pager: [phone] Between 7am to 7pm - call Pager - [phone]  After 7pm go to [email or web address] -  password [email or web address]  Call night coverage person covering after 7pm

## 2017-05-14 NOTE — Progress Notes (Signed)
Nutrition Brief Note  Patient identified on the Malnutrition Screening Tool (MST) Report  Pt's weight is stable. PO intake: 100%  Wt Readings from Last 15 Encounters:  05/14/17 283 lb 9.6 oz (128.6 kg)  05/08/17 285 lb 3.2 oz (129.4 kg)  05/02/17 287 lb (130.2 kg)  04/30/17 274 lb 14.6 oz (124.7 kg)  04/06/17 276 lb (125.2 kg)  03/30/17 283 lb (128.4 kg)  03/22/17 268 lb (121.6 kg)  03/21/17 274 lb (124.3 kg)  03/07/17 283 lb 8 oz (128.6 kg)  03/03/17 286 lb 9.6 oz (130 kg)  02/07/17 285 lb (129.3 kg)  12/19/16 286 lb (129.7 kg)  11/17/16 286 lb (129.7 kg)  11/03/16 287 lb (130.2 kg)  08/04/16 286 lb 3.2 oz (129.8 kg)    Body mass index is 44.42 kg/m. Patient meets criteria for morbid obesity based on current BMI.   Current diet order is CHO modified, patient is consuming approximately 100% of meals at this time. Labs and medications reviewed.   No nutrition interventions warranted at this time. If nutrition issues arise, please consult RD.   Clayton Bibles, MS, RD, [place] [facility] Pager: [phone] After Hours Pager: [phone]

## 2017-05-14 NOTE — Progress Notes (Signed)
Pharmacy Antibiotic Note  Adam Butler is a 61 y.o. male with PMH of DM, liver cirrhosis secondary to NASH, esophageal varices, hx of Group B strep bacteremia admitted on 05/12/2017 with AMS, found to have elevated ammonia and fever. Patient initially placed on Vancomycin and Zosyn, subsequently narrowed to Ceftriaxone yesterday for Strep species in blood. Acinetobacter calcoaceticus/baumannii complex now growing in 1/2 sets of initial blood cultures, and antibiotics adjusted to Primaxin per ID recommendations. Repeat blood cultures ordered. Pharmacy has been consulted for Primaxin dosing.  Plan: Primaxin 542m IV q6h. Monitor renal function, cultures, clinical course.   Height: 5' 7"  (170.2 cm) Weight: 283 lb 9.6 oz (128.6 kg) IBW/kg (Calculated) : 66.1  Temp (24hrs), Avg:97.9 F (36.6 C), Min:97.6 F (36.4 C), Max:98.2 F (36.8 C)  Recent Labs  Lab 05/12/17 1502 05/12/17 1508 05/12/17 1705 05/13/17 0533  WBC 9.6  --   --  7.7  CREATININE 0.81  --   --  0.65  LATICACIDVEN  --  2.01* 1.34  --     Estimated Creatinine Clearance: 124.9 mL/min (by C-G formula based on SCr of 0.65 mg/dL).    Allergies  Allergen Reactions  . Isosorbide Nitrate Other (See Comments)    Nose bleeds    Antimicrobials this admission: 11/2 >> Vancomycin >> 11/3 11/2 >> Zosyn >> 11/3 11/3 >> Ceftriaxone >> 11/4 11/4 >> Primaxin >>  Dose adjustments this admission: --  Microbiology results: 11/2 BCx: 2/2 sets with Streptococcus gallolyticus. 1/2 sets with Acinetobacter calcoaceticus/baumannii complex 11/2 GI panel:  + Yersinia enterocolitica  11/2 Respiratory panel by PCR: negative 11/4 BCx: sent  Thank you for allowing pharmacy to be a part of this patient's care.   JLindell Spar PharmD, BCPS Pager: [phone]/10/2016 5:56 PM

## 2017-05-14 NOTE — Consult Note (Addendum)
[facility]  Total days of antibiotics 3               Reason for Consult: strep bacteremia    Referring Physician: [email or web address] Principal Problem:   Fever Active Problems:   Liver cirrhosis secondary to NASH (HCC)   Hypokalemia   Anemia   Diabetes mellitus type 2, uncontrolled, with complications (HCC)   Thrombocytopenia (HCC)    HPI: Adam Butler is a 61 y.o. male with DM,  NASH cirrhosis c/b eso and rectal varices, PSE, and hx of strep g bacteremia in august 2018 admitted on 11/2 for AMS found to have elevated ammonia with WBC at 9.6 with toxic granulation on smear, plt 30K. He denied having any abd pain. He was started on broad spectrum with vanco and piptazo. Infectious work up showed strep bacteremia and abtx changed to ceftriaxone. He was kept on rifaximin to help with encephalopathy. Source of bacteremia unclear. His abd CT on 11/3 shows moderated ascites.  Past Medical History:  Diagnosis Date  . Arthritis   . Cholelithiasis   . Cirrhosis ([place]) 2011   Cryptogenic, Likely NASH. Family/pt deny EtOH. HCV, HBV, HAV negative. ANA negative. AMA positive. Ascites 12/11  . Coronary artery disease    Inferior MI 12/11; LHC with occluded mid CFX and 80% proximal RCA. EF 55%. He had 3.0 x 28 vision BMS to CFX  . Diastolic CHF, acute ([place])    Echo 12/11 with ef 50-55% and mild LVH. EF 55% by LV0gram in 12/11  . Esophageal varices ([place]) 2011, 2013   no hx acute variceal bleed  . Hepatic encephalopathy ([place]) 2011, 12/2013  . Hyperlipemia   . Myocardial infarction [facility]) ? 2012  . PFO (patent foramen ovale): Per TEE 04/20/2015 04/20/2015  . Portal hypertension ([place])   . Rectal varices   . S/P coronary artery stent placement 06/2010  . SVT (supraventricular tachycardia) ([place])    1/12: appeared to be an ectopic atrial tachycardia. Required DCCV with hemodynamic instability  . Type II diabetes mellitus ([place]) 2011    Allergies:  Allergies  Allergen  Reactions  . Isosorbide Nitrate Other (See Comments)    Nose bleeds    MEDICATIONS: . aspirin EC  81 mg Oral Daily   And  . calcium carbonate  1 tablet Oral Daily  . atorvastatin  10 mg Oral Daily  . famotidine  10 mg Oral BID  . feeding supplement (PRO-STAT SUGAR FREE 64)  30 mL Oral BID  . insulin aspart  0-5 Units Subcutaneous QHS  . insulin aspart  0-9 Units Subcutaneous TID WC  . insulin glargine  8 Units Subcutaneous QHS  . lactulose  20 g Oral QHS  . multivitamin with minerals  1 tablet Oral Daily  . PARoxetine  10 mg Oral Daily  . rifaximin  550 mg Oral BID  . sodium chloride flush  3 mL Intravenous Q12H  . spironolactone  12.5 mg Oral Daily  . vitamin B-12  1,000 mcg Oral Daily    Social History   Tobacco Use  . Smoking status: Current Every Day Smoker    Packs/day: 0.25    Years: 36.00    Pack years: 9.00    Types: E-cigarettes, Cigarettes  . Smokeless tobacco: Never Used  . Tobacco comment: uses vapor cigarettes (2016 ); I smoke about 4 cigarettes a day  Substance Use Topics  . Alcohol use: No    Alcohol/week: 0.0 oz  . Drug  use: No    Family History  Problem Relation Age of Onset  . Heart attack Brother 34       MI  . Diabetes Brother   . Heart attack Father   . Diabetes Father   . COPD Father   . COPD Mother   . Heart attack Brother   . Stroke Neg Hx      Review of Systems  Constitutional: Negative for fever, chills, diaphoresis, activity change, appetite change, fatigue and unexpected weight change.  HENT: Negative for congestion, sore throat, rhinorrhea, sneezing, trouble swallowing and sinus pressure.  Eyes: Negative for photophobia and visual disturbance.  Respiratory: Negative for cough, chest tightness, shortness of breath, wheezing and stridor.  Cardiovascular: Negative for chest pain, palpitations and leg swelling.  Gastrointestinal: + loose stools when he takes lactulose, Negative for nausea, vomiting, abdominal pain, diarrhea,  constipation, blood in stool, abdominal distention and anal bleeding.  Genitourinary: Negative for dysuria, hematuria, flank pain and difficulty urinating.  Musculoskeletal: Negative for myalgias, back pain, joint swelling, arthralgias and gait problem.  Skin: Negative for color change, pallor, rash and wound.  Neurological: Negative for dizziness, tremors, weakness and light-headedness.  Hematological: Negative for adenopathy. Does not bruise/bleed easily.  Psychiatric/Behavioral: + confusion, Negative for behavioral problems, confusion, sleep disturbance, dysphoric mood, decreased concentration and agitation.     OBJECTIVE: Temp:  [97.8 F (36.6 C)-98.2 F (36.8 C)] 98.2 F (36.8 C) (11/04 0607) Pulse Rate:  [67-72] 68 (11/04 0607) Resp:  [18-20] 20 (11/04 0607) BP: (104-112)/(49-52) 104/49 (11/04 0607) SpO2:  [97 %-99 %] 97 % (11/04 0607) Weight:  [283 lb 9.6 oz (128.6 kg)] 283 lb 9.6 oz (128.6 kg) (11/04 8250) Physical Exam  Constitutional: He is oriented to person, place, and time. He appears well-developed and well-nourished. No distress.  HENT:  Mouth/Throat: Oropharynx is clear and moist. No oropharyngeal exudate.  Cardiovascular: Normal rate, regular rhythm and normal heart sounds. Exam reveals no gallop and no friction rub.  No murmur heard.  Pulmonary/Chest: Effort normal and breath sounds normal. No respiratory distress. He has no wheezes.  Abdominal: Soft. Bowel sounds are normal. He exhibits no distension. There is no tenderness. Protuberant abdomen,  Lymphadenopathy:  He has no cervical adenopathy.  Neurological: He is alert and oriented to person, place, and time.  Skin: Skin is warm and dry. No rash noted. No erythema.  Psychiatric: He has a normal mood and affect. His behavior is normal.     LABS: Results for orders placed or performed during the hospital encounter of 05/12/17 (from the past 48 hour(s))  CBG monitoring, ED     Status: Abnormal   Collection  Time: 05/12/17  2:52 PM  Result Value Ref Range   Glucose-Capillary 172 (H) 65 - 99 mg/dL  Comprehensive metabolic panel     Status: Abnormal   Collection Time: 05/12/17  3:02 PM  Result Value Ref Range   Sodium 134 (L) 135 - 145 mmol/L   Potassium 2.8 (L) 3.5 - 5.1 mmol/L   Chloride 104 101 - 111 mmol/L   CO2 24 22 - 32 mmol/L   Glucose, Bld 180 (H) 65 - 99 mg/dL   BUN 7 6 - 20 mg/dL   Creatinine, Ser 0.81 0.61 - 1.24 mg/dL   Calcium 7.1 (L) 8.9 - 10.3 mg/dL   Total Protein 6.0 (L) 6.5 - 8.1 g/dL   Albumin 2.1 (L) 3.5 - 5.0 g/dL   AST 66 (H) 15 - 41 U/L   ALT 39  17 - 63 U/L   Alkaline Phosphatase 125 38 - 126 U/L   Total Bilirubin 2.4 (H) 0.3 - 1.2 mg/dL   GFR calc non Af Amer >60 >60 mL/min   GFR calc Af Amer >60 >60 mL/min    Comment: (NOTE) The eGFR has been calculated using the CKD EPI equation. This calculation has not been validated in all clinical situations. eGFR's persistently <60 mL/min signify possible Chronic Kidney Disease.    Anion gap 6 5 - 15  CBC with Differential     Status: Abnormal   Collection Time: 05/12/17  3:02 PM  Result Value Ref Range   WBC 9.6 4.0 - 10.5 K/uL   RBC 3.05 (L) 4.22 - 5.81 MIL/uL   Hemoglobin 10.5 (L) 13.0 - 17.0 g/dL   HCT 30.0 (L) 39.0 - 52.0 %   MCV 98.4 78.0 - 100.0 fL   MCH 34.4 (H) 26.0 - 34.0 pg   MCHC 35.0 30.0 - 36.0 g/dL   RDW 14.9 11.5 - 15.5 %   Platelets 46 (L) 150 - 400 K/uL    Comment: SPECIMEN CHECKED FOR CLOTS REPEATED TO VERIFY PLATELET COUNT CONFIRMED BY SMEAR    Neutrophils Relative % 81 %   Lymphocytes Relative 8 %   Monocytes Relative 11 %   Eosinophils Relative 0 %   Basophils Relative 0 %   Neutro Abs 7.7 1.7 - 7.7 K/uL   Lymphs Abs 0.8 0.7 - 4.0 K/uL   Monocytes Absolute 1.1 (H) 0.1 - 1.0 K/uL   Eosinophils Absolute 0.0 0.0 - 0.7 K/uL   Basophils Absolute 0.0 0.0 - 0.1 K/uL   WBC Morphology TOXIC GRANULATION     Comment: VACUOLATED NEUTROPHILS  Protime-INR     Status: Abnormal   Collection  Time: 05/12/17  3:02 PM  Result Value Ref Range   Prothrombin Time 20.4 (H) 11.4 - 15.2 seconds   INR 1.76   Culture, blood (Routine x 2)     Status: None (Preliminary result)   Collection Time: 05/12/17  3:03 PM  Result Value Ref Range   Specimen Description BLOOD RIGHT HAND    Special Requests      BOTTLES DRAWN AEROBIC AND ANAEROBIC Blood Culture adequate volume   Culture  Setup Time      GRAM POSITIVE COCCI IN PAIRS AND CHAINS IN BOTH AEROBIC AND ANAEROBIC BOTTLES CRITICAL RESULT CALLED TO, READ BACK BY AND VERIFIED WITH: N GLOGOVAC,PHARMD AT 0725 05/13/17 BY L BENFIELD Performed at [facility], [address] [place], [place] [address]    Culture GRAM POSITIVE COCCI    Report Status PENDING   Blood Culture ID Panel (Reflexed)     Status: Abnormal   Collection Time: 05/12/17  3:03 PM  Result Value Ref Range   Enterococcus species NOT DETECTED NOT DETECTED   Listeria monocytogenes NOT DETECTED NOT DETECTED   Staphylococcus species NOT DETECTED NOT DETECTED   Staphylococcus aureus NOT DETECTED NOT DETECTED   Streptococcus species DETECTED (A) NOT DETECTED    Comment: Not Enterococcus species, Streptococcus agalactiae, Streptococcus pyogenes, or Streptococcus pneumoniae. CRITICAL RESULT CALLED TO, READ BACK BY AND VERIFIED WITH: N GLOGOVAC,PHARMD AT 0725 05/13/17 BY L BENFIELD    Streptococcus agalactiae NOT DETECTED NOT DETECTED   Streptococcus pneumoniae NOT DETECTED NOT DETECTED   Streptococcus pyogenes NOT DETECTED NOT DETECTED   Acinetobacter baumannii NOT DETECTED NOT DETECTED   Enterobacteriaceae species NOT DETECTED NOT DETECTED   Enterobacter cloacae complex NOT DETECTED NOT DETECTED  Escherichia coli NOT DETECTED NOT DETECTED   Klebsiella oxytoca NOT DETECTED NOT DETECTED   Klebsiella pneumoniae NOT DETECTED NOT DETECTED   Proteus species NOT DETECTED NOT DETECTED   Serratia marcescens NOT DETECTED NOT DETECTED   Haemophilus influenzae NOT DETECTED NOT  DETECTED   Neisseria meningitidis NOT DETECTED NOT DETECTED   Pseudomonas aeruginosa NOT DETECTED NOT DETECTED   Candida albicans NOT DETECTED NOT DETECTED   Candida glabrata NOT DETECTED NOT DETECTED   Candida krusei NOT DETECTED NOT DETECTED   Candida parapsilosis NOT DETECTED NOT DETECTED   Candida tropicalis NOT DETECTED NOT DETECTED    Comment: Performed at [facility], [place] [address] [place], [place] [address]  I-Stat CG4 Lactic Acid, ED     Status: Abnormal   Collection Time: 05/12/17  3:08 PM  Result Value Ref Range   Lactic Acid, Venous 2.01 (HH) 0.5 - 1.9 mmol/L   Comment NOTIFIED PHYSICIAN   Ammonia     Status: Abnormal   Collection Time: 05/12/17  3:52 PM  Result Value Ref Range   Ammonia 80 (H) 9 - 35 umol/L  Culture, blood (Routine x 2)     Status: None (Preliminary result)   Collection Time: 05/12/17  3:55 PM  Result Value Ref Range   Specimen Description BLOOD LEFT HAND    Special Requests      BOTTLES DRAWN AEROBIC AND ANAEROBIC Blood Culture adequate volume   Culture  Setup Time      GRAM POSITIVE COCCI IN PAIRS AND CHAINS IN BOTH AEROBIC AND ANAEROBIC BOTTLES CRITICAL VALUE NOTED.  VALUE IS CONSISTENT WITH PREVIOUSLY REPORTED AND CALLED VALUE.    Culture      GRAM POSITIVE COCCI GRAM NEGATIVE RODS CRITICAL RESULT CALLED TO, READ BACK BY AND VERIFIED WITH: J GADHIA,PHARMD AT 1010 05/14/17 BY L BENFIELD CONCERNING GROWTH ON CULTURE Performed at [facility], [place] [address], [place], [place] [address]    Report Status PENDING   Magnesium     Status: Abnormal   Collection Time: 05/12/17  4:09 PM  Result Value Ref Range   Magnesium 1.3 (L) 1.7 - 2.4 mg/dL  I-Stat CG4 Lactic Acid, ED     Status: None   Collection Time: 05/12/17  5:05 PM  Result Value Ref Range   Lactic Acid, Venous 1.34 0.5 - 1.9 mmol/L  Urinalysis, Routine w reflex microscopic     Status: Abnormal   Collection Time: 05/12/17  5:35 PM  Result Value Ref Range   Color, Urine  AMBER (A) YELLOW    Comment: BIOCHEMICALS MAY BE AFFECTED BY COLOR   APPearance CLEAR CLEAR   Specific Gravity, Urine 1.018 1.005 - 1.030   pH 6.0 5.0 - 8.0   Glucose, UA NEGATIVE NEGATIVE mg/dL   Hgb urine dipstick MODERATE (A) NEGATIVE   Bilirubin Urine NEGATIVE NEGATIVE   Ketones, ur NEGATIVE NEGATIVE mg/dL   Protein, ur NEGATIVE NEGATIVE mg/dL   Nitrite NEGATIVE NEGATIVE   Leukocytes, UA NEGATIVE NEGATIVE   RBC / HPF TOO NUMEROUS TO COUNT 0 - 5 RBC/hpf   WBC, UA 0-5 0 - 5 WBC/hpf   Bacteria, UA NONE SEEN NONE SEEN   Squamous Epithelial / LPF NONE SEEN NONE SEEN   Mucus PRESENT   Gastrointestinal Panel by PCR , Stool     Status: Abnormal   Collection Time: 05/12/17  7:58 PM  Result Value Ref Range   Campylobacter species NOT DETECTED NOT DETECTED   Plesimonas shigelloides NOT DETECTED NOT  DETECTED   Salmonella species NOT DETECTED NOT DETECTED   Yersinia enterocolitica DETECTED (A) NOT DETECTED    Comment: RESULT CALLED TO, READ BACK BY AND VERIFIED WITH:  Randa Lynn AT 4163 05/14/17 SDR    Vibrio species NOT DETECTED NOT DETECTED   Vibrio cholerae NOT DETECTED NOT DETECTED   Enteroaggregative E coli (EAEC) NOT DETECTED NOT DETECTED   Enteropathogenic E coli (EPEC) NOT DETECTED NOT DETECTED   Enterotoxigenic E coli (ETEC) NOT DETECTED NOT DETECTED   Shiga like toxin producing E coli (STEC) NOT DETECTED NOT DETECTED   Shigella/Enteroinvasive E coli (EIEC) NOT DETECTED NOT DETECTED   Cryptosporidium NOT DETECTED NOT DETECTED   Cyclospora cayetanensis NOT DETECTED NOT DETECTED   Entamoeba histolytica NOT DETECTED NOT DETECTED   Giardia lamblia NOT DETECTED NOT DETECTED   Adenovirus F40/41 NOT DETECTED NOT DETECTED   Astrovirus NOT DETECTED NOT DETECTED   Norovirus GI/GII NOT DETECTED NOT DETECTED   Rotavirus A NOT DETECTED NOT DETECTED   Sapovirus (I, II, IV, and V) NOT DETECTED NOT DETECTED  Glucose, capillary     Status: Abnormal   Collection Time: 05/12/17 11:45 PM   Result Value Ref Range   Glucose-Capillary 120 (H) 65 - 99 mg/dL   Comment 1 Notify RN   Ammonia     Status: Abnormal   Collection Time: 05/13/17  5:33 AM  Result Value Ref Range   Ammonia 55 (H) 9 - 35 umol/L  CBC     Status: Abnormal   Collection Time: 05/13/17  5:33 AM  Result Value Ref Range   WBC 7.7 4.0 - 10.5 K/uL   RBC 2.96 (L) 4.22 - 5.81 MIL/uL   Hemoglobin 10.1 (L) 13.0 - 17.0 g/dL   HCT 29.6 (L) 39.0 - 52.0 %   MCV 100.0 78.0 - 100.0 fL   MCH 34.1 (H) 26.0 - 34.0 pg   MCHC 34.1 30.0 - 36.0 g/dL   RDW 15.3 11.5 - 15.5 %   Platelets 30 (L) 150 - 400 K/uL    Comment: CONSISTENT WITH PREVIOUS RESULT  Comprehensive metabolic panel     Status: Abnormal   Collection Time: 05/13/17  5:33 AM  Result Value Ref Range   Sodium 138 135 - 145 mmol/L   Potassium 3.6 3.5 - 5.1 mmol/L    Comment: DELTA CHECK NOTED SLIGHT HEMOLYSIS    Chloride 107 101 - 111 mmol/L   CO2 25 22 - 32 mmol/L   Glucose, Bld 107 (H) 65 - 99 mg/dL   BUN 8 6 - 20 mg/dL   Creatinine, Ser 0.65 0.61 - 1.24 mg/dL   Calcium 7.2 (L) 8.9 - 10.3 mg/dL   Total Protein 5.6 (L) 6.5 - 8.1 g/dL   Albumin 1.9 (L) 3.5 - 5.0 g/dL   AST 63 (H) 15 - 41 U/L   ALT 34 17 - 63 U/L   Alkaline Phosphatase 94 38 - 126 U/L   Total Bilirubin 3.4 (H) 0.3 - 1.2 mg/dL   GFR calc non Af Amer >60 >60 mL/min   GFR calc Af Amer >60 >60 mL/min    Comment: (NOTE) The eGFR has been calculated using the CKD EPI equation. This calculation has not been validated in all clinical situations. eGFR's persistently <60 mL/min signify possible Chronic Kidney Disease.    Anion gap 6 5 - 15  Glucose, capillary     Status: Abnormal   Collection Time: 05/13/17 12:06 PM  Result Value Ref Range   Glucose-Capillary 125 (H) 65 -  99 mg/dL  Glucose, capillary     Status: Abnormal   Collection Time: 05/13/17  5:12 PM  Result Value Ref Range   Glucose-Capillary 277 (H) 65 - 99 mg/dL   Comment 1 Notify RN    Comment 2 Document in Chart     Glucose, capillary     Status: Abnormal   Collection Time: 05/13/17  9:05 PM  Result Value Ref Range   Glucose-Capillary 178 (H) 65 - 99 mg/dL  Glucose, capillary     Status: None   Collection Time: 05/14/17  7:56 AM  Result Value Ref Range   Glucose-Capillary 84 65 - 99 mg/dL  Glucose, capillary     Status: Abnormal   Collection Time: 05/14/17 11:34 AM  Result Value Ref Range   Glucose-Capillary 197 (H) 65 - 99 mg/dL    MICRO: 11/2 blood cx -strep gallolyticus  And 1 set with strep gallolyticus plus acinetobacter IMAGING: Dg Chest 2 View  Result Date: 05/12/2017 CLINICAL DATA:  Fever and status changes. Coronary artery disease. CHF. Diabetic. Smoker. EXAM: CHEST  2 VIEW COMPARISON:  04/27/2017 FINDINGS: Both views are degraded by patient body habitus and AP portable frontal technique. Midline trachea. Mild cardiomegaly. Atherosclerosis in the transverse aorta. Suspect tiny bilateral pleural effusions. No pneumothorax. Mild pulmonary venous congestion, accentuated by AP portable frontal technique. No well-defined lobar consolidation. IMPRESSION: Cardiomegaly with mild pulmonary venous congestion and probable small bilateral pleural effusions. Aortic Atherosclerosis (ICD10-I70.0). Decreased sensitivity and specificity exam due to technique related factors, as described above. Electronically Signed   By: Abigail Miyamoto M.D.   On: 05/12/2017 15:46   Ct Hematuria Workup  Result Date: 05/13/2017 CLINICAL DATA:  Patient with microscopic hematuria. EXAM: CT ABDOMEN AND PELVIS WITHOUT AND WITH CONTRAST TECHNIQUE: Multidetector CT imaging of the abdomen and pelvis was performed following the standard protocol before and following the bolus administration of intravenous contrast. CONTRAST:  125 cc Isovue 300 COMPARISON:  CT abdomen pelvis 03/02/2017. FINDINGS: Lower chest: Heart is enlarged. Dependent atelectasis within the bilateral lower lobes. Hepatobiliary: Liver is small and nodular in contour  compatible with cirrhosis. Multiple stones within the gallbladder lumen. Mild gallbladder wall thickening and pericholecystic fluid. Pancreas: Unremarkable Spleen: Enlarged measuring 16.4 cm. Adrenals/Urinary Tract: Stable nodular thickening of the right adrenal gland. Left adrenal gland is normal. Noncontrast images demonstrate a 5 mm stone within the inferior pole left kidney (image 76; series 5). Persistent 1.5 cm partially exophytic cyst inferior pole right kidney. No suspicious enhancing renal masses are identified. Urinary bladder is unremarkable. Delayed images demonstrate excretion of contrast material into the bilateral renal collecting systems ureters and bladder. The mid and distal right ureter are not well opacified with contrast and therefore not adequately assessed. No filling defects identified within the opacified portions. Stomach/Bowel: There is mild wall thickening of the small and large bowel which is nonspecific in the setting of portal venous hypertension and moderate volume ascites. No free intraperitoneal air. No evidence for bowel obstruction. Vascular/Lymphatic: Normal caliber abdominal aorta. Peripheral calcified atherosclerotic plaque. Re- cannulization of the periumbilical vein and upper abdominal venous collateralization. Calcified plaque involving the superior mesenteric vein. Multiple esophageal varices are demonstrated. Reproductive: Prostate unremarkable. Other: Moderate volume ascites. Musculoskeletal: Lower thoracic and lumbar spine degenerative changes. No aggressive or acute appearing osseous lesions. IMPRESSION: 1. Nonobstructing stone inferior pole left kidney. No suspicious enhancing renal masses are identified. No filling defects identified within the opacified renal collecting systems and ureters. 2. Cirrhosis of the liver with portal venous  hypertension, esophageal varices and splenomegaly. 3. Wall thickening of the small and large bowel which is likely secondary to  cirrhosis and portal venous hypertension. Underlying enteritis is not excluded. 4. Moderate volume ascites. Electronically Signed   By: Lovey Newcomer M.D.   On: 05/13/2017 14:35    Assessment/Plan: 61yo M with NASH cirrhosis presented with hepatic encephalopathy in the setting of strep gallolyticus +/- acinetobacter baumanii bacteremia. Likely GI source - recommend repeat blood cx to see he has cleared his bacteremia - recommend to get ascites fluid sampled by IR be it a diagnostic or therapeutic tap, to se if he has SBP as the source of his infection - strep gallolyticus associated with endocarditis and remote association with colon ca.  Recommend to get TTE. Recommend to get o/p colonoscopy - await sensitivities of acinetobacter. In the meantime, will switch to imipenem. Coordinated with pharmacy to change his abtx  Hepatic encephalopathy = continue with lactulose  Diarrhea = gi pathogen panel shows yersinia - I did not question whether he ate undercooked pork. Unclear if it is low level, it was not identified in his blood cx. Usually responds to cephalosporins  Dr Tommy Medal to provide further recs  Patient would like to be discharged by Tuesday so that he can vote!

## 2017-05-14 NOTE — Progress Notes (Signed)
[facility]                                                                              Patient Demographics  Adam Butler, is a 61 y.o. male, DOB - 11/11/1955, YKD:[id number]  Admit date - 05/12/2017   Admitting Physician Jani Gravel, MD  Outpatient Primary MD for the patient is [name], NP  Outpatient specialists:   LOS - 2  days    Chief Complaint  Patient presents with  . Blood Infection       Brief summary  Adam Butler  is a 61 y.o. male, with past medical history significant for but not limited to dm2, hypertension, hyperlpidemia, CAD, CHF, hepatic cirrhosis ([place]),with esophageal varices,/ prior hepatic encephalopathy presenting with lethargic, fever and slight dry cough as well as diarrhea (on lactulose).  Chest x-ray was negative for acute infiltrate, cardiomegaly with probable small bilateral pleural effusion noted, labs elevated ammonia 80, up from baseline of about 40, no leukocytosis.     Assessment & Plan    Principal Problem:   Fever Active Problems:   Liver cirrhosis secondary to NASH (HCC)   Hypokalemia   Anemia   Diabetes mellitus type 2, uncontrolled, with complications (HCC)   Thrombocytopenia (HCC)  #1 strep bacteremia: Blood culture x2 pending reports 05/13/2017+ for strep Discontinued Vanco and Zosyn and narrowed  IV antibiotic to ceftriaxone yesterday Follow-up full culture results Dr Baxter Flattery, ID consulted  #2 encephalopathy: resolved Supportive care Continue lactulose with Aldactone and rifaximin Trend ammonia levels-improvement  #3 thrombus cytopenia with anemia: Not new Follow clinically  #4 microscopic hematuria: UA cleared  #5 diabetes mellitus: Glycemic control    Code Status: Full code DVT Prophylaxis: SCD's Family Communication: Discussed in detail with the patient, all imaging results, lab results explained to the patient    Disposition Plan: To be determined  Time Spent in minutes  45  minutes  Procedures:    Consultants:     Antimicrobials:      Medications  Scheduled Meds: . aspirin EC  81 mg Oral Daily   And  . calcium carbonate  1 tablet Oral Daily  . atorvastatin  10 mg Oral Daily  . famotidine  10 mg Oral BID  . feeding supplement (ENSURE ENLIVE)  237 mL Oral BID BM  . feeding supplement (PRO-STAT SUGAR FREE 64)  30 mL Oral BID  . insulin aspart  0-5 Units Subcutaneous QHS  . insulin aspart  0-9 Units Subcutaneous TID WC  . insulin glargine  8 Units Subcutaneous QHS  . lactulose  20 g Oral QHS  . multivitamin with minerals  1 tablet Oral Daily  . PARoxetine  10 mg Oral Daily  . rifaximin  550 mg Oral BID  . sodium chloride flush  3 mL Intravenous Q12H  . spironolactone  12.5 mg Oral Daily  . vitamin B-12  1,000 mcg Oral Daily   Continuous Infusions: . sodium chloride    . cefTRIAXone (ROCEPHIN)  IV Stopped (05/13/17 1224)   PRN Meds:.sodium chloride, acetaminophen **OR** acetaminophen, fluticasone, nitroGLYCERIN, sodium chloride flush   Antibiotics   Anti-infectives (From admission, onward)  Start     Dose/Rate Route Frequency Ordered Stop   05/13/17 1000  cefTRIAXone (ROCEPHIN) 2 g in dextrose 5 % 50 mL IVPB     2 g 100 mL/hr over 30 Minutes Intravenous Every 24 hours 05/13/17 0851     05/13/17 0300  piperacillin-tazobactam (ZOSYN) IVPB 3.375 g  Status:  Discontinued     3.375 g 12.5 mL/hr over 240 Minutes Intravenous Every 8 hours 05/13/17 0246 05/13/17 0844   05/12/17 2359  vancomycin (VANCOCIN) 1,250 mg in sodium chloride 0.9 % 250 mL IVPB  Status:  Discontinued     1,250 mg 166.7 mL/hr over 90 Minutes Intravenous Every 12 hours 05/12/17 2150 05/13/17 0844   05/12/17 2200  rifaximin (XIFAXAN) tablet 550 mg     550 mg Oral 2 times daily 05/12/17 1951     05/12/17 1600  piperacillin-tazobactam (ZOSYN) IVPB 3.375 g     3.375 g 100 mL/hr over 30 Minutes Intravenous  Once 05/12/17 1547 05/12/17 1623   05/12/17 1600  vancomycin  (VANCOCIN) IVPB 1000 mg/200 mL premix     1,000 mg 200 mL/hr over 60 Minutes Intravenous  Once 05/12/17 1547 05/12/17 1738        Subjective:   Adam Butler was seen and examined today.  H&P by admitting physician reviewed noted.  No fever or chills.  No abdominal pain. Objective:   Vitals:   05/13/17 0507 05/13/17 1433 05/13/17 2107 05/14/17 0607  BP: 100/75 (!) 112/49 (!) 112/52 (!) 104/49  Pulse: 75 72 67 68  Resp: 20 20 18 20   Temp: 98 F (36.7 C) 97.8 F (36.6 C) 98 F (36.7 C) 98.2 F (36.8 C)  TempSrc: Oral Oral Oral Oral  SpO2: 98% 98% 99% 97%  Weight:    128.6 kg (283 lb 9.6 oz)  Height:        Intake/Output Summary (Last 24 hours) at 05/14/2017 0944 Last data filed at 05/14/2017 0600 Gross per 24 hour  Intake 770 ml  Output -  Net 770 ml     Wt Readings from Last 3 Encounters:  05/14/17 128.6 kg (283 lb 9.6 oz)  05/08/17 129.4 kg (285 lb 3.2 oz)  05/02/17 130.2 kg (287 lb)     Exam  General: NAD  HEENT: NCAT,  PERRL,MMM  Neck: SUPPLE, (-) JVD  Cardiovascular: RRR, (-) GALLOP, (-) MURMUR  Respiratory: CTA  Gastrointestinal: SOFT, (-) DISTENSION, BS(+), (_) TENDERNESS  Ext: (-) CYANOSIS, (-) EDEMA  Neuro: A, OX 3  Skin:(-) RASH  Psych:NORMAL AFFECT/MOOD   Data Reviewed:  I have personally reviewed following labs and imaging studies  Micro Results Recent Results (from the past 240 hour(s))  Culture, blood (Routine x 2)     Status: None (Preliminary result)   Collection Time: 05/12/17  3:03 PM  Result Value Ref Range Status   Specimen Description BLOOD RIGHT HAND  Final   Special Requests   Final    BOTTLES DRAWN AEROBIC AND ANAEROBIC Blood Culture adequate volume   Culture  Setup Time   Final    GRAM POSITIVE COCCI IN PAIRS AND CHAINS IN BOTH AEROBIC AND ANAEROBIC BOTTLES CRITICAL RESULT CALLED TO, READ BACK BY AND VERIFIED WITH: N GLOGOVAC,PHARMD AT 0725 05/13/17 BY L BENFIELD Performed at [facility], [address] [place], [place] [address]    Culture [facility] POSITIVE COCCI  Final   Report Status PENDING  Incomplete  Blood Culture ID Panel (Reflexed)     Status: Abnormal  Collection Time: 05/12/17  3:03 PM  Result Value Ref Range Status   Enterococcus species NOT DETECTED NOT DETECTED Final   Listeria monocytogenes NOT DETECTED NOT DETECTED Final   Staphylococcus species NOT DETECTED NOT DETECTED Final   Staphylococcus aureus NOT DETECTED NOT DETECTED Final   Streptococcus species DETECTED (A) NOT DETECTED Final    Comment: Not Enterococcus species, Streptococcus agalactiae, Streptococcus pyogenes, or Streptococcus pneumoniae. CRITICAL RESULT CALLED TO, READ BACK BY AND VERIFIED WITH: N GLOGOVAC,PHARMD AT 0725 05/13/17 BY L BENFIELD    Streptococcus agalactiae NOT DETECTED NOT DETECTED Final   Streptococcus pneumoniae NOT DETECTED NOT DETECTED Final   Streptococcus pyogenes NOT DETECTED NOT DETECTED Final   Acinetobacter baumannii NOT DETECTED NOT DETECTED Final   Enterobacteriaceae species NOT DETECTED NOT DETECTED Final   Enterobacter cloacae complex NOT DETECTED NOT DETECTED Final   Escherichia coli NOT DETECTED NOT DETECTED Final   Klebsiella oxytoca NOT DETECTED NOT DETECTED Final   Klebsiella pneumoniae NOT DETECTED NOT DETECTED Final   Proteus species NOT DETECTED NOT DETECTED Final   Serratia marcescens NOT DETECTED NOT DETECTED Final   Haemophilus influenzae NOT DETECTED NOT DETECTED Final   Neisseria meningitidis NOT DETECTED NOT DETECTED Final   Pseudomonas aeruginosa NOT DETECTED NOT DETECTED Final   Candida albicans NOT DETECTED NOT DETECTED Final   Candida glabrata NOT DETECTED NOT DETECTED Final   Candida krusei NOT DETECTED NOT DETECTED Final   Candida parapsilosis NOT DETECTED NOT DETECTED Final   Candida tropicalis NOT DETECTED NOT DETECTED Final    Comment: Performed at [facility], [place]. [address], [place], [place] [address]  Culture, blood (Routine x 2)     Status:  None (Preliminary result)   Collection Time: 05/12/17  3:55 PM  Result Value Ref Range Status   Specimen Description BLOOD LEFT HAND  Final   Special Requests   Final    BOTTLES DRAWN AEROBIC AND ANAEROBIC Blood Culture adequate volume   Culture  Setup Time   Final    GRAM POSITIVE COCCI IN PAIRS AND CHAINS IN BOTH AEROBIC AND ANAEROBIC BOTTLES CRITICAL VALUE NOTED.  VALUE IS CONSISTENT WITH PREVIOUSLY REPORTED AND CALLED VALUE. Performed at [facility], [place] [address] [place], [place] [address]    Culture GRAM POSITIVE COCCI  Final   Report Status PENDING  Incomplete  Gastrointestinal Panel by PCR , Stool     Status: Abnormal   Collection Time: 05/12/17  7:58 PM  Result Value Ref Range Status   Campylobacter species NOT DETECTED NOT DETECTED Final   Plesimonas shigelloides NOT DETECTED NOT DETECTED Final   Salmonella species NOT DETECTED NOT DETECTED Final   Yersinia enterocolitica DETECTED (A) NOT DETECTED Final    Comment: RESULT CALLED TO, READ BACK BY AND VERIFIED WITH:  Randa Lynn AT 0624 05/14/17 SDR    Vibrio species NOT DETECTED NOT DETECTED Final   Vibrio cholerae NOT DETECTED NOT DETECTED Final   Enteroaggregative E coli (EAEC) NOT DETECTED NOT DETECTED Final   Enteropathogenic E coli (EPEC) NOT DETECTED NOT DETECTED Final   Enterotoxigenic E coli (ETEC) NOT DETECTED NOT DETECTED Final   Shiga like toxin producing E coli (STEC) NOT DETECTED NOT DETECTED Final   Shigella/Enteroinvasive E coli (EIEC) NOT DETECTED NOT DETECTED Final   Cryptosporidium NOT DETECTED NOT DETECTED Final   Cyclospora cayetanensis NOT DETECTED NOT DETECTED Final   Entamoeba histolytica NOT DETECTED NOT DETECTED Final   Giardia lamblia NOT DETECTED NOT DETECTED Final  Adenovirus F40/41 NOT DETECTED NOT DETECTED Final   Astrovirus NOT DETECTED NOT DETECTED Final   Norovirus GI/GII NOT DETECTED NOT DETECTED Final   Rotavirus A NOT DETECTED NOT DETECTED Final   Sapovirus (I, II, IV,  and V) NOT DETECTED NOT DETECTED Final    Radiology Reports Dg Chest 2 View  Result Date: 05/12/2017 CLINICAL DATA:  Fever and status changes. Coronary artery disease. CHF. Diabetic. Smoker. EXAM: CHEST  2 VIEW COMPARISON:  04/27/2017 FINDINGS: Both views are degraded by patient body habitus and AP portable frontal technique. Midline trachea. Mild cardiomegaly. Atherosclerosis in the transverse aorta. Suspect tiny bilateral pleural effusions. No pneumothorax. Mild pulmonary venous congestion, accentuated by AP portable frontal technique. No well-defined lobar consolidation. IMPRESSION: Cardiomegaly with mild pulmonary venous congestion and probable small bilateral pleural effusions. Aortic Atherosclerosis (ICD10-I70.0). Decreased sensitivity and specificity exam due to technique related factors, as described above. Electronically Signed   By: Abigail Miyamoto M.D.   On: 05/12/2017 15:46   Ct Head Wo Contrast  Result Date: 04/27/2017 CLINICAL DATA:  Fever and altered level of consciousness. EXAM: CT HEAD WITHOUT CONTRAST TECHNIQUE: Contiguous axial images were obtained from the base of the skull through the vertex without intravenous contrast. COMPARISON:  02/01/2017 FINDINGS: Brain: Stable chronic small vessel ischemia. Chronic focus of encephalomalacia involving the right occipital lobe. No large vascular territory infarct. No acute intracranial hemorrhage, mass or midline shift. No extra-axial fluid collections. Vascular: No hyperdense vessels. Moderate atherosclerosis of the cavernous sinus carotids and left vertebral arteries. Skull: Incomplete posterior arch of C1, developmental in appearance. Sinuses/Orbits: No acute finding. Mild right maxillary sinus atelectasis. Other: None IMPRESSION: 1. Chronic stable small vessel ischemic disease. 2. Chronic right occipital encephalomalacia. 3. No acute intracranial abnormality. Electronically Signed   By: Ashley Royalty M.D.   On: 04/27/2017 20:03   Ct Chest Wo  Contrast  Result Date: 04/28/2017 CLINICAL DATA:  Possible pulmonary nodules. EXAM: CT CHEST WITHOUT CONTRAST TECHNIQUE: Multidetector CT imaging of the chest was performed following the standard protocol without IV contrast. COMPARISON:  Radiograph of April 27, 2017. FINDINGS: Cardiovascular: 4.1 cm ascending thoracic aortic aneurysm is noted. Atherosclerosis of thoracic aorta is noted. Coronary artery calcifications are noted. Normal cardiac size. No pericardial effusion is noted. Mediastinum/Nodes: Small sliding-type hiatal hernia is noted. Thyroid gland is unremarkable. No significant mediastinal adenopathy is noted. Lungs/Pleura: No pneumothorax or pleural effusion is noted. Emphysematous disease is noted in both upper lobes, predominantly on the right. No pulmonary nodules are noted. No acute pulmonary disease is noted. Upper Abdomen: Hepatic cirrhosis is noted. Mild splenomegaly is noted. Collateral veins are noted as well as paraesophageal varices. Cholelithiasis is noted. Musculoskeletal: No chest wall mass or suspicious bone lesions identified. IMPRESSION: 4.1 cm ascending thoracic aortic aneurysm. Recommend annual imaging followup by CTA or MRA. This recommendation follows 2010 ACCF/AHA/AATS/ACR/ASA/SCA/SCAI/SIR/STS/SVM Guidelines for the Diagnosis and Management of Patients with Thoracic Aortic Disease. Circulation. 2010; 121: P710-G269. No definite pulmonary nodules are noted. Coronary artery calcifications are noted suggesting coronary artery disease. Small sliding-type hiatal hernia is noted. Hepatic cirrhosis is noted with mild splenomegaly suggesting portal hypertension. Paraesophageal varices are noted as well. Aortic Atherosclerosis (ICD10-I70.0) and Emphysema (ICD10-J43.9). Electronically Signed   By: Marijo Conception, M.D.   On: 04/28/2017 11:46   Dg Chest Port 1 View  Result Date: 04/27/2017 CLINICAL DATA:  Fever EXAM: PORTABLE CHEST 1 VIEW COMPARISON:  03/02/2017 dating back to  01/31/2017 FINDINGS: The cardiopericardial silhouette is mildly enlarged, some which is likely  due to portable technique and low lung volumes. The aorta is not aneurysmal. Low lung volumes without pneumonic consolidation, effusion or pneumothorax. Rounded densities about the right hilum likely reflect vessels seen on end and central vascular congestion. Pulmonary nodules are not entirely excluded but believed less likely. No pneumonic consolidation. No effusion or pneumothorax. The visualized skeletal structures are unremarkable. IMPRESSION: 1. Low lung volumes with crowding of interstitial lung markings. Mild central vascular congestion. 2. Nodular densities about the right hilum are new and likely reflect pulmonary vessels seen on end. As pulmonary nodules are not entirely excluded, follow-up radiographs with PA and lateral views or chest CT may help exclude pulmonary nodules. Electronically Signed   By: Ashley Royalty M.D.   On: 04/27/2017 20:00   Ct Hematuria Workup  Result Date: 05/13/2017 CLINICAL DATA:  Patient with microscopic hematuria. EXAM: CT ABDOMEN AND PELVIS WITHOUT AND WITH CONTRAST TECHNIQUE: Multidetector CT imaging of the abdomen and pelvis was performed following the standard protocol before and following the bolus administration of intravenous contrast. CONTRAST:  125 cc Isovue 300 COMPARISON:  CT abdomen pelvis 03/02/2017. FINDINGS: Lower chest: Heart is enlarged. Dependent atelectasis within the bilateral lower lobes. Hepatobiliary: Liver is small and nodular in contour compatible with cirrhosis. Multiple stones within the gallbladder lumen. Mild gallbladder wall thickening and pericholecystic fluid. Pancreas: Unremarkable Spleen: Enlarged measuring 16.4 cm. Adrenals/Urinary Tract: Stable nodular thickening of the right adrenal gland. Left adrenal gland is normal. Noncontrast images demonstrate a 5 mm stone within the inferior pole left kidney (image 76; series 5). Persistent 1.5 cm partially  exophytic cyst inferior pole right kidney. No suspicious enhancing renal masses are identified. Urinary bladder is unremarkable. Delayed images demonstrate excretion of contrast material into the bilateral renal collecting systems ureters and bladder. The mid and distal right ureter are not well opacified with contrast and therefore not adequately assessed. No filling defects identified within the opacified portions. Stomach/Bowel: There is mild wall thickening of the small and large bowel which is nonspecific in the setting of portal venous hypertension and moderate volume ascites. No free intraperitoneal air. No evidence for bowel obstruction. Vascular/Lymphatic: Normal caliber abdominal aorta. Peripheral calcified atherosclerotic plaque. Re- cannulization of the periumbilical vein and upper abdominal venous collateralization. Calcified plaque involving the superior mesenteric vein. Multiple esophageal varices are demonstrated. Reproductive: Prostate unremarkable. Other: Moderate volume ascites. Musculoskeletal: Lower thoracic and lumbar spine degenerative changes. No aggressive or acute appearing osseous lesions. IMPRESSION: 1. Nonobstructing stone inferior pole left kidney. No suspicious enhancing renal masses are identified. No filling defects identified within the opacified renal collecting systems and ureters. 2. Cirrhosis of the liver with portal venous hypertension, esophageal varices and splenomegaly. 3. Wall thickening of the small and large bowel which is likely secondary to cirrhosis and portal venous hypertension. Underlying enteritis is not excluded. 4. Moderate volume ascites. Electronically Signed   By: Lovey Newcomer M.D.   On: 05/13/2017 14:35    Lab Data:  CBC: Recent Labs  Lab 05/12/17 1502 05/13/17 0533  WBC 9.6 7.7  NEUTROABS 7.7  --   HGB 10.5* 10.1*  HCT 30.0* 29.6*  MCV 98.4 100.0  PLT 46* 30*   Basic Metabolic Panel: Recent Labs  Lab 05/12/17 1502 05/12/17 1609  05/13/17 0533  NA 134*  --  138  K 2.8*  --  3.6  CL 104  --  107  CO2 24  --  25  GLUCOSE 180*  --  107*  BUN 7  --  8  CREATININE 0.81  --  0.65  CALCIUM 7.1*  --  7.2*  MG  --  1.3*  --    GFR: Estimated Creatinine Clearance: 124.9 mL/min (by C-G formula based on SCr of 0.65 mg/dL). Liver Function Tests: Recent Labs  Lab 05/12/17 1502 05/13/17 0533  AST 66* 63*  ALT 39 34  ALKPHOS 125 94  BILITOT 2.4* 3.4*  PROT 6.0* 5.6*  ALBUMIN 2.1* 1.9*   No results for input(s): LIPASE, AMYLASE in the last 168 hours. Recent Labs  Lab 05/12/17 1552 05/13/17 0533  AMMONIA 80* 55*   Coagulation Profile: Recent Labs  Lab 05/12/17 1502  INR 1.76   Cardiac Enzymes: No results for input(s): CKTOTAL, CKMB, CKMBINDEX, TROPONINI in the last 168 hours. BNP (last 3 results) No results for input(s): PROBNP in the last 8760 hours. HbA1C: No results for input(s): HGBA1C in the last 72 hours. CBG: Recent Labs  Lab 05/12/17 2345 05/13/17 1206 05/13/17 1712 05/13/17 2105 05/14/17 0756  GLUCAP 120* 125* 277* 178* 84   Lipid Profile: No results for input(s): CHOL, HDL, LDLCALC, TRIG, CHOLHDL, LDLDIRECT in the last 72 hours. Thyroid Function Tests: No results for input(s): TSH, T4TOTAL, FREET4, T3FREE, THYROIDAB in the last 72 hours. Anemia Panel: No results for input(s): VITAMINB12, FOLATE, FERRITIN, TIBC, IRON, RETICCTPCT in the last 72 hours. Urine analysis:    Component Value Date/Time   COLORURINE AMBER (A) 05/12/2017 1735   APPEARANCEUR CLEAR 05/12/2017 1735   LABSPEC 1.018 05/12/2017 1735   PHURINE 6.0 05/12/2017 1735   GLUCOSEU NEGATIVE 05/12/2017 1735   HGBUR MODERATE (A) 05/12/2017 1735   BILIRUBINUR NEGATIVE 05/12/2017 1735   KETONESUR NEGATIVE 05/12/2017 1735   PROTEINUR NEGATIVE 05/12/2017 1735   UROBILINOGEN 0.2 05/14/2015 1813   NITRITE NEGATIVE 05/12/2017 1735   LEUKOCYTESUR NEGATIVE 05/12/2017 [place] M.D. [facility] 05/14/2017, 9:44 AM  Pager: [phone] Between 7am to 7pm - call Pager - [phone]  After 7pm go to [email or web address] - password [email or web address]  Call night coverage person covering after 7pm

## 2017-05-14 NOTE — Progress Notes (Signed)
Received call from [facility] that one of two sets of blood cultures now growing gram negative rods (in addition to Strep species in both sets of blood cultures). Per lab, unable to run BCID on this organism. Provided information to Dr. Vista Lawman. ID consulted. Also provided information to Dr. Baxter Flattery. She has ordered repeat blood cultures and will see the patient in consultation.    Lindell Spar, PharmD, BCPS Pager: [phone] 05/14/2017 10:35 AM

## 2017-05-14 NOTE — Progress Notes (Signed)
Patient glucose is at 197 which requires 2 units of insulin for lunch coverage. Patient refused to take insulin stating that when he takes his long acting insulin he will not take short acting during the day. Informed patient of the difference between the short acting and long acting insulin and how they alter the glucose level at different times. Patient verbalized understanding and stated he would not take the short acting insulin for his meals today.

## 2017-05-14 NOTE — Progress Notes (Signed)
Patient had an uneventful day. Patient has been stable with ambulation in the room. Patient has continued to have frequent liquid stools. Patient declined insulin. Patient educated and informed of the benefits of taking the insulin coverage. Patient will continue to be monitored.

## 2017-05-15 ENCOUNTER — Other Ambulatory Visit: Payer: PPO

## 2017-05-15 ENCOUNTER — Inpatient Hospital Stay (HOSPITAL_COMMUNITY): Payer: PPO

## 2017-05-15 DIAGNOSIS — I340 Nonrheumatic mitral (valve) insufficiency: Secondary | ICD-10-CM

## 2017-05-15 DIAGNOSIS — R7881 Bacteremia: Secondary | ICD-10-CM

## 2017-05-15 DIAGNOSIS — R188 Other ascites: Secondary | ICD-10-CM

## 2017-05-15 DIAGNOSIS — E7220 Disorder of urea cycle metabolism, unspecified: Secondary | ICD-10-CM

## 2017-05-15 DIAGNOSIS — I330 Acute and subacute infective endocarditis: Secondary | ICD-10-CM

## 2017-05-15 DIAGNOSIS — A491 Streptococcal infection, unspecified site: Secondary | ICD-10-CM

## 2017-05-15 DIAGNOSIS — A498 Other bacterial infections of unspecified site: Secondary | ICD-10-CM

## 2017-05-15 DIAGNOSIS — A419 Sepsis, unspecified organism: Secondary | ICD-10-CM

## 2017-05-15 DIAGNOSIS — R197 Diarrhea, unspecified: Secondary | ICD-10-CM

## 2017-05-15 LAB — CULTURE, BLOOD (ROUTINE X 2)
Special Requests: ADEQUATE
Special Requests: ADEQUATE

## 2017-05-15 LAB — BODY FLUID CELL COUNT WITH DIFFERENTIAL
EOS FL: 0 %
Lymphs, Fluid: 13 %
MONOCYTE-MACROPHAGE-SEROUS FLUID: 37 % — AB (ref 50–90)
Neutrophil Count, Fluid: 50 % — ABNORMAL HIGH (ref 0–25)
WBC FLUID: 492 uL (ref 0–1000)

## 2017-05-15 LAB — GLUCOSE, PLEURAL OR PERITONEAL FLUID: GLUCOSE FL: 216 mg/dL

## 2017-05-15 LAB — GLUCOSE, CAPILLARY
GLUCOSE-CAPILLARY: 99 mg/dL (ref 65–99)
GLUCOSE-CAPILLARY: 183 mg/dL — AB (ref 65–99)
GLUCOSE-CAPILLARY: 182 mg/dL — AB (ref 65–99)
Glucose-Capillary: 158 mg/dL — ABNORMAL HIGH (ref 65–99)

## 2017-05-15 LAB — BASIC METABOLIC PANEL
ANION GAP: 5 (ref 5–15)
BUN: 10 mg/dL (ref 6–20)
CHLORIDE: 109 mmol/L (ref 101–111)
CO2: 25 mmol/L (ref 22–32)
Calcium: 8.1 mg/dL — ABNORMAL LOW (ref 8.9–10.3)
Creatinine, Ser: 0.73 mg/dL (ref 0.61–1.24)
GFR calc non Af Amer: >60 mL/min (ref >60)
Glucose, Bld: 105 mg/dL — ABNORMAL HIGH (ref 65–99)
POTASSIUM: 3.8 mmol/L (ref 3.5–5.1)
SODIUM: 139 mmol/L (ref 135–145)

## 2017-05-15 LAB — CBC
HCT: 28.9 % — ABNORMAL LOW (ref 39.0–52.0)
HEMOGLOBIN: 9.8 g/dL — AB (ref 13.0–17.0)
MCH: 33.6 pg (ref 26.0–34.0)
MCHC: 33.9 g/dL (ref 30.0–36.0)
MCV: 99 fL (ref 78.0–100.0)
Platelets: 50 10*3/uL — ABNORMAL LOW (ref 150–400)
RBC: 2.92 MIL/uL — AB (ref 4.22–5.81)
RDW: 15.2 % (ref 11.5–15.5)
WBC: 3.7 10*3/uL — ABNORMAL LOW (ref 4.0–10.5)

## 2017-05-15 LAB — ECHOCARDIOGRAM COMPLETE
HEIGHTINCHES: 67 in
Weight: 4515.02 oz

## 2017-05-15 LAB — ALBUMIN, PLEURAL OR PERITONEAL FLUID

## 2017-05-15 MED ORDER — LIDOCAINE HCL 2 % IJ SOLN
INTRAMUSCULAR | Status: AC
  Filled 2017-05-15: qty 10

## 2017-05-15 MED ORDER — ALBUMIN HUMAN 25 % IV SOLN
25.0000 g | INTRAVENOUS | Status: AC | PRN
  Administered 2017-05-15: 25 g via INTRAVENOUS
  Filled 2017-05-15 (×2): qty 100

## 2017-05-15 NOTE — Progress Notes (Signed)
  Echocardiogram 2D Echocardiogram has been performed.  Merrie Roof F 05/15/2017, 3:29 PM

## 2017-05-15 NOTE — Progress Notes (Signed)
PROGRESS NOTE    Adam Butler  JME:[id number] DOB: [birth year] DOA: 05/12/2017 PCP: [name], NP   Brief Narrative: 61 year old male with history of type 2 diabetes, hypertension, CAD, hepatic liver cirrhosis with esophageal varices, history of hepatic encephalopathy presented with a fever, dry cough, lethargy. Admitted for hepatic encephalopathy and bacteremia.  Assessment & Plan:   # Streptococcal/ Acinetobacter bacteremia likely GI source: -Follow up with repeat blood culture. -Infectious disease consult note from yesterday reviewed, ordered echocardiogram and ultrasound paracentesis for both diagnostic and therapeutic. Albumin before paracentesis. CT scan of abdomen and pelvis showed a moderate volume ascites. -Continue IV imipenem -Clinically improving. -Follow up ID for long-term antibiotics plan.  #Acute hepatitic encephalopathy: Continue lactulose. Mental status improved.  #History of liver cirrhosis with esophageal varices: Continue Xifaxan, Aldactone.  # Infectious diarrhea: GI pathogen growing Yersinia: Follow up ID.  #Type 2 diabetes: Continue current insulin regimen. Monitor blood sugar level.  #Chronic Thrombocytopenia: Platelet BP. No sign of bleeding.  DVT prophylaxis: SCD Code Status: Full code Family Communication: No family at bedside Disposition Plan: Currently admitted.    Consultants:   Infectious disease  Procedures: None Antimicrobials: Imipenem  Subjective: Seen and examined at bedside. Feeling better and wanted to go home today. Denied chest pain, shortness of breath, nausea and vomiting.  Objective: Vitals:   05/14/17 2126 05/14/17 2300 05/15/17 0500 05/15/17 0525  BP: (!) 103/41 (!) 111/52  (!) 117/48  Pulse: 63   65  Resp: 17   17  Temp: 98.3 F (36.8 C)   98.2 F (36.8 C)  TempSrc: Oral   Oral  SpO2: 99%   96%  Weight:   128 kg (282 lb 3 oz)   Height:        Intake/Output Summary (Last 24 hours) at 05/15/2017  1026 Last data filed at 05/15/2017 0200 Gross per 24 hour  Intake 440 ml  Output -  Net 440 ml   Filed Weights   05/12/17 2046 05/14/17 0607 05/15/17 0500  Weight: 130.4 kg (287 lb 7.7 oz) 128.6 kg (283 lb 9.6 oz) 128 kg (282 lb 3 oz)    Examination:  General exam: Appears calm and comfortable  Respiratory system: Clear to auscultation. Respiratory effort normal. No wheezing or crackle Cardiovascular system: S1 & S2 heard, RRR.  No pedal edema. Gastrointestinal system: Abdomen is nondistended, soft and nontender. Normal bowel sounds heard. Central nervous system: Alert and oriented. No focal neurological deficits. Skin: No rashes, lesions or ulcers Psychiatry: Judgement and insight appear normal. Mood & affect appropriate.     Data Reviewed: I have personally reviewed following labs and imaging studies  CBC: Recent Labs  Lab 05/12/17 1502 05/13/17 0533 05/15/17 0739  WBC 9.6 7.7 3.7*  NEUTROABS 7.7  --   --   HGB 10.5* 10.1* 9.8*  HCT 30.0* 29.6* 28.9*  MCV 98.4 100.0 99.0  PLT 46* 30* 50*   Basic Metabolic Panel: Recent Labs  Lab 05/12/17 1502 05/12/17 1609 05/13/17 0533 05/15/17 0739  NA 134*  --  138 139  K 2.8*  --  3.6 3.8  CL 104  --  107 109  CO2 24  --  25 25  GLUCOSE 180*  --  107* 105*  BUN 7  --  8 10  CREATININE 0.81  --  0.65 0.73  CALCIUM 7.1*  --  7.2* 8.1*  MG  --  1.3*  --   --    GFR: Estimated Creatinine Clearance: 124.7 mL/min (  by C-G formula based on SCr of 0.73 mg/dL). Liver Function Tests: Recent Labs  Lab 05/12/17 1502 05/13/17 0533  AST 66* 63*  ALT 39 34  ALKPHOS 125 94  BILITOT 2.4* 3.4*  PROT 6.0* 5.6*  ALBUMIN 2.1* 1.9*   No results for input(s): LIPASE, AMYLASE in the last 168 hours. Recent Labs  Lab 05/12/17 1552 05/13/17 0533  AMMONIA 80* 55*   Coagulation Profile: Recent Labs  Lab 05/12/17 1502  INR 1.76   Cardiac Enzymes: No results for input(s): CKTOTAL, CKMB, CKMBINDEX, TROPONINI in the last 168  hours. BNP (last 3 results) No results for input(s): PROBNP in the last 8760 hours. HbA1C: No results for input(s): HGBA1C in the last 72 hours. CBG: Recent Labs  Lab 05/14/17 0756 05/14/17 1134 05/14/17 1720 05/14/17 2118 05/15/17 0750  GLUCAP 84 197* 210* 186* 99   Lipid Profile: No results for input(s): CHOL, HDL, LDLCALC, TRIG, CHOLHDL, LDLDIRECT in the last 72 hours. Thyroid Function Tests: No results for input(s): TSH, T4TOTAL, FREET4, T3FREE, THYROIDAB in the last 72 hours. Anemia Panel: No results for input(s): VITAMINB12, FOLATE, FERRITIN, TIBC, IRON, RETICCTPCT in the last 72 hours. Sepsis Labs: Recent Labs  Lab 05/12/17 1508 05/12/17 1705  LATICACIDVEN 2.01* 1.34    Recent Results (from the past 240 hour(s))  Culture, blood (Routine x 2)     Status: Abnormal   Collection Time: 05/12/17  3:03 PM  Result Value Ref Range Status   Specimen Description BLOOD RIGHT HAND  Final   Special Requests   Final    BOTTLES DRAWN AEROBIC AND ANAEROBIC Blood Culture adequate volume   Culture  Setup Time   Final    GRAM POSITIVE COCCI IN PAIRS AND CHAINS IN BOTH AEROBIC AND ANAEROBIC BOTTLES CRITICAL RESULT CALLED TO, READ BACK BY AND VERIFIED WITH: N GLOGOVAC,PHARMD AT 0725 05/13/17 BY L BENFIELD Performed at [facility], [place] [address], [place], [place] [address]    Culture STREPTOCOCCUS GALLOLYTICUS (A)  Final   Report Status 05/15/2017 FINAL  Final   Organism ID, Bacteria STREPTOCOCCUS GALLOLYTICUS  Final      Susceptibility   Streptococcus gallolyticus - MIC*    PENICILLIN 0.12 SENSITIVE Sensitive     CEFTRIAXONE 0.25 SENSITIVE Sensitive     ERYTHROMYCIN <=0.12 SENSITIVE Sensitive     LEVOFLOXACIN 4 INTERMEDIATE Intermediate     VANCOMYCIN 0.25 SENSITIVE Sensitive     * STREPTOCOCCUS GALLOLYTICUS  Blood Culture ID Panel (Reflexed)     Status: Abnormal   Collection Time: 05/12/17  3:03 PM  Result Value Ref Range Status   Enterococcus species NOT DETECTED NOT  DETECTED Final   Listeria monocytogenes NOT DETECTED NOT DETECTED Final   Staphylococcus species NOT DETECTED NOT DETECTED Final   Staphylococcus aureus NOT DETECTED NOT DETECTED Final   Streptococcus species DETECTED (A) NOT DETECTED Final    Comment: Not Enterococcus species, Streptococcus agalactiae, Streptococcus pyogenes, or Streptococcus pneumoniae. CRITICAL RESULT CALLED TO, READ BACK BY AND VERIFIED WITH: N GLOGOVAC,PHARMD AT 0725 05/13/17 BY L BENFIELD    Streptococcus agalactiae NOT DETECTED NOT DETECTED Final   Streptococcus pneumoniae NOT DETECTED NOT DETECTED Final   Streptococcus pyogenes NOT DETECTED NOT DETECTED Final   Acinetobacter baumannii NOT DETECTED NOT DETECTED Final   Enterobacteriaceae species NOT DETECTED NOT DETECTED Final   Enterobacter cloacae complex NOT DETECTED NOT DETECTED Final   Escherichia coli NOT DETECTED NOT DETECTED Final   Klebsiella oxytoca NOT DETECTED NOT DETECTED Final   Klebsiella pneumoniae  NOT DETECTED NOT DETECTED Final   Proteus species NOT DETECTED NOT DETECTED Final   Serratia marcescens NOT DETECTED NOT DETECTED Final   Haemophilus influenzae NOT DETECTED NOT DETECTED Final   Neisseria meningitidis NOT DETECTED NOT DETECTED Final   Pseudomonas aeruginosa NOT DETECTED NOT DETECTED Final   Candida albicans NOT DETECTED NOT DETECTED Final   Candida glabrata NOT DETECTED NOT DETECTED Final   Candida krusei NOT DETECTED NOT DETECTED Final   Candida parapsilosis NOT DETECTED NOT DETECTED Final   Candida tropicalis NOT DETECTED NOT DETECTED Final    Comment: Performed at [facility], [place] [address], [place], [place] [address]  Culture, blood (Routine x 2)     Status: Abnormal   Collection Time: 05/12/17  3:55 PM  Result Value Ref Range Status   Specimen Description BLOOD LEFT HAND  Final   Special Requests   Final    BOTTLES DRAWN AEROBIC AND ANAEROBIC Blood Culture adequate volume   Culture  Setup Time   Final    GRAM POSITIVE  COCCI IN PAIRS AND CHAINS IN BOTH AEROBIC AND ANAEROBIC BOTTLES CRITICAL VALUE NOTED.  VALUE IS CONSISTENT WITH PREVIOUSLY REPORTED AND CALLED VALUE.    Culture (A)  Final    STREPTOCOCCUS GALLOLYTICUS ACINETOBACTER CALCOACETICUS/BAUMANNII COMPLEX CRITICAL RESULT CALLED TO, READ BACK BY AND VERIFIED WITH: J GADHIA,PHARMD AT 1010 05/14/17 BY L BENFIELD CONCERNING GROWTH ON CULTURE SUSCEPTIBILITIES PERFORMED ON PREVIOUS CULTURE WITHIN THE LAST 5 DAYS. ON STREPTOCOCCUS GALLOLYTICUS Performed at [facility], [place] [address] [place], [place] [address]    Report Status 05/15/2017 FINAL  Final   Organism ID, Bacteria ACINETOBACTER CALCOACETICUS/BAUMANNII COMPLEX  Final      Susceptibility   Acinetobacter calcoaceticus/baumannii complex - MIC*    CEFTAZIDIME 4 SENSITIVE Sensitive     CEFTRIAXONE 16 INTERMEDIATE Intermediate     CIPROFLOXACIN <=0.25 SENSITIVE Sensitive     GENTAMICIN <=1 SENSITIVE Sensitive     IMIPENEM <=0.25 SENSITIVE Sensitive     PIP/TAZO 32 INTERMEDIATE Intermediate     TRIMETH/SULFA 160 RESISTANT Resistant     CEFEPIME 4 SENSITIVE Sensitive     AMPICILLIN/SULBACTAM <=2 SENSITIVE Sensitive     * ACINETOBACTER CALCOACETICUS/BAUMANNII COMPLEX  Respiratory Panel by PCR     Status: None   Collection Time: 05/12/17  7:58 PM  Result Value Ref Range Status   Adenovirus NOT DETECTED NOT DETECTED Final   Coronavirus 229E NOT DETECTED NOT DETECTED Final   Coronavirus HKU1 NOT DETECTED NOT DETECTED Final   Coronavirus NL63 NOT DETECTED NOT DETECTED Final   Coronavirus OC43 NOT DETECTED NOT DETECTED Final   Metapneumovirus NOT DETECTED NOT DETECTED Final   Rhinovirus / Enterovirus NOT DETECTED NOT DETECTED Final   Influenza A NOT DETECTED NOT DETECTED Final   Influenza B NOT DETECTED NOT DETECTED Final   Parainfluenza Virus 1 NOT DETECTED NOT DETECTED Final   Parainfluenza Virus 2 NOT DETECTED NOT DETECTED Final   Parainfluenza Virus 3 NOT DETECTED NOT DETECTED Final    Parainfluenza Virus 4 NOT DETECTED NOT DETECTED Final   Respiratory Syncytial Virus NOT DETECTED NOT DETECTED Final   Bordetella pertussis NOT DETECTED NOT DETECTED Final   Chlamydophila pneumoniae NOT DETECTED NOT DETECTED Final   Mycoplasma pneumoniae NOT DETECTED NOT DETECTED Final    Comment: Performed at [facility], [place] [address], [place], [place] [address]  Gastrointestinal Panel by PCR , Stool     Status: Abnormal   Collection Time: 05/12/17  7:58 PM  Result Value Ref Range Status   Campylobacter species NOT DETECTED NOT DETECTED Final   Plesimonas shigelloides NOT DETECTED NOT DETECTED Final   Salmonella species NOT DETECTED NOT DETECTED Final   Yersinia enterocolitica DETECTED (A) NOT DETECTED Final    Comment: RESULT CALLED TO, READ BACK BY AND VERIFIED WITH:  Randa Lynn AT 0624 05/14/17 SDR    Vibrio species NOT DETECTED NOT DETECTED Final   Vibrio cholerae NOT DETECTED NOT DETECTED Final   Enteroaggregative E coli (EAEC) NOT DETECTED NOT DETECTED Final   Enteropathogenic E coli (EPEC) NOT DETECTED NOT DETECTED Final   Enterotoxigenic E coli (ETEC) NOT DETECTED NOT DETECTED Final   Shiga like toxin producing E coli (STEC) NOT DETECTED NOT DETECTED Final   Shigella/Enteroinvasive E coli (EIEC) NOT DETECTED NOT DETECTED Final   Cryptosporidium NOT DETECTED NOT DETECTED Final   Cyclospora cayetanensis NOT DETECTED NOT DETECTED Final   Entamoeba histolytica NOT DETECTED NOT DETECTED Final   Giardia lamblia NOT DETECTED NOT DETECTED Final   Adenovirus F40/41 NOT DETECTED NOT DETECTED Final   Astrovirus NOT DETECTED NOT DETECTED Final   Norovirus GI/GII NOT DETECTED NOT DETECTED Final   Rotavirus A NOT DETECTED NOT DETECTED Final   Sapovirus (I, II, IV, and V) NOT DETECTED NOT DETECTED Final         Radiology Studies: No results found.      Scheduled Meds: . aspirin EC  81 mg Oral Daily   And  . calcium carbonate  1 tablet Oral Daily  . atorvastatin   10 mg Oral Daily  . famotidine  10 mg Oral BID  . feeding supplement (PRO-STAT SUGAR FREE 64)  30 mL Oral BID  . insulin aspart  0-5 Units Subcutaneous QHS  . insulin aspart  0-9 Units Subcutaneous TID WC  . insulin glargine  8 Units Subcutaneous QHS  . lactulose  20 g Oral QHS  . multivitamin with minerals  1 tablet Oral Daily  . PARoxetine  10 mg Oral Daily  . rifaximin  550 mg Oral BID  . sodium chloride flush  3 mL Intravenous Q12H  . spironolactone  12.5 mg Oral Daily  . vitamin B-12  1,000 mcg Oral Daily   Continuous Infusions: . sodium chloride    . albumin human    . imipenem-cilastatin Stopped (05/15/17 0552)     LOS: 3 days    Horris Speros Tanna Furry, MD [facility] Pager [phone]  If 7PM-7AM, please contact night-coverage [email or web address] Password [email or web address] 05/15/2017, 10:26 AM

## 2017-05-15 NOTE — Progress Notes (Signed)
[facility] Coordinator will follow pt with ID team to support home IV ABX at DC as ordered/needed.  If patient discharges after hours, please call [phone].   Larry Sierras 05/15/2017, 9:43 AM

## 2017-05-15 NOTE — Procedures (Signed)
PROCEDURE SUMMARY:  Successful US guided paracentesis from left lateral abdomen.  Yielded 1.7 liters of hazy, yellow fluid.  No immediate complications.  Pt tolerated well.   Specimen was sent for labs.  Docia Barrier PA-C 05/15/2017 4:32 PM

## 2017-05-15 NOTE — Progress Notes (Signed)
Subjective:  "am I getting to go home today?"   Antibiotics:  Anti-infectives (From admission, onward)   Start     Dose/Rate Route Frequency Ordered Stop   05/14/17 1800  imipenem-cilastatin (PRIMAXIN) 500 mg in sodium chloride 0.9 % 100 mL IVPB     500 mg 200 mL/hr over 30 Minutes Intravenous Every 6 hours 05/14/17 1702     05/13/17 1000  cefTRIAXone (ROCEPHIN) 2 g in dextrose 5 % 50 mL IVPB  Status:  Discontinued     2 g 100 mL/hr over 30 Minutes Intravenous Every 24 hours 05/13/17 0851 05/14/17 1657   05/13/17 0300  piperacillin-tazobactam (ZOSYN) IVPB 3.375 g  Status:  Discontinued     3.375 g 12.5 mL/hr over 240 Minutes Intravenous Every 8 hours 05/13/17 0246 05/13/17 0844   05/12/17 2359  vancomycin (VANCOCIN) 1,250 mg in sodium chloride 0.9 % 250 mL IVPB  Status:  Discontinued     1,250 mg 166.7 mL/hr over 90 Minutes Intravenous Every 12 hours 05/12/17 2150 05/13/17 0844   05/12/17 2200  rifaximin (XIFAXAN) tablet 550 mg     550 mg Oral 2 times daily 05/12/17 1951     05/12/17 1600  piperacillin-tazobactam (ZOSYN) IVPB 3.375 g     3.375 g 100 mL/hr over 30 Minutes Intravenous  Once 05/12/17 1547 05/12/17 1623   05/12/17 1600  vancomycin (VANCOCIN) IVPB 1000 mg/200 mL premix     1,000 mg 200 mL/hr over 60 Minutes Intravenous  Once 05/12/17 1547 05/12/17 1738      Medications: Scheduled Meds: . aspirin EC  81 mg Oral Daily   And  . calcium carbonate  1 tablet Oral Daily  . atorvastatin  10 mg Oral Daily  . famotidine  10 mg Oral BID  . feeding supplement (PRO-STAT SUGAR FREE 64)  30 mL Oral BID  . insulin aspart  0-5 Units Subcutaneous QHS  . insulin aspart  0-9 Units Subcutaneous TID WC  . insulin glargine  8 Units Subcutaneous QHS  . lactulose  20 g Oral QHS  . lidocaine      . multivitamin with minerals  1 tablet Oral Daily  . PARoxetine  10 mg Oral Daily  . rifaximin  550 mg Oral BID  . sodium chloride flush  3 mL Intravenous Q12H  .  spironolactone  12.5 mg Oral Daily  . vitamin B-12  1,000 mcg Oral Daily   Continuous Infusions: . sodium chloride    . imipenem-cilastatin 500 mg (05/15/17 1853)   PRN Meds:.sodium chloride, fluticasone, nitroGLYCERIN, sodium chloride flush    Objective: Weight change: -6.6 oz (-0.64 kg)  Intake/Output Summary (Last 24 hours) at 05/15/2017 2000 Last data filed at 05/15/2017 1030 Gross per 24 hour  Intake 340 ml  Output -  Net 340 ml   Blood pressure 120/88, pulse 60, temperature 98 F (36.7 C), temperature source Oral, resp. rate 20, height 5' 7"  (1.702 m), weight 282 lb 3 oz (128 kg), SpO2 99 %. Temp:  [98 F (36.7 C)-98.3 F (36.8 C)] 98 F (36.7 C) (11/05 1358) Pulse Rate:  [60-65] 60 (11/05 1358) Resp:  [17-20] 20 (11/05 1358) BP: (103-130)/(41-93) 120/88 (11/05 1642) SpO2:  [96 %-99 %] 99 % (11/05 1358) Weight:  [282 lb 3 oz (128 kg)] 282 lb 3 oz (128 kg) (11/05 0500)  Physical Exam: General: Alert and awake, oriented getting TTE HEENT: anicteric sclera, , EOMI [facility] regular rate, Chest:no wheezing, resp distress Abdomen: soft  distendedExtremities: no  clubbing or edema noted bilaterally Skin: no rashes Neuro: nonfocal  CBC:  CBC Latest Ref Rng & Units 05/15/2017 05/13/2017 05/12/2017  WBC 4.0 - 10.5 K/uL 3.7(L) 7.7 9.6  Hemoglobin 13.0 - 17.0 g/dL 9.8(L) 10.1(L) 10.5(L)  Hematocrit 39.0 - 52.0 % 28.9(L) 29.6(L) 30.0(L)  Platelets 150 - 400 K/uL 50(L) 30(L) 46(L)      BMET Recent Labs    05/13/17 0533 05/15/17 0739  NA 138 139  K 3.6 3.8  CL 107 109  CO2 25 25  GLUCOSE 107* 105*  BUN 8 10  CREATININE 0.65 0.73  CALCIUM 7.2* 8.1*     Liver Panel  Recent Labs    05/13/17 0533  PROT 5.6*  ALBUMIN 1.9*  AST 63*  ALT 34  ALKPHOS 94  BILITOT 3.4*       Sedimentation Rate No results for input(s): ESRSEDRATE in the last 72 hours. C-Reactive Protein No results for input(s): CRP in the last 72 hours.  Micro Results: Recent Results (from  the past 720 hour(s))  Blood Culture (routine x 2)     Status: None   Collection Time: 04/27/17  6:40 PM  Result Value Ref Range Status   Specimen Description BLOOD LEFT HAND  Final   Special Requests   Final    BOTTLES DRAWN AEROBIC AND ANAEROBIC Blood Culture adequate volume   Culture NO GROWTH 5 DAYS  Final   Report Status 05/02/2017 FINAL  Final  Blood Culture (routine x 2)     Status: None   Collection Time: 04/27/17  6:55 PM  Result Value Ref Range Status   Specimen Description BLOOD BLOOD RIGHT FOREARM  Final   Special Requests IN PEDIATRIC BOTTLE Blood Culture adequate volume  Final   Culture NO GROWTH 5 DAYS  Final   Report Status 05/02/2017 FINAL  Final  Urine culture     Status: None   Collection Time: 04/27/17  8:15 PM  Result Value Ref Range Status   Specimen Description URINE, CATHETERIZED  Final   Special Requests NONE  Final   Culture NO GROWTH  Final   Report Status 04/29/2017 FINAL  Final  MRSA PCR Screening     Status: None   Collection Time: 04/27/17 11:36 PM  Result Value Ref Range Status   MRSA by PCR NEGATIVE NEGATIVE Final    Comment:        The GeneXpert MRSA Assay (FDA approved for NASAL specimens only), is one component of a comprehensive MRSA colonization surveillance program. It is not intended to diagnose MRSA infection nor to guide or monitor treatment for MRSA infections.   Respiratory Panel by PCR     Status: None   Collection Time: 04/28/17  9:58 AM  Result Value Ref Range Status   Adenovirus NOT DETECTED NOT DETECTED Final   Coronavirus 229E NOT DETECTED NOT DETECTED Final   Coronavirus HKU1 NOT DETECTED NOT DETECTED Final   Coronavirus NL63 NOT DETECTED NOT DETECTED Final   Coronavirus OC43 NOT DETECTED NOT DETECTED Final   Metapneumovirus NOT DETECTED NOT DETECTED Final   Rhinovirus / Enterovirus NOT DETECTED NOT DETECTED Final   Influenza A NOT DETECTED NOT DETECTED Final   Influenza B NOT DETECTED NOT DETECTED Final    Parainfluenza Virus 1 NOT DETECTED NOT DETECTED Final   Parainfluenza Virus 2 NOT DETECTED NOT DETECTED Final   Parainfluenza Virus 3 NOT DETECTED NOT DETECTED Final   Parainfluenza Virus 4 NOT DETECTED NOT DETECTED Final   Respiratory  Syncytial Virus NOT DETECTED NOT DETECTED Final   Bordetella pertussis NOT DETECTED NOT DETECTED Final   Chlamydophila pneumoniae NOT DETECTED NOT DETECTED Final   Mycoplasma pneumoniae NOT DETECTED NOT DETECTED Final  Culture, blood (Routine x 2)     Status: Abnormal   Collection Time: 05/12/17  3:03 PM  Result Value Ref Range Status   Specimen Description BLOOD RIGHT HAND  Final   Special Requests   Final    BOTTLES DRAWN AEROBIC AND ANAEROBIC Blood Culture adequate volume   Culture  Setup Time   Final    GRAM POSITIVE COCCI IN PAIRS AND CHAINS IN BOTH AEROBIC AND ANAEROBIC BOTTLES CRITICAL RESULT CALLED TO, READ BACK BY AND VERIFIED WITH: N GLOGOVAC,PHARMD AT 0725 05/13/17 BY L BENFIELD Performed at [facility], [place] [address] [place], [place] [address]    Culture STREPTOCOCCUS GALLOLYTICUS (A)  Final   Report Status 05/15/2017 FINAL  Final   Organism ID, Bacteria STREPTOCOCCUS GALLOLYTICUS  Final      Susceptibility   Streptococcus gallolyticus - MIC*    PENICILLIN 0.12 SENSITIVE Sensitive     CEFTRIAXONE 0.25 SENSITIVE Sensitive     ERYTHROMYCIN <=0.12 SENSITIVE Sensitive     LEVOFLOXACIN 4 INTERMEDIATE Intermediate     VANCOMYCIN 0.25 SENSITIVE Sensitive     * STREPTOCOCCUS GALLOLYTICUS  Blood Culture ID Panel (Reflexed)     Status: Abnormal   Collection Time: 05/12/17  3:03 PM  Result Value Ref Range Status   Enterococcus species NOT DETECTED NOT DETECTED Final   Listeria monocytogenes NOT DETECTED NOT DETECTED Final   Staphylococcus species NOT DETECTED NOT DETECTED Final   Staphylococcus aureus NOT DETECTED NOT DETECTED Final   Streptococcus species DETECTED (A) NOT DETECTED Final    Comment: Not Enterococcus species,  Streptococcus agalactiae, Streptococcus pyogenes, or Streptococcus pneumoniae. CRITICAL RESULT CALLED TO, READ BACK BY AND VERIFIED WITH: N GLOGOVAC,PHARMD AT 0725 05/13/17 BY L BENFIELD    Streptococcus agalactiae NOT DETECTED NOT DETECTED Final   Streptococcus pneumoniae NOT DETECTED NOT DETECTED Final   Streptococcus pyogenes NOT DETECTED NOT DETECTED Final   Acinetobacter baumannii NOT DETECTED NOT DETECTED Final   Enterobacteriaceae species NOT DETECTED NOT DETECTED Final   Enterobacter cloacae complex NOT DETECTED NOT DETECTED Final   Escherichia coli NOT DETECTED NOT DETECTED Final   Klebsiella oxytoca NOT DETECTED NOT DETECTED Final   Klebsiella pneumoniae NOT DETECTED NOT DETECTED Final   Proteus species NOT DETECTED NOT DETECTED Final   Serratia marcescens NOT DETECTED NOT DETECTED Final   Haemophilus influenzae NOT DETECTED NOT DETECTED Final   Neisseria meningitidis NOT DETECTED NOT DETECTED Final   Pseudomonas aeruginosa NOT DETECTED NOT DETECTED Final   Candida albicans NOT DETECTED NOT DETECTED Final   Candida glabrata NOT DETECTED NOT DETECTED Final   Candida krusei NOT DETECTED NOT DETECTED Final   Candida parapsilosis NOT DETECTED NOT DETECTED Final   Candida tropicalis NOT DETECTED NOT DETECTED Final    Comment: Performed at [facility], [place]. [address], [place], [place] [address]  Culture, blood (Routine x 2)     Status: Abnormal   Collection Time: 05/12/17  3:55 PM  Result Value Ref Range Status   Specimen Description BLOOD LEFT HAND  Final   Special Requests   Final    BOTTLES DRAWN AEROBIC AND ANAEROBIC Blood Culture adequate volume   Culture  Setup Time   Final    GRAM POSITIVE COCCI IN PAIRS AND CHAINS IN BOTH AEROBIC AND ANAEROBIC  BOTTLES CRITICAL VALUE NOTED.  VALUE IS CONSISTENT WITH PREVIOUSLY REPORTED AND CALLED VALUE.    Culture (A)  Final    STREPTOCOCCUS GALLOLYTICUS ACINETOBACTER CALCOACETICUS/BAUMANNII COMPLEX CRITICAL RESULT CALLED TO,  READ BACK BY AND VERIFIED WITH: J GADHIA,PHARMD AT 1010 05/14/17 BY L BENFIELD CONCERNING GROWTH ON CULTURE SUSCEPTIBILITIES PERFORMED ON PREVIOUS CULTURE WITHIN THE LAST 5 DAYS. ON STREPTOCOCCUS GALLOLYTICUS Performed at [facility], [place] [address] [place], [place] [address]    Report Status 05/15/2017 FINAL  Final   Organism ID, Bacteria ACINETOBACTER CALCOACETICUS/BAUMANNII COMPLEX  Final      Susceptibility   Acinetobacter calcoaceticus/baumannii complex - MIC*    CEFTAZIDIME 4 SENSITIVE Sensitive     CEFTRIAXONE 16 INTERMEDIATE Intermediate     CIPROFLOXACIN <=0.25 SENSITIVE Sensitive     GENTAMICIN <=1 SENSITIVE Sensitive     IMIPENEM <=0.25 SENSITIVE Sensitive     PIP/TAZO 32 INTERMEDIATE Intermediate     TRIMETH/SULFA 160 RESISTANT Resistant     CEFEPIME 4 SENSITIVE Sensitive     AMPICILLIN/SULBACTAM <=2 SENSITIVE Sensitive     * ACINETOBACTER CALCOACETICUS/BAUMANNII COMPLEX  Respiratory Panel by PCR     Status: None   Collection Time: 05/12/17  7:58 PM  Result Value Ref Range Status   Adenovirus NOT DETECTED NOT DETECTED Final   Coronavirus 229E NOT DETECTED NOT DETECTED Final   Coronavirus HKU1 NOT DETECTED NOT DETECTED Final   Coronavirus NL63 NOT DETECTED NOT DETECTED Final   Coronavirus OC43 NOT DETECTED NOT DETECTED Final   Metapneumovirus NOT DETECTED NOT DETECTED Final   Rhinovirus / Enterovirus NOT DETECTED NOT DETECTED Final   Influenza A NOT DETECTED NOT DETECTED Final   Influenza B NOT DETECTED NOT DETECTED Final   Parainfluenza Virus 1 NOT DETECTED NOT DETECTED Final   Parainfluenza Virus 2 NOT DETECTED NOT DETECTED Final   Parainfluenza Virus 3 NOT DETECTED NOT DETECTED Final   Parainfluenza Virus 4 NOT DETECTED NOT DETECTED Final   Respiratory Syncytial Virus NOT DETECTED NOT DETECTED Final   Bordetella pertussis NOT DETECTED NOT DETECTED Final   Chlamydophila pneumoniae NOT DETECTED NOT DETECTED Final   Mycoplasma pneumoniae NOT DETECTED NOT  DETECTED Final    Comment: Performed at [facility], [place] [address] [place],  [address]  Gastrointestinal Panel by PCR , Stool     Status: Abnormal   Collection Time: 05/12/17  7:58 PM  Result Value Ref Range Status   Campylobacter species NOT DETECTED NOT DETECTED Final   Plesimonas shigelloides NOT DETECTED NOT DETECTED Final   Salmonella species NOT DETECTED NOT DETECTED Final   Yersinia enterocolitica DETECTED (A) NOT DETECTED Final    Comment: RESULT CALLED TO, READ BACK BY AND VERIFIED WITH:  Randa Lynn AT 0624 05/14/17 SDR    Vibrio species NOT DETECTED NOT DETECTED Final   Vibrio cholerae NOT DETECTED NOT DETECTED Final   Enteroaggregative E coli (EAEC) NOT DETECTED NOT DETECTED Final   Enteropathogenic E coli (EPEC) NOT DETECTED NOT DETECTED Final   Enterotoxigenic E coli (ETEC) NOT DETECTED NOT DETECTED Final   Shiga like toxin producing E coli (STEC) NOT DETECTED NOT DETECTED Final   Shigella/Enteroinvasive E coli (EIEC) NOT DETECTED NOT DETECTED Final   Cryptosporidium NOT DETECTED NOT DETECTED Final   Cyclospora cayetanensis NOT DETECTED NOT DETECTED Final   Entamoeba histolytica NOT DETECTED NOT DETECTED Final   Giardia lamblia NOT DETECTED NOT DETECTED Final   Adenovirus F40/41 NOT DETECTED NOT DETECTED Final   Astrovirus NOT DETECTED NOT DETECTED  Final   Norovirus GI/GII NOT DETECTED NOT DETECTED Final   Rotavirus A NOT DETECTED NOT DETECTED Final   Sapovirus (I, II, IV, and V) NOT DETECTED NOT DETECTED Final  Culture, blood (routine x 2)     Status: None (Preliminary result)   Collection Time: 05/14/17 11:42 AM  Result Value Ref Range Status   Specimen Description BLOOD RIGHT ANTECUBITAL  Final   Special Requests   Final    BOTTLES DRAWN AEROBIC ONLY Blood Culture adequate volume   Culture   Final    NO GROWTH < 24 HOURS Performed at [facility], [address] [place], [place] [address]    Report Status PENDING  Incomplete  Culture,  blood (routine x 2)     Status: None (Preliminary result)   Collection Time: 05/14/17 11:42 AM  Result Value Ref Range Status   Specimen Description BLOOD BLOOD RIGHT FOREARM  Final   Special Requests IN PEDIATRIC BOTTLE Blood Culture adequate volume  Final   Culture   Final    NO GROWTH < 24 HOURS Performed at [facility], [place] [address], [place], [place] [address]    Report Status PENDING  Incomplete    Studies/Results: US Paracentesis  Result Date: 05/15/2017 INDICATION: Patient with abdominal distention and ascites. For diagnostic and therapeutic paracentesis. EXAM: ULTRASOUND GUIDED DIAGNOSTIC AND THERAPEUTIC PARACENTESIS MEDICATIONS: 1% lidocaine COMPLICATIONS: None immediate. PROCEDURE: Informed written consent was obtained from the patient after a discussion of the risks, benefits and alternatives to treatment. A timeout was performed prior to the initiation of the procedure. Initial ultrasound scanning demonstrates a large amount of ascites within the left lateral abdomen. The left lateral abdomen was prepped and draped in the usual sterile fashion. 1% lidocaine was used for local anesthesia. Following this, a 19 gauge, 7-cm, Yueh catheter was introduced. An ultrasound image was saved for documentation purposes. The paracentesis was performed. The catheter was removed and a dressing was applied. The patient tolerated the procedure well without immediate post procedural complication. FINDINGS: A total of approximately 1.7 of hazy, yellow fluid was removed. Samples were sent to the laboratory as requested by the clinical team. IMPRESSION: Successful ultrasound-guided diagnostic and therapeutic paracentesis yielding 1.7 liters of peritoneal fluid. Read by:  Brynda Greathouse PA-C Electronically Signed   By: Lucrezia Europe M.D.   On: 05/15/2017 16:43      Assessment/Plan:  INTERVAL HISTORY: pt now sp TTE and US guided paracentesis   Principal Problem:   Fever Active Problems:    Bacteremia   Liver cirrhosis secondary to NASH (HCC)   Hypokalemia   Anemia   Diabetes mellitus type 2, uncontrolled, with complications (HCC)   Thrombocytopenia (HCC)   Ascites   Hyperammonemia (HCC)   Diarrhea of presumed infectious origin   Infection due to acinetobacter baumannii    Adam Butler is a 61 y.o. male with NASH, cirrhosis with admission with hepatic encephalopathy with blood cultures with Streptococcus gallyolyticus in 2/2 blood cultures and acinetobacter in 1/2 blood cultures now sp paracentesis.  #1 Streptococcus gallyolyticus (bovis) bacteremia:  This organism is NOTORIOUS for causing endocarditis  TTE does not show endocarditis but not sensitive enough  Consider TEE  Otherwise would definitely treat presumptively for endocarditis  Currently he is on IMIPENEM to potentially cover the acinetbacter though I suspect the latter is a contaminant,   Source is likely intra-abdominal and he also needs colonoscopy to look for colon cancer given association with Strep gallyolyticus   #  2 Acinetobacter in blood cultures: is S to IMIPENEM as well as ceftazidime, unasyn  It is NOT growing in the other culture so it might be a contaminant but may consider covering this since we worry about intrabdominal source and may be able to cover with a drug that will be the mainstay for Strep bovis  #3 ? SBP: PMNS not quite meeting criteria but this was also after abx  #4 NASH and pancytopenia: defer to primary team and hepatology  #5 Desire to be DC home and to be able to vote. I am not sure if CM can help him with desire to vote but with re to his safety I DO NOT think it is appropriate to DC him in the midst of treatment for bacteremia and potential endocarditis.              LOS: 3 days   Alcide Evener 05/15/2017, 8:00 PM

## 2017-05-16 DIAGNOSIS — K766 Portal hypertension: Secondary | ICD-10-CM

## 2017-05-16 DIAGNOSIS — K652 Spontaneous bacterial peritonitis: Secondary | ICD-10-CM

## 2017-05-16 DIAGNOSIS — A498 Other bacterial infections of unspecified site: Secondary | ICD-10-CM

## 2017-05-16 LAB — GRAM STAIN

## 2017-05-16 LAB — GLUCOSE, CAPILLARY
GLUCOSE-CAPILLARY: 132 mg/dL — AB (ref 65–99)
GLUCOSE-CAPILLARY: 170 mg/dL — AB (ref 65–99)
Glucose-Capillary: 95 mg/dL (ref 65–99)
Glucose-Capillary: 155 mg/dL — ABNORMAL HIGH (ref 65–99)

## 2017-05-16 LAB — PATHOLOGIST SMEAR REVIEW

## 2017-05-16 MED ORDER — ALBUMIN HUMAN 25 % IV SOLN
75.0000 g | Freq: Once | INTRAVENOUS | Status: AC
  Administered 2017-05-18: 75 g via INTRAVENOUS
  Filled 2017-05-16: qty 300

## 2017-05-16 MED ORDER — SODIUM CHLORIDE 0.9 % IV SOLN
3.0000 g | Freq: Four times a day (QID) | INTRAVENOUS | Status: DC
  Administered 2017-05-16 – 2017-05-18 (×10): 3 g via INTRAVENOUS
  Filled 2017-05-16 (×11): qty 3

## 2017-05-16 MED ORDER — SODIUM CHLORIDE 0.9 % IV SOLN
INTRAVENOUS | Status: DC
  Administered 2017-05-16 – 2017-05-17 (×2): via INTRAVENOUS

## 2017-05-16 MED ORDER — ALBUMIN HUMAN 25 % IV SOLN
75.0000 g | Freq: Once | INTRAVENOUS | Status: AC
  Administered 2017-05-16: 75 g via INTRAVENOUS
  Filled 2017-05-16: qty 300

## 2017-05-16 NOTE — Consult Note (Signed)
Consultation  Referring Provider:  [facility]/Dr Bhandari Primary Care Physician:  [name], NP Primary Gastroenterologist:  Dr.Pyrtle  Reason for Consultation:  Decompensated cirrhosis, recurrent sepsis/bacteremia  HPI: Adam Butler is a 61 y.o. male , known to Dr. Hilarie Fredrickson with history of decompensated Adam Butler cirrhosis. Patient also has history of coronary artery disease status post MI in 2011 with stent to the circumflex, congestive heart failure with EF of 55%, adult-onset diabetes mellitus, and previously documented cholelithiasis. His cirrhosis has been complicated by esophageal varices, ascites, and hepatic encephalopathy. He had admission in September 2018 with diarrhea and rectal bleeding. He underwent colonoscopy on 03/23/2017 after having a negative GI pathogen panel and negative stool for C. difficile PCR. He was noted to have a redundant colon, left colon diverticulosis, a 10 mm polyp in the ascending colon which was not removed, and patchy colitis through the entire colon felt most likely infectious. Also noted to have rectal varices. He was treated empirically with vancomycin. Biopsies from the colon showed patchy ulceration and vascular congestion most consistent with an acute inflammatory process He required admission on 04/27/2017 after development of fever to 102. He was treated for sepsis but all cultures were negative. He was discharged home off antibiotics. He was readmitted on 05/13/2017, brought in by family with confusion and fever of 101. His wife says he has been taking all of his medications, including Chronulac and was having 4-5 loose bowel movements per day. She says he may have had some increase in urgency for stooling that it was hard for her to keep track of whether he was having more diarrhea than his usual Repeat CT scan on 05/13/2017 showed multiple gallstones, cirrhotic liver, there was mild pericolic cystic fluid and mild gallbladder wall  thickening with mild wall thickening of the small and large bowel and moderate ascites. He has recannulized  periumbilical vein, calcified plaque involving this. Mesenteric vein and multiple esophageal varices. Patient underwent paracentesis yesterday with removal of 1.7 L of fluid, cell counts showed 492 WBCs with 50% segs. Blood cultures are positive for 2 out of 2 Streptococcus Gallyoliticus, and 1 of 2 positive for Acinetobacter. GI pathogen panel now positive for Yersinia Patient is scheduled for TEE tomorrow.  He had been on IV antibiotics since admission-currently on imipenem-changed to Unasyn today.  Patient currently has very little in the way of complaints, he denies any abdominal discomfort, no nausea or vomiting appetite has been fine. He was encephalopathic on admission but is mentating well per family today.   Past Medical History:  Diagnosis Date  . Arthritis   . Cholelithiasis   . Cirrhosis ([place]) 2011   Cryptogenic, Likely NASH. Family/pt deny EtOH. HCV, HBV, HAV negative. ANA negative. AMA positive. Ascites 12/11  . Coronary artery disease    Inferior MI 12/11; LHC with occluded mid CFX and 80% proximal RCA. EF 55%. He had 3.0 x 28 vision BMS to CFX  . Diastolic CHF, acute ([place])    Echo 12/11 with ef 50-55% and mild LVH. EF 55% by LV0gram in 12/11  . Esophageal varices ([place]) 2011, 2013   no hx acute variceal bleed  . Hepatic encephalopathy ([place]) 2011, 12/2013  . Hyperlipemia   . Myocardial infarction [facility]) ? 2012  . PFO (patent foramen ovale): Per TEE 04/20/2015 04/20/2015  . Portal hypertension ([place])   . Rectal varices   . S/P coronary artery stent placement 06/2010  . SVT (supraventricular tachycardia) ([place])    1/12: appeared  to be an ectopic atrial tachycardia. Required DCCV with hemodynamic instability  . Type II diabetes mellitus ([place]) 2011    Past Surgical History:  Procedure Laterality Date  . APPENDECTOMY    . CARDIAC CATHETERIZATION  07/01/2010   BMS to  CFX.  Marland Kitchen CORONARY ANGIOPLASTY    . CORONARY STENT PLACEMENT  06/30/2010   CFX   Distal        . orif r leg      Prior to Admission medications   Medication Sig Start Date End Date Taking? Authorizing Provider  Aspirin-Calcium Carbonate (BAYER WOMENS) [phone] MG TABS Take 81 mg by mouth daily.   Yes [provider]  atorvastatin (LIPITOR) 10 MG tablet Take 10 mg by mouth daily.   Yes [provider]  cyanocobalamin 1000 MCG tablet Take 1,000 mcg by mouth daily. Vitamin B12   Yes [provider]  glipiZIDE (GLUCOTROL) 10 MG tablet Take 10 mg by mouth 2 (two) times daily before a meal.   Yes [provider]  Insulin Glargine (LANTUS) 100 UNIT/ML Solostar Pen Inject 8 Units into the skin daily at 10 pm. 04/30/17  Yes Allie Bossier, MD  insulin regular (NOVOLIN R,HUMULIN R) 100 units/mL injection Inject 25-80 Units into the skin 3 (three) times daily as needed for high blood sugar (CBG >150).   Yes [provider]  lactulose (CHRONULAC) 10 GM/15ML solution Take 30 mLs (20 g total) by mouth at bedtime. 04/30/17  Yes Allie Bossier, MD  Multiple Vitamin (MULTIVITAMIN WITH MINERALS) TABS tablet Take 1 tablet by mouth daily.   Yes [provider]  PARoxetine (PAXIL) 10 MG tablet Take 1 tablet (10 mg total) by mouth daily. 03/30/17  Yes [name], NP  ranitidine (ZANTAC) 150 MG tablet Take 150 mg by mouth See admin instructions. Take 1 tablet (150 mg) by mouth every morning, may also take 1 tablet at night as needed for acid reflux   Yes [provider]  spironolactone (ALDACTONE) 25 MG tablet Take 0.5 tablets (12.5 mg total) by mouth daily. 05/08/17  Yes Jettie Booze, MD  fluticasone [facility]) 50 MCG/ACT nasal spray Place 2 sprays into both nostrils 3 (three) times daily as needed for allergies or rhinitis (congestion).    [provider]  Insulin Pen Needle (PEN NEEDLES) 31G X 8 MM MISC 8 Units by Does not apply route  at bedtime. 04/30/17   Allie Bossier, MD  nitroGLYCERIN (NITROSTAT) 0.4 MG SL tablet Place 1 tablet (0.4 mg total) under the tongue every 5 (five) minutes as needed for chest pain (chest pain). 07/19/16   [name], NP    Current Facility-Administered Medications  Medication Dose Route Frequency Provider Last Rate Last Dose  . 0.9 %  sodium chloride infusion  250 mL Intravenous PRN Jani Gravel, MD      . Derrill Memo ON 05/17/2017] 0.9 %  sodium chloride infusion   Intravenous Continuous Barrett, Evelene Croon, PA-C      . Ampicillin-Sulbactam (UNASYN) 3 g in sodium chloride 0.9 % 100 mL IVPB  3 g Intravenous Q6H Pham, Anh P, RPH   Stopped at 05/16/17 1625  . aspirin EC tablet 81 mg  81 mg Oral Daily Jani Gravel, MD   81 mg at 05/16/17 1033   And  . calcium carbonate (OS-CAL - dosed in mg of elemental calcium) tablet 500 mg of elemental calcium  1 tablet Oral Daily Jani Gravel, MD   500 mg of elemental  calcium at 05/16/17 1019  . atorvastatin (LIPITOR) tablet 10 mg  10 mg Oral Daily Jani Gravel, MD   10 mg at 05/16/17 1019  . famotidine (PEPCID) tablet 10 mg  10 mg Oral BID Jani Gravel, MD   10 mg at 05/16/17 1020  . feeding supplement (PRO-STAT SUGAR FREE 64) liquid 30 mL  30 mL Oral BID Jani Gravel, MD   30 mL at 05/16/17 1033  . fluticasone (FLONASE) 50 MCG/ACT nasal spray 2 spray  2 spray Each Nare TID PRN Jani Gravel, MD      . insulin aspart (novoLOG) injection 0-5 Units  0-5 Units Subcutaneous QHS Jani Gravel, MD      . insulin aspart (novoLOG) injection 0-9 Units  0-9 Units Subcutaneous TID WC Jani Gravel, MD   1 Units at 05/16/17 1259  . insulin glargine (LANTUS) injection 8 Units  8 Units Subcutaneous QHS Jani Gravel, MD   8 Units at 05/15/17 2126  . lactulose (CHRONULAC) 10 GM/15ML solution 20 g  20 g Oral QHS Jani Gravel, MD   20 g at 05/15/17 2051  . multivitamin with minerals tablet 1 tablet  1 tablet Oral Daily Jani Gravel, MD   1 tablet at 05/16/17 1019  . nitroGLYCERIN (NITROSTAT) SL tablet 0.4  mg  0.4 mg Sublingual Q5 min PRN Jani Gravel, MD      . PARoxetine (PAXIL) tablet 10 mg  10 mg Oral Daily Jani Gravel, MD   10 mg at 05/16/17 1019  . rifaximin (XIFAXAN) tablet 550 mg  550 mg Oral BID Jani Gravel, MD   550 mg at 05/16/17 1031  . sodium chloride flush (NS) 0.9 % injection 3 mL  3 mL Intravenous Q12H Jani Gravel, MD   3 mL at 05/16/17 1033  . sodium chloride flush (NS) 0.9 % injection 3 mL  3 mL Intravenous PRN Jani Gravel, MD      . spironolactone (ALDACTONE) tablet 12.5 mg  12.5 mg Oral Daily Jani Gravel, MD   12.5 mg at 05/16/17 1020  . vitamin B-12 (CYANOCOBALAMIN) tablet 1,000 mcg  1,000 mcg Oral Daily Jani Gravel, MD   1,000 mcg at 05/16/17 1021    Allergies as of 05/12/2017 - Review Complete 05/12/2017  Allergen Reaction Noted  . Isosorbide nitrate Other (See Comments) 02/21/2015    Family History  Problem Relation Age of Onset  . Heart attack Brother 61       MI  . Diabetes Brother   . Heart attack Father   . Diabetes Father   . COPD Father   . COPD Mother   . Heart attack Brother   . Stroke Neg Hx     Social History   Socioeconomic History  . Marital status: Married    Spouse name: Not on file  . Number of children: 3  . Years of education: Not on file  . Highest education level: Not on file  Social Needs  . Financial resource strain: Not on file  . Food insecurity - worry: Not on file  . Food insecurity - inability: Not on file  . Transportation needs - medical: Not on file  . Transportation needs - non-medical: Not on file  Occupational History  . 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 Smoking status: Current Every Day Smoker    Packs/day: 0.25    Years: 36.00    Pack years: 9.00    Types:  E-cigarettes, Cigarettes  . Smokeless tobacco: Never Used  . Tobacco comment: uses vapor cigarettes (2016 ); I smoke about 4 cigarettes a day  Substance and Sexual Activity  . Alcohol use: No     Alcohol/week: 0.0 oz  . Drug use: No  . Sexual activity: No  Other Topics Concern  . Not on file  Social History Narrative   Married   Gets regular exercise: walking    Review of Systems: Gen: Denies any fever, chills, sweats, anorexia, fatigue, weakness, malaise, weight loss, and sleep disorder CV: Denies chest pain, angina, palpitations, syncope, orthopnea, PND, peripheral edema, and claudication. Resp: Denies dyspnea at rest, dyspnea with exercise, cough, sputum, wheezing, coughing up blood, and pleurisy. GI: Denies vomiting blood, jaundice, and fecal incontinence.   Denies dysphagia or odynophagia. GU : Denies urinary burning, blood in urine, urinary frequency, urinary hesitancy, nocturnal urination, and urinary incontinence. MS: Denies joint pain, limitation of movement, and swelling, stiffness, low back pain, extremity pain. Denies muscle weakness, cramps, atrophy.  Derm: Denies rash, itching, dry skin, hives, moles, warts, or unhealing ulcers.  Psych: Denies depression, anxiety, memory loss, suicidal ideation, hallucinations, paranoia, and confusion. Heme: Denies bruising, bleeding, and enlarged lymph nodes. Neuro:  Denies any headaches, dizziness, paresthesias. Endo:  Denies any problems with DM, thyroid, adrenal function.  Physical Exam: Vital signs in last 24 hours: Temp:  [97.8 F (36.6 C)-98.1 F (36.7 C)] 97.8 F (36.6 C) (11/06 0550) Pulse Rate:  [62] 62 (11/05 2054) Resp:  [17-19] 17 (11/06 0818) BP: (99-128)/(50-62) 118/55 (11/06 0818) SpO2:  [97 %-100 %] 100 % (11/06 0550) Weight:  [282 lb 3 oz (128 kg)] 282 lb 3 oz (128 kg) (11/06 0550) Last BM Date: 05/16/17 General:   Alert,  Well-developed, well-nourished, chronically ill-appearing older white male pleasant and cooperative in NAD Head:  Normocephalic and atraumatic. Eyes:  Sclera clear, no icterus.   Conjunctiva pink. Ears:  Normal auditory acuity. Nose:  No deformity, discharge,  or lesions. Mouth:  No  deformity or lesions.   Neck:  Supple; no masses or thyromegaly. Lungs:  Clear throughout to auscultation.   No wheezes, crackles, or rhonchi. Heart:  Regular rate and rhythm; no murmurs, clicks, rubs,  or gallops. Abdomen:  Soft, obese, nontender, non-tense ascites, bowel sounds are present, no palpable mass or hepatosplenomegaly  Rectal:  Deferred  Msk:  Symmetrical without gross deformities. . Pulses:  Normal pulses noted. Extremities:  1+ edema bilateral lower extremities Neurologic:  Alert and  oriented x4;  grossly normal neurologically. No asterixis, mentating normally Skin:  Intact without significant lesions or rashes.. Psych:  Alert and cooperative. Normal mood and affect.  Intake/Output from previous day: 11/05 0701 - 11/06 0700 In: 780 [P.O.:480; IV Piggyback:300] Out: -  Intake/Output this shift: No intake/output data recorded.  Lab Results: Recent Labs    05/15/17 0739  WBC 3.7*  HGB 9.8*  HCT 28.9*  PLT 50*   BMET Recent Labs    05/15/17 0739  NA 139  K 3.8  CL 109  CO2 25  GLUCOSE 105*  BUN 10  CREATININE 0.73  CALCIUM 8.1*   LFT No results for input(s): PROT, ALBUMIN, AST, ALT, ALKPHOS, BILITOT, BILIDIR, IBILI in the last 72 hours. PT/INR No results for input(s): LABPROT, INR in the last 72 hours. Hepatitis Panel No results for input(s): HEPBSAG, HCVAB, HEPAIGM, HEPBIGM in the last 72 hours.   IMPRESSION:  #37 61 year old male with decompensated Adam Butler cirrhosis with known esophageal varices, recurrent hepatic  encephalopathy, and ascites. Admitted 4 days ago with confusion and fever, and had brief admission 3 weeks ago with similar presentation and negative cultures. Patient has a strep bovis bacteremia and Acinetobacter bacteremia. I believe the source is ascites and that he has had SBP. Cell counts were borderline but patient had already been on IV antibiotics at the time of paracentesis. He has had very recent colonoscopy in September 2018 without  any evidence for colonic malignancy  #2 recent probable infectious colitis September 2018, all stool cultures negative at that time but patient now growing Vivia [place] is generally self limited  #3 recurrent hepatic encephalopathy with recent episodes precipitated by infection. Patient had previously been on Xifaxan but was unable to afford to continue, he has been on lactulose chronically #4 coronary artery disease status post MI 2011 and stent to the circumflex #5 congestive heart failure EF 50-55% #6*onset diabetes mellitus #7 cholelithiasis #8  4.1 cm thoracic aneurysm  Plan; from GI perspective with SBP needs at least 3 days of IV antibiotics, but with bacteremia, defer to ID regarding length of treatment with IV antibiotics which may be on the order of 4 weeks. After patient completes IV antibiotics will need continuous suppressive therapy with Cipro. Will give IV albumin 1.5 g/kg today and repeat again on 05/18/2017  #2 Acinetobacter is being covered by Unasyn #3 will follow-up TEE #4 Xifaxan has been restarted for hepatic encephalopathy and will try again to get this approved on an outpatient basis for continued use #5 hold beta blocker in setting of SBP as increased risk for hepatorenal syndrome and most likely permanently discontinue  Thank you we will follow with you.       Amy Esterwood  05/16/2017, 5:22 PM

## 2017-05-16 NOTE — Progress Notes (Signed)
Subjective:  No new complaints   Antibiotics:  Anti-infectives (From admission, onward)   Start     Dose/Rate Route Frequency Ordered Stop   05/16/17 1000  Ampicillin-Sulbactam (UNASYN) 3 g in sodium chloride 0.9 % 100 mL IVPB     3 g 200 mL/hr over 30 Minutes Intravenous Every 6 hours 05/16/17 0857     05/14/17 1800  imipenem-cilastatin (PRIMAXIN) 500 mg in sodium chloride 0.9 % 100 mL IVPB  Status:  Discontinued     500 mg 200 mL/hr over 30 Minutes Intravenous Every 6 hours 05/14/17 1702 05/16/17 0838   05/13/17 1000  cefTRIAXone (ROCEPHIN) 2 g in dextrose 5 % 50 mL IVPB  Status:  Discontinued     2 g 100 mL/hr over 30 Minutes Intravenous Every 24 hours 05/13/17 0851 05/14/17 1657   05/13/17 0300  piperacillin-tazobactam (ZOSYN) IVPB 3.375 g  Status:  Discontinued     3.375 g 12.5 mL/hr over 240 Minutes Intravenous Every 8 hours 05/13/17 0246 05/13/17 0844   05/12/17 2359  vancomycin (VANCOCIN) 1,250 mg in sodium chloride 0.9 % 250 mL IVPB  Status:  Discontinued     1,250 mg 166.7 mL/hr over 90 Minutes Intravenous Every 12 hours 05/12/17 2150 05/13/17 0844   05/12/17 2200  rifaximin (XIFAXAN) tablet 550 mg     550 mg Oral 2 times daily 05/12/17 1951     05/12/17 1600  piperacillin-tazobactam (ZOSYN) IVPB 3.375 g     3.375 g 100 mL/hr over 30 Minutes Intravenous  Once 05/12/17 1547 05/12/17 1623   05/12/17 1600  vancomycin (VANCOCIN) IVPB 1000 mg/200 mL premix     1,000 mg 200 mL/hr over 60 Minutes Intravenous  Once 05/12/17 1547 05/12/17 1738      Medications: Scheduled Meds: . aspirin EC  81 mg Oral Daily   And  . calcium carbonate  1 tablet Oral Daily  . atorvastatin  10 mg Oral Daily  . famotidine  10 mg Oral BID  . feeding supplement (PRO-STAT SUGAR FREE 64)  30 mL Oral BID  . insulin aspart  0-5 Units Subcutaneous QHS  . insulin aspart  0-9 Units Subcutaneous TID WC  . insulin glargine  8 Units Subcutaneous QHS  . lactulose  20 g Oral QHS  .  multivitamin with minerals  1 tablet Oral Daily  . PARoxetine  10 mg Oral Daily  . rifaximin  550 mg Oral BID  . sodium chloride flush  3 mL Intravenous Q12H  . spironolactone  12.5 mg Oral Daily  . vitamin B-12  1,000 mcg Oral Daily   Continuous Infusions: . sodium chloride    . ampicillin-sulbactam (UNASYN) IV 3 g (05/16/17 1033)   PRN Meds:.sodium chloride, fluticasone, nitroGLYCERIN, sodium chloride flush    Objective: Weight change: 0 lb (0 kg)  Intake/Output Summary (Last 24 hours) at 05/16/2017 1113 Last data filed at 05/16/2017 0026 Gross per 24 hour  Intake 540 ml  Output -  Net 540 ml   Blood pressure (!) 118/55, pulse 62, temperature 97.8 F (36.6 C), temperature source Oral, resp. rate 17, height 5' 7"  (1.702 m), weight 282 lb 3 oz (128 kg), SpO2 100 %. Temp:  [97.8 F (36.6 C)-98.1 F (36.7 C)] 97.8 F (36.6 C) (11/06 0550) Pulse Rate:  [60-62] 62 (11/05 2054) Resp:  [17-20] 17 (11/06 0818) BP: (99-130)/(50-93) 118/55 (11/06 0818) SpO2:  [97 %-100 %] 100 % (11/06 0550) Weight:  [282 lb 3 oz (128 kg)]  282 lb 3 oz (128 kg) (11/06 0550)  Physical Exam: General: Alert and awake, oriented x 3 and having conversation with Cardiology HEENT: anicteric sclera, , EOMI [facility] regular rate, Chest:no wheezing, resp distress Abdomen: soft distendedExtremities: no  clubbing or edema noted bilaterally Skin: no rashes Neuro: nonfocal  CBC:  CBC Latest Ref Rng & Units 05/15/2017 05/13/2017 05/12/2017  WBC 4.0 - 10.5 K/uL 3.7(L) 7.7 9.6  Hemoglobin 13.0 - 17.0 g/dL 9.8(L) 10.1(L) 10.5(L)  Hematocrit 39.0 - 52.0 % 28.9(L) 29.6(L) 30.0(L)  Platelets 150 - 400 K/uL 50(L) 30(L) 46(L)      BMET Recent Labs    05/15/17 0739  NA 139  K 3.8  CL 109  CO2 25  GLUCOSE 105*  BUN 10  CREATININE 0.73  CALCIUM 8.1*     Liver Panel  No results for input(s): PROT, ALBUMIN, AST, ALT, ALKPHOS, BILITOT, BILIDIR, IBILI in the last 72 hours.     Sedimentation Rate No  results for input(s): ESRSEDRATE in the last 72 hours. C-Reactive Protein No results for input(s): CRP in the last 72 hours.  Micro Results: Recent Results (from the past 720 hour(s))  Blood Culture (routine x 2)     Status: None   Collection Time: 04/27/17  6:40 PM  Result Value Ref Range Status   Specimen Description BLOOD LEFT HAND  Final   Special Requests   Final    BOTTLES DRAWN AEROBIC AND ANAEROBIC Blood Culture adequate volume   Culture NO GROWTH 5 DAYS  Final   Report Status 05/02/2017 FINAL  Final  Blood Culture (routine x 2)     Status: None   Collection Time: 04/27/17  6:55 PM  Result Value Ref Range Status   Specimen Description BLOOD BLOOD RIGHT FOREARM  Final   Special Requests IN PEDIATRIC BOTTLE Blood Culture adequate volume  Final   Culture NO GROWTH 5 DAYS  Final   Report Status 05/02/2017 FINAL  Final  Urine culture     Status: None   Collection Time: 04/27/17  8:15 PM  Result Value Ref Range Status   Specimen Description URINE, CATHETERIZED  Final   Special Requests NONE  Final   Culture NO GROWTH  Final   Report Status 04/29/2017 FINAL  Final  MRSA PCR Screening     Status: None   Collection Time: 04/27/17 11:36 PM  Result Value Ref Range Status   MRSA by PCR NEGATIVE NEGATIVE Final    Comment:        The GeneXpert MRSA Assay (FDA approved for NASAL specimens only), is one component of a comprehensive MRSA colonization surveillance program. It is not intended to diagnose MRSA infection nor to guide or monitor treatment for MRSA infections.   Respiratory Panel by PCR     Status: None   Collection Time: 04/28/17  9:58 AM  Result Value Ref Range Status   Adenovirus NOT DETECTED NOT DETECTED Final   Coronavirus 229E NOT DETECTED NOT DETECTED Final   Coronavirus HKU1 NOT DETECTED NOT DETECTED Final   Coronavirus NL63 NOT DETECTED NOT DETECTED Final   Coronavirus OC43 NOT DETECTED NOT DETECTED Final   Metapneumovirus NOT DETECTED NOT DETECTED Final     Rhinovirus / Enterovirus NOT DETECTED NOT DETECTED Final   Influenza A NOT DETECTED NOT DETECTED Final   Influenza B NOT DETECTED NOT DETECTED Final   Parainfluenza Virus 1 NOT DETECTED NOT DETECTED Final   Parainfluenza Virus 2 NOT DETECTED NOT DETECTED Final   Parainfluenza Virus 3  NOT DETECTED NOT DETECTED Final   Parainfluenza Virus 4 NOT DETECTED NOT DETECTED Final   Respiratory Syncytial Virus NOT DETECTED NOT DETECTED Final   Bordetella pertussis NOT DETECTED NOT DETECTED Final   Chlamydophila pneumoniae NOT DETECTED NOT DETECTED Final   Mycoplasma pneumoniae NOT DETECTED NOT DETECTED Final  Culture, blood (Routine x 2)     Status: Abnormal   Collection Time: 05/12/17  3:03 PM  Result Value Ref Range Status   Specimen Description BLOOD RIGHT HAND  Final   Special Requests   Final    BOTTLES DRAWN AEROBIC AND ANAEROBIC Blood Culture adequate volume   Culture  Setup Time   Final    GRAM POSITIVE COCCI IN PAIRS AND CHAINS IN BOTH AEROBIC AND ANAEROBIC BOTTLES CRITICAL RESULT CALLED TO, READ BACK BY AND VERIFIED WITH: N GLOGOVAC,PHARMD AT 0725 05/13/17 BY L BENFIELD Performed at [facility], [place] [address] [place], [place] [address]    Culture STREPTOCOCCUS GALLOLYTICUS (A)  Final   Report Status 05/15/2017 FINAL  Final   Organism ID, Bacteria STREPTOCOCCUS GALLOLYTICUS  Final      Susceptibility   Streptococcus gallolyticus - MIC*    PENICILLIN 0.12 SENSITIVE Sensitive     CEFTRIAXONE 0.25 SENSITIVE Sensitive     ERYTHROMYCIN <=0.12 SENSITIVE Sensitive     LEVOFLOXACIN 4 INTERMEDIATE Intermediate     VANCOMYCIN 0.25 SENSITIVE Sensitive     * STREPTOCOCCUS GALLOLYTICUS  Blood Culture ID Panel (Reflexed)     Status: Abnormal   Collection Time: 05/12/17  3:03 PM  Result Value Ref Range Status   Enterococcus species NOT DETECTED NOT DETECTED Final   Listeria monocytogenes NOT DETECTED NOT DETECTED Final   Staphylococcus species NOT DETECTED NOT DETECTED Final    Staphylococcus aureus NOT DETECTED NOT DETECTED Final   Streptococcus species DETECTED (A) NOT DETECTED Final    Comment: Not Enterococcus species, Streptococcus agalactiae, Streptococcus pyogenes, or Streptococcus pneumoniae. CRITICAL RESULT CALLED TO, READ BACK BY AND VERIFIED WITH: N GLOGOVAC,PHARMD AT 0725 05/13/17 BY L BENFIELD    Streptococcus agalactiae NOT DETECTED NOT DETECTED Final   Streptococcus pneumoniae NOT DETECTED NOT DETECTED Final   Streptococcus pyogenes NOT DETECTED NOT DETECTED Final   Acinetobacter baumannii NOT DETECTED NOT DETECTED Final   Enterobacteriaceae species NOT DETECTED NOT DETECTED Final   Enterobacter cloacae complex NOT DETECTED NOT DETECTED Final   Escherichia coli NOT DETECTED NOT DETECTED Final   Klebsiella oxytoca NOT DETECTED NOT DETECTED Final   Klebsiella pneumoniae NOT DETECTED NOT DETECTED Final   Proteus species NOT DETECTED NOT DETECTED Final   Serratia marcescens NOT DETECTED NOT DETECTED Final   Haemophilus influenzae NOT DETECTED NOT DETECTED Final   Neisseria meningitidis NOT DETECTED NOT DETECTED Final   Pseudomonas aeruginosa NOT DETECTED NOT DETECTED Final   Candida albicans NOT DETECTED NOT DETECTED Final   Candida glabrata NOT DETECTED NOT DETECTED Final   Candida krusei NOT DETECTED NOT DETECTED Final   Candida parapsilosis NOT DETECTED NOT DETECTED Final   Candida tropicalis NOT DETECTED NOT DETECTED Final    Comment: Performed at [facility], [place]. [address], [place], [place] [address]  Culture, blood (Routine x 2)     Status: Abnormal   Collection Time: 05/12/17  3:55 PM  Result Value Ref Range Status   Specimen Description BLOOD LEFT HAND  Final   Special Requests   Final    BOTTLES DRAWN AEROBIC AND ANAEROBIC Blood Culture adequate volume   Culture  Setup Time  Final    GRAM POSITIVE COCCI IN PAIRS AND CHAINS IN BOTH AEROBIC AND ANAEROBIC BOTTLES CRITICAL VALUE NOTED.  VALUE IS CONSISTENT WITH PREVIOUSLY REPORTED  AND CALLED VALUE.    Culture (A)  Final    STREPTOCOCCUS GALLOLYTICUS ACINETOBACTER CALCOACETICUS/BAUMANNII COMPLEX CRITICAL RESULT CALLED TO, READ BACK BY AND VERIFIED WITH: J GADHIA,PHARMD AT 1010 05/14/17 BY L BENFIELD CONCERNING GROWTH ON CULTURE SUSCEPTIBILITIES PERFORMED ON PREVIOUS CULTURE WITHIN THE LAST 5 DAYS. ON STREPTOCOCCUS GALLOLYTICUS Performed at [facility], [place] [address], [place], [place] [id number]    Report Status 05/15/2017 FINAL  Final   Organism ID, Bacteria ACINETOBACTER CALCOACETICUS/BAUMANNII COMPLEX  Final      Susceptibility   Acinetobacter calcoaceticus/baumannii complex - MIC*    CEFTAZIDIME 4 SENSITIVE Sensitive     CEFTRIAXONE 16 INTERMEDIATE Intermediate     CIPROFLOXACIN <=0.25 SENSITIVE Sensitive     GENTAMICIN <=1 SENSITIVE Sensitive     IMIPENEM <=0.25 SENSITIVE Sensitive     PIP/TAZO 32 INTERMEDIATE Intermediate     TRIMETH/SULFA 160 RESISTANT Resistant     CEFEPIME 4 SENSITIVE Sensitive     AMPICILLIN/SULBACTAM <=2 SENSITIVE Sensitive     * ACINETOBACTER CALCOACETICUS/BAUMANNII COMPLEX  Respiratory Panel by PCR     Status: None   Collection Time: 05/12/17  7:58 PM  Result Value Ref Range Status   Adenovirus NOT DETECTED NOT DETECTED Final   Coronavirus 229E NOT DETECTED NOT DETECTED Final   Coronavirus HKU1 NOT DETECTED NOT DETECTED Final   Coronavirus NL63 NOT DETECTED NOT DETECTED Final   Coronavirus OC43 NOT DETECTED NOT DETECTED Final   Metapneumovirus NOT DETECTED NOT DETECTED Final   Rhinovirus / Enterovirus NOT DETECTED NOT DETECTED Final   Influenza A NOT DETECTED NOT DETECTED Final   Influenza B NOT DETECTED NOT DETECTED Final   Parainfluenza Virus 1 NOT DETECTED NOT DETECTED Final   Parainfluenza Virus 2 NOT DETECTED NOT DETECTED Final   Parainfluenza Virus 3 NOT DETECTED NOT DETECTED Final   Parainfluenza Virus 4 NOT DETECTED NOT DETECTED Final   Respiratory Syncytial Virus NOT DETECTED NOT DETECTED Final   Bordetella  pertussis NOT DETECTED NOT DETECTED Final   Chlamydophila pneumoniae NOT DETECTED NOT DETECTED Final   Mycoplasma pneumoniae NOT DETECTED NOT DETECTED Final    Comment: Performed at [facility], [place] [address] [place], [place] [address]  Gastrointestinal Panel by PCR , Stool     Status: Abnormal   Collection Time: 05/12/17  7:58 PM  Result Value Ref Range Status   Campylobacter species NOT DETECTED NOT DETECTED Final   Plesimonas shigelloides NOT DETECTED NOT DETECTED Final   Salmonella species NOT DETECTED NOT DETECTED Final   Yersinia enterocolitica DETECTED (A) NOT DETECTED Final    Comment: RESULT CALLED TO, READ BACK BY AND VERIFIED WITH:  Randa Lynn AT 0624 05/14/17 SDR    Vibrio species NOT DETECTED NOT DETECTED Final   Vibrio cholerae NOT DETECTED NOT DETECTED Final   Enteroaggregative E coli (EAEC) NOT DETECTED NOT DETECTED Final   Enteropathogenic E coli (EPEC) NOT DETECTED NOT DETECTED Final   Enterotoxigenic E coli (ETEC) NOT DETECTED NOT DETECTED Final   Shiga like toxin producing E coli (STEC) NOT DETECTED NOT DETECTED Final   Shigella/Enteroinvasive E coli (EIEC) NOT DETECTED NOT DETECTED Final   Cryptosporidium NOT DETECTED NOT DETECTED Final   Cyclospora cayetanensis NOT DETECTED NOT DETECTED Final   Entamoeba histolytica NOT DETECTED NOT DETECTED Final   Giardia lamblia NOT DETECTED NOT DETECTED Final  Adenovirus F40/41 NOT DETECTED NOT DETECTED Final   Astrovirus NOT DETECTED NOT DETECTED Final   Norovirus GI/GII NOT DETECTED NOT DETECTED Final   Rotavirus A NOT DETECTED NOT DETECTED Final   Sapovirus (I, II, IV, and V) NOT DETECTED NOT DETECTED Final  Culture, blood (routine x 2)     Status: None (Preliminary result)   Collection Time: 05/14/17 11:42 AM  Result Value Ref Range Status   Specimen Description BLOOD RIGHT ANTECUBITAL  Final   Special Requests   Final    BOTTLES DRAWN AEROBIC ONLY Blood Culture adequate volume   Culture   Final    NO  GROWTH < 24 HOURS Performed at [facility], [address] [place], [place] [address]    Report Status PENDING  Incomplete  Culture, blood (routine x 2)     Status: None (Preliminary result)   Collection Time: 05/14/17 11:42 AM  Result Value Ref Range Status   Specimen Description BLOOD BLOOD RIGHT FOREARM  Final   Special Requests IN PEDIATRIC BOTTLE Blood Culture adequate volume  Final   Culture   Final    NO GROWTH < 24 HOURS Performed at [facility], [place] [address], [place], [place] [address]    Report Status PENDING  Incomplete  Gram stain     Status: None   Collection Time: 05/15/17  4:07 PM  Result Value Ref Range Status   Specimen Description PERITONEAL  Final   Special Requests NONE  Final   Gram Stain   Final    WBC PRESENT, PREDOMINANTLY PMN NO ORGANISMS SEEN CYTOSPIN SMEAR Performed at [facility], [place] [address] [place], [place] [address]    Report Status 05/16/2017 FINAL  Final    Studies/Results: US Paracentesis  Result Date: 05/15/2017 INDICATION: Patient with abdominal distention and ascites. For diagnostic and therapeutic paracentesis. EXAM: ULTRASOUND GUIDED DIAGNOSTIC AND THERAPEUTIC PARACENTESIS MEDICATIONS: 1% lidocaine COMPLICATIONS: None immediate. PROCEDURE: Informed written consent was obtained from the patient after a discussion of the risks, benefits and alternatives to treatment. A timeout was performed prior to the initiation of the procedure. Initial ultrasound scanning demonstrates a large amount of ascites within the left lateral abdomen. The left lateral abdomen was prepped and draped in the usual sterile fashion. 1% lidocaine was used for local anesthesia. Following this, a 19 gauge, 7-cm, Yueh catheter was introduced. An ultrasound image was saved for documentation purposes. The paracentesis was performed. The catheter was removed and a dressing was applied. The patient tolerated the procedure well without immediate post  procedural complication. FINDINGS: A total of approximately 1.7 of hazy, yellow fluid was removed. Samples were sent to the laboratory as requested by the clinical team. IMPRESSION: Successful ultrasound-guided diagnostic and therapeutic paracentesis yielding 1.7 liters of peritoneal fluid. Read by:  Brynda Greathouse PA-C Electronically Signed   By: Lucrezia Europe M.D.   On: 05/15/2017 16:43      Assessment/Plan:  INTERVAL HISTORY: pt now sp TTE and US guided paracentesis   Principal Problem:   Fever Active Problems:   Bacteremia   Liver cirrhosis secondary to NASH (HCC)   Hypokalemia   Anemia   Diabetes mellitus type 2, uncontrolled, with complications (HCC)   Thrombocytopenia (HCC)   Ascites   Hyperammonemia (HCC)   Diarrhea of presumed infectious origin   Infection due to acinetobacter baumannii   Streptococcus bovis infection   Acute bacterial endocarditis    Adam Butler is a 60 y.o. male with NASH,  cirrhosis with admission with hepatic encephalopathy with blood cultures with Streptococcus gallyolyticus in 2/2 blood cultures and acinetobacter in 1/2 blood cultures now sp paracentesis.  #1 Streptococcus gallyolyticus (bovis) bacteremia:   This organism is NOTORIOUS for causing endocarditis  TTE does not show endocarditis but not sensitive enough  He is going for TEE tomorrow  Based on AHA guidelines his MIC to PCN is = 0.12 which just barely puts his S bovis in the "very S to PCN" category. AMP is also listed as option here and AMP/SUL would also cover his Acinetobacter  I will change him to Unasyn  Will also discuss with ID pharmacy  He will likely need 4 weeks of IV abx at home with wife if they can handle this  Source is likely intra-abdominal   He states he HAD colonoscopy one month ago. GI MD (Dr Hilarie Fredrickson) Needs to be made aware of the S bovis diagnosis   #2 Acinetobacter in blood cultures: is S to IMIPENEM as well as ceftazidime, unasyn  I am going to cover  it for now though I dont want to compromise optimal rx for the S bovis  #3 ? SBP: PMNS not quite meeting criteria but this was also after abx  #4 NASH and pancytopenia: defer to primary team and hepatology               LOS: 4 days   Alcide Evener 05/16/2017, 11:13 AM

## 2017-05-16 NOTE — Progress Notes (Addendum)
PROGRESS NOTE    Adam Butler  VOZ:[id number] DOB: [birth year] DOA: 05/12/2017 PCP: [name], NP   Brief Narrative:   Assessment & Plan:   # Streptococcal/ Acinetobacter bacteremia likely GI source: -Follow up with repeat blood culture. -Infectious disease consult note from 11/05 reviewed. TTE with no vegetation. Cardiology consulted for TEE and GI was consulted for evaluation for colonoscopy. I have discussed above test with the patient at bedside and he verbalized understanding. He wanted to go home however I explained to him the importance of proceding these tests to help plan for long-term antibiotics. Later he verbalized understanding.  -changed to IV unasyn by ID -Clinically improving. -Follow up ID for long-term antibiotics plan.  #Acute hepatitic encephalopathy: Continue lactulose. Mental status improved.  #History of liver cirrhosis with esophageal varices: Continue Xifaxan, Aldactone. ? SBP. On antibiotics  # Infectious diarrhea: GI pathogen growing Yersinia: Follow up ID.  #Type 2 diabetes: Continue current insulin regimen. Monitor blood sugar level.  #Chronic Thrombocytopenia: Platelet BP. No sign of bleeding. Repeat lab in the morning.  DVT prophylaxis: SCD Code Status: Full code Family Communication: No family at bedside Disposition Plan: Currently admitted.    Consultants:   Infectious disease  Procedures: None Antimicrobials: Imipenem, switched to unasyn on 11/6  Subjective: Seen and examined at bedside. Patient wanted to go home today. I explained to him about current clinical condition and plan of care including bacteremia and importance of recording further testing including TEE and possibly colonoscopy. Letter he verbalized understanding. The nursing student Margreta Journey was at bedside during my conversation with the patient. Objective: Vitals:   05/14/17 2126 05/14/17 2300 05/15/17 0500 05/15/17 0525  BP: (!) 103/41 (!) 111/52  (!) 117/48    Pulse: 63   65  Resp: 17   17  Temp: 98.3 F (36.8 C)   98.2 F (36.8 C)  TempSrc: Oral   Oral  SpO2: 99%   96%  Weight:   128 kg (282 lb 3 oz)   Height:        Intake/Output Summary (Last 24 hours) at 05/15/2017 1026 Last data filed at 05/15/2017 0200 Gross per 24 hour  Intake 440 ml  Output -  Net 440 ml   Filed Weights   05/12/17 2046 05/14/17 0607 05/15/17 0500  Weight: 130.4 kg (287 lb 7.7 oz) 128.6 kg (283 lb 9.6 oz) 128 kg (282 lb 3 oz)    Examination:  General exam: Sitting on chair comfortable Respiratory system: Clear bilateral, respiratory effort normal  Cardiovascular system: Regular rate rhythm, S1-S2 normal. No pedal edema Gastrointestinal system: Abdomen soft, nontender. Normal bowel sound Central nervous system: Alert and oriented. No focal neurological deficits. Skin: No rashes, lesions or ulcers Psychiatry: Judgement and insight appear normal. Mood & affect appropriate.     Data Reviewed: I have personally reviewed following labs and imaging studies  CBC: Recent Labs  Lab 05/12/17 1502 05/13/17 0533 05/15/17 0739  WBC 9.6 7.7 3.7*  NEUTROABS 7.7  --   --   HGB 10.5* 10.1* 9.8*  HCT 30.0* 29.6* 28.9*  MCV 98.4 100.0 99.0  PLT 46* 30* 50*   Basic Metabolic Panel: Recent Labs  Lab 05/12/17 1502 05/12/17 1609 05/13/17 0533 05/15/17 0739  NA 134*  --  138 139  K 2.8*  --  3.6 3.8  CL 104  --  107 109  CO2 24  --  25 25  GLUCOSE 180*  --  107* 105*  BUN 7  --  8 10  CREATININE 0.81  --  0.65 0.73  CALCIUM 7.1*  --  7.2* 8.1*  MG  --  1.3*  --   --    GFR: Estimated Creatinine Clearance: 124.7 mL/min (by C-G formula based on SCr of 0.73 mg/dL). Liver Function Tests: Recent Labs  Lab 05/12/17 1502 05/13/17 0533  AST 66* 63*  ALT 39 34  ALKPHOS 125 94  BILITOT 2.4* 3.4*  PROT 6.0* 5.6*  ALBUMIN 2.1* 1.9*   No results for input(s): LIPASE, AMYLASE in the last 168 hours. Recent Labs  Lab 05/12/17 1552 05/13/17 0533  AMMONIA  80* 55*   Coagulation Profile: Recent Labs  Lab 05/12/17 1502  INR 1.76   Cardiac Enzymes: No results for input(s): CKTOTAL, CKMB, CKMBINDEX, TROPONINI in the last 168 hours. BNP (last 3 results) No results for input(s): PROBNP in the last 8760 hours. HbA1C: No results for input(s): HGBA1C in the last 72 hours. CBG: Recent Labs  Lab 05/14/17 0756 05/14/17 1134 05/14/17 1720 05/14/17 2118 05/15/17 0750  GLUCAP 84 197* 210* 186* 99   Lipid Profile: No results for input(s): CHOL, HDL, LDLCALC, TRIG, CHOLHDL, LDLDIRECT in the last 72 hours. Thyroid Function Tests: No results for input(s): TSH, T4TOTAL, FREET4, T3FREE, THYROIDAB in the last 72 hours. Anemia Panel: No results for input(s): VITAMINB12, FOLATE, FERRITIN, TIBC, IRON, RETICCTPCT in the last 72 hours. Sepsis Labs: Recent Labs  Lab 05/12/17 1508 05/12/17 1705  LATICACIDVEN 2.01* 1.34    Recent Results (from the past 240 hour(s))  Culture, blood (Routine x 2)     Status: Abnormal   Collection Time: 05/12/17  3:03 PM  Result Value Ref Range Status   Specimen Description BLOOD RIGHT HAND  Final   Special Requests   Final    BOTTLES DRAWN AEROBIC AND ANAEROBIC Blood Culture adequate volume   Culture  Setup Time   Final    GRAM POSITIVE COCCI IN PAIRS AND CHAINS IN BOTH AEROBIC AND ANAEROBIC BOTTLES CRITICAL RESULT CALLED TO, READ BACK BY AND VERIFIED WITH: N GLOGOVAC,PHARMD AT 0725 05/13/17 BY L BENFIELD Performed at [facility], [place] [address] [place], [place] [address]    Culture STREPTOCOCCUS GALLOLYTICUS (A)  Final   Report Status 05/15/2017 FINAL  Final   Organism ID, Bacteria STREPTOCOCCUS GALLOLYTICUS  Final      Susceptibility   Streptococcus gallolyticus - MIC*    PENICILLIN 0.12 SENSITIVE Sensitive     CEFTRIAXONE 0.25 SENSITIVE Sensitive     ERYTHROMYCIN <=0.12 SENSITIVE Sensitive     LEVOFLOXACIN 4 INTERMEDIATE Intermediate     VANCOMYCIN 0.25 SENSITIVE Sensitive     * STREPTOCOCCUS  GALLOLYTICUS  Blood Culture ID Panel (Reflexed)     Status: Abnormal   Collection Time: 05/12/17  3:03 PM  Result Value Ref Range Status   Enterococcus species NOT DETECTED NOT DETECTED Final   Listeria monocytogenes NOT DETECTED NOT DETECTED Final   Staphylococcus species NOT DETECTED NOT DETECTED Final   Staphylococcus aureus NOT DETECTED NOT DETECTED Final   Streptococcus species DETECTED (A) NOT DETECTED Final    Comment: Not Enterococcus species, Streptococcus agalactiae, Streptococcus pyogenes, or Streptococcus pneumoniae. CRITICAL RESULT CALLED TO, READ BACK BY AND VERIFIED WITH: N GLOGOVAC,PHARMD AT 0725 05/13/17 BY L BENFIELD    Streptococcus agalactiae NOT DETECTED NOT DETECTED Final   Streptococcus pneumoniae NOT DETECTED NOT DETECTED Final   Streptococcus pyogenes NOT DETECTED NOT DETECTED Final   Acinetobacter baumannii NOT DETECTED NOT DETECTED Final   Enterobacteriaceae  species NOT DETECTED NOT DETECTED Final   Enterobacter cloacae complex NOT DETECTED NOT DETECTED Final   Escherichia coli NOT DETECTED NOT DETECTED Final   Klebsiella oxytoca NOT DETECTED NOT DETECTED Final   Klebsiella pneumoniae NOT DETECTED NOT DETECTED Final   Proteus species NOT DETECTED NOT DETECTED Final   Serratia marcescens NOT DETECTED NOT DETECTED Final   Haemophilus influenzae NOT DETECTED NOT DETECTED Final   Neisseria meningitidis NOT DETECTED NOT DETECTED Final   Pseudomonas aeruginosa NOT DETECTED NOT DETECTED Final   Candida albicans NOT DETECTED NOT DETECTED Final   Candida glabrata NOT DETECTED NOT DETECTED Final   Candida krusei NOT DETECTED NOT DETECTED Final   Candida parapsilosis NOT DETECTED NOT DETECTED Final   Candida tropicalis NOT DETECTED NOT DETECTED Final    Comment: Performed at [facility], [place] [address] [place], [place] [address]  Culture, blood (Routine x 2)     Status: Abnormal   Collection Time: 05/12/17  3:55 PM  Result Value Ref Range Status   Specimen  Description BLOOD LEFT HAND  Final   Special Requests   Final    BOTTLES DRAWN AEROBIC AND ANAEROBIC Blood Culture adequate volume   Culture  Setup Time   Final    GRAM POSITIVE COCCI IN PAIRS AND CHAINS IN BOTH AEROBIC AND ANAEROBIC BOTTLES CRITICAL VALUE NOTED.  VALUE IS CONSISTENT WITH PREVIOUSLY REPORTED AND CALLED VALUE.    Culture (A)  Final    STREPTOCOCCUS GALLOLYTICUS ACINETOBACTER CALCOACETICUS/BAUMANNII COMPLEX CRITICAL RESULT CALLED TO, READ BACK BY AND VERIFIED WITH: J GADHIA,PHARMD AT 1010 05/14/17 BY L BENFIELD CONCERNING GROWTH ON CULTURE SUSCEPTIBILITIES PERFORMED ON PREVIOUS CULTURE WITHIN THE LAST 5 DAYS. ON STREPTOCOCCUS GALLOLYTICUS Performed at [facility], [place] [address], [place], [place] [address]    Report Status 05/15/2017 FINAL  Final   Organism ID, Bacteria ACINETOBACTER CALCOACETICUS/BAUMANNII COMPLEX  Final      Susceptibility   Acinetobacter calcoaceticus/baumannii complex - MIC*    CEFTAZIDIME 4 SENSITIVE Sensitive     CEFTRIAXONE 16 INTERMEDIATE Intermediate     CIPROFLOXACIN <=0.25 SENSITIVE Sensitive     GENTAMICIN <=1 SENSITIVE Sensitive     IMIPENEM <=0.25 SENSITIVE Sensitive     PIP/TAZO 32 INTERMEDIATE Intermediate     TRIMETH/SULFA 160 RESISTANT Resistant     CEFEPIME 4 SENSITIVE Sensitive     AMPICILLIN/SULBACTAM <=2 SENSITIVE Sensitive     * ACINETOBACTER CALCOACETICUS/BAUMANNII COMPLEX  Respiratory Panel by PCR     Status: None   Collection Time: 05/12/17  7:58 PM  Result Value Ref Range Status   Adenovirus NOT DETECTED NOT DETECTED Final   Coronavirus 229E NOT DETECTED NOT DETECTED Final   Coronavirus HKU1 NOT DETECTED NOT DETECTED Final   Coronavirus NL63 NOT DETECTED NOT DETECTED Final   Coronavirus OC43 NOT DETECTED NOT DETECTED Final   Metapneumovirus NOT DETECTED NOT DETECTED Final   Rhinovirus / Enterovirus NOT DETECTED NOT DETECTED Final   Influenza A NOT DETECTED NOT DETECTED Final   Influenza B NOT DETECTED NOT  DETECTED Final   Parainfluenza Virus 1 NOT DETECTED NOT DETECTED Final   Parainfluenza Virus 2 NOT DETECTED NOT DETECTED Final   Parainfluenza Virus 3 NOT DETECTED NOT DETECTED Final   Parainfluenza Virus 4 NOT DETECTED NOT DETECTED Final   Respiratory Syncytial Virus NOT DETECTED NOT DETECTED Final   Bordetella pertussis NOT DETECTED NOT DETECTED Final   Chlamydophila pneumoniae NOT DETECTED NOT DETECTED Final   Mycoplasma pneumoniae NOT DETECTED NOT DETECTED Final  Comment: Performed at [facility], [place] [address] [place], [place] [address]  Gastrointestinal Panel by PCR , Stool     Status: Abnormal   Collection Time: 05/12/17  7:58 PM  Result Value Ref Range Status   Campylobacter species NOT DETECTED NOT DETECTED Final   Plesimonas shigelloides NOT DETECTED NOT DETECTED Final   Salmonella species NOT DETECTED NOT DETECTED Final   Yersinia enterocolitica DETECTED (A) NOT DETECTED Final    Comment: RESULT CALLED TO, READ BACK BY AND VERIFIED WITH:  Randa Lynn AT 0624 05/14/17 SDR    Vibrio species NOT DETECTED NOT DETECTED Final   Vibrio cholerae NOT DETECTED NOT DETECTED Final   Enteroaggregative E coli (EAEC) NOT DETECTED NOT DETECTED Final   Enteropathogenic E coli (EPEC) NOT DETECTED NOT DETECTED Final   Enterotoxigenic E coli (ETEC) NOT DETECTED NOT DETECTED Final   Shiga like toxin producing E coli (STEC) NOT DETECTED NOT DETECTED Final   Shigella/Enteroinvasive E coli (EIEC) NOT DETECTED NOT DETECTED Final   Cryptosporidium NOT DETECTED NOT DETECTED Final   Cyclospora cayetanensis NOT DETECTED NOT DETECTED Final   Entamoeba histolytica NOT DETECTED NOT DETECTED Final   Giardia lamblia NOT DETECTED NOT DETECTED Final   Adenovirus F40/41 NOT DETECTED NOT DETECTED Final   Astrovirus NOT DETECTED NOT DETECTED Final   Norovirus GI/GII NOT DETECTED NOT DETECTED Final   Rotavirus A NOT DETECTED NOT DETECTED Final   Sapovirus (I, II, IV, and V) NOT DETECTED NOT  DETECTED Final         Radiology Studies: No results found.      Scheduled Meds: . aspirin EC  81 mg Oral Daily   And  . calcium carbonate  1 tablet Oral Daily  . atorvastatin  10 mg Oral Daily  . famotidine  10 mg Oral BID  . feeding supplement (PRO-STAT SUGAR FREE 64)  30 mL Oral BID  . insulin aspart  0-5 Units Subcutaneous QHS  . insulin aspart  0-9 Units Subcutaneous TID WC  . insulin glargine  8 Units Subcutaneous QHS  . lactulose  20 g Oral QHS  . multivitamin with minerals  1 tablet Oral Daily  . PARoxetine  10 mg Oral Daily  . rifaximin  550 mg Oral BID  . sodium chloride flush  3 mL Intravenous Q12H  . spironolactone  12.5 mg Oral Daily  . vitamin B-12  1,000 mcg Oral Daily   Continuous Infusions: . sodium chloride    . albumin human    . imipenem-cilastatin Stopped (05/15/17 0552)     LOS: 3 days    Dron Tanna Furry, MD [facility] Pager [phone]  If 7PM-7AM, please contact night-coverage [email or web address] Password [email or web address] 05/15/2017, 10:26 AM

## 2017-05-16 NOTE — Consult Note (Addendum)
   [facility] CM Inpatient Consult   05/16/2017  Adam Butler 04-23-1956 [id number]    Spoke with Adam Butler at bedside to discuss re-engaging with [facility] [employer] [facility]. Adam Butler was active in the past. He has had multiple hospitalizations.  Adam Butler declines [facility] [employer] program services at this time. He states he does not need the additional assistance for medication management, community resources, or disease management and education.  [facility] Care Management brochure with contact information and 24-hr nurse line magnet left at bedside for Adam Butler to contact in future if needed.  Of note, [facility] office is listed as performing post hospital discharge calls.   Will make inpatient RNCM aware of above notes.    Marthenia Rolling, MSN-Ed, RN,BSN [facility] Liaison [phone]

## 2017-05-16 NOTE — H&P (View-Only) (Signed)
Subjective:  No new complaints   Antibiotics:  Anti-infectives (From admission, onward)   Start     Dose/Rate Route Frequency Ordered Stop   05/16/17 1000  Ampicillin-Sulbactam (UNASYN) 3 g in sodium chloride 0.9 % 100 mL IVPB     3 g 200 mL/hr over 30 Minutes Intravenous Every 6 hours 05/16/17 0857     05/14/17 1800  imipenem-cilastatin (PRIMAXIN) 500 mg in sodium chloride 0.9 % 100 mL IVPB  Status:  Discontinued     500 mg 200 mL/hr over 30 Minutes Intravenous Every 6 hours 05/14/17 1702 05/16/17 0838   05/13/17 1000  cefTRIAXone (ROCEPHIN) 2 g in dextrose 5 % 50 mL IVPB  Status:  Discontinued     2 g 100 mL/hr over 30 Minutes Intravenous Every 24 hours 05/13/17 0851 05/14/17 1657   05/13/17 0300  piperacillin-tazobactam (ZOSYN) IVPB 3.375 g  Status:  Discontinued     3.375 g 12.5 mL/hr over 240 Minutes Intravenous Every 8 hours 05/13/17 0246 05/13/17 0844   05/12/17 2359  vancomycin (VANCOCIN) 1,250 mg in sodium chloride 0.9 % 250 mL IVPB  Status:  Discontinued     1,250 mg 166.7 mL/hr over 90 Minutes Intravenous Every 12 hours 05/12/17 2150 05/13/17 0844   05/12/17 2200  rifaximin (XIFAXAN) tablet 550 mg     550 mg Oral 2 times daily 05/12/17 1951     05/12/17 1600  piperacillin-tazobactam (ZOSYN) IVPB 3.375 g     3.375 g 100 mL/hr over 30 Minutes Intravenous  Once 05/12/17 1547 05/12/17 1623   05/12/17 1600  vancomycin (VANCOCIN) IVPB 1000 mg/200 mL premix     1,000 mg 200 mL/hr over 60 Minutes Intravenous  Once 05/12/17 1547 05/12/17 1738      Medications: Scheduled Meds: . aspirin EC  81 mg Oral Daily   And  . calcium carbonate  1 tablet Oral Daily  . atorvastatin  10 mg Oral Daily  . famotidine  10 mg Oral BID  . feeding supplement (PRO-STAT SUGAR FREE 64)  30 mL Oral BID  . insulin aspart  0-5 Units Subcutaneous QHS  . insulin aspart  0-9 Units Subcutaneous TID WC  . insulin glargine  8 Units Subcutaneous QHS  . lactulose  20 g Oral QHS  .  multivitamin with minerals  1 tablet Oral Daily  . PARoxetine  10 mg Oral Daily  . rifaximin  550 mg Oral BID  . sodium chloride flush  3 mL Intravenous Q12H  . spironolactone  12.5 mg Oral Daily  . vitamin B-12  1,000 mcg Oral Daily   Continuous Infusions: . sodium chloride    . ampicillin-sulbactam (UNASYN) IV 3 g (05/16/17 1033)   PRN Meds:.sodium chloride, fluticasone, nitroGLYCERIN, sodium chloride flush    Objective: Weight change: 0 lb (0 kg)  Intake/Output Summary (Last 24 hours) at 05/16/2017 1113 Last data filed at 05/16/2017 0026 Gross per 24 hour  Intake 540 ml  Output -  Net 540 ml   Blood pressure (!) 118/55, pulse 62, temperature 97.8 F (36.6 C), temperature source Oral, resp. rate 17, height 5' 7"  (1.702 m), weight 282 lb 3 oz (128 kg), SpO2 100 %. Temp:  [97.8 F (36.6 C)-98.1 F (36.7 C)] 97.8 F (36.6 C) (11/06 0550) Pulse Rate:  [60-62] 62 (11/05 2054) Resp:  [17-20] 17 (11/06 0818) BP: (99-130)/(50-93) 118/55 (11/06 0818) SpO2:  [97 %-100 %] 100 % (11/06 0550) Weight:  [282 lb 3 oz (128 kg)]  282 lb 3 oz (128 kg) (11/06 0550)  Physical Exam: General: Alert and awake, oriented x 3 and having conversation with Cardiology HEENT: anicteric sclera, , EOMI [facility] regular rate, Chest:no wheezing, resp distress Abdomen: soft distendedExtremities: no  clubbing or edema noted bilaterally Skin: no rashes Neuro: nonfocal  CBC:  CBC Latest Ref Rng & Units 05/15/2017 05/13/2017 05/12/2017  WBC 4.0 - 10.5 K/uL 3.7(L) 7.7 9.6  Hemoglobin 13.0 - 17.0 g/dL 9.8(L) 10.1(L) 10.5(L)  Hematocrit 39.0 - 52.0 % 28.9(L) 29.6(L) 30.0(L)  Platelets 150 - 400 K/uL 50(L) 30(L) 46(L)      BMET Recent Labs    05/15/17 0739  NA 139  K 3.8  CL 109  CO2 25  GLUCOSE 105*  BUN 10  CREATININE 0.73  CALCIUM 8.1*     Liver Panel  No results for input(s): PROT, ALBUMIN, AST, ALT, ALKPHOS, BILITOT, BILIDIR, IBILI in the last 72 hours.     Sedimentation Rate No  results for input(s): ESRSEDRATE in the last 72 hours. C-Reactive Protein No results for input(s): CRP in the last 72 hours.  Micro Results: Recent Results (from the past 720 hour(s))  Blood Culture (routine x 2)     Status: None   Collection Time: 04/27/17  6:40 PM  Result Value Ref Range Status   Specimen Description BLOOD LEFT HAND  Final   Special Requests   Final    BOTTLES DRAWN AEROBIC AND ANAEROBIC Blood Culture adequate volume   Culture NO GROWTH 5 DAYS  Final   Report Status 05/02/2017 FINAL  Final  Blood Culture (routine x 2)     Status: None   Collection Time: 04/27/17  6:55 PM  Result Value Ref Range Status   Specimen Description BLOOD BLOOD RIGHT FOREARM  Final   Special Requests IN PEDIATRIC BOTTLE Blood Culture adequate volume  Final   Culture NO GROWTH 5 DAYS  Final   Report Status 05/02/2017 FINAL  Final  Urine culture     Status: None   Collection Time: 04/27/17  8:15 PM  Result Value Ref Range Status   Specimen Description URINE, CATHETERIZED  Final   Special Requests NONE  Final   Culture NO GROWTH  Final   Report Status 04/29/2017 FINAL  Final  MRSA PCR Screening     Status: None   Collection Time: 04/27/17 11:36 PM  Result Value Ref Range Status   MRSA by PCR NEGATIVE NEGATIVE Final    Comment:        The GeneXpert MRSA Assay (FDA approved for NASAL specimens only), is one component of a comprehensive MRSA colonization surveillance program. It is not intended to diagnose MRSA infection nor to guide or monitor treatment for MRSA infections.   Respiratory Panel by PCR     Status: None   Collection Time: 04/28/17  9:58 AM  Result Value Ref Range Status   Adenovirus NOT DETECTED NOT DETECTED Final   Coronavirus 229E NOT DETECTED NOT DETECTED Final   Coronavirus HKU1 NOT DETECTED NOT DETECTED Final   Coronavirus NL63 NOT DETECTED NOT DETECTED Final   Coronavirus OC43 NOT DETECTED NOT DETECTED Final   Metapneumovirus NOT DETECTED NOT DETECTED Final     Rhinovirus / Enterovirus NOT DETECTED NOT DETECTED Final   Influenza A NOT DETECTED NOT DETECTED Final   Influenza B NOT DETECTED NOT DETECTED Final   Parainfluenza Virus 1 NOT DETECTED NOT DETECTED Final   Parainfluenza Virus 2 NOT DETECTED NOT DETECTED Final   Parainfluenza Virus 3  NOT DETECTED NOT DETECTED Final   Parainfluenza Virus 4 NOT DETECTED NOT DETECTED Final   Respiratory Syncytial Virus NOT DETECTED NOT DETECTED Final   Bordetella pertussis NOT DETECTED NOT DETECTED Final   Chlamydophila pneumoniae NOT DETECTED NOT DETECTED Final   Mycoplasma pneumoniae NOT DETECTED NOT DETECTED Final  Culture, blood (Routine x 2)     Status: Abnormal   Collection Time: 05/12/17  3:03 PM  Result Value Ref Range Status   Specimen Description BLOOD RIGHT HAND  Final   Special Requests   Final    BOTTLES DRAWN AEROBIC AND ANAEROBIC Blood Culture adequate volume   Culture  Setup Time   Final    GRAM POSITIVE COCCI IN PAIRS AND CHAINS IN BOTH AEROBIC AND ANAEROBIC BOTTLES CRITICAL RESULT CALLED TO, READ BACK BY AND VERIFIED WITH: N GLOGOVAC,PHARMD AT 0725 05/13/17 BY L BENFIELD Performed at [facility], [place] [address], [place], [name] [address]    Culture STREPTOCOCCUS GALLOLYTICUS (A)  Final   Report Status 05/15/2017 FINAL  Final   Organism ID, Bacteria STREPTOCOCCUS GALLOLYTICUS  Final      Susceptibility   Streptococcus gallolyticus - MIC*    PENICILLIN 0.12 SENSITIVE Sensitive     CEFTRIAXONE 0.25 SENSITIVE Sensitive     ERYTHROMYCIN <=0.12 SENSITIVE Sensitive     LEVOFLOXACIN 4 INTERMEDIATE Intermediate     VANCOMYCIN 0.25 SENSITIVE Sensitive     * STREPTOCOCCUS GALLOLYTICUS  Blood Culture ID Panel (Reflexed)     Status: Abnormal   Collection Time: 05/12/17  3:03 PM  Result Value Ref Range Status   Enterococcus species NOT DETECTED NOT DETECTED Final   Listeria monocytogenes NOT DETECTED NOT DETECTED Final   Staphylococcus species NOT DETECTED NOT DETECTED Final    Staphylococcus aureus NOT DETECTED NOT DETECTED Final   Streptococcus species DETECTED (A) NOT DETECTED Final    Comment: Not Enterococcus species, Streptococcus agalactiae, Streptococcus pyogenes, or Streptococcus pneumoniae. CRITICAL RESULT CALLED TO, READ BACK BY AND VERIFIED WITH: N GLOGOVAC,PHARMD AT 0725 05/13/17 BY L BENFIELD    Streptococcus agalactiae NOT DETECTED NOT DETECTED Final   Streptococcus pneumoniae NOT DETECTED NOT DETECTED Final   Streptococcus pyogenes NOT DETECTED NOT DETECTED Final   Acinetobacter baumannii NOT DETECTED NOT DETECTED Final   Enterobacteriaceae species NOT DETECTED NOT DETECTED Final   Enterobacter cloacae complex NOT DETECTED NOT DETECTED Final   Escherichia coli NOT DETECTED NOT DETECTED Final   Klebsiella oxytoca NOT DETECTED NOT DETECTED Final   Klebsiella pneumoniae NOT DETECTED NOT DETECTED Final   Proteus species NOT DETECTED NOT DETECTED Final   Serratia marcescens NOT DETECTED NOT DETECTED Final   Haemophilus influenzae NOT DETECTED NOT DETECTED Final   Neisseria meningitidis NOT DETECTED NOT DETECTED Final   Pseudomonas aeruginosa NOT DETECTED NOT DETECTED Final   Candida albicans NOT DETECTED NOT DETECTED Final   Candida glabrata NOT DETECTED NOT DETECTED Final   Candida krusei NOT DETECTED NOT DETECTED Final   Candida parapsilosis NOT DETECTED NOT DETECTED Final   Candida tropicalis NOT DETECTED NOT DETECTED Final    Comment: Performed at [facility], [place], [place],  [address]  Culture, blood (Routine x 2)     Status: Abnormal   Collection Time: 05/12/17  3:55 PM  Result Value Ref Range Status   Specimen Description BLOOD LEFT HAND  Final   Special Requests   Final    BOTTLES DRAWN AEROBIC AND ANAEROBIC Blood Culture adequate volume   Culture  Setup Time  Final    GRAM POSITIVE COCCI IN PAIRS AND CHAINS IN BOTH AEROBIC AND ANAEROBIC BOTTLES CRITICAL VALUE NOTED.  VALUE IS CONSISTENT WITH PREVIOUSLY REPORTED  AND CALLED VALUE.    Culture (A)  Final    STREPTOCOCCUS GALLOLYTICUS ACINETOBACTER CALCOACETICUS/BAUMANNII COMPLEX CRITICAL RESULT CALLED TO, READ BACK BY AND VERIFIED WITH: J GADHIA,PHARMD AT 1010 05/14/17 BY L BENFIELD CONCERNING GROWTH ON CULTURE SUSCEPTIBILITIES PERFORMED ON PREVIOUS CULTURE WITHIN THE LAST 5 DAYS. ON STREPTOCOCCUS GALLOLYTICUS Performed at [facility], [place] [address], [place], [place] [address]    Report Status 05/15/2017 FINAL  Final   Organism ID, Bacteria ACINETOBACTER CALCOACETICUS/BAUMANNII COMPLEX  Final      Susceptibility   Acinetobacter calcoaceticus/baumannii complex - MIC*    CEFTAZIDIME 4 SENSITIVE Sensitive     CEFTRIAXONE 16 INTERMEDIATE Intermediate     CIPROFLOXACIN <=0.25 SENSITIVE Sensitive     GENTAMICIN <=1 SENSITIVE Sensitive     IMIPENEM <=0.25 SENSITIVE Sensitive     PIP/TAZO 32 INTERMEDIATE Intermediate     TRIMETH/SULFA 160 RESISTANT Resistant     CEFEPIME 4 SENSITIVE Sensitive     AMPICILLIN/SULBACTAM <=2 SENSITIVE Sensitive     * ACINETOBACTER CALCOACETICUS/BAUMANNII COMPLEX  Respiratory Panel by PCR     Status: None   Collection Time: 05/12/17  7:58 PM  Result Value Ref Range Status   Adenovirus NOT DETECTED NOT DETECTED Final   Coronavirus 229E NOT DETECTED NOT DETECTED Final   Coronavirus HKU1 NOT DETECTED NOT DETECTED Final   Coronavirus NL63 NOT DETECTED NOT DETECTED Final   Coronavirus OC43 NOT DETECTED NOT DETECTED Final   Metapneumovirus NOT DETECTED NOT DETECTED Final   Rhinovirus / Enterovirus NOT DETECTED NOT DETECTED Final   Influenza A NOT DETECTED NOT DETECTED Final   Influenza B NOT DETECTED NOT DETECTED Final   Parainfluenza Virus 1 NOT DETECTED NOT DETECTED Final   Parainfluenza Virus 2 NOT DETECTED NOT DETECTED Final   Parainfluenza Virus 3 NOT DETECTED NOT DETECTED Final   Parainfluenza Virus 4 NOT DETECTED NOT DETECTED Final   Respiratory Syncytial Virus NOT DETECTED NOT DETECTED Final   Bordetella  pertussis NOT DETECTED NOT DETECTED Final   Chlamydophila pneumoniae NOT DETECTED NOT DETECTED Final   Mycoplasma pneumoniae NOT DETECTED NOT DETECTED Final    Comment: Performed at [facility], [place] [address], [place], [place] [address]  Gastrointestinal Panel by PCR , Stool     Status: Abnormal   Collection Time: 05/12/17  7:58 PM  Result Value Ref Range Status   Campylobacter species NOT DETECTED NOT DETECTED Final   Plesimonas shigelloides NOT DETECTED NOT DETECTED Final   Salmonella species NOT DETECTED NOT DETECTED Final   Yersinia enterocolitica DETECTED (A) NOT DETECTED Final    Comment: RESULT CALLED TO, READ BACK BY AND VERIFIED WITH:  Randa Lynn AT 0624 05/14/17 SDR    Vibrio species NOT DETECTED NOT DETECTED Final   Vibrio cholerae NOT DETECTED NOT DETECTED Final   Enteroaggregative E coli (EAEC) NOT DETECTED NOT DETECTED Final   Enteropathogenic E coli (EPEC) NOT DETECTED NOT DETECTED Final   Enterotoxigenic E coli (ETEC) NOT DETECTED NOT DETECTED Final   Shiga like toxin producing E coli (STEC) NOT DETECTED NOT DETECTED Final   Shigella/Enteroinvasive E coli (EIEC) NOT DETECTED NOT DETECTED Final   Cryptosporidium NOT DETECTED NOT DETECTED Final   Cyclospora cayetanensis NOT DETECTED NOT DETECTED Final   Entamoeba histolytica NOT DETECTED NOT DETECTED Final   Giardia lamblia NOT DETECTED NOT DETECTED Final  Adenovirus F40/41 NOT DETECTED NOT DETECTED Final   Astrovirus NOT DETECTED NOT DETECTED Final   Norovirus GI/GII NOT DETECTED NOT DETECTED Final   Rotavirus A NOT DETECTED NOT DETECTED Final   Sapovirus (I, II, IV, and V) NOT DETECTED NOT DETECTED Final  Culture, blood (routine x 2)     Status: None (Preliminary result)   Collection Time: 05/14/17 11:42 AM  Result Value Ref Range Status   Specimen Description BLOOD RIGHT ANTECUBITAL  Final   Special Requests   Final    BOTTLES DRAWN AEROBIC ONLY Blood Culture adequate volume   Culture   Final    NO  GROWTH < 24 HOURS Performed at [facility], [address], [place], [place] [address]    Report Status PENDING  Incomplete  Culture, blood (routine x 2)     Status: None (Preliminary result)   Collection Time: 05/14/17 11:42 AM  Result Value Ref Range Status   Specimen Description BLOOD BLOOD RIGHT FOREARM  Final   Special Requests IN PEDIATRIC BOTTLE Blood Culture adequate volume  Final   Culture   Final    NO GROWTH < 24 HOURS Performed at [facility], [place] [address], [place],  [address]    Report Status PENDING  Incomplete  Gram stain     Status: None   Collection Time: 05/15/17  4:07 PM  Result Value Ref Range Status   Specimen Description PERITONEAL  Final   Special Requests NONE  Final   Gram Stain   Final    WBC PRESENT, PREDOMINANTLY PMN NO ORGANISMS SEEN CYTOSPIN SMEAR Performed at [facility], [place] [address], [place],  [address]    Report Status 05/16/2017 FINAL  Final    Studies/Results: US Paracentesis  Result Date: 05/15/2017 INDICATION: Patient with abdominal distention and ascites. For diagnostic and therapeutic paracentesis. EXAM: ULTRASOUND GUIDED DIAGNOSTIC AND THERAPEUTIC PARACENTESIS MEDICATIONS: 1% lidocaine COMPLICATIONS: None immediate. PROCEDURE: Informed written consent was obtained from the patient after a discussion of the risks, benefits and alternatives to treatment. A timeout was performed prior to the initiation of the procedure. Initial ultrasound scanning demonstrates a large amount of ascites within the left lateral abdomen. The left lateral abdomen was prepped and draped in the usual sterile fashion. 1% lidocaine was used for local anesthesia. Following this, a 19 gauge, 7-cm, Yueh catheter was introduced. An ultrasound image was saved for documentation purposes. The paracentesis was performed. The catheter was removed and a dressing was applied. The patient tolerated the procedure well without immediate post  procedural complication. FINDINGS: A total of approximately 1.7 of hazy, yellow fluid was removed. Samples were sent to the laboratory as requested by the clinical team. IMPRESSION: Successful ultrasound-guided diagnostic and therapeutic paracentesis yielding 1.7 liters of peritoneal fluid. Read by:  Brynda Greathouse PA-C Electronically Signed   By: Lucrezia Europe M.D.   On: 05/15/2017 16:43      Assessment/Plan:  INTERVAL HISTORY: pt now sp TTE and US guided paracentesis   Principal Problem:   Fever Active Problems:   Bacteremia   Liver cirrhosis secondary to NASH (HCC)   Hypokalemia   Anemia   Diabetes mellitus type 2, uncontrolled, with complications (HCC)   Thrombocytopenia (HCC)   Ascites   Hyperammonemia (HCC)   Diarrhea of presumed infectious origin   Infection due to acinetobacter baumannii   Streptococcus bovis infection   Acute bacterial endocarditis    Adam Butler is a 61 y.o. male with NASH,  cirrhosis with admission with hepatic encephalopathy with blood cultures with Streptococcus gallyolyticus in 2/2 blood cultures and acinetobacter in 1/2 blood cultures now sp paracentesis.  #1 Streptococcus gallyolyticus (bovis) bacteremia:   This organism is NOTORIOUS for causing endocarditis  TTE does not show endocarditis but not sensitive enough  He is going for TEE tomorrow  Based on AHA guidelines his MIC to PCN is = 0.12 which just barely puts his S bovis in the "very S to PCN" category. AMP is also listed as option here and AMP/SUL would also cover his Acinetobacter  I will change him to Unasyn  Will also discuss with ID pharmacy  He will likely need 4 weeks of IV abx at home with wife if they can handle this  Source is likely intra-abdominal   He states he HAD colonoscopy one month ago. GI MD (Dr Hilarie Fredrickson) Needs to be made aware of the S bovis diagnosis   #2 Acinetobacter in blood cultures: is S to IMIPENEM as well as ceftazidime, unasyn  I am going to cover  it for now though I dont want to compromise optimal rx for the S bovis  #3 ? SBP: PMNS not quite meeting criteria but this was also after abx  #4 NASH and pancytopenia: defer to primary team and hepatology               LOS: 4 days   Alcide Evener 05/16/2017, 11:13 AM

## 2017-05-16 NOTE — Progress Notes (Signed)
    [facility] has been requested to perform a transesophageal echocardiogram on 11/07 for bacteremia.  After careful review of history and examination, the risks and benefits of transesophageal echocardiogram have been explained including risks of esophageal damage, perforation (1:10,000 risk), bleeding, pharyngeal hematoma as well as other potential complications associated with conscious sedation including aspiration, arrhythmia, respiratory failure and death. Alternatives to treatment were discussed, questions were answered. Patient is willing to proceed.   Adam Butler 05/16/2017 9:49 AM

## 2017-05-16 NOTE — Progress Notes (Signed)
On call Adam Butler notified of pt's BP 99/50 at this time. Pt asymptomatic. Will continue to monitor pt.

## 2017-05-16 NOTE — Care Management Important Message (Signed)
Important Message  Patient Details  Name: Adam Butler MRN: [id number] Date of Birth: [birth year]   Medicare Important Message Given:  Yes    Kerin Salen 05/16/2017, 11:11 AMImportant Message  Patient Details  Name: Adam Butler MRN: [id number] Date of Birth: 04-Sep-1955   Medicare Important Message Given:  Yes    Kerin Salen 05/16/2017, 11:11 AM

## 2017-05-16 NOTE — Progress Notes (Signed)
Pharmacy Antibiotic Note Adam Butler is a 61 y.o. Male with PMH of DM, liver cirrhosis secondary to NASH, esophageal varices, and Group B strep bacteremia admitted 05/12/17 for AMS and sepsis. Initially placed on vancomycin and zosyn but subsequently changed to ceftriaxone for strep species in the blood. Transitioned to Primaxin on 11/4 for acinetobacter in blood. Based on susceptibilities, ID recomends change to Unasyn.   05/16/2017 - Afebrile, wbc low - Scr 0.73 (11/5) and stable - Plan for TEE on 05/17/17  Plan: - Discontinue Primaxin  - Initiate Unasyn 3g IV q6h - f/u cultures and TEE  Height: 5' 7"  (170.2 cm) Weight: 282 lb 3 oz (128 kg) IBW/kg (Calculated) : 66.1  Temp (24hrs), Avg:98 F (36.7 C), Min:97.8 F (36.6 C), Max:98.1 F (36.7 C)  Recent Labs  Lab 05/12/17 1502 05/12/17 1508 05/12/17 1705 05/13/17 0533 05/15/17 0739  WBC 9.6  --   --  7.7 3.7*  CREATININE 0.81  --   --  0.65 0.73  LATICACIDVEN  --  2.01* 1.34  --   --     Estimated Creatinine Clearance: 124.7 mL/min (by C-G formula based on SCr of 0.73 mg/dL).    Allergies  Allergen Reactions  . Isosorbide Nitrate Other (See Comments)    Nose bleeds    Antimicrobials this admission: 11/2>>Vancomycin >> 11/3 11/2>>Zosyn >> 11/3 11/3 >> Ceftriaxone >> 11/4 11/4 >> Primaxin >> 11/6  Dose adjustments this admission:  Microbiology results: 11/2 BCx:2/2 setsStreptococcus gallolyticus (I= LVQ); 1/2sets with Acinetobacter calcoaceticus/baumannii complex (could not run BCID on GNR)--> I= CTX, zosyn; R= bactrim 11/2 GI panel: + Yersinia enterocolitica 11/2 Respiratory panel by PCR: negative 11/4 BCx x2: 11/5 peritoneal washing  Thank you for allowing pharmacy to be a part of this patient's care.  Richmond Campbell 05/16/2017 1:45 PM

## 2017-05-17 ENCOUNTER — Inpatient Hospital Stay (HOSPITAL_COMMUNITY): Payer: PPO

## 2017-05-17 ENCOUNTER — Inpatient Hospital Stay (HOSPITAL_COMMUNITY): Admission: RE | Admit: 2017-05-17 | Payer: PPO | Source: Ambulatory Visit | Admitting: Cardiovascular Disease

## 2017-05-17 ENCOUNTER — Encounter (HOSPITAL_COMMUNITY)
Admission: EM | Disposition: A | Discharge status: Home Health | Payer: Self-pay | Source: Home / Self Care | Attending: Nephrology

## 2017-05-17 ENCOUNTER — Encounter (HOSPITAL_COMMUNITY): Payer: Self-pay

## 2017-05-17 DIAGNOSIS — I350 Nonrheumatic aortic (valve) stenosis: Secondary | ICD-10-CM

## 2017-05-17 DIAGNOSIS — R197 Diarrhea, unspecified: Secondary | ICD-10-CM

## 2017-05-17 HISTORY — PX: TEE WITHOUT CARDIOVERSION: SHX5443

## 2017-05-17 LAB — GLUCOSE, CAPILLARY
Glucose-Capillary: 112 mg/dL — ABNORMAL HIGH (ref 65–99)
Glucose-Capillary: 109 mg/dL — ABNORMAL HIGH (ref 65–99)
Glucose-Capillary: 92 mg/dL (ref 65–99)
Glucose-Capillary: 153 mg/dL — ABNORMAL HIGH (ref 65–99)

## 2017-05-17 LAB — BASIC METABOLIC PANEL
Anion gap: 7 (ref 5–15)
BUN: 9 mg/dL (ref 6–20)
CALCIUM: 8.2 mg/dL — AB (ref 8.9–10.3)
CHLORIDE: 109 mmol/L (ref 101–111)
CO2: 21 mmol/L — AB (ref 22–32)
CREATININE: 0.67 mg/dL (ref 0.61–1.24)
GFR calc non Af Amer: >60 mL/min (ref >60)
GLUCOSE: 126 mg/dL — AB (ref 65–99)
Potassium: 3.9 mmol/L (ref 3.5–5.1)
Sodium: 137 mmol/L (ref 135–145)

## 2017-05-17 LAB — AMYLASE, PLEURAL OR PERITONEAL FLUID: Amylase, Fluid: <5 U/L

## 2017-05-17 LAB — CBC
HEMATOCRIT: 27.6 % — AB (ref 39.0–52.0)
HEMOGLOBIN: 9.4 g/dL — AB (ref 13.0–17.0)
MCH: 34.2 pg — AB (ref 26.0–34.0)
MCHC: 34.1 g/dL (ref 30.0–36.0)
MCV: 100.4 fL — AB (ref 78.0–100.0)
Platelets: 49 10*3/uL — ABNORMAL LOW (ref 150–400)
RBC: 2.75 MIL/uL — ABNORMAL LOW (ref 4.22–5.81)
RDW: 15.4 % (ref 11.5–15.5)
WBC: 2.5 10*3/uL — ABNORMAL LOW (ref 4.0–10.5)

## 2017-05-17 SURGERY — ECHOCARDIOGRAM, TRANSESOPHAGEAL
Anesthesia: Moderate Sedation

## 2017-05-17 MED ORDER — MIDAZOLAM HCL 10 MG/2ML IJ SOLN
INTRAMUSCULAR | Status: DC | PRN
  Administered 2017-05-17 (×2): 2 mg via INTRAVENOUS

## 2017-05-17 MED ORDER — FENTANYL CITRATE (PF) 100 MCG/2ML IJ SOLN
INTRAMUSCULAR | Status: DC | PRN
Start: 2017-05-17 — End: 2017-05-17
  Administered 2017-05-17 (×2): 25 ug via INTRAVENOUS

## 2017-05-17 MED ORDER — BUTAMBEN-TETRACAINE-BENZOCAINE 2-2-14 % EX AERO
INHALATION_SPRAY | CUTANEOUS | Status: DC | PRN
  Administered 2017-05-17: 2 via TOPICAL

## 2017-05-17 MED ORDER — FENTANYL CITRATE (PF) 100 MCG/2ML IJ SOLN
INTRAMUSCULAR | Status: AC
  Filled 2017-05-17: qty 2

## 2017-05-17 MED ORDER — MIDAZOLAM HCL 5 MG/ML IJ SOLN
INTRAMUSCULAR | Status: AC
  Filled 2017-05-17: qty 2

## 2017-05-17 NOTE — Progress Notes (Signed)
Subjective:  Pt was getting TEE   Antibiotics:  Anti-infectives (From admission, onward)   Start     Dose/Rate Route Frequency Ordered Stop   05/16/17 1000  Ampicillin-Sulbactam (UNASYN) 3 g in sodium chloride 0.9 % 100 mL IVPB     3 g 200 mL/hr over 30 Minutes Intravenous Every 6 hours 05/16/17 0857     05/14/17 1800  imipenem-cilastatin (PRIMAXIN) 500 mg in sodium chloride 0.9 % 100 mL IVPB  Status:  Discontinued     500 mg 200 mL/hr over 30 Minutes Intravenous Every 6 hours 05/14/17 1702 05/16/17 0838   05/13/17 1000  cefTRIAXone (ROCEPHIN) 2 g in dextrose 5 % 50 mL IVPB  Status:  Discontinued     2 g 100 mL/hr over 30 Minutes Intravenous Every 24 hours 05/13/17 0851 05/14/17 1657   05/13/17 0300  piperacillin-tazobactam (ZOSYN) IVPB 3.375 g  Status:  Discontinued     3.375 g 12.5 mL/hr over 240 Minutes Intravenous Every 8 hours 05/13/17 0246 05/13/17 0844   05/12/17 2359  vancomycin (VANCOCIN) 1,250 mg in sodium chloride 0.9 % 250 mL IVPB  Status:  Discontinued     1,250 mg 166.7 mL/hr over 90 Minutes Intravenous Every 12 hours 05/12/17 2150 05/13/17 0844   05/12/17 2200  rifaximin (XIFAXAN) tablet 550 mg     550 mg Oral 2 times daily 05/12/17 1951     05/12/17 1600  piperacillin-tazobactam (ZOSYN) IVPB 3.375 g     3.375 g 100 mL/hr over 30 Minutes Intravenous  Once 05/12/17 1547 05/12/17 1623   05/12/17 1600  vancomycin (VANCOCIN) IVPB 1000 mg/200 mL premix     1,000 mg 200 mL/hr over 60 Minutes Intravenous  Once 05/12/17 1547 05/12/17 1738      Medications: Scheduled Meds: . aspirin EC  81 mg Oral Daily   And  . calcium carbonate  1 tablet Oral Daily  . atorvastatin  10 mg Oral Daily  . famotidine  10 mg Oral BID  . feeding supplement (PRO-STAT SUGAR FREE 64)  30 mL Oral BID  . insulin aspart  0-5 Units Subcutaneous QHS  . insulin aspart  0-9 Units Subcutaneous TID WC  . insulin glargine  8 Units Subcutaneous QHS  . lactulose  20 g Oral QHS  .  multivitamin with minerals  1 tablet Oral Daily  . PARoxetine  10 mg Oral Daily  . rifaximin  550 mg Oral BID  . sodium chloride flush  3 mL Intravenous Q12H  . spironolactone  12.5 mg Oral Daily  . vitamin B-12  1,000 mcg Oral Daily   Continuous Infusions: . sodium chloride    . [START ON 05/18/2017] albumin human    . ampicillin-sulbactam (UNASYN) IV Stopped (05/17/17 1716)   PRN Meds:.sodium chloride, fluticasone, nitroGLYCERIN, sodium chloride flush    Objective: Weight change: -4.6 oz (-2.399 kg)  Intake/Output Summary (Last 24 hours) at 05/17/2017 1840 Last data filed at 05/17/2017 0600 Gross per 24 hour  Intake 348.33 ml  Output -  Net 348.33 ml   Blood pressure (!) 127/54, pulse (!) 59, temperature 97.8 F (36.6 C), temperature source Oral, resp. rate 18, height 5' 7"  (1.702 m), weight 276 lb 14.4 oz (125.6 kg), SpO2 99 %. Temp:  [97.7 F (36.5 C)-98.4 F (36.9 C)] 97.8 F (36.6 C) (11/07 1543) Pulse Rate:  [57-68] 59 (11/07 1543) Resp:  [16-23] 18 (11/07 1543) BP: (85-127)/(27-59) 127/54 (11/07 1543) SpO2:  [92 %-100 %] 99 % (  11/07 1543) Weight:  [276 lb 14.4 oz (125.6 kg)] 276 lb 14.4 oz (125.6 kg) (11/07 0635)  Physical Exam:   CBC:  CBC Latest Ref Rng & Units 05/17/2017 05/15/2017 05/13/2017  WBC 4.0 - 10.5 K/uL 2.5(L) 3.7(L) 7.7  Hemoglobin 13.0 - 17.0 g/dL 9.4(L) 9.8(L) 10.1(L)  Hematocrit 39.0 - 52.0 % 27.6(L) 28.9(L) 29.6(L)  Platelets 150 - 400 K/uL 49(L) 50(L) 30(L)      BMET Recent Labs    05/15/17 0739 05/17/17 0437  NA 139 137  K 3.8 3.9  CL 109 109  CO2 25 21*  GLUCOSE 105* 126*  BUN 10 9  CREATININE 0.73 0.67  CALCIUM 8.1* 8.2*     Liver Panel  No results for input(s): PROT, ALBUMIN, AST, ALT, ALKPHOS, BILITOT, BILIDIR, IBILI in the last 72 hours.     Sedimentation Rate No results for input(s): ESRSEDRATE in the last 72 hours. C-Reactive Protein No results for input(s): CRP in the last 72 hours.  Micro Results: Recent  Results (from the past 720 hour(s))  Blood Culture (routine x 2)     Status: None   Collection Time: 04/27/17  6:40 PM  Result Value Ref Range Status   Specimen Description BLOOD LEFT HAND  Final   Special Requests   Final    BOTTLES DRAWN AEROBIC AND ANAEROBIC Blood Culture adequate volume   Culture NO GROWTH 5 DAYS  Final   Report Status 05/02/2017 FINAL  Final  Blood Culture (routine x 2)     Status: None   Collection Time: 04/27/17  6:55 PM  Result Value Ref Range Status   Specimen Description BLOOD BLOOD RIGHT FOREARM  Final   Special Requests IN PEDIATRIC BOTTLE Blood Culture adequate volume  Final   Culture NO GROWTH 5 DAYS  Final   Report Status 05/02/2017 FINAL  Final  Urine culture     Status: None   Collection Time: 04/27/17  8:15 PM  Result Value Ref Range Status   Specimen Description URINE, CATHETERIZED  Final   Special Requests NONE  Final   Culture NO GROWTH  Final   Report Status 04/29/2017 FINAL  Final  MRSA PCR Screening     Status: None   Collection Time: 04/27/17 11:36 PM  Result Value Ref Range Status   MRSA by PCR NEGATIVE NEGATIVE Final    Comment:        The GeneXpert MRSA Assay (FDA approved for NASAL specimens only), is one component of a comprehensive MRSA colonization surveillance program. It is not intended to diagnose MRSA infection nor to guide or monitor treatment for MRSA infections.   Respiratory Panel by PCR     Status: None   Collection Time: 04/28/17  9:58 AM  Result Value Ref Range Status   Adenovirus NOT DETECTED NOT DETECTED Final   Coronavirus 229E NOT DETECTED NOT DETECTED Final   Coronavirus HKU1 NOT DETECTED NOT DETECTED Final   Coronavirus NL63 NOT DETECTED NOT DETECTED Final   Coronavirus OC43 NOT DETECTED NOT DETECTED Final   Metapneumovirus NOT DETECTED NOT DETECTED Final   Rhinovirus / Enterovirus NOT DETECTED NOT DETECTED Final   Influenza A NOT DETECTED NOT DETECTED Final   Influenza B NOT DETECTED NOT DETECTED Final    Parainfluenza Virus 1 NOT DETECTED NOT DETECTED Final   Parainfluenza Virus 2 NOT DETECTED NOT DETECTED Final   Parainfluenza Virus 3 NOT DETECTED NOT DETECTED Final   Parainfluenza Virus 4 NOT DETECTED NOT DETECTED Final   Respiratory Syncytial  Virus NOT DETECTED NOT DETECTED Final   Bordetella pertussis NOT DETECTED NOT DETECTED Final   Chlamydophila pneumoniae NOT DETECTED NOT DETECTED Final   Mycoplasma pneumoniae NOT DETECTED NOT DETECTED Final  Culture, blood (Routine x 2)     Status: Abnormal   Collection Time: 05/12/17  3:03 PM  Result Value Ref Range Status   Specimen Description BLOOD RIGHT HAND  Final   Special Requests   Final    BOTTLES DRAWN AEROBIC AND ANAEROBIC Blood Culture adequate volume   Culture  Setup Time   Final    GRAM POSITIVE COCCI IN PAIRS AND CHAINS IN BOTH AEROBIC AND ANAEROBIC BOTTLES CRITICAL RESULT CALLED TO, READ BACK BY AND VERIFIED WITH: N GLOGOVAC,PHARMD AT 0725 05/13/17 BY L BENFIELD Performed at [facility], [place] [address], [place], [place] [address]    Culture STREPTOCOCCUS GALLOLYTICUS (A)  Final   Report Status 05/15/2017 FINAL  Final   Organism ID, Bacteria STREPTOCOCCUS GALLOLYTICUS  Final      Susceptibility   Streptococcus gallolyticus - MIC*    PENICILLIN 0.12 SENSITIVE Sensitive     CEFTRIAXONE 0.25 SENSITIVE Sensitive     ERYTHROMYCIN <=0.12 SENSITIVE Sensitive     LEVOFLOXACIN 4 INTERMEDIATE Intermediate     VANCOMYCIN 0.25 SENSITIVE Sensitive     * STREPTOCOCCUS GALLOLYTICUS  Blood Culture ID Panel (Reflexed)     Status: Abnormal   Collection Time: 05/12/17  3:03 PM  Result Value Ref Range Status   Enterococcus species NOT DETECTED NOT DETECTED Final   Listeria monocytogenes NOT DETECTED NOT DETECTED Final   Staphylococcus species NOT DETECTED NOT DETECTED Final   Staphylococcus aureus NOT DETECTED NOT DETECTED Final   Streptococcus species DETECTED (A) NOT DETECTED Final    Comment: Not Enterococcus species,  Streptococcus agalactiae, Streptococcus pyogenes, or Streptococcus pneumoniae. CRITICAL RESULT CALLED TO, READ BACK BY AND VERIFIED WITH: N GLOGOVAC,PHARMD AT 0725 05/13/17 BY L BENFIELD    Streptococcus agalactiae NOT DETECTED NOT DETECTED Final   Streptococcus pneumoniae NOT DETECTED NOT DETECTED Final   Streptococcus pyogenes NOT DETECTED NOT DETECTED Final   Acinetobacter baumannii NOT DETECTED NOT DETECTED Final   Enterobacteriaceae species NOT DETECTED NOT DETECTED Final   Enterobacter cloacae complex NOT DETECTED NOT DETECTED Final   Escherichia coli NOT DETECTED NOT DETECTED Final   Klebsiella oxytoca NOT DETECTED NOT DETECTED Final   Klebsiella pneumoniae NOT DETECTED NOT DETECTED Final   Proteus species NOT DETECTED NOT DETECTED Final   Serratia marcescens NOT DETECTED NOT DETECTED Final   Haemophilus influenzae NOT DETECTED NOT DETECTED Final   Neisseria meningitidis NOT DETECTED NOT DETECTED Final   Pseudomonas aeruginosa NOT DETECTED NOT DETECTED Final   Candida albicans NOT DETECTED NOT DETECTED Final   Candida glabrata NOT DETECTED NOT DETECTED Final   Candida krusei NOT DETECTED NOT DETECTED Final   Candida parapsilosis NOT DETECTED NOT DETECTED Final   Candida tropicalis NOT DETECTED NOT DETECTED Final    Comment: Performed at [facility], [place]. [address] [place], [place] [address]  Culture, blood (Routine x 2)     Status: Abnormal   Collection Time: 05/12/17  3:55 PM  Result Value Ref Range Status   Specimen Description BLOOD LEFT HAND  Final   Special Requests   Final    BOTTLES DRAWN AEROBIC AND ANAEROBIC Blood Culture adequate volume   Culture  Setup Time   Final    GRAM POSITIVE COCCI IN PAIRS AND CHAINS IN BOTH AEROBIC AND ANAEROBIC BOTTLES  CRITICAL VALUE NOTED.  VALUE IS CONSISTENT WITH PREVIOUSLY REPORTED AND CALLED VALUE.    Culture (A)  Final    STREPTOCOCCUS GALLOLYTICUS ACINETOBACTER CALCOACETICUS/BAUMANNII COMPLEX CRITICAL RESULT CALLED TO,  READ BACK BY AND VERIFIED WITH: J GADHIA,PHARMD AT 1010 05/14/17 BY L BENFIELD CONCERNING GROWTH ON CULTURE SUSCEPTIBILITIES PERFORMED ON PREVIOUS CULTURE WITHIN THE LAST 5 DAYS. ON STREPTOCOCCUS GALLOLYTICUS Performed at [facility], [place] [address] [place], [place] [address]    Report Status 05/15/2017 FINAL  Final   Organism ID, Bacteria ACINETOBACTER CALCOACETICUS/BAUMANNII COMPLEX  Final      Susceptibility   Acinetobacter calcoaceticus/baumannii complex - MIC*    CEFTAZIDIME 4 SENSITIVE Sensitive     CEFTRIAXONE 16 INTERMEDIATE Intermediate     CIPROFLOXACIN <=0.25 SENSITIVE Sensitive     GENTAMICIN <=1 SENSITIVE Sensitive     IMIPENEM <=0.25 SENSITIVE Sensitive     PIP/TAZO 32 INTERMEDIATE Intermediate     TRIMETH/SULFA 160 RESISTANT Resistant     CEFEPIME 4 SENSITIVE Sensitive     AMPICILLIN/SULBACTAM <=2 SENSITIVE Sensitive     * ACINETOBACTER CALCOACETICUS/BAUMANNII COMPLEX  Respiratory Panel by PCR     Status: None   Collection Time: 05/12/17  7:58 PM  Result Value Ref Range Status   Adenovirus NOT DETECTED NOT DETECTED Final   Coronavirus 229E NOT DETECTED NOT DETECTED Final   Coronavirus HKU1 NOT DETECTED NOT DETECTED Final   Coronavirus NL63 NOT DETECTED NOT DETECTED Final   Coronavirus OC43 NOT DETECTED NOT DETECTED Final   Metapneumovirus NOT DETECTED NOT DETECTED Final   Rhinovirus / Enterovirus NOT DETECTED NOT DETECTED Final   Influenza A NOT DETECTED NOT DETECTED Final   Influenza B NOT DETECTED NOT DETECTED Final   Parainfluenza Virus 1 NOT DETECTED NOT DETECTED Final   Parainfluenza Virus 2 NOT DETECTED NOT DETECTED Final   Parainfluenza Virus 3 NOT DETECTED NOT DETECTED Final   Parainfluenza Virus 4 NOT DETECTED NOT DETECTED Final   Respiratory Syncytial Virus NOT DETECTED NOT DETECTED Final   Bordetella pertussis NOT DETECTED NOT DETECTED Final   Chlamydophila pneumoniae NOT DETECTED NOT DETECTED Final   Mycoplasma pneumoniae NOT DETECTED NOT  DETECTED Final    Comment: Performed at [facility], [place] [address] [place], [place] [address]  Gastrointestinal Panel by PCR , Stool     Status: Abnormal   Collection Time: 05/12/17  7:58 PM  Result Value Ref Range Status   Campylobacter species NOT DETECTED NOT DETECTED Final   Plesimonas shigelloides NOT DETECTED NOT DETECTED Final   Salmonella species NOT DETECTED NOT DETECTED Final   Yersinia enterocolitica DETECTED (A) NOT DETECTED Final    Comment: RESULT CALLED TO, READ BACK BY AND VERIFIED WITH:  Randa Lynn AT 0624 05/14/17 SDR    Vibrio species NOT DETECTED NOT DETECTED Final   Vibrio cholerae NOT DETECTED NOT DETECTED Final   Enteroaggregative E coli (EAEC) NOT DETECTED NOT DETECTED Final   Enteropathogenic E coli (EPEC) NOT DETECTED NOT DETECTED Final   Enterotoxigenic E coli (ETEC) NOT DETECTED NOT DETECTED Final   Shiga like toxin producing E coli (STEC) NOT DETECTED NOT DETECTED Final   Shigella/Enteroinvasive E coli (EIEC) NOT DETECTED NOT DETECTED Final   Cryptosporidium NOT DETECTED NOT DETECTED Final   Cyclospora cayetanensis NOT DETECTED NOT DETECTED Final   Entamoeba histolytica NOT DETECTED NOT DETECTED Final   Giardia lamblia NOT DETECTED NOT DETECTED Final   Adenovirus F40/41 NOT DETECTED NOT DETECTED Final   Astrovirus NOT DETECTED NOT DETECTED Final  Norovirus GI/GII NOT DETECTED NOT DETECTED Final   Rotavirus A NOT DETECTED NOT DETECTED Final   Sapovirus (I, II, IV, and V) NOT DETECTED NOT DETECTED Final  Culture, blood (routine x 2)     Status: None (Preliminary result)   Collection Time: 05/14/17 11:42 AM  Result Value Ref Range Status   Specimen Description BLOOD RIGHT ANTECUBITAL  Final   Special Requests   Final    BOTTLES DRAWN AEROBIC ONLY Blood Culture adequate volume   Culture   Final    NO GROWTH 3 DAYS Performed at [facility], [address], [place], [place] [address]    Report Status PENDING  Incomplete  Culture,  blood (routine x 2)     Status: None (Preliminary result)   Collection Time: 05/14/17 11:42 AM  Result Value Ref Range Status   Specimen Description BLOOD BLOOD RIGHT FOREARM  Final   Special Requests IN PEDIATRIC BOTTLE Blood Culture adequate volume  Final   Culture   Final    NO GROWTH 3 DAYS Performed at [facility], [place] [address], [place], [place] [address]    Report Status PENDING  Incomplete  Culture, body fluid-bottle     Status: None (Preliminary result)   Collection Time: 05/15/17  4:07 PM  Result Value Ref Range Status   Specimen Description PERITONEAL  Final   Special Requests NONE  Final   Culture   Final    NO GROWTH 2 DAYS Performed at [facility], [address], [place], [place] [address]    Report Status PENDING  Incomplete  Gram stain     Status: None   Collection Time: 05/15/17  4:07 PM  Result Value Ref Range Status   Specimen Description PERITONEAL  Final   Special Requests NONE  Final   Gram Stain   Final    WBC PRESENT, PREDOMINANTLY PMN NO ORGANISMS SEEN CYTOSPIN SMEAR Performed at [facility], [place] [address] [place],  [address]    Report Status 05/16/2017 FINAL  Final    Studies/Results: No results found.    Assessment/Plan:  INTERVAL HISTORY: pt now sp TTE and US guided paracentesis   Principal Problem:   Fever Active Problems:   Bacteremia   Liver cirrhosis secondary to NASH (HCC)   Hypokalemia   Anemia   Diabetes mellitus type 2, uncontrolled, with complications (HCC)   Thrombocytopenia (HCC)   Ascites   Hyperammonemia (HCC)   Diarrhea of presumed infectious origin   Infection due to acinetobacter baumannii   Streptococcus bovis infection   Acute bacterial endocarditis   SBP (spontaneous bacterial peritonitis) ([place])    Adam Butler is a 61 y.o. male with NASH, cirrhosis with admission with hepatic encephalopathy with blood cultures with Streptococcus gallyolyticus in 2/2 blood cultures and  acinetobacter in 1/2 blood cultures now sp paracentesis.  #1 Streptococcus gallyolyticus (bovis) bacteremia:   Fortunately NO endocarditis on TEE.  Dr. Hilarie Fredrickson from GI has seen patient and doubts very much patient could have colon cancer based on colonoscopy one month ago  He believes this bacteremia stems from SBP  Interestingly we have seen this patient on multiple occasions. He previously had [facility] Streptococcal bacteremia with clean TEE, Klebisella PNA bacteremia, GBS bacteremia prior to current episode  I would treat him with 2 weeks of unasyn from date of first negative blood cultures   #2 Acinetobacter in blood cultures: is S to IMIPENEM as well as ceftazidime, unasyn  Given that this is not a typical contaminant will go ahead and cover this as well with unasyn  #3 Yersinia enterobacter: not clear that this is a pathogen here but it would have been S to pip/tazo, CTX and IF patient goes on FQ prophylaxis it would also be S to that so I am not worried about our ability to treat this organism. It can produce beta lactamases but perhaps also S to unasyn. I would keep eye on the S bovis in the blood stream and Acinetbacter. The Yersinia will get taken care of if it has not already been taken care of               LOS: 5 days   Alcide Evener 05/17/2017, 6:40 PM

## 2017-05-17 NOTE — Interval H&P Note (Signed)
History and Physical Interval Note:  05/17/2017 2:02 PM  Adam Butler  has presented today for surgery, with the diagnosis of bacteremia  The various methods of treatment have been discussed with the patient and family. After consideration of risks, benefits and other options for treatment, the patient has consented to  Procedure(s): TRANSESOPHAGEAL ECHOCARDIOGRAM (TEE) (N/A) as a surgical intervention .  The patient's history has been reviewed, patient examined, no change in status, stable for surgery.  I have reviewed the patient's chart and labs.  Questions were answered to the patient's satisfaction.     Jenkins Rouge

## 2017-05-17 NOTE — Progress Notes (Signed)
PROGRESS NOTE    LUX MEADERS  OVZ:[id number] DOB: 03-Sep-1955 DOA: 05/12/2017 PCP: [name], NP   Brief Narrative:   Assessment & Plan:   # Streptococcal/ Acinetobacter bacteremia likely GI source: -Follow up with repeat blood culture. NGTD -Infectious disease consult appreciated, currently on IV Unasyn. Plan for TEE today. GI evaluated the patient, no plan for endoscopy none. Further antibiotics plan defer to infectious disease. I have discussed this with the patient at bedside and he agreed. Patient is clinically stable.  #Acute hepatitic encephalopathy: Continue lactulose. Mental status improved.  #History of liver cirrhosis with esophageal varices: Continue Xifaxan, Aldactone. ? SBP. On antibiotics  # Infectious diarrhea: GI pathogen growing Yersinia: Follow up ID.  #Type 2 diabetes: Continue current insulin regimen. Monitor blood sugar level.  #Chronic Thrombocytopenia: Platelet count 49. Stable. No sign of bleeding.  PT/OT evaluation.  DVT prophylaxis: SCD Code Status: Full code Family Communication: No family at bedside Disposition Plan: Currently admitted.    Consultants:   Infectious disease  GI  Cardiology for TEE  Procedures: None Antimicrobials: Imipenem, switched to unasyn on 11/6  Subjective: Seen and examined at bedside. Denied headache, dizziness, nausea vomiting chest pressure shortness of breath. Objective: Vitals:   05/14/17 2126 05/14/17 2300 05/15/17 0500 05/15/17 0525  BP: (!) 103/41 (!) 111/52  (!) 117/48  Pulse: 63   65  Resp: 17   17  Temp: 98.3 F (36.8 C)   98.2 F (36.8 C)  TempSrc: Oral   Oral  SpO2: 99%   96%  Weight:   128 kg (282 lb 3 oz)   Height:        Intake/Output Summary (Last 24 hours) at 05/15/2017 1026 Last data filed at 05/15/2017 0200 Gross per 24 hour  Intake 440 ml  Output -  Net 440 ml   Filed Weights   05/12/17 2046 05/14/17 0607 05/15/17 0500  Weight: 130.4 kg (287 lb 7.7 oz) 128.6 kg (283  lb 9.6 oz) 128 kg (282 lb 3 oz)    Examination:  General exam: Not in distress Respiratory system: Clear bilaterally, respiratory effort normal Cardiovascular system: Regular rate rhythm, S1 is normal. No pedal edema Gastrointestinal system: Abdomen soft, nontender. Bowel sound positive. Central nervous system: Alert and oriented. No focal neurological deficits. Skin: No rashes, lesions or ulcers Psychiatry: Judgement and insight appear normal. Mood & affect appropriate.     Data Reviewed: I have personally reviewed following labs and imaging studies  CBC: Recent Labs  Lab 05/12/17 1502 05/13/17 0533 05/15/17 0739  WBC 9.6 7.7 3.7*  NEUTROABS 7.7  --   --   HGB 10.5* 10.1* 9.8*  HCT 30.0* 29.6* 28.9*  MCV 98.4 100.0 99.0  PLT 46* 30* 50*   Basic Metabolic Panel: Recent Labs  Lab 05/12/17 1502 05/12/17 1609 05/13/17 0533 05/15/17 0739  NA 134*  --  138 139  K 2.8*  --  3.6 3.8  CL 104  --  107 109  CO2 24  --  25 25  GLUCOSE 180*  --  107* 105*  BUN 7  --  8 10  CREATININE 0.81  --  0.65 0.73  CALCIUM 7.1*  --  7.2* 8.1*  MG  --  1.3*  --   --    GFR: Estimated Creatinine Clearance: 124.7 mL/min (by C-G formula based on SCr of 0.73 mg/dL). Liver Function Tests: Recent Labs  Lab 05/12/17 1502 05/13/17 0533  AST 66* 63*  ALT 39 34  ALKPHOS 125 94  BILITOT 2.4* 3.4*  PROT 6.0* 5.6*  ALBUMIN 2.1* 1.9*   No results for input(s): LIPASE, AMYLASE in the last 168 hours. Recent Labs  Lab 05/12/17 1552 05/13/17 0533  AMMONIA 80* 55*   Coagulation Profile: Recent Labs  Lab 05/12/17 1502  INR 1.76   Cardiac Enzymes: No results for input(s): CKTOTAL, CKMB, CKMBINDEX, TROPONINI in the last 168 hours. BNP (last 3 results) No results for input(s): PROBNP in the last 8760 hours. HbA1C: No results for input(s): HGBA1C in the last 72 hours. CBG: Recent Labs  Lab 05/14/17 0756 05/14/17 1134 05/14/17 1720 05/14/17 2118 05/15/17 0750  GLUCAP 84 197*  210* 186* 99   Lipid Profile: No results for input(s): CHOL, HDL, LDLCALC, TRIG, CHOLHDL, LDLDIRECT in the last 72 hours. Thyroid Function Tests: No results for input(s): TSH, T4TOTAL, FREET4, T3FREE, THYROIDAB in the last 72 hours. Anemia Panel: No results for input(s): VITAMINB12, FOLATE, FERRITIN, TIBC, IRON, RETICCTPCT in the last 72 hours. Sepsis Labs: Recent Labs  Lab 05/12/17 1508 05/12/17 1705  LATICACIDVEN 2.01* 1.34    Recent Results (from the past 240 hour(s))  Culture, blood (Routine x 2)     Status: Abnormal   Collection Time: 05/12/17  3:03 PM  Result Value Ref Range Status   Specimen Description BLOOD RIGHT HAND  Final   Special Requests   Final    BOTTLES DRAWN AEROBIC AND ANAEROBIC Blood Culture adequate volume   Culture  Setup Time   Final    GRAM POSITIVE COCCI IN PAIRS AND CHAINS IN BOTH AEROBIC AND ANAEROBIC BOTTLES CRITICAL RESULT CALLED TO, READ BACK BY AND VERIFIED WITH: N GLOGOVAC,PHARMD AT 0725 05/13/17 BY L BENFIELD Performed at [facility], [place] [address], [place], [place] [address]    Culture STREPTOCOCCUS GALLOLYTICUS (A)  Final   Report Status 05/15/2017 FINAL  Final   Organism ID, Bacteria STREPTOCOCCUS GALLOLYTICUS  Final      Susceptibility   Streptococcus gallolyticus - MIC*    PENICILLIN 0.12 SENSITIVE Sensitive     CEFTRIAXONE 0.25 SENSITIVE Sensitive     ERYTHROMYCIN <=0.12 SENSITIVE Sensitive     LEVOFLOXACIN 4 INTERMEDIATE Intermediate     VANCOMYCIN 0.25 SENSITIVE Sensitive     * STREPTOCOCCUS GALLOLYTICUS  Blood Culture ID Panel (Reflexed)     Status: Abnormal   Collection Time: 05/12/17  3:03 PM  Result Value Ref Range Status   Enterococcus species NOT DETECTED NOT DETECTED Final   Listeria monocytogenes NOT DETECTED NOT DETECTED Final   Staphylococcus species NOT DETECTED NOT DETECTED Final   Staphylococcus aureus NOT DETECTED NOT DETECTED Final   Streptococcus species DETECTED (A) NOT DETECTED Final    Comment: Not  Enterococcus species, Streptococcus agalactiae, Streptococcus pyogenes, or Streptococcus pneumoniae. CRITICAL RESULT CALLED TO, READ BACK BY AND VERIFIED WITH: N GLOGOVAC,PHARMD AT 0725 05/13/17 BY L BENFIELD    Streptococcus agalactiae NOT DETECTED NOT DETECTED Final   Streptococcus pneumoniae NOT DETECTED NOT DETECTED Final   Streptococcus pyogenes NOT DETECTED NOT DETECTED Final   Acinetobacter baumannii NOT DETECTED NOT DETECTED Final   Enterobacteriaceae species NOT DETECTED NOT DETECTED Final   Enterobacter cloacae complex NOT DETECTED NOT DETECTED Final   Escherichia coli NOT DETECTED NOT DETECTED Final   Klebsiella oxytoca NOT DETECTED NOT DETECTED Final   Klebsiella pneumoniae NOT DETECTED NOT DETECTED Final   Proteus species NOT DETECTED NOT DETECTED Final   Serratia marcescens NOT DETECTED NOT DETECTED Final   Haemophilus influenzae NOT DETECTED  NOT DETECTED Final   Neisseria meningitidis NOT DETECTED NOT DETECTED Final   Pseudomonas aeruginosa NOT DETECTED NOT DETECTED Final   Candida albicans NOT DETECTED NOT DETECTED Final   Candida glabrata NOT DETECTED NOT DETECTED Final   Candida krusei NOT DETECTED NOT DETECTED Final   Candida parapsilosis NOT DETECTED NOT DETECTED Final   Candida tropicalis NOT DETECTED NOT DETECTED Final    Comment: Performed at [facility], [place] [address], [place], [place] [address]  Culture, blood (Routine x 2)     Status: Abnormal   Collection Time: 05/12/17  3:55 PM  Result Value Ref Range Status   Specimen Description BLOOD LEFT HAND  Final   Special Requests   Final    BOTTLES DRAWN AEROBIC AND ANAEROBIC Blood Culture adequate volume   Culture  Setup Time   Final    GRAM POSITIVE COCCI IN PAIRS AND CHAINS IN BOTH AEROBIC AND ANAEROBIC BOTTLES CRITICAL VALUE NOTED.  VALUE IS CONSISTENT WITH PREVIOUSLY REPORTED AND CALLED VALUE.    Culture (A)  Final    STREPTOCOCCUS GALLOLYTICUS ACINETOBACTER CALCOACETICUS/BAUMANNII  COMPLEX CRITICAL RESULT CALLED TO, READ BACK BY AND VERIFIED WITH: J GADHIA,PHARMD AT 1010 05/14/17 BY L BENFIELD CONCERNING GROWTH ON CULTURE SUSCEPTIBILITIES PERFORMED ON PREVIOUS CULTURE WITHIN THE LAST 5 DAYS. ON STREPTOCOCCUS GALLOLYTICUS Performed at [facility], [place] [address], [place], [place] [address]    Report Status 05/15/2017 FINAL  Final   Organism ID, Bacteria ACINETOBACTER CALCOACETICUS/BAUMANNII COMPLEX  Final      Susceptibility   Acinetobacter calcoaceticus/baumannii complex - MIC*    CEFTAZIDIME 4 SENSITIVE Sensitive     CEFTRIAXONE 16 INTERMEDIATE Intermediate     CIPROFLOXACIN <=0.25 SENSITIVE Sensitive     GENTAMICIN <=1 SENSITIVE Sensitive     IMIPENEM <=0.25 SENSITIVE Sensitive     PIP/TAZO 32 INTERMEDIATE Intermediate     TRIMETH/SULFA 160 RESISTANT Resistant     CEFEPIME 4 SENSITIVE Sensitive     AMPICILLIN/SULBACTAM <=2 SENSITIVE Sensitive     * ACINETOBACTER CALCOACETICUS/BAUMANNII COMPLEX  Respiratory Panel by PCR     Status: None   Collection Time: 05/12/17  7:58 PM  Result Value Ref Range Status   Adenovirus NOT DETECTED NOT DETECTED Final   Coronavirus 229E NOT DETECTED NOT DETECTED Final   Coronavirus HKU1 NOT DETECTED NOT DETECTED Final   Coronavirus NL63 NOT DETECTED NOT DETECTED Final   Coronavirus OC43 NOT DETECTED NOT DETECTED Final   Metapneumovirus NOT DETECTED NOT DETECTED Final   Rhinovirus / Enterovirus NOT DETECTED NOT DETECTED Final   Influenza A NOT DETECTED NOT DETECTED Final   Influenza B NOT DETECTED NOT DETECTED Final   Parainfluenza Virus 1 NOT DETECTED NOT DETECTED Final   Parainfluenza Virus 2 NOT DETECTED NOT DETECTED Final   Parainfluenza Virus 3 NOT DETECTED NOT DETECTED Final   Parainfluenza Virus 4 NOT DETECTED NOT DETECTED Final   Respiratory Syncytial Virus NOT DETECTED NOT DETECTED Final   Bordetella pertussis NOT DETECTED NOT DETECTED Final   Chlamydophila pneumoniae NOT DETECTED NOT DETECTED Final   Mycoplasma  pneumoniae NOT DETECTED NOT DETECTED Final    Comment: Performed at [facility], [place] [address], [place], [place] [address]  Gastrointestinal Panel by PCR , Stool     Status: Abnormal   Collection Time: 05/12/17  7:58 PM  Result Value Ref Range Status   Campylobacter species NOT DETECTED NOT DETECTED Final   Plesimonas shigelloides NOT DETECTED NOT DETECTED Final   Salmonella species NOT  DETECTED NOT DETECTED Final   Yersinia enterocolitica DETECTED (A) NOT DETECTED Final    Comment: RESULT CALLED TO, READ BACK BY AND VERIFIED WITH:  Randa Lynn AT 0624 05/14/17 SDR    Vibrio species NOT DETECTED NOT DETECTED Final   Vibrio cholerae NOT DETECTED NOT DETECTED Final   Enteroaggregative E coli (EAEC) NOT DETECTED NOT DETECTED Final   Enteropathogenic E coli (EPEC) NOT DETECTED NOT DETECTED Final   Enterotoxigenic E coli (ETEC) NOT DETECTED NOT DETECTED Final   Shiga like toxin producing E coli (STEC) NOT DETECTED NOT DETECTED Final   Shigella/Enteroinvasive E coli (EIEC) NOT DETECTED NOT DETECTED Final   Cryptosporidium NOT DETECTED NOT DETECTED Final   Cyclospora cayetanensis NOT DETECTED NOT DETECTED Final   Entamoeba histolytica NOT DETECTED NOT DETECTED Final   Giardia lamblia NOT DETECTED NOT DETECTED Final   Adenovirus F40/41 NOT DETECTED NOT DETECTED Final   Astrovirus NOT DETECTED NOT DETECTED Final   Norovirus GI/GII NOT DETECTED NOT DETECTED Final   Rotavirus A NOT DETECTED NOT DETECTED Final   Sapovirus (I, II, IV, and V) NOT DETECTED NOT DETECTED Final         Radiology Studies: No results found.      Scheduled Meds: . aspirin EC  81 mg Oral Daily   And  . calcium carbonate  1 tablet Oral Daily  . atorvastatin  10 mg Oral Daily  . famotidine  10 mg Oral BID  . feeding supplement (PRO-STAT SUGAR FREE 64)  30 mL Oral BID  . insulin aspart  0-5 Units Subcutaneous QHS  . insulin aspart  0-9 Units Subcutaneous TID WC  . insulin glargine  8 Units  Subcutaneous QHS  . lactulose  20 g Oral QHS  . multivitamin with minerals  1 tablet Oral Daily  . PARoxetine  10 mg Oral Daily  . rifaximin  550 mg Oral BID  . sodium chloride flush  3 mL Intravenous Q12H  . spironolactone  12.5 mg Oral Daily  . vitamin B-12  1,000 mcg Oral Daily   Continuous Infusions: . sodium chloride    . albumin human    . imipenem-cilastatin Stopped (05/15/17 0552)     LOS: 3 days    Dron Tanna Furry, MD [facility] Pager [phone]  If 7PM-7AM, please contact night-coverage [email or web address] Password [email or web address] 05/15/2017, 10:26 AM

## 2017-05-17 NOTE — Progress Notes (Signed)
  Echocardiogram Echocardiogram Transesophageal has been performed.  Darlina Sicilian M 05/17/2017, 2:38 PM

## 2017-05-17 NOTE — CV Procedure (Signed)
TEE 6 mg versed 50 ug fentanyl  No SBE LAE PFO positive bubble study No LAA thrombus Mild AS mean gradient 11 mmHg Trivial MR Mild PR Mild TR prominent vena cava flow No effusion Mild RV enlargement Moderate calcific aortic debris  No vegetations  Jenkins Rouge

## 2017-05-18 ENCOUNTER — Other Ambulatory Visit: Payer: PPO

## 2017-05-18 DIAGNOSIS — A491 Streptococcal infection, unspecified site: Secondary | ICD-10-CM

## 2017-05-18 DIAGNOSIS — I330 Acute and subacute infective endocarditis: Secondary | ICD-10-CM

## 2017-05-18 LAB — BASIC METABOLIC PANEL
ANION GAP: 4 — AB (ref 5–15)
BUN: 9 mg/dL (ref 6–20)
CO2: 23 mmol/L (ref 22–32)
Calcium: 8.3 mg/dL — ABNORMAL LOW (ref 8.9–10.3)
Chloride: 113 mmol/L — ABNORMAL HIGH (ref 101–111)
Creatinine, Ser: 0.72 mg/dL (ref 0.61–1.24)
GFR calc Af Amer: >60 mL/min (ref >60)
Glucose, Bld: 124 mg/dL — ABNORMAL HIGH (ref 65–99)
POTASSIUM: 4 mmol/L (ref 3.5–5.1)
SODIUM: 140 mmol/L (ref 135–145)

## 2017-05-18 LAB — CBC
HCT: 28.5 % — ABNORMAL LOW (ref 39.0–52.0)
Hemoglobin: 9.5 g/dL — ABNORMAL LOW (ref 13.0–17.0)
MCH: 33.7 pg (ref 26.0–34.0)
MCHC: 33.3 g/dL (ref 30.0–36.0)
MCV: 101.1 fL — AB (ref 78.0–100.0)
PLATELETS: 46 10*3/uL — AB (ref 150–400)
RBC: 2.82 MIL/uL — AB (ref 4.22–5.81)
RDW: 15.4 % (ref 11.5–15.5)
WBC: 3 10*3/uL — AB (ref 4.0–10.5)

## 2017-05-18 LAB — GLUCOSE, CAPILLARY
GLUCOSE-CAPILLARY: 102 mg/dL — AB (ref 65–99)
GLUCOSE-CAPILLARY: 129 mg/dL — AB (ref 65–99)

## 2017-05-18 MED ORDER — AMPICILLIN-SULBACTAM IV (FOR PTA / DISCHARGE USE ONLY): 3.0000 g | Freq: Four times a day (QID) | INTRAVENOUS | 0 refills | Status: AC

## 2017-05-18 MED ORDER — RIFAXIMIN 550 MG PO TABS: 550.0000 mg | ORAL_TABLET | Freq: Two times a day (BID) | ORAL | 0 refills | Status: DC

## 2017-05-18 MED ORDER — SODIUM CHLORIDE 0.9% FLUSH: 10.0000 mL | INTRAVENOUS | Status: DC | PRN

## 2017-05-18 NOTE — Discharge Summary (Addendum)
Physician Discharge Summary  Adam Butler:[id number] DOB: 1956/05/10 DOA: 05/12/2017  PCP: [name], NP  Admit date: 05/12/2017 Discharge date: 05/18/2017  Admitted From:home Disposition:home with home care  Recommendations for Outpatient Follow-up:  1. Follow up with PCP in 1-2 weeks 2. Please obtain BMP/CBC in one week  Home Health:yes Equipment/Devices:picc line for iv abx Discharge Condition:stable CODE STATUS:full code Diet recommendation:carb modified heart healthy diet  Brief/Interim Summary: 61 year old male with history of type 2 diabetes, hypertension, CAD, hepatic liver cirrhosis with esophageal varices, history of hepatic encephalopathy presented with a fever, dry cough, lethargy. Admitted for hepatic encephalopathy and bacteremia.  # Streptococcal/ Acinetobacter bacteremia likely GI source/SBP: -Repeat culture negative to date.  Discussed with infectious disease.  Plan to continue IV Unasyn on discharge.  Placed PICC line order for prolonged antibiotics.  TEE with no vegetation.  Recommended to follow-up with GI, infectious disease and PCP.  Patient is clinically improved.  #Acute hepatitic encephalopathy: Continue lactulose. Mental status improved.  #History of liver cirrhosis with esophageal varices: Continue Xifaxan, Aldactone.  SBP. On antibiotics  # Infectious diarrhea: GI pathogen growing Yersinia: Follow up ID.  #Type 2 diabetes: Continue insulin. Monitor blood sugar level.  #Chronic Thrombocytopenia: Platelet count Stable. No sign of bleeding. Morbid obesity  Patient is very eager to go home today.  Discussed with infectious disease plan to discharge with IV antibiotics Unasyn for 2 weeks.  Pharmacy was consulted for outpatient antibiotics ordered.  PICC line for antibiotics.  At this time patient is medically stable to transfer his care to outpatient with close follow-up.  He verbalized understanding.  Discharge Diagnoses:  Principal  Problem:   Fever Active Problems:   Bacteremia   Liver cirrhosis secondary to NASH (HCC)   Hypokalemia   Anemia   Diabetes mellitus type 2, uncontrolled, with complications (HCC)   Thrombocytopenia (HCC)   Ascites   Hyperammonemia (HCC)   Diarrhea of presumed infectious origin   Infection due to acinetobacter baumannii   Streptococcus bovis infection   Acute bacterial endocarditis   SBP (spontaneous bacterial peritonitis) [facility])    Discharge Instructions  Discharge Instructions    Call MD for:  difficulty breathing, headache or visual disturbances   Complete by:  As directed    Call MD for:  extreme fatigue   Complete by:  As directed    Call MD for:  hives   Complete by:  As directed    Call MD for:  persistant dizziness or light-headedness   Complete by:  As directed    Call MD for:  persistant nausea and vomiting   Complete by:  As directed    Call MD for:  redness, tenderness, or signs of infection (pain, swelling, redness, odor or green/yellow discharge around incision site)   Complete by:  As directed    Call MD for:  severe uncontrolled pain   Complete by:  As directed    Call MD for:  temperature >100.4   Complete by:  As directed    Diet - low sodium heart healthy   Complete by:  As directed    Diet Carb Modified   Complete by:  As directed    Home infusion instructions [facility] May follow [place] Dosing Protocol; May administer Cathflo as needed to maintain patency of vascular access device.; Flushing of vascular access device: per [facility] Protocol: 0.9% NaCl pre/post medica...   Complete by:  As directed    Instructions:  May follow [place]  Dosing Protocol   Instructions:  May administer Cathflo as needed to maintain patency of vascular access device.   Instructions:  Flushing of vascular access device: per [facility] Protocol: 0.9% NaCl pre/post medication administration and prn patency; Heparin 100 u/ml, 63m for implanted ports and Heparin 10u/ml,  538mfor all other central venous catheters.   Instructions:  May follow [facility] Anaphylaxis Protocol for First Dose Administration in the home: 0.9% NaCl at 25-50 ml/hr to maintain IV access for protocol meds. Epinephrine 0.3 ml IV/IM PRN and Benadryl 25-50 IV/IM PRN s/s of anaphylaxis.   Instructions:  [facility] Coordinator (RN) to assist per patient IV care needs in the home PRN.   Increase activity slowly   Complete by:  As directed      Allergies as of 05/18/2017      Reactions   Isosorbide Nitrate Other (See Comments)   Nose bleeds      Medication List    STOP taking these medications   glipiZIDE 10 MG tablet Commonly known as:  GLUCOTROL     TAKE these medications   ampicillin-sulbactam IVPB Commonly known as:  UNASYN Inject 3 g every 6 (six) hours for 10 days into the vein. Indication: bacteremia   Last Day of Therapy:  05/28/17 Labs - Once weekly:  CBC/D and BMP   atorvastatin 10 MG tablet Commonly known as:  LIPITOR Take 10 mg by mouth daily.   BAYER WOMENS 81-777 MG Tabs Generic drug:  Aspirin-Calcium Carbonate Take 81 mg by mouth daily.   cyanocobalamin 1000 MCG tablet Take 1,000 mcg by mouth daily. Vitamin B12   fluticasone 50 MCG/ACT nasal spray Commonly known as:  FLONASE Place 2 sprays into both nostrils 3 (three) times daily as needed for allergies or rhinitis (congestion).   Insulin Glargine 100 UNIT/ML Solostar Pen Commonly known as:  LANTUS Inject 8 Units into the skin daily at 10 pm.   insulin regular 100 units/mL injection Commonly known as:  NOVOLIN R,HUMULIN R Inject 25-80 Units into the skin 3 (three) times daily as needed for high blood sugar (CBG >150).   lactulose 10 GM/15ML solution Commonly known as:  CHRONULAC Take 30 mLs (20 g total) by mouth at bedtime.   multivitamin with minerals Tabs tablet Take 1 tablet by mouth daily.   nitroGLYCERIN 0.4 MG SL tablet Commonly known as:  NITROSTAT Place 1 tablet (0.4 mg total)  under the tongue every 5 (five) minutes as needed for chest pain (chest pain).   PARoxetine 10 MG tablet Commonly known as:  PAXIL Take 1 tablet (10 mg total) by mouth daily.   Pen Needles 31G X 8 MM Misc 8 Units by Does not apply route at bedtime.   ranitidine 150 MG tablet Commonly known as:  ZANTAC Take 150 mg by mouth See admin instructions. Take 1 tablet (150 mg) by mouth every morning, may also take 1 tablet at night as needed for acid reflux   rifaximin 550 MG Tabs tablet Commonly known as:  XIFAXAN Take 1 tablet (550 mg total) 2 (two) times daily by mouth.   spironolactone 25 MG tablet Commonly known as:  ALDACTONE Take 0.5 tablets (12.5 mg total) by mouth daily.            Home Infusion Instuctions  (From admission, onward)        Start     Ordered   05/18/17 0000  Home infusion instructions [facility] May follow [place] Protocol; May administer  Cathflo as needed to maintain patency of vascular access device.; Flushing of vascular access device: per [facility] Protocol: 0.9% NaCl pre/post medica...    Question Answer Comment  Instructions May follow [place] Dosing Protocol   Instructions May administer Cathflo as needed to maintain patency of vascular access device.   Instructions Flushing of vascular access device: per [facility] Protocol: 0.9% NaCl pre/post medication administration and prn patency; Heparin 100 u/ml, 41m for implanted ports and Heparin 10u/ml, 522mfor all other central venous catheters.   Instructions May follow [facility] Anaphylaxis Protocol for First Dose Administration in the home: 0.9% NaCl at 25-50 ml/hr to maintain IV access for protocol meds. Epinephrine 0.3 ml IV/IM PRN and Benadryl 25-50 IV/IM PRN s/s of anaphylaxis.   Instructions [facility] Infusion Coordinator (RN) to assist per patient IV care needs in the home PRN.      05/18/17 1014     Follow-up Information    BaJearld FentonNP. Schedule an appointment as soon as  possible for a visit in 1 week(s).   Specialty:  Internal Medicine Contact information: [address] [phone]        PyJerene BearsMD. Schedule an appointment as soon as possible for a visit in 2 week(s).   Specialty:  Gastroenterology Contact information: [phone]        [place], Cornelius N, MD Follow up.   Specialty:  Infectious Diseases Why:  for infection related Contact information: [phone]        Allergies  Allergen Reactions  . Isosorbide Nitrate Other (See Comments)    Nose bleeds    Consultations: GI Cardiology Infectious disease  Procedures/Studies: TEE  Subjective: Seen and examined at bedside.  Eager to go home.  Denies headache, dizziness, nausea vomiting chest pain shortness of breath.  Discharge Exam: Vitals:   05/17/17 2223 05/18/17 0552  BP: (!) 110/55 (!) 105/41  Pulse: 62 (!) 57  Resp: 18 18  Temp: 98 F (36.7 C) 97.9 F (36.6 C)  SpO2: 98% 96%   Vitals:   05/17/17 1500 05/17/17 1543 05/17/17 2223 05/18/17 0552  BP:  (!) 127/54 (!) 110/55 (!) 105/41  Pulse: 68 (!) 59 62 (!) 57  Resp: 16 18 18 18   Temp:  97.8 F (36.6 C) 98 F (36.7 C) 97.9 F (36.6 C)  TempSrc:  Oral Oral Oral  SpO2: 95% 99% 98% 96%  Weight:    125.2 kg (276 lb 0.3 oz)  Height:        General: Pt is alert, awake, not in acute distress Cardiovascular: RRR, S1/S2 +, no rubs, no gallops Respiratory: CTA bilaterally, no wheezing, no rhonchi Abdominal: Soft, NT, ND, bowel sounds + Extremities: no edema, no cyanosis    The results of significant diagnostics from this hospitalization (including imaging, microbiology, ancillary and laboratory) are listed below for reference.     Microbiology: Recent Results (from the past 240 hour(s))  Culture, blood (Routine x 2)     Status: Abnormal   Collection Time: 05/12/17  3:03 PM   Result Value Ref Range Status   Specimen Description BLOOD RIGHT HAND  Final   Special Requests   Final    BOTTLES DRAWN AEROBIC AND ANAEROBIC Blood Culture adequate volume   Culture  Setup Time   Final    GRAM POSITIVE COCCI IN PAIRS AND CHAINS IN BOTH AEROBIC  AND ANAEROBIC BOTTLES CRITICAL RESULT CALLED TO, READ BACK BY AND VERIFIED WITH: N GLOGOVAC,PHARMD AT 0725 05/13/17 BY L BENFIELD Performed at [facility], [place] [address], [place], [place] [address]    Culture STREPTOCOCCUS GALLOLYTICUS (A)  Final   Report Status 05/15/2017 FINAL  Final   Organism ID, Bacteria STREPTOCOCCUS GALLOLYTICUS  Final      Susceptibility   Streptococcus gallolyticus - MIC*    PENICILLIN 0.12 SENSITIVE Sensitive     CEFTRIAXONE 0.25 SENSITIVE Sensitive     ERYTHROMYCIN <=0.12 SENSITIVE Sensitive     LEVOFLOXACIN 4 INTERMEDIATE Intermediate     VANCOMYCIN 0.25 SENSITIVE Sensitive     * STREPTOCOCCUS GALLOLYTICUS  Blood Culture ID Panel (Reflexed)     Status: Abnormal   Collection Time: 05/12/17  3:03 PM  Result Value Ref Range Status   Enterococcus species NOT DETECTED NOT DETECTED Final   Listeria monocytogenes NOT DETECTED NOT DETECTED Final   Staphylococcus species NOT DETECTED NOT DETECTED Final   Staphylococcus aureus NOT DETECTED NOT DETECTED Final   Streptococcus species DETECTED (A) NOT DETECTED Final    Comment: Not Enterococcus species, Streptococcus agalactiae, Streptococcus pyogenes, or Streptococcus pneumoniae. CRITICAL RESULT CALLED TO, READ BACK BY AND VERIFIED WITH: N GLOGOVAC,PHARMD AT 0725 05/13/17 BY L BENFIELD    Streptococcus agalactiae NOT DETECTED NOT DETECTED Final   Streptococcus pneumoniae NOT DETECTED NOT DETECTED Final   Streptococcus pyogenes NOT DETECTED NOT DETECTED Final   Acinetobacter baumannii NOT DETECTED NOT DETECTED Final   Enterobacteriaceae species NOT DETECTED NOT DETECTED Final   Enterobacter cloacae complex NOT DETECTED NOT DETECTED Final    Escherichia coli NOT DETECTED NOT DETECTED Final   Klebsiella oxytoca NOT DETECTED NOT DETECTED Final   Klebsiella pneumoniae NOT DETECTED NOT DETECTED Final   Proteus species NOT DETECTED NOT DETECTED Final   Serratia marcescens NOT DETECTED NOT DETECTED Final   Haemophilus influenzae NOT DETECTED NOT DETECTED Final   Neisseria meningitidis NOT DETECTED NOT DETECTED Final   Pseudomonas aeruginosa NOT DETECTED NOT DETECTED Final   Candida albicans NOT DETECTED NOT DETECTED Final   Candida glabrata NOT DETECTED NOT DETECTED Final   Candida krusei NOT DETECTED NOT DETECTED Final   Candida parapsilosis NOT DETECTED NOT DETECTED Final   Candida tropicalis NOT DETECTED NOT DETECTED Final    Comment: Performed at [facility], [place] [address], [place], [place] [address]  Culture, blood (Routine x 2)     Status: Abnormal   Collection Time: 05/12/17  3:55 PM  Result Value Ref Range Status   Specimen Description BLOOD LEFT HAND  Final   Special Requests   Final    BOTTLES DRAWN AEROBIC AND ANAEROBIC Blood Culture adequate volume   Culture  Setup Time   Final    GRAM POSITIVE COCCI IN PAIRS AND CHAINS IN BOTH AEROBIC AND ANAEROBIC BOTTLES CRITICAL VALUE NOTED.  VALUE IS CONSISTENT WITH PREVIOUSLY REPORTED AND CALLED VALUE.    Culture (A)  Final    STREPTOCOCCUS GALLOLYTICUS ACINETOBACTER CALCOACETICUS/BAUMANNII COMPLEX CRITICAL RESULT CALLED TO, READ BACK BY AND VERIFIED WITH: J GADHIA,PHARMD AT 1010 05/14/17 BY L BENFIELD CONCERNING GROWTH ON CULTURE SUSCEPTIBILITIES PERFORMED ON PREVIOUS CULTURE WITHIN THE LAST 5 DAYS. ON STREPTOCOCCUS GALLOLYTICUS Performed at [facility], [place] [address], [place], [place] [address]    Report Status 05/15/2017 FINAL  Final   Organism ID, Bacteria ACINETOBACTER CALCOACETICUS/BAUMANNII COMPLEX  Final      Susceptibility   Acinetobacter calcoaceticus/baumannii complex - MIC*  CEFTAZIDIME 4 SENSITIVE Sensitive     CEFTRIAXONE 16 INTERMEDIATE  Intermediate     CIPROFLOXACIN <=0.25 SENSITIVE Sensitive     GENTAMICIN <=1 SENSITIVE Sensitive     IMIPENEM <=0.25 SENSITIVE Sensitive     PIP/TAZO 32 INTERMEDIATE Intermediate     TRIMETH/SULFA 160 RESISTANT Resistant     CEFEPIME 4 SENSITIVE Sensitive     AMPICILLIN/SULBACTAM <=2 SENSITIVE Sensitive     * ACINETOBACTER CALCOACETICUS/BAUMANNII COMPLEX  Respiratory Panel by PCR     Status: None   Collection Time: 05/12/17  7:58 PM  Result Value Ref Range Status   Adenovirus NOT DETECTED NOT DETECTED Final   Coronavirus 229E NOT DETECTED NOT DETECTED Final   Coronavirus HKU1 NOT DETECTED NOT DETECTED Final   Coronavirus NL63 NOT DETECTED NOT DETECTED Final   Coronavirus OC43 NOT DETECTED NOT DETECTED Final   Metapneumovirus NOT DETECTED NOT DETECTED Final   Rhinovirus / Enterovirus NOT DETECTED NOT DETECTED Final   Influenza A NOT DETECTED NOT DETECTED Final   Influenza B NOT DETECTED NOT DETECTED Final   Parainfluenza Virus 1 NOT DETECTED NOT DETECTED Final   Parainfluenza Virus 2 NOT DETECTED NOT DETECTED Final   Parainfluenza Virus 3 NOT DETECTED NOT DETECTED Final   Parainfluenza Virus 4 NOT DETECTED NOT DETECTED Final   Respiratory Syncytial Virus NOT DETECTED NOT DETECTED Final   Bordetella pertussis NOT DETECTED NOT DETECTED Final   Chlamydophila pneumoniae NOT DETECTED NOT DETECTED Final   Mycoplasma pneumoniae NOT DETECTED NOT DETECTED Final    Comment: Performed at [facility], [place] [address] [place], [place] [address]  Gastrointestinal Panel by PCR , Stool     Status: Abnormal   Collection Time: 05/12/17  7:58 PM  Result Value Ref Range Status   Campylobacter species NOT DETECTED NOT DETECTED Final   Plesimonas shigelloides NOT DETECTED NOT DETECTED Final   Salmonella species NOT DETECTED NOT DETECTED Final   Yersinia enterocolitica DETECTED (A) NOT DETECTED Final    Comment: RESULT CALLED TO, READ BACK BY AND VERIFIED WITH:  Randa Lynn AT 0624 05/14/17  SDR    Vibrio species NOT DETECTED NOT DETECTED Final   Vibrio cholerae NOT DETECTED NOT DETECTED Final   Enteroaggregative E coli (EAEC) NOT DETECTED NOT DETECTED Final   Enteropathogenic E coli (EPEC) NOT DETECTED NOT DETECTED Final   Enterotoxigenic E coli (ETEC) NOT DETECTED NOT DETECTED Final   Shiga like toxin producing E coli (STEC) NOT DETECTED NOT DETECTED Final   Shigella/Enteroinvasive E coli (EIEC) NOT DETECTED NOT DETECTED Final   Cryptosporidium NOT DETECTED NOT DETECTED Final   Cyclospora cayetanensis NOT DETECTED NOT DETECTED Final   Entamoeba histolytica NOT DETECTED NOT DETECTED Final   Giardia lamblia NOT DETECTED NOT DETECTED Final   Adenovirus F40/41 NOT DETECTED NOT DETECTED Final   Astrovirus NOT DETECTED NOT DETECTED Final   Norovirus GI/GII NOT DETECTED NOT DETECTED Final   Rotavirus A NOT DETECTED NOT DETECTED Final   Sapovirus (I, II, IV, and V) NOT DETECTED NOT DETECTED Final  Culture, blood (routine x 2)     Status: None (Preliminary result)   Collection Time: 05/14/17 11:42 AM  Result Value Ref Range Status   Specimen Description BLOOD RIGHT ANTECUBITAL  Final   Special Requests   Final    BOTTLES DRAWN AEROBIC ONLY Blood Culture adequate volume   Culture   Final    NO GROWTH 3 DAYS Performed at [facility], [address] [place], [place] [address]  Report Status PENDING  Incomplete  Culture, blood (routine x 2)     Status: None (Preliminary result)   Collection Time: 05/14/17 11:42 AM  Result Value Ref Range Status   Specimen Description BLOOD BLOOD RIGHT FOREARM  Final   Special Requests IN PEDIATRIC BOTTLE Blood Culture adequate volume  Final   Culture   Final    NO GROWTH 3 DAYS Performed at [facility], [place] [address], [place], [place] [address]    Report Status PENDING  Incomplete  Culture, body fluid-bottle     Status: None (Preliminary result)   Collection Time: 05/15/17  4:07 PM  Result Value Ref Range Status    Specimen Description PERITONEAL  Final   Special Requests NONE  Final   Culture   Final    NO GROWTH 2 DAYS Performed at [facility], [address], [place], [place] [address]    Report Status PENDING  Incomplete  Gram stain     Status: None   Collection Time: 05/15/17  4:07 PM  Result Value Ref Range Status   Specimen Description PERITONEAL  Final   Special Requests NONE  Final   Gram Stain   Final    WBC PRESENT, PREDOMINANTLY PMN NO ORGANISMS SEEN CYTOSPIN SMEAR Performed at [facility], [place] [address], [place], [place] [address]    Report Status 05/16/2017 FINAL  Final     Labs: BNP (last 3 results) Recent Labs    03/02/17 1009  BNP 82.9   Basic Metabolic Panel: Recent Labs  Lab 05/12/17 1502 05/12/17 1609 05/13/17 0533 05/15/17 0739 05/17/17 0437 05/18/17 0457  NA 134*  --  138 139 137 140  K 2.8*  --  3.6 3.8 3.9 4.0  CL 104  --  107 109 109 113*  CO2 24  --  25 25 21* 23  GLUCOSE 180*  --  107* 105* 126* 124*  BUN 7  --  8 10 9 9   CREATININE 0.81  --  0.65 0.73 0.67 0.72  CALCIUM 7.1*  --  7.2* 8.1* 8.2* 8.3*  MG  --  1.3*  --   --   --   --    Liver Function Tests: Recent Labs  Lab 05/12/17 1502 05/13/17 0533  AST 66* 63*  ALT 39 34  ALKPHOS 125 94  BILITOT 2.4* 3.4*  PROT 6.0* 5.6*  ALBUMIN 2.1* 1.9*   No results for input(s): LIPASE, AMYLASE in the last 168 hours. Recent Labs  Lab 05/12/17 1552 05/13/17 0533  AMMONIA 80* 55*   CBC: Recent Labs  Lab 05/12/17 1502 05/13/17 0533 05/15/17 0739 05/17/17 0437 05/18/17 0457  WBC 9.6 7.7 3.7* 2.5* 3.0*  NEUTROABS 7.7  --   --   --   --   HGB 10.5* 10.1* 9.8* 9.4* 9.5*  HCT 30.0* 29.6* 28.9* 27.6* 28.5*  MCV 98.4 100.0 99.0 100.4* 101.1*  PLT 46* 30* 50* 49* 46*   Cardiac Enzymes: No results for input(s): CKTOTAL, CKMB, CKMBINDEX, TROPONINI in the last 168 hours. BNP: Invalid input(s): POCBNP CBG: Recent Labs  Lab 05/17/17 0751 05/17/17 1150 05/17/17 1608  05/17/17 2220 05/18/17 0759  GLUCAP 112* 109* 92 153* 102*   D-Dimer No results for input(s): DDIMER in the last 72 hours. Hgb A1c No results for input(s): HGBA1C in the last 72 hours. Lipid Profile No results for input(s): CHOL, HDL, LDLCALC, TRIG, CHOLHDL, LDLDIRECT in the last 72 hours. Thyroid function studies No results  for input(s): TSH, T4TOTAL, T3FREE, THYROIDAB in the last 72 hours.  Invalid input(s): FREET3 Anemia work up No results for input(s): VITAMINB12, FOLATE, FERRITIN, TIBC, IRON, RETICCTPCT in the last 72 hours. Urinalysis    Component Value Date/Time   COLORURINE AMBER (A) 05/12/2017 1735   APPEARANCEUR CLEAR 05/12/2017 1735   LABSPEC 1.018 05/12/2017 1735   PHURINE 6.0 05/12/2017 1735   GLUCOSEU NEGATIVE 05/12/2017 1735   HGBUR MODERATE (A) 05/12/2017 1735   BILIRUBINUR NEGATIVE 05/12/2017 1735   KETONESUR NEGATIVE 05/12/2017 1735   PROTEINUR NEGATIVE 05/12/2017 1735   UROBILINOGEN 0.2 05/14/2015 1813   NITRITE NEGATIVE 05/12/2017 1735   LEUKOCYTESUR NEGATIVE 05/12/2017 1735   Sepsis Labs Invalid input(s): PROCALCITONIN,  WBC,  LACTICIDVEN Microbiology Recent Results (from the past 240 hour(s))  Culture, blood (Routine x 2)     Status: Abnormal   Collection Time: 05/12/17  3:03 PM  Result Value Ref Range Status   Specimen Description BLOOD RIGHT HAND  Final   Special Requests   Final    BOTTLES DRAWN AEROBIC AND ANAEROBIC Blood Culture adequate volume   Culture  Setup Time   Final    GRAM POSITIVE COCCI IN PAIRS AND CHAINS IN BOTH AEROBIC AND ANAEROBIC BOTTLES CRITICAL RESULT CALLED TO, READ BACK BY AND VERIFIED WITH: N GLOGOVAC,PHARMD AT 0725 05/13/17 BY L BENFIELD Performed at [facility], [place] [address], [place], [place] [address]    Culture STREPTOCOCCUS GALLOLYTICUS (A)  Final   Report Status 05/15/2017 FINAL  Final   Organism ID, Bacteria STREPTOCOCCUS GALLOLYTICUS  Final      Susceptibility   Streptococcus gallolyticus - MIC*     PENICILLIN 0.12 SENSITIVE Sensitive     CEFTRIAXONE 0.25 SENSITIVE Sensitive     ERYTHROMYCIN <=0.12 SENSITIVE Sensitive     LEVOFLOXACIN 4 INTERMEDIATE Intermediate     VANCOMYCIN 0.25 SENSITIVE Sensitive     * STREPTOCOCCUS GALLOLYTICUS  Blood Culture ID Panel (Reflexed)     Status: Abnormal   Collection Time: 05/12/17  3:03 PM  Result Value Ref Range Status   Enterococcus species NOT DETECTED NOT DETECTED Final   Listeria monocytogenes NOT DETECTED NOT DETECTED Final   Staphylococcus species NOT DETECTED NOT DETECTED Final   Staphylococcus aureus NOT DETECTED NOT DETECTED Final   Streptococcus species DETECTED (A) NOT DETECTED Final    Comment: Not Enterococcus species, Streptococcus agalactiae, Streptococcus pyogenes, or Streptococcus pneumoniae. CRITICAL RESULT CALLED TO, READ BACK BY AND VERIFIED WITH: N GLOGOVAC,PHARMD AT 0725 05/13/17 BY L BENFIELD    Streptococcus agalactiae NOT DETECTED NOT DETECTED Final   Streptococcus pneumoniae NOT DETECTED NOT DETECTED Final   Streptococcus pyogenes NOT DETECTED NOT DETECTED Final   Acinetobacter baumannii NOT DETECTED NOT DETECTED Final   Enterobacteriaceae species NOT DETECTED NOT DETECTED Final   Enterobacter cloacae complex NOT DETECTED NOT DETECTED Final   Escherichia coli NOT DETECTED NOT DETECTED Final   Klebsiella oxytoca NOT DETECTED NOT DETECTED Final   Klebsiella pneumoniae NOT DETECTED NOT DETECTED Final   Proteus species NOT DETECTED NOT DETECTED Final   Serratia marcescens NOT DETECTED NOT DETECTED Final   Haemophilus influenzae NOT DETECTED NOT DETECTED Final   Neisseria meningitidis NOT DETECTED NOT DETECTED Final   Pseudomonas aeruginosa NOT DETECTED NOT DETECTED Final   Candida albicans NOT DETECTED NOT DETECTED Final   Candida glabrata NOT DETECTED NOT DETECTED Final   Candida krusei NOT DETECTED NOT DETECTED Final   Candida parapsilosis NOT DETECTED NOT DETECTED Final   Candida tropicalis NOT  DETECTED NOT DETECTED  Final    Comment: Performed at [facility], [place] [address], [place], [place] [address]  Culture, blood (Routine x 2)     Status: Abnormal   Collection Time: 05/12/17  3:55 PM  Result Value Ref Range Status   Specimen Description BLOOD LEFT HAND  Final   Special Requests   Final    BOTTLES DRAWN AEROBIC AND ANAEROBIC Blood Culture adequate volume   Culture  Setup Time   Final    GRAM POSITIVE COCCI IN PAIRS AND CHAINS IN BOTH AEROBIC AND ANAEROBIC BOTTLES CRITICAL VALUE NOTED.  VALUE IS CONSISTENT WITH PREVIOUSLY REPORTED AND CALLED VALUE.    Culture (A)  Final    STREPTOCOCCUS GALLOLYTICUS ACINETOBACTER CALCOACETICUS/BAUMANNII COMPLEX CRITICAL RESULT CALLED TO, READ BACK BY AND VERIFIED WITH: J GADHIA,PHARMD AT 1010 05/14/17 BY L BENFIELD CONCERNING GROWTH ON CULTURE SUSCEPTIBILITIES PERFORMED ON PREVIOUS CULTURE WITHIN THE LAST 5 DAYS. ON STREPTOCOCCUS GALLOLYTICUS Performed at [facility], [place] [address] [address] [place], [place] [address]    Report Status 05/15/2017 FINAL  Final   Organism ID, Bacteria ACINETOBACTER CALCOACETICUS/BAUMANNII COMPLEX  Final      Susceptibility   Acinetobacter calcoaceticus/baumannii complex - MIC*    CEFTAZIDIME 4 SENSITIVE Sensitive     CEFTRIAXONE 16 INTERMEDIATE Intermediate     CIPROFLOXACIN <=0.25 SENSITIVE Sensitive     GENTAMICIN <=1 SENSITIVE Sensitive     IMIPENEM <=0.25 SENSITIVE Sensitive     PIP/TAZO 32 INTERMEDIATE Intermediate     TRIMETH/SULFA 160 RESISTANT Resistant     CEFEPIME 4 SENSITIVE Sensitive     AMPICILLIN/SULBACTAM <=2 SENSITIVE Sensitive     * ACINETOBACTER CALCOACETICUS/BAUMANNII COMPLEX  Respiratory Panel by PCR     Status: None   Collection Time: 05/12/17  7:58 PM  Result Value Ref Range Status   Adenovirus NOT DETECTED NOT DETECTED Final   Coronavirus 229E NOT DETECTED NOT DETECTED Final   Coronavirus HKU1 NOT DETECTED NOT DETECTED Final   Coronavirus NL63 NOT DETECTED NOT DETECTED Final   Coronavirus OC43  NOT DETECTED NOT DETECTED Final   Metapneumovirus NOT DETECTED NOT DETECTED Final   Rhinovirus / Enterovirus NOT DETECTED NOT DETECTED Final   Influenza A NOT DETECTED NOT DETECTED Final   Influenza B NOT DETECTED NOT DETECTED Final   Parainfluenza Virus 1 NOT DETECTED NOT DETECTED Final   Parainfluenza Virus 2 NOT DETECTED NOT DETECTED Final   Parainfluenza Virus 3 NOT DETECTED NOT DETECTED Final   Parainfluenza Virus 4 NOT DETECTED NOT DETECTED Final   Respiratory Syncytial Virus NOT DETECTED NOT DETECTED Final   Bordetella pertussis NOT DETECTED NOT DETECTED Final   Chlamydophila pneumoniae NOT DETECTED NOT DETECTED Final   Mycoplasma pneumoniae NOT DETECTED NOT DETECTED Final    Comment: Performed at [facility], [place] [address], [place],  [address]  Gastrointestinal Panel by PCR , Stool     Status: Abnormal   Collection Time: 05/12/17  7:58 PM  Result Value Ref Range Status   Campylobacter species NOT DETECTED NOT DETECTED Final   Plesimonas shigelloides NOT DETECTED NOT DETECTED Final   Salmonella species NOT DETECTED NOT DETECTED Final   Yersinia enterocolitica DETECTED (A) NOT DETECTED Final    Comment: RESULT CALLED TO, READ BACK BY AND VERIFIED WITH:  Randa Lynn AT 1093 05/14/17 SDR    Vibrio species NOT DETECTED NOT DETECTED Final   Vibrio cholerae NOT DETECTED NOT DETECTED Final   Enteroaggregative E coli (EAEC) NOT DETECTED NOT DETECTED  Final   Enteropathogenic E coli (EPEC) NOT DETECTED NOT DETECTED Final   Enterotoxigenic E coli (ETEC) NOT DETECTED NOT DETECTED Final   Shiga like toxin producing E coli (STEC) NOT DETECTED NOT DETECTED Final   Shigella/Enteroinvasive E coli (EIEC) NOT DETECTED NOT DETECTED Final   Cryptosporidium NOT DETECTED NOT DETECTED Final   Cyclospora cayetanensis NOT DETECTED NOT DETECTED Final   Entamoeba histolytica NOT DETECTED NOT DETECTED Final   Giardia lamblia NOT DETECTED NOT DETECTED Final   Adenovirus F40/41 NOT  DETECTED NOT DETECTED Final   Astrovirus NOT DETECTED NOT DETECTED Final   Norovirus GI/GII NOT DETECTED NOT DETECTED Final   Rotavirus A NOT DETECTED NOT DETECTED Final   Sapovirus (I, II, IV, and V) NOT DETECTED NOT DETECTED Final  Culture, blood (routine x 2)     Status: None (Preliminary result)   Collection Time: 05/14/17 11:42 AM  Result Value Ref Range Status   Specimen Description BLOOD RIGHT ANTECUBITAL  Final   Special Requests   Final    BOTTLES DRAWN AEROBIC ONLY Blood Culture adequate volume   Culture   Final    NO GROWTH 3 DAYS Performed at [facility], [address], [place], [place] [address]    Report Status PENDING  Incomplete  Culture, blood (routine x 2)     Status: None (Preliminary result)   Collection Time: 05/14/17 11:42 AM  Result Value Ref Range Status   Specimen Description BLOOD BLOOD RIGHT FOREARM  Final   Special Requests IN PEDIATRIC BOTTLE Blood Culture adequate volume  Final   Culture   Final    NO GROWTH 3 DAYS Performed at [facility], [place] [address], [place], [place] [address]    Report Status PENDING  Incomplete  Culture, body fluid-bottle     Status: None (Preliminary result)   Collection Time: 05/15/17  4:07 PM  Result Value Ref Range Status   Specimen Description PERITONEAL  Final   Special Requests NONE  Final   Culture   Final    NO GROWTH 2 DAYS Performed at [facility], [address], [place], [place] [address]    Report Status PENDING  Incomplete  Gram stain     Status: None   Collection Time: 05/15/17  4:07 PM  Result Value Ref Range Status   Specimen Description PERITONEAL  Final   Special Requests NONE  Final   Gram Stain   Final    WBC PRESENT, PREDOMINANTLY PMN NO ORGANISMS SEEN CYTOSPIN SMEAR Performed at [facility], [place] [address] [place], [place] [address]    Report Status 05/16/2017 FINAL  Final     Time coordinating discharge: 34 minutes  SIGNED:   Rosita Fire,  MD  [facility] 05/18/2017, 10:14 AM  If 7PM-7AM, please contact night-coverage [email or web address] Password [email or web address]

## 2017-05-18 NOTE — Progress Notes (Signed)
PHARMACY CONSULT NOTE FOR:  OUTPATIENT  PARENTERAL ANTIBIOTIC THERAPY (OPAT)  Indication: bacteremia Regimen: unasyn 3gm IV q6h End date: 05/28/17  IV antibiotic discharge orders are pended. To discharging provider:  please sign these orders via discharge navigator,  Select New Orders & click on the button choice - Manage This Unsigned Work.     Thank you for allowing pharmacy to be a part of this patient's care.  Lynelle Doctor 05/18/2017, 8:29 AM

## 2017-05-18 NOTE — Progress Notes (Signed)
Subjective:  No new complaints   Antibiotics:  Anti-infectives (From admission, onward)   Start     Dose/Rate Route Frequency Ordered Stop   05/18/17 0000  ampicillin-sulbactam (UNASYN) IVPB     3 g Intravenous Every 6 hours 05/18/17 1014 05/28/17 2359   05/18/17 0000  rifaximin (XIFAXAN) 550 MG TABS tablet     550 mg Oral 2 times daily 05/18/17 1014     05/16/17 1000  Ampicillin-Sulbactam (UNASYN) 3 g in sodium chloride 0.9 % 100 mL IVPB     3 g 200 mL/hr over 30 Minutes Intravenous Every 6 hours 05/16/17 0857     05/14/17 1800  imipenem-cilastatin (PRIMAXIN) 500 mg in sodium chloride 0.9 % 100 mL IVPB  Status:  Discontinued     500 mg 200 mL/hr over 30 Minutes Intravenous Every 6 hours 05/14/17 1702 05/16/17 0838   05/13/17 1000  cefTRIAXone (ROCEPHIN) 2 g in dextrose 5 % 50 mL IVPB  Status:  Discontinued     2 g 100 mL/hr over 30 Minutes Intravenous Every 24 hours 05/13/17 0851 05/14/17 1657   05/13/17 0300  piperacillin-tazobactam (ZOSYN) IVPB 3.375 g  Status:  Discontinued     3.375 g 12.5 mL/hr over 240 Minutes Intravenous Every 8 hours 05/13/17 0246 05/13/17 0844   05/12/17 2359  vancomycin (VANCOCIN) 1,250 mg in sodium chloride 0.9 % 250 mL IVPB  Status:  Discontinued     1,250 mg 166.7 mL/hr over 90 Minutes Intravenous Every 12 hours 05/12/17 2150 05/13/17 0844   05/12/17 2200  rifaximin (XIFAXAN) tablet 550 mg     550 mg Oral 2 times daily 05/12/17 1951     05/12/17 1600  piperacillin-tazobactam (ZOSYN) IVPB 3.375 g     3.375 g 100 mL/hr over 30 Minutes Intravenous  Once 05/12/17 1547 05/12/17 1623   05/12/17 1600  vancomycin (VANCOCIN) IVPB 1000 mg/200 mL premix     1,000 mg 200 mL/hr over 60 Minutes Intravenous  Once 05/12/17 1547 05/12/17 1738      Medications: Scheduled Meds: . aspirin EC  81 mg Oral Daily   And  . calcium carbonate  1 tablet Oral Daily  . atorvastatin  10 mg Oral Daily  . famotidine  10 mg Oral BID  . feeding supplement  (PRO-STAT SUGAR FREE 64)  30 mL Oral BID  . insulin aspart  0-5 Units Subcutaneous QHS  . insulin aspart  0-9 Units Subcutaneous TID WC  . insulin glargine  8 Units Subcutaneous QHS  . lactulose  20 g Oral QHS  . multivitamin with minerals  1 tablet Oral Daily  . PARoxetine  10 mg Oral Daily  . rifaximin  550 mg Oral BID  . sodium chloride flush  3 mL Intravenous Q12H  . spironolactone  12.5 mg Oral Daily  . vitamin B-12  1,000 mcg Oral Daily   Continuous Infusions: . sodium chloride    . ampicillin-sulbactam (UNASYN) IV Stopped (05/18/17 1101)   PRN Meds:.sodium chloride, fluticasone, nitroGLYCERIN, sodium chloride flush    Objective: Weight change: -14.2 oz (-0.401 kg)  Intake/Output Summary (Last 24 hours) at 05/18/2017 1112 Last data filed at 05/18/2017 0552 Gross per 24 hour  Intake 240 ml  Output -  Net 240 ml   Blood pressure (!) 105/41, pulse (!) 57, temperature 97.9 F (36.6 C), temperature source Oral, resp. rate 18, height 5' 7"  (1.702 m), weight 276 lb 0.3 oz (125.2 kg), SpO2 96 %. Temp:  [97.7  F (36.5 C)-98 F (36.7 C)] 97.9 F (36.6 C) (11/08 0552) Pulse Rate:  [57-68] 57 (11/08 0552) Resp:  [16-23] 18 (11/08 0552) BP: (85-127)/(27-59) 105/41 (11/08 0552) SpO2:  [92 %-99 %] 96 % (11/08 0552) Weight:  [276 lb 0.3 oz (125.2 kg)] 276 lb 0.3 oz (125.2 kg) (11/08 0552)  Physical Exam:  Gen ao x 3 nad CV: RRR, no mgr Pulm: CTAB QZ:ESPQ distended Neuro nonfocal  CBC:  CBC Latest Ref Rng & Units 05/18/2017 05/17/2017 05/15/2017  WBC 4.0 - 10.5 K/uL 3.0(L) 2.5(L) 3.7(L)  Hemoglobin 13.0 - 17.0 g/dL 9.5(L) 9.4(L) 9.8(L)  Hematocrit 39.0 - 52.0 % 28.5(L) 27.6(L) 28.9(L)  Platelets 150 - 400 K/uL 46(L) 49(L) 50(L)      BMET Recent Labs    05/17/17 0437 05/18/17 0457  NA 137 140  K 3.9 4.0  CL 109 113*  CO2 21* 23  GLUCOSE 126* 124*  BUN 9 9  CREATININE 0.67 0.72  CALCIUM 8.2* 8.3*     Liver Panel  No results for input(s): PROT, ALBUMIN,  AST, ALT, ALKPHOS, BILITOT, BILIDIR, IBILI in the last 72 hours.     Sedimentation Rate No results for input(s): ESRSEDRATE in the last 72 hours. C-Reactive Protein No results for input(s): CRP in the last 72 hours.  Micro Results: Recent Results (from the past 720 hour(s))  Blood Culture (routine x 2)     Status: None   Collection Time: 04/27/17  6:40 PM  Result Value Ref Range Status   Specimen Description BLOOD LEFT HAND  Final   Special Requests   Final    BOTTLES DRAWN AEROBIC AND ANAEROBIC Blood Culture adequate volume   Culture NO GROWTH 5 DAYS  Final   Report Status 05/02/2017 FINAL  Final  Blood Culture (routine x 2)     Status: None   Collection Time: 04/27/17  6:55 PM  Result Value Ref Range Status   Specimen Description BLOOD BLOOD RIGHT FOREARM  Final   Special Requests IN PEDIATRIC BOTTLE Blood Culture adequate volume  Final   Culture NO GROWTH 5 DAYS  Final   Report Status 05/02/2017 FINAL  Final  Urine culture     Status: None   Collection Time: 04/27/17  8:15 PM  Result Value Ref Range Status   Specimen Description URINE, CATHETERIZED  Final   Special Requests NONE  Final   Culture NO GROWTH  Final   Report Status 04/29/2017 FINAL  Final  MRSA PCR Screening     Status: None   Collection Time: 04/27/17 11:36 PM  Result Value Ref Range Status   MRSA by PCR NEGATIVE NEGATIVE Final    Comment:        The GeneXpert MRSA Assay (FDA approved for NASAL specimens only), is one component of a comprehensive MRSA colonization surveillance program. It is not intended to diagnose MRSA infection nor to guide or monitor treatment for MRSA infections.   Respiratory Panel by PCR     Status: None   Collection Time: 04/28/17  9:58 AM  Result Value Ref Range Status   Adenovirus NOT DETECTED NOT DETECTED Final   Coronavirus 229E NOT DETECTED NOT DETECTED Final   Coronavirus HKU1 NOT DETECTED NOT DETECTED Final   Coronavirus NL63 NOT DETECTED NOT DETECTED Final    Coronavirus OC43 NOT DETECTED NOT DETECTED Final   Metapneumovirus NOT DETECTED NOT DETECTED Final   Rhinovirus / Enterovirus NOT DETECTED NOT DETECTED Final   Influenza A NOT DETECTED NOT DETECTED Final  Influenza B NOT DETECTED NOT DETECTED Final   Parainfluenza Virus 1 NOT DETECTED NOT DETECTED Final   Parainfluenza Virus 2 NOT DETECTED NOT DETECTED Final   Parainfluenza Virus 3 NOT DETECTED NOT DETECTED Final   Parainfluenza Virus 4 NOT DETECTED NOT DETECTED Final   Respiratory Syncytial Virus NOT DETECTED NOT DETECTED Final   Bordetella pertussis NOT DETECTED NOT DETECTED Final   Chlamydophila pneumoniae NOT DETECTED NOT DETECTED Final   Mycoplasma pneumoniae NOT DETECTED NOT DETECTED Final  Culture, blood (Routine x 2)     Status: Abnormal   Collection Time: 05/12/17  3:03 PM  Result Value Ref Range Status   Specimen Description BLOOD RIGHT HAND  Final   Special Requests   Final    BOTTLES DRAWN AEROBIC AND ANAEROBIC Blood Culture adequate volume   Culture  Setup Time   Final    GRAM POSITIVE COCCI IN PAIRS AND CHAINS IN BOTH AEROBIC AND ANAEROBIC BOTTLES CRITICAL RESULT CALLED TO, READ BACK BY AND VERIFIED WITH: N GLOGOVAC,PHARMD AT 0725 05/13/17 BY L BENFIELD Performed at [facility], [place] [address] [place], [place] [address]    Culture STREPTOCOCCUS GALLOLYTICUS (A)  Final   Report Status 05/15/2017 FINAL  Final   Organism ID, Bacteria STREPTOCOCCUS GALLOLYTICUS  Final      Susceptibility   Streptococcus gallolyticus - MIC*    PENICILLIN 0.12 SENSITIVE Sensitive     CEFTRIAXONE 0.25 SENSITIVE Sensitive     ERYTHROMYCIN <=0.12 SENSITIVE Sensitive     LEVOFLOXACIN 4 INTERMEDIATE Intermediate     VANCOMYCIN 0.25 SENSITIVE Sensitive     * STREPTOCOCCUS GALLOLYTICUS  Blood Culture ID Panel (Reflexed)     Status: Abnormal   Collection Time: 05/12/17  3:03 PM  Result Value Ref Range Status   Enterococcus species NOT DETECTED NOT DETECTED Final   Listeria  monocytogenes NOT DETECTED NOT DETECTED Final   Staphylococcus species NOT DETECTED NOT DETECTED Final   Staphylococcus aureus NOT DETECTED NOT DETECTED Final   Streptococcus species DETECTED (A) NOT DETECTED Final    Comment: Not Enterococcus species, Streptococcus agalactiae, Streptococcus pyogenes, or Streptococcus pneumoniae. CRITICAL RESULT CALLED TO, READ BACK BY AND VERIFIED WITH: N GLOGOVAC,PHARMD AT 0725 05/13/17 BY L BENFIELD    Streptococcus agalactiae NOT DETECTED NOT DETECTED Final   Streptococcus pneumoniae NOT DETECTED NOT DETECTED Final   Streptococcus pyogenes NOT DETECTED NOT DETECTED Final   Acinetobacter baumannii NOT DETECTED NOT DETECTED Final   Enterobacteriaceae species NOT DETECTED NOT DETECTED Final   Enterobacter cloacae complex NOT DETECTED NOT DETECTED Final   Escherichia coli NOT DETECTED NOT DETECTED Final   Klebsiella oxytoca NOT DETECTED NOT DETECTED Final   Klebsiella pneumoniae NOT DETECTED NOT DETECTED Final   Proteus species NOT DETECTED NOT DETECTED Final   Serratia marcescens NOT DETECTED NOT DETECTED Final   Haemophilus influenzae NOT DETECTED NOT DETECTED Final   Neisseria meningitidis NOT DETECTED NOT DETECTED Final   Pseudomonas aeruginosa NOT DETECTED NOT DETECTED Final   Candida albicans NOT DETECTED NOT DETECTED Final   Candida glabrata NOT DETECTED NOT DETECTED Final   Candida krusei NOT DETECTED NOT DETECTED Final   Candida parapsilosis NOT DETECTED NOT DETECTED Final   Candida tropicalis NOT DETECTED NOT DETECTED Final    Comment: Performed at [facility], [place]. [address], [place], [place] [address]  Culture, blood (Routine x 2)     Status: Abnormal   Collection Time: 05/12/17  3:55 PM  Result Value Ref Range Status  Specimen Description BLOOD LEFT HAND  Final   Special Requests   Final    BOTTLES DRAWN AEROBIC AND ANAEROBIC Blood Culture adequate volume   Culture  Setup Time   Final    GRAM POSITIVE COCCI IN PAIRS AND  CHAINS IN BOTH AEROBIC AND ANAEROBIC BOTTLES CRITICAL VALUE NOTED.  VALUE IS CONSISTENT WITH PREVIOUSLY REPORTED AND CALLED VALUE.    Culture (A)  Final    STREPTOCOCCUS GALLOLYTICUS ACINETOBACTER CALCOACETICUS/BAUMANNII COMPLEX CRITICAL RESULT CALLED TO, READ BACK BY AND VERIFIED WITH: J GADHIA,PHARMD AT 1010 05/14/17 BY L BENFIELD CONCERNING GROWTH ON CULTURE SUSCEPTIBILITIES PERFORMED ON PREVIOUS CULTURE WITHIN THE LAST 5 DAYS. ON STREPTOCOCCUS GALLOLYTICUS Performed at [facility], [place] [address] [place], [place] [address]    Report Status 05/15/2017 FINAL  Final   Organism ID, Bacteria ACINETOBACTER CALCOACETICUS/BAUMANNII COMPLEX  Final      Susceptibility   Acinetobacter calcoaceticus/baumannii complex - MIC*    CEFTAZIDIME 4 SENSITIVE Sensitive     CEFTRIAXONE 16 INTERMEDIATE Intermediate     CIPROFLOXACIN <=0.25 SENSITIVE Sensitive     GENTAMICIN <=1 SENSITIVE Sensitive     IMIPENEM <=0.25 SENSITIVE Sensitive     PIP/TAZO 32 INTERMEDIATE Intermediate     TRIMETH/SULFA 160 RESISTANT Resistant     CEFEPIME 4 SENSITIVE Sensitive     AMPICILLIN/SULBACTAM <=2 SENSITIVE Sensitive     * ACINETOBACTER CALCOACETICUS/BAUMANNII COMPLEX  Respiratory Panel by PCR     Status: None   Collection Time: 05/12/17  7:58 PM  Result Value Ref Range Status   Adenovirus NOT DETECTED NOT DETECTED Final   Coronavirus 229E NOT DETECTED NOT DETECTED Final   Coronavirus HKU1 NOT DETECTED NOT DETECTED Final   Coronavirus NL63 NOT DETECTED NOT DETECTED Final   Coronavirus OC43 NOT DETECTED NOT DETECTED Final   Metapneumovirus NOT DETECTED NOT DETECTED Final   Rhinovirus / Enterovirus NOT DETECTED NOT DETECTED Final   Influenza A NOT DETECTED NOT DETECTED Final   Influenza B NOT DETECTED NOT DETECTED Final   Parainfluenza Virus 1 NOT DETECTED NOT DETECTED Final   Parainfluenza Virus 2 NOT DETECTED NOT DETECTED Final   Parainfluenza Virus 3 NOT DETECTED NOT DETECTED Final   Parainfluenza Virus  4 NOT DETECTED NOT DETECTED Final   Respiratory Syncytial Virus NOT DETECTED NOT DETECTED Final   Bordetella pertussis NOT DETECTED NOT DETECTED Final   Chlamydophila pneumoniae NOT DETECTED NOT DETECTED Final   Mycoplasma pneumoniae NOT DETECTED NOT DETECTED Final    Comment: Performed at [facility], [place] [address], [place], [place] [address]  Gastrointestinal Panel by PCR , Stool     Status: Abnormal   Collection Time: 05/12/17  7:58 PM  Result Value Ref Range Status   Campylobacter species NOT DETECTED NOT DETECTED Final   Plesimonas shigelloides NOT DETECTED NOT DETECTED Final   Salmonella species NOT DETECTED NOT DETECTED Final   Yersinia enterocolitica DETECTED (A) NOT DETECTED Final    Comment: RESULT CALLED TO, READ BACK BY AND VERIFIED WITH:  Randa Lynn AT 8295 05/14/17 SDR    Vibrio species NOT DETECTED NOT DETECTED Final   Vibrio cholerae NOT DETECTED NOT DETECTED Final   Enteroaggregative E coli (EAEC) NOT DETECTED NOT DETECTED Final   Enteropathogenic E coli (EPEC) NOT DETECTED NOT DETECTED Final   Enterotoxigenic E coli (ETEC) NOT DETECTED NOT DETECTED Final   Shiga like toxin producing E coli (STEC) NOT DETECTED NOT DETECTED Final   Shigella/Enteroinvasive E coli (EIEC) NOT DETECTED NOT DETECTED Final  Cryptosporidium NOT DETECTED NOT DETECTED Final   Cyclospora cayetanensis NOT DETECTED NOT DETECTED Final   Entamoeba histolytica NOT DETECTED NOT DETECTED Final   Giardia lamblia NOT DETECTED NOT DETECTED Final   Adenovirus F40/41 NOT DETECTED NOT DETECTED Final   Astrovirus NOT DETECTED NOT DETECTED Final   Norovirus GI/GII NOT DETECTED NOT DETECTED Final   Rotavirus A NOT DETECTED NOT DETECTED Final   Sapovirus (I, II, IV, and V) NOT DETECTED NOT DETECTED Final  Culture, blood (routine x 2)     Status: None (Preliminary result)   Collection Time: 05/14/17 11:42 AM  Result Value Ref Range Status   Specimen Description BLOOD RIGHT ANTECUBITAL  Final    Special Requests   Final    BOTTLES DRAWN AEROBIC ONLY Blood Culture adequate volume   Culture   Final    NO GROWTH 4 DAYS Performed at [facility], [address], [place], [place] [address]    Report Status PENDING  Incomplete  Culture, blood (routine x 2)     Status: None (Preliminary result)   Collection Time: 05/14/17 11:42 AM  Result Value Ref Range Status   Specimen Description BLOOD BLOOD RIGHT FOREARM  Final   Special Requests IN PEDIATRIC BOTTLE Blood Culture adequate volume  Final   Culture   Final    NO GROWTH 4 DAYS Performed at [facility], [place] [address] [place], [place] [address]    Report Status PENDING  Incomplete  Culture, body fluid-bottle     Status: None (Preliminary result)   Collection Time: 05/15/17  4:07 PM  Result Value Ref Range Status   Specimen Description PERITONEAL  Final   Special Requests NONE  Final   Culture   Final    NO GROWTH 3 DAYS Performed at [place] [address] [place], [place] [address]    Report Status PENDING  Incomplete  Gram stain     Status: None   Collection Time: 05/15/17  4:07 PM  Result Value Ref Range Status   Specimen Description PERITONEAL  Final   Special Requests NONE  Final   Gram Stain   Final    WBC PRESENT, PREDOMINANTLY PMN NO ORGANISMS SEEN CYTOSPIN SMEAR Performed at [facility], [place] [address] [place],  [address]    Report Status 05/16/2017 FINAL  Final    Studies/Results: No results found.    Assessment/Plan:  INTERVAL HISTORY: pt continues to improve  Principal Problem:   Fever Active Problems:   Bacteremia   Liver cirrhosis secondary to NASH (HCC)   Hypokalemia   Anemia   Diabetes mellitus type 2, uncontrolled, with complications (HCC)   Thrombocytopenia (HCC)   Ascites   Hyperammonemia (HCC)   Diarrhea of presumed infectious origin   Infection due to acinetobacter baumannii   Streptococcus bovis infection   Acute bacterial endocarditis    SBP (spontaneous bacterial peritonitis) ([place])    Adam Butler is a 61 y.o. male with NASH, cirrhosis with admission with hepatic encephalopathy with blood cultures with Streptococcus gallyolyticus in 2/2 blood cultures and acinetobacter in 1/2 blood cultures now sp paracentesis.  #1 Streptococcus gallyolyticus (bovis) bacteremia:   Fortunately NO endocarditis on TEE.  Dr. Hilarie Fredrickson from GI has seen patient and doubts very much patient could have colon cancer based on colonoscopy one month ago  He believes this bacteremia stems from SBP  Interestingly we have seen this patient on multiple occasions. He previously had [facility] Streptococcal  bacteremia with clean TEE, Klebisella PNA bacteremia, GBS bacteremia prior to current episode  I would treat him with 2 weeks of unasyn from date of first negative blood cultures   #2 Acinetobacter in blood cultures: is S to IMIPENEM as well as ceftazidime, unasyn  Given that this is not a typical contaminant will go ahead and cover this as well with unasyn  #3 Yersinia enterobacter: not clear that this is a pathogen here but it would have been S to pip/tazo, CTX and IF patient goes on FQ prophylaxis it would also be S to that so I am not worried about our ability to treat this organism. It can produce beta lactamases but perhaps also S to unasyn. I would keep eye on the S bovis in the blood stream and Acinetbacter. The Yersinia will get taken care of if it has not already been taken care of   I feel comfortable with him DC today  Diagnosis: Strep bovis and Acinetobacter bacteremia without endocarditis  Culture Result: Strep bovis and Acintobacter  Allergies  Allergen Reactions  . Isosorbide Nitrate Other (See Comments)    Nose bleeds    OPAT Orders Discharge antibiotics: IV Unasyn  Duration: 2 weeks  End Date:  November 18th, 2018  [facility] Care Per Protocol:  Labs weekly while on IV antibiotics: _x_ CBC with differential _x_  BMP    _x_ Please pull PIC at completion of IV antibiotics __ Please leave PIC in place until doctor has seen patient or been notified  Fax weekly labs to [phone]  Clinic Follow Up Appt:  Next 3 weeks with myself or partner.              LOS: 6 days   Alcide Evener 05/18/2017, 11:12 AM

## 2017-05-18 NOTE — Progress Notes (Signed)
Patient ID: Adam Butler, male   DOB: April 22, 1956, 61 y.o.   MRN: [id number]      Progress Note   Subjective   Feeling well , no complaints- hopes to go home today  After PICC line placed  No c/o abdominal pain , discomfort , eating well, thinking well. Having loose stools   Objective   Vital signs in last 24 hours: Temp:  [97.7 F (36.5 C)-98 F (36.7 C)] 97.9 F (36.6 C) (11/08 0552) Pulse Rate:  [57-68] 57 (11/08 0552) Resp:  [16-23] 18 (11/08 0552) BP: (85-127)/(27-59) 105/41 (11/08 0552) SpO2:  [92 %-99 %] 96 % (11/08 0552) Weight:  [276 lb 0.3 oz (125.2 kg)] 276 lb 0.3 oz (125.2 kg) (11/08 0552) Last BM Date: 05/17/17 General: Older   white male  in NAD- in good spirits Heart:  Regular rate and rhythm; no murmurs Lungs: Respirations even and unlabored, lungs CTA bilaterally Abdomen:  Soft, nontender non-tense ascites. Normal bowel sounds. Extremities:  Without edema. Neurologic:  Alert and oriented,  grossly normal neurologically. No asterixis Psych:  Cooperative. Normal mood and affect.  Intake/Output from previous day: 11/07 0701 - 11/08 0700 In: 240 [P.O.:240] Out: -  Intake/Output this shift: No intake/output data recorded.  Lab Results: Recent Labs    05/17/17 0437 05/18/17 0457  WBC 2.5* 3.0*  HGB 9.4* 9.5*  HCT 27.6* 28.5*  PLT 49* 46*   BMET Recent Labs    05/17/17 0437 05/18/17 0457  NA 137 140  K 3.9 4.0  CL 109 113*  CO2 21* 23  GLUCOSE 126* 124*  BUN 9 9  CREATININE 0.67 0.72  CALCIUM 8.2* 8.3*   LFT No results for input(s): PROT, ALBUMIN, AST, ALT, ALKPHOS, BILITOT, BILIDIR, IBILI in the last 72 hours. PT/INR No results for input(s): LABPROT, INR in the last 72 hours.     Assessment / Plan:    #24 61 year old white male with decompensated Nash cirrhosis, complicated by significant portal hypertension, esophageal varices, chronic ascites and recurrent hepatic encephalopathy. Patient has had several hospitalizations  recently. Hospital I September 2018 with what was felt to be an infectious colitis, all cultures negative and was treated empirically with vancomycin. Readmitted a couple of weeks later with hepatic encephalopathy and fever consistent with sepsis. All cultures negative, no paracentesis was done as not felt to have enough ascites to tap. Readmitted again with weakness and confusion and fever -has been found to have a strep bovis bacteremia and Acinetobacter bacteremia, sensitive to Unasyn. Paracentesis was done after starting IV antibiotics and cell counts borderline but as patient had been on IV antibiotics felt consistent with SBP, and both organisms are got organisms. TEE negative Colonoscopy had been done in September 2018, so no concern for underlying colon malignancy can be associated with the strep bovis  #2 GI path panel positive for Yersinia this admission, patient has had ongoing mild diarrhea because he is chronically on lactulose, may have been a bit more than his usual. Yersinia is being covered with Unasyn as well #3 recurrent hepatic encephalopathy-precipitated by underlying infections, stable on lactulose and rifaximin has been restarted 550 twice a day. He was unable to get this approved for coverage previously we will try again #4 cholelithiasis #5 adult-onset diabetes mellitus #5 congestive heart failure with EF 50% #6 coronary artery disease  #7  4.1 cm thoracic aneurysm   Plan; okay for discharge from GI perspective He is receiving his second dose of albumin currently for SBP Plan is  for PICC line placement and 10 day course of IV Unasyn at home Will need to start Cipro 500 mg daily after he completes IV Unasyn for ongoing SBP prophylaxis Will leave off beta blocker for now Continue current doses of lactulose, and diuretics Carb Modified 2 g sodium diet  He is scheduled for follow-up office visit with GI/Meilyn Heindl PA-C on 06/05/2017 at 1:30 PM Will do follow-up labs  at that time as well.   Patient aware he should call our office for any problems in the interim.    Contact  Adam Butler, P.A.-C               [phone]      Principal Problem:   Fever Active Problems:   Bacteremia   Liver cirrhosis secondary to NASH (HCC)   Hypokalemia   Anemia   Diabetes mellitus type 2, uncontrolled, with complications (HCC)   Thrombocytopenia (HCC)   Ascites   Hyperammonemia (HCC)   Diarrhea of presumed infectious origin   Infection due to acinetobacter baumannii   Streptococcus bovis infection   Acute bacterial endocarditis   SBP (spontaneous bacterial peritonitis) ([place])     LOS: 6 days   [name]  05/18/2017, 9:53 AM

## 2017-05-18 NOTE — Care Management Note (Signed)
Case Management Note  Patient Details  Name: Adam Butler MRN: [id number] Date of Birth: 02/29/1956  Subjective/Objective:  Pt admitted with fever, Bacteremia.                  Action/Plan: Plan to discharge home with [place] for IV ABX.   Expected Discharge Date:  05/18/17               Expected Discharge Plan:  [place]  In-House Referral:     Discharge planning Services  CM Consult  Post Acute Care Choice:    Choice offered to:     DME Arranged:    DME Agency:     HH Arranged:  RN [place] Agency:  [place]  Status of Service:  In process, will continue to follow  If discussed at Long Length of Stay Meetings, dates discussed:    Additional CommentsPurcell Mouton, RN 05/18/2017, 2:37 PM

## 2017-05-19 ENCOUNTER — Telehealth: Payer: Self-pay | Admitting: *Deleted

## 2017-05-19 ENCOUNTER — Encounter (HOSPITAL_COMMUNITY): Payer: Self-pay | Admitting: Cardiovascular Disease

## 2017-05-19 ENCOUNTER — Telehealth: Payer: Self-pay | Admitting: Internal Medicine

## 2017-05-19 DIAGNOSIS — K7460 Unspecified cirrhosis of liver: Secondary | ICD-10-CM | POA: Diagnosis not present

## 2017-05-19 DIAGNOSIS — A409 Streptococcal sepsis, unspecified: Secondary | ICD-10-CM | POA: Diagnosis not present

## 2017-05-19 DIAGNOSIS — E1165 Type 2 diabetes mellitus with hyperglycemia: Secondary | ICD-10-CM | POA: Diagnosis not present

## 2017-05-19 DIAGNOSIS — A4159 Other Gram-negative sepsis: Secondary | ICD-10-CM | POA: Diagnosis not present

## 2017-05-19 DIAGNOSIS — D649 Anemia, unspecified: Secondary | ICD-10-CM | POA: Diagnosis not present

## 2017-05-19 DIAGNOSIS — K7581 Nonalcoholic steatohepatitis (NASH): Secondary | ICD-10-CM | POA: Diagnosis not present

## 2017-05-19 DIAGNOSIS — I330 Acute and subacute infective endocarditis: Secondary | ICD-10-CM | POA: Diagnosis not present

## 2017-05-19 LAB — CULTURE, BLOOD (ROUTINE X 2)
CULTURE: NO GROWTH
CULTURE: NO GROWTH
SPECIAL REQUESTS: ADEQUATE
SPECIAL REQUESTS: ADEQUATE

## 2017-05-19 NOTE — Telephone Encounter (Signed)
Adam Butler FVO:[id number] DOB: 11-28-1955 DOA: 05/12/2017  PCP: [name], NP  Admit date: 05/12/2017 Discharge date: 05/18/2017  Admitted From:home Disposition:home with home care  Recommendations for Outpatient Follow-up:  1. Follow up with PCP in 1-2 weeks 2. Please obtain BMP/CBC in one week  Home Health:yes Equipment/Devices:picc line for iv abx Discharge Condition:stable CODE STATUS:full code Diet recommendation:carb modified heart healthy diet   Transition Care Management Follow-up Telephone Call   How have you been since you were released from the hospital? Pretty good.   Do you understand why you were in the hospital? yes   Do you understand the discharge instructions? yes   Where were you discharged to? Home with home care for PICC line   Items Reviewed:  Medications reviewed: no, pt states his wife had some questions and spoke to someone earlier to get answers and that she is taking care of meds.   Allergies reviewed: yes  Dietary changes reviewed: yes  Referrals reviewed: yes   Functional Questionnaire:   Activities of Daily Living (ADLs):   He states they are independent in the following: ambulation, bathing and hygiene, feeding, continence, grooming, toileting and dressing States they require assistance with the following: n/a   Any transportation issues/concerns?: no   Any patient concerns? no   Confirmed importance and date/time of follow-up visits scheduled yes  Provider Appointment booked with [name], NP 05/29/17 @10   Confirmed with patient if condition begins to worsen call PCP or go to the ER.  Patient was given the office number and encouraged to call back with question or concerns.  : yes

## 2017-05-19 NOTE — Telephone Encounter (Signed)
Pt's wife called regarding the change in his meds since leaving the hospital. Adam Butler over his list of meds with the wife, and patient. Also made f/u hospital appointment with him.

## 2017-05-20 LAB — CULTURE, BODY FLUID-BOTTLE: CULTURE: NO GROWTH

## 2017-05-20 LAB — CULTURE, BODY FLUID W GRAM STAIN -BOTTLE

## 2017-05-22 ENCOUNTER — Telehealth: Payer: Self-pay

## 2017-05-22 ENCOUNTER — Other Ambulatory Visit: Payer: Self-pay

## 2017-05-22 DIAGNOSIS — E1165 Type 2 diabetes mellitus with hyperglycemia: Secondary | ICD-10-CM | POA: Diagnosis not present

## 2017-05-22 DIAGNOSIS — K7581 Nonalcoholic steatohepatitis (NASH): Secondary | ICD-10-CM | POA: Diagnosis not present

## 2017-05-22 DIAGNOSIS — Z7689 Persons encountering health services in other specified circumstances: Secondary | ICD-10-CM | POA: Diagnosis not present

## 2017-05-22 DIAGNOSIS — A4159 Other Gram-negative sepsis: Secondary | ICD-10-CM | POA: Diagnosis not present

## 2017-05-22 DIAGNOSIS — I330 Acute and subacute infective endocarditis: Secondary | ICD-10-CM | POA: Diagnosis not present

## 2017-05-22 DIAGNOSIS — K7460 Unspecified cirrhosis of liver: Secondary | ICD-10-CM | POA: Diagnosis not present

## 2017-05-22 DIAGNOSIS — D649 Anemia, unspecified: Secondary | ICD-10-CM | POA: Diagnosis not present

## 2017-05-22 DIAGNOSIS — A409 Streptococcal sepsis, unspecified: Secondary | ICD-10-CM | POA: Diagnosis not present

## 2017-05-22 MED ORDER — RIFAXIMIN 550 MG PO TABS: 550.0000 mg | ORAL_TABLET | Freq: Two times a day (BID) | ORAL | 6 refills | Status: DC

## 2017-05-22 MED ORDER — RIFAXIMIN 550 MG PO TABS: 550.0000 mg | ORAL_TABLET | Freq: Two times a day (BID) | ORAL | 0 refills | Status: DC

## 2017-05-22 NOTE — Telephone Encounter (Signed)
-----   Message from Alfredia Ferguson, PA-C sent at 05/18/2017  4:14 PM EST ----- Regarding: Lesly Dukes- pt has been hospitalized again . Please go thru approval process again for Xifaxan 550 mg po BID for hepatic encephalopathy- he needs it .Marland Kitchen Thanks  Pyrtle pt

## 2017-05-22 NOTE — Telephone Encounter (Signed)
Script sent to pharmacy.

## 2017-05-23 ENCOUNTER — Other Ambulatory Visit: Payer: Self-pay | Admitting: Pharmacist

## 2017-05-24 ENCOUNTER — Encounter: Payer: PPO | Admitting: Cardiothoracic Surgery

## 2017-05-25 ENCOUNTER — Ambulatory Visit: Payer: PPO | Admitting: Internal Medicine

## 2017-05-25 DIAGNOSIS — A409 Streptococcal sepsis, unspecified: Secondary | ICD-10-CM | POA: Diagnosis not present

## 2017-05-25 DIAGNOSIS — K652 Spontaneous bacterial peritonitis: Secondary | ICD-10-CM | POA: Diagnosis not present

## 2017-05-25 DIAGNOSIS — I5032 Chronic diastolic (congestive) heart failure: Secondary | ICD-10-CM | POA: Diagnosis not present

## 2017-05-25 DIAGNOSIS — D649 Anemia, unspecified: Secondary | ICD-10-CM | POA: Diagnosis not present

## 2017-05-25 DIAGNOSIS — Z794 Long term (current) use of insulin: Secondary | ICD-10-CM | POA: Diagnosis not present

## 2017-05-25 DIAGNOSIS — K7581 Nonalcoholic steatohepatitis (NASH): Secondary | ICD-10-CM | POA: Diagnosis not present

## 2017-05-25 DIAGNOSIS — I2510 Atherosclerotic heart disease of native coronary artery without angina pectoris: Secondary | ICD-10-CM | POA: Diagnosis not present

## 2017-05-25 DIAGNOSIS — R188 Other ascites: Secondary | ICD-10-CM | POA: Diagnosis not present

## 2017-05-25 DIAGNOSIS — K7460 Unspecified cirrhosis of liver: Secondary | ICD-10-CM | POA: Diagnosis not present

## 2017-05-25 DIAGNOSIS — Z452 Encounter for adjustment and management of vascular access device: Secondary | ICD-10-CM | POA: Diagnosis not present

## 2017-05-25 DIAGNOSIS — Z7982 Long term (current) use of aspirin: Secondary | ICD-10-CM | POA: Diagnosis not present

## 2017-05-25 DIAGNOSIS — Z5181 Encounter for therapeutic drug level monitoring: Secondary | ICD-10-CM | POA: Diagnosis not present

## 2017-05-25 DIAGNOSIS — I330 Acute and subacute infective endocarditis: Secondary | ICD-10-CM | POA: Diagnosis not present

## 2017-05-25 DIAGNOSIS — Z792 Long term (current) use of antibiotics: Secondary | ICD-10-CM | POA: Diagnosis not present

## 2017-05-25 DIAGNOSIS — A4159 Other Gram-negative sepsis: Secondary | ICD-10-CM | POA: Diagnosis not present

## 2017-05-25 DIAGNOSIS — F17210 Nicotine dependence, cigarettes, uncomplicated: Secondary | ICD-10-CM | POA: Diagnosis not present

## 2017-05-25 DIAGNOSIS — I110 Hypertensive heart disease with heart failure: Secondary | ICD-10-CM | POA: Diagnosis not present

## 2017-05-25 DIAGNOSIS — E1165 Type 2 diabetes mellitus with hyperglycemia: Secondary | ICD-10-CM | POA: Diagnosis not present

## 2017-05-26 ENCOUNTER — Encounter (HOSPITAL_COMMUNITY): Payer: Self-pay

## 2017-05-26 ENCOUNTER — Emergency Department (HOSPITAL_COMMUNITY)
Admission: EM | Admit: 2017-05-26 | Discharge: 2017-05-27 | Disposition: A | Discharge status: Home / Self Care | Payer: PPO | Attending: Emergency Medicine | Admitting: Emergency Medicine

## 2017-05-26 ENCOUNTER — Other Ambulatory Visit: Payer: Self-pay

## 2017-05-26 ENCOUNTER — Emergency Department (HOSPITAL_COMMUNITY): Payer: PPO

## 2017-05-26 DIAGNOSIS — T80218A Other infection due to central venous catheter, initial encounter: Secondary | ICD-10-CM | POA: Diagnosis not present

## 2017-05-26 DIAGNOSIS — Z79899 Other long term (current) drug therapy: Secondary | ICD-10-CM | POA: Insufficient documentation

## 2017-05-26 DIAGNOSIS — I5032 Chronic diastolic (congestive) heart failure: Secondary | ICD-10-CM | POA: Diagnosis not present

## 2017-05-26 DIAGNOSIS — E119 Type 2 diabetes mellitus without complications: Secondary | ICD-10-CM | POA: Diagnosis not present

## 2017-05-26 DIAGNOSIS — Y829 Unspecified medical devices associated with adverse incidents: Secondary | ICD-10-CM | POA: Insufficient documentation

## 2017-05-26 DIAGNOSIS — Z794 Long term (current) use of insulin: Secondary | ICD-10-CM | POA: Diagnosis not present

## 2017-05-26 DIAGNOSIS — Z792 Long term (current) use of antibiotics: Secondary | ICD-10-CM | POA: Diagnosis not present

## 2017-05-26 DIAGNOSIS — I2510 Atherosclerotic heart disease of native coronary artery without angina pectoris: Secondary | ICD-10-CM | POA: Diagnosis not present

## 2017-05-26 DIAGNOSIS — Z452 Encounter for adjustment and management of vascular access device: Secondary | ICD-10-CM | POA: Diagnosis not present

## 2017-05-26 DIAGNOSIS — Z7982 Long term (current) use of aspirin: Secondary | ICD-10-CM | POA: Insufficient documentation

## 2017-05-26 DIAGNOSIS — R079 Chest pain, unspecified: Secondary | ICD-10-CM | POA: Diagnosis not present

## 2017-05-26 DIAGNOSIS — T82838A Hemorrhage of vascular prosthetic devices, implants and grafts, initial encounter: Secondary | ICD-10-CM | POA: Insufficient documentation

## 2017-05-26 NOTE — ED Triage Notes (Signed)
Pt reports that when he was taking his medication in his PICC tonight, he noticed that it was coming out. PICC line is out in triage, but it appears that some is missing. MD made aware and X ray has been ordered. Pt denies chest pain, arm pain or SOB. A&Ox4.

## 2017-05-26 NOTE — Telephone Encounter (Signed)
Per [employer]	[place], patient has been approved for Xifaxan 550 mg from 05/24/17-11/21/17.

## 2017-05-27 ENCOUNTER — Emergency Department (HOSPITAL_COMMUNITY)
Admission: EM | Admit: 2017-05-27 | Discharge: 2017-05-27 | Disposition: A | Discharge status: Home / Self Care | Payer: PPO | Source: Home / Self Care | Attending: Emergency Medicine | Admitting: Emergency Medicine

## 2017-05-27 ENCOUNTER — Encounter (HOSPITAL_COMMUNITY): Payer: Self-pay | Admitting: Emergency Medicine

## 2017-05-27 ENCOUNTER — Other Ambulatory Visit: Payer: Self-pay

## 2017-05-27 ENCOUNTER — Emergency Department (HOSPITAL_COMMUNITY): Payer: PPO

## 2017-05-27 DIAGNOSIS — I5032 Chronic diastolic (congestive) heart failure: Secondary | ICD-10-CM

## 2017-05-27 DIAGNOSIS — F17210 Nicotine dependence, cigarettes, uncomplicated: Secondary | ICD-10-CM | POA: Insufficient documentation

## 2017-05-27 DIAGNOSIS — Z7982 Long term (current) use of aspirin: Secondary | ICD-10-CM

## 2017-05-27 DIAGNOSIS — Z794 Long term (current) use of insulin: Secondary | ICD-10-CM

## 2017-05-27 DIAGNOSIS — Z79899 Other long term (current) drug therapy: Secondary | ICD-10-CM | POA: Insufficient documentation

## 2017-05-27 DIAGNOSIS — Z87898 Personal history of other specified conditions: Secondary | ICD-10-CM | POA: Insufficient documentation

## 2017-05-27 DIAGNOSIS — Z792 Long term (current) use of antibiotics: Secondary | ICD-10-CM | POA: Insufficient documentation

## 2017-05-27 DIAGNOSIS — Z452 Encounter for adjustment and management of vascular access device: Secondary | ICD-10-CM

## 2017-05-27 DIAGNOSIS — I252 Old myocardial infarction: Secondary | ICD-10-CM | POA: Insufficient documentation

## 2017-05-27 DIAGNOSIS — I2510 Atherosclerotic heart disease of native coronary artery without angina pectoris: Secondary | ICD-10-CM | POA: Insufficient documentation

## 2017-05-27 DIAGNOSIS — E119 Type 2 diabetes mellitus without complications: Secondary | ICD-10-CM

## 2017-05-27 DIAGNOSIS — Z09 Encounter for follow-up examination after completed treatment for conditions other than malignant neoplasm: Secondary | ICD-10-CM

## 2017-05-27 MED ORDER — SODIUM CHLORIDE 0.9 % IV SOLN
3.0000 g | Freq: Once | INTRAVENOUS | Status: AC
  Administered 2017-05-27: 3 g via INTRAVENOUS
  Filled 2017-05-27: qty 3

## 2017-05-27 MED ORDER — LEVOFLOXACIN 500 MG PO TABS: 500.0000 mg | ORAL_TABLET | Freq: Every day | ORAL | 0 refills | Status: DC

## 2017-05-27 MED ORDER — LEVOFLOXACIN 500 MG PO TABS
500.0000 mg | ORAL_TABLET | Freq: Once | ORAL | Status: AC
  Administered 2017-05-27: 500 mg via ORAL
  Filled 2017-05-27: qty 1

## 2017-05-27 MED ORDER — DEXTROSE 5 % IV SOLN
2.0000 g | Freq: Once | INTRAVENOUS | Status: AC
  Administered 2017-05-27: 2 g via INTRAVENOUS
  Filled 2017-05-27: qty 2

## 2017-05-27 NOTE — Discharge Instructions (Signed)
You no longer need your PICC line because we gave you your last dose of IV antibiotics here today. You will need to continue on Levaquin oral antibiotic starting tomorrow since you got today's dose here, and use as directed until you follow up with the infectious disease specialists and the gastroenterologist. Make appointments with them for this coming week, for ongoing management of your condition. Return to the ER for emergent changes or worsening symptoms.

## 2017-05-27 NOTE — ED Notes (Signed)
Patient states that he was sent from [facility] to have picc replaced so he can continue with his daily antibiotics. No distress, alert and oriented, last dose of antibiotics yesterday

## 2017-05-27 NOTE — ED Notes (Signed)
IV Team states patient can check in at [facility] ED between 0730-0800 later this am to have PICC placed. Dr. Tyrone Nine made aware.

## 2017-05-27 NOTE — ED Provider Notes (Signed)
[facility] DEPT Provider Note   CSN: [id number] Arrival date & time: 05/26/17  2309     History   Chief Complaint Chief Complaint  Patient presents with  . Vascular Access Problem    HPI Adam Butler is a 61 y.o. male.  61 yo M with a chief complaint of bleeding from his PICC line site.  The patient has a PICC line for a suspected intra-abdominal infection, he is on Unasyn.  Was recently in the hospital for bacteremia and altered mental status.  His last day for antibiotics is Sunday.  He has been having some significant difficulty with infusing of medications through his PICC line.  Is taking much longer than expected.  He denies any continued fevers denies abdominal pain denies vomiting.  Is eating and drinking normally.  Denies cough or congestion.  Denies chest pain.  Denies pain at the PICC line site.   The history is provided by the patient.  Illness  This is a new problem. The current episode started 1 to 2 hours ago. The problem occurs constantly. The problem has been resolved. Pertinent negatives include no chest pain, no abdominal pain, no headaches and no shortness of breath. Nothing aggravates the symptoms. Nothing relieves the symptoms. He has tried nothing for the symptoms. The treatment provided no relief.    Past Medical History:  Diagnosis Date  . Arthritis   . Cholelithiasis   . Cirrhosis ([place]) 2011   Cryptogenic, Likely NASH. Family/pt deny EtOH. HCV, HBV, HAV negative. ANA negative. AMA positive. Ascites 12/11  . Coronary artery disease    Inferior MI 12/11; LHC with occluded mid CFX and 80% proximal RCA. EF 55%. He had 3.0 x 28 vision BMS to CFX  . Diastolic CHF, acute ([place])    Echo 12/11 with ef 50-55% and mild LVH. EF 55% by LV0gram in 12/11  . Esophageal varices ([place]) 2011, 2013   no hx acute variceal bleed  . Hepatic encephalopathy ([place]) 2011, 12/2013  . Hyperlipemia   . Myocardial infarction [facility]) ? 2012  . PFO  (patent foramen ovale): Per TEE 04/20/2015 04/20/2015  . Portal hypertension ([place])   . Rectal varices   . S/P coronary artery stent placement 06/2010  . SVT (supraventricular tachycardia) ([place])    1/12: appeared to be an ectopic atrial tachycardia. Required DCCV with hemodynamic instability  . Type II diabetes mellitus ([place]) 2011    Patient Active Problem List   Diagnosis Date Noted  . SBP (spontaneous bacterial peritonitis) ([place])   . Ascites   . Hyperammonemia ([place])   . Diarrhea of presumed infectious origin   . Infection due to acinetobacter baumannii   . Streptococcus bovis infection   . Acute bacterial endocarditis   . Fever 05/12/2017  . Hematuria   . Chronic diastolic CHF (congestive heart failure) ([place])   . Thoracic ascending aortic aneurysm ([place])   . Diabetes mellitus type 2, uncontrolled, with complications ([place])   . Alcoholic cirrhosis of liver without ascites ([place])   . NASH (nonalcoholic steatohepatitis)   . Somnolence   . Thrombocytopenia ([place])   . Sepsis ([place]) 04/27/2017  . Hematochezia 03/21/2017  . OA (osteoarthritis) of knee 07/19/2016  . Severe obesity (BMI >= 40) ([place]) 07/19/2016  . Portal vein thrombosis   . GERD (gastroesophageal reflux disease) 09/29/2015  . Type 2 diabetes mellitus ([place]) 08/18/2015  . Anemia 08/18/2015  . CAD in native artery   . Hypokalemia   . Liver cirrhosis secondary  to NASH ([place]) 06/06/2015  . Tobacco abuse 05/04/2015  . PFO (patent foramen ovale): Per TEE 04/20/2015 04/20/2015  . Bacteremia 04/16/2015  . Esophageal varices without bleeding ([place]) 08/16/2010  . HLD (hyperlipidemia) 08/02/2010  . DIASTOLIC HEART FAILURE, CHRONIC 08/02/2010    Past Surgical History:  Procedure Laterality Date  . APPENDECTOMY    . CARDIAC CATHETERIZATION  07/01/2010   BMS to CFX.  Marland Kitchen CATARACT EXTRACTION PHACO AND INTRAOCULAR LENS PLACEMENT ([place]) Left 04/28/2016   Performed by Leandrew Koyanagi, MD at [facility] ORS  . COLONOSCOPY N/A 04/13/2012     Performed by Inda Castle, MD at [facility]  . COLONOSCOPY WITH PROPOFOL N/A 03/22/2017   Performed by Doran Stabler, MD at [facility]  . CORONARY ANGIOPLASTY    . CORONARY STENT PLACEMENT  06/30/2010   CFX   Distal        . ESOPHAGEAL BANDING N/A 01/01/2014   Performed by Jerene Bears, MD at [facility] ENDOSCOPY  . ESOPHAGOGASTRODUODENOSCOPY (EGD) N/A 01/01/2014   Performed by Jerene Bears, MD at [facility] ENDOSCOPY  . ESOPHAGOGASTRODUODENOSCOPY (EGD) N/A 04/13/2012   Performed by Inda Castle, MD at [facility]  . ESOPHAGOGASTRODUODENOSCOPY (EGD) WITH PROPOFOL N/A 07/12/2016   Performed by Jerene Bears, MD at [facility]  . LEFT HEART CATHETERIZATION WITH CORONARY ANGIOGRAM N/A 10/15/2014   Performed by Martinique, Peter M, MD at [facility] CATH LAB  . orif r leg    . TRANSESOPHAGEAL ECHOCARDIOGRAM (TEE) N/A 05/17/2017   Performed by Josue Hector, MD at [facility]  . TRANSESOPHAGEAL ECHOCARDIOGRAM (TEE) N/A 04/20/2015   Performed by Fay Records, MD at [facility]    Prior to Admission medications   Medication Sig Start Date End Date Taking? Authorizing Provider  ampicillin-sulbactam (UNASYN) IVPB Inject 3 g every 6 (six) hours for 10 days into the vein. Indication: bacteremia   Last Day of Therapy:  05/28/17 Labs - Once weekly:  CBC/D and BMP 05/18/17 05/28/17  Rosita Fire, MD  Aspirin-Calcium Carbonate (BAYER WOMENS) [phone] MG TABS Take 81 mg by mouth daily.    [provider]  atorvastatin (LIPITOR) 10 MG tablet Take 10 mg by mouth daily.    [provider]  cyanocobalamin 1000 MCG tablet Take 1,000 mcg by mouth daily. Vitamin B12    [provider]  fluticasone (FLONASE) 50 MCG/ACT nasal spray Place 2 sprays into both nostrils 3 (three) times daily as needed for allergies or rhinitis (congestion).    [provider]  Insulin Glargine (LANTUS) 100 UNIT/ML Solostar Pen Inject 8 Units into the skin daily at 10 pm.  04/30/17   Allie Bossier, MD  Insulin Pen Needle (PEN NEEDLES) 31G X 8 MM MISC 8 Units by Does not apply route at bedtime. 04/30/17   Allie Bossier, MD  insulin regular (NOVOLIN R,HUMULIN R) 100 units/mL injection Inject 25-80 Units into the skin 3 (three) times daily as needed for high blood sugar (CBG >150).    [provider]  lactulose (CHRONULAC) 10 GM/15ML solution Take 30 mLs (20 g total) by mouth at bedtime. 04/30/17   Allie Bossier, MD  Multiple Vitamin (MULTIVITAMIN WITH MINERALS) TABS tablet Take 1 tablet by mouth daily.    [provider]  nitroGLYCERIN (NITROSTAT) 0.4 MG SL tablet Place 1 tablet (0.4 mg total) under the tongue every 5 (five) minutes as needed for chest pain (chest pain). 07/19/16  [name], NP  PARoxetine (PAXIL) 10 MG tablet Take 1 tablet (10 mg total) by mouth daily. 03/30/17   [name], NP  ranitidine (ZANTAC) 150 MG tablet Take 150 mg by mouth See admin instructions. Take 1 tablet (150 mg) by mouth every morning, may also take 1 tablet at night as needed for acid reflux    [provider]  rifaximin (XIFAXAN) 550 MG TABS tablet Take 1 tablet (550 mg total) 2 (two) times daily by mouth. 05/22/17   Pyrtle, Lajuan Lines, MD  rifaximin (XIFAXAN) 550 MG TABS tablet Take 1 tablet (550 mg total) 2 (two) times daily by mouth. 05/22/17   Pyrtle, Lajuan Lines, MD  spironolactone (ALDACTONE) 25 MG tablet Take 0.5 tablets (12.5 mg total) by mouth daily. 05/08/17   Jettie Booze, MD    Family History Family History  Problem Relation Age of Onset  . Heart attack Brother 26       MI  . Diabetes Brother   . Heart attack Father   . Diabetes Father   . COPD Father   . COPD Mother   . Heart attack Brother   . Stroke Neg Hx     Social History Social History   Tobacco Use  . Smoking status: Current Every Day Smoker    Packs/day: 0.25    Years: 36.00    Pack years: 9.00    Types: E-cigarettes, Cigarettes  . Smokeless tobacco: Never  Used  . Tobacco comment: uses vapor cigarettes (2016 ); I smoke about 4 cigarettes a day  Substance Use Topics  . Alcohol use: No    Alcohol/week: 0.0 oz  . Drug use: No     Allergies   Isosorbide nitrate   Review of Systems Review of Systems  Constitutional: Negative for chills and fever.  HENT: Negative for congestion and facial swelling.   Eyes: Negative for discharge and visual disturbance.  Respiratory: Negative for shortness of breath.   Cardiovascular: Negative for chest pain and palpitations.  Gastrointestinal: Negative for abdominal pain, diarrhea and vomiting.  Musculoskeletal: Negative for arthralgias and myalgias.       Bleeding from PICC line site  Skin: Negative for color change and rash.  Neurological: Negative for tremors, syncope and headaches.  Psychiatric/Behavioral: Negative for confusion and dysphoric mood.     Physical Exam Updated Vital Signs BP (!) 160/61   Pulse 71   Temp 98 F (36.7 C) (Oral)   Resp 18   SpO2 98%   Physical Exam  Constitutional: He is oriented to person, place, and time. He appears well-developed and well-nourished.  HENT:  Head: Normocephalic and atraumatic.  Eyes: EOM are normal. Pupils are equal, round, and reactive to light.  Neck: Normal range of motion. Neck supple. No JVD present.  Cardiovascular: Normal rate and regular rhythm. Exam reveals no gallop and no friction rub.  No murmur heard. Pulmonary/Chest: No respiratory distress. He has no wheezes.  Abdominal: He exhibits no distension and no mass. There is no tenderness. There is no rebound and no guarding.  Musculoskeletal: Normal range of motion.  PICC line in the right medial upper arm.  Trace bleeding around the site.  Controlled with pressure.  Neurological: He is alert and oriented to person, place, and time.  Skin: No rash noted. No pallor.  Psychiatric: He has a normal mood and affect. His behavior is normal.  Nursing note and vitals reviewed.    ED  Treatments / Results  Labs (all  labs ordered are listed, but only abnormal results are displayed) Labs Reviewed - No data to display  EKG  EKG Interpretation None       Radiology Dg Chest [facility] 1 View  Result Date: 05/27/2017 CLINICAL DATA:  60 year old male with concern for broken PICC. EXAM: PORTABLE CHEST 1 VIEW COMPARISON:  Chest radiograph dated 05/12/2017 FINDINGS: The lungs are clear. There is no pleural effusion or pneumothorax. The cardiac silhouette is within normal limits. No radiopaque foreign object identified to suggest a broken PICC. Old healed left clavicular fracture. No acute osseous pathology. IMPRESSION: 1. No acute cardiopulmonary process. 2. No radiopaque foreign object. Electronically Signed   By: Anner Crete M.D.   On: 05/27/2017 00:32    Procedures Procedures (including critical care time)  Medications Ordered in ED Medications  Ampicillin-Sulbactam (UNASYN) 3 g in sodium chloride 0.9 % 100 mL IVPB (0 g Intravenous Stopped 05/27/17 0250)     Initial Impression / Assessment and Plan / ED Course  I have reviewed the triage vital signs and the nursing notes.  Pertinent labs & imaging results that were available during my care of the patient were reviewed by me and considered in my medical decision making (see chart for details).     61 yo M with a chief complaint of difficulties with his PICC line.  Part of his line had been removed prior to his arrival.  He had a partial piece of what appeared to be the line.  Chest x-ray reviewed by me with no signs of foreign body.  I removed the rest of the PICC line which appears intact.  I suspect that this initial part that had been removed was the original guidewire.  We will give him a dose of Unasyn here.  Have him return to have a recurrent PICC placed.  4:56 AM:  I have discussed the diagnosis/risks/treatment options with the patient and family and believe the pt to be eligible for discharge home to follow-up  with PCP. We also discussed returning to the ED immediately if new or worsening sx occur. We discussed the sx which are most concerning (e.g., sudden worsening pain, fever, inability to tolerate by mouth) that necessitate immediate return. Medications administered to the patient during their visit and any new prescriptions provided to the patient are listed below.  Medications given during this visit Medications  Ampicillin-Sulbactam (UNASYN) 3 g in sodium chloride 0.9 % 100 mL IVPB (0 g Intravenous Stopped 05/27/17 0250)     The patient appears reasonably screen and/or stabilized for discharge and I doubt any other medical condition or other [facility] requiring further screening, evaluation, or treatment in the ED at this time prior to discharge.    Final Clinical Impressions(s) / ED Diagnoses   Final diagnoses:  Needs peripherally inserted central catheter (PICC)    ED Discharge Orders    None       Deno Etienne, DO 05/27/17 [phone]

## 2017-05-27 NOTE — ED Provider Notes (Signed)
[facility] EMERGENCY DEPARTMENT Provider Note   CSN: [id number] Arrival date & time: 05/27/17  0720     History   Chief Complaint Chief Complaint  Patient presents with  . Vascular Access Problem    HPI Adam Butler is a 61 y.o. male with a PMHx of cirrhosis, CAD, CHF, esophageal varices, HLD, DM2, and other conditions listed below, who presents to the ED to get his PICC line replaced. Chart review reveals that he was hospitalized for bacteremia on 11/2-8/18, was given a variety of abx during his hospitalization (first vanc/zosyn from 11/2-4/18, then rocephin 11/3-4/18, then primaxin 11/4-6/18, and finally unasyn 05/16/18 and on), and was sent home with unasyn 3g q6h x10 days from 05/18/17-05/28/17 for suspected SBP/intraabdominal infection. He had a PICC line placed before he left the hospital, however yesterday he went to [facility] because part of the PICC line broke; the remainder of the PICC line was removed and he was given his IV unasyn around 1:45am and told to come here in the morning in order to have the PICC line replaced. He has been giving his Unasyn to himself at home, and has been compliant with it. Reports no issues or complaints at this time, denies pain at the prior PICC line site, and denies any fevers, chills, CP, SOB, abd pain, N/V/D/C, hematuria, dysuria, myalgias, arthralgias, numbness, tingling, focal weakness, or any other complaints at this time.    The history is provided by the patient and medical records. No language interpreter was used.    Past Medical History:  Diagnosis Date  . Arthritis   . Cholelithiasis   . Cirrhosis ([place]) 2011   Cryptogenic, Likely NASH. Family/pt deny EtOH. HCV, HBV, HAV negative. ANA negative. AMA positive. Ascites 12/11  . Coronary artery disease    Inferior MI 12/11; LHC with occluded mid CFX and 80% proximal RCA. EF 55%. He had 3.0 x 28 vision BMS to CFX  . Diastolic CHF, acute ([place])    Echo 12/11 with ef  50-55% and mild LVH. EF 55% by LV0gram in 12/11  . Esophageal varices ([place]) 2011, 2013   no hx acute variceal bleed  . Hepatic encephalopathy ([place]) 2011, 12/2013  . Hyperlipemia   . Myocardial infarction [facility]) ? 2012  . PFO (patent foramen ovale): Per TEE 04/20/2015 04/20/2015  . Portal hypertension ([place])   . Rectal varices   . S/P coronary artery stent placement 06/2010  . SVT (supraventricular tachycardia) ([place])    1/12: appeared to be an ectopic atrial tachycardia. Required DCCV with hemodynamic instability  . Type II diabetes mellitus ([place]) 2011    Patient Active Problem List   Diagnosis Date Noted  . SBP (spontaneous bacterial peritonitis) ([place])   . Ascites   . Hyperammonemia ([place])   . Diarrhea of presumed infectious origin   . Infection due to acinetobacter baumannii   . Streptococcus bovis infection   . Acute bacterial endocarditis   . Fever 05/12/2017  . Hematuria   . Chronic diastolic CHF (congestive heart failure) ([place])   . Thoracic ascending aortic aneurysm ([place])   . Diabetes mellitus type 2, uncontrolled, with complications ([place])   . Alcoholic cirrhosis of liver without ascites ([place])   . NASH (nonalcoholic steatohepatitis)   . Somnolence   . Thrombocytopenia ([place])   . Sepsis ([place]) 04/27/2017  . Hematochezia 03/21/2017  . OA (osteoarthritis) of knee 07/19/2016  . Severe obesity (BMI >= 40) ([place]) 07/19/2016  . Portal vein thrombosis   .  GERD (gastroesophageal reflux disease) 09/29/2015  . Type 2 diabetes mellitus ([place]) 08/18/2015  . Anemia 08/18/2015  . CAD in native artery   . Hypokalemia   . Liver cirrhosis secondary to NASH ([place]) 06/06/2015  . Tobacco abuse 05/04/2015  . PFO (patent foramen ovale): Per TEE 04/20/2015 04/20/2015  . Bacteremia 04/16/2015  . Esophageal varices without bleeding ([place]) 08/16/2010  . HLD (hyperlipidemia) 08/02/2010  . DIASTOLIC HEART FAILURE, CHRONIC 08/02/2010    Past Surgical History:  Procedure Laterality Date  .  APPENDECTOMY    . CARDIAC CATHETERIZATION  07/01/2010   BMS to CFX.  Marland Kitchen CATARACT EXTRACTION PHACO AND INTRAOCULAR LENS PLACEMENT ([place]) Left 04/28/2016   Performed by Leandrew Koyanagi, MD at [facility] ORS  . COLONOSCOPY N/A 04/13/2012   Performed by Inda Castle, MD at [facility]  . COLONOSCOPY WITH PROPOFOL N/A 03/22/2017   Performed by Doran Stabler, MD at [facility]  . CORONARY ANGIOPLASTY    . CORONARY STENT PLACEMENT  06/30/2010   CFX   Distal        . ESOPHAGEAL BANDING N/A 01/01/2014   Performed by Jerene Bears, MD at [facility] ENDOSCOPY  . ESOPHAGOGASTRODUODENOSCOPY (EGD) N/A 01/01/2014   Performed by Jerene Bears, MD at [facility] ENDOSCOPY  . ESOPHAGOGASTRODUODENOSCOPY (EGD) N/A 04/13/2012   Performed by Inda Castle, MD at [facility]  . ESOPHAGOGASTRODUODENOSCOPY (EGD) WITH PROPOFOL N/A 07/12/2016   Performed by Jerene Bears, MD at [facility]  . LEFT HEART CATHETERIZATION WITH CORONARY ANGIOGRAM N/A 10/15/2014   Performed by Martinique, Peter M, MD at [facility] CATH LAB  . orif r leg    . TRANSESOPHAGEAL ECHOCARDIOGRAM (TEE) N/A 05/17/2017   Performed by Josue Hector, MD at [facility]  . TRANSESOPHAGEAL ECHOCARDIOGRAM (TEE) N/A 04/20/2015   Performed by Fay Records, MD at [facility] Medications    Prior to Admission medications   Medication Sig Start Date End Date Taking? Authorizing Provider  ampicillin-sulbactam (UNASYN) IVPB Inject 3 g every 6 (six) hours for 10 days into the vein. Indication: bacteremia   Last Day of Therapy:  05/28/17 Labs - Once weekly:  CBC/D and BMP 05/18/17 05/28/17  Rosita Fire, MD  Aspirin-Calcium Carbonate (BAYER WOMENS) [phone] MG TABS Take 81 mg by mouth daily.    [provider]  atorvastatin (LIPITOR) 10 MG tablet Take 10 mg by mouth daily.    [provider]  cyanocobalamin 1000 MCG tablet Take 1,000 mcg by mouth daily. Vitamin B12    [provider]  fluticasone (FLONASE) 50 MCG/ACT nasal  spray Place 2 sprays into both nostrils 3 (three) times daily as needed for allergies or rhinitis (congestion).    [provider]  Insulin Glargine (LANTUS) 100 UNIT/ML Solostar Pen Inject 8 Units into the skin daily at 10 pm. 04/30/17   Allie Bossier, MD  Insulin Pen Needle (PEN NEEDLES) 31G X 8 MM MISC 8 Units by Does not apply route at bedtime. 04/30/17   Allie Bossier, MD  insulin regular (NOVOLIN R,HUMULIN R) 100 units/mL injection Inject 25-80 Units into the skin 3 (three) times daily as needed for high blood sugar (CBG >150).    [provider]  lactulose (CHRONULAC) 10 GM/15ML solution Take 30 mLs (20 g total) by mouth at bedtime. 04/30/17   Allie Bossier, MD  Multiple Vitamin (MULTIVITAMIN WITH MINERALS) TABS tablet Take 1 tablet by mouth daily.  [provider]  nitroGLYCERIN (NITROSTAT) 0.4 MG SL tablet Place 1 tablet (0.4 mg total) under the tongue every 5 (five) minutes as needed for chest pain (chest pain). 07/19/16   [name], NP  PARoxetine (PAXIL) 10 MG tablet Take 1 tablet (10 mg total) by mouth daily. 03/30/17   [name], NP  ranitidine (ZANTAC) 150 MG tablet Take 150 mg by mouth See admin instructions. Take 1 tablet (150 mg) by mouth every morning, may also take 1 tablet at night as needed for acid reflux    [provider]  rifaximin (XIFAXAN) 550 MG TABS tablet Take 1 tablet (550 mg total) 2 (two) times daily by mouth. 05/22/17   Pyrtle, Lajuan Lines, MD  rifaximin (XIFAXAN) 550 MG TABS tablet Take 1 tablet (550 mg total) 2 (two) times daily by mouth. 05/22/17   Pyrtle, Lajuan Lines, MD  spironolactone (ALDACTONE) 25 MG tablet Take 0.5 tablets (12.5 mg total) by mouth daily. 05/08/17   Jettie Booze, MD    Family History Family History  Problem Relation Age of Onset  . Heart attack Brother 38       MI  . Diabetes Brother   . Heart attack Father   . Diabetes Father   . COPD Father   . COPD Mother   . Heart attack Brother     . Stroke Neg Hx     Social History Social History   Tobacco Use  . Smoking status: Current Every Day Smoker    Packs/day: 0.25    Years: 36.00    Pack years: 9.00    Types: E-cigarettes, Cigarettes  . Smokeless tobacco: Never Used  . Tobacco comment: uses vapor cigarettes (2016 ); I smoke about 4 cigarettes a day  Substance Use Topics  . Alcohol use: No    Alcohol/week: 0.0 oz  . Drug use: No     Allergies   Isosorbide nitrate   Review of Systems Review of Systems  Constitutional: Negative for chills and fever.  Respiratory: Negative for shortness of breath.   Cardiovascular: Negative for chest pain.  Gastrointestinal: Negative for abdominal pain, constipation, diarrhea, nausea and vomiting.  Genitourinary: Negative for dysuria and hematuria.  Musculoskeletal: Negative for arthralgias and myalgias.  Skin: Negative for color change.  Allergic/Immunologic: Positive for immunocompromised state (DM2).  Neurological: Negative for weakness and numbness.  Psychiatric/Behavioral: Negative for confusion.   All other systems reviewed and are negative for acute change except as noted in the HPI.    Physical Exam Updated Vital Signs BP (!) 148/49 (BP Location: Left Arm)   Pulse 78   Temp 98.8 F (37.1 C) (Oral)   Resp 20   Ht 5' 7"  (1.702 m)   Wt 125.2 kg (276 lb)   SpO2 97%   BMI 43.23 kg/m   Physical Exam  Constitutional: He is oriented to person, place, and time. Vital signs are normal. He appears well-developed and well-nourished.  Non-toxic appearance. No distress.  Afebrile, nontoxic, NAD  HENT:  Head: Normocephalic and atraumatic.  Mouth/Throat: Oropharynx is clear and moist and mucous membranes are normal.  Eyes: Conjunctivae and EOM are normal. Right eye exhibits no discharge. Left eye exhibits no discharge.  Neck: Normal range of motion. Neck supple.  Cardiovascular: Normal rate, regular rhythm, normal heart sounds and intact distal pulses. Exam reveals no  gallop and no friction rub.  No murmur heard. Pulmonary/Chest: Effort normal and breath sounds normal. No respiratory distress. He has no decreased breath  sounds. He has no wheezes. He has no rhonchi. He has no rales.  Abdominal: Soft. Normal appearance and bowel sounds are normal. He exhibits no distension. There is no tenderness. There is no rigidity, no rebound, no guarding, no CVA tenderness, no tenderness at McBurney's point and negative Murphy's sign.  Musculoskeletal: Normal range of motion.  Neurological: He is alert and oriented to person, place, and time. He has normal strength. No sensory deficit.  Skin: Skin is warm, dry and intact. Ecchymosis noted. No rash noted.  Small scattered bruises to R upper arm PICC line site, but free of erythema or warmth, no area of tenderness, no swelling. Larger bruise noted to L upper arm.   Psychiatric: He has a normal mood and affect.  Nursing note and vitals reviewed.    ED Treatments / Results  Labs (all labs ordered are listed, but only abnormal results are displayed) Labs Reviewed - No data to display  EKG  EKG Interpretation None       Radiology Dg Chest Port 1 View  Result Date: 05/27/2017 CLINICAL DATA:  61 year old male with concern for broken PICC. EXAM: PORTABLE CHEST 1 VIEW COMPARISON:  Chest radiograph dated 05/12/2017 FINDINGS: The lungs are clear. There is no pleural effusion or pneumothorax. The cardiac silhouette is within normal limits. No radiopaque foreign object identified to suggest a broken PICC. Old healed left clavicular fracture. No acute osseous pathology. IMPRESSION: 1. No acute cardiopulmonary process. 2. No radiopaque foreign object. Electronically Signed   By: Anner Crete M.D.   On: 05/27/2017 00:32    Procedures Procedures (including critical care time)  Medications Ordered in ED Medications  cefTRIAXone (ROCEPHIN) 2 g in dextrose 5 % 50 mL IVPB (0 g Intravenous Stopped 05/27/17 1344)    levofloxacin (LEVAQUIN) tablet 500 mg (500 mg Oral Given 05/27/17 1300)     Initial Impression / Assessment and Plan / ED Course  I have reviewed the triage vital signs and the nursing notes.  Pertinent labs & imaging results that were available during my care of the patient were reviewed by me and considered in my medical decision making (see chart for details).     61 y.o. male here to get PICC line replaced; was recently admitted 11/2-8/18 for bacteremia presumed to be from SBP; was discharged with unasyn q6h x10 days (11/8-18/18) however yesterday his PICC line broke so it was removed, he was given his dose of unasyn in the ER around 1:45am, and then he was told to come back this morning to get it replaced. During his admission he was on vanc/zosyn from 11/2-4/18, then rocephin 11/3-4/18, then primaxin 11/4-6/18, and unasyn 05/16/17 and on. He feels well and has no complaints. Prior PICC site free of erythema or warmth. Consulted PICC team, however after discussion with Dr. Ashok Cordia my attending, we decided that perhaps reaching out to ID would be better to see if we needed to place PICC line today for 2 days of therapy or if he could be considered treated at this point. Will consult ID and await recommendations.   11:38 AM Dr. Tommy Medal of ID returning page, states we could just give him 2g rocephin now and then not do the PICC line since this would get him 14 days of abx for his bacteremia treatment, and send him home with levaquin 517m daily for SBP prophylaxis and have him f/up with GI and ID this coming week for ongoing management. Will do that now.   1:55 PM Abx  finished however we're still waiting on case manager to see about his home levaquin rx because pt states he can't afford his medicine. Will continue to await further instruction from case management.   2:11 PM Case manager states we can't help him much more than just getting the GoodRx coupon for him, since he has insurance. Pt  states he'll do the best he can with this, since the coupon brings it down to $15.97 for the 30 day supply. Advised taking abx as directed until he follows up with his ID and GI specialists. F/up with them this week. I explained the diagnosis and have given explicit precautions to return to the ER including for any other new or worsening symptoms. The patient understands and accepts the medical plan as it's been dictated and I have answered their questions. Discharge instructions concerning home care and prescriptions have been given. The patient is STABLE and is discharged to home in good condition.   Final Clinical Impressions(s) / ED Diagnoses   Final diagnoses:  Follow-up treatment  Encounter for long-term (current) use of antibiotics  History of bacteremia    ED Discharge Orders        Ordered    levofloxacin (LEVAQUIN) 500 MG tablet  Daily     05/27/17 [address]  [place], [place], [place] 05/27/17 1414    Lajean Saver, MD 05/28/17 1407

## 2017-05-27 NOTE — ED Triage Notes (Signed)
Pt. Stated, I was suppose to be here at 730 per  [facility] cause pick line broke . In rt. Upper arm

## 2017-05-27 NOTE — Discharge Instructions (Signed)
Go to the [facility] emergency department in the morning between 730 and 8:00 to have your PICC line placed.

## 2017-05-27 NOTE — Care Management Note (Signed)
Case Management Note  Patient Details  Name: LAYTON NAVES MRN: [id number] Date of Birth: 07-07-1956  Subjective/Objective:  CM received call for this 61 y.o. M requesting assistance with his po antibiotics (30days). Unable to use Z fund as pt needs /is requesting 30 days meds. CM explained the importance of his compliance and possible consequences if he failed to comply. Agreed that he would use GoodRx for 30days and talk with PCP about samples on Monday. EDV has GoodRx to issue with Rx                  Action/Plan:CM will sign off for now but will be available should additional discharge needs arise or disposition change.    Expected Discharge Date:                  Expected Discharge Plan:  Home/Self Care  In-House Referral:     Discharge planning Services  CM Consult, Medication Assistance  Post Acute Care Choice:    Choice offered to:  Patient  DME Arranged:    DME Agency:     HH Arranged:    [place] Agency:     Status of Service:  Completed, signed off  If discussed at [employer] of Stay Meetings, dates discussed:    Additional Comments:  Delrae Sawyers, RN 05/27/2017, 2:15 PM

## 2017-05-27 NOTE — ED Notes (Signed)
Case management at bedside.

## 2017-05-27 NOTE — ED Notes (Signed)
Pt given diet ginger ale and graham crackers per Angela Nevin Investment banker, corporate)

## 2017-05-29 ENCOUNTER — Encounter: Payer: Self-pay | Admitting: Internal Medicine

## 2017-05-29 ENCOUNTER — Ambulatory Visit: Payer: PPO | Admitting: Internal Medicine

## 2017-05-29 VITALS — BP 118/72 | HR 77 | Temp 97.7°F | Wt 270.0 lb

## 2017-05-29 DIAGNOSIS — K7581 Nonalcoholic steatohepatitis (NASH): Secondary | ICD-10-CM | POA: Diagnosis not present

## 2017-05-29 DIAGNOSIS — T82898D Other specified complication of vascular prosthetic devices, implants and grafts, subsequent encounter: Secondary | ICD-10-CM

## 2017-05-29 DIAGNOSIS — A419 Sepsis, unspecified organism: Secondary | ICD-10-CM

## 2017-05-29 DIAGNOSIS — A409 Streptococcal sepsis, unspecified: Secondary | ICD-10-CM | POA: Diagnosis not present

## 2017-05-29 DIAGNOSIS — A4159 Other Gram-negative sepsis: Secondary | ICD-10-CM | POA: Diagnosis not present

## 2017-05-29 DIAGNOSIS — Z794 Long term (current) use of insulin: Secondary | ICD-10-CM | POA: Diagnosis not present

## 2017-05-29 DIAGNOSIS — E119 Type 2 diabetes mellitus without complications: Secondary | ICD-10-CM | POA: Diagnosis not present

## 2017-05-29 DIAGNOSIS — K7460 Unspecified cirrhosis of liver: Secondary | ICD-10-CM | POA: Diagnosis not present

## 2017-05-29 DIAGNOSIS — I330 Acute and subacute infective endocarditis: Secondary | ICD-10-CM | POA: Diagnosis not present

## 2017-05-29 DIAGNOSIS — K7290 Hepatic failure, unspecified without coma: Secondary | ICD-10-CM | POA: Diagnosis not present

## 2017-05-29 DIAGNOSIS — K7682 Hepatic encephalopathy: Secondary | ICD-10-CM

## 2017-05-29 DIAGNOSIS — D649 Anemia, unspecified: Secondary | ICD-10-CM | POA: Diagnosis not present

## 2017-05-29 DIAGNOSIS — E1165 Type 2 diabetes mellitus with hyperglycemia: Secondary | ICD-10-CM | POA: Diagnosis not present

## 2017-05-29 LAB — MICROALBUMIN / CREATININE URINE RATIO
Creatinine,U: 167.8 mg/dL
MICROALB UR: 1 mg/dL (ref 0.0–1.9)
Microalb Creat Ratio: 0.6 mg/g (ref 0.0–30.0)

## 2017-05-29 LAB — HEMOGLOBIN A1C: HEMOGLOBIN A1C: 6.9 % — AB (ref 4.6–6.5)

## 2017-05-29 NOTE — Patient Instructions (Signed)
Sepsis, Adult Sepsis is a serious bodily reaction to an infection. The infection that causes sepsis may be from a bacteria, a virus, a fungus, or a parasite. Sepsis can result from an infection in any part of the body. Infections that commonly lead to sepsis include skin, lung, and urinary tract infections. Sepsis is a medical emergency that requires immediate treatment at the hospital. In severe cases, it can lead to septic shock. Shock can weaken the heart and cause blood pressure to drop. This can make the central nervous system and the body's organs to stop working. What are the causes? This condition is caused by a severe reaction to a bacterial, viral, fungal, or parasitic infection. The germs that most commonly lead to sepsis include:  Escherichia coli (E. coli).  Staphylococcus aureus (staph).  The most common infections that lead to sepsis include infections of:  The skin.  The lung (pneumonia).  The gut.  The kidneys (urinary tract infection).  What increases the risk? You are more likely to develop this condition if:  You have a weakened disease-fighting (immune) system.  You are 12 or older.  You are male.  You had surgery, or you have been hospitalized.  You have a catheter, breathing tube, or drainage tubes inserted into your body.  You are not getting enough nutrients from food (are malnourished).  You have other long-term (chronic) diseases, including: ? Cancer. ? AIDS. ? Liver disease. ? Lung disease. ? Diabetes.  You have severe burns or injuries.  You inject drugs.  You have heart valve problems.  What are the signs or symptoms? Symptoms of this condition may include:  Fever.  Chills or feeling very cold.  Fast heart rate (tachycardia).  Rapid breathing (hyperventilation).  Shortness of breath.  Confusion or light-headedness.  Changes in skin color. Your skin may look blotchy, pale, or blue.  Cool, clammy skin or sweaty skin.  Skin  rash.  Nausea and vomiting.  Urinating much less than usual.  How is this diagnosed? This condition is diagnosed based on:  Your symptoms.  Your medical history.  A physical exam.  Other tests may also be done to find out the cause of the infection and how severe the sepsis is. These tests may include:  Blood tests.  Urine tests.  Swabs from other areas of the body that may have an infection. These samples may be tested (cultured) to find out what type of bacteria is causing the infection.  Chest X-ray to check for pneumonia. Other imaging tests, such as a CT scan, may also be done.  Lumbar puncture. This is a procedure to remove a small amount of the fluid that surrounds the brain and spinal cord. The fluid is then examined for infection.  How is this treated? This condition is treated in a hospital with antibiotic medicines. You may also receive:  Fluids through an IV tube.  Oxygen and breathing assistance.  Kidney dialysis. This process cleans the blood if the kidneys have failed.  Surgery to remove infected tissue.  Medicines to increase your blood pressure.  Nutrients to correct imbalances in basic body function (metabolism). This may involve receiving important salts and minerals (electrolytes) through an IV and having your blood sugar level adjusted.  Steroid medicines to control your body's reaction to the infection.  Follow these instructions at home: Medicines  Take over-the-counter and prescription medicines only as told by your health care provider.  If you were prescribed an antibiotic or anti-fungal medicine, take it  as told by your health care provider. Do not stop taking the antibiotic or anti-fungal medicine even if you start to feel better. Activity  Rest and gradually return to your normal activities. Ask your health care provider what activities are safe for you.  Try to set small, achievable goals each week, such as dressing yourself,  bathing, or walking up stairs. It may take a while to rebuild your strength.  Try to exercise regularly, if you feel healthy enough to do so. Ask your health care provider what exercises are safe for you. General instructions  Drink enough fluid to keep your urine clear or pale yellow.  Eat a healthy, balanced diet. This includes plenty of fruits and vegetables, whole grains, and lowfat (lean) proteins. Ask your health care provider if you should avoid certain foods.  Keep all follow-up visits as told by your health care provider. This is important. Contact a health care provider if:  You do not feel like you are getting better or regaining strength.  You are having trouble coping with your recovery.  You frequently feel tired.  You feel worse or do not seem to get better after surgery.  You think you may have an infection after surgery. Get help right away if:  You have any symptoms of sepsis.  You have difficulty breathing.  You have a rapid or skipping heartbeat.  You become confused.  You have a high fever.  Your skin becomes blotchy, pale, or blue. These symptoms may represent a serious problem that is an emergency. Do not wait to see if the symptoms will go away. Get medical help right away. Call your local emergency services (911 in the [place] Summary  Sepsis is a medical emergency that requires immediate treatment at the hospital.  This condition is caused by a severe reaction to a bacterial, viral, fungal, or parasitic infection.  This condition is treated in a hospital with antibiotics. Treatment may also include IV fluids, breathing assistance, and kidney dialysis.  If you were prescribed an antibiotic or anti-fungal medicine, take it as told by your health care provider. Do not stop taking the antibiotic or anti-fungal medicine even if you start to feel better. This information is not intended to replace advice given to you by your health care provider. Make  sure you discuss any questions you have with your health care provider. Document Released: 03/26/2003 Document Revised: 05/31/2016 Document Reviewed: 05/31/2016 Elsevier Interactive Patient Education  2017 [employer].

## 2017-05-29 NOTE — Progress Notes (Signed)
Subjective:    Patient ID: Adam Butler, male    DOB: May 24, 1956, 61 y.o.   MRN: [id number]  HPI  Pt presents to the clinic today for Hospital follow up and ER follow up. He went to the ER on 11/2 with c/o fever, cough and AMS. He was admitted for sepsis and encephalopathy. He was found to have a streptococcal/acinetobactor bacteremia, presumably from a GI source. His TEE showed no vegetation. His stool culture was also positive for [place]. A PICC line was placed and he was placed on 2 weeks of IV Unasyn. He was advised to follow up with GI and ID.   On 11/16, he went back to the ER with c/o difficulty infusing through his PICC line and bleeding around the site. He had also reported that part of his PICC line broke. The PICC line was removed and he was given IV Unasyn in the ED, and advised to return tomorrow to have the PICC line replaced.  11/17, he went back to the ER for PICC line placement. ID was consulted. They opted not to replace the PICC. They gave him 2 gm Rocephin and discharged him on Levaquin. They advised him to follow up with GI and ID. He reports he is schedules with GI on 11/26 and ID on 12/4.  He is also due to have his A1C done today. His last A1C was 9.5%. He is taking Lantus as prescribed. He reports his fasting sugars range 60-140. He uses the Novolin a few times per week if preprandial sugars are elevated. His last eye exam was 1 year ago. He does not check his feet. His flu shot and pneumovax are UTD.  Review of Systems      Past Medical History:  Diagnosis Date  . Arthritis   . Cholelithiasis   . Cirrhosis ([place]) 2011   Cryptogenic, Likely NASH. Family/pt deny EtOH. HCV, HBV, HAV negative. ANA negative. AMA positive. Ascites 12/11  . Coronary artery disease    Inferior MI 12/11; LHC with occluded mid CFX and 80% proximal RCA. EF 55%. He had 3.0 x 28 vision BMS to CFX  . Diastolic CHF, acute ([place])    Echo 12/11 with ef 50-55% and mild LVH. EF 55% by  LV0gram in 12/11  . Esophageal varices ([place]) 2011, 2013   no hx acute variceal bleed  . Hepatic encephalopathy ([place]) 2011, 12/2013  . Hyperlipemia   . Myocardial infarction [facility]) ? 2012  . PFO (patent foramen ovale): Per TEE 04/20/2015 04/20/2015  . Portal hypertension ([place])   . Rectal varices   . S/P coronary artery stent placement 06/2010  . SVT (supraventricular tachycardia) ([place])    1/12: appeared to be an ectopic atrial tachycardia. Required DCCV with hemodynamic instability  . Type II diabetes mellitus ([place]) 2011    Current Outpatient Medications  Medication Sig Dispense Refill  . Aspirin-Calcium Carbonate (BAYER WOMENS) 81-777 MG TABS Take 81 mg by mouth daily.    Marland Kitchen atorvastatin (LIPITOR) 10 MG tablet Take 10 mg by mouth daily.    . cyanocobalamin 1000 MCG tablet Take 1,000 mcg by mouth daily. Vitamin B12    . fluticasone (FLONASE) 50 MCG/ACT nasal spray Place 2 sprays into both nostrils 3 (three) times daily as needed for allergies or rhinitis (congestion).    . Insulin Glargine (LANTUS) 100 UNIT/ML Solostar Pen Inject 8 Units into the skin daily at 10 pm. 15 mL 0  . Insulin Pen Needle (PEN NEEDLES) 31G X 8 MM  MISC 8 Units by Does not apply route at bedtime. 100 each 0  . insulin regular (NOVOLIN R,HUMULIN R) 100 units/mL injection Inject 2-4 Units 3 (three) times daily as needed into the skin for high blood sugar (CBG >150).     . lactulose (CHRONULAC) 10 GM/15ML solution Take 30 mLs (20 g total) by mouth at bedtime. 240 mL 0  . levofloxacin (LEVAQUIN) 500 MG tablet Take 1 tablet (500 mg total) daily by mouth. STARTING 05/28/17 30 tablet 0  . Multiple Vitamin (MULTIVITAMIN WITH MINERALS) TABS tablet Take 1 tablet by mouth daily.    . nitroGLYCERIN (NITROSTAT) 0.4 MG SL tablet Place 1 tablet (0.4 mg total) under the tongue every 5 (five) minutes as needed for chest pain (chest pain). 25 tablet 5  . PARoxetine (PAXIL) 10 MG tablet Take 1 tablet (10 mg total) by mouth daily. 30  tablet 5  . ranitidine (ZANTAC) 150 MG tablet Take 150 mg by mouth See admin instructions. Take 1 tablet (150 mg) by mouth every morning, may also take 1 tablet at night as needed for acid reflux    . rifaximin (XIFAXAN) 550 MG TABS tablet Take 1 tablet (550 mg total) 2 (two) times daily by mouth. 20 tablet 0  . rifaximin (XIFAXAN) 550 MG TABS tablet Take 1 tablet (550 mg total) 2 (two) times daily by mouth. 60 tablet 6  . spironolactone (ALDACTONE) 25 MG tablet Take 0.5 tablets (12.5 mg total) by mouth daily. 45 tablet 3   No current facility-administered medications for this visit.     Allergies  Allergen Reactions  . Isosorbide Nitrate Other (See Comments)    Nose bleeds    Family History  Problem Relation Age of Onset  . Heart attack Brother 42       MI  . Diabetes Brother   . Heart attack Father   . Diabetes Father   . COPD Father   . COPD Mother   . Heart attack Brother   . Stroke Neg Hx     Social History   Socioeconomic History  . Marital status: Married    Spouse name: Not on file  . Number of children: 3  . Years of education: Not on file  . Highest education level: Not on file  Social Needs  . Financial resource strain: Not on file  . Food insecurity - worry: Not on file  . Food insecurity - inability: Not on file  . Transportation needs - medical: Not on file  . Transportation needs - non-medical: Not on file  Occupational History  . 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 Smoking status: Current Every Day Smoker    Packs/day: 0.25    Years: 36.00    Pack years: 9.00    Types: E-cigarettes, Cigarettes  . Smokeless tobacco: Never Used  . Tobacco comment: uses vapor cigarettes (2016 ); I smoke about 4 cigarettes a day  Substance and Sexual Activity  . Alcohol use: No    Alcohol/week: 0.0 oz  . Drug use: No  . Sexual activity: No  Other Topics Concern  . Not on file  Social History  Narrative   Married   Gets regular exercise: walking     Constitutional: Denies fever, malaise, fatigue, headache or abrupt weight changes.  Respiratory: Denies difficulty breathing, shortness of breath, cough or sputum production.   Cardiovascular: Denies chest pain, chest tightness, palpitations or swelling in  the hands or feet.  Gastrointestinal: Pt reports loose stools (on Lactulose) Denies abdominal pain, bloating, constipation, or blood in the stool.  Skin: Denies redness, rashes, lesions or ulcercations.  Neurological: Denies dizziness, difficulty with memory, difficulty with speech or problems with balance and coordination.    No other specific complaints in a complete review of systems (except as listed in HPI above).  Objective:   Physical Exam  BP 118/72   Pulse 77   Temp 97.7 F (36.5 C) (Oral)   Wt 270 lb (122.5 kg)   SpO2 96%   BMI 42.29 kg/m  Wt Readings from Last 3 Encounters:  05/29/17 270 lb (122.5 kg)  05/27/17 276 lb (125.2 kg)  05/18/17 276 lb 0.3 oz (125.2 kg)    General: Appears his stated age, obese in NAD. Skin: Warm, dry and intact. No ulcerations noted. Site of PICC line scabbed, intact. No s/s of infection. Pulmonary/Chest: Normal effort and positive vesicular breath sounds. No respiratory distress. No wheezes, rales or ronchi noted.  Abdomen: Soft and nontender. Normal bowel sounds. No distention or masses noted.  Neurological: Alert and oriented. Sensation intact to BLE.   BMET    Component Value Date/Time   NA 140 05/18/2017 0457   K 4.0 05/18/2017 0457   CL 113 (H) 05/18/2017 0457   CO2 23 05/18/2017 0457   GLUCOSE 124 (H) 05/18/2017 0457   BUN 9 05/18/2017 0457   CREATININE 0.72 05/18/2017 0457   CREATININE 0.74 03/15/2011 0827   CALCIUM 8.3 (L) 05/18/2017 0457   GFRNONAA >60 05/18/2017 0457   GFRAA >60 05/18/2017 0457    Lipid Panel     Component Value Date/Time   CHOL 133 11/17/2016 0854   TRIG 133.0 11/17/2016 0854   HDL  39.90 11/17/2016 0854   CHOLHDL 3 11/17/2016 0854   VLDL 26.6 11/17/2016 0854   LDLCALC 67 11/17/2016 0854    CBC    Component Value Date/Time   WBC 3.0 (L) 05/18/2017 0457   RBC 2.82 (L) 05/18/2017 0457   HGB 9.5 (L) 05/18/2017 0457   HCT 28.5 (L) 05/18/2017 0457   PLT 46 (L) 05/18/2017 0457   MCV 101.1 (H) 05/18/2017 0457   MCH 33.7 05/18/2017 0457   MCHC 33.3 05/18/2017 0457   RDW 15.4 05/18/2017 0457   LYMPHSABS 0.8 05/12/2017 1502   MONOABS 1.1 (H) 05/12/2017 1502   EOSABS 0.0 05/12/2017 1502   BASOSABS 0.0 05/12/2017 1502    Hgb A1C Lab Results  Component Value Date   HGBA1C 9.5 (H) 02/24/2017            Assessment & Plan:   Hospital Follow Up for Sepsis, Hepatic Encephalopathy and PICC Line Problem:  Hospital notes, labs and imaging reviewed He will continue Levaquin for now He will follow up with GI and ID as scheduled Continue current medications at this time  DM 2:  A1C and microalbumin today Encouraged him to consume a low carb diet and exercise for weight loss Encouraged yearly eye exam Foot exam today Continue Lantus and Novolin as scheduled Flu and pneumovax UTD  RTC in 6 months for your Medicare Wellness Exam [name], NP

## 2017-06-05 ENCOUNTER — Encounter: Payer: Self-pay | Admitting: Physician Assistant

## 2017-06-05 ENCOUNTER — Other Ambulatory Visit (INDEPENDENT_AMBULATORY_CARE_PROVIDER_SITE_OTHER): Payer: PPO

## 2017-06-05 ENCOUNTER — Ambulatory Visit (INDEPENDENT_AMBULATORY_CARE_PROVIDER_SITE_OTHER): Payer: PPO | Admitting: Physician Assistant

## 2017-06-05 VITALS — BP 120/68 | HR 66 | Ht 67.0 in | Wt 268.4 lb

## 2017-06-05 DIAGNOSIS — K652 Spontaneous bacterial peritonitis: Secondary | ICD-10-CM

## 2017-06-05 DIAGNOSIS — K7290 Hepatic failure, unspecified without coma: Secondary | ICD-10-CM

## 2017-06-05 LAB — CBC WITH DIFFERENTIAL/PLATELET
BASOS ABS: 0 10*3/uL (ref 0.0–0.1)
Basophils Relative: 1.1 % (ref 0.0–3.0)
EOS PCT: 4.6 % (ref 0.0–5.0)
Eosinophils Absolute: 0.1 10*3/uL (ref 0.0–0.7)
HCT: 33.8 % — ABNORMAL LOW (ref 39.0–52.0)
HEMOGLOBIN: 11.2 g/dL — AB (ref 13.0–17.0)
Lymphocytes Relative: 27.7 % (ref 12.0–46.0)
Lymphs Abs: 0.8 10*3/uL (ref 0.7–4.0)
MCHC: 33.2 g/dL (ref 30.0–36.0)
MCV: 102.8 fl — ABNORMAL HIGH (ref 78.0–100.0)
MONO ABS: 0.4 10*3/uL (ref 0.1–1.0)
MONOS PCT: 13.7 % — AB (ref 3.0–12.0)
NEUTROS PCT: 52.9 % (ref 43.0–77.0)
Neutro Abs: 1.5 10*3/uL (ref 1.4–7.7)
Platelets: 42 10*3/uL — CL (ref 150.0–400.0)
RBC: 3.29 Mil/uL — ABNORMAL LOW (ref 4.22–5.81)
RDW: 16.1 % — ABNORMAL HIGH (ref 11.5–15.5)
WBC: 2.9 10*3/uL — AB (ref 4.0–10.5)

## 2017-06-05 LAB — COMPREHENSIVE METABOLIC PANEL
ALBUMIN: 2.9 g/dL — AB (ref 3.5–5.2)
ALK PHOS: 126 U/L — AB (ref 39–117)
ALT: 43 U/L (ref 0–53)
AST: 76 U/L — ABNORMAL HIGH (ref 0–37)
BILIRUBIN TOTAL: 1.9 mg/dL — AB (ref 0.2–1.2)
BUN: 7 mg/dL (ref 6–23)
CO2: 23 mEq/L (ref 19–32)
Calcium: 8.4 mg/dL (ref 8.4–10.5)
Chloride: 104 mEq/L (ref 96–112)
Creatinine, Ser: 0.74 mg/dL (ref 0.40–1.50)
GFR: 114.19 mL/min (ref >60.00)
Glucose, Bld: 322 mg/dL — ABNORMAL HIGH (ref 70–99)
POTASSIUM: 3.6 meq/L (ref 3.5–5.1)
Sodium: 134 mEq/L — ABNORMAL LOW (ref 135–145)
TOTAL PROTEIN: 7.1 g/dL (ref 6.0–8.3)

## 2017-06-05 LAB — PROTIME-INR
INR: 1.7 ratio — ABNORMAL HIGH (ref 0.8–1.0)
Prothrombin Time: 18.6 s — ABNORMAL HIGH (ref 9.6–13.1)

## 2017-06-05 MED ORDER — FUROSEMIDE 20 MG PO TABS: 20.0000 mg | ORAL_TABLET | Freq: Every day | ORAL | 2 refills | Status: DC

## 2017-06-05 MED ORDER — RIFAXIMIN 550 MG PO TABS: 550.0000 mg | ORAL_TABLET | Freq: Two times a day (BID) | ORAL | 5 refills | Status: DC

## 2017-06-05 MED ORDER — CIPROFLOXACIN HCL 500 MG PO TABS: 500.0000 mg | ORAL_TABLET | ORAL | 11 refills | Status: DC

## 2017-06-05 NOTE — Patient Instructions (Addendum)
Your physician has requested that you go to the basement for the following lab work before leaving today: CBC, CMET, PT  We have sent the following medications to your pharmacy for you to pick up at your convenience: Cipro 500 mg once every morning indefinetely (AFTER you finish the Levaquin) Lasix 20 mg daily  Restart your Lasix (furosemide) 20 mg daily.  Stay OFF nadolol.  Continue other medications as you are currently taking.  You are scheduled for follow up with Dr Hilarie Fredrickson on 07/28/17 at 1:45 pm.  We will contact you regarding Xifaxan.  If you are age 96 or older, your body mass index should be between 23-30. Your Body mass index is 42.03 kg/m. If this is out of the aforementioned range listed, please consider follow up with your Primary Care Provider.  If you are age 51 or younger, your body mass index should be between 19-25. Your Body mass index is 42.03 kg/m. If this is out of the aformentioned range listed, please consider follow up with your Primary Care Provider.

## 2017-06-05 NOTE — Progress Notes (Addendum)
Subjective:    Patient ID: Adam Butler, male    DOB: 08-05-1955, 61 y.o.   MRN: [id number]  HPI [name] is a pleasant 61 year old white male, well known to Dr. Hilarie Fredrickson with history of decompensated cirrhosis secondary to [place]. He comes in today for post hospital follow-up after recent hospitalization with weakness confusion and fever and found to have a strep bovis bacteremia and Acinetobacter bacteremia. Paracentesis was done after patient was started on IV antibiotics and his cell counts were borderline, and felt to be consistent with SBP. Both organisms also known to gut pathogens. GI pathogen panel during this admission also positive for Yersinia, patient has had some ongoing diarrhea but is also on lactulose and Yersinia was covered by Unasyn which she was treated with during hospitalization and then discharged on Unasyn to complete 10 more days at home after PICC line placed. He is also had problems with recurrent hepatic encephalopathy, has congestive heart failure with EF of 50%, coronary artery disease, and a known 4.1 cm thoracic aneurysm. Patient had an ER visit on 05/27/2017 because his PICC line became dislodged. It was taken out during his ER visit after consultation with infectious disease who advised to be given 2 g of Rocephin and then discharged on Levaquin daily which she says he has a 30 day supply of. Patient says she's been doing well since discharge from the hospital. His energy level has been good. He is eating well has no problems with nausea. He has absolutely no complaints of abdominal pain. He also says she's been sleeping much better than he had been previously. He has upcoming visit with infectious disease He continues to have 4-5 bowel movements per day which is his usual and generally loose with lactulose. No fevers, confusion or lethargy.  Review of Systems Pertinent positive and negative review of systems were noted in the above HPI section.  All other review of  systems was otherwise negative.  Outpatient Encounter Medications as of 06/05/2017  Medication Sig  . Aspirin-Calcium Carbonate (BAYER WOMENS) 81-777 MG TABS Take 81 mg by mouth daily.  Marland Kitchen atorvastatin (LIPITOR) 10 MG tablet Take 10 mg by mouth daily.  . cyanocobalamin 1000 MCG tablet Take 1,000 mcg by mouth daily. Vitamin B12  . fluticasone (FLONASE) 50 MCG/ACT nasal spray Place 2 sprays into both nostrils 3 (three) times daily as needed for allergies or rhinitis (congestion).  . Insulin Glargine (LANTUS) 100 UNIT/ML Solostar Pen Inject 8 Units into the skin daily at 10 pm.  . Insulin Pen Needle (PEN NEEDLES) 31G X 8 MM MISC 8 Units by Does not apply route at bedtime.  . insulin regular (NOVOLIN R,HUMULIN R) 100 units/mL injection Inject 2-4 Units 3 (three) times daily as needed into the skin for high blood sugar (CBG >150).   . lactulose (CHRONULAC) 10 GM/15ML solution Take 30 mLs (20 g total) by mouth at bedtime.  Marland Kitchen levofloxacin (LEVAQUIN) 500 MG tablet Take 1 tablet (500 mg total) daily by mouth. STARTING 05/28/17  . Multiple Vitamin (MULTIVITAMIN WITH MINERALS) TABS tablet Take 1 tablet by mouth daily.  . nitroGLYCERIN (NITROSTAT) 0.4 MG SL tablet Place 1 tablet (0.4 mg total) under the tongue every 5 (five) minutes as needed for chest pain (chest pain).  Marland Kitchen PARoxetine (PAXIL) 10 MG tablet Take 1 tablet (10 mg total) by mouth daily.  . ranitidine (ZANTAC) 150 MG tablet Take 150 mg by mouth See admin instructions. Take 1 tablet (150 mg) by mouth every morning, may also  take 1 tablet at night as needed for acid reflux  . spironolactone (ALDACTONE) 25 MG tablet Take 0.5 tablets (12.5 mg total) by mouth daily.  . ciprofloxacin (CIPRO) 500 MG tablet Take 1 tablet (500 mg total) by mouth every morning.  . furosemide (LASIX) 20 MG tablet Take 1 tablet (20 mg total) by mouth daily.  . rifaximin (XIFAXAN) 550 MG TABS tablet Take 1 tablet (550 mg total) by mouth 2 (two) times daily.   No  facility-administered encounter medications on file as of 06/05/2017.    Allergies  Allergen Reactions  . Isosorbide Nitrate Other (See Comments)    Nose bleeds   Patient Active Problem List   Diagnosis Date Noted  . Chronic diastolic CHF (congestive heart failure) ([place])   . Thoracic ascending aortic aneurysm ([place])   . OA (osteoarthritis) of knee 07/19/2016  . GERD (gastroesophageal reflux disease) 09/29/2015  . Type 2 diabetes mellitus ([place]) 08/18/2015  . CAD in native artery   . Liver cirrhosis secondary to NASH ([place]) 06/06/2015  . Tobacco abuse 05/04/2015  . PFO (patent foramen ovale): Per TEE 04/20/2015 04/20/2015  . Esophageal varices without bleeding ([place]) 08/16/2010   Social History   Socioeconomic History  . Marital status: Married    Spouse name: Not on file  . Number of children: 3  . Years of education: Not on file  . Highest education level: Not on file  Social Needs  . Financial resource strain: Not on file  . Food insecurity - worry: Not on file  . Food insecurity - inability: Not on file  . Transportation needs - medical: Not on file  . Transportation needs - non-medical: Not on file  Occupational History  . 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 Smoking status: Current Every Day Smoker    Packs/day: 0.25    Years: 36.00    Pack years: 9.00    Types: E-cigarettes, Cigarettes  . Smokeless tobacco: Never Used  . Tobacco comment: uses vapor cigarettes (2016 ); I smoke about 4 cigarettes a day  Substance and Sexual Activity  . Alcohol use: No    Alcohol/week: 0.0 oz  . Drug use: No  . Sexual activity: No  Other Topics Concern  . Not on file  Social History Narrative   Married   Gets regular exercise: walking    Mr. Debroux family history includes COPD in his father and mother; Diabetes in his brother and father; Heart attack in his brother and father; Heart attack (age of onset: 64) in  his brother.      Objective:    Vitals:   06/05/17 1320  BP: 120/68  Pulse: 66    Physical Exam; well-developed older white male in no acute distress, pleasant, mentating well blood pressure 120/68 pulse 66, height 5 foot 7, weight 268, BMI 42.0. HEENT; nontraumatic normocephalic EOMI PERRLA sclera anicteric, Cardiovascular ;regular rate and rhythm with S1-S2 soft murmur, Pulmonary clear bilaterally, Abdomen; and some ascites he's nontender bowel sounds are active no palpable mass or hepatosplenomegaly on Rectal ;exam not done, Extremities; trace edema bilateral lower extremities, Neuropsych; mood and affect appropriate mentating well, no asterixis       Assessment & Plan:   #47 61 year old white male with decompensated hepatic cirrhosis secondary to NASH who is seen today in post hospital follow-up after recent admission with a strep bovis bacteremia and Acinetobacter bacteremia and SBP. Patient completed 9 days  of IV Unasyn post discharge, then PICC line became dislodged and was removed. He received IV Rocephin in the ER and 05/27/2017 and is now on Levaquin daily and has a 30 day supply. #2 history of recurrent hepatic encephalopathy-stable currently on lactulose, we have been unable to get Xifaxan approved for him thus far #3 cholelithiasis #4 to onset diabetes mellitus #5 congestive heart failure #6 coronary artery disease #7 4.1 cm thoracic aneurysm #8 history of colon polyp-not removed at time of colonoscopy September 2018 which was done because of diarrhea  Plan; CBC, CMET, PT/INR today Patient will complete his current course of Levaquin and then start Cipro 500 mg by mouth every morning chronically for SBP prophylaxis Continue lactulose 30 mL daily Will try to get patient into patient assistance for Xifaxan Continue spironolactone 12.5 mg by mouth every morning Restart Lasix 20 mg by mouth every morning Will leave off beta blocker Patient will follow-up with Dr. Hilarie Fredrickson in  4-6 weeks, and can discuss need for follow-up colonoscopy with him at that time.    Gadiel John Genia Harold PA-C 06/05/2017   Cc: [name], NP  Addendum: Reviewed and agree with ongoing management. Pyrtle, Lajuan Lines, MD

## 2017-06-06 ENCOUNTER — Ambulatory Visit: Payer: PPO | Admitting: Internal Medicine

## 2017-06-12 ENCOUNTER — Telehealth: Payer: Self-pay | Admitting: Internal Medicine

## 2017-06-12 MED ORDER — LACTULOSE 10 GM/15ML PO SOLN: 20.0000 g | Freq: Every day | ORAL | 3 refills | Status: DC

## 2017-06-12 NOTE — Telephone Encounter (Signed)
Rx for lactulose sent to patient's pharmacy as requested.

## 2017-06-13 ENCOUNTER — Encounter: Payer: Self-pay | Admitting: Infectious Disease

## 2017-06-13 ENCOUNTER — Ambulatory Visit (INDEPENDENT_AMBULATORY_CARE_PROVIDER_SITE_OTHER): Payer: PPO | Admitting: Infectious Disease

## 2017-06-13 VITALS — BP 148/69 | HR 79 | Temp 98.8°F | Wt 264.0 lb

## 2017-06-13 DIAGNOSIS — R7881 Bacteremia: Secondary | ICD-10-CM | POA: Diagnosis not present

## 2017-06-13 DIAGNOSIS — K089 Disorder of teeth and supporting structures, unspecified: Secondary | ICD-10-CM | POA: Diagnosis not present

## 2017-06-13 DIAGNOSIS — A401 Sepsis due to streptococcus, group B: Secondary | ICD-10-CM

## 2017-06-13 DIAGNOSIS — K652 Spontaneous bacterial peritonitis: Secondary | ICD-10-CM | POA: Diagnosis not present

## 2017-06-13 DIAGNOSIS — R6521 Severe sepsis with septic shock: Secondary | ICD-10-CM

## 2017-06-13 DIAGNOSIS — I330 Acute and subacute infective endocarditis: Secondary | ICD-10-CM

## 2017-06-13 DIAGNOSIS — A498 Other bacterial infections of unspecified site: Secondary | ICD-10-CM | POA: Diagnosis not present

## 2017-06-13 DIAGNOSIS — A419 Sepsis, unspecified organism: Secondary | ICD-10-CM

## 2017-06-13 DIAGNOSIS — A491 Streptococcal infection, unspecified site: Secondary | ICD-10-CM | POA: Diagnosis not present

## 2017-06-13 MED ORDER — LEVOFLOXACIN 500 MG PO TABS: 500.0000 mg | ORAL_TABLET | Freq: Every day | ORAL | 11 refills | Status: DC

## 2017-06-13 MED FILL — levoFLOXacin 500 MG TABS: 500 | 30 days supply | Qty: 30 | Fill #0

## 2017-06-13 NOTE — Patient Instructions (Signed)
Please continue to take the levofloxacin 567m daily   (you could also try it every other day down the road)  It will cost roughly $5 at [facility] This drug is MUCH better in covering the GHodgesbacteria that have gotten into your bloodstream than the Cipro  Also you ABSOLUTELY need to have your teeth looked at by dentist or oral surgeon

## 2017-06-13 NOTE — Progress Notes (Signed)
Subjective:   Chief complaint: Here is here for follow-up for his recent bacteremia and for SBP prevention  Patient ID: Adam Butler, male    DOB: 12/31/1955, 61 y.o.   MRN: [id number]  HPI  61 y.o. male with cryptogenic cirrhosis with admission with hepatic encephalopathy with blood cultures with Streptococcus gallyolyticus in 2/2 blood cultures and acinetobacter in 1/2 blood cultures.  One thought is that he may have acquired this bacteremia from SBP.  Note he has had several bacteremias over the last few years including viridans group streptococcus, group B streptococcal bacteremia and Klebsiella pneumonia bacteremia.  He completed a course of therapy with intravenous Unasyn by November 18. ( he had TEE negative for vegetations)  He was then placed on levofloxacin 500 mg daily.  GI, Dr Hilarie Fredrickson had wanted pt on cipro for SBP prophylaxis but I would prefer levaquin given that it is SUPERIOR for gram positives and Cipro is in this man has had several gram-positive bacteremias.  On exam he is fairly comfortable he has fairly poor dentition up especially in the left teeth in the front of his mouth and does not have a dental plan.  I worry that some of his bacteremia is could be odontogenic in origin.  Past Medical History:  Diagnosis Date  . Arthritis   . Cholelithiasis   . Cirrhosis ([place]) 2011   Cryptogenic, Likely NASH. Family/pt deny EtOH. HCV, HBV, HAV negative. ANA negative. AMA positive. Ascites 12/11  . Coronary artery disease    Inferior MI 12/11; LHC with occluded mid CFX and 80% proximal RCA. EF 55%. He had 3.0 x 28 vision BMS to CFX  . Diastolic CHF, acute ([place])    Echo 12/11 with ef 50-55% and mild LVH. EF 55% by LV0gram in 12/11  . Esophageal varices ([place]) 2011, 2013   no hx acute variceal bleed  . Hepatic encephalopathy ([place]) 2011, 12/2013  . Hyperlipemia   . Myocardial infarction [facility]) ? 2012  . PFO (patent foramen ovale): Per TEE 04/20/2015 04/20/2015  . Portal  hypertension ([place])   . Rectal varices   . S/P coronary artery stent placement 06/2010  . SVT (supraventricular tachycardia) ([place])    1/12: appeared to be an ectopic atrial tachycardia. Required DCCV with hemodynamic instability  . Type II diabetes mellitus ([place]) 2011    Past Surgical History:  Procedure Laterality Date  . APPENDECTOMY    . CARDIAC CATHETERIZATION  07/01/2010   BMS to CFX.  Marland Kitchen CATARACT EXTRACTION W/PHACO Left 04/28/2016   Procedure: CATARACT EXTRACTION PHACO AND INTRAOCULAR LENS PLACEMENT (IOC);  Surgeon: Leandrew Koyanagi, MD;  Location: [facility] ORS;  Service: Ophthalmology;  Laterality: Left;  Lot# [id number] H [place]: 05:31.7 AP%: 28.2 CDE: 93.48   . COLONOSCOPY  04/13/2012   Procedure: COLONOSCOPY;  Surgeon: Inda Castle, MD;  Location: [facility] ENDOSCOPY;  Service: Endoscopy;  Laterality: N/A;  . COLONOSCOPY WITH PROPOFOL N/A 03/22/2017   Procedure: COLONOSCOPY WITH PROPOFOL;  Surgeon: Doran Stabler, MD;  Location: [facility] ENDOSCOPY;  Service: Gastroenterology;  Laterality: N/A;  . CORONARY ANGIOPLASTY    . CORONARY STENT PLACEMENT  06/30/2010   CFX   Distal        . ESOPHAGEAL BANDING N/A 01/01/2014   Procedure: ESOPHAGEAL BANDING;  Surgeon: Jerene Bears, MD;  Location: [facility] ENDOSCOPY;  Service: Endoscopy;  Laterality: N/A;  . ESOPHAGOGASTRODUODENOSCOPY  04/13/2012   Procedure: ESOPHAGOGASTRODUODENOSCOPY (EGD);  Surgeon: Inda Castle, MD;  Location: [facility];  Service: Endoscopy;  Laterality:  N/A;  . ESOPHAGOGASTRODUODENOSCOPY N/A 01/01/2014   Procedure: ESOPHAGOGASTRODUODENOSCOPY (EGD);  Surgeon: Jerene Bears, MD;  Location: [facility] ENDOSCOPY;  Service: Endoscopy;  Laterality: N/A;  . ESOPHAGOGASTRODUODENOSCOPY (EGD) WITH PROPOFOL N/A 07/12/2016   Procedure: ESOPHAGOGASTRODUODENOSCOPY (EGD) WITH PROPOFOL;  Surgeon: Jerene Bears, MD;  Location: [facility];  Service: Gastroenterology;  Laterality: N/A;  . LEFT HEART CATHETERIZATION WITH CORONARY ANGIOGRAM N/A 10/15/2014    Procedure: LEFT HEART CATHETERIZATION WITH CORONARY ANGIOGRAM;  Surgeon: Peter M Martinique, MD;  Location: [facility] CATH LAB;  Service: Cardiovascular;  Laterality: N/A;  . orif r leg    . TEE WITHOUT CARDIOVERSION N/A 04/20/2015   Procedure: TRANSESOPHAGEAL ECHOCARDIOGRAM (TEE);  Surgeon: Fay Records, MD;  Location: [facility];  Service: Cardiovascular;  Laterality: N/A;  . TEE WITHOUT CARDIOVERSION N/A 05/17/2017   Procedure: TRANSESOPHAGEAL ECHOCARDIOGRAM (TEE);  Surgeon: Josue Hector, MD;  Location: [facility] END[facility]COPY;  Service: Cardiovascular;  Laterality: N/A;    Family History  Problem Relation Age of Onset  . Heart attack Brother 91       MI  . Diabetes Brother   . Heart attack Father   . Diabetes Father   . COPD Father   . COPD Mother   . Heart attack Brother   . Stroke Neg Hx       Social History   Socioeconomic History  . Marital status: Married    Spouse name: None  . Number of children: 3  . Years of education: None  . Highest education level: None  Social Needs  . Financial resource strain: None  . Food insecurity - worry: None  . Food insecurity - inability: None  . Transportation needs - medical: None  . Transportation needs - non-medical: None  Occupational History  . 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 Smoking status: Current Every Day Smoker    Packs/day: 0.25    Years: 36.00    Pack years: 9.00    Types: E-cigarettes, Cigarettes  . Smokeless tobacco: Never Used  . Tobacco comment: uses vapor cigarettes (2016 ); I smoke about 4 cigarettes a day  Substance and Sexual Activity  . Alcohol use: No    Alcohol/week: 0.0 oz  . Drug use: No  . Sexual activity: No  Other Topics Concern  . None  Social History Narrative   Married   Gets regular exercise: walking    Allergies  Allergen Reactions  . Isosorbide Nitrate Other (See Comments)    Nose bleeds     Current Outpatient Medications:  .   Aspirin-Calcium Carbonate (BAYER WOMENS) 81-777 MG TABS, Take 81 mg by mouth daily., Disp: , Rfl:  .  atorvastatin (LIPITOR) 10 MG tablet, Take 10 mg by mouth daily., Disp: , Rfl:  .  cyanocobalamin 1000 MCG tablet, Take 1,000 mcg by mouth daily. Vitamin B12, Disp: , Rfl:  .  fluticasone (FLONASE) 50 MCG/ACT nasal spray, Place 2 sprays into both nostrils 3 (three) times daily as needed for allergies or rhinitis (congestion)., Disp: , Rfl:  .  furosemide (LASIX) 20 MG tablet, Take 1 tablet (20 mg total) by mouth daily., Disp: 30 tablet, Rfl: 2 .  Insulin Glargine (LANTUS) 100 UNIT/ML Solostar Pen, Inject 8 Units into the skin daily at 10 pm., Disp: 15 mL, Rfl: 0 .  Insulin Pen Needle (PEN NEEDLES) 31G X 8 MM MISC, 8 Units by Does not apply route at bedtime., Disp: 100 each,  Rfl: 0 .  insulin regular (NOVOLIN R,HUMULIN R) 100 units/mL injection, Inject 2-4 Units 3 (three) times daily as needed into the skin for high blood sugar (CBG >150). , Disp: , Rfl:  .  lactulose (CHRONULAC) 10 GM/15ML solution, Take 30 mLs (20 g total) by mouth at bedtime., Disp: 240 mL, Rfl: 3 .  levofloxacin (LEVAQUIN) 500 MG tablet, Take 1 tablet (500 mg total) by mouth daily. STARTING 05/28/17, Disp: 30 tablet, Rfl: 11 .  Multiple Vitamin (MULTIVITAMIN WITH MINERALS) TABS tablet, Take 1 tablet by mouth daily., Disp: , Rfl:  .  nitroGLYCERIN (NITROSTAT) 0.4 MG SL tablet, Place 1 tablet (0.4 mg total) under the tongue every 5 (five) minutes as needed for chest pain (chest pain)., Disp: 25 tablet, Rfl: 5 .  PARoxetine (PAXIL) 10 MG tablet, Take 1 tablet (10 mg total) by mouth daily., Disp: 30 tablet, Rfl: 5 .  ranitidine (ZANTAC) 150 MG tablet, Take 150 mg by mouth See admin instructions. Take 1 tablet (150 mg) by mouth every morning, may also take 1 tablet at night as needed for acid reflux, Disp: , Rfl:  .  spironolactone (ALDACTONE) 25 MG tablet, Take 0.5 tablets (12.5 mg total) by mouth daily., Disp: 45 tablet, Rfl:  3     Review of Systems  Constitutional: Negative for activity change, appetite change, chills, diaphoresis, fatigue, fever and unexpected weight change.  HENT: Negative for congestion, rhinorrhea, sinus pressure, sneezing, sore throat and trouble swallowing.   Eyes: Negative for photophobia and visual disturbance.  Respiratory: Negative for cough, chest tightness, shortness of breath, wheezing and stridor.   Cardiovascular: Negative for chest pain, palpitations and leg swelling.  Gastrointestinal: Positive for abdominal distention. Negative for abdominal pain, anal bleeding, blood in stool, constipation, diarrhea, nausea and vomiting.  Genitourinary: Negative for difficulty urinating, dysuria, flank pain and hematuria.  Musculoskeletal: Negative for arthralgias, back pain, gait problem, joint swelling and myalgias.  Skin: Negative for color change, pallor, rash and wound.  Neurological: Negative for dizziness, tremors, weakness and light-headedness.  Hematological: Negative for adenopathy. Does not bruise/bleed easily.  Psychiatric/Behavioral: Negative for agitation, behavioral problems, confusion, decreased concentration, dysphoric mood and sleep disturbance.       Objective:   Physical Exam  Constitutional: He is oriented to person, place, and time. He appears well-developed and well-nourished.  HENT:  Head: Normocephalic and atraumatic.  Mouth/Throat:    Eyes: Conjunctivae and EOM are normal.  Neck: Normal range of motion. Neck supple.  Cardiovascular: Normal rate, regular rhythm and normal heart sounds. Exam reveals no gallop and no friction rub.  No murmur heard. Pulmonary/Chest: Effort normal and breath sounds normal. No respiratory distress. He has no wheezes. He has no rales. He exhibits no tenderness.  Abdominal: Soft. He exhibits no distension. There is no tenderness. There is no rebound.  Musculoskeletal: Normal range of motion. He exhibits edema. He exhibits no  tenderness.  Neurological: He is alert and oriented to person, place, and time.  Skin: Skin is warm and dry. No rash noted. No erythema. No pallor.  Psychiatric: He has a normal mood and affect. Thought content normal.          Assessment & Plan:   Streptococcus bovis bacteremia: His transesophageal echocardiogram was without vegetations.  He had a colonoscopy a few weeks to months prior to onset of his bacteremia and had no evidence of malignancy.  We suspect the Streptococcus bovis made its way into his intra-abdominal cavity and caused SBP that then seated the  blood.  I think having him on SBP prophylaxis is prudent.  I do think that the antibiotic though should cover gram-positive is better than ciprofloxacin does.  I also suspect that he may have had other bacteremias through different portals besides SBP.  Given his cirrhosis he does not going to clear organisms as easily as others would and he has several other potential sources of infection including his very poor dentition.  We will continue levofloxacin for now and I would like him to see his primary care physician.  1 of the issues with levofloxacin for the patient was higher cost but we were able to get it filled at [facility] for roughly $5 for 30 days.  SBP prevention: I think levofloxacin will work quite well and he may not indeed need to necessarily take it every day but we will start off that way.  He will follow-up with Dr. Hilarie Fredrickson.  Poor dentition: I worry that some of his bacteremias may be due to an odontogenic source.  I have encouraged him to get a dental plan and be seen by a dentist in the next few months of 2019  I spent greater than 25 minutes with the patient including greater than 50% of time in face to face counsel of the patient and in coordination of his care with pharmacy, specific counseling with regards to why worse picking certain antibiotics over other ones and risks he has for recurrent  bacteremia is in the future.

## 2017-06-21 ENCOUNTER — Encounter: Payer: PPO | Admitting: Cardiothoracic Surgery

## 2017-06-30 ENCOUNTER — Ambulatory Visit (INDEPENDENT_AMBULATORY_CARE_PROVIDER_SITE_OTHER)
Admission: RE | Admit: 2017-06-30 | Discharge: 2017-06-30 | Disposition: A | Discharge status: Home / Self Care | Payer: PPO | Source: Ambulatory Visit | Attending: Family Medicine | Admitting: Family Medicine

## 2017-06-30 ENCOUNTER — Ambulatory Visit: Payer: PPO | Admitting: Family Medicine

## 2017-06-30 ENCOUNTER — Other Ambulatory Visit: Payer: Self-pay | Admitting: *Deleted

## 2017-06-30 ENCOUNTER — Encounter: Payer: Self-pay | Admitting: Family Medicine

## 2017-06-30 VITALS — BP 110/60 | HR 90 | Temp 97.6°F | Wt 272.7 lb

## 2017-06-30 DIAGNOSIS — S299XXA Unspecified injury of thorax, initial encounter: Secondary | ICD-10-CM | POA: Diagnosis not present

## 2017-06-30 DIAGNOSIS — S20211A Contusion of right front wall of thorax, initial encounter: Secondary | ICD-10-CM

## 2017-06-30 DIAGNOSIS — R0781 Pleurodynia: Secondary | ICD-10-CM | POA: Diagnosis not present

## 2017-06-30 DIAGNOSIS — S298XXA Other specified injuries of thorax, initial encounter: Secondary | ICD-10-CM

## 2017-06-30 MED ORDER — LACTULOSE 10 GM/15ML PO SOLN: 20.0000 g | Freq: Every day | ORAL | 3 refills | Status: DC

## 2017-06-30 MED ORDER — TRAMADOL HCL 50 MG PO TABS: 50.0000 mg | ORAL_TABLET | Freq: Three times a day (TID) | ORAL | 0 refills | Status: DC | PRN

## 2017-06-30 NOTE — Patient Instructions (Addendum)
Rib Contusion A rib contusion is a deep bruise on your rib area. Contusions are the result of a blunt trauma that causes bleeding and injury to the tissues under the skin. A rib contusion may involve bruising of the ribs and of the skin and muscles in the area. The skin overlying the contusion may turn blue, purple, or yellow. Minor injuries will give you a painless contusion, but more severe contusions may stay painful and swollen for a few weeks. What are the causes? A contusion is usually caused by a blow, trauma, or direct force to an area of the body. This often occurs while playing contact sports. What are the signs or symptoms?  Swelling and redness of the injured area.  Discoloration of the injured area.  Tenderness and soreness of the injured area.  Pain with or without movement. How is this diagnosed? The diagnosis can be made by taking a medical history and performing a physical exam. An X-ray, CT scan, or MRI may be needed to determine if there were any associated injuries, such as broken bones (fractures) or internal injuries. How is this treated? Often, the best treatment for a rib contusion is rest. Icing or applying cold compresses to the injured area may help reduce swelling and inflammation. Deep breathing exercises may be recommended to reduce the risk of partial lung collapse and pneumonia. Over-the-counter or prescription medicines may also be recommended for pain control. Follow these instructions at home:  Apply ice to the injured area: ? Put ice in a plastic bag. ? Place a towel between your skin and the bag. ? Leave the ice on for 20 minutes, 2-3 times per day.  Take medicines only as directed by your health care provider.  Rest the injured area. Avoid strenuous activity and any activities or movements that cause pain. Be careful during activities and avoid bumping the injured area.  Perform deep-breathing exercises as directed by your health care provider.  Do  not lift anything that is heavier than 5 lb (2.3 kg) until your health care provider approves.  Do not use any tobacco products, including cigarettes, chewing tobacco, or electronic cigarettes. If you need help quitting, ask your health care provider. Contact a health care provider if:  You have increased bruising or swelling.  You have pain that is not controlled with treatment.  You have a fever. Get help right away if:  You have difficulty breathing or shortness of breath.  You develop a continual cough, or you cough up thick or bloody sputum.  You feel sick to your stomach (nauseous), you throw up (vomit), or you have abdominal pain. This information is not intended to replace advice given to you by your health care provider. Make sure you discuss any questions you have with your health care provider. Document Released: 03/22/2001 Document Revised: 12/03/2015 Document Reviewed: 04/08/2014 Elsevier Interactive Patient Education  [employer].

## 2017-06-30 NOTE — Progress Notes (Signed)
Subjective:    Patient ID: Adam Butler, male    DOB: 1955-10-25, 61 y.o.   MRN: [id number]  No chief complaint on file.   HPI Patient was seen today for acute visit.  Patient states on Wednesday evening while placing boards on the deck he slipped and fell from a hands and knees position onto his right side.  Patient now endorsing sharp, right-sided chest pain with palpation, deep breaths, and moving a certain way.  Pain worse at night when trying to move around in bed.  Tylenol has not helped.  Patient denies shortness of breath, nausea, vomiting, bruising.  Past Medical History:  Diagnosis Date  . Arthritis   . Cholelithiasis   . Cirrhosis ([place]) 2011   Cryptogenic, Likely NASH. Family/pt deny EtOH. HCV, HBV, HAV negative. ANA negative. AMA positive. Ascites 12/11  . Coronary artery disease    Inferior MI 12/11; LHC with occluded mid CFX and 80% proximal RCA. EF 55%. He had 3.0 x 28 vision BMS to CFX  . Diastolic CHF, acute ([place])    Echo 12/11 with ef 50-55% and mild LVH. EF 55% by LV0gram in 12/11  . Esophageal varices ([place]) 2011, 2013   no hx acute variceal bleed  . Hepatic encephalopathy ([place]) 2011, 12/2013  . Hyperlipemia   . Myocardial infarction [facility]) ? 2012  . PFO (patent foramen ovale): Per TEE 04/20/2015 04/20/2015  . Portal hypertension ([place])   . Rectal varices   . S/P coronary artery stent placement 06/2010  . SVT (supraventricular tachycardia) ([place])    1/12: appeared to be an ectopic atrial tachycardia. Required DCCV with hemodynamic instability  . Type II diabetes mellitus ([place]) 2011    Allergies  Allergen Reactions  . Isosorbide Nitrate Other (See Comments)    Nose bleeds    ROS General: Denies fever, chills, night sweats, changes in weight, changes in appetite HEENT: Denies headaches, ear pain, changes in vision, rhinorrhea, sore throat CV: Denies palpitations, SOB, orthopnea   +chest pain Pulm: Denies SOB, cough, wheezing GI: Denies abdominal pain,  nausea, vomiting, diarrhea, constipation GU: Denies dysuria, hematuria, frequency, vaginal discharge Msk: Denies muscle cramps, joint pains Neuro: Denies weakness, numbness, tingling Skin: Denies rashes, bruising Psych: Denies depression, anxiety, hallucinations     Objective:    Blood pressure 110/60, pulse 90, temperature 97.6 F (36.4 C), temperature source Oral, weight 272 lb 11.2 oz (123.7 kg).   Gen. Pleasant, obese, well-nourished, in no distress, normal affect  HEENT: several skin tags on R eye lid, face and neck.  [place]/AT, face symmetric, no scleral icterus, PERRLA, EOMI, nares patent without drainage Lungs: no accessory muscle use, CTAB, no wheezes or rales Cardiovascular: RRR, no m/r/g, no peripheral edema Musculoskeletal: No deformities, TTP of R Lateral chest at 6 th rib, no ecchymosis.  Lower right quadrant of the abdomen with ecchymosis-pt injects insulin at site.  No cyanosis or clubbing, normal tone Neuro:  A&Ox3, CN II-XII intact, normal gait  Wt Readings from Last 3 Encounters:  06/30/17 272 lb 11.2 oz (123.7 kg)  06/13/17 264 lb (119.7 kg)  06/05/17 268 lb 6 oz (121.7 kg)    Lab Results  Component Value Date   WBC 2.9 (L) 06/05/2017   HGB 11.2 (L) 06/05/2017   HCT 33.8 (L) 06/05/2017   PLT 42.0 Repeated and verified X2. (LL) 06/05/2017   GLUCOSE 322 (H) 06/05/2017   CHOL 133 11/17/2016   TRIG 133.0 11/17/2016   HDL 39.90 11/17/2016   LDLCALC 67 11/17/2016  ALT 43 06/05/2017   AST 76 (H) 06/05/2017   NA 134 (L) 06/05/2017   K 3.6 06/05/2017   CL 104 06/05/2017   CREATININE 0.74 06/05/2017   BUN 7 06/05/2017   CO2 23 06/05/2017   TSH 1.630 12/29/2013   PSA 0.23 11/17/2016   INR 1.7 (H) 06/05/2017   HGBA1C 6.9 (H) 05/29/2017   MICROALBUR 1.0 05/29/2017    Assessment/Plan:  Contusion of rib on right side, initial encounter  -Discussed rib contusion versus rib fracture. -Patient encouraged to take deep breaths and every hour or so to prevent  atelectasis and possible pneumonia. -Patient given RTC or ED precautions. - Plan: DG Chest 2 View, traMADol (ULTRAM) 50 MG tablet   Follow-up PRN  Grier Mitts, MD

## 2017-07-28 ENCOUNTER — Other Ambulatory Visit (INDEPENDENT_AMBULATORY_CARE_PROVIDER_SITE_OTHER): Payer: PPO

## 2017-07-28 ENCOUNTER — Encounter: Payer: Self-pay | Admitting: Internal Medicine

## 2017-07-28 ENCOUNTER — Ambulatory Visit (INDEPENDENT_AMBULATORY_CARE_PROVIDER_SITE_OTHER): Payer: PPO | Admitting: Internal Medicine

## 2017-07-28 VITALS — BP 134/66 | HR 84 | Ht 67.0 in | Wt 268.1 lb

## 2017-07-28 DIAGNOSIS — K652 Spontaneous bacterial peritonitis: Secondary | ICD-10-CM

## 2017-07-28 DIAGNOSIS — Z86010 Personal history of colonic polyps: Secondary | ICD-10-CM | POA: Diagnosis not present

## 2017-07-28 DIAGNOSIS — K7581 Nonalcoholic steatohepatitis (NASH): Secondary | ICD-10-CM | POA: Diagnosis not present

## 2017-07-28 DIAGNOSIS — K7460 Unspecified cirrhosis of liver: Secondary | ICD-10-CM

## 2017-07-28 DIAGNOSIS — K089 Disorder of teeth and supporting structures, unspecified: Secondary | ICD-10-CM | POA: Diagnosis not present

## 2017-07-28 DIAGNOSIS — I8500 Esophageal varices without bleeding: Secondary | ICD-10-CM | POA: Diagnosis not present

## 2017-07-28 LAB — PROTIME-INR
INR: 1.5 ratio — ABNORMAL HIGH (ref 0.8–1.0)
Prothrombin Time: 16.1 s — ABNORMAL HIGH (ref 9.6–13.1)

## 2017-07-28 LAB — CBC WITH DIFFERENTIAL/PLATELET
BASOS PCT: 0.8 % (ref 0.0–3.0)
Basophils Absolute: 0 10*3/uL (ref 0.0–0.1)
Eosinophils Absolute: 0.2 10*3/uL (ref 0.0–0.7)
Eosinophils Relative: 3.6 % (ref 0.0–5.0)
HEMATOCRIT: 36.3 % — AB (ref 39.0–52.0)
HEMOGLOBIN: 12.2 g/dL — AB (ref 13.0–17.0)
LYMPHS PCT: 19.7 % (ref 12.0–46.0)
Lymphs Abs: 0.9 10*3/uL (ref 0.7–4.0)
MCHC: 33.6 g/dL (ref 30.0–36.0)
MCV: 95.6 fl (ref 78.0–100.0)
MONOS PCT: 11.8 % (ref 3.0–12.0)
Monocytes Absolute: 0.5 10*3/uL (ref 0.1–1.0)
NEUTROS ABS: 2.9 10*3/uL (ref 1.4–7.7)
Neutrophils Relative %: 64.1 % (ref 43.0–77.0)
RBC: 3.79 Mil/uL — ABNORMAL LOW (ref 4.22–5.81)
RDW: 16.3 % — AB (ref 11.5–15.5)
WBC: 4.5 10*3/uL (ref 4.0–10.5)

## 2017-07-28 LAB — COMPREHENSIVE METABOLIC PANEL
ALT: 37 U/L (ref 0–53)
AST: 53 U/L — AB (ref 0–37)
Albumin: 2.6 g/dL — ABNORMAL LOW (ref 3.5–5.2)
Alkaline Phosphatase: 233 U/L — ABNORMAL HIGH (ref 39–117)
BUN: 9 mg/dL (ref 6–23)
CALCIUM: 8.5 mg/dL (ref 8.4–10.5)
CHLORIDE: 100 meq/L (ref 96–112)
CO2: 25 mEq/L (ref 19–32)
Creatinine, Ser: 0.8 mg/dL (ref 0.40–1.50)
GFR: 104.32 mL/min (ref >60.00)
Glucose, Bld: 299 mg/dL — ABNORMAL HIGH (ref 70–99)
POTASSIUM: 3.1 meq/L — AB (ref 3.5–5.1)
Sodium: 131 mEq/L — ABNORMAL LOW (ref 135–145)
Total Bilirubin: 2.5 mg/dL — ABNORMAL HIGH (ref 0.2–1.2)
Total Protein: 6.8 g/dL (ref 6.0–8.3)

## 2017-07-28 MED ORDER — SOD PICOSULFATE-MAG OX-CIT ACD 10-3.5-12 MG-GM -GM/160ML PO SOLN: 1.0000 | ORAL | 0 refills | Status: DC

## 2017-07-28 MED ORDER — SPIRONOLACTONE 25 MG PO TABS: 25.0000 mg | ORAL_TABLET | Freq: Every day | ORAL | 0 refills | Status: DC

## 2017-07-28 NOTE — Progress Notes (Signed)
Subjective:    Patient ID: Adam Butler, male    DOB: 12-05-1955, 62 y.o.   MRN: [id number]  HPI Adam Butler is a 62 year old male with a past medical history of NASH cirrhosis with portal hypertension manifested by ascites, esophageal varices, hepatic encephalopathy and recent admission with SBP and strep bovis and Acinetobacter bacteremia.  He is here for follow-up.  He reports that he is feeling well.  He denies abdominal pain.  No recent confusion.  He has had some lower extremity edema but denies increasing abdominal girth.  Reports good appetite.  He is trying to avoid sodium but admits to eating out frequently.  Denies blood in his stool and melena.  Continues lactulose and has 3-4 bowel movements per day.  No bleeding.  He followed up with infectious disease on 06/13/2017  and the decision was made to continue levofloxacin as prophylaxis against SBP after he completed antibiotic therapy for his bacteremia.  Review of Systems As per HPI, otherwise negative  Current Medications, Allergies, Past Medical History, Past Surgical History, Family History and Social History were reviewed in [employer] record.     Objective:   Physical Exam BP 134/66   Pulse 84   Ht 5' 7"  (1.702 m)   Wt 268 lb 2 oz (121.6 kg)   BMI 41.99 kg/m  Constitutional: Well-developed somewhat chronically ill-appearing. No distress. HEENT: Normocephalic and atraumatic. Oropharynx is clear and dentition is poor. conjunctivae are normal.   Neck: Neck supple. Trachea midline. Cardiovascular: Normal rate, regular rhythm and intact distal pulses.  Pulmonary/chest: Effort normal and breath sounds normal. No wheezing, rales or rhonchi. Abdominal: Soft, obese making it hard to determine ascites, nontender, nondistended. Bowel sounds active throughout.  Extremities: no clubbing, cyanosis, 1+ pretib edema Neurological: Alert and oriented to person place and time. No asterixis Skin: Skin is  warm and dry. Psychiatric: Normal mood and affect. Behavior is normal.  CBC    Component Value Date/Time   WBC 4.5 07/28/2017 1426   RBC 3.79 (L) 07/28/2017 1426   HGB 12.2 (L) 07/28/2017 1426   HCT 36.3 (L) 07/28/2017 1426   PLT 46.0 Repeated and verified X2. (LL) 07/28/2017 1426   MCV 95.6 07/28/2017 1426   MCH 33.7 05/18/2017 0457   MCHC 33.6 07/28/2017 1426   RDW 16.3 (H) 07/28/2017 1426   LYMPHSABS 0.9 07/28/2017 1426   MONOABS 0.5 07/28/2017 1426   EOSABS 0.2 07/28/2017 1426   BASOSABS 0.0 07/28/2017 1426   CMP     Component Value Date/Time   NA 131 (L) 07/28/2017 1426   K 3.1 (L) 07/28/2017 1426   CL 100 07/28/2017 1426   CO2 25 07/28/2017 1426   GLUCOSE 299 (H) 07/28/2017 1426   BUN 9 07/28/2017 1426   CREATININE 0.80 07/28/2017 1426   CREATININE 0.74 03/15/2011 0827   CALCIUM 8.5 07/28/2017 1426   PROT 6.8 07/28/2017 1426   ALBUMIN 2.6 (L) 07/28/2017 1426   AST 53 (H) 07/28/2017 1426   ALT 37 07/28/2017 1426   ALKPHOS 233 (H) 07/28/2017 1426   BILITOT 2.5 (H) 07/28/2017 1426   GFRNONAA >60 05/18/2017 0457   GFRAA >60 05/18/2017 0457   Lab Results  Component Value Date   INR 1.5 (H) 07/28/2017   INR 1.7 (H) 06/05/2017   INR 1.76 05/12/2017       Assessment & Plan:  62 year old male with a past medical history of NASH cirrhosis with portal hypertension manifested by ascites, esophageal varices, hepatic encephalopathy  and recent admission with SBP and strep bovis and Acinetobacter bacteremia.   1. NASH cirrhosis (CP B) --he has improved after being admitted with bacteremia and SBP. --Continue levofloxacin daily as prophylaxis for SBP --Need to repeat upper endoscopy for variceal screening and likely banding.  He is off beta-blocker given his history of SBP and he had medium-sized varices at last endoscopy. --Needs repeat abdominal ultrasound --CBC, CMP and INR today --Increase spironolactone to 50 mg daily, continue Lasix 20 mg daily.  Potassium is  slightly low and should improve with increase in spironolactone.  Sodium is low which may limit further diuretic titration --Low-sodium diet which we discussed today --Continue lactulose at current dose.  No recent evidence of hepatic encephalopathy  2.  Poor dentition --dental referral.  This is likely contributing to his bacteremia/SBP  3.  Strep bovis bacteremia --had colonoscopy in September 2018 with Dr. Rachael Darby.  A colon polyp was seen but not removed due to his acute illness.  Repeat colonoscopy recommended.  We discussed the risk, benefits and alternatives to upper endoscopy and colonoscopy and he is agreeable and wishes to proceed.  These will be performed in the outpatient hospital setting with monitored anesthesia care.  25 minutes spent with the patient today. Greater than 50% was spent in counseling and coordination of care with the patient

## 2017-07-28 NOTE — Patient Instructions (Addendum)
Your physician has requested that you go to the basement for the following lab work before leaving today: CBC, CMET, INR  You have been scheduled for an endoscopy and colonoscopy. Please follow the written instructions given to you at your visit today. Please pick up your prep supplies at the pharmacy within the next 1-3 days. If you use inhalers (even only as needed), please bring them with you on the day of your procedure. Your physician has requested that you go to www.startemmi.com and enter the access code given to you at your visit today. This web site gives a general overview about your procedure. However, you should still follow specific instructions given to you by our office regarding your preparation for the procedure.  You have been scheduled for an abdominal ultrasound at [facility] (1st floor of hospital) on Wednesday, 08/02/17 at 11:30 am. Please arrive 15 minutes prior to your appointment for registration. Make certain not to have anything to eat or drink 6 hours prior to your appointment. Should you need to reschedule your appointment, please contact radiology at [phone]. This test typically takes about 30 minutes to perform.  Please contact Posey Pronto, DSS to schedule an appointment for dental work once you have looked over your insurance benefits. His phone number is [phone]  Please take spironolactone 1 tablet daily.  If you are age 74 or older, your body mass index should be between 23-30. Your Body mass index is 41.99 kg/m. If this is out of the aforementioned range listed, please consider follow up with your Primary Care Provider.  If you are age 65 or younger, your body mass index should be between 19-25. Your Body mass index is 41.99 kg/m. If this is out of the aformentioned range listed, please consider follow up with your Primary Care Provider.

## 2017-07-31 ENCOUNTER — Other Ambulatory Visit: Payer: Self-pay

## 2017-07-31 DIAGNOSIS — K7460 Unspecified cirrhosis of liver: Secondary | ICD-10-CM

## 2017-08-02 ENCOUNTER — Ambulatory Visit (HOSPITAL_COMMUNITY)
Admission: RE | Admit: 2017-08-02 | Discharge: 2017-08-02 | Disposition: A | Discharge status: Home / Self Care | Payer: PPO | Source: Ambulatory Visit | Attending: Internal Medicine | Admitting: Internal Medicine

## 2017-08-02 DIAGNOSIS — K766 Portal hypertension: Secondary | ICD-10-CM | POA: Insufficient documentation

## 2017-08-02 DIAGNOSIS — Z860100 Personal history of colon polyps, unspecified: Secondary | ICD-10-CM

## 2017-08-02 DIAGNOSIS — R188 Other ascites: Secondary | ICD-10-CM | POA: Diagnosis not present

## 2017-08-02 DIAGNOSIS — K7581 Nonalcoholic steatohepatitis (NASH): Secondary | ICD-10-CM | POA: Insufficient documentation

## 2017-08-02 DIAGNOSIS — K8020 Calculus of gallbladder without cholecystitis without obstruction: Secondary | ICD-10-CM | POA: Insufficient documentation

## 2017-08-02 DIAGNOSIS — I700 Atherosclerosis of aorta: Secondary | ICD-10-CM | POA: Diagnosis not present

## 2017-08-02 DIAGNOSIS — K7460 Unspecified cirrhosis of liver: Secondary | ICD-10-CM

## 2017-08-02 DIAGNOSIS — K7469 Other cirrhosis of liver: Secondary | ICD-10-CM

## 2017-08-02 DIAGNOSIS — R161 Splenomegaly, not elsewhere classified: Secondary | ICD-10-CM | POA: Diagnosis not present

## 2017-08-07 MED FILL — levoFLOXacin 500 MG TABS: 500 | 30 days supply | Qty: 30 | Fill #1

## 2017-09-18 MED FILL — levoFLOXacin 500 MG TABS: 500 | 30 days supply | Qty: 30 | Fill #2

## 2017-10-05 ENCOUNTER — Telehealth: Payer: Self-pay | Admitting: Internal Medicine

## 2017-10-05 NOTE — Telephone Encounter (Signed)
Pt states he started having dry heaves on Tuesday. Yesterday he vomited most of the day. Reports he is able to keep medications down saltines and water. Reports he can't keep any other liquid down. Pt wanted to know if he should cancel his EGD. Please advise.

## 2017-10-06 NOTE — Telephone Encounter (Signed)
Updated prep instructions mailed to pt.

## 2017-10-06 NOTE — Telephone Encounter (Signed)
See below, patient has called a second time.  Please advise.

## 2017-10-06 NOTE — Telephone Encounter (Signed)
Pt is calling back about his procedure and wants to know if he needs to cancel endo

## 2017-10-06 NOTE — Telephone Encounter (Signed)
Spoke with pt and he states he cannot get the prep down and would like to reschedule. Pts ECL rescheduled at [facility] to 01/02/18@10 :45am. Pt aware of appt.

## 2017-10-06 NOTE — Telephone Encounter (Signed)
In person conversation, Vaughan Basta will reach out to him If unable to prep, then reschedule JMP

## 2017-10-08 ENCOUNTER — Other Ambulatory Visit: Payer: Self-pay | Admitting: Internal Medicine

## 2017-10-24 MED FILL — levoFLOXacin 500 MG TABS: 500 | 30 days supply | Qty: 30 | Fill #3

## 2017-10-30 ENCOUNTER — Ambulatory Visit (INDEPENDENT_AMBULATORY_CARE_PROVIDER_SITE_OTHER): Payer: PPO | Admitting: Family Medicine

## 2017-10-30 ENCOUNTER — Ambulatory Visit (INDEPENDENT_AMBULATORY_CARE_PROVIDER_SITE_OTHER)
Admission: RE | Admit: 2017-10-30 | Discharge: 2017-10-30 | Disposition: A | Discharge status: Home / Self Care | Payer: PPO | Source: Ambulatory Visit | Attending: Family Medicine | Admitting: Family Medicine

## 2017-10-30 ENCOUNTER — Encounter: Payer: Self-pay | Admitting: Family Medicine

## 2017-10-30 VITALS — BP 120/60 | HR 83 | Temp 97.8°F | Ht 67.0 in | Wt 258.5 lb

## 2017-10-30 DIAGNOSIS — K7460 Unspecified cirrhosis of liver: Secondary | ICD-10-CM | POA: Diagnosis not present

## 2017-10-30 DIAGNOSIS — R791 Abnormal coagulation profile: Secondary | ICD-10-CM

## 2017-10-30 DIAGNOSIS — M79671 Pain in right foot: Secondary | ICD-10-CM | POA: Diagnosis not present

## 2017-10-30 DIAGNOSIS — M25571 Pain in right ankle and joints of right foot: Secondary | ICD-10-CM | POA: Diagnosis not present

## 2017-10-30 DIAGNOSIS — K7581 Nonalcoholic steatohepatitis (NASH): Secondary | ICD-10-CM | POA: Diagnosis not present

## 2017-10-30 DIAGNOSIS — M7989 Other specified soft tissue disorders: Secondary | ICD-10-CM | POA: Diagnosis not present

## 2017-10-30 DIAGNOSIS — S99911A Unspecified injury of right ankle, initial encounter: Secondary | ICD-10-CM | POA: Diagnosis not present

## 2017-10-30 DIAGNOSIS — S99921A Unspecified injury of right foot, initial encounter: Secondary | ICD-10-CM | POA: Diagnosis not present

## 2017-10-30 NOTE — Progress Notes (Signed)
Dr. Frederico Hamman T. Jaman Aro, MD, [facility] [place], [address] Phone: [phone] Fax: [phone]  10/30/2017  Patient: Adam Butler, MRN: [id number], DOB: 1956/05/02, 62 y.o.  Primary Physician:  [name], NP   Chief Complaint  Patient presents with  . Fall    on Thursday  . Foot Pain    Right-Swollen and Bruised   Subjective:   Adam Butler is a 62 y.o. very pleasant male patient who presents with the following:  Fall and R foot pain:  Fell on a curve at [employer].  In some way he injured this foot coming off of the curve, is not entirely clear if he had an inversion injury her feet directly forcefully dorsiflex it.  Nevertheless he has had some very significant swelling and bruising throughout the entirety of the foot and ankle and he has having pain throughout much of the foot and ankle now.  He is now using a cane.  Past Medical History, Surgical History, Social History, Family History, Problem List, Medications, and Allergies have been reviewed and updated if relevant.  Patient Active Problem List   Diagnosis Date Noted  . Chronic diastolic CHF (congestive heart failure) ([place])   . Thoracic ascending aortic aneurysm ([place])   . OA (osteoarthritis) of knee 07/19/2016  . GERD (gastroesophageal reflux disease) 09/29/2015  . Type 2 diabetes mellitus ([place]) 08/18/2015  . CAD in native artery   . Liver cirrhosis secondary to NASH ([place]) 06/06/2015  . Tobacco abuse 05/04/2015  . PFO (patent foramen ovale): Per TEE 04/20/2015 04/20/2015  . Esophageal varices without bleeding ([place]) 08/16/2010    Past Medical History:  Diagnosis Date  . Arthritis   . Cholelithiasis   . Cirrhosis ([place]) 2011   Cryptogenic, Likely NASH. Family/pt deny EtOH. HCV, HBV, HAV negative. ANA negative. AMA positive. Ascites 12/11  . Coronary artery disease    Inferior MI 12/11; LHC with occluded mid CFX and 80% proximal RCA.  EF 55%. He had 3.0 x 28 vision BMS to CFX  . Diastolic CHF, acute ([place])    Echo 12/11 with ef 50-55% and mild LVH. EF 55% by LV0gram in 12/11  . Esophageal varices ([place]) 2011, 2013   no hx acute variceal bleed  . Hepatic encephalopathy ([place]) 2011, 12/2013  . Hyperlipemia   . Myocardial infarction [facility]) ? 2012  . PFO (patent foramen ovale): Per TEE 04/20/2015 04/20/2015  . Portal hypertension ([place])   . Rectal varices   . S/P coronary artery stent placement 06/2010  . SVT (supraventricular tachycardia) ([place])    1/12: appeared to be an ectopic atrial tachycardia. Required DCCV with hemodynamic instability  . Type II diabetes mellitus ([place]) 2011    Past Surgical History:  Procedure Laterality Date  . APPENDECTOMY    . CARDIAC CATHETERIZATION  07/01/2010   BMS to CFX.  Marland Kitchen CATARACT EXTRACTION W/PHACO Left 04/28/2016   Procedure: CATARACT EXTRACTION PHACO AND INTRAOCULAR LENS PLACEMENT (IOC);  Surgeon: Leandrew Koyanagi, MD;  Location: [facility] ORS;  Service: Ophthalmology;  Laterality: Left;  Lot# [id number] H [place]: 05:31.7 AP%: 28.2 CDE: 93.48   . COLONOSCOPY  04/13/2012   Procedure: COLONOSCOPY;  Surgeon: Inda Castle, MD;  Location: [facility] ENDOSCOPY;  Service: Endoscopy;  Laterality: N/A;  . COLONOSCOPY WITH PROPOFOL N/A 03/22/2017   Procedure: COLONOSCOPY WITH PROPOFOL;  Surgeon: Doran Stabler, MD;  Location: [facility] ENDOSCOPY;  Service: Gastroenterology;  Laterality: N/A;  . CORONARY  ANGIOPLASTY    . CORONARY STENT PLACEMENT  06/30/2010   CFX   Distal        . ESOPHAGEAL BANDING N/A 01/01/2014   Procedure: ESOPHAGEAL BANDING;  Surgeon: Jerene Bears, MD;  Location: [facility]COPY;  Service: Endoscopy;  Laterality: N/A;  . ESOPHAGOGASTRODUODENOSCOPY  04/13/2012   Procedure: ESOPHAGOGASTRODUODENOSCOPY (EGD);  Surgeon: Inda Castle, MD;  Location: [facility];  Service: Endoscopy;  Laterality: N/A;  . ESOPHAGOGASTRODUODENOSCOPY N/A 01/01/2014   Procedure: ESOPHAGOGASTRODUODENOSCOPY (EGD);   Surgeon: Jerene Bears, MD;  Location: [facility] ENDOSCOPY;  Service: Endoscopy;  Laterality: N/A;  . ESOPHAGOGASTRODUODENOSCOPY (EGD) WITH PROPOFOL N/A 07/12/2016   Procedure: ESOPHAGOGASTRODUODENOSCOPY (EGD) WITH PROPOFOL;  Surgeon: Jerene Bears, MD;  Location: [facility] ENDOSCOPY;  Service: Gastroenterology;  Laterality: N/A;  . LEFT HEART CATHETERIZATION WITH CORONARY ANGIOGRAM N/A 10/15/2014   Procedure: LEFT HEART CATHETERIZATION WITH CORONARY ANGIOGRAM;  Surgeon: Peter M Martinique, MD;  Location: [facility] CATH LAB;  Service: Cardiovascular;  Laterality: N/A;  . orif r leg    . TEE WITHOUT CARDIOVERSION N/A 04/20/2015   Procedure: TRANSESOPHAGEAL ECHOCARDIOGRAM (TEE);  Surgeon: Fay Records, MD;  Location: [place] [facility];  Service: Cardiovascular;  Laterality: N/A;  . TEE WITHOUT CARDIOVERSION N/A 05/17/2017   Procedure: TRANSESOPHAGEAL ECHOCARDIOGRAM (TEE);  Surgeon: Josue Hector, MD;  Location: [facility] ENDOSCOPY;  Service: Cardiovascular;  Laterality: N/A;    Social History   Socioeconomic History  . Marital status: Married    Spouse name: Not on file  . Number of children: 3  . Years of education: Not on file  . Highest education level: Not on file  Occupational History  . Occupation: Merchandiser, retail: OTHER    Comment: Worked in Theatre manager prior  Social Needs  . Financial resource strain: Not on file  . Food insecurity:    Worry: Not on file    Inability: Not on file  . Transportation needs:    Medical: Not on file    Non-medical: Not on file  Tobacco Use  . Smoking status: Current Every Day Smoker    Packs/day: 0.25    Years: 36.00    Pack years: 9.00    Types: E-cigarettes, Cigarettes  . Smokeless tobacco: Never Used  . Tobacco comment: uses vapor cigarettes (2016 ); I smoke about 4 cigarettes a day  Substance and Sexual Activity  . Alcohol use: No    Alcohol/week: 0.0 oz  . Drug use: No  . Sexual activity: Never  Lifestyle  . Physical activity:    Days per week: Not on file     Minutes per session: Not on file  . Stress: Not on file  Relationships  . Social connections:    Talks on phone: Not on file    Gets together: Not on file    Attends religious service: Not on file    Active member of club or organization: Not on file    Attends meetings of clubs or organizations: Not on file    Relationship status: Not on file  . Intimate partner violence:    Fear of current or ex partner: Not on file    Emotionally abused: Not on file    Physically abused: Not on file    Forced sexual activity: Not on file  Other Topics Concern  . Not on file  Social History Narrative   Married   Gets regular exercise: walking    Family History  Problem Relation Age of Onset  . Heart attack  Brother 52       MI  . Diabetes Brother   . Heart attack Father   . Diabetes Father   . COPD Father   . COPD Mother   . Heart attack Brother   . Stroke Neg Hx     Allergies  Allergen Reactions  . Isosorbide Nitrate Other (See Comments)    Nose bleeds    Medication list reviewed and updated in full in [place].  Adam: No fevers, chills. Nontoxic. Primarily MSK c/o today. MSK: Detailed in the HPI GI: tolerating PO intake without difficulty Neuro: No numbness, parasthesias, or tingling associated. Otherwise the pertinent positives of the ROS are noted above.   Objective:   BP 120/60   Pulse 83   Temp 97.8 F (36.6 C) (Oral)   Ht 5' 7"  (1.702 m)   Wt 258 lb 8 oz (117.3 kg)   BMI 40.49 kg/m    Adam: WDWN, NAD, Non-toxic, Alert & Oriented x 3. malodorous HEENT: Atraumatic, Normocephalic.  Ears and Nose: No external deformity. EXTR: No clubbing/cyanosis/edema NEURO: Normal gait.  PSYCH: Normally interactive. Conversant. Not depressed or anxious appearing.  Calm demeanor.    There is extensive bruising throughout the entirety of the foot as well as the majority of the ankle on the right.  There is also some extensive swelling on the dorsum of the foot as well as  the lateral ankle.  Nontender at the toes.  Metatarsals 2 through 5 are all tender to palpation.  There is also some tenderness throughout the midfoot.  There is also some tenderness at the talus as well as some tenderness at the ATFL, CFL, and deltoid to a lesser extent.  Tibia and fibula distally are nontender.  Radiology: Dg Ankle Complete Right  Result Date: 10/30/2017 CLINICAL DATA:  Ankle pain, fall EXAM: RIGHT ANKLE - COMPLETE 3+ VIEW COMPARISON:  None. FINDINGS: Normal alignment. No acute fracture. Locking intramedullary rod distal tibia for chronic fracture of tibia not imaged on the study. Chronic fracture of the mid fibula Diffuse soft tissue swelling. Mild calcification in the Achilles tendon insertion. IMPRESSION: Negative for acute fracture.  Diffuse soft tissue swelling. Electronically Signed   By: Franchot Gallo M.D.   On: 10/30/2017 12:39   Dg Foot Complete Right  Result Date: 10/30/2017 CLINICAL DATA:  Acute foot pain.  Fall EXAM: RIGHT FOOT COMPLETE - 3+ VIEW COMPARISON:  None. FINDINGS: Negative for fracture. No degenerative change or erosion. Diffuse soft tissue swelling of the foot and ankle. Right fixation of chronic tibial fracture IMPRESSION: Negative right foot Electronically Signed   By: Franchot Gallo M.D.   On: 10/30/2017 12:38     Assessment and Plan:   Acute foot pain, right - Plan: DG Foot Complete Right, DG Ankle Complete Right  Acute right ankle pain - Plan: DG Foot Complete Right, DG Ankle Complete Right  Elevated INR  Liver cirrhosis secondary to NASH [facility])  Extensive soft tissue damage without clear evidence of bony injury.  I suspect that the patient's liver disease and elevated INR likely make his foot appear to be worse with the extensive bruising as well as swelling.  Recommended to him that he elevate his foot periodically, ice his foot, and continue to use his crutch. I do not think that we would have any supportive brace that would adequately fit  his foot.  Follow-up: prn only  Orders Placed This Encounter  Procedures  . DG Foot Complete Right  . DG  Ankle Complete Right    Signed,  Frederico Hamman T. Graciela Plato, MD   Allergies as of 10/30/2017      Reactions   Isosorbide Nitrate Other (See Comments)   Nose bleeds      Medication List        Accurate as of 10/30/17 11:59 PM. Always use your most recent med list.          aspirin EC 81 MG tablet Take 81 mg by mouth daily.   atorvastatin 10 MG tablet Commonly known as:  LIPITOR Take 10 mg by mouth daily.   cyanocobalamin 1000 MCG tablet Take 1,000 mcg by mouth daily. Vitamin B12   fluticasone 50 MCG/ACT nasal spray Commonly known as:  FLONASE Place 2 sprays into both nostrils daily as needed for allergies or rhinitis.   furosemide 20 MG tablet Commonly known as:  LASIX TAKE 1 TABLET BY MOUTH ONCE DAILY   insulin regular 100 units/mL injection Commonly known as:  NOVOLIN R,HUMULIN R Inject 2-8 Units into the skin 3 (three) times daily as needed for high blood sugar (CBG >150).   lactulose 10 GM/15ML solution Commonly known as:  CHRONULAC Take 30 mLs (20 g total) by mouth at bedtime.   levofloxacin 500 MG tablet Commonly known as:  LEVAQUIN   multivitamin with minerals Tabs tablet Take 1 tablet by mouth daily.   nitroGLYCERIN 0.4 MG SL tablet Commonly known as:  NITROSTAT Place 1 tablet (0.4 mg total) under the tongue every 5 (five) minutes as needed for chest pain (chest pain).   PARoxetine 10 MG tablet Commonly known as:  PAXIL TAKE 1 TABLET BY MOUTH ONCE DAILY   Potassium 99 MG Tabs Take 99 mg by mouth daily.   ranitidine 150 MG tablet Commonly known as:  ZANTAC Take 150 mg by mouth 2 (two) times daily as needed for heartburn.   spironolactone 25 MG tablet Commonly known as:  ALDACTONE Take 1 tablet (25 mg total) by mouth daily.

## 2017-11-02 ENCOUNTER — Encounter (HOSPITAL_COMMUNITY): Payer: Self-pay

## 2017-11-02 ENCOUNTER — Emergency Department (HOSPITAL_COMMUNITY): Payer: PPO

## 2017-11-02 ENCOUNTER — Other Ambulatory Visit: Payer: Self-pay

## 2017-11-02 ENCOUNTER — Inpatient Hospital Stay (HOSPITAL_COMMUNITY)
Admission: EM | Admit: 2017-11-02 | Discharge: 2017-11-04 | DRG: 442 | Disposition: A | Discharge status: Home / Self Care | Payer: PPO | Attending: Internal Medicine | Admitting: Internal Medicine

## 2017-11-02 DIAGNOSIS — K219 Gastro-esophageal reflux disease without esophagitis: Secondary | ICD-10-CM | POA: Diagnosis present

## 2017-11-02 DIAGNOSIS — Z794 Long term (current) use of insulin: Secondary | ICD-10-CM

## 2017-11-02 DIAGNOSIS — Z7982 Long term (current) use of aspirin: Secondary | ICD-10-CM | POA: Diagnosis not present

## 2017-11-02 DIAGNOSIS — Z79899 Other long term (current) drug therapy: Secondary | ICD-10-CM | POA: Diagnosis not present

## 2017-11-02 DIAGNOSIS — Z955 Presence of coronary angioplasty implant and graft: Secondary | ICD-10-CM | POA: Diagnosis not present

## 2017-11-02 DIAGNOSIS — K7581 Nonalcoholic steatohepatitis (NASH): Secondary | ICD-10-CM | POA: Diagnosis not present

## 2017-11-02 DIAGNOSIS — I252 Old myocardial infarction: Secondary | ICD-10-CM

## 2017-11-02 DIAGNOSIS — Q211 Atrial septal defect: Secondary | ICD-10-CM

## 2017-11-02 DIAGNOSIS — E785 Hyperlipidemia, unspecified: Secondary | ICD-10-CM | POA: Diagnosis present

## 2017-11-02 DIAGNOSIS — R4182 Altered mental status, unspecified: Secondary | ICD-10-CM | POA: Diagnosis not present

## 2017-11-02 DIAGNOSIS — Z888 Allergy status to other drugs, medicaments and biological substances status: Secondary | ICD-10-CM | POA: Diagnosis not present

## 2017-11-02 DIAGNOSIS — F17210 Nicotine dependence, cigarettes, uncomplicated: Secondary | ICD-10-CM | POA: Diagnosis not present

## 2017-11-02 DIAGNOSIS — R402441 Other coma, without documented Glasgow coma scale score, or with partial score reported, in the field [EMT or ambulance]: Secondary | ICD-10-CM | POA: Diagnosis not present

## 2017-11-02 DIAGNOSIS — E119 Type 2 diabetes mellitus without complications: Secondary | ICD-10-CM | POA: Diagnosis not present

## 2017-11-02 DIAGNOSIS — I5032 Chronic diastolic (congestive) heart failure: Secondary | ICD-10-CM | POA: Diagnosis not present

## 2017-11-02 DIAGNOSIS — R4020 Unspecified coma: Secondary | ICD-10-CM | POA: Diagnosis not present

## 2017-11-02 DIAGNOSIS — I712 Thoracic aortic aneurysm, without rupture: Secondary | ICD-10-CM | POA: Diagnosis present

## 2017-11-02 DIAGNOSIS — K7469 Other cirrhosis of liver: Secondary | ICD-10-CM | POA: Diagnosis not present

## 2017-11-02 DIAGNOSIS — I7121 Aneurysm of the ascending aorta, without rupture: Secondary | ICD-10-CM | POA: Diagnosis present

## 2017-11-02 DIAGNOSIS — K7682 Hepatic encephalopathy: Secondary | ICD-10-CM

## 2017-11-02 DIAGNOSIS — I110 Hypertensive heart disease with heart failure: Secondary | ICD-10-CM | POA: Diagnosis present

## 2017-11-02 DIAGNOSIS — I2510 Atherosclerotic heart disease of native coronary artery without angina pectoris: Secondary | ICD-10-CM | POA: Diagnosis present

## 2017-11-02 DIAGNOSIS — K7460 Unspecified cirrhosis of liver: Secondary | ICD-10-CM | POA: Diagnosis present

## 2017-11-02 DIAGNOSIS — Z720 Tobacco use: Secondary | ICD-10-CM | POA: Diagnosis present

## 2017-11-02 DIAGNOSIS — M1990 Unspecified osteoarthritis, unspecified site: Secondary | ICD-10-CM | POA: Diagnosis present

## 2017-11-02 DIAGNOSIS — E876 Hypokalemia: Secondary | ICD-10-CM | POA: Diagnosis present

## 2017-11-02 DIAGNOSIS — D696 Thrombocytopenia, unspecified: Secondary | ICD-10-CM | POA: Diagnosis present

## 2017-11-02 DIAGNOSIS — I8500 Esophageal varices without bleeding: Secondary | ICD-10-CM | POA: Diagnosis not present

## 2017-11-02 DIAGNOSIS — W57XXXA Bitten or stung by nonvenomous insect and other nonvenomous arthropods, initial encounter: Secondary | ICD-10-CM

## 2017-11-02 DIAGNOSIS — Z9181 History of falling: Secondary | ICD-10-CM | POA: Diagnosis not present

## 2017-11-02 DIAGNOSIS — K7290 Hepatic failure, unspecified without coma: Secondary | ICD-10-CM | POA: Diagnosis not present

## 2017-11-02 LAB — I-STAT CG4 LACTIC ACID, ED: Lactic Acid, Venous: 1.32 mmol/L (ref 0.5–1.9)

## 2017-11-02 LAB — URINALYSIS, ROUTINE W REFLEX MICROSCOPIC
Bilirubin Urine: NEGATIVE
GLUCOSE, UA: NEGATIVE mg/dL
Ketones, ur: NEGATIVE mg/dL
Leukocytes, UA: NEGATIVE
Nitrite: NEGATIVE
PH: 7 (ref 5.0–8.0)
Protein, ur: NEGATIVE mg/dL
Specific Gravity, Urine: 1.009 (ref 1.005–1.030)

## 2017-11-02 LAB — GLUCOSE, CAPILLARY: Glucose-Capillary: 268 mg/dL — ABNORMAL HIGH (ref 65–99)

## 2017-11-02 LAB — COMPREHENSIVE METABOLIC PANEL
ALK PHOS: 217 U/L — AB (ref 38–126)
ALT: 36 U/L (ref 17–63)
AST: 66 U/L — AB (ref 15–41)
Albumin: 2.1 g/dL — ABNORMAL LOW (ref 3.5–5.0)
Anion gap: 6 (ref 5–15)
BILIRUBIN TOTAL: 2.5 mg/dL — AB (ref 0.3–1.2)
BUN: 8 mg/dL (ref 6–20)
CALCIUM: 7.4 mg/dL — AB (ref 8.9–10.3)
CO2: 25 mmol/L (ref 22–32)
CREATININE: 0.79 mg/dL (ref 0.61–1.24)
Chloride: 106 mmol/L (ref 101–111)
GFR calc Af Amer: >60 mL/min (ref >60)
Glucose, Bld: 179 mg/dL — ABNORMAL HIGH (ref 65–99)
Potassium: 2.8 mmol/L — ABNORMAL LOW (ref 3.5–5.1)
Sodium: 137 mmol/L (ref 135–145)
TOTAL PROTEIN: 6 g/dL — AB (ref 6.5–8.1)

## 2017-11-02 LAB — CBG MONITORING, ED
GLUCOSE-CAPILLARY: 159 mg/dL — AB (ref 65–99)
Glucose-Capillary: 157 mg/dL — ABNORMAL HIGH (ref 65–99)
Glucose-Capillary: 127 mg/dL — ABNORMAL HIGH (ref 65–99)

## 2017-11-02 LAB — RAPID URINE DRUG SCREEN, HOSP PERFORMED
AMPHETAMINES: NOT DETECTED
BARBITURATES: NOT DETECTED
BENZODIAZEPINES: NOT DETECTED
COCAINE: NOT DETECTED
Opiates: NOT DETECTED
Tetrahydrocannabinol: NOT DETECTED

## 2017-11-02 LAB — CBC WITH DIFFERENTIAL/PLATELET
BASOS ABS: 0 10*3/uL (ref 0.0–0.1)
Basophils Relative: 1 %
Eosinophils Absolute: 0.1 10*3/uL (ref 0.0–0.7)
Eosinophils Relative: 3 %
HEMATOCRIT: 34.2 % — AB (ref 39.0–52.0)
Hemoglobin: 11.9 g/dL — ABNORMAL LOW (ref 13.0–17.0)
LYMPHS PCT: 18 %
Lymphs Abs: 0.8 10*3/uL (ref 0.7–4.0)
MCH: 33.3 pg (ref 26.0–34.0)
MCHC: 34.8 g/dL (ref 30.0–36.0)
MCV: 95.8 fL (ref 78.0–100.0)
Monocytes Absolute: 0.7 10*3/uL (ref 0.1–1.0)
Monocytes Relative: 16 %
NEUTROS ABS: 2.6 10*3/uL (ref 1.7–7.7)
Neutrophils Relative %: 62 %
Platelets: 32 10*3/uL — ABNORMAL LOW (ref 150–400)
RBC: 3.57 MIL/uL — AB (ref 4.22–5.81)
RDW: 16.4 % — AB (ref 11.5–15.5)
WBC: 4.2 10*3/uL (ref 4.0–10.5)

## 2017-11-02 LAB — PROTIME-INR
INR: 1.71
PROTHROMBIN TIME: 20 s — AB (ref 11.4–15.2)

## 2017-11-02 LAB — HEMOGLOBIN A1C
Hgb A1c MFr Bld: 8.1 % — ABNORMAL HIGH (ref 4.8–5.6)
Mean Plasma Glucose: 185.77 mg/dL

## 2017-11-02 LAB — I-STAT TROPONIN, ED: Troponin i, poc: 0 ng/mL (ref 0.00–0.08)

## 2017-11-02 LAB — AMMONIA: AMMONIA: 108 umol/L — AB (ref 9–35)

## 2017-11-02 MED ORDER — INSULIN ASPART 100 UNIT/ML ~~LOC~~ SOLN
0.0000 [IU] | Freq: Three times a day (TID) | SUBCUTANEOUS | Status: DC
  Administered 2017-11-02: 1 [IU] via SUBCUTANEOUS
  Administered 2017-11-02 (×2): 2 [IU] via SUBCUTANEOUS
  Administered 2017-11-03: 3 [IU] via SUBCUTANEOUS
  Administered 2017-11-03: 2 [IU] via SUBCUTANEOUS
  Administered 2017-11-03 – 2017-11-04 (×2): 1 [IU] via SUBCUTANEOUS
  Filled 2017-11-02 (×2): qty 1

## 2017-11-02 MED ORDER — ONDANSETRON HCL 4 MG/2ML IJ SOLN: 4.0000 mg | Freq: Four times a day (QID) | INTRAMUSCULAR | Status: DC | PRN

## 2017-11-02 MED ORDER — LACTULOSE 10 GM/15ML PO SOLN
30.0000 g | Freq: Two times a day (BID) | ORAL | Status: DC
  Administered 2017-11-02 – 2017-11-04 (×4): 30 g via ORAL
  Filled 2017-11-02 (×2): qty 60
  Filled 2017-11-02: qty 45
  Filled 2017-11-02 (×2): qty 60

## 2017-11-02 MED ORDER — POTASSIUM CHLORIDE 10 MEQ/100ML IV SOLN
10.0000 meq | INTRAVENOUS | Status: AC
  Administered 2017-11-02 (×2): 10 meq via INTRAVENOUS
  Filled 2017-11-02 (×3): qty 100

## 2017-11-02 MED ORDER — LACTULOSE 10 GM/15ML PO SOLN
30.0000 g | Freq: Three times a day (TID) | ORAL | Status: DC
  Administered 2017-11-02: 30 g via ORAL
  Filled 2017-11-02 (×2): qty 45

## 2017-11-02 MED ORDER — LACTULOSE 10 GM/15ML PO SOLN: 20.0000 g | Freq: Two times a day (BID) | ORAL | Status: DC | PRN

## 2017-11-02 MED ORDER — MAGNESIUM SULFATE 2 GM/50ML IV SOLN
2.0000 g | Freq: Once | INTRAVENOUS | Status: AC
  Administered 2017-11-02: 2 g via INTRAVENOUS
  Filled 2017-11-02: qty 50

## 2017-11-02 MED ORDER — LEVOFLOXACIN 500 MG PO TABS
500.0000 mg | ORAL_TABLET | Freq: Every day | ORAL | Status: DC
  Administered 2017-11-02 – 2017-11-04 (×3): 500 mg via ORAL
  Filled 2017-11-02 (×4): qty 1

## 2017-11-02 MED ORDER — SPIRONOLACTONE 25 MG PO TABS
25.0000 mg | ORAL_TABLET | Freq: Every day | ORAL | Status: DC
  Administered 2017-11-02 – 2017-11-04 (×3): 25 mg via ORAL
  Filled 2017-11-02 (×4): qty 1

## 2017-11-02 MED ORDER — POTASSIUM CHLORIDE 10 MEQ/100ML IV SOLN
10.0000 meq | INTRAVENOUS | Status: AC
  Administered 2017-11-02 (×2): 10 meq via INTRAVENOUS
  Filled 2017-11-02: qty 100

## 2017-11-02 MED ORDER — VITAMIN B-12 1000 MCG PO TABS
1000.0000 ug | ORAL_TABLET | Freq: Every day | ORAL | Status: DC
  Administered 2017-11-02 – 2017-11-04 (×3): 1000 ug via ORAL
  Filled 2017-11-02 (×4): qty 1

## 2017-11-02 MED ORDER — FLUTICASONE PROPIONATE 50 MCG/ACT NA SUSP: 2.0000 | Freq: Every day | NASAL | Status: DC | PRN

## 2017-11-02 MED ORDER — POTASSIUM CHLORIDE 10 MEQ/100ML IV SOLN
INTRAVENOUS | Status: AC
  Administered 2017-11-02: 10 meq via INTRAVENOUS
  Filled 2017-11-02: qty 100

## 2017-11-02 MED ORDER — INSULIN ASPART 100 UNIT/ML ~~LOC~~ SOLN
0.0000 [IU] | Freq: Every day | SUBCUTANEOUS | Status: DC
  Administered 2017-11-02: 3 [IU] via SUBCUTANEOUS

## 2017-11-02 MED ORDER — LACTULOSE ENEMA
300.0000 mL | Freq: Once | ORAL | Status: DC
  Filled 2017-11-02: qty 300

## 2017-11-02 MED ORDER — PAROXETINE HCL 10 MG PO TABS
10.0000 mg | ORAL_TABLET | Freq: Every day | ORAL | Status: DC
  Administered 2017-11-02 – 2017-11-04 (×3): 10 mg via ORAL
  Filled 2017-11-02 (×3): qty 1

## 2017-11-02 MED ORDER — ATORVASTATIN CALCIUM 10 MG PO TABS
10.0000 mg | ORAL_TABLET | Freq: Every day | ORAL | Status: DC
  Administered 2017-11-02 – 2017-11-04 (×3): 10 mg via ORAL
  Filled 2017-11-02 (×4): qty 1

## 2017-11-02 MED ORDER — ONDANSETRON HCL 4 MG PO TABS: 4.0000 mg | ORAL_TABLET | Freq: Four times a day (QID) | ORAL | Status: DC | PRN

## 2017-11-02 MED ORDER — POTASSIUM 99 MG PO TABS: 99.0000 mg | ORAL_TABLET | Freq: Every day | ORAL | Status: DC

## 2017-11-02 NOTE — ED Notes (Signed)
Admitting at the bedside.  

## 2017-11-02 NOTE — ED Notes (Signed)
Spoke w/ Larkin Ina in [employer] for an update on an alternative for the request on a rectal catheter. He doesn't have an update for me. He stated that none of the directors or supervisors know what else to do in replace of the rectal catheter.

## 2017-11-02 NOTE — ED Notes (Signed)
Called Larkin Ina w/ [employer] back, and made him aware of the pt's condition and why the pt needs a rectal catheter. Asked if Larkin Ina could find an alternative since the rectal catheters are unavailable. He stated that he will call his supervisor Danae Chen and then call this NS back.

## 2017-11-02 NOTE — ED Notes (Signed)
Rn consulted Pharmacy. Pt is supposed to receive 4 runs of K+. Pt only received 2 in ED. 2 more runs of K+ being rescheduled by Pharmacy.

## 2017-11-02 NOTE — ED Notes (Addendum)
Adam Butler w/ [employer] called and stated that he had received an A11 for a 'rectal catheter' and that they do not have rectal catheters due to them being on back order. RN Rod Holler made aware.

## 2017-11-02 NOTE — ED Notes (Signed)
Pt's wife reports the pt has been altered for the past 2 days. Wife reports she felt as though something was going to happen and then this did.

## 2017-11-02 NOTE — ED Provider Notes (Signed)
[facility] EMERGENCY DEPARTMENT Provider Note   CSN: [id number] Arrival date & time: 11/02/17  0348     History   Chief Complaint Chief Complaint  Patient presents with  . Altered LOC    HPI Adam Butler is a 62 y.o. male.  Patient presents to the emergency department for evaluation of mental status changes.  Patient has a history of cryptogenic cirrhosis.  Wife reports that she noticed yesterday evening that he seemed a little confused.  Tonight she found him in the bathroom standing in front of the sink.  He reported that he needed to urinate but seemed confused and unsure how to use the toilet.  Wife reports that she is concerned that he might have elevated ammonia, as his current symptoms are similar to when he has had hepatic encephalopathy.  He has not had any recent illness.  He is on lactulose, no dose change.  He did have a fall 1 week ago.  He injured his foot, but saw his primary doctor and had an x-ray which was negative.  There was not any significant history of head injury with the fall.     Past Medical History:  Diagnosis Date  . Arthritis   . Cholelithiasis   . Cirrhosis ([place]) 2011   Cryptogenic, Likely NASH. Family/pt deny EtOH. HCV, HBV, HAV negative. ANA negative. AMA positive. Ascites 12/11  . Coronary artery disease    Inferior MI 12/11; LHC with occluded mid CFX and 80% proximal RCA. EF 55%. He had 3.0 x 28 vision BMS to CFX  . Diastolic CHF, acute ([place])    Echo 12/11 with ef 50-55% and mild LVH. EF 55% by LV0gram in 12/11  . Esophageal varices ([place]) 2011, 2013   no hx acute variceal bleed  . Hepatic encephalopathy ([place]) 2011, 12/2013  . Hyperlipemia   . Myocardial infarction [facility]) ? 2012  . PFO (patent foramen ovale): Per TEE 04/20/2015 04/20/2015  . Portal hypertension ([place])   . Rectal varices   . S/P coronary artery stent placement 06/2010  . SVT (supraventricular tachycardia) ([place])    1/12: appeared to be an ectopic atrial  tachycardia. Required DCCV with hemodynamic instability  . Type II diabetes mellitus ([place]) 2011    Patient Active Problem List   Diagnosis Date Noted  . Chronic diastolic CHF (congestive heart failure) ([place])   . Thoracic ascending aortic aneurysm ([place])   . OA (osteoarthritis) of knee 07/19/2016  . GERD (gastroesophageal reflux disease) 09/29/2015  . Type 2 diabetes mellitus ([place]) 08/18/2015  . CAD in native artery   . Liver cirrhosis secondary to NASH ([place]) 06/06/2015  . Tobacco abuse 05/04/2015  . PFO (patent foramen ovale): Per TEE 04/20/2015 04/20/2015  . Esophageal varices without bleeding ([place]) 08/16/2010    Past Surgical History:  Procedure Laterality Date  . APPENDECTOMY    . CARDIAC CATHETERIZATION  07/01/2010   BMS to CFX.  Marland Kitchen CATARACT EXTRACTION W/PHACO Left 04/28/2016   Procedure: CATARACT EXTRACTION PHACO AND INTRAOCULAR LENS PLACEMENT (IOC);  Surgeon: Leandrew Koyanagi, MD;  Location: [facility] ORS;  Service: Ophthalmology;  Laterality: Left;  Lot# [id number] H [place]: 05:31.7 AP%: 28.2 CDE: 93.48   . COLONOSCOPY  04/13/2012   Procedure: COLONOSCOPY;  Surgeon: Inda Castle, MD;  Location: [facility] ENDOSCOPY;  Service: Endoscopy;  Laterality: N/A;  . COLONOSCOPY WITH PROPOFOL N/A 03/22/2017   Procedure: COLONOSCOPY WITH PROPOFOL;  Surgeon: Doran Stabler, MD;  Location: [facility] ENDOSCOPY;  Service: Gastroenterology;  Laterality: N/A;  .  CORONARY ANGIOPLASTY    . CORONARY STENT PLACEMENT  06/30/2010   CFX   Distal        . ESOPHAGEAL BANDING N/A 01/01/2014   Procedure: ESOPHAGEAL BANDING;  Surgeon: Jerene Bears, MD;  Location: [facility] ENDOSCOPY;  Service: Endoscopy;  Laterality: N/A;  . ESOPHAGOGASTRODUODENOSCOPY  04/13/2012   Procedure: ESOPHAGOGASTRODUODENOSCOPY (EGD);  Surgeon: Inda Castle, MD;  Location: [facility];  Service: Endoscopy;  Laterality: N/A;  . ESOPHAGOGASTRODUODENOSCOPY N/A 01/01/2014   Procedure: ESOPHAGOGASTRODUODENOSCOPY (EGD);  Surgeon: Jerene Bears, MD;   Location: [facility] ENDOSCOPY;  Service: Endoscopy;  Laterality: N/A;  . ESOPHAGOGASTRODUODENOSCOPY (EGD) WITH PROPOFOL N/A 07/12/2016   Procedure: ESOPHAGOGASTRODUODENOSCOPY (EGD) WITH PROPOFOL;  Surgeon: Jerene Bears, MD;  Location: [facility] ENDOSCOPY;  Service: Gastroenterology;  Laterality: N/A;  . LEFT HEART CATHETERIZATION WITH CORONARY ANGIOGRAM N/A 10/15/2014   Procedure: LEFT HEART CATHETERIZATION WITH CORONARY ANGIOGRAM;  Surgeon: Peter M Martinique, MD;  Location: [facility] CATH LAB;  Service: Cardiovascular;  Laterality: N/A;  . orif r leg    . TEE WITHOUT CARDIOVERSION N/A 04/20/2015   Procedure: TRANSESOPHAGEAL ECHOCARDIOGRAM (TEE);  Surgeon: Fay Records, MD;  Location: [facility];  Service: Cardiovascular;  Laterality: N/A;  . TEE WITHOUT CARDIOVERSION N/A 05/17/2017   Procedure: TRANSESOPHAGEAL ECHOCARDIOGRAM (TEE);  Surgeon: Josue Hector, MD;  Location: [facility] ENDOSCOPY;  Service: Cardiovascular;  Laterality: N/A;        Home Medications    Prior to Admission medications   Medication Sig Start Date End Date Taking? Authorizing Provider  aspirin EC 81 MG tablet Take 81 mg by mouth daily.   Yes [provider]  atorvastatin (LIPITOR) 10 MG tablet Take 10 mg by mouth daily.   Yes [provider]  cyanocobalamin 1000 MCG tablet Take 1,000 mcg by mouth daily. Vitamin B12   Yes [provider]  fluticasone (FLONASE) 50 MCG/ACT nasal spray Place 2 sprays into both nostrils daily as needed for allergies or rhinitis.    Yes [provider]  furosemide (LASIX) 20 MG tablet TAKE 1 TABLET BY MOUTH ONCE DAILY 10/09/17  Yes Pyrtle, Lajuan Lines, MD  insulin regular (NOVOLIN R,HUMULIN R) 100 units/mL injection Inject 2-8 Units into the skin 3 (three) times daily as needed for high blood sugar (CBG >150).    Yes [provider]  lactulose (CHRONULAC) 10 GM/15ML solution Take 30 mLs (20 g total) by mouth at bedtime. 06/30/17  Yes Pyrtle, Lajuan Lines, MD  levofloxacin (LEVAQUIN) 500 MG  tablet Take 500 mg by mouth daily.  10/24/17  Yes [provider]  Multiple Vitamin (MULTIVITAMIN WITH MINERALS) TABS tablet Take 1 tablet by mouth daily.   Yes [provider]  nitroGLYCERIN (NITROSTAT) 0.4 MG SL tablet Place 1 tablet (0.4 mg total) under the tongue every 5 (five) minutes as needed for chest pain (chest pain). 07/19/16  Yes [name], NP  PARoxetine (PAXIL) 10 MG tablet TAKE 1 TABLET BY MOUTH ONCE DAILY 10/09/17  Yes [name], NP  Potassium 99 MG TABS Take 99 mg by mouth daily.   Yes [provider]  ranitidine (ZANTAC) 150 MG tablet Take 150 mg by mouth 2 (two) times daily as needed for heartburn.    Yes [provider]  spironolactone (ALDACTONE) 25 MG tablet Take 1 tablet (25 mg total) by mouth daily. 07/28/17  Yes Pyrtle, Lajuan Lines, MD    Family History Family History  Problem Relation Age of Onset  . Heart attack Brother 58  MI  . Diabetes Brother   . Heart attack Father   . Diabetes Father   . COPD Father   . COPD Mother   . Heart attack Brother   . Stroke Neg Hx     Social History Social History   Tobacco Use  . Smoking status: Current Every Day Smoker    Packs/day: 0.25    Years: 36.00    Pack years: 9.00    Types: E-cigarettes, Cigarettes  . Smokeless tobacco: Never Used  . Tobacco comment: uses vapor cigarettes (2016 ); I smoke about 4 cigarettes a day  Substance Use Topics  . Alcohol use: No    Alcohol/week: 0.0 oz  . Drug use: No     Allergies   Isosorbide nitrate   Review of Systems Review of Systems  Psychiatric/Behavioral: Positive for confusion.  All other systems reviewed and are negative.    Physical Exam Updated Vital Signs BP (!) 131/59   Pulse 69   Temp 98.2 F (36.8 C)   Resp (!) 22   SpO2 96%   Physical Exam  Constitutional: He appears well-developed and well-nourished. No distress.  HENT:  Head: Normocephalic and atraumatic.  Right Ear: Hearing normal.  Left Ear:  Hearing normal.  Nose: Nose normal.  Mouth/Throat: Oropharynx is clear and moist and mucous membranes are normal.  Eyes: Pupils are equal, round, and reactive to light. Conjunctivae and EOM are normal.  Neck: Normal range of motion. Neck supple.  Cardiovascular: Regular rhythm, S1 normal and S2 normal. Exam reveals no gallop and no friction rub.  No murmur heard. Pulmonary/Chest: Effort normal and breath sounds normal. No respiratory distress. He exhibits no tenderness.  Abdominal: Soft. Normal appearance and bowel sounds are normal. There is no hepatosplenomegaly. There is no tenderness. There is no rebound, no guarding, no tenderness at McBurney's point and negative Murphy's sign. No hernia.  Musculoskeletal: Normal range of motion.  Neurological: He is alert. He has normal strength. He is disoriented. No cranial nerve deficit or sensory deficit. Coordination normal. GCS eye subscore is 4. GCS verbal subscore is 5. GCS motor subscore is 6.  Somnolent, awakens to voice.  Skin: Skin is warm, dry and intact. No rash noted. No cyanosis.  Psychiatric: He has a normal mood and affect. His speech is normal and behavior is normal. Thought content normal.  Nursing note and vitals reviewed.    ED Treatments / Results  Labs (all labs ordered are listed, but only abnormal results are displayed) Labs Reviewed  CBC WITH DIFFERENTIAL/PLATELET - Abnormal; Notable for the following components:      Result Value   RBC 3.57 (*)    Hemoglobin 11.9 (*)    HCT 34.2 (*)    RDW 16.4 (*)    Platelets 32 (*)    All other components within normal limits  COMPREHENSIVE METABOLIC PANEL - Abnormal; Notable for the following components:   Potassium 2.8 (*)    Glucose, Bld 179 (*)    Calcium 7.4 (*)    Total Protein 6.0 (*)    Albumin 2.1 (*)    AST 66 (*)    Alkaline Phosphatase 217 (*)    Total Bilirubin 2.5 (*)    All other components within normal limits  AMMONIA - Abnormal; Notable for the following  components:   Ammonia 108 (*)    All other components within normal limits  PROTIME-INR - Abnormal; Notable for the following components:   Prothrombin Time 20.0 (*)  All other components within normal limits  URINALYSIS, ROUTINE W REFLEX MICROSCOPIC  RAPID URINE DRUG SCREEN, HOSP PERFORMED  I-STAT CG4 LACTIC ACID, ED  I-STAT TROPONIN, ED    EKG EKG Interpretation  Date/Time:  Thursday November 02 2017 04:11:55 EDT Ventricular Rate:  70 PR Interval:    QRS Duration: 113 QT Interval:  539 QTC Calculation: 582 R Axis:   55 Text Interpretation:  Sinus or ectopic atrial rhythm Short PR interval Inferior infarct, old Abnormal lateral Q waves Prolonged QT interval No significant change since last tracing Confirmed by Orpah Greek [phone]) on 11/02/2017 4:23:23 AM   Radiology Ct Head Wo Contrast  Result Date: 11/02/2017 CLINICAL DATA:  Altered level of consciousness. History of cirrhosis, diabetes, hyperlipidemia. EXAM: CT HEAD WITHOUT CONTRAST TECHNIQUE: Contiguous axial images were obtained from the base of the skull through the vertex without intravenous contrast. COMPARISON:  CT HEAD April 27, 2017 FINDINGS: BRAIN: No intraparenchymal hemorrhage, mass effect nor midline shift. Mild parenchymal brain volume loss. No hydrocephalus. Patchy supratentorial white matter hypodensities. 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 No acute large vascular territory infarcts. No abnormal extra-axial fluid collections. Basal cisterns are patent. VASCULAR: Moderate calcific atherosclerosis of the carotid siphons. SKULL: No skull fracture. No significant scalp soft tissue swelling. SINUSES/ORBITS: Atretic RIGHT maxillary sinus seen with chronic sinusitis. Mild sphenoid no ethmoidal sinusitis. Mastoid air cells are well aerated. Included ocular globes and orbital contents are non-suspicious. Status post bilateral ocular lens implants. OTHER: Poor dentition. IMPRESSION: 1. No acute intracranial  process. 2. 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 3. Stable mild parenchymal brain volume loss for age. 4. Stable moderate chronic small vessel ischemic disease. Electronically Signed   By: Elon Alas M.D.   On: 11/02/2017 05:00   Dg Chest Port 1 View  Result Date: 11/02/2017 CLINICAL DATA:  Altered mental status. EXAM: PORTABLE CHEST 1 VIEW COMPARISON:  Chest radiograph June 30, 2017 FINDINGS: Cardiomediastinal silhouette is unremarkable for this low inspiratory examination with crowded vasculature markings. Mildly calcified aortic arch. Bronchitic changes. The lungs are clear without pleural effusions or focal consolidations. Trachea projects midline and there is no pneumothorax. Included soft tissue planes and osseous structures are non-suspicious. Old LEFT clavicle fracture. Old LEFT rib fracture. IMPRESSION: Mild bronchitic changes without focal consolidation. Aortic Atherosclerosis (ICD10-I70.0). Electronically Signed   By: Elon Alas M.D.   On: 11/02/2017 04:14    Procedures Procedures (including critical care time)  Medications Ordered in ED Medications - No data to display   Initial Impression / Assessment and Plan / ED Course  I have reviewed the triage vital signs and the nursing notes.  Pertinent labs & imaging results that were available during my care of the patient were reviewed by me and considered in my medical decision making (see chart for details).     Patient presents to the emergency department with altered mental status.  Patient has a history of cirrhosis.  He has had similar episodes with hepatic encephalopathy.  Patient's wife is concerned that his ammonia might be elevated.  He did have a fall last week.  He did well after the fall, however.  There was no evidence of any significant head injury.  Head CT does not show an abnormality.  Chest x-ray does not suggest infection.  Blood work reveals hypokalemia.  Patient does have marked  elevation of his ammonia at 108.  Due to significant alteration of his mental status, patient is not a candidate for oral lactulose due  to aspiration risk.  Will require hospitalization.  Addendum: Upon reevaluation of the patient to determine if he was awake and of her lactulose, I did find 2 ticks on him.  These were removed with forceps.  There is no surrounding erythema or rash.  Neither appeared particularly engorged, does not have a fever.  Recommend observation of the area, no empiric antibiotics at this time.  Final Clinical Impressions(s) / ED Diagnoses   Final diagnoses:  Hepatic encephalopathy [facility])  Hypokalemia    ED Discharge Orders    None       Orpah Greek, MD 11/02/17 [phone]

## 2017-11-02 NOTE — ED Notes (Signed)
Patient transported to CT 

## 2017-11-02 NOTE — ED Triage Notes (Signed)
Patient arrived with EMS from home , family reports lethargy /altered LOC onset yesterday , they suspects elevated ammonia , alert but somnolent at arrival . CBG 192 by EMS .

## 2017-11-02 NOTE — Plan of Care (Signed)
Adam Butler is a 62 y/o M with pmh of nonalcoholic cirrhosis; who present with AMS. Pt had fall last week CT head neg. Pt incidentally noted to have ticks on him that were picked off.  No reported fevers.  Patient is lethargic and ammonia level was 108 with potassium 2.8.  Dr. Betsey Holiday to order lactulose either orally or rectally and potassium.  Will need to assess in a.m. for bed placement.

## 2017-11-02 NOTE — ED Notes (Signed)
Admitting MD paged to [facility] @ [phone].

## 2017-11-02 NOTE — Progress Notes (Signed)
Patient arrived to unit AOx4. MD notified. Family notified

## 2017-11-02 NOTE — ED Notes (Signed)
Delay in [facility] consulted.
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
## 2017-11-02 NOTE — ED Notes (Signed)
Heart Healthy/ Carb Modified Diet was ordered for Lunch.

## 2017-11-03 DIAGNOSIS — K7290 Hepatic failure, unspecified without coma: Principal | ICD-10-CM

## 2017-11-03 LAB — CBC
HCT: 31.8 % — ABNORMAL LOW (ref 39.0–52.0)
Hemoglobin: 10.8 g/dL — ABNORMAL LOW (ref 13.0–17.0)
MCH: 32.8 pg (ref 26.0–34.0)
MCHC: 34 g/dL (ref 30.0–36.0)
MCV: 96.7 fL (ref 78.0–100.0)
Platelets: 35 K/uL — ABNORMAL LOW (ref 150–400)
RBC: 3.29 MIL/uL — ABNORMAL LOW (ref 4.22–5.81)
RDW: 16.6 % — ABNORMAL HIGH (ref 11.5–15.5)
WBC: 4.7 K/uL (ref 4.0–10.5)

## 2017-11-03 LAB — COMPREHENSIVE METABOLIC PANEL
ALT: 33 U/L (ref 17–63)
ANION GAP: 6 (ref 5–15)
AST: 58 U/L — ABNORMAL HIGH (ref 15–41)
Albumin: 2 g/dL — ABNORMAL LOW (ref 3.5–5.0)
Alkaline Phosphatase: 133 U/L — ABNORMAL HIGH (ref 38–126)
BILIRUBIN TOTAL: 3.3 mg/dL — AB (ref 0.3–1.2)
BUN: 9 mg/dL (ref 6–20)
CALCIUM: 7.7 mg/dL — AB (ref 8.9–10.3)
CO2: 24 mmol/L (ref 22–32)
CREATININE: 0.78 mg/dL (ref 0.61–1.24)
Chloride: 107 mmol/L (ref 101–111)
GFR calc non Af Amer: >60 mL/min (ref >60)
GLUCOSE: 175 mg/dL — AB (ref 65–99)
Potassium: 3.2 mmol/L — ABNORMAL LOW (ref 3.5–5.1)
Sodium: 137 mmol/L (ref 135–145)
TOTAL PROTEIN: 5.7 g/dL — AB (ref 6.5–8.1)

## 2017-11-03 LAB — AMMONIA: Ammonia: 81 umol/L — ABNORMAL HIGH (ref 9–35)

## 2017-11-03 LAB — GLUCOSE, CAPILLARY
GLUCOSE-CAPILLARY: 144 mg/dL — AB (ref 65–99)
Glucose-Capillary: 225 mg/dL — ABNORMAL HIGH (ref 65–99)
Glucose-Capillary: 195 mg/dL — ABNORMAL HIGH (ref 65–99)
Glucose-Capillary: 189 mg/dL — ABNORMAL HIGH (ref 65–99)

## 2017-11-03 MED ORDER — POTASSIUM CHLORIDE CRYS ER 20 MEQ PO TBCR
20.0000 meq | EXTENDED_RELEASE_TABLET | Freq: Every day | ORAL | Status: DC
  Administered 2017-11-04: 20 meq via ORAL
  Filled 2017-11-03 (×2): qty 1

## 2017-11-03 MED ORDER — FUROSEMIDE 20 MG PO TABS
20.0000 mg | ORAL_TABLET | Freq: Every day | ORAL | Status: DC
  Administered 2017-11-03 – 2017-11-04 (×2): 20 mg via ORAL
  Filled 2017-11-03 (×2): qty 1

## 2017-11-03 MED ORDER — POTASSIUM CHLORIDE CRYS ER 20 MEQ PO TBCR
40.0000 meq | EXTENDED_RELEASE_TABLET | Freq: Once | ORAL | Status: AC
  Administered 2017-11-03: 40 meq via ORAL
  Filled 2017-11-03: qty 2

## 2017-11-03 NOTE — Care Management Note (Signed)
Case Management Note  Patient Details  Name: Adam Butler MRN: [id number] Date of Birth: 1955-10-04  Subjective/Objective:     Pt admitted with hepatic encephalopathy. He is from home with spouse.               Action/Plan: Plan is to return home when medically ready. MD please order PT/OT evals as needed. CM following.   Expected Discharge Date:                  Expected Discharge Plan:  Home/Self Care  In-House Referral:     Discharge planning Services     Post Acute Care Choice:    Choice offered to:     DME Arranged:    DME Agency:     HH Arranged:    HH Agency:     Status of Service:  In process, will continue to follow  If discussed at Long Length of Stay Meetings, dates discussed:    Additional Comments:  Pollie Friar, RN 11/03/2017, 2:57 PM

## 2017-11-03 NOTE — Progress Notes (Signed)
PROGRESS NOTE    Adam Butler  SHF:[id number] DOB: June 19, 1956 DOA: 11/02/2017 PCP: [name], NP   Brief Narrative: Patient is a 77 old male with past medical history of cryptogenic cirrhosis, Nash, esophageal veracious, hepatic encephalopathy, diabetes, coronary artery disease status post stenting, not associated who presented to the emergency department with altered mental status.  Patient was admitted for the management of hepatic encephalopathy.  Assessment & Plan:   Principal Problem:   Hepatic encephalopathy ([place]) Active Problems:   Esophageal varices without bleeding (HCC)   Tobacco abuse   Liver cirrhosis secondary to NASH (HCC)   Hypokalemia   Type 2 diabetes mellitus (HCC)   GERD (gastroesophageal reflux disease)   Chronic diastolic CHF (congestive heart failure) ([place])   Thoracic ascending aortic aneurysm (HCC)   Thrombocytopenia (HCC)  Hepatic encephalopathy: Currently alert and oriented x4.  Presented with ammonia level of 108.  Today ammonia level has come down to 80s . We will continue lactulose. Likely,patient was not compliant with lactulose at home. Will check ammonia level tomorrow.  He needs to have 2-3 soft stools a day. We will consider adding rifaximin if his encephalopathy reoccurs or ammonia level  does not go down.  Cirrhosis :Meld score of 16.  On his spironolactone and Lasix at home.  Follows with gastroenterology as an outpatient.  He was not on beta-blockers.  On Levaquin for SBP prophylaxis.  No signs of SBP at present.  He has history of Streptococcus bovis bacteremia.  Hypokalemia: Supplemented.  We will continue to check the levels.  Chronic diastolic heart failure: Echo done 5 months ago revealed ejection fraction of 50%.  Not volume overloaded.  Diabetes mellitus: Continue sliding solution here.  Continue home regimen on discharge.  Thrombocytopenia: Currently platelets are stable.   DVT prophylaxis: SCD Code Status: Full Family  Communication: None present at the bedside Disposition Plan: Likely tomorrow to home   Consultants: None  Procedures: None  Antimicrobials: Levaquin  Subjective: Patient seen and examined the bedside this morning.  Remains comfortable.  Currently alert and oriented.  Wants to go home.  Objective: Vitals:   11/03/17 0042 11/03/17 0649 11/03/17 0805 11/03/17 1113  BP: 119/60 (!) 115/58 104/62 (!) 121/52  Pulse: 74 71 70 73  Resp: 20 20 20 20   Temp: 98 F (36.7 C) 98.2 F (36.8 C) 98.3 F (36.8 C) 98.1 F (36.7 C)  TempSrc: Oral Oral Oral Oral  SpO2: 96% 96% 95% 100%  Weight:      Height:        Intake/Output Summary (Last 24 hours) at 11/03/2017 1351 Last data filed at 11/03/2017 0941 Gross per 24 hour  Intake 1160 ml  Output 325 ml  Net 835 ml   Filed Weights   11/02/17 2019  Weight: 117.3 kg (258 lb 9.6 oz)    Examination:  General exam: Appears calm and comfortable ,Not in distress, obese HEENT:PERRL,Oral mucosa moist, Ear/Nose normal on gross exam Respiratory system: Bilateral equal air entry, normal vesicular breath sounds, no wheezes or crackles  Cardiovascular system: S1 & S2 heard, RRR. No JVD, murmurs, rubs, gallops or clicks.  Trace pedal edema. Gastrointestinal system: Abdomen is distended, soft and nontender. No organomegaly or masses felt. Normal bowel sounds heard. Central nervous system: Alert and oriented. No focal neurological deficits. Extremities: Trace edema, no clubbing ,no cyanosis, distal peripheral pulses palpable. Skin: No rashes, lesions or ulcers,no icterus ,no pallor MSK: Normal muscle bulk,tone ,power Psychiatry: Judgement and insight appear normal. Mood &  affect appropriate.     Data Reviewed: I have personally reviewed following labs and imaging studies  CBC: Recent Labs  Lab 11/02/17 0420 11/03/17 0423  WBC 4.2 4.7  NEUTROABS 2.6  --   HGB 11.9* 10.8*  HCT 34.2* 31.8*  MCV 95.8 96.7  PLT 32* 35*   Basic Metabolic  Panel: Recent Labs  Lab 11/02/17 0420 11/03/17 0423  NA 137 137  K 2.8* 3.2*  CL 106 107  CO2 25 24  GLUCOSE 179* 175*  BUN 8 9  CREATININE 0.79 0.78  CALCIUM 7.4* 7.7*   GFR: Estimated Creatinine Clearance: 118.8 mL/min (by C-G formula based on SCr of 0.78 mg/dL). Liver Function Tests: Recent Labs  Lab 11/02/17 0420 11/03/17 0423  AST 66* 58*  ALT 36 33  ALKPHOS 217* 133*  BILITOT 2.5* 3.3*  PROT 6.0* 5.7*  ALBUMIN 2.1* 2.0*   No results for input(s): LIPASE, AMYLASE in the last 168 hours. Recent Labs  Lab 11/02/17 0421 11/03/17 0423  AMMONIA 108* 81*   Coagulation Profile: Recent Labs  Lab 11/02/17 0421  INR 1.71   Cardiac Enzymes: No results for input(s): CKTOTAL, CKMB, CKMBINDEX, TROPONINI in the last 168 hours. BNP (last 3 results) No results for input(s): PROBNP in the last 8760 hours. HbA1C: Recent Labs    11/02/17 0420  HGBA1C 8.1*   CBG: Recent Labs  Lab 11/02/17 1227 11/02/17 1626 11/02/17 2150 11/03/17 0703 11/03/17 1149  GLUCAP 157* 127* 268* 144* 225*   Lipid Profile: No results for input(s): CHOL, HDL, LDLCALC, TRIG, CHOLHDL, LDLDIRECT in the last 72 hours. Thyroid Function Tests: No results for input(s): TSH, T4TOTAL, FREET4, T3FREE, THYROIDAB in the last 72 hours. Anemia Panel: No results for input(s): VITAMINB12, FOLATE, FERRITIN, TIBC, IRON, RETICCTPCT in the last 72 hours. Sepsis Labs: Recent Labs  Lab 11/02/17 0421  LATICACIDVEN 1.32    No results found for this or any previous visit (from the past 240 hour(s)).       Radiology Studies: Ct Head Wo Contrast  Result Date: 11/02/2017 CLINICAL DATA:  Altered level of consciousness. History of cirrhosis, diabetes, hyperlipidemia. EXAM: CT HEAD WITHOUT CONTRAST TECHNIQUE: Contiguous axial images were obtained from the base of the skull through the vertex without intravenous contrast. COMPARISON:  CT HEAD April 27, 2017 FINDINGS: BRAIN: No intraparenchymal hemorrhage,  mass effect nor midline shift. Mild parenchymal brain volume loss. No hydrocephalus. Patchy supratentorial white matter hypodensities. 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 No acute large vascular territory infarcts. No abnormal extra-axial fluid collections. Basal cisterns are patent. VASCULAR: Moderate calcific atherosclerosis of the carotid siphons. SKULL: No skull fracture. No significant scalp soft tissue swelling. SINUSES/ORBITS: Atretic RIGHT maxillary sinus seen with chronic sinusitis. Mild sphenoid no ethmoidal sinusitis. Mastoid air cells are well aerated. Included ocular globes and orbital contents are non-suspicious. Status post bilateral ocular lens implants. OTHER: Poor dentition. IMPRESSION: 1. No acute intracranial process. 2. 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 3. Stable mild parenchymal brain volume loss for age. 4. Stable moderate chronic small vessel ischemic disease. Electronically Signed   By: Elon Alas M.D.   On: 11/02/2017 05:00   Dg Chest Port 1 View  Result Date: 11/02/2017 CLINICAL DATA:  Altered mental status. EXAM: PORTABLE CHEST 1 VIEW COMPARISON:  Chest radiograph June 30, 2017 FINDINGS: Cardiomediastinal silhouette is unremarkable for this low inspiratory examination with crowded vasculature markings. Mildly calcified aortic arch. Bronchitic changes. The lungs are clear without pleural effusions or focal consolidations. Trachea projects midline  and there is no pneumothorax. Included soft tissue planes and osseous structures are non-suspicious. Old LEFT clavicle fracture. Old LEFT rib fracture. IMPRESSION: Mild bronchitic changes without focal consolidation. Aortic Atherosclerosis (ICD10-I70.0). Electronically Signed   By: Elon Alas M.D.   On: 11/02/2017 04:14        Scheduled Meds: . atorvastatin  10 mg Oral Daily  . insulin aspart  0-5 Units Subcutaneous QHS  . insulin aspart  0-9 Units Subcutaneous TID WC  . lactulose  30 g Oral  BID  . levofloxacin  500 mg Oral Daily  . PARoxetine  10 mg Oral Daily  . [START ON 11/04/2017] potassium chloride  20 mEq Oral Daily  . spironolactone  25 mg Oral Daily  . cyanocobalamin  1,000 mcg Oral Daily   Continuous Infusions:   LOS: 1 day    Time spent: More than 50% of that time was spent in counseling and/or coordination of care.      Shelly Coss, MD [facility] Pager [phone]  If 7PM-7AM, please contact night-coverage [email or web address] Password [email or web address] 11/03/2017, 1:51 PM

## 2017-11-04 LAB — BASIC METABOLIC PANEL
Anion gap: 8 (ref 5–15)
BUN: 7 mg/dL (ref 6–20)
CHLORIDE: 103 mmol/L (ref 101–111)
CO2: 23 mmol/L (ref 22–32)
CREATININE: 0.63 mg/dL (ref 0.61–1.24)
Calcium: 8.2 mg/dL — ABNORMAL LOW (ref 8.9–10.3)
GFR calc Af Amer: >60 mL/min (ref >60)
GFR calc non Af Amer: >60 mL/min (ref >60)
Glucose, Bld: 156 mg/dL — ABNORMAL HIGH (ref 65–99)
Potassium: 3.1 mmol/L — ABNORMAL LOW (ref 3.5–5.1)
SODIUM: 134 mmol/L — AB (ref 135–145)

## 2017-11-04 LAB — GLUCOSE, CAPILLARY: GLUCOSE-CAPILLARY: 149 mg/dL — AB (ref 65–99)

## 2017-11-04 LAB — AMMONIA: AMMONIA: 72 umol/L — AB (ref 9–35)

## 2017-11-04 MED ORDER — POTASSIUM CHLORIDE CRYS ER 20 MEQ PO TBCR: 40.0000 meq | EXTENDED_RELEASE_TABLET | Freq: Once | ORAL | Status: DC

## 2017-11-04 MED ORDER — RIFAXIMIN 550 MG PO TABS: 550.0000 mg | ORAL_TABLET | Freq: Two times a day (BID) | ORAL | Status: DC

## 2017-11-04 MED ORDER — LACTULOSE 10 GM/15ML PO SOLN: 30.0000 g | Freq: Two times a day (BID) | ORAL | 2 refills | Status: DC

## 2017-11-04 MED ORDER — POTASSIUM CHLORIDE CRYS ER 20 MEQ PO TBCR: 20.0000 meq | EXTENDED_RELEASE_TABLET | Freq: Every day | ORAL | 0 refills | Status: DC

## 2017-11-04 NOTE — Progress Notes (Signed)
Discharge instructions given. Pt verbalized understanding and all questions were answered.  

## 2017-11-04 NOTE — Discharge Summary (Signed)
Physician Discharge Summary  Adam Butler HFW:[id number] DOB: [birth year] DOA: 11/02/2017  PCP: [name], NP  Admit date: 11/02/2017 Discharge date: 11/04/2017  Admitted From: Home Disposition:  Home  Discharge Condition:Stable CODE STATUS:FULL, DNR, Comfort Care Diet recommendation: Heart Healthy   Brief/Interim Summary:  Patient is a 33 old male with past medical history of cryptogenic cirrhosis, Nash, esophageal veracious, hepatic encephalopathy, diabetes, coronary artery disease status post stenting, not associated who presented to the emergency department with altered mental status.  Patient was admitted for the management of hepatic encephalopathy. Patient's ammonia level was found to be in the range of 100s.  He was a started on lactulose with improvement in the ammonia level.  His mental status significantly improved.  He has remained alert and oriented x4 since last 2 days.  Patient reported that he was taking just 15 mL of lactulose at home once a day.  We have increased the dose of lactulose.  He can be discharged to home today .  Following problems were addressed during his hospitalization:  Hepatic encephalopathy: Currently alert and oriented x4.  Presented with ammonia level of 108.  Today ammonia level has come down to 72 . We recommend lactulose 30 g twice a day at home.  He needs  to have 2-3 loose bowel movements a day. Check ammonia level in a week during the follow-up with the PCP.  Cirrhosis :Meld score of 16.  On his spironolactone and Lasix at home.  Follows with gastroenterology as an outpatient.  He was not on beta-blockers.  On Levaquin for SBP prophylaxis.  No signs of SBP at present.  He has history of Streptococcus bovis bacteremia.  Hypokalemia: Supplemented.    Discharged on potassium tablet.  Check potassium level in a week.  Chronic diastolic heart failure: Echo done 5 months ago revealed ejection fraction of 50%.  Not volume  overloaded.  Diabetes mellitus: Continue sliding solution here.  Continue home regimen on discharge.  Thrombocytopenia: Currently platelets are stable.     Discharge Diagnoses:  Principal Problem:   Hepatic encephalopathy ([place]) Active Problems:   Esophageal varices without bleeding (HCC)   Tobacco abuse   Liver cirrhosis secondary to NASH (HCC)   Hypokalemia   Type 2 diabetes mellitus (HCC)   GERD (gastroesophageal reflux disease)   Chronic diastolic CHF (congestive heart failure) ([place])   Thoracic ascending aortic aneurysm (HCC)   Thrombocytopenia (HCC)    Discharge Instructions  Discharge Instructions    Diet - low sodium heart healthy   Complete by:  As directed    Discharge instructions   Complete by:  As directed    1) Please take prescribed medications as instructed. 2) Take lactulose 30 g twice a day.  You need to have 2-3 loose bowel movements a day. 3) Follow up with your gastroenterologist in 2 weeks. 4) Follow PCP in a week.  Do a CBC , BMP and ammonia test during the follow-up in a week.   Increase activity slowly   Complete by:  As directed      Allergies as of 11/04/2017      Reactions   Isosorbide Nitrate Other (See Comments)   Nose bleeds      Medication List    TAKE these medications   aspirin EC 81 MG tablet Take 81 mg by mouth daily.   atorvastatin 10 MG tablet Commonly known as:  LIPITOR Take 10 mg by mouth daily.   cyanocobalamin 1000 MCG tablet Take 1,000 mcg  by mouth daily. Vitamin B12   fluticasone 50 MCG/ACT nasal spray Commonly known as:  FLONASE Place 2 sprays into both nostrils daily as needed for allergies or rhinitis.   furosemide 20 MG tablet Commonly known as:  LASIX TAKE 1 TABLET BY MOUTH ONCE DAILY   insulin regular 100 units/mL injection Commonly known as:  NOVOLIN R,HUMULIN R Inject 2-8 Units into the skin 3 (three) times daily as needed for high blood sugar (CBG >150).   lactulose 10 GM/15ML  solution Commonly known as:  CHRONULAC Take 45 mLs (30 g total) by mouth 2 (two) times daily. What changed:    how much to take  when to take this   levofloxacin 500 MG tablet Commonly known as:  LEVAQUIN Take 500 mg by mouth daily.   multivitamin with minerals Tabs tablet Take 1 tablet by mouth daily.   nitroGLYCERIN 0.4 MG SL tablet Commonly known as:  NITROSTAT Place 1 tablet (0.4 mg total) under the tongue every 5 (five) minutes as needed for chest pain (chest pain).   PARoxetine 10 MG tablet Commonly known as:  PAXIL TAKE 1 TABLET BY MOUTH ONCE DAILY   Potassium 99 MG Tabs Take 99 mg by mouth daily.   potassium chloride SA 20 MEQ tablet Commonly known as:  K-DUR,KLOR-CON Take 1 tablet (20 mEq total) by mouth daily. Start taking on:  11/05/2017   ranitidine 150 MG tablet Commonly known as:  ZANTAC Take 150 mg by mouth 2 (two) times daily as needed for heartburn.   spironolactone 25 MG tablet Commonly known as:  ALDACTONE Take 1 tablet (25 mg total) by mouth daily.      Follow-up Information    [name], NP. Schedule an appointment as soon as possible for a visit in 1 week(s).   Specialties:  Internal Medicine, Emergency Medicine Contact information: [place] [address] [phone]          Allergies  Allergen Reactions  . Isosorbide Nitrate Other (See Comments)    Nose bleeds    Consultations:  None   Procedures/Studies: Dg Ankle Complete Right  Result Date: 10/30/2017 CLINICAL DATA:  Ankle pain, fall EXAM: RIGHT ANKLE - COMPLETE 3+ VIEW COMPARISON:  None. FINDINGS: Normal alignment. No acute fracture. Locking intramedullary rod distal tibia for chronic fracture of tibia not imaged on the study. Chronic fracture of the mid fibula Diffuse soft tissue swelling. Mild calcification in the Achilles tendon insertion. IMPRESSION: Negative for acute fracture.  Diffuse soft tissue swelling. Electronically Signed   By:  Franchot Gallo M.D.   On: 10/30/2017 12:39   Ct Head Wo Contrast  Result Date: 11/02/2017 CLINICAL DATA:  Altered level of consciousness. History of cirrhosis, diabetes, hyperlipidemia. EXAM: CT HEAD WITHOUT CONTRAST TECHNIQUE: Contiguous axial images were obtained from the base of the skull through the vertex without intravenous contrast. COMPARISON:  CT HEAD April 27, 2017 FINDINGS: BRAIN: No intraparenchymal hemorrhage, mass effect nor midline shift. Mild parenchymal brain volume loss. No hydrocephalus. Patchy supratentorial white matter hypodensities. 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 No acute large vascular territory infarcts. No abnormal extra-axial fluid collections. Basal cisterns are patent. VASCULAR: Moderate calcific atherosclerosis of the carotid siphons. SKULL: No skull fracture. No significant scalp soft tissue swelling. SINUSES/ORBITS: Atretic RIGHT maxillary sinus seen with chronic sinusitis. Mild sphenoid no ethmoidal sinusitis. Mastoid air cells are well aerated. Included ocular globes and orbital contents are non-suspicious. Status post bilateral ocular lens implants. OTHER: Poor dentition. IMPRESSION: 1.  No acute intracranial process. 2. 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 3. Stable mild parenchymal brain volume loss for age. 4. Stable moderate chronic small vessel ischemic disease. Electronically Signed   By: Elon Alas M.D.   On: 11/02/2017 05:00   Dg Chest Port 1 View  Result Date: 11/02/2017 CLINICAL DATA:  Altered mental status. EXAM: PORTABLE CHEST 1 VIEW COMPARISON:  Chest radiograph June 30, 2017 FINDINGS: Cardiomediastinal silhouette is unremarkable for this low inspiratory examination with crowded vasculature markings. Mildly calcified aortic arch. Bronchitic changes. The lungs are clear without pleural effusions or focal consolidations. Trachea projects midline and there is no pneumothorax. Included soft tissue planes and osseous structures  are non-suspicious. Old LEFT clavicle fracture. Old LEFT rib fracture. IMPRESSION: Mild bronchitic changes without focal consolidation. Aortic Atherosclerosis (ICD10-I70.0). Electronically Signed   By: Elon Alas M.D.   On: 11/02/2017 04:14   Dg Foot Complete Right  Result Date: 10/30/2017 CLINICAL DATA:  Acute foot pain.  Fall EXAM: RIGHT FOOT COMPLETE - 3+ VIEW COMPARISON:  None. FINDINGS: Negative for fracture. No degenerative change or erosion. Diffuse soft tissue swelling of the foot and ankle. Right fixation of chronic tibial fracture IMPRESSION: Negative right foot Electronically Signed   By: Franchot Gallo M.D.   On: 10/30/2017 12:38       Subjective:   Discharge Exam: Vitals:   11/04/17 0436 11/04/17 0756  BP: (!) 116/52 (!) 125/58  Pulse: 72 73  Resp: 20 16  Temp: 98.2 F (36.8 C)   SpO2: 97% 97%   Vitals:   11/03/17 2010 11/03/17 2311 11/04/17 0436 11/04/17 0756  BP: 116/61 (!) 111/52 (!) 116/52 (!) 125/58  Pulse: 72 70 72 73  Resp: 18 20 20 16   Temp: 98.5 F (36.9 C)  98.2 F (36.8 C)   TempSrc: Oral  Oral   SpO2:  99% 97% 97%  Weight:      Height:        General: Pt is alert, awake, not in acute distress Cardiovascular: RRR, S1/S2 +, no rubs, no gallops Respiratory: CTA bilaterally, no wheezing, no rhonchi Abdominal: Soft, NT, ND, bowel sounds + Extremities: no edema, no cyanosis    The results of significant diagnostics from this hospitalization (including imaging, microbiology, ancillary and laboratory) are listed below for reference.     Microbiology: No results found for this or any previous visit (from the past 240 hour(s)).   Labs: BNP (last 3 results) Recent Labs    03/02/17 1009  BNP 20.3   Basic Metabolic Panel: Recent Labs  Lab 11/02/17 0420 11/03/17 0423 11/04/17 0558  NA 137 137 134*  K 2.8* 3.2* 3.1*  CL 106 107 103  CO2 25 24 23   GLUCOSE 179* 175* 156*  BUN 8 9 7   CREATININE 0.79 0.78 0.63  CALCIUM 7.4* 7.7* 8.2*    Liver Function Tests: Recent Labs  Lab 11/02/17 0420 11/03/17 0423  AST 66* 58*  ALT 36 33  ALKPHOS 217* 133*  BILITOT 2.5* 3.3*  PROT 6.0* 5.7*  ALBUMIN 2.1* 2.0*   No results for input(s): LIPASE, AMYLASE in the last 168 hours. Recent Labs  Lab 11/02/17 0421 11/03/17 0423 11/04/17 0558  AMMONIA 108* 81* 72*   CBC: Recent Labs  Lab 11/02/17 0420 11/03/17 0423  WBC 4.2 4.7  NEUTROABS 2.6  --   HGB 11.9* 10.8*  HCT 34.2* 31.8*  MCV 95.8 96.7  PLT 32* 35*   Cardiac Enzymes: No results for input(s): CKTOTAL, CKMB,  CKMBINDEX, TROPONINI in the last 168 hours. BNP: Invalid input(s): POCBNP CBG: Recent Labs  Lab 11/03/17 0703 11/03/17 1149 11/03/17 1645 11/03/17 2128 11/04/17 0550  GLUCAP 144* 225* 195* 189* 149*   D-Dimer No results for input(s): DDIMER in the last 72 hours. Hgb A1c Recent Labs    11/02/17 0420  HGBA1C 8.1*   Lipid Profile No results for input(s): CHOL, HDL, LDLCALC, TRIG, CHOLHDL, LDLDIRECT in the last 72 hours. Thyroid function studies No results for input(s): TSH, T4TOTAL, T3FREE, THYROIDAB in the last 72 hours.  Invalid input(s): FREET3 Anemia work up No results for input(s): VITAMINB12, FOLATE, FERRITIN, TIBC, IRON, RETICCTPCT in the last 72 hours. Urinalysis    Component Value Date/Time   COLORURINE YELLOW 11/02/2017 0623   APPEARANCEUR CLEAR 11/02/2017 0623   LABSPEC 1.009 11/02/2017 0623   PHURINE 7.0 11/02/2017 0623   GLUCOSEU NEGATIVE 11/02/2017 0623   HGBUR SMALL (A) 11/02/2017 0623   BILIRUBINUR NEGATIVE 11/02/2017 0623   KETONESUR NEGATIVE 11/02/2017 0623   PROTEINUR NEGATIVE 11/02/2017 0623   UROBILINOGEN 0.2 05/14/2015 1813   NITRITE NEGATIVE 11/02/2017 0623   LEUKOCYTESUR NEGATIVE 11/02/2017 0623   Sepsis Labs Invalid input(s): PROCALCITONIN,  WBC,  LACTICIDVEN Microbiology No results found for this or any previous visit (from the past 240 hour(s)).   Time coordinating discharge: 35  minutes  SIGNED:   Shelly Coss, MD  [facility] 11/04/2017, 11:17 AM Pager [phone]  If 7PM-7AM, please contact night-coverage [email or web address] Password [email or web address]

## 2017-11-06 ENCOUNTER — Telehealth: Payer: Self-pay | Admitting: Internal Medicine

## 2017-11-06 NOTE — Telephone Encounter (Signed)
Yes, 2 15 minute appts is fine

## 2017-11-06 NOTE — Telephone Encounter (Signed)
See below crm  Can I combine  To appointment to make 30 I will need to use one same day appointment? Thanks Robin  Copied from [employer] [phone]. Topic: General - Other >> Nov 06, 2017 10:57 AM Carolyn Stare wrote:  Pt need a hosp fup with [name] nothing is available this week or next can he be worked in   [phone] or [phone]

## 2017-11-07 ENCOUNTER — Telehealth: Payer: Self-pay | Admitting: *Deleted

## 2017-11-07 NOTE — Telephone Encounter (Signed)
Lm requesting return call to complete TCM and confirm hosp f/u appt   Also see additional phone note for scheduling should pt return call.

## 2017-11-08 NOTE — Telephone Encounter (Signed)
Left message on both numbers asking pt to call office

## 2017-11-08 NOTE — Telephone Encounter (Signed)
Appointment 5/7

## 2017-11-08 NOTE — Telephone Encounter (Signed)
Yes, 2 15 minute appts is fine

## 2017-11-14 ENCOUNTER — Telehealth: Payer: Self-pay | Admitting: Radiology

## 2017-11-14 ENCOUNTER — Ambulatory Visit (INDEPENDENT_AMBULATORY_CARE_PROVIDER_SITE_OTHER): Payer: PPO | Admitting: Internal Medicine

## 2017-11-14 ENCOUNTER — Encounter: Payer: Self-pay | Admitting: Internal Medicine

## 2017-11-14 VITALS — BP 122/64 | HR 82 | Temp 98.1°F | Wt 265.0 lb

## 2017-11-14 DIAGNOSIS — K7460 Unspecified cirrhosis of liver: Secondary | ICD-10-CM | POA: Diagnosis not present

## 2017-11-14 DIAGNOSIS — K7290 Hepatic failure, unspecified without coma: Secondary | ICD-10-CM

## 2017-11-14 DIAGNOSIS — K7682 Hepatic encephalopathy: Secondary | ICD-10-CM

## 2017-11-14 DIAGNOSIS — S93401A Sprain of unspecified ligament of right ankle, initial encounter: Secondary | ICD-10-CM | POA: Diagnosis not present

## 2017-11-14 DIAGNOSIS — R791 Abnormal coagulation profile: Secondary | ICD-10-CM

## 2017-11-14 DIAGNOSIS — E119 Type 2 diabetes mellitus without complications: Secondary | ICD-10-CM

## 2017-11-14 DIAGNOSIS — K7581 Nonalcoholic steatohepatitis (NASH): Secondary | ICD-10-CM

## 2017-11-14 DIAGNOSIS — Z794 Long term (current) use of insulin: Secondary | ICD-10-CM

## 2017-11-14 NOTE — Progress Notes (Signed)
Subjective:    Patient ID: Adam Butler, male    DOB: 1956/07/10, 62 y.o.   MRN: [id number]  HPI  Patient presents to the clinic today  for TCM hospital follow-up.  He went to the ER on 11/02/2017 with complaint of altered mental status and a fall.  CT of the head was negative for acute findings.  Chest x-ray showed bronchitic changes but negative for infiltrate.  X-ray of the right ankle was negative for acute fracture.  Patient reports he had decreased his lactulose to 15 mL's 1 time a day.  History of NASH cirrhosis.  They treated him with lactulose in the hospital.  They increased his Lactulose to 30 mg 2 times a day with a goal of 2-3 loose bowel movements per day.  They advised him to follow-up with PCP and GI in 1 week.  Since discharge, he reports he is doing well. He denies confusion. He reports he is having anywhere from 2-14 loose BM's per day. His right ankle is still swollen, but he denies pain.   Review of Systems      Past Medical History:  Diagnosis Date  . Arthritis   . Cholelithiasis   . Cirrhosis ([place]) 2011   Cryptogenic, Likely NASH. Family/pt deny EtOH. HCV, HBV, HAV negative. ANA negative. AMA positive. Ascites 12/11  . Coronary artery disease    Inferior MI 12/11; LHC with occluded mid CFX and 80% proximal RCA. EF 55%. He had 3.0 x 28 vision BMS to CFX  . Diastolic CHF, acute ([place])    Echo 12/11 with ef 50-55% and mild LVH. EF 55% by LV0gram in 12/11  . Esophageal varices ([place]) 2011, 2013   no hx acute variceal bleed  . Hepatic encephalopathy ([place]) 2011, 12/2013  . Hyperlipemia   . Myocardial infarction [facility]) ? 2012  . PFO (patent foramen ovale): Per TEE 04/20/2015 04/20/2015  . Portal hypertension ([place])   . Rectal varices   . S/P coronary artery stent placement 06/2010  . SVT (supraventricular tachycardia) ([place])    1/12: appeared to be an ectopic atrial tachycardia. Required DCCV with hemodynamic instability  . Type II diabetes mellitus ([place]) 2011     Current Outpatient Medications  Medication Sig Dispense Refill  . aspirin EC 81 MG tablet Take 81 mg by mouth daily.    Marland Kitchen atorvastatin (LIPITOR) 10 MG tablet Take 10 mg by mouth daily.    . cyanocobalamin 1000 MCG tablet Take 1,000 mcg by mouth daily. Vitamin B12    . fluticasone (FLONASE) 50 MCG/ACT nasal spray Place 2 sprays into both nostrils daily as needed for allergies or rhinitis.     . furosemide (LASIX) 20 MG tablet TAKE 1 TABLET BY MOUTH ONCE DAILY 30 tablet 2  . insulin regular (NOVOLIN R,HUMULIN R) 100 units/mL injection Inject 2-8 Units into the skin 3 (three) times daily as needed for high blood sugar (CBG >150).     . lactulose (CHRONULAC) 10 GM/15ML solution Take 45 mLs (30 g total) by mouth 2 (two) times daily. 900 mL 2  . levofloxacin (LEVAQUIN) 500 MG tablet Take 500 mg by mouth daily.     . Multiple Vitamin (MULTIVITAMIN WITH MINERALS) TABS tablet Take 1 tablet by mouth daily.    . nitroGLYCERIN (NITROSTAT) 0.4 MG SL tablet Place 1 tablet (0.4 mg total) under the tongue every 5 (five) minutes as needed for chest pain (chest pain). 25 tablet 5  . PARoxetine (PAXIL) 10 MG tablet TAKE 1 TABLET  BY MOUTH ONCE DAILY 30 tablet 1  . Potassium 99 MG TABS Take 99 mg by mouth daily.    . potassium chloride SA (K-DUR,KLOR-CON) 20 MEQ tablet Take 1 tablet (20 mEq total) by mouth daily. 7 tablet 0  . ranitidine (ZANTAC) 150 MG tablet Take 150 mg by mouth 2 (two) times daily as needed for heartburn.     . spironolactone (ALDACTONE) 25 MG tablet Take 1 tablet (25 mg total) by mouth daily. 1 tablet 0   No current facility-administered medications for this visit.     Allergies  Allergen Reactions  . Isosorbide Nitrate Other (See Comments)    Nose bleeds    Family History  Problem Relation Age of Onset  . Heart attack Brother 46       MI  . Diabetes Brother   . Heart attack Father   . Diabetes Father   . COPD Father   . COPD Mother   . Heart attack Brother   . Stroke Neg  Hx     Social History   Socioeconomic History  . Marital status: Married    Spouse name: Not on file  . Number of children: 3  . Years of education: Not on file  . Highest education level: Not on file  Occupational History  . Occupation: Merchandiser, retail: OTHER    Comment: Worked in Theatre manager prior  Social Needs  . Financial resource strain: Not on file  . Food insecurity:    Worry: Not on file    Inability: Not on file  . Transportation needs:    Medical: Not on file    Non-medical: Not on file  Tobacco Use  . Smoking status: Current Every Day Smoker    Packs/day: 0.25    Years: 36.00    Pack years: 9.00    Types: E-cigarettes, Cigarettes  . Smokeless tobacco: Never Used  . Tobacco comment: uses vapor cigarettes (2016 ); I smoke about 4 cigarettes a day  Substance and Sexual Activity  . Alcohol use: No    Alcohol/week: 0.0 oz  . Drug use: No  . Sexual activity: Never  Lifestyle  . Physical activity:    Days per week: Not on file    Minutes per session: Not on file  . Stress: Not on file  Relationships  . Social connections:    Talks on phone: Not on file    Gets together: Not on file    Attends religious service: Not on file    Active member of club or organization: Not on file    Attends meetings of clubs or organizations: Not on file    Relationship status: Not on file  . Intimate partner violence:    Fear of current or ex partner: Not on file    Emotionally abused: Not on file    Physically abused: Not on file    Forced sexual activity: Not on file  Other Topics Concern  . Not on file  Social History Narrative   Married   Gets regular exercise: walking     Constitutional: Denies fever, malaise, fatigue, headache or abrupt weight changes.  Respiratory: Denies difficulty breathing, shortness of breath, cough or sputum production.   Cardiovascular: Denies chest pain, chest tightness, palpitations or swelling in the hands or feet.   Gastrointestinal: Pt reports loose stool. Denies abdominal pain, bloating, constipation, or blood in the stool.  Musculoskeletal: Pt reports right ankle swelling. Denies decrease in range of motion, difficulty with  gait, muscle pain or joint pain.  Neurological: Denies dizziness, difficulty with memory, difficulty with speech or problems with balance and coordination.    No other specific complaints in a complete review of systems (except as listed in HPI above).  Objective:   Physical Exam  BP 122/64   Pulse 82   Temp 98.1 F (36.7 C) (Oral)   Wt 265 lb (120.2 kg)   SpO2 98%   BMI 41.50 kg/m  Wt Readings from Last 3 Encounters:  11/14/17 265 lb (120.2 kg)  11/02/17 258 lb 9.6 oz (117.3 kg)  10/30/17 258 lb 8 oz (117.3 kg)    General: Appears his stated age, obese, chronically ill appearing, in NAD. Skin: Warm, dry and intact. Cardiovascular: Normal rate and rhythm. S1,S2 noted.  No murmur, rubs or gallops noted.  Pulmonary/Chest: Normal effort and positive vesicular breath sounds. No respiratory distress. No wheezes, rales or ronchi noted.  Abdomen: Soft and nontender. Normal bowel sounds. No distention or masses noted.  Musculoskeletal: Normal flexion, extension and rotation of right ankle. 1+ swelling noted RLE.  Neurological: Alert and oriented. Psychiatric: He is making sexually explicit comments: Do you want to go camping with me, come sit on my lap, let me see some of that cleavage.  BMET    Component Value Date/Time   NA 136 11/14/2017 1616   K 2.9 (L) 11/14/2017 1616   CL 105 11/14/2017 1616   CO2 27 11/14/2017 1616   GLUCOSE 163 (H) 11/14/2017 1616   BUN 7 11/14/2017 1616   CREATININE 0.70 11/14/2017 1616   CREATININE 0.74 03/15/2011 0827   CALCIUM 7.6 (L) 11/14/2017 1616   GFRNONAA >60 11/04/2017 0558   GFRAA >60 11/04/2017 0558    Lipid Panel     Component Value Date/Time   CHOL 133 11/17/2016 0854   TRIG 133.0 11/17/2016 0854   HDL 39.90 11/17/2016  0854   CHOLHDL 3 11/17/2016 0854   VLDL 26.6 11/17/2016 0854   LDLCALC 67 11/17/2016 0854    CBC    Component Value Date/Time   WBC 4.7 11/14/2017 1616   RBC 3.42 (L) 11/14/2017 1616   HGB 11.8 (L) 11/14/2017 1616   HCT 34.2 (L) 11/14/2017 1616   PLT 45.0 Repeated and verified X2. (LL) 11/14/2017 1616   MCV 100.0 11/14/2017 1616   MCH 32.8 11/03/2017 0423   MCHC 34.4 11/14/2017 1616   RDW 16.6 (H) 11/14/2017 1616   LYMPHSABS 0.8 11/02/2017 0420   MONOABS 0.7 11/02/2017 0420   EOSABS 0.1 11/02/2017 0420   BASOSABS 0.0 11/02/2017 0420    Hgb A1C Lab Results  Component Value Date   HGBA1C 8.1 (H) 11/02/2017            Assessment & Plan:  [facility] Follow-up for Hepatic Encephalopathy secondary to NASH Cirrhosis, Right Ankle Sprain:  Hospital notes, labs and imaging reviewed CBC, CMET and ammonia today He will follow up with GI as planned Discussed the recommendation of making sure he was having 2-3 loose BM's per day Continue Lactulose as ordered Discussed RICE therapy for right ankle swelling  Return precautions discussed. [name], NP

## 2017-11-14 NOTE — Telephone Encounter (Signed)
Today when the patient came to the lab he was very inappropriate speaking to myself and my co worker, Lendon Collar. Very sexually suggestive, asking for her to sit on his lap, go camping with him, leave her husband, "I think she squirted". Among other comment while I was trying to do a venipuncture. When I was finished with the patient, he said to Lauren " I just need one more look at you before I leave" I did inform the patient he needed to stop his behavior or the Dr would be involved, he said, " he didn't care, she ain't going to do anything".

## 2017-11-15 ENCOUNTER — Encounter: Payer: Self-pay | Admitting: Internal Medicine

## 2017-11-15 LAB — CBC
HCT: 34.2 % — ABNORMAL LOW (ref 39.0–52.0)
Hemoglobin: 11.8 g/dL — ABNORMAL LOW (ref 13.0–17.0)
MCHC: 34.4 g/dL (ref 30.0–36.0)
MCV: 100 fl (ref 78.0–100.0)
RBC: 3.42 Mil/uL — AB (ref 4.22–5.81)
RDW: 16.6 % — ABNORMAL HIGH (ref 11.5–15.5)
WBC: 4.7 10*3/uL (ref 4.0–10.5)

## 2017-11-15 LAB — COMPREHENSIVE METABOLIC PANEL
ALBUMIN: 2.2 g/dL — AB (ref 3.5–5.2)
ALT: 37 U/L (ref 0–53)
AST: 66 U/L — AB (ref 0–37)
Alkaline Phosphatase: 158 U/L — ABNORMAL HIGH (ref 39–117)
BILIRUBIN TOTAL: 2.5 mg/dL — AB (ref 0.2–1.2)
BUN: 7 mg/dL (ref 6–23)
CALCIUM: 7.6 mg/dL — AB (ref 8.4–10.5)
CO2: 27 mEq/L (ref 19–32)
CREATININE: 0.7 mg/dL (ref 0.40–1.50)
Chloride: 105 mEq/L (ref 96–112)
GFR: 121.58 mL/min (ref >60.00)
Glucose, Bld: 163 mg/dL — ABNORMAL HIGH (ref 70–99)
Potassium: 2.9 mEq/L — ABNORMAL LOW (ref 3.5–5.1)
SODIUM: 136 meq/L (ref 135–145)
TOTAL PROTEIN: 5.9 g/dL — AB (ref 6.0–8.3)

## 2017-11-15 NOTE — Telephone Encounter (Signed)
See OV dated 11/14/17. Pt to be dismissed.

## 2017-11-15 NOTE — Patient Instructions (Addendum)
I will call you with your lab results.   Call your gastroenterologist to see if he wants to see you before the procedure or if there is anything else to do before that procedure.  He did order labs, call them to see where you are do get them done.   Cirrhosis Cirrhosis is long-term (chronic) liver injury. The liver is your largest internal organ, and it performs many functions. The liver converts food into energy, removes toxic material from your blood, makes important proteins, and absorbs necessary vitamins from your diet. If you have cirrhosis, it means many of your healthy liver cells have been replaced by scar tissue. This prevents blood from flowing through your liver, which makes it difficult for your liver to function. This scarring is not reversible, but treatment can prevent it from getting worse. What are the causes? Hepatitis C and long-term alcohol abuse are the most common causes of cirrhosis. Other causes include:  Nonalcoholic fatty liver disease.  Hepatitis B infection.  Autoimmune hepatitis.  Diseases that cause blockage of ducts inside the liver.  Inherited liver diseases.  Reactions to certain long-term medicines.  Parasitic infections.  Long-term exposure to certain toxins.  What increases the risk? You may have a higher risk of cirrhosis if you:  Have certain hepatitis viruses.  Abuse alcohol, especially if you are male.  Are overweight.  Share needles.  Have unprotected sex with someone who has hepatitis.  What are the signs or symptoms? You may not have any signs and symptoms at first. Symptoms may not develop until the damage to your liver starts to get worse. Signs and symptoms of cirrhosis may include:  Tenderness in the right-upper part of your abdomen.  Weakness and tiredness (fatigue).  Loss of appetite.  Nausea.  Weight loss and muscle loss.  Itchiness.  Yellow skin and eyes (jaundice).  Buildup of fluid in the abdomen  (ascites).  Swelling of the feet and ankles (edema).  Appearance of tiny blood vessels under the skin.  Mental confusion.  Easy bruising and bleeding.  How is this diagnosed? Your health care provider may suspect cirrhosis based on your symptoms and medical history, especially if you have other medical conditions or a history of alcohol abuse. Your health care provider will do a physical exam to feel your liver and check for signs of cirrhosis. Your health care provider may perform other tests, including:  Blood tests to check: ? Whether you have hepatitis B or C. ? Kidney function. ? Liver function.  Imaging tests such as: ? MRI or CT scan to look for changes seen in advanced cirrhosis. ? Ultrasound to see if normal liver tissue is being replaced by scar tissue.  A procedure using a long needle to take a sample of liver tissue (biopsy) for examination under a microscope. Liver biopsy can confirm the diagnosis of cirrhosis.  How is this treated? Treatment depends on how damaged your liver is and what caused the damage. Treatment may include treating cirrhosis symptoms or treating the underlying causes of the condition to try to slow the progression of the damage. Treatment may include:  Making lifestyle changes, such as: ? Eating a healthy diet. ? Restricting salt intake. ? Maintaining a healthy weight. ? Not abusing drugs or alcohol.  Taking medicines to: ? Treat liver infections or other infections. ? Control itching. ? Reduce fluid buildup. ? Reduce certain blood toxins. ? Reduce risk of bleeding from enlarged blood vessels in the stomach or esophagus (varices).  If varices are causing bleeding problems, you may need treatment with a procedure that ties up the vessels causing them to fall off (band ligation).  If cirrhosis is causing your liver to fail, your health care provider may recommend a liver transplant.  Other treatments may be recommended depending on any  complications of cirrhosis, such as liver-related kidney failure (hepatorenal syndrome).  Follow these instructions at home:  Take medicines only as directed by your health care provider. Do not use drugs that are toxic to your liver. Ask your health care provider before taking any new medicines, including over-the-counter medicines.  Rest as needed.  Eat a well-balanced diet. Ask your health care provider or dietitian for more information.  You may have to follow a low-salt diet or restrict your water intake as directed.  Do not drink alcohol. This is especially important if you are taking acetaminophen.  Keep all follow-up visits as directed by your health care provider. This is important. Contact a health care provider if:  You have fatigue or weakness that is getting worse.  You develop swelling of the hands, feet, legs, or face.  You have a fever.  You develop loss of appetite.  You have nausea or vomiting.  You develop jaundice.  You develop easy bruising or bleeding. Get help right away if:  You vomit bright red blood or a material that looks like coffee grounds.  You have blood in your stools.  Your stools appear black and tarry.  You become confused.  You have chest pain or trouble breathing. This information is not intended to replace advice given to you by your health care provider. Make sure you discuss any questions you have with your health care provider. Document Released: 06/27/2005 Document Revised: 11/05/2015 Document Reviewed: 03/05/2014 Elsevier Interactive Patient Education  2018 [employer].  Hepatic Encephalopathy Hepatic encephalopathy is a loss of brain function from advanced liver disease. The effects of the condition depend on the type of liver damage and how severe it is. In some cases, hepatic encephalopathy can be reversed. What are the causes? The exact cause of hepatic encephalopathy is not known. What increases the risk? You have a  higher risk of getting this condition if your liver is damaged. When the liver is damaged harmful substances called toxins can build up in the body. Certain toxins, such as ammonia, can harm your brain. Conditions that can cause liver damage include:  An infection.  Dehydration.  Intestinal bleeding.  Drinking too much alcohol.  Taking certain medicines, including tranquilizers, water pills (diuretics), antidepressants, or sleeping pills.  What are the signs or symptoms? Signs and symptoms may develop suddenly. Or, they may develop slowly and get worse gradually. Symptoms can range from mild to severe. Mild Hepatic Encephalopathy  Mild confusion.  Personality and mood changes.  Anxiety and agitation.  Drowsiness.  Loss of mental abilities.  Musty or sweet-smelling breath. Worsening or Severe Hepatic Encephalopathy  Slowed movement.  Slurred speech.  Extreme personality changes.  Disorientation.  Abnormal shaking or flapping of the hands.  Coma. How is this diagnosed? To make a diagnosis, your health care provider will do a physical exam. To rule out other causes of your signs and symptoms, he or she may order tests. You may have:  Blood tests. These may be done to check your ammonia level, measure how long it takes your blood to clot, and check for infection.  Liver function tests. These may be done to check how well your liver  is working.  MRI and CT scans. These may be done to check for a brain disorder.  Electroencephalogram (EEG). This may be done to measure the electrical activity in your brain.  How is this treated? The first step in treatment is identifying and treating possible triggers. The next step is involves taking medicine to lower the level of toxins in the body and to prevent ammonia from building up. You may need to take:  Antibiotics to reduce the ammonia-producing bacteria in your gut.  Lactulose to help flush ammonia from the gut.  Follow  these instructions at home: Eating and drinking  Follow a low-protein diet that includes plenty of fruits, vegetables, and whole grains, as directed by your health care provider. Ammonia is produced when you digest high-protein foods.  Work with a Microbiologist or with your health care provider to make sure you are getting the right balance of protein and minerals.  Drink enough fluids to keep your urine clear or pale yellow. Drinking plenty of water helps prevent constipation.  Do not drink alcohol or use illegal drugs. Medicines  Only take medicine as directed by your health care provider.  If you were prescribed an antibiotic medicine, finish it all even if you start to feel better.  Do not start any new medicines, including over-the-counter medicines, without first checking with your health care provider. Contact a health care provider if:  You have new symptoms.  Your symptoms change.  Your symptoms get worse.  You have a fever.  You are constipated.  You have persistent nausea, vomiting, or diarrhea. Get help right away if:  You become very confused or drowsy.  You vomit blood or material that looks like coffee grounds.  Your stool is bloody or black or looks like tar. This information is not intended to replace advice given to you by your health care provider. Make sure you discuss any questions you have with your health care provider. Document Released: 09/06/2006 Document Revised: 12/03/2015 Document Reviewed: 02/12/2014 Elsevier Interactive Patient Education  2018 [employer].

## 2017-11-16 LAB — TIQ-MISC

## 2017-11-17 LAB — AMMONIA

## 2017-11-20 ENCOUNTER — Other Ambulatory Visit: Payer: Self-pay | Admitting: Internal Medicine

## 2017-11-20 MED FILL — levoFLOXacin 500 MG TABS: 500 | 30 days supply | Qty: 30 | Fill #4

## 2017-11-22 ENCOUNTER — Encounter: Payer: Self-pay | Admitting: Internal Medicine

## 2017-11-23 ENCOUNTER — Telehealth: Payer: Self-pay | Admitting: Internal Medicine

## 2017-11-23 MED ORDER — POTASSIUM CHLORIDE CRYS ER 20 MEQ PO TBCR: 20.0000 meq | EXTENDED_RELEASE_TABLET | Freq: Two times a day (BID) | ORAL | 4 refills | Status: DC

## 2017-11-23 NOTE — Addendum Note (Signed)
Addended by: Lurlean Nanny on: 11/23/2017 09:58 AM   Modules accepted: Orders

## 2017-11-23 NOTE — Telephone Encounter (Signed)
Patient dismissed from [facility] by [facility] FNP, effective Nov 22, 2017. Dismissal letter sent out by certified / registered mail.  daj

## 2017-11-23 NOTE — Telephone Encounter (Signed)
I spoke to the patient today and advised that based upon his inappropriate comments and behavior during his 11/14/17 appointment, he is being dismissed from our practice and all [facility] practices.  He stated he could not recall making any inappropriate comments and asked what he said.  I read the comments to him and he stated he didn't recall making those statements.  He apologized for offending staff and Rollene Fare and said he just likes to "cut up"  He said it was not his intention to offend anyone.  I explained that the dismissal is final and he asked who he should see.  I advised that any [facility] practice would be able to see his records and see him.  I also advised he would be receiving a letter in the mail regarding our conversations today.  Patient voiced understanding.

## 2017-11-28 NOTE — Telephone Encounter (Signed)
Received signed domestic return receipt verifying delivery of certified letter on Nov 24, 2017. Article number [id number]

## 2017-12-22 ENCOUNTER — Encounter: Payer: Self-pay | Admitting: Family Medicine

## 2017-12-22 ENCOUNTER — Ambulatory Visit (INDEPENDENT_AMBULATORY_CARE_PROVIDER_SITE_OTHER): Payer: PPO | Admitting: Family Medicine

## 2017-12-22 ENCOUNTER — Other Ambulatory Visit: Payer: Self-pay

## 2017-12-22 VITALS — BP 124/68 | HR 74 | Temp 97.7°F | Resp 18 | Ht 66.0 in | Wt 253.0 lb

## 2017-12-22 DIAGNOSIS — E876 Hypokalemia: Secondary | ICD-10-CM

## 2017-12-22 DIAGNOSIS — F329 Major depressive disorder, single episode, unspecified: Secondary | ICD-10-CM | POA: Diagnosis not present

## 2017-12-22 DIAGNOSIS — K7460 Unspecified cirrhosis of liver: Secondary | ICD-10-CM

## 2017-12-22 DIAGNOSIS — K7290 Hepatic failure, unspecified without coma: Secondary | ICD-10-CM

## 2017-12-22 DIAGNOSIS — Z7689 Persons encountering health services in other specified circumstances: Secondary | ICD-10-CM | POA: Diagnosis not present

## 2017-12-22 DIAGNOSIS — R791 Abnormal coagulation profile: Secondary | ICD-10-CM

## 2017-12-22 DIAGNOSIS — Z794 Long term (current) use of insulin: Secondary | ICD-10-CM

## 2017-12-22 DIAGNOSIS — F32A Depression, unspecified: Secondary | ICD-10-CM

## 2017-12-22 DIAGNOSIS — E119 Type 2 diabetes mellitus without complications: Secondary | ICD-10-CM

## 2017-12-22 DIAGNOSIS — K7682 Hepatic encephalopathy: Secondary | ICD-10-CM

## 2017-12-22 DIAGNOSIS — K7581 Nonalcoholic steatohepatitis (NASH): Secondary | ICD-10-CM | POA: Diagnosis not present

## 2017-12-22 MED ORDER — FUROSEMIDE 20 MG PO TABS: 20.0000 mg | ORAL_TABLET | Freq: Every day | ORAL | 2 refills | Status: DC

## 2017-12-22 NOTE — Progress Notes (Signed)
Patient ID: Adam Butler, male    DOB: 01/21/1956, 62 y.o.   MRN: [id number]  PCP: Delsa Grana, PA-C  Chief Complaint  Patient presents with  . New Patient (Initial Visit)  . Depression  . Hospitalization Follow-up     Subjective:   Adam Butler is a 62 y.o. male, presents to clinic to establish care, he has extensive medical history, he reports leaving prior clinic because he had a hard time getting timely appointments, however per chart review, he was discharged from the practice.  He also states he's here for follow up s/p hospitalization for hepatic encephalopathy, and he is experiencing depression with recent marital problems. He feels down every day, his wife wants him out of the house but he refused to leave his house.  He states she wants a divorce and he doesn't.  They have not gone to counseling, and he states she would not go if he asked her to.  The situation is depressing to him, frustrating and upsetting.  He denies any past treatment for depression.  He denies SI, HI, AVH.    He was hospitalized 11/02/17-11/04/17 for hepatic encephalopathy.  He did follow up with his old PCP office 11/14/17 and had labs done.  At that visit he was discharged.  He has upcoming appt with GI for a procedure.  Hospital problems include diastolic CHF, thoracic ascending aortic aneurysm, thrombocytopenia, GERD, hypokalemia, T2DM, tobacco abuse, liver cirrhosis secondary to NASH, esophageal varices w/o bleeding.  Labs from 11/16/17 were at his baseline, but ammonia lab did not result due to error.  He states he is taking lactulose, with instructions to have 3-4 BM a day, but he is having 8-10 BMs a day.  He has no other physical complaints today.  Patient Active Problem List   Diagnosis Date Noted  . Chronic diastolic CHF (congestive heart failure) ([place])   . Thoracic ascending aortic aneurysm ([place])   . OA (osteoarthritis) of knee 07/19/2016  . Hepatic encephalopathy ([place]) 12/16/2015  . GERD  (gastroesophageal reflux disease) 09/29/2015  . Type 2 diabetes mellitus ([place]) 08/18/2015  . CAD in native artery   . Liver cirrhosis secondary to NASH ([place]) 06/06/2015  . Tobacco abuse 05/04/2015  . PFO (patent foramen ovale): Per TEE 04/20/2015 04/20/2015  . Esophageal varices without bleeding ([place]) 08/16/2010     Prior to Admission medications   Medication Sig Start Date End Date Taking? Authorizing Provider  aspirin EC 81 MG tablet Take 81 mg by mouth daily.   Yes [provider]  atorvastatin (LIPITOR) 10 MG tablet Take 10 mg by mouth daily.   Yes [provider]  atorvastatin (LIPITOR) 10 MG tablet TAKE 1 TABLET BY MOUTH ONCE DAILY 11/21/17  Yes Baity, Coralie Keens, NP  cyanocobalamin 1000 MCG tablet Take 1,000 mcg by mouth daily. Vitamin B12   Yes [provider]  fluticasone (FLONASE) 50 MCG/ACT nasal spray Place 2 sprays into both nostrils daily as needed for allergies or rhinitis.    Yes [provider]  insulin regular (NOVOLIN R,HUMULIN R) 100 units/mL injection Inject 2-8 Units into the skin 3 (three) times daily as needed for high blood sugar (CBG >150).    Yes [provider]  lactulose (CHRONULAC) 10 GM/15ML solution Take 45 mLs (30 g total) by mouth 2 (two) times daily. 11/04/17  Yes Shelly Coss, MD  levofloxacin (LEVAQUIN) 500 MG tablet Take 500 mg by mouth daily.  10/24/17  Yes [provider]  Multiple Vitamin (MULTIVITAMIN WITH MINERALS) TABS tablet Take 1 tablet by mouth daily.   Yes [provider]  nitroGLYCERIN (NITROSTAT) 0.4 MG SL tablet Place 1 tablet (0.4 mg total) under the tongue every 5 (five) minutes as needed for chest pain (chest pain). 07/19/16  Yes [name], NP  Potassium 99 MG TABS Take 99 mg by mouth daily.   Yes [provider]  ranitidine (ZANTAC) 150 MG tablet Take 150 mg by mouth 2 (two) times daily as needed for heartburn.    Yes [provider]  spironolactone  (ALDACTONE) 25 MG tablet Take 1 tablet (25 mg total) by mouth daily. 07/28/17  Yes Pyrtle, Lajuan Lines, MD  furosemide (LASIX) 20 MG tablet Take 1 tablet (20 mg total) by mouth daily. 12/22/17   Delsa Grana, PA-C     Allergies  Allergen Reactions  . Isosorbide Nitrate Other (See Comments)    Nose bleeds     Family History  Problem Relation Age of Onset  . Heart attack Brother 61       MI  . Diabetes Brother   . Heart attack Father   . Diabetes Father   . COPD Father   . COPD Mother   . Heart attack Brother   . Stroke Neg Hx      Social History   Socioeconomic History  . Marital status: Married    Spouse name: Not on file  . Number of children: 3  . Years of education: Not on file  . Highest education level: Not on file  Occupational History  . Occupation: Merchandiser, retail: OTHER    Comment: Worked in Theatre manager prior  Social Needs  . Financial resource strain: Not on file  . Food insecurity:    Worry: Not on file    Inability: Not on file  . Transportation needs:    Medical: Not on file    Non-medical: Not on file  Tobacco Use  . Smoking status: Current Every Day Smoker    Packs/day: 0.25    Years: 36.00    Pack years: 9.00    Types: E-cigarettes, Cigarettes  . Smokeless tobacco: Never Used  . Tobacco comment: uses vapor cigarettes (2016 ); I smoke about 4 cigarettes a day  Substance and Sexual Activity  . Alcohol use: No    Alcohol/week: 0.0 oz  . Drug use: No  . Sexual activity: Never  Lifestyle  . Physical activity:    Days per week: Not on file    Minutes per session: Not on file  . Stress: Not on file  Relationships  . Social connections:    Talks on phone: Not on file    Gets together: Not on file    Attends religious service: Not on file    Active member of club or organization: Not on file    Attends meetings of clubs or organizations: Not on file    Relationship status: Not on file  . Intimate partner violence:    Fear of current or ex  partner: Not on file    Emotionally abused: Not on file    Physically abused: Not on file    Forced sexual activity: Not on file  Other Topics Concern  . Not on file  Social History Narrative   Married   Gets regular exercise: walking     Review of Systems  Constitutional: Negative for activity change, appetite change, chills, diaphoresis, fatigue, fever and unexpected weight change.  HENT:  Negative.   Eyes: Negative.   Respiratory: Negative.  Negative for chest tightness and shortness of breath.   Cardiovascular: Negative.  Negative for chest pain, palpitations and leg swelling.  Gastrointestinal: Positive for diarrhea. Negative for abdominal distention, abdominal pain, constipation, nausea and vomiting.  Endocrine: Negative.   Genitourinary: Negative.   Musculoskeletal: Negative.   Skin: Negative.   Neurological: Negative.  Negative for dizziness, syncope, weakness and headaches.  Psychiatric/Behavioral: Positive for dysphoric mood. Negative for confusion, decreased concentration, sleep disturbance and suicidal ideas.  All other systems reviewed and are negative.      Objective:    Vitals:   12/22/17 1036  BP: 124/68  Pulse: 74  Resp: 18  Temp: 97.7 F (36.5 C)  TempSrc: Oral  SpO2: 98%  Weight: 253 lb (114.8 kg)  Height: 5' 6"  (1.676 m)      Physical Exam  Constitutional: He is oriented to person, place, and time. He appears well-developed and well-nourished.  Non-toxic appearance. He does not appear ill. No distress.  Chronically ill appearing male, alert & O x 3, NAD  HENT:  Head: Normocephalic and atraumatic.  Right Ear: Tympanic membrane, external ear and ear canal normal.  Left Ear: Tympanic membrane, external ear and ear canal normal.  Nose: Nose normal. No mucosal edema or rhinorrhea. Right sinus exhibits no maxillary sinus tenderness and no frontal sinus tenderness. Left sinus exhibits no maxillary sinus tenderness and no frontal sinus tenderness.    Mouth/Throat: Uvula is midline. No trismus in the jaw. No uvula swelling. No oropharyngeal exudate, posterior oropharyngeal edema or posterior oropharyngeal erythema.  Eyes: Pupils are equal, round, and reactive to light. Conjunctivae, EOM and lids are normal. No scleral icterus.  Neck: Trachea normal, normal range of motion and phonation normal. Neck supple. No JVD present. No tracheal deviation present.  Cardiovascular: Normal rate, regular rhythm, normal heart sounds and normal pulses. Exam reveals no gallop and no friction rub.  No murmur heard. Pulses:      Radial pulses are 2+ on the right side, and 2+ on the left side.       Posterior tibial pulses are 2+ on the right side, and 2+ on the left side.  Pulmonary/Chest: Effort normal and breath sounds normal. No stridor. No respiratory distress. He has no wheezes. He has no rhonchi. He has no rales.  Abdominal: Soft. Normal appearance and bowel sounds are normal. He exhibits no distension and no mass. There is no tenderness. There is no rebound and no guarding.  Abdomen soft, non-distended, non-tender  Musculoskeletal: Normal range of motion. He exhibits no edema.  Neurological: He is alert and oriented to person, place, and time. He displays no tremor. He exhibits normal muscle tone. He displays no seizure activity. Gait normal.  Walks with cane No asterixis   Skin: Skin is warm, dry and intact. Capillary refill takes less than 2 seconds. No rash noted. He is not diaphoretic.  Slightly ashy coloring, no jaundice  Psychiatric: He has a normal mood and affect. His speech is normal and behavior is normal. Judgment and thought content normal. Cognition and memory are normal. He expresses no homicidal and no suicidal ideation. He expresses no suicidal plans and no homicidal plans. He is attentive.  Nursing note and vitals reviewed.      Office Visit from 12/22/2017 in [facility]  PHQ-9 Total Score  18          Assessment  & Plan:  ICD-10-CM   1. Depression, unspecified depression type F32.9 Ambulatory referral to Psychiatry  2. Hepatic encephalopathy (HCC) K72.90 Ammonia    COMPLETE METABOLIC PANEL WITH GFR    CBC with Differential  3. Liver cirrhosis secondary to NASH (HCC) K75.81 Ammonia   B53.39 COMPLETE METABOLIC PANEL WITH GFR    CBC with Differential  4. Type 2 diabetes mellitus without complication, with long-term current use of insulin (HCC) E11.9 CBC with Differential   Z79.4   5. Elevated INR R79.1 Protime-INR  6. Hypokalemia P79.2 COMPLETE METABOLIC PANEL WITH GFR  7. Encounter to establish care with new doctor Z76.89     Pt new to me, he appears to have been discharged for saying inappropriate comments to staff at clinic. He has been appropriate here today.  Will redo labs, all most recent labs appear to be at baseline.  He is A&O x 3 From reviewing chart pt has some future ordered labs for GI procedure.  His instructions from the hospital and PCP said GI follow up, but he was unaware of this and has not been to GI.  He was instructed to call GI and discuss labs/plan prior to procedure.  Discussed counseling for pt, even if his wife will not come with him.  Referral put in, understandibly situational depression, pt denies HI, SI, AVH.   Delsa Grana, PA-C 12/22/17 11:53 AM

## 2017-12-22 NOTE — Patient Instructions (Signed)
[place] in the [facility]  Intensive Outpatient Programs: [facility]      [place]. [place], [place] Both a day and evening program       [facility] Outpatient     [address]        [place], [place] [address] [phone]         ADS: Alcohol & Drug Svcs [place]: [phone] or [phone] [address] [place], [place] [address] [email or web address]   Substance Abuse Resources: - Alcohol and Drug Services  [phone] - [facility] [phone] - The [place] [phone] - Residential & Outpatient Substance Abuse Program  [phone]  Psychological Services: - [place]  [place], [phone] [place]. [address], [place], [place]: [phone] or [phone], [email or web address]  Mobile Crisis Teams:                                        [facility]         Mobile Crisis Care Unit [phone]             [facility] Psych[facility] [address] Lady Gary Burns [address] [place] [phone]  Self-Help/Support Groups: [place]. of [employer] of support groups [phone] (call for more info)  Narcotics Anonymous (NA) Caring Services [address] [place] - 2 meetings at this location  Residential Treatment Programs:  [place]       [place] [address] [place], [place]  [address] [place]  [address] [place], [place] [address] [phone] Admissions: 8am-3pm M-F  Incentives Substance [place]     [address] [place], [place] [address]       [phone]         [facility] [address] Jadene Pierini [place], [place]  [facility] [address] [place], [place]  [facility] - Intensive Outpatient      [address]     [place], [place]         [facility] ([place]     [place], [place] or [phone]  Residential Treatment Services (RTS), Medicaid [place], [place]  [facility] [address]                                               [address] [place] [facility] [place] Resources: [place]  Human Services- [phone]               General Therapy                                                Domenic Schwab, PhD        [address] [place], [place] [address]         [facility]   [address] [place], [place] [address] [phone]  [facility] Recovery [address] [place], [place] [address] [phone] Insurance/Medicaid/sponsorship through [facility] and Families                                              [address]                                        [place], [place] [address]    Therapy/tele-psych/case         [place],   [address]  Adolescent/group home/case management [phone]                                           Rosette Reveal PhD       General therapy       Insurance   [phone]         Dr. Adele Schilder, Insurance, M-F [phone]  [facility] Dept. [address] [place] Phone:  [phone]                                  Phone:  [phone]                   Phone:  [place], [place], [address],  [phone], Insurance   Major Depressive Disorder, Adult Major depressive disorder (MDD) is a mental health condition. MDD often makes you feel sad, hopeless, or helpless. MDD can also cause symptoms in your body. MDD can affect your:  Work.  School.  Relationships.  Other normal activities.  MDD can range from mild to very bad. It may occur once (single episode MDD). It can also occur many times (recurrent MDD). The main symptoms of MDD often include:  Feeling sad, depressed, or irritable most of the time.  Loss of interest.  MDD symptoms also include:  Sleeping too much or too little.  Eating too much or too little.  A change in your weight.  Feeling tired (fatigue) or having low energy.  Feeling worthless.  Feeling guilty.  Trouble making decisions.  Trouble thinking clearly.  Thoughts of suicide or harming others.  Feeling weak.  Feeling agitated.  Keeping yourself from being around other people (isolation).  Follow these instructions at home: Activity  Do these things as told by your doctor: ? Go back to your normal activities. ? Exercise regularly. ? Spend time outdoors. Alcohol  Talk with your doctor about how alcohol can affect your antidepressant medicines.  Do not drink alcohol. Or, limit how much alcohol you drink. ? This means no more than 1 drink a day for nonpregnant women and 2 drinks a day for men. One drink equals one of these:  12 oz of beer.  5 oz of wine.  1 oz of hard liquor. General  instructions  Take over-the-counter and prescription medicines only as told by your doctor.  Eat a healthy diet.  Get plenty of sleep.  Find activities that you enjoy. Make time to do them.  Think about joining a support group. Your doctor may be able to suggest a group for you.  Keep all follow-up visits as told by your doctor. This is important. Where to find more information:  [employer] on Mental Illness: ? www.nami.[place]: ? https://carter.com/  National Suicide Prevention Lifeline: ? 819-616-3646. This is free, 24-hour help. Contact a doctor if:  Your symptoms get worse.  You have new symptoms. Get help right away if:  You self-harm.  You see, hear, taste, smell, or feel things that are not present (hallucinate). If you ever feel like you may hurt yourself or others, or have thoughts about taking your own life, get help right away. You can go to your nearest emergency department or call:  Your local emergency services (911 in the [place].S.).  A suicide crisis helpline, such as the National Suicide Prevention Lifeline: ? 8602021002. This is open 24 hours a day.  This information is not intended to replace advice given to you by your health care provider. Make sure you discuss any questions you have with your health care provider. Document Released: 06/08/2015 Document Revised: 03/13/2016 Document Reviewed: 03/13/2016 Elsevier Interactive Patient Education  2017 [place].  Hypokalemia Hypokalemia means that the amount of potassium in the blood is lower than normal.Potassium is a chemical that helps regulate the amount of fluid in the body (electrolyte). It also stimulates muscle tightening (contraction) and helps nerves work properly.Normally, most of the body's potassium is inside of cells, and only a very small amount is in the blood. Because the amount in the blood is so small, minor changes to potassium levels in  the blood can be life-threatening. What are the causes? This condition may be caused by:  Antibiotic  medicine.  Diarrhea or vomiting. Taking too much of a medicine that helps you have a bowel movement (laxative) can cause diarrhea and lead to hypokalemia.  Chronic kidney disease (CKD).  Medicines that help the body get rid of excess fluid (diuretics).  Eating disorders, such as bulimia.  Low magnesium levels in the body.  Sweating a lot.  What are the signs or symptoms? Symptoms of this condition include:  Weakness.  Constipation.  Fatigue.  Muscle cramps.  Mental confusion.  Skipped heartbeats or irregular heartbeat (palpitations).  Tingling or numbness.  How is this diagnosed? This condition is diagnosed with a blood test. How is this treated? Hypokalemia can be treated by taking potassium supplements by mouth or adjusting the medicines that you take. Treatment may also include eating more foods that contain a lot of potassium. If your potassium level is very low, you may need to get potassium through an IV tube in one of your veins and be monitored in the hospital. Follow these instructions at home:  Take over-the-counter and prescription medicines only as told by your health care provider. This includes vitamins and supplements.  Eat a healthy diet. A healthy diet includes fresh fruits and vegetables, whole grains, healthy fats, and lean proteins.  If instructed, eat more foods that contain a lot of potassium, such as: ? Nuts, such as peanuts and pistachios. ? Seeds, such as sunflower seeds and pumpkin seeds. ? Peas, lentils, and lima beans. ? Whole grain and bran cereals and breads. ? Fresh fruits and vegetables, such as apricots, avocado, bananas, cantaloupe, kiwi, oranges, tomatoes, asparagus, and potatoes. ? Orange juice. ? Tomato juice. ? Red meats. ? Yogurt.  Keep all follow-up visits as told by your health care provider. This is important. Contact a  health care provider if:  You have weakness that gets worse.  You feel your heart pounding or racing.  You vomit.  You have diarrhea.  You have diabetes (diabetes mellitus) and you have trouble keeping your blood sugar (glucose) in your target range. Get help right away if:  You have chest pain.  You have shortness of breath.  You have vomiting or diarrhea that lasts for more than 2 days.  You faint. This information is not intended to replace advice given to you by your health care provider. Make sure you discuss any questions you have with your health care provider. Document Released: 06/27/2005 Document Revised: 02/13/2016 Document Reviewed: 02/13/2016 Elsevier Interactive Patient Education  2018 [employer].   Liver Failure Liver failure is a condition in which the liver loses its ability to function due to injury or disease. The liver is a large organ in the upper right-hand side of the abdomen. It is involved in many important functions, including storing energy, producing fluids that the body needs, and removing harmful substances from the bloodstream. Liver failure can develop quickly, over days or weeks (acute liver failure). It can also develop gradually, over months or years (chronicliver failure). What are the causes? There are many possible causes of liver failure, including:  Liver infection (viral hepatitis).  Alcohol abuse.  An overdose of certain medicines. Acetaminophen overdose is a common cause.  Liver disease.  Ingesting poison from mushrooms or mold.  What are the signs or symptoms? Symptoms of this condition may include:  Yellowing of the skin and the whites of the eyes (jaundice).  Bruising easily.  Persistent bleeding.  Itchy skin (pruritus).  Red, spider-like lines on the skin.  Loss of appetite.  Nausea.  Vomiting blood.  Bloody bowel movements.  Weight loss.  Muscle loss.  Jerky or floppy muscle movements, especially  with hand movements (asterixis).  Fluid buildup in the belly (ascites).  Decreased urination. This may be a sign that your kidneys have stopped working (renal failure).  Confusion and sleepiness.  Difficulty sleeping.  Mood and personality changes.  Frequent infections.  Breath that smells sweet or musty.  Difficulty breathing.  How is this diagnosed? This condition is diagnosed based on your symptoms, your medical history, and a physical exam. You may have tests, including:  Blood tests.  CT scan.  MRI.  Ultrasound.  Removal of a small amount of liver tissue to be examined under a microscope (biopsy).  How is this treated? Treatment depends on the cause and severity of your condition. Treatment for chronic liver failure may include:  Medicines to help reduce symptoms.  Lifestyle changes, such as limiting salt and animal proteins in your diet. Foods that contain animal proteins include red meat, fish, and dairy products.  Acute or advanced (end stage) liver failure may require hospitalization. Treatment may include:  Antibiotic medicine.  IV fluids that contain sugar (glucose) and minerals (electrolytes).  Flushing out toxic substances from the body using medicine (lactulose) or a cleansing procedure (enema).  Adding certain amounts of the liquid part of blood (plasma) to your bloodstream (receiving a transfusion).  Using an artificial kidney to filter your blood (hemodialysis) if you have renal failure.  Breathing support and a breathing tube (respirator).  Liver transplant. This is a surgery to replace your liver with another person's liver (donor liver). This may be the best option if your liver has completely stopped functioning.  Follow these instructions at home:  Take over-the-counter and prescription medicines only as told by your health care provider.  Do not drink alcohol.  Do not use tobacco products, including cigarettes, chewing tobacco, or  e-cigarettes. If you need help quitting, ask your health care provider.  Follow instructions from your health care provider about eating and drinking restrictions. This may include: ? Limiting the amount of animal protein that you eat. ? Increasing the amount of plant-based protein that you eat. Foods that contain plant-based proteins include whole grains, nuts, and vegetables. ? Taking vitamin supplements. ? Limiting the amount of salt that you eat.  Follow instructions from your health care provider about maintaining your vaccinations, especially vaccinations against hepatitis A and B.  Exercise regularly, as told by your health care provider.  Keep all follow-up visits as told by your health care provider. This is important. Contact a health care provider if:  You have symptoms that get worse.  You lose a lot of weight without trying.  You have a fever or chills. Get help right away if:  You become confused or very sleepy.  You cannot take care of yourself or be taken care of at home.  You are not urinating.  You have difficulty breathing.  You vomit blood. This information is not intended to replace advice given to you by your health care provider. Make sure you discuss any questions you have with your health care provider. Document Released: 03/18/2015 Document Revised: 12/03/2015 Document Reviewed: 07/16/2014 Elsevier Interactive Patient Education  [employer].

## 2017-12-22 NOTE — Progress Notes (Deleted)
Patient ID: Adam Butler, male    DOB: 01/21/1956, 62 y.o.   MRN: [id number]  PCP: Delsa Grana, PA-C  Chief Complaint  Patient presents with  . Hospitalization Follow-up    pt is taking Levaquin 562m QD.... I asked pt if he had a preference to which arm he would prefer me to use for blood pressure check and proceeded to tell me that I could use his "middle arm"    Subjective:   Adam Butler a 62y.o. male, presents to clinic with CC of     Patient Active Problem List   Diagnosis Date Noted  . Chronic diastolic CHF (congestive heart failure) ([place]   . Thoracic ascending aortic aneurysm ([place]   . OA (osteoarthritis) of knee 07/19/2016  . Hepatic encephalopathy ([place] 12/16/2015  . GERD (gastroesophageal reflux disease) 09/29/2015  . Type 2 diabetes mellitus ([facility] 08/18/2015  . CAD in native artery   . Liver cirrhosis secondary to NASH ([place] 06/06/2015  . Tobacco abuse 05/04/2015  . PFO (patent foramen ovale): Per TEE 04/20/2015 04/20/2015  . Esophageal varices without bleeding ([place] 08/16/2010     Prior to Admission medications   Medication Sig Start Date End Date Taking? Authorizing Provider  aspirin EC 81 MG tablet Take 81 mg by mouth daily.   Yes [provider]  atorvastatin (LIPITOR) 10 MG tablet Take 10 mg by mouth daily.   Yes [provider]  cyanocobalamin 1000 MCG tablet Take 1,000 mcg by mouth daily. Vitamin B12   Yes [provider]  fluticasone (FLONASE) 50 MCG/ACT nasal spray Place 2 sprays into both nostrils daily as needed for allergies or rhinitis.    Yes [provider]  furosemide (LASIX) 20 MG tablet Take 1 tablet (20 mg total) by mouth daily. 12/22/17  Yes TDelsa Grana PA-C  insulin regular (NOVOLIN R,HUMULIN R) 100 units/mL injection Inject 2-8 Units into the skin 3 (three) times daily as needed for high blood sugar (CBG >150).    Yes [provider]  lactulose (CHRONULAC) 10 GM/15ML solution  Take 45 mLs (30 g total) by mouth 2 (two) times daily. 11/04/17  Yes AShelly Coss MD  levofloxacin (LEVAQUIN) 500 MG tablet Take 500 mg by mouth daily.  10/24/17  Yes [provider]  Multiple Vitamin (MULTIVITAMIN WITH MINERALS) TABS tablet Take 1 tablet by mouth daily.   Yes [provider]  nitroGLYCERIN (NITROSTAT) 0.4 MG SL tablet Place 1 tablet (0.4 mg total) under the tongue every 5 (five) minutes as needed for chest pain (chest pain). 07/19/16  Yes BJearld Fenton NP  Potassium 99 MG TABS Take 99 mg by mouth daily.   Yes [provider]  ranitidine (ZANTAC) 150 MG tablet Take 150 mg by mouth 2 (two) times daily as needed for heartburn.    Yes [provider]  spironolactone (ALDACTONE) 25 MG tablet Take 1 tablet (25 mg total) by mouth daily. 07/28/17  Yes Pyrtle, JLajuan Lines MD  atorvastatin (LIPITOR) 10 MG tablet TAKE 1 TABLET BY MOUTH ONCE DAILY 11/21/17   BJearld Fenton NP     Allergies  Allergen Reactions  . Isosorbide Nitrate Other (See Comments)    Nose bleeds     Family History  Problem Relation Age of Onset  . Heart attack Brother 573      MI  . Diabetes Brother   . Heart attack Father   . Diabetes Father   . COPD Father   .  COPD Mother   . Heart attack Brother   . Stroke Neg Hx      Social History   Socioeconomic History  . Marital status: Married    Spouse name: Not on file  . Number of children: 3  . Years of education: Not on file  . Highest education level: Not on file  Occupational History  . Occupation: Merchandiser, retail: OTHER    Comment: Worked in Theatre manager prior  Social Needs  . Financial resource strain: Not on file  . Food insecurity:    Worry: Not on file    Inability: Not on file  . Transportation needs:    Medical: Not on file    Non-medical: Not on file  Tobacco Use  . Smoking status: Current Every Day Smoker    Packs/day: 0.25    Years: 36.00    Pack years: 9.00    Types: E-cigarettes,  Cigarettes  . Smokeless tobacco: Never Used  . Tobacco comment: uses vapor cigarettes (2016 ); I smoke about 4 cigarettes a day  Substance and Sexual Activity  . Alcohol use: No    Alcohol/week: 0.0 oz  . Drug use: No  . Sexual activity: Never  Lifestyle  . Physical activity:    Days per week: Not on file    Minutes per session: Not on file  . Stress: Not on file  Relationships  . Social connections:    Talks on phone: Not on file    Gets together: Not on file    Attends religious service: Not on file    Active member of club or organization: Not on file    Attends meetings of clubs or organizations: Not on file    Relationship status: Not on file  . Intimate partner violence:    Fear of current or ex partner: Not on file    Emotionally abused: Not on file    Physically abused: Not on file    Forced sexual activity: Not on file  Other Topics Concern  . Not on file  Social History Narrative   Married   Gets regular exercise: walking     Review of Systems     Objective:    Vitals:   11/14/17 1545  BP: 122/64  Pulse: 82  Temp: 98.1 F (36.7 C)  TempSrc: Oral  SpO2: 98%  Weight: 265 lb (120.2 kg)      Physical Exam        Assessment & Plan:      ICD-10-CM   1. Hepatic encephalopathy (HCC) K72.90 CBC    Comprehensive metabolic panel    Ammonia    TIQ-MISC    CANCELED: Ammonia  2. Liver cirrhosis secondary to NASH ([place]) K75.81    K74.60   3. Sprain of right ankle, unspecified ligament, initial encounter S93.401A        Delsa Grana, PA-C 12/22/17 11:05 AM

## 2017-12-22 NOTE — Addendum Note (Signed)
Addended by: Delsa Grana on: 12/22/2017 12:33 PM   Modules accepted: Orders

## 2017-12-23 LAB — COMPLETE METABOLIC PANEL WITH GFR
AG Ratio: 0.7 (calc) — ABNORMAL LOW (ref 1.0–2.5)
ALKALINE PHOSPHATASE (APISO): 200 U/L — AB (ref 40–115)
ALT: 35 U/L (ref 9–46)
AST: 64 U/L — AB (ref 10–35)
Albumin: 2.6 g/dL — ABNORMAL LOW (ref 3.6–5.1)
BUN: 10 mg/dL (ref 7–25)
CALCIUM: 8.4 mg/dL — AB (ref 8.6–10.3)
CO2: 25 mmol/L (ref 20–32)
CREATININE: 0.77 mg/dL (ref 0.70–1.25)
Chloride: 106 mmol/L (ref 98–110)
GFR, EST NON AFRICAN AMERICAN: 98 mL/min/{1.73_m2} (ref >=60)
GFR, Est African American: 114 mL/min/{1.73_m2} (ref >=60)
Globulin: 4 g/dL (calc) — ABNORMAL HIGH (ref 1.9–3.7)
Glucose, Bld: 150 mg/dL — ABNORMAL HIGH (ref 65–99)
POTASSIUM: 3.8 mmol/L (ref 3.5–5.3)
Sodium: 137 mmol/L (ref 135–146)
Total Bilirubin: 2.4 mg/dL — ABNORMAL HIGH (ref 0.2–1.2)
Total Protein: 6.6 g/dL (ref 6.1–8.1)

## 2017-12-23 LAB — CBC WITH DIFFERENTIAL/PLATELET
BASOS PCT: 0.8 %
Basophils Absolute: 38 cells/uL (ref 0–200)
EOS ABS: 187 {cells}/uL (ref 15–500)
Eosinophils Relative: 3.9 %
HCT: 35.7 % — ABNORMAL LOW (ref 38.5–50.0)
Hemoglobin: 12.3 g/dL — ABNORMAL LOW (ref 13.2–17.1)
Lymphs Abs: 874 cells/uL (ref 850–3900)
MCH: 34 pg — ABNORMAL HIGH (ref 27.0–33.0)
MCHC: 34.5 g/dL (ref 32.0–36.0)
MCV: 98.6 fL (ref 80.0–100.0)
MONOS PCT: 8.9 %
MPV: 11.8 fL (ref 7.5–12.5)
NEUTROS ABS: 3274 {cells}/uL (ref 1500–7800)
Neutrophils Relative %: 68.2 %
PLATELETS: 43 10*3/uL — AB (ref 140–400)
RBC: 3.62 10*6/uL — AB (ref 4.20–5.80)
RDW: 13.9 % (ref 11.0–15.0)
TOTAL LYMPHOCYTE: 18.2 %
WBC: 4.8 10*3/uL (ref 3.8–10.8)
WBCMIX: 427 {cells}/uL (ref 200–950)

## 2017-12-23 LAB — PROTIME-INR
INR: 1.5 — AB
PROTHROMBIN TIME: 16.2 s — AB (ref 9.0–11.5)

## 2017-12-23 LAB — AMMONIA: Ammonia: 89 umol/L — ABNORMAL HIGH (ref <=72)

## 2017-12-26 MED FILL — levoFLOXacin 500 MG TABS: 500 | 30 days supply | Qty: 30 | Fill #5

## 2017-12-27 ENCOUNTER — Telehealth: Payer: Self-pay | Admitting: Internal Medicine

## 2017-12-27 MED ORDER — CLENPIQ 10-3.5-12 MG-GM -GM/160ML PO SOLN
1.0000 | ORAL | 0 refills | Status: DC
Start: 2017-12-27 — End: 2017-12-27

## 2017-12-27 MED ORDER — CLENPIQ 10-3.5-12 MG-GM -GM/160ML PO SOLN: 1.0000 | ORAL | 0 refills | Status: DC

## 2017-12-27 MED FILL — CLENPIQ 10-3.5-12 MG-GM -GM: 10-3.5-12 M | 1 days supply | Qty: 320 | Fill #0

## 2017-12-27 NOTE — Telephone Encounter (Signed)
Rx sent 

## 2018-01-01 ENCOUNTER — Encounter (HOSPITAL_COMMUNITY): Payer: Self-pay | Admitting: *Deleted

## 2018-01-02 ENCOUNTER — Ambulatory Visit (HOSPITAL_COMMUNITY): Payer: PPO | Admitting: Anesthesiology

## 2018-01-02 ENCOUNTER — Ambulatory Visit (HOSPITAL_COMMUNITY)
Admission: RE | Admit: 2018-01-02 | Discharge: 2018-01-02 | Disposition: A | Discharge status: Home / Self Care | Payer: PPO | Source: Ambulatory Visit | Attending: Internal Medicine | Admitting: Internal Medicine

## 2018-01-02 ENCOUNTER — Encounter: Payer: PPO | Admitting: Internal Medicine

## 2018-01-02 ENCOUNTER — Other Ambulatory Visit: Payer: Self-pay

## 2018-01-02 ENCOUNTER — Encounter (HOSPITAL_COMMUNITY): Payer: Self-pay | Admitting: Anesthesiology

## 2018-01-02 ENCOUNTER — Encounter (HOSPITAL_COMMUNITY)
Admission: RE | Disposition: A | Discharge status: Home / Self Care | Payer: Self-pay | Source: Ambulatory Visit | Attending: Internal Medicine

## 2018-01-02 DIAGNOSIS — K7460 Unspecified cirrhosis of liver: Secondary | ICD-10-CM | POA: Diagnosis not present

## 2018-01-02 DIAGNOSIS — I8510 Secondary esophageal varices without bleeding: Secondary | ICD-10-CM | POA: Diagnosis not present

## 2018-01-02 DIAGNOSIS — I252 Old myocardial infarction: Secondary | ICD-10-CM | POA: Diagnosis not present

## 2018-01-02 DIAGNOSIS — F17210 Nicotine dependence, cigarettes, uncomplicated: Secondary | ICD-10-CM | POA: Diagnosis not present

## 2018-01-02 DIAGNOSIS — Z1211 Encounter for screening for malignant neoplasm of colon: Secondary | ICD-10-CM | POA: Insufficient documentation

## 2018-01-02 DIAGNOSIS — Z955 Presence of coronary angioplasty implant and graft: Secondary | ICD-10-CM | POA: Insufficient documentation

## 2018-01-02 DIAGNOSIS — Z86010 Personal history of colonic polyps: Secondary | ICD-10-CM | POA: Insufficient documentation

## 2018-01-02 DIAGNOSIS — Z8249 Family history of ischemic heart disease and other diseases of the circulatory system: Secondary | ICD-10-CM | POA: Diagnosis not present

## 2018-01-02 DIAGNOSIS — K529 Noninfective gastroenteritis and colitis, unspecified: Secondary | ICD-10-CM | POA: Diagnosis not present

## 2018-01-02 DIAGNOSIS — I2510 Atherosclerotic heart disease of native coronary artery without angina pectoris: Secondary | ICD-10-CM | POA: Insufficient documentation

## 2018-01-02 DIAGNOSIS — I8500 Esophageal varices without bleeding: Secondary | ICD-10-CM | POA: Diagnosis not present

## 2018-01-02 DIAGNOSIS — F17290 Nicotine dependence, other tobacco product, uncomplicated: Secondary | ICD-10-CM | POA: Diagnosis not present

## 2018-01-02 DIAGNOSIS — E119 Type 2 diabetes mellitus without complications: Secondary | ICD-10-CM | POA: Insufficient documentation

## 2018-01-02 DIAGNOSIS — D122 Benign neoplasm of ascending colon: Secondary | ICD-10-CM | POA: Diagnosis not present

## 2018-01-02 DIAGNOSIS — K3189 Other diseases of stomach and duodenum: Secondary | ICD-10-CM | POA: Diagnosis not present

## 2018-01-02 DIAGNOSIS — K5730 Diverticulosis of large intestine without perforation or abscess without bleeding: Secondary | ICD-10-CM | POA: Diagnosis not present

## 2018-01-02 DIAGNOSIS — K2980 Duodenitis without bleeding: Secondary | ICD-10-CM | POA: Diagnosis not present

## 2018-01-02 DIAGNOSIS — D120 Benign neoplasm of cecum: Secondary | ICD-10-CM | POA: Diagnosis not present

## 2018-01-02 DIAGNOSIS — K7581 Nonalcoholic steatohepatitis (NASH): Secondary | ICD-10-CM | POA: Insufficient documentation

## 2018-01-02 DIAGNOSIS — I5032 Chronic diastolic (congestive) heart failure: Secondary | ICD-10-CM | POA: Diagnosis not present

## 2018-01-02 DIAGNOSIS — K766 Portal hypertension: Secondary | ICD-10-CM

## 2018-01-02 DIAGNOSIS — K5140 Inflammatory polyps of colon without complications: Secondary | ICD-10-CM | POA: Diagnosis not present

## 2018-01-02 HISTORY — PX: ESOPHAGOGASTRODUODENOSCOPY (EGD) WITH PROPOFOL: SHX5813

## 2018-01-02 HISTORY — PX: BIOPSY: SHX5522

## 2018-01-02 HISTORY — PX: POLYPECTOMY: SHX5525

## 2018-01-02 HISTORY — PX: COLONOSCOPY WITH PROPOFOL: SHX5780

## 2018-01-02 LAB — GLUCOSE, CAPILLARY: Glucose-Capillary: 194 mg/dL — ABNORMAL HIGH (ref 70–99)

## 2018-01-02 SURGERY — ESOPHAGOGASTRODUODENOSCOPY (EGD) WITH PROPOFOL
Anesthesia: Monitor Anesthesia Care

## 2018-01-02 MED ORDER — LACTATED RINGERS IV SOLN
INTRAVENOUS | Status: DC
  Administered 2018-01-02: 09:00:00 via INTRAVENOUS

## 2018-01-02 MED ORDER — PROPOFOL 500 MG/50ML IV EMUL
INTRAVENOUS | Status: DC | PRN
  Administered 2018-01-02: 30 mg via INTRAVENOUS

## 2018-01-02 MED ORDER — MIDAZOLAM HCL 5 MG/5ML IJ SOLN
INTRAMUSCULAR | Status: DC | PRN
  Administered 2018-01-02: 2 mg via INTRAVENOUS

## 2018-01-02 MED ORDER — PROPOFOL 10 MG/ML IV BOLUS
INTRAVENOUS | Status: AC
  Filled 2018-01-02: qty 40

## 2018-01-02 MED ORDER — PROPOFOL 10 MG/ML IV BOLUS
INTRAVENOUS | Status: AC
  Filled 2018-01-02: qty 60

## 2018-01-02 MED ORDER — EPHEDRINE SULFATE 50 MG/ML IJ SOLN
INTRAMUSCULAR | Status: DC | PRN
  Administered 2018-01-02 (×3): 5 mg via INTRAVENOUS

## 2018-01-02 MED ORDER — PROPOFOL 500 MG/50ML IV EMUL
INTRAVENOUS | Status: DC | PRN
  Administered 2018-01-02: 125 ug/kg/min via INTRAVENOUS

## 2018-01-02 MED ORDER — MIDAZOLAM HCL 2 MG/2ML IJ SOLN
INTRAMUSCULAR | Status: AC
  Filled 2018-01-02: qty 2

## 2018-01-02 MED ORDER — FLUCONAZOLE 100 MG PO TABS: 100.0000 mg | ORAL_TABLET | Freq: Every day | ORAL | 0 refills | Status: AC

## 2018-01-02 SURGICAL SUPPLY — 25 items

## 2018-01-02 NOTE — Op Note (Signed)
[facility] Patient Name: Adam Butler Procedure Date: 01/02/2018 MRN: [id number] Attending MD: Jerene Bears , MD Date of Birth: 07-Jun-1956 CSN: [id number] Age: 62 Admit Type: Outpatient Procedure:                Colonoscopy Indications:              High risk colon cancer surveillance: Personal                            history of colonic polyps, history of polyp in                            ascending colon not removed at colonoscopy in Sept                            2018 due to Strep Bovis bacteremia and colitis. Providers:                Lajuan Lines. Hilarie Fredrickson, MD, Angus Seller, Charolette Child,                            Technician, Arnoldo Hooker, CRNA Referring MD:              Medicines:                Monitored Anesthesia Care Complications:            No immediate complications. Estimated Blood Loss:     Estimated blood loss was minimal. Procedure:                Pre-Anesthesia Assessment:                           - Prior to the procedure, a History and Physical                            was performed, and patient medications and                            allergies were reviewed. The patient's tolerance of                            previous anesthesia was also reviewed. The risks                            and benefits of the procedure and the sedation                            options and risks were discussed with the patient.                            All questions were answered, and informed consent                            was obtained. Prior Anticoagulants: The patient has  taken no previous anticoagulant or antiplatelet                            agents. ASA Grade Assessment: III - A patient with                            severe systemic disease. After reviewing the risks                            and benefits, the patient was deemed in                            satisfactory condition to undergo the procedure.             After obtaining informed consent, the colonoscope                            was passed under direct vision. Throughout the                            procedure, the patient's blood pressure, pulse, and                            oxygen saturations were monitored continuously. The                            EC-[id number] ([id number]) scope was introduced through                            the anus and advanced to the cecum, identified by                            appendiceal orifice and ileocecal valve. The                            colonoscopy was performed without difficulty. The                            patient tolerated the procedure well. The quality                            of the bowel preparation was good. The ileocecal                            valve, appendiceal orifice, and rectum were                            photographed. Scope In: 10:54:54 AM Scope Out: 11:22:18 AM Scope Withdrawal Time: 0 hours 23 minutes 1 second  Total Procedure Duration: 0 hours 27 minutes 24 seconds  Findings:      A 3 mm polyp was found in the cecum. The polyp was sessile. The polyp       was removed with a cold biopsy forceps. Resection and retrieval were       complete.  A 10 mm polyp was found in the ascending colon. The polyp was sessile.       The polyp was removed with a hot snare. Resection and retrieval were       complete. To prevent bleeding after the polypectomy, two hemostatic       clips were successfully placed (MR conditional). There was no bleeding       during, or at the end, of the procedure.      Scattered mild inflammation characterized by erosions and erythema was       found in the sigmoid colon, in the descending colon and in the       transverse colon. Biopsies were taken with a cold forceps for histology.      Multiple small-mouthed diverticula were found in the sigmoid colon.      Small, non-bleeding rectal varices were found.      Internal hemorrhoids were  found during retroflexion. The hemorrhoids       were small. Impression:               - One 3 mm polyp in the cecum, removed with a cold                            biopsy forceps. Resected and retrieved.                           - One 10 mm polyp in the ascending colon, removed                            with a hot snare. Resected and retrieved. Clips (MR                            conditional) were placed.                           - Scattered mild inflammation was found in the                            sigmoid colon, in the descending colon and in the                            transverse colon, rule out inflammatory bowel                            disease and rule out ischemic colitis. Biopsied.                           - Diverticulosis in the sigmoid colon.                           - Small rectal varices.                           - Small internal hemorrhoids. Moderate Sedation:      N/A Recommendation:           - Patient has a contact number available for  emergencies. The signs and symptoms of potential                            delayed complications were discussed with the                            patient. Return to normal activities tomorrow.                            Written discharge instructions were provided to the                            patient.                           - Low sodium diet.                           - Continue present medications.                           - Await pathology results.                           - Repeat colonoscopy for surveillance based on                            pathology results.                           - No ibuprofen, naproxen, or other non-steroidal                            anti-inflammatory drugs. Procedure Code(s):        --- Professional ---                           [phone], Colonoscopy, flexible; with removal of                            tumor(s), polyp(s), or other lesion(s) by snare                             technique                           45380, 108, Colonoscopy, flexible; with biopsy,                            single or multiple Diagnosis Code(s):        --- Professional ---                           K64.8, Other hemorrhoids                           Z86.010, Personal history of colonic polyps  D12.0, Benign neoplasm of cecum                           D12.2, Benign neoplasm of ascending colon                           K52.9, Noninfective gastroenteritis and colitis,                            unspecified                           K57.30, Diverticulosis of large intestine without                            perforation or abscess without bleeding CPT copyright 2017 American Medical Association. All rights reserved. The codes documented in this report are preliminary and upon coder review may  be revised to meet current compliance requirements. Jerene Bears, MD 01/02/2018 12:03:34 PM This report has been signed electronically. Number of Addenda: 0

## 2018-01-02 NOTE — Anesthesia Preprocedure Evaluation (Addendum)
Anesthesia Evaluation  Patient identified by MRN, date of birth, ID band Patient awake    Reviewed: Allergy & Precautions, NPO status , Patient's Chart, lab work & pertinent test results  Airway Mallampati: II  TM Distance: >3 FB Neck ROM: Full    Dental  (+) Dental Advisory Given, Missing   Pulmonary Current Smoker,    Pulmonary exam normal breath sounds clear to auscultation       Cardiovascular + CAD, + Past MI and + Cardiac Stents  Normal cardiovascular exam Rhythm:Regular Rate:Normal  PFO   Neuro/Psych negative neurological ROS  negative psych ROS   GI/Hepatic negative GI ROS, Neg liver ROS,   Endo/Other  diabetes  Renal/GU negative Renal ROS  negative genitourinary   Musculoskeletal negative musculoskeletal ROS (+)   Abdominal   Peds negative pediatric ROS (+)  Hematology negative hematology ROS (+)   Anesthesia Other Findings   Reproductive/Obstetrics negative OB ROS                            Anesthesia Physical Anesthesia Plan  ASA: III  Anesthesia Plan: MAC   Post-op Pain Management:    Induction: Intravenous  PONV Risk Score and Plan: 0 and Treatment may vary due to age or medical condition  Airway Management Planned: Simple Face Mask  Additional Equipment:   Intra-op Plan:   Post-operative Plan:   Informed Consent: I have reviewed the patients History and Physical, chart, labs and discussed the procedure including the risks, benefits and alternatives for the proposed anesthesia with the patient or authorized representative who has indicated his/her understanding and acceptance.   Dental advisory given  Plan Discussed with: CRNA and Surgeon  Anesthesia Plan Comments:         Anesthesia Quick Evaluation

## 2018-01-02 NOTE — Anesthesia Postprocedure Evaluation (Signed)
Anesthesia Post Note  Patient: Adam Butler  Procedure(s) Performed: ESOPHAGOGASTRODUODENOSCOPY (EGD) WITH PROPOFOL (N/A ) COLONOSCOPY WITH PROPOFOL (N/A ) POLYPECTOMY BIOPSY     Patient location during evaluation: PACU Anesthesia Type: MAC Level of consciousness: awake and alert Pain management: pain level controlled Vital Signs Assessment: post-procedure vital signs reviewed and stable Respiratory status: spontaneous breathing, nonlabored ventilation, respiratory function stable and patient connected to nasal cannula oxygen Cardiovascular status: stable and blood pressure returned to baseline Postop Assessment: no apparent nausea or vomiting Anesthetic complications: no    Last Vitals:  Vitals:   01/02/18 0859 01/02/18 1139  BP: (!) 136/50 (!) 112/50  Pulse: 81 76  Resp: 15 19  Temp: 36.5 C 36.6 C  SpO2: 100% 98%    Last Pain:  Vitals:   01/02/18 1150  TempSrc:   PainSc: 0-No pain                 Jaline Pincock S

## 2018-01-02 NOTE — H&P (Signed)
HPI: Adam Butler is a 62 year old male with a history of NASH cirrhosis with portal hypertension with history of ascites, esophageal varices, hepatic encephalopathy, history of SBP, history of colon polyps who presents for outpatient upper endoscopy and colonoscopy.  He is here today with his wife.  Reports he has been feeling well.  Denies abdominal pain.  Denies recent confusion.  Denies increasing abdominal girth.  He is taking his lactulose and diuretic therapy.  Tries to avoid salt.  Has been off of rifaximin and on lactulose only.  Denies bleeding.  He had a strep bovis bacteremia and colonoscopy in September 2018 with Dr. Loletha Carrow.  This revealed an acute colitis and a 10 mm ascending colon polyp.  The colon polyp was not removed due to bacteremia and colitis.  Past Medical History:  Diagnosis Date  . Arthritis   . Cholelithiasis   . Cirrhosis ([place]) 2011   Cryptogenic, Likely NASH. Family/pt deny EtOH. HCV, HBV, HAV negative. ANA negative. AMA positive. Ascites 12/11  . Coronary artery disease    Inferior MI 12/11; LHC with occluded mid CFX and 80% proximal RCA. EF 55%. He had 3.0 x 28 vision BMS to CFX  . Diastolic CHF, acute ([place])    Echo 12/11 with ef 50-55% and mild LVH. EF 55% by LV0gram in 12/11  . Esophageal varices ([place]) 2011, 2013   no hx acute variceal bleed  . Hepatic encephalopathy ([place]) 2011, 12/2013  . Hyperlipemia   . Myocardial infarction [facility]) ? 2012  . PFO (patent foramen ovale): Per TEE 04/20/2015 04/20/2015  . Portal hypertension ([place])   . Rectal varices   . S/P coronary artery stent placement 06/2010  . SVT (supraventricular tachycardia) ([place])    1/12: appeared to be an ectopic atrial tachycardia. Required DCCV with hemodynamic instability  . Type II diabetes mellitus ([place]) 2011    Past Surgical History:  Procedure Laterality Date  . APPENDECTOMY    . CARDIAC CATHETERIZATION  07/01/2010   BMS to CFX.  Marland Kitchen CATARACT EXTRACTION W/PHACO Left 04/28/2016   Procedure: CATARACT EXTRACTION PHACO AND INTRAOCULAR LENS PLACEMENT (IOC);  Surgeon: Leandrew Koyanagi, MD;  Location: [facility] ORS;  Service: Ophthalmology;  Laterality: Left;  Lot# [id number] H [place]: 05:31.7 AP%: 28.2 CDE: 93.48   . COLONOSCOPY  04/13/2012   Procedure: COLONOSCOPY;  Surgeon: Inda Castle, MD;  Location: [facility] ENDOSCOPY;  Service: Endoscopy;  Laterality: N/A;  . COLONOSCOPY WITH PROPOFOL N/A 03/22/2017   Procedure: COLONOSCOPY WITH PROPOFOL;  Surgeon: Doran Stabler, MD;  Location: [facility] ENDOSCOPY;  Service: Gastroenterology;  Laterality: N/A;  . CORONARY ANGIOPLASTY    . CORONARY STENT PLACEMENT  06/30/2010   CFX   Distal        . ESOPHAGEAL BANDING N/A 01/01/2014   Procedure: ESOPHAGEAL BANDING;  Surgeon: Jerene Bears, MD;  Location: [facility];  Service: Endoscopy;  Laterality: N/A;  . ESOPHAGOGASTRODUODENOSCOPY  04/13/2012   Procedure: ESOPHAGOGASTRODUODENOSCOPY (EGD);  Surgeon: Inda Castle, MD;  Location: [facility];  Service: Endoscopy;  Laterality: N/A;  . ESOPHAGOGASTRODUODENOSCOPY N/A 01/01/2014   Procedure: ESOPHAGOGASTRODUODENOSCOPY (EGD);  Surgeon: Jerene Bears, MD;  Location: [facility] ENDOSCOPY;  Service: Endoscopy;  Laterality: N/A;  . ESOPHAGOGASTRODUODENOSCOPY (EGD) WITH PROPOFOL N/A 07/12/2016   Procedure: ESOPHAGOGASTRODUODENOSCOPY (EGD) WITH PROPOFOL;  Surgeon: Jerene Bears, MD;  Location: [facility] ENDOSCOPY;  Service: Gastroenterology;  Laterality: N/A;  . LEFT HEART CATHETERIZATION WITH CORONARY ANGIOGRAM N/A 10/15/2014   Procedure: LEFT HEART CATHETERIZATION WITH CORONARY ANGIOGRAM;  Surgeon: Peter M Martinique,  MD;  Location: [facility];  Service: Cardiovascular;  Laterality: N/A;  . orif r leg    . TEE WITHOUT CARDIOVERSION N/A 04/20/2015   Procedure: TRANSESOPHAGEAL ECHOCARDIOGRAM (TEE);  Surgeon: Fay Records, MD;  Location: [facility];  Service: Cardiovascular;  Laterality: N/A;  . TEE WITHOUT CARDIOVERSION N/A 05/17/2017   Procedure: TRANSESOPHAGEAL ECHOCARDIOGRAM  (TEE);  Surgeon: Josue Hector, MD;  Location: [facility] ENDOSCOPY;  Service: Cardiovascular;  Laterality: N/A;     (Not in an outpatient encounter)  Allergies  Allergen Reactions  . Isosorbide Nitrate Other (See Comments)    Nose bleeds    Family History  Problem Relation Age of Onset  . Heart attack Brother 23       MI  . Diabetes Brother   . Heart attack Father   . Diabetes Father   . COPD Father   . COPD Mother   . Heart attack Brother   . Stroke Neg Hx     Social History   Tobacco Use  . Smoking status: Current Every Day Smoker    Packs/day: 0.25    Years: 36.00    Pack years: 9.00    Types: E-cigarettes, Cigarettes  . Smokeless tobacco: Never Used  . Tobacco comment: uses vapor cigarettes (2016 ); I smoke about 4 cigarettes a day  Substance Use Topics  . Alcohol use: No    Alcohol/week: 0.0 oz  . Drug use: No    ROS: As per history of present illness, otherwise negative  BP (!) 136/50   Pulse 81   Temp 97.7 F (36.5 C) (Oral)   Resp 15   Ht 5' 6"  (1.676 m)   Wt 240 lb (108.9 kg)   SpO2 100%   BMI 38.74 kg/m  Gen: awake, alert, NAD HEENT: anicteric, op clear CV: RRR, 2/6 sem Pulm: CTA b/l Abd: soft, NT/ND, +BS throughout Ext: no c/c/e Neuro: nonfocal, no asterixis   RELEVANT LABS AND IMAGING: CBC    Component Value Date/Time   WBC 4.8 12/22/2017 1133   RBC 3.62 (L) 12/22/2017 1133   HGB 12.3 (L) 12/22/2017 1133   HCT 35.7 (L) 12/22/2017 1133   PLT 43 (L) 12/22/2017 1133   MCV 98.6 12/22/2017 1133   MCH 34.0 (H) 12/22/2017 1133   MCHC 34.5 12/22/2017 1133   RDW 13.9 12/22/2017 1133   LYMPHSABS 874 12/22/2017 1133   MONOABS 0.7 11/02/2017 0420   EOSABS 187 12/22/2017 1133   BASOSABS 38 12/22/2017 1133    CMP     Component Value Date/Time   NA 137 12/22/2017 1133   K 3.8 12/22/2017 1133   CL 106 12/22/2017 1133   CO2 25 12/22/2017 1133   GLUCOSE 150 (H) 12/22/2017 1133   BUN 10 12/22/2017 1133   CREATININE 0.77 12/22/2017 1133    CALCIUM 8.4 (L) 12/22/2017 1133   PROT 6.6 12/22/2017 1133   ALBUMIN 2.2 (L) 11/14/2017 1616   AST 64 (H) 12/22/2017 1133   ALT 35 12/22/2017 1133   ALKPHOS 158 (H) 11/14/2017 1616   BILITOT 2.4 (H) 12/22/2017 1133   GFRNONAA 98 12/22/2017 1133   GFRAA 114 12/22/2017 1133   Lab Results  Component Value Date   INR 1.5 (H) 12/22/2017   INR 1.71 11/02/2017   INR 1.5 (H) 07/28/2017     ASSESSMENT/PLAN: 62 year old male with a history of NASH cirrhosis with portal hypertension with history of ascites, esophageal varices, hepatic encephalopathy, history of SBP, history of colon polyps who presents for outpatient  upper endoscopy and colonoscopy.  1. NASH cirrhosis with portal HTN and esophageal varices --history of esophageal varices and was previously on nonselective beta-blocker however with history of SBP this has been stopped.  For this reason we are repeating variceal screening/surveillance today.  He may require variceal band ligation for prophylaxis of hemorrhage.  He has a stable thrombocytopenia with last platelet count of 43,000 and INR 1.5  2.  History of colon polyp not removed in September 2018 and strep bovis bacteremia --repeat colonoscopy being performed today for polypectomy.  HIGHER THAN BASELINE RISK.The nature of the procedure, as well as the risks, benefits, and alternatives were carefully and thoroughly reviewed with the patient. Ample time for discussion and questions allowed. The patient understood, was satisfied, and agreed to proceed.

## 2018-01-02 NOTE — Discharge Instructions (Signed)
YOU HAD AN ENDOSCOPIC PROCEDURE TODAY: Refer to the procedure report and other information in the discharge instructions given to you for any specific questions about what was found during the examination. If this information does not answer your questions, please call [place] office at [phone] to clarify.  ° °YOU SHOULD EXPECT: Some feelings of bloating in the abdomen. Passage of more gas than usual. Walking can help get rid of the air that was put into your GI tract during the procedure and reduce the bloating. If you had a lower endoscopy (such as a colonoscopy or flexible sigmoidoscopy) you may notice spotting of blood in your stool or on the toilet paper. Some abdominal soreness may be present for a day or two, also. ° °DIET: Your first meal following the procedure should be a light meal and then it is ok to progress to your normal diet. A half-sandwich or bowl of soup is an example of a good first meal. Heavy or fried foods are harder to digest and may make you feel nauseous or bloated. Drink plenty of fluids but you should avoid alcoholic beverages for 24 hours. If you had a esophageal dilation, please see attached instructions for diet.   ° °ACTIVITY: Your care partner should take you home directly after the procedure. You should plan to take it easy, moving slowly for the rest of the day. You can resume normal activity the day after the procedure however YOU SHOULD NOT DRIVE, use power tools, machinery or perform tasks that involve climbing or major physical exertion for 24 hours (because of the sedation medicines used during the test).  ° °SYMPTOMS TO REPORT IMMEDIATELY: °A gastroenterologist can be reached at any hour. Please call [phone]  for any of the following symptoms:  °Following lower endoscopy (colonoscopy, flexible sigmoidoscopy) °Excessive amounts of blood in the stool  °Significant tenderness, worsening of abdominal pains  °Swelling of the abdomen that is new, acute  °Fever of 100° or  higher  °Following upper endoscopy (EGD, EUS, ERCP, esophageal dilation) °Vomiting of blood or coffee ground material  °New, significant abdominal pain  °New, significant chest pain or pain under the shoulder blades  °Painful or persistently difficult swallowing  °New shortness of breath  °Black, tarry-looking or red, bloody stools ° °FOLLOW UP:  °If any biopsies were taken you will be contacted by phone or by letter within the next 1-3 weeks. Call [phone]  if you have not heard about the biopsies in 3 weeks.  °Please also call with any specific questions about appointments or follow up tests. ° °

## 2018-01-02 NOTE — Transfer of Care (Signed)
Immediate Anesthesia Transfer of Care Note  Patient: Adam Butler  Procedure(s) Performed: Procedure(s): ESOPHAGOGASTRODUODENOSCOPY (EGD) WITH PROPOFOL (N/A) COLONOSCOPY WITH PROPOFOL (N/A) POLYPECTOMY BIOPSY  Patient Location: PACU  Anesthesia Type:MAC  Level of Consciousness:  sedated, patient cooperative and responds to stimulation  Airway & Oxygen Therapy:Patient Spontanous Breathing and Patient connected to face mask oxgen  Post-op Assessment:  Report given to PACU RN and Post -op Vital signs reviewed and stable  Post vital signs:  Reviewed and stable  Last Vitals:  Vitals:   01/02/18 0859  BP: (!) 136/50  Pulse: 81  Resp: 15  Temp: 36.5 C  SpO2: 412%    Complications: No apparent anesthesia complications

## 2018-01-02 NOTE — Op Note (Addendum)
[facility] Patient Name: Adam Butler Procedure Date: 01/02/2018 MRN: [id number] Attending MD: Jerene Bears , MD Date of Birth: [birth year] CSN: [id number] Age: 62 Admit Type: Outpatient Procedure:                Upper GI endoscopy Indications:              Follow-up of esophageal varices, history of NASH                            cirrhosis, previous of NSBB (now off due to hx of                            SBP) Providers:                Lajuan Lines. Hilarie Fredrickson, MD, Angus Seller, Charolette Child,                            Technician, Arnoldo Hooker, CRNA Referring MD:              Medicines:                Monitored Anesthesia Care Complications:            No immediate complications. Estimated Blood Loss:     Estimated blood loss: none. Procedure:                Pre-Anesthesia Assessment:                           - Prior to the procedure, a History and Physical                            was performed, and patient medications and                            allergies were reviewed. The patient's tolerance of                            previous anesthesia was also reviewed. The risks                            and benefits of the procedure and the sedation                            options and risks were discussed with the patient.                            All questions were answered, and informed consent                            was obtained. Prior Anticoagulants: The patient has                            taken no previous anticoagulant or antiplatelet  agents. ASA Grade Assessment: III - A patient with                            severe systemic disease. After reviewing the risks                            and benefits, the patient was deemed in                            satisfactory condition to undergo the procedure.                           After obtaining informed consent, the endoscope was                            passed under direct  vision. Throughout the                            procedure, the patient's blood pressure, pulse, and                            oxygen saturations were monitored continuously. The                            [id number]-[id number] ([id number]) scope was introduced through the                            mouth, and advanced to the second part of duodenum.                            The upper GI endoscopy was accomplished without                            difficulty. The patient tolerated the procedure                            well. Scope In: Scope Out: Findings:      Diffuse, white plaques were found in the upper third of the esophagus       and in the middle third of the esophagus.      Grade I, varices were found in the lower third of the esophagus. They       were small in size without stigmata. Based on size EVL was not performed       today.      Moderate portal hypertensive gastropathy was found in the cardia, in the       gastric fundus and in the gastric body.      There is no endoscopic evidence of varices in the cardia and in the       gastric fundus.      Scattered mild inflammation characterized by erosions was found in the       duodenal bulb.      The second portion of the duodenum was normal. Impression:               - Esophageal candidiasis.                           -  Small esophageal varices.                           - Portal hypertensive gastropathy.                           - Duodenitis.                           - Normal second portion of the duodenum.                           - No specimens collected. Moderate Sedation:      N/A Recommendation:           - Patient has a contact number available for                            emergencies. The signs and symptoms of potential                            delayed complications were discussed with the                            patient. Return to normal activities tomorrow.                            Written discharge  instructions were provided to the                            patient.                           - Low sodium diet.                           - Continue present medications.                           - Fluconazole 200 mg daily x 1 day, 100 mg x 13                            days.                           - Repeat upper endoscopy in 2 years for                            surveillance. Procedure Code(s):        --- Professional ---                           [id number], Esophagogastroduodenoscopy, flexible,                            transoral; diagnostic, including collection of  specimen(s) by brushing or washing, when performed                            (separate procedure) Diagnosis Code(s):        --- Professional ---                           I85.00, Esophageal varices without bleeding                           K76.6, Portal hypertension                           K31.89, Other diseases of stomach and duodenum                           K29.80, Duodenitis without bleeding CPT copyright 2017 American Medical Association. All rights reserved. The codes documented in this report are preliminary and upon coder review may  be revised to meet current compliance requirements. Jerene Bears, MD 01/02/2018 11:56:08 AM This report has been signed electronically. Number of Addenda: 0

## 2018-01-03 ENCOUNTER — Encounter (HOSPITAL_COMMUNITY): Payer: Self-pay | Admitting: Internal Medicine

## 2018-01-03 ENCOUNTER — Encounter: Payer: Self-pay | Admitting: Internal Medicine

## 2018-01-16 ENCOUNTER — Encounter: Payer: PPO | Admitting: Internal Medicine

## 2018-01-19 ENCOUNTER — Ambulatory Visit (INDEPENDENT_AMBULATORY_CARE_PROVIDER_SITE_OTHER): Payer: PPO | Admitting: Family Medicine

## 2018-01-19 ENCOUNTER — Emergency Department (HOSPITAL_BASED_OUTPATIENT_CLINIC_OR_DEPARTMENT_OTHER): Payer: PPO

## 2018-01-19 ENCOUNTER — Encounter: Payer: Self-pay | Admitting: Family Medicine

## 2018-01-19 ENCOUNTER — Emergency Department (HOSPITAL_BASED_OUTPATIENT_CLINIC_OR_DEPARTMENT_OTHER)
Admission: EM | Admit: 2018-01-19 | Discharge: 2018-01-19 | Disposition: A | Discharge status: Home / Self Care | Payer: PPO | Attending: Emergency Medicine | Admitting: Emergency Medicine

## 2018-01-19 ENCOUNTER — Encounter (HOSPITAL_BASED_OUTPATIENT_CLINIC_OR_DEPARTMENT_OTHER): Payer: Self-pay | Admitting: *Deleted

## 2018-01-19 ENCOUNTER — Other Ambulatory Visit: Payer: Self-pay

## 2018-01-19 VITALS — BP 126/64 | HR 85 | Temp 97.9°F | Resp 17 | Ht 66.0 in | Wt 269.4 lb

## 2018-01-19 DIAGNOSIS — I5032 Chronic diastolic (congestive) heart failure: Secondary | ICD-10-CM | POA: Diagnosis not present

## 2018-01-19 DIAGNOSIS — E119 Type 2 diabetes mellitus without complications: Secondary | ICD-10-CM | POA: Diagnosis not present

## 2018-01-19 DIAGNOSIS — R7989 Other specified abnormal findings of blood chemistry: Secondary | ICD-10-CM

## 2018-01-19 DIAGNOSIS — E876 Hypokalemia: Secondary | ICD-10-CM | POA: Insufficient documentation

## 2018-01-19 DIAGNOSIS — I2510 Atherosclerotic heart disease of native coronary artery without angina pectoris: Secondary | ICD-10-CM | POA: Diagnosis not present

## 2018-01-19 DIAGNOSIS — Z794 Long term (current) use of insulin: Secondary | ICD-10-CM | POA: Diagnosis not present

## 2018-01-19 DIAGNOSIS — Z79899 Other long term (current) drug therapy: Secondary | ICD-10-CM | POA: Diagnosis not present

## 2018-01-19 DIAGNOSIS — Z7982 Long term (current) use of aspirin: Secondary | ICD-10-CM | POA: Insufficient documentation

## 2018-01-19 DIAGNOSIS — R069 Unspecified abnormalities of breathing: Secondary | ICD-10-CM | POA: Insufficient documentation

## 2018-01-19 DIAGNOSIS — E7220 Disorder of urea cycle metabolism, unspecified: Secondary | ICD-10-CM | POA: Diagnosis not present

## 2018-01-19 DIAGNOSIS — R0600 Dyspnea, unspecified: Secondary | ICD-10-CM | POA: Diagnosis not present

## 2018-01-19 DIAGNOSIS — I110 Hypertensive heart disease with heart failure: Secondary | ICD-10-CM | POA: Insufficient documentation

## 2018-01-19 DIAGNOSIS — F17210 Nicotine dependence, cigarettes, uncomplicated: Secondary | ICD-10-CM | POA: Diagnosis not present

## 2018-01-19 DIAGNOSIS — R600 Localized edema: Secondary | ICD-10-CM

## 2018-01-19 DIAGNOSIS — R0602 Shortness of breath: Secondary | ICD-10-CM | POA: Diagnosis not present

## 2018-01-19 DIAGNOSIS — I252 Old myocardial infarction: Secondary | ICD-10-CM | POA: Diagnosis not present

## 2018-01-19 DIAGNOSIS — R0609 Other forms of dyspnea: Secondary | ICD-10-CM

## 2018-01-19 LAB — COMPREHENSIVE METABOLIC PANEL
ALK PHOS: 228 U/L — AB (ref 38–126)
ALT: 40 U/L (ref 0–44)
ANION GAP: 4 — AB (ref 5–15)
AST: 68 U/L — ABNORMAL HIGH (ref 15–41)
Albumin: 2.1 g/dL — ABNORMAL LOW (ref 3.5–5.0)
BUN: 6 mg/dL — ABNORMAL LOW (ref 8–23)
CALCIUM: 7.4 mg/dL — AB (ref 8.9–10.3)
CO2: 23 mmol/L (ref 22–32)
CREATININE: 0.63 mg/dL (ref 0.61–1.24)
Chloride: 106 mmol/L (ref 98–111)
Glucose, Bld: 303 mg/dL — ABNORMAL HIGH (ref 70–99)
Potassium: 3 mmol/L — ABNORMAL LOW (ref 3.5–5.1)
SODIUM: 133 mmol/L — AB (ref 135–145)
TOTAL PROTEIN: 6.2 g/dL — AB (ref 6.5–8.1)
Total Bilirubin: 2.2 mg/dL — ABNORMAL HIGH (ref 0.3–1.2)

## 2018-01-19 LAB — CBC
HCT: 31.1 % — ABNORMAL LOW (ref 39.0–52.0)
HEMOGLOBIN: 11.2 g/dL — AB (ref 13.0–17.0)
MCH: 34.7 pg — AB (ref 26.0–34.0)
MCHC: 36 g/dL (ref 30.0–36.0)
MCV: 96.3 fL (ref 78.0–100.0)
PLATELETS: 40 10*3/uL — AB (ref 150–400)
RBC: 3.23 MIL/uL — AB (ref 4.22–5.81)
RDW: 14.9 % (ref 11.5–15.5)
WBC: 4.4 10*3/uL (ref 4.0–10.5)

## 2018-01-19 LAB — AMMONIA: AMMONIA: 111 umol/L — AB (ref 9–35)

## 2018-01-19 LAB — BRAIN NATRIURETIC PEPTIDE: B NATRIURETIC PEPTIDE 5: 79.5 pg/mL (ref 0.0–100.0)

## 2018-01-19 LAB — TROPONIN I: TROPONIN I: 0.03 ng/mL — AB (ref <0.03)

## 2018-01-19 MED ORDER — POTASSIUM CHLORIDE CRYS ER 20 MEQ PO TBCR
40.0000 meq | EXTENDED_RELEASE_TABLET | Freq: Once | ORAL | Status: AC
  Administered 2018-01-19: 40 meq via ORAL
  Filled 2018-01-19: qty 2

## 2018-01-19 MED ORDER — FUROSEMIDE 10 MG/ML IJ SOLN
40.0000 mg | Freq: Once | INTRAMUSCULAR | Status: AC
Start: 2018-01-19 — End: 2018-01-19
  Administered 2018-01-19: 40 mg via INTRAVENOUS
  Filled 2018-01-19: qty 4

## 2018-01-19 NOTE — ED Provider Notes (Signed)
[facility] EMERGENCY DEPARTMENT Provider Note   CSN: [id number] Arrival date & time: 01/19/18  1633     History   Chief Complaint Chief Complaint  Patient presents with  . Leg Swelling    HPI Adam Butler is a 62 y.o. male.  Adam Butler is a 62 y.o. Male with a history of CHF, CAD, cirrhosis, and diabetes, presents to the emergency department from his PCPs office for evaluation of bilateral lower extremity edema worsening over the past week as well as dyspnea on exertion.  Patient reports he has been out of his Lasix for the past month he was unable to get them filled at the pharmacy and has had worsening swelling.  Patient reports especially over the past week both of his legs have become increasingly swollen up to the level of the knee, they are tight and somewhat painful.  He reports he has been feeling more short of breath when he gets up to do things.  Patient denies any chest pain, and has no shortness of breath at rest.  He denies any abdominal pain, nausea, vomiting or diaphoresis.  Patient reports aside from his Lasix he has been taking the rest of his medications regularly.  He denies feeling more confused or disoriented than usual and wife agrees.  Patient's primary care provider has prescribed him Lasix but because he has not had lab work done recently requested that he have basic labs and kidney function checked prior to restarting this medication.     Past Medical History:  Diagnosis Date  . Arthritis   . Cholelithiasis   . Cirrhosis ([place]) 2011   Cryptogenic, Likely NASH. Family/pt deny EtOH. HCV, HBV, HAV negative. ANA negative. AMA positive. Ascites 12/11  . Coronary artery disease    Inferior MI 12/11; LHC with occluded mid CFX and 80% proximal RCA. EF 55%. He had 3.0 x 28 vision BMS to CFX  . Diastolic CHF, acute ([place])    Echo 12/11 with ef 50-55% and mild LVH. EF 55% by LV0gram in 12/11  . Esophageal varices ([place]) 2011, 2013   no hx acute  variceal bleed  . Hepatic encephalopathy ([place]) 2011, 12/2013  . Hyperlipemia   . Myocardial infarction [facility]) ? 2012  . PFO (patent foramen ovale): Per TEE 04/20/2015 04/20/2015  . Portal hypertension ([place])   . Rectal varices   . S/P coronary artery stent placement 06/2010  . SVT (supraventricular tachycardia) ([place])    1/12: appeared to be an ectopic atrial tachycardia. Required DCCV with hemodynamic instability  . Type II diabetes mellitus ([place]) 2011    Patient Active Problem List   Diagnosis Date Noted  . History of colonic polyps   . Benign neoplasm of ascending colon   . Noninfectious gastroenteritis   . Chronic diastolic CHF (congestive heart failure) ([place])   . Thoracic ascending aortic aneurysm ([place])   . OA (osteoarthritis) of knee 07/19/2016  . Hepatic encephalopathy ([place]) 12/16/2015  . GERD (gastroesophageal reflux disease) 09/29/2015  . Type 2 diabetes mellitus ([place]) 08/18/2015  . CAD in native artery   . Liver cirrhosis secondary to NASH ([place]) 06/06/2015  . Tobacco abuse 05/04/2015  . PFO (patent foramen ovale): Per TEE 04/20/2015 04/20/2015  . Esophageal varices without bleeding ([place]) 08/16/2010    Past Surgical History:  Procedure Laterality Date  . APPENDECTOMY    . BIOPSY  01/02/2018   Procedure: BIOPSY;  Surgeon: Jerene Bears, MD;  Location: [facility];  Service: Gastroenterology;;  .  CARDIAC CATHETERIZATION  07/01/2010   BMS to CFX.  Marland Kitchen CATARACT EXTRACTION W/PHACO Left 04/28/2016   Procedure: CATARACT EXTRACTION PHACO AND INTRAOCULAR LENS PLACEMENT (IOC);  Surgeon: Leandrew Koyanagi, MD;  Location: [facility] ORS;  Service: Ophthalmology;  Laterality: Left;  Lot# [id number] H [place]: 05:31.7 AP%: 28.2 CDE: 93.48   . COLONOSCOPY  04/13/2012   Procedure: COLONOSCOPY;  Surgeon: Inda Castle, MD;  Location: [facility] ENDOSCOPY;  Service: Endoscopy;  Laterality: N/A;  . COLONOSCOPY WITH PROPOFOL N/A 03/22/2017   Procedure: COLONOSCOPY WITH PROPOFOL;  Surgeon: Doran Stabler, MD;  Location: [facility] ENDOSCOPY;  Service: Gastroenterology;  Laterality: N/A;  . COLONOSCOPY WITH PROPOFOL N/A 01/02/2018   Procedure: COLONOSCOPY WITH PROPOFOL;  Surgeon: Jerene Bears, MD;  Location: [facility] ENDOSCOPY;  Service: Gastroenterology;  Laterality: N/A;  . CORONARY ANGIOPLASTY    . CORONARY STENT PLACEMENT  06/30/2010   CFX   Distal        . ESOPHAGEAL BANDING N/A 01/01/2014   Procedure: ESOPHAGEAL BANDING;  Surgeon: Jerene Bears, MD;  Location: [facility] ENDOSCOPY;  Service: Endoscopy;  Laterality: N/A;  . ESOPHAGOGASTRODUODENOSCOPY  04/13/2012   Procedure: ESOPHAGOGASTRODUODENOSCOPY (EGD);  Surgeon: Inda Castle, MD;  Location: [facility];  Service: Endoscopy;  Laterality: N/A;  . ESOPHAGOGASTRODUODENOSCOPY N/A 01/01/2014   Procedure: ESOPHAGOGASTRODUODENOSCOPY (EGD);  Surgeon: Jerene Bears, MD;  Location[facility] ENDOSCOPY;  Service: Endoscopy;  Laterality: N/A;  . ESOPHAGOGASTRODUODENOSCOPY (EGD) WITH PROPOFOL N/A 07/12/2016   Procedure: ESOPHAGOGASTRODUODENOSCOPY (EGD) WITH PROPOFOL;  Surgeon: Jerene Bears, MD;  Location: [facility];  Service: Gastroenterology;  Laterality: N/A;  . ESOPHAGOGASTRODUODENOSCOPY (EGD) WITH PROPOFOL N/A 01/02/2018   Procedure: ESOPHAGOGASTRODUODENOSCOPY (EGD) WITH PROPOFOL;  Surgeon: Jerene Bears, MD;  Location: [facility] ENDOS[facility];  Service: Gastroenterology;  Laterality: N/A;  . LEFT HEART CATHETERIZATION WITH CORONARY ANGIOGRAM N/A 10/15/2014   Procedure: LEFT HEART CATHETERIZATION WITH CORONARY ANGIOGRAM;  Surgeon: Peter M Martinique, MD;  Location: [facility] CATH LAB;  Service: Cardiovascular;  Laterality: N/A;  . orif r leg    . POLYPECTOMY  01/02/2018   Procedure: POLYPECTOMY;  Surgeon: Jerene Bears, MD;  Location: [facility] ENDOSCOPY;  Service: Gastroenterology;;  . TEE WITHOUT CARDIOVERSION N/A 04/20/2015   Procedure: TRANSESOPHAGEAL ECHOCARDIOGRAM (TEE);  Surgeon: Fay Records, MD;  Location: [facility];  Service: Cardiovascular;  Laterality: N/A;  . TEE WITHOUT CARDIOVERSION  N/A 05/17/2017   Procedure: TRANSESOPHAGEAL ECHOCARDIOGRAM (TEE);  Surgeon: Josue Hector, MD;  Location: [facility] ENDOSCOPY;  Service: Cardiovascular;  Laterality: N/A;        Home Medications    Prior to Admission medications   Medication Sig Start Date End Date Taking? Authorizing Provider  aspirin EC 81 MG tablet Take 81 mg by mouth daily.    [provider]  atorvastatin (LIPITOR) 10 MG tablet TAKE 1 TABLET BY MOUTH ONCE DAILY 11/21/17   [name], NP  cyanocobalamin 1000 MCG tablet Take 1,000 mcg by mouth daily. Vitamin B12    [provider]  fluticasone (FLONASE) 50 MCG/ACT nasal spray Place 2 sprays into both nostrils daily as needed for allergies or rhinitis.     [provider]  furosemide (LASIX) 20 MG tablet Take 1 tablet (20 mg total) by mouth daily. Patient not taking: Reported on 01/19/2018 12/22/17   Delsa Grana, PA-C  insulin regular (NOVOLIN R,HUMULIN R) 100 units/mL injection Inject 2-8 Units into the skin 3 (three) times daily as needed for high blood sugar (CBG >150).     [provider]  lactulose (CHRONULAC) 10 GM/15ML solution Take 45 mLs (30 g total) by mouth 2 (two) times daily. 11/04/17   Shelly Coss, MD  levofloxacin (LEVAQUIN) 500 MG tablet Take 500 mg by mouth daily.  10/24/17   [provider]  Multiple Vitamin (MULTIVITAMIN WITH MINERALS) TABS tablet Take 1 tablet by mouth daily.    [provider]  nitroGLYCERIN (NITROSTAT) 0.4 MG SL tablet Place 1 tablet (0.4 mg total) under the tongue every 5 (five) minutes as needed for chest pain (chest pain). Patient not taking: Reported on 01/19/2018 07/19/16   [name], NP  Potassium 99 MG TABS Take 99 mg by mouth daily.    [provider]  ranitidine (ZANTAC) 150 MG tablet Take 150 mg by mouth 2 (two) times daily as needed for heartburn.     [provider]  spironolactone (ALDACTONE) 25 MG tablet Take 1 tablet (25 mg total) by mouth  daily. Patient not taking: Reported on 01/19/2018 07/28/17   Pyrtle, Lajuan Lines, MD    Family History Family History  Problem Relation Age of Onset  . Heart attack Brother 74       MI  . Diabetes Brother   . Heart attack Father   . Diabetes Father   . COPD Father   . COPD Mother   . Heart attack Brother   . Stroke Neg Hx     Social History Social History   Tobacco Use  . Smoking status: Current Every Day Smoker    Packs/day: 0.25    Years: 36.00    Pack years: 9.00    Types: E-cigarettes, Cigarettes  . Smokeless tobacco: Never Used  . Tobacco comment: uses vapor cigarettes (2016 ); I smoke about 4 cigarettes a day  Substance Use Topics  . Alcohol use: No    Alcohol/week: 0.0 oz  . Drug use: No     Allergies   Isosorbide nitrate   Review of Systems Review of Systems  Constitutional: Negative for chills and fever.  HENT: Negative.   Eyes: Negative for visual disturbance.  Respiratory: Positive for shortness of breath. Negative for cough, chest tightness and wheezing.   Cardiovascular: Positive for leg swelling. Negative for chest pain and palpitations.  Gastrointestinal: Negative for abdominal pain, nausea and vomiting.  Genitourinary: Negative for dysuria and frequency.  Musculoskeletal: Positive for myalgias. Negative for arthralgias.  Skin: Negative for color change and rash.  Neurological: Negative for dizziness, syncope and light-headedness.     Physical Exam Updated Vital Signs BP 133/62   Pulse 86   Temp 98.1 F (36.7 C) (Oral)   Resp 20   Ht 5' 6"  (1.676 m)   Wt 122 kg (269 lb)   SpO2 97%   BMI 43.42 kg/m   Physical Exam  Constitutional: He is oriented to person, place, and time. He appears well-developed and well-nourished. No distress.  HENT:  Head: Normocephalic and atraumatic.  Mouth/Throat: Oropharynx is clear and moist.  Eyes: Pupils are equal, round, and reactive to light. EOM are normal. Right eye exhibits no discharge. Left eye exhibits  no discharge.  Neck: Neck supple.  Cardiovascular: Normal rate, regular rhythm, normal heart sounds and intact distal pulses.  Pulmonary/Chest: Effort normal and breath sounds normal. No stridor. No respiratory distress. He has no wheezes. He has no rales.  Respirations equal and unlabored, patient able to speak in full sentences, lungs clear to auscultation bilaterally  Abdominal: Soft. Bowel sounds are normal. He exhibits no distension and no mass.  There is no tenderness. There is no guarding.  Abdomen soft, nondistended, nontender to palpation in all quadrants without guarding or peritoneal signs  Musculoskeletal:  Bilateral lower extremities with 2+ pitting edema up to the level of the knee, no overlying erythema or induration, no significant tenderness or palpable cord on exam.  Neurological: He is alert and oriented to person, place, and time. Coordination normal.  Speech is clear and patient is able to follow commands Moving all extremities without difficulty  Skin: Skin is warm and dry. Capillary refill takes less than 2 seconds. He is not diaphoretic.  Psychiatric: He has a normal mood and affect. His behavior is normal.  Nursing note and vitals reviewed.    ED Treatments / Results  Labs (all labs ordered are listed, but only abnormal results are displayed) Labs Reviewed  COMPREHENSIVE METABOLIC PANEL - Abnormal; Notable for the following components:      Result Value   Sodium 133 (*)    Potassium 3.0 (*)    Glucose, Bld 303 (*)    BUN 6 (*)    Calcium 7.4 (*)    Total Protein 6.2 (*)    Albumin 2.1 (*)    AST 68 (*)    Alkaline Phosphatase 228 (*)    Total Bilirubin 2.2 (*)    Anion gap 4 (*)    All other components within normal limits  CBC - Abnormal; Notable for the following components:   RBC 3.23 (*)    Hemoglobin 11.2 (*)    HCT 31.1 (*)    MCH 34.7 (*)    Platelets 40 (*)    All other components within normal limits  AMMONIA - Abnormal; Notable for the  following components:   Ammonia 111 (*)    All other components within normal limits  TROPONIN I - Abnormal; Notable for the following components:   Troponin I 0.03 (*)    All other components within normal limits  BRAIN NATRIURETIC PEPTIDE    EKG EKG Interpretation  Date/Time:  Friday January 19 2018 17:00:06 EDT Ventricular Rate:  80 PR Interval:    QRS Duration: 102 QT Interval:  419 QTC Calculation: 484 R Axis:   13 Text Interpretation:  Sinus or ectopic atrial rhythm Short PR interval Inferolateral infarct, old Baseline wander in lead(s) I II aVR aVL Confirmed by Julianne Rice [phone]) on 01/19/2018 6:45:41 PM   Radiology Dg Chest 2 View  Result Date: 01/19/2018 CLINICAL DATA:  Shortness of breath and bilateral leg swelling. EXAM: CHEST - 2 VIEW COMPARISON:  November 02, 2017 FINDINGS: The heart, hila, and mediastinum are normal. No pulmonary nodules or masses. No focal infiltrates. No overt edema. IMPRESSION: No active cardiopulmonary disease. Electronically Signed   By: Dorise Bullion III M.D   On: 01/19/2018 17:19    Procedures Procedures (including critical care time)  Medications Ordered in ED Medications  furosemide (LASIX) injection 40 mg (40 mg Intravenous Given 01/19/18 1759)  potassium chloride SA (K-DUR,KLOR-CON) CR tablet 40 mEq (40 mEq Oral Given 01/19/18 1758)     Initial Impression / Assessment and Plan / ED Course  I have reviewed the triage vital signs and the nursing notes.  Pertinent labs & imaging results that were available during my care of the patient were reviewed by me and considered in my medical decision making (see chart for details).  Patient presents for evaluation of lateral leg swelling and dyspnea on exertion.  Has been without Lasix for the past month, symptoms worsening  over the past week.  Denies chest pain.  No abdominal pain, nausea or vomiting.  Patient does not appear confused or obtunded.  On arrival vitals normal and patient in no  acute distress.  Lungs are clear to auscultation.  Abdomen benign.  Eating edema bilaterally up to the level of the knee.  Will check basic labs, troponin, BNP and ammonia level, as well as EKG and chest x-ray.  EKG without concerning ischemic changes.  Chest x-ray shows no evidence of pulmonary edema or other active cardiopulmonary disease.  No leukocytosis and stable hemoglobin.  Platelets of 40 but this appears to be baseline for the patient and is unchanged.  Mild hypokalemia of 3.0, replaced orally in the ED and patient takes daily potassium.  LFTs are elevated today, but this is unchanged from baseline.  Patient has normal renal function.  Glucose elevated, but no evidence of anion gap, patient has not taken his diabetes medications yet today and will take them when he arrives home.  Ammonia is 111 today, he typically ranges between 80 and 90 regularly, wife reports he has been encephalopathic at the time and does not feel he is exhibiting similar symptoms today.  On my evaluation patient is alert, will have him continue with his home lactulose and have this rechecked next week.  Patient given 40 mg IV Lasix given normal renal function.  Good urinary response here in the ED.  At this time patient is stable for discharge home.  He has new Lasix prescription waiting for him at [facility] and will pick it up prior to going home.  Strict return precautions discussed.  Patient expresses understanding and is in agreement with plan.  Case discussed with Dr. Lita Mains who is in agreement with plan.  Final Clinical Impressions(s) / ED Diagnoses   Final diagnoses:  Bilateral lower extremity edema  Dyspnea on exertion  Hypokalemia  Increased ammonia level    ED Discharge Orders    None       Janet Berlin 01/19/18 1933    Julianne Rice, MD 01/19/18 2304

## 2018-01-19 NOTE — ED Notes (Signed)
Patient transported to X-ray 

## 2018-01-19 NOTE — Patient Instructions (Addendum)
Go to the [facility] [address], [place], [place] [address]     Needs labs to safely diurese with complicated medical history.  VSS, but pt with severe increased WOB with short ambulation, has gained 30 lbs in less than one month.    Thank you  Delsa Grana

## 2018-01-19 NOTE — Progress Notes (Signed)
Patient ID: Adam Butler, male    DOB: Jul 28, 1955, 62 y.o.   MRN: [id number]  PCP: Delsa Grana, PA-C  Chief Complaint  Patient presents with  . Foot Swelling    Patient in today with bilateral foot swelling also has c/o sob. Onset 1 week ago. Has not been taking lasix    Subjective:   Adam Butler is a 62 y.o. male, presents to clinic with CC of leg swelling, SOB and stopped taking lasix.  Sx he stated began last week, his wife states she has Exertional sob 30 lbs weight gain Sleeping excessively - very fatigued Has not taken lasix in a month, he states the pharmacy would not give it to him He was seen here to establish care 12/22/17 and lasix was refilled, confirmed at pharmacy, but pt states he cannot get it.  Wife states he has not tried to get it or take it. He denies CP, syncope, jaundice.  Wife presents with him she states he is confused, but not typical of when ammonia is high 5 bm's today with lactulose.    Patient Active Problem List   Diagnosis Date Noted  . History of colonic polyps   . Benign neoplasm of ascending colon   . Noninfectious gastroenteritis   . Chronic diastolic CHF (congestive heart failure) ([place])   . Thoracic ascending aortic aneurysm ([place])   . OA (osteoarthritis) of knee 07/19/2016  . Hepatic encephalopathy ([place]) 12/16/2015  . GERD (gastroesophageal reflux disease) 09/29/2015  . Type 2 diabetes mellitus ([place]) 08/18/2015  . CAD in native artery   . Liver cirrhosis secondary to NASH ([place]) 06/06/2015  . Tobacco abuse 05/04/2015  . PFO (patent foramen ovale): Per TEE 04/20/2015 04/20/2015  . Esophageal varices without bleeding ([place]) 08/16/2010     Prior to Admission medications   Medication Sig Start Date End Date Taking? Authorizing Provider  aspirin EC 81 MG tablet Take 81 mg by mouth daily.   Yes [provider]  atorvastatin (LIPITOR) 10 MG tablet TAKE 1 TABLET BY MOUTH ONCE DAILY 11/21/17  Yes Baity, Coralie Keens, NP    cyanocobalamin 1000 MCG tablet Take 1,000 mcg by mouth daily. Vitamin B12   Yes [provider]  fluticasone (FLONASE) 50 MCG/ACT nasal spray Place 2 sprays into both nostrils daily as needed for allergies or rhinitis.    Yes [provider]  insulin regular (NOVOLIN R,HUMULIN R) 100 units/mL injection Inject 2-8 Units into the skin 3 (three) times daily as needed for high blood sugar (CBG >150).    Yes [provider]  lactulose (CHRONULAC) 10 GM/15ML solution Take 45 mLs (30 g total) by mouth 2 (two) times daily. 11/04/17  Yes Shelly Coss, MD  levofloxacin (LEVAQUIN) 500 MG tablet Take 500 mg by mouth daily.  10/24/17  Yes [provider]  Multiple Vitamin (MULTIVITAMIN WITH MINERALS) TABS tablet Take 1 tablet by mouth daily.   Yes [provider]  Potassium 99 MG TABS Take 99 mg by mouth daily.   Yes [provider]  ranitidine (ZANTAC) 150 MG tablet Take 150 mg by mouth 2 (two) times daily as needed for heartburn.    Yes [provider]  furosemide (LASIX) 20 MG tablet Take 1 tablet (20 mg total) by mouth daily. Patient not taking: Reported on 01/19/2018 12/22/17   Delsa Grana, PA-C  nitroGLYCERIN (NITROSTAT) 0.4 MG SL tablet Place 1 tablet (0.4 mg total) under the tongue every 5 (five) minutes as needed for  chest pain (chest pain). Patient not taking: Reported on 01/19/2018 07/19/16   [name], NP  spironolactone (ALDACTONE) 25 MG tablet Take 1 tablet (25 mg total) by mouth daily. Patient not taking: Reported on 01/19/2018 07/28/17   Pyrtle, Lajuan Lines, MD     Allergies  Allergen Reactions  . Isosorbide Nitrate Other (See Comments)    Nose bleeds     Family History  Problem Relation Age of Onset  . Heart attack Brother 41       MI  . Diabetes Brother   . Heart attack Father   . Diabetes Father   . COPD Father   . COPD Mother   . Heart attack Brother   . Stroke Neg Hx      Social History   Socioeconomic History   . Marital status: Married    Spouse name: Not on file  . Number of children: 3  . Years of education: Not on file  . Highest education level: Not on file  Occupational History  . Occupation: Merchandiser, retail: OTHER    Comment: Worked in Theatre manager prior  Social Needs  . Financial resource strain: Not on file  . Food insecurity:    Worry: Not on file    Inability: Not on file  . Transportation needs:    Medical: Not on file    Non-medical: Not on file  Tobacco Use  . Smoking status: Current Every Day Smoker    Packs/day: 0.25    Years: 36.00    Pack years: 9.00    Types: E-cigarettes, Cigarettes  . Smokeless tobacco: Never Used  . Tobacco comment: uses vapor cigarettes (2016 ); I smoke about 4 cigarettes a day  Substance and Sexual Activity  . Alcohol use: No    Alcohol/week: 0.0 oz  . Drug use: No  . Sexual activity: Never  Lifestyle  . Physical activity:    Days per week: Not on file    Minutes per session: Not on file  . Stress: Not on file  Relationships  . Social connections:    Talks on phone: Not on file    Gets together: Not on file    Attends religious service: Not on file    Active member of club or organization: Not on file    Attends meetings of clubs or organizations: Not on file    Relationship status: Not on file  . Intimate partner violence:    Fear of current or ex partner: Not on file    Emotionally abused: Not on file    Physically abused: Not on file    Forced sexual activity: Not on file  Other Topics Concern  . Not on file  Social History Narrative   Married   Gets regular exercise: walking     Review of Systems  Constitutional: Positive for fatigue. Negative for chills and diaphoresis.  HENT: Negative.   Eyes: Negative.   Respiratory: Positive for shortness of breath. Negative for cough.   Cardiovascular: Positive for leg swelling. Negative for chest pain and palpitations.  Gastrointestinal: Negative.   Endocrine: Negative.    Genitourinary: Negative.   Musculoskeletal: Negative.   Skin: Negative.  Negative for color change.  Allergic/Immunologic: Negative.   Neurological: Positive for weakness. Negative for dizziness, syncope and light-headedness.  Hematological: Negative.   Psychiatric/Behavioral: Positive for confusion.  All other systems reviewed and are negative.      Objective:    Vitals:   01/19/18 1429  BP: 126/64  Pulse: 85  Resp: 17  Temp: 97.9 F (36.6 C)  TempSrc: Oral  SpO2: 98%  Weight: 269 lb 6 oz (122.2 kg)  Height: 5' 6"  (1.676 m)      Physical Exam  Constitutional: He is oriented to person, place, and time. Vital signs are normal. He appears well-developed and well-nourished. He has a sickly appearance. He appears ill.  Chronically ill appearing male, alert & O x 3, NAD  HENT:  Head: Normocephalic and atraumatic.  Right Ear: Tympanic membrane, external ear and ear canal normal.  Left Ear: Tympanic membrane, external ear and ear canal normal.  Nose: Nose normal. No mucosal edema or rhinorrhea. Right sinus exhibits no maxillary sinus tenderness and no frontal sinus tenderness. Left sinus exhibits no maxillary sinus tenderness and no frontal sinus tenderness.  Mouth/Throat: Uvula is midline. No trismus in the jaw. No uvula swelling. No oropharyngeal exudate, posterior oropharyngeal edema or posterior oropharyngeal erythema.  Eyes: Pupils are equal, round, and reactive to light. Conjunctivae, EOM and lids are normal. No scleral icterus.  Neck: Trachea normal, normal range of motion and phonation normal. Neck supple. No JVD present. No tracheal deviation present.  Cardiovascular: Normal rate, regular rhythm and normal pulses. Exam reveals no gallop and no friction rub.  Murmur heard. Pulses:      Radial pulses are 2+ on the right side, and 2+ on the left side.  2+ b/l pretibial pitting edema  Pulmonary/Chest: Effort normal. No stridor. Tachypnea noted. No respiratory distress. He  has decreased breath sounds. He has no wheezes. He has no rhonchi. He has no rales.  Abdominal: Soft. Normal appearance and bowel sounds are normal. He exhibits no distension and no mass. There is no tenderness. There is no rebound and no guarding.  Musculoskeletal: Normal range of motion. He exhibits no edema.  Neurological: He is alert and oriented to person, place, and time. He displays no tremor. He exhibits normal muscle tone. He displays no seizure activity.  Walks with cane  Skin: Skin is warm, dry and intact. Capillary refill takes less than 2 seconds. No rash noted. He is not diaphoretic.  Slightly ashy coloring, no jaundice  Psychiatric: Thought content normal. His affect is inappropriate. His speech is delayed and slurred. He is slowed. Cognition and memory are normal. He expresses inappropriate judgment. He expresses no homicidal and no suicidal ideation. He expresses no suicidal plans and no homicidal plans.  Nursing note and vitals reviewed.       Assessment & Plan:      ICD-10-CM   1. Acute on chronic congestive heart failure, unspecified heart failure type ([place]) I50.9     Concern for fluid overload secondary to not taking lasix for the past month.  Also has NASH, Hx of CAD/MI, dHF, T2DM, Hx of SBP, hepatic encephalopathy.  Very complicated pt.  He does not even look like the same man that I saw as a new patient less than a month ago.   I have told pt that will extent of edema and sx, with his comorbidities, that I cannot safely do labs from clinic on a Friday afternoon, even stat labs.  He needs to go to ER for labs, CXR, to safely diurese.  Pt did not desaturate with ambulatory pulse ox, please see LPN note, he did have increased WOB with tachypnea, inability to speak in short sentences.  Referred to ER with his wife who agrees with plan.       Delsa Grana,  PA-C 01/19/18 3:21 PM  Report given to Dr. Lita Mains 3:47 PM

## 2018-01-19 NOTE — ED Notes (Signed)
Date and time results received: 01/19/18 1741   Test: trp Critical Value: 0.03 Name of Provider Notified: Merleen Nicely, [place]  Orders Received? Or Actions Taken?: no orders given

## 2018-01-19 NOTE — Discharge Instructions (Signed)
Your labs overall look good today and chest x-ray does not show fluid on your lungs.  Please make sure you are taking your Lasix daily as directed.  Your potassium was a little bit low today and you were given some replacement please make sure you are taking her home potassium pills daily as well.   Your ammonia level was a bit more elevated than usual, please make sure you are taking her lactulose and follow-up early next week rechecked.  Become increasingly confused please return to the emergency department.  Return to the emergency department for fevers, chest pain, worsening shortness of breath or any other new or concerning symptoms.

## 2018-01-19 NOTE — ED Notes (Signed)
ED Provider at bedside. 

## 2018-01-19 NOTE — ED Triage Notes (Signed)
Swelling in his legs on and off for a month. He has not been taking his Lasix because the pharmacy would not fill the Rx. He saw his MD today and was told to come here to be evaluated so he can get his Rx.

## 2018-01-22 ENCOUNTER — Other Ambulatory Visit: Payer: Self-pay | Admitting: Internal Medicine

## 2018-01-24 ENCOUNTER — Other Ambulatory Visit: Payer: Self-pay

## 2018-01-24 ENCOUNTER — Inpatient Hospital Stay (HOSPITAL_COMMUNITY)
Admission: EM | Admit: 2018-01-24 | Discharge: 2018-01-27 | DRG: 442 | Disposition: A | Discharge status: Home / Self Care | Payer: PPO | Attending: Internal Medicine | Admitting: Internal Medicine

## 2018-01-24 ENCOUNTER — Encounter (HOSPITAL_COMMUNITY): Payer: Self-pay | Admitting: Emergency Medicine

## 2018-01-24 DIAGNOSIS — K219 Gastro-esophageal reflux disease without esophagitis: Secondary | ICD-10-CM | POA: Diagnosis present

## 2018-01-24 DIAGNOSIS — Z833 Family history of diabetes mellitus: Secondary | ICD-10-CM

## 2018-01-24 DIAGNOSIS — R609 Edema, unspecified: Secondary | ICD-10-CM | POA: Diagnosis not present

## 2018-01-24 DIAGNOSIS — M549 Dorsalgia, unspecified: Secondary | ICD-10-CM | POA: Diagnosis not present

## 2018-01-24 DIAGNOSIS — K7469 Other cirrhosis of liver: Secondary | ICD-10-CM | POA: Diagnosis not present

## 2018-01-24 DIAGNOSIS — E876 Hypokalemia: Secondary | ICD-10-CM | POA: Diagnosis not present

## 2018-01-24 DIAGNOSIS — F17210 Nicotine dependence, cigarettes, uncomplicated: Secondary | ICD-10-CM | POA: Diagnosis present

## 2018-01-24 DIAGNOSIS — I10 Essential (primary) hypertension: Secondary | ICD-10-CM | POA: Diagnosis not present

## 2018-01-24 DIAGNOSIS — K7581 Nonalcoholic steatohepatitis (NASH): Secondary | ICD-10-CM | POA: Diagnosis present

## 2018-01-24 DIAGNOSIS — D696 Thrombocytopenia, unspecified: Secondary | ICD-10-CM | POA: Diagnosis present

## 2018-01-24 DIAGNOSIS — F17290 Nicotine dependence, other tobacco product, uncomplicated: Secondary | ICD-10-CM | POA: Diagnosis present

## 2018-01-24 DIAGNOSIS — I491 Atrial premature depolarization: Secondary | ICD-10-CM | POA: Diagnosis not present

## 2018-01-24 DIAGNOSIS — I959 Hypotension, unspecified: Secondary | ICD-10-CM | POA: Diagnosis not present

## 2018-01-24 DIAGNOSIS — D539 Nutritional anemia, unspecified: Secondary | ICD-10-CM | POA: Diagnosis present

## 2018-01-24 DIAGNOSIS — Z79890 Hormone replacement therapy (postmenopausal): Secondary | ICD-10-CM

## 2018-01-24 DIAGNOSIS — Z955 Presence of coronary angioplasty implant and graft: Secondary | ICD-10-CM

## 2018-01-24 DIAGNOSIS — R600 Localized edema: Secondary | ICD-10-CM | POA: Diagnosis not present

## 2018-01-24 DIAGNOSIS — Z8249 Family history of ischemic heart disease and other diseases of the circulatory system: Secondary | ICD-10-CM | POA: Diagnosis not present

## 2018-01-24 DIAGNOSIS — Z7982 Long term (current) use of aspirin: Secondary | ICD-10-CM | POA: Diagnosis not present

## 2018-01-24 DIAGNOSIS — Z79899 Other long term (current) drug therapy: Secondary | ICD-10-CM

## 2018-01-24 DIAGNOSIS — I2510 Atherosclerotic heart disease of native coronary artery without angina pectoris: Secondary | ICD-10-CM | POA: Diagnosis present

## 2018-01-24 DIAGNOSIS — Z792 Long term (current) use of antibiotics: Secondary | ICD-10-CM | POA: Diagnosis not present

## 2018-01-24 DIAGNOSIS — Z794 Long term (current) use of insulin: Secondary | ICD-10-CM

## 2018-01-24 DIAGNOSIS — Z888 Allergy status to other drugs, medicaments and biological substances status: Secondary | ICD-10-CM | POA: Diagnosis not present

## 2018-01-24 DIAGNOSIS — I110 Hypertensive heart disease with heart failure: Secondary | ICD-10-CM | POA: Diagnosis present

## 2018-01-24 DIAGNOSIS — K7290 Hepatic failure, unspecified without coma: Secondary | ICD-10-CM | POA: Diagnosis not present

## 2018-01-24 DIAGNOSIS — K7200 Acute and subacute hepatic failure without coma: Principal | ICD-10-CM | POA: Diagnosis present

## 2018-01-24 DIAGNOSIS — R278 Other lack of coordination: Secondary | ICD-10-CM | POA: Diagnosis present

## 2018-01-24 DIAGNOSIS — Z86010 Personal history of colonic polyps: Secondary | ICD-10-CM

## 2018-01-24 DIAGNOSIS — E1159 Type 2 diabetes mellitus with other circulatory complications: Secondary | ICD-10-CM | POA: Diagnosis not present

## 2018-01-24 DIAGNOSIS — Z825 Family history of asthma and other chronic lower respiratory diseases: Secondary | ICD-10-CM | POA: Diagnosis not present

## 2018-01-24 DIAGNOSIS — Z9842 Cataract extraction status, left eye: Secondary | ICD-10-CM

## 2018-01-24 DIAGNOSIS — E785 Hyperlipidemia, unspecified: Secondary | ICD-10-CM | POA: Diagnosis not present

## 2018-01-24 DIAGNOSIS — K7460 Unspecified cirrhosis of liver: Secondary | ICD-10-CM | POA: Diagnosis not present

## 2018-01-24 DIAGNOSIS — I5032 Chronic diastolic (congestive) heart failure: Secondary | ICD-10-CM | POA: Diagnosis present

## 2018-01-24 DIAGNOSIS — M1710 Unilateral primary osteoarthritis, unspecified knee: Secondary | ICD-10-CM | POA: Diagnosis present

## 2018-01-24 DIAGNOSIS — K7682 Hepatic encephalopathy: Secondary | ICD-10-CM | POA: Diagnosis present

## 2018-01-24 DIAGNOSIS — E1165 Type 2 diabetes mellitus with hyperglycemia: Secondary | ICD-10-CM | POA: Diagnosis present

## 2018-01-24 DIAGNOSIS — I712 Thoracic aortic aneurysm, without rupture: Secondary | ICD-10-CM | POA: Diagnosis present

## 2018-01-24 DIAGNOSIS — I252 Old myocardial infarction: Secondary | ICD-10-CM

## 2018-01-24 DIAGNOSIS — E119 Type 2 diabetes mellitus without complications: Secondary | ICD-10-CM

## 2018-01-24 LAB — URINALYSIS, ROUTINE W REFLEX MICROSCOPIC
BACTERIA UA: NONE SEEN
Bilirubin Urine: NEGATIVE
Ketones, ur: NEGATIVE mg/dL
Leukocytes, UA: NEGATIVE
Nitrite: NEGATIVE
Protein, ur: NEGATIVE mg/dL
SPECIFIC GRAVITY, URINE: 1.021 (ref 1.005–1.030)
pH: 7 (ref 5.0–8.0)

## 2018-01-24 LAB — I-STAT TROPONIN, ED: Troponin i, poc: 0 ng/mL (ref 0.00–0.08)

## 2018-01-24 LAB — COMPREHENSIVE METABOLIC PANEL
ALT: 33 U/L (ref 0–44)
AST: 49 U/L — ABNORMAL HIGH (ref 15–41)
Albumin: 2 g/dL — ABNORMAL LOW (ref 3.5–5.0)
Alkaline Phosphatase: 258 U/L — ABNORMAL HIGH (ref 38–126)
Anion gap: 6 (ref 5–15)
BUN: 6 mg/dL — ABNORMAL LOW (ref 8–23)
CHLORIDE: 101 mmol/L (ref 98–111)
CO2: 25 mmol/L (ref 22–32)
CREATININE: 0.79 mg/dL (ref 0.61–1.24)
Calcium: 7.9 mg/dL — ABNORMAL LOW (ref 8.9–10.3)
GFR calc Af Amer: >60 mL/min (ref >60)
GLUCOSE: 449 mg/dL — AB (ref 70–99)
Potassium: 3 mmol/L — ABNORMAL LOW (ref 3.5–5.1)
Sodium: 132 mmol/L — ABNORMAL LOW (ref 135–145)
Total Bilirubin: 2.4 mg/dL — ABNORMAL HIGH (ref 0.3–1.2)
Total Protein: 5.9 g/dL — ABNORMAL LOW (ref 6.5–8.1)

## 2018-01-24 LAB — CBC
HCT: 32.2 % — ABNORMAL LOW (ref 39.0–52.0)
Hemoglobin: 10.8 g/dL — ABNORMAL LOW (ref 13.0–17.0)
MCH: 33.6 pg (ref 26.0–34.0)
MCHC: 33.5 g/dL (ref 30.0–36.0)
MCV: 100.3 fL — AB (ref 78.0–100.0)
PLATELETS: 39 10*3/uL — AB (ref 150–400)
RBC: 3.21 MIL/uL — ABNORMAL LOW (ref 4.22–5.81)
RDW: 14.7 % (ref 11.5–15.5)
WBC: 4.4 10*3/uL (ref 4.0–10.5)

## 2018-01-24 LAB — AMMONIA: AMMONIA: 190 umol/L — AB (ref 9–35)

## 2018-01-24 LAB — LIPASE, BLOOD: LIPASE: 59 U/L — AB (ref 11–51)

## 2018-01-24 NOTE — ED Notes (Signed)
Pt and family asking why provider has not been in the room. Pt and family educated on wait for provider.

## 2018-01-24 NOTE — ED Triage Notes (Signed)
Per EMS, pt was put back on lasix last Friday per PCP for ascites and bilateral leg swelling. Pt states his swelling has only gotten worse since. Pt has been taking his lasix and states that he has been urinating more.

## 2018-01-25 ENCOUNTER — Encounter (HOSPITAL_COMMUNITY): Payer: Self-pay

## 2018-01-25 DIAGNOSIS — Z888 Allergy status to other drugs, medicaments and biological substances status: Secondary | ICD-10-CM | POA: Diagnosis not present

## 2018-01-25 DIAGNOSIS — K7581 Nonalcoholic steatohepatitis (NASH): Secondary | ICD-10-CM

## 2018-01-25 DIAGNOSIS — K7290 Hepatic failure, unspecified without coma: Secondary | ICD-10-CM

## 2018-01-25 DIAGNOSIS — D539 Nutritional anemia, unspecified: Secondary | ICD-10-CM

## 2018-01-25 DIAGNOSIS — Z79899 Other long term (current) drug therapy: Secondary | ICD-10-CM | POA: Diagnosis not present

## 2018-01-25 DIAGNOSIS — Z9842 Cataract extraction status, left eye: Secondary | ICD-10-CM | POA: Diagnosis not present

## 2018-01-25 DIAGNOSIS — Z825 Family history of asthma and other chronic lower respiratory diseases: Secondary | ICD-10-CM | POA: Diagnosis not present

## 2018-01-25 DIAGNOSIS — I5032 Chronic diastolic (congestive) heart failure: Secondary | ICD-10-CM | POA: Diagnosis present

## 2018-01-25 DIAGNOSIS — Z79890 Hormone replacement therapy (postmenopausal): Secondary | ICD-10-CM | POA: Diagnosis not present

## 2018-01-25 DIAGNOSIS — Z833 Family history of diabetes mellitus: Secondary | ICD-10-CM | POA: Diagnosis not present

## 2018-01-25 DIAGNOSIS — E876 Hypokalemia: Secondary | ICD-10-CM

## 2018-01-25 DIAGNOSIS — Z794 Long term (current) use of insulin: Secondary | ICD-10-CM | POA: Diagnosis not present

## 2018-01-25 DIAGNOSIS — Z955 Presence of coronary angioplasty implant and graft: Secondary | ICD-10-CM | POA: Diagnosis not present

## 2018-01-25 DIAGNOSIS — E785 Hyperlipidemia, unspecified: Secondary | ICD-10-CM | POA: Diagnosis present

## 2018-01-25 DIAGNOSIS — Z7982 Long term (current) use of aspirin: Secondary | ICD-10-CM | POA: Diagnosis not present

## 2018-01-25 DIAGNOSIS — Z792 Long term (current) use of antibiotics: Secondary | ICD-10-CM | POA: Diagnosis not present

## 2018-01-25 DIAGNOSIS — K7200 Acute and subacute hepatic failure without coma: Secondary | ICD-10-CM | POA: Diagnosis present

## 2018-01-25 DIAGNOSIS — E1159 Type 2 diabetes mellitus with other circulatory complications: Secondary | ICD-10-CM

## 2018-01-25 DIAGNOSIS — D696 Thrombocytopenia, unspecified: Secondary | ICD-10-CM | POA: Diagnosis present

## 2018-01-25 DIAGNOSIS — I110 Hypertensive heart disease with heart failure: Secondary | ICD-10-CM | POA: Diagnosis present

## 2018-01-25 DIAGNOSIS — E1165 Type 2 diabetes mellitus with hyperglycemia: Secondary | ICD-10-CM | POA: Diagnosis present

## 2018-01-25 DIAGNOSIS — Z8249 Family history of ischemic heart disease and other diseases of the circulatory system: Secondary | ICD-10-CM | POA: Diagnosis not present

## 2018-01-25 DIAGNOSIS — K7460 Unspecified cirrhosis of liver: Secondary | ICD-10-CM

## 2018-01-25 DIAGNOSIS — I252 Old myocardial infarction: Secondary | ICD-10-CM | POA: Diagnosis not present

## 2018-01-25 DIAGNOSIS — K7469 Other cirrhosis of liver: Secondary | ICD-10-CM | POA: Diagnosis present

## 2018-01-25 DIAGNOSIS — R609 Edema, unspecified: Secondary | ICD-10-CM

## 2018-01-25 DIAGNOSIS — I2510 Atherosclerotic heart disease of native coronary artery without angina pectoris: Secondary | ICD-10-CM | POA: Diagnosis present

## 2018-01-25 DIAGNOSIS — K219 Gastro-esophageal reflux disease without esophagitis: Secondary | ICD-10-CM | POA: Diagnosis present

## 2018-01-25 LAB — MAGNESIUM: Magnesium: 1.5 mg/dL — ABNORMAL LOW (ref 1.7–2.4)

## 2018-01-25 LAB — COMPREHENSIVE METABOLIC PANEL
ALT: 33 U/L (ref 0–44)
ANION GAP: 5 (ref 5–15)
AST: 60 U/L — AB (ref 15–41)
Albumin: 2 g/dL — ABNORMAL LOW (ref 3.5–5.0)
Alkaline Phosphatase: 217 U/L — ABNORMAL HIGH (ref 38–126)
BILIRUBIN TOTAL: 2.6 mg/dL — AB (ref 0.3–1.2)
BUN: 6 mg/dL — AB (ref 8–23)
CHLORIDE: 106 mmol/L (ref 98–111)
CO2: 27 mmol/L (ref 22–32)
Calcium: 8 mg/dL — ABNORMAL LOW (ref 8.9–10.3)
Creatinine, Ser: 0.67 mg/dL (ref 0.61–1.24)
Glucose, Bld: 248 mg/dL — ABNORMAL HIGH (ref 70–99)
POTASSIUM: 3.2 mmol/L — AB (ref 3.5–5.1)
Sodium: 138 mmol/L (ref 135–145)
TOTAL PROTEIN: 5.9 g/dL — AB (ref 6.5–8.1)

## 2018-01-25 LAB — GLUCOSE, CAPILLARY
GLUCOSE-CAPILLARY: 237 mg/dL — AB (ref 70–99)
GLUCOSE-CAPILLARY: 258 mg/dL — AB (ref 70–99)
GLUCOSE-CAPILLARY: 304 mg/dL — AB (ref 70–99)
Glucose-Capillary: 233 mg/dL — ABNORMAL HIGH (ref 70–99)
Glucose-Capillary: 253 mg/dL — ABNORMAL HIGH (ref 70–99)
Glucose-Capillary: 304 mg/dL — ABNORMAL HIGH (ref 70–99)

## 2018-01-25 LAB — CBC
HCT: 32.8 % — ABNORMAL LOW (ref 39.0–52.0)
Hemoglobin: 11 g/dL — ABNORMAL LOW (ref 13.0–17.0)
MCH: 33.3 pg (ref 26.0–34.0)
MCHC: 33.5 g/dL (ref 30.0–36.0)
MCV: 99.4 fL (ref 78.0–100.0)
PLATELETS: 37 10*3/uL — AB (ref 150–400)
RBC: 3.3 MIL/uL — AB (ref 4.22–5.81)
RDW: 15 % (ref 11.5–15.5)
WBC: 5.4 10*3/uL (ref 4.0–10.5)

## 2018-01-25 LAB — BASIC METABOLIC PANEL
ANION GAP: 4 — AB (ref 5–15)
BUN: 5 mg/dL — AB (ref 8–23)
CHLORIDE: 108 mmol/L (ref 98–111)
CO2: 27 mmol/L (ref 22–32)
Calcium: 8.4 mg/dL — ABNORMAL LOW (ref 8.9–10.3)
Creatinine, Ser: 0.76 mg/dL (ref 0.61–1.24)
GFR calc Af Amer: >60 mL/min (ref >60)
GLUCOSE: 276 mg/dL — AB (ref 70–99)
POTASSIUM: 3.4 mmol/L — AB (ref 3.5–5.1)
Sodium: 139 mmol/L (ref 135–145)

## 2018-01-25 LAB — PROTIME-INR
INR: 1.91
PROTHROMBIN TIME: 21.7 s — AB (ref 11.4–15.2)

## 2018-01-25 LAB — AMMONIA: AMMONIA: 120 umol/L — AB (ref 9–35)

## 2018-01-25 LAB — CBG MONITORING, ED: GLUCOSE-CAPILLARY: 271 mg/dL — AB (ref 70–99)

## 2018-01-25 MED ORDER — ADULT MULTIVITAMIN W/MINERALS CH
1.0000 | ORAL_TABLET | Freq: Every day | ORAL | Status: DC
  Administered 2018-01-25 – 2018-01-27 (×3): 1 via ORAL
  Filled 2018-01-25 (×3): qty 1

## 2018-01-25 MED ORDER — LEVOFLOXACIN 500 MG PO TABS
500.0000 mg | ORAL_TABLET | Freq: Every day | ORAL | Status: DC
  Administered 2018-01-25 – 2018-01-27 (×3): 500 mg via ORAL
  Filled 2018-01-25 (×3): qty 1

## 2018-01-25 MED ORDER — POTASSIUM CHLORIDE CRYS ER 20 MEQ PO TBCR
20.0000 meq | EXTENDED_RELEASE_TABLET | Freq: Every day | ORAL | Status: DC
  Administered 2018-01-25: 20 meq via ORAL
  Filled 2018-01-25: qty 1

## 2018-01-25 MED ORDER — POTASSIUM CHLORIDE CRYS ER 20 MEQ PO TBCR
40.0000 meq | EXTENDED_RELEASE_TABLET | Freq: Once | ORAL | Status: AC
  Administered 2018-01-25: 40 meq via ORAL
  Filled 2018-01-25: qty 2

## 2018-01-25 MED ORDER — ATORVASTATIN CALCIUM 10 MG PO TABS
10.0000 mg | ORAL_TABLET | Freq: Every day | ORAL | Status: DC
  Administered 2018-01-25: 10 mg via ORAL
  Filled 2018-01-25: qty 1

## 2018-01-25 MED ORDER — SPIRONOLACTONE 25 MG PO TABS
25.0000 mg | ORAL_TABLET | Freq: Every day | ORAL | Status: DC
  Administered 2018-01-25 – 2018-01-27 (×3): 25 mg via ORAL
  Filled 2018-01-25 (×3): qty 1

## 2018-01-25 MED ORDER — ONDANSETRON HCL 4 MG PO TABS: 4.0000 mg | ORAL_TABLET | Freq: Four times a day (QID) | ORAL | Status: DC | PRN

## 2018-01-25 MED ORDER — POTASSIUM CHLORIDE CRYS ER 20 MEQ PO TBCR
40.0000 meq | EXTENDED_RELEASE_TABLET | ORAL | Status: AC
  Administered 2018-01-25 (×2): 40 meq via ORAL
  Filled 2018-01-25 (×2): qty 2

## 2018-01-25 MED ORDER — INSULIN ASPART 100 UNIT/ML ~~LOC~~ SOLN
10.0000 [IU] | Freq: Once | SUBCUTANEOUS | Status: AC
  Administered 2018-01-25: 10 [IU] via SUBCUTANEOUS
  Filled 2018-01-25: qty 1

## 2018-01-25 MED ORDER — MAGNESIUM SULFATE 2 GM/50ML IV SOLN
2.0000 g | Freq: Once | INTRAVENOUS | Status: AC
  Administered 2018-01-25: 2 g via INTRAVENOUS
  Filled 2018-01-25: qty 50

## 2018-01-25 MED ORDER — LACTULOSE 10 GM/15ML PO SOLN
30.0000 g | Freq: Once | ORAL | Status: AC
  Administered 2018-01-25: 30 g via ORAL
  Filled 2018-01-25: qty 45

## 2018-01-25 MED ORDER — SODIUM CHLORIDE 0.9 % IV SOLN
1.0000 g | Freq: Once | INTRAVENOUS | Status: AC
  Administered 2018-01-25: 1 g via INTRAVENOUS
  Filled 2018-01-25: qty 10

## 2018-01-25 MED ORDER — VITAMIN B-12 1000 MCG PO TABS
1000.0000 ug | ORAL_TABLET | Freq: Every day | ORAL | Status: DC
  Administered 2018-01-25 – 2018-01-27 (×3): 1000 ug via ORAL
  Filled 2018-01-25 (×3): qty 1

## 2018-01-25 MED ORDER — POTASSIUM CHLORIDE 10 MEQ/100ML IV SOLN
10.0000 meq | Freq: Once | INTRAVENOUS | Status: AC
  Administered 2018-01-25: 10 meq via INTRAVENOUS
  Filled 2018-01-25: qty 100

## 2018-01-25 MED ORDER — ALBUTEROL SULFATE (2.5 MG/3ML) 0.083% IN NEBU: 2.5000 mg | INHALATION_SOLUTION | Freq: Four times a day (QID) | RESPIRATORY_TRACT | Status: DC | PRN

## 2018-01-25 MED ORDER — FAMOTIDINE 20 MG PO TABS
20.0000 mg | ORAL_TABLET | Freq: Two times a day (BID) | ORAL | Status: DC
  Administered 2018-01-25 – 2018-01-27 (×6): 20 mg via ORAL
  Filled 2018-01-25 (×6): qty 1

## 2018-01-25 MED ORDER — ASPIRIN EC 81 MG PO TBEC
81.0000 mg | DELAYED_RELEASE_TABLET | Freq: Every day | ORAL | Status: DC
  Administered 2018-01-25: 81 mg via ORAL
  Filled 2018-01-25: qty 1

## 2018-01-25 MED ORDER — POTASSIUM 99 MG PO TABS: 99.0000 mg | ORAL_TABLET | Freq: Every day | ORAL | Status: DC

## 2018-01-25 MED ORDER — INSULIN ASPART 100 UNIT/ML ~~LOC~~ SOLN
0.0000 [IU] | SUBCUTANEOUS | Status: DC
  Administered 2018-01-25: 8 [IU] via SUBCUTANEOUS
  Administered 2018-01-25: 5 [IU] via SUBCUTANEOUS
  Administered 2018-01-25: 11 [IU] via SUBCUTANEOUS
  Administered 2018-01-25: 8 [IU] via SUBCUTANEOUS
  Administered 2018-01-25: 5 [IU] via SUBCUTANEOUS
  Administered 2018-01-26 (×2): 8 [IU] via SUBCUTANEOUS
  Administered 2018-01-26 (×4): 3 [IU] via SUBCUTANEOUS
  Administered 2018-01-27 (×3): 2 [IU] via SUBCUTANEOUS

## 2018-01-25 MED ORDER — FUROSEMIDE 10 MG/ML IJ SOLN
40.0000 mg | Freq: Once | INTRAMUSCULAR | Status: AC
  Administered 2018-01-25: 40 mg via INTRAVENOUS
  Filled 2018-01-25: qty 4

## 2018-01-25 MED ORDER — LACTULOSE 10 GM/15ML PO SOLN
30.0000 g | Freq: Three times a day (TID) | ORAL | Status: DC
  Administered 2018-01-25 – 2018-01-27 (×6): 30 g via ORAL
  Filled 2018-01-25 (×7): qty 45

## 2018-01-25 MED ORDER — INSULIN GLARGINE 100 UNIT/ML ~~LOC~~ SOLN
10.0000 [IU] | Freq: Every day | SUBCUTANEOUS | Status: DC
  Administered 2018-01-25 – 2018-01-26 (×2): 10 [IU] via SUBCUTANEOUS
  Filled 2018-01-25 (×2): qty 0.1

## 2018-01-25 MED ORDER — PHYTONADIONE 5 MG PO TABS
5.0000 mg | ORAL_TABLET | Freq: Once | ORAL | Status: AC
  Administered 2018-01-25: 5 mg via ORAL
  Filled 2018-01-25: qty 1

## 2018-01-25 MED ORDER — ONDANSETRON HCL 4 MG/2ML IJ SOLN: 4.0000 mg | Freq: Four times a day (QID) | INTRAMUSCULAR | Status: DC | PRN

## 2018-01-25 NOTE — H&P (Addendum)
History and Physical    BIRD SWETZ WUJ:[id number] DOB: December 17, 1955 DOA: 01/24/2018  Referring MD/NP/PA: Dr. Delora Fuel PCP: Delsa Grana, PA-C  Patient coming from: Home via EMS  Chief Complaint: Leg swelling  I have personally briefly reviewed patient's old medical records in [place]   HPI: Adam Butler is a 62 y.o. 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  Patient just been seen in the emergency department on 6 days ago after being seen in by PCP, with complaints of bilateral lower extremity swelling after patient had reportedly been out of Lasix for the last month.  Patient was given 40 mg of Lasix IV and ultimately discharged home.  Since being home patient had picked up his prescription for Lasix and have been taking as prescribed.  However, has had worsening confusion and leg edema.  Associated symptoms include urinary frequency, abdominal pain, and continued shortness of breath with exertion.    ED Course: Upon admission into the emergency department patient was seen to be afebrile, pulse 15-27, all other vital signs maintained.  Labs revealed WBC 4.4, Hbg 10.8, Plt 39, Na 132, Potassium 3, glucose 449, ammonia 190.  Urinalysis shows glucosuria, negative ketones, moderate hemoglobin, and no signs of infection.  Chest x-ray was otherwise negative for any signs of infection.  Patient was given 30 g of lactulose and a total of 50 mEq of potassium chloride.  TRH called to admit.  Review of Systems  Unable to perform ROS: Mental status change  Constitutional: Positive for malaise/fatigue.  Eyes: Negative for double vision and photophobia.  Cardiovascular: Positive for leg swelling. Negative for chest pain.  Gastrointestinal: Positive for abdominal pain. Negative for nausea and vomiting.    Genitourinary: Positive for frequency. Negative for dysuria.  Musculoskeletal: Negative for joint pain.  Psychiatric/Behavioral: Negative for substance abuse.       Positive for confusion    Past Medical History:  Diagnosis Date  . Arthritis   . Cholelithiasis   . Cirrhosis ([place]) 2011   Cryptogenic, Likely NASH. Family/pt deny EtOH. HCV, HBV, HAV negative. ANA negative. AMA positive. Ascites 12/11  . Coronary artery disease    Inferior MI 12/11; LHC with occluded mid CFX and 80% proximal RCA. EF 55%. He had 3.0 x 28 vision BMS to CFX  . Diastolic CHF, acute ([place])    Echo 12/11 with ef 50-55% and mild LVH. EF 55% by LV0gram in 12/11  . Esophageal varices ([place]) 2011, 2013   no hx acute variceal bleed  . Hepatic encephalopathy ([place]) 2011, 12/2013  . Hyperlipemia   . Myocardial infarction [facility]) ? 2012  . PFO (patent foramen ovale): Per TEE 04/20/2015 04/20/2015  . Portal hypertension ([place])   . Rectal varices   . S/P coronary artery stent placement 06/2010  . SVT (supraventricular tachycardia) ([place])    1/12: appeared to be an ectopic atrial tachycardia. Required DCCV with hemodynamic instability  . Type II diabetes mellitus ([place]) 2011    Past Surgical History:  Procedure Laterality Date  . APPENDECTOMY    . BIOPSY  01/02/2018   Procedure: BIOPSY;  Surgeon: Jerene Bears, MD;  Location: [facility];  Service: Gastroenterology;;  . CARDIAC CATHETERIZATION  07/01/2010   BMS to CFX.  Marland Kitchen CATARACT EXTRACTION W/PHACO Left 04/28/2016   Procedure: CATARACT EXTRACTION PHACO AND INTRAOCULAR LENS PLACEMENT (IOC);  Surgeon:  Leandrew Koyanagi, MD;  Location: [facility] ORS;  Service: Ophthalmology;  Laterality: Left;  Lot# [id number] H [place]: 05:31.7 AP%: 28.2 CDE: 93.48   . COLONOSCOPY  04/13/2012   Procedure: COLONOSCOPY;  Surgeon: Inda Castle, MD;  Location: [facility];  Service: Endoscopy;  Laterality: N/A;  . COLONOSCOPY WITH PROPOFOL N/A 03/22/2017   Procedure: COLONOSCOPY WITH PROPOFOL;   Surgeon: Doran Stabler, MD;  Location: [facility];  Service: Gastroenterology;  Laterality: N/A;  . COLONOSCOPY WITH PROPOFOL N/A 01/02/2018   Procedure: COLONOSCOPY WITH PROPOFOL;  Surgeon: Jerene Bears, MD;  Location: [facility];  Service: Gastroenterology;  Laterality: N/A;  . CORONARY ANGIOPLASTY    . CORONARY STENT PLACEMENT  06/30/2010   CFX   Distal        . ESOPHAGEAL BANDING N/A 01/01/2014   Procedure: ESOPHAGEAL BANDING;  Surgeon: Jerene Bears, MD;  Location: [facility];  Service: Endoscopy;  Laterality: N/A;  . ESOPHAGOGASTRODUODENOSCOPY  04/13/2012   Procedure: ESOPHAGOGASTRODUODENOSCOPY (EGD);  Surgeon: Inda Castle, MD;  Location: [facility];  Service: Endoscopy;  Laterality: N/A;  . ESOPHAGOGASTRODUODENOSCOPY N/A 01/01/2014   Procedure: ESOPHAGOGASTRODUODENOSCOPY (EGD);  Surgeon: Jerene Bears, MD;  Location: [facility] ENDOSCOPY;  Service: Endoscopy;  Laterality: N/A;  . ESOPHAGOGASTRODUODENOSCOPY (EGD) WITH PROPOFOL N/A 07/12/2016   Procedure: ESOPHAGOGASTRODUODENOSCOPY (EGD) WITH PROPOFOL;  Surgeon: Jerene Bears, MD;  Location: [facility];  Service: Gastroenterology;  Laterality: N/A;  . ESOPHAGOGASTRODUODENOSCOPY (EGD) WITH PROPOFOL N/A 01/02/2018   Procedure: ESOPHAGOGASTRODUODENOSCOPY (EGD) WITH PROPOFOL;  Surgeon: Jerene Bears, MD;  Location: [facility];  Service: Gastroenterology;  Laterality: N/A;  . LEFT HEART CATHETERIZATION WITH CORONARY ANGIOGRAM N/A 10/15/2014   Procedure: LEFT HEART CATHETERIZATION WITH CORONARY ANGIOGRAM;  Surgeon: Peter M Martinique, MD;  Location: [facility] CATH LAB;  Service: Cardiovascular;  Laterality: N/A;  . orif r leg    . POLYPECTOMY  01/02/2018   Procedure: POLYPECTOMY;  Surgeon: Jerene Bears, MD;  Location: [facility];  Service: Gastroenterology;;  . TEE WITHOUT CARDIOVERSION N/A 04/20/2015   Procedure: TRANSESOPHAGEAL ECHOCARDIOGRAM (TEE);  Surgeon: Fay Records, MD;  Location: [facility];  Service: Cardiovascular;  Laterality: N/A;  .  TEE WITHOUT CARDIOVERSION N/A 05/17/2017   Procedure: TRANSESOPHAGEAL ECHOCARDIOGRAM (TEE);  Surgeon: Josue Hector, MD;  Location: [facility] ENDOSCOPY;  Service: Cardiovascular;  Laterality: N/A;     reports that he has been smoking e-cigarettes and cigarettes.  He has a 9.00 pack-year smoking history. He has never used smokeless tobacco. He reports that he does not drink alcohol or use drugs.  Allergies  Allergen Reactions  . Isosorbide Nitrate Other (See Comments)    Nose bleeds    Family History  Problem Relation Age of Onset  . Heart attack Brother 32       MI  . Diabetes Brother   . Heart attack Father   . Diabetes Father   . COPD Father   . COPD Mother   . Heart attack Brother   . Stroke Neg Hx     Prior to Admission medications   Medication Sig Start Date End Date Taking? Authorizing Provider  aspirin EC 81 MG tablet Take 81 mg by mouth daily.    [provider]  atorvastatin (LIPITOR) 10 MG tablet TAKE 1 TABLET BY MOUTH ONCE DAILY 11/21/17   [name], NP  cyanocobalamin 1000 MCG tablet Take 1,000 mcg by mouth daily. Vitamin B12    [provider]  fluticasone (FLONASE) 50 MCG/ACT nasal spray Place 2 sprays  into both nostrils daily as needed for allergies or rhinitis.     [provider]  furosemide (LASIX) 20 MG tablet Take 1 tablet (20 mg total) by mouth daily. Patient not taking: Reported on 01/19/2018 12/22/17   Delsa Grana, PA-C  insulin regular (NOVOLIN R,HUMULIN R) 100 units/mL injection Inject 2-8 Units into the skin 3 (three) times daily as needed for high blood sugar (CBG >150).     [provider]  lactulose, encephalopathy, (CHRONULAC) 10 GM/15ML SOLN  TAKE 30 MLS BY MOUTH AT BEDTIME 01/22/18   Pyrtle, Lajuan Lines, MD  levofloxacin (LEVAQUIN) 500 MG tablet Take 500 mg by mouth daily.  10/24/17   [provider]  Multiple Vitamin (MULTIVITAMIN WITH MINERALS) TABS tablet Take 1 tablet by mouth daily.    [provider]   nitroGLYCERIN (NITROSTAT) 0.4 MG SL tablet Place 1 tablet (0.4 mg total) under the tongue every 5 (five) minutes as needed for chest pain (chest pain). Patient not taking: Reported on 01/19/2018 07/19/16   [name], NP  Potassium 99 MG TABS Take 99 mg by mouth daily.    [provider]  ranitidine (ZANTAC) 150 MG tablet Take 150 mg by mouth 2 (two) times daily as needed for heartburn.     [provider]  spironolactone (ALDACTONE) 25 MG tablet Take 1 tablet (25 mg total) by mouth daily. Patient not taking: Reported on 01/19/2018 07/28/17   Jerene Bears, MD    Physical Exam:  Constitutional: Obese male in NAD, calm, comfortable Vitals:   01/24/18 2145 01/24/18 2200 01/24/18 2230 01/24/18 2245  BP: 131/66 117/62 (!) 134/56 (!) 133/51  Pulse: 75 73 77 77  Resp: (!) 21 17 20  (!) 21  Temp:      TempSrc:      SpO2: 94% 96% 96% 96%  Weight:      Height:       Eyes: PERRL, lids and conjunctivae normal ENMT: Mucous membranes are moist. Posterior pharynx clear of any exudate or lesions. Neck: normal, supple, no masses, no thyromegaly Respiratory: clear to auscultation bilaterally, no wheezing, no crackles. Normal respiratory effort. No accessory muscle use.  Cardiovascular: Regular rate and rhythm, no murmurs / rubs / gallops.  +2 pitting edema of the lower extremities 2+ pedal pulses. No carotid bruits.  Abdomen: Protuberant abdomen, with mild suprapubic tenderness, no masses palpated. No hepatosplenomegaly. Bowel sounds positive.  Musculoskeletal: no clubbing / cyanosis. No joint deformity upper and lower extremities. Good ROM, no contractures. Normal muscle tone.  Skin: no rashes, lesions, ulcers. No induration Neurologic: CN 2-12 grossly intact. Sensation intact, DTR normal. Strength 5/5 in all 4.  Psychiatric:  Alert and oriented x 2, but intermittently is confused. Normal mood.     Labs on Admission: I have personally reviewed following labs and imaging  studies  CBC: Recent Labs  Lab 01/19/18 1700 01/24/18 1915  WBC 4.4 4.4  HGB 11.2* 10.8*  HCT 31.1* 32.2*  MCV 96.3 100.3*  PLT 40* 39*   Basic Metabolic Panel: Recent Labs  Lab 01/19/18 1700 01/24/18 1915  NA 133* 132*  K 3.0* 3.0*  CL 106 101  CO2 23 25  GLUCOSE 303* 449*  BUN 6* 6*  CREATININE 0.63 0.79  CALCIUM 7.4* 7.9*   GFR: Estimated Creatinine Clearance: 119.7 mL/min (by C-G formula based on SCr of 0.79 mg/dL). Liver Function Tests: Recent Labs  Lab 01/19/18 1700 01/24/18 1915  AST 68* 49*  ALT 40 33  ALKPHOS 228*  258*  BILITOT 2.2* 2.4*  PROT 6.2* 5.9*  ALBUMIN 2.1* 2.0*   Recent Labs  Lab 01/24/18 1915  LIPASE 59*   Recent Labs  Lab 01/19/18 1700 01/24/18 2116  AMMONIA 111* 190*   Coagulation Profile: No results for input(s): INR, PROTIME in the last 168 hours. Cardiac Enzymes: Recent Labs  Lab 01/19/18 1700  TROPONINI 0.03*   BNP (last 3 results) No results for input(s): PROBNP in the last 8760 hours. HbA1C: No results for input(s): HGBA1C in the last 72 hours. CBG: No results for input(s): GLUCAP in the last 168 hours. Lipid Profile: No results for input(s): CHOL, HDL, LDLCALC, TRIG, CHOLHDL, LDLDIRECT in the last 72 hours. Thyroid Function Tests: No results for input(s): TSH, T4TOTAL, FREET4, T3FREE, THYROIDAB in the last 72 hours. Anemia Panel: No results for input(s): VITAMINB12, FOLATE, FERRITIN, TIBC, IRON, RETICCTPCT in the last 72 hours. Urine analysis:    Component Value Date/Time   COLORURINE YELLOW 01/24/2018 2116   APPEARANCEUR CLEAR 01/24/2018 2116   LABSPEC 1.021 01/24/2018 2116   PHURINE 7.0 01/24/2018 2116   GLUCOSEU >=500 (A) 01/24/2018 2116   HGBUR MODERATE (A) 01/24/2018 2116   BILIRUBINUR NEGATIVE 01/24/2018 2116   KETONESUR NEGATIVE 01/24/2018 2116   PROTEINUR NEGATIVE 01/24/2018 2116   UROBILINOGEN 0.2 05/14/2015 1813   NITRITE NEGATIVE 01/24/2018 2116   LEUKOCYTESUR NEGATIVE 01/24/2018 2116    Sepsis Labs: No results found for this or any previous visit (from the past 240 hour(s)).   Radiological Exams on Admission: No results found.   Assessment/Plan Decompensated liver cirrhosis with hepatic encephalopathy: Acute.  Patient with history of cryptogenic liver cirrhosis thought to be [name] with history of esophageal varices; who presents with  increasing confusion and lower extremity swelling after being off Lasix for approximately 1 month prior to recently being restarted on Lasix.  Ammonia level increased from 111 to 190 on admission.  - Admit to a telemetry bed - Neurochecks - Check INR - Lactulose 30 g 3 times daily - Check ammonia levels daily- Elevate lower extremities  - Did not give IV Lasix initially as this can worsen hepatic encephalopathy  Hyperglycemia with diabetes mellitus type 2: Acute.  Patient presents with a blood glucose elevated up to 449 on admission without elevated anion gap.  Last hemoglobin A1c noted to be 8.1 on 11/02/2017. - Hypoglycemic protocol - Give 10 units of NovoLog insulin" dose now - CBGs every 4 hours with moderate SSI  Hypokalemia: Acute.  Initial potassium 3 on admission.  Patient given total of 50 mEq potassium chloride I in the ED - Continue scheduled potassium supplement - Continue to monitor and replace as needed  Macrocytic anemia: Stable. - Continue to monitor  Thrombocytopenia: Chronic.  Platelet count 39 appears stable.  Patient gives no reports of bleeding. - Continue to monitor   GERD - Continue pharmacy substitution of Pepcid for Zantac  DVT prophylaxis: SCD Code Status: Full Family Communication: No family present at bedside Disposition Plan: Likely discharge once medically stable Consults called: None Admission status: Inpatient  Norval Morton MD [facility] Pager [phone]   If 7PM-7AM, please contact night-coverage [email or web address] Password [facility]  01/25/2018, 12:54 AM

## 2018-01-25 NOTE — Progress Notes (Signed)
Pt ambulatory on unit and in hallways independently. Pt denies any pain or discomfort. Will continue to closely monitor. Delia Heady RN

## 2018-01-25 NOTE — ED Provider Notes (Signed)
[facility] EMERGENCY DEPARTMENT Provider Note   CSN: [id number] Arrival date & time: 01/24/18  1904     History   Chief Complaint Chief Complaint  Patient presents with  . Leg Swelling    HPI Adam Butler is a 62 y.o. male.  The history is provided by the patient and the spouse. The history is limited by the condition of the patient (Confusion).  He has history of cryptogenic cirrhosis, diastolic heart failure, coronary artery disease, hepatic encephalopathy, hyperlipidemia, diabetes and is brought in by family because of increased leg edema and confusion.  He had stopped taking his furosemide for about 1 month and was seen by PCP last week who restarted furosemide but sent to [place] emergency department to evaluate labs first.  Ammonia level was noted to be 110, but he was felt to be at baseline mental status and was started back on furosemide.  He was also hypokalemic at that time and was given a dose of potassium.  He had been doing well until today when family noted increased leg swelling and confusion.  He has not had any fever, chills, sweats.  There has been no nausea or vomiting.  Past Medical History:  Diagnosis Date  . Arthritis   . Cholelithiasis   . Cirrhosis ([place]) 2011   Cryptogenic, Likely NASH. Family/pt deny EtOH. HCV, HBV, HAV negative. ANA negative. AMA positive. Ascites 12/11  . Coronary artery disease    Inferior MI 12/11; LHC with occluded mid CFX and 80% proximal RCA. EF 55%. He had 3.0 x 28 vision BMS to CFX  . Diastolic CHF, acute ([place])    Echo 12/11 with ef 50-55% and mild LVH. EF 55% by LV0gram in 12/11  . Esophageal varices ([place]) 2011, 2013   no hx acute variceal bleed  . Hepatic encephalopathy ([place]) 2011, 12/2013  . Hyperlipemia   . Myocardial infarction [facility]) ? 2012  . PFO (patent foramen ovale): Per TEE 04/20/2015 04/20/2015  . Portal hypertension ([place])   . Rectal varices   . S/P coronary artery stent  placement 06/2010  . SVT (supraventricular tachycardia) ([place])    1/12: appeared to be an ectopic atrial tachycardia. Required DCCV with hemodynamic instability  . Type II diabetes mellitus ([place]) 2011    Patient Active Problem List   Diagnosis Date Noted  . History of colonic polyps   . Benign neoplasm of ascending colon   . Noninfectious gastroenteritis   . Chronic diastolic CHF (congestive heart failure) ([place])   . Thoracic ascending aortic aneurysm ([place])   . OA (osteoarthritis) of knee 07/19/2016  . Hepatic encephalopathy ([place]) 12/16/2015  . GERD (gastroesophageal reflux disease) 09/29/2015  . Type 2 diabetes mellitus ([place]) 08/18/2015  . CAD in native artery   . Liver cirrhosis secondary to NASH ([place]) 06/06/2015  . Tobacco abuse 05/04/2015  . PFO (patent foramen ovale): Per TEE 04/20/2015 04/20/2015  . Esophageal varices without bleeding ([place]) 08/16/2010    Past Surgical History:  Procedure Laterality Date  . APPENDECTOMY    . BIOPSY  01/02/2018   Procedure: BIOPSY;  Surgeon: Jerene Bears, MD;  Location: [facility];  Service: Gastroenterology;;  . CARDIAC CATHETERIZATION  07/01/2010   BMS to CFX.  Marland Kitchen CATARACT EXTRACTION W/PHACO Left 04/28/2016   Procedure: CATARACT EXTRACTION PHACO AND INTRAOCULAR LENS PLACEMENT (IOC);  Surgeon: Leandrew Koyanagi, MD;  Location: [facility] ORS;  Service: Ophthalmology;  Laterality: Left;  Lot# [id number] H [place]: 05:31.7 AP%: 28.2 CDE: 93.48   .  COLONOSCOPY  04/13/2012   Procedure: COLONOSCOPY;  Surgeon: Inda Castle, MD;  Location: [facility];  Service: Endoscopy;  Laterality: N/A;  . COLONOSCOPY WITH PROPOFOL N/A 03/22/2017   Procedure: COLONOSCOPY WITH PROPOFOL;  Surgeon: Doran Stabler, MD;  Location: [facility];  Service: Gastroenterology;  Laterality: N/A;  . COLONOSCOPY WITH PROPOFOL N/A 01/02/2018   Procedure: COLONOSCOPY WITH PROPOFOL;  Surgeon: Jerene Bears, MD;  Location: [facility];  Service: Gastroenterology;  Laterality: N/A;   . CORONARY ANGIOPLASTY    . CORONARY STENT PLACEMENT  06/30/2010   CFX   Distal        . ESOPHAGEAL BANDING N/A 01/01/2014   Procedure: ESOPHAGEAL BANDING;  Surgeon: Jerene Bears, MD;  Location: [facility] ENDOSCOPY;  Service: Endoscopy;  Laterality: N/A;  . ESOPHAGOGASTRODUODENOSCOPY  04/13/2012   Procedure: ESOPHAGOGASTRODUODENOSCOPY (EGD);  Surgeon: Inda Castle, MD;  Location: [facility];  Service: Endoscopy;  Laterality: N/A;  . ESOPHAGOGASTRODUODENOSCOPY N/A 01/01/2014   Procedure: ESOPHAGOGASTRODUODENOSCOPY (EGD);  Surgeon: Jerene Bears, MD;  Location: [facility] ENDOSCOPY;  Service: Endoscopy;  Laterality: N/A;  . ESOPHAGOGASTRODUODENOSCOPY (EGD) WITH PROPOFOL N/A 07/12/2016   Procedure: ESOPHAGOGASTRODUODENOSCOPY (EGD) WITH PROPOFOL;  Surgeon: Jerene Bears, MD;  [facility];  Service: Gastroenterology;  Laterality: N/A;  . ESOPHAGOGASTRODUODENOSCOPY (EGD) WITH PROPOFOL N/A 01/02/2018   Procedure: ESOPHAGOGASTRODUODENOSCOPY (EGD) WITH PROPOFOL;  Surgeon: Jerene Bears, MD;  [facility];  Service: Gastroenterology;  Laterality: N/A;  . LEFT HEART CATHETERIZATION WITH CORONARY ANGIOGRAM N/A 10/15/2014   Procedure: LEFT HEART CATHETERIZATION WITH CORONARY ANGIOGRAM;  Surgeon: Peter M Martinique, MD;  Location: [facility] C[facility] LAB;  Service: Cardiovascular;  Laterality: N/A;  . orif r leg    . POLYPECTOMY  01/02/2018   Procedure: POLYPECTOMY;  Surgeon: Jerene Bears, MD;  Location: [facility];  Service: Gastroenterology;;  . TEE WITHOUT CARDIOVERSION N/A 04/20/2015   Procedure: TRANSESOPHAGEAL ECHOCARDIOGRAM (TEE);  Surgeon: Fay Records, MD;  Location: [facility];  Service: Cardiovascular;  Laterality: N/A;  . TEE WITHOUT CARDIOVERSION N/A 05/17/2017   Procedure: TRANSESOPHAGEAL ECHOCARDIOGRAM (TEE);  Surgeon: Josue Hector, MD;  Location: [facility] ENDOSCOPY;  Service: Cardiovascular;  Laterality: N/A;        Home Medications    Prior to Admission medications   Medication Sig Start Date End  Date Taking? Authorizing Provider  aspirin EC 81 MG tablet Take 81 mg by mouth daily.    [provider]  atorvastatin (LIPITOR) 10 MG tablet TAKE 1 TABLET BY MOUTH ONCE DAILY 11/21/17   [name], NP  cyanocobalamin 1000 MCG tablet Take 1,000 mcg by mouth daily. Vitamin B12    [provider]  fluticasone (FLONASE) 50 MCG/ACT nasal spray Place 2 sprays into both nostrils daily as needed for allergies or rhinitis.     [provider]  furosemide (LASIX) 20 MG tablet Take 1 tablet (20 mg total) by mouth daily. Patient not taking: Reported on 01/19/2018 12/22/17   Delsa Grana, PA-C  insulin regular (NOVOLIN R,HUMULIN R) 100 units/mL injection Inject 2-8 Units into the skin 3 (three) times daily as needed for high blood sugar (CBG >150).     [provider]  lactulose, encephalopathy, (CHRONULAC) 10 GM/15ML SOLN  TAKE 30 MLS BY MOUTH AT BEDTIME 01/22/18   Pyrtle, Lajuan Lines, MD  levofloxacin (LEVAQUIN) 500 MG tablet Take 500 mg by mouth daily.  10/24/17   [provider]  Multiple Vitamin (MULTIVITAMIN WITH MINERALS) TABS tablet Take 1 tablet by mouth daily.  [provider]  nitroGLYCERIN (NITROSTAT) 0.4 MG SL tablet Place 1 tablet (0.4 mg total) under the tongue every 5 (five) minutes as needed for chest pain (chest pain). Patient not taking: Reported on 01/19/2018 07/19/16   [name], NP  Potassium 99 MG TABS Take 99 mg by mouth daily.    [provider]  ranitidine (ZANTAC) 150 MG tablet Take 150 mg by mouth 2 (two) times daily as needed for heartburn.     [provider]  spironolactone (ALDACTONE) 25 MG tablet Take 1 tablet (25 mg total) by mouth daily. Patient not taking: Reported on 01/19/2018 07/28/17   Pyrtle, Lajuan Lines, MD    Family History Family History  Problem Relation Age of Onset  . Heart attack Brother 73       MI  . Diabetes Brother   . Heart attack Father   . Diabetes Father   . COPD Father   . COPD  Mother   . Heart attack Brother   . Stroke Neg Hx     Social History Social History   Tobacco Use  . Smoking status: Current Every Day Smoker    Packs/day: 0.25    Years: 36.00    Pack years: 9.00    Types: E-cigarettes, Cigarettes  . Smokeless tobacco: Never Used  . Tobacco comment: uses vapor cigarettes (2016 ); I smoke about 4 cigarettes a day  Substance Use Topics  . Alcohol use: No    Alcohol/week: 0.0 oz  . Drug use: No     Allergies   Isosorbide nitrate   Review of Systems Review of Systems  Unable to perform ROS: Mental status change     Physical Exam Updated Vital Signs BP (!) 133/51   Pulse 77   Temp 98.3 F (36.8 C) (Oral)   Resp (!) 21   Ht 5' 6"  (1.676 m)   Wt 122.5 kg (270 lb)   SpO2 96%   BMI 43.58 kg/m   Physical Exam  Nursing note and vitals reviewed.  Obese 62 year old male, resting comfortably and in no acute distress. Vital signs are normal. Oxygen saturation is 96%, which is normal. Head is normocephalic and atraumatic. PERRLA, EOMI. Oropharynx is clear. Neck is nontender and supple without adenopathy or JVD. Back is nontender and there is no CVA tenderness. Lungs are clear without rales, wheezes, or rhonchi. Chest is nontender. Heart has regular rate and rhythm without murmur. Abdomen is soft, flat, nontender without masses or hepatosplenomegaly and peristalsis is normoactive. Extremities have 3+ pretibial edema, full range of motion is present.  No presacral edema identified. Skin is warm and dry without rash. Neurologic: Mental status is normal, cranial nerves are intact, there are no motor or sensory deficits.  Asterixis present.  ED Treatments / Results  Labs (all labs ordered are listed, but only abnormal results are displayed) Labs Reviewed  LIPASE, BLOOD - Abnormal; Notable for the following components:      Result Value   Lipase 59 (*)    All other components within normal limits  COMPREHENSIVE METABOLIC PANEL -  Abnormal; Notable for the following components:   Sodium 132 (*)    Potassium 3.0 (*)    Glucose, Bld 449 (*)    BUN 6 (*)    Calcium 7.9 (*)    Total Protein 5.9 (*)    Albumin 2.0 (*)    AST 49 (*)    Alkaline Phosphatase 258 (*)    Total Bilirubin 2.4 (*)  All other components within normal limits  CBC - Abnormal; Notable for the following components:   RBC 3.21 (*)    Hemoglobin 10.8 (*)    HCT 32.2 (*)    MCV 100.3 (*)    Platelets 39 (*)    All other components within normal limits  URINALYSIS, ROUTINE W REFLEX MICROSCOPIC - Abnormal; Notable for the following components:   Glucose, UA >=500 (*)    Hgb urine dipstick MODERATE (*)    All other components within normal limits  AMMONIA - Abnormal; Notable for the following components:   Ammonia 190 (*)    All other components within normal limits  URINE CULTURE  I-STAT TROPONIN, ED   Procedures Procedures   Medications Ordered in ED Medications  potassium chloride 10 mEq in 100 mL IVPB (has no administration in time range)  potassium chloride SA (K-DUR,KLOR-CON) CR tablet 40 mEq (has no administration in time range)  lactulose (CHRONULAC) 10 GM/15ML solution 30 g (has no administration in time range)     Initial Impression / Assessment and Plan / ED Course  I have reviewed the triage vital signs and the nursing notes.  Pertinent lab results that were available during my care of the patient were reviewed by me and considered in my medical decision making (see chart for details).  Cirrhosis with hepatic encephalopathy and peripheral edema.  Old records are reviewed confirming recent ED visit with ammonia level of 110 and potassium of 3.0.  Today, ammonia is up to 190 which is consistent with his clinical presentation.  He is given a dose of oral lactulose.  Potassium is still low at 3.0, so he is given oral and intravenous potassium.  Significant peripheral edema is present, and he will need additional diuretics.  Case  is discussed with Dr. Tamala Julian, of [facility], who agrees to admit the patient.  Final Clinical Impressions(s) / ED Diagnoses   Final diagnoses:  Hepatic encephalopathy ([place])  Peripheral edema  Hypokalemia  Macrocytic anemia  Thrombocytopenia [facility])    ED Discharge Orders    None       Delora Fuel, MD 78/67/54 [phone]

## 2018-01-25 NOTE — ED Notes (Signed)
MD and family notified of wait time to see provider.

## 2018-01-25 NOTE — Consult Note (Signed)
   [facility] CM Inpatient Consult   01/25/2018  Adam Butler 1955/08/15 [id number]   Patient was assessed for [place] [facility] for community services.  Patient in the [employer] plan. Patient was previously active with [facility].  Met with patient at bedside regarding being restarted with [facility] services. Patient had move to a new room and was having back pain issues with the bed.  Requested nursing staff to get in chair.  Patient admitted with Hepatic encephalopathy secondary to Liver cirrhosis [NASH] per MD notes. Patient currently alert and answering questions appropriately but having back pain. He is agreeable to post hospital follow up with [facility].  His primary care provider [facility] ([place] is listed to provide the post hospital transition of care follow up.  Will follow for progress and needs.  Of note, [facility] Care Management services does not replace or interfere with any services that are arranged by inpatient case management or social work. For additional questions or referrals please contact:   Natividad Brood, RN BSN [facility] Liaison  [phone] business mobile phone Toll free office [phone]

## 2018-01-25 NOTE — Plan of Care (Signed)
  Problem: Clinical Measurements: Goal: Ability to maintain clinical measurements within normal limits will improve Outcome: Progressing   Problem: Clinical Measurements: Goal: Diagnostic test results will improve Outcome: Progressing   Problem: Clinical Measurements: Goal: Cardiovascular complication will be avoided Outcome: Progressing   

## 2018-01-25 NOTE — Progress Notes (Signed)
Telemetry called RN to inform her pt having vent bigeminy in pairs. Pt asymptomatic when assessed. MD notified and new orders received for BMET and Mg IV. MD ordered for RN to call her back with BMET results. MD paged and notified of results and new orders received. Will continue to closely monitor pt. Delia Heady RN

## 2018-01-25 NOTE — ED Notes (Signed)
Patient ambulatory to bathroom with steady gait at this time 

## 2018-01-25 NOTE — Progress Notes (Signed)
Pt is alert with poor judgement and confusion/forgetfulness uses a cane, reports falling at '[place]' not related to drinking alcohol per patient. Lives ina camper with wife([place]oa). No problems with steps vital stable plan watch labs and decrease ammonia levels.

## 2018-01-25 NOTE — Progress Notes (Signed)
PROGRESS NOTE    Adam Butler  EXN:[id number] DOB: April 08, 1956 DOA: 01/24/2018 PCP: Delsa Grana, PA-C    Brief Narrative: Adam Butler is a 62 y.o. 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  Patient just been seen in the emergency department on 6 days ago after being seen in by PCP, with complaints of bilateral lower extremity swelling after patient had reportedly been out of Lasix for the last month.  Patient was given 40 mg of Lasix IV and ultimately discharged home.  Since being home patient had picked up his prescription for Lasix and have been taking as prescribed.  However, has had worsening confusion and leg edema.  Associated symptoms include urinary frequency, abdominal pain, and continued shortness of breath with exertion.    ED Course: Upon admission into the emergency department patient was seen to be afebrile, pulse 15-27, all other vital signs maintained.  Labs revealed WBC 4.4, Hbg 10.8, Plt 39, Na 132, Potassium 3, glucose 449, ammonia 190.  Urinalysis shows glucosuria, negative ketones, moderate hemoglobin, and no signs of infection.  Chest x-ray was otherwise negative for any signs of infection.  Patient was given 30 g of lactulose and a total of 50 mEq of potassium chloride.  TR[facility] called to admit.  Assessment & Plan:   Principal Problem:   Hepatic encephalopathy (HCC) Active Problems:   Liver cirrhosis secondary to NASH (HCC)   Hypokalemia   Type 2 diabetes mellitus ([place])  1-Acute hepatic encephalopathy;  Ammonia level decreased from 190---120.  Continue with lactulose.   2-Cirrhosis liver, NASH, history of esophageal varices;  LE edema;  Resume spironolactone.  Will give one time dose of lasix.  Continue with lactulose.  Discontinue statins.  Will give dose vitamin k.  Follow  LFT.   DM, hyperglycemia;  SSI.    Anemia; stable. Monitor.   Hypokalemia; replete orally.   Thrombocytopenia; stable.  Chronic Discontinue aspirin.     DVT prophylaxis: scd.  Code Status: full code.  Family Communication: no family at bedside.  Disposition Plan: home when encephalopathy resolved.     Consultants:   none   Procedures: \  none   Antimicrobials:  Levaquin, chronically.    Subjective: Feeling ok, oriented to place, person, confuse.  Has had multiple BM per nurse.,  He is complaining of LE edema.   Objective: Vitals:   01/25/18 0300 01/25/18 0359 01/25/18 0401 01/25/18 0907  BP: (!) 111/57 (!) 82/68 110/74 (!) 122/52  Pulse: 86 82 85 89  Resp: (!) 21  (!) 22 20  Temp:  97.7 F (36.5 C) 97.7 F (36.5 C) 98.5 F (36.9 C)  TempSrc:  Oral Oral Oral  SpO2: 97% 99% 99%   Weight:   122.1 kg (269 lb 1.6 oz)   Height:        Intake/Output Summary (Last 24 hours) at 01/25/2018 0957 Last data filed at 01/25/2018 0631 Gross per 24 hour  Intake 323.1 ml  Output 300 ml  Net 23.1 ml   Filed Weights   01/24/18 1917 01/25/18 0401  Weight: 122.5 kg (270 lb) 122.1 kg (269 lb 1.6 oz)    Examination:  General exam: Appears calm and comfortable  Respiratory system: Clear to auscultation. Respiratory effort normal. Cardiovascular system: S1 & S2 heard, RRR. No JVD, murmurs, rubs, gallops or clicks. No pedal edema. Gastrointestinal system: Abdomen is  nondistended, soft and nontender. No organomegaly or masses felt. Normal bowel sounds heard. Central nervous system: Alert and oriented times 2 Extremities: Symmetric 5 x 5 power. Plus 2 edema Skin: No rashes, lesions or ulcers    Data Reviewed: I have personally reviewed following labs and imaging studies  CBC: Recent Labs  Lab 01/19/18 1700 01/24/18 1915 01/25/18 0436  WBC 4.4 4.4 5.4  HGB 11.2* 10.8* 11.0*  HCT 31.1* 32.2* 32.8*  MCV 96.3 100.3* 99.4  PLT 40* 39* 37*   Basic Metabolic  Panel: Recent Labs  Lab 01/19/18 1700 01/24/18 1915 01/25/18 0436  NA 133* 132* 138  K 3.0* 3.0* 3.2*  CL 106 101 106  CO2 23 25 27   GLUCOSE 303* 449* 248*  BUN 6* 6* 6*  CREATININE 0.63 0.79 0.67  CALCIUM 7.4* 7.9* 8.0*   GFR: Estimated Creatinine Clearance: 119.5 mL/min (by C-G formula based on SCr of 0.67 mg/dL). Liver Function Tests: Recent Labs  Lab 01/19/18 1700 01/24/18 1915 01/25/18 0436  AST 68* 49* 60*  ALT 40 33 33  ALKPHOS 228* 258* 217*  BILITOT 2.2* 2.4* 2.6*  PROT 6.2* 5.9* 5.9*  ALBUMIN 2.1* 2.0* 2.0*   Recent Labs  Lab 01/24/18 1915  LIPASE 59*   Recent Labs  Lab 01/19/18 1700 01/24/18 2116 01/25/18 0436  AMMONIA 111* 190* 120*   Coagulation Profile: Recent Labs  Lab 01/25/18 0806  INR 1.91   Cardiac Enzymes: Recent Labs  Lab 01/19/18 1700  TROPONINI 0.03*   BNP (last 3 results) No results for input(s): PROBNP in the last 8760 hours. HbA1C: No results for input(s): HGBA1C in the last 72 hours. CBG: Recent Labs  Lab 01/25/18 0241 01/25/18 0403 01/25/18 0759  GLUCAP 271* 237* 233*   Lipid Profile: No results for input(s): CHOL, HDL, LDLCALC, TRIG, CHOLHDL, LDLDIRECT in the last 72 hours. Thyroid Function Tests: No results for input(s): TSH, T4TOTAL, FREET4, T3FREE, THYROIDAB in the last 72 hours. Anemia Panel: No results for input(s): VITAMINB12, FOLATE, FERRITIN, TIBC, IRON, RETICCTPCT in the last 72 hours. Sepsis Labs: No results for input(s): PROCALCITON, LATICACIDVEN in the last 168 hours.  No results found for this or any previous visit (from the past 240 hour(s)).       Radiology Studies: No results found.      Scheduled Meds: . aspirin EC  81 mg Oral Daily  . famotidine  20 mg Oral BID  . insulin aspart  0-15 Units Subcutaneous Q4H  . lactulose  30 g Oral TID  . levofloxacin  500 mg Oral Daily  . multivitamin with minerals  1 tablet Oral Daily  . potassium chloride  20 mEq Oral Daily  .  cyanocobalamin  1,000 mcg Oral Daily   Continuous Infusions:   LOS: 0 days    Time spent: 35 minutes.     Elmarie Shiley, MD [facility] Pager [phone]  If 7PM-7AM, please contact night-coverage [email or web address] Password [facility] 01/25/2018, 9:57 AM

## 2018-01-26 LAB — GLUCOSE, CAPILLARY
GLUCOSE-CAPILLARY: 186 mg/dL — AB (ref 70–99)
Glucose-Capillary: 165 mg/dL — ABNORMAL HIGH (ref 70–99)
Glucose-Capillary: 192 mg/dL — ABNORMAL HIGH (ref 70–99)
Glucose-Capillary: 193 mg/dL — ABNORMAL HIGH (ref 70–99)
Glucose-Capillary: 252 mg/dL — ABNORMAL HIGH (ref 70–99)
Glucose-Capillary: 263 mg/dL — ABNORMAL HIGH (ref 70–99)

## 2018-01-26 LAB — HEPATIC FUNCTION PANEL
ALK PHOS: 157 U/L — AB (ref 38–126)
ALT: 35 U/L (ref 0–44)
AST: 60 U/L — ABNORMAL HIGH (ref 15–41)
Albumin: 1.9 g/dL — ABNORMAL LOW (ref 3.5–5.0)
BILIRUBIN DIRECT: 0.7 mg/dL — AB (ref 0.0–0.2)
BILIRUBIN INDIRECT: 2.5 mg/dL — AB (ref 0.3–0.9)
BILIRUBIN TOTAL: 3.2 mg/dL — AB (ref 0.3–1.2)
Total Protein: 5.6 g/dL — ABNORMAL LOW (ref 6.5–8.1)

## 2018-01-26 LAB — BASIC METABOLIC PANEL
Anion gap: 8 (ref 5–15)
BUN: 6 mg/dL — AB (ref 8–23)
CALCIUM: 8 mg/dL — AB (ref 8.9–10.3)
CHLORIDE: 107 mmol/L (ref 98–111)
CO2: 24 mmol/L (ref 22–32)
CREATININE: 0.73 mg/dL (ref 0.61–1.24)
GFR calc Af Amer: >60 mL/min (ref >60)
GFR calc non Af Amer: >60 mL/min (ref >60)
GLUCOSE: 174 mg/dL — AB (ref 70–99)
Potassium: 3.5 mmol/L (ref 3.5–5.1)
Sodium: 139 mmol/L (ref 135–145)

## 2018-01-26 LAB — CBC
HCT: 32.4 % — ABNORMAL LOW (ref 39.0–52.0)
Hemoglobin: 10.8 g/dL — ABNORMAL LOW (ref 13.0–17.0)
MCH: 33.6 pg (ref 26.0–34.0)
MCHC: 33.3 g/dL (ref 30.0–36.0)
MCV: 100.9 fL — ABNORMAL HIGH (ref 78.0–100.0)
Platelets: 41 10*3/uL — ABNORMAL LOW (ref 150–400)
RBC: 3.21 MIL/uL — ABNORMAL LOW (ref 4.22–5.81)
RDW: 15.2 % (ref 11.5–15.5)
WBC: 5.3 10*3/uL (ref 4.0–10.5)

## 2018-01-26 LAB — MAGNESIUM: MAGNESIUM: 1.6 mg/dL — AB (ref 1.7–2.4)

## 2018-01-26 LAB — URINE CULTURE: Culture: <10000 — AB

## 2018-01-26 LAB — AMMONIA: Ammonia: 80 umol/L — ABNORMAL HIGH (ref 9–35)

## 2018-01-26 MED ORDER — POTASSIUM CHLORIDE CRYS ER 20 MEQ PO TBCR
40.0000 meq | EXTENDED_RELEASE_TABLET | Freq: Two times a day (BID) | ORAL | Status: AC
  Administered 2018-01-26 (×2): 40 meq via ORAL
  Filled 2018-01-26 (×2): qty 2

## 2018-01-26 MED ORDER — FUROSEMIDE 10 MG/ML IJ SOLN
40.0000 mg | Freq: Once | INTRAMUSCULAR | Status: AC
  Administered 2018-01-26: 40 mg via INTRAVENOUS
  Filled 2018-01-26: qty 4

## 2018-01-26 MED ORDER — MAGNESIUM SULFATE 2 GM/50ML IV SOLN
2.0000 g | Freq: Once | INTRAVENOUS | Status: AC
  Administered 2018-01-26: 2 g via INTRAVENOUS
  Filled 2018-01-26: qty 50

## 2018-01-26 NOTE — Progress Notes (Signed)
PROGRESS NOTE    Adam Butler  GMW:[id number] DOB: 1955-11-01 DOA: 01/24/2018 PCP: Delsa Grana, PA-C    Brief Narrative: Adam Butler is a 62 y.o. 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  Patient just been seen in the emergency department on 6 days ago after being seen in by PCP, with complaints of bilateral lower extremity swelling after patient had reportedly been out of Lasix for the last month.  Patient was given 40 mg of Lasix IV and ultimately discharged home.  Since being home patient had picked up his prescription for Lasix and have been taking as prescribed.  However, has had worsening confusion and leg edema.  Associated symptoms include urinary frequency, abdominal pain, and continued shortness of breath with exertion.    ED Course: Upon admission into the emergency department patient was seen to be afebrile, pulse 15-27, all other vital signs maintained.  Labs revealed WBC 4.4, Hbg 10.8, Plt 39, Na 132, Potassium 3, glucose 449, ammonia 190.  Urinalysis shows glucosuria, negative ketones, moderate hemoglobin, and no signs of infection.  Chest x-ray was otherwise negative for any signs of infection.  Patient was given 30 g of lactulose and a total of 50 mEq of potassium chloride.  TRH called to admit.  Assessment & Plan:   Principal Problem:   Hepatic encephalopathy (HCC) Active Problems:   Liver cirrhosis secondary to NASH (HCC)   Hypokalemia   Type 2 diabetes mellitus (HCC)  1-Acute hepatic encephalopathy;  Ammonia level decreased from 190---120. ---80 Continue with lactulose.  Per wife he is still confuse.  Bili at 3.   2-Cirrhosis liver, NASH, history of esophageal varices;  LE edema;  Resume spironolactone.  Will repeat IV lasix.  Continue with lactulose.  Discontinue statins.    Received a dose of vitamin K.  Follow LFT. Bili at 3.  LE edema improving.   DM, hyperglycemia;  SSI.    Anemia; stable. Monitor.   Hypokalemia; replete orally.   Thrombocytopenia; stable.  Chronic Discontinue aspirin.     DVT prophylaxis: scd.  Code Status: full code.  Family Communication: no family at bedside.  Disposition Plan: home when encephalopathy resolved.     Consultants:   none   Procedures: \  none   Antimicrobials:  Levaquin, chronically.    Subjective: Walking in the hall, wants to go home, confuse.   Objective: Vitals:   01/26/18 0534 01/26/18 0728 01/26/18 0905 01/26/18 1122  BP: (!) 92/34 100/80 122/73 127/60  Pulse: 78 89 89 87  Resp: 20 18 20 18   Temp: 98 F (36.7 C) 98 F (36.7 C) 98.1 F (36.7 C) 98 F (36.7 C)  TempSrc: Oral Oral Oral Oral  SpO2: 97% 100% 100% 100%  Weight: 120.2 kg (265 lb 1.6 oz)     Height:        Intake/Output Summary (Last 24 hours) at 01/26/2018 1243 Last data filed at 01/26/2018 1219 Gross per 24 hour  Intake 1256.96 ml  Output 1525 ml  Net -268.04 ml   Filed Weights   01/24/18 1917 01/25/18 0401 01/26/18 0534  Weight: 122.5 kg (270 lb) 122.1 kg (269 lb 1.6 oz) 120.2 kg (265 lb 1.6 oz)    Examination:  General exam: NAD Respiratory system: CTA Cardiovascular system: S 1, S 2 RRR, plus 1 edema Gastrointestinal system: BS present, soft, nt Central nervous  system: alert, oriented to person and place Extremities: plus 1 edema Skin: No rashes.     Data Reviewed: I have personally reviewed following labs and imaging studies  CBC: Recent Labs  Lab 01/19/18 1700 01/24/18 1915 01/25/18 0436 01/26/18 0542  WBC 4.4 4.4 5.4 5.3  HGB 11.2* 10.8* 11.0* 10.8*  HCT 31.1* 32.2* 32.8* 32.4*  MCV 96.3 100.3* 99.4 100.9*  PLT 40* 39* 37* 41*   Basic Metabolic Panel: Recent Labs  Lab 01/19/18 1700 01/24/18 1915 01/25/18 0436 01/25/18 1709 01/26/18 0542  NA 133* 132* 138 139 139  K 3.0*  3.0* 3.2* 3.4* 3.5  CL 106 101 106 108 107  CO2 23 25 27 27 24   GLUCOSE 303* 449* 248* 276* 174*  BUN 6* 6* 6* 5* 6*  CREATININE 0.63 0.79 0.67 0.76 0.73  CALCIUM 7.4* 7.9* 8.0* 8.4* 8.0*  MG  --   --   --  1.5* 1.6*   GFR: Estimated Creatinine Clearance: 118.5 mL/min (by C-G formula based on SCr of 0.73 mg/dL). Liver Function Tests: Recent Labs  Lab 01/19/18 1700 01/24/18 1915 01/25/18 0436 01/26/18 0542  AST 68* 49* 60* 60*  ALT 40 33 33 35  ALKPHOS 228* 258* 217* 157*  BILITOT 2.2* 2.4* 2.6* 3.2*  PROT 6.2* 5.9* 5.9* 5.6*  ALBUMIN 2.1* 2.0* 2.0* 1.9*   Recent Labs  Lab 01/24/18 1915  LIPASE 59*   Recent Labs  Lab 01/19/18 1700 01/24/18 2116 01/25/18 0436 01/26/18 0935  AMMONIA 111* 190* 120* 80*   Coagulation Profile: Recent Labs  Lab 01/25/18 0806  INR 1.91   Cardiac Enzymes: Recent Labs  Lab 01/19/18 1700  TROPONINI 0.03*   BNP (last 3 results) No results for input(s): PROBNP in the last 8760 hours. HbA1C: No results for input(s): HGBA1C in the last 72 hours. CBG: Recent Labs  Lab 01/25/18 2010 01/26/18 0002 01/26/18 0401 01/26/18 0827 01/26/18 1218  GLUCAP 304*  304* 165* 186* 192* 252*   Lipid Profile: No results for input(s): CHOL, HDL, LDLCALC, TRIG, CHOLHDL, LDLDIRECT in the last 72 hours. Thyroid Function Tests: No results for input(s): TSH, T4TOTAL, FREET4, T3FREE, THYROIDAB in the last 72 hours. Anemia Panel: No results for input(s): VITAMINB12, FOLATE, FERRITIN, TIBC, IRON, RETICCTPCT in the last 72 hours. Sepsis Labs: No results for input(s): PROCALCITON, LATICACIDVEN in the last 168 hours.  Recent Results (from the past 240 hour(s))  Urine culture     Status: Abnormal   Collection Time: 01/24/18  9:15 PM  Result Value Ref Range Status   Specimen Description URINE, RANDOM  Final   Special Requests NONE  Final   Culture (A)  Final    <10,000 COLONIES/mL INSIGNIFICANT GROWTH Performed at [facility], [address], [place], [place] [address]    Report Status 01/26/2018 FINAL  Final         Radiology Studies: No results found.      Scheduled Meds: . famotidine  20 mg Oral BID  . insulin aspart  0-15 Units Subcutaneous Q4H  . insulin glargine  10 Units Subcutaneous QHS  . lactulose  30 g Oral TID  . levofloxacin  500 mg Oral Daily  . multivitamin with minerals  1 tablet Oral Daily  . potassium chloride  40 mEq Oral BID  . spironolactone  25 mg Oral Daily  . cyanocobalamin  1,000 mcg Oral Daily   Continuous Infusions:   LOS: 1 day    Time spent: 35 minutes.  Adam Shiley, MD [facility] Pager [phone]  If 7PM-7AM, please contact night-coverage [email or web address] Password [facility] 01/26/2018, 12:43 PM

## 2018-01-27 LAB — BASIC METABOLIC PANEL
ANION GAP: 6 (ref 5–15)
BUN: 7 mg/dL — AB (ref 8–23)
CHLORIDE: 106 mmol/L (ref 98–111)
CO2: 25 mmol/L (ref 22–32)
Calcium: 8.2 mg/dL — ABNORMAL LOW (ref 8.9–10.3)
Creatinine, Ser: 0.8 mg/dL (ref 0.61–1.24)
GFR calc Af Amer: >60 mL/min (ref >60)
GFR calc non Af Amer: >60 mL/min (ref >60)
Glucose, Bld: 169 mg/dL — ABNORMAL HIGH (ref 70–99)
POTASSIUM: 3.6 mmol/L (ref 3.5–5.1)
SODIUM: 137 mmol/L (ref 135–145)

## 2018-01-27 LAB — AMMONIA: Ammonia: 67 umol/L — ABNORMAL HIGH (ref 9–35)

## 2018-01-27 LAB — GLUCOSE, CAPILLARY
GLUCOSE-CAPILLARY: 130 mg/dL — AB (ref 70–99)
Glucose-Capillary: 129 mg/dL — ABNORMAL HIGH (ref 70–99)
Glucose-Capillary: 175 mg/dL — ABNORMAL HIGH (ref 70–99)

## 2018-01-27 LAB — MAGNESIUM: MAGNESIUM: 1.6 mg/dL — AB (ref 1.7–2.4)

## 2018-01-27 MED ORDER — MAGNESIUM SULFATE 2 GM/50ML IV SOLN
2.0000 g | Freq: Once | INTRAVENOUS | Status: AC
  Administered 2018-01-27: 2 g via INTRAVENOUS
  Filled 2018-01-27: qty 50

## 2018-01-27 MED ORDER — INSULIN GLARGINE 100 UNIT/ML ~~LOC~~ SOLN: 10.0000 [IU] | Freq: Every day | SUBCUTANEOUS | 11 refills | Status: DC

## 2018-01-27 MED ORDER — SPIRONOLACTONE 25 MG PO TABS: 25.0000 mg | ORAL_TABLET | Freq: Every day | ORAL | 0 refills | Status: DC

## 2018-01-27 MED ORDER — LACTULOSE 10 GM/15ML PO SOLN: 30.0000 g | Freq: Three times a day (TID) | ORAL | 0 refills | Status: DC

## 2018-01-27 MED ORDER — MAGNESIUM OXIDE 400 (241.3 MG) MG PO TABS: 200.0000 mg | ORAL_TABLET | Freq: Two times a day (BID) | ORAL | 0 refills | Status: DC

## 2018-01-27 MED ORDER — FUROSEMIDE 20 MG PO TABS: 40.0000 mg | ORAL_TABLET | Freq: Every day | ORAL | 1 refills | Status: DC

## 2018-01-27 MED ORDER — "INSULIN SYRINGE 29G X 1/2"" 1 ML MISC": 1.0000 "application " | Freq: Every day | 0 refills | Status: AC

## 2018-01-27 MED ORDER — LACTULOSE 10 GM/15ML PO SOLN
30.0000 g | Freq: Three times a day (TID) | ORAL | 0 refills | Status: DC
Start: 2018-01-27 — End: 2018-02-01

## 2018-01-27 MED ORDER — MAGNESIUM OXIDE 400 (241.3 MG) MG PO TABS
200.0000 mg | ORAL_TABLET | Freq: Two times a day (BID) | ORAL | Status: DC
  Administered 2018-01-27: 200 mg via ORAL
  Filled 2018-01-27: qty 1

## 2018-01-27 NOTE — Discharge Summary (Signed)
Physician Discharge Summary  Adam Butler TGG:[id number] DOB: [birth year] DOA: 01/24/2018  PCP: Delsa Grana, PA-C  Admit date: 01/24/2018 Discharge date: 01/27/2018  Admitted From: Home  Disposition:  Home   Recommendations for Outpatient Follow-up:  1. Follow up with PCP in 1-2 weeks 2. Please obtain BMP/CBC in one week 3. Needs repeat LFT and ammonia level.     Discharge Condition: stable.  CODE STATUS: full code.  Diet recommendation: Heart Healthy   Brief/Interim Summary: Brief Narrative: Adam Butler a 62 y.o.malewith medical history significant ofHTN, HLD, DM type II,cryptogenic(suspected NASH)cirrhosis with esophageal varices, history of hepatic encephalopathy;who presents withcomplaints of lower extremity swelling. Patient just been seen in the emergency department on 6 days ago after being seen in by PCP, with complaints of bilateral lower extremity swelling after patient had reportedly been out of Lasix for the last month. Patient was given 40 mg of Lasix IV and ultimately discharged home. Since being home patient had picked up his prescription for Lasix and have been taking as prescribed. However, has had worsening confusion and leg edema. Associated symptoms include urinary frequency, abdominal pain, and continued shortness of breath with exertion.   ED Course:Upon admission into the emergency department patient was seen to be afebrile, pulse 15-27, all other vital signs maintained. Labs revealed WBC 4.4, Hbg 10.8, Plt 39, Na 132, Potassium 3, glucose 449, ammonia 190.Urinalysis shows glucosuria, negative ketones, moderate hemoglobin, and no signs of infection. Chest x-ray was otherwise negative for any signs of infection.Patient was given 30 g of lactulose and a total of 50 mEq of potassium chloride. TRH called to admit.  Assessment & Plan:   Principal Problem:   Hepatic encephalopathy (HCC) Active Problems:   Liver cirrhosis secondary to  NASH (HCC)   Hypokalemia   Type 2 diabetes mellitus (HCC)  1-Acute hepatic encephalopathy;  Ammonia level decreased from 190---120. ---80--60 Continue with lactulose.  Improved. Ok to discharge  Bili at 3.   2-Cirrhosis liver, NASH, history of esophageal varices;  LE edema;  Resume spironolactone.  Resume home dose of lasix.  Continue with lactulose.  Discontinue statins.  Received a dose of vitamin K.  Follow LFT. Bili at 3.  LE edema improving.   DM, hyperglycemia;  SSI.  Started on lantus , low dose.    Anemia; stable. Monitor.   Hypokalemia; replete orally.  replete mg IV and provided oral supplementation.   Thrombocytopenia; stable.  Chronic Discontinue aspirin.       Discharge Diagnoses:  Principal Problem:   Hepatic encephalopathy ([place]) Active Problems:   Liver cirrhosis secondary to NASH (HCC)   Hypokalemia   Type 2 diabetes mellitus [facility])    Discharge Instructions  Discharge Instructions    Diet - low sodium heart healthy   Complete by:  As directed    Increase activity slowly   Complete by:  As directed      Allergies as of 01/27/2018      Reactions   Isosorbide Nitrate Other (See Comments)   Nose bleeds      Medication List    STOP taking these medications   aspirin EC 81 MG tablet   atorvastatin 10 MG tablet Commonly known as:  LIPITOR     TAKE these medications   cyanocobalamin 1000 MCG tablet Take 1,000 mcg by mouth daily. Vitamin B12   fluticasone 50 MCG/ACT nasal spray Commonly known as:  FLONASE Place 2 sprays into both nostrils daily as needed for allergies or rhinitis.   furosemide  20 MG tablet Commonly known as:  LASIX Take 2 tablets (40 mg total) by mouth daily. What changed:  how much to take   insulin glargine 100 UNIT/ML injection Commonly known as:  LANTUS Inject 0.1 mLs (10 Units total) into the skin at bedtime.   insulin regular 100 units/mL injection Commonly known as:  NOVOLIN R,HUMULIN  R Inject 2-8 Units into the skin 3 (three) times daily as needed for high blood sugar (CBG >150).   INSULIN SYRINGE 1CC/29G 29G X 1/2" 1 ML Misc Commonly known as:  SAFETY INSULIN SYRINGES 1 application by Does not apply route daily.   lactulose 10 GM/15ML solution Commonly known as:  CHRONULAC Take 45 mLs (30 g total) by mouth 3 (three) times daily. What changed:  See the new instructions.   levofloxacin 500 MG tablet Commonly known as:  LEVAQUIN Take 500 mg by mouth daily.   magnesium oxide 400 (241.3 Mg) MG tablet Commonly known as:  MAG-OX Take 0.5 tablets (200 mg total) by mouth 2 (two) times daily.   multivitamin with minerals Tabs tablet Take 1 tablet by mouth daily.   nitroGLYCERIN 0.4 MG SL tablet Commonly known as:  NITROSTAT Place 1 tablet (0.4 mg total) under the tongue every 5 (five) minutes as needed for chest pain (chest pain).   Potassium 99 MG Tabs Take 99 mg by mouth daily.   ranitidine 150 MG tablet Commonly known as:  ZANTAC Take 150 mg by mouth 2 (two) times daily as needed for heartburn.   spironolactone 25 MG tablet Commonly known as:  ALDACTONE Take 1 tablet (25 mg total) by mouth daily.      Follow-up Information    Delsa Grana, PA-C Follow up in 3 day(s).   Specialty:  Family Medicine Contact information: [address] [place] [address] [phone]          Allergies  Allergen Reactions  . Isosorbide Nitrate Other (See Comments)    Nose bleeds    Consultations:  none   Procedures/Studies: Dg Chest 2 View  Result Date: 01/19/2018 CLINICAL DATA:  Shortness of breath and bilateral leg swelling. EXAM: CHEST - 2 VIEW COMPARISON:  November 02, 2017 FINDINGS: The heart, hila, and mediastinum are normal. No pulmonary nodules or masses. No focal infiltrates. No overt edema. IMPRESSION: No active cardiopulmonary disease. Electronically Signed   By: Dorise Bullion III M.D   On: 01/19/2018 17:19     Subjective: Feeling well,  alert and oriented times 3.   Discharge Exam: Vitals:   01/27/18 0000 01/27/18 0400  BP: 132/66 (!) 109/54  Pulse: 75 82  Resp: 20 18  Temp: 98.4 F (36.9 C) 97.7 F (36.5 C)  SpO2:     Vitals:   01/26/18 2000 01/27/18 0000 01/27/18 0044 01/27/18 0400  BP: (!) 125/59 132/66  (!) 109/54  Pulse: 83 75  82  Resp: 20 20  18   Temp: (!) 97.3 F (36.3 C) 98.4 F (36.9 C)  97.7 F (36.5 C)  TempSrc: Oral Oral  Oral  SpO2:      Weight:   121.1 kg (266 lb 14.4 oz)   Height:        General: Pt is alert, awake, not in acute distress Cardiovascular: RRR, S1/S2 +, no rubs, no gallops Respiratory: CTA bilaterally, no wheezing, no rhonchi Abdominal: Soft, NT, ND, bowel sounds + Extremities: no edema, no cyanosis    The results of significant diagnostics from this hospitalization (including imaging, microbiology, ancillary and  laboratory) are listed below for reference.     Microbiology: Recent Results (from the past 240 hour(s))  Urine culture     Status: Abnormal   Collection Time: 01/24/18  9:15 PM  Result Value Ref Range Status   Specimen Description URINE, RANDOM  Final   Special Requests NONE  Final   Culture (A)  Final    <10,000 COLONIES/mL INSIGNIFICANT GROWTH Performed at [facility], [address], [place], [place] [address]    Report Status 01/26/2018 FINAL  Final     Labs: BNP (last 3 results) Recent Labs    03/02/17 1009 01/19/18 1700  BNP 54.0 47.8   Basic Metabolic Panel: Recent Labs  Lab 01/24/18 1915 01/25/18 0436 01/25/18 1709 01/26/18 0542 01/27/18 0612  NA 132* 138 139 139 137  K 3.0* 3.2* 3.4* 3.5 3.6  CL 101 106 108 107 106  CO2 25 27 27 24 25   GLUCOSE 449* 248* 276* 174* 169*  BUN 6* 6* 5* 6* 7*  CREATININE 0.79 0.67 0.76 0.73 0.80  CALCIUM 7.9* 8.0* 8.4* 8.0* 8.2*  MG  --   --  1.5* 1.6* 1.6*   Liver Function Tests: Recent Labs  Lab 01/24/18 1915 01/25/18 0436 01/26/18 0542  AST 49* 60* 60*  ALT 33 33 35  ALKPHOS  258* 217* 157*  BILITOT 2.4* 2.6* 3.2*  PROT 5.9* 5.9* 5.6*  ALBUMIN 2.0* 2.0* 1.9*   Recent Labs  Lab 01/24/18 1915  LIPASE 59*   Recent Labs  Lab 01/24/18 2116 01/25/18 0436 01/26/18 0935 01/27/18 0612  AMMONIA 190* 120* 80* 67*   CBC: Recent Labs  Lab 01/24/18 1915 01/25/18 0436 01/26/18 0542  WBC 4.4 5.4 5.3  HGB 10.8* 11.0* 10.8*  HCT 32.2* 32.8* 32.4*  MCV 100.3* 99.4 100.9*  PLT 39* 37* 41*   Cardiac Enzymes: No results for input(s): CKTOTAL, CKMB, CKMBINDEX, TROPONINI in the last 168 hours. BNP: Invalid input(s): POCBNP CBG: Recent Labs  Lab 01/26/18 1632 01/26/18 2027 01/27/18 0034 01/27/18 0329 01/27/18 0743  GLUCAP 263* 193* 130* 129* 175*   D-Dimer No results for input(s): DDIMER in the last 72 hours. Hgb A1c No results for input(s): HGBA1C in the last 72 hours. Lipid Profile No results for input(s): CHOL, HDL, LDLCALC, TRIG, CHOLHDL, LDLDIRECT in the last 72 hours. Thyroid function studies No results for input(s): TSH, T4TOTAL, T3FREE, THYROIDAB in the last 72 hours.  Invalid input(s): FREET3 Anemia work up No results for input(s): VITAMINB12, FOLATE, FERRITIN, TIBC, IRON, RETICCTPCT in the last 72 hours. Urinalysis    Component Value Date/Time   COLORURINE YELLOW 01/24/2018 2116   APPEARANCEUR CLEAR 01/24/2018 2116   LABSPEC 1.021 01/24/2018 2116   PHURINE 7.0 01/24/2018 2116   GLUCOSEU >=500 (A) 01/24/2018 2116   HGBUR MODERATE (A) 01/24/2018 2116   BILIRUBINUR NEGATIVE 01/24/2018 2116   KETONESUR NEGATIVE 01/24/2018 2116   PROTEINUR NEGATIVE 01/24/2018 2116   UROBILINOGEN 0.2 05/14/2015 1813   NITRITE NEGATIVE 01/24/2018 2116   LEUKOCYTESUR NEGATIVE 01/24/2018 2116   Sepsis Labs Invalid input(s): PROCALCITONIN,  WBC,  LACTICIDVEN Microbiology Recent Results (from the past 240 hour(s))  Urine culture     Status: Abnormal   Collection Time: 01/24/18  9:15 PM  Result Value Ref Range Status   Specimen Description URINE, RANDOM   Final   Special Requests NONE  Final   Culture (A)  Final    <10,000 COLONIES/mL INSIGNIFICANT GROWTH Performed at [facility], [address] [place],  [place] [address]    Report Status 01/26/2018 FINAL  Final     Time coordinating discharge: 35 minutes.   SIGNED:   Elmarie Shiley, MD  [facility] 01/27/2018, 3:02 PM Pager   If 7PM-7AM, please contact night-coverage [email or web address] Password [email or web address]

## 2018-01-29 ENCOUNTER — Other Ambulatory Visit: Payer: Self-pay

## 2018-01-29 MED FILL — levoFLOXacin 500 MG TABS: 500 | 30 days supply | Qty: 30 | Fill #6

## 2018-01-30 ENCOUNTER — Telehealth: Payer: Self-pay

## 2018-01-30 ENCOUNTER — Other Ambulatory Visit: Payer: Self-pay

## 2018-01-30 DIAGNOSIS — K7469 Other cirrhosis of liver: Secondary | ICD-10-CM

## 2018-01-30 NOTE — Telephone Encounter (Signed)
Left message for pt to call back  °

## 2018-01-30 NOTE — Telephone Encounter (Signed)
-----   Message from Algernon Huxley, RN sent at 08/02/2017  4:33 PM EST ----- Regarding: [place] Needs [place] in 6 mth  [place] screening

## 2018-01-30 NOTE — Telephone Encounter (Signed)
-----   Message from Algernon Huxley, RN sent at 08/02/2017  4:33 PM EST ----- Regarding: [place] Needs [place] in 6 mth  [place] screening

## 2018-01-30 NOTE — Telephone Encounter (Signed)
-----   Message from Algernon Huxley, RN sent at 08/02/2017  4:33 PM EST ----- Regarding: [place] Needs [place] in 6 mth  [place] screening

## 2018-01-30 NOTE — Patient Outreach (Signed)
[facility]) Care Management  01/30/2018  Adam Butler Dec 07, 1955 [id number]  62 year old male outreached by  [facility] services for 30 day post discharge medication review.  PMHx includes, but not limited to, esophageal varices, chronic diastolic heart failure, liver cirrhosis, GERD and Type 2 diabetes mellitus.  Unsuccessful outreach attempt #1 to [name].  Left HIPAA compliant voice message requesting a return call.   Plan: Outreach attempt #2 in 3-4 business days.    Joetta Manners, PharmD Clinical Pharmacist [place] [phone]

## 2018-01-31 ENCOUNTER — Ambulatory Visit (INDEPENDENT_AMBULATORY_CARE_PROVIDER_SITE_OTHER): Payer: PPO | Admitting: Family Medicine

## 2018-01-31 ENCOUNTER — Encounter: Payer: Self-pay | Admitting: Family Medicine

## 2018-01-31 VITALS — BP 114/66 | HR 88 | Temp 97.7°F | Resp 17 | Wt 262.0 lb

## 2018-01-31 DIAGNOSIS — K7581 Nonalcoholic steatohepatitis (NASH): Secondary | ICD-10-CM | POA: Diagnosis not present

## 2018-01-31 DIAGNOSIS — K7290 Hepatic failure, unspecified without coma: Secondary | ICD-10-CM | POA: Diagnosis not present

## 2018-01-31 DIAGNOSIS — R600 Localized edema: Secondary | ICD-10-CM | POA: Diagnosis not present

## 2018-01-31 DIAGNOSIS — K7460 Unspecified cirrhosis of liver: Secondary | ICD-10-CM | POA: Diagnosis not present

## 2018-01-31 DIAGNOSIS — E876 Hypokalemia: Secondary | ICD-10-CM

## 2018-01-31 DIAGNOSIS — K7682 Hepatic encephalopathy: Secondary | ICD-10-CM

## 2018-01-31 DIAGNOSIS — Z09 Encounter for follow-up examination after completed treatment for conditions other than malignant neoplasm: Secondary | ICD-10-CM

## 2018-01-31 MED ORDER — FUROSEMIDE 20 MG PO TABS: 40.0000 mg | ORAL_TABLET | Freq: Every day | ORAL | 3 refills | Status: DC

## 2018-01-31 NOTE — Progress Notes (Signed)
Patient ID: Adam Butler, male    DOB: 04/22/1956, 62 y.o.   MRN: [id number]  PCP: Delsa Grana, PA-C  Chief Complaint  Patient presents with  . Hospitalization Follow-up    Subjective:   Adam Butler is a 62 y.o. male, presents to clinic with CC of hospital followup after admission 01/25/18-01/27/18 for hepatic encephalopathy, cirrhosis secondary to NASH, IDDM with hyperglycemia and hypokalemia.  Pt was fluid overloaded ~ 30 lbs at 270 lbs (lowest recent weight near my first visit with this pt was 240 lbs), altered and tachypneic.  He has his paperwork with him with several medication changes that he has not been compliant with.   Lactulose 30 mg TID, pt states he is taking 10 mg lactulose once nightly.  Pt states that works better for his BM's and it is allowing him to get some sleep and he is less sleepy throughout the daytime.  His insulin was also changed.  His was instructed to do lantus 10 units at night and Novolin R sliding scale TID. He does not want to take lantus, saying it was too expensive in the past, and he has been doing novolin R for years and it is affordable.  He reports morning sugars 122-124 in the am.  A recent low was 80 didn't feel well, sweaty and hungry, 180 highest after eating more at night.   He was discharged from the hospital with decreased spironolactone and increased lasix dose.  He is breathing good, is walking a lot, feeling better.  He was "feeling drunk" and sleeping throughout the day and now he is sleeping better at night and awake during the day more.  But he does not feel like he's not getting enough fluid off his legs despite taking the medicines.  He still has severely swollen feet, ankles and legs, and his abdomen does also feel enlarged with swelling in abdominal skin as well.  He wants to wear compression stockings but is having difficulty getting them on.    GI/Dr. Hilarie Fredrickson - pt states he saw GI while in the hospital, he has spoken to their  office, appointment pending and f/up LUQ [place] pending.  Pt presents by himself today, wife not present.   Patient Active Problem List   Diagnosis Date Noted  . History of colonic polyps   . Benign neoplasm of ascending colon   . Noninfectious gastroenteritis   . Chronic diastolic CHF (congestive heart failure) ([place])   . Thoracic ascending aortic aneurysm ([place])   . OA (osteoarthritis) of knee 07/19/2016  . Hepatic encephalopathy ([place]) 12/16/2015  . GERD (gastroesophageal reflux disease) 09/29/2015  . Type 2 diabetes mellitus ([place]) 08/18/2015  . CAD in native artery   . Hypokalemia   . Liver cirrhosis secondary to NASH ([place]) 06/06/2015  . Tobacco abuse 05/04/2015  . PFO (patent foramen ovale): Per TEE 04/20/2015 04/20/2015  . Esophageal varices without bleeding ([place]) 08/16/2010     Prior to Admission medications   Medication Sig Start Date End Date Taking? Authorizing Provider  cyanocobalamin 1000 MCG tablet Take 1,000 mcg by mouth daily. Vitamin B12   Yes [provider]  fluticasone (FLONASE) 50 MCG/ACT nasal spray Place 2 sprays into both nostrils daily as needed for allergies or rhinitis.    Yes [provider]  furosemide (LASIX) 20 MG tablet Take 2 tablets (40 mg total) by mouth daily. 01/27/18  Yes Regalado, Belkys A, MD  insulin glargine (LANTUS) 100 UNIT/ML injection Inject 0.1 mLs (  10 Units total) into the skin at bedtime. 01/27/18  Yes Regalado, Belkys A, MD  insulin regular (NOVOLIN R,HUMULIN R) 100 units/mL injection Inject 2-8 Units into the skin 3 (three) times daily as needed for high blood sugar (CBG >150).    Yes [provider]  INSULIN SYRINGE 1CC/29G (SAFETY INSULIN SYRINGES) 29G X 1/2" 1 ML MISC 1 application by Does not apply route daily. 01/27/18  Yes Regalado, Belkys A, MD  lactulose (CHRONULAC) 10 GM/15ML solution Take 45 mLs (30 g total) by mouth 3 (three) times daily. 01/27/18  Yes Regalado, Belkys A, MD  levofloxacin (LEVAQUIN) 500 MG  tablet Take 500 mg by mouth daily.  10/24/17  Yes [provider]  magnesium oxide (MAG-OX) 400 (241.3 Mg) MG tablet Take 0.5 tablets (200 mg total) by mouth 2 (two) times daily. 01/27/18  Yes Regalado, Belkys A, MD  Multiple Vitamin (MULTIVITAMIN WITH MINERALS) TABS tablet Take 1 tablet by mouth daily.   Yes [provider]  Potassium 99 MG TABS Take 99 mg by mouth daily.   Yes [provider]  ranitidine (ZANTAC) 150 MG tablet Take 150 mg by mouth 2 (two) times daily as needed for heartburn.    Yes [provider]  spironolactone (ALDACTONE) 25 MG tablet Take 1 tablet (25 mg total) by mouth daily. 01/27/18  Yes Regalado, Belkys A, MD  nitroGLYCERIN (NITROSTAT) 0.4 MG SL tablet Place 1 tablet (0.4 mg total) under the tongue every 5 (five) minutes as needed for chest pain (chest pain). Patient not taking: Reported on 01/31/2018 07/19/16   [name], NP     Allergies  Allergen Reactions  . Isosorbide Nitrate Other (See Comments)    Nose bleeds     Family History  Problem Relation Age of Onset  . Heart attack Brother 59       MI  . Diabetes Brother   . Heart attack Father   . Diabetes Father   . COPD Father   . COPD Mother   . Heart attack Brother   . Stroke Neg Hx      Social History   Socioeconomic History  . Marital status: Married    Spouse name: Not on file  . Number of children: 3  . Years of education: Not on file  . Highest education level: Not on file  Occupational History  . Occupation: Merchandiser, retail: OTHER    Comment: Worked in Theatre manager prior  Social Needs  . Financial resource strain: Not on file  . Food insecurity:    Worry: Not on file    Inability: Not on file  . Transportation needs:    Medical: Not on file    Non-medical: Not on file  Tobacco Use  . Smoking status: Current Every Day Smoker    Packs/day: 0.25    Years: 36.00    Pack years: 9.00    Types: E-cigarettes, Cigarettes  . Smokeless  tobacco: Never Used  . Tobacco comment: uses vapor cigarettes (2016 ); I smoke about 4 cigarettes a day  Substance and Sexual Activity  . Alcohol use: No    Alcohol/week: 0.0 oz  . Drug use: No  . Sexual activity: Never  Lifestyle  . Physical activity:    Days per week: Not on file    Minutes per session: Not on file  . Stress: Not on file  Relationships  . Social connections:    Talks on phone: Not on file  Gets together: Not on file    Attends religious service: Not on file    Active member of club or organization: Not on file    Attends meetings of clubs or organizations: Not on file    Relationship status: Not on file  . Intimate partner violence:    Fear of current or ex partner: Not on file    Emotionally abused: Not on file    Physically abused: Not on file    Forced sexual activity: Not on file  Other Topics Concern  . Not on file  Social History Narrative   Married   Gets regular exercise: walking     Review of Systems  Constitutional: Negative.  Negative for activity change, appetite change, chills, diaphoresis, fatigue and fever.  HENT: Negative.   Respiratory: Negative for cough and chest tightness.   Cardiovascular: Positive for leg swelling. Negative for chest pain and palpitations.  Gastrointestinal: Positive for diarrhea. Negative for abdominal pain, nausea and vomiting.  Genitourinary: Negative.   Musculoskeletal: Negative.   Skin: Negative.   Neurological: Negative.  Negative for syncope.  Psychiatric/Behavioral: Negative.  Negative for confusion.  All other systems reviewed and are negative.      Objective:    Vitals:   01/31/18 0814  BP: 114/66  Pulse: 88  Resp: 17  Temp: 97.7 F (36.5 C)  TempSrc: Oral  SpO2: 98%  Weight: 262 lb (118.8 kg)      Physical Exam  Constitutional: He is oriented to person, place, and time. Vital signs are normal. He appears well-developed. He has a sickly appearance. He appears ill. No distress.    Chronically ill appearing male, obese, alert & O x 3, NAD  HENT:  Head: Normocephalic and atraumatic.  Right Ear: Tympanic membrane, external ear and ear canal normal.  Left Ear: Tympanic membrane, external ear and ear canal normal.  Nose: Nose normal. No mucosal edema or rhinorrhea. Right sinus exhibits no maxillary sinus tenderness and no frontal sinus tenderness. Left sinus exhibits no maxillary sinus tenderness and no frontal sinus tenderness.  Mouth/Throat: Uvula is midline. No trismus in the jaw. No uvula swelling. No oropharyngeal exudate, posterior oropharyngeal edema or posterior oropharyngeal erythema.  Eyes: Pupils are equal, round, and reactive to light. Conjunctivae, EOM and lids are normal. No scleral icterus.  Neck: Trachea normal, normal range of motion and phonation normal. No tracheal deviation present.  Cardiovascular: Normal rate, regular rhythm and normal pulses. Exam reveals no gallop and no friction rub.  Murmur heard. Pulses:      Radial pulses are 2+ on the right side, and 2+ on the left side.  severe b/l pretibial pitting edema  Pulmonary/Chest: Effort normal. No accessory muscle usage or stridor. No tachypnea. No respiratory distress. He has decreased breath sounds in the right lower field and the left lower field. He has no wheezes. He has no rhonchi. He has no rales.  Abdominal: Soft. Normal appearance and bowel sounds are normal. He exhibits no mass. There is no tenderness. There is no rebound and no guarding.  Enlarged abdomen, non-distended, abdominal skin edematous and firm up to level of umbilicus  Musculoskeletal: Normal range of motion. He exhibits no edema.  Neurological: He is alert and oriented to person, place, and time. He displays no tremor. He exhibits normal muscle tone. He displays no seizure activity.  Walks with cane  Skin: Skin is warm, dry and intact. Capillary refill takes less than 2 seconds. No rash noted. He is not diaphoretic.  No cyanosis.   Slightly ashy coloring, no jaundice  Psychiatric: His speech is normal and behavior is normal. Thought content normal. His affect is not inappropriate. He is not slowed. Cognition and memory are normal. He does not express inappropriate judgment. He expresses no homicidal and no suicidal ideation. He expresses no suicidal plans and no homicidal plans.  Nursing note and vitals reviewed.         Assessment & Plan:      ICD-10-CM   1. Liver cirrhosis secondary to NASH (HCC) Y30.16 COMPLETE METABOLIC PANEL WITH GFR   K74.60 CBC with Differential    Ammonia    furosemide (LASIX) 20 MG tablet    spironolactone (ALDACTONE) 25 MG tablet   Continue lactulose 30 mg TID, restart spironolactone & lasix in 100:40 ratio, return to 50 mg and 20 mg, con't potassium and mag replacement, repeat CMP in 2 wk  2. Bilateral lower extremity edema W10.9 COMPLETE METABOLIC PANEL WITH GFR    CBC with Differential   spironolactone and lasix, try weights and BP.  To contact office if not tolerating meds (ie: orthostatic/near syncope)  3. Hypomagnesemia E83.42 Magnesium   continue supplement  4. Hepatic encephalopathy (HCC) K72.90 lactulose (CHRONULAC) 10 GM/15ML solution   same as above - repeat ammonia  2 weeks, call wife to have watch for recurrent encephalopathy, pt may need help with med/tx compliance  5. Hypokalemia E87.6    spironolactone will improve, recheck in 2 weeks   6. Encounter for examination following treatment at hospital Z09     Pt needs to f/up with GI as well. Pt's VSS, no distress. He look improved from last visit on 01/19/18, mentation back to what I know to be his baseline from initial visit.  He appears hypervolemic with continued pitting edema to b/l LE and increased weight, needs dose adjustment of diuretics, no improvement with increased lasix dose, will return to past meds dosing (noted above), low sodium diet, watch for hypotension. F/up and repeat labs in 2 weeks. Will consult Dr.  Dennard Schaumann for complicated pt.  Delsa Grana, PA-C 01/31/18 8:36 AM

## 2018-01-31 NOTE — Patient Instructions (Signed)
Continue your lactulose as prescribed  Take two lasix pills in the AM and use the compression stockings.  We need to get another 20+ lbs off your body, and you need to take the higher dose meds for a while to make this happen.  Get your appointment with Dr. Hilarie Fredrickson  Your weight today is 262 lbs, your "DRY" weight was lower around 240, so weight yourself daily and come in to be rechecked if your weight goes up.    If you get short of breath, sleepy, confused you need to go back to the ER.

## 2018-01-31 NOTE — Telephone Encounter (Signed)
Pt scheduled for [place] of abd at [facility] 02/06/18@8am , pt to arrive there at 7:45am and be NPO after midnight. Pt aware.

## 2018-02-01 LAB — CBC WITH DIFFERENTIAL/PLATELET
BASOS PCT: 1 %
Basophils Absolute: 49 cells/uL (ref 0–200)
EOS ABS: 431 {cells}/uL (ref 15–500)
Eosinophils Relative: 8.8 %
HEMATOCRIT: 31.3 % — AB (ref 38.5–50.0)
Hemoglobin: 10.7 g/dL — ABNORMAL LOW (ref 13.2–17.1)
Lymphs Abs: 833 cells/uL — ABNORMAL LOW (ref 850–3900)
MCH: 34.2 pg — AB (ref 27.0–33.0)
MCHC: 34.2 g/dL (ref 32.0–36.0)
MCV: 100 fL (ref 80.0–100.0)
MONOS PCT: 13.6 %
MPV: 12 fL (ref 7.5–12.5)
Neutro Abs: 2920 cells/uL (ref 1500–7800)
Neutrophils Relative %: 59.6 %
Platelets: 42 10*3/uL — ABNORMAL LOW (ref 140–400)
RBC: 3.13 10*6/uL — ABNORMAL LOW (ref 4.20–5.80)
RDW: 13.6 % (ref 11.0–15.0)
TOTAL LYMPHOCYTE: 17 %
WBC: 4.9 10*3/uL (ref 3.8–10.8)
WBCMIX: 666 {cells}/uL (ref 200–950)

## 2018-02-01 LAB — AMMONIA: Ammonia: 101 umol/L — ABNORMAL HIGH (ref <=72)

## 2018-02-01 LAB — MAGNESIUM: MAGNESIUM: 1.4 mg/dL — AB (ref 1.5–2.5)

## 2018-02-01 LAB — COMPLETE METABOLIC PANEL WITH GFR
AG RATIO: 0.7 (calc) — AB (ref 1.0–2.5)
ALBUMIN MSPROF: 2.4 g/dL — AB (ref 3.6–5.1)
ALKALINE PHOSPHATASE (APISO): 191 U/L — AB (ref 40–115)
ALT: 54 U/L — ABNORMAL HIGH (ref 9–46)
AST: 101 U/L — ABNORMAL HIGH (ref 10–35)
BUN: 10 mg/dL (ref 7–25)
CO2: 24 mmol/L (ref 20–32)
CREATININE: 0.86 mg/dL (ref 0.70–1.25)
Calcium: 7.5 mg/dL — ABNORMAL LOW (ref 8.6–10.3)
Chloride: 104 mmol/L (ref 98–110)
GFR, Est African American: 108 mL/min/{1.73_m2} (ref >=60)
GFR, Est Non African American: 94 mL/min/{1.73_m2} (ref >=60)
GLOBULIN: 3.6 g/dL (ref 1.9–3.7)
Glucose, Bld: 188 mg/dL — ABNORMAL HIGH (ref 65–99)
Potassium: 3.4 mmol/L — ABNORMAL LOW (ref 3.5–5.3)
SODIUM: 135 mmol/L (ref 135–146)
Total Bilirubin: 3.4 mg/dL — ABNORMAL HIGH (ref 0.2–1.2)
Total Protein: 6 g/dL — ABNORMAL LOW (ref 6.1–8.1)

## 2018-02-01 MED ORDER — SPIRONOLACTONE 25 MG PO TABS: 50.0000 mg | ORAL_TABLET | Freq: Every day | ORAL | 1 refills | Status: DC

## 2018-02-01 MED ORDER — FUROSEMIDE 20 MG PO TABS: 20.0000 mg | ORAL_TABLET | Freq: Every day | ORAL | 1 refills | Status: DC

## 2018-02-01 MED ORDER — LACTULOSE 10 GM/15ML PO SOLN: 30.0000 g | Freq: Three times a day (TID) | ORAL | 3 refills | Status: DC

## 2018-02-01 NOTE — Progress Notes (Signed)
Please give lab results to pt or his wife  his platelets, anemia are stable.  Kidney function is good. His liver function looks slightly worse than his most recent labs and ammonia is higher than when he was discharged.  He needs to do the dosing of medications given to him by the hospital for lactulose.  They prescribed him 30 mg (45 mLs)  lactulose TID and he states he is only taking 15 mLs once a day.  He needs to do the higher doses until he sees Dr. Hilarie Fredrickson to readjust the med dosing.   Potassium and magnesium are both low so he needs to continue to supplement. If his weight is not decreasing in the next week he needs to come back in for quick weight check and meet again to reiterate medication changes.  He does need GI follow up appointment for recheck after hospital changed meds that Dr. Hilarie Fredrickson was managing.  I know he has an ultrasound scheduled, but I need GI to see him as well.

## 2018-02-02 ENCOUNTER — Other Ambulatory Visit: Payer: Self-pay | Admitting: Family Medicine

## 2018-02-02 MED ORDER — MAGNESIUM OXIDE 400 (241.3 MG) MG PO TABS: 200.0000 mg | ORAL_TABLET | Freq: Two times a day (BID) | ORAL | 0 refills | Status: DC

## 2018-02-02 NOTE — Patient Outreach (Signed)
[facility]) Care Management  02/02/2018  Adam Butler 06-11-1956 [id number]  62 year old male outreached by  [place] services for 30 day post discharge medication review.  PMHx includes, but not limited to, esophageal varices, chronic diastolic heart failure, liver cirrhosis, GERD and Type 2 diabetes mellitus.  Unsuccessful outreach attempt # 2 to [name].  Left HIPAA compliant voice message requesting a return call.   Plan:  Outreach attempt # 3.  Joetta Manners, PharmD Clinical Pharmacist [place] [phone]

## 2018-02-02 NOTE — Progress Notes (Signed)
Have called the pt's home and pt's wife to f/up with Pt re med compliance. Pt demographics Adam Butler has access to pt PHI. Did discuss with her the medication changes when pt was discharged, and then changes in clinic for f/up visit.  Wife does help administer his meds.  She will put current doses in pill box for pt to take.  She states he is not taking lactulose as directed and is taking lower doses and sometimes normal dose 15-45 mLs once per day.  He is wearing compression stockings.  His edema does not look improved yet.  Delsa Grana, PA-C 02/02/18 1:42 PM

## 2018-02-05 ENCOUNTER — Other Ambulatory Visit: Payer: Self-pay

## 2018-02-05 NOTE — Patient Outreach (Addendum)
[facility]) Care Management  02/05/2018  Adam Butler November 04, 1955 [id number]  62year old male [place] services for 30 day post discharge medication review.PMHx includes, but not limited to, esophageal varices, chronic diastolic heart failure, liver cirrhosis, GERD and Type 2 diabetes mellitus.  Successful outreach attempt # 3 to [name].  Patient states the he is does not have time to talk to me and that he will call me back when he has more time.  Plan: Make no further outreach attempts to complete a post discharge medication review.   Joetta Manners, PharmD Clinical Pharmacist [place] [phone]

## 2018-02-06 ENCOUNTER — Ambulatory Visit (HOSPITAL_COMMUNITY)
Admission: RE | Admit: 2018-02-06 | Discharge: 2018-02-06 | Disposition: A | Discharge status: Home / Self Care | Payer: PPO | Source: Ambulatory Visit | Attending: Internal Medicine | Admitting: Internal Medicine

## 2018-02-06 DIAGNOSIS — R161 Splenomegaly, not elsewhere classified: Secondary | ICD-10-CM | POA: Insufficient documentation

## 2018-02-06 DIAGNOSIS — K8020 Calculus of gallbladder without cholecystitis without obstruction: Secondary | ICD-10-CM | POA: Insufficient documentation

## 2018-02-06 DIAGNOSIS — K7469 Other cirrhosis of liver: Secondary | ICD-10-CM | POA: Diagnosis not present

## 2018-02-06 DIAGNOSIS — R188 Other ascites: Secondary | ICD-10-CM | POA: Insufficient documentation

## 2018-02-06 DIAGNOSIS — K7460 Unspecified cirrhosis of liver: Secondary | ICD-10-CM | POA: Diagnosis not present

## 2018-02-12 ENCOUNTER — Telehealth: Payer: Self-pay | Admitting: Family Medicine

## 2018-02-12 NOTE — Telephone Encounter (Signed)
CB# [id number]  Wife called wants to know if he needs to start back on his aspirin, and atorvastatin 10 mg she states that they took him off of this when he was seen in the hospital. She can't remember what you had told them previously when you discussed this medication.  Needs a refill on his spironolactone 50 mg she states they are out of this medication since he was told to double the 25 mg. I have advised her to contact pharmacy since I see that Leisa sent in on 02/01/18 she is going to contact pharmacy to see if they have the prescription and if not they will send [place] a request for a new prescription.  Wife states he is still swelling in his feet. She would like Leisa to call her to discuss.

## 2018-02-13 ENCOUNTER — Other Ambulatory Visit: Payer: Self-pay | Admitting: Family Medicine

## 2018-02-13 DIAGNOSIS — K7460 Unspecified cirrhosis of liver: Secondary | ICD-10-CM

## 2018-02-13 DIAGNOSIS — K7581 Nonalcoholic steatohepatitis (NASH): Principal | ICD-10-CM

## 2018-02-13 NOTE — Progress Notes (Signed)
Pt needs recheck CMP, CBC, mag, ammonia following hospitalization, clinic f/up with med changes.  Okay'd by Dr. Dennard Schaumann, to be put on Dr. Samella Parr schedule.    Reiterated to pt need to f/up with clinic visit with GI. He has gone to GI recently for colonoscopy and RUQ [place], but he does need clinic f/up appt.

## 2018-02-13 NOTE — Telephone Encounter (Signed)
Pt should have 30 day supply - sent in on 02/01/18.  I did not want to prescribe beyond that because we have to watch his tolerance, electrolytes and fluid status carefully when using the multiple diuretics.  I thought Adam Butler had a close f/up appt 2 weeks to recheck fluid status, electrolytes, etc. From our last visit.  But I do not see an appt until the end of September, so he does need an appointment this week for labs and recheck.  I discussed his case with Dr. Dennard Schaumann who agrees to see him due to his complicated medical hx and conditions.  If we can get him scheduled in a 30 min spot that is what Dr. Dennard Schaumann would prefer. (he has a few spots open Friday).  He can do labs prior if he would like, I will put them in.    And he should be following up with his GI doctor to manage his NASH.  He has had two hospitalizations for hepatic encephalopathy in recent months, and he should be following up with GI after each admission. Or if there is a liver specialist in network we may need to get him to a hepatologist.

## 2018-02-14 ENCOUNTER — Other Ambulatory Visit: Payer: Self-pay

## 2018-02-14 ENCOUNTER — Other Ambulatory Visit: Payer: Self-pay | Admitting: Family Medicine

## 2018-02-14 DIAGNOSIS — K7581 Nonalcoholic steatohepatitis (NASH): Principal | ICD-10-CM

## 2018-02-14 DIAGNOSIS — K7460 Unspecified cirrhosis of liver: Secondary | ICD-10-CM

## 2018-02-14 MED ORDER — SPIRONOLACTONE 25 MG PO TABS: 50.0000 mg | ORAL_TABLET | Freq: Every day | ORAL | 1 refills | Status: DC

## 2018-02-14 MED ORDER — FUROSEMIDE 20 MG PO TABS: 20.0000 mg | ORAL_TABLET | Freq: Every day | ORAL | 1 refills | Status: DC

## 2018-02-14 NOTE — Patient Outreach (Signed)
[facility]) Care Management  02/14/2018   Adam Butler 1956-03-12 [id number]   Outreach attempt:  # 1 to the patient for initial assessment. HIPAA verified.  The patient was able to complete the initial assessment.  Social: The patient lives in the home with his wife and brother.  The patient states that he is independent with his ADLS and independent/assist with his IADLS.  He states that he is able to drive himself to appointments and when he does not feel well he will have his wife take him.  Durable medical equipment in the home include CBG meter, scales, cane,  blood pressure cuff and glasses.  Conditions: Per chart review and speaking with the patient his PMH consist of  Esophageal varices, CAD, CHF, Liver Cirrhosis, GERD, DM type 2, Hepatic encephalopathy and Tobacco abuse.  The patient denies any pain.  He states that he has had one fall in the past year with no injury.  I educated the patient on fall prevention.  The patient verbalized understanding.  The patient states that he does walk with a cane for balance. I encouraged the patient to continue with the use of his cane. The patient weighed today and states that his weight was 250 lbs.   He does have some swelling in is legs and he wears support stockings and diabetes socks.  He also states that he keeps his legs elevated when he is sitting in his recliner.  The patient checked his blood sugar this morning and it was 76.  He states that he usually stays around 150.  Reviewed fasting goals, discussed food intake and importance of eating his meals a day and compliance, reviewed signs and symptoms of hypoglycemia, He states that he is trying to do better watching his food intake.  The patient is a smoker and he states that he smokes 5 cigarettes per day.  I discussed with the patient about smoking cessation.   Medications: Per chart review the patient is on 12 medications.  He states that his wife helps him manage his  medications.  He states that he is able to pay for his meds. [facility] pharmacist has tried to contact the patient for medication management.  Appointments: The patient had an appointment with LeisaTapia on 7/24 and a follow up appointment on 9/24.  Advance Directives: The patient states that he has a living will and a form posted on his refrigerator and is unable to tell me what the form is.    Current Medications:  Current Outpatient Medications  Medication Sig Dispense Refill  . cyanocobalamin 1000 MCG tablet Take 1,000 mcg by mouth daily. Vitamin B12    . insulin glargine (LANTUS) 100 UNIT/ML injection Inject 0.1 mLs (10 Units total) into the skin at bedtime. 10 mL 11  . insulin regular (NOVOLIN R,HUMULIN R) 100 units/mL injection Inject 2-8 Units into the skin 3 (three) times daily as needed for high blood sugar (CBG >150).     . INSULIN SYRINGE 1CC/29G (SAFETY INSULIN SYRINGES) 29G X 1/2" 1 ML MISC 1 application by Does not apply route daily. 30 each 0  . lactulose (CHRONULAC) 10 GM/15ML solution Take 45 mLs (30 g total) by mouth 3 (three) times daily. 1892 mL 3  . levofloxacin (LEVAQUIN) 500 MG tablet Take 500 mg by mouth daily.     . magnesium oxide (MAG-OX) 400 (241.3 Mg) MG tablet Take 0.5 tablets (200 mg total) by mouth 2 (two) times daily. 60 tablet 0  .  Multiple Vitamin (MULTIVITAMIN WITH MINERALS) TABS tablet Take 1 tablet by mouth daily.    . nitroGLYCERIN (NITROSTAT) 0.4 MG SL tablet Place 1 tablet (0.4 mg total) under the tongue every 5 (five) minutes as needed for chest pain (chest pain). 25 tablet 5  . Potassium 99 MG TABS Take 99 mg by mouth daily.    . ranitidine (ZANTAC) 150 MG tablet Take 150 mg by mouth 2 (two) times daily as needed for heartburn.     . fluticasone (FLONASE) 50 MCG/ACT nasal spray Place 2 sprays into both nostrils daily as needed for allergies or rhinitis.     . furosemide (LASIX) 20 MG tablet Take 1 tablet (20 mg total) by mouth daily. 90 tablet 1  .  spironolactone (ALDACTONE) 25 MG tablet Take 2 tablets (50 mg total) by mouth daily. 60 tablet 1   No current facility-administered medications for this visit.     Functional Status:  In your present state of health, do you have any difficulty performing the following activities: 02/14/2018 01/25/2018  Hearing? N N  Vision? N N  Difficulty concentrating or making decisions? Y N  Comment - -  Walking or climbing stairs? Y Y  Dressing or bathing? N N  Doing errands, shopping? N N  Some recent data might be hidden    Fall/Depression Screening: Fall Risk  02/14/2018 01/31/2018 06/13/2017  Falls in the past year? Yes Yes No  Comment - - -  Number falls in past yr: 1 2 or more -  Injury with Fall? No Yes -  Risk Factor Category  - - -  Risk for fall due to : Impaired balance/gait - -  Risk for fall due to: Comment - - -  Follow up Education provided;Falls prevention discussed Education provided;Falls prevention discussed -   PHQ 2/9 Scores 02/14/2018 01/31/2018 12/22/2017 06/13/2017 11/17/2016 07/19/2016 01/11/2016  PHQ - 2 Score 1 4 6  0 0 1 0  PHQ- 9 Score - 16 18 - - - -    Assessment: Patient will benefit from health coach outreach for disease management and support.   THN CM Care Plan Problem One     Most Recent Value  Care Plan Problem One  knowledge deficit related diease management.  Role Documenting the Problem One  [place] for Problem One  Active  THN Long Term Goal   In 90 days the patient will lower his a1c of 8.1 by 1-2 points  [facility] Long Term Goal Start Date  02/14/18  Interventions for Problem One Long Term Goal  will send educational material to patient     Plan: [place] will provide ongoing education for patient on diabetes through phone calls and sending printed information to patient for further discussion.  RN Health Coach will send welcome packet with consent to patient as well as printed information on diabetes.  RN Health Coach will send initial  barriers letter, assessment, and care plan to primary care physician RN Health Coach will contact patient in the month of September and patient agrees to next outreach.  Lazaro Arms RN, BSN, [place] Direct Dial:  [phone]  Fax: [phone]

## 2018-02-14 NOTE — Progress Notes (Signed)
Medications previously prescribed were sent to [place].

## 2018-02-14 NOTE — Telephone Encounter (Signed)
I spoke with wife Starla Link, She states that there isn't a prescription at the pharmacy for patient to pick up. She states they went to [facility] to pick up medication.  After I got off the phone with her I see that this medication was sent to the [facility]. She has only been given patient 1 25 mg of spironolactone this week so he didn't run out. She has made an appointment for Friday and labs for tomorrow.   She didn't realize he needed to follow up with GI each time after an admission she will call and make a follow up appointment with them.

## 2018-02-14 NOTE — Telephone Encounter (Signed)
Re-ordered lasix and spironolactone to [facility].  Should be taking 50 mg spironolactone and 20 mg lasix daily  Could be please notify pt or wife that needed medications have been sent to [facility] (per last visits and noted on paperwork) and now also [facility].

## 2018-02-15 ENCOUNTER — Ambulatory Visit: Payer: PPO

## 2018-02-15 ENCOUNTER — Other Ambulatory Visit: Payer: PPO

## 2018-02-15 DIAGNOSIS — K7460 Unspecified cirrhosis of liver: Secondary | ICD-10-CM

## 2018-02-15 DIAGNOSIS — K7581 Nonalcoholic steatohepatitis (NASH): Secondary | ICD-10-CM | POA: Diagnosis not present

## 2018-02-15 LAB — COMPLETE METABOLIC PANEL WITH GFR
AG Ratio: 0.6 (calc) — ABNORMAL LOW (ref 1.0–2.5)
ALBUMIN MSPROF: 2.3 g/dL — AB (ref 3.6–5.1)
ALT: 37 U/L (ref 9–46)
AST: 58 U/L — ABNORMAL HIGH (ref 10–35)
Alkaline phosphatase (APISO): 234 U/L — ABNORMAL HIGH (ref 40–115)
BUN / CREAT RATIO: 18 (calc) (ref 6–22)
BUN: 12 mg/dL (ref 7–25)
CALCIUM: 7.6 mg/dL — AB (ref 8.6–10.3)
CO2: 23 mmol/L (ref 20–32)
Chloride: 105 mmol/L (ref 98–110)
Creat: 0.67 mg/dL — ABNORMAL LOW (ref 0.70–1.25)
GFR, EST AFRICAN AMERICAN: 120 mL/min/{1.73_m2} (ref >=60)
GFR, EST NON AFRICAN AMERICAN: 104 mL/min/{1.73_m2} (ref >=60)
GLOBULIN: 3.8 g/dL — AB (ref 1.9–3.7)
Glucose, Bld: 150 mg/dL — ABNORMAL HIGH (ref 65–99)
Potassium: 3.6 mmol/L (ref 3.5–5.3)
SODIUM: 136 mmol/L (ref 135–146)
TOTAL PROTEIN: 6.1 g/dL (ref 6.1–8.1)
Total Bilirubin: 2.1 mg/dL — ABNORMAL HIGH (ref 0.2–1.2)

## 2018-02-15 LAB — CBC WITH DIFFERENTIAL/PLATELET
BASOS ABS: 39 {cells}/uL (ref 0–200)
BASOS PCT: 0.9 %
EOS ABS: 258 {cells}/uL (ref 15–500)
Eosinophils Relative: 6 %
HEMATOCRIT: 32.3 % — AB (ref 38.5–50.0)
Hemoglobin: 11 g/dL — ABNORMAL LOW (ref 13.2–17.1)
Lymphs Abs: 800 cells/uL — ABNORMAL LOW (ref 850–3900)
MCH: 34.4 pg — ABNORMAL HIGH (ref 27.0–33.0)
MCHC: 34.1 g/dL (ref 32.0–36.0)
MCV: 100.9 fL — AB (ref 80.0–100.0)
MPV: 12.3 fL (ref 7.5–12.5)
Monocytes Relative: 10.9 %
NEUTROS PCT: 63.6 %
Neutro Abs: 2735 cells/uL (ref 1500–7800)
Platelets: 46 10*3/uL — ABNORMAL LOW (ref 140–400)
RBC: 3.2 10*6/uL — ABNORMAL LOW (ref 4.20–5.80)
RDW: 13.8 % (ref 11.0–15.0)
TOTAL LYMPHOCYTE: 18.6 %
WBC: 4.3 10*3/uL (ref 3.8–10.8)
WBCMIX: 469 {cells}/uL (ref 200–950)

## 2018-02-15 LAB — MAGNESIUM: Magnesium: 1.3 mg/dL — ABNORMAL LOW (ref 1.5–2.5)

## 2018-02-16 ENCOUNTER — Encounter: Payer: Self-pay | Admitting: Family Medicine

## 2018-02-16 ENCOUNTER — Ambulatory Visit (INDEPENDENT_AMBULATORY_CARE_PROVIDER_SITE_OTHER): Payer: PPO | Admitting: Family Medicine

## 2018-02-16 VITALS — BP 120/58 | HR 82 | Temp 97.9°F | Resp 18 | Ht 66.0 in | Wt 262.0 lb

## 2018-02-16 DIAGNOSIS — E876 Hypokalemia: Secondary | ICD-10-CM

## 2018-02-16 DIAGNOSIS — K7581 Nonalcoholic steatohepatitis (NASH): Secondary | ICD-10-CM | POA: Diagnosis not present

## 2018-02-16 DIAGNOSIS — Z794 Long term (current) use of insulin: Secondary | ICD-10-CM

## 2018-02-16 DIAGNOSIS — E119 Type 2 diabetes mellitus without complications: Secondary | ICD-10-CM | POA: Diagnosis not present

## 2018-02-16 DIAGNOSIS — K7460 Unspecified cirrhosis of liver: Secondary | ICD-10-CM

## 2018-02-16 LAB — AMMONIA: AMMONIA: 177 umol/L — AB (ref <=72)

## 2018-02-16 MED ORDER — SPIRONOLACTONE 50 MG PO TABS: 50.0000 mg | ORAL_TABLET | Freq: Two times a day (BID) | ORAL | 1 refills | Status: DC

## 2018-02-16 NOTE — Progress Notes (Signed)
Subjective:    Patient ID: Adam Butler, male    DOB: 03-04-1956, 62 y.o.   MRN: [id number]  HPI  Patient is transferring care to me from my partner due to medical complexity.  I have copied relevant portions of her recent office visit for my reference:  Adam Butler is a 62 y.o. male, presents to clinic with CC of hospital followup after admission 01/25/18-01/27/18 for hepatic encephalopathy, cirrhosis secondary to NASH, IDDM with hyperglycemia and hypokalemia.  Pt was fluid overloaded ~ 30 lbs at 270 lbs (lowest recent weight near my first visit with this pt was 240 lbs), altered and tachypneic.  He has his paperwork with him with several medication changes that he has not been compliant with.   Lactulose 30 mg TID, pt states he is taking 10 mg lactulose once nightly.  Pt states that works better for his BM's and it is allowing him to get some sleep and he is less sleepy throughout the daytime.  His insulin was also changed.  His was instructed to do lantus 10 units at night and Novolin R sliding scale TID. He does not want to take lantus, saying it was too expensive in the past, and he has been doing novolin R for years and it is affordable.  He reports morning sugars 122-124 in the am.  A recent low was 80 didn't feel well, sweaty and hungry, 180 highest after eating more at night.   He was discharged from the hospital with decreased spironolactone and increased lasix dose.  He is breathing good, is walking a lot, feeling better.  He was "feeling drunk" and sleeping throughout the day and now he is sleeping better at night and awake during the day more.  But he does not feel like he's not getting enough fluid off his legs despite taking the medicines.  He still has severely swollen feet, ankles and legs, and his abdomen does also feel enlarged with swelling in abdominal skin as well.  He wants to wear compression stockings but is having difficulty getting them on.    GI/Dr. Hilarie Fredrickson - pt  states he saw GI while in the hospital, he has spoken to their office, appointment pending and f/up LUQ [place] pending.   02/16/18 HgbA1c was 8.1 in 10/2017 Appointment on 02/15/2018  Component Date Value Ref Range Status  . Glucose, Bld 02/15/2018 150* 65 - 99 mg/dL Final   Comment: .            Fasting reference interval . For someone without known diabetes, a glucose value >125 mg/dL indicates that they may have diabetes and this should be confirmed with a follow-up test. .   . BUN 02/15/2018 12  7 - 25 mg/dL Final  . Creat 02/15/2018 0.67* 0.70 - 1.25 mg/dL Final   Comment: For patients >55 years of age, the reference limit for Creatinine is approximately 13% higher for people identified as African-American. .   . GFR, Est Non African American 02/15/2018 104  > OR = 60 mL/min/1.55m Final  . GFR, Est African American 02/15/2018 120  > OR = 60 mL/min/1.737mFinal  . BUN/Creatinine Ratio 02/15/2018 18  6 - 22 (calc) Final  . Sodium 02/15/2018 136  135 - 146 mmol/L Final  . Potassium 02/15/2018 3.6  3.5 - 5.3 mmol/L Final  . Chloride 02/15/2018 105  98 - 110 mmol/L Final  . CO2 02/15/2018 23  20 - 32 mmol/L Final  . Calcium 02/15/2018 7.6* 8.6 -  10.3 mg/dL Final  . Total Protein 02/15/2018 6.1  6.1 - 8.1 g/dL Final  . Albumin 02/15/2018 2.3* 3.6 - 5.1 g/dL Final  . Globulin 02/15/2018 3.8* 1.9 - 3.7 g/dL (calc) Final  . AG Ratio 02/15/2018 0.6* 1.0 - 2.5 (calc) Final  . Total Bilirubin 02/15/2018 2.1* 0.2 - 1.2 mg/dL Final  . Alkaline phosphatase (APISO) 02/15/2018 234* 40 - 115 U/L Final  . AST 02/15/2018 58* 10 - 35 U/L Final  . ALT 02/15/2018 37  9 - 46 U/L Final  . WBC 02/15/2018 4.3  3.8 - 10.8 Thousand/uL Final  . RBC 02/15/2018 3.20* 4.20 - 5.80 Million/uL Final  . Hemoglobin 02/15/2018 11.0* 13.2 - 17.1 g/dL Final  . HCT 02/15/2018 32.3* 38.5 - 50.0 % Final  . MCV 02/15/2018 100.9* 80.0 - 100.0 fL Final  . MCH 02/15/2018 34.4* 27.0 - 33.0 pg Final  . MCHC 02/15/2018  34.1  32.0 - 36.0 g/dL Final  . RDW 02/15/2018 13.8  11.0 - 15.0 % Final  . Platelets 02/15/2018 46* 140 - 400 Thousand/uL Final  . MPV 02/15/2018 12.3  7.5 - 12.5 fL Final  . Neutro Abs 02/15/2018 2,735  1,500 - 7,800 cells/uL Final  . Lymphs Abs 02/15/2018 800* 850 - 3,900 cells/uL Final  . WBC mixed population 02/15/2018 469  200 - 950 cells/uL Final  . Eosinophils Absolute 02/15/2018 258  15 - 500 cells/uL Final  . Basophils Absolute 02/15/2018 39  0 - 200 cells/uL Final  . Neutrophils Relative % 02/15/2018 63.6  % Final  . Total Lymphocyte 02/15/2018 18.6  % Final  . Monocytes Relative 02/15/2018 10.9  % Final  . Eosinophils Relative 02/15/2018 6.0  % Final  . Basophils Relative 02/15/2018 0.9  % Final  . Smear Review 02/15/2018    Final   Comment: No platelet clumps seen. Review of peripheral smear confirms automated results.   . Magnesium 02/15/2018 1.3* 1.5 - 2.5 mg/dL Final  . Ammonia 02/15/2018 177* < OR = 72 umol/L Final    Patient had an EGD performed 01/02/2018 which revealed Candida esophagitis as well as small esophageal varices.  He is not currently taking propranolol.  He is on Levaquin for prevention of spontaneous bacterial peritonitis.  I see no evidence that he is followed up with GI as planned.  His potassium levels remain borderline low.  Magnesium levels remain low.  Patient states he is currently taking lactulose once a day but this is allowing him 5-6 bowel movements a day.  He is taking 40 mg of Lasix once a day and only 25 mg of Spironolactone once a day.  He has +2 pitting edema in both legs.  His weight is still 11 pounds heavier than his baseline when he established with [place].  There are faint bibasilar crackles and obvious ascites on his exam.  He appears fluid overloaded. Past Medical History:  Diagnosis Date  . Arthritis   . Cholelithiasis   . Cirrhosis ([place]) 2011   Cryptogenic, Likely NASH. Family/pt deny EtOH. HCV, HBV, HAV negative. ANA negative. AMA  positive. Ascites 12/11  . Coronary artery disease    Inferior MI 12/11; LHC with occluded mid CFX and 80% proximal RCA. EF 55%. He had 3.0 x 28 vision BMS to CFX  . Diastolic CHF, acute ([place])    Echo 12/11 with ef 50-55% and mild LVH. EF 55% by LV0gram in 12/11  . Esophageal varices ([place]) 2011, 2013   no hx acute variceal bleed  .  Hepatic encephalopathy ([place]) 2011, 12/2013  . Hyperlipemia   . Myocardial infarction [facility]) ? 2012  . PFO (patent foramen ovale): Per TEE 04/20/2015 04/20/2015  . Portal hypertension ([place])   . Rectal varices   . S/P coronary artery stent placement 06/2010  . SVT (supraventricular tachycardia) ([place])    1/12: appeared to be an ectopic atrial tachycardia. Required DCCV with hemodynamic instability  . Type II diabetes mellitus ([place]) 2011   Past Surgical History:  Procedure Laterality Date  . APPENDECTOMY    . BIOPSY  01/02/2018   Procedure: BIOPSY;  Surgeon: Jerene Bears, MD;  Location: [facility] ENDOS[facility];  Service: Gastroenterology;;  . CARDIAC CATHETERIZATION  07/01/2010   BMS to CFX.  Marland Kitchen CATARACT EXTRACTION W/PHACO Left 04/28/2016   Procedure: CATARACT EXTRACTION PHACO AND INTRAOCULAR LENS PLACEMENT (IOC);  Surgeon: Leandrew Koyanagi, MD;  Location: [facility] ORS;  Service: Ophthalmology;  Laterality: Left;  Lot# [id number] H [place]: 05:31.7 AP%: 28.2 CDE: 93.48   . COLONOSCOPY  04/13/2012   Procedure: COLONOSCOPY;  Surgeon: Inda Castle, MD;  Location: [facility];  Service: Endoscopy;  Laterality: N/A;  . COLONOSCOPY WITH PROPOFOL N/A 03/22/2017   Procedure: COLONOSCOPY WITH PROPOFOL;  Surgeon: Doran Stabler, MD;  Location: [facility];  Service: Gastroenterology;  Laterality: N/A;  . COLONOSCOPY WITH PROPOFOL N/A 01/02/2018   Procedure: COLONOSCOPY WITH PROPOFOL;  Surgeon: Jerene Bears, MD;  Location: [facility];  Service: Gastroenterology;  Laterality: N/A;  . CORONARY ANGIOPLASTY    . CORONARY STENT PLACEMENT  06/30/2010   CFX   Distal        . ESOPHAGEAL  BANDING N/A 01/01/2014   Procedure: ESOPHAGEAL BANDING;  Surgeon: Jerene Bears, MD;  Location: [facility] ENDOSCOPY;  Service: Endoscopy;  Laterality: N/A;  . ESOPHAGOGASTRODUODENOSCOPY  04/13/2012   Procedure: ESOPHAGOGASTRODUODENOSCOPY (EGD);  Surgeon: Inda Castle, MD;  Location: [facility];  Service: Endoscopy;  Laterality: N/A;  . ESOPHAGOGASTRODUODENOSCOPY N/A 01/01/2014   Procedure: ESOPHAGOGASTRODUODENOSCOPY (EGD);  Surgeon: Jerene Bears, MD;  Location: [facility] ENDOSCOPY;  Service: Endoscopy;  Laterality: N/A;  . ESOPHAGOGASTRODUODENOSCOPY (EGD) WITH PROPOFOL N/A 07/12/2016   Procedure: ESOPHAGOGASTRODUODENOSCOPY (EGD) WITH PROPOFOL;  Surgeon: Jerene Bears, MD;  Location: [facility];  Service: Gastroenterology;  Laterality: N/A;  . ESOPHAGOGASTRODUODENOSCOPY (EGD) WITH PROPOFOL N/A 01/02/2018   Procedure: ESOPHAGOGASTRODUODENOSCOPY (EGD) WITH PROPOFOL;  Surgeon: Jerene Bears, MD;  Location: [facility];  Service: Gastroenterology;  Laterality: N/A;  . LEFT HEART CATHETERIZATION WITH CORONARY ANGIOGRAM N/A 10/15/2014   Procedure: LEFT HEART CATHETERIZATION WITH CORONARY ANGIOGRAM;  Surgeon: Peter M Martinique, MD;  Location: [facility] CATH LAB;  Service: Cardiovascular;  Laterality: N/A;  . orif r leg    . POLYPECTOMY  01/02/2018   Procedure: POLYPECTOMY;  Surgeon: Jerene Bears, MD;  Location: [facility];  Service: Gastroenterology;;  . TEE WITHOUT CARDIOVERSION N/A 04/20/2015   Procedure: TRANSESOPHAGEAL ECHOCARDIOGRAM (TEE);  Surgeon: Fay Records, MD;  Location: [facility];  Service: Cardiovascular;  Laterality: N/A;  . TEE WITHOUT CARDIOVERSION N/A 05/17/2017   Procedure: TRANSESOPHAGEAL ECHOCARDIOGRAM (TEE);  Surgeon: Josue Hector, MD;  Location: [facility] ENDOSCOPY;  Service: Cardiovascular;  Laterality: N/A;   Current Outpatient Medications on File Prior to Visit  Medication Sig Dispense Refill  . cyanocobalamin 1000 MCG tablet Take 1,000 mcg by mouth daily. Vitamin B12    . furosemide (LASIX) 20 MG  tablet Take 1 tablet (20 mg total) by mouth daily. 90 tablet 1  . insulin regular (NOVOLIN R,HUMULIN R) 100 units/mL injection  Inject 2-8 Units into the skin 3 (three) times daily as needed for high blood sugar (CBG >150).     . INSULIN SYRINGE 1CC/29G (SAFETY INSULIN SYRINGES) 29G X 1/2" 1 ML MISC 1 application by Does not apply route daily. 30 each 0  . lactulose (CHRONULAC) 10 GM/15ML solution Take 45 mLs (30 g total) by mouth 3 (three) times daily. 1892 mL 3  . levofloxacin (LEVAQUIN) 500 MG tablet Take 500 mg by mouth daily.     . Multiple Vitamin (MULTIVITAMIN WITH MINERALS) TABS tablet Take 1 tablet by mouth daily.    . nitroGLYCERIN (NITROSTAT) 0.4 MG SL tablet Place 1 tablet (0.4 mg total) under the tongue every 5 (five) minutes as needed for chest pain (chest pain). 25 tablet 5  . Potassium 99 MG TABS Take 99 mg by mouth daily.    . ranitidine (ZANTAC) 150 MG tablet Take 150 mg by mouth 2 (two) times daily as needed for heartburn.     . spironolactone (ALDACTONE) 25 MG tablet Take 2 tablets (50 mg total) by mouth daily. 60 tablet 1   No current facility-administered medications on file prior to visit.    Allergies  Allergen Reactions  . Isosorbide Nitrate Other (See Comments)    Nose bleeds   Social History   Socioeconomic History  . Marital status: Married    Spouse name: Not on file  . Number of children: 3  . Years of education: Not on file  . Highest education level: Not on file  Occupational History  . Occupation: Merchandiser, retail: OTHER    Comment: Worked in Theatre manager prior  Social Needs  . Financial resource strain: Not on file  . Food insecurity:    Worry: Not on file    Inability: Not on file  . Transportation needs:    Medical: Not on file    Non-medical: Not on file  Tobacco Use  . Smoking status: Current Every Day Smoker    Packs/day: 0.25    Years: 36.00    Pack years: 9.00    Types: E-cigarettes, Cigarettes  . Smokeless tobacco: Never Used    . Tobacco comment: uses vapor cigarettes (2016 ); I smoke about 4 cigarettes a day  Substance and Sexual Activity  . Alcohol use: No    Alcohol/week: 0.0 standard drinks  . Drug use: No  . Sexual activity: Never  Lifestyle  . Physical activity:    Days per week: Not on file    Minutes per session: Not on file  . Stress: Not on file  Relationships  . Social connections:    Talks on phone: Not on file    Gets together: Not on file    Attends religious service: Not on file    Active member of club or organization: Not on file    Attends meetings of clubs or organizations: Not on file    Relationship status: Not on file  . Intimate partner violence:    Fear of current or ex partner: Not on file    Emotionally abused: Not on file    Physically abused: Not on file    Forced sexual activity: Not on file  Other Topics Concern  . Not on file  Social History Narrative   Married   Gets regular exercise: walking    Review of Systems  All other systems reviewed and are negative.      Objective:   Physical Exam  Constitutional: He appears well-developed and  well-nourished. No distress.  Neck: No JVD present.  Cardiovascular: Normal rate and regular rhythm.  Murmur heard. Pulmonary/Chest: He is in respiratory distress. He has rales.  Abdominal: Soft. Bowel sounds are normal. He exhibits distension. He exhibits no mass. There is no tenderness. There is no rebound and no guarding.  Musculoskeletal: He exhibits edema.  Skin: He is not diaphoretic.  Vitals reviewed.         Assessment & Plan:  Liver cirrhosis secondary to NASH ([place])  Hypomagnesemia  Hypokalemia  Type 2 diabetes mellitus without complication, with long-term current use of insulin ([place])  We need to optimize the management of his cirrhosis.  First and foremost I will schedule the patient to follow-up with Dr. Hilarie Fredrickson.  I have recommended that he follow-up with Dr. Hilarie Fredrickson regularly most likely every 3 to 4  months until stable.  Second we need to address his fluid overload.  I would like to get the patient ultimately on a 100 mg a day of spironolactone if he can tolerate this.  Therefore he will continue Lasix 40 mg a day in the morning.  We will add spironolactone 50 mg p.o. twice daily.  He can discontinue his potassium supplement.  Reassess the patient in 1 week to monitor for evidence of dehydration or hypotension or hyperkalemia.  Next we need to address his esophageal varices to prevent future upper GI bleeds.  If his blood pressure can tolerate the increased dose of spironolactone in 1 week I plan to start the patient on propranolol 20 mg p.o. twice daily.  He will continue Levaquin for SBP prophylaxis.  I have encouraged the patient to titrate his lactulose to achieve 5-6 bowel movements per day.  If he is truly accomplishing this with 1 dose of lactulose then I would not make any changes at the present time.  However I encouraged his wife to monitor the patient and if he is not going to the bathroom regularly she will need to increase the lactulose to 3 times daily.  Patient is currently doing sliding scale insulin.  He is not taking Lantus.  I have asked him to record his pre-meal blood sugars with breakfast lunch and dinner as well as his evening blood sugar and return in 1 week for me to review.  I believe he would do better with basal insulin however I will review his sugars and see how well he is controlling his sugars with his Novolin R.

## 2018-02-19 NOTE — Telephone Encounter (Signed)
Patient came in and saw Dr. Dennard Schaumann on 02/16/2018

## 2018-02-21 ENCOUNTER — Telehealth: Payer: Self-pay | Admitting: Internal Medicine

## 2018-02-21 NOTE — Telephone Encounter (Signed)
Looks like this was filled by Verline Lema, PA-C. Will send to their office to ok.Marland KitchenMarland Kitchen

## 2018-02-21 NOTE — Telephone Encounter (Signed)
Patient states he went to get medication lactulose refilled at [facility], and they were out. Patient wanting to know if refill can be sent to [facility] outpatient instead. Patient last seen 01.2019.

## 2018-02-22 ENCOUNTER — Other Ambulatory Visit: Payer: Self-pay | Admitting: Family Medicine

## 2018-02-22 ENCOUNTER — Other Ambulatory Visit: Payer: Self-pay

## 2018-02-22 DIAGNOSIS — K7682 Hepatic encephalopathy: Secondary | ICD-10-CM

## 2018-02-22 DIAGNOSIS — K7290 Hepatic failure, unspecified without coma: Secondary | ICD-10-CM

## 2018-02-22 MED ORDER — LACTULOSE 10 GM/15ML PO SOLN: 30.0000 g | Freq: Three times a day (TID) | ORAL | 3 refills | Status: DC

## 2018-02-22 NOTE — Telephone Encounter (Signed)
Spoke with patient and informed him that Lactulose was sent to the pharmacy. Patient verbalized understanding.

## 2018-02-22 NOTE — Progress Notes (Signed)
Made sure pt had lactulose at [facility] -he is seeing Dr. Dennard Schaumann now, f/up scheduled

## 2018-02-22 NOTE — Progress Notes (Signed)
Refill on Lactulose sent into [facility] because it was sent to [facility]

## 2018-02-22 NOTE — Telephone Encounter (Signed)
Please notify pt that lactulose rx should be at [facility]

## 2018-02-23 ENCOUNTER — Encounter: Payer: Self-pay | Admitting: Family Medicine

## 2018-02-23 ENCOUNTER — Ambulatory Visit (INDEPENDENT_AMBULATORY_CARE_PROVIDER_SITE_OTHER): Payer: PPO | Admitting: Family Medicine

## 2018-02-23 ENCOUNTER — Ambulatory Visit: Payer: PPO | Admitting: Family Medicine

## 2018-02-23 VITALS — BP 110/58 | HR 83 | Temp 97.7°F | Resp 20 | Ht 66.0 in | Wt 257.0 lb

## 2018-02-23 DIAGNOSIS — K7581 Nonalcoholic steatohepatitis (NASH): Secondary | ICD-10-CM | POA: Diagnosis not present

## 2018-02-23 DIAGNOSIS — K7460 Unspecified cirrhosis of liver: Secondary | ICD-10-CM | POA: Diagnosis not present

## 2018-02-23 DIAGNOSIS — E876 Hypokalemia: Secondary | ICD-10-CM | POA: Diagnosis not present

## 2018-02-23 NOTE — Progress Notes (Signed)
Subjective:    Patient ID: Adam Butler, male    DOB: Sep 02, 1955, 62 y.o.   MRN: [id number]  HPI 02/16/18 Patient is transferring care to me from my partner due to medical complexity.  I have copied relevant portions of her recent office visit for my reference:  Adam Butler is a 62 y.o. male, presents to clinic with CC of hospital followup after admission 01/25/18-01/27/18 for hepatic encephalopathy, cirrhosis secondary to NASH, IDDM with hyperglycemia and hypokalemia.  Pt was fluid overloaded ~ 30 lbs at 270 lbs (lowest recent weight near my first visit with this pt was 240 lbs), altered and tachypneic.  He has his paperwork with him with several medication changes that he has not been compliant with.   Lactulose 30 mg TID, pt states he is taking 10 mg lactulose once nightly.  Pt states that works better for his BM's and it is allowing him to get some sleep and he is less sleepy throughout the daytime.  His insulin was also changed.  His was instructed to do lantus 10 units at night and Novolin R sliding scale TID. He does not want to take lantus, saying it was too expensive in the past, and he has been doing novolin R for years and it is affordable.  He reports morning sugars 122-124 in the am.  A recent low was 80 didn't feel well, sweaty and hungry, 180 highest after eating more at night.   He was discharged from the hospital with decreased spironolactone and increased lasix dose.  He is breathing good, is walking a lot, feeling better.  He was "feeling drunk" and sleeping throughout the day and now he is sleeping better at night and awake during the day more.  But he does not feel like he's not getting enough fluid off his legs despite taking the medicines.  He still has severely swollen feet, ankles and legs, and his abdomen does also feel enlarged with swelling in abdominal skin as well.  He wants to wear compression stockings but is having difficulty getting them on.    GI/Dr. Hilarie Fredrickson -  pt states he saw GI while in the hospital, he has spoken to their office, appointment pending and f/up LUQ [place] pending.   02/16/18 Patient had an EGD performed 01/02/2018 which revealed Candida esophagitis as well as small esophageal varices.  He is not currently taking propranolol.  He is on Levaquin for prevention of spontaneous bacterial peritonitis.  I see no evidence that he is followed up with GI as planned.  His potassium levels remain borderline low.  Magnesium levels remain low.  Patient states he is currently taking lactulose once a day but this is allowing him 5-6 bowel movements a day.  He is taking 40 mg of Lasix once a day and only 25 mg of Spironolactone once a day.  He has +2 pitting edema in both legs.  His weight is still 11 pounds heavier than his baseline when he established with [place].  There are faint bibasilar crackles and obvious ascites on his exam.  He appears fluid overloaded.  At that time, my plan was:  We need to optimize the management of his cirrhosis.  First and foremost I will schedule the patient to follow-up with Dr. Hilarie Fredrickson.  I have recommended that he follow-up with Dr. Hilarie Fredrickson regularly most likely every 3 to 4 months until stable.  Second we need to address his fluid overload.  I would like to get the patient  ultimately on a 100 mg a day of spironolactone if he can tolerate this.  Therefore he will continue Lasix 40 mg a day in the morning.  We will add spironolactone 50 mg p.o. twice daily.  He can discontinue his potassium supplement.  Reassess the patient in 1 week to monitor for evidence of dehydration or hypotension or hyperkalemia.  Next we need to address his esophageal varices to prevent future upper GI bleeds.  If his blood pressure can tolerate the increased dose of spironolactone in 1 week I plan to start the patient on propranolol 20 mg p.o. twice daily.  He will continue Levaquin for SBP prophylaxis.  I have encouraged the patient to titrate his lactulose to achieve  5-6 bowel movements per day.  If he is truly accomplishing this with 1 dose of lactulose then I would not make any changes at the present time.  However I encouraged his wife to monitor the patient and if he is not going to the bathroom regularly she will need to increase the lactulose to 3 times daily.  Patient is currently doing sliding scale insulin.  He is not taking Lantus.  I have asked him to record his pre-meal blood sugars with breakfast lunch and dinner as well as his evening blood sugar and return in 1 week for me to review.  I believe he would do better with basal insulin however I will review his sugars and see how well he is controlling his sugars with his Novolin R.  02/23/18 Wt Readings from Last 3 Encounters:  02/23/18 257 lb (116.6 kg)  02/16/18 262 lb (118.8 kg)  01/31/18 262 lb (118.8 kg)   Patient has lost 5 lbs over the last week.  He is approaching his baseline weight.  Patient has an appointment to see the gastroenterologist in October.  While his swelling is better, he is still swollen significantly in his left leg +2, and +1 in his right leg.  His lungs sound better today.  His wife states that once this week, he was demonstrating asterixis and some confusion however she was able to modify his lactulose and avoid taking him to the hospital.  I emphasized that he needs to have 3 bowel movements a day and that we may need to increase his lactulose accordingly to achieve this particularly if he is confused or disoriented.  Today he appears to be at his baseline.  He presents with fasting sugars.  Typically his fasting sugars are 1 40-1 88.  His sugars later in the day typically are between 203 100.  He is unable to tolerate/afford Lantus or basal insulin.  He is using only regular insulin.  We discussed for more than 25 minutes a day his diet.  He is eating fried foods, fast foods, high carbohydrate diet.  He refuses to eat vegetables.  This is based on taste preferences.  However the  patient did not realize that his cirrhosis was due to fatty liver disease.  I explained to him that fast food fried foods high carbohydrate diet contributes this and that this type of diet will expedite medical problems.  Patient states that he was unaware of this.  I also explained to the patient that a high carbohydrate diet exacerbates his blood sugar control.  His blood pressure today however is a little too low I think to add the propranolol for his esophageal varices. Past Medical History:  Diagnosis Date  . Arthritis   . Cholelithiasis   . Cirrhosis (  [place]) 2011   Cryptogenic, Likely NASH. Family/pt deny EtOH. HCV, HBV, HAV negative. ANA negative. AMA positive. Ascites 12/11  . Coronary artery disease    Inferior MI 12/11; LHC with occluded mid CFX and 80% proximal RCA. EF 55%. He had 3.0 x 28 vision BMS to CFX  . Diastolic CHF, acute ([place])    Echo 12/11 with ef 50-55% and mild LVH. EF 55% by LV0gram in 12/11  . Esophageal varices ([place]) 2011, 2013   no hx acute variceal bleed  . Hepatic encephalopathy ([place]) 2011, 12/2013  . Hyperlipemia   . Myocardial infarction [facility] ? 2012  . PFO (patent foramen ovale): Per TEE 04/20/2015 04/20/2015  . Portal hypertension ([place])   . Rectal varices   . S/P coronary artery stent placement 06/2010  . SVT (supraventricular tachycardia) ([place])    1/12: appeared to be an ectopic atrial tachycardia. Required DCCV with hemodynamic instability  . Type II diabetes mellitus ([place]) 2011   Past Surgical History:  Procedure Laterality Date  . APPENDECTOMY    . BIOPSY  01/02/2018   Procedure: BIOPSY;  Surgeon: Jerene Bears, MD;  Location: [facility];  Service: Gastroenterology;;  . CARDIAC CATHETERIZATION  07/01/2010   BMS to CFX.  Marland Kitchen CATARACT EXTRACTION W/PHACO Left 04/28/2016   Procedure: CATARACT EXTRACTION PHACO AND INTRAOCULAR LENS PLACEMENT (IOC);  Surgeon: Leandrew Koyanagi, MD;  Location: [facility] ORS;  Service: Ophthalmology;  Laterality: Left;  Lot#  [id number] H [place]: 05:31.7 AP%: 28.2 CDE: 93.48   . COLONOSCOPY  04/13/2012   Procedure: COLONOSCOPY;  Surgeon: Inda Castle, MD;  Location: [facility];  Service: Endoscopy;  Laterality: N/A;  . COLONOSCOPY WITH PROPOFOL N/A 03/22/2017   Procedure: COLONOSCOPY WITH PROPOFOL;  Surgeon: Doran Stabler, MD;  Location: [facility];  Service: Gastroenterology;  Laterality: N/A;  . COLONOSCOPY WITH PROPOFOL N/A 01/02/2018   Procedure: COLONOSCOPY WITH PROPOFOL;  Surgeon: Jerene Bears, MD;  Location: [facility];  Service: Gastroenterology;  Laterality: N/A;  . CORONARY ANGIOPLASTY    . CORONARY STENT PLACEMENT  06/30/2010   CFX   Distal        . ESOPHAGEAL BANDING N/A 01/01/2014   Procedure: ESOPHAGEAL BANDING;  Surgeon: Jerene Bears, MD;  Location: [facility] ENDOSCOPY;  Service: Endoscopy;  Laterality: N/A;  . ESOPHAGOGASTRODUODENOSCOPY  04/13/2012   Procedure: ESOPHAGOGASTRODUODENOSCOPY (EGD);  Surgeon: Inda Castle, MD;  Location: [facility];  Service: Endoscopy;  Laterality: N/A;  . ESOPHAGOGASTRODUODENOSCOPY N/A 01/01/2014   Procedure: ESOPHAGOGASTRODUODENOSCOPY (EGD);  Surgeon: Jerene Bears, MD;  Location: [facility] ENDOSCOPY;  Service: Endoscopy;  Laterality: N/A;  . ESOPHAGOGASTRODUODENOSCOPY (EGD) WITH PROPOFOL N/A 07/12/2016   Procedure: ESOPHAGOGASTRODUODENOSCOPY (EGD) WITH PROPOFOL;  Surgeon: Jerene Bears, MD;  Location: [facility];  Service: Gastroenterology;  Laterality: N/A;  . ESOPHAGOGASTRODUODENOSCOPY (EGD) WITH PROPOFOL N/A 01/02/2018   Procedure: ESOPHAGOGASTRODUODENOSCOPY (EGD) WITH PROPOFOL;  Surgeon: Jerene Bears, MD;  Location: [facility];  Service: Gastroenterology;  Laterality: N/A;  . LEFT HEART CATHETERIZATION WITH CORONARY ANGIOGRAM N/A 10/15/2014   Procedure: LEFT HEART CATHETERIZATION WITH CORONARY ANGIOGRAM;  Surgeon: Peter M Martinique, MD;  Location: [facility] CATH LAB;  Service: Cardiovascular;  Laterality: N/A;  . orif r leg    . POLYPECTOMY  01/02/2018   Procedure: POLYPECTOMY;   Surgeon: Jerene Bears, MD;  Location: [facility];  Service: Gastroenterology;;  . TEE WITHOUT CARDIOVERSION N/A 04/20/2015   Procedure: TRANSESOPHAGEAL ECHOCARDIOGRAM (TEE);  Surgeon: Fay Records, MD;  Location: [facility];  Service: Cardiovascular;  Laterality:  N/A;  . TEE WITHOUT CARDIOVERSION N/A 05/17/2017   Procedure: TRANSESOPHAGEAL ECHOCARDIOGRAM (TEE);  Surgeon: Josue Hector, MD;  Location: [facility] ENDOSCOPY;  Service: Cardiovascular;  Laterality: N/A;   Current Outpatient Medications on File Prior to Visit  Medication Sig Dispense Refill  . cyanocobalamin 1000 MCG tablet Take 1,000 mcg by mouth daily. Vitamin B12    . furosemide (LASIX) 20 MG tablet Take 1 tablet (20 mg total) by mouth daily. 90 tablet 1  . insulin regular (NOVOLIN R,HUMULIN R) 100 units/mL injection Inject 2-8 Units into the skin 3 (three) times daily as needed for high blood sugar (CBG >150).     . INSULIN SYRINGE 1CC/29G (SAFETY INSULIN SYRINGES) 29G X 1/2" 1 ML MISC 1 application by Does not apply route daily. 30 each 0  . lactulose (CHRONULAC) 10 GM/15ML solution Take 45 mLs (30 g total) by mouth 3 (three) times daily. 1892 mL 3  . levofloxacin (LEVAQUIN) 500 MG tablet Take 500 mg by mouth daily.     . Multiple Vitamin (MULTIVITAMIN WITH MINERALS) TABS tablet Take 1 tablet by mouth daily.    . nitroGLYCERIN (NITROSTAT) 0.4 MG SL tablet Place 1 tablet (0.4 mg total) under the tongue every 5 (five) minutes as needed for chest pain (chest pain). 25 tablet 5  . Potassium 99 MG TABS Take 99 mg by mouth daily.    . ranitidine (ZANTAC) 150 MG tablet Take 150 mg by mouth 2 (two) times daily as needed for heartburn.     . spironolactone (ALDACTONE) 25 MG tablet Take 2 tablets (50 mg total) by mouth daily. 60 tablet 1  . spironolactone (ALDACTONE) 50 MG tablet Take 1 tablet (50 mg total) by mouth 2 (two) times daily. 60 tablet 1   No current facility-administered medications on file prior to visit.    Allergies    Allergen Reactions  . Isosorbide Nitrate Other (See Comments)    Nose bleeds   Social History   Socioeconomic History  . Marital status: Married    Spouse name: Not on file  . Number of children: 3  . Years of education: Not on file  . Highest education level: Not on file  Occupational History  . Occupation: Merchandiser, retail: OTHER    Comment: Worked in Theatre manager prior  Social Needs  . Financial resource strain: Not on file  . Food insecurity:    Worry: Not on file    Inability: Not on file  . Transportation needs:    Medical: Not on file    Non-medical: Not on file  Tobacco Use  . Smoking status: Current Every Day Smoker    Packs/day: 0.25    Years: 36.00    Pack years: 9.00    Types: E-cigarettes, Cigarettes  . Smokeless tobacco: Never Used  . Tobacco comment: uses vapor cigarettes (2016 ); I smoke about 4 cigarettes a day  Substance and Sexual Activity  . Alcohol use: No    Alcohol/week: 0.0 standard drinks  . Drug use: No  . Sexual activity: Never  Lifestyle  . Physical activity:    Days per week: Not on file    Minutes per session: Not on file  . Stress: Not on file  Relationships  . Social connections:    Talks on phone: Not on file    Gets together: Not on file    Attends religious service: Not on file    Active member of club or organization: Not on file  Attends meetings of clubs or organizations: Not on file    Relationship status: Not on file  . Intimate partner violence:    Fear of current or ex partner: Not on file    Emotionally abused: Not on file    Physically abused: Not on file    Forced sexual activity: Not on file  Other Topics Concern  . Not on file  Social History Narrative   Married   Gets regular exercise: walking    Review of Systems  All other systems reviewed and are negative.      Objective:   Physical Exam  Constitutional: He appears well-developed and well-nourished. No distress.  Neck: No JVD present.   Cardiovascular: Normal rate and regular rhythm.  Murmur heard. Pulmonary/Chest: No respiratory distress. He has no rales.  Abdominal: Soft. Bowel sounds are normal. He exhibits distension. He exhibits no mass. There is no tenderness. There is no rebound and no guarding.  Musculoskeletal: He exhibits edema.  Skin: He is not diaphoretic.  Vitals reviewed.         Assessment & Plan:  Liver cirrhosis secondary to NASH ([place]) - Plan: COMPLETE METABOLIC PANEL WITH GFR, Magnesium  Hypokalemia  Hypomagnesemia - Plan: COMPLETE METABOLIC PANEL WITH GFR, Magnesium Patient's fluid overload has improved but still not at his baseline.  Continue spironolactone 50 mg p.o. twice daily and Lasix 40 mg a day.  Recheck a CMP to monitor his renal function and his potassium.  Patient does not appear clinically dehydrated.  He still appears to be fluid overloaded.  Therefore I will continue this dose diuretic and recheck the patient in 1 week.  I will also monitor his magnesium level as this was low last time.  Encourage the patient and his wife to monitor his mental status and his bowel movement frequency.  We need to shoot for at least 3 bowel movements a day and then days his lactulose dose on any evidence of confusion or constipation.  I also spent a tremendous amount of time explaining a low carbohydrate low saturated fat diet.  This is a means to help control his blood sugars better and also avoid worsening of his fatty liver disease.  At the present time, I feel his liver disease is so precarious that have asked him to stay away from the Lipitor.  If we are able to maintain a steady state, we may reconsider this in the future given his previous medical problems cardiovascular disease etc. however at the present time I do not believe his liver can tolerate any additional toxin.  Check in 1 week

## 2018-02-24 LAB — COMPLETE METABOLIC PANEL WITH GFR
AG Ratio: 0.6 (calc) — ABNORMAL LOW (ref 1.0–2.5)
ALBUMIN MSPROF: 2.4 g/dL — AB (ref 3.6–5.1)
ALKALINE PHOSPHATASE (APISO): 202 U/L — AB (ref 40–115)
ALT: 33 U/L (ref 9–46)
AST: 54 U/L — ABNORMAL HIGH (ref 10–35)
BUN / CREAT RATIO: 14 (calc) (ref 6–22)
BUN: 10 mg/dL (ref 7–25)
CO2: 24 mmol/L (ref 20–32)
CREATININE: 0.69 mg/dL — AB (ref 0.70–1.25)
Calcium: 7.8 mg/dL — ABNORMAL LOW (ref 8.6–10.3)
Chloride: 105 mmol/L (ref 98–110)
GFR, EST NON AFRICAN AMERICAN: 102 mL/min/{1.73_m2} (ref >=60)
GFR, Est African American: 119 mL/min/{1.73_m2} (ref >=60)
GLOBULIN: 4.1 g/dL — AB (ref 1.9–3.7)
Glucose, Bld: 170 mg/dL — ABNORMAL HIGH (ref 65–99)
Potassium: 3.4 mmol/L — ABNORMAL LOW (ref 3.5–5.3)
SODIUM: 137 mmol/L (ref 135–146)
Total Bilirubin: 3.2 mg/dL — ABNORMAL HIGH (ref 0.2–1.2)
Total Protein: 6.5 g/dL (ref 6.1–8.1)

## 2018-02-24 LAB — MAGNESIUM: Magnesium: 1.3 mg/dL — ABNORMAL LOW (ref 1.5–2.5)

## 2018-02-26 ENCOUNTER — Other Ambulatory Visit: Payer: Self-pay | Admitting: Family Medicine

## 2018-02-26 MED ORDER — MAGNESIUM OXIDE 400 MG PO TABS: 400.0000 mg | ORAL_TABLET | Freq: Every day | ORAL | 1 refills | Status: DC

## 2018-02-26 MED ORDER — POTASSIUM CHLORIDE ER 10 MEQ PO TBCR: 10.0000 meq | EXTENDED_RELEASE_TABLET | Freq: Every day | ORAL | 1 refills | Status: DC

## 2018-02-26 MED FILL — levoFLOXacin 500 MG TABS: 500 | 30 days supply | Qty: 30 | Fill #7

## 2018-03-02 ENCOUNTER — Encounter: Payer: Self-pay | Admitting: Family Medicine

## 2018-03-02 ENCOUNTER — Ambulatory Visit (INDEPENDENT_AMBULATORY_CARE_PROVIDER_SITE_OTHER): Payer: PPO | Admitting: Family Medicine

## 2018-03-02 VITALS — BP 120/62 | HR 93 | Temp 97.7°F | Resp 18 | Ht 66.0 in | Wt 248.0 lb

## 2018-03-02 DIAGNOSIS — K7460 Unspecified cirrhosis of liver: Secondary | ICD-10-CM

## 2018-03-02 DIAGNOSIS — K7581 Nonalcoholic steatohepatitis (NASH): Secondary | ICD-10-CM

## 2018-03-02 MED ORDER — MAGNESIUM OXIDE 400 MG PO TABS: 400.0000 mg | ORAL_TABLET | Freq: Every day | ORAL | 1 refills | Status: AC

## 2018-03-02 MED ORDER — PROPRANOLOL HCL 20 MG PO TABS: 20.0000 mg | ORAL_TABLET | Freq: Two times a day (BID) | ORAL | 3 refills | Status: DC

## 2018-03-02 MED ORDER — POTASSIUM CHLORIDE ER 10 MEQ PO TBCR: 10.0000 meq | EXTENDED_RELEASE_TABLET | Freq: Every day | ORAL | 1 refills | Status: DC

## 2018-03-02 NOTE — Progress Notes (Signed)
Subjective:    Patient ID: Adam Butler, male    DOB: 1956/03/04, 61 y.o.   MRN: [id number]  HPI 02/16/18 Patient is transferring care to me from my partner due to medical complexity.  I have copied relevant portions of her recent office visit for my reference:  Adam Butler is a 62 y.o. male, presents to clinic with CC of hospital followup after admission 01/25/18-01/27/18 for hepatic encephalopathy, cirrhosis secondary to NASH, IDDM with hyperglycemia and hypokalemia.  Pt was fluid overloaded ~ 30 lbs at 270 lbs (lowest recent weight near my first visit with this pt was 240 lbs), altered and tachypneic.  He has his paperwork with him with several medication changes that he has not been compliant with.   Lactulose 30 mg TID, pt states he is taking 10 mg lactulose once nightly.  Pt states that works better for his BM's and it is allowing him to get some sleep and he is less sleepy throughout the daytime.  His insulin was also changed.  His was instructed to do lantus 10 units at night and Novolin R sliding scale TID. He does not want to take lantus, saying it was too expensive in the past, and he has been doing novolin R for years and it is affordable.  He reports morning sugars 122-124 in the am.  A recent low was 80 didn't feel well, sweaty and hungry, 180 highest after eating more at night.   He was discharged from the hospital with decreased spironolactone and increased lasix dose.  He is breathing good, is walking a lot, feeling better.  He was "feeling drunk" and sleeping throughout the day and now he is sleeping better at night and awake during the day more.  But he does not feel like he's not getting enough fluid off his legs despite taking the medicines.  He still has severely swollen feet, ankles and legs, and his abdomen does also feel enlarged with swelling in abdominal skin as well.  He wants to wear compression stockings but is having difficulty getting them on.    GI/Dr. Hilarie Fredrickson -  pt states he saw GI while in the hospital, he has spoken to their office, appointment pending and f/up LUQ [place] pending.   02/16/18 Patient had an EGD performed 01/02/2018 which revealed Candida esophagitis as well as small esophageal varices.  He is not currently taking propranolol.  He is on Levaquin for prevention of spontaneous bacterial peritonitis.  I see no evidence that he is followed up with GI as planned.  His potassium levels remain borderline low.  Magnesium levels remain low.  Patient states he is currently taking lactulose once a day but this is allowing him 5-6 bowel movements a day.  He is taking 40 mg of Lasix once a day and only 25 mg of Spironolactone once a day.  He has +2 pitting edema in both legs.  His weight is still 11 pounds heavier than his baseline when he established with [place].  There are faint bibasilar crackles and obvious ascites on his exam.  He appears fluid overloaded.  At that time, my plan was:  We need to optimize the management of his cirrhosis.  First and foremost I will schedule the patient to follow-up with Dr. Hilarie Fredrickson.  I have recommended that he follow-up with Dr. Hilarie Fredrickson regularly most likely every 3 to 4 months until stable.  Second we need to address his fluid overload.  I would like to get the patient  ultimately on a 100 mg a day of spironolactone if he can tolerate this.  Therefore he will continue Lasix 40 mg a day in the morning.  We will add spironolactone 50 mg p.o. twice daily.  He can discontinue his potassium supplement.  Reassess the patient in 1 week to monitor for evidence of dehydration or hypotension or hyperkalemia.  Next we need to address his esophageal varices to prevent future upper GI bleeds.  If his blood pressure can tolerate the increased dose of spironolactone in 1 week I plan to start the patient on propranolol 20 mg p.o. twice daily.  He will continue Levaquin for SBP prophylaxis.  I have encouraged the patient to titrate his lactulose to achieve  5-6 bowel movements per day.  If he is truly accomplishing this with 1 dose of lactulose then I would not make any changes at the present time.  However I encouraged his wife to monitor the patient and if he is not going to the bathroom regularly she will need to increase the lactulose to 3 times daily.  Patient is currently doing sliding scale insulin.  He is not taking Lantus.  I have asked him to record his pre-meal blood sugars with breakfast lunch and dinner as well as his evening blood sugar and return in 1 week for me to review.  I believe he would do better with basal insulin however I will review his sugars and see how well he is controlling his sugars with his Novolin R.  02/23/18 Wt Readings from Last 3 Encounters:  03/02/18 248 lb (112.5 kg)  02/23/18 257 lb (116.6 kg)  02/16/18 262 lb (118.8 kg)   Patient has lost 5 lbs over the last week.  He is approaching his baseline weight.  Patient has an appointment to see the gastroenterologist in October.  While his swelling is better, he is still swollen significantly in his left leg +2, and +1 in his right leg.  His lungs sound better today.  His wife states that once this week, he was demonstrating asterixis and some confusion however she was able to modify his lactulose and avoid taking him to the hospital.  I emphasized that he needs to have 3 bowel movements a day and that we may need to increase his lactulose accordingly to achieve this particularly if he is confused or disoriented.  Today he appears to be at his baseline.  He presents with fasting sugars.  Typically his fasting sugars are 1 40-1 88.  His sugars later in the day typically are between 203 100.  He is unable to tolerate/afford Lantus or basal insulin.  He is using only regular insulin.  We discussed for more than 25 minutes a day his diet.  He is eating fried foods, fast foods, high carbohydrate diet.  He refuses to eat vegetables.  This is based on taste preferences.  However the  patient did not realize that his cirrhosis was due to fatty liver disease.  I explained to him that fast food fried foods high carbohydrate diet contributes this and that this type of diet will expedite medical problems.  Patient states that he was unaware of this.  I also explained to the patient that a high carbohydrate diet exacerbates his blood sugar control.  His blood pressure today however is a little too low I think to add the propranolol for his esophageal varices.  At that time, my plan was: Patient's fluid overload has improved but still not at his  baseline.  Continue spironolactone 50 mg p.o. twice daily and Lasix 40 mg a day.  Recheck a CMP to monitor his renal function and his potassium.  Patient does not appear clinically dehydrated.  He still appears to be fluid overloaded.  Therefore I will continue this dose diuretic and recheck the patient in 1 week.  I will also monitor his magnesium level as this was low last time.  Encourage the patient and his wife to monitor his mental status and his bowel movement frequency.  We need to shoot for at least 3 bowel movements a day and then increase his lactulose dose on any evidence of confusion or constipation.  I also spent a tremendous amount of time explaining a low carbohydrate low saturated fat diet.  This is a means to help control his blood sugars better and also avoid worsening of his fatty liver disease.  At the present time, I feel his liver disease is so precarious that have asked him to stay away from the Lipitor.  If we are able to maintain a steady state, we may reconsider this in the future given his previous medical problems cardiovascular disease etc. however at the present time I do not believe his liver can tolerate any additional toxin.  Check in 1 week  03/02/18 Wt Readings from Last 3 Encounters:  03/02/18 248 lb (112.5 kg)  02/23/18 257 lb (116.6 kg)  02/16/18 262 lb (118.8 kg)   Patient lost 9 pounds since last week.  I am  concerned that he may be approaching dehydration.  He denies any dizziness or lightheadedness.  In fact he feels much better now.  He states that his shortness of breath has improved and is becoming more active.  His blood pressure today is normal at 120/62.  He denies any fever or abdominal pain.  He denies any melena or hematochezia.  He denies any confusion.  He denies any asterixis Past Medical History:  Diagnosis Date  . Arthritis   . Cholelithiasis   . Cirrhosis ([place]) 2011   Cryptogenic, Likely NASH. Family/pt deny EtOH. HCV, HBV, HAV negative. ANA negative. AMA positive. Ascites 12/11  . Coronary artery disease    Inferior MI 12/11; LHC with occluded mid CFX and 80% proximal RCA. EF 55%. He had 3.0 x 28 vision BMS to CFX  . Diastolic CHF, acute ([place])    Echo 12/11 with ef 50-55% and mild LVH. EF 55% by LV0gram in 12/11  . Esophageal varices ([place]) 2011, 2013   no hx acute variceal bleed  . Hepatic encephalopathy ([place]) 2011, 12/2013  . Hyperlipemia   . Myocardial infarction [facility]) ? 2012  . PFO (patent foramen ovale): Per TEE 04/20/2015 04/20/2015  . Portal hypertension ([place])   . Rectal varices   . S/P coronary artery stent placement 06/2010  . SVT (supraventricular tachycardia) ([place])    1/12: appeared to be an ectopic atrial tachycardia. Required DCCV with hemodynamic instability  . Type II diabetes mellitus ([place]) 2011   Past Surgical History:  Procedure Laterality Date  . APPENDECTOMY    . BIOPSY  01/02/2018   Procedure: BIOPSY;  Surgeon: Jerene Bears, MD;  Location: [facility];  Service: Gastroenterology;;  . CARDIAC CATHETERIZATION  07/01/2010   BMS to CFX.  Marland Kitchen CATARACT EXTRACTION W/PHACO Left 04/28/2016   Procedure: CATARACT EXTRACTION PHACO AND INTRAOCULAR LENS PLACEMENT (IOC);  Surgeon: Leandrew Koyanagi, MD;  Location: [facility] ORS;  Service: Ophthalmology;  Laterality: Left;  Lot# [id number] H [place]: 05:31.7 AP%: 28.2  CDE: 93.48   . COLONOSCOPY  04/13/2012   Procedure:  COLONOSCOPY;  Surgeon: Inda Castle, MD;  Location: [facility];  Service: Endoscopy;  Laterality: N/A;  . COLONOSCOPY WITH PROPOFOL N/A 03/22/2017   Procedure: COLONOSCOPY WITH PROPOFOL;  Surgeon: Doran Stabler, MD;  Location: [facility];  Service: Gastroenterology;  Laterality: N/A;  . COLONOSCOPY WITH PROPOFOL N/A 01/02/2018   Procedure: COLONOSCOPY WITH PROPOFOL;  Surgeon: Jerene Bears, MD;  Location: [facility];  Service: Gastroenterology;  Laterality: N/A;  . CORONARY ANGIOPLASTY    . CORONARY STENT PLACEMENT  06/30/2010   CFX   Distal        . ESOPHAGEAL BANDING N/A 01/01/2014   Procedure: ESOPHAGEAL BANDING;  Surgeon: Jerene Bears, MD;  Location: [place] [facility];  Service: Endoscopy;  Laterality: N/A;  . ESOPHAGOGASTRODUODENOSCOPY  04/13/2012   Procedure: ESOPHAGOGASTRODUODENOSCOPY (EGD);  Surgeon: Inda Castle, MD;  Location: [facility];  Service: Endoscopy;  Laterality: N/A;  . ESOPHAGOGASTRODUODENOSCOPY N/A 01/01/2014   Procedure: ESOPHAGOGASTRODUODENOSCOPY (EGD);  Surgeon: Jerene Bears, MD;  Location: [facility] ENDOSCOPY;  Service: Endoscopy;  Laterality: N/A;  . ESOPHAGOGASTRODUODENOSCOPY (EGD) WITH PROPOFOL N/A 07/12/2016   Procedure: ESOPHAGOGASTRODUODENOSCOPY (EGD) WITH PROPOFOL;  Surgeon: Jerene Bears, MD;  Location: [facility];  Service: Gastroenterology;  Laterality: N/A;  . ESOPHAGOGASTRODUODENOSCOPY (EGD) WITH PROPOFOL N/A 01/02/2018   Procedure: ESOPHAGOGASTRODUODENOSCOPY (EGD) WITH PROPOFOL;  Surgeon: Jerene Bears, MD;  Location: [facility];  Service: Gastroenterology;  Laterality: N/A;  . LEFT HEART CATHETERIZATION WITH CORONARY ANGIOGRAM N/A 10/15/2014   Procedure: LEFT HEART CATHETERIZATION WITH CORONARY ANGIOGRAM;  Surgeon: Peter M Martinique, MD;  Location: [facility] CATH LAB;  Service: Cardiovascular;  Laterality: N/A;  . orif r leg    . POLYPECTOMY  01/02/2018   Procedure: POLYPECTOMY;  Surgeon: Jerene Bears, MD;  Location: [facility];  Service: Gastroenterology;;  .  TEE WITHOUT CARDIOVERSION N/A 04/20/2015   Procedure: TRANSESOPHAGEAL ECHOCARDIOGRAM (TEE);  Surgeon: Fay Records, MD;  Location: [place];  Service: Cardiovascular;  Laterality: N/A;  . TEE WITHOUT CARDIOVERSION N/A 05/17/2017   Procedure: TRANSESOPHAGEAL ECHOCARDIOGRAM (TEE);  Surgeon: Josue Hector, MD;  Location: [facility] ENDOSCOPY;  Service: Cardiovascular;  Laterality: N/A;   Current Outpatient Medications on File Prior to Visit  Medication Sig Dispense Refill  . cyanocobalamin 1000 MCG tablet Take 1,000 mcg by mouth daily. Vitamin B12    . furosemide (LASIX) 20 MG tablet Take 1 tablet (20 mg total) by mouth daily. (Patient taking differently: Take 40 mg by mouth daily. ) 90 tablet 1  . insulin regular (NOVOLIN R,HUMULIN R) 100 units/mL injection Inject 2-8 Units into the skin 3 (three) times daily as needed for high blood sugar (CBG >150).     . INSULIN SYRINGE 1CC/29G (SAFETY INSULIN SYRINGES) 29G X 1/2" 1 ML MISC 1 application by Does not apply route daily. 30 each 0  . lactulose (CHRONULAC) 10 GM/15ML solution Take 45 mLs (30 g total) by mouth 3 (three) times daily. 1892 mL 3  . levofloxacin (LEVAQUIN) 500 MG tablet Take 500 mg by mouth daily.     . magnesium oxide (MAG-OX) 400 MG tablet Take 1 tablet (400 mg total) by mouth daily. 90 tablet 1  . Multiple Vitamin (MULTIVITAMIN WITH MINERALS) TABS tablet Take 1 tablet by mouth daily.    . nitroGLYCERIN (NITROSTAT) 0.4 MG SL tablet Place 1 tablet (0.4 mg total) under the tongue every 5 (five) minutes as needed for chest pain (chest pain). 25 tablet 5  .  potassium chloride (K-DUR) 10 MEQ tablet Take 1 tablet (10 mEq total) by mouth daily. 90 tablet 1  . ranitidine (ZANTAC) 150 MG tablet Take 150 mg by mouth 2 (two) times daily as needed for heartburn.     . spironolactone (ALDACTONE) 50 MG tablet Take 1 tablet (50 mg total) by mouth 2 (two) times daily. 60 tablet 1  . lactulose, encephalopathy, (CHRONULAC) 10 GM/15ML SOLN TAKE 30 ML AT  BEDTIME  1   No current facility-administered medications on file prior to visit.    Allergies  Allergen Reactions  . Isosorbide Nitrate Other (See Comments)    Nose bleeds   Social History   Socioeconomic History  . Marital status: Married    Spouse name: Not on file  . Number of children: 3  . Years of education: Not on file  . Highest education level: Not on file  Occupational History  . Occupation: Merchandiser, retail: OTHER    Comment: Worked in Theatre manager prior  Social Needs  . Financial resource strain: Not on file  . Food insecurity:    Worry: Not on file    Inability: Not on file  . Transportation needs:    Medical: Not on file    Non-medical: Not on file  Tobacco Use  . Smoking status: Current Every Day Smoker    Packs/day: 0.25    Years: 36.00    Pack years: 9.00    Types: E-cigarettes, Cigarettes  . Smokeless tobacco: Never Used  . Tobacco comment: uses vapor cigarettes (2016 ); I smoke about 4 cigarettes a day  Substance and Sexual Activity  . Alcohol use: No    Alcohol/week: 0.0 standard drinks  . Drug use: No  . Sexual activity: Never  Lifestyle  . Physical activity:    Days per week: Not on file    Minutes per session: Not on file  . Stress: Not on file  Relationships  . Social connections:    Talks on phone: Not on file    Gets together: Not on file    Attends religious service: Not on file    Active member of club or organization: Not on file    Attends meetings of clubs or organizations: Not on file    Relationship status: Not on file  . Intimate partner violence:    Fear of current or ex partner: Not on file    Emotionally abused: Not on file    Physically abused: Not on file    Forced sexual activity: Not on file  Other Topics Concern  . Not on file  Social History Narrative   Married   Gets regular exercise: walking    Review of Systems  All other systems reviewed and are negative.      Objective:   Physical Exam    Constitutional: He appears well-developed and well-nourished. No distress.  Neck: No JVD present.  Cardiovascular: Normal rate and regular rhythm.  Murmur heard. Pulmonary/Chest: No respiratory distress. He has no rales.  Abdominal: Soft. Bowel sounds are normal. He exhibits distension. He exhibits no mass. There is no tenderness. There is no rebound and no guarding.  Musculoskeletal: He exhibits edema.  Skin: He is not diaphoretic.  Vitals reviewed.         Assessment & Plan:  Liver cirrhosis secondary to NASH ([place]) - Plan: CBC with Differential/Platelet, COMPLETE METABOLIC PANEL WITH GFR, Magnesium  Hypomagnesemia - Plan: Magnesium  I believe we need to decrease his dose of  diuretic to avoid dehydration.  Continue Lasix 40 mg a day but decrease spironolactone to 25 mg twice a day.  Monitor his weight daily and try to maintain a stable weight consistent with today's weight of 248 pounds.  I explained this in detail to his wife and she will check his weight daily and notify us immediately if he gains or loses more than 2 pounds in a day from this baseline.  We will add propranolol 20 mg p.o. twice daily to prevent bleeding esophageal varices.  He has an appointment scheduled to see his gastroenterologist later this fall.  I recommended a flu shot when they become available.  Recheck his magnesium and potassium however he has not been taking the magnesium I recommended after his last lab draw where his magnesium was found to be low at 1.3

## 2018-03-03 LAB — COMPLETE METABOLIC PANEL WITH GFR
AG Ratio: 0.6 (calc) — ABNORMAL LOW (ref 1.0–2.5)
ALBUMIN MSPROF: 2.6 g/dL — AB (ref 3.6–5.1)
ALKALINE PHOSPHATASE (APISO): 190 U/L — AB (ref 40–115)
ALT: 35 U/L (ref 9–46)
AST: 60 U/L — AB (ref 10–35)
BUN: 13 mg/dL (ref 7–25)
CO2: 23 mmol/L (ref 20–32)
CREATININE: 0.74 mg/dL (ref 0.70–1.25)
Calcium: 8.3 mg/dL — ABNORMAL LOW (ref 8.6–10.3)
Chloride: 107 mmol/L (ref 98–110)
GFR, Est African American: 115 mL/min/{1.73_m2} (ref >=60)
GFR, Est Non African American: 100 mL/min/{1.73_m2} (ref >=60)
GLOBULIN: 4.3 g/dL — AB (ref 1.9–3.7)
GLUCOSE: 147 mg/dL — AB (ref 65–99)
Potassium: 3.4 mmol/L — ABNORMAL LOW (ref 3.5–5.3)
SODIUM: 139 mmol/L (ref 135–146)
Total Bilirubin: 2.8 mg/dL — ABNORMAL HIGH (ref 0.2–1.2)
Total Protein: 6.9 g/dL (ref 6.1–8.1)

## 2018-03-03 LAB — CBC WITH DIFFERENTIAL/PLATELET
BASOS PCT: 0.9 %
Basophils Absolute: 50 cells/uL (ref 0–200)
Eosinophils Absolute: 302 cells/uL (ref 15–500)
Eosinophils Relative: 5.4 %
HCT: 35.8 % — ABNORMAL LOW (ref 38.5–50.0)
Hemoglobin: 12.5 g/dL — ABNORMAL LOW (ref 13.2–17.1)
Lymphs Abs: 1002 cells/uL (ref 850–3900)
MCH: 34.8 pg — ABNORMAL HIGH (ref 27.0–33.0)
MCHC: 34.9 g/dL (ref 32.0–36.0)
MCV: 99.7 fL (ref 80.0–100.0)
MONOS PCT: 11.5 %
MPV: 12.2 fL (ref 7.5–12.5)
NEUTROS ABS: 3601 {cells}/uL (ref 1500–7800)
Neutrophils Relative %: 64.3 %
PLATELETS: 52 10*3/uL — AB (ref 140–400)
RBC: 3.59 10*6/uL — ABNORMAL LOW (ref 4.20–5.80)
RDW: 13.9 % (ref 11.0–15.0)
Total Lymphocyte: 17.9 %
WBC: 5.6 10*3/uL (ref 3.8–10.8)
WBCMIX: 644 {cells}/uL (ref 200–950)

## 2018-03-03 LAB — MAGNESIUM: MAGNESIUM: 1.4 mg/dL — AB (ref 1.5–2.5)

## 2018-03-21 ENCOUNTER — Other Ambulatory Visit: Payer: Self-pay

## 2018-03-21 NOTE — Patient Outreach (Signed)
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 HIPAA verified.  The patient states that he has been doing ok.  The patient states that he has been monitoring his blood sugars more closely.  He states that his blood sugar this morning was 148.  He reports that he eats what he wants.  I talked to him about food exchange and moderation.  The patient verbalized understanding.  He states that he has some swelling in his feet.  He has been keeping his feet elevated.  He wears his diabetic socks and his compression socks cuts the circulation in his legs. He denies any pain or falls.  He states that he is taking his medications as prescribed.  He has a follow up appointment with his primary care the 24 th of this month. The patient was encouraged to get his flu vaccine at his next visit.  Current Medications:  Current Outpatient Medications  Medication Sig Dispense Refill  . cyanocobalamin 1000 MCG tablet Take 1,000 mcg by mouth daily. Vitamin B12    . furosemide (LASIX) 20 MG tablet Take 1 tablet (20 mg total) by mouth daily. (Patient taking differently: Take 40 mg by mouth daily. ) 90 tablet 1  . insulin regular (NOVOLIN R,HUMULIN R) 100 units/mL injection Inject 2-8 Units into the skin 3 (three) times daily as needed for high blood sugar (CBG >150).     . INSULIN SYRINGE 1CC/29G (SAFETY INSULIN SYRINGES) 29G X 1/2" 1 ML MISC 1 application by Does not apply route daily. 30 each 0  . lactulose (CHRONULAC) 10 GM/15ML solution Take 45 mLs (30 g total) by mouth 3 (three) times daily. 1892 mL 3  . levofloxacin (LEVAQUIN) 500 MG tablet Take 500 mg by mouth daily.     . magnesium oxide (MAG-OX) 400 MG tablet Take 1 tablet (400 mg total) by mouth daily. 90 tablet 1  . Multiple Vitamin (MULTIVITAMIN WITH MINERALS) TABS tablet Take 1 tablet by mouth daily.    . nitroGLYCERIN (NITROSTAT) 0.4 MG SL  tablet Place 1 tablet (0.4 mg total) under the tongue every 5 (five) minutes as needed for chest pain (chest pain). 25 tablet 5  . potassium chloride (K-DUR) 10 MEQ tablet Take 1 tablet (10 mEq total) by mouth daily. 90 tablet 1  . propranolol (INDERAL) 20 MG tablet Take 1 tablet (20 mg total) by mouth 2 (two) times daily. 60 tablet 3  . ranitidine (ZANTAC) 150 MG tablet Take 150 mg by mouth 2 (two) times daily as needed for heartburn.     . spironolactone (ALDACTONE) 50 MG tablet Take 1 tablet (50 mg total) by mouth 2 (two) times daily. 60 tablet 1  . lactulose, encephalopathy, (CHRONULAC) 10 GM/15ML SOLN TAKE 30 ML AT BEDTIME  1   No current facility-administered medications for this visit.     Functional Status:  In your present state of health, do you have any difficulty performing the following activities: 02/14/2018 01/25/2018  Hearing? N N  Vision? N N  Difficulty concentrating or making decisions? Y N  Comment - -  Walking or climbing stairs? Y Y  Dressing or bathing? N N  Doing errands, shopping? N N  Some recent data might be hidden    Fall/Depression Screening: Fall Risk  03/21/2018 02/14/2018 01/31/2018  Falls in the past year? No Yes Yes  Comment - - -  Number falls in past yr: -  1 2 or more  Injury with Fall? - No Yes  Risk Factor Category  - - -  Risk for fall due to : - Impaired balance/gait -  Risk for fall due to: Comment - - -  Follow up - Education provided;Falls prevention discussed Education provided;Falls prevention discussed   PHQ 2/9 Scores 02/14/2018 01/31/2018 12/22/2017 06/13/2017 11/17/2016 07/19/2016 01/11/2016  PHQ - 2 Score 1 4 6  0 0 1 0  PHQ- 9 Score - 16 18 - - - -    Assessment: Patient will continue to benefit from health coach outreach for disease management and support.  [facility]N CM Care Plan Problem One     Most Recent Value  [facility]N Long Term Goal   In 90 days the patient will lower his a1c of 8.1 by 1-2 points  [facility] Long Term Goal Start Date  03/21/18   Interventions for Problem One Long Term Goal  Talked with the patient about his food and fuid intake and medication adherence.     Plan: RN Health Coach will contact patient in the month of December and patient agrees to next outreach.  Lazaro Arms RN, BSN, [place] Direct Dial:  [phone]  Fax: [phone]

## 2018-03-29 MED FILL — levoFLOXacin 500 MG TABS: 500 | 30 days supply | Qty: 30 | Fill #8

## 2018-04-03 ENCOUNTER — Ambulatory Visit: Payer: PPO | Admitting: Family Medicine

## 2018-04-17 ENCOUNTER — Encounter: Payer: Self-pay | Admitting: Internal Medicine

## 2018-04-17 ENCOUNTER — Ambulatory Visit (INDEPENDENT_AMBULATORY_CARE_PROVIDER_SITE_OTHER): Payer: PPO | Admitting: Internal Medicine

## 2018-04-17 ENCOUNTER — Other Ambulatory Visit (INDEPENDENT_AMBULATORY_CARE_PROVIDER_SITE_OTHER): Payer: PPO

## 2018-04-17 VITALS — BP 100/60 | HR 64 | Ht 66.0 in | Wt 245.0 lb

## 2018-04-17 DIAGNOSIS — R188 Other ascites: Secondary | ICD-10-CM | POA: Diagnosis not present

## 2018-04-17 DIAGNOSIS — R790 Abnormal level of blood mineral: Secondary | ICD-10-CM

## 2018-04-17 DIAGNOSIS — K219 Gastro-esophageal reflux disease without esophagitis: Secondary | ICD-10-CM

## 2018-04-17 DIAGNOSIS — Z8619 Personal history of other infectious and parasitic diseases: Secondary | ICD-10-CM | POA: Diagnosis not present

## 2018-04-17 DIAGNOSIS — K7460 Unspecified cirrhosis of liver: Secondary | ICD-10-CM

## 2018-04-17 DIAGNOSIS — I8510 Secondary esophageal varices without bleeding: Secondary | ICD-10-CM | POA: Diagnosis not present

## 2018-04-17 DIAGNOSIS — K529 Noninfective gastroenteritis and colitis, unspecified: Secondary | ICD-10-CM | POA: Diagnosis not present

## 2018-04-17 DIAGNOSIS — Z86010 Personal history of colonic polyps: Secondary | ICD-10-CM

## 2018-04-17 DIAGNOSIS — K7682 Hepatic encephalopathy: Secondary | ICD-10-CM

## 2018-04-17 DIAGNOSIS — K766 Portal hypertension: Secondary | ICD-10-CM | POA: Diagnosis not present

## 2018-04-17 DIAGNOSIS — K7290 Hepatic failure, unspecified without coma: Secondary | ICD-10-CM | POA: Diagnosis not present

## 2018-04-17 DIAGNOSIS — K7581 Nonalcoholic steatohepatitis (NASH): Secondary | ICD-10-CM | POA: Diagnosis not present

## 2018-04-17 LAB — COMPREHENSIVE METABOLIC PANEL
ALK PHOS: 195 U/L — AB (ref 39–117)
ALT: 34 U/L (ref 0–53)
AST: 62 U/L — AB (ref 0–37)
Albumin: 2.5 g/dL — ABNORMAL LOW (ref 3.5–5.2)
BILIRUBIN TOTAL: 2.5 mg/dL — AB (ref 0.2–1.2)
BUN: 11 mg/dL (ref 6–23)
CALCIUM: 8 mg/dL — AB (ref 8.4–10.5)
CHLORIDE: 104 meq/L (ref 96–112)
CO2: 25 meq/L (ref 19–32)
CREATININE: 0.84 mg/dL (ref 0.40–1.50)
GFR: 98.37 mL/min (ref >60.00)
Glucose, Bld: 252 mg/dL — ABNORMAL HIGH (ref 70–99)
POTASSIUM: 4 meq/L (ref 3.5–5.1)
Sodium: 134 mEq/L — ABNORMAL LOW (ref 135–145)
Total Protein: 6.8 g/dL (ref 6.0–8.3)

## 2018-04-17 LAB — PROTIME-INR
INR: 1.6 ratio — AB (ref 0.8–1.0)
Prothrombin Time: 18.9 s — ABNORMAL HIGH (ref 9.6–13.1)

## 2018-04-17 LAB — CBC WITH DIFFERENTIAL/PLATELET
BASOS PCT: 0.9 % (ref 0.0–3.0)
Basophils Absolute: 0.1 10*3/uL (ref 0.0–0.1)
EOS ABS: 0.3 10*3/uL (ref 0.0–0.7)
Eosinophils Relative: 4.9 % (ref 0.0–5.0)
HCT: 35.3 % — ABNORMAL LOW (ref 39.0–52.0)
HEMOGLOBIN: 12.1 g/dL — AB (ref 13.0–17.0)
Lymphocytes Relative: 17.2 % (ref 12.0–46.0)
Lymphs Abs: 0.9 10*3/uL (ref 0.7–4.0)
MCHC: 34.2 g/dL (ref 30.0–36.0)
MCV: 105.1 fl — ABNORMAL HIGH (ref 78.0–100.0)
MONO ABS: 0.7 10*3/uL (ref 0.1–1.0)
Monocytes Relative: 13.2 % — ABNORMAL HIGH (ref 3.0–12.0)
NEUTROS ABS: 3.4 10*3/uL (ref 1.4–7.7)
NEUTROS PCT: 63.8 % (ref 43.0–77.0)
PLATELETS: 51 10*3/uL — AB (ref 150.0–400.0)
RBC: 3.36 Mil/uL — ABNORMAL LOW (ref 4.22–5.81)
RDW: 15.7 % — ABNORMAL HIGH (ref 11.5–15.5)
WBC: 5.4 10*3/uL (ref 4.0–10.5)

## 2018-04-17 LAB — MAGNESIUM: MAGNESIUM: 1.4 mg/dL — AB (ref 1.5–2.5)

## 2018-04-17 NOTE — Patient Instructions (Signed)
Your provider has requested that you go to the basement level for lab work before leaving today. Press "B" on the elevator. The lab is located at the first door on the left as you exit the elevator.  Please follow up with Dr Hilarie Fredrickson in 3-4 months.  Continue lasix (furosemide) 20 mg daily.  Continue aldactone 50 mg daily.  Should you notice an increase in your swelling, you may increase your aldactone and lasix x several days, however, you should keep these at the same intervals as they currently are (I.e, lasix 40 mg, aldactone 100 mg)  Follow a low sodium diet (less than 2 grams daily).  Continue Magnesium daily.  Continue Levaquin.  Continue Zantac.  Continue propranolol.  If you are age 21 or older, your body mass index should be between 23-30. Your Body mass index is 39.54 kg/m. If this is out of the aforementioned range listed, please consider follow up with your Primary Care Provider.  If you are age 66 or younger, your body mass index should be between 19-25. Your Body mass index is 39.54 kg/m. If this is out of the aformentioned range listed, please consider follow up with your Primary Care Provider.

## 2018-04-17 NOTE — Progress Notes (Signed)
Subjective:    Patient ID: Adam Butler, male    DOB: 02/23/1956, 62 y.o.   MRN: [id number]  HPI Adam Butler is a 62 year old male with a history of NASH cirrhosis with portal hypertension manifested by ascites, esophageal varices, hepatic encephalopathy, history of SBP, history of colon polyps, nonspecific colitis who is here for follow-up.  He is here today with his wife.  He was last seen in the office in January 2019 and for upper endoscopy and colonoscopy performed in June 2019.  Since being seen here he was seen by primary care, Dr. Dennard Schaumann, and was noted to be considerably volume overloaded at that time.  His diuretics were titrated and he was started on propranolol twice daily for history of esophageal varices.  Today he reports that he is feeling better.  He has lost considerable weight with diuretic titration.  His abdominal swelling has improved as has his lower extremity edema.  He is currently taking Lasix 20 mg and spironolactone 50 mg daily.  His wife notes that if he gains several pounds quickly or notices increased lower extremity edema he will take spironolactone 100 mg for 1 to 2 days along with the Lasix 20 mg.  He does try to cut back on salt but admits to using some salt because the food that he needs requires it.  He reports a good appetite.  He does report some mid and lower abdominal discomfort from time to time which feels like more of a nausea.  Occasionally he will have more diarrhea than usual.  He is taking lactulose usually once per day and will have 3-10 bowel movements per day.  Stools are nonbloody and non-melenic.  No recent issues with confusion or altered mental status per patient and wife.  He is taking ranitidine 150 mg twice daily, propranolol 20 mg twice daily, magnesium supplementation as his magnesium was found to be low by primary care.  He is also taking potassium supplement 10 mEq once daily.  He takes Levaquin 500 mg daily for SBP  prophylaxis.   Review of Systems  As per HPI, otherwise negative  Current Medications, Allergies, Past Medical History, Past Surgical History, Family History and Social History were reviewed in [employer] record.     Objective:   Physical Exam BP 100/60   Pulse 64   Ht 5' 6"  (1.676 m)   Wt 245 lb (111.1 kg)   BMI 39.54 kg/m  Constitutional: Well-developed and well-nourished. No distress. HEENT: Normocephalic and atraumatic.  Conjunctivae are normal.  No scleral icterus. Neck: Neck supple. Trachea midline. Cardiovascular: Normal rate, regular rhythm and intact distal pulses. No M/R/G Pulmonary/chest: Effort normal and breath sounds normal. No wheezing, rales or rhonchi. Abdominal: Soft, obese, nontender, nondistended without definitive ascites. Bowel sounds active throughout.  Extremities: no clubbing, cyanosis, trace pretibial edema bilateral Neurological: Alert and oriented to person place and time.  No asterixis Skin: Skin is warm and dry. Psychiatric: Normal mood and affect. Behavior is normal.  CBC    Component Value Date/Time   WBC 5.6 03/02/2018 0938   RBC 3.59 (L) 03/02/2018 0938   HGB 12.5 (L) 03/02/2018 0938   HCT 35.8 (L) 03/02/2018 0938   PLT 52 (L) 03/02/2018 0938   MCV 99.7 03/02/2018 0938   MCH 34.8 (H) 03/02/2018 0938   MCHC 34.9 03/02/2018 0938   RDW 13.9 03/02/2018 0938   LYMPHSABS 1,002 03/02/2018 0938   MONOABS 0.7 11/02/2017 0420   EOSABS 302 03/02/2018 5697  BASOSABS 50 03/02/2018 0938   CMP     Component Value Date/Time   NA 139 03/02/2018 0938   K 3.4 (L) 03/02/2018 0938   CL 107 03/02/2018 0938   CO2 23 03/02/2018 0938   GLUCOSE 147 (H) 03/02/2018 0938   BUN 13 03/02/2018 0938   CREATININE 0.74 03/02/2018 0938   CALCIUM 8.3 (L) 03/02/2018 0938   PROT 6.9 03/02/2018 0938   ALBUMIN 1.9 (L) 01/26/2018 0542   AST 60 (H) 03/02/2018 0938   ALT 35 03/02/2018 0938   ALKPHOS 157 (H) 01/26/2018 0542   BILITOT 2.8 (H)  03/02/2018 0938   GFRNONAA 100 03/02/2018 0938   GFRAA 115 03/02/2018 0938   Lab Results  Component Value Date   INR 1.91 01/25/2018   INR 1.5 (H) 12/22/2017   INR 1.71 11/02/2017       Assessment & Plan:  62 year old male with a history of NASH cirrhosis with portal hypertension manifested by ascites, esophageal varices, hepatic encephalopathy, history of SBP, history of colon polyps, nonspecific colitis who is here for follow-up.  1.  NASH cirrhosis --he had considerable volume overload a couple of months ago which has been treated with diuretic therapy by primary care.  I explained to him that I would like to see him on a more regular basis, at least every 3 to 4 months. --Ascites/volume overload --we discussed his diuretic therapy.  For now we will continue his Lasix 20 mg and Aldactone 50 mg daily.  He can take both of these doses together in the morning.  If he does have more than 5 pound weight gain or noticeable lower extremity edema I advised that he increase Lasix to 40 mg and Aldactone to 100 mg daily for several days until swelling improves.  If it fails to improve he should notify me immediately.  We discussed the importance of low-sodium diet and I asked that he not add salt to any of his food. --History of SBP --he is on Levaquin prophylaxis.  Propranolol has been added back to 20 mg twice daily.  We discussed how beta-blockers are often held in patients with history of SBP but on prophylaxis the risk may outweigh the benefit.  For now we will continue Levaquin 500 mg daily for prophylaxis --Esophageal varices --small at the time of surveillance endoscopy in June 2019; now back on propranolol 20 mg twice daily.  Consider repeat endoscopy in June 2021 unless further decompensation beforehand --Hepatic encephalopathy --continue lactulose at current dose, he is advised to titrate dose to have at least 3-4 soft but formed stools daily. --CBC, CMP and INR today  2.  Loose  stools/abdominal discomfort --he did have a nonspecific colitis by biopsy.  This was not consistent with IBD.  He feels that the symptoms are manageable though I did discuss possible mesalamine therapy for him.  For now we will hold off and he will let me know if the symptoms worsen.  3.  History of strep bovis bacteremia/history of colon polyps --polypectomy at recent colonoscopy, surveillance interval 3 years.  Fortunately he was not found to have colon cancer.  4.  Hypomagnesemia --continue magnesium therapy.  Repeat magnesium level today.  We discussed how magnesium therapy can worsen loose stools.  5.  GERD --he will continue ranitidine 150 mg twice daily  70-monthfollow-up

## 2018-04-18 ENCOUNTER — Other Ambulatory Visit (INDEPENDENT_AMBULATORY_CARE_PROVIDER_SITE_OTHER): Payer: PPO

## 2018-04-18 ENCOUNTER — Other Ambulatory Visit: Payer: Self-pay

## 2018-04-18 DIAGNOSIS — K7460 Unspecified cirrhosis of liver: Secondary | ICD-10-CM

## 2018-04-18 LAB — VITAMIN B12

## 2018-04-18 LAB — FOLATE: Folate: >23.9 ng/mL (ref >5.9)

## 2018-04-27 MED FILL — levoFLOXacin 500 MG TABS: 500 | 30 days supply | Qty: 30 | Fill #9

## 2018-04-30 ENCOUNTER — Encounter: Payer: Self-pay | Admitting: Family Medicine

## 2018-04-30 ENCOUNTER — Ambulatory Visit (INDEPENDENT_AMBULATORY_CARE_PROVIDER_SITE_OTHER): Payer: PPO | Admitting: Family Medicine

## 2018-04-30 VITALS — BP 100/58 | HR 54 | Temp 97.8°F | Resp 18 | Ht 66.0 in | Wt 242.0 lb

## 2018-04-30 DIAGNOSIS — IMO0001 Reserved for inherently not codable concepts without codable children: Secondary | ICD-10-CM

## 2018-04-30 DIAGNOSIS — K7581 Nonalcoholic steatohepatitis (NASH): Secondary | ICD-10-CM | POA: Diagnosis not present

## 2018-04-30 DIAGNOSIS — K7460 Unspecified cirrhosis of liver: Secondary | ICD-10-CM | POA: Diagnosis not present

## 2018-04-30 DIAGNOSIS — E1165 Type 2 diabetes mellitus with hyperglycemia: Secondary | ICD-10-CM

## 2018-04-30 DIAGNOSIS — Z794 Long term (current) use of insulin: Secondary | ICD-10-CM | POA: Diagnosis not present

## 2018-04-30 DIAGNOSIS — Z23 Encounter for immunization: Secondary | ICD-10-CM | POA: Diagnosis not present

## 2018-04-30 DIAGNOSIS — E118 Type 2 diabetes mellitus with unspecified complications: Secondary | ICD-10-CM | POA: Diagnosis not present

## 2018-04-30 NOTE — Progress Notes (Signed)
Subjective:    Patient ID: Adam Butler, male    DOB: 1955-11-14, 62 y.o.   MRN: [id number]  HPI 02/16/18 Patient is transferring care to me from my partner due to medical complexity.  I have copied relevant portions of her recent office visit for my reference:  Adam Butler is a 62 y.o. male, presents to clinic with CC of hospital followup after admission 01/25/18-01/27/18 for hepatic encephalopathy, cirrhosis secondary to NASH, IDDM with hyperglycemia and hypokalemia.  Pt was fluid overloaded ~ 30 lbs at 270 lbs (lowest recent weight near my first visit with this pt was 240 lbs), altered and tachypneic.  He has his paperwork with him with several medication changes that he has not been compliant with.   Lactulose 30 mg TID, pt states he is taking 10 mg lactulose once nightly.  Pt states that works better for his BM's and it is allowing him to get some sleep and he is less sleepy throughout the daytime.  His insulin was also changed.  His was instructed to do lantus 10 units at night and Novolin R sliding scale TID. He does not want to take lantus, saying it was too expensive in the past, and he has been doing novolin R for years and it is affordable.  He reports morning sugars 122-124 in the am.  A recent low was 80 didn't feel well, sweaty and hungry, 180 highest after eating more at night.   He was discharged from the hospital with decreased spironolactone and increased lasix dose.  He is breathing good, is walking a lot, feeling better.  He was "feeling drunk" and sleeping throughout the day and now he is sleeping better at night and awake during the day more.  But he does not feel like he's not getting enough fluid off his legs despite taking the medicines.  He still has severely swollen feet, ankles and legs, and his abdomen does also feel enlarged with swelling in abdominal skin as well.  He wants to wear compression stockings but is having difficulty getting them on.    GI/Dr. Hilarie Fredrickson -  pt states he saw GI while in the hospital, he has spoken to their office, appointment pending and f/up LUQ [place] pending.   02/16/18 Patient had an EGD performed 01/02/2018 which revealed Candida esophagitis as well as small esophageal varices.  He is not currently taking propranolol.  He is on Levaquin for prevention of spontaneous bacterial peritonitis.  I see no evidence that he is followed up with GI as planned.  His potassium levels remain borderline low.  Magnesium levels remain low.  Patient states he is currently taking lactulose once a day but this is allowing him 5-6 bowel movements a day.  He is taking 40 mg of Lasix once a day and only 25 mg of Spironolactone once a day.  He has +2 pitting edema in both legs.  His weight is still 11 pounds heavier than his baseline when he established with [place].  There are faint bibasilar crackles and obvious ascites on his exam.  He appears fluid overloaded.  At that time, my plan was:  We need to optimize the management of his cirrhosis.  First and foremost I will schedule the patient to follow-up with Dr. Hilarie Fredrickson.  I have recommended that he follow-up with Dr. Hilarie Fredrickson regularly most likely every 3 to 4 months until stable.  Second we need to address his fluid overload.  I would like to get the patient  ultimately on a 100 mg a day of spironolactone if he can tolerate this.  Therefore he will continue Lasix 40 mg a day in the morning.  We will add spironolactone 50 mg p.o. twice daily.  He can discontinue his potassium supplement.  Reassess the patient in 1 week to monitor for evidence of dehydration or hypotension or hyperkalemia.  Next we need to address his esophageal varices to prevent future upper GI bleeds.  If his blood pressure can tolerate the increased dose of spironolactone in 1 week I plan to start the patient on propranolol 20 mg p.o. twice daily.  He will continue Levaquin for SBP prophylaxis.  I have encouraged the patient to titrate his lactulose to achieve  5-6 bowel movements per day.  If he is truly accomplishing this with 1 dose of lactulose then I would not make any changes at the present time.  However I encouraged his wife to monitor the patient and if he is not going to the bathroom regularly she will need to increase the lactulose to 3 times daily.  Patient is currently doing sliding scale insulin.  He is not taking Lantus.  I have asked him to record his pre-meal blood sugars with breakfast lunch and dinner as well as his evening blood sugar and return in 1 week for me to review.  I believe he would do better with basal insulin however I will review his sugars and see how well he is controlling his sugars with his Novolin R.  02/23/18 Wt Readings from Last 3 Encounters:  04/30/18 242 lb (109.8 kg)  04/17/18 245 lb (111.1 kg)  03/02/18 248 lb (112.5 kg)   Patient has lost 5 lbs over the last week.  He is approaching his baseline weight.  Patient has an appointment to see the gastroenterologist in October.  While his swelling is better, he is still swollen significantly in his left leg +2, and +1 in his right leg.  His lungs sound better today.  His wife states that once this week, he was demonstrating asterixis and some confusion however she was able to modify his lactulose and avoid taking him to the hospital.  I emphasized that he needs to have 3 bowel movements a day and that we may need to increase his lactulose accordingly to achieve this particularly if he is confused or disoriented.  Today he appears to be at his baseline.  He presents with fasting sugars.  Typically his fasting sugars are 1 40-1 88.  His sugars later in the day typically are between 203 100.  He is unable to tolerate/afford Lantus or basal insulin.  He is using only regular insulin.  We discussed for more than 25 minutes a day his diet.  He is eating fried foods, fast foods, high carbohydrate diet.  He refuses to eat vegetables.  This is based on taste preferences.  However the  patient did not realize that his cirrhosis was due to fatty liver disease.  I explained to him that fast food fried foods high carbohydrate diet contributes this and that this type of diet will expedite medical problems.  Patient states that he was unaware of this.  I also explained to the patient that a high carbohydrate diet exacerbates his blood sugar control.  His blood pressure today however is a little too low I think to add the propranolol for his esophageal varices.  At that time, my plan was: Patient's fluid overload has improved but still not at his  baseline.  Continue spironolactone 50 mg p.o. twice daily and Lasix 40 mg a day.  Recheck a CMP to monitor his renal function and his potassium.  Patient does not appear clinically dehydrated.  He still appears to be fluid overloaded.  Therefore I will continue this dose diuretic and recheck the patient in 1 week.  I will also monitor his magnesium level as this was low last time.  Encourage the patient and his wife to monitor his mental status and his bowel movement frequency.  We need to shoot for at least 3 bowel movements a day and then increase his lactulose dose on any evidence of confusion or constipation.  I also spent a tremendous amount of time explaining a low carbohydrate low saturated fat diet.  This is a means to help control his blood sugars better and also avoid worsening of his fatty liver disease.  At the present time, I feel his liver disease is so precarious that have asked him to stay away from the Lipitor.  If we are able to maintain a steady state, we may reconsider this in the future given his previous medical problems cardiovascular disease etc. however at the present time I do not believe his liver can tolerate any additional toxin.  Check in 1 week  03/02/18 Wt Readings from Last 3 Encounters:  04/30/18 242 lb (109.8 kg)  04/17/18 245 lb (111.1 kg)  03/02/18 248 lb (112.5 kg)   Patient lost 9 pounds since last week.  I am  concerned that he may be approaching dehydration.  He denies any dizziness or lightheadedness.  In fact he feels much better now.  He states that his shortness of breath has improved and is becoming more active.  His blood pressure today is normal at 120/62.  He denies any fever or abdominal pain.  He denies any melena or hematochezia.  He denies any confusion.  He denies any asterixis.  At that time, my plan was: I believe we need to decrease his dose of diuretic to avoid dehydration.  Continue Lasix 40 mg a day but decrease spironolactone to 25 mg twice a day.  Monitor his weight daily and try to maintain a stable weight consistent with today's weight of 248 pounds.  I explained this in detail to his wife and she will check his weight daily and notify us immediately if he gains or loses more than 2 pounds in a day from this baseline.  We will add propranolol 20 mg p.o. twice daily to prevent bleeding esophageal varices.  He has an appointment scheduled to see his gastroenterologist later this fall.  I recommended a flu shot when they become available.  Recheck his magnesium and potassium however he has not been taking the magnesium I recommended after his last lab draw where his magnesium was found to be low at 1.3  04/30/18 Patient has reestablish with his gastroenterologist.  His most recent lab work on October 7 showed: Appointment on 04/18/2018  Component Date Value Ref Range Status  . Folate 04/18/2018 >23.9  >5.9 ng/mL Final  . Vitamin B-12 04/18/2018 >1500* 211 - 911 pg/mL Final  Appointment on 04/17/2018  Component Date Value Ref Range Status  . Magnesium 04/17/2018 1.4* 1.5 - 2.5 mg/dL Final  . INR 04/17/2018 1.6* 0.8 - 1.0 ratio Final  . Prothrombin Time 04/17/2018 18.9* 9.6 - 13.1 sec Final  . Sodium 04/17/2018 134* 135 - 145 mEq/L Final  . Potassium 04/17/2018 4.0  3.5 - 5.1 mEq/L  Final  . Chloride 04/17/2018 104  96 - 112 mEq/L Final  . CO2 04/17/2018 25  19 - 32 mEq/L Final  .  Glucose, Bld 04/17/2018 252* 70 - 99 mg/dL Final  . BUN 04/17/2018 11  6 - 23 mg/dL Final  . Creatinine, Ser 04/17/2018 0.84  0.40 - 1.50 mg/dL Final  . Total Bilirubin 04/17/2018 2.5* 0.2 - 1.2 mg/dL Final  . Alkaline Phosphatase 04/17/2018 195* 39 - 117 U/L Final  . AST 04/17/2018 62* 0 - 37 U/L Final  . ALT 04/17/2018 34  0 - 53 U/L Final  . Total Protein 04/17/2018 6.8  6.0 - 8.3 g/dL Final  . Albumin 04/17/2018 2.5* 3.5 - 5.2 g/dL Final  . Calcium 04/17/2018 8.0* 8.4 - 10.5 mg/dL Final  . GFR 04/17/2018 98.37  >60.00 mL/min Final  . WBC 04/17/2018 5.4  4.0 - 10.5 K/uL Final  . RBC 04/17/2018 3.36* 4.22 - 5.81 Mil/uL Final  . Hemoglobin 04/17/2018 12.1* 13.0 - 17.0 g/dL Final  . HCT 04/17/2018 35.3* 39.0 - 52.0 % Final  . MCV 04/17/2018 105.1* 78.0 - 100.0 fl Final  . MCHC 04/17/2018 34.2  30.0 - 36.0 g/dL Final  . RDW 04/17/2018 15.7* 11.5 - 15.5 % Final  . Platelets 04/17/2018 51.0* 150.0 - 400.0 K/uL Final  . Neutrophils Relative % 04/17/2018 63.8  43.0 - 77.0 % Final  . Lymphocytes Relative 04/17/2018 17.2  12.0 - 46.0 % Final  . Monocytes Relative 04/17/2018 13.2* 3.0 - 12.0 % Final  . Eosinophils Relative 04/17/2018 4.9  0.0 - 5.0 % Final  . Basophils Relative 04/17/2018 0.9  0.0 - 3.0 % Final  . Neutro Abs 04/17/2018 3.4  1.4 - 7.7 K/uL Final  . Lymphs Abs 04/17/2018 0.9  0.7 - 4.0 K/uL Final  . Monocytes Absolute 04/17/2018 0.7  0.1 - 1.0 K/uL Final  . Eosinophils Absolute 04/17/2018 0.3  0.0 - 0.7 K/uL Final  . Basophils Absolute 04/17/2018 0.1  0.0 - 0.1 K/uL Final   There are persistent mild elevations in his liver function test.  However his bilirubin remains elevated, his INR remains slightly elevated, his albumin remains depressed secondary to his cirrhosis.  He states that his gastroenterologist told him that he has"approximately 30 to 50% functioning liver".  His cirrhosis is secondary to [place].  We had a long discussion today about the role cholesterol, diabetes, and  lifestyle choices play and worsening nonalcoholic steatohepatitis.  Patient continues to eat indiscriminately.  He eats a high carbohydrate diet including jelly toast for breakfast.  His sugars fluctuate wildly.  He states some mornings they are 100.  Some mornings they are over 200.  He is taking random doses of insulin depending upon what his sugars are running.  He is self administering his medication.  He is not exercising.  He continues to refuse to eat a diet rich in fruits and vegetables because he does not like the taste.  He continues to consume a high carbohydrate diet was saturated fat. Past Medical History:  Diagnosis Date  . Arthritis   . Cholelithiasis   . Cirrhosis ([place]) 2011   Cryptogenic, Likely NASH. Family/pt deny EtOH. HCV, HBV, HAV negative. ANA negative. AMA positive. Ascites 12/11  . Coronary artery disease    Inferior MI 12/11; LHC with occluded mid CFX and 80% proximal RCA. EF 55%. He had 3.0 x 28 vision BMS to CFX  . Diastolic CHF, acute ([place])    Echo 12/11 with ef 50-55% and  mild LVH. EF 55% by LV0gram in 12/11  . Esophageal varices ([place]) 2011, 2013   no hx acute variceal bleed  . Hepatic encephalopathy ([place]) 2011, 12/2013  . Hyperlipemia   . Myocardial infarction [facility]) ? 2012  . PFO (patent foramen ovale): Per TEE 04/20/2015 04/20/2015  . Portal hypertension ([place])   . Rectal varices   . S/P coronary artery stent placement 06/2010  . SVT (supraventricular tachycardia) ([place])    1/12: appeared to be an ectopic atrial tachycardia. Required DCCV with hemodynamic instability  . Type II diabetes mellitus ([place]) 2011   Past Surgical History:  Procedure Laterality Date  . APPENDECTOMY    . BIOPSY  01/02/2018   Procedure: BIOPSY;  Surgeon: Jerene Bears, MD;  Location: [facility];  Service: Gastroenterology;;  . CARDIAC CATHETERIZATION  07/01/2010   BMS to CFX.  Marland Kitchen CATARACT EXTRACTION W/PHACO Left 04/28/2016   Procedure: CATARACT EXTRACTION PHACO AND INTRAOCULAR LENS  PLACEMENT (IOC);  Surgeon: Leandrew Koyanagi, MD;  Location: [facility] ORS;  Service: Ophthalmology;  Laterality: Left;  Lot# [id number] H [place]: 05:31.7 AP%: 28.2 CDE: 93.48   . COLONOSCOPY  04/13/2012   Procedure: COLONOSCOPY;  Surgeon: Inda Castle, MD;  Location: [facility];  Service: Endoscopy;  Laterality: N/A;  . COLONOSCOPY WITH PROPOFOL N/A 03/22/2017   Procedure: COLONOSCOPY WITH PROPOFOL;  Surgeon: Doran Stabler, MD;  Location: [facility];  Service: Gastroenterology;  Laterality: N/A;  . COLONOSCOPY WITH PROPOFOL N/A 01/02/2018   Procedure: COLONOSCOPY WITH PROPOFOL;  Surgeon: Jerene Bears, MD;  Location: [facility];  Service: Gastroenterology;  Laterality: N/A;  . CORONARY ANGIOPLASTY    . CORONARY STENT PLACEMENT  06/30/2010   CFX   Distal        . ESOPHAGEAL BANDING N/A 01/01/2014   Procedure: ESOPHAGEAL BANDING;  Surgeon: Jerene Bears, MD;  Location: [facility] ENDOSCOPY;  Service: Endoscopy;  Laterality: N/A;  . ESOPHAGOGASTRODUODENOSCOPY  04/13/2012   Procedure: ESOPHAGOGASTRODUODENOSCOPY (EGD);  Surgeon: Inda Castle, MD;  Location: [facility];  Service: Endoscopy;  Laterality: N/A;  . ESOPHAGOGASTRODUODENOSCOPY N/A 01/01/2014   Procedure: ESOPHAGOGASTRODUODENOSCOPY (EGD);  Surgeon: Jerene Bears, MD;  Location[facility] ENDOSCOPY;  Service: Endoscopy;  Laterality: N/A;  . ESOPHAGOGASTRODUODENOSCOPY (EGD) WITH PROPOFOL N/A 07/12/2016   Procedure: ESOPHAGOGASTRODUODENOSCOPY (EGD) WITH PROPOFOL;  Surgeon: Jerene Bears, MD;  Location: [facility];  Service: Gastroenterology;  Laterality: N/A;  . ESOPHAGOGASTRODUODENOSCOPY (EGD) WITH PROPOFOL N/A 01/02/2018   Procedure: ESOPHAGOGASTRODUODENOSCOPY (EGD) WITH PROPOFOL;  Surgeon: Jerene Bears, MD;  Location: [facility];  Service: Gastroenterology;  Laterality: N/A;  . LEFT HEART CATHETERIZATION WITH CORONARY ANGIOGRAM N/A 10/15/2014   Procedure: LEFT HEART CATHETERIZATION WITH CORONARY ANGIOGRAM;  Surgeon: Peter M Martinique, MD;  Location[facility]  CATH LAB;  Service: Cardiovascular;  Laterality: N/A;  . orif r leg    . POLYPECTOMY  01/02/2018   Procedure: POLYPECTOMY;  Surgeon: Jerene Bears, MD;  Location: [facility];  Service: Gastroenterology;;  . TEE WITHOUT CARDIOVERSION N/A 04/20/2015   Procedure: TRANSESOPHAGEAL ECHOCARDIOGRAM (TEE);  Surgeon: Fay Records, MD;  Location: [facility];  Service: Cardiovascular;  Laterality: N/A;  . TEE WITHOUT CARDIOVERSION N/A 05/17/2017   Procedure: TRANSESOPHAGEAL ECHOCARDIOGRAM (TEE);  Surgeon: Josue Hector, MD;  Location: [facility] ENDOSCOPY;  Service: Cardiovascular;  Laterality: N/A;   Current Outpatient Medications on File Prior to Visit  Medication Sig Dispense Refill  . cyanocobalamin 1000 MCG tablet Take 1,000 mcg by mouth daily. Vitamin B12    . furosemide (LASIX) 20  MG tablet Take 1 tablet (20 mg total) by mouth daily. (Patient taking differently: Take 40 mg by mouth daily. ) 90 tablet 1  . insulin regular (NOVOLIN R,HUMULIN R) 100 units/mL injection Inject 2-8 Units into the skin 3 (three) times daily as needed for high blood sugar (CBG >150).     . INSULIN SYRINGE 1CC/29G (SAFETY INSULIN SYRINGES) 29G X 1/2" 1 ML MISC 1 application by Does not apply route daily. 30 each 0  . lactulose (CHRONULAC) 10 GM/15ML solution Take 45 mLs (30 g total) by mouth 3 (three) times daily. 1892 mL 3  . levofloxacin (LEVAQUIN) 500 MG tablet Take 500 mg by mouth daily.     . magnesium oxide (MAG-OX) 400 MG tablet Take 1 tablet (400 mg total) by mouth daily. 90 tablet 1  . Multiple Vitamin (MULTIVITAMIN WITH MINERALS) TABS tablet Take 1 tablet by mouth daily.    . nitroGLYCERIN (NITROSTAT) 0.4 MG SL tablet Place 1 tablet (0.4 mg total) under the tongue every 5 (five) minutes as needed for chest pain (chest pain). 25 tablet 5  . potassium chloride (K-DUR) 10 MEQ tablet Take 1 tablet (10 mEq total) by mouth daily. 90 tablet 1  . propranolol (INDERAL) 20 MG tablet Take 1 tablet (20 mg total) by mouth 2 (two)  times daily. 60 tablet 3  . ranitidine (ZANTAC) 150 MG tablet Take 150 mg by mouth 2 (two) times daily as needed for heartburn.     . spironolactone (ALDACTONE) 50 MG tablet Take 1 tablet (50 mg total) by mouth 2 (two) times daily. (Patient taking differently: Take 25 mg by mouth 2 (two) times daily. ) 60 tablet 1   No current facility-administered medications on file prior to visit.    Allergies  Allergen Reactions  . Isosorbide Nitrate Other (See Comments)    Nose bleeds   Social History   Socioeconomic History  . Marital status: Married    Spouse name: Not on file  . Number of children: 3  . Years of education: Not on file  . Highest education level: Not on file  Occupational History  . Occupation: Merchandiser, retail: OTHER    Comment: Worked in Theatre manager prior  Social Needs  . Financial resource strain: Not on file  . Food insecurity:    Worry: Not on file    Inability: Not on file  . Transportation needs:    Medical: Not on file    Non-medical: Not on file  Tobacco Use  . Smoking status: Current Every Day Smoker    Packs/day: 0.25    Years: 36.00    Pack years: 9.00    Types: E-cigarettes, Cigarettes  . Smokeless tobacco: Never Used  . Tobacco comment: uses vapor cigarettes (2016 ); I smoke about 4 cigarettes a day  Substance and Sexual Activity  . Alcohol use: No    Alcohol/week: 0.0 standard drinks  . Drug use: No  . Sexual activity: Never  Lifestyle  . Physical activity:    Days per week: Not on file    Minutes per session: Not on file  . Stress: Not on file  Relationships  . Social connections:    Talks on phone: Not on file    Gets together: Not on file    Attends religious service: Not on file    Active member of club or organization: Not on file    Attends meetings of clubs or organizations: Not on file    Relationship status:  Not on file  . Intimate partner violence:    Fear of current or ex partner: Not on file    Emotionally abused: Not  on file    Physically abused: Not on file    Forced sexual activity: Not on file  Other Topics Concern  . Not on file  Social History Narrative   Married   Gets regular exercise: walking    Review of Systems  All other systems reviewed and are negative.      Objective:   Physical Exam  Constitutional: He appears well-developed and well-nourished. No distress.  Neck: No JVD present.  Cardiovascular: Normal rate and regular rhythm.  Murmur heard. Pulmonary/Chest: No respiratory distress. He has no rales.  Abdominal: Soft. Bowel sounds are normal. He exhibits distension. He exhibits no mass. There is no tenderness. There is no rebound and no guarding.  Musculoskeletal: He exhibits edema.  Skin: He is not diaphoretic.  Vitals reviewed.         Assessment & Plan:  Uncontrolled diabetes mellitus type 2 without complications ([place]) - Plan: Hemoglobin A1c, Lipid panel  Liver cirrhosis secondary to NASH ([place])  I do not believe this patient is a good insulin candidate due to noncompliance and the risk of hypoglycemia.  I believe he would be an excellent Trulicity candidate.  I will check a hemoglobin A1c.  If greater than 7 as I expect, I will likely start the patient on Trulicity.  His most recent hemoglobin A1c was checked in April and was found to be greater than 8.  I will also check a fasting lipid panel.  I would like to drive his cholesterol and triglycerides as low as possible without starting medications that are toxic to the liver.  Therefore we had a long discussion today about making healthy dietary choices.  Recommended the flu shot and the patient received this today.

## 2018-04-30 NOTE — Addendum Note (Signed)
Addended by: Shary Decamp B on: 04/30/2018 10:08 AM   Modules accepted: Orders

## 2018-05-01 ENCOUNTER — Encounter: Payer: Self-pay | Admitting: *Deleted

## 2018-05-01 LAB — LIPID PANEL
CHOL/HDL RATIO: 2.5 (calc) (ref <5.0)
CHOLESTEROL: 113 mg/dL (ref <200)
HDL: 46 mg/dL (ref >40)
LDL Cholesterol (Calc): 55 mg/dL (calc)
Non-HDL Cholesterol (Calc): 67 mg/dL (calc) (ref <130)
Triglycerides: 42 mg/dL (ref <150)

## 2018-05-01 LAB — HEMOGLOBIN A1C
EAG (MMOL/L): 8.2 (calc)
HEMOGLOBIN A1C: 6.8 %{Hb} — AB (ref <5.7)
MEAN PLASMA GLUCOSE: 148 (calc)

## 2018-05-27 ENCOUNTER — Other Ambulatory Visit: Payer: Self-pay | Admitting: Family Medicine

## 2018-05-28 MED FILL — levoFLOXacin 500 MG TABS: 500 | 30 days supply | Qty: 30 | Fill #10

## 2018-06-20 ENCOUNTER — Other Ambulatory Visit: Payer: Self-pay

## 2018-06-20 NOTE — Patient Outreach (Signed)
[facility]) Care Management  06/20/2018   Adam Butler August 29, 1955 [id number]  Subjective: Successful telephone call to the patient for assessment. HIPAA verified.  The patient states that he is doing well.  He denies any pain or falls.  He states that is blood sugar this morning was 123.  He had his a1c rechecked on 10/21/20019 and his it went down from 8.1 to 6.8.  I congratulated the patient on his efforts.  The patient said that he is trying to watch his diet but he will eat what he likes.  He is taking his medication as prescribed. The patient states that he received his flu shot at his visit in October.    Current Medications:  Current Outpatient Medications  Medication Sig Dispense Refill  . cyanocobalamin 1000 MCG tablet Take 1,000 mcg by mouth daily. Vitamin B12    . furosemide (LASIX) 20 MG tablet Take 1 tablet (20 mg total) by mouth daily. (Patient taking differently: Take 40 mg by mouth daily. ) 90 tablet 1  . insulin regular (NOVOLIN R,HUMULIN R) 100 units/mL injection Inject 2-8 Units into the skin 3 (three) times daily as needed for high blood sugar (CBG >150).     . INSULIN SYRINGE 1CC/29G (SAFETY INSULIN SYRINGES) 29G X 1/2" 1 ML MISC 1 application by Does not apply route daily. 30 each 0  . lactulose (CHRONULAC) 10 GM/15ML solution Take 45 mLs (30 g total) by mouth 3 (three) times daily. 1892 mL 3  . levofloxacin (LEVAQUIN) 500 MG tablet Take 500 mg by mouth daily.     . magnesium oxide (MAG-OX) 400 MG tablet Take 1 tablet (400 mg total) by mouth daily. 90 tablet 1  . Multiple Vitamin (MULTIVITAMIN WITH MINERALS) TABS tablet Take 1 tablet by mouth daily.    . nitroGLYCERIN (NITROSTAT) 0.4 MG SL tablet Place 1 tablet (0.4 mg total) under the tongue every 5 (five) minutes as needed for chest pain (chest pain). 25 tablet 5  . potassium chloride (K-DUR) 10 MEQ tablet Take 1 tablet (10 mEq total) by mouth daily. 90 tablet 1  . propranolol (INDERAL) 20 MG  tablet Take 1 tablet (20 mg total) by mouth 2 (two) times daily. 60 tablet 3  . spironolactone (ALDACTONE) 50 MG tablet Take 0.5 tablets (25 mg total) by mouth 2 (two) times daily. 90 tablet 3  . ranitidine (ZANTAC) 150 MG tablet Take 150 mg by mouth 2 (two) times daily as needed for heartburn.      No current facility-administered medications for this visit.     Functional Status:  In your present state of health, do you have any difficulty performing the following activities: 02/14/2018 01/25/2018  Hearing? N N  Vision? N N  Difficulty concentrating or making decisions? Y N  Comment - -  Walking or climbing stairs? Y Y  Dressing or bathing? N N  Doing errands, shopping? N N  Some recent data might be hidden    Fall/Depression Screening: Fall Risk  06/20/2018 03/21/2018 02/14/2018  Falls in the past year? 0 No Yes  Comment - - -  Number falls in past yr: - - 1  Injury with Fall? - - No  Risk Factor Category  - - -  Risk for fall due to : - - Impaired balance/gait  Risk for fall due to: Comment - - -  Follow up - - Education provided;Falls prevention discussed   PHQ 2/9 Scores 02/14/2018 01/31/2018 12/22/2017 06/13/2017 11/17/2016 07/19/2016 01/11/2016  PHQ - 2 Score 1 4 6  0 0 1 0  PHQ- 9 Score - 16 18 - - - -    Assessment: Patient will continue to benefit from health coach outreach for disease management and support. [facility] CM Care Plan Problem One     Most Recent Value  [facility] Long Term Goal   In 60 days the patient will maintain his a1c of 6.8   [facility] Long Term Goal Start Date  06/20/18  Interventions for Problem One Long Term Goal  Discussed with the patient about his blood sugar readings, diet and medication adherence.       Plan:  RN Health Coach will contact patient in the month of February and patient agrees to next outreach.   Lazaro Arms RN, BSN, [place] Direct Dial:  [phone]  Fax: [phone]

## 2018-06-25 ENCOUNTER — Other Ambulatory Visit: Payer: Self-pay | Admitting: Infectious Disease

## 2018-06-26 ENCOUNTER — Telehealth: Payer: Self-pay | Admitting: Family Medicine

## 2018-06-26 NOTE — Telephone Encounter (Signed)
Just making sure before I send this in - he does take this daily? (per chart he does) We have not Rx'd it,

## 2018-06-26 NOTE — Telephone Encounter (Signed)
Pt needs refill on levofloxacin to [facility] [place].

## 2018-06-26 NOTE — Telephone Encounter (Signed)
Ok with temp refill but this needs to come from his GI if he is taking this for prophylaxis of SBP.  I am not sure how long they would want him to stay on this.

## 2018-06-27 MED ORDER — LEVOFLOXACIN 500 MG PO TABS: 500.0000 mg | ORAL_TABLET | Freq: Every day | ORAL | 0 refills | Status: DC

## 2018-06-27 NOTE — Telephone Encounter (Signed)
Medication called/sent to requested pharmacy and pt aware via mychart to contact GI MD for refills.

## 2018-07-12 ENCOUNTER — Other Ambulatory Visit: Payer: Self-pay | Admitting: Family Medicine

## 2018-07-27 ENCOUNTER — Other Ambulatory Visit: Payer: Self-pay | Admitting: Family Medicine

## 2018-07-30 ENCOUNTER — Telehealth: Payer: Self-pay

## 2018-07-30 ENCOUNTER — Other Ambulatory Visit: Payer: Self-pay

## 2018-07-30 DIAGNOSIS — K7460 Unspecified cirrhosis of liver: Secondary | ICD-10-CM

## 2018-07-30 NOTE — Telephone Encounter (Signed)
-----   Message from Algernon Huxley, RN sent at 02/06/2018 11:09 AM EDT ----- Regarding: [place] ABD Pt need [place] for [facility] screening

## 2018-07-30 NOTE — Telephone Encounter (Signed)
Pt scheduled for [place] of abd at [facility] 08/13/18@10am , pt to arrive there at 9:45am. Pt to be NPO after midnight. Pt aware of appt.

## 2018-08-07 ENCOUNTER — Other Ambulatory Visit: Payer: Self-pay | Admitting: Family Medicine

## 2018-08-10 ENCOUNTER — Encounter: Payer: Self-pay | Admitting: Family Medicine

## 2018-08-10 ENCOUNTER — Ambulatory Visit (INDEPENDENT_AMBULATORY_CARE_PROVIDER_SITE_OTHER): Payer: PPO | Admitting: Family Medicine

## 2018-08-10 VITALS — BP 120/64 | HR 74 | Temp 98.1°F | Resp 20 | Ht 66.0 in | Wt 257.0 lb

## 2018-08-10 DIAGNOSIS — N5201 Erectile dysfunction due to arterial insufficiency: Secondary | ICD-10-CM | POA: Diagnosis not present

## 2018-08-10 DIAGNOSIS — F5101 Primary insomnia: Secondary | ICD-10-CM | POA: Diagnosis not present

## 2018-08-10 MED ORDER — PROPRANOLOL HCL 20 MG PO TABS: 20.0000 mg | ORAL_TABLET | Freq: Two times a day (BID) | ORAL | 5 refills | Status: DC

## 2018-08-10 NOTE — Progress Notes (Signed)
Subjective:    Patient ID: Adam Butler, male    DOB: Jun 19, 1956, 63 y.o.   MRN: [id number]  HPI 02/16/18 Patient is transferring care to me from my partner due to medical complexity.  I have copied relevant portions of her recent office visit for my reference:  Adam Butler is a 63 y.o. male, presents to clinic with CC of hospital followup after admission 01/25/18-01/27/18 for hepatic encephalopathy, cirrhosis secondary to NASH, IDDM with hyperglycemia and hypokalemia.  Pt was fluid overloaded ~ 30 lbs at 270 lbs (lowest recent weight near my first visit with this pt was 240 lbs), altered and tachypneic.  He has his paperwork with him with several medication changes that he has not been compliant with.   Lactulose 30 mg TID, pt states he is taking 10 mg lactulose once nightly.  Pt states that works better for his BM's and it is allowing him to get some sleep and he is less sleepy throughout the daytime.  His insulin was also changed.  His was instructed to do lantus 10 units at night and Novolin R sliding scale TID. He does not want to take lantus, saying it was too expensive in the past, and he has been doing novolin R for years and it is affordable.  He reports morning sugars 122-124 in the am.  A recent low was 80 didn't feel well, sweaty and hungry, 180 highest after eating more at night.   He was discharged from the hospital with decreased spironolactone and increased lasix dose.  He is breathing good, is walking a lot, feeling better.  He was "feeling drunk" and sleeping throughout the day and now he is sleeping better at night and awake during the day more.  But he does not feel like he's not getting enough fluid off his legs despite taking the medicines.  He still has severely swollen feet, ankles and legs, and his abdomen does also feel enlarged with swelling in abdominal skin as well.  He wants to wear compression stockings but is having difficulty getting them on.    GI/Dr. Hilarie Fredrickson -  pt states he saw GI while in the hospital, he has spoken to their office, appointment pending and f/up LUQ [place] pending.   02/16/18 Patient had an EGD performed 01/02/2018 which revealed Candida esophagitis as well as small esophageal varices.  He is not currently taking propranolol.  He is on Levaquin for prevention of spontaneous bacterial peritonitis.  I see no evidence that he is followed up with GI as planned.  His potassium levels remain borderline low.  Magnesium levels remain low.  Patient states he is currently taking lactulose once a day but this is allowing him 5-6 bowel movements a day.  He is taking 40 mg of Lasix once a day and only 25 mg of Spironolactone once a day.  He has +2 pitting edema in both legs.  His weight is still 11 pounds heavier than his baseline when he established with [place].  There are faint bibasilar crackles and obvious ascites on his exam.  He appears fluid overloaded.  At that time, my plan was:  We need to optimize the management of his cirrhosis.  First and foremost I will schedule the patient to follow-up with Dr. Hilarie Fredrickson.  I have recommended that he follow-up with Dr. Hilarie Fredrickson regularly most likely every 3 to 4 months until stable.  Second we need to address his fluid overload.  I would like to get the patient  ultimately on a 100 mg a day of spironolactone if he can tolerate this.  Therefore he will continue Lasix 40 mg a day in the morning.  We will add spironolactone 50 mg p.o. twice daily.  He can discontinue his potassium supplement.  Reassess the patient in 1 week to monitor for evidence of dehydration or hypotension or hyperkalemia.  Next we need to address his esophageal varices to prevent future upper GI bleeds.  If his blood pressure can tolerate the increased dose of spironolactone in 1 week I plan to start the patient on propranolol 20 mg p.o. twice daily.  He will continue Levaquin for SBP prophylaxis.  I have encouraged the patient to titrate his lactulose to achieve  5-6 bowel movements per day.  If he is truly accomplishing this with 1 dose of lactulose then I would not make any changes at the present time.  However I encouraged his wife to monitor the patient and if he is not going to the bathroom regularly she will need to increase the lactulose to 3 times daily.  Patient is currently doing sliding scale insulin.  He is not taking Lantus.  I have asked him to record his pre-meal blood sugars with breakfast lunch and dinner as well as his evening blood sugar and return in 1 week for me to review.  I believe he would do better with basal insulin however I will review his sugars and see how well he is controlling his sugars with his Novolin R.  02/23/18 Wt Readings from Last 3 Encounters:  08/10/18 257 lb (116.6 kg)  04/30/18 242 lb (109.8 kg)  04/17/18 245 lb (111.1 kg)   Patient has lost 5 lbs over the last week.  He is approaching his baseline weight.  Patient has an appointment to see the gastroenterologist in October.  While his swelling is better, he is still swollen significantly in his left leg +2, and +1 in his right leg.  His lungs sound better today.  His wife states that once this week, he was demonstrating asterixis and some confusion however she was able to modify his lactulose and avoid taking him to the hospital.  I emphasized that he needs to have 3 bowel movements a day and that we may need to increase his lactulose accordingly to achieve this particularly if he is confused or disoriented.  Today he appears to be at his baseline.  He presents with fasting sugars.  Typically his fasting sugars are 1 40-1 88.  His sugars later in the day typically are between 203 100.  He is unable to tolerate/afford Lantus or basal insulin.  He is using only regular insulin.  We discussed for more than 25 minutes a day his diet.  He is eating fried foods, fast foods, high carbohydrate diet.  He refuses to eat vegetables.  This is based on taste preferences.  However the  patient did not realize that his cirrhosis was due to fatty liver disease.  I explained to him that fast food fried foods high carbohydrate diet contributes this and that this type of diet will expedite medical problems.  Patient states that he was unaware of this.  I also explained to the patient that a high carbohydrate diet exacerbates his blood sugar control.  His blood pressure today however is a little too low I think to add the propranolol for his esophageal varices.  At that time, my plan was: Patient's fluid overload has improved but still not at his  baseline.  Continue spironolactone 50 mg p.o. twice daily and Lasix 40 mg a day.  Recheck a CMP to monitor his renal function and his potassium.  Patient does not appear clinically dehydrated.  He still appears to be fluid overloaded.  Therefore I will continue this dose diuretic and recheck the patient in 1 week.  I will also monitor his magnesium level as this was low last time.  Encourage the patient and his wife to monitor his mental status and his bowel movement frequency.  We need to shoot for at least 3 bowel movements a day and then increase his lactulose dose on any evidence of confusion or constipation.  I also spent a tremendous amount of time explaining a low carbohydrate low saturated fat diet.  This is a means to help control his blood sugars better and also avoid worsening of his fatty liver disease.  At the present time, I feel his liver disease is so precarious that have asked him to stay away from the Lipitor.  If we are able to maintain a steady state, we may reconsider this in the future given his previous medical problems cardiovascular disease etc. however at the present time I do not believe his liver can tolerate any additional toxin.  Check in 1 week  03/02/18 Wt Readings from Last 3 Encounters:  08/10/18 257 lb (116.6 kg)  04/30/18 242 lb (109.8 kg)  04/17/18 245 lb (111.1 kg)   Patient lost 9 pounds since last week.  I am  concerned that he may be approaching dehydration.  He denies any dizziness or lightheadedness.  In fact he feels much better now.  He states that his shortness of breath has improved and is becoming more active.  His blood pressure today is normal at 120/62.  He denies any fever or abdominal pain.  He denies any melena or hematochezia.  He denies any confusion.  He denies any asterixis.  At that time, my plan was: I believe we need to decrease his dose of diuretic to avoid dehydration.  Continue Lasix 40 mg a day but decrease spironolactone to 25 mg twice a day.  Monitor his weight daily and try to maintain a stable weight consistent with today's weight of 248 pounds.  I explained this in detail to his wife and she will check his weight daily and notify us immediately if he gains or loses more than 2 pounds in a day from this baseline.  We will add propranolol 20 mg p.o. twice daily to prevent bleeding esophageal varices.  He has an appointment scheduled to see his gastroenterologist later this fall.  I recommended a flu shot when they become available.  Recheck his magnesium and potassium however he has not been taking the magnesium I recommended after his last lab draw where his magnesium was found to be low at 1.3  04/30/18 Patient has reestablish with his gastroenterologist.  His most recent lab work on October 7 showed: No visits with results within 3 Week(s) from this visit.  Latest known visit with results is:  Office Visit on 04/30/2018  Component Date Value Ref Range Status  . Hgb A1c MFr Bld 04/30/2018 6.8* <5.7 % of total Hgb Final   Comment: For someone without known diabetes, a hemoglobin A1c value of 6.5% or greater indicates that they may have  diabetes and this should be confirmed with a follow-up  test. . For someone with known diabetes, a value <7% indicates  that their diabetes is well  controlled and a value  greater than or equal to 7% indicates suboptimal  control. A1c targets  should be individualized based on  duration of diabetes, age, comorbid conditions, and  other considerations. . Currently, no consensus exists regarding use of hemoglobin A1c for diagnosis of diabetes for children. .   . Mean Plasma Glucose 04/30/2018 148  (calc) Final  . eAG (mmol/L) 04/30/2018 8.2  (calc) Final  . Cholesterol 04/30/2018 113  <200 mg/dL Final  . HDL 04/30/2018 46  >40 mg/dL Final  . Triglycerides 04/30/2018 42  <150 mg/dL Final  . LDL Cholesterol (Calc) 04/30/2018 55  mg/dL (calc) Final   Comment: Reference range: <100 . Desirable range <100 mg/dL for primary prevention;   <70 mg/dL for patients with CHD or diabetic patients  with > or = 2 CHD risk factors. Marland Kitchen LDL-C is now calculated using the Martin-Hopkins  calculation, which is a validated novel method providing  better accuracy than the Friedewald equation in the  estimation of LDL-C.  Cresenciano Genre et al. Annamaria Helling. 4259;563(87): 2061-2068  (http://education.[employer].com/faq/FAQ164)   . Total CHOL/HDL Ratio 04/30/2018 2.5  <5.0 (calc) Final  . Non-HDL Cholesterol (Calc) 04/30/2018 67  <130 mg/dL (calc) Final   Comment: For patients with diabetes plus 1 major ASCVD risk  factor, treating to a non-HDL-C goal of <100 mg/dL  (LDL-C of <70 mg/dL) is considered a therapeutic  option.    There are persistent mild elevations in his liver function test.  However his bilirubin remains elevated, his INR remains slightly elevated, his albumin remains depressed secondary to his cirrhosis.  He states that his gastroenterologist told him that he has"approximately 30 to 50% functioning liver".  His cirrhosis is secondary to Van.  We had a long discussion today about the role cholesterol, diabetes, and lifestyle choices play and worsening nonalcoholic steatohepatitis.  Patient continues to eat indiscriminately.  He eats a high carbohydrate diet including jelly toast for breakfast.  His sugars fluctuate wildly.  He states some  mornings they are 100.  Some mornings they are over 200.  He is taking random doses of insulin depending upon what his sugars are running.  He is self administering his medication.  He is not exercising.  He continues to refuse to eat a diet rich in fruits and vegetables because he does not like the taste.  He continues to consume a high carbohydrate diet was saturated fat.  At that time, my plan was: I do not believe this patient is a good insulin candidate due to noncompliance and the risk of hypoglycemia.  I believe he would be an excellent Trulicity candidate.  I will check a hemoglobin A1c.  If greater than 7 as I expect, I will likely start the patient on Trulicity.  His most recent hemoglobin A1c was checked in April and was found to be greater than 8.  I will also check a fasting lipid panel.  I would like to drive his cholesterol and triglycerides as low as possible without starting medications that are toxic to the liver.  Therefore we had a long discussion today about making healthy dietary choices.  Recommended the flu shot and the patient received this today.  08/10/18 Since I last saw the patient, he states that he is doing much better medically.  He denies any recent episodes of encephalopathy.  Unfortunately, he is separating from his wife and they are getting divorced due to constant arguments that they are having.  He is here today requesting something  for insomnia.  He states that when he goes to sleep, he will sleep 10 to 15 minutes and then awaken.  Well to take him several hours to fall back asleep.  He has significant cirrhosis and a history of an hepatic encephalopathy and therefore I am hesitant to add any centrally acting Acacian that may compound confusion and delirium.  We had a long discussion about this.  He is also requesting something for erectile dysfunction.  Now that he separated from his wife, he is wanting to be able to be sexually active with any new partners he may have.   Unfortunately he still uses nitroglycerin occasionally for chest pain.  We had a long discussion today about mixing nitroglycerin and Viagra or medicine similar to Viagra.  I explained that this can cause unintended consequences and even death due to precipitous drops in his blood pressure.  I am concerned that the patient may be confused and use the 2 medications within the same 24-hour..  I explained to him that this would be extremely dangerous and hazardous to his health. Past Medical History:  Diagnosis Date  . Arthritis   . Cholelithiasis   . Cirrhosis ([place]) 2011   Cryptogenic, Likely NASH. Family/pt deny EtOH. HCV, HBV, HAV negative. ANA negative. AMA positive. Ascites 12/11  . Coronary artery disease    Inferior MI 12/11; LHC with occluded mid CFX and 80% proximal RCA. EF 55%. He had 3.0 x 28 vision BMS to CFX  . Diastolic CHF, acute ([place])    Echo 12/11 with ef 50-55% and mild LVH. EF 55% by LV0gram in 12/11  . Esophageal varices ([place]) 2011, 2013   no hx acute variceal bleed  . Hepatic encephalopathy ([place]) 2011, 12/2013  . Hyperlipemia   . Myocardial infarction [facility]) ? 2012  . PFO (patent foramen ovale): Per TEE 04/20/2015 04/20/2015  . Portal hypertension ([place])   . Rectal varices   . S/P coronary artery stent placement 06/2010  . SVT (supraventricular tachycardia) ([place])    1/12: appeared to be an ectopic atrial tachycardia. Required DCCV with hemodynamic instability  . Type II diabetes mellitus ([place]) 2011   Past Surgical History:  Procedure Laterality Date  . APPENDECTOMY    . BIOPSY  01/02/2018   Procedure: BIOPSY;  Surgeon: Jerene Bears, MD;  Location: [facility];  Service: Gastroenterology;;  . CARDIAC CATHETERIZATION  07/01/2010   BMS to CFX.  Marland Kitchen CATARACT EXTRACTION W/PHACO Left 04/28/2016   Procedure: CATARACT EXTRACTION PHACO AND INTRAOCULAR LENS PLACEMENT (IOC);  Surgeon: Leandrew Koyanagi, MD;  Location: [facility] ORS;  Service: Ophthalmology;  Laterality: Left;  Lot#  [id number] H [place]: 05:31.7 AP%: 28.2 CDE: 93.48   . COLONOSCOPY  04/13/2012   Procedure: COLONOSCOPY;  Surgeon: Inda Castle, MD;  Location: [facility];  Service: Endoscopy;  Laterality: N/A;  . COLONOSCOPY WITH PROPOFOL N/A 03/22/2017   Procedure: COLONOSCOPY WITH PROPOFOL;  Surgeon: Doran Stabler, MD;  Location: [facility];  Service: Gastroenterology;  Laterality: N/A;  . COLONOSCOPY WITH PROPOFOL N/A 01/02/2018   Procedure: COLONOSCOPY WITH PROPOFOL;  Surgeon: Jerene Bears, MD;  Location: [facility];  Service: Gastroenterology;  Laterality: N/A;  . CORONARY ANGIOPLASTY    . CORONARY STENT PLACEMENT  06/30/2010   CFX   Distal        . ESOPHAGEAL BANDING N/A 01/01/2014   Procedure: ESOPHAGEAL BANDING;  Surgeon: Jerene Bears, MD;  Location: [facility]COPY;  Service: Endoscopy;  Laterality: N/A;  . ESOPHAGOGASTRODUODENOSCOPY  04/13/2012  Procedure: ESOPHAGOGASTRODUODENOSCOPY (EGD);  Surgeon: Inda Castle, MD;  Location: [facility];  Service: Endoscopy;  Laterality: N/A;  . ESOPHAGOGASTRODUODENOSCOPY N/A 01/01/2014   Procedure: ESOPHAGOGASTRODUODENOSCOPY (EGD);  Surgeon: Jerene Bears, MD;  Location[facility];  Service: Endoscopy;  Laterality: N/A;  . ESOPHAGOGASTRODUODENOSCOPY (EGD) WITH PROPOFOL N/A 07/12/2016   Procedure: ESOPHAGOGASTRODUODENOSCOPY (EGD) WITH PROPOFOL;  Surgeon: Jerene Bears, MD;  Location: [facility];  Service: Gastroenterology;  Laterality: N/A;  . ESOPHAGOGASTRODUODENOSCOPY (EGD) WITH PROPOFOL N/A 01/02/2018   Procedure: ESOPHAGOGASTRODUODENOSCOPY (EGD) WITH PROPOFOL;  Surgeon: Jerene Bears, MD;  Location: [facility];  Service: Gastroenterology;  Laterality: N/A;  . LEFT HEART CATHETERIZATION WITH CORONARY ANGIOGRAM N/A 10/15/2014   Procedure: LEFT HEART CATHETERIZATION WITH CORONARY ANGIOGRAM;  Surgeon: Peter M Martinique, MD;  Location[facility] CATH LAB;  Service: Cardiovascular;  Laterality: N/A;  . orif r leg    . POLYPECTOMY  01/02/2018   Procedure: POLYPECTOMY;   Surgeon: Jerene Bears, MD;  Location: [facility];  Service: Gastroenterology;;  . TEE WITHOUT CARDIOVERSION N/A 04/20/2015   Procedure: TRANSESOPHAGEAL ECHOCARDIOGRAM (TEE);  Surgeon: Fay Records, MD;  Location: [place];  Service: Cardiovascular;  Laterality: N/A;  . TEE WITHOUT CARDIOVERSION N/A 05/17/2017   Procedure: TRANSESOPHAGEAL ECHOCARDIOGRAM (TEE);  Surgeon: Josue Hector, MD;  Location: [facility] ENDOSCOPY;  Service: Cardiovascular;  Laterality: N/A;   Current Outpatient Medications on File Prior to Visit  Medication Sig Dispense Refill  . cyanocobalamin 1000 MCG tablet Take 1,000 mcg by mouth daily. Vitamin B12    . furosemide (LASIX) 20 MG tablet Take 1 tablet (20 mg total) by mouth daily. (Patient taking differently: Take 40 mg by mouth daily. ) 90 tablet 1  . insulin regular (NOVOLIN R,HUMULIN R) 100 units/mL injection Inject 2-8 Units into the skin 3 (three) times daily as needed for high blood sugar (CBG >150).     . INSULIN SYRINGE 1CC/29G (SAFETY INSULIN SYRINGES) 29G X 1/2" 1 ML MISC 1 application by Does not apply route daily. 30 each 0  . lactulose (CHRONULAC) 10 GM/15ML solution Take 45 mLs (30 g total) by mouth 3 (three) times daily. 1892 mL 3  . levofloxacin (LEVAQUIN) 500 MG tablet TAKE 1 TABLET BY MOUTH  DAILY 30 tablet 0  . magnesium oxide (MAG-OX) 400 MG tablet Take 1 tablet (400 mg total) by mouth daily. 90 tablet 1  . Multiple Vitamin (MULTIVITAMIN WITH MINERALS) TABS tablet Take 1 tablet by mouth daily.    . nitroGLYCERIN (NITROSTAT) 0.4 MG SL tablet Place 1 tablet (0.4 mg total) under the tongue every 5 (five) minutes as needed for chest pain (chest pain). 25 tablet 5  . potassium chloride (K-DUR) 10 MEQ tablet Take 1 tablet (10 mEq total) by mouth daily. 90 tablet 1  . spironolactone (ALDACTONE) 50 MG tablet Take 0.5 tablets (25 mg total) by mouth 2 (two) times daily. 90 tablet 3   No current facility-administered medications on file prior to visit.     Allergies  Allergen Reactions  . Isosorbide Nitrate Other (See Comments)    Nose bleeds   Social History   Socioeconomic History  . Marital status: Married    Spouse name: Not on file  . Number of children: 3  . Years of education: Not on file  . Highest education level: Not on file  Occupational History  . Occupation: Merchandiser, retail: OTHER    Comment: Worked in Theatre manager prior  Social Needs  . Financial resource strain: Not on  file  . Food insecurity:    Worry: Not on file    Inability: Not on file  . Transportation needs:    Medical: Not on file    Non-medical: Not on file  Tobacco Use  . Smoking status: Current Every Day Smoker    Packs/day: 0.25    Years: 36.00    Pack years: 9.00    Types: E-cigarettes, Cigarettes  . Smokeless tobacco: Never Used  . Tobacco comment: uses vapor cigarettes (2016 ); I smoke about 4 cigarettes a day  Substance and Sexual Activity  . Alcohol use: No    Alcohol/week: 0.0 standard drinks  . Drug use: No  . Sexual activity: Never  Lifestyle  . Physical activity:    Days per week: Not on file    Minutes per session: Not on file  . Stress: Not on file  Relationships  . Social connections:    Talks on phone: Not on file    Gets together: Not on file    Attends religious service: Not on file    Active member of club or organization: Not on file    Attends meetings of clubs or organizations: Not on file    Relationship status: Not on file  . Intimate partner violence:    Fear of current or ex partner: Not on file    Emotionally abused: Not on file    Physically abused: Not on file    Forced sexual activity: Not on file  Other Topics Concern  . Not on file  Social History Narrative   Married   Gets regular exercise: walking    Review of Systems  All other systems reviewed and are negative.      Objective:   Physical Exam Vitals signs reviewed.  Constitutional:      General: He is not in acute distress.     Appearance: He is well-developed. He is not diaphoretic.  Neck:     Vascular: No JVD.  Cardiovascular:     Rate and Rhythm: Normal rate and regular rhythm.     Heart sounds: Murmur present.  Pulmonary:     Effort: No respiratory distress.     Breath sounds: No rales.  Abdominal:     General: Bowel sounds are normal. There is distension.     Palpations: Abdomen is soft. There is no mass.     Tenderness: There is no abdominal tenderness. There is no guarding or rebound.           Assessment & Plan:  Primary insomnia  Erectile dysfunction due to arterial insufficiency  Due to the reasons outlined in the history of present illness I have recommended trying over-the-counter melatonin.  He will take this 30 minutes prior to bed and use this for 2 to 3 weeks and allow the medication a chance to reach therapeutic levels in his body to see if this will help with insomnia due to safety concerns about using other medication.  I would be willing to consider a low-dose trazodone such as 25 mg p.o. nightly if melatonin is unsuccessful.  I will not prescribe Viagra because I am concerned the patient will inadvertently use this and nitroglycerin simultaneously.  Therefore I recommended that he try a vacuum assist device through Medicare that would cause no problems with any of his other medications or his underlying medical problems.

## 2018-08-13 ENCOUNTER — Ambulatory Visit (HOSPITAL_COMMUNITY)
Admission: RE | Admit: 2018-08-13 | Discharge: 2018-08-13 | Disposition: A | Discharge status: Home / Self Care | Payer: PPO | Source: Ambulatory Visit | Attending: Internal Medicine | Admitting: Internal Medicine

## 2018-08-13 DIAGNOSIS — K7460 Unspecified cirrhosis of liver: Secondary | ICD-10-CM | POA: Diagnosis not present

## 2018-08-20 ENCOUNTER — Encounter: Payer: Self-pay | Admitting: Internal Medicine

## 2018-08-21 ENCOUNTER — Other Ambulatory Visit: Payer: Self-pay

## 2018-08-21 NOTE — Patient Outreach (Signed)
[facility]) Care Management  08/21/2018   Adam Butler 04-21-1956 [id number]  Subjective: Successful outreach the patient for assessment.  HIPAA verified.  The patient states that he is feeling great.  He is feeling the best he ever has.  He is very complimentary of his PCP.  He denies any pain but states that he did fall out of the bed. He did not injure himself.  He rolled the wrong way and fell.  He states that  he is checking his blood sugars daily.  His blood sugar this morning was 208.  They have been ranging from 150-225.  He said that he does not understand why. Discussed with the patient about his food choices,drinks and portion sizes.  He verbalized understanding.  He states that he takes all of his medications he should.  His next appointment to follow up on his diabetes will be in may.  Current Medications:  Current Outpatient Medications  Medication Sig Dispense Refill  . cyanocobalamin 1000 MCG tablet Take 1,000 mcg by mouth daily. Vitamin B12    . furosemide (LASIX) 20 MG tablet Take 1 tablet (20 mg total) by mouth daily. (Patient taking differently: Take 40 mg by mouth daily. ) 90 tablet 1  . insulin regular (NOVOLIN R,HUMULIN R) 100 units/mL injection Inject 2-8 Units into the skin 3 (three) times daily as needed for high blood sugar (CBG >150).     . INSULIN SYRINGE 1CC/29G (SAFETY INSULIN SYRINGES) 29G X 1/2" 1 ML MISC 1 application by Does not apply route daily. 30 each 0  . lactulose (CHRONULAC) 10 GM/15ML solution Take 45 mLs (30 g total) by mouth 3 (three) times daily. 1892 mL 3  . levofloxacin (LEVAQUIN) 500 MG tablet TAKE 1 TABLET BY MOUTH  DAILY 30 tablet 0  . magnesium oxide (MAG-OX) 400 MG tablet Take 1 tablet (400 mg total) by mouth daily. 90 tablet 1  . Melatonin 5 MG TABS Take 5 mg by mouth at bedtime.    . Multiple Vitamin (MULTIVITAMIN WITH MINERALS) TABS tablet Take 1 tablet by mouth daily.    . nitroGLYCERIN (NITROSTAT) 0.4 MG SL tablet  Place 1 tablet (0.4 mg total) under the tongue every 5 (five) minutes as needed for chest pain (chest pain). 25 tablet 5  . potassium chloride (K-DUR) 10 MEQ tablet Take 1 tablet (10 mEq total) by mouth daily. 90 tablet 1  . propranolol (INDERAL) 20 MG tablet Take 1 tablet (20 mg total) by mouth 2 (two) times daily. 60 tablet 5  . spironolactone (ALDACTONE) 50 MG tablet Take 0.5 tablets (25 mg total) by mouth 2 (two) times daily. 90 tablet 3   No current facility-administered medications for this visit.     Functional Status:  In your present state of health, do you have any difficulty performing the following activities: 02/14/2018 01/25/2018  Hearing? N N  Vision? N N  Difficulty concentrating or making decisions? Y N  Comment - -  Walking or climbing stairs? Y Y  Dressing or bathing? N N  Doing errands, shopping? N N  Some recent data might be hidden    Fall/Depression Screening: Fall Risk  08/21/2018 08/21/2018 06/20/2018  Falls in the past year? 1 0 0  Comment - - -  Number falls in past yr: 0 - -  Injury with Fall? 0 - -  Risk Factor Category  - - -  Risk for fall due to : - - -  Risk for fall due  to: Comment - - -  Follow up - - -   PHQ 2/9 Scores 02/14/2018 01/31/2018 12/22/2017 06/13/2017 11/17/2016 07/19/2016 01/11/2016  PHQ - 2 Score 1 4 6  0 0 1 0  PHQ- 9 Score - 16 18 - - - -    Assessment: Patient will continue to benefit from health coach outreach for disease management and support.  THN CM Care Plan Problem One     Most Recent Value  THN Long Term Goal   In 90 days the patient will maintain his a1c of 6.8   THN Long Term Goal Start Date  08/21/18  Interventions for Problem One Long Term Goal  Reviewed his blood sugar readings, discussed his diet and food choices and medication adherence.     Plan: RN Health Coach will contact patient in the month of May and patient agrees to next outreach.   Lazaro Arms RN, BSN, [place] Direct Dial:  [phone]  Fax: [phone]

## 2018-08-23 ENCOUNTER — Other Ambulatory Visit: Payer: Self-pay | Admitting: Family Medicine

## 2018-09-03 ENCOUNTER — Telehealth: Payer: Self-pay | Admitting: Internal Medicine

## 2018-09-03 NOTE — Telephone Encounter (Signed)
Pt called in and stated that "one of his bowels are bigger than the other" he stated that it is not normal and want to discuss with the nurse. He also advised that its not pain but he has notice that it was swollen .

## 2018-09-03 NOTE — Telephone Encounter (Signed)
Pt states "my right nut it swollen up quite a bit bigger than my left one." Pt reports he is not in any pain but knows this is not right. Pt instructed to call his PCP. Pt verbalized understanding.

## 2018-09-11 ENCOUNTER — Ambulatory Visit (INDEPENDENT_AMBULATORY_CARE_PROVIDER_SITE_OTHER): Payer: PPO | Admitting: Family Medicine

## 2018-09-11 ENCOUNTER — Encounter: Payer: Self-pay | Admitting: Family Medicine

## 2018-09-11 VITALS — BP 138/80 | HR 62 | Temp 98.6°F | Resp 18 | Ht 66.0 in | Wt 255.0 lb

## 2018-09-11 DIAGNOSIS — N5089 Other specified disorders of the male genital organs: Secondary | ICD-10-CM

## 2018-09-11 MED ORDER — CLOTRIMAZOLE-BETAMETHASONE 1-0.05 % EX CREA: 1.0000 "application " | TOPICAL_CREAM | Freq: Two times a day (BID) | CUTANEOUS | 0 refills | Status: DC

## 2018-09-11 NOTE — Progress Notes (Signed)
Subjective:    Patient ID: Adam Butler, male    DOB: 10/16/1955, 63 y.o.   MRN: [id number]  HPI Patient presents today with pain and swelling in his right testicle.  However on examination, the swelling in his scrotum is not the testicle.  The right testicle is normal in size and is symmetric with the left testicle.  There is a large swelling in the scrotum however.  Due to his body habitus is difficult to ascertain whether is a large varicocele which is certainly a possibility given his cirrhosis or possibly a hernia.  When I have the patient lie in a recumbent position on the examination table, the mass retracts back into the inguinal canal suggesting more of a hernia rather than a varicocele.  There is no pain with palpation of the epididymis.  He denies any dysuria or hematuria urgency or frequency.  He denies any penile discharge although he does have a yeastlike rash at the base of his penile shaft as well as in the crease between his scrotum and thigh bilaterally Past Medical History:  Diagnosis Date  . Arthritis   . Cholelithiasis   . Cirrhosis ([place]) 2011   Cryptogenic, Likely NASH. Family/pt deny EtOH. HCV, HBV, HAV negative. ANA negative. AMA positive. Ascites 12/11  . Coronary artery disease    Inferior MI 12/11; LHC with occluded mid CFX and 80% proximal RCA. EF 55%. He had 3.0 x 28 vision BMS to CFX  . Diastolic CHF, acute ([place])    Echo 12/11 with ef 50-55% and mild LVH. EF 55% by LV0gram in 12/11  . Esophageal varices ([place]) 2011, 2013   no hx acute variceal bleed  . Hepatic encephalopathy ([place]) 2011, 12/2013  . Hyperlipemia   . Myocardial infarction [facility]) ? 2012  . PFO (patent foramen ovale): Per TEE 04/20/2015 04/20/2015  . Portal hypertension ([place])   . Rectal varices   . S/P coronary artery stent placement 06/2010  . SVT (supraventricular tachycardia) ([place])    1/12: appeared to be an ectopic atrial tachycardia. Required DCCV with hemodynamic instability  . Type II  diabetes mellitus ([place]) 2011   Past Surgical History:  Procedure Laterality Date  . APPENDECTOMY    . BIOPSY  01/02/2018   Procedure: BIOPSY;  Surgeon: Jerene Bears, MD;  Location: [facility] ENDOSCOPY;  Service: Gastroenterology;;  . CARDIAC CATHETERIZATION  07/01/2010   BMS to CFX.  Marland Kitchen CATARACT EXTRACTION W/PHACO Left 04/28/2016   Procedure: CATARACT EXTRACTION PHACO AND INTRAOCULAR LENS PLACEMENT (IOC);  Surgeon: Leandrew Koyanagi, MD;  Location: [facility] ORS;  Service: Ophthalmology;  Laterality: Left;  Lot# [id number] H [place]: 05:31.7 AP%: 28.2 CDE: 93.48   . COLONOSCOPY  04/13/2012   Procedure: COLONOSCOPY;  Surgeon: Inda Castle, MD;  Location: [facility];  Service: Endoscopy;  Laterality: N/A;  . COLONOSCOPY WITH PROPOFOL N/A 03/22/2017   Procedure: COLONOSCOPY WITH PROPOFOL;  Surgeon: Doran Stabler, MD;  Location: [facility];  Service: Gastroenterology;  Laterality: N/A;  . COLONOSCOPY WITH PROPOFOL N/A 01/02/2018   Procedure: COLONOSCOPY WITH PROPOFOL;  Surgeon: Jerene Bears, MD;  Location: [facility];  Service: Gastroenterology;  Laterality: N/A;  . CORONARY ANGIOPLASTY    . CORONARY STENT PLACEMENT  06/30/2010   CFX   Distal        . ESOPHAGEAL BANDING N/A 01/01/2014   Procedure: ESOPHAGEAL BANDING;  Surgeon: Jerene Bears, MD;  Location: [facility]OSCOPY;  Service: Endoscopy;  Laterality: N/A;  . ESOPHAGOGASTRODUODENOSCOPY  04/13/2012  Procedure: ESOPHAGOGASTRODUODENOSCOPY (EGD);  Surgeon: Inda Castle, MD;  Location: [facility];  Service: Endoscopy;  Laterality: N/A;  . ESOPHAGOGASTRODUODENOSCOPY N/A 01/01/2014   Procedure: ESOPHAGOGASTRODUODENOSCOPY (EGD);  Surgeon: Jerene Bears, MD;  Location: [facility] ENDOSCOPY;  Service: Endoscopy;  Laterality: N/A;  . ESOPHAGOGASTRODUODENOSCOPY (EGD) WITH PROPOFOL N/A 07/12/2016   Procedure: ESOPHAGOGASTRODUODENOSCOPY (EGD) WITH PROPOFOL;  Surgeon: Jerene Bears, MD;  Location: [facility];  Service: Gastroenterology;  Laterality: N/A;  .  ESOPHAGOGASTRODUODENOSCOPY (EGD) WITH PROPOFOL N/A 01/02/2018   Procedure: ESOPHAGOGASTRODUODENOSCOPY (EGD) WITH PROPOFOL;  Surgeon: Jerene Bears, MD;  Location: [facility];  Service: Gastroenterology;  Laterality: N/A;  . LEFT HEART CATHETERIZATION WITH CORONARY ANGIOGRAM N/A 10/15/2014   Procedure: LEFT HEART CATHETERIZATION WITH CORONARY ANGIOGRAM;  Surgeon: Peter M Martinique, MD;  Location: [facility] CATH LAB;  Service: Cardiovascular;  Laterality: N/A;  . orif r leg    . POLYPECTOMY  01/02/2018   Procedure: POLYPECTOMY;  Surgeon: Jerene Bears, MD;  Location: [facility];  Service: Gastroenterology;;  . TEE WITHOUT CARDIOVERSION N/A 04/20/2015   Procedure: TRANSESOPHAGEAL ECHOCARDIOGRAM (TEE);  Surgeon: Fay Records, MD;  Location: [place];  Service: Cardiovascular;  Laterality: N/A;  . TEE WITHOUT CARDIOVERSION N/A 05/17/2017   Procedure: TRANSESOPHAGEAL ECHOCARDIOGRAM (TEE);  Surgeon: Josue Hector, MD;  Location: [facility] ENDOSCOPY;  Service: Cardiovascular;  Laterality: N/A;   Current Outpatient Medications on File Prior to Visit  Medication Sig Dispense Refill  . cyanocobalamin 1000 MCG tablet Take 1,000 mcg by mouth daily. Vitamin B12    . furosemide (LASIX) 20 MG tablet Take 1 tablet (20 mg total) by mouth daily. (Patient taking differently: Take 40 mg by mouth daily. ) 90 tablet 1  . insulin regular (NOVOLIN R,HUMULIN R) 100 units/mL injection Inject 2-8 Units into the skin 3 (three) times daily as needed for high blood sugar (CBG >150).     . INSULIN SYRINGE 1CC/29G (SAFETY INSULIN SYRINGES) 29G X 1/2" 1 ML MISC 1 application by Does not apply route daily. 30 each 0  . lactulose (CHRONULAC) 10 GM/15ML solution Take 45 mLs (30 g total) by mouth 3 (three) times daily. 1892 mL 3  . levofloxacin (LEVAQUIN) 500 MG tablet TAKE 1 TABLET BY MOUTH ONCE DAILY (Patient taking differently: Take 500 mg by mouth daily. Will take daily for infection) 30 tablet 3  . magnesium oxide (MAG-OX) 400 MG tablet  Take 1 tablet (400 mg total) by mouth daily. 90 tablet 1  . Melatonin 5 MG TABS Take 5 mg by mouth at bedtime.    . Multiple Vitamin (MULTIVITAMIN WITH MINERALS) TABS tablet Take 1 tablet by mouth daily.    . nitroGLYCERIN (NITROSTAT) 0.4 MG SL tablet Place 1 tablet (0.4 mg total) under the tongue every 5 (five) minutes as needed for chest pain (chest pain). 25 tablet 5  . potassium chloride (K-DUR) 10 MEQ tablet Take 1 tablet (10 mEq total) by mouth daily. 90 tablet 1  . propranolol (INDERAL) 20 MG tablet Take 1 tablet (20 mg total) by mouth 2 (two) times daily. 60 tablet 5  . spironolactone (ALDACTONE) 50 MG tablet Take 0.5 tablets (25 mg total) by mouth 2 (two) times daily. 90 tablet 3   No current facility-administered medications on file prior to visit.    Allergies  Allergen Reactions  . Isosorbide Nitrate Other (See Comments)    Nose bleeds   Social History   Socioeconomic History  . Marital status: Married    Spouse name: Not on file  .  Number of children: 3  . Years of education: Not on file  . Highest education level: Not on file  Occupational History  . Occupation: Merchandiser, retail: OTHER    Comment: Worked in Theatre manager prior  Social Needs  . Financial resource strain: Not on file  . Food insecurity:    Worry: Not on file    Inability: Not on file  . Transportation needs:    Medical: Not on file    Non-medical: Not on file  Tobacco Use  . Smoking status: Current Every Day Smoker    Packs/day: 0.25    Years: 36.00    Pack years: 9.00    Types: E-cigarettes, Cigarettes  . Smokeless tobacco: Never Used  . Tobacco comment: uses vapor cigarettes (2016 ); I smoke about 4 cigarettes a day  Substance and Sexual Activity  . Alcohol use: No    Alcohol/week: 0.0 standard drinks  . Drug use: No  . Sexual activity: Never  Lifestyle  . Physical activity:    Days per week: Not on file    Minutes per session: Not on file  . Stress: Not on file  Relationships    . Social connections:    Talks on phone: Not on file    Gets together: Not on file    Attends religious service: Not on file    Active member of club or organization: Not on file    Attends meetings of clubs or organizations: Not on file    Relationship status: Not on file  . Intimate partner violence:    Fear of current or ex partner: Not on file    Emotionally abused: Not on file    Physically abused: Not on file    Forced sexual activity: Not on file  Other Topics Concern  . Not on file  Social History Narrative   Married   Gets regular exercise: walking    Review of Systems  All other systems reviewed and are negative.      Objective:   Physical Exam Vitals signs reviewed.  Constitutional:      General: He is not in acute distress.    Appearance: He is well-developed. He is not diaphoretic.  Neck:     Vascular: No JVD.  Cardiovascular:     Rate and Rhythm: Normal rate and regular rhythm.     Heart sounds: Murmur present.  Pulmonary:     Effort: No respiratory distress.     Breath sounds: No rales.  Abdominal:     General: Bowel sounds are normal. There is distension.     Palpations: Abdomen is soft. There is no mass.     Tenderness: There is no abdominal tenderness. There is no guarding or rebound.     Hernia: A hernia is present. Hernia is present in the right inguinal area. There is no hernia in the left inguinal area.  Genitourinary:    Penis: Uncircumcised. Erythema present.      Scrotum/Testes:        Right: Varicocele present. Mass not present.        Left: Mass not present.     Epididymis:     Right: Normal.     Left: Normal.  Lymphadenopathy:     Lower Body: No right inguinal adenopathy. No left inguinal adenopathy.           Assessment & Plan:  Scrotal mass - Plan: US Scrotum  Honestly I cannot determine if this is a large  varicocele versus an inguinal hernia causing the mass and swelling in his right inguinal canal.  I will use a scrotal  ultrasound to evaluate further.  There is no testicular mass.  There is no testicular pain.  There is no evidence of orchitis or epididymitis.  Therefore I will await the results of the ultrasound to determine treatment options.  Patient will be a poor surgical candidate and therefore if it is a benign varicocele I would recommend no treatment.  I will defer his judgment on the hernia if there is in fact a hernia which is what my clinical suspicion is.  I will treat the yeastlike rash with Lotrisone cream twice daily for 10 days

## 2018-09-13 ENCOUNTER — Ambulatory Visit
Admission: RE | Admit: 2018-09-13 | Discharge: 2018-09-13 | Disposition: A | Discharge status: Home / Self Care | Payer: PPO | Source: Ambulatory Visit | Attending: Family Medicine | Admitting: Family Medicine

## 2018-09-13 DIAGNOSIS — I861 Scrotal varices: Secondary | ICD-10-CM | POA: Diagnosis not present

## 2018-09-13 DIAGNOSIS — N5089 Other specified disorders of the male genital organs: Secondary | ICD-10-CM

## 2018-09-15 IMAGING — DX DG FOOT COMPLETE 3+V*R*
3 series · 3 of 3 positions shown · non-contrast
Comparison: None.

CLINICAL DATA: Acute foot pain.  Fall

EXAM:
RIGHT FOOT COMPLETE - 3+ VIEW

[foot ap]
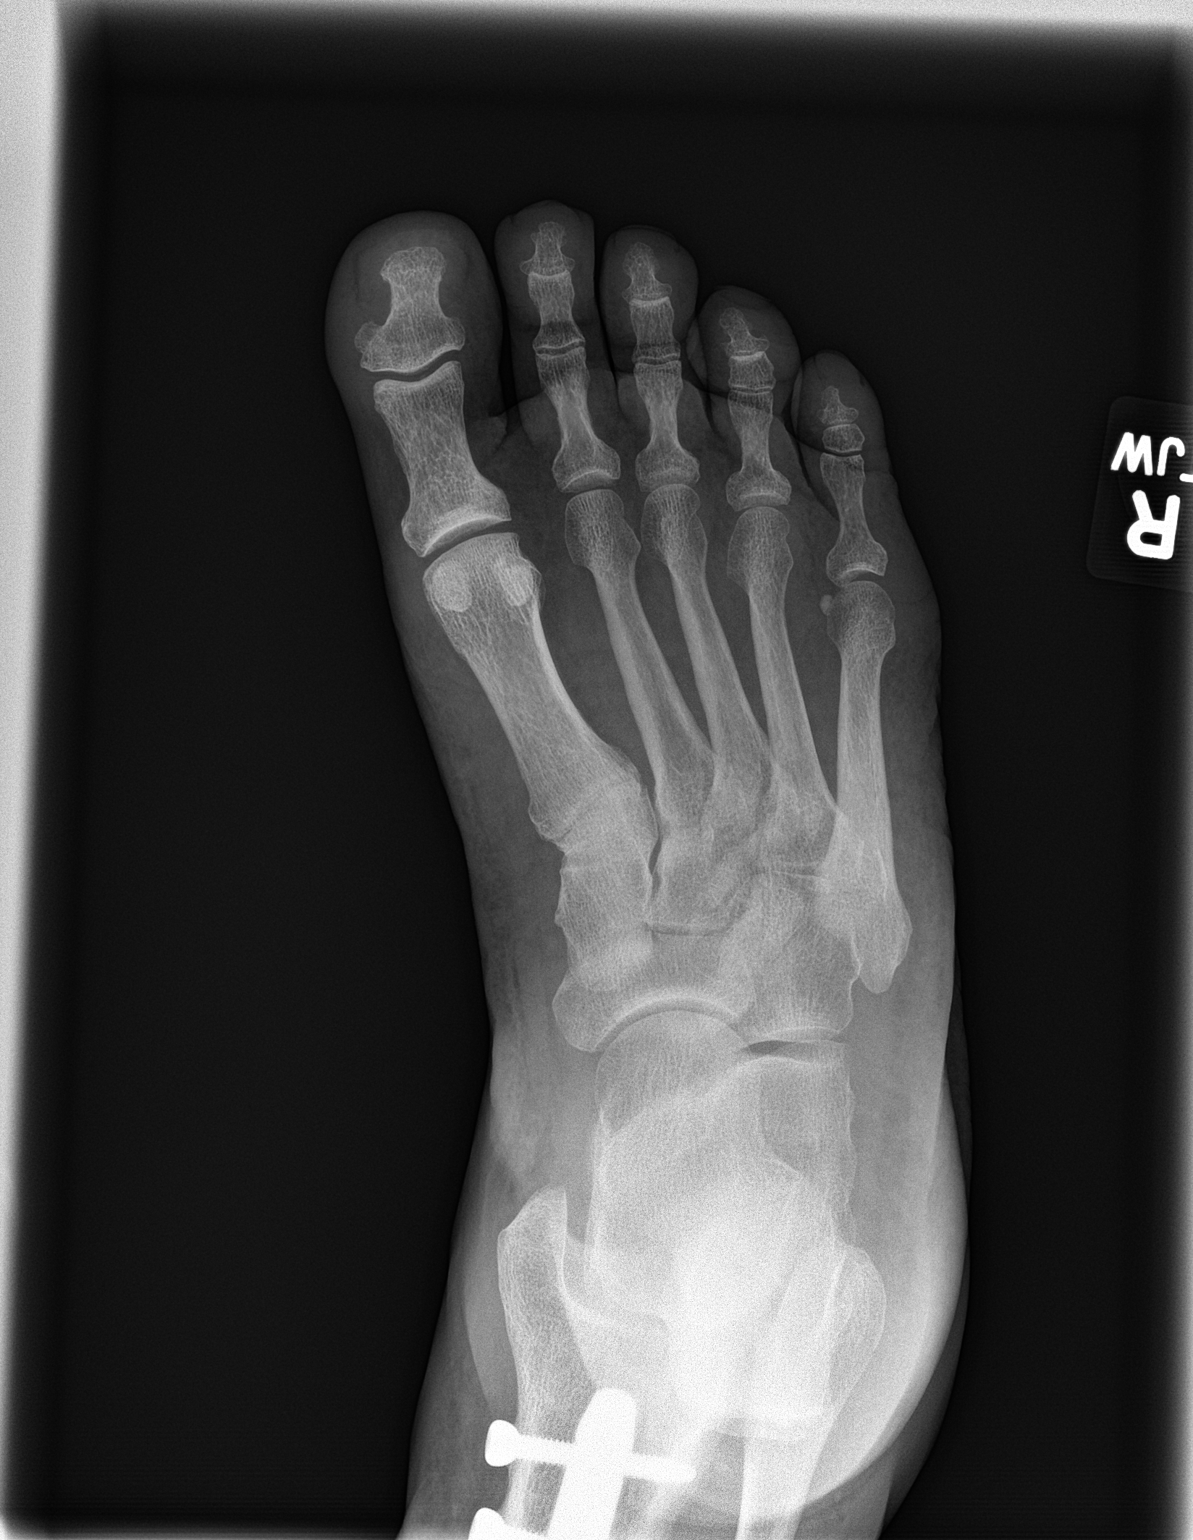

[foot obl]
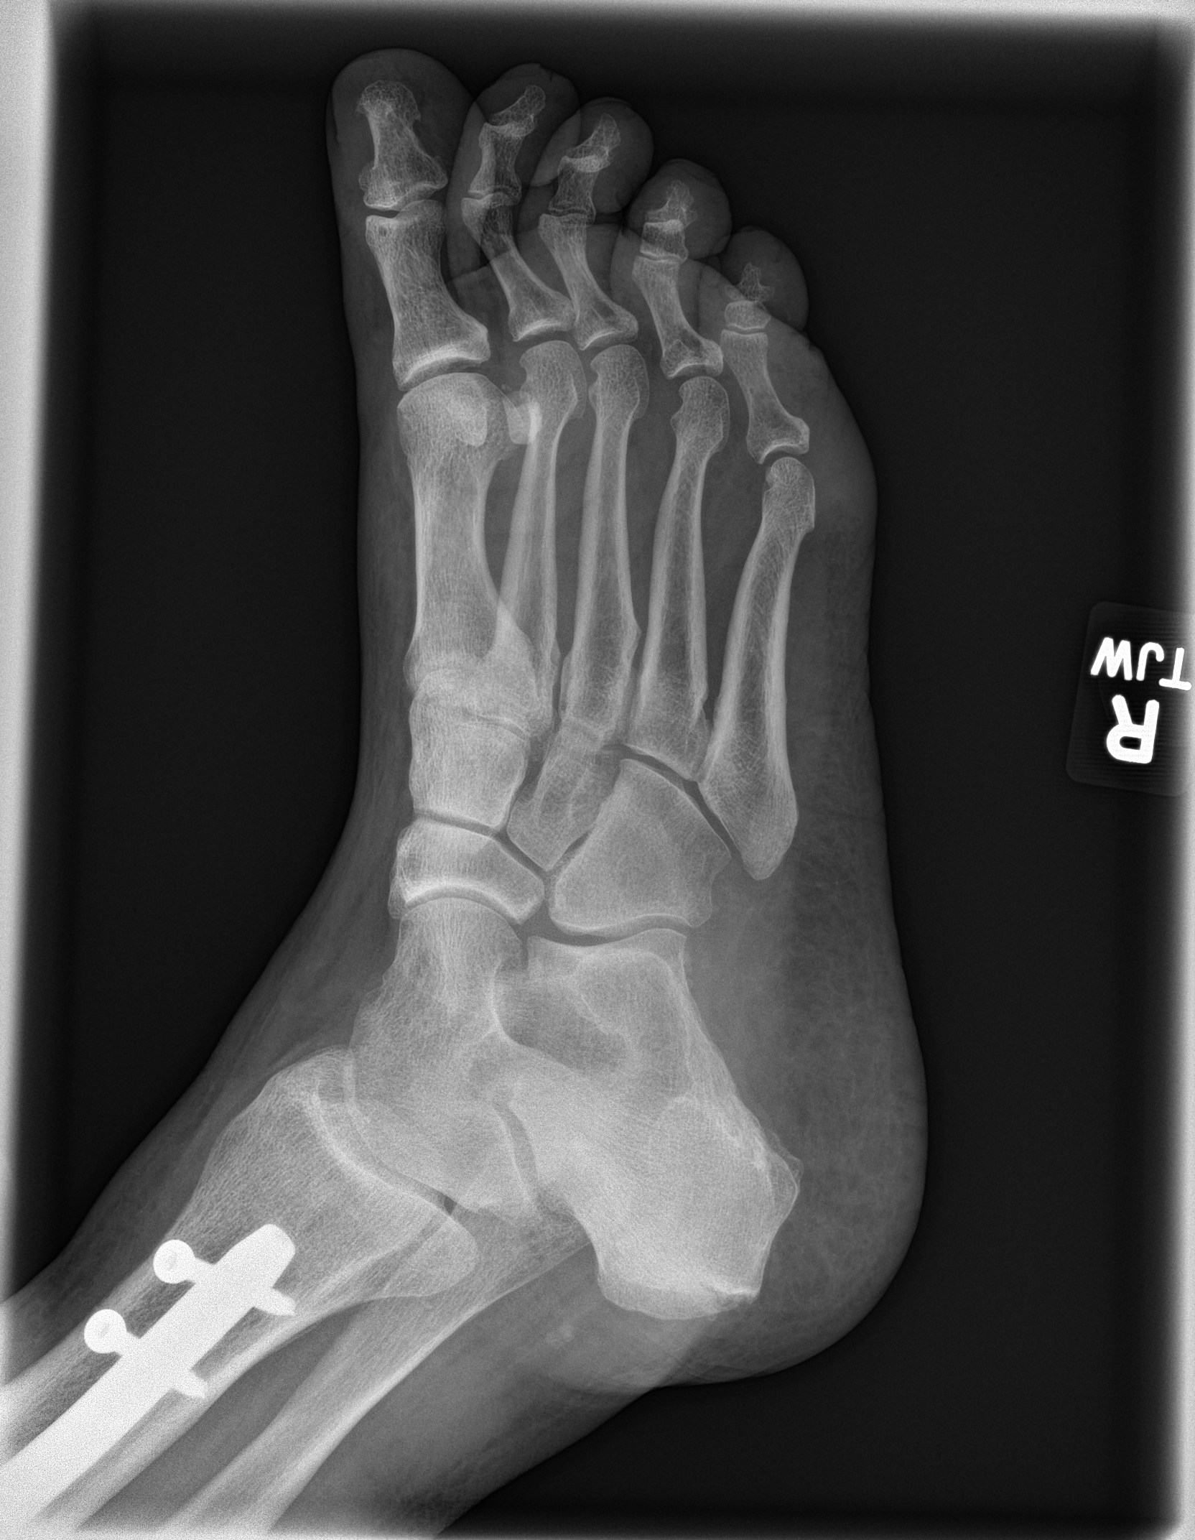

[foot lat]
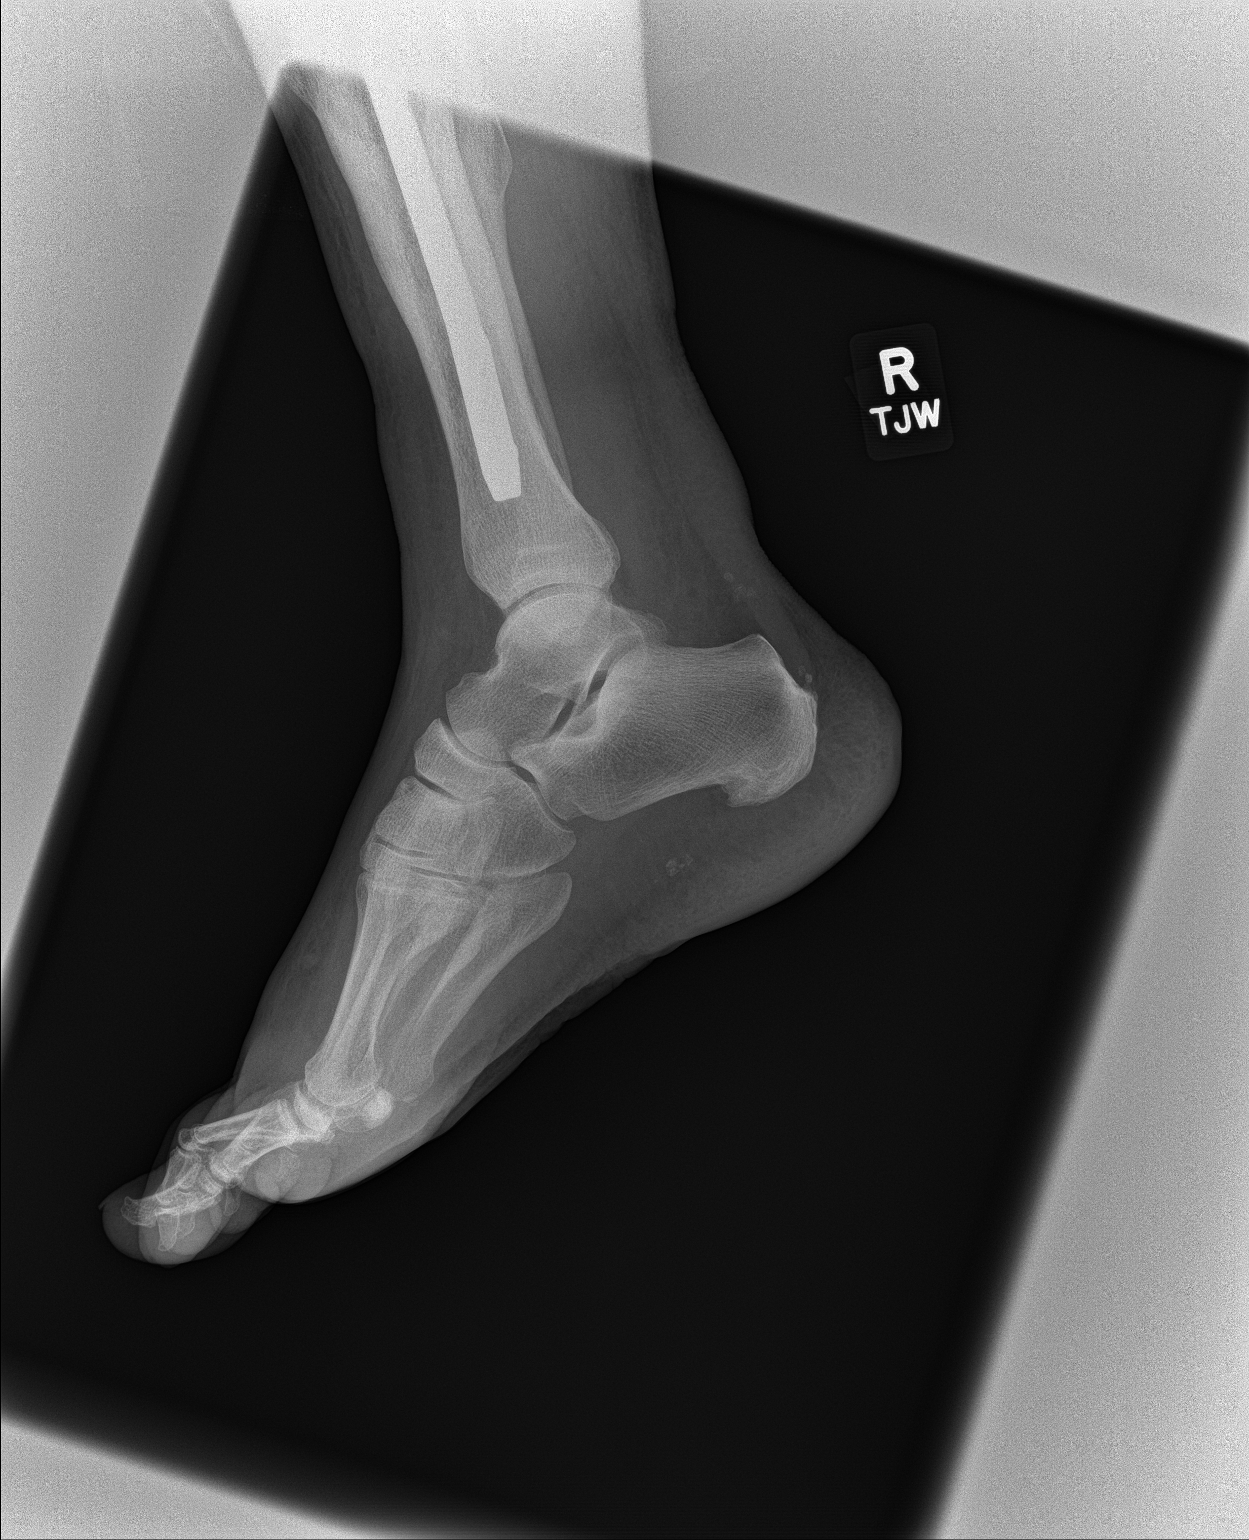

[3 of 3 positions shown; findings below may reference images not displayed]

FINDINGS: Negative for fracture. No degenerative change or erosion. Diffuse
soft tissue swelling of the foot and ankle.

Right fixation of chronic tibial fracture
IMPRESSION: Negative right foot

## 2018-09-17 DIAGNOSIS — K4090 Unilateral inguinal hernia, without obstruction or gangrene, not specified as recurrent: Secondary | ICD-10-CM

## 2018-09-20 ENCOUNTER — Ambulatory Visit (INDEPENDENT_AMBULATORY_CARE_PROVIDER_SITE_OTHER): Payer: PPO | Admitting: Surgery

## 2018-09-20 ENCOUNTER — Encounter: Payer: Self-pay | Admitting: Surgery

## 2018-09-20 ENCOUNTER — Other Ambulatory Visit: Payer: Self-pay

## 2018-09-20 VITALS — BP 123/65 | HR 61 | Temp 97.3°F | Ht 66.0 in | Wt 253.0 lb

## 2018-09-20 DIAGNOSIS — R188 Other ascites: Secondary | ICD-10-CM | POA: Diagnosis not present

## 2018-09-20 NOTE — Progress Notes (Signed)
Surgical Clinic History and Physical  Referring provider:  Susy Frizzle, MD [address] [facility] [place], [place] [address]  HISTORY OF PRESENT ILLNESS (HPI):  63 y.o. male presents for evaluation of Right > Left scrotal swelling. Patient reports he first noted this 1 month ago and describes that his scrotum becomes larger when he stands for extended periods of time and resolves when he lays flat. He denies any Right groin pain and denies worsening of his scrotal swelling with coughing or heavy lifting. He also introduced himself by stating, "My doctor told me I would die if I have surgery." He recalls he was admitted to an ICU x 23 days seven years ago and told he was unlikely to survive another month at his time of discharge. It was at that time that he says he had an MI and was diagnosed with diabetes and non-alcoholic hepatic cirrhosis. He says his shaking hands tremor and forgetfulness have improved with lactulose, which causes him have more frequent loose BM's. He also inquires whether he needs to avoid sex due to his scrotal swelling. He denies any other abdominal pain, recent fever/chills, CP, or SOB.  PAST MEDICAL HISTORY (PMH):  Past Medical History:  Diagnosis Date  . Arthritis   . Cholelithiasis   . Cirrhosis ([place]) 2011   Cryptogenic, Likely NASH. Family/pt deny EtOH. HCV, HBV, HAV negative. ANA negative. AMA positive. Ascites 12/11  . Coronary artery disease    Inferior MI 12/11; LHC with occluded mid CFX and 80% proximal RCA. EF 55%. He had 3.0 x 28 vision BMS to CFX  . Diastolic CHF, acute ([place])    Echo 12/11 with ef 50-55% and mild LVH. EF 55% by LV0gram in 12/11  . Esophageal varices ([place]) 2011, 2013   no hx acute variceal bleed  . Hepatic encephalopathy ([place]) 2011, 12/2013  . Hyperlipemia   . Myocardial infarction [facility]) ? 2012  . PFO (patent foramen ovale): Per TEE 04/20/2015 04/20/2015  . Portal hypertension ([place])   . Rectal varices   . S/P coronary artery stent  placement 06/2010  . SVT (supraventricular tachycardia) ([place])    1/12: appeared to be an ectopic atrial tachycardia. Required DCCV with hemodynamic instability  . Type II diabetes mellitus ([place]) 2011    PAST SURGICAL HISTORY [facility]):  Past Surgical History:  Procedure Laterality Date  . APPENDECTOMY    . BIOPSY  01/02/2018   Procedure: BIOPSY;  Surgeon: Jerene Bears, MD;  Location: [facility] ENDOSCOPY;  Service: Gastroenterology;;  . CARDIAC CATHETERIZATION  07/01/2010   BMS to CFX.  Marland Kitchen CATARACT EXTRACTION W/PHACO Left 04/28/2016   Procedure: CATARACT EXTRACTION PHACO AND INTRAOCULAR LENS PLACEMENT (IOC);  Surgeon: Leandrew Koyanagi, MD;  Location: [facility] ORS;  Service: Ophthalmology;  Laterality: Left;  Lot# [id number] H [place]: 05:31.7 AP%: 28.2 CDE: 93.48   . COLONOSCOPY  04/13/2012   Procedure: COLONOSCOPY;  Surgeon: Inda Castle, MD;  Location: [facility] ENDOSCOPY;  Service: Endoscopy;  Laterality: N/A;  . COLONOSCOPY WITH PROPOFOL N/A 03/22/2017   Procedure: COLONOSCOPY WITH PROPOFOL;  Surgeon: Doran Stabler, MD;  Location: [facility] ENDOSCOPY;  Service: Gastroenterology;  Laterality: N/A;  . COLONOSCOPY WITH PROPOFOL N/A 01/02/2018   Procedure: COLONOSCOPY WITH PROPOFOL;  Surgeon: Jerene Bears, MD;  Location: [facility] ENDOSCOPY;  Service: Gastroenterology;  Laterality: N/A;  . CORONARY ANGIOPLASTY    . CORONARY STENT PLACEMENT  06/30/2010   CFX   Distal        . ESOPHAGEAL BANDING N/A 01/01/2014  Procedure: ESOPHAGEAL BANDING;  Surgeon: Jerene Bears, MD;  Location: [facility] ENDOSCOPY;  Service: Endoscopy;  Laterality: N/A;  . ESOPHAGOGASTRODUODENOSCOPY  04/13/2012   Procedure: ESOPHAGOGASTRODUODENOSCOPY (EGD);  Surgeon: Inda Castle, MD;  Location: [facility];  Service: Endoscopy;  Laterality: N/A;  . ESOPHAGOGASTRODUODENOSCOPY N/A 01/01/2014   Procedure: ESOPHAGOGASTRODUODENOSCOPY (EGD);  Surgeon: Jerene Bears, MD;  Location: [facility] ENDOSCOPY;  Service: Endoscopy;  Laterality: N/A;  . ESOPHAGOGASTRODUODENOSCOPY  (EGD) WITH PROPOFOL N/A 07/12/2016   Procedure: ESOPHAGOGASTRODUODENOSCOPY (EGD) WITH PROPOFOL;  Surgeon: Jerene Bears, MD;  Location: [facility] ENDOSCOPY;  Service: Gastroenterology;  Laterality: N/A;  . ESOPHAGOGASTRODUODENOSCOPY (EGD) WITH PROPOFOL N/A 01/02/2018   Procedure: ESOPHAGOGASTRODUODENOSCOPY (EGD) WITH PROPOFOL;  Surgeon: Jerene Bears, MD;  Location: [facility] ENDOSCOPY;  Service: Gastroenterology;  Laterality: N/A;  . LEFT HEART CATHETERIZATION WITH CORONARY ANGIOGRAM N/A 10/15/2014   Procedure: LEFT HEART CATHETERIZATION WITH CORONARY ANGIOGRAM;  Surgeon: Peter M Martinique, MD;  Location: [facility] CATH LAB;  Service: Cardiovascular;  Laterality: N/A;  . orif r leg    . POLYPECTOMY  01/02/2018   Procedure: POLYPECTOMY;  Surgeon: Jerene Bears, MD;  Location: [facility] ENDOSCOPY;  Service: Gastroenterology;;  . TEE WITHOUT CARDIOVERSION N/A 04/20/2015   Procedure: TRANSESOPHAGEAL ECHOCARDIOGRAM (TEE);  Surgeon: Fay Records, MD;  Location: [facility];  Service: Cardiovascular;  Laterality: N/A;  . TEE WITHOUT CARDIOVERSION N/A 05/17/2017   Procedure: TRANSESOPHAGEAL ECHOCARDIOGRAM (TEE);  Surgeon: Josue Hector, MD;  Location: [facility] ENDOSCOPY;  Service: Cardiovascular;  Laterality: N/A;    MEDICATIONS:  Prior to Admission medications   Medication Sig Start Date End Date Taking? Authorizing Provider  clotrimazole-betamethasone (LOTRISONE) cream Apply 1 application topically 2 (two) times daily. 09/11/18  Yes Susy Frizzle, MD  cyanocobalamin 1000 MCG tablet Take 1,000 mcg by mouth daily. Vitamin B12   Yes [provider]  furosemide (LASIX) 20 MG tablet Take 1 tablet (20 mg total) by mouth daily. Patient taking differently: Take 40 mg by mouth daily.  02/14/18  Yes Delsa Grana, PA-C  insulin regular (NOVOLIN R,HUMULIN R) 100 units/mL injection Inject 2-8 Units into the skin 3 (three) times daily as needed for high blood sugar (CBG >150).    Yes [provider]  INSULIN SYRINGE 1CC/29G (SAFETY  INSULIN SYRINGES) 29G X 1/2" 1 ML MISC 1 application by Does not apply route daily. 01/27/18  Yes Regalado, Belkys A, MD  lactulose (CHRONULAC) 10 GM/15ML solution Take 45 mLs (30 g total) by mouth 3 (three) times daily. 02/22/18  Yes Delsa Grana, PA-C  levofloxacin (LEVAQUIN) 500 MG tablet TAKE 1 TABLET BY MOUTH ONCE DAILY Patient taking differently: Take 500 mg by mouth daily. Will take daily for infection 08/23/18  Yes Pickard, Cammie Mcgee, MD  magnesium oxide (MAG-OX) 400 MG tablet Take 1 tablet (400 mg total) by mouth daily. 03/02/18  Yes Susy Frizzle, MD  Melatonin 5 MG TABS Take 5 mg by mouth at bedtime.   Yes [provider]  Multiple Vitamin (MULTIVITAMIN WITH MINERALS) TABS tablet Take 1 tablet by mouth daily.   Yes [provider]  nitroGLYCERIN (NITROSTAT) 0.4 MG SL tablet Place 1 tablet (0.4 mg total) under the tongue every 5 (five) minutes as needed for chest pain (chest pain). 07/19/16  Yes Baity, Coralie Keens, NP  potassium chloride (K-DUR) 10 MEQ tablet Take 1 tablet (10 mEq total) by mouth daily. 03/02/18  Yes Susy Frizzle, MD  propranolol (INDERAL) 20 MG tablet Take 1 tablet (20 mg total) by mouth 2 (  two) times daily. 08/10/18  Yes Susy Frizzle, MD  spironolactone (ALDACTONE) 50 MG tablet Take 0.5 tablets (25 mg total) by mouth 2 (two) times daily. 05/28/18  Yes Susy Frizzle, MD    ALLERGIES:  Allergies  Allergen Reactions  . Isosorbide Nitrate Other (See Comments)    Nose bleeds    SOCIAL HISTORY:  Social History   Socioeconomic History  . Marital status: Married    Spouse name: Not on file  . Number of children: 3  . Years of education: Not on file  . Highest education level: Not on file  Occupational History  . Occupation: Merchandiser, retail: OTHER    Comment: Worked in Theatre manager prior  Social Needs  . Financial resource strain: Not on file  . Food insecurity:    Worry: Not on file    Inability: Not on file  . Transportation  needs:    Medical: Not on file    Non-medical: Not on file  Tobacco Use  . Smoking status: Current Every Day Smoker    Packs/day: 0.25    Years: 36.00    Pack years: 9.00    Types: E-cigarettes, Cigarettes  . Smokeless tobacco: Never Used  . Tobacco comment: uses vapor cigarettes (2016 ); I smoke about 4 cigarettes a day  Substance and Sexual Activity  . Alcohol use: No    Alcohol/week: 0.0 standard drinks  . Drug use: No  . Sexual activity: Yes  Lifestyle  . Physical activity:    Days per week: Not on file    Minutes per session: Not on file  . Stress: Not on file  Relationships  . Social connections:    Talks on phone: Not on file    Gets together: Not on file    Attends religious service: Not on file    Active member of club or organization: Not on file    Attends meetings of clubs or organizations: Not on file    Relationship status: Not on file  . Intimate partner violence:    Fear of current or ex partner: Not on file    Emotionally abused: Not on file    Physically abused: Not on file    Forced sexual activity: Not on file  Other Topics Concern  . Not on file  Social History Narrative   Married   Gets regular exercise: walking    The patient currently resides (home / rehab facility / nursing home): Home The patient normally is (ambulatory / bedbound): Ambulatory  FAMILY HISTORY:  Family History  Problem Relation Age of Onset  . Heart attack Brother 3       MI  . Diabetes Brother   . Heart attack Father   . Diabetes Father   . COPD Father   . COPD Mother   . Heart attack Brother   . Stroke Neg Hx     Otherwise negative/non-contributory.  REVIEW OF SYSTEMS:  Constitutional: denies any other weight loss, fever, chills, or sweats  Eyes: denies any other vision changes, history of eye injury  ENT: denies sore throat, hearing problems  Respiratory: denies shortness of breath, wheezing  Cardiovascular: denies chest pain, palpitations  Gastrointestinal:  abdominal pain, N/V, and bowel function as per HPI Musculoskeletal: denies any other joint pains or cramps  Skin: Denies any other rashes or skin discolorations except as per HPI Neurological: denies any other headache, dizziness, weakness  Psychiatric: Denies any other depression, anxiety   All other review  of systems were otherwise negative   VITAL SIGNS:  BP 123/65   Pulse 61   Temp (!) 97.3 F (36.3 C) (Temporal)   Ht 5' 6"  (1.676 m)   Wt 253 lb (114.8 kg)   SpO2 98%   BMI 40.84 kg/m   PHYSICAL EXAM:  Constitutional:  -- Obese body habitus  -- Awake, alert, and oriented x3  Eyes:  -- Pupils equally round and reactive to light  -- No scleral icterus  Ear, nose, throat:  -- No jugular venous distension -- No nasal drainage, bleeding Pulmonary:  -- No crackles  -- Equal breath sounds bilaterally -- Breathing non-labored at rest Cardiovascular:  -- S1, S2 present  -- No pericardial rubs  Gastrointestinal:  -- Abdomen soft, non-tender to palpation, and non-distended with no guarding/rebound tenderness -- Non-tender to palpation Right > Left scrotal swelling, which resolves upon laying supine and without any appreciable Right inguinal or umbilical hernias, including with coughing and sitting up -- No other abdominal masses appreciated, pulsatile or otherwise  Musculoskeletal and Integumentary:  -- Wounds or skin discoloration: None appreciated -- Extremities: B/L UE and LE FROM, hands and feet warm  Neurologic:  -- Motor function: Intact and symmetric -- Sensation: Intact and symmetric  Labs:  CBC Latest Ref Rng & Units 04/17/2018 03/02/2018 02/15/2018  WBC 4.0 - 10.5 K/uL 5.4 5.6 4.3  Hemoglobin 13.0 - 17.0 g/dL 12.1(L) 12.5(L) 11.0(L)  Hematocrit 39.0 - 52.0 % 35.3(L) 35.8(L) 32.3(L)  Platelets 150.0 - 400.0 K/uL 51.0(L) 52(L) 46(L)   CMP Latest Ref Rng & Units 04/17/2018 03/02/2018 02/23/2018  Glucose 70 - 99 mg/dL 252(H) 147(H) 170(H)  BUN 6 - 23 mg/dL 11 13 10    Creatinine 0.40 - 1.50 mg/dL 0.84 0.74 0.69(L)  Sodium 135 - 145 mEq/L 134(L) 139 137  Potassium 3.5 - 5.1 mEq/L 4.0 3.4(L) 3.4(L)  Chloride 96 - 112 mEq/L 104 107 105  CO2 19 - 32 mEq/L 25 23 24   Calcium 8.4 - 10.5 mg/dL 8.0(L) 8.3(L) 7.8(L)  Total Protein 6.0 - 8.3 g/dL 6.8 6.9 6.5  Total Bilirubin 0.2 - 1.2 mg/dL 2.5(H) 2.8(H) 3.2(H)  Alkaline Phos 39 - 117 U/L 195(H) - -  AST 0 - 37 U/L 62(H) 60(H) 54(H)  ALT 0 - 53 U/L 34 35 33   Imaging studies:  Scrotal Ultrasound with Doppler (09/13/2018) 1. Large complex cystic and solid mass within the RIGHT inguinal region, measuring 9.9 x 3.5 x 4.8 cm. This is of uncertain etiology and there is no corresponding finding seen in this area on earlier CT of 05/13/2017. This may represent a new inguinal hernia and/or extension of ascites from the pelvis. Recommend CT of the abdomen and pelvis for further characterization. 2. Both testicles appear normal. No testicular mass. No evidence of testicular torsion or orchitis. No evidence of epididymitis. 3. Bilateral varicoceles.  CT Abdomen and Pelvis without contrast (05/13/2017) - personally reviewed with patient bedside 1. Nonobstructing stone inferior pole left kidney. No suspicious enhancing renal masses are identified. No filling defects identified within the opacified renal collecting systems and ureters. 2. Cirrhosis of the liver with portal venous hypertension, esophageal varices and splenomegaly. 3. Wall thickening of the small and large bowel which is likely secondary to cirrhosis and portal venous hypertension. Underlying enteritis is not excluded. 4. Moderate volume ascites.   Assessment/Plan:  63 y.o. male with most likely pelvic ascites draining into patient's scrotum without any appreciable inguinal hernia on exam, complicated by extensive significant co-morbidities including morbid  obesity (BMI 41), DM, HTN, HLD, CAD s/p PCI for MI with chronic diastolic CHF, patent foramen ovale  (7356), non-alcoholic hepatic cirrhosis (NASH) with moderate ascites (2018) and encephalopathy managed with lactulose, esophageal and rectal varices, osteoarthritis, and chronic ongoing tobacco abuse (smoking).   - differential diagnoses discussed   - CT imaging findings, specifically moderate ascites, reviewed with patient  - currently no indication for surgical intervention at this time, which is good considering patient's apparent surgical risk for an elective surgical procedure  - also discussed with patient signs and symptoms of inguinal and umbilical hernias, incarceration, and strangulation   - return to clinic as needed, instructed to call if any questions or concerns  - smoking cessation encouraged  All of the above recommendations were discussed with the patient, and all of patient's questions were answered to his expressed satisfaction.  Thank you for the opportunity to participate in this patient's care.  -- Marilynne Drivers Rosana Hoes, MD, [place]: [facility] - Partnering for exceptional care. Office: [phone]

## 2018-09-20 NOTE — Patient Instructions (Addendum)
Patient will need to return to the office as needed.    Call the office with any questions or concerns. 

## 2018-09-21 ENCOUNTER — Encounter: Payer: Self-pay | Admitting: Surgery

## 2018-10-05 ENCOUNTER — Telehealth: Payer: Self-pay

## 2018-10-05 NOTE — Telephone Encounter (Signed)
Pts appt needs to be moved from 4/3 to 4/2@3pm . Called pt and received message that voicemail box is full. Will try again to reach pt and change his visit to a virtual visit.

## 2018-10-11 ENCOUNTER — Other Ambulatory Visit: Payer: Self-pay

## 2018-10-11 ENCOUNTER — Ambulatory Visit: Payer: PPO | Admitting: Internal Medicine

## 2018-10-11 ENCOUNTER — Encounter: Payer: Self-pay | Admitting: Internal Medicine

## 2018-10-11 ENCOUNTER — Other Ambulatory Visit (INDEPENDENT_AMBULATORY_CARE_PROVIDER_SITE_OTHER): Payer: PPO

## 2018-10-11 VITALS — BP 120/76 | HR 68 | Ht 66.0 in | Wt 254.2 lb

## 2018-10-11 DIAGNOSIS — K7460 Unspecified cirrhosis of liver: Secondary | ICD-10-CM

## 2018-10-11 DIAGNOSIS — F331 Major depressive disorder, recurrent, moderate: Secondary | ICD-10-CM | POA: Diagnosis not present

## 2018-10-11 DIAGNOSIS — R188 Other ascites: Secondary | ICD-10-CM

## 2018-10-11 DIAGNOSIS — K766 Portal hypertension: Secondary | ICD-10-CM | POA: Diagnosis not present

## 2018-10-11 DIAGNOSIS — H00014 Hordeolum externum left upper eyelid: Secondary | ICD-10-CM | POA: Diagnosis not present

## 2018-10-11 DIAGNOSIS — I10 Essential (primary) hypertension: Secondary | ICD-10-CM | POA: Diagnosis not present

## 2018-10-11 DIAGNOSIS — K7581 Nonalcoholic steatohepatitis (NASH): Secondary | ICD-10-CM | POA: Diagnosis not present

## 2018-10-11 DIAGNOSIS — F411 Generalized anxiety disorder: Secondary | ICD-10-CM | POA: Diagnosis not present

## 2018-10-11 DIAGNOSIS — M7541 Impingement syndrome of right shoulder: Secondary | ICD-10-CM | POA: Diagnosis not present

## 2018-10-11 DIAGNOSIS — E039 Hypothyroidism, unspecified: Secondary | ICD-10-CM | POA: Diagnosis not present

## 2018-10-11 LAB — CBC WITH DIFFERENTIAL/PLATELET
Basophils Absolute: 0.1 10*3/uL (ref 0.0–0.1)
Basophils Relative: 0.8 % (ref 0.0–3.0)
Eosinophils Absolute: 0.3 10*3/uL (ref 0.0–0.7)
Eosinophils Relative: 3.6 % (ref 0.0–5.0)
HCT: 37.5 % — ABNORMAL LOW (ref 39.0–52.0)
Hemoglobin: 13.1 g/dL (ref 13.0–17.0)
Lymphocytes Relative: 16.8 % (ref 12.0–46.0)
Lymphs Abs: 1.2 10*3/uL (ref 0.7–4.0)
MCHC: 34.8 g/dL (ref 30.0–36.0)
MCV: 105.9 fl — ABNORMAL HIGH (ref 78.0–100.0)
Monocytes Absolute: 0.8 10*3/uL (ref 0.1–1.0)
Monocytes Relative: 11 % (ref 3.0–12.0)
Neutro Abs: 4.9 10*3/uL (ref 1.4–7.7)
Neutrophils Relative %: 67.8 % (ref 43.0–77.0)
Platelets: 56 10*3/uL — ABNORMAL LOW (ref 150.0–400.0)
RBC: 3.54 Mil/uL — ABNORMAL LOW (ref 4.22–5.81)
RDW: 15.3 % (ref 11.5–15.5)
WBC: 7.2 10*3/uL (ref 4.0–10.5)

## 2018-10-11 LAB — COMPREHENSIVE METABOLIC PANEL
ALT: 46 U/L (ref 0–53)
AST: 61 U/L — ABNORMAL HIGH (ref 0–37)
Albumin: 2.8 g/dL — ABNORMAL LOW (ref 3.5–5.2)
Alkaline Phosphatase: 127 U/L — ABNORMAL HIGH (ref 39–117)
BUN: 13 mg/dL (ref 6–23)
CO2: 27 mEq/L (ref 19–32)
Calcium: 9.8 mg/dL (ref 8.4–10.5)
Chloride: 101 mEq/L (ref 96–112)
Creatinine, Ser: 0.79 mg/dL (ref 0.40–1.50)
GFR: 99.19 mL/min (ref >60.00)
Glucose, Bld: 258 mg/dL — ABNORMAL HIGH (ref 70–99)
Potassium: 4.8 mEq/L (ref 3.5–5.1)
Sodium: 131 mEq/L — ABNORMAL LOW (ref 135–145)
Total Bilirubin: 2.6 mg/dL — ABNORMAL HIGH (ref 0.2–1.2)
Total Protein: 6.6 g/dL (ref 6.0–8.3)

## 2018-10-11 LAB — PROTIME-INR
INR: 1.4 ratio — ABNORMAL HIGH (ref 0.8–1.0)
Prothrombin Time: 16 s — ABNORMAL HIGH (ref 9.6–13.1)

## 2018-10-11 NOTE — Patient Instructions (Addendum)
Please continue lasix at current dose.  Continue aldactone at current dose.  Continue propranolol at current dose.  Continue levaquin at current dose.  Continue lactulose at current dose.  You will be due for ultrasound abdomen for [facility] screening in August 2020. We will contact you with an appointment when it gets closer to that time.  Please follow up with Dr Hilarie Fredrickson in September 2020.   Your provider has requested that you go to the basement level for lab work before leaving today. Press "B" on the elevator. The lab is located at the first door on the left as you exit the elevator.

## 2018-10-11 NOTE — Progress Notes (Signed)
Subjective:    Patient ID: Adam Butler, male    DOB: 10/03/1955, 63 y.o.   MRN: [id number]  HPI Adam Butler is a 63 year old male with a history of NASH cirrhosis with portal hypertension manifested by ascites, esophageal varices, hepatic encephalopathy, history of SBP, history of colon polyps, nonspecific colitis who is here for follow-up.  He is here alone today.  He was last seen on 04/17/2018.  He reports that he is been doing well.  He has developed some scrotal swelling which was felt to possibly be inguinal hernia.  He was seen at [facility] surgical a few weeks ago by Dr. Rosana Hoes who felt that this was scrotal ascites.  He reports that he notices the swelling when he is standing or towards the end of the day.  This goes away if he lies down for greater than 1 to 2 hours.  It is not painful.  He feels that he is doing very well.  He has had no issues with confusion.  He has continued on lactulose which he is using on a daily basis.  If his stools become hard he will take an additional dose.  He has not been having diarrhea.  No blood in his stool or melena.  He is remained on Levaquin daily for SBP prophylaxis.  He is trying to eat a low-sodium diet and feels that this is been more easy since he has been inside during the COVID-19 pandemic travel restrictions.  He continues Lasix 20 mg and Aldactone 50 mg daily.  He is also on propranolol 20 mg twice daily.   Review of Systems As per HPI, otherwise negative  Current Medications, Allergies, Past Medical History, Past Surgical History, Family History and Social History were reviewed in [employer] record.     Objective:   Physical Exam BP 120/76   Pulse 68   Ht 5' 6"  (1.676 m)   Wt 254 lb 3.2 oz (115.3 kg)   BMI 41.03 kg/m  Gen: awake, alert, NAD HEENT: anicteric, op clear CV: RRR, no mrg Pulm: CTA b/l Abd: soft, obese, NT/ND, +BS throughout Ext: no c/c, trace pretibial edema Neuro: nonfocal, no  asterixis  ABDOMEN ULTRASOUND COMPLETE   COMPARISON:  Abdominal ultrasound dated February 06, 2018.   FINDINGS: Gallbladder: Multiple gallstones again noted. No wall thickening visualized. No sonographic Murphy sign noted by sonographer.   Common bile duct: Diameter: 4 mm, normal.   Liver: No focal lesion identified. Unchanged nodular contour with coarsened parenchymal echogenicity. Portal vein is patent on color Doppler imaging with normal direction of blood flow towards the liver.   IVC: No abnormality visualized.   Pancreas: Visualized portion unremarkable.   Spleen: Slightly increased splenomegaly with calculated volume of 1,205 mL, previously 1087 mL.   Right Kidney: Length: 13.0 cm. Echogenicity within normal limits. No mass or hydronephrosis visualized. 1.4 cm simple cyst in the lower pole.   Left Kidney: Length: 12.2 cm. Echogenicity within normal limits. No mass or hydronephrosis visualized.   Abdominal aorta: No aneurysm visualized.   Other findings: Recanalized umbilical vein again noted.   IMPRESSION: 1. Cirrhosis without focal hepatic lesion. 2. Sequelae of portal hypertension with mildly increased splenomegaly and unchanged recanalized umbilical vein. 3. Cholelithiasis.     Electronically Signed   By: Titus Dubin M.D.   On: 08/13/2018 15:32   SCROTAL ULTRASOUND   DOPPLER ULTRASOUND OF THE TESTICLES   TECHNIQUE: Complete ultrasound examination of the testicles, epididymis, and other scrotal structures  was performed. Color and spectral Doppler ultrasound were also utilized to evaluate blood flow to the testicles.   COMPARISON:  CT abdomen and pelvis dated 05/13/2017.   FINDINGS: Right testicle   Measurements: 3.2 x 2 x 2.4 cm. No mass or microlithiasis visualized.   Left testicle   Measurements: 3.7 x 1.8 x 2.4 cm. No mass or microlithiasis visualized.   Right epididymis:  Normal in size and appearance.   Left epididymis:  Normal in  size and appearance.   Hydrocele:  None visualized.   Varicocele:  Bilateral   Pulsed Doppler interrogation of both testes demonstrates normal low resistance arterial and venous waveforms bilaterally.   There is a complex cystic and solid mass within the RIGHT inguinal region, measuring 9.9 x 3.5 x 4.8 cm. This could represent a RIGHT inguinal hernia.   IMPRESSION: 1. Large complex cystic and solid mass within the RIGHT inguinal region, measuring 9.9 x 3.5 x 4.8 cm. This is of uncertain etiology and there is no corresponding finding seen in this area on earlier CT of 05/13/2017. This may represent a new inguinal hernia and/or extension of ascites from the pelvis. Recommend CT of the abdomen and pelvis for further characterization. 2. Both testicles appear normal. No testicular mass. No evidence of testicular torsion or orchitis. No evidence of epididymitis. 3. Bilateral varicoceles.     Electronically Signed   By: Franki Cabot M.D.   On: 09/13/2018 21:29       Assessment & Plan:  63 year old male with a history of NASH cirrhosis with portal hypertension manifested by ascites, esophageal varices, hepatic encephalopathy, history of SBP, history of colon polyps, nonspecific colitis who is here for follow-up.  1.  NASH cirrhosis --he has been stable over the last several months and is doing well clinically. --Ascites --improved particularly in the last few months.  Now stable on Lasix 20 mg and Aldactone 50 mg daily.  Continue current doses.  Continue low-sodium diet --History of SBP --no recurrent issues with large volume ascites or recurrent infection.  He will continue Levaquin 500 mg daily for prophylaxis --Esophageal varices --small varices at endoscopy in 2019.  Back on propranolol 20 mg twice daily.  Consider repeat endoscopy in June 2021.  We discussed how beta-blockers are often held in patients with history of SBP but for him prophylaxis may outweigh the risks.  We will  continue beta-blocker for now at low dose --Hepatic encephalopathy --history of this, was particularly bad several years ago.  Doing well on lactulose at current dose. --[place] screening is up-to-date with ultrasound in February 2020 which we reviewed.  Repeat ultrasound in August 2020. --CBC, CMP and INR today  2.  History of adenomatous colon polyps and strep bovis bacteremia --colonoscopy last year.  3-year surveillance interval recommended.  Fortunately a colon cancer was not found despite his strep bovis bacteremia.  25 minutes spent with the patient today. Greater than 50% was spent in counseling and coordination of care with the patient

## 2018-10-12 ENCOUNTER — Ambulatory Visit: Payer: PPO | Admitting: Internal Medicine

## 2018-10-31 ENCOUNTER — Ambulatory Visit (INDEPENDENT_AMBULATORY_CARE_PROVIDER_SITE_OTHER): Payer: PPO | Admitting: Family Medicine

## 2018-10-31 ENCOUNTER — Other Ambulatory Visit: Payer: Self-pay

## 2018-10-31 VITALS — BP 120/68 | HR 67 | Temp 98.1°F | Resp 18 | Ht 67.0 in | Wt 253.6 lb

## 2018-10-31 DIAGNOSIS — I739 Peripheral vascular disease, unspecified: Secondary | ICD-10-CM | POA: Diagnosis not present

## 2018-10-31 NOTE — Patient Instructions (Addendum)
Get your refill - even if you have to pay cash  Wear your compression socks - I can send in knee high ones if they are easier to get on and off  Walk as much as you can  I think the dark color is probably both vascular disease and I think some bruising to your right foot   Peripheral Vascular Disease  Peripheral vascular disease (PVD) is a disease of the blood vessels that are not part of your heart and brain. A simple term for PVD is poor circulation. In most cases, PVD narrows the blood vessels that carry blood from your heart to the rest of your body. This can reduce the supply of blood to your arms, legs, and internal organs, like your stomach or kidneys. However, PVD most often affects a person's lower legs and feet. Without treatment, PVD tends to get worse. PVD can also lead to acute ischemic limb. This is when an arm or leg suddenly cannot get enough blood. This is a medical emergency. Follow these instructions at home: Lifestyle  Do not use any products that contain nicotine or tobacco, such as cigarettes and e-cigarettes. If you need help quitting, ask your doctor.  Lose weight if you are overweight. Or, stay at a healthy weight as told by your doctor.  Eat a diet that is low in fat and cholesterol. If you need help, ask your doctor.  Exercise regularly. Ask your doctor for activities that are right for you. General instructions  Take over-the-counter and prescription medicines only as told by your doctor.  Take good care of your feet: ? Wear comfortable shoes that fit well. ? Check your feet often for any cuts or sores.  Keep all follow-up visits as told by your doctor This is important. Contact a doctor if:  You have cramps in your legs when you walk.  You have leg pain when you are at rest.  You have coldness in a leg or foot.  Your skin changes.  You are unable to get or have an erection (erectile dysfunction).  You have cuts or sores on your feet that do not  heal. Get help right away if:  Your arm or leg turns cold, numb, and blue.  Your arms or legs become red, warm, swollen, painful, or numb.  You have chest pain.  You have trouble breathing.  You suddenly have weakness in your face, arm, or leg.  You become very confused or you cannot speak.  You suddenly have a very bad headache.  You suddenly cannot see. Summary  Peripheral vascular disease (PVD) is a disease of the blood vessels.  A simple term for PVD is poor circulation. Without treatment, PVD tends to get worse.  Treatment may include exercise, low fat and low cholesterol diet, and quitting smoking. This information is not intended to replace advice given to you by your health care provider. Make sure you discuss any questions you have with your health care provider. Document Released: 09/21/2009 Document Revised: 08/04/2016 Document Reviewed: 08/04/2016 Elsevier Interactive Patient Education  2019 [employer].

## 2018-10-31 NOTE — Progress Notes (Signed)
Patient ID: Adam Butler, male    DOB: [birth year], 63 y.o.   MRN: [id number]  PCP: Susy Frizzle, MD  Chief Complaint  Patient presents with  . Foot Injury    R foot black from DM, x1 week    Subjective:   Adam Butler is a 63 y.o. male, presents to clinic with CC of darkening skin to b/l legs and toes to right foot dark/black, almost bruised looking.  He has rod in his right ankle and he wonders if it has something to do with that.  He states darkening to both legs is not new, gradually occurring.  He does have old scars to his right shin that are dark as well.  He has no wounds, drainage, ulcerations.  He denies any tingling, numbness or decreased ROM from his baseline.  He did run out of his spironolactone medicine over a week ago and he has noticed increased swelling to his legs with a little bit of tenderness over the top of his feet.  He states that the insurance would not pay for it because he was too early to pick it up, he has continued to take his Lasix.  He also has not been wearing his prescribed compression stockings because they are "too hard" to put on and off.  He instead is wearing "diabetic socks" that go up to his mid calf.     Patient Active Problem List   Diagnosis Date Noted  . History of colonic polyps   . Benign neoplasm of ascending colon   . Noninfectious gastroenteritis   . Chronic diastolic CHF (congestive heart failure) ([place])   . Thoracic ascending aortic aneurysm ([place])   . OA (osteoarthritis) of knee 07/19/2016  . Hepatic encephalopathy ([place]) 12/16/2015  . GERD (gastroesophageal reflux disease) 09/29/2015  . Type 2 diabetes mellitus ([place]) 08/18/2015  . CAD in native artery   . Hypokalemia   . Liver cirrhosis secondary to NASH ([place]) 06/06/2015  . Tobacco abuse 05/04/2015  . PFO (patent foramen ovale): Per TEE 04/20/2015 04/20/2015  . Esophageal varices without bleeding ([place]) 08/16/2010     Prior to Admission medications   Medication  Sig Start Date End Date Taking? Authorizing Provider  cyanocobalamin 1000 MCG tablet Take 1,000 mcg by mouth daily. Vitamin B12   Yes [provider]  furosemide (LASIX) 20 MG tablet Take 1 tablet (20 mg total) by mouth daily. Patient taking differently: Take 40 mg by mouth daily.  02/14/18  Yes Delsa Grana, PA-C  insulin regular (NOVOLIN R,HUMULIN R) 100 units/mL injection Inject 2-8 Units into the skin 3 (three) times daily as needed for high blood sugar (CBG >150).    Yes [provider]  INSULIN SYRINGE 1CC/29G (SAFETY INSULIN SYRINGES) 29G X 1/2" 1 ML MISC 1 application by Does not apply route daily. 01/27/18  Yes Regalado, Belkys A, MD  lactulose (CHRONULAC) 10 GM/15ML solution Take 45 mLs (30 g total) by mouth 3 (three) times daily. 02/22/18  Yes Delsa Grana, PA-C  levofloxacin (LEVAQUIN) 500 MG tablet TAKE 1 TABLET BY MOUTH ONCE DAILY Patient taking differently: Take 500 mg by mouth daily. Will take daily for infection 08/23/18  Yes Pickard, Cammie Mcgee, MD  magnesium oxide (MAG-OX) 400 MG tablet Take 1 tablet (400 mg total) by mouth daily. 03/02/18  Yes Susy Frizzle, MD  Melatonin 5 MG TABS Take 5 mg by mouth at bedtime.   Yes [provider]  Multiple Vitamin (MULTIVITAMIN WITH MINERALS)  TABS tablet Take 1 tablet by mouth daily.   Yes [provider]  nitroGLYCERIN (NITROSTAT) 0.4 MG SL tablet Place 1 tablet (0.4 mg total) under the tongue every 5 (five) minutes as needed for chest pain (chest pain). 07/19/16  Yes Baity, Coralie Keens, NP  potassium chloride (K-DUR) 10 MEQ tablet Take 1 tablet (10 mEq total) by mouth daily. 03/02/18  Yes Susy Frizzle, MD  propranolol (INDERAL) 20 MG tablet Take 1 tablet (20 mg total) by mouth 2 (two) times daily. 08/10/18  Yes Susy Frizzle, MD  spironolactone (ALDACTONE) 50 MG tablet Take 0.5 tablets (25 mg total) by mouth 2 (two) times daily. 05/28/18  Yes Susy Frizzle, MD     Allergies  Allergen Reactions  .  Isosorbide Nitrate Other (See Comments)    Nose bleeds     Family History  Problem Relation Age of Onset  . Heart attack Brother 75       MI  . Diabetes Brother   . Heart attack Father   . Diabetes Father   . COPD Father   . COPD Mother   . Heart attack Brother   . Stroke Neg Hx      Social History   Socioeconomic History  . Marital status: Married    Spouse name: Not on file  . Number of children: 3  . Years of education: Not on file  . Highest education level: Not on file  Occupational History  . Occupation: Merchandiser, retail: OTHER    Comment: Worked in Theatre manager prior  Social Needs  . Financial resource strain: Not on file  . Food insecurity:    Worry: Not on file    Inability: Not on file  . Transportation needs:    Medical: Not on file    Non-medical: Not on file  Tobacco Use  . Smoking status: Current Every Day Smoker    Packs/day: 0.25    Years: 36.00    Pack years: 9.00    Types: E-cigarettes, Cigarettes  . Smokeless tobacco: Never Used  . Tobacco comment: uses vapor cigarettes (2016 ); I smoke about 4 cigarettes a day  Substance and Sexual Activity  . Alcohol use: No    Alcohol/week: 0.0 standard drinks  . Drug use: No  . Sexual activity: Yes  Lifestyle  . Physical activity:    Days per week: Not on file    Minutes per session: Not on file  . Stress: Not on file  Relationships  . Social connections:    Talks on phone: Not on file    Gets together: Not on file    Attends religious service: Not on file    Active member of club or organization: Not on file    Attends meetings of clubs or organizations: Not on file    Relationship status: Not on file  . Intimate partner violence:    Fear of current or ex partner: Not on file    Emotionally abused: Not on file    Physically abused: Not on file    Forced sexual activity: Not on file  Other Topics Concern  . Not on file  Social History Narrative   Married   Gets regular exercise:  walking     Review of Systems  Constitutional: Negative.  Negative for activity change, appetite change, chills, diaphoresis, fatigue and fever.  HENT: Negative.   Eyes: Negative.   Respiratory: Negative.  Negative for chest tightness, shortness of breath and  wheezing.   Cardiovascular: Negative.  Negative for chest pain and palpitations.       No orthopnea, PND  Gastrointestinal: Negative.   Endocrine: Negative.   Genitourinary: Negative.   Musculoskeletal: Negative.  Negative for arthralgias, joint swelling and myalgias.  Skin: Positive for color change. Negative for pallor, rash and wound.  Allergic/Immunologic: Negative.   Neurological: Negative.  Negative for dizziness, syncope, weakness, light-headedness, numbness and headaches.  Hematological: Negative.   Psychiatric/Behavioral: Negative.   All other systems reviewed and are negative.      Objective:    Vitals:   10/31/18 1422  BP: 120/68  Pulse: 67  Resp: 18  Temp: 98.1 F (36.7 C)  SpO2: 96%  Weight: 253 lb 9.6 oz (115 kg)  Height: 5' 7"  (1.702 m)      Physical Exam Vitals signs and nursing note reviewed.  Constitutional:      General: He is not in acute distress.    Appearance: He is not ill-appearing, toxic-appearing or diaphoretic.  Cardiovascular:     Rate and Rhythm: Normal rate.     Pulses:          Dorsalis pedis pulses are 2+ on the right side and 2+ on the left side.       Posterior tibial pulses are 2+ on the right side and 2+ on the left side.     Heart sounds: No murmur. No friction rub. No gallop.      Comments: Non-pitting pedal, ankle and pre-tibial edema Diffuse LE hyperpigmentation from mid-shin down Musculoskeletal:     Right ankle: He exhibits swelling. He exhibits normal range of motion, no ecchymosis, no deformity, no laceration and normal pulse. No tenderness.     Right lower leg: Edema present.     Left lower leg: Edema present.     Right foot: Normal range of motion and normal  capillary refill. Swelling present. No tenderness, bony tenderness, crepitus, deformity or laceration.     Left foot: Normal range of motion and normal capillary refill. Swelling present. No tenderness, bony tenderness, crepitus, deformity or laceration.     Comments: Appearance of bruising to distal dorsal right foot to MTP's, not extending into toes  Feet:     Right foot:     Skin integrity: Skin integrity normal. No ulcer, blister, skin breakdown, erythema, warmth, callus, dry skin or fissure.     Toenail Condition: Right toenails are normal.     Left foot:     Skin integrity: Skin integrity normal. No ulcer, blister, skin breakdown, erythema, warmth, callus, dry skin or fissure.     Toenail Condition: Left toenails are abnormally thick. Fungal disease present. Skin:    General: Skin is warm.     Coloration: Skin is not jaundiced or pale.     Findings: No erythema, lesion or rash.  Neurological:     Mental Status: He is alert.     Comments: Good sensation to light touch to b/l LE Antalgic gait with cane-(baseline)               Assessment & Plan:      ICD-10-CM   1. Peripheral vascular disease of lower extremity [facility]) I73.9     Patient is a 63 year old male with complicated medical history including [name], difficulty with med compliance in the past, presents today looking significantly improved with his baseline health compared to when I saw him last year, he does complain of swelling to his bilateral lower extremities and some  hyperpigmentation with "black toes" to his right foot.   I suspect his lower extremity edema is from running out of spironolactone, I printed a coupon for him and sent through another prescription and encouraged him to pay with cash if insurance will not approve his medications early.  I do not know why he is out of medicines early but I did encourage him strongly to get his regular medicines however he can. He was also instructed to wear his compression  stockings, I explained how diabetes socks are not the same as his compression stockings and he needs the compression to limit be lower extremity edema.  Most of his hyperpigmentation in his lower extremities appears chronic and is likely related to venous stasis.  There are no wounds or ulcerations.  The reported black toes are not suspicious for any necrosis related to his diabetes.  His feet are warm bilaterally good pulses good range of motion good sensation and strength he has normal capillary refill in all toes.  The right toes appear similar to dependent bruising or ecchymosis that I see with muscle skeletal injuries higher in the foot and ankle.  Patient denied any known injury but he does have hardware in his ankle he states.  He had no tenderness in his ankle joint or higher in the leg he was at his baseline gait and I did not see any indication for imaging.  Encouraged patient to keep a close watch on this I suspect it should gradually improve, however if it does not I would like to recheck him.  Encouraged him to contact us right away if he does have any new joint pain swelling or change to his gait sensation or strength.  Encouraged to follow-up with his PCP as previously scheduled or to follow-up with either of [place] in the event of needing a recheck or urgent follow-up  GoodRx coupon for his current Rx for spironolactone 50 mg 90 d supply printed for him - around $20 to get meds for cash   Delsa Grana, PA-C 10/31/18 2:41 PM

## 2018-11-02 ENCOUNTER — Encounter: Payer: Self-pay | Admitting: Family Medicine

## 2018-11-02 MED ORDER — SPIRONOLACTONE 50 MG PO TABS: 25.0000 mg | ORAL_TABLET | Freq: Two times a day (BID) | ORAL | 0 refills | Status: AC

## 2018-11-19 ENCOUNTER — Ambulatory Visit: Payer: Self-pay

## 2018-11-20 ENCOUNTER — Other Ambulatory Visit: Payer: Self-pay

## 2018-11-20 NOTE — Patient Outreach (Signed)
[place] [facility]) Care Management  11/20/2018  Adam Butler 11/23/1955 [id number]    1st unsuccessful outreach to the patient for assessment.  No answer.  HIPAA compliant voicemail left with contact information.  Plan: RN Health Coach will send letter. Pad[place]i will make outreach attempt to the patient within thirty business days.   Lazaro Arms RN, BSN, [place] Direct Dial:  [phone]  Fax: [phone]

## 2018-12-20 ENCOUNTER — Other Ambulatory Visit: Payer: Self-pay

## 2018-12-20 NOTE — Patient Outreach (Signed)
[place] [facility]) Care Management  12/20/2018   Adam Butler 1955/12/22 [id number]  Subjective: Successful call to the patient for assessment.  Two patient Identifiers obtained.  The patient states he has been feeling great.  He denies any pain or falls.  He states that his blood sugars have been varying.  His blood sugar this morning was 164.  He states that he has two lows in the past months.  His blood sugar went down to 64.  Discussed the signs and symptoms of hypoglycemia, things to help bring his blood sugars up and when to recheck.  He verbalized understanding.  He states he is not sure when his next appointment will be due to the corona virus.  Advised the patient to inform his physician if he continues to have lows.  Patient was notified that he will be transitioning to [facility].   Current Medications:  Current Outpatient Medications  Medication Sig Dispense Refill  . cyanocobalamin 1000 MCG tablet Take 1,000 mcg by mouth daily. Vitamin B12    . furosemide (LASIX) 20 MG tablet Take 1 tablet (20 mg total) by mouth daily. (Patient taking differently: Take 40 mg by mouth daily. ) 90 tablet 1  . insulin regular (NOVOLIN R,HUMULIN R) 100 units/mL injection Inject 2-8 Units into the skin 3 (three) times daily as needed for high blood sugar (CBG >150).     . INSULIN SYRINGE 1CC/29G (SAFETY INSULIN SYRINGES) 29G X 1/2" 1 ML MISC 1 application by Does not apply route daily. 30 each 0  . lactulose (CHRONULAC) 10 GM/15ML solution Take 45 mLs (30 g total) by mouth 3 (three) times daily. 1892 mL 3  . levofloxacin (LEVAQUIN) 500 MG tablet TAKE 1 TABLET BY MOUTH ONCE DAILY (Patient taking differently: Take 500 mg by mouth daily. Will take daily for infection) 30 tablet 3  . magnesium oxide (MAG-OX) 400 MG tablet Take 1 tablet (400 mg total) by mouth daily. 90 tablet 1  . Melatonin 5 MG TABS Take 5 mg by mouth at bedtime.    . Multiple Vitamin (MULTIVITAMIN WITH MINERALS) TABS tablet  Take 1 tablet by mouth daily.    . nitroGLYCERIN (NITROSTAT) 0.4 MG SL tablet Place 1 tablet (0.4 mg total) under the tongue every 5 (five) minutes as needed for chest pain (chest pain). 25 tablet 5  . potassium chloride (K-DUR) 10 MEQ tablet Take 1 tablet (10 mEq total) by mouth daily. 90 tablet 1  . propranolol (INDERAL) 20 MG tablet Take 1 tablet (20 mg total) by mouth 2 (two) times daily. 60 tablet 5  . spironolactone (ALDACTONE) 50 MG tablet Take 0.5 tablets (25 mg total) by mouth 2 (two) times daily. 90 tablet 0   No current facility-administered medications for this visit.     Functional Status:  In your present state of health, do you have any difficulty performing the following activities: 02/14/2018 01/25/2018  Hearing? N N  Vision? N N  Difficulty concentrating or making decisions? Y N  Walking or climbing stairs? Y Y  Dressing or bathing? N N  Doing errands, shopping? N N  Some recent data might be hidden    Fall/Depression Screening: Fall Risk  12/20/2018 09/20/2018 08/21/2018  Falls in the past year? 0 0 1  Comment - - -  Number falls in past yr: - 0 0  Injury with Fall? - 0 0  Risk Factor Category  - - -  Risk for fall due to : - - -  Risk for fall due to: Comment - - -  Follow up - - -   PHQ 2/9 Scores 02/14/2018 01/31/2018 12/22/2017 06/13/2017 11/17/2016 07/19/2016 01/11/2016  PHQ - 2 Score 1 4 6  0 0 1 0  PHQ- 9 Score - 16 18 - - - -    Assessment: Patient will continue to benefit from health coach outreach for disease management and support. THN CM Care Plan Problem One     Most Recent Value  THN Long Term Goal   In 90 days the patient will maintain his a1c of 6.8   THN Long Term Goal Start Date  12/20/18  Interventions for Problem One Long Term Goal   Reviewed FBS reviewed signs and symptoms of hypoglycemia, encouraged medication adherence encouraged eating heathy food and fluid choices.     Plan: RN Health Coach will contact patient in the month of September and  patient agrees to next outreach.   Lazaro Arms RN, BSN, [place] Direct Dial:  [phone]  Fax: [phone]

## 2018-12-25 ENCOUNTER — Other Ambulatory Visit: Payer: Self-pay | Admitting: Family Medicine

## 2018-12-25 DIAGNOSIS — K7682 Hepatic encephalopathy: Secondary | ICD-10-CM

## 2018-12-25 DIAGNOSIS — K7290 Hepatic failure, unspecified without coma: Secondary | ICD-10-CM

## 2018-12-25 NOTE — Telephone Encounter (Signed)
I do not know why he needs to take this everyday unless GI want him on it for SBP prophylaxis.  He should contact his gi. If they don't want it he can stop it.

## 2018-12-25 NOTE — Telephone Encounter (Signed)
Ok to refill 

## 2018-12-31 ENCOUNTER — Telehealth: Payer: Self-pay | Admitting: *Deleted

## 2018-12-31 ENCOUNTER — Other Ambulatory Visit: Payer: Self-pay | Admitting: Family Medicine

## 2018-12-31 DIAGNOSIS — K7290 Hepatic failure, unspecified without coma: Secondary | ICD-10-CM

## 2018-12-31 DIAGNOSIS — K7682 Hepatic encephalopathy: Secondary | ICD-10-CM

## 2018-12-31 NOTE — Telephone Encounter (Signed)
[facility] has received your information, and the request will be reviewed. You may close this dialog, return to your dashboard, and perform other tasks.  You will receive an electronic determination in CoverMyMeds. You can see the latest determination by locating this request on your dashboard or by reopening this request. You will also receive a faxed copy of the determination. If you have any questions please contact [facility] at [phone].  If you need assistance, please chat with CoverMyMeds or call us at [phone].

## 2018-12-31 NOTE — Telephone Encounter (Signed)
Received request from pharmacy for PA on Constulose.   PA submitted.   Dx: F16- constipation.

## 2019-01-01 ENCOUNTER — Telehealth: Payer: Self-pay | Admitting: Internal Medicine

## 2019-01-01 MED ORDER — LEVOFLOXACIN 500 MG PO TABS: 500.0000 mg | ORAL_TABLET | Freq: Every day | ORAL | 1 refills | Status: DC

## 2019-01-01 NOTE — Telephone Encounter (Signed)
Rx refill sent to [facility].

## 2019-01-01 NOTE — Telephone Encounter (Signed)
Patient would like a refill for levofloxacin (LEVAQUIN) 500 MG

## 2019-01-03 ENCOUNTER — Other Ambulatory Visit: Payer: Self-pay

## 2019-01-03 NOTE — Patient Outreach (Signed)
[place] [facility]) Care Management  01/03/2019  Adam Butler [birth year] [id number]    RN Health Coach closing the program.  Patient is transitioning to external program [facility] for continued case management.  Lazaro Arms RN, BSN, [place] Direct Dial:  [phone]  Fax: [phone]

## 2019-01-04 MED ORDER — LACTULOSE 10 GM/15ML PO SOLN: ORAL | 3 refills | Status: AC

## 2019-01-04 NOTE — Telephone Encounter (Signed)
Received PA determination.   PA Case: [id number], Status: Approved, Coverage Starts on: 01/02/2019 12:00:00 AM, Coverage Ends on: 07/11/2019 12:00:00 AM.  Pharmacy made aware.

## 2019-01-29 ENCOUNTER — Telehealth: Payer: Self-pay | Admitting: Interventional Cardiology

## 2019-01-29 NOTE — Telephone Encounter (Signed)

## 2019-01-29 NOTE — Progress Notes (Signed)
Cardiology Office Note   Date:  01/30/2019   ID:  Adam Butler, Adam Butler 05-07-1956, MRN [id number]  PCP:  Susy Frizzle, MD    No chief complaint on file.  CAD  Wt Readings from Last 3 Encounters:  01/30/19 250 lb (113.4 kg)  10/31/18 253 lb 9.6 oz (115 kg)  10/11/18 254 lb 3.2 oz (115.3 kg)       History of Present Illness: Adam Butler is a 63 y.o. male  With DM, NASH/cirrhosis, espohageal varices s/p banding Who has a h/o CAD and inferior MI in 12/11. BMS was placed in the circumflex at that time. Repeat cath in 4/16 showed CTO of RCA.   Managing liver disease with lactulose. Follows with Dr. Hilarie Fredrickson.   He was admitted for bacteremia in 10/18.  He had a chest CT showing a small thoracic aortic aneurysm.  He has f/u with Dr. Prescott Gum.    Past Medical History:  Diagnosis Date  . Arthritis   . Cholelithiasis   . Cirrhosis ([place]) 2011   Cryptogenic, Likely NASH. Family/pt deny EtOH. HCV, HBV, HAV negative. ANA negative. AMA positive. Ascites 12/11  . Coronary artery disease    Inferior MI 12/11; LHC with occluded mid CFX and 80% proximal RCA. EF 55%. He had 3.0 x 28 vision BMS to CFX  . Diastolic CHF, acute ([place])    Echo 12/11 with ef 50-55% and mild LVH. EF 55% by LV0gram in 12/11  . Esophageal varices ([place]) 2011, 2013   no hx acute variceal bleed  . Hepatic encephalopathy ([place]) 2011, 12/2013  . Hyperlipemia   . Myocardial infarction [facility]) ? 2012  . PFO (patent foramen ovale): Per TEE 04/20/2015 04/20/2015  . Portal hypertension ([place])   . Rectal varices   . S/P coronary artery stent placement 06/2010  . SVT (supraventricular tachycardia) ([place])    1/12: appeared to be an ectopic atrial tachycardia. Required DCCV with hemodynamic instability  . Type II diabetes mellitus ([place]) 2011    Past Surgical History:  Procedure Laterality Date  . APPENDECTOMY    . BIOPSY  01/02/2018   Procedure: BIOPSY;  Surgeon: Jerene Bears, MD;  Location: [facility]  ENDOSCOPY;  Service: Gastroenterology;;  . CARDIAC CATHETERIZATION  07/01/2010   BMS to CFX.  Marland Kitchen CATARACT EXTRACTION W/PHACO Left 04/28/2016   Procedure: CATARACT EXTRACTION PHACO AND INTRAOCULAR LENS PLACEMENT (IOC);  Surgeon: Leandrew Koyanagi, MD;  Location: [facility] ORS;  Service: Ophthalmology;  Laterality: Left;  Lot# [id number] H [place]: 05:31.7 AP%: 28.2 CDE: 93.48   . COLONOSCOPY  04/13/2012   Procedure: COLONOSCOPY;  Surgeon: Inda Castle, MD;  Location: [facility] ENDOSCOPY;  Service: Endoscopy;  Laterality: N/A;  . COLONOSCOPY WITH PROPOFOL N/A 03/22/2017   Procedure: COLONOSCOPY WITH PROPOFOL;  Surgeon: Doran Stabler, MD;  Location: [facility] ENDOSCOPY;  Service: Gastroenterology;  Laterality: N/A;  . COLONOSCOPY WITH PROPOFOL N/A 01/02/2018   Procedure: COLONOSCOPY WITH PROPOFOL;  Surgeon: Jerene Bears, MD;  Location: [facility] ENDOSCOPY;  Service: Gastroenterology;  Laterality: N/A;  . CORONARY ANGIOPLASTY    . CORONARY STENT PLACEMENT  06/30/2010   CFX   Distal        . ESOPHAGEAL BANDING N/A 01/01/2014   Procedure: ESOPHAGEAL BANDING;  Surgeon: Jerene Bears, MD;  Location: [facility] ENDOSCOPY;  Service: Endoscopy;  Laterality: N/A;  . ESOPHAGOGASTRODUODENOSCOPY  04/13/2012   Procedure: ESOPHAGOGASTRODUODENOSCOPY (EGD);  Surgeon: Inda Castle, MD;  Location: [facility];  Service: Endoscopy;  Laterality: N/A;  .  ESOPHAGOGASTRODUODENOSCOPY N/A 01/01/2014   Procedure: ESOPHAGOGASTRODUODENOSCOPY (EGD);  Surgeon: Jerene Bears, MD;  Location: [facility] ENDOSCOPY;  Service: Endoscopy;  Laterality: N/A;  . ESOPHAGOGASTRODUODENOSCOPY (EGD) WITH PROPOFOL N/A 07/12/2016   Procedure: ESOPHAGOGASTRODUODENOSCOPY (EGD) WITH PROPOFOL;  Surgeon: Jerene Bears, MD;  Location: [facility] ENDOSCOPY;  Service: Gastroenterology;  Laterality: N/A;  . ESOPHAGOGASTRODUODENOSCOPY (EGD) WITH PROPOFOL N/A 01/02/2018   Procedure: ESOPHAGOGASTRODUODENOSCOPY (EGD) WITH PROPOFOL;  Surgeon: Jerene Bears, MD;  Location: [facility] ENDOSCOPY;  Service:  Gastroenterology;  Laterality: N/A;  . LEFT HEART CATHETERIZATION WITH CORONARY ANGIOGRAM N/A 10/15/2014   Procedure: LEFT HEART CATHETERIZATION WITH CORONARY ANGIOGRAM;  Surgeon: Peter M Martinique, MD;  Location[facility] CATH LAB;  Service: Cardiovascular;  Laterality: N/A;  . orif r leg    . POLYPECTOMY  01/02/2018   Procedure: POLYPECTOMY;  Surgeon: Jerene Bears, MD;  Location: [facility] ENDOSCOPY;  Service: Gastroenterology;;  . TEE WITHOUT CARDIOVERSION N/A 04/20/2015   Procedure: TRANSESOPHAGEAL ECHOCARDIOGRAM (TEE);  Surgeon: Fay Records, MD;  Location: [place];  Service: Cardiovascular;  Laterality: N/A;  . TEE WITHOUT CARDIOVERSION N/A 05/17/2017   Procedure: TRANSESOPHAGEAL ECHOCARDIOGRAM (TEE);  Surgeon: Josue Hector, MD;  Location[facility] ENDOSCOPY;  Service: Cardiovascular;  Laterality: N/A;     Current Outpatient Medications  Medication Sig Dispense Refill  . cyanocobalamin 1000 MCG tablet Take 1,000 mcg by mouth daily. Vitamin B12    . furosemide (LASIX) 20 MG tablet Take 1 tablet (20 mg total) by mouth daily. (Patient taking differently: Take 40 mg by mouth daily. ) 90 tablet 1  . insulin regular (NOVOLIN R,HUMULIN R) 100 units/mL injection Inject 2-8 Units into the skin 3 (three) times daily as needed for high blood sugar (CBG >150).     . INSULIN SYRINGE 1CC/29G (SAFETY INSULIN SYRINGES) 29G X 1/2" 1 ML MISC 1 application by Does not apply route daily. 30 each 0  . lactulose (CONSTULOSE) 10 GM/15ML solution TAKE 45 MLS BY MOUTH THREE TIMES DAILY 2000 mL 3  . levofloxacin (LEVAQUIN) 500 MG tablet Take 1 tablet (500 mg total) by mouth daily. 30 tablet 1  . magnesium oxide (MAG-OX) 400 MG tablet Take 1 tablet (400 mg total) by mouth daily. 90 tablet 1  . Melatonin 5 MG TABS Take 5 mg by mouth at bedtime.    . Multiple Vitamin (MULTIVITAMIN WITH MINERALS) TABS tablet Take 1 tablet by mouth daily.    . nitroGLYCERIN (NITROSTAT) 0.4 MG SL tablet Place 1 tablet (0.4 mg total) under the tongue  every 5 (five) minutes as needed for chest pain (chest pain). 25 tablet 5  . potassium chloride (K-DUR) 10 MEQ tablet Take 1 tablet (10 mEq total) by mouth daily. 90 tablet 1  . propranolol (INDERAL) 20 MG tablet Take 1 tablet (20 mg total) by mouth 2 (two) times daily. 60 tablet 5  . spironolactone (ALDACTONE) 50 MG tablet Take 0.5 tablets (25 mg total) by mouth 2 (two) times daily. 90 tablet 0   No current facility-administered medications for this visit.     Allergies:   Isosorbide nitrate    Social History:  The patient  reports that he has been smoking e-cigarettes and cigarettes. He has a 9.00 pack-year smoking history. He has never used smokeless tobacco. He reports that he does not drink alcohol or use drugs.   Family History:  The patient's family history includes COPD in his father and mother; Diabetes in his brother and father; Heart attack in his brother and father; Heart attack (age  of onset: 27) in his brother.    ROS:  Please see the history of present illness.   Otherwise, review of systems are positive for erectile dysfunction.   All other systems are reviewed and negative.    PHYSICAL EXAM: VS:  BP 116/64   Pulse 64   Ht 5' 7"  (1.702 m)   Wt 250 lb (113.4 kg)   SpO2 96%   BMI 39.16 kg/m  , BMI Body mass index is 39.16 kg/m. GEN: Well nourished, well developed, in no acute distress  HEENT: normal  Neck: no JVD, carotid bruits, or masses Cardiac: RRR; no murmurs, rubs, or gallops,no edema  Respiratory:  clear to auscultation bilaterally, normal work of breathing GI: soft, nontender, nondistended, + BS MS: no deformity or atrophy  Skin: warm and dry, no rash Neuro:  Strength and sensation are intact Psych: euthymic mood, full affect   EKG:   The ekg ordered today demonstrates NSR, inferior and lateral Q waves   Recent Labs: 04/17/2018: Magnesium 1.4 10/11/2018: ALT 46; BUN 13; Creatinine, Ser 0.79; Hemoglobin 13.1; Platelets 56.0; Potassium 4.8; Sodium 131    Lipid Panel    Component Value Date/Time   CHOL 113 04/30/2018 0834   TRIG 42 04/30/2018 0834   HDL 46 04/30/2018 0834   CHOLHDL 2.5 04/30/2018 0834   VLDL 26.6 11/17/2016 0854   LDLCALC 55 04/30/2018 0834     Other studies Reviewed: Additional studies/ records that were reviewed today with results demonstrating: prior records reviewed.   ASSESSMENT AND PLAN:  1. CAD/Old inferior MI: Need to consider statin. Not interested at this time. He does not want to take more medicines. LDL 55 in 10/19 2. Chronic diastolic heart failure/LE edema: Appears euvolemic 3. DM: Controlled.  A1C 6.8 at last check. 4. Liver disease: stable per his report. 5. Thoracic aneurysm: He does not recall seeing Dr. PVT nad is not interested in going at this time.  Aneurysm was small and unlikely to cause problems.  His liver issues may affect if he is a candidate for elective surgery. ED: Not using NTG. OK for Viagra.  We advised him about not using Viagra after using NTG for 72 hours.   Current medicines are reviewed at length with the patient today.  The patient concerns regarding his medicines were addressed.  The following changes have been made:  No change  Labs/ tests ordered today include:  No orders of the defined types were placed in this encounter.   Recommend 150 minutes/week of aerobic exercise Low fat, low carb, high fiber diet recommended  Disposition:   FU in 1 year   Signed, Larae Grooms, MD  01/30/2019 10:29 AM    [facility] [address], [place], [place]  [address] Phone: [phone]; Fax: [phone]

## 2019-01-30 ENCOUNTER — Ambulatory Visit: Payer: PPO | Admitting: Interventional Cardiology

## 2019-01-30 ENCOUNTER — Other Ambulatory Visit: Payer: Self-pay

## 2019-01-30 ENCOUNTER — Encounter: Payer: Self-pay | Admitting: Interventional Cardiology

## 2019-01-30 VITALS — BP 116/64 | HR 64 | Ht 67.0 in | Wt 250.0 lb

## 2019-01-30 DIAGNOSIS — N529 Male erectile dysfunction, unspecified: Secondary | ICD-10-CM

## 2019-01-30 DIAGNOSIS — I7121 Aneurysm of the ascending aorta, without rupture: Secondary | ICD-10-CM

## 2019-01-30 DIAGNOSIS — I25119 Atherosclerotic heart disease of native coronary artery with unspecified angina pectoris: Secondary | ICD-10-CM | POA: Diagnosis not present

## 2019-01-30 DIAGNOSIS — I712 Thoracic aortic aneurysm, without rupture: Secondary | ICD-10-CM | POA: Diagnosis not present

## 2019-01-30 DIAGNOSIS — I5032 Chronic diastolic (congestive) heart failure: Secondary | ICD-10-CM | POA: Diagnosis not present

## 2019-01-30 MED ORDER — SILDENAFIL CITRATE 20 MG PO TABS: ORAL_TABLET | ORAL | 1 refills | Status: DC

## 2019-01-30 NOTE — Patient Instructions (Signed)
Medication Instructions:  No changes 1.) sildenafil 20 mg tablet take 3-5 tablets by mouth as needed one hr prior to sexual activity--do not take within 72 hours of nitroglycerin  If you need a refill on your cardiac medications before your next appointment, please call your pharmacy.   Lab work: none If you have labs (blood work) drawn today and your tests are completely normal, you will receive your results only by: Marland Kitchen MyChart Message (if you have MyChart) OR . A paper copy in the mail If you have any lab test that is abnormal or we need to change your treatment, we will call you to review the results.  Testing/Procedures: none  Follow-Up: At [facility], you and your health needs are our priority.  As part of our continuing mission to provide you with exceptional heart care, we have created designated Provider Care Teams.  These Care Teams include your primary Cardiologist (physician) and Advanced Practice Providers (APPs -  Physician Assistants and Nurse Practitioners) who all work together to provide you with the care you need, when you need it. You will need a follow up appointment in 1 years.  Please call our office 2 months in advance to schedule this appointment.  You may see Dr. Irish Lack or one of the following Advanced Practice Providers on your designated Care Team:   [place], PA-C Melina Copa, PA-C . Ermalinda Barrios, PA-C  Any Other Special Instructions Will Be Listed Below (If Applicable).

## 2019-01-31 ENCOUNTER — Other Ambulatory Visit: Payer: Self-pay | Admitting: Interventional Cardiology

## 2019-01-31 NOTE — Telephone Encounter (Signed)
New Message    *STAT* If patient is at the pharmacy, call can be transferred to refill team.   1. Which medications need to be refilled? (please list name of each medication and dose if known) Sildenafil  2. Which pharmacy/location (including street and city if local pharmacy) is medication to be sent to? [facility] [address] in [place]  Pt says the medication was sent to the wrong pharmacy   3. Do they need a 30 day or 90 day supply? 30 or 90

## 2019-02-01 MED ORDER — SILDENAFIL CITRATE 20 MG PO TABS: ORAL_TABLET | ORAL | 1 refills | Status: AC

## 2019-02-01 NOTE — Telephone Encounter (Signed)
The pt is advised that I just sent his refill of Sildenafil to [facility] in [place]. He verbalized understanding and thanked me for letting him know.

## 2019-02-04 ENCOUNTER — Telehealth: Payer: Self-pay

## 2019-02-04 NOTE — Telephone Encounter (Signed)
I have started a Sildenafil PA through covermymeds. Key: [id number]

## 2019-02-07 ENCOUNTER — Telehealth: Payer: Self-pay | Admitting: *Deleted

## 2019-02-07 DIAGNOSIS — K7460 Unspecified cirrhosis of liver: Secondary | ICD-10-CM

## 2019-02-07 NOTE — Telephone Encounter (Signed)
-----   Message from Larina Bras, [place] sent at 10/11/2018  3:24 PM EDT ----- Needs abd u/s for hcc screening; cirrhosis in August 2020. See 10/11/18 office visit; also needs follow up office visit September 2020 with dr pyrtle

## 2019-02-07 NOTE — Telephone Encounter (Signed)
I have spoken to patient to advise of ultrasound time/date/location and prep as well as office visit time and date. He verbalizes understanding.

## 2019-02-15 ENCOUNTER — Other Ambulatory Visit: Payer: Self-pay

## 2019-02-15 ENCOUNTER — Ambulatory Visit (INDEPENDENT_AMBULATORY_CARE_PROVIDER_SITE_OTHER): Payer: PPO | Admitting: Family Medicine

## 2019-02-15 VITALS — BP 138/74 | HR 74 | Temp 99.0°F | Resp 18 | Ht 66.0 in | Wt 254.0 lb

## 2019-02-15 DIAGNOSIS — E1165 Type 2 diabetes mellitus with hyperglycemia: Secondary | ICD-10-CM

## 2019-02-15 DIAGNOSIS — IMO0001 Reserved for inherently not codable concepts without codable children: Secondary | ICD-10-CM

## 2019-02-15 NOTE — Progress Notes (Signed)
Subjective:    Patient ID: Adam Butler, male    DOB: [birth year], 63 y.o.   MRN: [id number]  HPI 02/16/18 Patient is transferring care to me from my partner due to medical complexity.  I have copied relevant portions of her recent office visit for my reference:  Adam Butler is a 63 y.o. male, presents to clinic with CC of hospital followup after admission 01/25/18-01/27/18 for hepatic encephalopathy, cirrhosis secondary to NASH, IDDM with hyperglycemia and hypokalemia.  Pt was fluid overloaded ~ 30 lbs at 270 lbs (lowest recent weight near my first visit with this pt was 240 lbs), altered and tachypneic.  He has his paperwork with him with several medication changes that he has not been compliant with.   Lactulose 30 mg TID, pt states he is taking 10 mg lactulose once nightly.  Pt states that works better for his BM's and it is allowing him to get some sleep and he is less sleepy throughout the daytime.  His insulin was also changed.  His was instructed to do lantus 10 units at night and Novolin R sliding scale TID. He does not want to take lantus, saying it was too expensive in the past, and he has been doing novolin R for years and it is affordable.  He reports morning sugars 122-124 in the am.  A recent low was 80 didn't feel well, sweaty and hungry, 180 highest after eating more at night.   He was discharged from the hospital with decreased spironolactone and increased lasix dose.  He is breathing good, is walking a lot, feeling better.  He was "feeling drunk" and sleeping throughout the day and now he is sleeping better at night and awake during the day more.  But he does not feel like he's not getting enough fluid off his legs despite taking the medicines.  He still has severely swollen feet, ankles and legs, and his abdomen does also feel enlarged with swelling in abdominal skin as well.  He wants to wear compression stockings but is having difficulty getting them on.    GI/Dr. Hilarie Fredrickson -  pt states he saw GI while in the hospital, he has spoken to their office, appointment pending and f/up LUQ [place] pending.   02/16/18 Patient had an EGD performed 01/02/2018 which revealed Candida esophagitis as well as small esophageal varices.  He is not currently taking propranolol.  He is on Levaquin for prevention of spontaneous bacterial peritonitis.  I see no evidence that he is followed up with GI as planned.  His potassium levels remain borderline low.  Magnesium levels remain low.  Patient states he is currently taking lactulose once a day but this is allowing him 5-6 bowel movements a day.  He is taking 40 mg of Lasix once a day and only 25 mg of Spironolactone once a day.  He has +2 pitting edema in both legs.  His weight is still 11 pounds heavier than his baseline when he established with [place].  There are faint bibasilar crackles and obvious ascites on his exam.  He appears fluid overloaded.  At that time, my plan was:  We need to optimize the management of his cirrhosis.  First and foremost I will schedule the patient to follow-up with Dr. Hilarie Fredrickson.  I have recommended that he follow-up with Dr. Hilarie Fredrickson regularly most likely every 3 to 4 months until stable.  Second we need to address his fluid overload.  I would like to get the patient  ultimately on a 100 mg a day of spironolactone if he can tolerate this.  Therefore he will continue Lasix 40 mg a day in the morning.  We will add spironolactone 50 mg p.o. twice daily.  He can discontinue his potassium supplement.  Reassess the patient in 1 week to monitor for evidence of dehydration or hypotension or hyperkalemia.  Next we need to address his esophageal varices to prevent future upper GI bleeds.  If his blood pressure can tolerate the increased dose of spironolactone in 1 week I plan to start the patient on propranolol 20 mg p.o. twice daily.  He will continue Levaquin for SBP prophylaxis.  I have encouraged the patient to titrate his lactulose to achieve  5-6 bowel movements per day.  If he is truly accomplishing this with 1 dose of lactulose then I would not make any changes at the present time.  However I encouraged his wife to monitor the patient and if he is not going to the bathroom regularly she will need to increase the lactulose to 3 times daily.  Patient is currently doing sliding scale insulin.  He is not taking Lantus.  I have asked him to record his pre-meal blood sugars with breakfast lunch and dinner as well as his evening blood sugar and return in 1 week for me to review.  I believe he would do better with basal insulin however I will review his sugars and see how well he is controlling his sugars with his Novolin R.  02/23/18 Wt Readings from Last 3 Encounters:  02/15/19 254 lb (115.2 kg)  01/30/19 250 lb (113.4 kg)  10/31/18 253 lb 9.6 oz (115 kg)   Patient has lost 5 lbs over the last week.  He is approaching his baseline weight.  Patient has an appointment to see the gastroenterologist in October.  While his swelling is better, he is still swollen significantly in his left leg +2, and +1 in his right leg.  His lungs sound better today.  His wife states that once this week, he was demonstrating asterixis and some confusion however she was able to modify his lactulose and avoid taking him to the hospital.  I emphasized that he needs to have 3 bowel movements a day and that we may need to increase his lactulose accordingly to achieve this particularly if he is confused or disoriented.  Today he appears to be at his baseline.  He presents with fasting sugars.  Typically his fasting sugars are 1 40-1 88.  His sugars later in the day typically are between 203 100.  He is unable to tolerate/afford Lantus or basal insulin.  He is using only regular insulin.  We discussed for more than 25 minutes a day his diet.  He is eating fried foods, fast foods, high carbohydrate diet.  He refuses to eat vegetables.  This is based on taste preferences.  However  the patient did not realize that his cirrhosis was due to fatty liver disease.  I explained to him that fast food fried foods high carbohydrate diet contributes this and that this type of diet will expedite medical problems.  Patient states that he was unaware of this.  I also explained to the patient that a high carbohydrate diet exacerbates his blood sugar control.  His blood pressure today however is a little too low I think to add the propranolol for his esophageal varices.  At that time, my plan was: Patient's fluid overload has improved but still not  at his baseline.  Continue spironolactone 50 mg p.o. twice daily and Lasix 40 mg a day.  Recheck a CMP to monitor his renal function and his potassium.  Patient does not appear clinically dehydrated.  He still appears to be fluid overloaded.  Therefore I will continue this dose diuretic and recheck the patient in 1 week.  I will also monitor his magnesium level as this was low last time.  Encourage the patient and his wife to monitor his mental status and his bowel movement frequency.  We need to shoot for at least 3 bowel movements a day and then increase his lactulose dose on any evidence of confusion or constipation.  I also spent a tremendous amount of time explaining a low carbohydrate low saturated fat diet.  This is a means to help control his blood sugars better and also avoid worsening of his fatty liver disease.  At the present time, I feel his liver disease is so precarious that have asked him to stay away from the Lipitor.  If we are able to maintain a steady state, we may reconsider this in the future given his previous medical problems cardiovascular disease etc. however at the present time I do not believe his liver can tolerate any additional toxin.  Check in 1 week  03/02/18 Wt Readings from Last 3 Encounters:  02/15/19 254 lb (115.2 kg)  01/30/19 250 lb (113.4 kg)  10/31/18 253 lb 9.6 oz (115 kg)   Patient lost 9 pounds since last week.   I am concerned that he may be approaching dehydration.  He denies any dizziness or lightheadedness.  In fact he feels much better now.  He states that his shortness of breath has improved and is becoming more active.  His blood pressure today is normal at 120/62.  He denies any fever or abdominal pain.  He denies any melena or hematochezia.  He denies any confusion.  He denies any asterixis.  At that time, my plan was: I believe we need to decrease his dose of diuretic to avoid dehydration.  Continue Lasix 40 mg a day but decrease spironolactone to 25 mg twice a day.  Monitor his weight daily and try to maintain a stable weight consistent with today's weight of 248 pounds.  I explained this in detail to his wife and she will check his weight daily and notify us immediately if he gains or loses more than 2 pounds in a day from this baseline.  We will add propranolol 20 mg p.o. twice daily to prevent bleeding esophageal varices.  He has an appointment scheduled to see his gastroenterologist later this fall.  I recommended a flu shot when they become available.  Recheck his magnesium and potassium however he has not been taking the magnesium I recommended after his last lab draw where his magnesium was found to be low at 1.3  04/30/18 Patient has reestablish with his gastroenterologist.  There are persistent mild elevations in his liver function test.  However his bilirubin remains elevated, his INR remains slightly elevated, his albumin remains depressed secondary to his cirrhosis.  He states that his gastroenterologist told him that he has"approximately 30 to 50% functioning liver".  His cirrhosis is secondary to [place].  We had a long discussion today about the role cholesterol, diabetes, and lifestyle choices play and worsening nonalcoholic steatohepatitis.  Patient continues to eat indiscriminately.  He eats a high carbohydrate diet including jelly toast for breakfast.  His sugars fluctuate wildly.  He  states  some mornings they are 100.  Some mornings they are over 200.  He is taking random doses of insulin depending upon what his sugars are running.  He is self administering his medication.  He is not exercising.  He continues to refuse to eat a diet rich in fruits and vegetables because he does not like the taste.  He continues to consume a high carbohydrate diet was saturated fat.  At that time, my plan was: I do not believe this patient is a good insulin candidate due to noncompliance and the risk of hypoglycemia.  I believe he would be an excellent Trulicity candidate.  I will check a hemoglobin A1c.  If greater than 7 as I expect, I will likely start the patient on Trulicity.  His most recent hemoglobin A1c was checked in April and was found to be greater than 8.  I will also check a fasting lipid panel.  I would like to drive his cholesterol and triglycerides as low as possible without starting medications that are toxic to the liver.  Therefore we had a long discussion today about making healthy dietary choices.  Recommended the flu shot and the patient received this today.  02/15/19 HgA1c was 6.8 in October.  Therefore we never began Trulicity.  Patient has been using insulin on a sliding scale based upon what his sugars are running.  He states that his sugars have been highly variable.  He is seeing them as high as 500 and as low as 80.  Most of his sugars are between 202 50.  Recently was seen by his home health care nurse who performed a hemoglobin A1c associated with his insurance company and it was elevated at 10.4.  He denies any recent weight loss.  He denies any blurry vision.  He denies any polyuria or polydipsia.  Although he has recently had dry mouth.  Patient is very noncompliant.  He also frequently gets confused and therefore sliding scale insulin I believe is very dangerous for this patient.  Based on his cirrhosis, metformin is too risky.  Based on his cirrhosis and fluid retention Actos is  too dangerous.  I do not believe the Januvia would be strong enough for this patient.  Therefore I believe the Trulicity would offer the most robust glycemic control with a less chance of hypoglycemia for this patient  Past Medical History:  Diagnosis Date   Arthritis    Cholelithiasis    Cirrhosis ([place]) 2011   Cryptogenic, Likely NASH. Family/pt deny EtOH. HCV, HBV, HAV negative. ANA negative. AMA positive. Ascites 12/11   Coronary artery disease    Inferior MI 12/11; LHC with occluded mid CFX and 80% proximal RCA. EF 55%. He had 3.0 x 28 vision BMS to CFX   Diastolic CHF, acute ([place])    Echo 12/11 with ef 50-55% and mild LVH. EF 55% by LV0gram in 12/11   Esophageal varices ([place]) 2011, 2013   no hx acute variceal bleed   Hepatic encephalopathy ([place]) 2011, 12/2013   Hyperlipemia    Myocardial infarction [facility]) ? 2012   PFO (patent foramen ovale): Per TEE 04/20/2015 04/20/2015   Portal hypertension ([facility])    Rectal varices    S/P coronary artery stent placement 06/2010   SVT (supraventricular tachycardia) ([place])    1/12: appeared to be an ectopic atrial tachycardia. Required DCCV with hemodynamic instability   Type II diabetes mellitus ([place]) 2011   Past Surgical History:  Procedure Laterality Date  APPENDECTOMY     BIOPSY  01/02/2018   Procedure: BIOPSY;  Surgeon: Jerene Bears, MD;  Location: [facility];  Service: Gastroenterology;;   CARDIAC CATHETERIZATION  07/01/2010   BMS to CFX.   CATARACT EXTRACTION W/PHACO Left 04/28/2016   Procedure: CATARACT EXTRACTION PHACO AND INTRAOCULAR LENS PLACEMENT (IOC);  Surgeon: Leandrew Koyanagi, MD;  Location: [facility] ORS;  Service: Ophthalmology;  Laterality: Left;  Lot# [id number] H [place]: 05:31.7 AP%: 28.2 CDE: 93.48    COLONOSCOPY  04/13/2012   Procedure: COLONOSCOPY;  Surgeon: Inda Castle, MD;  Location: [facility];  Service: Endoscopy;  Laterality: N/A;   COLONOSCOPY WITH PROPOFOL N/A 03/22/2017   Procedure:  COLONOSCOPY WITH PROPOFOL;  Surgeon: Doran Stabler, MD;  Location: [facility];  Service: Gastroenterology;  Laterality: N/A;   COLONOSCOPY WITH PROPOFOL N/A 01/02/2018   Procedure: COLONOSCOPY WITH PROPOFOL;  Surgeon: Jerene Bears, MD;  Location: [facility];  Service: Gastroenterology;  Laterality: N/A;   CORONARY ANGIOPLASTY     CORONARY STENT PLACEMENT  06/30/2010   CFX   Distal         ESOPHAGEAL BANDING N/A 01/01/2014   Procedure: ESOPHAGEAL BANDING;  Surgeon: Jerene Bears, MD;  Location: [place] ENDOSCOPY;  Service: Endoscopy;  Laterality: N/A;   ESOPHAGOGASTRODUODENOSCOPY  04/13/2012   Procedure: ESOPHAGOGASTRODUODENOSCOPY (EGD);  Surgeon: Inda Castle, MD;  Location: [facility];  Service: Endoscopy;  Laterality: N/A;   ESOPHAGOGASTRODUODENOSCOPY N/A 01/01/2014   Procedure: ESOPHAGOGASTRODUODENOSCOPY (EGD);  Surgeon: Jerene Bears, MD;  [facility];  Service: Endoscopy;  Laterality: N/A;   ESOPHAGOGASTRODUODENOSCOPY (EGD) WITH PROPOFOL N/A 07/12/2016   Procedure: ESOPHAGOGASTRODUODENOSCOPY (EGD) WITH PROPOFOL;  Surgeon: Jerene Bears, MD;  Location: [facility];  Service: Gastroenterology;  Laterality: N/A;   ESOPHAGOGASTRODUODENOSCOPY (EGD) WITH PROPOFOL N/A 01/02/2018   Procedure: ESOPHAGOGASTRODUODENOSCOPY (EGD) WITH PROPOFOL;  Surgeon: Jerene Bears, MD;  Location: [facility];  Service: Gastroenterology;  Laterality: N/A;   LEFT HEART CATHETERIZATION WITH CORONARY ANGIOGRAM N/A 10/15/2014   Procedure: LEFT HEART CATHETERIZATION WITH CORONARY ANGIOGRAM;  Surgeon: Peter M Martinique, MD;  Location: [facility] CATH LAB;  Service: Cardiovascular;  Laterality: N/A;   orif r leg     POLYPECTOMY  01/02/2018   Procedure: POLYPECTOMY;  Surgeon: Jerene Bears, MD;  Location: [facility];  Service: Gastroenterology;;   TEE WITHOUT CARDIOVERSION N/A 04/20/2015   Procedure: TRANSESOPHAGEAL ECHOCARDIOGRAM (TEE);  Surgeon: Fay Records, MD;  Location: [place];  Service:  Cardiovascular;  Laterality: N/A;   TEE WITHOUT CARDIOVERSION N/A 05/17/2017   Procedure: TRANSESOPHAGEAL ECHOCARDIOGRAM (TEE);  Surgeon: Josue Hector, MD;  Location: [facility] ENDOSCOPY;  Service: Cardiovascular;  Laterality: N/A;   Current Outpatient Medications on File Prior to Visit  Medication Sig Dispense Refill   cyanocobalamin 1000 MCG tablet Take 1,000 mcg by mouth daily. Vitamin B12     furosemide (LASIX) 20 MG tablet Take 1 tablet (20 mg total) by mouth daily. (Patient taking differently: Take 40 mg by mouth daily. ) 90 tablet 1   insulin regular (NOVOLIN R,HUMULIN R) 100 units/mL injection Inject 2-8 Units into the skin 3 (three) times daily as needed for high blood sugar (CBG >150).      INSULIN SYRINGE 1CC/29G (SAFETY INSULIN SYRINGES) 29G X 1/2" 1 ML MISC 1 application by Does not apply route daily. 30 each 0   lactulose (CONSTULOSE) 10 GM/15ML solution TAKE 45 MLS BY MOUTH THREE TIMES DAILY 2000 mL 3   levofloxacin (LEVAQUIN) 500 MG tablet Take 1 tablet (500 mg  total) by mouth daily. 30 tablet 1   magnesium oxide (MAG-OX) 400 MG tablet Take 1 tablet (400 mg total) by mouth daily. 90 tablet 1   Melatonin 5 MG TABS Take 5 mg by mouth at bedtime.     Multiple Vitamin (MULTIVITAMIN WITH MINERALS) TABS tablet Take 1 tablet by mouth daily.     nitroGLYCERIN (NITROSTAT) 0.4 MG SL tablet Place 1 tablet (0.4 mg total) under the tongue every 5 (five) minutes as needed for chest pain (chest pain). 25 tablet 5   potassium chloride (K-DUR) 10 MEQ tablet Take 1 tablet (10 mEq total) by mouth daily. 90 tablet 1   propranolol (INDERAL) 20 MG tablet Take 1 tablet (20 mg total) by mouth 2 (two) times daily. 60 tablet 5   sildenafil (REVATIO) 20 MG tablet Take 3-5 tablets as needed one hour prior to sexual activity.  Do not use within 72 hrs of nitroglycerin 30 tablet 1   spironolactone (ALDACTONE) 50 MG tablet Take 0.5 tablets (25 mg total) by mouth 2 (two) times daily. 90 tablet 0   No  current facility-administered medications on file prior to visit.    Allergies  Allergen Reactions   Isosorbide Nitrate Other (See Comments)    Nose bleeds   Social History   Socioeconomic History   Marital status: Married    Spouse name: Not on file   Number of children: 3   Years of education: Not on file   Highest education level: Not on file  Occupational History   Occupation: Unemployed    Employer: OTHER    Comment: Worked in Theatre manager prior  Scientist, product/process development strain: Not on file   Food insecurity    Worry: Not on file    Inability: Not on file   Transportation needs    Medical: Not on file    Non-medical: Not on file  Tobacco Use   Smoking status: Current Every Day Smoker    Packs/day: 0.25    Years: 36.00    Pack years: 9.00    Types: E-cigarettes, Cigarettes   Smokeless tobacco: Never Used   Tobacco comment: uses vapor cigarettes (2016 ); I smoke about 4 cigarettes a day  Substance and Sexual Activity   Alcohol use: No    Alcohol/week: 0.0 standard drinks   Drug use: No   Sexual activity: Yes  Lifestyle   Physical activity    Days per week: Not on file    Minutes per session: Not on file   Stress: Not on file  Relationships   Social connections    Talks on phone: Not on file    Gets together: Not on file    Attends religious service: Not on file    Active member of club or organization: Not on file    Attends meetings of clubs or organizations: Not on file    Relationship status: Not on file   Intimate partner violence    Fear of current or ex partner: Not on file    Emotionally abused: Not on file    Physically abused: Not on file    Forced sexual activity: Not on file  Other Topics Concern   Not on file  Social History Narrative   Married   Gets regular exercise: walking    Review of Systems  All other systems reviewed and are negative.      Objective:   Physical Exam Vitals signs reviewed.    Constitutional:  General: He is not in acute distress.    Appearance: He is well-developed. He is not diaphoretic.  Neck:     Vascular: No JVD.  Cardiovascular:     Rate and Rhythm: Normal rate and regular rhythm.     Heart sounds: Murmur present.  Pulmonary:     Effort: No respiratory distress.     Breath sounds: No rales.  Abdominal:     General: Bowel sounds are normal. There is distension.     Palpations: Abdomen is soft. There is no mass.     Tenderness: There is no abdominal tenderness. There is no guarding or rebound.           Assessment & Plan:  The encounter diagnosis was Uncontrolled diabetes mellitus type 2 without complications ([place]). I do not have samples of Trulicity other than 1.5 mg.  We discussed the risk of nausea but the patient is willing to try Trulicity 1.5 mg subcu weekly so that he can use my free samples.  I have sufficient samples to last 1 month.  The patient will administer 1.5 mg of Trulicity subcu weekly on Fridays.  He received his first dose with teaching and education on administration technique today under the supervision of the medical staff.  I will check a CMP and a repeat hemoglobin A1c to ensure accuracy of the 1 that was obtained for him by his insurance company.  Reassess in an office visit in 1 month.

## 2019-02-16 LAB — COMPLETE METABOLIC PANEL WITH GFR
AG Ratio: 0.7 (calc) — ABNORMAL LOW (ref 1.0–2.5)
ALT: 41 U/L (ref 9–46)
AST: 71 U/L — ABNORMAL HIGH (ref 10–35)
Albumin: 2.6 g/dL — ABNORMAL LOW (ref 3.6–5.1)
Alkaline phosphatase (APISO): 175 U/L — ABNORMAL HIGH (ref 35–144)
BUN/Creatinine Ratio: 9 (calc) (ref 6–22)
BUN: 12 mg/dL (ref 7–25)
CO2: 24 mmol/L (ref 20–32)
Calcium: 9.3 mg/dL (ref 8.6–10.3)
Chloride: 106 mmol/L (ref 98–110)
Creat: 1.34 mg/dL — ABNORMAL HIGH (ref 0.70–1.25)
GFR, Est African American: 65 mL/min/{1.73_m2} (ref >=60)
GFR, Est Non African American: 56 mL/min/{1.73_m2} — ABNORMAL LOW (ref >=60)
Globulin: 3.5 g/dL (calc) (ref 1.9–3.7)
Glucose, Bld: 83 mg/dL (ref 65–99)
Potassium: 3.8 mmol/L (ref 3.5–5.3)
Sodium: 137 mmol/L (ref 135–146)
Total Bilirubin: 3.4 mg/dL — ABNORMAL HIGH (ref 0.2–1.2)
Total Protein: 6.1 g/dL (ref 6.1–8.1)

## 2019-02-16 LAB — HEMOGLOBIN A1C
Hgb A1c MFr Bld: 11.6 % of total Hgb — ABNORMAL HIGH (ref <5.7)
Mean Plasma Glucose: 286 (calc)
eAG (mmol/L): 15.9 (calc)

## 2019-02-18 ENCOUNTER — Other Ambulatory Visit: Payer: Self-pay | Admitting: Family Medicine

## 2019-02-18 MED ORDER — EMPAGLIFLOZIN 25 MG PO TABS: 25.0000 mg | ORAL_TABLET | Freq: Every day | ORAL | 3 refills | Status: DC

## 2019-02-22 ENCOUNTER — Other Ambulatory Visit: Payer: Self-pay | Admitting: Family Medicine

## 2019-02-22 MED ORDER — JARDIANCE 25 MG PO TABS: 25.0000 mg | ORAL_TABLET | Freq: Every day | ORAL | 3 refills | Status: DC

## 2019-02-23 DIAGNOSIS — E119 Type 2 diabetes mellitus without complications: Secondary | ICD-10-CM | POA: Diagnosis not present

## 2019-03-05 ENCOUNTER — Other Ambulatory Visit: Payer: Self-pay | Admitting: Internal Medicine

## 2019-03-08 ENCOUNTER — Ambulatory Visit (HOSPITAL_COMMUNITY): Payer: PPO

## 2019-03-12 ENCOUNTER — Ambulatory Visit: Payer: PPO | Admitting: Internal Medicine

## 2019-03-19 ENCOUNTER — Encounter: Payer: Self-pay | Admitting: Family Medicine

## 2019-03-19 ENCOUNTER — Ambulatory Visit (INDEPENDENT_AMBULATORY_CARE_PROVIDER_SITE_OTHER): Payer: PPO | Admitting: Family Medicine

## 2019-03-19 ENCOUNTER — Other Ambulatory Visit: Payer: Self-pay

## 2019-03-19 VITALS — BP 116/62 | HR 53 | Temp 97.6°F | Resp 15 | Ht 66.0 in | Wt 251.0 lb

## 2019-03-19 DIAGNOSIS — E1165 Type 2 diabetes mellitus with hyperglycemia: Secondary | ICD-10-CM | POA: Diagnosis not present

## 2019-03-19 DIAGNOSIS — IMO0001 Reserved for inherently not codable concepts without codable children: Secondary | ICD-10-CM

## 2019-03-19 MED ORDER — TRULICITY 1.5 MG/0.5ML ~~LOC~~ SOAJ: 1.5000 mg | SUBCUTANEOUS | 11 refills | Status: AC

## 2019-03-19 MED ORDER — NOVOLIN 70/30 (70-30) 100 UNIT/ML ~~LOC~~ SUSP: SUBCUTANEOUS | 11 refills | Status: AC

## 2019-03-19 NOTE — Progress Notes (Signed)
Subjective:    Patient ID: Adam Butler, male    DOB: [birth year], 63 y.o.   MRN: [id number]  HPI See the last office visit.  At that time we started patient on Trulicity.  Previously he was taking regular insulin on a sliding scale that he created himself.  He would take it 3 times a day depending upon his sugars.  He averaged that he was taking near 30 units of insulin a day in divided doses.  His hemoglobin A1c was greater than 11.  We added Trulicity and increase him to 1.5 mg weekly.  He also changed his diet.  In the last week, his fasting blood sugars have been between 100-180.  2-hour postprandial sugars are less than 240.  Sugars are still elevated but much better.  He has stopped eating junk food and fried food and sweets and sugars.  Past Medical History:  Diagnosis Date  . Arthritis   . Cholelithiasis   . Cirrhosis ([place]) 2011   Cryptogenic, Likely NASH. Family/pt deny EtOH. HCV, HBV, HAV negative. ANA negative. AMA positive. Ascites 12/11  . Coronary artery disease    Inferior MI 12/11; LHC with occluded mid CFX and 80% proximal RCA. EF 55%. He had 3.0 x 28 vision BMS to CFX  . Diastolic CHF, acute ([place])    Echo 12/11 with ef 50-55% and mild LVH. EF 55% by LV0gram in 12/11  . Esophageal varices ([place]) 2011, 2013   no hx acute variceal bleed  . Hepatic encephalopathy ([place]) 2011, 12/2013  . Hyperlipemia   . Myocardial infarction [facility]) ? 2012  . PFO (patent foramen ovale): Per TEE 04/20/2015 04/20/2015  . Portal hypertension ([place])   . Rectal varices   . S/P coronary artery stent placement 06/2010  . SVT (supraventricular tachycardia) ([place])    1/12: appeared to be an ectopic atrial tachycardia. Required DCCV with hemodynamic instability  . Type II diabetes mellitus ([place]) 2011   Past Surgical History:  Procedure Laterality Date  . APPENDECTOMY    . BIOPSY  01/02/2018   Procedure: BIOPSY;  Surgeon: Jerene Bears, MD;  [facility];  Service: Gastroenterology;;  .  CARDIAC CATHETERIZATION  07/01/2010   BMS to CFX.  Marland Kitchen CATARACT EXTRACTION W/PHACO Left 04/28/2016   Procedure: CATARACT EXTRACTION PHACO AND INTRAOCULAR LENS PLACEMENT (IOC);  Surgeon: Leandrew Koyanagi, MD;  Location: [facility] ORS;  Service: Ophthalmology;  Laterality: Left;  Lot# [id number] H [place]: 05:31.7 AP%: 28.2 CDE: 93.48   . COLONOSCOPY  04/13/2012   Procedure: COLONOSCOPY;  Surgeon: Inda Castle, MD;  [facility];  Service: Endoscopy;  Laterality: N/A;  . COLONOSCOPY WITH PROPOFOL N/A 03/22/2017   Procedure: COLONOSCOPY WITH PROPOFOL;  Surgeon: Doran Stabler, MD;  [facility];  Service: Gastroenterology;  Laterality: N/A;  . COLONOSCOPY WITH PROPOFOL N/A 01/02/2018   Procedure: COLONOSCOPY WITH PROPOFOL;  Surgeon: Jerene Bears, MD;  [facility];  Service: Gastroenterology;  Laterality: N/A;  . CORONARY ANGIOPLASTY    . CORONARY STENT PLACEMENT  06/30/2010   CFX   Distal        . ESOPHAGEAL BANDING N/A 01/01/2014   Procedure: ESOPHAGEAL BANDING;  Surgeon: Jerene Bears, MD;  Location: [place] ENDOSCOPY;  Service: Endoscopy;  Laterality: N/A;  . ESOPHAGOGASTRODUODENOSCOPY  04/13/2012   Procedure: ESOPHAGOGASTRODUODENOSCOPY (EGD);  Surgeon: Inda Castle, MD;  [facility];  Service: Endoscopy;  Laterality: N/A;  . ESOPHAGOGASTRODUODENOSCOPY N/A 01/01/2014   Procedure: ESOPHAGOGASTRODUODENOSCOPY (EGD);  Surgeon: Ulice Dash  Everitt Amber, MD;  Location: [place];  Service: Endoscopy;  Laterality: N/A;  . ESOPHAGOGASTRODUODENOSCOPY (EGD) WITH PROPOFOL N/A 07/12/2016   Procedure: ESOPHAGOGASTRODUODENOSCOPY (EGD) WITH PROPOFOL;  Surgeon: Jerene Bears, MD;  [facility];  Service: Gastroenterology;  Laterality: N/A;  . ESOPHAGOGASTRODUODENOSCOPY (EGD) WITH PROPOFOL N/A 01/02/2018   Procedure: ESOPHAGOGASTRODUODENOSCOPY (EGD) WITH PROPOFOL;  Surgeon: Jerene Bears, MD;  [facility];  Service: Gastroenterology;  Laterality: N/A;  . LEFT HEART  CATHETERIZATION WITH CORONARY ANGIOGRAM N/A 10/15/2014   Procedure: LEFT HEART CATHETERIZATION WITH CORONARY ANGIOGRAM;  Surgeon: Peter M Martinique, MD;  Location: [facility] CATH LAB;  Service: Cardiovascular;  Laterality: N/A;  . orif r leg    . POLYPECTOMY  01/02/2018   Procedure: POLYPECTOMY;  Surgeon: Jerene Bears, MD;  [facility];  Service: Gastroenterology;;  . TEE WITHOUT CARDIOVERSION N/A 04/20/2015   Procedure: TRANSESOPHAGEAL ECHOCARDIOGRAM (TEE);  Surgeon: Fay Records, MD;  Location: [place];  Service: Cardiovascular;  Laterality: N/A;  . TEE WITHOUT CARDIOVERSION N/A 05/17/2017   Procedure: TRANSESOPHAGEAL ECHOCARDIOGRAM (TEE);  Surgeon: Josue Hector, MD;  Location: [facility];  Service: Cardiovascular;  Laterality: N/A;   Current Outpatient Medications on File Prior to Visit  Medication Sig Dispense Refill  . cyanocobalamin 1000 MCG tablet Take 1,000 mcg by mouth daily. Vitamin B12    . furosemide (LASIX) 20 MG tablet Take 1 tablet (20 mg total) by mouth daily. (Patient taking differently: Take 40 mg by mouth daily. ) 90 tablet 1  . insulin regular (NOVOLIN R,HUMULIN R) 100 units/mL injection Inject 2-8 Units into the skin 3 (three) times daily as needed for high blood sugar (CBG >150).     . INSULIN SYRINGE 1CC/29G (SAFETY INSULIN SYRINGES) 29G X 1/2" 1 ML MISC 1 application by Does not apply route daily. 30 each 0  . lactulose (CONSTULOSE) 10 GM/15ML solution TAKE 45 MLS BY MOUTH THREE TIMES DAILY 2000 mL 3  . levofloxacin (LEVAQUIN) 500 MG tablet Take 1 tablet by mouth once daily 30 tablet 0  . magnesium oxide (MAG-OX) 400 MG tablet Take 1 tablet (400 mg total) by mouth daily. 90 tablet 1  . Multiple Vitamin (MULTIVITAMIN WITH MINERALS) TABS tablet Take 1 tablet by mouth daily.    . potassium chloride (K-DUR) 10 MEQ tablet Take 1 tablet (10 mEq total) by mouth daily. 90 tablet 1  . propranolol (INDERAL) 20 MG tablet Take 1 tablet (20 mg total) by mouth 2 (two) times  daily. 60 tablet 5  . sildenafil (REVATIO) 20 MG tablet Take 3-5 tablets as needed one hour prior to sexual activity.  Do not use within 72 hrs of nitroglycerin 30 tablet 1  . spironolactone (ALDACTONE) 50 MG tablet Take 0.5 tablets (25 mg total) by mouth 2 (two) times daily. 90 tablet 0  . nitroGLYCERIN (NITROSTAT) 0.4 MG SL tablet Place 1 tablet (0.4 mg total) under the tongue every 5 (five) minutes as needed for chest pain (chest pain). (Patient not taking: Reported on 03/19/2019) 25 tablet 5   No current facility-administered medications on file prior to visit.    Allergies  Allergen Reactions  . Isosorbide Nitrate Other (See Comments)    Nose bleeds   Social History   Socioeconomic History  . Marital status: Married    Spouse name: Not on file  . Number of children: 3  . Years of education: Not on file  . Highest education level: Not on file  Occupational History  . Occupation: Unemployed  Employer: OTHER    Comment: Worked in maintenance prior  Social Needs  . Financial resource strain: Not on file  . Food insecurity    Worry: Not on file    Inability: Not on file  . Transportation needs    Medical: Not on file    Non-medical: Not on file  Tobacco Use  . Smoking status: Current Every Day Smoker    Packs/day: 0.25    Years: 36.00    Pack years: 9.00    Types: E-cigarettes, Cigarettes  . Smokeless tobacco: Never Used  . Tobacco comment: uses vapor cigarettes (2016 ); I smoke about 4 cigarettes a day  Substance and Sexual Activity  . Alcohol use: No    Alcohol/week: 0.0 standard drinks  . Drug use: No  . Sexual activity: Yes  Lifestyle  . Physical activity    Days per week: Not on file    Minutes per session: Not on file  . Stress: Not on file  Relationships  . Social Herbalist on phone: Not on file    Gets together: Not on file    Attends religious service: Not on file    Active member of club or organization: Not on file    Attends meetings of  clubs or organizations: Not on file    Relationship status: Not on file  . Intimate partner violence    Fear of current or ex partner: Not on file    Emotionally abused: Not on file    Physically abused: Not on file    Forced sexual activity: Not on file  Other Topics Concern  . Not on file  Social History Narrative   Married   Gets regular exercise: walking      Review of Systems  All other systems reviewed and are negative.      Objective:   Physical Exam Vitals signs reviewed.  Constitutional:      Appearance: He is obese.  Cardiovascular:     Rate and Rhythm: Normal rate and regular rhythm.     Heart sounds: Normal heart sounds.  Pulmonary:     Effort: Pulmonary effort is normal.     Breath sounds: Normal breath sounds. No wheezing, rhonchi or rales.  Musculoskeletal:     Right lower leg: No edema.     Left lower leg: No edema.  Neurological:     Mental Status: He is alert.           Assessment & Plan:  Uncontrolled diabetes mellitus type 2 without complications [facility])  Patient never started Jardiance as I recommended.  He is currently only on Trulicity and then fast acting insulin per his own random sliding scale.  I recommended that he continue Trulicity 1.5 mg weekly.  We will discontinue Novolin R.  We will replace this with Novolin 70/30.  He will take 20 units in the morning with breakfast and 10 units in the afternoon with supper.  We will uptitrate his 70/30 depending upon his blood sugars.  I picked 30 units total as this was his total daily dose of regular insulin.  Therefore I believe that this will not cause hypoglycemia as he is already taking roughly 30 units scattered randomly throughout the day on average.

## 2019-03-22 ENCOUNTER — Ambulatory Visit: Payer: PPO

## 2019-03-31 ENCOUNTER — Other Ambulatory Visit: Payer: Self-pay | Admitting: Family Medicine

## 2019-04-04 ENCOUNTER — Other Ambulatory Visit: Payer: Self-pay | Admitting: Internal Medicine

## 2019-04-08 ENCOUNTER — Other Ambulatory Visit: Payer: Self-pay

## 2019-04-08 ENCOUNTER — Ambulatory Visit (HOSPITAL_COMMUNITY)
Admission: RE | Admit: 2019-04-08 | Discharge: 2019-04-08 | Disposition: A | Discharge status: Home / Self Care | Payer: PPO | Source: Ambulatory Visit | Attending: Internal Medicine | Admitting: Internal Medicine

## 2019-04-08 ENCOUNTER — Other Ambulatory Visit: Payer: Self-pay | Admitting: Family Medicine

## 2019-04-08 DIAGNOSIS — K7460 Unspecified cirrhosis of liver: Secondary | ICD-10-CM | POA: Diagnosis not present

## 2019-04-08 DIAGNOSIS — K8020 Calculus of gallbladder without cholecystitis without obstruction: Secondary | ICD-10-CM | POA: Diagnosis not present

## 2019-04-08 DIAGNOSIS — N200 Calculus of kidney: Secondary | ICD-10-CM | POA: Diagnosis not present

## 2019-04-08 MED ORDER — POTASSIUM CHLORIDE ER 10 MEQ PO TBCR: 10.0000 meq | EXTENDED_RELEASE_TABLET | Freq: Every day | ORAL | 1 refills | Status: AC

## 2019-04-08 MED ORDER — FUROSEMIDE 20 MG PO TABS: 20.0000 mg | ORAL_TABLET | Freq: Every day | ORAL | 1 refills | Status: AC

## 2019-04-10 ENCOUNTER — Encounter (HOSPITAL_COMMUNITY): Payer: Self-pay | Admitting: Emergency Medicine

## 2019-04-10 ENCOUNTER — Other Ambulatory Visit: Payer: Self-pay

## 2019-04-10 ENCOUNTER — Ambulatory Visit (HOSPITAL_COMMUNITY)
Admission: EM | Admit: 2019-04-10 | Discharge: 2019-04-10 | Disposition: A | Discharge status: Home / Self Care | Payer: PPO | Attending: Urgent Care | Admitting: Urgent Care

## 2019-04-10 ENCOUNTER — Ambulatory Visit (INDEPENDENT_AMBULATORY_CARE_PROVIDER_SITE_OTHER): Payer: PPO

## 2019-04-10 DIAGNOSIS — K7460 Unspecified cirrhosis of liver: Secondary | ICD-10-CM

## 2019-04-10 DIAGNOSIS — E1165 Type 2 diabetes mellitus with hyperglycemia: Secondary | ICD-10-CM

## 2019-04-10 DIAGNOSIS — I2510 Atherosclerotic heart disease of native coronary artery without angina pectoris: Secondary | ICD-10-CM

## 2019-04-10 DIAGNOSIS — M25432 Effusion, left wrist: Secondary | ICD-10-CM

## 2019-04-10 DIAGNOSIS — S52572A Other intraarticular fracture of lower end of left radius, initial encounter for closed fracture: Secondary | ICD-10-CM | POA: Diagnosis not present

## 2019-04-10 DIAGNOSIS — W19XXXA Unspecified fall, initial encounter: Secondary | ICD-10-CM

## 2019-04-10 DIAGNOSIS — M25532 Pain in left wrist: Secondary | ICD-10-CM | POA: Diagnosis not present

## 2019-04-10 MED ORDER — TRAMADOL HCL 50 MG PO TABS: 50.0000 mg | ORAL_TABLET | Freq: Two times a day (BID) | ORAL | 0 refills | Status: DC | PRN

## 2019-04-10 NOTE — ED Notes (Signed)
Ortho Tech called, spoke with Luvenia Heller, RN.  They will be here after they finish with their current pt.

## 2019-04-10 NOTE — ED Notes (Signed)
Ortho Tech paged for short arm splint.

## 2019-04-10 NOTE — Progress Notes (Signed)
Orthopedic Tech Progress Note Patient Details:  Adam Butler [birth year] [id number]  Ortho Devices Type of Ortho Device: Ace wrap, Sugartong splint, Arm sling Ortho Device/Splint Location: left Ortho Device/Splint Interventions: Application   Post Interventions Patient Tolerated: Well Instructions Provided: Care of device   [name] 04/10/2019, 1:43 PM

## 2019-04-10 NOTE — ED Triage Notes (Signed)
Reports falling yesterday at church because he was not using his cane.  Pt fell on his left hand.  Pt has swelling and redness to the left forearm closest to his hand and he states that is where the pain is located.  Pt is able to make a fist but cannot bend the wrist.

## 2019-04-10 NOTE — ED Provider Notes (Signed)
MRN: [id number] DOB: May 06, 1956  Subjective:   Adam Butler is a 63 y.o. male presenting for 1 day history of suffering a left wrist injury.  Patient normally ambulates with his cane but states that yesterday he for got to bring it along with his friends.  They went to the flea market and walked a very long distance.  Unfortunately patient got really tired, ended up tripping when he was walking over gravel and fell breaking his fall with his left hand and forearm.  He has since had persistent worsening left wrist pain and swelling, bruising as well.  He has tried ibuprofen without relief.  Patient has a history of severely uncontrolled diabetes, last A1c was greater than 9%.  Patient also has a history of liver cirrhosis due to [facility], CAD, history of MI, CHF.  Patient denies that he was feeling any kind of chest pain, shortness of breath, heart racing, nausea, vomiting, belly pain, confusion, headache preceding his fall or associated about his fall.  No current facility-administered medications for this encounter.   Current Outpatient Medications:    Dulaglutide (TRULICITY) 1.5 VF/6.4PP SOPN, Inject 1.5 mg into the skin once a week., Disp: 4 pen, Rfl: 11   furosemide (LASIX) 20 MG tablet, Take 1 tablet (20 mg total) by mouth daily., Disp: 90 tablet, Rfl: 1   insulin NPH-regular Human (NOVOLIN 70/30) (70-30) 100 UNIT/ML injection, 20 units in AM, 10 units in PM, Disp: 10 mL, Rfl: 11   insulin regular (NOVOLIN R,HUMULIN R) 100 units/mL injection, Inject 2-8 Units into the skin 3 (three) times daily as needed for high blood sugar (CBG >150). , Disp: , Rfl:    INSULIN SYRINGE 1CC/29G (SAFETY INSULIN SYRINGES) 29G X 1/2" 1 ML MISC, 1 application by Does not apply route daily., Disp: 30 each, Rfl: 0   lactulose (CONSTULOSE) 10 GM/15ML solution, TAKE 45 MLS BY MOUTH THREE TIMES DAILY, Disp: 2000 mL, Rfl: 3   levofloxacin (LEVAQUIN) 500 MG tablet, Take 1 tablet by mouth once daily, Disp: 30 tablet,  Rfl: 0   magnesium oxide (MAG-OX) 400 MG tablet, Take 1 tablet (400 mg total) by mouth daily., Disp: 90 tablet, Rfl: 1   Multiple Vitamin (MULTIVITAMIN WITH MINERALS) TABS tablet, Take 1 tablet by mouth daily., Disp: , Rfl:    potassium chloride (K-DUR) 10 MEQ tablet, Take 1 tablet (10 mEq total) by mouth daily., Disp: 90 tablet, Rfl: 1   propranolol (INDERAL) 20 MG tablet, Take 1 tablet by mouth twice daily, Disp: 180 tablet, Rfl: 3   spironolactone (ALDACTONE) 50 MG tablet, Take 0.5 tablets (25 mg total) by mouth 2 (two) times daily., Disp: 90 tablet, Rfl: 0   cyanocobalamin 1000 MCG tablet, Take 1,000 mcg by mouth daily. Vitamin B12, Disp: , Rfl:    nitroGLYCERIN (NITROSTAT) 0.4 MG SL tablet, Place 1 tablet (0.4 mg total) under the tongue every 5 (five) minutes as needed for chest pain (chest pain). (Patient not taking: Reported on 03/19/2019), Disp: 25 tablet, Rfl: 5   sildenafil (REVATIO) 20 MG tablet, Take 3-5 tablets as needed one hour prior to sexual activity.  Do not use within 72 hrs of nitroglycerin, Disp: 30 tablet, Rfl: 1    Allergies  Allergen Reactions   Isosorbide Nitrate Other (See Comments)    Nose bleeds    Past Medical History:  Diagnosis Date   Arthritis    Cholelithiasis    Cirrhosis ([place]) 2011   Cryptogenic, Likely NASH. Family/pt deny EtOH. HCV, HBV, HAV negative.  ANA negative. AMA positive. Ascites 12/11   Coronary artery disease    Inferior MI 12/11; LHC with occluded mid CFX and 80% proximal RCA. EF 55%. He had 3.0 x 28 vision BMS to CFX   Diastolic CHF, acute ([place])    Echo 12/11 with ef 50-55% and mild LVH. EF 55% by LV0gram in 12/11   Esophageal varices ([place]) 2011, 2013   no hx acute variceal bleed   Hepatic encephalopathy ([place]) 2011, 12/2013   Hyperlipemia    Myocardial infarction [facility]) ? 2012   PFO (patent foramen ovale): Per TEE 04/20/2015 04/20/2015   Portal hypertension ([facility])    Rectal varices    S/P coronary artery stent  placement 06/2010   SVT (supraventricular tachycardia) ([place])    1/12: appeared to be an ectopic atrial tachycardia. Required DCCV with hemodynamic instability   Type II diabetes mellitus ([place]) 2011     Past Surgical History:  Procedure Laterality Date   APPENDECTOMY     BIOPSY  01/02/2018   Procedure: BIOPSY;  Surgeon: Jerene Bears, MD;  [facility];  Service: Gastroenterology;;   CARDIAC CATHETERIZATION  07/01/2010   BMS to CFX.   CATARACT EXTRACTION W/PHACO Left 04/28/2016   Procedure: CATARACT EXTRACTION PHACO AND INTRAOCULAR LENS PLACEMENT (IOC);  Surgeon: Leandrew Koyanagi, MD;  Location: [facility] ORS;  Service: Ophthalmology;  Laterality: Left;  Lot# [id number] H [place]: 05:31.7 AP%: 28.2 CDE: 93.48    COLONOSCOPY  04/13/2012   Procedure: COLONOSCOPY;  Surgeon: Inda Castle, MD;  [facility];  Service: Endoscopy;  Laterality: N/A;   COLONOSCOPY WITH PROPOFOL N/A 03/22/2017   Procedure: COLONOSCOPY WITH PROPOFOL;  Surgeon: Doran Stabler, MD;  [facility];  Service: Gastroenterology;  Laterality: N/A;   COLONOSCOPY WITH PROPOFOL N/A 01/02/2018   Procedure: COLONOSCOPY WITH PROPOFOL;  Surgeon: Jerene Bears, MD;  [facility];  Service: Gastroenterology;  Laterality: N/A;   CORONARY ANGIOPLASTY     CORONARY STENT PLACEMENT  06/30/2010   CFX   Distal         ESOPHAGEAL BANDING N/A 01/01/2014   Procedure: ESOPHAGEAL BANDING;  Surgeon: Jerene Bears, MD;  Location: [place] ENDOSCOPY;  Service: Endoscopy;  Laterality: N/A;   ESOPHAGOGASTRODUODENOSCOPY  04/13/2012   Procedure: ESOPHAGOGASTRODUODENOSCOPY (EGD);  Surgeon: Inda Castle, MD;  [facility];  Service: Endoscopy;  Laterality: N/A;   ESOPHAGOGASTRODUODENOSCOPY N/A 01/01/2014   Procedure: ESOPHAGOGASTRODUODENOSCOPY (EGD);  Surgeon: Jerene Bears, MD;  Location[facility] ENDOSCOPY;  Service: Endoscopy;  Laterality: N/A;   ESOPHAGOGASTRODUODENOSCOPY (EGD) WITH PROPOFOL N/A  07/12/2016   Procedure: ESOPHAGOGASTRODUODENOSCOPY (EGD) WITH PROPOFOL;  Surgeon: Jerene Bears, MD;  [facility];  Service: Gastroenterology;  Laterality: N/A;   ESOPHAGOGASTRODUODENOSCOPY (EGD) WITH PROPOFOL N/A 01/02/2018   Procedure: ESOPHAGOGASTRODUODENOSCOPY (EGD) WITH PROPOFOL;  Surgeon: Jerene Bears, MD;  [facility];  Service: Gastroenterology;  Laterality: N/A;   LEFT HEART CATHETERIZATION WITH CORONARY ANGIOGRAM N/A 10/15/2014   Procedure: LEFT HEART CATHETERIZATION WITH CORONARY ANGIOGRAM;  Surgeon: Peter M Martinique, MD;  Location: [facility] CATH LAB;  Service: Cardiovascular;  Laterality: N/A;   orif r leg     POLYPECTOMY  01/02/2018   Procedure: POLYPECTOMY;  Surgeon: Jerene Bears, MD;  [facility];  Service: Gastroenterology;;   TEE WITHOUT CARDIOVERSION N/A 04/20/2015   Procedure: TRANSESOPHAGEAL ECHOCARDIOGRAM (TEE);  Surgeon: Fay Records, MD;  Location: [facility] ENDOSCOPY;  Service: Cardiovascular;  Laterality: N/A;   TEE WITHOUT CARDIOVERSION N/A 05/17/2017   Procedure: TRANSESOPHAGEAL  ECHOCARDIOGRAM (TEE);  Surgeon: Josue Hector, MD;  Location: [facility] ENDOSCOPY;  Service: Cardiovascular;  Laterality: N/A;    ROS  Objective:   Vitals: BP (!) 111/48 (BP Location: Right Arm)    Pulse (!) 58    Temp 98.4 F (36.9 C) (Oral)    Resp 18    SpO2 99%   Physical Exam Constitutional:      Appearance: Normal appearance. He is well-developed and normal weight.  HENT:     Head: Normocephalic and atraumatic.     Right Ear: External ear normal.     Left Ear: External ear normal.     Nose: Nose normal.     Mouth/Throat:     Pharynx: Oropharynx is clear.  Eyes:     Extraocular Movements: Extraocular movements intact.     Pupils: Pupils are equal, round, and reactive to light.  Cardiovascular:     Rate and Rhythm: Normal rate.  Pulmonary:     Effort: Pulmonary effort is normal.  Musculoskeletal:     Left wrist: He exhibits decreased range of motion,  tenderness (over areas outlined), bony tenderness and swelling (2+). He exhibits no effusion, no crepitus, no deformity and no laceration.       Arms:  Neurological:     Mental Status: He is alert and oriented to person, place, and time.  Psychiatric:        Mood and Affect: Mood normal.        Behavior: Behavior normal.      Dg Wrist Complete Left  Result Date: 04/10/2019 CLINICAL DATA:  Left wrist pain after fall yesterday. EXAM: LEFT WRIST - COMPLETE 3+ VIEW COMPARISON:  None. FINDINGS: Moderately displaced and comminuted fracture is seen involving the distal left radius with intra-articular extension. No other bony abnormality is noted. Joint spaces are intact. No soft tissue abnormality is noted. IMPRESSION: Moderately displaced and comminuted distal left radial fracture with intra-articular extension. Electronically Signed   By: Marijo Conception M.D.   On: 04/10/2019 11:54     Assessment and Plan :   1. Other closed intra-articular fracture of distal end of left radius, initial encounter   2. Acute pain of left wrist   3. Pain and swelling of left wrist   4. Fall, initial encounter   5. Uncontrolled type 2 diabetes mellitus with hyperglycemia ([place])   6. Cirrhosis of liver without ascites, unspecified hepatic cirrhosis type ([place])   7. Coronary artery disease without angina pectoris, unspecified vessel or lesion type, unspecified whether native or transplanted heart     Discussed case with PA [name].  Patient will be placed in sugar tong splint, follow-up ASAP with [facility].  We will have patient use tramadol twice daily as needed for pain control. Counseled patient on potential for adverse effects with medications prescribed/recommended today, ER and return-to-clinic precautions discussed, patient verbalized understanding.    Jaynee Eagles, PA-C 04/10/19 1320

## 2019-04-11 DIAGNOSIS — S52592A Other fractures of lower end of left radius, initial encounter for closed fracture: Secondary | ICD-10-CM | POA: Diagnosis not present

## 2019-04-12 ENCOUNTER — Encounter (HOSPITAL_BASED_OUTPATIENT_CLINIC_OR_DEPARTMENT_OTHER)
Admission: RE | Admit: 2019-04-12 | Discharge: 2019-04-12 | Disposition: A | Discharge status: Home / Self Care | Payer: PPO | Source: Ambulatory Visit | Attending: Orthopedic Surgery | Admitting: Orthopedic Surgery

## 2019-04-12 ENCOUNTER — Other Ambulatory Visit: Payer: Self-pay

## 2019-04-12 ENCOUNTER — Encounter (HOSPITAL_BASED_OUTPATIENT_CLINIC_OR_DEPARTMENT_OTHER): Payer: Self-pay | Admitting: *Deleted

## 2019-04-12 DIAGNOSIS — Z01812 Encounter for preprocedural laboratory examination: Secondary | ICD-10-CM | POA: Diagnosis not present

## 2019-04-12 DIAGNOSIS — S52572A Other intraarticular fracture of lower end of left radius, initial encounter for closed fracture: Secondary | ICD-10-CM | POA: Diagnosis not present

## 2019-04-12 DIAGNOSIS — E119 Type 2 diabetes mellitus without complications: Secondary | ICD-10-CM | POA: Diagnosis not present

## 2019-04-12 LAB — COMPREHENSIVE METABOLIC PANEL
ALT: 41 U/L (ref 0–44)
AST: 55 U/L — ABNORMAL HIGH (ref 15–41)
Albumin: 2.3 g/dL — ABNORMAL LOW (ref 3.5–5.0)
Alkaline Phosphatase: 159 U/L — ABNORMAL HIGH (ref 38–126)
Anion gap: 8 (ref 5–15)
BUN: 10 mg/dL (ref 8–23)
CO2: 24 mmol/L (ref 22–32)
Calcium: 8 mg/dL — ABNORMAL LOW (ref 8.9–10.3)
Chloride: 101 mmol/L (ref 98–111)
Creatinine, Ser: 0.84 mg/dL (ref 0.61–1.24)
GFR calc Af Amer: >60 mL/min (ref >60)
GFR calc non Af Amer: >60 mL/min (ref >60)
Glucose, Bld: 396 mg/dL — ABNORMAL HIGH (ref 70–99)
Potassium: 4.1 mmol/L (ref 3.5–5.1)
Sodium: 133 mmol/L — ABNORMAL LOW (ref 135–145)
Total Bilirubin: 2.6 mg/dL — ABNORMAL HIGH (ref 0.3–1.2)
Total Protein: 5.9 g/dL — ABNORMAL LOW (ref 6.5–8.1)

## 2019-04-12 NOTE — Progress Notes (Signed)
Pt's chart, labs and ECHO reviewed with Dr Deatra Canter, [place] [facility]

## 2019-04-13 ENCOUNTER — Other Ambulatory Visit (HOSPITAL_COMMUNITY)
Admission: RE | Admit: 2019-04-13 | Discharge: 2019-04-13 | Disposition: A | Discharge status: Home / Self Care | Payer: PPO | Source: Ambulatory Visit | Attending: Orthopedic Surgery | Admitting: Orthopedic Surgery

## 2019-04-13 DIAGNOSIS — Z01812 Encounter for preprocedural laboratory examination: Secondary | ICD-10-CM | POA: Insufficient documentation

## 2019-04-13 DIAGNOSIS — Z20828 Contact with and (suspected) exposure to other viral communicable diseases: Secondary | ICD-10-CM | POA: Diagnosis not present

## 2019-04-15 LAB — NOVEL CORONAVIRUS, NAA (HOSP ORDER, SEND-OUT TO REF LAB; TAT 18-24 HRS): SARS-CoV-2, NAA: NOT DETECTED

## 2019-04-15 NOTE — Progress Notes (Signed)
Glucose-396, reviewed by Dr. Eligha Bridegroom, will move case to Main OR. Contacted Jennifer at Dr. Lequita Asal office.

## 2019-04-16 ENCOUNTER — Other Ambulatory Visit: Payer: Self-pay

## 2019-04-16 ENCOUNTER — Encounter (HOSPITAL_COMMUNITY): Payer: Self-pay | Admitting: *Deleted

## 2019-04-16 NOTE — H&P (Signed)
Adam Butler is an 63 y.o. male.   Chief Complaint: LEFT WRIST INJURY   HPI: The patient is a 63 year old right-hand dominant male who fell on 04/09/19 causing injury to the left wrist.  [place] and had to walk a long distance without his cane which caused him to trip and fall.  He was seen originally in the emergency department where he was put into a sugar tong splint. The patient was seen in our office for further treatment due to continued weakness, swelling, ecchymosis, and stiffness.  Discussed the reason and rationale for surgical intervention and the use of a plate and screws. He has been in a wrist brace. He denies chest pain, shortness of breath, fever, chills, nausea, vomiting, or diarrhea.   Past Medical History:  Diagnosis Date  . Arthritis   . Cholelithiasis   . Cirrhosis ([place]) 2011   Cryptogenic, Likely NASH. Family/pt deny EtOH. HCV, HBV, HAV negative. ANA negative. AMA positive. Ascites 12/11  . Closed fracture of left distal radius   . Coronary artery disease    Inferior MI 12/11; LHC with occluded mid CFX and 80% proximal RCA. EF 55%. He had 3.0 x 28 vision BMS to CFX  . Diastolic CHF, acute ([place])    Echo 12/11 with ef 50-55% and mild LVH. EF 55% by LV0gram in 12/11  . Esophageal varices ([place]) 2011, 2013   no hx acute variceal bleed  . Hepatic encephalopathy ([place]) 2011, 12/2013  . Hyperlipemia   . Myocardial infarction [facility]) ? 2012  . PFO (patent foramen ovale): Per TEE 04/20/2015 04/20/2015  . Portal hypertension ([place])   . Rectal varices   . S/P coronary artery stent placement 06/2010  . SVT (supraventricular tachycardia) ([place])    1/12: appeared to be an ectopic atrial tachycardia. Required DCCV with hemodynamic instability  . Type II diabetes mellitus ([place]) 2011    Past Surgical History:  Procedure Laterality Date  . APPENDECTOMY    . BIOPSY  01/02/2018   Procedure: BIOPSY;  Surgeon: Jerene Bears, MD;  Location: [facility];   Service: Gastroenterology;;  . CARDIAC CATHETERIZATION  07/01/2010   BMS to CFX.  Marland Kitchen CATARACT EXTRACTION W/PHACO Left 04/28/2016   Procedure: CATARACT EXTRACTION PHACO AND INTRAOCULAR LENS PLACEMENT (IOC);  Surgeon: Leandrew Koyanagi, MD;  Location: [facility] ORS;  Service: Ophthalmology;  Laterality: Left;  Lot# [id number] H [place]: 05:31.7 AP%: 28.2 CDE: 93.48   . COLONOSCOPY  04/13/2012   Procedure: COLONOSCOPY;  Surgeon: Inda Castle, MD;  Location: [facility];  Service: Endoscopy;  Laterality: N/A;  . COLONOSCOPY WITH PROPOFOL N/A 03/22/2017   Procedure: COLONOSCOPY WITH PROPOFOL;  Surgeon: Doran Stabler, MD;  Location: [facility];  Service: Gastroenterology;  Laterality: N/A;  . COLONOSCOPY WITH PROPOFOL N/A 01/02/2018   Procedure: COLONOSCOPY WITH PROPOFOL;  Surgeon: Jerene Bears, MD;  Location: [facility];  Service: Gastroenterology;  Laterality: N/A;  . CORONARY ANGIOPLASTY    . CORONARY STENT PLACEMENT  06/30/2010   CFX   Distal        . ESOPHAGEAL BANDING N/A 01/01/2014   Procedure: ESOPHAGEAL BANDING;  Surgeon: Jerene Bears, MD;  Location: [facility];  Service: Endoscopy;  Laterality: N/A;  . ESOPHAGOGASTRODUODENOSCOPY  04/13/2012   Procedure: ESOPHAGOGASTRODUODENOSCOPY (EGD);  Surgeon: Inda Castle, MD;  Location: [facility];  Service: Endoscopy;  Laterality: N/A;  . ESOPHAGOGASTRODUODENOSCOPY N/A 01/01/2014   Procedure: ESOPHAGOGASTRODUODENOSCOPY (EGD);  Surgeon: Jerene Bears, MD;  Location: [facility] ENDOSCOPY;  Service: Endoscopy;  Laterality: N/A;  . ESOPHAGOGASTRODUODENOSCOPY (EGD) WITH PROPOFOL N/A 07/12/2016   Procedure: ESOPHAGOGASTRODUODENOSCOPY (EGD) WITH PROPOFOL;  Surgeon: Jerene Bears, MD;  Location: [facility];  Service: Gastroenterology;  Laterality: N/A;  . ESOPHAGOGASTRODUODENOSCOPY (EGD) WITH PROPOFOL N/A 01/02/2018   Procedure: ESOPHAGOGASTRODUODENOSCOPY (EGD) WITH PROPOFOL;  Surgeon: Jerene Bears, MD;  Location: [facility];  Service: Gastroenterology;   Laterality: N/A;  . LEFT HEART CATHETERIZATION WITH CORONARY ANGIOGRAM N/A 10/15/2014   Procedure: LEFT HEART CATHETERIZATION WITH CORONARY ANGIOGRAM;  Surgeon: Peter M Martinique, MD;  Location: [facility] CATH LAB;  Service: Cardiovascular;  Laterality: N/A;  . orif r leg    . POLYPECTOMY  01/02/2018   Procedure: POLYPECTOMY;  Surgeon: Jerene Bears, MD;  Location: [facility];  Service: Gastroenterology;;  . TEE WITHOUT CARDIOVERSION N/A 04/20/2015   Procedure: TRANSESOPHAGEAL ECHOCARDIOGRAM (TEE);  Surgeon: Fay Records, MD;  Location: [facility];  Service: Cardiovascular;  Laterality: N/A;  . TEE WITHOUT CARDIOVERSION N/A 05/17/2017   Procedure: TRANSESOPHAGEAL ECHOCARDIOGRAM (TEE);  Surgeon: Josue Hector, MD;  Location: [facility] ENDOSCOPY;  Service: Cardiovascular;  Laterality: N/A;    Family History  Problem Relation Age of Onset  . Heart attack Brother 54       MI  . Diabetes Brother   . Heart attack Father   . Diabetes Father   . COPD Father   . COPD Mother   . Heart attack Brother   . Stroke Neg Hx    Social History:  reports that he has been smoking cigarettes. He has a 9.00 pack-year smoking history. He has never used smokeless tobacco. He reports that he does not drink alcohol or use drugs.  Allergies:  Allergies  Allergen Reactions  . Isosorbide Nitrate Other (See Comments)    Nose bleeds    No medications prior to admission.    No results found for this or any previous visit (from the past 48 hour(s)). No results found.  ROS NO RECENT ILLNESSES OR HOSPITALIZATIONS  Height 5' 6"  (1.676 m), weight 115.3 kg. Physical Exam  General Appearance:  Alert, cooperative, no distress, appears stated age  Head:  Normocephalic, without obvious abnormality, atraumatic  Eyes:  Pupils equal, conjunctiva/corneas clear,         Throat: Lips, mucosa, and tongue normal; teeth and gums normal  Neck: No visible masses     Lungs:   respirations unlabored  Chest Wall:  No tenderness or  deformity  Heart:  Regular rate and rhythm,  Abdomen:   Soft, non-tender,         Extremities: LUE - MODERATE SWELLING AND ECCHYMOSIS OF THE LEFT FOREARM WITH NO ERYTHEMA, OPEN WOUNDS, OR DRAINAGE. SENSATION INTACT TO LIGHT TOUCH DISTALLY. CAPILLARY REFILL LESS THAN 2 SECONDS. ABLE TO WIGGLE ALL DIGITS WITH MILD DIFFICULTY.  Pulses: 2+ and symmetric  Skin: Skin color, texture, turgor normal, no rashes or lesions     Neurologic: Normal    Assessment LEFT DISTAL RADIUS INTRA-ARTICULAR FRACTURE    Plan LEFT DISTAL RADIUS OPEN REDUCTION AND INTERNAL FIXATION WITH REPAIR AS INDICATED  R/B/A DISCUSSED WITH PT IN OFFICE.  PT VOICED UNDERSTANDING OF PLAN CONSENT SIGNED DAY OF SURGERY PT SEEN AND EXAMINED PRIOR TO OPERATIVE PROCEDURE/DAY OF SURGERY SITE MARKED. QUESTIONS ANSWERED WILL GO HOME FOLLOWING SURGERY  WE ARE PLANNING SURGERY FOR YOUR UPPER EXTREMITY. THE RISKS AND BENEFITS OF SURGERY INCLUDE BUT NOT LIMITED TO BLEEDING INFECTION, DAMAGE TO NEARBY NERVES ARTERIES TENDONS, FAILURE OF SURGERY TO ACCOMPLISH ITS INTENDED GOALS,  PERSISTENT SYMPTOMS AND NEED FOR FURTHER SURGICAL INTERVENTION. WITH THIS IN MIND WE WILL PROCEED. I HAVE DISCUSSED WITH THE PATIENT THE PRE AND POSTOPERATIVE REGIMEN AND THE DOS AND DON'TS. PT VOICED UNDERSTANDING AND INFORMED CONSENT SIGNED.  Iran Planas MD 04/17/19  Brynda Peon 04/16/2019, 8:37 AM

## 2019-04-16 NOTE — Progress Notes (Signed)
Pt denies any acute cardiopulmonary issues. Pt stated that he is under the care of Dr. Irish Lack, Cardiology and Dr. Jenna Luo, PCP. Pt made aware to stop taking vitamins, fish oil and herbal medications. Do not take any NSAIDs ie: Ibuprofen, Advil, Naproxen (Aleve), Motrin, BC and Goody Powder. Pt does not take Trulicity on Wednesday's. Pt made aware to take 7 units of Novolin 70/30 insulin at HS and no insulin DOS. Pt made aware to check CBG every 2 hours prior to arrival to hospital on DOS. Pt made aware to treat a CBG < 70 with 4 ounces of apple or cranberry juice, wait 15 minutes after intervention to recheck CBG, if CBG remains < 70, call Short Stay unit to speak with a nurse. Pt verbalized understanding of all pre-op instructions.

## 2019-04-17 ENCOUNTER — Ambulatory Visit (HOSPITAL_COMMUNITY): Payer: PPO | Admitting: Certified Registered Nurse Anesthetist

## 2019-04-17 ENCOUNTER — Ambulatory Visit (HOSPITAL_COMMUNITY)
Admission: RE | Admit: 2019-04-17 | Discharge: 2019-04-17 | Disposition: A | Discharge status: Home / Self Care | Payer: PPO | Attending: Orthopedic Surgery | Admitting: Orthopedic Surgery

## 2019-04-17 ENCOUNTER — Encounter (HOSPITAL_COMMUNITY): Payer: Self-pay | Admitting: Orthopedic Surgery

## 2019-04-17 ENCOUNTER — Encounter (HOSPITAL_COMMUNITY)
Admission: RE | Disposition: A | Discharge status: Home / Self Care | Payer: Self-pay | Source: Home / Self Care | Attending: Orthopedic Surgery

## 2019-04-17 DIAGNOSIS — K219 Gastro-esophageal reflux disease without esophagitis: Secondary | ICD-10-CM | POA: Diagnosis not present

## 2019-04-17 DIAGNOSIS — S52592A Other fractures of lower end of left radius, initial encounter for closed fracture: Secondary | ICD-10-CM | POA: Diagnosis not present

## 2019-04-17 DIAGNOSIS — F17210 Nicotine dependence, cigarettes, uncomplicated: Secondary | ICD-10-CM | POA: Diagnosis not present

## 2019-04-17 DIAGNOSIS — I252 Old myocardial infarction: Secondary | ICD-10-CM | POA: Diagnosis not present

## 2019-04-17 DIAGNOSIS — S52502D Unspecified fracture of the lower end of left radius, subsequent encounter for closed fracture with routine healing: Secondary | ICD-10-CM

## 2019-04-17 DIAGNOSIS — Z955 Presence of coronary angioplasty implant and graft: Secondary | ICD-10-CM | POA: Diagnosis not present

## 2019-04-17 DIAGNOSIS — W010XXA Fall on same level from slipping, tripping and stumbling without subsequent striking against object, initial encounter: Secondary | ICD-10-CM | POA: Diagnosis not present

## 2019-04-17 DIAGNOSIS — K7460 Unspecified cirrhosis of liver: Secondary | ICD-10-CM | POA: Insufficient documentation

## 2019-04-17 DIAGNOSIS — I5032 Chronic diastolic (congestive) heart failure: Secondary | ICD-10-CM | POA: Diagnosis not present

## 2019-04-17 DIAGNOSIS — Z794 Long term (current) use of insulin: Secondary | ICD-10-CM | POA: Diagnosis not present

## 2019-04-17 DIAGNOSIS — E119 Type 2 diabetes mellitus without complications: Secondary | ICD-10-CM | POA: Diagnosis not present

## 2019-04-17 DIAGNOSIS — Z79899 Other long term (current) drug therapy: Secondary | ICD-10-CM | POA: Insufficient documentation

## 2019-04-17 DIAGNOSIS — Z20828 Contact with and (suspected) exposure to other viral communicable diseases: Secondary | ICD-10-CM | POA: Diagnosis not present

## 2019-04-17 DIAGNOSIS — S52572A Other intraarticular fracture of lower end of left radius, initial encounter for closed fracture: Secondary | ICD-10-CM | POA: Insufficient documentation

## 2019-04-17 DIAGNOSIS — I2510 Atherosclerotic heart disease of native coronary artery without angina pectoris: Secondary | ICD-10-CM | POA: Diagnosis not present

## 2019-04-17 HISTORY — PX: OPEN REDUCTION INTERNAL FIXATION (ORIF) DISTAL RADIAL FRACTURE: SHX5989

## 2019-04-17 HISTORY — DX: Presence of spectacles and contact lenses: Z97.3

## 2019-04-17 HISTORY — DX: Unspecified fracture of the lower end of left radius, initial encounter for closed fracture: S52.502A

## 2019-04-17 LAB — GLUCOSE, CAPILLARY
Glucose-Capillary: 149 mg/dL — ABNORMAL HIGH (ref 70–99)
Glucose-Capillary: 118 mg/dL — ABNORMAL HIGH (ref 70–99)

## 2019-04-17 LAB — SARS CORONAVIRUS 2 BY RT PCR (HOSPITAL ORDER, PERFORMED IN ~~LOC~~ HOSPITAL LAB): SARS Coronavirus 2: NEGATIVE

## 2019-04-17 SURGERY — OPEN REDUCTION INTERNAL FIXATION (ORIF) DISTAL RADIUS FRACTURE
Anesthesia: Monitor Anesthesia Care | Site: Wrist | Laterality: Left

## 2019-04-17 MED ORDER — CEFAZOLIN SODIUM-DEXTROSE 2-4 GM/100ML-% IV SOLN
2.0000 g | INTRAVENOUS | Status: AC
  Administered 2019-04-17: 14:00:00 2 g via INTRAVENOUS

## 2019-04-17 MED ORDER — MIDAZOLAM HCL 2 MG/2ML IJ SOLN
INTRAMUSCULAR | Status: DC | PRN
  Administered 2019-04-17 (×2): 1 mg via INTRAVENOUS

## 2019-04-17 MED ORDER — FENTANYL CITRATE (PF) 250 MCG/5ML IJ SOLN
INTRAMUSCULAR | Status: AC
  Filled 2019-04-17: qty 5

## 2019-04-17 MED ORDER — PROMETHAZINE HCL 25 MG/ML IJ SOLN: 6.2500 mg | INTRAMUSCULAR | Status: DC | PRN

## 2019-04-17 MED ORDER — 0.9 % SODIUM CHLORIDE (POUR BTL) OPTIME
TOPICAL | Status: DC | PRN
  Administered 2019-04-17: 14:00:00 1000 mL

## 2019-04-17 MED ORDER — LIDOCAINE 2% (20 MG/ML) 5 ML SYRINGE
INTRAMUSCULAR | Status: DC | PRN
  Administered 2019-04-17: 50 mg via INTRAVENOUS

## 2019-04-17 MED ORDER — FENTANYL CITRATE (PF) 100 MCG/2ML IJ SOLN
INTRAMUSCULAR | Status: DC | PRN
  Administered 2019-04-17 (×2): 50 ug via INTRAVENOUS

## 2019-04-17 MED ORDER — PHENYLEPHRINE 40 MCG/ML (10ML) SYRINGE FOR IV PUSH (FOR BLOOD PRESSURE SUPPORT)
PREFILLED_SYRINGE | INTRAVENOUS | Status: DC | PRN
  Administered 2019-04-17: 80 ug via INTRAVENOUS

## 2019-04-17 MED ORDER — MIDAZOLAM HCL 2 MG/2ML IJ SOLN
INTRAMUSCULAR | Status: AC
  Filled 2019-04-17: qty 2

## 2019-04-17 MED ORDER — OXYCODONE HCL 5 MG PO TABS: 5.0000 mg | ORAL_TABLET | Freq: Once | ORAL | Status: DC | PRN

## 2019-04-17 MED ORDER — BUPIVACAINE HCL (PF) 0.25 % IJ SOLN: INTRAMUSCULAR | Status: DC | PRN

## 2019-04-17 MED ORDER — DEXAMETHASONE SODIUM PHOSPHATE 10 MG/ML IJ SOLN
INTRAMUSCULAR | Status: DC | PRN
  Administered 2019-04-17: 4 mg via INTRAVENOUS

## 2019-04-17 MED ORDER — ONDANSETRON HCL 4 MG/2ML IJ SOLN
INTRAMUSCULAR | Status: DC | PRN
  Administered 2019-04-17: 4 mg via INTRAVENOUS

## 2019-04-17 MED ORDER — OXYCODONE HCL 5 MG/5ML PO SOLN: 5.0000 mg | Freq: Once | ORAL | Status: DC | PRN

## 2019-04-17 MED ORDER — BUPIVACAINE HCL (PF) 0.25 % IJ SOLN
INTRAMUSCULAR | Status: AC
  Filled 2019-04-17: qty 30

## 2019-04-17 MED ORDER — LACTATED RINGERS IV SOLN
INTRAVENOUS | Status: DC
  Administered 2019-04-17 (×2): via INTRAVENOUS

## 2019-04-17 MED ORDER — HYDROMORPHONE HCL 1 MG/ML IJ SOLN: 0.2500 mg | INTRAMUSCULAR | Status: DC | PRN

## 2019-04-17 MED ORDER — ROPIVACAINE HCL 5 MG/ML IJ SOLN
INTRAMUSCULAR | Status: DC | PRN
  Administered 2019-04-17: 30 mL via PERINEURAL

## 2019-04-17 MED ORDER — PROPOFOL 500 MG/50ML IV EMUL
INTRAVENOUS | Status: DC | PRN
  Administered 2019-04-17: 50 ug/kg/min via INTRAVENOUS

## 2019-04-17 MED ORDER — CHLORHEXIDINE GLUCONATE 4 % EX LIQD: 60.0000 mL | Freq: Once | CUTANEOUS | Status: DC

## 2019-04-17 MED ORDER — CEFAZOLIN SODIUM-DEXTROSE 2-4 GM/100ML-% IV SOLN
INTRAVENOUS | Status: AC
  Filled 2019-04-17: qty 100

## 2019-04-17 MED ORDER — EPHEDRINE 5 MG/ML INJ
INTRAVENOUS | Status: AC
  Filled 2019-04-17: qty 10

## 2019-04-17 MED ORDER — PROPOFOL 10 MG/ML IV BOLUS
INTRAVENOUS | Status: DC | PRN
  Administered 2019-04-17 (×3): 30 mg via INTRAVENOUS

## 2019-04-17 MED ORDER — FENTANYL CITRATE (PF) 100 MCG/2ML IJ SOLN
INTRAMUSCULAR | Status: AC
  Filled 2019-04-17: qty 2

## 2019-04-17 MED ORDER — PHENYLEPHRINE HCL (PRESSORS) 10 MG/ML IV SOLN
INTRAVENOUS | Status: AC
  Filled 2019-04-17: qty 1

## 2019-04-17 MED ORDER — LIDOCAINE-EPINEPHRINE 1 %-1:100000 IJ SOLN
INTRAMUSCULAR | Status: AC
  Filled 2019-04-17: qty 1

## 2019-04-17 MED ORDER — EPHEDRINE SULFATE-NACL 50-0.9 MG/10ML-% IV SOSY
PREFILLED_SYRINGE | INTRAVENOUS | Status: DC | PRN
  Administered 2019-04-17 (×3): 5 mg via INTRAVENOUS

## 2019-04-17 MED ORDER — PHENYLEPHRINE 40 MCG/ML (10ML) SYRINGE FOR IV PUSH (FOR BLOOD PRESSURE SUPPORT)
PREFILLED_SYRINGE | INTRAVENOUS | Status: AC
  Filled 2019-04-17: qty 10

## 2019-04-17 MED ORDER — SODIUM CHLORIDE 0.9 % IV SOLN
INTRAVENOUS | Status: DC | PRN
  Administered 2019-04-17: 15:00:00 40 ug/min via INTRAVENOUS

## 2019-04-17 MED ORDER — ONDANSETRON HCL 4 MG/2ML IJ SOLN
INTRAMUSCULAR | Status: AC
  Filled 2019-04-17: qty 2

## 2019-04-17 MED ORDER — DEXAMETHASONE SODIUM PHOSPHATE 10 MG/ML IJ SOLN
INTRAMUSCULAR | Status: AC
  Filled 2019-04-17: qty 1

## 2019-04-17 MED ORDER — LIDOCAINE 2% (20 MG/ML) 5 ML SYRINGE
INTRAMUSCULAR | Status: AC
  Filled 2019-04-17: qty 5

## 2019-04-17 SURGICAL SUPPLY — 73 items
BIT DRILL 2.2 SS TIBIAL (BIT) ×1 IMPLANT
BLADE CLIPPER SURG (BLADE) IMPLANT
BLADE SURG 15 STRL LF DISP TIS (BLADE) IMPLANT
BLADE SURG 15 STRL SS (BLADE) ×2
BNDG CMPR 9X4 STRL LF SNTH (GAUZE/BANDAGES/DRESSINGS) ×1
BNDG ELASTIC 2X5.8 VLCR STR LF (GAUZE/BANDAGES/DRESSINGS) ×1 IMPLANT
BNDG ELASTIC 3X5.8 VLCR STR LF (GAUZE/BANDAGES/DRESSINGS) ×2 IMPLANT
BNDG ELASTIC 4X5.8 VLCR STR LF (GAUZE/BANDAGES/DRESSINGS) ×2 IMPLANT
BNDG ESMARK 4X9 LF (GAUZE/BANDAGES/DRESSINGS) ×2 IMPLANT
BNDG GAUZE ELAST 4 BULKY (GAUZE/BANDAGES/DRESSINGS) ×2 IMPLANT
CANISTER SUCT 3000ML PPV (MISCELLANEOUS) ×2 IMPLANT
CORD BIPOLAR FORCEPS 12FT (ELECTRODE) ×2 IMPLANT
COVER SURGICAL LIGHT HANDLE (MISCELLANEOUS) ×2 IMPLANT
COVER WAND RF STERILE (DRAPES) ×2 IMPLANT
CUFF TOURN SGL QUICK 18X4 (TOURNIQUET CUFF) ×2 IMPLANT
CUFF TOURN SGL QUICK 24 (TOURNIQUET CUFF)
CUFF TRNQT CYL 24X4X16.5-23 (TOURNIQUET CUFF) IMPLANT
DECANTER SPIKE VIAL GLASS SM (MISCELLANEOUS) ×2 IMPLANT
DRAPE OEC MINIVIEW 54X84 (DRAPES) ×2 IMPLANT
DRAPE SURG 17X11 SM STRL (DRAPES) ×2 IMPLANT
DRSG ADAPTIC 3X8 NADH LF (GAUZE/BANDAGES/DRESSINGS) ×2 IMPLANT
GAUZE 4X4 16PLY RFD (DISPOSABLE) ×2 IMPLANT
GAUZE SPONGE 4X4 12PLY STRL (GAUZE/BANDAGES/DRESSINGS) ×2 IMPLANT
GAUZE SPONGE 4X4 12PLY STRL LF (GAUZE/BANDAGES/DRESSINGS) ×1 IMPLANT
GAUZE SPONGE 4X4 16PLY XRAY LF (GAUZE/BANDAGES/DRESSINGS) ×1 IMPLANT
GAUZE XEROFORM 5X9 LF (GAUZE/BANDAGES/DRESSINGS) ×2 IMPLANT
GLOVE BIOGEL PI IND STRL 8.5 (GLOVE) ×1 IMPLANT
GLOVE BIOGEL PI INDICATOR 8.5 (GLOVE) ×1
GLOVE SURG ORTHO 8.0 STRL STRW (GLOVE) ×2 IMPLANT
GOWN STRL REUS W/ TWL LRG LVL3 (GOWN DISPOSABLE) ×1 IMPLANT
GOWN STRL REUS W/ TWL XL LVL3 (GOWN DISPOSABLE) ×1 IMPLANT
GOWN STRL REUS W/TWL LRG LVL3 (GOWN DISPOSABLE) ×2
GOWN STRL REUS W/TWL XL LVL3 (GOWN DISPOSABLE) ×2
K-WIRE 1.6 (WIRE) ×4
K-WIRE FX5X1.6XNS BN SS (WIRE) ×2
KIT BASIN OR (CUSTOM PROCEDURE TRAY) ×2 IMPLANT
KIT TURNOVER KIT B (KITS) ×2 IMPLANT
KWIRE FX5X1.6XNS BN SS (WIRE) IMPLANT
NDL HYPO 25GX1X1/2 BEV (NEEDLE) IMPLANT
NDL HYPO 25X1 1.5 SAFETY (NEEDLE) ×1 IMPLANT
NEEDLE HYPO 25GX1X1/2 BEV (NEEDLE) ×2 IMPLANT
NEEDLE HYPO 25X1 1.5 SAFETY (NEEDLE) ×2 IMPLANT
NS IRRIG 1000ML POUR BTL (IV SOLUTION) ×2 IMPLANT
PACK ORTHO EXTREMITY (CUSTOM PROCEDURE TRAY) ×2 IMPLANT
PAD ARMBOARD 7.5X6 YLW CONV (MISCELLANEOUS) ×4 IMPLANT
PAD CAST 4YDX4 CTTN HI CHSV (CAST SUPPLIES) ×1 IMPLANT
PADDING CAST COTTON 4X4 STRL (CAST SUPPLIES) ×2
PEG LOCKING SMOOTH 2.2X20 (Screw) ×2 IMPLANT
PEG LOCKING SMOOTH 2.2X22 (Screw) ×2 IMPLANT
PEG LOCKING SMOOTH 2.2X24 (Peg) ×3 IMPLANT
PLATE WIDE DVR LEFT (Plate) ×1 IMPLANT
SCREW LOCK 14X2.7X 3 LD TPR (Screw) IMPLANT
SCREW LOCK 16X2.7X 3 LD TPR (Screw) IMPLANT
SCREW LOCK 22X2.7X 3 LD TPR (Screw) IMPLANT
SCREW LOCKING 2.7X14 (Screw) ×4 IMPLANT
SCREW LOCKING 2.7X15MM (Screw) ×1 IMPLANT
SCREW LOCKING 2.7X16 (Screw) ×6 IMPLANT
SCREW LOCKING 2.7X22MM (Screw) ×2 IMPLANT
SCREW NONLOCK 2.7X28MM (Screw) ×1 IMPLANT
SLING ARM IMMOBILIZER XL (CAST SUPPLIES) ×1 IMPLANT
SOAP 2 % CHG 4 OZ (WOUND CARE) ×2 IMPLANT
SPONGE LAP 4X18 RFD (DISPOSABLE) ×2 IMPLANT
SUT PROLENE 3 0 PS 2 (SUTURE) ×1 IMPLANT
SUT PROLENE 4 0 PS 2 18 (SUTURE) ×1 IMPLANT
SUT VIC AB 2-0 CT1 27 (SUTURE) ×2
SUT VIC AB 2-0 CT1 TAPERPNT 27 (SUTURE) IMPLANT
SUT VICRYL 4-0 PS2 18IN ABS (SUTURE) ×1 IMPLANT
SYR CONTROL 10ML LL (SYRINGE) IMPLANT
TOWEL GREEN STERILE (TOWEL DISPOSABLE) ×2 IMPLANT
TOWEL GREEN STERILE FF (TOWEL DISPOSABLE) IMPLANT
TUBE CONNECTING 12X1/4 (SUCTIONS) ×2 IMPLANT
WATER STERILE IRR 1000ML POUR (IV SOLUTION) ×2 IMPLANT
YANKAUER SUCT BULB TIP NO VENT (SUCTIONS) IMPLANT

## 2019-04-17 NOTE — Anesthesia Postprocedure Evaluation (Signed)
Anesthesia Post Note  Patient: Adam Butler  Procedure(s) Performed: OPEN REDUCTION INTERNAL FIXATION (ORIF) DISTAL RADIAL FRACTURE (Left Wrist)     Patient location during evaluation: PACU Anesthesia Type: Regional and MAC Level of consciousness: awake and alert Pain management: pain level controlled Vital Signs Assessment: post-procedure vital signs reviewed and stable Respiratory status: spontaneous breathing, nonlabored ventilation and respiratory function stable Cardiovascular status: blood pressure returned to baseline and stable Postop Assessment: no apparent nausea or vomiting Anesthetic complications: no    Last Vitals:  Vitals:   04/17/19 1600 04/17/19 1615  BP: (!) 95/47 (!) 96/52  Pulse: 62 (!) 56  Resp: 14 19  Temp: (!) 36.3 C   SpO2: 96% 99%    Last Pain:  Vitals:   04/17/19 1600  TempSrc:   PainSc: 0-No pain                 Pervis Hocking

## 2019-04-17 NOTE — Transfer of Care (Signed)
Immediate Anesthesia Transfer of Care Note  Patient: Adam Butler  Procedure(s) Performed: OPEN REDUCTION INTERNAL FIXATION (ORIF) DISTAL RADIAL FRACTURE (Left Wrist)  Patient Location: PACU  Anesthesia Type:MAC and Regional  Level of Consciousness: awake, oriented and patient cooperative  Airway & Oxygen Therapy: Patient Spontanous Breathing and Patient connected to face mask oxygen  Post-op Assessment: Report given to RN and Post -op Vital signs reviewed and stable  Post vital signs: Reviewed and stable  Last Vitals:  Vitals Value Taken Time  BP 95/47 04/17/19 1558  Temp    Pulse 61 04/17/19 1559  Resp 16 04/17/19 1559  SpO2 98 % 04/17/19 1559  Vitals shown include unvalidated device data.  Last Pain:  Vitals:   04/17/19 1235  TempSrc:   PainSc: 4       Patients Stated Pain Goal: 3 (20/94/70 9628)  Complications: No apparent anesthesia complications

## 2019-04-17 NOTE — Op Note (Signed)
PREOPERATIVE DIAGNOSIS: Left wrist intra-articular distal radius fracture 3 more fragments  POSTOPERATIVE DIAGNOSIS: Same  ATTENDING SURGEON: Dr. Iran Planas who is scrubbed and present for the entire procedure  ASSISTANT SURGEON: Gertie Fey, PA-C was scrubbed and necessary to aid in reduction open reduction internal fixation placement of the fixation closure and splinting in a timely fashion  ANESTHESIA: Regional with IV sedation  OPERATIVE PROCEDURE: #1: Open treatment of left wrist intra-articular distal radius fracture 3 more fragments #2: Left wrist brachioradialis tendon release, tendon tenotomy #3: Radiographs 3 views left wrist  IMPLANTS: [employer]omet DVR wide cross lock  RADIOGRAPHIC INTERPRETATION: AP lateral oblique views of the wrist do show the volar plate fixation place with good alignment in both planes  SURGICAL INDICATIONS: Patient is a right-hand-dominant male who sustained a comminuted intra-articular distal radius fracture.  Patient was seen evaluate the office and recommended undergo the above procedure.  The risks of surgery include but not limited to bleeding infection damage nearby nerves arteries or tendons loss of motion of the wrist and digits incomplete relief of symptoms and need for further surgical intervention.  SURGICAL TECHNIQUE: Patient was palpated via the preoperative holding area marked the permanent marker made on left wrist indicate correct operative site.  Patient brought back to operating placed supine on the anesthesia table where the regional anesthetic and been administered.  Preoperative antibiotics were given prior any skin incision.  Well-padded tourniquet was then placed on the left brachium seal with the appropriate drape.  Left upper extremities then prepped and draped in the normal sterile fashion.  A timeout was called the correct site was identified the procedure then begun.  Attention then turned to the left wrist.  A longitudinal incision  made directly over the FCR sheath.  Dissection carried down through the skin and subcutaneous tissue.  The FCR sheath was opened proximally distally and released.  Going through the floor the FCR sheath deep dissection carried all the way down to the pronator quadratus.  An L-shaped pronator quadratus flap was then elevated the fracture site was then exposed.  This was a comminuted intra-articular fracture of 3 more fragments.  The pronator quadratus was retracted all the way to allow for exposure.  The brachioradialis was then carefully released off the radial styloid to reduce the radial column.  There were several fracture lines extending into the radial column.  Tendon tenotomy was done to release the radial column.  After this the fracture site was thoroughly evacuated the hematoma.  The volar plate was then applied and held distally with K wires.  Its position was then confirmed using the mini C arm.  Following this the oblong screw hole was placed proximally.  Plate height was then adjusted.  Following this distal fixation was carried out with a combination of distal locking pegs and screws.  Final shaft fixation was carried out with a combination of locking nonlocking screws.  Final radiographs were then obtained using the mini C arm.  The wound was then thoroughly irrigated.  The pronator quadratus was then closed with 2-0 Vicryl suture subcutaneous tissues closed with 4-0 Vicryl and skin closed with simple 3-0 Prolene suture.  Xeroform and Adaptic dressing applied.  Patient is and placed in a well-padded sugar tong splint taken recovery in good condition.  POSTOPERATIVE PLAN: Patient be discharged to home.  See him back in the office in 2 weeks for wound check suture removal x-rays application of a short arm cast.  Begin a therapy regimen the  4-week mark.  Radiographs at each visit.

## 2019-04-17 NOTE — Discharge Instructions (Signed)
KEEP BANDAGE CLEAN AND DRY CALL OFFICE FOR F/U APPT [phone] IN 15 DAYS RX SENT TO [facility] IN [place] KEEP HAND ELEVATED ABOVE HEART OK TO APPLY ICE TO OPERATIVE AREA CONTACT OFFICE IF ANY WORSENING PAIN OR CONCERNS.

## 2019-04-17 NOTE — Anesthesia Preprocedure Evaluation (Signed)
Anesthesia Evaluation  Patient identified by MRN, date of birth, ID band Patient awake    Reviewed: Allergy & Precautions, NPO status , Patient's Chart, lab work & pertinent test results  Airway Mallampati: II  TM Distance: >3 FB Neck ROM: Full    Dental  (+) Dental Advisory Given, Missing   Pulmonary Current Smoker,    Pulmonary exam normal breath sounds clear to auscultation       Cardiovascular + CAD, + Past MI and + Cardiac Stents  Normal cardiovascular exam Rhythm:Regular Rate:Normal  PFO   Neuro/Psych negative neurological ROS  negative psych ROS   GI/Hepatic GERD  ,(+) Cirrhosis   Esophageal Varices    ,   Endo/Other  diabetes, Type 2  Renal/GU negative Renal ROS  negative genitourinary   Musculoskeletal  (+) Arthritis , Osteoarthritis,    Abdominal   Peds negative pediatric ROS (+)  Hematology negative hematology ROS (+)   Anesthesia Other Findings   Reproductive/Obstetrics negative OB ROS                             Anesthesia Physical  Anesthesia Plan  ASA: III  Anesthesia Plan: MAC and Regional   Post-op Pain Management:    Induction: Intravenous  PONV Risk Score and Plan: 1 and Treatment may vary due to age or medical condition and Ondansetron  Airway Management Planned: Simple Face Mask  Additional Equipment:   Intra-op Plan:   Post-operative Plan:   Informed Consent: I have reviewed the patients History and Physical, chart, labs and discussed the procedure including the risks, benefits and alternatives for the proposed anesthesia with the patient or authorized representative who has indicated his/her understanding and acceptance.     Dental advisory given  Plan Discussed with: CRNA and Surgeon  Anesthesia Plan Comments:         Anesthesia Quick Evaluation

## 2019-04-17 NOTE — Anesthesia Procedure Notes (Signed)
Anesthesia Regional Block: Supraclavicular block   Pre-Anesthetic Checklist: ,, timeout performed, Correct Patient, Correct Site, Correct Laterality, Correct Procedure, Correct Position, site marked, Risks and benefits discussed,  Surgical consent,  Pre-op evaluation,  At surgeon's request and post-op pain management  Laterality: Left  Prep: chloraprep       Needles:  Injection technique: Single-shot  Needle Type: Stimiplex     Needle Length: 9cm  Needle Gauge: 21     Additional Needles:   Procedures:,,,, ultrasound used (permanent image in chart),,,,  Narrative:  Start time: 04/17/2019 2:11 PM End time: 04/17/2019 2:16 PM Injection made incrementally with aspirations every 5 mL.  Performed by: Personally  Anesthesiologist: Lynda Rainwater, MD

## 2019-04-18 ENCOUNTER — Encounter (HOSPITAL_COMMUNITY): Payer: Self-pay | Admitting: Orthopedic Surgery

## 2019-04-19 NOTE — Telephone Encounter (Signed)
Message from [name] Key: [id number] - PA Case ID: [id number] - Rx #: [id number] Archived on August 24 Outcome  Denied on August 24  PA Case: [id number], Status: Denied. Notification: Completed.

## 2019-05-07 DIAGNOSIS — Z4789 Encounter for other orthopedic aftercare: Secondary | ICD-10-CM | POA: Diagnosis not present

## 2019-05-07 DIAGNOSIS — S52572D Other intraarticular fracture of lower end of left radius, subsequent encounter for closed fracture with routine healing: Secondary | ICD-10-CM | POA: Diagnosis not present

## 2019-05-21 ENCOUNTER — Other Ambulatory Visit: Payer: Self-pay | Admitting: Family Medicine

## 2019-05-24 DIAGNOSIS — Z4789 Encounter for other orthopedic aftercare: Secondary | ICD-10-CM | POA: Diagnosis not present

## 2019-05-24 DIAGNOSIS — S52572D Other intraarticular fracture of lower end of left radius, subsequent encounter for closed fracture with routine healing: Secondary | ICD-10-CM | POA: Diagnosis not present

## 2019-05-28 ENCOUNTER — Other Ambulatory Visit: Payer: Self-pay | Admitting: Family Medicine

## 2019-06-21 DIAGNOSIS — S52572D Other intraarticular fracture of lower end of left radius, subsequent encounter for closed fracture with routine healing: Secondary | ICD-10-CM | POA: Diagnosis not present

## 2019-06-21 DIAGNOSIS — Z4789 Encounter for other orthopedic aftercare: Secondary | ICD-10-CM | POA: Diagnosis not present

## 2019-07-02 DIAGNOSIS — K7460 Unspecified cirrhosis of liver: Secondary | ICD-10-CM | POA: Diagnosis not present

## 2019-07-02 DIAGNOSIS — R9431 Abnormal electrocardiogram [ECG] [EKG]: Secondary | ICD-10-CM | POA: Diagnosis not present

## 2019-07-02 DIAGNOSIS — R109 Unspecified abdominal pain: Secondary | ICD-10-CM | POA: Diagnosis not present

## 2019-07-02 DIAGNOSIS — K7290 Hepatic failure, unspecified without coma: Secondary | ICD-10-CM | POA: Diagnosis not present

## 2019-07-02 DIAGNOSIS — K7210 Chronic hepatic failure without coma: Secondary | ICD-10-CM | POA: Diagnosis not present

## 2019-07-02 DIAGNOSIS — R4182 Altered mental status, unspecified: Secondary | ICD-10-CM | POA: Diagnosis not present

## 2019-07-02 DIAGNOSIS — R32 Unspecified urinary incontinence: Secondary | ICD-10-CM | POA: Diagnosis not present

## 2019-07-02 DIAGNOSIS — Z20828 Contact with and (suspected) exposure to other viral communicable diseases: Secondary | ICD-10-CM | POA: Diagnosis not present

## 2019-07-02 DIAGNOSIS — R918 Other nonspecific abnormal finding of lung field: Secondary | ICD-10-CM | POA: Diagnosis not present

## 2019-07-02 DIAGNOSIS — K5100 Ulcerative (chronic) pancolitis without complications: Secondary | ICD-10-CM | POA: Diagnosis not present

## 2019-07-02 DIAGNOSIS — E871 Hypo-osmolality and hyponatremia: Secondary | ICD-10-CM | POA: Diagnosis not present

## 2019-07-02 DIAGNOSIS — K922 Gastrointestinal hemorrhage, unspecified: Secondary | ICD-10-CM | POA: Diagnosis not present

## 2019-07-02 DIAGNOSIS — E7220 Disorder of urea cycle metabolism, unspecified: Secondary | ICD-10-CM | POA: Diagnosis not present

## 2019-07-02 DIAGNOSIS — K529 Noninfective gastroenteritis and colitis, unspecified: Secondary | ICD-10-CM | POA: Diagnosis not present

## 2019-07-02 DIAGNOSIS — R933 Abnormal findings on diagnostic imaging of other parts of digestive tract: Secondary | ICD-10-CM | POA: Diagnosis not present

## 2019-07-02 DIAGNOSIS — F17210 Nicotine dependence, cigarettes, uncomplicated: Secondary | ICD-10-CM | POA: Diagnosis not present

## 2019-07-02 DIAGNOSIS — R52 Pain, unspecified: Secondary | ICD-10-CM | POA: Diagnosis not present

## 2019-07-02 DIAGNOSIS — Z79899 Other long term (current) drug therapy: Secondary | ICD-10-CM | POA: Diagnosis not present

## 2019-07-02 DIAGNOSIS — Z9119 Patient's noncompliance with other medical treatment and regimen: Secondary | ICD-10-CM | POA: Diagnosis not present

## 2019-07-02 DIAGNOSIS — E119 Type 2 diabetes mellitus without complications: Secondary | ICD-10-CM | POA: Diagnosis not present

## 2019-07-02 DIAGNOSIS — R188 Other ascites: Secondary | ICD-10-CM | POA: Diagnosis not present

## 2019-07-02 DIAGNOSIS — I509 Heart failure, unspecified: Secondary | ICD-10-CM | POA: Diagnosis not present

## 2019-07-02 DIAGNOSIS — R0902 Hypoxemia: Secondary | ICD-10-CM | POA: Diagnosis not present

## 2019-07-02 DIAGNOSIS — I959 Hypotension, unspecified: Secondary | ICD-10-CM | POA: Diagnosis not present

## 2019-07-02 DIAGNOSIS — D696 Thrombocytopenia, unspecified: Secondary | ICD-10-CM | POA: Diagnosis not present

## 2019-07-02 DIAGNOSIS — K7200 Acute and subacute hepatic failure without coma: Secondary | ICD-10-CM | POA: Diagnosis not present

## 2019-07-02 DIAGNOSIS — E876 Hypokalemia: Secondary | ICD-10-CM | POA: Diagnosis not present

## 2019-07-02 DIAGNOSIS — R945 Abnormal results of liver function studies: Secondary | ICD-10-CM | POA: Diagnosis not present

## 2019-07-02 DIAGNOSIS — E1165 Type 2 diabetes mellitus with hyperglycemia: Secondary | ICD-10-CM | POA: Diagnosis not present

## 2019-07-02 DIAGNOSIS — R195 Other fecal abnormalities: Secondary | ICD-10-CM | POA: Diagnosis not present

## 2019-07-03 ENCOUNTER — Telehealth: Payer: Self-pay | Admitting: Internal Medicine

## 2019-07-03 NOTE — Telephone Encounter (Signed)
Called and received message that voicemail box is full and cannot accept messages at this time.

## 2019-07-03 NOTE — Telephone Encounter (Signed)
Patient is requesting to speak with nurse is currently an inpatient at [facility] says he doesn't want to do a possible colonoscopy with them he wants Dr. Hilarie Fredrickson to do it.

## 2019-07-10 NOTE — Telephone Encounter (Signed)
Unable to reach pt will await further communication from the pt

## 2019-07-18 ENCOUNTER — Telehealth: Payer: Self-pay | Admitting: Family Medicine

## 2019-07-18 NOTE — Telephone Encounter (Signed)
Called and left message for patient to follow up with him post hospitalization at [facility].

## 2019-07-23 DIAGNOSIS — R4020 Unspecified coma: Secondary | ICD-10-CM | POA: Diagnosis not present

## 2019-07-23 DIAGNOSIS — I472 Ventricular tachycardia: Secondary | ICD-10-CM | POA: Diagnosis not present

## 2019-07-23 DIAGNOSIS — I8510 Secondary esophageal varices without bleeding: Secondary | ICD-10-CM | POA: Diagnosis not present

## 2019-07-23 DIAGNOSIS — S0990XA Unspecified injury of head, initial encounter: Secondary | ICD-10-CM | POA: Diagnosis not present

## 2019-07-23 DIAGNOSIS — K7040 Alcoholic hepatic failure without coma: Secondary | ICD-10-CM | POA: Diagnosis not present

## 2019-07-23 DIAGNOSIS — R570 Cardiogenic shock: Secondary | ICD-10-CM | POA: Diagnosis not present

## 2019-07-23 DIAGNOSIS — M542 Cervicalgia: Secondary | ICD-10-CM | POA: Diagnosis not present

## 2019-07-23 DIAGNOSIS — Z515 Encounter for palliative care: Secondary | ICD-10-CM | POA: Diagnosis not present

## 2019-07-23 DIAGNOSIS — L03116 Cellulitis of left lower limb: Secondary | ICD-10-CM | POA: Diagnosis not present

## 2019-07-23 DIAGNOSIS — M25561 Pain in right knee: Secondary | ICD-10-CM | POA: Diagnosis not present

## 2019-07-23 DIAGNOSIS — K7031 Alcoholic cirrhosis of liver with ascites: Secondary | ICD-10-CM | POA: Diagnosis not present

## 2019-07-23 DIAGNOSIS — J9600 Acute respiratory failure, unspecified whether with hypoxia or hypercapnia: Secondary | ICD-10-CM | POA: Diagnosis not present

## 2019-07-23 DIAGNOSIS — J189 Pneumonia, unspecified organism: Secondary | ICD-10-CM | POA: Diagnosis not present

## 2019-07-23 DIAGNOSIS — S199XXA Unspecified injury of neck, initial encounter: Secondary | ICD-10-CM | POA: Diagnosis not present

## 2019-07-23 DIAGNOSIS — Z4682 Encounter for fitting and adjustment of non-vascular catheter: Secondary | ICD-10-CM | POA: Diagnosis not present

## 2019-07-23 DIAGNOSIS — R109 Unspecified abdominal pain: Secondary | ICD-10-CM | POA: Diagnosis not present

## 2019-07-23 DIAGNOSIS — K5781 Diverticulitis of intestine, part unspecified, with perforation and abscess with bleeding: Secondary | ICD-10-CM | POA: Diagnosis not present

## 2019-07-23 DIAGNOSIS — I5032 Chronic diastolic (congestive) heart failure: Secondary | ICD-10-CM | POA: Diagnosis not present

## 2019-07-23 DIAGNOSIS — E8809 Other disorders of plasma-protein metabolism, not elsewhere classified: Secondary | ICD-10-CM | POA: Diagnosis not present

## 2019-07-23 DIAGNOSIS — I130 Hypertensive heart and chronic kidney disease with heart failure and stage 1 through stage 4 chronic kidney disease, or unspecified chronic kidney disease: Secondary | ICD-10-CM | POA: Diagnosis not present

## 2019-07-23 DIAGNOSIS — I213 ST elevation (STEMI) myocardial infarction of unspecified site: Secondary | ICD-10-CM | POA: Diagnosis not present

## 2019-07-23 DIAGNOSIS — R0902 Hypoxemia: Secondary | ICD-10-CM | POA: Diagnosis not present

## 2019-07-23 DIAGNOSIS — E872 Acidosis: Secondary | ICD-10-CM | POA: Diagnosis not present

## 2019-07-23 DIAGNOSIS — M25562 Pain in left knee: Secondary | ICD-10-CM | POA: Diagnosis not present

## 2019-07-23 DIAGNOSIS — E870 Hyperosmolality and hypernatremia: Secondary | ICD-10-CM | POA: Diagnosis not present

## 2019-07-23 DIAGNOSIS — R52 Pain, unspecified: Secondary | ICD-10-CM | POA: Diagnosis not present

## 2019-07-23 DIAGNOSIS — M79662 Pain in left lower leg: Secondary | ICD-10-CM | POA: Diagnosis not present

## 2019-07-23 DIAGNOSIS — M6282 Rhabdomyolysis: Secondary | ICD-10-CM | POA: Diagnosis not present

## 2019-07-23 DIAGNOSIS — M79661 Pain in right lower leg: Secondary | ICD-10-CM | POA: Diagnosis not present

## 2019-07-23 DIAGNOSIS — W19XXXA Unspecified fall, initial encounter: Secondary | ICD-10-CM | POA: Diagnosis not present

## 2019-07-23 DIAGNOSIS — E1122 Type 2 diabetes mellitus with diabetic chronic kidney disease: Secondary | ICD-10-CM | POA: Diagnosis not present

## 2019-07-23 DIAGNOSIS — N179 Acute kidney failure, unspecified: Secondary | ICD-10-CM | POA: Diagnosis not present

## 2019-07-23 DIAGNOSIS — R410 Disorientation, unspecified: Secondary | ICD-10-CM | POA: Diagnosis not present

## 2019-07-23 DIAGNOSIS — R404 Transient alteration of awareness: Secondary | ICD-10-CM | POA: Diagnosis not present

## 2019-07-23 DIAGNOSIS — R5381 Other malaise: Secondary | ICD-10-CM | POA: Diagnosis not present

## 2019-07-24 DIAGNOSIS — M25561 Pain in right knee: Secondary | ICD-10-CM | POA: Diagnosis not present

## 2019-07-25 DIAGNOSIS — Z4682 Encounter for fitting and adjustment of non-vascular catheter: Secondary | ICD-10-CM | POA: Diagnosis not present

## 2019-07-25 DIAGNOSIS — R5381 Other malaise: Secondary | ICD-10-CM | POA: Diagnosis not present

## 2019-07-25 DIAGNOSIS — Z515 Encounter for palliative care: Secondary | ICD-10-CM | POA: Diagnosis not present

## 2019-07-25 DIAGNOSIS — R410 Disorientation, unspecified: Secondary | ICD-10-CM | POA: Diagnosis not present

## 2019-07-29 NOTE — Telephone Encounter (Signed)
No response from patient closing this message at this time

## 2019-08-12 DEATH — deceased
